# Patient Record
Sex: Female | Born: 1965
Health system: Southern US, Community
[De-identification: ages and names within clinical notes are randomized; demographics above are authoritative.]

## PROBLEM LIST (undated history)

## (undated) DIAGNOSIS — J449 Chronic obstructive pulmonary disease, unspecified: Secondary | ICD-10-CM

## (undated) DIAGNOSIS — K743 Primary biliary cirrhosis: Secondary | ICD-10-CM

## (undated) DIAGNOSIS — I499 Cardiac arrhythmia, unspecified: Secondary | ICD-10-CM

## (undated) DIAGNOSIS — J189 Pneumonia, unspecified organism: Secondary | ICD-10-CM

## (undated) DIAGNOSIS — Z8669 Personal history of other diseases of the nervous system and sense organs: Secondary | ICD-10-CM

## (undated) DIAGNOSIS — E119 Type 2 diabetes mellitus without complications: Secondary | ICD-10-CM

## (undated) DIAGNOSIS — E039 Hypothyroidism, unspecified: Secondary | ICD-10-CM

## (undated) DIAGNOSIS — K000 Anodontia: Secondary | ICD-10-CM

## (undated) DIAGNOSIS — D72819 Decreased white blood cell count, unspecified: Secondary | ICD-10-CM

## (undated) DIAGNOSIS — J849 Interstitial pulmonary disease, unspecified: Secondary | ICD-10-CM

## (undated) DIAGNOSIS — Z9289 Personal history of other medical treatment: Secondary | ICD-10-CM

## (undated) DIAGNOSIS — Z973 Presence of spectacles and contact lenses: Secondary | ICD-10-CM

## (undated) DIAGNOSIS — J302 Other seasonal allergic rhinitis: Secondary | ICD-10-CM

## (undated) DIAGNOSIS — Q393 Congenital stenosis and stricture of esophagus: Secondary | ICD-10-CM

## (undated) DIAGNOSIS — R0602 Shortness of breath: Secondary | ICD-10-CM

## (undated) DIAGNOSIS — F419 Anxiety disorder, unspecified: Secondary | ICD-10-CM

## (undated) DIAGNOSIS — Z87442 Personal history of urinary calculi: Secondary | ICD-10-CM

## (undated) DIAGNOSIS — J9601 Acute respiratory failure with hypoxia: Secondary | ICD-10-CM

## (undated) DIAGNOSIS — M1990 Unspecified osteoarthritis, unspecified site: Secondary | ICD-10-CM

## (undated) DIAGNOSIS — R509 Fever, unspecified: Secondary | ICD-10-CM

## (undated) DIAGNOSIS — N189 Chronic kidney disease, unspecified: Secondary | ICD-10-CM

## (undated) DIAGNOSIS — K08109 Complete loss of teeth, unspecified cause, unspecified class: Secondary | ICD-10-CM

## (undated) DIAGNOSIS — G40909 Epilepsy, unspecified, not intractable, without status epilepticus: Secondary | ICD-10-CM

## (undated) DIAGNOSIS — S0990XA Unspecified injury of head, initial encounter: Secondary | ICD-10-CM

## (undated) DIAGNOSIS — K219 Gastro-esophageal reflux disease without esophagitis: Secondary | ICD-10-CM

## (undated) DIAGNOSIS — R011 Cardiac murmur, unspecified: Secondary | ICD-10-CM

## (undated) DIAGNOSIS — R51 Headache: Secondary | ICD-10-CM

## (undated) DIAGNOSIS — I864 Gastric varices: Secondary | ICD-10-CM

## (undated) HISTORY — PX: ABDOMINAL HYSTERECTOMY: SHX81

## (undated) HISTORY — DX: Gastro-esophageal reflux disease without esophagitis: K21.9

## (undated) HISTORY — DX: Other seasonal allergic rhinitis: J30.2

## (undated) HISTORY — PX: NOVASURE ABLATION: SHX5394

## (undated) HISTORY — DX: Gastric varices: I86.4

## (undated) HISTORY — PX: WISDOM TOOTH EXTRACTION: SHX21

## (undated) HISTORY — DX: Hypothyroidism, unspecified: E03.9

## (undated) HISTORY — DX: Primary biliary cirrhosis: K74.3

## (undated) HISTORY — PX: DILATION AND CURETTAGE OF UTERUS: SHX78

## (undated) HISTORY — DX: Unspecified osteoarthritis, unspecified site: M19.90

## (undated) HISTORY — PX: DENTAL SURGERY: SHX609

## (undated) HISTORY — DX: Chronic obstructive pulmonary disease, unspecified: J44.9

---
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
## 1997-09-07 ENCOUNTER — Ambulatory Visit (HOSPITAL_COMMUNITY): Admission: RE | Admit: 1997-09-07 | Discharge: 1997-09-07 | Payer: Self-pay | Admitting: Obstetrics

## 1997-10-11 ENCOUNTER — Ambulatory Visit (HOSPITAL_COMMUNITY): Admission: RE | Admit: 1997-10-11 | Discharge: 1997-10-11 | Payer: Self-pay | Admitting: Obstetrics

## 1997-10-18 ENCOUNTER — Ambulatory Visit (HOSPITAL_COMMUNITY): Admission: RE | Admit: 1997-10-18 | Discharge: 1997-10-18 | Payer: Self-pay | Admitting: Obstetrics

## 1997-11-07 ENCOUNTER — Ambulatory Visit (HOSPITAL_COMMUNITY): Admission: RE | Admit: 1997-11-07 | Discharge: 1997-11-07 | Payer: Self-pay | Admitting: Obstetrics

## 1997-11-24 ENCOUNTER — Emergency Department (HOSPITAL_COMMUNITY): Admission: EM | Admit: 1997-11-24 | Discharge: 1997-11-24 | Payer: Self-pay | Admitting: Emergency Medicine

## 1997-11-27 ENCOUNTER — Encounter: Admission: RE | Admit: 1997-11-27 | Discharge: 1998-02-25 | Payer: Self-pay | Admitting: Orthopedic Surgery

## 1997-11-29 ENCOUNTER — Inpatient Hospital Stay (HOSPITAL_COMMUNITY): Admission: AD | Admit: 1997-11-29 | Discharge: 1997-11-29 | Payer: Self-pay | Admitting: Obstetrics

## 1998-01-05 ENCOUNTER — Inpatient Hospital Stay (HOSPITAL_COMMUNITY): Admission: AD | Admit: 1998-01-05 | Discharge: 1998-01-05 | Payer: Self-pay | Admitting: Obstetrics

## 1998-02-02 ENCOUNTER — Inpatient Hospital Stay (HOSPITAL_COMMUNITY): Admission: AD | Admit: 1998-02-02 | Discharge: 1998-02-02 | Payer: Self-pay | Admitting: Obstetrics

## 1998-02-12 ENCOUNTER — Inpatient Hospital Stay (HOSPITAL_COMMUNITY): Admission: AD | Admit: 1998-02-12 | Discharge: 1998-02-16 | Payer: Self-pay | Admitting: Obstetrics

## 1998-02-12 ENCOUNTER — Inpatient Hospital Stay (HOSPITAL_COMMUNITY): Admission: AD | Admit: 1998-02-12 | Discharge: 1998-02-12 | Payer: Self-pay | Admitting: Obstetrics

## 1998-05-16 ENCOUNTER — Emergency Department (HOSPITAL_COMMUNITY): Admission: EM | Admit: 1998-05-16 | Discharge: 1998-05-16 | Payer: Self-pay | Admitting: Emergency Medicine

## 1998-05-28 ENCOUNTER — Encounter: Payer: Self-pay | Admitting: Emergency Medicine

## 1998-05-28 ENCOUNTER — Emergency Department (HOSPITAL_COMMUNITY): Admission: EM | Admit: 1998-05-28 | Discharge: 1998-05-28 | Payer: Self-pay | Admitting: Emergency Medicine

## 1998-05-29 ENCOUNTER — Encounter: Payer: Self-pay | Admitting: Emergency Medicine

## 1998-05-29 ENCOUNTER — Ambulatory Visit (HOSPITAL_COMMUNITY): Admission: RE | Admit: 1998-05-29 | Discharge: 1998-05-29 | Payer: Self-pay | Admitting: Emergency Medicine

## 1998-09-07 ENCOUNTER — Emergency Department (HOSPITAL_COMMUNITY): Admission: EM | Admit: 1998-09-07 | Discharge: 1998-09-07 | Payer: Self-pay | Admitting: Emergency Medicine

## 1999-01-04 ENCOUNTER — Emergency Department (HOSPITAL_COMMUNITY): Admission: EM | Admit: 1999-01-04 | Discharge: 1999-01-04 | Payer: Self-pay | Admitting: Emergency Medicine

## 1999-03-13 ENCOUNTER — Other Ambulatory Visit: Admission: RE | Admit: 1999-03-13 | Discharge: 1999-03-13 | Payer: Self-pay | Admitting: Obstetrics

## 1999-03-26 ENCOUNTER — Inpatient Hospital Stay (HOSPITAL_COMMUNITY): Admission: AD | Admit: 1999-03-26 | Discharge: 1999-03-26 | Payer: Self-pay | Admitting: Obstetrics

## 1999-07-07 ENCOUNTER — Other Ambulatory Visit: Admission: RE | Admit: 1999-07-07 | Discharge: 1999-07-07 | Payer: Self-pay | Admitting: Obstetrics

## 1999-09-02 ENCOUNTER — Inpatient Hospital Stay (HOSPITAL_COMMUNITY): Admission: AD | Admit: 1999-09-02 | Discharge: 1999-09-02 | Payer: Self-pay | Admitting: Obstetrics

## 1999-09-08 ENCOUNTER — Emergency Department (HOSPITAL_COMMUNITY): Admission: EM | Admit: 1999-09-08 | Discharge: 1999-09-08 | Payer: Self-pay

## 1999-09-27 ENCOUNTER — Inpatient Hospital Stay (HOSPITAL_COMMUNITY): Admission: AD | Admit: 1999-09-27 | Discharge: 1999-09-27 | Payer: Self-pay | Admitting: Obstetrics

## 1999-10-04 ENCOUNTER — Inpatient Hospital Stay (HOSPITAL_COMMUNITY): Admission: AD | Admit: 1999-10-04 | Discharge: 1999-10-04 | Payer: Self-pay | Admitting: Obstetrics

## 1999-10-06 ENCOUNTER — Inpatient Hospital Stay (HOSPITAL_COMMUNITY): Admission: AD | Admit: 1999-10-06 | Discharge: 1999-10-06 | Payer: Self-pay | Admitting: Obstetrics

## 1999-10-31 ENCOUNTER — Inpatient Hospital Stay (HOSPITAL_COMMUNITY): Admission: AD | Admit: 1999-10-31 | Discharge: 1999-10-31 | Payer: Self-pay | Admitting: Obstetrics

## 1999-11-07 ENCOUNTER — Inpatient Hospital Stay (HOSPITAL_COMMUNITY): Admission: AD | Admit: 1999-11-07 | Discharge: 1999-11-10 | Payer: Self-pay | Admitting: Obstetrics

## 1999-11-07 ENCOUNTER — Encounter (INDEPENDENT_AMBULATORY_CARE_PROVIDER_SITE_OTHER): Payer: Self-pay

## 2000-04-07 ENCOUNTER — Emergency Department (HOSPITAL_COMMUNITY): Admission: EM | Admit: 2000-04-07 | Discharge: 2000-04-07 | Payer: Self-pay

## 2000-06-24 ENCOUNTER — Emergency Department (HOSPITAL_COMMUNITY): Admission: EM | Admit: 2000-06-24 | Discharge: 2000-06-24 | Payer: Self-pay | Admitting: Emergency Medicine

## 2000-10-12 ENCOUNTER — Emergency Department (HOSPITAL_COMMUNITY): Admission: EM | Admit: 2000-10-12 | Discharge: 2000-10-12 | Payer: Self-pay | Admitting: Emergency Medicine

## 2000-12-15 ENCOUNTER — Emergency Department (HOSPITAL_COMMUNITY): Admission: EM | Admit: 2000-12-15 | Discharge: 2000-12-15 | Payer: Self-pay | Admitting: Emergency Medicine

## 2000-12-20 ENCOUNTER — Emergency Department (HOSPITAL_COMMUNITY): Admission: EM | Admit: 2000-12-20 | Discharge: 2000-12-20 | Payer: Self-pay | Admitting: Emergency Medicine

## 2001-03-06 ENCOUNTER — Emergency Department (HOSPITAL_COMMUNITY): Admission: EM | Admit: 2001-03-06 | Discharge: 2001-03-06 | Payer: Self-pay | Admitting: Emergency Medicine

## 2001-04-05 ENCOUNTER — Encounter: Payer: Self-pay | Admitting: Emergency Medicine

## 2001-04-05 ENCOUNTER — Emergency Department (HOSPITAL_COMMUNITY): Admission: EM | Admit: 2001-04-05 | Discharge: 2001-04-05 | Payer: Self-pay | Admitting: Emergency Medicine

## 2001-04-18 ENCOUNTER — Emergency Department (HOSPITAL_COMMUNITY): Admission: EM | Admit: 2001-04-18 | Discharge: 2001-04-18 | Payer: Self-pay | Admitting: Emergency Medicine

## 2001-04-18 ENCOUNTER — Encounter: Payer: Self-pay | Admitting: Emergency Medicine

## 2001-05-11 DIAGNOSIS — J302 Other seasonal allergic rhinitis: Secondary | ICD-10-CM

## 2001-05-11 HISTORY — DX: Other seasonal allergic rhinitis: J30.2

## 2001-10-08 ENCOUNTER — Emergency Department (HOSPITAL_COMMUNITY): Admission: EM | Admit: 2001-10-08 | Discharge: 2001-10-08 | Payer: Self-pay | Admitting: Emergency Medicine

## 2002-03-23 ENCOUNTER — Emergency Department (HOSPITAL_COMMUNITY): Admission: EM | Admit: 2002-03-23 | Discharge: 2002-03-24 | Payer: Self-pay | Admitting: Emergency Medicine

## 2002-06-17 ENCOUNTER — Emergency Department (HOSPITAL_COMMUNITY): Admission: EM | Admit: 2002-06-17 | Discharge: 2002-06-17 | Payer: Self-pay | Admitting: Emergency Medicine

## 2002-06-27 ENCOUNTER — Ambulatory Visit (HOSPITAL_COMMUNITY): Admission: RE | Admit: 2002-06-27 | Discharge: 2002-06-27 | Payer: Self-pay | Admitting: Internal Medicine

## 2002-06-27 ENCOUNTER — Encounter: Payer: Self-pay | Admitting: Internal Medicine

## 2002-08-24 ENCOUNTER — Emergency Department (HOSPITAL_COMMUNITY): Admission: EM | Admit: 2002-08-24 | Discharge: 2002-08-24 | Payer: Self-pay | Admitting: Emergency Medicine

## 2002-10-19 ENCOUNTER — Emergency Department (HOSPITAL_COMMUNITY): Admission: EM | Admit: 2002-10-19 | Discharge: 2002-10-19 | Payer: Self-pay | Admitting: Emergency Medicine

## 2002-10-26 ENCOUNTER — Emergency Department (HOSPITAL_COMMUNITY): Admission: EM | Admit: 2002-10-26 | Discharge: 2002-10-26 | Payer: Self-pay | Admitting: Emergency Medicine

## 2003-02-19 ENCOUNTER — Emergency Department (HOSPITAL_COMMUNITY): Admission: EM | Admit: 2003-02-19 | Discharge: 2003-02-19 | Payer: Self-pay | Admitting: Emergency Medicine

## 2003-06-30 ENCOUNTER — Emergency Department (HOSPITAL_COMMUNITY): Admission: EM | Admit: 2003-06-30 | Discharge: 2003-07-01 | Payer: Self-pay | Admitting: Emergency Medicine

## 2004-01-10 ENCOUNTER — Emergency Department (HOSPITAL_COMMUNITY): Admission: EM | Admit: 2004-01-10 | Discharge: 2004-01-11 | Payer: Self-pay | Admitting: Emergency Medicine

## 2004-02-05 ENCOUNTER — Ambulatory Visit: Payer: Self-pay | Admitting: Internal Medicine

## 2004-02-12 ENCOUNTER — Ambulatory Visit (HOSPITAL_COMMUNITY): Admission: RE | Admit: 2004-02-12 | Discharge: 2004-02-12 | Payer: Self-pay | Admitting: Internal Medicine

## 2004-02-29 ENCOUNTER — Other Ambulatory Visit: Admission: RE | Admit: 2004-02-29 | Discharge: 2004-02-29 | Payer: Self-pay | Admitting: Nurse Practitioner

## 2004-02-29 ENCOUNTER — Ambulatory Visit: Payer: Self-pay | Admitting: Nurse Practitioner

## 2004-03-06 ENCOUNTER — Ambulatory Visit: Payer: Self-pay | Admitting: Nurse Practitioner

## 2004-03-14 ENCOUNTER — Ambulatory Visit (HOSPITAL_COMMUNITY): Admission: RE | Admit: 2004-03-14 | Discharge: 2004-03-14 | Payer: Self-pay | Admitting: Family Medicine

## 2004-04-01 ENCOUNTER — Ambulatory Visit: Payer: Self-pay | Admitting: Nurse Practitioner

## 2004-04-23 ENCOUNTER — Ambulatory Visit: Payer: Self-pay | Admitting: Nurse Practitioner

## 2004-04-26 ENCOUNTER — Emergency Department (HOSPITAL_COMMUNITY): Admission: EM | Admit: 2004-04-26 | Discharge: 2004-04-27 | Payer: Self-pay | Admitting: Emergency Medicine

## 2004-05-13 ENCOUNTER — Emergency Department (HOSPITAL_COMMUNITY): Admission: EM | Admit: 2004-05-13 | Discharge: 2004-05-13 | Payer: Self-pay | Admitting: Emergency Medicine

## 2004-05-17 ENCOUNTER — Emergency Department (HOSPITAL_COMMUNITY): Admission: EM | Admit: 2004-05-17 | Discharge: 2004-05-17 | Payer: Self-pay | Admitting: Emergency Medicine

## 2004-06-16 ENCOUNTER — Ambulatory Visit: Payer: Self-pay | Admitting: Nurse Practitioner

## 2004-07-15 ENCOUNTER — Ambulatory Visit: Payer: Self-pay | Admitting: Nurse Practitioner

## 2004-09-05 ENCOUNTER — Ambulatory Visit: Payer: Self-pay | Admitting: Nurse Practitioner

## 2004-09-06 ENCOUNTER — Emergency Department (HOSPITAL_COMMUNITY): Admission: EM | Admit: 2004-09-06 | Discharge: 2004-09-07 | Payer: Self-pay | Admitting: Emergency Medicine

## 2004-09-18 ENCOUNTER — Ambulatory Visit: Payer: Self-pay | Admitting: Nurse Practitioner

## 2004-11-11 ENCOUNTER — Emergency Department (HOSPITAL_COMMUNITY): Admission: EM | Admit: 2004-11-11 | Discharge: 2004-11-11 | Payer: Self-pay | Admitting: Emergency Medicine

## 2004-11-13 ENCOUNTER — Ambulatory Visit: Payer: Self-pay | Admitting: Nurse Practitioner

## 2005-05-17 ENCOUNTER — Emergency Department (HOSPITAL_COMMUNITY): Admission: EM | Admit: 2005-05-17 | Discharge: 2005-05-18 | Payer: Self-pay | Admitting: Emergency Medicine

## 2005-06-30 ENCOUNTER — Ambulatory Visit (HOSPITAL_COMMUNITY): Admission: RE | Admit: 2005-06-30 | Discharge: 2005-06-30 | Payer: Self-pay | Admitting: Obstetrics

## 2005-07-10 ENCOUNTER — Emergency Department (HOSPITAL_COMMUNITY): Admission: EM | Admit: 2005-07-10 | Discharge: 2005-07-10 | Payer: Self-pay | Admitting: Family Medicine

## 2005-08-21 ENCOUNTER — Ambulatory Visit (HOSPITAL_COMMUNITY): Admission: RE | Admit: 2005-08-21 | Discharge: 2005-08-21 | Payer: Self-pay | Admitting: Obstetrics

## 2005-11-16 ENCOUNTER — Emergency Department (HOSPITAL_COMMUNITY): Admission: EM | Admit: 2005-11-16 | Discharge: 2005-11-16 | Payer: Self-pay | Admitting: *Deleted

## 2006-03-02 ENCOUNTER — Emergency Department (HOSPITAL_COMMUNITY): Admission: EM | Admit: 2006-03-02 | Discharge: 2006-03-02 | Payer: Self-pay | Admitting: Family Medicine

## 2006-03-26 ENCOUNTER — Ambulatory Visit: Payer: Self-pay | Admitting: Family Medicine

## 2006-04-10 ENCOUNTER — Encounter (INDEPENDENT_AMBULATORY_CARE_PROVIDER_SITE_OTHER): Payer: Self-pay | Admitting: *Deleted

## 2006-04-10 LAB — CONVERTED CEMR LAB

## 2006-04-23 ENCOUNTER — Ambulatory Visit: Payer: Self-pay | Admitting: Sports Medicine

## 2006-04-23 ENCOUNTER — Encounter (INDEPENDENT_AMBULATORY_CARE_PROVIDER_SITE_OTHER): Payer: Self-pay | Admitting: Specialist

## 2006-04-23 ENCOUNTER — Ambulatory Visit (HOSPITAL_COMMUNITY): Admission: RE | Admit: 2006-04-23 | Discharge: 2006-04-23 | Payer: Self-pay | Admitting: Family Medicine

## 2006-04-29 ENCOUNTER — Ambulatory Visit: Payer: Self-pay | Admitting: Sports Medicine

## 2006-05-07 ENCOUNTER — Ambulatory Visit: Payer: Self-pay | Admitting: Family Medicine

## 2006-05-11 DIAGNOSIS — E039 Hypothyroidism, unspecified: Secondary | ICD-10-CM

## 2006-05-11 DIAGNOSIS — K219 Gastro-esophageal reflux disease without esophagitis: Secondary | ICD-10-CM

## 2006-05-11 HISTORY — DX: Hypothyroidism, unspecified: E03.9

## 2006-05-11 HISTORY — DX: Gastro-esophageal reflux disease without esophagitis: K21.9

## 2006-07-09 ENCOUNTER — Encounter (INDEPENDENT_AMBULATORY_CARE_PROVIDER_SITE_OTHER): Payer: Self-pay | Admitting: *Deleted

## 2006-07-22 ENCOUNTER — Ambulatory Visit (HOSPITAL_COMMUNITY): Admission: RE | Admit: 2006-07-22 | Discharge: 2006-07-22 | Payer: Self-pay | Admitting: Sports Medicine

## 2006-07-26 ENCOUNTER — Emergency Department (HOSPITAL_COMMUNITY): Admission: EM | Admit: 2006-07-26 | Discharge: 2006-07-26 | Payer: Self-pay | Admitting: Emergency Medicine

## 2006-07-27 ENCOUNTER — Ambulatory Visit: Payer: Self-pay | Admitting: Family Medicine

## 2006-07-28 ENCOUNTER — Telehealth: Payer: Self-pay | Admitting: *Deleted

## 2006-07-30 ENCOUNTER — Ambulatory Visit: Payer: Self-pay | Admitting: Family Medicine

## 2006-07-30 DIAGNOSIS — J984 Other disorders of lung: Secondary | ICD-10-CM

## 2006-08-12 ENCOUNTER — Telehealth: Payer: Self-pay | Admitting: *Deleted

## 2006-08-13 ENCOUNTER — Telehealth (INDEPENDENT_AMBULATORY_CARE_PROVIDER_SITE_OTHER): Payer: Self-pay | Admitting: *Deleted

## 2006-08-17 ENCOUNTER — Ambulatory Visit: Payer: Self-pay | Admitting: Family Medicine

## 2006-08-17 ENCOUNTER — Encounter (INDEPENDENT_AMBULATORY_CARE_PROVIDER_SITE_OTHER): Payer: Self-pay | Admitting: Family Medicine

## 2006-08-25 ENCOUNTER — Telehealth (INDEPENDENT_AMBULATORY_CARE_PROVIDER_SITE_OTHER): Payer: Self-pay | Admitting: *Deleted

## 2006-08-26 ENCOUNTER — Ambulatory Visit: Payer: Self-pay | Admitting: Family Medicine

## 2006-08-30 ENCOUNTER — Telehealth: Payer: Self-pay | Admitting: *Deleted

## 2006-09-01 ENCOUNTER — Ambulatory Visit: Payer: Self-pay | Admitting: Sports Medicine

## 2006-09-04 ENCOUNTER — Emergency Department (HOSPITAL_COMMUNITY): Admission: EM | Admit: 2006-09-04 | Discharge: 2006-09-04 | Payer: Self-pay | Admitting: Emergency Medicine

## 2006-09-06 ENCOUNTER — Encounter (INDEPENDENT_AMBULATORY_CARE_PROVIDER_SITE_OTHER): Payer: Self-pay | Admitting: Family Medicine

## 2006-09-08 ENCOUNTER — Telehealth: Payer: Self-pay | Admitting: *Deleted

## 2006-10-11 ENCOUNTER — Telehealth: Payer: Self-pay | Admitting: *Deleted

## 2006-10-12 ENCOUNTER — Encounter (INDEPENDENT_AMBULATORY_CARE_PROVIDER_SITE_OTHER): Payer: Self-pay | Admitting: Family Medicine

## 2006-10-12 ENCOUNTER — Ambulatory Visit: Payer: Self-pay | Admitting: *Deleted

## 2006-10-12 DIAGNOSIS — E039 Hypothyroidism, unspecified: Secondary | ICD-10-CM

## 2006-10-12 LAB — CONVERTED CEMR LAB: TSH: 10.486 microintl units/mL — ABNORMAL HIGH (ref 0.350–5.50)

## 2006-10-14 ENCOUNTER — Telehealth (INDEPENDENT_AMBULATORY_CARE_PROVIDER_SITE_OTHER): Payer: Self-pay | Admitting: *Deleted

## 2006-10-14 ENCOUNTER — Encounter (INDEPENDENT_AMBULATORY_CARE_PROVIDER_SITE_OTHER): Payer: Self-pay | Admitting: Family Medicine

## 2006-10-21 ENCOUNTER — Telehealth (INDEPENDENT_AMBULATORY_CARE_PROVIDER_SITE_OTHER): Payer: Self-pay | Admitting: Family Medicine

## 2006-10-26 ENCOUNTER — Ambulatory Visit (HOSPITAL_COMMUNITY): Admission: RE | Admit: 2006-10-26 | Discharge: 2006-10-26 | Payer: Self-pay | Admitting: Family Medicine

## 2006-10-26 ENCOUNTER — Telehealth: Payer: Self-pay | Admitting: *Deleted

## 2006-10-27 ENCOUNTER — Telehealth: Payer: Self-pay | Admitting: *Deleted

## 2006-11-01 ENCOUNTER — Telehealth: Payer: Self-pay | Admitting: Family Medicine

## 2006-11-22 ENCOUNTER — Encounter: Payer: Self-pay | Admitting: *Deleted

## 2006-12-02 ENCOUNTER — Ambulatory Visit (HOSPITAL_COMMUNITY): Admission: RE | Admit: 2006-12-02 | Discharge: 2006-12-02 | Payer: Self-pay | Admitting: Obstetrics

## 2006-12-02 ENCOUNTER — Encounter (INDEPENDENT_AMBULATORY_CARE_PROVIDER_SITE_OTHER): Payer: Self-pay | Admitting: Obstetrics

## 2007-02-20 ENCOUNTER — Emergency Department (HOSPITAL_COMMUNITY): Admission: EM | Admit: 2007-02-20 | Discharge: 2007-02-20 | Payer: Self-pay | Admitting: Emergency Medicine

## 2007-02-28 ENCOUNTER — Telehealth: Payer: Self-pay | Admitting: *Deleted

## 2007-03-01 ENCOUNTER — Encounter (INDEPENDENT_AMBULATORY_CARE_PROVIDER_SITE_OTHER): Payer: Self-pay | Admitting: *Deleted

## 2007-03-02 ENCOUNTER — Emergency Department (HOSPITAL_COMMUNITY): Admission: EM | Admit: 2007-03-02 | Discharge: 2007-03-02 | Payer: Self-pay | Admitting: Family Medicine

## 2007-03-30 ENCOUNTER — Ambulatory Visit (HOSPITAL_COMMUNITY): Admission: RE | Admit: 2007-03-30 | Discharge: 2007-03-30 | Payer: Self-pay | Admitting: Family Medicine

## 2007-03-30 ENCOUNTER — Ambulatory Visit: Payer: Self-pay | Admitting: Family Medicine

## 2007-04-18 ENCOUNTER — Encounter: Payer: Self-pay | Admitting: *Deleted

## 2007-04-27 ENCOUNTER — Ambulatory Visit: Payer: Self-pay | Admitting: Family Medicine

## 2007-04-27 ENCOUNTER — Encounter (INDEPENDENT_AMBULATORY_CARE_PROVIDER_SITE_OTHER): Payer: Self-pay | Admitting: Family Medicine

## 2007-04-27 DIAGNOSIS — D649 Anemia, unspecified: Secondary | ICD-10-CM

## 2007-04-28 LAB — CONVERTED CEMR LAB
HCT: 38.2 % (ref 36.0–46.0)
Hemoglobin: 12.6 g/dL (ref 12.0–15.0)
LDL Cholesterol: 138 mg/dL — ABNORMAL HIGH (ref 0–99)
MCHC: 33 g/dL (ref 30.0–36.0)
RDW: 14.1 % (ref 11.5–15.5)
TSH: 15.303 microintl units/mL — ABNORMAL HIGH (ref 0.350–5.50)
Triglycerides: 143 mg/dL (ref <150)

## 2007-04-30 ENCOUNTER — Telehealth (INDEPENDENT_AMBULATORY_CARE_PROVIDER_SITE_OTHER): Payer: Self-pay | Admitting: Family Medicine

## 2007-04-30 ENCOUNTER — Emergency Department (HOSPITAL_COMMUNITY): Admission: EM | Admit: 2007-04-30 | Discharge: 2007-04-30 | Payer: Self-pay | Admitting: Emergency Medicine

## 2007-07-19 ENCOUNTER — Telehealth (INDEPENDENT_AMBULATORY_CARE_PROVIDER_SITE_OTHER): Payer: Self-pay | Admitting: *Deleted

## 2007-07-23 ENCOUNTER — Emergency Department (HOSPITAL_COMMUNITY): Admission: EM | Admit: 2007-07-23 | Discharge: 2007-07-23 | Payer: Self-pay | Admitting: Family Medicine

## 2007-08-10 ENCOUNTER — Emergency Department (HOSPITAL_COMMUNITY): Admission: EM | Admit: 2007-08-10 | Discharge: 2007-08-10 | Payer: Self-pay | Admitting: Emergency Medicine

## 2007-08-16 ENCOUNTER — Ambulatory Visit: Payer: Self-pay | Admitting: Family Medicine

## 2007-08-22 ENCOUNTER — Emergency Department (HOSPITAL_COMMUNITY): Admission: EM | Admit: 2007-08-22 | Discharge: 2007-08-22 | Payer: Self-pay | Admitting: Family Medicine

## 2007-08-24 ENCOUNTER — Telehealth: Payer: Self-pay | Admitting: Family Medicine

## 2007-09-21 ENCOUNTER — Emergency Department (HOSPITAL_COMMUNITY): Admission: EM | Admit: 2007-09-21 | Discharge: 2007-09-21 | Payer: Self-pay | Admitting: Emergency Medicine

## 2007-10-07 ENCOUNTER — Ambulatory Visit: Payer: Self-pay | Admitting: Family Medicine

## 2007-10-07 ENCOUNTER — Encounter: Payer: Self-pay | Admitting: Family Medicine

## 2007-10-11 ENCOUNTER — Encounter: Payer: Self-pay | Admitting: Family Medicine

## 2007-10-13 ENCOUNTER — Telehealth: Payer: Self-pay | Admitting: *Deleted

## 2007-10-18 ENCOUNTER — Ambulatory Visit: Payer: Self-pay | Admitting: Family Medicine

## 2007-10-19 ENCOUNTER — Encounter: Admission: RE | Admit: 2007-10-19 | Discharge: 2007-11-07 | Payer: Self-pay | Admitting: Family Medicine

## 2007-10-20 ENCOUNTER — Ambulatory Visit: Payer: Self-pay | Admitting: Family Medicine

## 2007-10-20 ENCOUNTER — Encounter: Payer: Self-pay | Admitting: Family Medicine

## 2007-10-20 ENCOUNTER — Telehealth: Payer: Self-pay | Admitting: *Deleted

## 2007-11-09 ENCOUNTER — Encounter: Payer: Self-pay | Admitting: Family Medicine

## 2007-11-15 ENCOUNTER — Ambulatory Visit: Payer: Self-pay | Admitting: Family Medicine

## 2007-11-15 DIAGNOSIS — G43909 Migraine, unspecified, not intractable, without status migrainosus: Secondary | ICD-10-CM | POA: Insufficient documentation

## 2007-11-21 ENCOUNTER — Emergency Department (HOSPITAL_COMMUNITY): Admission: EM | Admit: 2007-11-21 | Discharge: 2007-11-21 | Payer: Self-pay | Admitting: Emergency Medicine

## 2007-12-29 ENCOUNTER — Emergency Department (HOSPITAL_COMMUNITY): Admission: EM | Admit: 2007-12-29 | Discharge: 2007-12-29 | Payer: Self-pay | Admitting: Emergency Medicine

## 2008-03-08 ENCOUNTER — Telehealth: Payer: Self-pay | Admitting: *Deleted

## 2008-03-09 ENCOUNTER — Encounter (INDEPENDENT_AMBULATORY_CARE_PROVIDER_SITE_OTHER): Payer: Self-pay | Admitting: Family Medicine

## 2008-03-09 ENCOUNTER — Ambulatory Visit: Payer: Self-pay | Admitting: Family Medicine

## 2008-03-09 LAB — CONVERTED CEMR LAB

## 2008-03-17 ENCOUNTER — Emergency Department (HOSPITAL_COMMUNITY): Admission: EM | Admit: 2008-03-17 | Discharge: 2008-03-17 | Payer: Self-pay | Admitting: Emergency Medicine

## 2008-03-20 ENCOUNTER — Encounter: Payer: Self-pay | Admitting: *Deleted

## 2008-03-29 ENCOUNTER — Telehealth: Payer: Self-pay | Admitting: *Deleted

## 2008-03-30 ENCOUNTER — Ambulatory Visit: Payer: Self-pay | Admitting: Family Medicine

## 2008-06-07 ENCOUNTER — Telehealth: Payer: Self-pay | Admitting: Family Medicine

## 2008-06-29 ENCOUNTER — Ambulatory Visit: Payer: Self-pay | Admitting: Family Medicine

## 2008-07-05 ENCOUNTER — Ambulatory Visit: Payer: Self-pay | Admitting: Family Medicine

## 2008-07-06 ENCOUNTER — Encounter: Admission: RE | Admit: 2008-07-06 | Discharge: 2008-07-06 | Payer: Self-pay | Admitting: Family Medicine

## 2008-07-23 ENCOUNTER — Ambulatory Visit: Payer: Self-pay | Admitting: Family Medicine

## 2008-07-24 ENCOUNTER — Encounter: Payer: Self-pay | Admitting: Family Medicine

## 2008-07-26 ENCOUNTER — Encounter (INDEPENDENT_AMBULATORY_CARE_PROVIDER_SITE_OTHER): Payer: Self-pay | Admitting: Family Medicine

## 2008-07-26 ENCOUNTER — Other Ambulatory Visit: Admission: RE | Admit: 2008-07-26 | Discharge: 2008-07-26 | Payer: Self-pay | Admitting: Family Medicine

## 2008-07-26 ENCOUNTER — Encounter: Payer: Self-pay | Admitting: Family Medicine

## 2008-07-26 ENCOUNTER — Ambulatory Visit: Payer: Self-pay | Admitting: Family Medicine

## 2008-07-26 DIAGNOSIS — F329 Major depressive disorder, single episode, unspecified: Secondary | ICD-10-CM | POA: Insufficient documentation

## 2008-07-26 DIAGNOSIS — F32A Depression, unspecified: Secondary | ICD-10-CM | POA: Insufficient documentation

## 2008-07-26 DIAGNOSIS — M25519 Pain in unspecified shoulder: Secondary | ICD-10-CM | POA: Insufficient documentation

## 2008-07-26 LAB — CONVERTED CEMR LAB: Whiff Test: NEGATIVE

## 2008-07-30 LAB — CONVERTED CEMR LAB

## 2008-08-07 ENCOUNTER — Ambulatory Visit: Payer: Self-pay

## 2008-12-05 ENCOUNTER — Telehealth: Payer: Self-pay | Admitting: Family Medicine

## 2008-12-06 ENCOUNTER — Ambulatory Visit: Payer: Self-pay | Admitting: Family Medicine

## 2008-12-17 ENCOUNTER — Emergency Department (HOSPITAL_COMMUNITY): Admission: EM | Admit: 2008-12-17 | Discharge: 2008-12-18 | Payer: Self-pay | Admitting: Emergency Medicine

## 2008-12-18 ENCOUNTER — Encounter: Payer: Self-pay | Admitting: Family Medicine

## 2008-12-18 DIAGNOSIS — K029 Dental caries, unspecified: Secondary | ICD-10-CM | POA: Insufficient documentation

## 2009-01-11 ENCOUNTER — Telehealth: Payer: Self-pay | Admitting: Family Medicine

## 2009-02-13 ENCOUNTER — Encounter: Payer: Self-pay | Admitting: Family Medicine

## 2009-02-14 ENCOUNTER — Encounter: Payer: Self-pay | Admitting: Family Medicine

## 2009-03-28 ENCOUNTER — Emergency Department (HOSPITAL_COMMUNITY): Admission: EM | Admit: 2009-03-28 | Discharge: 2009-03-28 | Payer: Self-pay | Admitting: Emergency Medicine

## 2009-05-09 ENCOUNTER — Emergency Department (HOSPITAL_COMMUNITY): Admission: EM | Admit: 2009-05-09 | Discharge: 2009-05-09 | Payer: Self-pay | Admitting: Emergency Medicine

## 2009-05-11 HISTORY — PX: SHOULDER ARTHROSCOPY: SHX128

## 2009-06-23 ENCOUNTER — Emergency Department (HOSPITAL_COMMUNITY): Admission: EM | Admit: 2009-06-23 | Discharge: 2009-06-23 | Payer: Self-pay | Admitting: Emergency Medicine

## 2009-06-25 ENCOUNTER — Ambulatory Visit: Payer: Self-pay | Admitting: Family Medicine

## 2009-06-25 ENCOUNTER — Encounter: Payer: Self-pay | Admitting: *Deleted

## 2009-06-25 ENCOUNTER — Encounter: Payer: Self-pay | Admitting: Family Medicine

## 2009-08-10 ENCOUNTER — Ambulatory Visit (HOSPITAL_COMMUNITY): Admission: RE | Admit: 2009-08-10 | Discharge: 2009-08-10 | Payer: Self-pay | Admitting: Sports Medicine

## 2009-08-26 ENCOUNTER — Emergency Department (HOSPITAL_COMMUNITY): Admission: EM | Admit: 2009-08-26 | Discharge: 2009-08-26 | Payer: Self-pay | Admitting: Emergency Medicine

## 2009-10-21 ENCOUNTER — Emergency Department (HOSPITAL_COMMUNITY): Admission: EM | Admit: 2009-10-21 | Discharge: 2009-10-21 | Payer: Self-pay | Admitting: Emergency Medicine

## 2009-10-30 ENCOUNTER — Telehealth: Payer: Self-pay | Admitting: Family Medicine

## 2009-10-30 ENCOUNTER — Ambulatory Visit: Payer: Self-pay | Admitting: Family Medicine

## 2009-11-20 ENCOUNTER — Emergency Department (HOSPITAL_COMMUNITY): Admission: EM | Admit: 2009-11-20 | Discharge: 2009-11-20 | Payer: Self-pay | Admitting: Emergency Medicine

## 2009-11-20 ENCOUNTER — Telehealth: Payer: Self-pay | Admitting: *Deleted

## 2009-12-02 ENCOUNTER — Encounter: Payer: Self-pay | Admitting: Family Medicine

## 2009-12-06 ENCOUNTER — Telehealth: Payer: Self-pay | Admitting: *Deleted

## 2009-12-12 ENCOUNTER — Ambulatory Visit (HOSPITAL_COMMUNITY): Admission: RE | Admit: 2009-12-12 | Discharge: 2009-12-12 | Payer: Self-pay | Admitting: Family Medicine

## 2009-12-12 ENCOUNTER — Ambulatory Visit: Payer: Self-pay | Admitting: Family Medicine

## 2009-12-12 ENCOUNTER — Encounter: Payer: Self-pay | Admitting: Family Medicine

## 2009-12-12 DIAGNOSIS — Z8669 Personal history of other diseases of the nervous system and sense organs: Secondary | ICD-10-CM

## 2009-12-12 DIAGNOSIS — R0602 Shortness of breath: Secondary | ICD-10-CM | POA: Insufficient documentation

## 2009-12-12 HISTORY — DX: Personal history of other diseases of the nervous system and sense organs: Z86.69

## 2009-12-13 DIAGNOSIS — R945 Abnormal results of liver function studies: Secondary | ICD-10-CM

## 2009-12-16 ENCOUNTER — Encounter: Payer: Self-pay | Admitting: Family Medicine

## 2009-12-16 ENCOUNTER — Ambulatory Visit: Payer: Self-pay | Admitting: Family Medicine

## 2009-12-16 ENCOUNTER — Other Ambulatory Visit: Admission: RE | Admit: 2009-12-16 | Discharge: 2009-12-16 | Payer: Self-pay | Admitting: Family Medicine

## 2009-12-20 ENCOUNTER — Encounter: Payer: Self-pay | Admitting: Family Medicine

## 2009-12-20 LAB — CONVERTED CEMR LAB: Pap Smear: NORMAL

## 2010-01-01 ENCOUNTER — Emergency Department (HOSPITAL_COMMUNITY): Admission: EM | Admit: 2010-01-01 | Discharge: 2010-01-01 | Payer: Self-pay | Admitting: Emergency Medicine

## 2010-02-03 ENCOUNTER — Ambulatory Visit (HOSPITAL_BASED_OUTPATIENT_CLINIC_OR_DEPARTMENT_OTHER): Admission: RE | Admit: 2010-02-03 | Discharge: 2010-02-04 | Payer: Self-pay | Admitting: Orthopedic Surgery

## 2010-02-04 ENCOUNTER — Encounter
Admission: RE | Admit: 2010-02-04 | Discharge: 2010-03-14 | Payer: Self-pay | Source: Home / Self Care | Admitting: Orthopedic Surgery

## 2010-05-06 ENCOUNTER — Emergency Department (HOSPITAL_COMMUNITY)
Admission: EM | Admit: 2010-05-06 | Discharge: 2010-05-06 | Payer: Self-pay | Source: Home / Self Care | Admitting: Emergency Medicine

## 2010-06-01 ENCOUNTER — Encounter: Payer: Self-pay | Admitting: Family Medicine

## 2010-06-01 ENCOUNTER — Encounter: Payer: Self-pay | Admitting: Sports Medicine

## 2010-06-08 LAB — CONVERTED CEMR LAB
BUN: 11 mg/dL (ref 6–23)
CO2: 22 meq/L (ref 19–32)
Calcium: 9.1 mg/dL (ref 8.4–10.5)
Chloride: 103 meq/L (ref 96–112)
Creatinine, Ser: 0.73 mg/dL (ref 0.40–1.20)
HCT: 38.2 % (ref 36.0–46.0)
Hemoglobin: 12.9 g/dL (ref 12.0–15.0)
TSH: 13.794 microintl units/mL — ABNORMAL HIGH (ref 0.350–4.500)
WBC: 5.1 10*3/uL (ref 4.0–10.5)

## 2010-06-12 NOTE — Miscellaneous (Signed)
Summary: re: ear drops/ts  pharmacist called re: Auralgan ear drops, they are very expensive. ok to change to Abiotic ear drops. ok'd per Dr.Walden to change.Mauricia Area CMA,  June 25, 2009 11:42 AM  Clinical Lists Changes

## 2010-06-12 NOTE — Letter (Signed)
Summary: Generic Letter  [facility]  [address]   [place], [place] [address]   Phone: [phone]  Fax: [phone]    12/16/2009  Holly Mueller [address] [place], [place]  [address]  To Whom It May Concern:  I am clearning Holly Mueller for shoulder surgery from a cardiac standpoint.  She needs pulmonary clearance from her pulmonologist.  Please see attached visits, EKG and labs.       Sincerely,   Patria Mane  MD  Appended Document: Generic Letter faxed to [facility]

## 2010-06-12 NOTE — Progress Notes (Signed)
Summary: triage   Phone Note Call from Patient Call back at [phone]   Caller: Patient-[name] Summary of Call: is having bad shoulder pain and wants to come in today Initial call taken by: Audie Clear,  November 20, 2009 10:33 AM  Follow-up for Phone Call        R shoulder hurts. hx of this x 1 yr. went to a specialist. was put on nsaids & had a shot. to do home exercises. none are helping. wants to be seen. no appt left for today. she will be here at 8:30am tomorrow Follow-up by: Elige Radon RN,  November 20, 2009 10:57 AM

## 2010-06-12 NOTE — Assessment & Plan Note (Signed)
Summary: F/U HEADACHES/FILL OUT FMLA FORMS/[facility]   Vital Signs:  Patient Profile:   45 Years Old Female Height:     66.25 inches Weight:      257 pounds BMI:     41.32 Temp:     98.5 degrees F Pulse rate:   69 / minute BP sitting:   119 / 79  (right arm)  Pt. in pain?   no    Location:   head  Vitals Entered By: Elray Mcgregor RN (Oct 07, 2007 8:32 AM)              Is Patient Diabetic? 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  She has been using [facility] all day pain relief (naproxen) essentially daily.  headaches occur on >4 days a week.  The pain starts in the back of her neck and then moves around to the front.  she also has tried excedrin migraine and excedrin tension headache which used to work but no longer work for her headache.  sometimes her headache (1-2x a month) becomes severe enough it is disabling from work.  during these severe headaches she does have some phonophobia though no photophobia.  the pain becomes throbbing during these headaches and the only relief is lying down in a dark and quiet room with an ice or heat pack for several hours.  she has no aura with her headaches but does feel the tension as a headaches is coming on. work is a signficant precipitant for her headaches. she has been seen several times for her headaches at the urgent care or at the ed and has been treated with vicodin with some relief though it makes her feel "loopy."   2) chest tightness: describes substernal to left sided chest tightness with shortness of breath recently.  improved with breathing treatment.  is not induced by exercise.  does not radiate. is not associated with nausea or diaphoresis.  was recently seen at her pulmonologist and put on prednisone (?) 4 day course  3) FMLA paperwork: needs  this filled out as she is in danger of losing her job secondry to her headaches     Family History:    No breast cancer    M- cirrhosis died    F- stroke, lung cancer, ? MI    sister died in 07/14/06 secondary to a fall - had headaches from the fall.   Social History:    Never smoked, no alcohol, works at [employer] as Scientist, water quality.    Review of Systems      See HPI   Physical Exam  General:     Well-developed, obese ,in no acute distress; alert,appropriate and cooperative throughout examination Head:     Normocephalic and atraumatic without obvious abnormalities. No apparent alopecia or balding. Eyes:     No corneal or conjunctival inflammation noted. EOMI. Perrla.Vision grossly normal Mouth:     Oral mucosa and oropharynx without lesions or exudates.  Teeth in good repair. Neck:     No deformities, masses, or tenderness noted.supple and full ROM.   Lungs:     Normal respiratory effort, chest expands symmetrically. Lungs are clear to auscultation, no crackles or wheezes. Heart:     Normal rate and regular rhythm. S1 and S2  normal without gallop, murmur, click, rub or other extra sounds. Neurologic:     alert & oriented X3, cranial nerves II-XII intact, strength normal in all extremities, sensation intact to light touch, gait normal, and DTRs symmetrical and normal.   Psych:     good eye contact, not anxious appearing, and depressed affect.      Impression & Recommendations:  Problem # 1:  HEADACHE, TENSION, CHRONIC (ICD-307.81) Assessment: Deteriorated See patient instructions.  Starting amitryptilline to see if this will help with chronic headache.  Also trying to stop OTC meds as there is likely a component of med overuse headache.  I do believe there may be an occasional migraine in her headaches as well.  Will start physical therapy as well to evaluate for any exercises and stretches she may do at work to see if this will help her tension. FMLA paperwork filled  out.  Her updated medication list for this problem includes:    Ultram 50 Mg Tabs (Tramadol hcl) .Marland Kitchen... 1 tablet every 6 hours for pain.    Meloxicam 15 Mg Tabs (Meloxicam) ..... One by mouth daily as needed  Orders: [place]- Est  Level 4 ([address])   Problem # 2:  CHEST PAIN UNSPECIFIED (ICD-786.50) Assessment: Deteriorated appears to be asthma exacerbation.  being managed by pulmonologist.  Orders: [place]- Est  Level 4 ([address])   Problem # 3:  HYPOTHYROIDISM NOS (ICD-244.9) Assessment: Unchanged last checked in december and was not under control.  will check today to see if new dose is effective.  Her updated medication list for this problem includes:    Levothroid 150 Mcg Tabs (Levothyroxine sodium) .Marland Kitchen... 1 tablet a day for thyroid.  note increase in dose.  Orders: [facility] ([address]62) [place]- Est  Level 4 ([address])   Complete Medication List: 1)  Ultram 50 Mg Tabs (Tramadol hcl) .Marland Kitchen.. 1 tablet every 6 hours for pain. 2)  Singulair 10 Mg Tabs (Montelukast sodium) .Marland Kitchen.. 1 tablet a day for allergies/asthma 3)  Zyrtec Hives Relief 10 Mg Tabs (Cetirizine hcl) .Marland Kitchen.. 1 tablet a day 4)  Veramyst 27.5 Mcg/spray Susp (Fluticasone furoate) .Marland Kitchen.. 1 spray into each nostril daily. 5)  Levothroid 150 Mcg Tabs (Levothyroxine sodium) .Marland Kitchen.. 1 tablet a day for thyroid.  note increase in dose. 6)  Ferrous Sulfate 325 (65 Fe) Mg Tbec (Ferrous sulfate) .Marland Kitchen.. 1 tablet by mouth two times a day. 7)  Symbicort 160-4.5 Mcg/act Aero (Budesonide-formoterol fumarate) .... 2 inh bid 8)  Prilosec 40 Mg Cpdr (Omeprazole) .... One tab by mouth bid 9)  Meloxicam 15 Mg Tabs (Meloxicam) .... One by mouth daily as needed 10)  Amitriptyline Hcl 25 Mg Tabs (Amitriptyline hcl) .... Take 1 tablet the first 3 days at night then 2 tablets every night for 3 days then 3 tablets at night every night.   Patient Instructions: 1)  We are going to start a new medication to try to help your headaches that you take every night. We are also  going to have you do some physical therapy to see if we can learn some exercises for work to try to help your headaches. 2)  I want you to stop taking the [facility]S all day pain medicine and try to use the excedrin medicines less than 1-2 times a week. 3)  I would like to see you back in 1 month to see how this medicine is doing for you.     Prescriptions: AMITRIPTYLINE HCL 25 MG  TABS (AMITRIPTYLINE HCL) take 1 tablet  the first 3 days at night then 2 tablets every night for 3 days then 3 tablets at night every night.  #90 x 0   Entered and Authorized by:   Patria Mane  MD   Signed by:   Patria Mane  MD on 10/07/2007   Method used:   [facility] ...       [facility]  [address] [phone]*       [place]       [place], [place]  [address]       Ph: [phone] or [phone]       Fax: [phone]   RxID:   [id number]  ]  Vital Signs:  Patient Profile:   45 Years Old Female Height:     66.25 inches Weight:      257 pounds BMI:     41.32 Temp:     98.5 degrees F Pulse rate:   69 / minute BP sitting:   119 / 79    Location:   head

## 2010-06-12 NOTE — Assessment & Plan Note (Signed)
Summary: sinus per pt/[place]/alm   Vital Signs:  Patient profile:   45 year old female Height:      66.25 inches Weight:      254.9 pounds BMI:     40.98 Temp:     98.3 degrees F oral Pulse rate:   85 / minute BP sitting:   118 / 75  (left arm) Cuff size:   regular  Vitals Entered By: Levert Feinstein LPN (October 30, 7024 3:78 PM) CC: sinus/head congestion x 5 days Is Patient Diabetic? No Pain Assessment Patient in pain? yes     Location: headache   Primary Care Provider:  Patria Mane  MD  CC:  sinus/head congestion x 5 days.  History of Present Illness: Patient here today for c/o sinus pressure, headache, congestion x 5 days.  She denies fever.  She has been taking OTC meds - Sudafed Sinus with minimal relief.  Patient has a h/o Allergic Rhinitis - on Zyrtec, and Asthma - on ProAir and Symbicort.  Review of chart shows patient was on Amoxicillin in 06/2009.    Habits & Providers  Alcohol-Tobacco-Diet     Tobacco Status: never  Allergies: No Known Drug Allergies  Physical Exam  General:  Well-developed,well-nourished,in no acute distress; alert,appropriate and cooperative throughout examination Eyes:  No corneal or conjunctival inflammation noted. EOMI. Perrla. Vision grossly normal. Ears:  External ear exam shows no significant lesions or deformities.  Otoscopic examination reveals clear canals, tympanic membranes are intact bilaterally without bulging, retraction, inflammation or discharge. Hearing is grossly normal bilaterally. Nose:  External nasal examination shows no deformity or inflammation. Nasal mucosa very boggy, turbinates edematous and almost completely occluding nasal passages. Clear rhinorrhea. Mouth:  Oral mucosa and oropharynx without lesions or exudates.  Neck:  No deformities, masses, or tenderness noted. Lungs:  Normal respiratory effort, chest expands symmetrically. Lungs are clear to auscultation, no crackles or wheezes. Heart:  Normal rate and regular  rhythm. S1 and S2 normal without gallop, murmur, click, rub or other extra sounds.   Impression & Recommendations:  Problem # 1:  ACUTE SINUSITIS, UNSPECIFIED (ICD-461.9) Most likely due to uncontrolled allergic rhinitis with some sinus pressure and headache.  Will start nasal steroid to try to decrease inflammation. Rx also given for Augmentin if she becomes febrile or has no improvement in 3-5 days.  Her updated medication list for this problem includes:    Fluticasone Propionate 50 Mcg/act Susp (Fluticasone propionate) ..... One spray each nostril bid    Amoxicillin-pot Clavulanate 500-125 Mg Tabs (Amoxicillin-pot clavulanate) ..... One tab by mouth q12 hours x 10 days  Problem # 2:  ALLERGIC RHINITIS CAUSE UNSPECIFIED (ICD-477.9) Start flonase.   Her updated medication list for this problem includes:    Zyrtec Hives Relief 10 Mg Tabs (Cetirizine hcl) .Marland Kitchen... 1 tablet a day    Veramyst 27.5 Mcg/spray Susp (Fluticasone furoate) .Marland Kitchen... 1 spray into each nostril daily.    Fluticasone Propionate 50 Mcg/act Susp (Fluticasone propionate) ..... One spray each nostril bid  Orders: [place]- Est Level  3 ([address])  Complete Medication List: 1)  Zyrtec Hives Relief 10 Mg Tabs (Cetirizine hcl) .Marland Kitchen.. 1 tablet a day 2)  Veramyst 27.5 Mcg/spray Susp (Fluticasone furoate) .Marland Kitchen.. 1 spray into each nostril daily. 3)  Levothyroxine Sodium 175 Mcg Tabs (Levothyroxine sodium) .Marland Kitchen.. 1 by mouth once daily for low thyroid 4)  Symbicort 160-4.5 Mcg/act Aero (Budesonide-formoterol fumarate) .... 2 inh bid 5)  Prilosec 40 Mg Cpdr (Omeprazole) .... One tab by mouth bid  6)  Amitriptyline Hcl 25 Mg Tabs (Amitriptyline hcl) .... Take 1 tablet the first 3 days at night then 2 tablets every night for 3 days then 3 tablets at night every night. 7)  Proair Hfa 108 (90 Base) Mcg/act Aers (Albuterol sulfate) .... 2 puffs q4 hr as needed 8)  Baclofen 10 Mg Tabs (Baclofen) .Marland Kitchen.. 1 by mouth three times a day as needed headaches 9)   Topiramate 25 Mg Tabs (Topiramate) .... Titrating up to 3 tablets daily 10)  Citalopram Hydrobromide 20 Mg Tabs (Citalopram hydrobromide) .Marland Kitchen.. 1 by mouth daily for 1 week then increase to 2 daily thereafter for depression 11)  Auralgan Soln (Antipyrine-benzocaine-polycos) .... Two drops in ear 2-3 times a day 12)  Amoxicillin 500 Mg Caps (Amoxicillin) .... Take 1 tab in am and in pm with food for 7 days 13)  Fluticasone Propionate 50 Mcg/act Susp (Fluticasone propionate) .... One spray each nostril bid 14)  Amoxicillin-pot Clavulanate 500-125 Mg Tabs (Amoxicillin-pot clavulanate) .... One tab by mouth q12 hours x 10 days Prescriptions: AMOXICILLIN-POT CLAVULANATE 500-125 MG TABS (AMOXICILLIN-POT CLAVULANATE) one tab by mouth q12 hours x 10 days  #20 x 0   Entered by:   Levert Feinstein LPN   Authorized by:   Gates Rigg MD   Signed by:   Levert Feinstein LPN on 16/02/9603   Method used:   Electronically to        [facility] (retail)       [address] [place], [place]  [address]       Ph: [phone]       Fax: [phone]   RxID:   [id number] FLUTICASONE PROPIONATE 50 MCG/ACT SUSP (FLUTICASONE PROPIONATE) one spray each nostril BID  #1 bottle x 4   Entered by:   Levert Feinstein LPN   Authorized by:   Gates Rigg MD   Signed by:   Levert Feinstein LPN on 03/07/2535   Method used:   Electronically to        [facility] (retail)       [address] [place], [place]  [address]       Ph: [phone]       Fax: [phone]   RxID:   [id number] AMOXICILLIN-POT CLAVULANATE 500-125 MG TABS (AMOXICILLIN-POT CLAVULANATE) one tab by mouth q12 hours x 10 days  #20 x 0   Entered and Authorized by:   Gates Rigg MD   Signed by:   Gates Rigg MD on 10/30/2009   Method used:   Printed then faxed to ...       [facility] (retail)       [place], [place]  [address]       Ph: [phone]       Fax: [phone]   RxID:   [id number] FLUTICASONE PROPIONATE 50 MCG/ACT SUSP (FLUTICASONE PROPIONATE) one spray each nostril BID  #1 bottle x 4   Entered and Authorized by:   Gates Rigg MD   Signed by:   Gates Rigg MD on 10/30/2009   Method used:   Printed then faxed to ...       [facility] (retail)  [place], [place]  [address]       Ph: [phone]       Fax: [phone]   RxID:   [id number] AMOXICILLIN-POT CLAVULANATE 500-125 MG TABS (AMOXICILLIN-POT CLAVULANATE) one tab by mouth q12 hours x 10 days  #20 x 0   Entered and Authorized by:   Gates Rigg MD   Signed by:   Gates Rigg MD on 10/30/2009   Method used:   Printed then faxed to ...       [facility] (retail)       [place], [place]  [address]       Ph: [phone]       Fax: [phone]   RxID:   [id number] FLUTICASONE PROPIONATE 50 MCG/ACT SUSP (FLUTICASONE PROPIONATE) one spray each nostril BID  #1 bottle x 4   Entered and Authorized by:   Gates Rigg MD   Signed by:   Gates Rigg MD on 10/30/2009   Method used:   Printed then faxed to ...       [facility] (retail)       [address] [place],   [address]       Ph: [phone]       Fax: [phone]   RxID:   [id number]

## 2010-06-12 NOTE — Miscellaneous (Signed)
Summary: Walk in   Clinical Lists Changes started last thursday with cough, HA, cold symptoms. now ears hurt. has tried many OTCs w/o relief. had been seen in [facility] Sunday & diagnosod with URI. presents here to be seen as she is no better. placed with Dr. Mingo Amber in his 9:30 slot.Elige Radon RN  June 25, 2009 9:34 AM
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
## 2010-06-12 NOTE — Progress Notes (Signed)
Summary: triage   Phone Note Call from Patient Call back at ext [phone]   Caller: Patient Summary of Call: Pt thinks she has a sinus infection and wondering if she can be seen today?  Ask for [facility] when you call. Initial call taken by: Raymond Gurney,  October 30, 2009 10:37 AM  Follow-up for Phone Call        c/o HA, facial pressure, runny nose & head congestion x 5 days.  feels feverish. gets off work at 2. can be here at 2:30 (works at [employer] in [employer]) double booked her into Dr. Amie Critchley clinic Follow-up by: Elige Radon RN,  October 30, 2009 10:42 AM

## 2010-06-12 NOTE — Assessment & Plan Note (Signed)
Summary: flu?/[facility]   Vital Signs:  Patient profile:   45 year old female Height:      66.25 inches Weight:      253.1 pounds BMI:     40.69 Temp:     97.6 degrees F oral Pulse rate:   80 / minute BP sitting:   120 / 80  (left arm) Cuff size:   large  Vitals Entered By: Isabelle Course (June 25, 2009 9:50 AM) CC: C/O body aches,fever,cough X 6 days, earache right ear  since last night Is Patient Diabetic? No Pain Assessment Patient in pain? no      Comments seen in [facility] onSat   Primary Care Provider:  Patria Mane  MD  CC:  C/O body aches, fever, cough X 6 days, and earache right ear  since last night.  History of Present Illness: URI Sx's:  Headaches, nasal congestion, sneezing, cough since Thursday.  Body aches from "top of my head to bottom of my toes" since Thursday as well.  Has been vomiting mucus 5-6 times daily, mostly post-tussive emesis.  No real nausea.  Good by mouth intake.  Muscle aches have been improving, but she otherwise has not improved.  Has tried OTC Robitussin, Mucinex, Dayquil and Nyquil without relief.  For headaches, has been using Ibuprophen multiple times a day with some relief.  Starting last night, patient began experiencing severe RIGHT ear pain.  States it is deep within ear.  No relief with OTC analgesics.    ROS:  no fevers, no chills.  No sore throat.  No lightheaded or vertigo symptoms.  No change in hearing or balance.    Habits & Providers  Alcohol-Tobacco-Diet     Tobacco Status: never  Current Problems (verified): 1)  Headache, Sinus  (ICD-784.0) 2)  Dental Caries  (ICD-521.00) 3)  Uri  (ICD-465.9) 4)  Unspecified Condition of The Tongue  (ICD-529.9) 5)  Back Pain  (ICD-724.5) 6)  Depression  (ICD-311) 7)  Shoulder Pain, Right  (ICD-719.41) 8)  Migraine Headache  (ICD-346.90) 9)  Headache, Tension, Chronic  (ICD-307.81) 10)  Hx of Unspecified Anemia  (ICD-285.9) 11)  Hypothyroidism Nos  (ICD-244.9) 12)  Asthma, Extrinsic Nos   (ICD-493.00) 13)  Screening For Malignant Neoplasm of The Cervix  (ICD-V76.2) 14)  Routine Gynecological Examination  (ICD-V72.31)  Current Medications (verified): 1)  Zyrtec Hives Relief 10 Mg  Tabs (Cetirizine Hcl) .Marland Kitchen.. 1 Tablet A Day 2)  Veramyst 27.5 Mcg/spray  Susp (Fluticasone Furoate) .Marland Kitchen.. 1 Spray Into Each Nostril Daily. 3)  Levothyroxine Sodium 175 Mcg Tabs (Levothyroxine Sodium) .Marland Kitchen.. 1 By Mouth Once Daily For Low Thyroid 4)  Symbicort 160-4.5 Mcg/act  Aero (Budesonide-Formoterol Fumarate) .... 2 Inh Bid 5)  Prilosec 40 Mg  Cpdr (Omeprazole) .... One Tab By Mouth Bid 6)  Amitriptyline Hcl 25 Mg  Tabs (Amitriptyline Hcl) .... Take 1 Tablet The First 3 Days At Night Then 2 Tablets Every Night For 3 Days Then 3 Tablets At Night Every Night. 7)  Proair Hfa 108 (90 Base) Mcg/act Aers (Albuterol Sulfate) .... 2 Puffs Q4 Hr As Needed 8)  Baclofen 10 Mg Tabs (Baclofen) .Marland Kitchen.. 1 By Mouth Three Times A Day As Needed Headaches 9)  Topiramate 25 Mg Tabs (Topiramate) .... Titrating Up To 3 Tablets Daily 10)  Citalopram Hydrobromide 20 Mg Tabs (Citalopram Hydrobromide) .Marland Kitchen.. 1 By Mouth Daily For 1 Week Then Increase To 2 Daily Thereafter For Depression  Allergies (verified): No Known Drug Allergies   Impression & Recommendations:  Problem # 1:  OTITIS MEDIA (ICD-382.9) Assessment New Precepted with Dr. Martinique, who agreed with findings.  Although rare in adults, Ms Mccuistion definitely has evidence of otitis media, including pain, pustular fluid, and blood behind RIGHT TM.  Plan to treat with 7 day course of Amoxicillin.  Most probably a bacterial over viral infection.  Given Auralgan for ear pain relief.   Her updated medication list for this problem includes:    Amoxicillin 500 Mg Caps (Amoxicillin) .Marland Kitchen... Take 1 tab in am and in pm with food for 7 days  Orders: [facility]- Est Level  3 ([address])  Problem # 2:  URI (ICD-465.9) Assessment: Deteriorated Most likely viral URI, though possibly flu secondary  to muscle aches and pains, vomiting.  No fever, but patient has consistently been taking Ibuprophen  since Thursday which may mask febrile symptoms.  If flu, would simply continue supportive care anyway as it has been greater than 3 days since symptoms started.  Patient did receive flu shot this year.  Gave patient red flags as well as work excuse.   Her updated medication list for this problem includes:    Zyrtec Hives Relief 10 Mg Tabs (Cetirizine hcl) .Marland Kitchen... 1 tablet a day  Orders: [facility]- Est Level  3 ([address])  Complete Medication List: 1)  Zyrtec Hives Relief 10 Mg Tabs (Cetirizine hcl) .Marland Kitchen.. 1 tablet a day 2)  Veramyst 27.5 Mcg/spray Susp (Fluticasone furoate) .Marland Kitchen.. 1 spray into each nostril daily. 3)  Levothyroxine Sodium 175 Mcg Tabs (Levothyroxine sodium) .Marland Kitchen.. 1 by mouth once daily for low thyroid 4)  Symbicort 160-4.5 Mcg/act Aero (Budesonide-formoterol fumarate) .... 2 inh bid 5)  Prilosec 40 Mg Cpdr (Omeprazole) .... One tab by mouth bid 6)  Amitriptyline Hcl 25 Mg Tabs (Amitriptyline hcl) .... Take 1 tablet the first 3 days at night then 2 tablets every night for 3 days then 3 tablets at night every night. 7)  Proair Hfa 108 (90 Base) Mcg/act Aers (Albuterol sulfate) .... 2 puffs q4 hr as needed 8)  Baclofen 10 Mg Tabs (Baclofen) .Marland Kitchen.. 1 by mouth three times a day as needed headaches 9)  Topiramate 25 Mg Tabs (Topiramate) .... Titrating up to 3 tablets daily 10)  Citalopram Hydrobromide 20 Mg Tabs (Citalopram hydrobromide) .Marland Kitchen.. 1 by mouth daily for 1 week then increase to 2 daily thereafter for depression 11)  Auralgan Soln (Antipyrine-benzocaine-polycos) .... Two drops in ear 2-3 times a day 12)  Amoxicillin 500 Mg Caps (Amoxicillin) .... Take 1 tab in am and in pm with food for 7 days Prescriptions: AMOXICILLIN 500 MG CAPS (AMOXICILLIN) Take 1 tab in AM and in PM with food for 7 days  #14 x 0   Entered and Authorized by:   Annabell Sabal MD   Signed by:   Annabell Sabal MD on 06/25/2009    Method used:   Print then Give to Patient   RxID:   [id number]  SOLN (ANTIPYRINE-BENZOCAINE-POLYCOS) Two drops in ear 2-3 times a day  #1 x 1   Entered and Authorized by:   Annabell Sabal MD   Signed by:   Annabell Sabal MD on 06/25/2009   Method used:   Print then Give to Patient   RxID:   [id number]

## 2010-06-12 NOTE — Letter (Signed)
Summary: Generic Letter  [facility]  [address]   [place], [place] [address]   Phone: [phone]  Fax: [phone]    12/20/2009  [address] [place], [place]  [address]  Dear [name],  Your recent pap test did not show any signs of cervical cancer but did show some abnormal cells.  This is nothing to worry about at this time but we should repeat your pap test in 1 year instead of in 3.  If you have questions please feel free to contact family practice.     Sincerely,   Patria Mane  MD  Appended Document: Generic Letter mailed

## 2010-06-12 NOTE — Consult Note (Signed)
Summary: [facility]   [facility]   Imported By: Raymond Gurney 12/05/2009 16:27:25  _____________________________________________________________________  External Attachment:    Type:   Image     Comment:   External Document
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
## 2010-06-12 NOTE — Progress Notes (Signed)
Summary: phn msg   Phone Note Call from Patient Call back at Home Phone [phone]   Caller: Patient Summary of Call: Michela Pitcher that there was to be a form that was to come for Holly Mueller and Holly Mueller about her having surgery and it was for a clearance. Initial call taken by: Raymond Gurney,  December 06, 2009 2:48 PM  Follow-up for Phone Call        haven't received form. i will be on the lookout for it.  likely she will need clearance from her asthma specialist as well Follow-up by: Patria Mane  MD,  December 09, 2009 11:47 AM  Additional Follow-up for Phone Call Additional follow up Details #1::        Spoke with pt via phone.  Told her that we have not seen a form so she may have to call them and ask for it to be faxed again.    If it does come in it needs to be placed in Dr. Benay Pillow box.  Pt states that she already has an appt scheduled with her asthma specialist for surgical clearance.   Additional Follow-up by: Elray Mcgregor RN,  December 09, 2009 4:08 PM

## 2010-07-02 ENCOUNTER — Emergency Department (HOSPITAL_COMMUNITY)
Admission: EM | Admit: 2010-07-02 | Discharge: 2010-07-03 | Disposition: A | Discharge status: Home / Self Care | Payer: 59 | Attending: Emergency Medicine | Admitting: Emergency Medicine

## 2010-07-02 ENCOUNTER — Emergency Department (HOSPITAL_COMMUNITY): Payer: 59

## 2010-07-02 DIAGNOSIS — I76 Septic arterial embolism: Secondary | ICD-10-CM | POA: Insufficient documentation

## 2010-07-02 DIAGNOSIS — Z79899 Other long term (current) drug therapy: Secondary | ICD-10-CM | POA: Insufficient documentation

## 2010-07-02 DIAGNOSIS — J45909 Unspecified asthma, uncomplicated: Secondary | ICD-10-CM | POA: Insufficient documentation

## 2010-07-02 DIAGNOSIS — S8990XA Unspecified injury of unspecified lower leg, initial encounter: Secondary | ICD-10-CM | POA: Insufficient documentation

## 2010-07-02 DIAGNOSIS — Y92009 Unspecified place in unspecified non-institutional (private) residence as the place of occurrence of the external cause: Secondary | ICD-10-CM | POA: Insufficient documentation

## 2010-07-02 DIAGNOSIS — M2060 Acquired deformities of toe(s), unspecified, unspecified foot: Secondary | ICD-10-CM | POA: Insufficient documentation

## 2010-07-02 DIAGNOSIS — S91109A Unspecified open wound of unspecified toe(s) without damage to nail, initial encounter: Secondary | ICD-10-CM | POA: Insufficient documentation

## 2010-07-02 DIAGNOSIS — G40909 Epilepsy, unspecified, not intractable, without status epilepticus: Secondary | ICD-10-CM | POA: Insufficient documentation

## 2010-07-02 DIAGNOSIS — R42 Dizziness and giddiness: Secondary | ICD-10-CM | POA: Insufficient documentation

## 2010-07-02 DIAGNOSIS — X58XXXA Exposure to other specified factors, initial encounter: Secondary | ICD-10-CM | POA: Insufficient documentation

## 2010-07-08 ENCOUNTER — Telehealth: Payer: Self-pay | Admitting: Family Medicine

## 2010-07-08 ENCOUNTER — Other Ambulatory Visit: Payer: Self-pay | Admitting: Family Medicine

## 2010-07-08 ENCOUNTER — Ambulatory Visit (INDEPENDENT_AMBULATORY_CARE_PROVIDER_SITE_OTHER): Payer: Commercial Managed Care - PPO | Admitting: Family Medicine

## 2010-07-08 ENCOUNTER — Encounter: Payer: Self-pay | Admitting: Family Medicine

## 2010-07-08 VITALS — BP 122/80 | HR 68 | Temp 98.6°F | Ht 66.5 in | Wt 255.4 lb

## 2010-07-08 DIAGNOSIS — S91109A Unspecified open wound of unspecified toe(s) without damage to nail, initial encounter: Secondary | ICD-10-CM

## 2010-07-08 DIAGNOSIS — E669 Obesity, unspecified: Secondary | ICD-10-CM | POA: Insufficient documentation

## 2010-07-08 DIAGNOSIS — S91209A Unspecified open wound of unspecified toe(s) with damage to nail, initial encounter: Secondary | ICD-10-CM | POA: Insufficient documentation

## 2010-07-08 DIAGNOSIS — E039 Hypothyroidism, unspecified: Secondary | ICD-10-CM

## 2010-07-08 DIAGNOSIS — Z1231 Encounter for screening mammogram for malignant neoplasm of breast: Secondary | ICD-10-CM

## 2010-07-08 LAB — COMPREHENSIVE METABOLIC PANEL
ALT: 59 U/L — ABNORMAL HIGH (ref 0–35)
CO2: 24 mEq/L (ref 19–32)
Creat: 0.7 mg/dL (ref 0.40–1.20)
Total Bilirubin: 0.9 mg/dL (ref 0.3–1.2)

## 2010-07-08 LAB — CONVERTED CEMR LAB
ALT: 59 units/L — ABNORMAL HIGH (ref 0–35)
Albumin: 3.6 g/dL (ref 3.5–5.2)
CO2: 24 meq/L (ref 19–32)
Calcium: 9.2 mg/dL (ref 8.4–10.5)
Chloride: 101 meq/L (ref 96–112)
Creatinine, Ser: 0.7 mg/dL (ref 0.40–1.20)
Potassium: 4 meq/L (ref 3.5–5.3)
TSH: 18.633 microintl units/mL — ABNORMAL HIGH (ref 0.350–4.500)
Total Protein: 8 g/dL (ref 6.0–8.3)

## 2010-07-08 LAB — TSH: TSH: 18.633 u[IU]/mL — ABNORMAL HIGH (ref 0.350–4.500)

## 2010-07-08 NOTE — Patient Instructions (Signed)
Good to see you! For your toenail I want you to make an appointment up front with Dr. Nori Riis in procedure/[facility] to have the right 2nd toenail removed.  I want you to try 662m of motrin three times a day for the next 3 days for your back pain I need to see you again to check you your thyroid in 3-6 months.

## 2010-07-08 NOTE — Assessment & Plan Note (Signed)
Appears cooked, and still attached, not seeming that it will fall off on own, causing pain and limp of pt, will have pt seen in procedure clinic with Dr. Nori Riis to have it removed.

## 2010-07-08 NOTE — Progress Notes (Signed)
  Subjective:    Patient ID: Holly Mueller, female    DOB: [birth year], 45 y.o.   MRN: [id number]  HPI 45 yo female here for   1.  Toenail-  Pt last Thursday tripped over the lease of her dog her toenail started to bleed and had significant pain, pt was seen in ED no fracture noted on x-ray, Pt states the pain is improving and she does not need the hydrocodone as much anymore, pt though states it is catching on her sock and shoes at work and is causing her pain. Pt would like it removed. No more bleeding  2.  Hypothyroidsim- Pt has been taking synthroid and is doing well, pt has not had her thyroid checked for 7 months, no hair loss, weight has been stable same amount of swelling.   3.  Obesity-  Pt states she had been walking some has not been checking her diet recently. Pt states she is not motivated to try any changes at this time.    Review of Systems Denies fever, chill, nausea, vomiting, diarrhea, shortness of breath dyspnea on exertion chest pain     Objective:   Physical Exam     General Appearance:    Alert, cooperative, no distress, appears stated age obese  Head:    Normocephalic, without obvious abnormality, atraumatic  Eyes:    PERRL, conjunctiva/corneas clear, EOM's intact, s        Throat:   Lips, mucosa, and tongue normal; teeth and gums normal  Neck:   Supple, symmetrical, trachea midline, no adenopathy;    thyroid:  no enlargement/tenderness/nodules; no carotid   bruit     Lungs:     Clear to auscultation bilaterally, respirations unlabored      Heart:    Regular rate and rhythm, S1 and S2 normal, no murmur, rub   or gallop mildly distant     Abdomen:     Soft, non-tender, bowel sounds active all four quadrants,    no masses, no organomegaly obese        Extremities:   Extremities normal, atraumatic, no cyanosis trace edema Right 2nd toe does have some mid swelling, toenail is medially displaced some from the bed tender to palpation no bleeding, no erythema.      Pulses:   2+ and symmetric all extremities  Skin:   Skin color, texture, turgor normal, no rashes or lesions     Neurologic:   CNII-XII intact, normal strength, sensation and reflexes    throughout   no    Assessment & Plan:

## 2010-07-08 NOTE — Telephone Encounter (Signed)
Feel pt is doing well and once toe is fixed then will be doing much better, would give her note for one to 2 weeks if she wanted and she can not stand for more than 3 consecutive hours without a 30 minute break sitting or could sit and work if possible. Please call and ask pt if this would be something she would want and if so then I will write it and she can pick it up on Friday.

## 2010-07-08 NOTE — Telephone Encounter (Signed)
Pt was seen today, is suppose to go back to work tomorrow, wants to know if MD is willing to write a note stating pt needs to sit as needed for her back spasms?

## 2010-07-08 NOTE — Telephone Encounter (Signed)
Will forward to MD.  

## 2010-07-08 NOTE — Assessment & Plan Note (Signed)
Pt is due for TSH, pt seems to be doing well pt also due for cholesterol will continue synthroid unless

## 2010-07-08 NOTE — Assessment & Plan Note (Signed)
Overweight, pt not motivated at time to change daily habits, pt though would like to talk about it at future visit and when toe not hurting.

## 2010-07-09 ENCOUNTER — Other Ambulatory Visit: Payer: Self-pay | Admitting: Family Medicine

## 2010-07-09 ENCOUNTER — Encounter: Payer: Self-pay | Admitting: Family Medicine

## 2010-07-09 DIAGNOSIS — E039 Hypothyroidism, unspecified: Secondary | ICD-10-CM

## 2010-07-09 LAB — LDL CHOLESTEROL, DIRECT: Direct LDL: 143 mg/dL — ABNORMAL HIGH

## 2010-07-09 MED ORDER — LEVOTHYROXINE SODIUM 200 MCG PO TABS: 200.0000 ug | ORAL_TABLET | Freq: Every day | ORAL | Status: DC

## 2010-07-09 NOTE — Telephone Encounter (Signed)
Spoke with patient she states she can do her job just needs to take frequent breaks. Patient states she will be by Friday to pick up letter. FYI to MD

## 2010-07-17 ENCOUNTER — Ambulatory Visit (INDEPENDENT_AMBULATORY_CARE_PROVIDER_SITE_OTHER): Payer: Commercial Managed Care - PPO | Admitting: Family Medicine

## 2010-07-17 ENCOUNTER — Ambulatory Visit
Admission: RE | Admit: 2010-07-17 | Discharge: 2010-07-17 | Disposition: A | Discharge status: Home / Self Care | Payer: Commercial Managed Care - PPO | Source: Ambulatory Visit | Attending: *Deleted | Admitting: *Deleted

## 2010-07-17 ENCOUNTER — Encounter: Payer: Self-pay | Admitting: Family Medicine

## 2010-07-17 VITALS — BP 122/80 | HR 67 | Temp 97.8°F | Ht 66.25 in | Wt 255.0 lb

## 2010-07-17 DIAGNOSIS — S90129A Contusion of unspecified lesser toe(s) without damage to nail, initial encounter: Secondary | ICD-10-CM

## 2010-07-17 DIAGNOSIS — Z1231 Encounter for screening mammogram for malignant neoplasm of breast: Secondary | ICD-10-CM

## 2010-07-17 NOTE — Progress Notes (Signed)
  Subjective:    Patient ID: Holly Mueller, female    DOB: [birth year], 45 y.o.   MRN: [id number]  HPI Pt. Is a 45 y/o c/f who suffered trauma to her right 2nd toe 2nd to a leash avulsing the nail when her dog started to run. She has very keratotic nails with hypertrophy and deformity. She has no tenderness, no sign of inflammation or infection. She wants her nail removed because it is causing her irritation when she wears her shoes. Xray of the toes in the ED department was negative for fracture.  Review of Systems No fevers or signs of infection    Objective:   Physical Exam  Musculoskeletal:       Feet:       Right toe nail is hyperkeratotic, deformed. No inflammation, no infection. Evidence of onycomycosis    Procedure Note: 10:30 am 07/17/10  Informed Consent obtained for removal of right 2nd toe nail removal with local anesthetic w/o ablation. Risks, benefits, alternatives explained and understood by pt. Appropriate Time out completed. Using betadine swabs her toe on both sides was cleansed. Using an aseptic technique 1 cc lidocaine was injected into both sides of the toe to numb the digital nerves. 1% w/o epi. After the toe was anesthetized, a hemostat clamp was used and the toe nail was pried from the nail bed. Hemostatis was obtained with local pressure. The wound was dressed with gauze and antibiotic ointment. No complications. Pt. Understood post-procedure care.       Assessment & Plan:  Pt. Is a 45 y/o c/f with partially avulsed right 2nd toe nail 1. Toe nail removal with local anesthetic injection 2. Keep toe bed clean and dry.

## 2010-07-23 ENCOUNTER — Encounter: Payer: Self-pay | Admitting: Family Medicine

## 2010-07-23 ENCOUNTER — Ambulatory Visit (INDEPENDENT_AMBULATORY_CARE_PROVIDER_SITE_OTHER): Payer: Commercial Managed Care - PPO | Admitting: Family Medicine

## 2010-07-23 DIAGNOSIS — M25531 Pain in right wrist: Secondary | ICD-10-CM | POA: Insufficient documentation

## 2010-07-23 DIAGNOSIS — M25539 Pain in unspecified wrist: Secondary | ICD-10-CM

## 2010-07-23 NOTE — Progress Notes (Signed)
  Subjective:    Patient ID: Holly Mueller, female    DOB: 07-06-1965, 45 y.o.   MRN: [id number]  HPI 1. Right wrist pain:  Pt with a hx of right wrist pain about 10 years ago.  She hasn't had any pain since then up until last Friday.  She doesn't remember any injury or inciting event.  Pain located on the radial side of the wrist with no radiation.  Pain was rated a 3/10 but is 0/10 today.  Her friend brought her a wrist brace which she has been using and that has been helping the pain.  She has also been taking Ibuprofen which has helped.   Review of Systems Denies any numbness / tingling / loss of grip strength    Objective:   Physical Exam  Constitutional: No distress.  Cardiovascular: Normal rate and regular rhythm.   Pulmonary/Chest: Effort normal.  Musculoskeletal:       Right wrist:  Non tender to palpation.  No swelling or redness.  Normal ROM.  - DeQuervain's.  - Tinnels.          Assessment & Plan:

## 2010-07-23 NOTE — Assessment & Plan Note (Signed)
No pain today.  Likely mild tendonitis.  Advised her to continue to use the wrist splint as needed.  Take Tylenol / Ibuprofen as needed.  F/U if pain returns.

## 2010-07-28 LAB — CBC
MCHC: 35.6 g/dL (ref 30.0–36.0)
MCV: 89.7 fL (ref 78.0–100.0)
Platelets: 175 10*3/uL (ref 150–400)
RDW: 13.1 % (ref 11.5–15.5)
WBC: 4 10*3/uL (ref 4.0–10.5)

## 2010-07-28 LAB — POCT I-STAT, CHEM 8
HCT: 36 % (ref 36.0–46.0)
Hemoglobin: 12.2 g/dL (ref 12.0–15.0)
Potassium: 4.2 mEq/L (ref 3.5–5.1)
Sodium: 139 mEq/L (ref 135–145)
TCO2: 25 mmol/L (ref 0–100)

## 2010-07-28 LAB — POCT CARDIAC MARKERS
CKMB, poc: 1 ng/mL (ref 1.0–8.0)
Myoglobin, poc: 85.3 ng/mL (ref 12–200)

## 2010-07-28 LAB — DIFFERENTIAL
Basophils Absolute: 0 10*3/uL (ref 0.0–0.1)
Basophils Relative: 1 % (ref 0–1)
Eosinophils Absolute: 0.2 10*3/uL (ref 0.0–0.7)
Neutrophils Relative %: 56 % (ref 43–77)

## 2010-08-17 LAB — DIFFERENTIAL
Basophils Absolute: 0 10*3/uL (ref 0.0–0.1)
Basophils Relative: 1 % (ref 0–1)
Eosinophils Absolute: 0.2 10*3/uL (ref 0.0–0.7)
Monocytes Absolute: 0.4 10*3/uL (ref 0.1–1.0)
Neutro Abs: 3.6 10*3/uL (ref 1.7–7.7)
Neutrophils Relative %: 68 % (ref 43–77)

## 2010-08-17 LAB — CBC
MCHC: 35.7 g/dL (ref 30.0–36.0)
MCV: 90 fL (ref 78.0–100.0)
RDW: 13.1 % (ref 11.5–15.5)

## 2010-08-17 LAB — POCT I-STAT, CHEM 8
Calcium, Ion: 1.22 mmol/L (ref 1.12–1.32)
Chloride: 104 mEq/L (ref 96–112)
Glucose, Bld: 131 mg/dL — ABNORMAL HIGH (ref 70–99)
HCT: 40 % (ref 36.0–46.0)
Hemoglobin: 13.6 g/dL (ref 12.0–15.0)
Potassium: 4.1 mEq/L (ref 3.5–5.1)

## 2010-08-17 LAB — POCT CARDIAC MARKERS: Troponin i, poc: <0.05 ng/mL (ref 0.00–0.09)

## 2010-09-13 ENCOUNTER — Inpatient Hospital Stay (INDEPENDENT_AMBULATORY_CARE_PROVIDER_SITE_OTHER)
Admission: RE | Admit: 2010-09-13 | Discharge: 2010-09-13 | Disposition: A | Discharge status: Home / Self Care | Payer: Commercial Managed Care - PPO | Source: Ambulatory Visit | Attending: Family Medicine | Admitting: Family Medicine

## 2010-09-13 DIAGNOSIS — J45909 Unspecified asthma, uncomplicated: Secondary | ICD-10-CM

## 2010-09-23 NOTE — Op Note (Signed)
NAMEMARCY, SOOKDEO                ACCOUNT NO.:  [id number]   MEDICAL RECORD NO.:  [id number]          PATIENT TYPE:  AMB   LOCATION:  [place]                           FACILITY:  [facility]   PHYSICIAN:  Frederico Hamman, M.D.DATE OF BIRTH:  [birth year]   DATE OF PROCEDURE:  12/02/2006  DATE OF DISCHARGE:                               OPERATIVE REPORT   PREOPERATIVE DIAGNOSIS:  Dysfunctional uterine bleeding.   DESCRIPTION OF PROCEDURE:  Under general anesthesia the patient in  lithotomy position, perineum, vagina prepped and draped, bladder emptied  with straight catheter.  Bimanual exam revealed uterus to be normal  size, negative adnexa.  No prolapse. Speculum placed in the vagina.  Cervix injected with 10 mL of 1% Xylocaine.  Anterior lip of the cervix  grasped with tenaculum and the cervix curetted, small amount of tissue  obtained.  Endometrial cavity sounded to 10 cm. Cervical length was  measured with a Hegar dilator at 5 cm giving [place] a cavity length of 5 cm.  The cervix was dilated to #25 Kennon Rounds and the hysteroscope inserted.  The  cavity was normal except for polyp about 3 o'clock. The hysteroscope  removed.  Sharp curettage performed and the NovaSure device inserted.  The cavity integrity test was passed and the cavity width was 3.5 cm.  Ablation was performed 2 minutes at 96 watts and post ablation  hysteroscopy showed total ablation in the cavity.  Fluid deficit was 20  mL.  The patient tolerated the procedure well.  Taken to the recovery  room in good condition.           ______________________________  Frederico Hamman, M.D.     BAM/MEDQ  D:  12/02/2006  T:  12/02/2006  Job:  [id number]

## 2010-09-26 NOTE — H&P (Signed)
[facility]  Patient:    TAEKO, SCHAFFER                       MRN: [id number] Adm. Date:  [id number] Attending:  Beatrix Fetters                         History and Physical  HISTORY OF PRESENT ILLNESS:   Patient is a 45 year old gravida 2 para 1-0-0-1 who had a previous C section, delivering a 6 pound 13 ounce baby in the past. Her EDC was November 05, 1999 and because of suspected macrosomia, patient underwent ultrasound today for estimated fetal weight, which was stated to be 10 pounds 4 ounces with an AFI of 2.6.  Findings were discussed with the patient, and she elected to have a repeat cesarean section and tubal ligation.  PHYSICAL EXAMINATION:  HEENT:                        Negative.  LUNGS:                        Clear.  HEART:                        Regular rhythm.  ABDOMEN:                      Term-sized uterus with estimated fetal weight of 10 pounds.  BREASTS:                      Negative.  PELVIC:                       Cervix long, closed, with vertex at -3 station.  EXTREMITIES:                  Negative. DD:  11/07/99 TD:  11/08/99 Job: [id number]

## 2010-09-26 NOTE — Op Note (Signed)
[facility]  Patient:    Holly Mueller, Holly Mueller                       MRN: [id number] Proc. Date: 11/07/99 Adm. Date:  [id number] Attending:  Beatrix Fetters                           Operative Report  PREOPERATIVE DIAGNOSIS:       Fetal macrosomia at term.  The patient desires repeat cesarean section.  Multiparity, desiring sterilization.  POSTOPERATIVE DIAGNOSIS:  OPERATION:  SURGEON:                      Frederico Hamman, M.D.  ASSISTANT:  ANESTHESIA:                   Spinal.  ESTIMATED BLOOD LOSS:  DESCRIPTION OF PROCEDURE:     The patient was placed on the operating table in he supine position after the spinal was administered by Juliann Pulse, M.D.  The  abdomen was prepped and draped and the bladder emptied with Foley catheter.  A transverse suprapubic incision made through the old scar, carried down to the rectus fascia.  The fascia was cleaned and incised the length of the incision. The rectus muscles were retracted laterally.  The peritoneum incised longitudinally.  A transverse incision was made in the peritoneum above the bladder and the bladder mobilized inferiorly.  A transverse lower uterine incision was made.  There was  minimal fluid noted and the fluid was clear.  The patient was delivered from the OP position of a female, Apgars 9 and 9 weighing 9 pounds 3 ounces.  The team was in  attendance.  The placenta was posterior and removed manually.  The uterine cavity was cleaned with dry laps.  The uterine incision was closed in one layer with continuous suture of #1 chromic including myometrium and endometrium.  The bladder flap reattached with 2-0 chromic.  The uterus was well contracted.  The left tube was grasped in the midportion with a Babcock clamp and 0 plain suture was placed in the mesosalpinx below the portion of the tube within the clamp and tied and approximately one inch of tube transected.  The  procedure was done in a similar  fashion on the other side.  Sponge, needle, and instrument counts were correct.  The abdomen was closed in layers.  The peritoneum with continuous suture of 0 chromic, the fascia with continuous suture of 0 Dexon, and the skin closed with  subcuticular stitch of 3-0 plain.  Estimated blood loss was 400 cc.  The patient tolerated the procedure well and was taken to the recovery room in good condition.  DD:  11/07/99 TD:  11/08/99 Job: [id number]

## 2010-09-26 NOTE — Discharge Summary (Signed)
[facility]  Patient:    Holly Mueller, Holly Mueller Visit Number: [id number] MRN: [id number]          Service Type: OBS Location: [place] [facility] Attending Physician:  Beatrix Fetters Admit Date:  11/07/1999 Discharge Date: 11/10/1999                             Discharge Summary  HISTORY OF PRESENT ILLNESS:   The patient is a 45 year old, gravida 2, para 1-0-0-1, EDC of November 05, 1999.  She had a previous cesarean section and clinically the patient had a large baby.  An ultrasound was performed on June 29 for estimated fetal weight and it was 10 pounds 4 ounces and AFI was 2.6.  The cervix was long and vertex was -3.  The findings were discussed with the patient and she decided that she wanted a repeat cesarean section and tubal ligation.  HOSPITAL COURSE:              She underwent repeat cesarean section on June 29, and had a female, Apgars 9 and 9, weighing 9 pounds 3 ounces.  Tubal ligation was also performed.  Postoperatively, she did well and was discharged home on the third postoperative day, ambulatory, on a regular diet, on Tylenol No.3 one q.4h. p.r.n. to see me in six weeks.  DISCHARGE DIAGNOSES:          1. Status post repeat low transverse cesarean                                  section.                               2. Fetal macrosomia.                               3. Oligohydramnios at term.  FOLLOW-UP:                    She is to see me in six weeks. Attending Physician:  Beatrix Fetters DD:  11/10/99 TD:  11/11/99 Job: [id number]

## 2011-02-03 LAB — BASIC METABOLIC PANEL
BUN: 11
CO2: 26
Chloride: 105
Creatinine, Ser: 0.65
GFR calc Af Amer: >60

## 2011-02-03 LAB — DIFFERENTIAL
Basophils Relative: 1
Eosinophils Absolute: 0.3
Eosinophils Relative: 6 — ABNORMAL HIGH
Monocytes Relative: 8
Neutrophils Relative %: 58

## 2011-02-03 LAB — CBC
HCT: 37.2
MCHC: 34.3
MCV: 83.9
RBC: 4.44

## 2011-02-17 ENCOUNTER — Ambulatory Visit (INDEPENDENT_AMBULATORY_CARE_PROVIDER_SITE_OTHER): Payer: 59 | Admitting: Family Medicine

## 2011-02-17 ENCOUNTER — Encounter: Payer: Self-pay | Admitting: Family Medicine

## 2011-02-17 VITALS — BP 116/77 | HR 85 | Temp 98.1°F | Wt 253.0 lb

## 2011-02-17 DIAGNOSIS — J45909 Unspecified asthma, uncomplicated: Secondary | ICD-10-CM

## 2011-02-17 MED ORDER — ALBUTEROL SULFATE HFA 108 (90 BASE) MCG/ACT IN AERS: 2.0000 | INHALATION_SPRAY | Freq: Four times a day (QID) | RESPIRATORY_TRACT | Status: DC | PRN

## 2011-02-17 MED ORDER — BENZONATATE 100 MG PO CAPS: 100.0000 mg | ORAL_CAPSULE | Freq: Four times a day (QID) | ORAL | Status: DC | PRN

## 2011-02-17 MED ORDER — BUDESONIDE-FORMOTEROL FUMARATE 160-4.5 MCG/ACT IN AERO: 2.0000 | INHALATION_SPRAY | Freq: Two times a day (BID) | RESPIRATORY_TRACT | Status: DC

## 2011-02-17 NOTE — Assessment & Plan Note (Addendum)
Asthma exacerbation likely 2/2 to viral URI.  Will refill meds. Pulse ox is 97.  Continue current treatment and symptomatic relief with tessalon perles.  Gave red flags for return

## 2011-02-17 NOTE — Patient Instructions (Signed)
I am sorry you are feeling poorly Please continue the albuterol as needed and the symbicort ( I have refilled this) Please pick up tessalon perles to help with cough.  If you develop fevers or just feel worse, come back and see [place] in a week. Common Cold, Adult An upper respiratory tract infection, or cold, is a viral infection of the air passages to the lung. Colds are contagious, especially during the first 3 or 4 days. Antibiotics cannot cure a cold. Cold germs are spread by coughs, sneezes, and hand to hand contact. A respiratory tract infection usually clears up in a few days, but some people may be sick for a week or two. HOME CARE INSTRUCTIONS  Only take over-the-counter or prescription medicines for pain, discomfort, or fever as directed by your caregiver.   Be careful not to blow your nose too hard. This may cause a nosebleed.   Use a cool-mist humidifier (vaporizer) to increase air moisture. This will make it easier for you to breath. Do not use hot steam.   Rest as much as possible and get plenty of sleep.   Wash your hands often, especially after you blow your nose. Cover your mouth and nose with a tissue when you sneeze or cough.   Drink at least 8 glasses of clear liquids every day, such as water, fruit juices, tea, clear soups, and carbonated beverages.  SEEK MEDICAL CARE IF:  An oral temperature above 101 lasts 4 days or more, and is not controlled by medication.   You have a sore throat that gets worse or you see white or yellow spots in your throat.   Your cough gets worse or lasts more than 10 days.   You have a rash somewhere on your skin. You have large and tender lumps in your neck.   You have an earache or a headache.   You have thick, greenish or yellowish discharge from your nose.   You cough-up thick yellow, green, gray or bloody mucus (secretions).  SEEK IMMEDIATE MEDICAL CARE IF: You have trouble breathing, chest pain, or your skin or nails look gray or  blue. MAKE SURE YOU:    Understand these instructions.   Will watch your condition.   Will get help right away if you are not doing well or get worse.  Document Released: 04/24/2000 Document Re-Released: 04/09/2008 [facility] Patient Information 2011 [place].

## 2011-02-17 NOTE — Progress Notes (Signed)
  Subjective:    Patient ID: Holly Mueller, female    DOB: 1965/11/11, 45 y.o.   MRN: [id number]  HPI  Pt with 4-5 days of cough and SOB.  She feels that her asthma has flared.  She also has worse runny nose.  She denies fever.  She has some tightness in the center of her chest that gets worse with coughing.  This is keeping her up at night.  She is using her albuterol 6x a day with some relief.  Review of Systems No fever, N/V/D    Objective:   Physical Exam  Vital signs reviewed General appearance - alert, well appearing, and in no distress and oriented to person, place, and time Heart - normal rate, regular rhythm, normal S1, S2, no murmurs, rubs, clicks or gallops Chest - clear to auscultation, no wheezes, rales or rhonchi, symmetric air entry, no tachypnea, retractions or cyanosis (pt did take albuterol right before her appt) Extremities - peripheral pulses normal, no pedal edema, no clubbing or cyanosis       Assessment & Plan:

## 2011-02-23 LAB — CBC
HCT: 32.7 — ABNORMAL LOW
MCHC: 34.4
MCV: 78.1
Platelets: 262
RDW: 15.4 — ABNORMAL HIGH
WBC: 4.4

## 2011-04-24 ENCOUNTER — Ambulatory Visit (INDEPENDENT_AMBULATORY_CARE_PROVIDER_SITE_OTHER): Payer: 59 | Admitting: Family Medicine

## 2011-04-24 ENCOUNTER — Encounter: Payer: Self-pay | Admitting: Family Medicine

## 2011-04-24 VITALS — BP 114/80 | HR 60 | Temp 98.1°F | Ht 66.25 in

## 2011-04-24 DIAGNOSIS — E039 Hypothyroidism, unspecified: Secondary | ICD-10-CM

## 2011-04-24 DIAGNOSIS — G43909 Migraine, unspecified, not intractable, without status migrainosus: Secondary | ICD-10-CM

## 2011-04-24 LAB — BASIC METABOLIC PANEL
Calcium: 8.9 mg/dL (ref 8.4–10.5)
Potassium: 4.3 mEq/L (ref 3.5–5.3)
Sodium: 136 mEq/L (ref 135–145)

## 2011-04-24 LAB — CBC
Hemoglobin: 12.8 g/dL (ref 12.0–15.0)
MCH: 29.4 pg (ref 26.0–34.0)
MCHC: 33 g/dL (ref 30.0–36.0)
Platelets: 157 10*3/uL (ref 150–400)
RDW: 13.7 % (ref 11.5–15.5)

## 2011-04-24 MED ORDER — TOPIRAMATE 25 MG PO TABS: 25.0000 mg | ORAL_TABLET | Freq: Every day | ORAL | Status: DC

## 2011-04-24 MED ORDER — KETOROLAC TROMETHAMINE 60 MG/2ML IM SOLN
60.0000 mg | Freq: Once | INTRAMUSCULAR | Status: AC
  Administered 2011-04-24: 60 mg via INTRAMUSCULAR

## 2011-04-24 NOTE — Progress Notes (Signed)
S:  Pt is here for evaluation of headaches. Pt states that this has been a longstanding issue. Pt states that she has seen the headache center for this the past. Pt states that she receive subq "head" injections that were minimally effective. Pt states that she has had a flare of her headaches over the last month. Pt describes HAs as starting in neck and occipital region with radiation to to temporal and frontal regions. No LOV, no temporal tenderness. No hemiparesis. No associated aura. Pt states that HA is sometimes relieved with laying down in dark room. Pt states that she has been using ibuprofen with mild relief in sxs. Pt states that has borrowed some of her roomates narcotic medications and those have been effective in adressing pain.  Pt states that she has been under general stress lately because of holidays. Pt states though, that HAs have been on a daily basis.       O:  Current outpatient prescriptions:albuterol (PROVENTIL HFA;VENTOLIN HFA) 108 (90 BASE) MCG/ACT inhaler, Inhale 2 puffs into the lungs every 6 (six) hours as needed for wheezing. Please dispense with spacer, Disp: 1 Inhaler, Rfl: 3;  benzonatate (TESSALON PERLES) 100 MG capsule, Take 1 capsule (100 mg total) by mouth every 6 (six) hours as needed for cough., Disp: 30 capsule, Rfl: 0 budesonide-formoterol (SYMBICORT) 160-4.5 MCG/ACT inhaler, Inhale 2 puffs into the lungs 2 (two) times daily., Disp: 1 Inhaler, Rfl: 6;  esomeprazole (NEXIUM) 20 MG capsule, Take 20 mg by mouth daily before breakfast.  , Disp: , Rfl: ;  levothyroxine (SYNTHROID, LEVOTHROID) 200 MCG tablet, Take 1 tablet (200 mcg total) by mouth daily., Disp: 90 tablet, Rfl: 1 topiramate (TOPAMAX) 25 MG tablet, Take 1 tablet (25 mg total) by mouth at bedtime., Disp: 30 tablet, Rfl: 2  Wt Readings from Last 3 Encounters:  02/17/11 253 lb (114.76 kg)  07/23/10 256 lb (116.121 kg)  07/17/10 255 lb (115.667 kg)   Temp Readings from Last 3 Encounters:  04/24/11 98.1  F (36.7 C) Oral  02/17/11 98.1 F (36.7 C) Oral  07/23/10 98.5 F (36.9 C) Oral   BP Readings from Last 3 Encounters:  04/24/11 114/80  02/17/11 116/77  07/23/10 122/88   Pulse Readings from Last 3 Encounters:  04/24/11 60  02/17/11 85  07/23/10 68    General: alert, cooperative and moderately obese HEENT: PERRLA and extra ocular movement intact; dental caries  Heart: S1, S2 normal, no murmur, rub or gallop, regular rate and rhythm Lungs: clear to auscultation, no wheezes or rales and unlabored breathing Abdomen: obese abdomen, non tender Extremities: extremities normal, atraumatic, no cyanosis or edema Skin:no rashes Neurology: normal without focal findings, mental status, speech normal, alert and oriented x3, PERLA and reflexes normal and symmetric   A/P:

## 2011-04-24 NOTE — Assessment & Plan Note (Addendum)
Will treat with toradol today. Wil also start on topamax for migraine ppx. Discussed with pt at length importance of not using prescibed medications from other people as this is both dangerous and illegal. Pt expressed understanding of this. Instructed pt to use ibuprofen high strength as well as high dose tylenol for persistent migraines. Instructed pt to follow with me in 2-4 weeks for a follow up visit. Considered addition of beta-blocker as primary ppx, however, this is contraindicated given asthma and baseline near bradycardc HR.

## 2011-04-24 NOTE — Patient Instructions (Signed)
Migraine Headache A migraine headache is an intense, throbbing pain on one or both sides of your head. The exact cause of a migraine headache is not always known. A migraine may be caused when nerves in the brain become irritated and release chemicals that cause swelling within blood vessels, causing pain. Many migraine sufferers have a family history of migraines. Before you get a migraine you may or may not get an aura. An aura is a group of symptoms that can predict the beginning of a migraine. An aura may include:  Visual changes such as:   Flashing lights.   Bright spots or zig-zag lines.   Tunnel vision.   Feelings of numbness.   Trouble talking.   Muscle weakness.  SYMPTOMS  Pain on one or both sides of your head.   Pain that is pulsating or throbbing in nature.   Pain that is severe enough to prevent daily activities.   Pain that is aggravated by any daily physical activity.   Nausea (feeling sick to your stomach), vomiting, or both.   Pain with exposure to bright lights, loud noises, or activity.   General sensitivity to bright lights or loud noises.  MIGRAINE TRIGGERS Examples of triggers of migraine headaches include:   Alcohol.   Smoking.   Stress.   It may be related to menses (female menstruation).   Aged cheeses.   Foods or drinks that contain nitrates, glutamate, aspartame, or tyramine.   Lack of sleep.   Chocolate.   Caffeine.   Hunger.   Medications such as nitroglycerine (used to treat chest pain), birth control pills, estrogen, and some blood pressure medications.  DIAGNOSIS  A migraine headache is often diagnosed based on:  Symptoms.   Physical examination.   A computerized X-ray scan (computed tomography, CT) of your head.  TREATMENT  Medications can help prevent migraines if they are recurrent or should they become recurrent. Your caregiver can help you with a medication or treatment program that will be helpful to you.   Lying  down in a dark, quiet room may be helpful.   Keeping a headache diary may help you find a trend as to what may be triggering your headaches.  SEEK IMMEDIATE MEDICAL CARE IF:   You have confusion, personality changes or seizures.   You have headaches that wake you from sleep.   You have an increased frequency in your headaches.   You have a stiff neck.   You have a loss of vision.   You have muscle weakness.   You start losing your balance or have trouble walking.   You feel faint or pass out.  MAKE SURE YOU:   Understand these instructions.   Will watch your condition.   Will get help right away if you are not doing well or get worse.  Document Released: 04/27/2005 Document Revised: 01/07/2011 Document Reviewed: 12/11/2008 [facility] Patient Information 2012 [place].

## 2011-04-24 NOTE — Progress Notes (Deleted)
Patient ID: Holly Mueller, female   DOB: [birth year], 45 y.o.   MRN: [id number]

## 2011-04-25 LAB — TSH: TSH: 13.197 u[IU]/mL — ABNORMAL HIGH (ref 0.350–4.500)

## 2011-05-14 ENCOUNTER — Ambulatory Visit: Payer: 59 | Admitting: Family Medicine

## 2011-05-26 ENCOUNTER — Ambulatory Visit (INDEPENDENT_AMBULATORY_CARE_PROVIDER_SITE_OTHER): Payer: 59 | Admitting: Family Medicine

## 2011-05-26 ENCOUNTER — Encounter: Payer: Self-pay | Admitting: Family Medicine

## 2011-05-26 DIAGNOSIS — G43909 Migraine, unspecified, not intractable, without status migrainosus: Secondary | ICD-10-CM

## 2011-05-26 DIAGNOSIS — G4440 Drug-induced headache, not elsewhere classified, not intractable: Secondary | ICD-10-CM

## 2011-05-26 MED ORDER — TOPIRAMATE 25 MG PO TABS: ORAL_TABLET | ORAL | Status: DC

## 2011-05-26 MED ORDER — TOPIRAMATE 100 MG PO TABS: 100.0000 mg | ORAL_TABLET | Freq: Every day | ORAL | Status: DC

## 2011-05-26 MED ORDER — ESOMEPRAZOLE MAGNESIUM 20 MG PO CPDR: 20.0000 mg | DELAYED_RELEASE_CAPSULE | Freq: Every day | ORAL | Status: DC

## 2011-05-26 MED ORDER — NAPROXEN SODIUM 550 MG PO TABS: ORAL_TABLET | ORAL | Status: DC

## 2011-05-26 MED ORDER — TOPIRAMATE 100 MG PO TABS: 100.0000 mg | ORAL_TABLET | Freq: Two times a day (BID) | ORAL | Status: DC

## 2011-05-26 NOTE — Patient Instructions (Signed)
Please STOP the ibuprofen and the tylenol Start naproxen twice a day for 1 week and once a day for 1 week Increase the topamax to 2 pills (3m) at night for one week, then 3 pills (75) for one week, then 4 pills (100) I will refill that rx and increase the dose to 1071mat night  Come see me in one month Keep a record of your headaches and what triggers them.

## 2011-05-26 NOTE — Assessment & Plan Note (Signed)
Likely initial migraines now medication overuse headaches. No red flags, 90 for CT scan today. Will stop ibuprofen and Tylenol. Will give naproxen 550 twice a day for a week and then once a day for a week. Patient to increase Topamax from 25-100 over the next 3 weeks. May to be rereferred to headache clinic.

## 2011-05-26 NOTE — Progress Notes (Signed)
  Subjective:    Patient ID: Holly Mueller, female    DOB: 29-Apr-1966, 46 y.o.   MRN: [id number]  HPI Patient presents today for followup of headaches. She states her headaches are getting more frequent. She says that she takes Tylenol 2 tablets and ibuprofen 5 tablets every 6 hours. She is taking Topamax 25 mg at night. She states that these headaches are severe they're not her worst headache ever. She says that she feels like there is a lot of pressure on one side of head or the other. At home she will go to sleep with these headaches. At work she has trouble with headaches because she is unable to do anything. She has been seen by the headache clinic in the past, however she felt that they were too expensive and that the injections were also too expensive and did not work very well. She has not noticed any dizziness, loss of strength, change in vision.   Review of Systems Denies CP, SOB,N/V/D, fever     Objective:   Physical Exam  Vital signs reviewed General appearance - alert, well appearing, and in no distress and oriented to person, place, and time Neuro- negative Romberg, full-strength all 4 extremities, cranial nerves 2 through 12 intact, normal speech      Assessment & Plan:

## 2011-05-27 ENCOUNTER — Encounter: Payer: Self-pay | Admitting: Family Medicine

## 2011-05-27 ENCOUNTER — Ambulatory Visit (INDEPENDENT_AMBULATORY_CARE_PROVIDER_SITE_OTHER): Payer: 59 | Admitting: Family Medicine

## 2011-05-27 VITALS — BP 114/77 | HR 64 | Temp 98.2°F | Ht 66.25 in | Wt 257.0 lb

## 2011-05-27 DIAGNOSIS — K029 Dental caries, unspecified: Secondary | ICD-10-CM

## 2011-05-27 MED ORDER — AMOXICILLIN 500 MG PO CAPS: 500.0000 mg | ORAL_CAPSULE | Freq: Three times a day (TID) | ORAL | Status: AC

## 2011-05-27 NOTE — Progress Notes (Signed)
  Subjective:    Patient ID: Holly Mueller, female    DOB: 1965/06/11, 46 y.o.   MRN: [id number]  HPI  Tooth Pain Started last night when was eating.  Teeth top and bottom are tender.  No fever no broken teeth.  Review of Systems  PMH - history of dental caries     Objective:   Physical Exam no apparent distress Mandibular and maxillary teeth on R are tender but not focally.  Does have moderate diffuse dental decay.  No swelling of gums or discharge Neck:  No deformities, thyromegaly, masses, or tenderness noted.   Supple with full range of motion without pain. Face - no tenderness or redness externally       Assessment & Plan:

## 2011-05-27 NOTE — Patient Instructions (Signed)
I think your pain is due to inflammation or infection in your teeth  Take the amoxicllin three times daily for one week  If you get high fever or severe pain you may need to go to the ER  You need to see your dentist ASAP

## 2011-05-27 NOTE — Assessment & Plan Note (Signed)
Likely mild diffuse infection without focal abscess or fracture.  Treat with antibiotics and recommend see her dentist

## 2011-06-23 ENCOUNTER — Ambulatory Visit (INDEPENDENT_AMBULATORY_CARE_PROVIDER_SITE_OTHER): Payer: 59 | Admitting: Family Medicine

## 2011-06-23 ENCOUNTER — Encounter: Payer: Self-pay | Admitting: Family Medicine

## 2011-06-23 VITALS — BP 110/78 | HR 58 | Temp 97.8°F | Ht 66.0 in | Wt 253.0 lb

## 2011-06-23 DIAGNOSIS — G43909 Migraine, unspecified, not intractable, without status migrainosus: Secondary | ICD-10-CM

## 2011-06-23 DIAGNOSIS — T50904A Poisoning by unspecified drugs, medicaments and biological substances, undetermined, initial encounter: Secondary | ICD-10-CM

## 2011-06-23 DIAGNOSIS — G4440 Drug-induced headache, not elsewhere classified, not intractable: Secondary | ICD-10-CM

## 2011-06-23 MED ORDER — NAPROXEN SODIUM 550 MG PO TABS: ORAL_TABLET | ORAL | Status: DC

## 2011-06-23 NOTE — Assessment & Plan Note (Signed)
Patient is having fewer migraine type headaches on the topiramate. We'll continue topiramate at 100 mg per day.

## 2011-06-23 NOTE — Assessment & Plan Note (Signed)
Patient was unable to follow the taper guidelines for the naproxen. She continues to take ibuprofen when necessary. She continues to take more than 10-15 tablets per day. I would like her to be seen at the headache clinic for further help with her medication overuse headache. I think that she may have some medical literacy issues as well. She brought her instructions from her last visit and could read them to me but did not understand how to use them. I will give her naproxen to use 1 tablet when necessary bad headache. I have asked her again to stop using ibuprofen.

## 2011-06-23 NOTE — Patient Instructions (Signed)
I have put in a referral for the headache clinic I would like you to take the Topamax just the 100 mg pills Stopped taking the 25 mg Topamax I will give you a rx for naproxen to take when you have a big headache

## 2011-06-23 NOTE — Progress Notes (Signed)
  Subjective:    Patient ID: Holly Mueller, female    DOB: [birth year], 46 y.o.   MRN: [id number]  HPI Headache-patient presents today for followup of her migraine headaches as well as for medication overuse headaches. No she brought her instructions with her, she did not understand the instructions are given to her. She's been taking both 100 mg tablet of Topiramate as well as four 25 mg tablets. She took the naproxen tablets as needed for headache and use them all within 2 weeks. She continued to take Motrin, 10-15 tablets per day for her daily headaches. She states that though she is still having daily headaches her bad headaches are far less frequent. She had one last Friday and that was the only one she had last week.  There has been no change in the character of her headaches. She denies changes in vision, changes in sensation or weakness.  Review of Systems see above    Objective:   Physical Exam  Vital signs reviewed General appearance - alert, well appearing, and in no distress and oriented to person, place, and time Heart - normal rate, regular rhythm, normal S1, S2, no murmurs, rubs, clicks or gallops Neurological - alert, oriented, normal speech, no focal findings or movement disorder noted, screening mental status exam normal, neck supple without rigidity, cranial nerves II through XII intact       Assessment & Plan:

## 2011-06-24 ENCOUNTER — Telehealth: Payer: Self-pay | Admitting: Family Medicine

## 2011-06-24 NOTE — Telephone Encounter (Signed)
LMOVM informing pt..............................Marland KitchenChristen Bame, CMA

## 2011-06-24 NOTE — Telephone Encounter (Signed)
Called to say that patient needs to call their billing office before they can schedule an appt - she has been a patient there before and has a past balance.

## 2011-07-01 ENCOUNTER — Telehealth: Payer: Self-pay | Admitting: Family Medicine

## 2011-07-01 NOTE — Telephone Encounter (Signed)
See previous phone note that was still open. Holly Mueller, [name]

## 2011-07-01 NOTE — Telephone Encounter (Signed)
LMOVM for pt to call back.  Please see previous phone note, pt will have to call headache/wellness center. Lisaann Atha, [name]

## 2011-07-01 NOTE — Telephone Encounter (Signed)
Patient is returning [place] call.  She said to ask for [facility] at ext. [phone].

## 2011-07-01 NOTE — Telephone Encounter (Signed)
Is asking about referral to neurology - hasn't heard anything yet.

## 2011-07-01 NOTE — Telephone Encounter (Signed)
Spoke with pt and informed that she will need to call.  Pt agreeable. Rheda Kassab, [name]

## 2011-08-19 ENCOUNTER — Encounter (HOSPITAL_COMMUNITY): Payer: Self-pay | Admitting: *Deleted

## 2011-08-19 ENCOUNTER — Emergency Department (HOSPITAL_COMMUNITY)
Admission: EM | Admit: 2011-08-19 | Discharge: 2011-08-19 | Disposition: A | Discharge status: Home / Self Care | Payer: 59 | Attending: Emergency Medicine | Admitting: Emergency Medicine

## 2011-08-19 ENCOUNTER — Other Ambulatory Visit: Payer: Self-pay

## 2011-08-19 DIAGNOSIS — R42 Dizziness and giddiness: Secondary | ICD-10-CM | POA: Insufficient documentation

## 2011-08-19 DIAGNOSIS — R002 Palpitations: Secondary | ICD-10-CM | POA: Insufficient documentation

## 2011-08-19 DIAGNOSIS — J45901 Unspecified asthma with (acute) exacerbation: Secondary | ICD-10-CM | POA: Insufficient documentation

## 2011-08-19 DIAGNOSIS — Z79899 Other long term (current) drug therapy: Secondary | ICD-10-CM | POA: Insufficient documentation

## 2011-08-19 HISTORY — DX: Epilepsy, unspecified, not intractable, without status epilepticus: G40.909

## 2011-08-19 LAB — POCT I-STAT, CHEM 8
BUN: 11 mg/dL (ref 6–23)
Calcium, Ion: 1.29 mmol/L (ref 1.12–1.32)
Hemoglobin: 12.2 g/dL (ref 12.0–15.0)
Sodium: 139 mEq/L (ref 135–145)
TCO2: 23 mmol/L (ref 0–100)

## 2011-08-19 MED ORDER — PREDNISONE 20 MG PO TABS
60.0000 mg | ORAL_TABLET | Freq: Once | ORAL | Status: AC
  Administered 2011-08-19: 60 mg via ORAL
  Filled 2011-08-19: qty 3

## 2011-08-19 MED ORDER — ALBUTEROL SULFATE (5 MG/ML) 0.5% IN NEBU
INHALATION_SOLUTION | RESPIRATORY_TRACT | Status: AC
  Administered 2011-08-19: 5 mg
  Filled 2011-08-19: qty 1

## 2011-08-19 MED ORDER — IPRATROPIUM BROMIDE 0.02 % IN SOLN
RESPIRATORY_TRACT | Status: AC
  Administered 2011-08-19: 0.5 mg
  Filled 2011-08-19: qty 2.5

## 2011-08-19 MED ORDER — ALBUTEROL SULFATE HFA 108 (90 BASE) MCG/ACT IN AERS
2.0000 | INHALATION_SPRAY | Freq: Once | RESPIRATORY_TRACT | Status: AC
  Administered 2011-08-19: 2 via RESPIRATORY_TRACT
  Filled 2011-08-19: qty 6.7

## 2011-08-19 NOTE — Progress Notes (Signed)
Patients peak flow is 365 which is 100.5% of predicted for her age and height.  Patients FEV1 is 1.75 which is 58.5% of predicted for her age and height.  Patient is wheezing.  RT gave initial treatment per protocol and will reassess after treatment.

## 2011-08-19 NOTE — ED Notes (Signed)
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

## 2011-08-19 NOTE — ED Provider Notes (Signed)
History     CSN: [id number]  Arrival date & time 08/19/11  1222   First MD Initiated Contact with Patient 08/19/11 1234      Chief Complaint  Patient presents with  . Shortness of Breath  . Heat Exposure    overheated    (Consider location/radiation/quality/duration/timing/severity/associated sxs/prior treatment) Patient is a 46 y.o. female presenting with shortness of breath. The history is provided by the patient.  Shortness of Breath  The current episode started today. Associated symptoms include shortness of breath. Pertinent negatives include no chest pain and no fever.  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 States shortly afterwards, she began having shortness of breath, unable to get enough air, wheezing. States felt like her heart was racing as well. Pt was brought to ED. She was given neb tx for wheezing, she has hx of asthma. By the time I am seeing her, she no longer has any symptoms. Pt denies chest pain, headache, fever, chills, nausea, vomiting, abdominal pain.  Past Medical History  Diagnosis Date  . Asthma   . Epilepsy in 1972    Past Surgical History  Procedure Date  . Novasure ablation   . Arthorscopic   . C section     x2    History reviewed. No pertinent family history.  History  Substance Use Topics  . Smoking status: Never Smoker   . Smokeless tobacco: Never Used  . Alcohol Use: 1.5 oz/week    3 drink(s) per week    OB History    Grav Para Term Preterm Abortions TAB SAB Ect Mult Living                  Review of Systems  Constitutional: Negative for fever and chills.  HENT: Negative.   Eyes: Negative.   Respiratory: Positive for shortness of breath.   Cardiovascular: Positive for palpitations. Negative for chest pain.  Gastrointestinal: Negative.   Genitourinary: Negative.   Musculoskeletal: Negative.   Skin: Negative.   Neurological: Positive for light-headedness.  Negative for syncope, weakness and headaches.  Psychiatric/Behavioral: Negative.     Allergies  Aspirin free childs  Home Medications   Current Outpatient Rx  Name Route Sig Dispense Refill  . ALBUTEROL SULFATE HFA 108 (90 BASE) MCG/ACT IN AERS Inhalation Inhale 2 puffs into the lungs every 6 (six) hours as needed for wheezing. Please dispense with spacer 1 Inhaler 3  . BUDESONIDE-FORMOTEROL FUMARATE 160-4.5 MCG/ACT IN AERO Inhalation Inhale 2 puffs into the lungs 2 (two) times daily. 1 Inhaler 6  . ESOMEPRAZOLE MAGNESIUM 20 MG PO CPDR Oral Take 1 capsule (20 mg total) by mouth daily before breakfast. 30 capsule 11  . LEVOTHYROXINE SODIUM 200 MCG PO TABS Oral Take 1 tablet (200 mcg total) by mouth daily. 90 tablet 1  . NAPROXEN SODIUM 550 MG PO TABS Oral Take 550 mg by mouth daily as needed. For headache    . TOPIRAMATE 100 MG PO TABS Oral Take 1 tablet (100 mg total) by mouth daily. 30 tablet 11    BP 116/77  Pulse 62  Temp(Src) 97.7 F (36.5 C) (Oral)  Resp 20  SpO2 98%  Physical Exam  Nursing note and vitals reviewed. Constitutional: She is oriented to person, place, and time. She appears well-developed and well-nourished.  HENT:  Head: Normocephalic.  Eyes: Conjunctivae are normal.  Neck: Neck supple.  Cardiovascular: Normal rate, regular rhythm and normal heart  sounds.   Pulmonary/Chest: Effort normal and breath sounds normal. No respiratory distress. She has no wheezes. She has no rales. She exhibits no tenderness.  Abdominal: Soft. Bowel sounds are normal. There is no tenderness.  Musculoskeletal: She exhibits no edema.  Neurological: She is alert and oriented to person, place, and time.  Skin: Skin is warm and dry.  Psychiatric: She has a normal mood and affect.    ED Course  Procedures (including critical care time)  Pt is feeling back to normal now. States "I just got really hot, and it triggered my asthma."  Pt has normal VS here. She was seen by respiratory  therapy, nromal peak flows, one neb given, wheezing resolved. Pt denies CP. Will get ECG, will get istat.   Results for orders placed during the hospital encounter of 08/19/11  POCT I-STAT, CHEM 8      Component Value Range   Sodium 139  135 - 145 (mEq/L)   Potassium 4.6  3.5 - 5.1 (mEq/L)   Chloride 109  96 - 112 (mEq/L)   BUN 11  6 - 23 (mg/dL)   Creatinine, Ser 0.90  0.50 - 1.10 (mg/dL)   Glucose, Bld 83  70 - 99 (mg/dL)   Calcium, Ion 1.29  1.12 - 1.32 (mmol/L)   TCO2 23  0 - 100 (mmol/L)   Hemoglobin 12.2  12.0 - 15.0 (g/dL)   HCT 36.0  36.0 - 46.0 (%)    Date: 08/19/2011  Rate: 59  Rhythm: normal sinus rhythm  QRS Axis: normal  Intervals: normal  ST/T Wave abnormalities: normal  Conduction Disutrbances:none  Narrative Interpretation:   Old EKG Reviewed: unchanged   1. Asthma attack       MDM     Pt continues to feel well, lungs are clear. She continues to think she just got overheated, states this has happened before. Doubt ACS, ECG normal, VS normal, low risk for cardiac disease, doubt pe, PERC negative. Will d/c home. Pt also stated she ran out of all her inhalers. Will follow up closely with pcp.       Renold Genta, PA 08/19/11 1556

## 2011-08-19 NOTE — Discharge Instructions (Signed)
Your ECG, blood test were normal today. I think you may have had asthma exacerbation. Take albuterol inhaler two puffs every 4 hrs for the next 3 days. Take it easy today. Rest. Follow up with your doctor for recheck in 3 days. Return if worsening.   Asthma, Acute Bronchospasm Your exam shows you have asthma, or acute bronchospasm that acts like asthma. Bronchospasm means your air passages become narrowed. These conditions are due to inflammation and airway spasm that cause narrowing of the bronchial tubes in the lungs. This causes you to have wheezing and shortness of breath. CAUSES  Respiratory infections and allergies most often bring on these attacks. Smoking, air pollution, cold air, emotional upsets, and vigorous exercise can also bring them on.  TREATMENT   Treatment is aimed at making the narrowed airways larger. Mild asthma/bronchospasm is usually controlled with inhaled medicines. Albuterol is a common medicine that you breathe in to open spastic or narrowed airways. Some trade names for albuterol are Ventolin or Proventil. Steroid medicine is also used to reduce the inflammation when an attack is moderate or severe. Antibiotics (medications used to kill germs) are only used if a bacterial infection is present.   If you are pregnant and need to use Albuterol (Ventolin or Proventil), you can expect the baby to move more than usual shortly after the medicine is used.  HOME CARE INSTRUCTIONS   Rest.   Drink plenty of liquids. This helps the mucus to remain thin and easily coughed up. Do not use caffeine or alcohol.   Do not smoke. Avoid being exposed to second-hand smoke.   You play a critical role in keeping yourself in good health. Avoid exposure to things that cause you to wheeze. Avoid exposure to things that cause you to have breathing problems. Keep your medications up-to-date and available. Carefully follow your doctor's treatment plan.   When pollen or pollution is bad, keep  windows closed and use an air conditioner go to places with air conditioning. If you are allergic to furry pets or birds, find new homes for them or keep them outside.   Take your medicine exactly as prescribed.   Asthma requires careful medical attention. See your caregiver for follow-up as advised. If you are more than [redacted] weeks pregnant and you were prescribed any new medications, let your Obstetrician know about the visit and how you are doing. Arrange a recheck.  SEEK IMMEDIATE MEDICAL CARE IF:   You are getting worse.   You have trouble breathing. If severe, call 911.   You develop chest pain or discomfort.   You are throwing up or not drinking fluids.   You are not getting better within 24 hours.   You are coughing up yellow, green, brown, or bloody sputum.   You develop a fever over 102 F (38.9 C).   You have trouble swallowing.  MAKE SURE YOU:   Understand these instructions.   Will watch your condition.   Will get help right away if you are not doing well or get worse.  Document Released: 08/12/2006 Document Revised: 04/16/2011 Document Reviewed: 04/11/2007 [facility] Patient Information 2012 [place].

## 2011-08-21 NOTE — ED Provider Notes (Signed)
Medical screening examination/treatment/procedure(s) were performed by non-physician practitioner and as supervising physician I was immediately available for consultation/collaboration.  Leota Jacobsen, MD 08/21/11 [phone]

## 2011-08-26 ENCOUNTER — Encounter: Payer: Self-pay | Admitting: Family Medicine

## 2011-08-26 ENCOUNTER — Ambulatory Visit (INDEPENDENT_AMBULATORY_CARE_PROVIDER_SITE_OTHER): Payer: 59 | Admitting: Family Medicine

## 2011-08-26 VITALS — BP 143/87 | HR 72 | Temp 98.0°F | Ht 66.0 in | Wt 253.0 lb

## 2011-08-26 DIAGNOSIS — J45909 Unspecified asthma, uncomplicated: Secondary | ICD-10-CM

## 2011-08-26 DIAGNOSIS — Z23 Encounter for immunization: Secondary | ICD-10-CM

## 2011-08-26 DIAGNOSIS — E039 Hypothyroidism, unspecified: Secondary | ICD-10-CM

## 2011-08-26 DIAGNOSIS — J309 Allergic rhinitis, unspecified: Secondary | ICD-10-CM

## 2011-08-26 DIAGNOSIS — E669 Obesity, unspecified: Secondary | ICD-10-CM

## 2011-08-26 MED ORDER — FLUTICASONE PROPIONATE 50 MCG/ACT NA SUSP: 2.0000 | Freq: Every day | NASAL | Status: DC

## 2011-08-26 NOTE — Progress Notes (Signed)
  Subjective:    Patient ID: Holly Mueller, female    DOB: 1966/04/20, 46 y.o.   MRN: [id number]  HPI Asthma-patient has begun having more frequent asthma attacks at work. 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 She is unable to catch her breath until she is sad down, hold off, and use her albuterol inhaler. She does not think it is related to anxiety. She says that she is able to do physical activity at home where it is cool without anytrouble. she states that the pollen seems to make it worse. She does have some itchy nose, watery eyes for which she takes an allergy pill from the pharmacy.  She states she has also had a cough that is nonproductive.    last week patient had an asthma attack at work and she had to be taken to the ER. She states that by the time she was seen in the ER, her asthma attack was much improved. She states that her asthma has gotten worse because she ran out Symbicort and albuterol. They gave her albuterol and Symbicort at the ER, and she's been doing better. She still is having trouble at work because of the heat in the kitchen.  She is still using her albuterol every day. Review of Systems  denies fevers, chest pain. Denies nausea, vomiting, diarrhea     Objective:   Physical Exam  Vital signs reviewed General appearance - alert, well appearing, and in no distress Heart - normal rate, regular rhythm, normal S1, S2, no murmurs, rubs, clicks or gallops Chest - clear to auscultation, no wheezes, rales or rhonchi, symmetric air entry, no tachypnea, retractions or cyanosis Extremities - peripheral pulses normal, no pedal edema, no clubbing or cyanosis       Assessment & Plan:

## 2011-08-26 NOTE — Assessment & Plan Note (Signed)
Patient with allergy symptoms that are aggravating her asthma. Will start Flonase today. The patient reports that she is on a medicine that sounds like Zyrtec. She is to bring that in next time.

## 2011-08-26 NOTE — Assessment & Plan Note (Signed)
Lab Results  Component Value Date   TSH 13.197* 04/24/2011   Due for recheck. Will recheck today.

## 2011-08-26 NOTE — Patient Instructions (Signed)
Please make an appointment to see Dr. Valentina Lucks for PFTs. Come see me the week after you see Dr. Valentina Lucks  Make sure you take your Symbicort everyday twice a day. Make sure you take your allergy medicine every day

## 2011-08-26 NOTE — Assessment & Plan Note (Signed)
Deteriorated. Patient is having exacerbations when she gets hot. I am concerned that hot flashes may be playing a part in this. We will have her continue on her restart simbacort and albuterol. She is to come in next week for PFTs. She had a pulmonologist but does not want to the co-pay is to go see him. I will see how will she will do on this course of management and consider referring her back to pulmonologist if needed.

## 2011-08-27 LAB — COMPREHENSIVE METABOLIC PANEL
ALT: 45 U/L — ABNORMAL HIGH (ref 0–35)
AST: 47 U/L — ABNORMAL HIGH (ref 0–37)
BUN: 12 mg/dL (ref 6–23)
CO2: 21 mEq/L (ref 19–32)
Calcium: 8.7 mg/dL (ref 8.4–10.5)
Chloride: 107 mEq/L (ref 96–112)
Creat: 0.65 mg/dL (ref 0.50–1.10)
Total Bilirubin: 0.8 mg/dL (ref 0.3–1.2)

## 2011-08-27 LAB — TSH: TSH: 7.969 u[IU]/mL — ABNORMAL HIGH (ref 0.350–4.500)

## 2011-08-27 LAB — LDL CHOLESTEROL, DIRECT: Direct LDL: 121 mg/dL — ABNORMAL HIGH

## 2011-08-28 ENCOUNTER — Ambulatory Visit: Payer: 59 | Admitting: Pharmacist

## 2011-09-22 ENCOUNTER — Ambulatory Visit: Payer: 59 | Admitting: Pharmacist

## 2011-10-30 ENCOUNTER — Ambulatory Visit (INDEPENDENT_AMBULATORY_CARE_PROVIDER_SITE_OTHER): Payer: 59 | Admitting: Family Medicine

## 2011-10-30 ENCOUNTER — Encounter: Payer: Self-pay | Admitting: Family Medicine

## 2011-10-30 VITALS — BP 130/81 | HR 82 | Temp 98.2°F | Ht 66.0 in | Wt 255.0 lb

## 2011-10-30 DIAGNOSIS — R7989 Other specified abnormal findings of blood chemistry: Secondary | ICD-10-CM

## 2011-10-30 DIAGNOSIS — M25539 Pain in unspecified wrist: Secondary | ICD-10-CM

## 2011-10-30 DIAGNOSIS — G8929 Other chronic pain: Secondary | ICD-10-CM

## 2011-10-30 MED ORDER — MELOXICAM 15 MG PO TABS: 15.0000 mg | ORAL_TABLET | Freq: Every day | ORAL | Status: DC

## 2011-10-30 NOTE — Assessment & Plan Note (Signed)
Likely osteoarthritis. Will send for x-ray of the right wrist since this was worse. Also has ganglion cyst on the right wrist. Since a ganglion cyst is nontender, we'll not do anything with it at this time. We'll try Mobic for pain.

## 2011-10-30 NOTE — Patient Instructions (Addendum)
I would like you to try the Mobic for your wrist pain. I would like you to get x-rays of your wrist done and I will call you when I have the results Please come back and be your new doctor in a few months

## 2011-10-30 NOTE — Progress Notes (Signed)
  Subjective:    Patient ID: Holly Mueller, female    DOB: 1966/04/18, 46 y.o.   MRN: [id number]  HPI Patient presents for evaluation of wrist pain. For the last several years, she has had aching in both wrists. The right wrist is worse the last week. She is not have any swelling in these risks and she's not had any loss of motion. She doesn't have fevers, chills. Arthritis runs in her family and she thinks she has arthritis in her wrists. Patient has also noticed a lump on her wrist. This has been there for approximately one year. It does not seem to be growing and is not tender.   Review of Systems See above    Objective:   Physical Exam Vital signs reviewed General appearance - alert, well appearing, and in no distress MSK-bilateral wrists without tenderness to palpation. No swelling bilaterally. Full range of motion. Bilateral hands with full range of motion, without any joint swelling. No synovial thickening. There is a firm mobile, nontender mass on the palmar side of the wrist.       Assessment & Plan:

## 2011-11-02 ENCOUNTER — Encounter: Payer: Self-pay | Admitting: Family Medicine

## 2011-11-02 LAB — HEPATITIS PANEL, ACUTE
HCV Ab: NEGATIVE
Hep A IgM: NEGATIVE
Hepatitis B Surface Ag: NEGATIVE

## 2012-01-02 ENCOUNTER — Encounter (HOSPITAL_COMMUNITY): Payer: Self-pay | Admitting: *Deleted

## 2012-01-02 ENCOUNTER — Emergency Department (HOSPITAL_COMMUNITY): Payer: PRIVATE HEALTH INSURANCE

## 2012-01-02 ENCOUNTER — Emergency Department (HOSPITAL_COMMUNITY)
Admission: EM | Admit: 2012-01-02 | Discharge: 2012-01-02 | Disposition: A | Discharge status: Home / Self Care | Payer: PRIVATE HEALTH INSURANCE | Attending: Emergency Medicine | Admitting: Emergency Medicine

## 2012-01-02 DIAGNOSIS — W19XXXA Unspecified fall, initial encounter: Secondary | ICD-10-CM

## 2012-01-02 DIAGNOSIS — M25569 Pain in unspecified knee: Secondary | ICD-10-CM | POA: Insufficient documentation

## 2012-01-02 DIAGNOSIS — M25519 Pain in unspecified shoulder: Secondary | ICD-10-CM | POA: Insufficient documentation

## 2012-01-02 DIAGNOSIS — W010XXA Fall on same level from slipping, tripping and stumbling without subsequent striking against object, initial encounter: Secondary | ICD-10-CM | POA: Insufficient documentation

## 2012-01-02 MED ORDER — IBUPROFEN 400 MG PO TABS
800.0000 mg | ORAL_TABLET | Freq: Once | ORAL | Status: AC
  Administered 2012-01-02: 800 mg via ORAL
  Filled 2012-01-02: qty 2

## 2012-01-02 NOTE — Progress Notes (Signed)
Orthopedic Tech Progress Note Patient Details:  Holly Mueller Jul 25, 1965 [id number]  Ortho Devices Type of Ortho Device: Knee Sleeve Ortho Device/Splint Location: (R) LE Ortho Device/Splint Interventions: Application   Braulio Bosch 01/02/2012, 8:49 PM

## 2012-01-02 NOTE — ED Provider Notes (Signed)
History  This chart was scribed for Holly Arthurs, MD by Kevan Rosebush. This patient was seen in room TR05C/TR05C and the patient's care was started at 19:20.   CSN: [id number]  Arrival date & time 01/02/12  1702   None     Chief Complaint  Patient presents with  . Fall    (Consider location/radiation/quality/duration/timing/severity/associated sxs/prior treatment) The history is provided by the patient. No language interpreter was used.  ALEKSIS Mueller is a 46 y.o. female who presents to the Emergency Department complaining of a fall around 4 pm this afternoon. Pt reports she tripped on a wet floor and fell forward onto her right side. Pt reports she hit her head but had no LOC. Pt is now experiencing pain but no numbness diffusely on her right side, but most especially in her right knee and shoulder. Pt is not on any blood thinners and didn't take anything for pain PTA. Pt has a h/o asthma and epilepsy but does not take any medication for it.  Pt reports she has NKDA but doesn't take aspirin because it hurts her stomach.  Past Medical History  Diagnosis Date  . Asthma   . Epilepsy in 1972  . Epilepsia     Past Surgical History  Procedure Date  . Novasure ablation   . Arthorscopic   . C section     x2    No family history on file.  History  Substance Use Topics  . Smoking status: Never Smoker   . Smokeless tobacco: Never Used  . Alcohol Use: 1.5 oz/week    3 drink(s) per week    OB History    Grav Para Term Preterm Abortions TAB SAB Ect Mult Living                  Review of Systems  Constitutional: Negative for fever and chills.  Respiratory: Negative for shortness of breath.   Gastrointestinal: Negative for nausea, vomiting and abdominal pain.  Musculoskeletal: Positive for back pain and arthralgias.       Right knee, shoulder, and wrist pain.   Neurological: Negative for syncope, weakness and numbness.    Allergies  Aspirin free childs  Home  Medications   Current Outpatient Rx  Name Route Sig Dispense Refill  . ALBUTEROL SULFATE HFA 108 (90 BASE) MCG/ACT IN AERS Inhalation Inhale 2 puffs into the lungs every 6 (six) hours as needed for wheezing. Please dispense with spacer 1 Inhaler 3  . BUDESONIDE-FORMOTEROL FUMARATE 160-4.5 MCG/ACT IN AERO Inhalation Inhale 2 puffs into the lungs 2 (two) times daily. 1 Inhaler 6  . ESOMEPRAZOLE MAGNESIUM 20 MG PO CPDR Oral Take 1 capsule (20 mg total) by mouth daily before breakfast. 30 capsule 11  . FLUTICASONE PROPIONATE 50 MCG/ACT NA SUSP Nasal Place 2 sprays into the nose daily. 16 g 6  . LEVOTHYROXINE SODIUM 200 MCG PO TABS Oral Take 1 tablet (200 mcg total) by mouth daily. 90 tablet 1  . MELOXICAM 15 MG PO TABS Oral Take 1 tablet (15 mg total) by mouth daily. 30 tablet 3  . TOPIRAMATE 100 MG PO TABS Oral Take 1 tablet (100 mg total) by mouth daily. 30 tablet 11    BP 117/78  Pulse 79  Temp 97.9 F (36.6 C) (Oral)  Resp 22  SpO2 96%  Physical Exam  Nursing note and vitals reviewed. Constitutional: She is oriented to person, place, and time. She appears well-developed and well-nourished. No distress.  HENT:  Head:  Normocephalic and atraumatic.       Autraumatic head, no scalp hematoma.  Eyes: EOM are normal.  Neck: Normal range of motion. Neck supple. No tracheal deviation present.       No cervical tenderness.  Cardiovascular: Normal rate.   Pulmonary/Chest: Effort normal. No respiratory distress.  Abdominal: Soft. She exhibits no distension. There is no tenderness.  Musculoskeletal: Normal range of motion. She exhibits tenderness. She exhibits no edema.       Right knee is slightly tender over the patella but has normal ROM.  Neurological: She is alert and oriented to person, place, and time.  Skin: Skin is warm and dry.  Psychiatric: She has a normal mood and affect. Her behavior is normal.    ED Course  Procedures (including critical care time) DIAGNOSTIC  STUDIES: Oxygen Saturation is 96% on room air, adequate by my interpretation.    COORDINATION OF CARE: 19:20--I evaluated the patient and we discussed a treatment plan including x-rays and pain medication to which the pt agreed.    Labs Reviewed - No data to display No results found.   1. Fall       MDM  Holly Mueller is a 46 y.o. female s/p fall at 4pm today. Mild tenderness over R shoulder and R knee. Felt better after motrin. Xrays pending, signed out to Baker Janus, NP.    This document was completed by the scribe at my direction and I have reviewed its accuracy. I have personally examined the patient and agrees with the above document.   Shirlyn Goltz, MD     Holly Arthurs, MD 01/02/12 2007

## 2012-01-02 NOTE — ED Notes (Signed)
Fell on a wet floor; landed on rt. Side, hurting from head to rt. Ankle.

## 2012-01-25 ENCOUNTER — Emergency Department (HOSPITAL_COMMUNITY)
Admission: EM | Admit: 2012-01-25 | Discharge: 2012-01-25 | Disposition: A | Discharge status: Home / Self Care | Payer: 59 | Attending: Emergency Medicine | Admitting: Emergency Medicine

## 2012-01-25 ENCOUNTER — Emergency Department (HOSPITAL_COMMUNITY): Payer: 59

## 2012-01-25 ENCOUNTER — Encounter (HOSPITAL_COMMUNITY): Payer: Self-pay | Admitting: Emergency Medicine

## 2012-01-25 DIAGNOSIS — R1110 Vomiting, unspecified: Secondary | ICD-10-CM | POA: Insufficient documentation

## 2012-01-25 DIAGNOSIS — R51 Headache: Secondary | ICD-10-CM | POA: Insufficient documentation

## 2012-01-25 DIAGNOSIS — Z888 Allergy status to other drugs, medicaments and biological substances status: Secondary | ICD-10-CM | POA: Insufficient documentation

## 2012-01-25 DIAGNOSIS — J45909 Unspecified asthma, uncomplicated: Secondary | ICD-10-CM | POA: Insufficient documentation

## 2012-01-25 LAB — COMPREHENSIVE METABOLIC PANEL
ALT: 73 U/L — ABNORMAL HIGH (ref 0–35)
AST: 68 U/L — ABNORMAL HIGH (ref 0–37)
Albumin: 3.7 g/dL (ref 3.5–5.2)
Calcium: 9.9 mg/dL (ref 8.4–10.5)
Creatinine, Ser: 0.82 mg/dL (ref 0.50–1.10)
Sodium: 134 mEq/L — ABNORMAL LOW (ref 135–145)

## 2012-01-25 LAB — URINALYSIS, ROUTINE W REFLEX MICROSCOPIC
Bilirubin Urine: NEGATIVE
Glucose, UA: NEGATIVE mg/dL
Hgb urine dipstick: NEGATIVE
Ketones, ur: NEGATIVE mg/dL
Leukocytes, UA: NEGATIVE
pH: 7.5 (ref 5.0–8.0)

## 2012-01-25 LAB — CBC
HCT: 39.1 % (ref 36.0–46.0)
Hemoglobin: 13.7 g/dL (ref 12.0–15.0)
MCH: 29.7 pg (ref 26.0–34.0)
MCHC: 35 g/dL (ref 30.0–36.0)
MCV: 84.8 fL (ref 78.0–100.0)
RBC: 4.61 MIL/uL (ref 3.87–5.11)

## 2012-01-25 MED ORDER — KETOROLAC TROMETHAMINE 30 MG/ML IJ SOLN
30.0000 mg | Freq: Once | INTRAMUSCULAR | Status: AC
  Administered 2012-01-25: 30 mg via INTRAVENOUS
  Filled 2012-01-25: qty 1

## 2012-01-25 MED ORDER — ONDANSETRON HCL 4 MG PO TABS: 4.0000 mg | ORAL_TABLET | Freq: Four times a day (QID) | ORAL | Status: DC | PRN

## 2012-01-25 MED ORDER — SODIUM CHLORIDE 0.9 % IV BOLUS (SEPSIS)
500.0000 mL | Freq: Once | INTRAVENOUS | Status: AC
  Administered 2012-01-25: 500 mL via INTRAVENOUS

## 2012-01-25 MED ORDER — METOCLOPRAMIDE HCL 5 MG/ML IJ SOLN
10.0000 mg | Freq: Once | INTRAMUSCULAR | Status: AC
  Administered 2012-01-25: 10 mg via INTRAVENOUS
  Filled 2012-01-25: qty 2

## 2012-01-25 MED ORDER — ONDANSETRON HCL 4 MG/2ML IJ SOLN
4.0000 mg | Freq: Once | INTRAMUSCULAR | Status: AC
  Administered 2012-01-25: 4 mg via INTRAVENOUS
  Filled 2012-01-25: qty 2

## 2012-01-25 MED ORDER — MORPHINE SULFATE 4 MG/ML IJ SOLN
4.0000 mg | Freq: Once | INTRAMUSCULAR | Status: AC
  Administered 2012-01-25: 4 mg via INTRAVENOUS
  Filled 2012-01-25: qty 1

## 2012-01-25 NOTE — ED Provider Notes (Signed)
History     CSN: [id number]  Arrival date & time 01/25/12  0911   First MD Initiated Contact with Patient 01/25/12 1006      Chief Complaint  Patient presents with  . Headache  . Emesis    (Consider location/radiation/quality/duration/timing/severity/associated sxs/prior treatment) HPI Comments: 46 year old female with a history of asthma presents emergency department with chief complaint of headache, nausea, and vomiting.  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  The pain has since resolved but the headache has persisted.  Patient ignored the headache and came in to work today at which point she began feeling extremely nauseous which led to multiple emesis episodes (~10x). She denies any abdominal pain, fever, night sweats, chills, change in BM (LNBM this morning), hematemesis, melena, ataxia, dysequilibrium, vision changes, or extremity weakness. Pt has hx of 2 c sections, but no other abdominal surgeries.   Patient is a 46 y.o. female presenting with headaches and vomiting. The history is provided by the patient.  Headache  Associated symptoms include nausea and vomiting. Pertinent negatives include no fever and no shortness of breath.  Emesis  Associated symptoms include headaches. Pertinent negatives include no abdominal pain, no chills and no fever.    Past Medical History  Diagnosis Date  . Asthma   . Epilepsy in 1972  . Epilepsia     Past Surgical History  Procedure Date  . Novasure ablation   . Arthorscopic   . C section     x2    History reviewed. No pertinent family history.  History  Substance Use Topics  . Smoking status: Never Smoker   . Smokeless tobacco: Never Used  . Alcohol Use: 1.5 oz/week    3 drink(s) per week    OB History    Grav Para Term Preterm Abortions TAB SAB Ect Mult Living                  Review of Systems  Constitutional: Negative for fever, chills and appetite change.  HENT:  Negative for congestion.   Eyes: Negative for visual disturbance.  Respiratory: Negative for shortness of breath.   Cardiovascular: Negative for chest pain and leg swelling.  Gastrointestinal: Positive for nausea and vomiting. Negative for abdominal pain, constipation, blood in stool, abdominal distention and anal bleeding.  Genitourinary: Negative for dysuria, urgency and frequency.  Neurological: Positive for headaches. Negative for dizziness, syncope, weakness, light-headedness and numbness.  Psychiatric/Behavioral: Negative for confusion.  All other systems reviewed and are negative.    Allergies  Aspirin free childs  Home Medications   Current Outpatient Rx  Name Route Sig Dispense Refill  . ALBUTEROL SULFATE HFA 108 (90 BASE) MCG/ACT IN AERS Inhalation Inhale 2 puffs into the lungs every 6 (six) hours as needed for wheezing. Please dispense with spacer 1 Inhaler 3  . BUDESONIDE-FORMOTEROL FUMARATE 160-4.5 MCG/ACT IN AERO Inhalation Inhale 2 puffs into the lungs 2 (two) times daily. 1 Inhaler 6  . ESOMEPRAZOLE MAGNESIUM 20 MG PO CPDR Oral Take 1 capsule (20 mg total) by mouth daily before breakfast. 30 capsule 11  . FLUTICASONE PROPIONATE 50 MCG/ACT NA SUSP Nasal Place 2 sprays into the nose daily. 16 g 6  . LEVOTHYROXINE SODIUM 200 MCG PO TABS Oral Take 1 tablet (200 mcg total) by mouth daily. 90 tablet 1  . MELOXICAM 15 MG PO TABS Oral Take 1 tablet (15 mg total) by mouth daily. 30 tablet 3  . TOPIRAMATE 100 MG PO  TABS Oral Take 1 tablet (100 mg total) by mouth daily. 30 tablet 11    BP 130/88  Pulse 98  Temp 99.2 F (37.3 C) (Oral)  Resp 16  SpO2 100%  Physical Exam  Nursing note and vitals reviewed. Constitutional: She is oriented to person, place, and time. She appears well-developed and well-nourished. No distress.  HENT:  Head: Normocephalic and atraumatic.       Mucous membranes moist  Eyes: Conjunctivae normal and EOM are normal.  Neck: Normal range of motion.    Cardiovascular:       Regular rate rhythm, no aberrancy on auscultation  Pulmonary/Chest: Effort normal.       Lungs clear to auscultation bilaterally  Abdominal:       Soft nontender obese abdomen with normal bowel sounds  Musculoskeletal: Normal range of motion.  Neurological: She is alert and oriented to person, place, and time.       Cranial nerves III through XII intact, strength 5/5 in extremities bilaterally, good coordination, no difficulty with ambulation.  Skin: Skin is warm and dry. No rash noted. She is not diaphoretic.  Psychiatric: She has a normal mood and affect. Her behavior is normal.    ED Course  Procedures (including critical care time)  Labs Reviewed  COMPREHENSIVE METABOLIC PANEL - Abnormal; Notable for the following:    Sodium 134 (*)     Total Protein 9.2 (*)     AST 68 (*)     ALT 73 (*)     Alkaline Phosphatase 159 (*)     GFR calc non Af Amer 85 (*)     All other components within normal limits  CBC  URINALYSIS, ROUTINE W REFLEX MICROSCOPIC  LIPASE, BLOOD   No results found.   No diagnosis found.  12:54 PM Labs reviewed, abd [place] ordered to look at liver and biliary as possible etiology.   1:35 PM No acute abnormalities on [place], dipo pending PO challenge  2:30 PM Pt reporting persistent HA, like typical. Will give additional fluid and reglan. PO challenge passed.  MDM  HA, N, V  Vitals are stable, no fever.  No signs of dehydration, tolerating PO fluids > 6 oz.  Lungs are clear.  No focal abdominal pain, no concern for appendicitis, cholecystitis, pancreatitis, ruptured viscus, UTI, kidney stone, or any other abdominal etiology.  Supportive therapy indicated with return if symptoms worsen.  Patient counseled. In addition pt reports HA which was treated and improved while in ED.  Presentation is like pts typical HA and non concerning for [facility], ICH, Meningitis, or temporal arteritis. Pt is afebrile with no focal neuro deficits, nuchal rigidity, or  change in vision. Pt is to follow up with PCP to discuss prophylactic medication. Pt verbalizes understanding and is agreeable with plan to dc.           Verl Dicker, [place] 01/25/12 1448

## 2012-01-25 NOTE — ED Notes (Addendum)
Pt c/o generalized HA and dental pain starting yesterday that was worse today with N/V; pt c/o generalized weakness also

## 2012-01-25 NOTE — ED Notes (Signed)
Patient unable to void at this time.

## 2012-01-26 NOTE — ED Provider Notes (Signed)
Medical screening examination/treatment/procedure(s) were performed by non-physician practitioner and as supervising physician I was immediately available for consultation/collaboration.   Charles B. Karle Starch, MD 01/26/12 [phone]

## 2012-01-27 ENCOUNTER — Encounter: Payer: Self-pay | Admitting: Family Medicine

## 2012-01-27 ENCOUNTER — Ambulatory Visit (INDEPENDENT_AMBULATORY_CARE_PROVIDER_SITE_OTHER): Payer: 59 | Admitting: Family Medicine

## 2012-01-27 VITALS — BP 114/76 | HR 71 | Temp 98.4°F | Ht 66.0 in | Wt 243.0 lb

## 2012-01-27 DIAGNOSIS — G43909 Migraine, unspecified, not intractable, without status migrainosus: Secondary | ICD-10-CM

## 2012-01-27 MED ORDER — AMITRIPTYLINE HCL 10 MG PO TABS: ORAL_TABLET | ORAL | Status: DC

## 2012-01-27 MED ORDER — ONDANSETRON HCL 4 MG PO TABS: 4.0000 mg | ORAL_TABLET | Freq: Four times a day (QID) | ORAL | Status: DC | PRN

## 2012-01-27 MED ORDER — SUMATRIPTAN SUCCINATE 50 MG PO TABS: 50.0000 mg | ORAL_TABLET | ORAL | Status: DC | PRN

## 2012-01-27 NOTE — Patient Instructions (Signed)
Please stop taking your old medication for headaches (Topamax).    Your new medication for prevention of headaches is called Amitriptyline.  You should take this medication at bedtime.  Please start with one tablet at bedtime daily, then after one week increase to two tablets at bedtime.   You new medication to take when a headache starts is called Imitrex.  You can take one tablet when a migraine starts.  In two hours, if you still have the headache, you can take a second one, but then no more.   I have also refilled your nausea medication.

## 2012-01-27 NOTE — Progress Notes (Signed)
  Subjective:    Patient ID: Holly Mueller, female    DOB: 1965/08/30, 46 y.o.   MRN: [id number]  HPI  Kameron comes in for follow up after an ER visit for migraine headache and nausea/vomiting two days ago.  She received IV fluids, nausea medications, and headache medications while in the ER, which helped.  However, she says says she is still having a little bit of a headache and some nausea today.  She denies any weakness, focal neuro deficits, or more vomiting.  She does not have much of an appetite.  She is taking a medication for her headaches that she says does not do anything- she has the bottle with her and the label has rubbed off so she is not sure what medication this is.  She does not have a rescue medication for her migraines.  She is almost out of the nausea medication. She has not had fever/chills, confusion.   Review of Systems See HPI    Objective:   Physical Exam BP 114/76  Pulse 71  Temp 98.4 F (36.9 C) (Oral)  Ht _0  (1.676 m)  Wt 243 lb (110.224 kg)  BMI 39.22 kg/m2 General appearance: alert, cooperative and no distress Neurologic: Cranial nerves: normal       Assessment & Plan:

## 2012-01-27 NOTE — Assessment & Plan Note (Addendum)
Will d/c what I think is Topamax for migrane ppx, and start amitriptyline, slow taper.  Will also add imitrex for rescue.  Discussed trying to ID migraine triggers.  Also refilled zofran. She has a f/u scheduled to see Dr. Adrian Blackwater in about two weeks.

## 2012-01-29 ENCOUNTER — Encounter: Payer: Self-pay | Admitting: Family Medicine

## 2012-01-29 ENCOUNTER — Ambulatory Visit (INDEPENDENT_AMBULATORY_CARE_PROVIDER_SITE_OTHER): Payer: 59 | Admitting: Family Medicine

## 2012-01-29 VITALS — BP 121/82 | HR 89 | Temp 98.2°F | Ht 66.0 in | Wt 243.0 lb

## 2012-01-29 DIAGNOSIS — I998 Other disorder of circulatory system: Secondary | ICD-10-CM

## 2012-01-29 DIAGNOSIS — R58 Hemorrhage, not elsewhere classified: Secondary | ICD-10-CM | POA: Insufficient documentation

## 2012-01-29 NOTE — Patient Instructions (Signed)
Your leg looks like you have bruised it somehow.  Please use the ace wrap to help keep swelling out, and take ibuprofen or tylenol as needed.  If it becomes more red, swollen, warm, or painful, please call the office and let us know or seek medical care.

## 2012-01-29 NOTE — Progress Notes (Signed)
  Subjective:    Patient ID: Holly Mueller, female    DOB: 04/20/1966, 46 y.o.   MRN: [id number]  HPI  Jisselle comes in with a rash on her left shin that started Wednesday night.  She says it is a little painful, and her leg seems a little swollen which is unusual for her. She denies any trauma, any fevers chills, calf swelling or pain.  She does not take any anticoagulation.  She has not had any changes in soaps or detergents, nor been outside in the woods.   She took elavil for her migraines for the first time last night- but this started 24 hours prior to that.   Review of Systems See HPI    Objective:   Physical Exam BP 121/82  Pulse 89  Temp 98.2 F (36.8 C) (Oral)  Ht _0  (1.676 m)  Wt 243 lb (110.224 kg)  BMI 39.22 kg/m2 General appearance: alert, cooperative and no distress Left Leg: Trace edema compared to right, calf is non-tender to squeeze test She has a 10 x10 area of blotchy red dots, consistent with bruising on the front of her shin.        Assessment & Plan:

## 2012-01-29 NOTE — Assessment & Plan Note (Signed)
Patient does not remember a trauma, but this appears to e an ecchymosis.  Will have her use ace wrap for the minor swelling and ibuprofen or tylenol prn for the pain.  F/u if it does not improve.

## 2012-02-12 ENCOUNTER — Other Ambulatory Visit: Payer: Self-pay | Admitting: Family Medicine

## 2012-02-12 ENCOUNTER — Encounter: Payer: Self-pay | Admitting: Family Medicine

## 2012-02-12 ENCOUNTER — Ambulatory Visit (INDEPENDENT_AMBULATORY_CARE_PROVIDER_SITE_OTHER): Payer: 59 | Admitting: Family Medicine

## 2012-02-12 VITALS — BP 128/87 | HR 68 | Temp 98.3°F | Ht 66.0 in | Wt 247.0 lb

## 2012-02-12 DIAGNOSIS — E039 Hypothyroidism, unspecified: Secondary | ICD-10-CM

## 2012-02-12 DIAGNOSIS — M1990 Unspecified osteoarthritis, unspecified site: Secondary | ICD-10-CM | POA: Insufficient documentation

## 2012-02-12 DIAGNOSIS — Z1231 Encounter for screening mammogram for malignant neoplasm of breast: Secondary | ICD-10-CM

## 2012-02-12 DIAGNOSIS — J45909 Unspecified asthma, uncomplicated: Secondary | ICD-10-CM

## 2012-02-12 DIAGNOSIS — Z0000 Encounter for general adult medical examination without abnormal findings: Secondary | ICD-10-CM

## 2012-02-12 MED ORDER — DICLOFENAC SODIUM 75 MG PO TBEC: 75.0000 mg | DELAYED_RELEASE_TABLET | Freq: Two times a day (BID) | ORAL | Status: DC

## 2012-02-12 NOTE — Patient Instructions (Addendum)
Holly Mueller,  Thank you for coming in today.  Your next pap smear is due August 2014.  Please schedule your yearly mammogram at your convenience.  Please schedule a dental visit at your convenience.  For arthritis: 1. Continued weight loss. Down 10 lbs! Great work.  2. Exercise 3-4 days per week, walking.  3. Diclofenac twice daily as needed for pain. No other NSAIDs: motrin, ibuprofen, aleve, naproxen etc.  4. Elevated liver function test: avoid tylenol.   For hypothyroidism: checking TSH will adjust dose as needed.   Next physical in one year. Make RN visit for flu shot. F/u as needed for acute issues.   Dr. Adrian Blackwater

## 2012-02-14 DIAGNOSIS — Z7189 Other specified counseling: Secondary | ICD-10-CM | POA: Insufficient documentation

## 2012-02-14 NOTE — Assessment & Plan Note (Signed)
A: compliant with medication. TSH persistently elevated. P: check TSH and adjust synthroid as needed. Consider replacing PPI with H2 blocker to improve absorption of hormone.

## 2012-02-14 NOTE — Assessment & Plan Note (Signed)
Self referral form for mammogram. Discussed need for dental visit and flu shot.

## 2012-02-14 NOTE — Assessment & Plan Note (Signed)
A: normal lung exam. Using albuterol often but unclear if using it for bronchospasm vs URI symptoms.  P: Continue albuterol for now Now controller medication at this times. Defer flu shot until feeling better from URI.

## 2012-02-14 NOTE — Progress Notes (Signed)
Subjective:     Patient ID: Holly Mueller, female   DOB: 07/19/1965, 46 y.o.   MRN: [id number]  HPI 46 yo F presents for yearly physical.   She reports URI that is improving with OTC decongestant. She denies fever.   Hypothyroidism: taking synthroid. Denies fever, weight gain. Admits to decreased appetite. Not exercising.   Asthma: never smoked Using albuterol 3-4 x / week. Increased use this week with URI. No hospitalizations or intubations related to asthma exacerbation.   Osteoarthritis: tried mobic w/ relief. Pain in shoulders, hip and knees. Still working. No joint swelling or effusions.   Health maintenance: due for mammogram, flu shot.   Past Medical History  Diagnosis Date  . Asthma 2004  . GERD (gastroesophageal reflux disease) 2008  . Epilepsy in 1972    No seizures since 1972. Previously treated with phenobarbital.   . Hypothyroidism 2008  . Seasonal allergies 2003   Past Surgical History  Procedure Date  . Novasure ablation   . C section     x2  . R shoulder scope 2011   History   Social History  . Marital Status: Single    Spouse Name: N/A    Number of Children: 2  . Years of Education: N/A   Occupational History  .  [employer]   Social History Main Topics  . Smoking status: Never Smoker   . Smokeless tobacco: Never Used  . Alcohol Use: 1.5 oz/week    3 drink(s) per week     special occasions.   . Drug Use: No  . Sexually Active: No   Other Topics Concern  . Not on file   Social History Narrative   Works in Estate manager/land agent at [employer]. Lives with 12 and 14 (boys). Also living with close friends of the family (mother and two kids, adults).    Review of Systems As per HPI    Objective:   Physical Exam BP 128/87  Pulse 68  Temp 98.3 F (36.8 C) (Oral)  Ht _0  (1.676 m)  Wt 247 lb (112.038 kg)  BMI 39.87 kg/m2 General appearance: alert, cooperative and no distress Head: Normocephalic, without obvious abnormality,  atraumatic Eyes: conjunctivae/corneas clear. PERRL, EOM's intact. Ears: normal TM's and external ear canals both ears Nose: Nares normal. Septum midline. Mucosa normal. No drainage or sinus tenderness. Throat: lips, mucosa, and tongue normal; poor dentition and gingivitis.  Neck: no adenopathy, no carotid bruit, no JVD, supple, symmetrical, trachea midline and thyroid not enlarged, symmetric, no tenderness/mass/nodules Lungs: clear to auscultation bilaterally Heart: regular rate and rhythm, S1, S2 normal, no murmur, click, rub or gallop Abdomen: soft, non-tender; bowel sounds normal; no masses,  no organomegaly Extremities: extremities normal, atraumatic, no cyanosis or edema Pulses: 2+ and symmetric Neurologic: Grossly normal     Assessment and Plan:

## 2012-02-14 NOTE — Assessment & Plan Note (Signed)
A: chronic. Tolerable pain. P: Diclofenac, no other NSAIDs Avoid tylenol given elevated LFTs Weight loss  3-4x weekly exercise

## 2012-02-16 ENCOUNTER — Telehealth: Payer: Self-pay | Admitting: Family Medicine

## 2012-02-16 DIAGNOSIS — E039 Hypothyroidism, unspecified: Secondary | ICD-10-CM

## 2012-02-16 MED ORDER — LEVOTHYROXINE SODIUM 300 MCG PO TABS: 300.0000 ug | ORAL_TABLET | Freq: Every day | ORAL | Status: DC

## 2012-02-16 NOTE — Telephone Encounter (Signed)
Called patient. Left VM 1. TSH elevated. 2. Synthroid increase to 300 mcg 3. Ask patient to f/u in 8 weeks for recheck 4. Advised patient that other option for treatment is possibly d/c nexium to help with absorption.

## 2012-03-15 ENCOUNTER — Ambulatory Visit: Payer: Self-pay

## 2012-03-29 ENCOUNTER — Encounter (HOSPITAL_COMMUNITY): Payer: Self-pay | Admitting: Emergency Medicine

## 2012-03-29 ENCOUNTER — Emergency Department (HOSPITAL_COMMUNITY)
Admission: EM | Admit: 2012-03-29 | Discharge: 2012-03-29 | Disposition: A | Discharge status: Home / Self Care | Payer: 59 | Attending: Emergency Medicine | Admitting: Emergency Medicine

## 2012-03-29 DIAGNOSIS — E039 Hypothyroidism, unspecified: Secondary | ICD-10-CM | POA: Insufficient documentation

## 2012-03-29 DIAGNOSIS — G40909 Epilepsy, unspecified, not intractable, without status epilepticus: Secondary | ICD-10-CM | POA: Insufficient documentation

## 2012-03-29 DIAGNOSIS — K219 Gastro-esophageal reflux disease without esophagitis: Secondary | ICD-10-CM | POA: Insufficient documentation

## 2012-03-29 DIAGNOSIS — J45909 Unspecified asthma, uncomplicated: Secondary | ICD-10-CM | POA: Insufficient documentation

## 2012-03-29 DIAGNOSIS — K429 Umbilical hernia without obstruction or gangrene: Secondary | ICD-10-CM | POA: Insufficient documentation

## 2012-03-29 DIAGNOSIS — Z79899 Other long term (current) drug therapy: Secondary | ICD-10-CM | POA: Insufficient documentation

## 2012-03-29 NOTE — ED Provider Notes (Signed)
History     CSN: [id number]  Arrival date & time 03/29/12  1554   First MD Initiated Contact with Patient 03/29/12 1751      Chief Complaint  Patient presents with  . Belly Button Was Bleeding     (Consider location/radiation/quality/duration/timing/severity/associated sxs/prior treatment) HPI Patient presents emergency department with blood noted from her umbilical area.  Patient, states today she noticed blood on her underwear from her umbilical region.  Patient, states had some mild irritation in the area in the past.  Patient, states that is her some swelling to the area.  Patient, states that she does not have any nausea, vomiting, fever, abdominal pain, chest.  Patient breath, back pain, dysuria, or dizziness.  Patient, states that she noticed some small amount of blood yesterday from the same area of her umbilical area. Past Medical History  Diagnosis Date  . Asthma 2004  . GERD (gastroesophageal reflux disease) 2008  . Epilepsy in 1972    No seizures since 1972. Previously treated with phenobarbital.   . Hypothyroidism 2008  . Seasonal allergies 2003    Past Surgical History  Procedure Date  . Novasure ablation   . C section     x2  . R shoulder scope 2011    Family History  Problem Relation Age of Onset  . Hypertension Mother   . Diabetes Mother   . Lung cancer Father   . Stroke Father   . Diabetes Sister   . Hypertension Sister     History  Substance Use Topics  . Smoking status: Never Smoker   . Smokeless tobacco: Never Used  . Alcohol Use: 1.5 oz/week    3 drink(s) per week     Comment: special occasions.     OB History    Grav Para Term Preterm Abortions TAB SAB Ect Mult Living                  Review of Systems All other systems negative except as documented in the HPI. All pertinent positives and negatives as reviewed in the HPI.  Allergies  Aspirin  Home Medications   Current Outpatient Rx  Name  Route  Sig  Dispense  Refill  .  ALBUTEROL SULFATE HFA 108 (90 BASE) MCG/ACT IN AERS   Inhalation   Inhale 2 puffs into the lungs every 6 (six) hours as needed.         Marland Kitchen AMITRIPTYLINE HCL 10 MG PO TABS   Oral   Take 20 mg by mouth at bedtime.         . BUDESONIDE-FORMOTEROL FUMARATE 160-4.5 MCG/ACT IN AERO   Inhalation   Inhale 2 puffs into the lungs 2 (two) times daily.   1 Inhaler   6   . DICLOFENAC SODIUM 75 MG PO TBEC   Oral   Take 1 tablet (75 mg total) by mouth 2 (two) times daily.   60 tablet   3   . ESOMEPRAZOLE MAGNESIUM 20 MG PO CPDR   Oral   Take 1 capsule (20 mg total) by mouth daily before breakfast.   30 capsule   11   . FLUTICASONE PROPIONATE 50 MCG/ACT NA SUSP   Nasal   Place 2 sprays into the nose daily.   16 g   6   . LEVOTHYROXINE SODIUM 300 MCG PO TABS   Oral   Take 1 tablet (300 mcg total) by mouth daily.   90 tablet   1   . SUMATRIPTAN SUCCINATE  50 MG PO TABS   Oral   Take 1 tablet (50 mg total) by mouth every 2 (two) hours as needed for migraine.   10 tablet   0     BP 141/75  Pulse 61  Temp 98.4 F (36.9 C) (Oral)  Resp 12  SpO2 95%  Physical Exam  Nursing note and vitals reviewed. Constitutional: She appears well-developed and well-nourished. No distress.  HENT:  Head: Normocephalic and atraumatic.  Cardiovascular: Normal rate, regular rhythm and normal heart sounds.   Pulmonary/Chest: Effort normal and breath sounds normal.  Abdominal: Soft. Normal appearance and bowel sounds are normal. She exhibits no distension. There is no tenderness. There is no rigidity, no rebound and no guarding.      ED Course  Procedures (including critical care time) Patient is stable and referred to surgery for further evaluation.  Told to return here for any worsening in her condition.  Advised to keep the area clean and dry   MDM          Brent General, PA-C 03/30/12 [phone]

## 2012-03-29 NOTE — ED Notes (Signed)
Per EMS: Pt states she called 911 because her "belly button is bleeding".  EMS stated that bleeding was minimal and that the bleeding has stopped.

## 2012-03-29 NOTE — ED Notes (Signed)
Pt states that her belly button was bleeding earlier.  Denies messing with it.  Bleeding has stopped.

## 2012-03-30 ENCOUNTER — Emergency Department (HOSPITAL_COMMUNITY)
Admission: EM | Admit: 2012-03-30 | Discharge: 2012-03-30 | Disposition: A | Discharge status: Home / Self Care | Payer: 59 | Source: Home / Self Care | Attending: Emergency Medicine | Admitting: Emergency Medicine

## 2012-03-30 ENCOUNTER — Encounter (HOSPITAL_COMMUNITY): Payer: Self-pay | Admitting: *Deleted

## 2012-03-30 DIAGNOSIS — K056 Periodontal disease, unspecified: Secondary | ICD-10-CM

## 2012-03-30 DIAGNOSIS — K0889 Other specified disorders of teeth and supporting structures: Secondary | ICD-10-CM

## 2012-03-30 DIAGNOSIS — K089 Disorder of teeth and supporting structures, unspecified: Secondary | ICD-10-CM

## 2012-03-30 MED ORDER — HYDROCODONE-ACETAMINOPHEN 5-500 MG PO TABS: 1.0000 | ORAL_TABLET | Freq: Four times a day (QID) | ORAL | Status: DC | PRN

## 2012-03-30 MED ORDER — PENICILLIN V POTASSIUM 500 MG PO TABS: 500.0000 mg | ORAL_TABLET | Freq: Three times a day (TID) | ORAL | Status: AC

## 2012-03-30 NOTE — ED Notes (Signed)
Pt needs all teeth removed/currently having dental pain and facial swelling

## 2012-03-30 NOTE — ED Provider Notes (Addendum)
History     CSN: [id number]  Arrival date & time 03/30/12  1358   First MD Initiated Contact with Patient 03/30/12 1504      Chief Complaint  Patient presents with  . Dental Problem    (Consider location/radiation/quality/duration/timing/severity/associated sxs/prior treatment) HPI Comments: Patient presents urgent care complaining of right lower jaw pain and swelling since yesterday. Denies any fevers difficulty swallowing or headaches. She has been having dental problems for many months.  The history is provided by the patient.    Past Medical History  Diagnosis Date  . Asthma 2004  . GERD (gastroesophageal reflux disease) 2008  . Epilepsy in 1972    No seizures since 1972. Previously treated with phenobarbital.   . Hypothyroidism 2008  . Seasonal allergies 2003    Past Surgical History  Procedure Date  . Novasure ablation   . C section     x2  . R shoulder scope 2011    Family History  Problem Relation Age of Onset  . Hypertension Mother   . Diabetes Mother   . Lung cancer Father   . Stroke Father   . Diabetes Sister   . Hypertension Sister     History  Substance Use Topics  . Smoking status: Never Smoker   . Smokeless tobacco: Never Used  . Alcohol Use: 1.5 oz/week    3 drink(s) per week     Comment: special occasions.     OB History    Grav Para Term Preterm Abortions TAB SAB Ect Mult Living                  Review of Systems  Constitutional: Negative for activity change and appetite change.  HENT: Positive for facial swelling. Negative for sneezing, trouble swallowing, voice change and postnasal drip.   Neurological: Negative for dizziness and headaches.    Allergies  Aspirin  Home Medications   Current Outpatient Rx  Name  Route  Sig  Dispense  Refill  . ALBUTEROL SULFATE HFA 108 (90 BASE) MCG/ACT IN AERS   Inhalation   Inhale 2 puffs into the lungs every 6 (six) hours as needed.         Marland Kitchen AMITRIPTYLINE HCL 10 MG PO TABS  Oral   Take 20 mg by mouth at bedtime.         . BUDESONIDE-FORMOTEROL FUMARATE 160-4.5 MCG/ACT IN AERO   Inhalation   Inhale 2 puffs into the lungs 2 (two) times daily.   1 Inhaler   6   . ESOMEPRAZOLE MAGNESIUM 20 MG PO CPDR   Oral   Take 1 capsule (20 mg total) by mouth daily before breakfast.   30 capsule   11   . FLUTICASONE PROPIONATE 50 MCG/ACT NA SUSP   Nasal   Place 2 sprays into the nose daily.   16 g   6   . HYDROCODONE-ACETAMINOPHEN 5-500 MG PO TABS   Oral   Take 1-2 tablets by mouth every 6 (six) hours as needed for pain.   10 tablet   0   . LEVOTHYROXINE SODIUM 300 MCG PO TABS   Oral   Take 1 tablet (300 mcg total) by mouth daily.   90 tablet   1   . PENICILLIN V POTASSIUM 500 MG PO TABS   Oral   Take 1 tablet (500 mg total) by mouth 3 (three) times daily.   40 tablet   0   . SUMATRIPTAN SUCCINATE 50 MG PO TABS  Oral   Take 1 tablet (50 mg total) by mouth every 2 (two) hours as needed for migraine.   10 tablet   0     BP 176/85  Pulse 95  Temp 98 F (36.7 C) (Oral)  Resp 18  SpO2 100%  Physical Exam  Nursing note and vitals reviewed. Constitutional: She appears well-developed and well-nourished.  HENT:  Head: No trismus in the jaw.  Mouth/Throat: No oral lesions. Abnormal dentition. Dental caries present. No uvula swelling. No oropharyngeal exudate.    Eyes: Conjunctivae normal are normal.  Neck: Neck supple. No JVD present.  Lymphadenopathy:    She has no cervical adenopathy.  Neurological: She is alert.  Skin: No erythema.    ED Course  Procedures (including critical care time)  Labs Reviewed - No data to display No results found.   1. Pain, dental   2. Periodontal disease       MDM  Moderate to severe periodontal disease and multiple open dental cavities. Suspicious for early abscess formation on right lower molar. Start patient on antibiotics and  provided him 6 tablets of lortab encourage her to call the dentist  on call       Rosana Hoes, MD 03/30/12 [place], MD 03/30/12 1659

## 2012-03-30 NOTE — ED Provider Notes (Signed)
Medical screening examination/treatment/procedure(s) were conducted as a shared visit with non-physician practitioner(s) and myself.  I personally evaluated the patient during the encounter    Julianne Rice, MD 03/30/12 2216

## 2012-04-01 ENCOUNTER — Encounter (INDEPENDENT_AMBULATORY_CARE_PROVIDER_SITE_OTHER): Payer: Self-pay | Admitting: Surgery

## 2012-04-01 ENCOUNTER — Ambulatory Visit (INDEPENDENT_AMBULATORY_CARE_PROVIDER_SITE_OTHER): Payer: Commercial Managed Care - PPO | Admitting: Surgery

## 2012-04-01 VITALS — BP 144/78 | HR 68 | Temp 97.3°F | Resp 16 | Ht 66.0 in | Wt 246.2 lb

## 2012-04-01 DIAGNOSIS — K429 Umbilical hernia without obstruction or gangrene: Secondary | ICD-10-CM

## 2012-04-01 NOTE — Progress Notes (Addendum)
Re:   Holly Mueller DOB:   December 27, 1965 MRN:   [id number]  ASSESSMENT AND PLAN: 1.  Umbilical hernia  I discussed the indications and complications of hernia surgery with the patient.  I discussed both the laparoscopic and open approach to hernia repair..  The potential risks of hernia surgery include, but are not limited to, bleeding, infection, open surgery, nerve injury, and recurrence of the hernia.  I provided the patient literature about hernia surgery.  Ulcer at the bottom of her umbilicus.  I think she is best served with a CT scan to see how big her umbilical hernia is.  Secondly, I discussed removing her umbilicus as a way to "fix" the ulcer.  Her habitus (weight) makes keeping her umbilicus very hard to clean and see.  I will see her back in 3 to 4 weeks to review the CT scan.  [Epic message: i just wanting to let Dr.Ichiro Chesnut know that i have blood in my stools today i don't know if it is because of the hernia or because i strain to do a #2 just wanting to let someone know.  DN 04/14/2012]  2.  History of seizures  Last seizure was 107.  She is on no meds 3.  History of migraine headaches x 4 years.  Takes Imitrex 4.  History of depression 5.  Asthma  Does not smoke 6.  Very poor dentition  Possible abscess right lower jaw 7.  Morbid obesity - weight - 246, BMI - 39.7 (counseled on weight issues) 8.  GERD x 4 years. 9.  Hypothyroid - on replacement x 2 years.  Chief Complaint  Patient presents with  . New Evaluation    EVAL UMB HERNIA   REFERRING PHYSICIAN: Boykin Nearing, MD  HISTORY OF PRESENT ILLNESS: Holly Mueller is a 46 y.o. (DOB: 12/15/1965)  white female whose primary care physician is Boykin Nearing, MD and comes to me today for bleeding from umbilicus and possible umbilical hernia.  She has had some peri-umbilical pain for about 3 weeks.  About 1 week about ago, she noticed some drainage from her umbilicus.  Then this past Tuesday, 03/29/2012, she went to  the [facility] for bleeding from her umbilicus.    Past Medical History  Diagnosis Date  . Asthma 2004  . GERD (gastroesophageal reflux disease) 2008  . Epilepsy in 1972    No seizures since 1972. Previously treated with phenobarbital.   . Hypothyroidism 2008  . Seasonal allergies 2003  . Arthritis       Past Surgical History  Procedure Date  . Novasure ablation   . C section     x2  . R shoulder scope 2011  . Cesarean section     x2      Current Outpatient Prescriptions  Medication Sig Dispense Refill  . albuterol (PROVENTIL HFA;VENTOLIN HFA) 108 (90 BASE) MCG/ACT inhaler Inhale 2 puffs into the lungs every 6 (six) hours as needed.      Marland Kitchen amitriptyline (ELAVIL) 10 MG tablet Take 20 mg by mouth at bedtime.      . budesonide-formoterol (SYMBICORT) 160-4.5 MCG/ACT inhaler Inhale 2 puffs into the lungs 2 (two) times daily.  1 Inhaler  6  . diclofenac (VOLTAREN) 25 MG EC tablet Take 25 mg by mouth 2 (two) times daily. Pt unsure of actual dosage      . esomeprazole (NEXIUM) 20 MG capsule Take 1 capsule (20 mg total) by mouth daily before breakfast.  30 capsule  11  .  fluticasone (FLONASE) 50 MCG/ACT nasal spray Place 2 sprays into the nose daily.  16 g  6  . HYDROcodone-acetaminophen (VICODIN) 5-500 MG per tablet Take 1-2 tablets by mouth every 6 (six) hours as needed for pain.  10 tablet  0  . levothyroxine (SYNTHROID, LEVOTHROID) 300 MCG tablet Take 1 tablet (300 mcg total) by mouth daily.  90 tablet  1  . penicillin v potassium (VEETID) 500 MG tablet Take 1 tablet (500 mg total) by mouth 3 (three) times daily.  40 tablet  0  . SUMAtriptan (IMITREX) 50 MG tablet Take 1 tablet (50 mg total) by mouth every 2 (two) hours as needed for migraine.  10 tablet  0      Allergies  Allergen Reactions  . Aspirin Nausea Only    REVIEW OF SYSTEMS: Skin:  No history of rash.  No history of abnormal moles. Infection:  Very poor dental care.  She has inflammation (possible abscess) along right  jaw. Neurologic:  History of seizures, but her last one was in 1972.  History of migraine headaches. Cardiac:  No history of hypertension. No history of heart disease.  No history of prior cardiac catheterization.  No history of seeing a cardiologist. Pulmonary:  Does not smoke cigarettes.  No asthma or bronchitis.  No OSA/CPAP.  Endocrine:  No diabetes. Hypothyroid, corrected. Gastrointestinal:  No history of stomach disease.  No history of liver disease.  No history of gall bladder disease.  No history of pancreas disease.  No history of colon disease. Urologic:  No history of kidney stones.  No history of bladder infections. Musculoskeletal:  History of right shoulder surgery. Hematologic:  No bleeding disorder.  No history of anemia.  Not anticoagulated. Psycho-social:  The patient is oriented.   History of depression.  SOCIAL and FAMILY HISTORY: Unmarried. Has 2 children - 12 and 14. Works in [employer] at [place]: BP 144/78  Pulse 68  Temp 97.3 F (36.3 C) (Temporal)  Resp 16  Ht _0  (1.676 m)  Wt 246 lb 3.2 oz (111.676 kg)  BMI 39.74 kg/m2  General: Obese WF who is alert and generally healthy appearing.  HEENT: Normal. Pupils equal. Neck: Supple. No mass.  No thyroid mass. Lymph Nodes:  No supraclavicular or cervical nodes. Lungs: Clear to auscultation and symmetric breath sounds. Heart:  RRR. No murmur or rub. Abdomen: Soft. No mass. Normal bowel sounds.  Pfannenstiel incision.  Has deep umbilicus with 0.5 cm ulcer at the bottom of the umbilicus.  Because of her weight and pannus, is is hard to tell whether she has much of a hernia.  She is more apple than pear and has a significant pannus. Rectal: Not done. Extremities:  Good strength and ROM  in upper and lower extremities. Neurologic:  Grossly intact to motor and sensory function. Psychiatric: Has normal mood and affect. Behavior is normal.   DATA REVIEWED: Epic notes.  Alphonsa Overall, MD,   [facility], [address] [place]  [place], [place]    [place] Phone:  [phone] FAX:  [phone]

## 2012-04-04 ENCOUNTER — Telehealth (INDEPENDENT_AMBULATORY_CARE_PROVIDER_SITE_OTHER): Payer: Self-pay

## 2012-04-04 NOTE — Telephone Encounter (Signed)
Msg to call to remind her of appt scheduled for 122013_0 ;15a

## 2012-04-05 ENCOUNTER — Other Ambulatory Visit: Payer: Self-pay

## 2012-04-05 ENCOUNTER — Telehealth (INDEPENDENT_AMBULATORY_CARE_PROVIDER_SITE_OTHER): Payer: Self-pay

## 2012-04-05 NOTE — Telephone Encounter (Signed)
Appt with Dr. Lucia Gaskins on 12/20/13211:15 Msg for her to call to confirm

## 2012-04-05 NOTE — Telephone Encounter (Signed)
The pt called and reported that she can't afford the ct set up at [facility].  She needs it to be set up at [facility].  She is off Tuesday, otherwise she needs to know what days they have available because she will have to take time off.  You can call her at [phone] and leave a message.

## 2012-04-05 NOTE — Telephone Encounter (Signed)
Called patient to remind her of her Ct  Abd/pelvis today at 3p at [address]  And P/O appt to discuss results w/Dr. Lucia Gaskins 04/29/12_0 :15

## 2012-04-06 ENCOUNTER — Telehealth (INDEPENDENT_AMBULATORY_CARE_PROVIDER_SITE_OTHER): Payer: Self-pay

## 2012-04-06 NOTE — Telephone Encounter (Signed)
Pt aware of appt @ [facility]. 12/3/1322p she will be there at 1:45p she is aware she is to pick up her contrast between today and Monday at [facility].

## 2012-04-06 NOTE — Telephone Encounter (Signed)
Left msg for patient to call about her new appt with [facility] for her CT abd / pelvis with and without oral contrast on Tuesday December 03,2013 @ 2pm . She is to be there at 1:45 for prep/paper work.

## 2012-04-11 ENCOUNTER — Telehealth (INDEPENDENT_AMBULATORY_CARE_PROVIDER_SITE_OTHER): Payer: Self-pay

## 2012-04-11 NOTE — Telephone Encounter (Signed)
Informed patient  DR.Lucia Gaskins states there is not any restrictions at this time.

## 2012-04-11 NOTE — Telephone Encounter (Signed)
Pt has an ulcer in the deep umbilical area and is scheduled for a CT scan tomorrow, and then f/u with Dr. Lucia Gaskins.  She works in an [employer], and would like to know if she could have a letter of restrictions for work.  Please advise.

## 2012-04-12 ENCOUNTER — Ambulatory Visit (HOSPITAL_COMMUNITY)
Admission: RE | Admit: 2012-04-12 | Discharge: 2012-04-12 | Disposition: A | Discharge status: Home / Self Care | Payer: 59 | Source: Ambulatory Visit | Attending: Surgery | Admitting: Surgery

## 2012-04-12 DIAGNOSIS — K429 Umbilical hernia without obstruction or gangrene: Secondary | ICD-10-CM | POA: Insufficient documentation

## 2012-04-12 DIAGNOSIS — R161 Splenomegaly, not elsewhere classified: Secondary | ICD-10-CM | POA: Insufficient documentation

## 2012-04-12 DIAGNOSIS — R599 Enlarged lymph nodes, unspecified: Secondary | ICD-10-CM | POA: Insufficient documentation

## 2012-04-12 MED ORDER — IOHEXOL 300 MG/ML  SOLN
100.0000 mL | Freq: Once | INTRAMUSCULAR | Status: AC | PRN
  Administered 2012-04-12: 100 mL via INTRAVENOUS

## 2012-04-13 ENCOUNTER — Encounter (INDEPENDENT_AMBULATORY_CARE_PROVIDER_SITE_OTHER): Payer: Self-pay | Admitting: Surgery

## 2012-04-15 ENCOUNTER — Telehealth (INDEPENDENT_AMBULATORY_CARE_PROVIDER_SITE_OTHER): Payer: Self-pay

## 2012-04-15 NOTE — Telephone Encounter (Signed)
Pt was informed Ct results will be discussed at [facility] 04/29/12.

## 2012-04-29 ENCOUNTER — Ambulatory Visit (INDEPENDENT_AMBULATORY_CARE_PROVIDER_SITE_OTHER): Payer: Commercial Managed Care - PPO | Admitting: Surgery

## 2012-04-29 VITALS — BP 110/78 | HR 72 | Temp 97.5°F | Resp 18 | Ht 66.0 in | Wt 242.0 lb

## 2012-04-29 DIAGNOSIS — K429 Umbilical hernia without obstruction or gangrene: Secondary | ICD-10-CM

## 2012-04-29 NOTE — Patient Instructions (Signed)
To call back to schedule surgery.  Talk to [facility] regarding the surgery.

## 2012-04-29 NOTE — Progress Notes (Addendum)
Re:   Holly Mueller DOB:   Jan 26, 1966 MRN:   [id number]  ASSESSMENT AND PLAN: 1.  Umbilical hernia  I discussed the indications and complications of hernia surgery with the patient.  I discussed both the laparoscopic and open approach to hernia repair..  The potential risks of hernia surgery include, but are not limited to, bleeding, infection, open surgery, nerve injury, and recurrence of the hernia.  I have provided the patient literature about hernia surgery at the last visit.  Ulcer at the bottom of her umbilicus.  CT scan 04/12/2012 - large, fat containing hernia - 7 x 6 x 7.3 cm  I think she should have the hernia repaired and have her umbilicus removed.  She wants to talk to work.  She will be out of work x 2 weeks with light duty or 4 weeks if she has lifting > 15 pounds.  She will call back.  2.  History of seizures  Last seizure was 1972.  She is on no meds 3.  History of migraine headaches x 4 years.  Takes Imitrex 4.  History of depression 5.  Asthma  Does not smoke 6.  Very poor dentition 7.  Morbid obesity - weight - 246, BMI - 39.7   I talked about how weight will impact on her long term course.  It is important she get her weight below 200 pounds long term.  I think she understands the reasons, but whether she can do it is another question. 8.  GERD x 4 years. 9.  Hypothyroid - on replacement x 2 years.  [Patient has platelet count 127,000.  Reason unclear.  DN  06/27/2012]  Chief Complaint  Patient presents with  . Follow-up    recheck hernia   REFERRING PHYSICIAN: Boykin Nearing, MD  HISTORY OF PRESENT ILLNESS: Holly Mueller is a 46 y.o. (DOB: 08-13-1965)  white female whose primary care physician is Boykin Nearing, MD and comes to me today for bleeding from umbilicus and possible umbilical hernia.  She has had some peri-umbilical pain for about 3 weeks.  About 1 week about ago, she noticed some drainage from her umbilicus.  Then this past Tuesday,  03/29/2012, she went to the [facility] for bleeding from her umbilicus.    Past Medical History  Diagnosis Date  . Asthma 2004  . GERD (gastroesophageal reflux disease) 2008  . Epilepsy in 1972    No seizures since 1972. Previously treated with phenobarbital.   . Hypothyroidism 2008  . Seasonal allergies 2003  . Arthritis       Past Surgical History  Procedure Date  . Novasure ablation   . C section     x2  . R shoulder scope 2011  . Cesarean section     x2    Current Outpatient Prescriptions  Medication Sig Dispense Refill  . albuterol (PROVENTIL HFA;VENTOLIN HFA) 108 (90 BASE) MCG/ACT inhaler Inhale 2 puffs into the lungs every 6 (six) hours as needed.      Marland Kitchen amitriptyline (ELAVIL) 10 MG tablet Take 20 mg by mouth at bedtime.      . budesonide-formoterol (SYMBICORT) 160-4.5 MCG/ACT inhaler Inhale 2 puffs into the lungs 2 (two) times daily.  1 Inhaler  6  . diclofenac (VOLTAREN) 25 MG EC tablet Take 25 mg by mouth 2 (two) times daily. Pt unsure of actual dosage      . esomeprazole (NEXIUM) 20 MG capsule Take 1 capsule (20 mg total) by mouth daily before breakfast.  30  capsule  11  . fluticasone (FLONASE) 50 MCG/ACT nasal spray Place 2 sprays into the nose daily.  16 g  6  . HYDROcodone-acetaminophen (VICODIN) 5-500 MG per tablet Take 1-2 tablets by mouth every 6 (six) hours as needed for pain.  10 tablet  0  . levothyroxine (SYNTHROID, LEVOTHROID) 300 MCG tablet Take 1 tablet (300 mcg total) by mouth daily.  90 tablet  1  . SUMAtriptan (IMITREX) 50 MG tablet Take 1 tablet (50 mg total) by mouth every 2 (two) hours as needed for migraine.  10 tablet  0      Allergies  Allergen Reactions  . Aspirin Nausea Only    REVIEW OF SYSTEMS: Skin:  No history of rash.  No history of abnormal moles. Infection:  Very poor dental care.  She has inflammation (possible abscess) along right jaw. Neurologic:  History of seizures, but her last one was in 1972.  History of migraine  headaches. Cardiac:  No history of hypertension. No history of heart disease.  No history of prior cardiac catheterization.  No history of seeing a cardiologist. Pulmonary:  Does not smoke cigarettes.  No asthma or bronchitis.  No OSA/CPAP.  Endocrine:  No diabetes. Hypothyroid, corrected. Gastrointestinal:  No history of stomach disease.  No history of liver disease.  No history of gall bladder disease.  No history of pancreas disease.  No history of colon disease. Urologic:  No history of kidney stones.  No history of bladder infections. Musculoskeletal:  History of right shoulder surgery. Hematologic:  No bleeding disorder.  No history of anemia.  Not anticoagulated. Psycho-social:  The patient is oriented.   History of depression.  SOCIAL and FAMILY HISTORY: Unmarried. Has 2 children - 12 and 14. Works in [employer] at [place]: BP 110/78  Pulse 72  Temp 97.5 F (36.4 C)  Resp 18  Ht _0  (1.676 m)  Wt 242 lb (109.77 kg)  BMI 39.06 kg/m2  General: Obese WF who is alert and generally healthy appearing.  HEENT: Normal. Pupils equal.  Very poor dentition. Neck: Supple. No mass.  No thyroid mass. Lymph Nodes:  No supraclavicular or cervical nodes. Lungs: Clear to auscultation and symmetric breath sounds. Heart:  RRR. No murmur or rub.  Abdomen: Soft. No mass. Normal bowel sounds.  Pfannenstiel incision.  Has deep umbilicus with an ulcer at the bottom of the umbilicus.  The umbilicus looks worse this time than when I last saw. She is more apple than pear and has a significant pannus.  I discussed how this will impact surgery and complications. Extremities:  Good strength and ROM  in upper and lower extremities. Neurologic:  Grossly intact to motor and sensory function. Psychiatric: Has normal mood and affect. Behavior is normal.   DATA REVIEWED: CT scan.  Results given to the patient.  Alphonsa Overall, MD,  [facility], [address]   [place], [place]    [place] Phone:  [phone] FAX:  [phone]

## 2012-05-05 ENCOUNTER — Telehealth (INDEPENDENT_AMBULATORY_CARE_PROVIDER_SITE_OTHER): Payer: Self-pay | Admitting: General Surgery

## 2012-05-05 NOTE — Telephone Encounter (Signed)
Patient states she was to call Glenda to let her know if she wanted to proceed with surgery. Patient states she would like to be scheduled after the 16th of January. Please forward information to scheduling.

## 2012-05-05 NOTE — Telephone Encounter (Signed)
Requested order from Dr. Lucia Gaskins for Hernia sx after Jan 16

## 2012-05-13 ENCOUNTER — Other Ambulatory Visit (INDEPENDENT_AMBULATORY_CARE_PROVIDER_SITE_OTHER): Payer: Self-pay | Admitting: Surgery

## 2012-05-13 NOTE — Telephone Encounter (Signed)
Order gave to scheduling

## 2012-05-16 ENCOUNTER — Telehealth (INDEPENDENT_AMBULATORY_CARE_PROVIDER_SITE_OTHER): Payer: Self-pay

## 2012-05-16 NOTE — Telephone Encounter (Signed)
Voice mail- scheduling has her orders for surgery and they will contact her with her surgery date in the next couple of days if not advised her to call

## 2012-05-23 ENCOUNTER — Other Ambulatory Visit: Payer: Self-pay | Admitting: Family Medicine

## 2012-05-23 MED ORDER — SUMATRIPTAN SUCCINATE 50 MG PO TABS: 50.0000 mg | ORAL_TABLET | ORAL | Status: DC

## 2012-05-26 ENCOUNTER — Emergency Department (HOSPITAL_COMMUNITY): Payer: 59

## 2012-05-26 ENCOUNTER — Emergency Department (HOSPITAL_COMMUNITY)
Admission: EM | Admit: 2012-05-26 | Discharge: 2012-05-26 | Disposition: A | Discharge status: Home / Self Care | Payer: 59 | Attending: Emergency Medicine | Admitting: Emergency Medicine

## 2012-05-26 ENCOUNTER — Encounter (HOSPITAL_COMMUNITY): Payer: Self-pay | Admitting: *Deleted

## 2012-05-26 DIAGNOSIS — K219 Gastro-esophageal reflux disease without esophagitis: Secondary | ICD-10-CM | POA: Insufficient documentation

## 2012-05-26 DIAGNOSIS — J309 Allergic rhinitis, unspecified: Secondary | ICD-10-CM | POA: Insufficient documentation

## 2012-05-26 DIAGNOSIS — M129 Arthropathy, unspecified: Secondary | ICD-10-CM | POA: Insufficient documentation

## 2012-05-26 DIAGNOSIS — IMO0002 Reserved for concepts with insufficient information to code with codable children: Secondary | ICD-10-CM | POA: Insufficient documentation

## 2012-05-26 DIAGNOSIS — K529 Noninfective gastroenteritis and colitis, unspecified: Secondary | ICD-10-CM

## 2012-05-26 DIAGNOSIS — Z791 Long term (current) use of non-steroidal anti-inflammatories (NSAID): Secondary | ICD-10-CM | POA: Insufficient documentation

## 2012-05-26 DIAGNOSIS — Z8669 Personal history of other diseases of the nervous system and sense organs: Secondary | ICD-10-CM | POA: Insufficient documentation

## 2012-05-26 DIAGNOSIS — K5289 Other specified noninfective gastroenteritis and colitis: Secondary | ICD-10-CM | POA: Insufficient documentation

## 2012-05-26 DIAGNOSIS — J45909 Unspecified asthma, uncomplicated: Secondary | ICD-10-CM | POA: Insufficient documentation

## 2012-05-26 DIAGNOSIS — E039 Hypothyroidism, unspecified: Secondary | ICD-10-CM | POA: Insufficient documentation

## 2012-05-26 DIAGNOSIS — R112 Nausea with vomiting, unspecified: Secondary | ICD-10-CM | POA: Insufficient documentation

## 2012-05-26 DIAGNOSIS — Z79899 Other long term (current) drug therapy: Secondary | ICD-10-CM | POA: Insufficient documentation

## 2012-05-26 DIAGNOSIS — Z8742 Personal history of other diseases of the female genital tract: Secondary | ICD-10-CM | POA: Insufficient documentation

## 2012-05-26 LAB — CBC WITH DIFFERENTIAL/PLATELET
HCT: 41.9 % (ref 36.0–46.0)
Hemoglobin: 14.3 g/dL (ref 12.0–15.0)
Lymphocytes Relative: 18 % (ref 12–46)
Lymphs Abs: 0.8 10*3/uL (ref 0.7–4.0)
MCHC: 34.1 g/dL (ref 30.0–36.0)
Monocytes Absolute: 0.4 10*3/uL (ref 0.1–1.0)
Monocytes Relative: 8 % (ref 3–12)
Neutro Abs: 3.1 10*3/uL (ref 1.7–7.7)
RBC: 4.87 MIL/uL (ref 3.87–5.11)
WBC: 4.5 10*3/uL (ref 4.0–10.5)

## 2012-05-26 LAB — COMPREHENSIVE METABOLIC PANEL
Alkaline Phosphatase: 198 U/L — ABNORMAL HIGH (ref 39–117)
BUN: 9 mg/dL (ref 6–23)
CO2: 23 mEq/L (ref 19–32)
Chloride: 102 mEq/L (ref 96–112)
Creatinine, Ser: 0.65 mg/dL (ref 0.50–1.10)
GFR calc non Af Amer: >90 mL/min (ref >90)
Glucose, Bld: 79 mg/dL (ref 70–99)
Total Bilirubin: 0.8 mg/dL (ref 0.3–1.2)

## 2012-05-26 LAB — URINALYSIS, ROUTINE W REFLEX MICROSCOPIC
Bilirubin Urine: NEGATIVE
Ketones, ur: NEGATIVE mg/dL
Leukocytes, UA: NEGATIVE
Nitrite: NEGATIVE
Protein, ur: NEGATIVE mg/dL

## 2012-05-26 LAB — URINE MICROSCOPIC-ADD ON

## 2012-05-26 LAB — LIPASE, BLOOD: Lipase: 32 U/L (ref 11–59)

## 2012-05-26 LAB — RAPID URINE DRUG SCREEN, HOSP PERFORMED
Amphetamines: NOT DETECTED
Barbiturates: NOT DETECTED

## 2012-05-26 MED ORDER — HYDROCODONE-ACETAMINOPHEN 5-325 MG PO TABS: 2.0000 | ORAL_TABLET | ORAL | Status: DC | PRN

## 2012-05-26 MED ORDER — ONDANSETRON 4 MG PO TBDP: 4.0000 mg | ORAL_TABLET | Freq: Three times a day (TID) | ORAL | Status: DC | PRN

## 2012-05-26 MED ORDER — ALUM & MAG HYDROXIDE-SIMETH 200-200-20 MG/5ML PO SUSP: 10.0000 mL | Freq: Four times a day (QID) | ORAL | Status: DC | PRN

## 2012-05-26 MED ORDER — SODIUM CHLORIDE 0.9 % IV BOLUS (SEPSIS)
1000.0000 mL | Freq: Once | INTRAVENOUS | Status: AC
  Administered 2012-05-26: 1000 mL via INTRAVENOUS

## 2012-05-26 MED ORDER — ONDANSETRON HCL 4 MG/2ML IJ SOLN
4.0000 mg | Freq: Once | INTRAMUSCULAR | Status: AC
  Administered 2012-05-26: 4 mg via INTRAVENOUS
  Filled 2012-05-26: qty 2

## 2012-05-26 MED ORDER — MORPHINE SULFATE 4 MG/ML IJ SOLN
4.0000 mg | Freq: Once | INTRAMUSCULAR | Status: AC
  Administered 2012-05-26: 4 mg via INTRAVENOUS
  Filled 2012-05-26: qty 1

## 2012-05-26 NOTE — ED Notes (Signed)
Assisted the patient to the bathroom.

## 2012-05-26 NOTE — ED Provider Notes (Signed)
History     CSN: [id number]  Arrival date & time 05/26/12  1016   First MD Initiated Contact with Patient 05/26/12 1022      Chief Complaint  Patient presents with  . Abdominal Pain    n/v/d    (Consider location/radiation/quality/duration/timing/severity/associated sxs/prior treatment) HPI Comments: Patient is a 47 year old female with a past medical history of GERD and uterine ablation who presents with abdominal pain that started this morning. The pain is located in her generalized abdomen and does not radiate. The pain is described as cramping and severe. The pain started gradually and progressively worsened since the onset. No alleviating/aggravating factors. The patient has tried nothing for symptoms without relief. Associated symptoms include nausea, vomiting, diarrhea. Patient denies fever, headache, chest pain, SOB, dysuria, constipation, abnormal vaginal bleeding/discharge.      Past Medical History  Diagnosis Date  . Asthma 2004  . GERD (gastroesophageal reflux disease) 2008  . Epilepsy in 1972    No seizures since 1972. Previously treated with phenobarbital.   . Hypothyroidism 2008  . Seasonal allergies 2003  . Arthritis     Past Surgical History  Procedure Date  . Novasure ablation   . C section     x2  . R shoulder scope 2011  . Cesarean section     x2    Family History  Problem Relation Age of Onset  . Hypertension Mother   . Diabetes Mother   . Lung cancer Father   . Stroke Father   . Diabetes Sister   . Hypertension Sister     History  Substance Use Topics  . Smoking status: Never Smoker   . Smokeless tobacco: Never Used  . Alcohol Use: 1.5 oz/week    3 drink(s) per week     Comment: special occasions.     OB History    Grav Para Term Preterm Abortions TAB SAB Ect Mult Living                  Review of Systems  Gastrointestinal: Positive for nausea, vomiting, abdominal pain and diarrhea.  All other systems reviewed and are  negative.    Allergies  Aspirin  Home Medications   Current Outpatient Rx  Name  Route  Sig  Dispense  Refill  . ALBUTEROL SULFATE HFA 108 (90 BASE) MCG/ACT IN AERS   Inhalation   Inhale 2 puffs into the lungs every 6 (six) hours as needed. Shortness of breath         . AMITRIPTYLINE HCL 10 MG PO TABS   Oral   Take 20 mg by mouth at bedtime.         . BUDESONIDE-FORMOTEROL FUMARATE 160-4.5 MCG/ACT IN AERO   Inhalation   Inhale 2 puffs into the lungs 2 (two) times daily.         Marland Kitchen DICLOFENAC SODIUM 25 MG PO TBEC   Oral   Take 25 mg by mouth 2 (two) times daily. Pt unsure of actual dosage         . ESOMEPRAZOLE MAGNESIUM 20 MG PO CPDR   Oral   Take 1 capsule (20 mg total) by mouth daily before breakfast.   30 capsule   11   . FLUTICASONE PROPIONATE 50 MCG/ACT NA SUSP   Nasal   Place 2 sprays into the nose daily.   16 g   6   . LEVOTHYROXINE SODIUM 300 MCG PO TABS   Oral   Take 1 tablet (300  mcg total) by mouth daily.   90 tablet   1   . SUMATRIPTAN SUCCINATE 50 MG PO TABS   Oral   Take 1 tablet (50 mg total) by mouth once a week. May take an additional  50 mg tablet in 2 hrs if migraine persist   10 tablet   0     BP 115/80  Pulse 73  Temp 98.5 F (36.9 C) (Oral)  Resp 20  SpO2 100%  Physical Exam  Nursing note and vitals reviewed. Constitutional: She is oriented to person, place, and time. She appears well-developed and well-nourished. No distress.  HENT:  Head: Normocephalic and atraumatic.  Eyes: Conjunctivae normal and EOM are normal. Pupils are equal, round, and reactive to light. No scleral icterus.  Neck: Normal range of motion. Neck supple.  Cardiovascular: Normal rate and regular rhythm.  Exam reveals no gallop and no friction rub.   No murmur heard. Pulmonary/Chest: Effort normal and breath sounds normal. She has no wheezes. She has no rales. She exhibits no tenderness.  Abdominal: Soft. She exhibits no distension. There is  tenderness. There is no rebound and no guarding.       Mild generalized tenderness to palpation.   Genitourinary:       CVA tenderness bilaterally.   Musculoskeletal: Normal range of motion.  Neurological: She is alert and oriented to person, place, and time. Coordination normal.       Speech is goal-oriented. Moves limbs without ataxia.   Skin: Skin is warm and dry.  Psychiatric: She has a normal mood and affect. Her behavior is normal.    ED Course  Procedures (including critical care time)  Labs Reviewed  CBC WITH DIFFERENTIAL - Abnormal; Notable for the following:    Platelets 130 (*)     All other components within normal limits  COMPREHENSIVE METABOLIC PANEL - Abnormal; Notable for the following:    Total Protein 10.3 (*)     AST 68 (*)     ALT 70 (*)     Alkaline Phosphatase 198 (*)     All other components within normal limits  URINALYSIS, ROUTINE W REFLEX MICROSCOPIC - Abnormal; Notable for the following:    Hgb urine dipstick LARGE (*)     All other components within normal limits  LIPASE, BLOOD  URINE RAPID DRUG SCREEN (HOSP PERFORMED)  URINE MICROSCOPIC-ADD ON   Ct Abdomen Pelvis Wo Contrast  05/26/2012  *RADIOLOGY REPORT*  Clinical Data: Severe mid abdominal pain, nausea, vomiting, diarrhea, evaluate for renal stone  CT ABDOMEN AND PELVIS WITHOUT CONTRAST  Technique:  Multidetector CT imaging of the abdomen and pelvis was performed following the standard protocol without intravenous contrast.  Comparison: 04/12/2012; 05/17/10/2007  Findings:  The lack of intravenous contrast limits the ability to evaluate solid abdominal organs.  Several nonobstructing punctate right-sided renal stones are noted with index stone within the superior pole of the right kidney measuring 2 mm in diameter (image 37, series 2).  There is a solitary punctate (102 mm) nonobstructing stone in the mid aspect of the left kidney (coronal image 57).  No stones are seen within the expected location of  either ureter or the urinary bladder.  No perinephric stranding.  There is unchanged mild thickening the crux of the left adrenal gland.  Normal noncontrast appearance of the right adrenal gland and pancreas.  The spleen remains enlarged.  Suspected minimal increase in previously characterized splenic cyst, now measuring approximately 3.8 x 2.9 cm, previously, 2.7 x  2.1 cm.  Note is again made of a punctate calcification adjacent to the anterior aspect of the spleen.  Normal hepatic contour.  Normal noncontrast appearance of the gallbladder.  No ascites.  Colonic diverticulosis without evidence of diverticulitis.  The bowel is otherwise normal in course and caliber without wall thickening or evidence of obstruction.  Normal noncontrast appearance of the appendix given the patient motion.  No pneumoperitoneum, pneumatosis or portal venous gas.  Potential minimal increase in size of mesenteric fat containing periumbilical hernia whose neck measures approximately 3.8 cm in diameter (image 58, series 2) with the hernia sac now measuring approximately 8.1 x 6.5 cm (image 65), previously, 7.4 x 6.0 cm. There is a grossly unchanged adjacent smaller mesenteric fat containing hernia within the lower aspect of the midline of the abdomen.  Porta hepatis adenopathy is grossly unchanged with index nodes measuring approximately 1.8 and 1.3 cm in short axis diameter (images 28 and 25 respectively).  Unchanged enlarged pericaval lymph node measuring 1.3 cm (image 38, series 2). These enlarged lymph nodes are grossly unchanged since 2007 examination and presumably reactive in etiology.  Normal caliber of the abdominal aorta.  Trace amount of presumably physiologic fluid within the endometrial canal.  No discrete adnexal lesion on this noncontrast examination. No free fluid in the pelvis.  Limited visualization of the lower thorax demonstrates persistent scattered ground-glass opacities within the imaged portions of the right middle  lobe, lingula and within the medial basilar segment right lower lobe.  Minimal dependent subpleural atelectasis within the left lower lobe.  No focal airspace opacities.  No pleural effusion.  Borderline cardiomegaly.  No acute or aggressive osseous abnormalities.  Bilateral L5 pars defects with associated grade 1 anterolisthesis of L5 upon S1 measuring approximately 4 mm.  Mild DDD of L5 - S1.  IMPRESSION:  1.  Query very minimal increase in size of known mesenteric fat containing ventral abdominal hernia.  Otherwise, no explanation for patient age severe abdominal pain.  Specifically, no evidence of enteric urinary obstruction. 2.  Bilateral punctate nonobstructing nephrolithiasis. 3.  Unchanged enlarged porta hepatis and retroperitoneal lymph nodes, grossly stable since the 2007 examination and thus favored to be reactive in etiology. 4.  Splenomegaly.  Potential minimal increase in size of previously characterized splenic cyst. 5.  Bilateral L5 pars defects with associated grade 1 anterolisthesis of L5 upon S1.   Original Report Authenticated By: Jake Seats, MD      1. Colitis       MDM  12:39 PM Patient now resting comfortably after receiving morphine. Labs unremarkable. Urinalysis shows hemoglobin. I will CT without contrast to evaluation for kidney stone.   1:43 PM Patient feels much better. CT scan unremarkable. Patient will be discharged with symptomatic treatment. Patient afebrile and non toxic appearing. No further evaluation needed at this time.       Alvina Chou, PA-C 05/26/12 1350

## 2012-05-26 NOTE — ED Provider Notes (Signed)
Medical screening examination/treatment/procedure(s) were performed by non-physician practitioner and as supervising physician I was immediately available for consultation/collaboration.   Hoy Morn, MD 05/26/12 [phone]

## 2012-05-26 NOTE — ED Notes (Signed)
Per EMS, pt from home, reports severe mid-abd pain with n/v/d.  Has abd hernia-pending surgery next month.  Mostly c/o diarrhea.

## 2012-06-21 ENCOUNTER — Encounter (HOSPITAL_COMMUNITY): Payer: Self-pay | Admitting: Pharmacy Technician

## 2012-06-23 ENCOUNTER — Inpatient Hospital Stay (HOSPITAL_COMMUNITY): Admission: RE | Admit: 2012-06-23 | Payer: Self-pay | Source: Ambulatory Visit

## 2012-06-24 NOTE — Patient Instructions (Addendum)
Holly Mueller  06/24/2012   Your procedure is scheduled on:  06/30/12   Report to [place] at  0700  AM.  Call this number if you have problems the morning of surgery: [phone]   Remember:   Do not eat food or drink liquids after midnight.   Take these medicines the morning of surgery with A SIP OF WATER:    Do not wear jewelry, make-up or nail polish.  Do not wear lotions, powders, or perfumes.   Do not shave 48 hours prior to surgery.  Do not bring valuables to the hospital.  Contacts, dentures or bridgework may not be worn into surgery.  Leave suitcase in the car. After surgery it may be brought to your room.  For patients admitted to the hospital, checkout time is 11:00 AM the day of  discharge.     SEE CHG INSTRUCTION SHEET    Please read over the following fact sheets that you were given: MRSA Information, coughing and deep breathing exercises, leg exercises               Failure to comply with these instructions may result in cancellation of your surgery.                Patient Signature ____________________________              Nurse Signature _____________________________

## 2012-06-25 ENCOUNTER — Other Ambulatory Visit: Payer: Self-pay

## 2012-06-27 ENCOUNTER — Encounter (HOSPITAL_COMMUNITY)
Admission: RE | Admit: 2012-06-27 | Discharge: 2012-06-27 | Disposition: A | Discharge status: Home / Self Care | Payer: 59 | Source: Ambulatory Visit | Attending: Surgery | Admitting: Surgery

## 2012-06-27 ENCOUNTER — Encounter (HOSPITAL_COMMUNITY): Payer: Self-pay

## 2012-06-27 HISTORY — DX: Headache: R51

## 2012-06-27 LAB — CBC
Hemoglobin: 12.7 g/dL (ref 12.0–15.0)
MCH: 29.8 pg (ref 26.0–34.0)
MCHC: 34.9 g/dL (ref 30.0–36.0)
MCV: 85.4 fL (ref 78.0–100.0)
RBC: 4.26 MIL/uL (ref 3.87–5.11)

## 2012-06-27 LAB — SURGICAL PCR SCREEN: MRSA, PCR: NEGATIVE

## 2012-06-27 NOTE — Progress Notes (Signed)
EKG 08/19/11 EPIC  CT 1/16 14 EPIC

## 2012-06-27 NOTE — Progress Notes (Signed)
CBC results routed to Inbox of Dr Nydia Bouton

## 2012-06-30 ENCOUNTER — Encounter (HOSPITAL_COMMUNITY): Payer: Self-pay | Admitting: *Deleted

## 2012-06-30 ENCOUNTER — Inpatient Hospital Stay (HOSPITAL_COMMUNITY)
Admission: RE | Admit: 2012-06-30 | Discharge: 2012-07-02 | DRG: 355 | Disposition: A | Discharge status: Home / Self Care | Payer: 59 | Source: Ambulatory Visit | Attending: Surgery | Admitting: Surgery

## 2012-06-30 ENCOUNTER — Other Ambulatory Visit (INDEPENDENT_AMBULATORY_CARE_PROVIDER_SITE_OTHER): Payer: Self-pay | Admitting: Surgery

## 2012-06-30 ENCOUNTER — Encounter (HOSPITAL_COMMUNITY)
Admission: RE | Disposition: A | Discharge status: Home / Self Care | Payer: Self-pay | Source: Ambulatory Visit | Attending: Surgery

## 2012-06-30 ENCOUNTER — Ambulatory Visit (HOSPITAL_COMMUNITY): Payer: 59 | Admitting: Anesthesiology

## 2012-06-30 ENCOUNTER — Encounter (HOSPITAL_COMMUNITY): Payer: Self-pay | Admitting: Anesthesiology

## 2012-06-30 DIAGNOSIS — G43909 Migraine, unspecified, not intractable, without status migrainosus: Secondary | ICD-10-CM | POA: Diagnosis present

## 2012-06-30 DIAGNOSIS — E039 Hypothyroidism, unspecified: Secondary | ICD-10-CM | POA: Diagnosis present

## 2012-06-30 DIAGNOSIS — L98499 Non-pressure chronic ulcer of skin of other sites with unspecified severity: Secondary | ICD-10-CM

## 2012-06-30 DIAGNOSIS — F329 Major depressive disorder, single episode, unspecified: Secondary | ICD-10-CM | POA: Diagnosis present

## 2012-06-30 DIAGNOSIS — J45909 Unspecified asthma, uncomplicated: Secondary | ICD-10-CM | POA: Diagnosis present

## 2012-06-30 DIAGNOSIS — F3289 Other specified depressive episodes: Secondary | ICD-10-CM | POA: Diagnosis present

## 2012-06-30 DIAGNOSIS — K429 Umbilical hernia without obstruction or gangrene: Secondary | ICD-10-CM

## 2012-06-30 DIAGNOSIS — K436 Other and unspecified ventral hernia with obstruction, without gangrene: Secondary | ICD-10-CM | POA: Diagnosis present

## 2012-06-30 DIAGNOSIS — Z6839 Body mass index (BMI) 39.0-39.9, adult: Secondary | ICD-10-CM

## 2012-06-30 DIAGNOSIS — Z79899 Other long term (current) drug therapy: Secondary | ICD-10-CM

## 2012-06-30 DIAGNOSIS — K420 Umbilical hernia with obstruction, without gangrene: Secondary | ICD-10-CM

## 2012-06-30 DIAGNOSIS — K219 Gastro-esophageal reflux disease without esophagitis: Secondary | ICD-10-CM | POA: Diagnosis present

## 2012-06-30 DIAGNOSIS — D696 Thrombocytopenia, unspecified: Secondary | ICD-10-CM | POA: Diagnosis present

## 2012-06-30 DIAGNOSIS — K7460 Unspecified cirrhosis of liver: Secondary | ICD-10-CM

## 2012-06-30 DIAGNOSIS — R7989 Other specified abnormal findings of blood chemistry: Secondary | ICD-10-CM | POA: Diagnosis present

## 2012-06-30 HISTORY — PX: LIVER BIOPSY: SHX301

## 2012-06-30 HISTORY — PX: UMBILICAL HERNIA REPAIR: SHX196

## 2012-06-30 HISTORY — PX: VENTRAL HERNIA REPAIR: SHX424

## 2012-06-30 SURGERY — REPAIR, HERNIA, VENTRAL, LAPAROSCOPIC
Anesthesia: General | Site: Abdomen | Wound class: Clean

## 2012-06-30 MED ORDER — MIDAZOLAM HCL 5 MG/5ML IJ SOLN
INTRAMUSCULAR | Status: DC | PRN
  Administered 2012-06-30: 2 mg via INTRAVENOUS

## 2012-06-30 MED ORDER — ONDANSETRON HCL 4 MG/2ML IJ SOLN
4.0000 mg | Freq: Four times a day (QID) | INTRAMUSCULAR | Status: DC | PRN
  Administered 2012-06-30: 4 mg via INTRAVENOUS
  Filled 2012-06-30: qty 2

## 2012-06-30 MED ORDER — LEVOTHYROXINE SODIUM 150 MCG PO TABS
300.0000 ug | ORAL_TABLET | Freq: Every day | ORAL | Status: DC
  Administered 2012-07-01 – 2012-07-02 (×2): 300 ug via ORAL
  Filled 2012-06-30 (×3): qty 2

## 2012-06-30 MED ORDER — PROMETHAZINE HCL 25 MG/ML IJ SOLN: 6.2500 mg | INTRAMUSCULAR | Status: DC | PRN

## 2012-06-30 MED ORDER — PHENYLEPHRINE HCL 10 MG/ML IJ SOLN
INTRAMUSCULAR | Status: DC | PRN
  Administered 2012-06-30 (×3): 40 ug via INTRAVENOUS

## 2012-06-30 MED ORDER — KCL IN DEXTROSE-NACL 20-5-0.45 MEQ/L-%-% IV SOLN
INTRAVENOUS | Status: DC
  Administered 2012-06-30 – 2012-07-02 (×5): via INTRAVENOUS
  Filled 2012-06-30 (×6): qty 1000

## 2012-06-30 MED ORDER — ONDANSETRON HCL 4 MG/2ML IJ SOLN
INTRAMUSCULAR | Status: DC | PRN
  Administered 2012-06-30: 4 mg via INTRAVENOUS

## 2012-06-30 MED ORDER — HYDROMORPHONE HCL PF 1 MG/ML IJ SOLN
INTRAMUSCULAR | Status: AC
  Administered 2012-06-30: 0.5 mg
  Filled 2012-06-30: qty 1

## 2012-06-30 MED ORDER — GLYCOPYRROLATE 0.2 MG/ML IJ SOLN
INTRAMUSCULAR | Status: DC | PRN
  Administered 2012-06-30: .7 mg via INTRAVENOUS

## 2012-06-30 MED ORDER — HEPARIN SODIUM (PORCINE) 5000 UNIT/ML IJ SOLN
5000.0000 [IU] | Freq: Three times a day (TID) | INTRAMUSCULAR | Status: DC
  Administered 2012-06-30 – 2012-07-02 (×5): 5000 [IU] via SUBCUTANEOUS
  Filled 2012-06-30 (×8): qty 1

## 2012-06-30 MED ORDER — CEFAZOLIN SODIUM-DEXTROSE 2-3 GM-% IV SOLR
2.0000 g | INTRAVENOUS | Status: AC
  Administered 2012-06-30: 2 g via INTRAVENOUS

## 2012-06-30 MED ORDER — 0.9 % SODIUM CHLORIDE (POUR BTL) OPTIME
TOPICAL | Status: DC | PRN
  Administered 2012-06-30: 1000 mL

## 2012-06-30 MED ORDER — HYDROMORPHONE HCL PF 1 MG/ML IJ SOLN
INTRAMUSCULAR | Status: DC | PRN
  Administered 2012-06-30 (×2): 1 mg via INTRAVENOUS

## 2012-06-30 MED ORDER — ACETAMINOPHEN 10 MG/ML IV SOLN
1000.0000 mg | Freq: Four times a day (QID) | INTRAVENOUS | Status: DC
  Administered 2012-06-30: 1000 mg via INTRAVENOUS
  Filled 2012-06-30: qty 100

## 2012-06-30 MED ORDER — FLUTICASONE PROPIONATE 50 MCG/ACT NA SUSP
2.0000 | Freq: Every day | NASAL | Status: DC
Start: 2012-06-30 — End: 2012-07-02
  Administered 2012-06-30 – 2012-07-01 (×2): 2 via NASAL
  Filled 2012-06-30: qty 16

## 2012-06-30 MED ORDER — ROCURONIUM BROMIDE 100 MG/10ML IV SOLN
INTRAVENOUS | Status: DC | PRN
  Administered 2012-06-30: 10 mg via INTRAVENOUS
  Administered 2012-06-30: 50 mg via INTRAVENOUS

## 2012-06-30 MED ORDER — LIDOCAINE HCL (CARDIAC) 20 MG/ML IV SOLN
INTRAVENOUS | Status: DC | PRN
  Administered 2012-06-30: 50 mg via INTRAVENOUS

## 2012-06-30 MED ORDER — LACTATED RINGERS IV SOLN
INTRAVENOUS | Status: DC | PRN
  Administered 2012-06-30 (×2): via INTRAVENOUS

## 2012-06-30 MED ORDER — HEPARIN SODIUM (PORCINE) 5000 UNIT/ML IJ SOLN
5000.0000 [IU] | Freq: Once | INTRAMUSCULAR | Status: AC
  Administered 2012-06-30: 5000 [IU] via SUBCUTANEOUS
  Filled 2012-06-30: qty 1

## 2012-06-30 MED ORDER — NAPROXEN 500 MG PO TABS
500.0000 mg | ORAL_TABLET | Freq: Three times a day (TID) | ORAL | Status: DC | PRN
  Administered 2012-07-02: 500 mg via ORAL
  Filled 2012-06-30 (×2): qty 1

## 2012-06-30 MED ORDER — BUDESONIDE-FORMOTEROL FUMARATE 160-4.5 MCG/ACT IN AERO
2.0000 | INHALATION_SPRAY | Freq: Two times a day (BID) | RESPIRATORY_TRACT | Status: DC
  Administered 2012-06-30 – 2012-07-02 (×4): 2 via RESPIRATORY_TRACT
  Filled 2012-06-30: qty 6

## 2012-06-30 MED ORDER — FENTANYL CITRATE 0.05 MG/ML IJ SOLN
INTRAMUSCULAR | Status: DC | PRN
  Administered 2012-06-30 (×3): 50 ug via INTRAVENOUS
  Administered 2012-06-30: 100 ug via INTRAVENOUS

## 2012-06-30 MED ORDER — KETOROLAC TROMETHAMINE 30 MG/ML IJ SOLN: 15.0000 mg | Freq: Once | INTRAMUSCULAR | Status: DC | PRN

## 2012-06-30 MED ORDER — AMITRIPTYLINE HCL 10 MG PO TABS
20.0000 mg | ORAL_TABLET | Freq: Every day | ORAL | Status: DC
  Administered 2012-06-30 – 2012-07-01 (×2): 20 mg via ORAL
  Filled 2012-06-30 (×3): qty 2

## 2012-06-30 MED ORDER — LACTATED RINGERS IV SOLN: INTRAVENOUS | Status: DC

## 2012-06-30 MED ORDER — NEOSTIGMINE METHYLSULFATE 1 MG/ML IJ SOLN
INTRAMUSCULAR | Status: DC | PRN
  Administered 2012-06-30: 4 mg via INTRAVENOUS

## 2012-06-30 MED ORDER — PANTOPRAZOLE SODIUM 40 MG PO TBEC
40.0000 mg | DELAYED_RELEASE_TABLET | Freq: Every day | ORAL | Status: DC
  Administered 2012-07-01 – 2012-07-02 (×2): 40 mg via ORAL
  Filled 2012-06-30 (×3): qty 1

## 2012-06-30 MED ORDER — BUPIVACAINE HCL (PF) 0.25 % IJ SOLN
INTRAMUSCULAR | Status: AC
  Filled 2012-06-30: qty 30

## 2012-06-30 MED ORDER — BUPIVACAINE HCL (PF) 0.25 % IJ SOLN
INTRAMUSCULAR | Status: DC | PRN
  Administered 2012-06-30: 20 mL

## 2012-06-30 MED ORDER — SUMATRIPTAN SUCCINATE 50 MG PO TABS
50.0000 mg | ORAL_TABLET | Freq: Every day | ORAL | Status: DC | PRN
  Filled 2012-06-30: qty 1

## 2012-06-30 MED ORDER — MORPHINE SULFATE 2 MG/ML IJ SOLN
1.0000 mg | INTRAMUSCULAR | Status: DC | PRN
  Administered 2012-06-30: 2 mg via INTRAVENOUS
  Administered 2012-06-30 – 2012-07-01 (×2): 4 mg via INTRAVENOUS
  Filled 2012-06-30: qty 2
  Filled 2012-06-30: qty 1
  Filled 2012-06-30: qty 2

## 2012-06-30 MED ORDER — PROPOFOL 10 MG/ML IV BOLUS
INTRAVENOUS | Status: DC | PRN
  Administered 2012-06-30: 200 mg via INTRAVENOUS

## 2012-06-30 MED ORDER — ONDANSETRON HCL 4 MG PO TABS: 4.0000 mg | ORAL_TABLET | Freq: Four times a day (QID) | ORAL | Status: DC | PRN

## 2012-06-30 MED ORDER — HYDROMORPHONE HCL PF 1 MG/ML IJ SOLN
0.2500 mg | INTRAMUSCULAR | Status: DC | PRN
  Administered 2012-06-30 (×2): 0.25 mg via INTRAVENOUS

## 2012-06-30 MED ORDER — CHLORHEXIDINE GLUCONATE 4 % EX LIQD
1.0000 "application " | Freq: Once | CUTANEOUS | Status: DC
  Filled 2012-06-30: qty 15

## 2012-06-30 MED ORDER — ALBUTEROL SULFATE HFA 108 (90 BASE) MCG/ACT IN AERS: 2.0000 | INHALATION_SPRAY | Freq: Four times a day (QID) | RESPIRATORY_TRACT | Status: DC | PRN

## 2012-06-30 MED ORDER — HYDROCODONE-ACETAMINOPHEN 5-325 MG PO TABS
1.0000 | ORAL_TABLET | ORAL | Status: DC | PRN
  Administered 2012-07-01 – 2012-07-02 (×5): 2 via ORAL
  Filled 2012-06-30 (×6): qty 2

## 2012-06-30 MED ORDER — CEFAZOLIN SODIUM-DEXTROSE 2-3 GM-% IV SOLR
INTRAVENOUS | Status: AC
  Filled 2012-06-30: qty 50

## 2012-06-30 SURGICAL SUPPLY — 79 items
ADH SKN CLS APL DERMABOND .7 (GAUZE/BANDAGES/DRESSINGS)
APL SKNCLS STERI-STRIP NONHPOA (GAUZE/BANDAGES/DRESSINGS)
BENZOIN TINCTURE PRP APPL 2/3 (GAUZE/BANDAGES/DRESSINGS) ×2 IMPLANT
BINDER ABD UNIV 12 45-62 (WOUND CARE) ×2 IMPLANT
BINDER ABDOMINAL 46IN 62IN (WOUND CARE) ×3
BLADE HEX COATED 2.75 (ELECTRODE) ×3 IMPLANT
BLADE SURG 15 STRL LF DISP TIS (BLADE) ×2 IMPLANT
BLADE SURG 15 STRL SS (BLADE) ×3
BLADE SURG SZ10 CARB STEEL (BLADE) ×3 IMPLANT
CANISTER SUCTION 2500CC (MISCELLANEOUS) ×3 IMPLANT
CLOTH BEACON ORANGE TIMEOUT ST (SAFETY) ×3 IMPLANT
DECANTER SPIKE VIAL GLASS SM (MISCELLANEOUS) ×3 IMPLANT
DERMABOND ADVANCED (GAUZE/BANDAGES/DRESSINGS)
DERMABOND ADVANCED .7 DNX12 (GAUZE/BANDAGES/DRESSINGS) IMPLANT
DEVICE SECURE STRAP 25 ABSORB (INSTRUMENTS) ×2 IMPLANT
DEVICE TROCAR PUNCTURE CLOSURE (ENDOMECHANICALS) ×3 IMPLANT
DISSECTOR BLUNT TIP ENDO 5MM (MISCELLANEOUS) IMPLANT
DRAIN CHANNEL RND F F (WOUND CARE) IMPLANT
DRAPE INCISE IOBAN 66X45 STRL (DRAPES) ×4 IMPLANT
DRAPE LAPAROSCOPIC ABDOMINAL (DRAPES) ×3 IMPLANT
DRAPE LAPAROTOMY T 102X78X121 (DRAPES) ×3 IMPLANT
DRSG TEGADERM 4X4.75 (GAUZE/BANDAGES/DRESSINGS) ×3 IMPLANT
ELECT REM PT RETURN 9FT ADLT (ELECTROSURGICAL) ×3
ELECTRODE REM PT RTRN 9FT ADLT (ELECTROSURGICAL) ×2 IMPLANT
EVACUATOR SILICONE 100CC (DRAIN) IMPLANT
GLOVE BIOGEL PI IND STRL 7.0 (GLOVE) ×2 IMPLANT
GLOVE BIOGEL PI INDICATOR 7.0 (GLOVE) ×1
GLOVE SURG SIGNA 7.5 PF LTX (GLOVE) ×4 IMPLANT
GOWN STRL NON-REIN LRG LVL3 (GOWN DISPOSABLE) ×3 IMPLANT
GOWN STRL REIN XL XLG (GOWN DISPOSABLE) ×8 IMPLANT
KIT BASIN OR (CUSTOM PROCEDURE TRAY) ×3 IMPLANT
MARKER SKIN DUAL TIP RULER LAB (MISCELLANEOUS) ×3 IMPLANT
MESH PARIETEX 20X15 (Mesh General) ×1 IMPLANT
NDL BIOPSY 14X6 SOFT TISS (NEEDLE) IMPLANT
NDL INSUFFLATION 14GA 150MM (NEEDLE) IMPLANT
NDL SPNL 22GX3.5 QUINCKE BK (NEEDLE) ×2 IMPLANT
NEEDLE BIOPSY 14X6 SOFT TISS (NEEDLE) ×3 IMPLANT
NEEDLE HYPO 22GX1.5 SAFETY (NEEDLE) ×3 IMPLANT
NEEDLE INSUFFLATION 14GA 150MM (NEEDLE) IMPLANT
NEEDLE SPNL 22GX3.5 QUINCKE BK (NEEDLE) ×3 IMPLANT
NS IRRIG 1000ML POUR BTL (IV SOLUTION) ×3 IMPLANT
PACK BASIC VI WITH GOWN DISP (CUSTOM PROCEDURE TRAY) ×3 IMPLANT
PENCIL BUTTON HOLSTER BLD 10FT (ELECTRODE) ×3 IMPLANT
SCALPEL HARMONIC ACE (MISCELLANEOUS) ×3 IMPLANT
SCISSORS LAP 5X35 DISP (ENDOMECHANICALS) ×3 IMPLANT
SET IRRIG TUBING LAPAROSCOPIC (IRRIGATION / IRRIGATOR) IMPLANT
SOL PREP POV-IOD 16OZ 10% (MISCELLANEOUS) ×3 IMPLANT
SOLUTION ANTI FOG 6CC (MISCELLANEOUS) ×3 IMPLANT
SPONGE GAUZE 4X4 12PLY (GAUZE/BANDAGES/DRESSINGS) ×3 IMPLANT
SPONGE LAP 4X18 X RAY DECT (DISPOSABLE) ×6 IMPLANT
STAPLER VISISTAT 35W (STAPLE) IMPLANT
STRIP CLOSURE SKIN 1/4X3 (GAUZE/BANDAGES/DRESSINGS) ×3 IMPLANT
STRIP CLOSURE SKIN 1/4X4 (GAUZE/BANDAGES/DRESSINGS) ×3 IMPLANT
SUT NOVA 0 T19/GS 22DT (SUTURE) ×6 IMPLANT
SUT NOVA NAB DX-16 0-1 5-0 T12 (SUTURE) IMPLANT
SUT NOVA NAB GS-21 1 T12 (SUTURE) ×2 IMPLANT
SUT PROLENE 0 CT 2 (SUTURE) IMPLANT
SUT VIC AB 2-0 SH 27 (SUTURE) ×3
SUT VIC AB 2-0 SH 27X BRD (SUTURE) IMPLANT
SUT VIC AB 3-0 SH 18 (SUTURE) ×1 IMPLANT
SUT VIC AB 3-0 SH 27 (SUTURE) ×3
SUT VIC AB 3-0 SH 27XBRD (SUTURE) IMPLANT
SUT VIC AB 4-0 PS2 27 (SUTURE) IMPLANT
SUT VIC AB 5-0 P-3 18XBRD (SUTURE) IMPLANT
SUT VIC AB 5-0 P3 18 (SUTURE)
SUT VIC AB 5-0 PS2 18 (SUTURE) ×6 IMPLANT
SYR BULB IRRIGATION 50ML (SYRINGE) IMPLANT
SYR CONTROL 10ML LL (SYRINGE) ×3 IMPLANT
TACKER 5MM HERNIA 3.5CML NAB (ENDOMECHANICALS) ×4 IMPLANT
TOWEL OR 17X26 10 PK STRL BLUE (TOWEL DISPOSABLE) ×3 IMPLANT
TRAY FOLEY CATH 14FRSI W/METER (CATHETERS) ×3 IMPLANT
TRAY LAP CHOLE (CUSTOM PROCEDURE TRAY) ×3 IMPLANT
TROCAR BLADELESS OPT 5 100 (ENDOMECHANICALS) ×1 IMPLANT
TROCAR BLADELESS OPT 5 75 (ENDOMECHANICALS) ×7 IMPLANT
TROCAR SLEEVE XCEL 5X75 (ENDOMECHANICALS) ×1 IMPLANT
TROCAR XCEL NON-BLD 11X100MML (ENDOMECHANICALS) ×3 IMPLANT
TUBING INSUFFLATION 10FT LAP (TUBING) ×3 IMPLANT
WATER STERILE IRR 1500ML POUR (IV SOLUTION) ×3 IMPLANT
YANKAUER SUCT BULB TIP 10FT TU (MISCELLANEOUS) ×3 IMPLANT

## 2012-06-30 NOTE — Op Note (Signed)
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  ASSISTANT:   None  ANESTHESIA:   general  Anesthesiologist: Myrtie Soman, MD CRNA: Sherry Ruffing, CRNA; Anne Fu, CRNA  General  EBL:  Minimal  ml  BLOOD ADMINISTERED: none  DRAINS: none   LOCAL MEDICATIONS USED:   20 cc 1/4% marcaine  SPECIMEN:   Umbilical sac and liver biopsy  COUNTS CORRECT:  YES  INDICATIONS FOR PROCEDURE:  Holly Mueller is a 47 y.o. (DOB: 1965-08-18) white  female whose primary care physician is Boykin Nearing, MD and comes for repair of abdominal wall hernia and removal of umbilicus   The indications and risks of the surgery were explained to the patient.  The risks include, but are not limited to, infection, bleeding, and nerve injury.  OPERATIVE NOTE: The patient was taken to room [facility] at [facility].  She underwent a general anesthesia.  She was given 2 grams of Ancef at the beginning of the operation.   A time out was held and the surgical checklist run.   The abdomen was prepped with Cholroprep and sterilely draped.  I covered the abdomen with a Ioban drape.   I accessed the LUQ with a 5 mm trocar.  An additional 10 mm trocar was placed in the left mid abdomen.  And a 5 mm trocar was placed in the RUQ.   I did an abdominal exploration.  Her liver was nodular, consistent with moderate cirrhotic changes.  Photos were taken.  I did a tru cut biopsy of the right lobe of her liver.  The bleeding was  controlled with cautery.  The stomach and bowel that I could see was okay.  She did have omentum caught up in the hernias on the anterior abdominal wall.   She had a 5 x 6 cm defect at the umbilicus.  There was some omentum incarcerated in the defect.  She had a second ventral hernia defect about 5 cm below the umbilicus that also had omentum incarcerated in the hernia.  This defect was about 2 x 3 cm.   I closed the umbilical defect with 1 Novafil using an endoclose.  I did not primarily close the inferior ventral hernia because it was so small.   Then I placed a 15 x 20 cm Parietex mesh in the abdomen.  It had 8 holding sutures of 0 Novafil.  These were tied down to secure the mesh to the anterior abdominal wall.  I then used 38 Securestrap tacks to tack the mesh to the anterior abdominal wall.   The abdomen was deflated.  The mesh was inspected and there were no defects around the edge.   Because of her obesity, the depth of her umbilicus and the bleeding ulcer in the umbilicus, I removed her umbilicus.  I also removed most of the sac.  I closed the peritoneum with 2-0 Vicryl.  I closed the subcutaneous tissue with 3-0 vicryl and the the skin with  5-0 Vicryl.  I then re-examined the abdominal cavity with the laparoscope.  The mesh looked good and there was not bleeding.   The trocars were then removed.  There was no bleeding at the trocar sites.  The incisions were closed with 5-0 Monocryl and painted with Dermabond. The puncture sites were closed with Steristrips.   The sponge and needle count were correct.  The patient was transferred to the recovery room in good condition.  She will be kept over night.    Type of repair - mesh  (choices - primary suture, mesh, or component)  Name of mesh - Parietex   Size of mesh - Length 20 cm, Width 15 cm  Mesh overlap - 4 cm  Placement of mesh - beneath fascia and into peritoneal cavity  (choices - beneath fascia and into peritoneal cavity,  beneath fascia but external to peritoneal cavity, between the muscle and fascia, above or external to fascia)  Alphonsa Overall, MD, [facility] Surgery Pager: [phone] Office phone:  [phone]

## 2012-06-30 NOTE — Transfer of Care (Signed)
Immediate Anesthesia Transfer of Care Note  Patient: Holly Mueller  Procedure(s) Performed: Procedure(s) (LRB): LAPAROSCOPIC VENTRAL HERNIA (N/A) remove umbilicus (N/A) LIVER BIOPSY  Patient Location: PACU  Anesthesia Type: General  Level of Consciousness: sedated, patient cooperative and responds to stimulaton  Airway & Oxygen Therapy: Patient Spontanous Breathing and Patient connected to face mask oxgen  Post-op Assessment: Report given to PACU RN and Post -op Vital signs reviewed and stable  Post vital signs: Reviewed and stable  Complications: No apparent anesthesia complications

## 2012-06-30 NOTE — Anesthesia Postprocedure Evaluation (Signed)
  Anesthesia Post-op Note  Patient: Holly Mueller  Procedure(s) Performed: Procedure(s) (LRB): LAPAROSCOPIC VENTRAL HERNIA (N/A) remove umbilicus (N/A) LIVER BIOPSY  Patient Location: PACU  Anesthesia Type: General  Level of Consciousness: awake and alert   Airway and Oxygen Therapy: Patient Spontanous Breathing  Post-op Pain: mild  Post-op Assessment: Post-op Vital signs reviewed, Patient's Cardiovascular Status Stable, Respiratory Function Stable, Patent Airway and No signs of Nausea or vomiting  Last Vitals:  Filed Vitals:   06/30/12 1130  BP: 125/89  Pulse: 62  Temp: 36.4 C  Resp: 14    Post-op Vital Signs: stable   Complications: No apparent anesthesia complications

## 2012-06-30 NOTE — Anesthesia Preprocedure Evaluation (Addendum)
Anesthesia Evaluation  Patient identified by MRN, date of birth, ID band Patient awake    Reviewed: Allergy & Precautions, H&P , NPO status , Patient's Chart, lab work & pertinent test results  Airway Mallampati: II TM Distance: >3 FB Neck ROM: Full    Dental  (+) Loose, Dental Advisory Given, Missing, Poor Dentition and Chipped   Pulmonary asthma ,  breath sounds clear to auscultation  Pulmonary exam normal       Cardiovascular negative cardio ROS  Rhythm:Regular Rate:Normal     Neuro/Psych negative neurological ROS  negative psych ROS   GI/Hepatic negative GI ROS, Neg liver ROS,   Endo/Other  Hypothyroidism Morbid obesity  Renal/GU negative Renal ROS  negative genitourinary   Musculoskeletal negative musculoskeletal ROS (+)   Abdominal   Peds negative pediatric ROS (+)  Hematology negative hematology ROS (+)   Anesthesia Other Findings Very poor dentition  Reproductive/Obstetrics negative OB ROS                          Anesthesia Physical Anesthesia Plan  ASA: III  Anesthesia Plan: General   Post-op Pain Management:    Induction: Intravenous  Airway Management Planned: Oral ETT  Additional Equipment:   Intra-op Plan:   Post-operative Plan: Extubation in OR  Informed Consent: I have reviewed the patients History and Physical, chart, labs and discussed the procedure including the risks, benefits and alternatives for the proposed anesthesia with the patient or authorized representative who has indicated his/her understanding and acceptance.   Dental advisory given  Plan Discussed with: CRNA and Surgeon  Anesthesia Plan Comments:         Anesthesia Quick Evaluation

## 2012-06-30 NOTE — H&P (Signed)
Re: Holly Mueller  DOB: [birth year]  MRN: [id number]   ASSESSMENT AND PLAN:  1. Umbilical hernia   I discussed the indications and complications of hernia surgery with the patient. I discussed both the laparoscopic and open approach to hernia repair.. The potential risks of hernia surgery include, but are not limited to, bleeding, infection, open surgery, nerve injury, and recurrence of the hernia. I have provided the patient literature about hernia surgery at the last visit.  Ulcer at the bottom of her umbilicus.   CT scan 04/12/2012 - large, fat containing hernia - 7 x 6 x 7.3 cm   I think she should have the hernia repaired and have her umbilicus removed. She wants to talk to work. She will be out of work x 2 weeks with light duty or 4 weeks if she has lifting > 15 pounds.   She has a friend with her, Oley Balm, today.   2. History of seizures   Last seizure was 1972.   She is on no meds  3. History of migraine headaches x 4 years.   Takes Imitrex  4. History of depression  5. Asthma   Does not smoke  6. Very poor dentition  7. Morbid obesity - weight - 246, BMI - 39.7   I talked about how weight will impact on her long term course. It is important she get her weight below 200 pounds long term. I think she understands the reasons, but whether she can do it is another question.  8. GERD x 4 years.  9. Hypothyroid - on replacement x 2 years.  [Patient has platelet count 127,000. Reason unclear. DN 06/27/2012]   Chief Complaint   Patient presents with   .  Follow-up     recheck hernia   REFERRING PHYSICIAN: Boykin Nearing, MD   HISTORY OF PRESENT ILLNESS:  Holly Mueller is a 47 y.o. (DOB: August 18, 1965) white female whose primary care physician is Boykin Nearing, MD and comes to me today for bleeding from umbilicus and possible umbilical hernia.  She has had some peri-umbilical pain for about 3 weeks. About 1 week about ago, she noticed some drainage from her umbilicus. Then this past  Tuesday, 03/29/2012, she went to the [facility] for bleeding from her umbilicus.   Past Medical History   Diagnosis  Date   .  Asthma  2004   .  GERD (gastroesophageal reflux disease)  2008   .  Epilepsy  in 1972     No seizures since 1972. Previously treated with phenobarbital.   .  Hypothyroidism  2008   .  Seasonal allergies  2003   .  Arthritis     Past Surgical History   Procedure  Date   .  Novasure ablation    .  C section      x2   .  R shoulder scope  2011   .  Cesarean section      x2    Current Outpatient Prescriptions   Medication  Sig  Dispense  Refill   .  albuterol (PROVENTIL HFA;VENTOLIN HFA) 108 (90 BASE) MCG/ACT inhaler  Inhale 2 puffs into the lungs every 6 (six) hours as needed.     Marland Kitchen  amitriptyline (ELAVIL) 10 MG tablet  Take 20 mg by mouth at bedtime.     .  budesonide-formoterol (SYMBICORT) 160-4.5 MCG/ACT inhaler  Inhale 2 puffs into the lungs 2 (two) times daily.  1 Inhaler  6   .  diclofenac (VOLTAREN)  25 MG EC tablet  Take 25 mg by mouth 2 (two) times daily. Pt unsure of actual dosage     .  esomeprazole (NEXIUM) 20 MG capsule  Take 1 capsule (20 mg total) by mouth daily before breakfast.  30 capsule  11   .  fluticasone (FLONASE) 50 MCG/ACT nasal spray  Place 2 sprays into the nose daily.  16 g  6   .  HYDROcodone-acetaminophen (VICODIN) 5-500 MG per tablet  Take 1-2 tablets by mouth every 6 (six) hours as needed for pain.  10 tablet  0   .  levothyroxine (SYNTHROID, LEVOTHROID) 300 MCG tablet  Take 1 tablet (300 mcg total) by mouth daily.  90 tablet  1   .  SUMAtriptan (IMITREX) 50 MG tablet  Take 1 tablet (50 mg total) by mouth every 2 (two) hours as needed for migraine.  10 tablet  0    Allergies   Allergen  Reactions   .  Aspirin  Nausea Only    REVIEW OF SYSTEMS:  Skin: No history of rash. No history of abnormal moles.  Infection: Very poor dental care. She has inflammation (possible abscess) along right jaw.  Neurologic: History of seizures, but her  last one was in 1972. History of migraine headaches.  Cardiac: No history of hypertension. No history of heart disease. No history of prior cardiac catheterization. No history of seeing a cardiologist.  Pulmonary: Does not smoke cigarettes. No asthma or bronchitis. No OSA/CPAP.  Endocrine: No diabetes. Hypothyroid, corrected.  Gastrointestinal: No history of stomach disease. No history of liver disease. No history of gall bladder disease. No history of pancreas disease. No history of colon disease.  Urologic: No history of kidney stones. No history of bladder infections.  Musculoskeletal: History of right shoulder surgery.  Hematologic: No bleeding disorder. No history of anemia. Not anticoagulated.  Psycho-social: The patient is oriented. History of depression.   SOCIAL and FAMILY HISTORY:  Unmarried.  Has 2 children - 12 and 14.  Works in [employer] at [place]:  BP 110/78  Pulse 72  Temp 97.5 F (36.4 C)  Resp 18  Ht _0  (1.676 m)  Wt 242 lb (109.77 kg)  BMI 39.06 kg/m2  General: Obese WF who is alert and generally healthy appearing.  HEENT: Normal. Pupils equal. Very poor dentition.  Neck: Supple. No mass. No thyroid mass.  Lymph Nodes: No supraclavicular or cervical nodes.  Lungs: Clear to auscultation and symmetric breath sounds.  Heart: RRR. No murmur or rub.  Abdomen: Soft. No mass. Normal bowel sounds. Pfannenstiel incision. Has deep umbilicus with an ulcer at the bottom of the umbilicus. The umbilicus looks worse this time than when I last saw. She is more apple than pear and has a significant pannus. I discussed how this will impact surgery and complications.  Extremities: Good strength and ROM in upper and lower extremities.  Neurologic: Grossly intact to motor and sensory function.  Psychiatric: Has normal mood and affect. Behavior is normal.   DATA REVIEWED:  CT scan. Results given to the patient.   Alphonsa Overall, MD, [facility], [address] [place], [place]  Phone: [phone] FAX: [phone]

## 2012-07-01 ENCOUNTER — Encounter (HOSPITAL_COMMUNITY): Payer: Self-pay | Admitting: Surgery

## 2012-07-01 NOTE — Progress Notes (Signed)
General Surgery Note  LOS: 1 day  POD -   1 Day Post-Op  Assessment/Plan: 1.  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  Cirrhotic changes in liver, biopsy pending  Explains elevated LFT's and thrombocytopenia.  3.  Morbid obesity 4. History of seizures   Last seizure was 24.   She is on no meds  5. History of migraine headaches x 4 years.   Takes Imitrex  6. History of depression  7. Asthma   Does not smoke  8. Very poor dentition  9. GERD x 4 years.  50. Hypothyroid - on replacement x 2 years. 11.  DVT prophylaxis - SQ heparin  Subjective:  Very sore.  Moving very slowly.  Nauseated yesterday, but better today. Objective:   Filed Vitals:   07/01/12 0532  BP: 95/63  Pulse: 62  Temp: 98.4 F (36.9 C)  Resp: 18     Intake/Output from previous day:  02/20 0701 - 02/21 0700 In: 3076.2 [P.O.:120; I.V.:2956.2] Out: 3135 [Urine:3100; Blood:25]  Intake/Output this shift:      Physical Exam:   General: Obese WF who is alert and oriented.    HEENT: Normal. Pupils equal. .   Lungs: Clear   Abdomen: Binder in place.     Wound: Covered with dressings   Lab Results:   No results found for this basename: WBC, HGB, HCT, PLT,  in the last 72 hours  BMET  No results found for this basename: NA, K, CL, CO2, GLUCOSE, BUN, CREATININE, CALCIUM,  in the last 72 hours  PT/INR  No results found for this basename: LABPROT, INR,  in the last 72 hours  ABG  No results found for this basename: PHART, PCO2, PO2, HCO3,  in the last 72 hours   Studies/Results:  No results found.   Anti-infectives:   Anti-infectives   Start     Dose/Rate Route Frequency Ordered Stop   06/30/12 0647  ceFAZolin (ANCEF) IVPB 2 g/50 mL premix     2 g 100 mL/hr  over 30 Minutes Intravenous 30 min pre-op 06/30/12 0647 06/30/12 0905      Alphonsa Overall, MD, FACS Pager: [phone],   [facility] Office: [phone] 07/01/2012

## 2012-07-01 NOTE — Care Management Note (Signed)
    Page 1 of 1   07/01/2012     11:10:39 AM   CARE MANAGEMENT NOTE 07/01/2012  Patient:  Holly Mueller, Holly Mueller   Account Number:  [id number]  Date Initiated:  07/01/2012  Documentation initiated by:  Sunday Spillers  Subjective/Objective Assessment:   47 yo female admitted s/p lap hernia repair. PTA lived at home with family.     Action/Plan:   Home when stable   Anticipated DC Date:  07/02/2012   Anticipated DC Plan:  [place]  CM consult      Choice offered to / List presented to:             Status of service:  Completed, signed off Medicare Important Message given?   (If response is "NO", the following Medicare IM given date fields will be blank) Date Medicare IM given:   Date Additional Medicare IM given:    Discharge Disposition:  HOME/SELF CARE  Per UR Regulation:  Reviewed for med. necessity/level of care/duration of stay  If discussed at [place] of Stay Meetings, dates discussed:    Comments:

## 2012-07-02 MED ORDER — HYDROCODONE-IBUPROFEN 5-200 MG PO TABS: 1.0000 | ORAL_TABLET | Freq: Four times a day (QID) | ORAL | Status: DC | PRN

## 2012-07-02 NOTE — Discharge Summary (Signed)
Physician Discharge Summary  Patient ID:  Holly Mueller  MRN: [id number]  DOB/AGE: Mar 27, 1966 47 y.o.  Admit date: 06/30/2012 Discharge date: 07/02/2012  Discharge Diagnoses:  1. 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 Cirrhotic changes in liver, biopsy results given to patient.  Explains elevated LFT's and thrombocytopenia.  3. Morbid obesity  4. History of seizures   Last seizure was 69.   She is on no meds  5. History of migraine headaches x 4 years.   Takes Imitrex  6. History of depression  7. Asthma   Does not smoke  8. Very poor dentition  9. GERD x 4 years.  66. Hypothyroid - on replacement x 2 years.  Operation: Procedure(s): LAPAROSCOPIC VENTRAL HERNIA, remove umbilicus, LIVER BIOPSY - D. [place] on 06/30/2012  Discharged Condition: good  Hospital Course: Holly Mueller is an 47 y.o. female whose primary care physician is Boykin Nearing, MD and who was admitted 06/30/2012 with a chief complaint of umbilical/ventral hernia.  She was brought to the operating room on 06/30/2012 and underwent  LAPAROSCOPIC VENTRAL HERNIA, remove umbilicus, LIVER BIOPSY .   Post op she had a lot of pain the first day and was not ready for discharge.  But today, though still sore, is doing better.  She is ready for discharge.  I gave her a copy of her liver biopsy and talked about follow up of this.  Consults: None  Significant Diagnostic Studies: Results for orders placed during the hospital encounter of 06/27/12  SURGICAL PCR SCREEN      Result Value Range   MRSA, PCR NEGATIVE  NEGATIVE   Staphylococcus aureus NEGATIVE  NEGATIVE  CBC      Result Value Range   WBC 4.4  4.0 - 10.5 K/uL   RBC 4.26  3.87 - 5.11 MIL/uL   Hemoglobin 12.7  12.0 - 15.0 g/dL   HCT 36.4  36.0 - 46.0 %   MCV 85.4  78.0 - 100.0 fL   MCH 29.8  26.0 - 34.0 pg   MCHC 34.9  30.0 -  36.0 g/dL   RDW 13.3  11.5 - 15.5 %   Platelets 127 (*) 150 - 400 K/uL    No results found.  Discharge Exam:  Filed Vitals:   07/02/12 0626  BP: 152/94  Pulse: 68  Temp: 97.9 F (36.6 C)  Resp: 20    General: Obese WN WF who is alert and generally healthy appearing.  Lungs: Clear to auscultation.  IS - 1000 cc. Heart:  RRR. No murmur or rub. Abdomen: Soft.  Normal bowel sounds.  Incisions with dressings.  Discharge Medications:     Medication List    TAKE these medications       albuterol 108 (90 BASE) MCG/ACT inhaler  Commonly known as:  PROVENTIL HFA;VENTOLIN HFA  Inhale 2 puffs into the lungs every 6 (six) hours as needed. Shortness of breath     amitriptyline 10 MG tablet  Commonly known as:  ELAVIL  Take 20 mg by mouth at bedtime.     budesonide-formoterol 160-4.5 MCG/ACT inhaler  Commonly known as:  SYMBICORT  Inhale 2 puffs into the lungs 2 (two) times daily.     esomeprazole 20 MG capsule  Commonly known as:  NEXIUM  Take 1 capsule (20 mg total) by mouth daily before breakfast.  fluticasone 50 MCG/ACT nasal spray  Commonly known as:  FLONASE  Place 2 sprays into the nose daily.     hydrocodone-ibuprofen 5-200 MG per tablet  Commonly known as:  VICOPROFEN  Take 1-2 tablets by mouth every 6 (six) hours as needed for pain.     levothyroxine 300 MCG tablet  Commonly known as:  SYNTHROID, LEVOTHROID  Take 1 tablet (300 mcg total) by mouth daily.     SUMAtriptan 50 MG tablet  Commonly known as:  IMITREX  Take 1 tablet (50 mg total) by mouth once a week. May take an additional  50 mg tablet in 2 hrs if migraine persist        Disposition: 01-Home or Self Care      Discharge Orders   Future Orders Complete By Expires     Diet - low sodium heart healthy  As directed     Increase activity slowly  As directed       Return to work on:  2 to 3 weeks.  Activity:  Driving - NA   Lifting - No lifting > 15 pounds x 4 weeks  Wound Care:   May  shower today.  Wear abdominal binder x 1 month.  Diet:  Eat light for 2 or 3 days (until feeling better)  Follow up appointment:  Call Dr. Pollie Friar office [facility]) at [phone] for an appointment in 10-14 days  Medications and dosages:  Resume your home medications.  You have a prescription for:  Vicoprofen  Signed: Alphonsa Overall, M.D., [facility]  07/02/2012, 7:27 AM

## 2012-07-02 NOTE — Progress Notes (Signed)
Pt for discharge today to her friend's house as claimed.IV d/c'd. Dressings to abdomen CDI-. D/C instructions & Rx given with verbalized understanding. Friends will assist with d/c.

## 2012-07-05 ENCOUNTER — Telehealth (INDEPENDENT_AMBULATORY_CARE_PROVIDER_SITE_OTHER): Payer: Self-pay

## 2012-07-05 NOTE — Telephone Encounter (Signed)
V/M Post/op appt 07/14/12_0 / Call to confirm

## 2012-07-12 ENCOUNTER — Ambulatory Visit: Payer: Self-pay | Admitting: Family Medicine

## 2012-07-14 ENCOUNTER — Ambulatory Visit (INDEPENDENT_AMBULATORY_CARE_PROVIDER_SITE_OTHER): Payer: Commercial Managed Care - PPO | Admitting: Surgery

## 2012-07-14 VITALS — BP 132/84 | HR 64 | Temp 98.0°F | Resp 18 | Ht 66.0 in | Wt 240.0 lb

## 2012-07-14 DIAGNOSIS — K429 Umbilical hernia without obstruction or gangrene: Secondary | ICD-10-CM

## 2012-07-14 NOTE — Progress Notes (Signed)
Re:   Holly Mueller DOB:   06-May-1973 MRN:   [id number]  ASSESSMENT AND PLAN: 1.  Umbilical/ventral hernia  Repaired 06/30/2012 by D. Newman  She is still sore, but getting better.  I wrote her a note to return to work 07/28/2012.   1b.  Umbilicus removed at time of ventral hernia repair.  2.  History of seizures  Last seizure was 1972.  She is on no meds 3.  History of migraine headaches x 4 years.  Takes Imitrex 4.  History of depression 5.  Asthma  Does not smoke 6.  Very poor dentition 7.  Morbid obesity - weight - 246, BMI - 39.7   I talked about how weight will impact on her long term course.  8.  GERD x 4 years. 9.  Hypothyroid - on replacement x 2 years. 10.  Cirrhosis of the liver  This needs further evaluation.  She is to be seen at the [facility] and missed her appt this past Tuesday, 07/12/2012, because of the snow.  I have given her a copy of the path report.  Chief Complaint  Patient presents with  . Routine Post Op    herina   REFERRING PHYSICIAN: Boykin Nearing, MD  HISTORY OF PRESENT ILLNESS: Holly Mueller is a 47 y.o. (DOB: 19-Sep-1965)  white female whose primary care physician is Boykin Nearing, MD and comes to me today for post op umbilical hernia repair.  Except for soreness, she has done well.  She is eating well.  And has no specific complaint.    Past Medical History  Diagnosis Date  . Asthma 2004  . GERD (gastroesophageal reflux disease) 2008  . Epilepsy in 1972    No seizures since 1972. Previously treated with phenobarbital.   . Hypothyroidism 2008  . Seasonal allergies 2003  . Arthritis   . Headache     hx of migraines         Current Outpatient Prescriptions  Medication Sig Dispense Refill  . albuterol (PROVENTIL HFA;VENTOLIN HFA) 108 (90 BASE) MCG/ACT inhaler Inhale 2 puffs into the lungs every 6 (six) hours as needed. Shortness of breath      . amitriptyline (ELAVIL) 10 MG tablet Take 20 mg by mouth at bedtime.       . budesonide-formoterol (SYMBICORT) 160-4.5 MCG/ACT inhaler Inhale 2 puffs into the lungs 2 (two) times daily.      Marland Kitchen esomeprazole (NEXIUM) 20 MG capsule Take 1 capsule (20 mg total) by mouth daily before breakfast.  30 capsule  11  . fluticasone (FLONASE) 50 MCG/ACT nasal spray Place 2 sprays into the nose daily.  16 g  6  . hydrocodone-ibuprofen (VICOPROFEN) 5-200 MG per tablet Take 1-2 tablets by mouth every 6 (six) hours as needed for pain.  30 tablet  1  . levothyroxine (SYNTHROID, LEVOTHROID) 300 MCG tablet Take 1 tablet (300 mcg total) by mouth daily.  90 tablet  1  . SUMAtriptan (IMITREX) 50 MG tablet Take 1 tablet (50 mg total) by mouth once a week. May take an additional  50 mg tablet in 2 hrs if migraine persist  10 tablet  0   No current facility-administered medications for this visit.      Allergies  Allergen Reactions  . Aspirin Nausea Only    REVIEW OF SYSTEMS: Skin:  No history of rash.  No history of abnormal moles. Infection:  Very poor dental care.  She has inflammation (possible abscess) along right jaw. Neurologic:  History  of seizures, but her last one was in 1972.  History of migraine headaches. Cardiac:  No history of hypertension. No history of heart disease.  No history of prior cardiac catheterization.  No history of seeing a cardiologist. Pulmonary:  Does not smoke cigarettes.  No asthma or bronchitis.  No OSA/CPAP.  Endocrine:  No diabetes. Hypothyroid, corrected. Gastrointestinal:  No history of stomach disease.  No history of liver disease.  No history of gall bladder disease.  No history of pancreas disease.  No history of colon disease. Urologic:  No history of kidney stones.  No history of bladder infections. Musculoskeletal:  History of right shoulder surgery. Hematologic:  No bleeding disorder.  No history of anemia.  Not anticoagulated. Psycho-social:  The patient is oriented.   History of depression.  SOCIAL and FAMILY HISTORY: Unmarried. Has 2  children - 12 and 14. Works in [employer] at [place]: BP 132/84  Pulse 64  Temp(Src) 98 F (36.7 C)  Resp 18  Ht _0  (1.676 m)  Wt 240 lb (108.863 kg)  BMI 38.76 kg/m2  General: Obese WF who is alert and generally healthy appearing.  HEENT: Normal. Pupils equal.  Very poor dentition. Lungs: Clear to auscultation and symmetric breath sounds. Heart:  RRR. No murmur or rub. Abdomen: Soft.  Wounds look\ good.  DATA REVIEWED: Reviewed liver biopsy report with patient.  Alphonsa Overall, MD,  [facility], [address]  [place], [place]    [place] Phone:  [phone] FAX:  [phone]

## 2012-07-19 ENCOUNTER — Ambulatory Visit: Payer: Self-pay | Admitting: Family Medicine

## 2012-07-22 ENCOUNTER — Telehealth (INDEPENDENT_AMBULATORY_CARE_PROVIDER_SITE_OTHER): Payer: Self-pay

## 2012-07-22 ENCOUNTER — Encounter (INDEPENDENT_AMBULATORY_CARE_PROVIDER_SITE_OTHER): Payer: Self-pay

## 2012-07-22 NOTE — Telephone Encounter (Signed)
Patient to pick up a return to work release for July 28, 2012 without any restrictions .Per Dr. Lucia Gaskins

## 2012-07-27 ENCOUNTER — Encounter: Payer: Self-pay | Admitting: Family Medicine

## 2012-07-27 ENCOUNTER — Ambulatory Visit (INDEPENDENT_AMBULATORY_CARE_PROVIDER_SITE_OTHER): Payer: 59 | Admitting: Family Medicine

## 2012-07-27 VITALS — BP 123/81 | HR 77 | Temp 98.6°F | Ht 66.0 in | Wt 243.0 lb

## 2012-07-27 DIAGNOSIS — K429 Umbilical hernia without obstruction or gangrene: Secondary | ICD-10-CM

## 2012-07-27 DIAGNOSIS — K7460 Unspecified cirrhosis of liver: Secondary | ICD-10-CM

## 2012-07-27 MED ORDER — DOCUSATE SODIUM 100 MG PO CAPS: 100.0000 mg | ORAL_CAPSULE | Freq: Two times a day (BID) | ORAL | Status: DC | PRN

## 2012-07-27 MED ORDER — IBUPROFEN 600 MG PO TABS: 600.0000 mg | ORAL_TABLET | Freq: Three times a day (TID) | ORAL | Status: DC | PRN

## 2012-07-27 NOTE — Patient Instructions (Signed)
Nylene,  Thank you for coming in today.  I sent in colace, stool softener take twice daily with lots of water.  I sent in 600 mg ibuprofen, take once you're out of aleve for moderate pains.  Schedule lab visit for cholesterol check.   Please f/u in one month.  Dr. Adrian Blackwater

## 2012-07-28 ENCOUNTER — Other Ambulatory Visit: Payer: Self-pay

## 2012-07-28 NOTE — Progress Notes (Signed)
Subjective:     Patient ID: Holly Mueller, female   DOB: 1965/09/04, 47 y.o.   MRN: [id number]  HPI 47 yo F presents for f/u visit to discuss the following:  # ventral hernia repair: patient is 4 weeks post op. She is still having sharp lower abdominal pains, 8/10. She denies N/V/diarrhea, constipation and fever. She will return to work next Monday. She is taking hydrocodone-ibuprofen for pain. She request return to work with restrictions.   # cirrhosis: diagnosed on recent liver biopsy during hernia surgery. Patient does not drink alcohol.   Review of Systems As per HPI     Objective:   Physical Exam BP 123/81  Pulse 77  Temp(Src) 98.6 F (37 C) (Oral)  Ht 5' 6" (1.676 m)  Wt 243 lb (110.224 kg)  BMI 39.24 kg/m2 General appearance: alert, cooperative and no distress Abdomen: large abdomen with pannus. healed surgical incisions. mild TTP b/l LQ w/o rebound or guarding. No masses.  Reviewed liver biopsy pathology report    Assessment and Plan:

## 2012-07-28 NOTE — Assessment & Plan Note (Addendum)
A: S/p surgical repair. Patient healing.  P: vicodin with tylenol prn severe pain. I sent in colace, stool softener take twice daily with lots of water.  I sent in 600 mg ibuprofen, take once you're out of aleve for moderate pains. Restrictions of no heavy lifting and patient should be able to sit when needed for 4 weeks.

## 2012-07-28 NOTE — Assessment & Plan Note (Addendum)
A: suspect fatty liver disease P: Fasting lipid panel to evaluate need for statin Avoid hepatotoxic medications

## 2012-07-31 ENCOUNTER — Emergency Department (HOSPITAL_COMMUNITY)
Admission: EM | Admit: 2012-07-31 | Discharge: 2012-07-31 | Disposition: A | Discharge status: Home / Self Care | Payer: 59 | Attending: Emergency Medicine | Admitting: Emergency Medicine

## 2012-07-31 ENCOUNTER — Encounter (HOSPITAL_COMMUNITY): Payer: Self-pay | Admitting: *Deleted

## 2012-07-31 DIAGNOSIS — G43909 Migraine, unspecified, not intractable, without status migrainosus: Secondary | ICD-10-CM | POA: Insufficient documentation

## 2012-07-31 DIAGNOSIS — K219 Gastro-esophageal reflux disease without esophagitis: Secondary | ICD-10-CM | POA: Insufficient documentation

## 2012-07-31 DIAGNOSIS — J45909 Unspecified asthma, uncomplicated: Secondary | ICD-10-CM | POA: Insufficient documentation

## 2012-07-31 DIAGNOSIS — E039 Hypothyroidism, unspecified: Secondary | ICD-10-CM | POA: Insufficient documentation

## 2012-07-31 DIAGNOSIS — G8918 Other acute postprocedural pain: Secondary | ICD-10-CM | POA: Insufficient documentation

## 2012-07-31 DIAGNOSIS — Z79899 Other long term (current) drug therapy: Secondary | ICD-10-CM | POA: Insufficient documentation

## 2012-07-31 DIAGNOSIS — Z8739 Personal history of other diseases of the musculoskeletal system and connective tissue: Secondary | ICD-10-CM | POA: Insufficient documentation

## 2012-07-31 LAB — COMPREHENSIVE METABOLIC PANEL
AST: 62 U/L — ABNORMAL HIGH (ref 0–37)
Albumin: 3.4 g/dL — ABNORMAL LOW (ref 3.5–5.2)
BUN: 14 mg/dL (ref 6–23)
Chloride: 101 mEq/L (ref 96–112)
Creatinine, Ser: 0.8 mg/dL (ref 0.50–1.10)
Potassium: 3.9 mEq/L (ref 3.5–5.1)
Total Bilirubin: 0.7 mg/dL (ref 0.3–1.2)
Total Protein: 8.7 g/dL — ABNORMAL HIGH (ref 6.0–8.3)

## 2012-07-31 LAB — CBC WITH DIFFERENTIAL/PLATELET
Basophils Absolute: 0.1 10*3/uL (ref 0.0–0.1)
Basophils Relative: 1 % (ref 0–1)
Eosinophils Absolute: 0.2 10*3/uL (ref 0.0–0.7)
MCH: 29.6 pg (ref 26.0–34.0)
MCHC: 35.8 g/dL (ref 30.0–36.0)
Monocytes Absolute: 0.4 10*3/uL (ref 0.1–1.0)
Monocytes Relative: 11 % (ref 3–12)
Neutro Abs: 2.1 10*3/uL (ref 1.7–7.7)
Neutrophils Relative %: 53 % (ref 43–77)
RDW: 13.1 % (ref 11.5–15.5)

## 2012-07-31 LAB — URINALYSIS, MICROSCOPIC ONLY
Glucose, UA: NEGATIVE mg/dL
Leukocytes, UA: NEGATIVE
pH: 6 (ref 5.0–8.0)

## 2012-07-31 NOTE — ED Provider Notes (Signed)
History     CSN: [id number]  Arrival date & time 07/31/12  1249   First MD Initiated Contact with Patient 07/31/12 1840      Chief Complaint  Patient presents with  . Abdominal Pain    (Consider location/radiation/quality/duration/timing/severity/associated sxs/prior treatment) HPI Complains of low abdominal pain daily since repair of ventral hernia with the umbilicus removed February 2014. Today pain became severe for approximately 3 hours from 10 AM until 1 PM. She treated herself with 2 Aleve tablets earlier today. Patient had been taking Vicodin for the pain prior to today however she started work yesterday and did not want to take more Vicodin as it causes drowsiness. Presently discomfort is minimal. Last bowel movement today was normal she's exhibited no fever no anorexia no urinary symptoms and no other associated symptoms. She is comfortable at present and discomfort is mild, localized in the low right lower quadrant and unchanged from the pain she experiences daily for approximately the past month pain is not made better or worse by anything Past Medical History  Diagnosis Date  . Asthma 2004  . GERD (gastroesophageal reflux disease) 2008  . Epilepsy in 1972    No seizures since 1972. Previously treated with phenobarbital.   . Hypothyroidism 2008  . Seasonal allergies 2003  . Arthritis   . Headache     hx of migraines     Past Surgical History  Procedure Laterality Date  . Novasure ablation    . C section      x2  . R shoulder scope  2011  . Cesarean section      x2  . Wisdom tooth extraction    . Ventral hernia repair N/A 06/30/2012    Procedure: LAPAROSCOPIC VENTRAL HERNIA;  Surgeon: Shann Medal, MD;  Location: [facility];  Service: General;  Laterality: N/A;  With Mesh  . Umbilical hernia repair N/A 06/30/2012    Procedure: remove umbilicus;  Surgeon: Shann Medal, MD;  Location: [facility];  Service: General;  Laterality: N/A;  . Liver biopsy  06/30/2012   Procedure: LIVER BIOPSY;  Surgeon: Shann Medal, MD;  Location: [facility];  Service: General;;    Family History  Problem Relation Age of Onset  . Hypertension Mother   . Diabetes Mother   . Lung cancer Father   . Stroke Father   . Diabetes Sister   . Hypertension Sister     History  Substance Use Topics  . Smoking status: Never Smoker   . Smokeless tobacco: Never Used  . Alcohol Use: 0.0 oz/week     Comment: special occasions.     OB History   Grav Para Term Preterm Abortions TAB SAB Ect Mult Living                  Review of Systems  Constitutional: Negative.   HENT: Negative.   Respiratory: Negative.   Cardiovascular: Negative.   Gastrointestinal: Positive for abdominal pain.  Genitourinary:       Amenorrheic for several years  Musculoskeletal: Negative.   Skin: Negative.   Neurological: Negative.   Psychiatric/Behavioral: Negative.   All other systems reviewed and are negative.    Allergies  Aspirin  Home Medications   Current Outpatient Rx  Name  Route  Sig  Dispense  Refill  . albuterol (PROVENTIL HFA;VENTOLIN HFA) 108 (90 BASE) MCG/ACT inhaler   Inhalation   Inhale 2 puffs into the lungs every 6 (six) hours as needed. Shortness of breath         .  budesonide-formoterol (SYMBICORT) 160-4.5 MCG/ACT inhaler   Inhalation   Inhale 2 puffs into the lungs 2 (two) times daily.         . cetirizine (ZYRTEC) 10 MG tablet   Oral   Take 10 mg by mouth daily.         Marland Kitchen esomeprazole (NEXIUM) 20 MG capsule   Oral   Take 20 mg by mouth daily before breakfast.         . fluticasone (FLONASE) 50 MCG/ACT nasal spray   Nasal   Place 2 sprays into the nose daily.   16 g   6   . hydrocodone-ibuprofen (VICOPROFEN) 5-200 MG per tablet   Oral   Take 1-2 tablets by mouth every 6 (six) hours as needed for pain.         Marland Kitchen levothyroxine (SYNTHROID, LEVOTHROID) 300 MCG tablet   Oral   Take 1 tablet (300 mcg total) by mouth daily.   90 tablet   1   .  naproxen sodium (ANAPROX) 220 MG tablet   Oral   Take 660 mg by mouth 2 (two) times daily with a meal.         . SUMAtriptan (IMITREX) 50 MG tablet   Oral   Take 1 tablet (50 mg total) by mouth once a week. May take an additional  50 mg tablet in 2 hrs if migraine persist   10 tablet   0   . amitriptyline (ELAVIL) 10 MG tablet   Oral   Take 20 mg by mouth at bedtime.           BP 111/87  Pulse 60  Temp(Src) 98.1 F (36.7 C) (Oral)  Resp 16  SpO2 99%  Physical Exam  Nursing note and vitals reviewed. Constitutional: She appears well-developed and well-nourished.  HENT:  Head: Normocephalic and atraumatic.  Poor dentition  Eyes: Conjunctivae are normal. Pupils are equal, round, and reactive to light.  Neck: Neck supple. No tracheal deviation present. No thyromegaly present.  Cardiovascular: Normal rate and regular rhythm.   No murmur heard. Pulmonary/Chest: Effort normal and breath sounds normal.  Abdominal: Soft. Bowel sounds are normal. She exhibits no distension and no mass. There is tenderness. There is no rebound and no guarding.  Morbidly obese tender right lower quadrant  Musculoskeletal: Normal range of motion. She exhibits no edema and no tenderness.  Neurological: She is alert. Coordination normal.  Gait normal  Skin: Skin is warm and dry. No rash noted.  Psychiatric: She has a normal mood and affect.    ED Course  Procedures (including critical care time)  Labs Reviewed  CBC WITH DIFFERENTIAL - Abnormal; Notable for the following:    WBC 3.9 (*)    Eosinophils Relative 6 (*)    All other components within normal limits  COMPREHENSIVE METABOLIC PANEL - Abnormal; Notable for the following:    Total Protein 8.7 (*)    Albumin 3.4 (*)    AST 62 (*)    ALT 57 (*)    Alkaline Phosphatase 156 (*)    GFR calc non Af Amer 86 (*)    All other components within normal limits  URINALYSIS, MICROSCOPIC ONLY - Abnormal; Notable for the following:    Color,  Urine AMBER (*)    APPearance CLOUDY (*)    Hgb urine dipstick MODERATE (*)    Bilirubin Urine SMALL (*)    Urobilinogen, UA 2.0 (*)    Squamous Epithelial / LPF MANY (*)  All other components within normal limits   No results found.   No diagnosis found. Results for orders placed during the hospital encounter of 07/31/12  CBC WITH DIFFERENTIAL      Result Value Range   WBC 3.9 (*) 4.0 - 10.5 K/uL   RBC 4.36  3.87 - 5.11 MIL/uL   Hemoglobin 12.9  12.0 - 15.0 g/dL   HCT 36.0  36.0 - 46.0 %   MCV 82.6  78.0 - 100.0 fL   MCH 29.6  26.0 - 34.0 pg   MCHC 35.8  30.0 - 36.0 g/dL   RDW 13.1  11.5 - 15.5 %   Platelets PLATELET CLUMPS NOTED ON SMEAR, UNABLE TO ESTIMATE  150 - 400 K/uL   Neutrophils Relative 53  43 - 77 %   Neutro Abs 2.1  1.7 - 7.7 K/uL   Lymphocytes Relative 29  12 - 46 %   Lymphs Abs 1.1  0.7 - 4.0 K/uL   Monocytes Relative 11  3 - 12 %   Monocytes Absolute 0.4  0.1 - 1.0 K/uL   Eosinophils Relative 6 (*) 0 - 5 %   Eosinophils Absolute 0.2  0.0 - 0.7 K/uL   Basophils Relative 1  0 - 1 %   Basophils Absolute 0.1  0.0 - 0.1 K/uL  COMPREHENSIVE METABOLIC PANEL      Result Value Range   Sodium 138  135 - 145 mEq/L   Potassium 3.9  3.5 - 5.1 mEq/L   Chloride 101  96 - 112 mEq/L   CO2 27  19 - 32 mEq/L   Glucose, Bld 70  70 - 99 mg/dL   BUN 14  6 - 23 mg/dL   Creatinine, Ser 0.80  0.50 - 1.10 mg/dL   Calcium 9.2  8.4 - 10.5 mg/dL   Total Protein 8.7 (*) 6.0 - 8.3 g/dL   Albumin 3.4 (*) 3.5 - 5.2 g/dL   AST 62 (*) 0 - 37 U/L   ALT 57 (*) 0 - 35 U/L   Alkaline Phosphatase 156 (*) 39 - 117 U/L   Total Bilirubin 0.7  0.3 - 1.2 mg/dL   GFR calc non Af Amer 86 (*) >90 mL/min   GFR calc Af Amer >90  >90 mL/min  URINALYSIS, MICROSCOPIC ONLY      Result Value Range   Color, Urine AMBER (*) YELLOW   APPearance CLOUDY (*) CLEAR   Specific Gravity, Urine 1.022  1.005 - 1.030   pH 6.0  5.0 - 8.0   Glucose, UA NEGATIVE  NEGATIVE mg/dL   Hgb urine dipstick MODERATE  (*) NEGATIVE   Bilirubin Urine SMALL (*) NEGATIVE   Ketones, ur NEGATIVE  NEGATIVE mg/dL   Protein, ur NEGATIVE  NEGATIVE mg/dL   Urobilinogen, UA 2.0 (*) 0.0 - 1.0 mg/dL   Nitrite NEGATIVE  NEGATIVE   Leukocytes, UA NEGATIVE  NEGATIVE   RBC / HPF 3-6  <3 RBC/hpf   Bacteria, UA RARE  RARE   Squamous Epithelial / LPF MANY (*) RARE   Urine-Other MUCOUS PRESENT     No results found.    MDM  Patient's pain is chronic she had a mild exacerbation today which was brief and self limiting Suggested follow up with Dr. Lucia Gaskins in office. There are no signs of hernia. Her appetite remains good she is not acutely ill-appearing Diagnosis postoperative pain        Orlie Dakin, MD 07/31/12 1906

## 2012-07-31 NOTE — ED Notes (Signed)
Pt denies n/v/d

## 2012-07-31 NOTE — ED Notes (Signed)
Pt reports having recent hernia repair, went back to work and now having more severe pain RLQ. No acute distress noted at triage.

## 2012-08-05 ENCOUNTER — Telehealth (INDEPENDENT_AMBULATORY_CARE_PROVIDER_SITE_OTHER): Payer: Self-pay | Admitting: *Deleted

## 2012-08-05 NOTE — Telephone Encounter (Signed)
Patient called to ask about setting up a PO appt.  Patient states she hasn't come in since 07/14/12 bc she can't afford her co-pay.  Patient reports she will not be able to pay her co-pay until after 08/18/12.

## 2012-08-23 ENCOUNTER — Encounter: Payer: Self-pay | Admitting: Family Medicine

## 2012-08-23 ENCOUNTER — Ambulatory Visit (INDEPENDENT_AMBULATORY_CARE_PROVIDER_SITE_OTHER): Payer: 59 | Admitting: Family Medicine

## 2012-08-23 VITALS — BP 124/82 | HR 75 | Ht 66.0 in | Wt 240.0 lb

## 2012-08-23 DIAGNOSIS — M25559 Pain in unspecified hip: Secondary | ICD-10-CM

## 2012-08-23 DIAGNOSIS — M67431 Ganglion, right wrist: Secondary | ICD-10-CM

## 2012-08-23 DIAGNOSIS — M6740 Ganglion, unspecified site: Secondary | ICD-10-CM

## 2012-08-23 DIAGNOSIS — M25551 Pain in right hip: Secondary | ICD-10-CM

## 2012-08-23 DIAGNOSIS — K7460 Unspecified cirrhosis of liver: Secondary | ICD-10-CM

## 2012-08-23 MED ORDER — MELOXICAM 15 MG PO TABS: 15.0000 mg | ORAL_TABLET | Freq: Every day | ORAL | Status: DC

## 2012-08-23 NOTE — Patient Instructions (Addendum)
Ms. Mcqueary,  Thank you for coming in today. Please start mobic once daily with food for hip pain. Go for x-rays.  Schedule appointment at sports medicine for eval and treatment of suspected trochanteric bursitis.   See me if R wrist pain returns and persist for drainage of ganglion cyst.   Dr. Adrian Blackwater

## 2012-08-24 DIAGNOSIS — M67439 Ganglion, unspecified wrist: Secondary | ICD-10-CM | POA: Insufficient documentation

## 2012-08-24 LAB — HEPATITIS C ANTIBODY, REFLEX: HCV Ab: NEGATIVE

## 2012-08-24 LAB — HEPATITIS B SURFACE ANTIBODY,QUALITATIVE: Hep B S Ab: NONREACTIVE

## 2012-08-24 NOTE — Assessment & Plan Note (Signed)
A: lateral hip pain concerning for trochanteric bursitis on the L.   P; b/l hip x-ray mobic Referral to Sports medicine for eval and treatment with possible corticosteroid injection of bursa.

## 2012-08-24 NOTE — Progress Notes (Signed)
Subjective:     Patient ID: Holly Mueller, female   DOB: Dec 07, 1965, 47 y.o.   MRN: [id number]  HPI 47 yo F presents for f/u visit to discuss the following:  1. B/l hip pain: x 20 years. Worsening over past 3 weeks. New new injury. Pain is lateral hips. Sometimes radiates medially and down to lateral knee. Worse with prolonged standing. Taking ibuprofen with partial relief of symptoms.   2. R wrist pain: chronic. Exacerbated over past two days. No pain today. Also with cyst on radial side for at least 6 months.   Review of Systems As per HPI     Objective:   Physical Exam BP 124/82  Pulse 75  Ht _0  (1.676 m)  Wt 240 lb (108.863 kg)  BMI 38.76 kg/m2 General appearance: alert, cooperative and no distress Extremities:  R wrist: 2+ radial pulse. Full ROM. Negative finkelstein. Non tender. Soft, mobile, non tender, radial aspect of wrist about 1x1 cm.   L Hip: ROM IR: 80 Deg, ER: 80 Deg, Flexion: 120 Deg, Extension: 100 Deg, Abduction: 45 Deg, Adduction: 45 Deg Strength IR: 5/5, ER: 5/5, Flexion: 5/5, Extension: 5/5, Abduction: 5/5, Adduction: 4+/5 Pelvic alignment unremarkable to inspection and palpation. Standing hip rotation and gait without trendelenburg / unsteadiness. Greater trochanter with mild tenderness to palpation. No tenderness over piriformis and greater trochanter. No SI joint tenderness and normal minimal SI movement.  R Hip: ROM IR: 80 Deg, ER: 80 Deg, Flexion: 120 Deg, Extension: 100 Deg, Abduction: 45 Deg, Adduction: 45 Deg Strength IR: 5/5, ER: 5/5, Flexion: 5/5, Extension: 5/5, Abduction: 5/5, Adduction: 5/5 Pelvic alignment unremarkable to inspection and palpation. Standing hip rotation and gait without trendelenburg / unsteadiness. Greater trochanter without tenderness to palpation. No tenderness over piriformis and greater trochanter. No SI joint tenderness and normal minimal SI movement.        Assessment and Plan:

## 2012-08-24 NOTE — Assessment & Plan Note (Signed)
A: pain free. P: advised patient to come back if pain returns and persist for cyst drainage. Advised that cyst may return and require surgical removal.

## 2012-08-31 ENCOUNTER — Emergency Department (INDEPENDENT_AMBULATORY_CARE_PROVIDER_SITE_OTHER)
Admission: EM | Admit: 2012-08-31 | Discharge: 2012-08-31 | Disposition: A | Discharge status: Home / Self Care | Payer: 59 | Source: Home / Self Care | Attending: Family Medicine | Admitting: Family Medicine

## 2012-08-31 ENCOUNTER — Encounter (HOSPITAL_COMMUNITY): Payer: Self-pay | Admitting: *Deleted

## 2012-08-31 DIAGNOSIS — M25539 Pain in unspecified wrist: Secondary | ICD-10-CM

## 2012-08-31 DIAGNOSIS — M25531 Pain in right wrist: Secondary | ICD-10-CM

## 2012-08-31 MED ORDER — TRAMADOL HCL 50 MG PO TABS: 50.0000 mg | ORAL_TABLET | Freq: Three times a day (TID) | ORAL | Status: AC | PRN

## 2012-08-31 NOTE — ED Provider Notes (Signed)
History     CSN: [id number]  Arrival date & time 08/31/12  1827   First MD Initiated Contact with Patient 08/31/12 1836      Chief Complaint  Patient presents with  . Wrist Pain    (Consider location/radiation/quality/duration/timing/severity/associated sxs/prior treatment) HPI Comments: 47 year old female here complaining of  right wrist pain. Patient reports she was standing on the front of her bed and leaning forward putting all the weight on her hyperextended hands and wrists and suddenly felt a sharp pain in the ventral area of her right wrist with radiation around the wrist but no distally or proximally. Denies pain over carpal areas or fingers. She has had a synovial cyst in her wrist for years. Denies direct trauma or recent falls. 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 Denies upper extremity weakness or objects falling from her hands. Patient is not diabetic.   Past Medical History  Diagnosis Date  . Asthma 2004  . GERD (gastroesophageal reflux disease) 2008  . Epilepsy in 1972    No seizures since 1972. Previously treated with phenobarbital.   . Hypothyroidism 2008  . Seasonal allergies 2003  . Arthritis   . Headache     hx of migraines     Past Surgical History  Procedure Laterality Date  . Novasure ablation    . C section      x2  . R shoulder scope  2011  . Cesarean section      x2  . Wisdom tooth extraction    . Ventral hernia repair N/A 06/30/2012    Procedure: LAPAROSCOPIC VENTRAL HERNIA;  Surgeon: Shann Medal, MD;  Location: [facility];  Service: General;  Laterality: N/A;  With Mesh  . Umbilical hernia repair N/A 06/30/2012    Procedure: remove umbilicus;  Surgeon: Shann Medal, MD;  Location: [facility];  Service: General;  Laterality: N/A;  . Liver biopsy  06/30/2012    Procedure: LIVER BIOPSY;  Surgeon: Shann Medal, MD;  Location: [facility];  Service: General;;    Family History  Problem Relation Age of Onset  . Hypertension Mother   . Diabetes Mother   . Lung cancer Father   . Stroke Father   . Diabetes Sister   . Hypertension Sister     History  Substance Use Topics  . Smoking status: Never Smoker   . Smokeless tobacco: Never Used  . Alcohol Use: 0.0 oz/week     Comment: special occasions.     OB History   Grav Para Term Preterm Abortions TAB SAB Ect Mult Living                  Review of Systems  Constitutional: Negative for fever and chills.  Endocrine: Negative for cold intolerance and heat intolerance.  Musculoskeletal:       As per history of present illness    Allergies  Aspirin  Home Medications   Current Outpatient Rx  Name  Route  Sig  Dispense  Refill  . albuterol (PROVENTIL HFA;VENTOLIN HFA) 108 (90 BASE) MCG/ACT inhaler   Inhalation   Inhale 2 puffs into the lungs every 6 (six) hours as needed. Shortness of breath         . amitriptyline (ELAVIL) 10 MG tablet   Oral   Take 20 mg by mouth at bedtime.         Marland Kitchen  budesonide-formoterol (SYMBICORT) 160-4.5 MCG/ACT inhaler   Inhalation   Inhale 2 puffs into the lungs 2 (two) times daily.         . cetirizine (ZYRTEC) 10 MG tablet   Oral   Take 10 mg by mouth daily.         Marland Kitchen esomeprazole (NEXIUM) 20 MG capsule   Oral   Take 20 mg by mouth daily before breakfast.         . levothyroxine (SYNTHROID, LEVOTHROID) 300 MCG tablet   Oral   Take 1 tablet (300 mcg total) by mouth daily.   90 tablet   1   . meloxicam (MOBIC) 15 MG tablet   Oral   Take 1 tablet (15 mg total) by mouth daily.   30 tablet   0   . SUMAtriptan (IMITREX) 50 MG tablet   Oral   Take 1 tablet (50 mg total) by mouth once a week. May take an additional  50 mg tablet in 2 hrs if migraine persist   10 tablet   0   . EXPIRED: fluticasone (FLONASE) 50 MCG/ACT nasal spray   Nasal   Place 2 sprays into the nose daily.    16 g   6   . traMADol (ULTRAM) 50 MG tablet   Oral   Take 1 tablet (50 mg total) by mouth every 8 (eight) hours as needed for pain.   15 tablet   0     BP 130/87  Pulse 61  Temp(Src) 98.4 F (36.9 C) (Oral)  Resp 16  SpO2 97%  Physical Exam  Nursing note and vitals reviewed. Constitutional: She is oriented to person, place, and time. She appears well-developed and well-nourished. No distress.  Cardiovascular: Normal heart sounds.   Pulmonary/Chest: Breath sounds normal.  Musculoskeletal:  Right wrist: No obvious deformity, swelling or erythema. Patient does have a small 1x1 cm cyst in the volar surface lateral area.  Patient able to make a fist, abduct and adduct digits with reported minimal discomfort including  thumb opposition to other digits. Discomfort with wrist flexion and worse with hyperextension. No focal tenderness over the dorsal carpal or metacarpal bones. No enderness to percussion over volar wrist surface. Intact 2 point discrimination in the dorsal and palmar aspect of the hand and fingers.  Negative Phalen's test at 30 seconds. Negative Finkelstain's test. Patent radial and ulnar pulses with brisk cap refill at the tip of the fingers. Patient reported pain with hand grip. Strength impressed normal despite discomfort.   Neurological: She is alert and oriented to person, place, and time.  Skin: She is not diaphoretic.    ED Course  Procedures (including critical care time)  Labs Reviewed - No data to display No results found.   1. Wrist pain, acute, right       MDM  Today's history and examination is suggestive of a wrist sprain. Although reported intermittent symptoms are possible related to carpal tunnel. Patient was placed on a wrist splint and I prescribed tramadol she is already taking meloxicam or other musculoskeletal problems. Supportive care including rehabilitation exercises discussed with patient and provided in writing. Patient has a  followup appointment at [facility].        Randa Spike, MD 08/31/12 2101

## 2012-08-31 NOTE — ED Notes (Signed)
Was standing up from the bed.  She put her hand flat on the bed and was pushing up and felt a sharp pain in R wrist.  Pain goes all the way around her wrist.  She has a knot on ventral surface of her wrist that has been there a long time.

## 2012-09-01 ENCOUNTER — Telehealth: Payer: Self-pay | Admitting: Family Medicine

## 2012-09-01 ENCOUNTER — Ambulatory Visit
Admission: RE | Admit: 2012-09-01 | Discharge: 2012-09-01 | Disposition: A | Discharge status: Home / Self Care | Payer: 59 | Source: Ambulatory Visit | Attending: Family Medicine | Admitting: Family Medicine

## 2012-09-01 DIAGNOSIS — M25552 Pain in left hip: Secondary | ICD-10-CM

## 2012-09-01 DIAGNOSIS — M25551 Pain in right hip: Secondary | ICD-10-CM

## 2012-09-01 NOTE — Telephone Encounter (Signed)
Called patient. Left VM. Normal hip x ray Continue with Sports medicine referral for eval of possible bursitis.

## 2012-09-07 ENCOUNTER — Encounter: Payer: Self-pay | Admitting: Family Medicine

## 2012-09-07 ENCOUNTER — Ambulatory Visit (INDEPENDENT_AMBULATORY_CARE_PROVIDER_SITE_OTHER): Payer: 59 | Admitting: Family Medicine

## 2012-09-07 VITALS — BP 127/82 | HR 76 | Temp 98.2°F | Ht 66.0 in | Wt 241.0 lb

## 2012-09-07 DIAGNOSIS — J45909 Unspecified asthma, uncomplicated: Secondary | ICD-10-CM

## 2012-09-07 DIAGNOSIS — J45901 Unspecified asthma with (acute) exacerbation: Secondary | ICD-10-CM

## 2012-09-07 MED ORDER — ALBUTEROL SULFATE (2.5 MG/3ML) 0.083% IN NEBU
2.5000 mg | INHALATION_SOLUTION | Freq: Once | RESPIRATORY_TRACT | Status: AC
  Administered 2012-09-07: 2.5 mg via RESPIRATORY_TRACT

## 2012-09-07 MED ORDER — ALBUTEROL SULFATE (2.5 MG/3ML) 0.083% IN NEBU: 2.5000 mg | INHALATION_SOLUTION | Freq: Four times a day (QID) | RESPIRATORY_TRACT | Status: DC | PRN

## 2012-09-07 MED ORDER — PREDNISONE 50 MG PO TABS: ORAL_TABLET | ORAL | Status: DC

## 2012-09-07 MED ORDER — MINOCYCLINE HCL 100 MG PO CAPS: 100.0000 mg | ORAL_CAPSULE | Freq: Two times a day (BID) | ORAL | Status: DC

## 2012-09-07 NOTE — Assessment & Plan Note (Addendum)
Acute exacerbation. She received albuterol treatment in clinic with marked improvement of symptoms. Treating this as an exacerbation: 5 days of prednisone +7 days of doxycycline. Followup with [place] in one week to ensure improvement or sooner if worsening. Patient desires nebulizer machine, sent in machine plus albuterol for her.

## 2012-09-07 NOTE — Progress Notes (Signed)
SUBJECTIVE:  Holly Mueller is a 47 y.o. female seen urgently with exacerbation of asthma for 4 days. Wheezing is described as moderate to severe. Associated symptoms:congestion, sore throat, nasal blockage, productive cough and cough described as harsh. Current asthma medications: Proventil MDI (or generic equivalent), Symbicort.. Never smoker.    OBJECTIVE:  The patient appears alert, well appearing, and in no distress. ENT: ENT exam normal, no neck nodes or sinus tenderness CHEST: Wheezing noted bilateral lower lung bases. Lungs otherwise clear. No crackles.  Chest exam s/p albuterol treatment:   Much improved.  Minimal wheezing at bases.

## 2012-09-07 NOTE — Patient Instructions (Signed)
It was good to see you again today!  Take the prednisone one pill a day for the next 5 days.  Take the minocycline twice a day for the next 7 days until this medicine is finished.  Keep using the albuterol if you need it.  Come back and see [place] in about a week to make sure doing better or sooner if you start getting worse.

## 2012-09-09 ENCOUNTER — Telehealth: Payer: Self-pay | Admitting: Family Medicine

## 2012-09-09 MED ORDER — PANTOPRAZOLE SODIUM 40 MG PO TBEC: 40.0000 mg | DELAYED_RELEASE_TABLET | Freq: Every day | ORAL | Status: DC

## 2012-09-09 NOTE — Telephone Encounter (Signed)
Tried to all patient about changing from nexium to protonix due to formulary change. Unable to reach patient or leave VM. Made change. Letter sent.

## 2012-09-15 ENCOUNTER — Encounter: Payer: Self-pay | Admitting: Sports Medicine

## 2012-09-15 ENCOUNTER — Ambulatory Visit (INDEPENDENT_AMBULATORY_CARE_PROVIDER_SITE_OTHER): Payer: 59 | Admitting: Sports Medicine

## 2012-09-15 VITALS — BP 124/79 | HR 62 | Ht 66.0 in | Wt 241.0 lb

## 2012-09-15 DIAGNOSIS — M25559 Pain in unspecified hip: Secondary | ICD-10-CM

## 2012-09-15 DIAGNOSIS — M25552 Pain in left hip: Secondary | ICD-10-CM

## 2012-09-15 DIAGNOSIS — M7061 Trochanteric bursitis, right hip: Secondary | ICD-10-CM

## 2012-09-15 DIAGNOSIS — M76899 Other specified enthesopathies of unspecified lower limb, excluding foot: Secondary | ICD-10-CM

## 2012-09-15 NOTE — Progress Notes (Signed)
  Subjective:    Patient ID: Holly Mueller, female    DOB: 01/24/1966, 47 y.o.   MRN: [id number]  HPI chief complaint: Bilateral hip and right wrist pain  Patient comes in today with a couple of different complaints. She is complaining of several years of bilateral lateral hip pain. No trauma. X-rays done on April 15 of this year showed no significant degenerative changes. She's not had any real treatment for her hips. Her pain is worse with activity as well as at night. Today, her right hip is fairly tolerable but her left hip is quite painful. No associated numbness or tingling. No deep-seated groin pain. No prior hip surgery. In regards to the right wrist she had an acute onset of pain while simply pushing herself out of bed. This was 2 weeks ago. Pain was severe enough that she went to a local urgent care. No x-rays were done. She was placed into a wrist brace and given a prescription for Ultram. She takes meloxicam chronically. That pain has now resolved.  Past medical history and current medications are all reviewed  allergic to aspirin     Review of Systems     Objective:   Physical Exam Obese 47 year old female in no acute distress. Awake alert and oriented x3. Vital signs are reviewed.  Left hip: Smooth painless hip range of motion with a negative log roll in the sitting position. There is tenderness to palpation over the greater trochanteric bursa. No pain with resisted hip flexion. Negative straight leg raise. No focal neurological deficit of her left lower extremity.  Right hip: Smooth painless hip range of motion with a negative log roll in the sitting position. Minimal tenderness to palpation over the greater trochanteric bursa. No pain with resisted hip flexion. Negative straight leg raise. No focal neurological deficit of her right lower extremity.  Right wrist: Full painless wrist range of motion. No effusion. No soft tissue swelling. There is no bony or soft tissue  tenderness to direct palpation. There is a moderately enlarged ganglion cyst along the radial volar area. Nontender to palpation. Good grip strength. Neurovascularly intact distally.  X-rays are as above       Assessment & Plan:  1. Bilateral hip pain secondary to greater trochanteric bursitis, left greater than right 2. Resolved right wrist pain 3. Ganglion cyst right wrist  Patient is referred to physical therapy for phonophoresis, IT band stretching, and hip abductor strengthening for the more symptomatic left hip. Followup with me in 4 weeks. If pain persists consider cortisone injection into the left greater trochanteric bursa. Her wrist pain has now resolved. The ganglion cyst is chronic and tolerable. If cyst increases in size over comes more painful we can consider aspiration and injection but this will need to be done under ultrasound given its proximity to the radial artery.

## 2012-09-28 ENCOUNTER — Ambulatory Visit: Payer: 59 | Admitting: Family Medicine

## 2012-10-17 ENCOUNTER — Ambulatory Visit: Payer: 59 | Admitting: Sports Medicine

## 2012-10-31 ENCOUNTER — Encounter (HOSPITAL_COMMUNITY): Payer: Self-pay | Admitting: *Deleted

## 2012-10-31 ENCOUNTER — Emergency Department (HOSPITAL_COMMUNITY): Payer: 59

## 2012-10-31 ENCOUNTER — Emergency Department (HOSPITAL_COMMUNITY)
Admission: EM | Admit: 2012-10-31 | Discharge: 2012-10-31 | Disposition: A | Discharge status: Home / Self Care | Payer: 59 | Attending: Emergency Medicine | Admitting: Emergency Medicine

## 2012-10-31 DIAGNOSIS — E039 Hypothyroidism, unspecified: Secondary | ICD-10-CM | POA: Insufficient documentation

## 2012-10-31 DIAGNOSIS — Z79899 Other long term (current) drug therapy: Secondary | ICD-10-CM | POA: Insufficient documentation

## 2012-10-31 DIAGNOSIS — IMO0002 Reserved for concepts with insufficient information to code with codable children: Secondary | ICD-10-CM | POA: Insufficient documentation

## 2012-10-31 DIAGNOSIS — Z8669 Personal history of other diseases of the nervous system and sense organs: Secondary | ICD-10-CM | POA: Insufficient documentation

## 2012-10-31 DIAGNOSIS — M129 Arthropathy, unspecified: Secondary | ICD-10-CM | POA: Insufficient documentation

## 2012-10-31 DIAGNOSIS — G43909 Migraine, unspecified, not intractable, without status migrainosus: Secondary | ICD-10-CM | POA: Insufficient documentation

## 2012-10-31 DIAGNOSIS — J45901 Unspecified asthma with (acute) exacerbation: Secondary | ICD-10-CM | POA: Insufficient documentation

## 2012-10-31 DIAGNOSIS — K219 Gastro-esophageal reflux disease without esophagitis: Secondary | ICD-10-CM | POA: Insufficient documentation

## 2012-10-31 DIAGNOSIS — R002 Palpitations: Secondary | ICD-10-CM | POA: Insufficient documentation

## 2012-10-31 LAB — COMPREHENSIVE METABOLIC PANEL
AST: 81 U/L — ABNORMAL HIGH (ref 0–37)
BUN: 11 mg/dL (ref 6–23)
CO2: 25 mEq/L (ref 19–32)
Calcium: 9 mg/dL (ref 8.4–10.5)
Chloride: 104 mEq/L (ref 96–112)
Creatinine, Ser: 0.6 mg/dL (ref 0.50–1.10)
GFR calc Af Amer: >90 mL/min (ref >90)
GFR calc non Af Amer: >90 mL/min (ref >90)
Total Bilirubin: 0.5 mg/dL (ref 0.3–1.2)

## 2012-10-31 LAB — CBC
HCT: 37.8 % (ref 36.0–46.0)
HCT: 37.7 % (ref 36.0–46.0)
Hemoglobin: 13.3 g/dL (ref 12.0–15.0)
MCH: 28.9 pg (ref 26.0–34.0)
MCHC: 35.2 g/dL (ref 30.0–36.0)
MCV: 83.6 fL (ref 78.0–100.0)
MCV: 83 fL (ref 78.0–100.0)
Platelets: DECREASED 10*3/uL (ref 150–400)
RBC: 4.54 MIL/uL (ref 3.87–5.11)
RDW: 13.1 % (ref 11.5–15.5)

## 2012-10-31 LAB — BASIC METABOLIC PANEL
BUN: 11 mg/dL (ref 6–23)
Chloride: 103 mEq/L (ref 96–112)
Creatinine, Ser: 0.61 mg/dL (ref 0.50–1.10)
GFR calc Af Amer: >90 mL/min (ref >90)
GFR calc non Af Amer: >90 mL/min (ref >90)
Glucose, Bld: 117 mg/dL — ABNORMAL HIGH (ref 70–99)

## 2012-10-31 LAB — TROPONIN I: Troponin I: <0.3 ng/mL (ref <0.30)

## 2012-10-31 LAB — POCT I-STAT TROPONIN I: Troponin i, poc: 0 ng/mL (ref 0.00–0.08)

## 2012-10-31 NOTE — ED Notes (Signed)
PT states sitting down today and became sob with rapid HR.  Pt was diaphoretic when rapid response arrived to her.  No chest pain.

## 2012-10-31 NOTE — ED Provider Notes (Signed)
History     CSN: [id number]  Arrival date & time 10/31/12  4098   First MD Initiated Contact with Patient 10/31/12 1124      Chief Complaint  Patient presents with  . Shortness of Breath  . Rapid rate     (Consider location/radiation/quality/duration/timing/severity/associated sxs/prior treatment) Patient is a 47 y.o. female presenting with shortness of breath. The history is provided by the patient.  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 States heart beat felt rapid or fast. Episodic, lasted seconds. No faintness or lightheadedness. No syncope. States has happened in past, but no prior dx dysrhythmia. No hx afib or svt. No hx cad. Denies any current or recent cp or discomfort. No unusual doe or fatigue. No associated nv or diaphoresis. When present, denies specific exacerbating or alleviating factors. Denies heavy caffeine use, energy drink use, or drug use. No recent change in meds/doses. Takes synthroid. No heat intolerance, sweats, or recent wt change. No leg pain or swelling. No dvt or pe hx . No fam hx premature cad. No dysuria or gu c/o. No fever or chills. Denies cough or uri c/o.   Past Medical History  Diagnosis Date  . Asthma 2004  . GERD (gastroesophageal reflux disease) 2008  . Epilepsy in 1972    No seizures since 1972. Previously treated with phenobarbital.   . Hypothyroidism 2008  . Seasonal allergies 2003  . Arthritis   . Headache(784.0)     hx of migraines     Past Surgical History  Procedure Laterality Date  . Novasure ablation    . C section      x2  . R shoulder scope  2011  . Cesarean section      x2  . Wisdom tooth extraction    . Ventral hernia repair N/A 06/30/2012    Procedure: LAPAROSCOPIC VENTRAL HERNIA;  Surgeon: Shann Medal, MD;   Location: [facility];  Service: General;  Laterality: N/A;  With Mesh  . Umbilical hernia repair N/A 06/30/2012    Procedure: remove umbilicus;  Surgeon: Shann Medal, MD;  Location: [facility];  Service: General;  Laterality: N/A;  . Liver biopsy  06/30/2012    Procedure: LIVER BIOPSY;  Surgeon: Shann Medal, MD;  Location: [facility];  Service: General;;    Family History  Problem Relation Age of Onset  . Hypertension Mother   . Diabetes Mother   . Lung cancer Father   . Stroke Father   . Diabetes Sister   . Hypertension Sister     History  Substance Use Topics  . Smoking status: Never Smoker   . Smokeless tobacco: Never Used  . Alcohol Use: 0.0 oz/week     Comment: special occasions.     OB History   Grav Para Term Preterm Abortions TAB SAB Ect Mult Living                  Review of Systems  Constitutional: Negative for fever and chills.  HENT: Negative for neck pain.   Eyes: Negative for redness and visual disturbance.  Respiratory: Positive for shortness of breath. Negative for cough.   Cardiovascular: Positive for palpitations. Negative for chest pain and leg swelling.  Gastrointestinal: Negative for vomiting, abdominal pain and diarrhea.  Genitourinary:  Negative for dysuria and flank pain.  Musculoskeletal: Negative for back pain.  Skin: Negative for rash.  Neurological: Negative for weakness, numbness and headaches.  Hematological: Does not bruise/bleed easily.  Psychiatric/Behavioral: Negative for confusion.    Allergies  Aspirin  Home Medications   Current Outpatient Rx  Name  Route  Sig  Dispense  Refill  . albuterol (PROVENTIL HFA;VENTOLIN HFA) 108 (90 BASE) MCG/ACT inhaler   Inhalation   Inhale 2 puffs into the lungs every 6 (six) hours as needed. Shortness of breath         . albuterol (PROVENTIL) (2.5 MG/3ML) 0.083% nebulizer solution   Nebulization   Take 3 mLs (2.5 mg total) by nebulization every 6 (six) hours as needed for wheezing.   150 mL    1   . amitriptyline (ELAVIL) 10 MG tablet   Oral   Take 20 mg by mouth at bedtime.         . budesonide-formoterol (SYMBICORT) 160-4.5 MCG/ACT inhaler   Inhalation   Inhale 2 puffs into the lungs 2 (two) times daily.         . cetirizine (ZYRTEC) 10 MG tablet   Oral   Take 10 mg by mouth daily.         Marland Kitchen levothyroxine (SYNTHROID, LEVOTHROID) 300 MCG tablet   Oral   Take 1 tablet (300 mcg total) by mouth daily.   90 tablet   1   . meloxicam (MOBIC) 15 MG tablet   Oral   Take 1 tablet (15 mg total) by mouth daily.   30 tablet   0   . Naproxen Sodium (ALEVE PO)   Oral   Take 2 tablets by mouth 2 (two) times daily as needed (hip pain).         . pantoprazole (PROTONIX) 40 MG tablet   Oral   Take 1 tablet (40 mg total) by mouth daily.   30 tablet   11   . EXPIRED: fluticasone (FLONASE) 50 MCG/ACT nasal spray   Nasal   Place 2 sprays into the nose daily.   16 g   6   . minocycline (MINOCIN) 100 MG capsule   Oral   Take 1 capsule (100 mg total) by mouth 2 (two) times daily.   14 capsule   0   . predniSONE (DELTASONE) 50 MG tablet      Take 1 pill daily for the next 5 days   5 tablet   0     BP 119/76  Pulse 83  Temp(Src) 98.2 F (36.8 C) (Oral)  Resp 20  SpO2 96%  Physical Exam  Nursing note and vitals reviewed. Constitutional: She is oriented to person, place, and time. She appears well-developed and well-nourished. No distress.  HENT:  Nose: Nose normal.  Mouth/Throat: Oropharynx is clear and moist.  Eyes: Conjunctivae are normal. No scleral icterus.  Neck: Neck supple. No tracheal deviation present. No thyromegaly present.  Cardiovascular: Normal rate, regular rhythm, normal heart sounds and intact distal pulses.  Exam reveals no gallop and no friction rub.   No murmur heard. Pulmonary/Chest: Effort normal. No respiratory distress. She exhibits no tenderness.  Abdominal: Soft. Normal appearance. She exhibits no distension. There is no  tenderness.  Genitourinary:  No cva tenderness.   Musculoskeletal: She exhibits no edema and no tenderness.  Neurological: She is alert and oriented to person, place, and time.  Skin: Skin is warm and dry. No rash noted.  Psychiatric: She has a normal mood  and affect.    ED Course  Procedures (including critical care time)   Results for orders placed during the hospital encounter of 10/31/12  CBC      Result Value Range   WBC 3.7 (*) 4.0 - 10.5 K/uL   RBC 4.52  3.87 - 5.11 MIL/uL   Hemoglobin 13.3  12.0 - 15.0 g/dL   HCT 37.8  36.0 - 46.0 %   MCV 83.6  78.0 - 100.0 fL   MCH 29.4  26.0 - 34.0 pg   MCHC 35.2  30.0 - 36.0 g/dL   RDW 13.1  11.5 - 15.5 %   Platelets 126 (*) 150 - 400 K/uL  BASIC METABOLIC PANEL      Result Value Range   Sodium 135  135 - 145 mEq/L   Potassium 3.6  3.5 - 5.1 mEq/L   Chloride 103  96 - 112 mEq/L   CO2 24  19 - 32 mEq/L   Glucose, Bld 117 (*) 70 - 99 mg/dL   BUN 11  6 - 23 mg/dL   Creatinine, Ser 0.61  0.50 - 1.10 mg/dL   Calcium 8.9  8.4 - 10.5 mg/dL   GFR calc non Af Amer >90  >90 mL/min   GFR calc Af Amer >90  >90 mL/min  CBC      Result Value Range   WBC 3.8 (*) 4.0 - 10.5 K/uL   RBC 4.54  3.87 - 5.11 MIL/uL   Hemoglobin 13.1  12.0 - 15.0 g/dL   HCT 37.7  36.0 - 46.0 %   MCV 83.0  78.0 - 100.0 fL   MCH 28.9  26.0 - 34.0 pg   MCHC 34.7  30.0 - 36.0 g/dL   RDW 13.2  11.5 - 15.5 %   Platelets    150 - 400 K/uL   Value: PLATELET CLUMPS NOTED ON SMEAR, COUNT APPEARS DECREASED  COMPREHENSIVE METABOLIC PANEL      Result Value Range   Sodium 135  135 - 145 mEq/L   Potassium 3.8  3.5 - 5.1 mEq/L   Chloride 104  96 - 112 mEq/L   CO2 25  19 - 32 mEq/L   Glucose, Bld 98  70 - 99 mg/dL   BUN 11  6 - 23 mg/dL   Creatinine, Ser 0.60  0.50 - 1.10 mg/dL   Calcium 9.0  8.4 - 10.5 mg/dL   Total Protein 8.3  6.0 - 8.3 g/dL   Albumin 3.1 (*) 3.5 - 5.2 g/dL   AST 81 (*) 0 - 37 U/L   ALT 81 (*) 0 - 35 U/L   Alkaline Phosphatase 153 (*) 39 - 117  U/L   Total Bilirubin 0.5  0.3 - 1.2 mg/dL   GFR calc non Af Amer >90  >90 mL/min   GFR calc Af Amer >90  >90 mL/min  TROPONIN I      Result Value Range   Troponin I <0.30  <0.30 ng/mL  POCT I-STAT TROPONIN I      Result Value Range   Troponin i, poc 0.01  0.00 - 0.08 ng/mL   Comment 3           POCT I-STAT TROPONIN I      Result Value Range   Troponin i, poc 0.00  0.00 - 0.08 ng/mL   Comment 3            Dg Chest 2 View  10/31/2012   *RADIOLOGY REPORT*  Clinical Data: Shortness  of breath.  Elevated heart rate.  CHEST - 2 VIEW  Comparison: 02/03/2010  Findings: Midline trachea.  Normal heart size and mediastinal contours. No pleural effusion or pneumothorax.  Mild bibasilar volume loss with left greater than right subsegmental atelectasis. No congestive failure.  IMPRESSION: No acute cardiopulmonary disease.  Minimal bibasilar volume loss with subsegmental atelectasis.   Original Report Authenticated By: Abigail Miyamoto, M.D.      MDM  Iv ns. Labs.   Cxr.  Reviewed nursing notes and prior charts for additional history.    Date: 10/31/2012  Rate: 76  Rhythm: normal sinus rhythm  QRS Axis: normal  Intervals: normal  ST/T Wave abnormalities: normal  Conduction Disutrbances:none  Narrative Interpretation:   Old EKG Reviewed: unchanged  Recheck pt remains asymptomatic, nsr, no dysrhythmia.   Pt denies any cp or sob on recheck. Nsr.  Pt appears stable for d/c. Will refer to card follow up, possible outpt holter.          Mirna Mires, MD 10/31/12 1340

## 2012-11-10 ENCOUNTER — Telehealth: Payer: Self-pay | Admitting: Family Medicine

## 2012-11-10 NOTE — Telephone Encounter (Signed)
Spoke with Holly Mueller about f/u in clinic for pre-op clearance, she will call back today to schedule an appointment   By Langston Masker, MD

## 2013-01-11 ENCOUNTER — Encounter: Payer: 59 | Admitting: Cardiology

## 2013-01-18 ENCOUNTER — Emergency Department (HOSPITAL_COMMUNITY)
Admission: EM | Admit: 2013-01-18 | Discharge: 2013-01-18 | Disposition: A | Discharge status: Home / Self Care | Payer: 59 | Attending: Emergency Medicine | Admitting: Emergency Medicine

## 2013-01-18 ENCOUNTER — Encounter (HOSPITAL_COMMUNITY): Payer: Self-pay | Admitting: Emergency Medicine

## 2013-01-18 ENCOUNTER — Other Ambulatory Visit: Payer: Self-pay

## 2013-01-18 ENCOUNTER — Emergency Department (HOSPITAL_COMMUNITY): Payer: 59

## 2013-01-18 DIAGNOSIS — J45901 Unspecified asthma with (acute) exacerbation: Secondary | ICD-10-CM | POA: Insufficient documentation

## 2013-01-18 DIAGNOSIS — J40 Bronchitis, not specified as acute or chronic: Secondary | ICD-10-CM

## 2013-01-18 DIAGNOSIS — E669 Obesity, unspecified: Secondary | ICD-10-CM | POA: Insufficient documentation

## 2013-01-18 DIAGNOSIS — IMO0002 Reserved for concepts with insufficient information to code with codable children: Secondary | ICD-10-CM | POA: Insufficient documentation

## 2013-01-18 DIAGNOSIS — K219 Gastro-esophageal reflux disease without esophagitis: Secondary | ICD-10-CM | POA: Insufficient documentation

## 2013-01-18 DIAGNOSIS — Z791 Long term (current) use of non-steroidal anti-inflammatories (NSAID): Secondary | ICD-10-CM | POA: Insufficient documentation

## 2013-01-18 DIAGNOSIS — R42 Dizziness and giddiness: Secondary | ICD-10-CM | POA: Insufficient documentation

## 2013-01-18 DIAGNOSIS — R0789 Other chest pain: Secondary | ICD-10-CM | POA: Insufficient documentation

## 2013-01-18 DIAGNOSIS — E039 Hypothyroidism, unspecified: Secondary | ICD-10-CM | POA: Insufficient documentation

## 2013-01-18 DIAGNOSIS — Z79899 Other long term (current) drug therapy: Secondary | ICD-10-CM | POA: Insufficient documentation

## 2013-01-18 DIAGNOSIS — M259 Joint disorder, unspecified: Secondary | ICD-10-CM | POA: Insufficient documentation

## 2013-01-18 DIAGNOSIS — Z8669 Personal history of other diseases of the nervous system and sense organs: Secondary | ICD-10-CM | POA: Insufficient documentation

## 2013-01-18 LAB — BASIC METABOLIC PANEL
BUN: 9 mg/dL (ref 6–23)
Chloride: 104 mEq/L (ref 96–112)
GFR calc Af Amer: >90 mL/min (ref >90)
GFR calc non Af Amer: 83 mL/min — ABNORMAL LOW (ref >90)
Potassium: 3.8 mEq/L (ref 3.5–5.1)
Sodium: 136 mEq/L (ref 135–145)

## 2013-01-18 LAB — CBC WITH DIFFERENTIAL/PLATELET
Basophils Absolute: 0 10*3/uL (ref 0.0–0.1)
Basophils Relative: 1 % (ref 0–1)
Eosinophils Absolute: 0.3 10*3/uL (ref 0.0–0.7)
Hemoglobin: 12.2 g/dL (ref 12.0–15.0)
MCH: 30 pg (ref 26.0–34.0)
MCHC: 35.6 g/dL (ref 30.0–36.0)
Monocytes Relative: 9 % (ref 3–12)
Neutro Abs: 2.1 10*3/uL (ref 1.7–7.7)
Neutrophils Relative %: 55 % (ref 43–77)
Platelets: 143 10*3/uL — ABNORMAL LOW (ref 150–400)
RDW: 13.2 % (ref 11.5–15.5)

## 2013-01-18 MED ORDER — ALBUTEROL SULFATE (5 MG/ML) 0.5% IN NEBU
2.5000 mg | INHALATION_SOLUTION | Freq: Once | RESPIRATORY_TRACT | Status: AC
  Administered 2013-01-18: 2.5 mg via RESPIRATORY_TRACT
  Filled 2013-01-18: qty 0.5

## 2013-01-18 MED ORDER — AZITHROMYCIN 250 MG PO TABS: 250.0000 mg | ORAL_TABLET | Freq: Every day | ORAL | Status: DC

## 2013-01-18 NOTE — ED Notes (Signed)
Pt at work in [place] and was SOB and had near syncopal event, reports 1w of cough, SOB, and chest tightness, NAD

## 2013-01-18 NOTE — ED Provider Notes (Signed)
CSN: [id number]     Arrival date & time 01/18/13  1216 History   First MD Initiated Contact with Patient 01/18/13 1333     Chief Complaint  Patient presents with  . Near Syncope   (Consider location/radiation/quality/duration/timing/severity/associated sxs/prior Treatment) HPI Comments: Patient is a 47 year old female with a past medical history of seizures, obesity, asthma who presents with a 1 week history of SOB and productive cough. Symptoms started gradually and progressively worsened since the onset. The cough has been productive with yellow/green sputum. Patient came to the ED today because she had a coughing fit where she became lightheaded and SOB. She reports some chest tightness. No aggravating/alleviating factors. Patient has not tried anything for symptoms. Patient denies PO hormones, recent travel/surgery, previous DVT/PE.    Past Medical History  Diagnosis Date  . Asthma 2004  . GERD (gastroesophageal reflux disease) 2008  . Epilepsy in 1972    No seizures since 1972. Previously treated with phenobarbital.   . Hypothyroidism 2008  . Seasonal allergies 2003  . Arthritis   . Headache(784.0)     hx of migraines    Past Surgical History  Procedure Laterality Date  . Novasure ablation    . C section      x2  . R shoulder scope  2011  . Cesarean section      x2  . Wisdom tooth extraction    . Ventral hernia repair N/A 06/30/2012    Procedure: LAPAROSCOPIC VENTRAL HERNIA;  Surgeon: Shann Medal, MD;  Location: [facility];  Service: General;  Laterality: N/A;  With Mesh  . Umbilical hernia repair N/A 06/30/2012    Procedure: remove umbilicus;  Surgeon: Shann Medal, MD;  Location: [facility];  Service: General;  Laterality: N/A;  . Liver biopsy  06/30/2012    Procedure: LIVER BIOPSY;  Surgeon: Shann Medal, MD;  Location: [facility];  Service: General;;   Family History  Problem Relation Age of Onset  . Hypertension Mother   . Diabetes Mother   . Lung cancer Father   .  Stroke Father   . Diabetes Sister   . Hypertension Sister    History  Substance Use Topics  . Smoking status: Never Smoker   . Smokeless tobacco: Never Used  . Alcohol Use: 0.0 oz/week     Comment: special occasions.    OB History   Grav Para Term Preterm Abortions TAB SAB Ect Mult Living                 Review of Systems  Respiratory: Positive for cough, chest tightness and shortness of breath.   Neurological: Positive for light-headedness.  All other systems reviewed and are negative.    Allergies  Aspirin  Home Medications   Current Outpatient Rx  Name  Route  Sig  Dispense  Refill  . albuterol (PROVENTIL HFA;VENTOLIN HFA) 108 (90 BASE) MCG/ACT inhaler   Inhalation   Inhale 2 puffs into the lungs every 6 (six) hours as needed. Shortness of breath         . albuterol (PROVENTIL) (2.5 MG/3ML) 0.083% nebulizer solution   Nebulization   Take 3 mLs (2.5 mg total) by nebulization every 6 (six) hours as needed for wheezing.   150 mL   1   . amitriptyline (ELAVIL) 10 MG tablet   Oral   Take 20 mg by mouth at bedtime.         . budesonide-formoterol (SYMBICORT) 160-4.5 MCG/ACT inhaler  Inhalation   Inhale 2 puffs into the lungs 2 (two) times daily.         . cetirizine (ZYRTEC) 10 MG tablet   Oral   Take 10 mg by mouth daily.         . fluticasone (FLONASE) 50 MCG/ACT nasal spray   Nasal   Place 2 sprays into the nose daily.         Marland Kitchen ibuprofen (ADVIL,MOTRIN) 200 MG tablet   Oral   Take 400 mg by mouth every 6 (six) hours as needed for pain.         Marland Kitchen levothyroxine (SYNTHROID, LEVOTHROID) 300 MCG tablet   Oral   Take 300 mcg by mouth daily before breakfast.         . meloxicam (MOBIC) 15 MG tablet   Oral   Take 15 mg by mouth daily.         . Naproxen Sodium (ALEVE PO)   Oral   Take 2 tablets by mouth 2 (two) times daily as needed (hip pain).         . pantoprazole (PROTONIX) 40 MG tablet   Oral   Take 40 mg by mouth daily.           BP 99/61  Pulse 61  Temp(Src) 98.2 F (36.8 C) (Oral)  Resp 18  Ht _0  (1.676 m)  Wt 253 lb (114.76 kg)  BMI 40.85 kg/m2  SpO2 95% Physical Exam  Nursing note and vitals reviewed. Constitutional: She is oriented to person, place, and time. She appears well-developed and well-nourished. No distress.  HENT:  Head: Normocephalic and atraumatic.  Eyes: Conjunctivae and EOM are normal.  Neck: Normal range of motion.  Cardiovascular: Normal rate and regular rhythm.  Exam reveals no gallop and no friction rub.   No murmur heard. Pulmonary/Chest: Effort normal and breath sounds normal. She has no wheezes. She has no rales. She exhibits no tenderness.  Abdominal: Soft. She exhibits no distension. There is no tenderness. There is no rebound and no guarding.  Musculoskeletal: Normal range of motion.  Neurological: She is alert and oriented to person, place, and time. Coordination normal.  Speech is goal-oriented. Moves limbs without ataxia.   Skin: Skin is warm and dry.  Psychiatric: She has a normal mood and affect. Her behavior is normal.    ED Course  Procedures (including critical care time) Labs Review Labs Reviewed  CBC WITH DIFFERENTIAL - Abnormal; Notable for the following:    WBC 3.9 (*)    HCT 34.3 (*)    Platelets 143 (*)    Eosinophils Relative 6 (*)    All other components within normal limits  BASIC METABOLIC PANEL - Abnormal; Notable for the following:    GFR calc non Af Amer 83 (*)    All other components within normal limits   Imaging Review Dg Chest 2 View  01/18/2013   CLINICAL DATA:  Near syncope, mid chest pain, difficulty breathing, cough, history asthma  EXAM: CHEST  2 VIEW  COMPARISON:  10/31/2012  FINDINGS: Normal heart size, mediastinal contours, and pulmonary vascularity.  Mild to moderate peribronchial thickening which could reflect bronchitis or asthma.  Minimal chronic atelectasis at left base.  No definite acute infiltrate, pleural effusion or  pneumothorax.  Bones unremarkable.  IMPRESSION: Mild to moderate peribronchial thickening question bronchitis or asthma.  Minimal chronic left basilar atelectasis without acute infiltrate.   Electronically Signed   By: Lavonia Dana M.D.   On:  01/18/2013 13:09    MDM   1. Bronchitis     2:13 PM Patient's labs and chest xray unremarkable for acute changes. Vitals stable and patient afebrile. Patient is PERC negative. Patient will have albuterol nebulizer here. Patient likely has bronchitis and will be treated with PO antibiotics.   3:54 PM Patient reports some improvement with albuterol nebulizer. Patient will be discharged with Azithromycin and instructions to follow up with her PCP. Vitals stable and patient afebrile.   Alvina Chou, PA-C 01/18/13 1555

## 2013-01-21 NOTE — ED Provider Notes (Signed)
Medical screening examination/treatment/procedure(s) were performed by non-physician practitioner and as supervising physician I was immediately available for consultation/collaboration.  Virgel Manifold, MD 01/21/13 [phone]

## 2013-01-31 ENCOUNTER — Ambulatory Visit: Payer: 59 | Admitting: Family Medicine

## 2013-02-01 ENCOUNTER — Encounter: Payer: Self-pay | Admitting: Family Medicine

## 2013-02-01 ENCOUNTER — Ambulatory Visit (INDEPENDENT_AMBULATORY_CARE_PROVIDER_SITE_OTHER): Payer: 59 | Admitting: Family Medicine

## 2013-02-01 VITALS — BP 114/81 | HR 79 | Temp 98.9°F | Ht 66.0 in | Wt 241.0 lb

## 2013-02-01 DIAGNOSIS — J45909 Unspecified asthma, uncomplicated: Secondary | ICD-10-CM

## 2013-02-01 MED ORDER — PREDNISONE 20 MG PO TABS: 40.0000 mg | ORAL_TABLET | Freq: Every day | ORAL | Status: DC

## 2013-02-01 NOTE — Patient Instructions (Addendum)
It seems that you have an exacerbation of your asthma.  Please take the medication as prescribed:prednisone 2 tablets daily for 5 days. Continue using your albuterol inhaler every 4 hours then every 6h as needed for shortness of breath or wheezing Your symptoms continue to worsen or you develop new symptoms please get evaluated right away

## 2013-02-01 NOTE — Progress Notes (Signed)
Family Medicine Office Visit Note   Subjective:   Patient ID: Holly Mueller, female  DOB: 29-Jan-1966, 48 y.o.. MRN: [id number]   Pt that comes to same-day appointment complaining of cough. She has Hx of Asthma and has recently been evaluated in ED for an episode of Bronchitis. She finished a course of abx (azithro) ands report remarkable improvement of her symptoms. She comes today because she continues to have dry-non productive cough that exacerbates at night and intermittent chest tightness.  Denies SOB, chest pain, palpitations, fever, chills or other systemic symptoms.  Review of Systems:  Per HPI Objective:   Physical Exam: Gen:  NAD HEENT: Moist mucous membranes  CV: Regular rate and rhythm, no murmurs rubs or gallops PULM: normal WOB. Rhonchi and wheezes present. No rales  EXT: No edema  Assessment & Plan:

## 2013-02-01 NOTE — Assessment & Plan Note (Signed)
On acute exacerbation. Already one course of abx recently finished. Normal O2 sat. Recommended albuterol Q4h initially then Q6h PRN Prednisone 32m x 5 days. Discussed signs of worsening condition that should prompt re-evaluation.

## 2013-02-02 ENCOUNTER — Telehealth: Payer: Self-pay | Admitting: Family Medicine

## 2013-02-02 NOTE — Telephone Encounter (Signed)
Letter faxed to work [id number] requested by patient,unable to inform patient that letter was faxed, phone is out of service. Holly Mueller, Holly Mueller

## 2013-02-02 NOTE — Telephone Encounter (Signed)
Pt called to check the status of the letter that she needs stating how long she will be out of work.She would like [place] to fax it to her job at [phone]. JW

## 2013-03-16 ENCOUNTER — Other Ambulatory Visit: Payer: Self-pay

## 2013-03-23 ENCOUNTER — Ambulatory Visit: Payer: 59 | Admitting: Family Medicine

## 2013-04-25 ENCOUNTER — Emergency Department (HOSPITAL_COMMUNITY): Payer: 59

## 2013-04-25 ENCOUNTER — Encounter (HOSPITAL_COMMUNITY): Payer: Self-pay | Admitting: Emergency Medicine

## 2013-04-25 ENCOUNTER — Emergency Department (HOSPITAL_COMMUNITY)
Admission: EM | Admit: 2013-04-25 | Discharge: 2013-04-25 | Disposition: A | Discharge status: Home / Self Care | Payer: 59 | Attending: Emergency Medicine | Admitting: Emergency Medicine

## 2013-04-25 DIAGNOSIS — Z8679 Personal history of other diseases of the circulatory system: Secondary | ICD-10-CM | POA: Insufficient documentation

## 2013-04-25 DIAGNOSIS — Z791 Long term (current) use of non-steroidal anti-inflammatories (NSAID): Secondary | ICD-10-CM | POA: Insufficient documentation

## 2013-04-25 DIAGNOSIS — Z79899 Other long term (current) drug therapy: Secondary | ICD-10-CM | POA: Insufficient documentation

## 2013-04-25 DIAGNOSIS — J45909 Unspecified asthma, uncomplicated: Secondary | ICD-10-CM

## 2013-04-25 DIAGNOSIS — J45901 Unspecified asthma with (acute) exacerbation: Secondary | ICD-10-CM | POA: Insufficient documentation

## 2013-04-25 DIAGNOSIS — J209 Acute bronchitis, unspecified: Secondary | ICD-10-CM | POA: Insufficient documentation

## 2013-04-25 DIAGNOSIS — H9209 Otalgia, unspecified ear: Secondary | ICD-10-CM | POA: Insufficient documentation

## 2013-04-25 DIAGNOSIS — K219 Gastro-esophageal reflux disease without esophagitis: Secondary | ICD-10-CM | POA: Insufficient documentation

## 2013-04-25 DIAGNOSIS — Z8669 Personal history of other diseases of the nervous system and sense organs: Secondary | ICD-10-CM | POA: Insufficient documentation

## 2013-04-25 DIAGNOSIS — E039 Hypothyroidism, unspecified: Secondary | ICD-10-CM | POA: Insufficient documentation

## 2013-04-25 DIAGNOSIS — M129 Arthropathy, unspecified: Secondary | ICD-10-CM | POA: Insufficient documentation

## 2013-04-25 DIAGNOSIS — IMO0002 Reserved for concepts with insufficient information to code with codable children: Secondary | ICD-10-CM | POA: Insufficient documentation

## 2013-04-25 DIAGNOSIS — J40 Bronchitis, not specified as acute or chronic: Secondary | ICD-10-CM

## 2013-04-25 LAB — BASIC METABOLIC PANEL
BUN: 14 mg/dL (ref 6–23)
Calcium: 9.4 mg/dL (ref 8.4–10.5)
GFR calc non Af Amer: >90 mL/min (ref >90)
Glucose, Bld: 84 mg/dL (ref 70–99)
Sodium: 134 mEq/L — ABNORMAL LOW (ref 135–145)

## 2013-04-25 LAB — CBC
HCT: 37.7 % (ref 36.0–46.0)
Hemoglobin: 13.2 g/dL (ref 12.0–15.0)
MCH: 30.1 pg (ref 26.0–34.0)
MCHC: 35 g/dL (ref 30.0–36.0)
Platelets: 164 10*3/uL (ref 150–400)
RBC: 4.39 MIL/uL (ref 3.87–5.11)

## 2013-04-25 LAB — POCT I-STAT TROPONIN I: Troponin i, poc: 0 ng/mL (ref 0.00–0.08)

## 2013-04-25 MED ORDER — IPRATROPIUM BROMIDE 0.02 % IN SOLN
0.5000 mg | Freq: Once | RESPIRATORY_TRACT | Status: AC
  Administered 2013-04-25: 0.5 mg via RESPIRATORY_TRACT
  Filled 2013-04-25: qty 2.5

## 2013-04-25 MED ORDER — PREDNISONE 20 MG PO TABS: ORAL_TABLET | ORAL | Status: DC

## 2013-04-25 MED ORDER — ALBUTEROL SULFATE (5 MG/ML) 0.5% IN NEBU
5.0000 mg | INHALATION_SOLUTION | Freq: Once | RESPIRATORY_TRACT | Status: AC
  Administered 2013-04-25: 5 mg via RESPIRATORY_TRACT
  Filled 2013-04-25: qty 1

## 2013-04-25 MED ORDER — BENZONATATE 100 MG PO CAPS: 200.0000 mg | ORAL_CAPSULE | Freq: Two times a day (BID) | ORAL | Status: DC | PRN

## 2013-04-25 MED ORDER — AZITHROMYCIN 250 MG PO TABS: ORAL_TABLET | ORAL | Status: DC

## 2013-04-25 NOTE — ED Provider Notes (Signed)
Medical screening examination/treatment/procedure(s) were performed by non-physician practitioner and as supervising physician I was immediately available for consultation/collaboration.  EKG Interpretation    Date/Time:  Tuesday April 25 2013 15:40:56 EST Ventricular Rate:  66 PR Interval:  162 QRS Duration: 82 QT Interval:  402 QTC Calculation: 421 R Axis:   26 Text Interpretation:  Normal sinus rhythm No significant change since last tracing Confirmed by Elveta Rape  DO, Chasty Randal ([id number]) on 04/25/2013 6:32:59 PM              [place], DO 04/25/13 2120

## 2013-04-25 NOTE — ED Provider Notes (Signed)
CSN: [id number]     Arrival date & time 04/25/13  1533 History   First MD Initiated Contact with Patient 04/25/13 [place]     Chief Complaint  Patient presents with  . Asthma  . Chest Pain   (Consider location/radiation/quality/duration/timing/severity/associated sxs/prior Treatment) HPI Comments: Patient is a 47 year old female with history of asthma, epilepsy, hypothyroidism who presents today with shortness of breath. She reports that this feels like her normal asthma exacerbation. Her symptoms began earlier today and she has had associated chest tightness and cough. Her cough is worse at night. The cough has been worsening for the past week. She had associated sinus congestion which she feels has now traveled to her lungs. She is using her albuterol every 4 hours as needed with little relief. She has never been hospitalized or intubated for her asthma. No fevers, chills, nausea, vomiting, abdominal pain.   The history is provided by the patient. No language interpreter was used.    Past Medical History  Diagnosis Date  . Asthma 2004  . GERD (gastroesophageal reflux disease) 2008  . Epilepsy in 1972    No seizures since 1972. Previously treated with phenobarbital.   . Hypothyroidism 2008  . Seasonal allergies 2003  . Arthritis   . Headache(784.0)     hx of migraines    Past Surgical History  Procedure Laterality Date  . Novasure ablation    . C section      x2  . R shoulder scope  2011  . Cesarean section      x2  . Wisdom tooth extraction    . Ventral hernia repair N/A 06/30/2012    Procedure: LAPAROSCOPIC VENTRAL HERNIA;  Surgeon: Shann Medal, MD;  Location: [facility];  Service: General;  Laterality: N/A;  With Mesh  . Umbilical hernia repair N/A 06/30/2012    Procedure: remove umbilicus;  Surgeon: Shann Medal, MD;  Location: [facility];  Service: General;  Laterality: N/A;  . Liver biopsy  06/30/2012    Procedure: LIVER BIOPSY;  Surgeon: Shann Medal, MD;  Location: [facility];   Service: General;;   Family History  Problem Relation Age of Onset  . Hypertension Mother   . Diabetes Mother   . Lung cancer Father   . Stroke Father   . Diabetes Sister   . Hypertension Sister    History  Substance Use Topics  . Smoking status: Never Smoker   . Smokeless tobacco: Never Used  . Alcohol Use: 0.0 oz/week     Comment: special occasions.    OB History   Grav Para Term Preterm Abortions TAB SAB Ect Mult Living                 Review of Systems  Constitutional: Negative for fever and chills.  HENT: Positive for ear pain and sinus pressure.   Respiratory: Positive for cough, chest tightness and shortness of breath.   Gastrointestinal: Negative for nausea, vomiting and abdominal pain.  All other systems reviewed and are negative.    Allergies  Aspirin  Home Medications   Current Outpatient Rx  Name  Route  Sig  Dispense  Refill  . albuterol (PROVENTIL HFA;VENTOLIN HFA) 108 (90 BASE) MCG/ACT inhaler   Inhalation   Inhale 2 puffs into the lungs every 6 (six) hours as needed. Shortness of breath         . albuterol (PROVENTIL) (2.5 MG/3ML) 0.083% nebulizer solution   Nebulization   Take 3 mLs (2.5 mg total)  by nebulization every 6 (six) hours as needed for wheezing.   150 mL   1   . budesonide-formoterol (SYMBICORT) 160-4.5 MCG/ACT inhaler   Inhalation   Inhale 2 puffs into the lungs 2 (two) times daily.         . cetirizine (ZYRTEC) 10 MG tablet   Oral   Take 10 mg by mouth daily.         . fluticasone (FLONASE) 50 MCG/ACT nasal spray   Nasal   Place 2 sprays into the nose daily.         Marland Kitchen ibuprofen (ADVIL,MOTRIN) 200 MG tablet   Oral   Take 400 mg by mouth every 6 (six) hours as needed for pain.         Marland Kitchen levothyroxine (SYNTHROID, LEVOTHROID) 300 MCG tablet   Oral   Take 300 mcg by mouth daily before breakfast.         . meloxicam (MOBIC) 15 MG tablet   Oral   Take 15 mg by mouth daily.         . Naproxen Sodium (ALEVE  PO)   Oral   Take 2 tablets by mouth 2 (two) times daily as needed (hip pain).         . pantoprazole (PROTONIX) 40 MG tablet   Oral   Take 40 mg by mouth daily.          BP 107/78  Pulse 80  Temp(Src) 97.7 F (36.5 C) (Oral)  Resp 18  Ht _0  (1.676 m)  Wt 242 lb 1.6 oz (109.816 kg)  BMI 39.09 kg/m2  SpO2 94% Physical Exam  Nursing note and vitals reviewed. Constitutional: She is oriented to person, place, and time. She appears well-developed and well-nourished. She does not appear ill. No distress.  Wearing reindeer antlers   HENT:  Head: Normocephalic and atraumatic.  Right Ear: External ear normal. Tympanic membrane is retracted.  Left Ear: External ear normal. Tympanic membrane is retracted.  Nose: Nose normal.  Mouth/Throat: Uvula is midline, oropharynx is clear and moist and mucous membranes are normal.  Swollen nasal turbinates bilaterally  Eyes: Conjunctivae are normal.  Neck: Normal range of motion.  No nuchal rigidity or meningeal signs  Cardiovascular: Normal rate, regular rhythm, normal heart sounds, intact distal pulses and normal pulses.   No murmur heard. Pulmonary/Chest: Effort normal. No stridor. No respiratory distress. She has wheezes (expiratory). She has no rales.  Abdominal: Soft. She exhibits no distension. There is no tenderness.  Musculoskeletal: Normal range of motion.  Neurological: She is alert and oriented to person, place, and time. She has normal strength.  Skin: Skin is warm and dry. She is not diaphoretic. No erythema.  Psychiatric: She has a normal mood and affect. Her behavior is normal.    ED Course  Procedures (including critical care time) Labs Review Labs Reviewed  BASIC METABOLIC PANEL - Abnormal; Notable for the following:    Sodium 134 (*)    All other components within normal limits  PRO B NATRIURETIC PEPTIDE  CBC  POCT I-STAT TROPONIN I   Imaging Review Dg Chest 2 View  04/25/2013   CLINICAL DATA:  Asthma and  chest pain. Cough. Shortness of breath and tachycardia.  EXAM: CHEST  2 VIEW  COMPARISON:  01/18/2013  FINDINGS: The cardiomediastinal silhouette is within normal limits. Lungs remain mildly hypoinflated. There is no evidence of airspace consolidation, edema, pleural effusion, or pneumothorax. No acute osseous abnormality is identified.  IMPRESSION: No evidence  of acute airspace disease.   Electronically Signed   By: Logan Bores   On: 04/25/2013 17:04    EKG Interpretation    Date/Time:  Tuesday April 25 2013 15:40:56 EST Ventricular Rate:  66 PR Interval:  162 QRS Duration: 82 QT Interval:  402 QTC Calculation: 421 R Axis:   26 Text Interpretation:  Normal sinus rhythm No significant change since last tracing Confirmed by WARD  DO, KRISTEN ([id number]) on 04/25/2013 6:32:59 PM            MDM   1. Asthma   2. Bronchitis    Pt presents with bronchitis which I believe likely exacerbated her asthma. Lung exam improved after duoneb and additional albuterol. Pt states she is at breathing baseline. Oxygen saturation at 97% on RA prior to discharge. Will d/c with a z pack and prednisone burst. Pt understands she needs to follow up with PCP. Return instructions given. Vital signs stable for discharge. Discussed case with Dr. Leonides Schanz who agrees with plan. Patient / Family / Caregiver informed of clinical course, understand medical decision-making process, and agree with plan.      Elwyn Lade, PA-C 04/25/13 2055

## 2013-04-25 NOTE — ED Notes (Signed)
Reports hx of asthma and having sob, nonproductive cough, chest congestion and occ sharp chest pains, ekg done at triage, airway intact.

## 2013-05-15 ENCOUNTER — Other Ambulatory Visit: Payer: Self-pay | Admitting: Family Medicine

## 2013-05-22 ENCOUNTER — Encounter: Payer: Self-pay | Admitting: Family Medicine

## 2013-05-22 ENCOUNTER — Ambulatory Visit (INDEPENDENT_AMBULATORY_CARE_PROVIDER_SITE_OTHER): Payer: 59 | Admitting: Family Medicine

## 2013-05-22 VITALS — BP 128/78 | HR 76 | Ht 66.0 in | Wt 244.0 lb

## 2013-05-22 DIAGNOSIS — F329 Major depressive disorder, single episode, unspecified: Secondary | ICD-10-CM

## 2013-05-22 DIAGNOSIS — K029 Dental caries, unspecified: Secondary | ICD-10-CM

## 2013-05-22 DIAGNOSIS — G43909 Migraine, unspecified, not intractable, without status migrainosus: Secondary | ICD-10-CM

## 2013-05-22 DIAGNOSIS — F3289 Other specified depressive episodes: Secondary | ICD-10-CM

## 2013-05-22 NOTE — Assessment & Plan Note (Signed)
A: Has hx of migraines but endorsing what more seems to be tension headaches assc with current stressors P: will cont to monitor with resolution or at least improvement of social situation and need for medication management

## 2013-05-22 NOTE — Assessment & Plan Note (Signed)
A: phq-9, endorsing hopelessness but denies hi/si; largely related to financial issues and issues surrounding being a single parent P: given name of social worker, also provided information for outpt psychotherapy f/up prn should pt desire

## 2013-05-22 NOTE — Patient Instructions (Signed)
Ms Dorsainvil, it was great to meet you! I have cleared you for dental work should you require any invasive procedures.  I am providing a copy of the letter for you to give to your dentist.  I am also happy to forward your information to Elana Alm (the social worker) should you require any additional assistance with cost management. The following are resources for counseling should you be interested:  I will see you back in 3 months or sooner as needed Bernadene Bell MD  [facility] (Crisis Line) [phone])  [facility] (Crisis Line) [phone]  [facility] [address], [place] [address] [phone]  [facility] (Spanish Speaking Therapist Available) [place] [address] [phone]  [facility] ([place] Speaking Therapist Available) [place] [address] [phone] HOTLINE: [place], [place] [phone]

## 2013-05-22 NOTE — Progress Notes (Signed)
Patient ID: Holly Mueller, female   DOB: [birth year], 48 y.o.   MRN: [id number] [facility] Bernadene Bell, MD Phone: [phone]  Subjective:   # Dental clearance -Originally had scheduled apptmt with oral surgeon in July but then noted she had to pay $5000 up front prior to intervention and thus cancelled appointment; pt now coming in for clearance for upcoming dental apptmt in event that they have to do more invasive procedures -has hx of asthma, well controlled on albuterol and symicort; had recent asthma exacerbation assc with cold weather and bronchitis prompting ED visit but otherwise uses rescue inhaler < 3 times per week -also with hx of epilepsy but has not had a seizure since 1970s. Appears to have had childhood epilepsy as is not on medications and has not had event or followed up with neuro since that time -no cardiac hx, denies SOB, chest pain or palps  #Headache -endorsing bitemporal or bioccipital headache for the last several days assc with the fact that she has no money to heat her home; not relieved by tylenol or ibuprofen -no assc phonophobia or photophobia, denies n/v  #Depression/social -phq- 9 points for hopelessness on most days. Endorses to me that she often goes to the bathroom to cry at work or screams in her bedroom at home out of frustration, feels that she has okay coping mechanisms at this time in terms of talking to friends or family -does occasionally speak with her son's therapist but has never had formal psych therapy before -also as single parent has multiple issues affording every day life including paying for kerasin to keep the house warm. 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 States that she only has $20 to heat her home for the next week. Has two teenaged children at home who only minimally help her with chores, feels very overwhelmed. Has allegedly worked with a Education officer, museum previously? Who told her to go to [employer]. This is unclear  All systems were reviewed and were negative unless otherwise noted in the HPI  Past Medical History Patient Active Problem List   Diagnosis Date Noted  . Ganglion cyst of wrist 08/24/2012  . Hip pain, bilateral 08/23/2012  . Cirrhosis of liver without mention of alcohol 07/27/2012  . Umbilical hernia 41/07/129  . Healthcare maintenance 02/14/2012  . Osteoarthritis 02/12/2012  . Elevated LFTs 10/30/2011  . Allergic rhinitis 08/26/2011  . Obesity 07/08/2010  . SEIZURES, HX OF 12/12/2009  . DENTAL CARIES 12/18/2008  . DEPRESSION 07/26/2008  . MIGRAINE HEADACHE 11/15/2007  . HYPOTHYROIDISM NOS 10/12/2006  . ASTHMA, EXTRINSIC NOS 07/30/2006   Reviewed problem list.  Medications- reviewed and updated Chief complaint-noted No additions to family history Social history- patient is a never smoker  Objective: BP 128/78  Pulse 76  Ht _0  (1.676 m)  Wt 244 lb (110.678 kg)  BMI 39.40 kg/m2 Gen: NAD, alert, cooperative with exam HEENT: NCAT, EOMI, PERRL, TMs nml; poor dental hygiene Neck: FROM, supple CV: RRR, good S1/S2, no murmur, cap refill <3 Resp: CTABL, no wheezes, non-labored Abd: SNTND, BS present, no guarding or organomegaly Ext: No edema, warm, normal tone, moves UE/LE spontaneously Neuro: Alert and oriented, No gross deficits Skin: no rashes no lesions  Assessment/Plan: See problem based a/p

## 2013-05-22 NOTE — Assessment & Plan Note (Signed)
A: here for pre-op dental clearance, no barriers to dental intervention at this time, asthma well controlled, no seizures since 1970s P: provided letter to give to dentist; hopeful that dental intervention will happen soon as teeth continuing to decay

## 2013-07-13 ENCOUNTER — Ambulatory Visit: Payer: 59 | Admitting: Family Medicine

## 2013-07-13 ENCOUNTER — Encounter (HOSPITAL_COMMUNITY): Payer: Self-pay | Admitting: Emergency Medicine

## 2013-07-13 ENCOUNTER — Emergency Department (HOSPITAL_COMMUNITY)
Admission: EM | Admit: 2013-07-13 | Discharge: 2013-07-13 | Disposition: A | Discharge status: Home / Self Care | Payer: 59 | Attending: Emergency Medicine | Admitting: Emergency Medicine

## 2013-07-13 ENCOUNTER — Emergency Department (HOSPITAL_COMMUNITY): Payer: 59

## 2013-07-13 DIAGNOSIS — R51 Headache: Secondary | ICD-10-CM | POA: Insufficient documentation

## 2013-07-13 DIAGNOSIS — Z79899 Other long term (current) drug therapy: Secondary | ICD-10-CM | POA: Insufficient documentation

## 2013-07-13 DIAGNOSIS — Z792 Long term (current) use of antibiotics: Secondary | ICD-10-CM | POA: Insufficient documentation

## 2013-07-13 DIAGNOSIS — K219 Gastro-esophageal reflux disease without esophagitis: Secondary | ICD-10-CM | POA: Insufficient documentation

## 2013-07-13 DIAGNOSIS — Z791 Long term (current) use of non-steroidal anti-inflammatories (NSAID): Secondary | ICD-10-CM | POA: Insufficient documentation

## 2013-07-13 DIAGNOSIS — IMO0002 Reserved for concepts with insufficient information to code with codable children: Secondary | ICD-10-CM | POA: Insufficient documentation

## 2013-07-13 DIAGNOSIS — E039 Hypothyroidism, unspecified: Secondary | ICD-10-CM | POA: Insufficient documentation

## 2013-07-13 DIAGNOSIS — J45909 Unspecified asthma, uncomplicated: Secondary | ICD-10-CM

## 2013-07-13 DIAGNOSIS — M129 Arthropathy, unspecified: Secondary | ICD-10-CM | POA: Insufficient documentation

## 2013-07-13 DIAGNOSIS — Z8669 Personal history of other diseases of the nervous system and sense organs: Secondary | ICD-10-CM | POA: Insufficient documentation

## 2013-07-13 DIAGNOSIS — J45901 Unspecified asthma with (acute) exacerbation: Secondary | ICD-10-CM | POA: Insufficient documentation

## 2013-07-13 LAB — BASIC METABOLIC PANEL
BUN: 12 mg/dL (ref 6–23)
CHLORIDE: 103 meq/L (ref 96–112)
CO2: 21 mEq/L (ref 19–32)
Calcium: 8.9 mg/dL (ref 8.4–10.5)
Creatinine, Ser: 0.61 mg/dL (ref 0.50–1.10)
GFR calc non Af Amer: >90 mL/min (ref >90)
Glucose, Bld: 97 mg/dL (ref 70–99)
Potassium: 3.9 mEq/L (ref 3.7–5.3)
Sodium: 136 mEq/L — ABNORMAL LOW (ref 137–147)

## 2013-07-13 LAB — CBC
HEMATOCRIT: 34.7 % — AB (ref 36.0–46.0)
Hemoglobin: 12.1 g/dL (ref 12.0–15.0)
MCH: 30 pg (ref 26.0–34.0)
MCHC: 34.9 g/dL (ref 30.0–36.0)
MCV: 86.1 fL (ref 78.0–100.0)
Platelets: 115 10*3/uL — ABNORMAL LOW (ref 150–400)
RBC: 4.03 MIL/uL (ref 3.87–5.11)
RDW: 13.3 % (ref 11.5–15.5)
WBC: 3.2 10*3/uL — AB (ref 4.0–10.5)

## 2013-07-13 LAB — I-STAT TROPONIN, ED: Troponin i, poc: 0 ng/mL (ref 0.00–0.08)

## 2013-07-13 MED ORDER — IPRATROPIUM BROMIDE 0.02 % IN SOLN
0.5000 mg | Freq: Once | RESPIRATORY_TRACT | Status: AC
  Administered 2013-07-13: 0.5 mg via RESPIRATORY_TRACT
  Filled 2013-07-13: qty 2.5

## 2013-07-13 MED ORDER — PROMETHAZINE-DM 6.25-15 MG/5ML PO SYRP: 5.0000 mL | ORAL_SOLUTION | Freq: Four times a day (QID) | ORAL | Status: DC | PRN

## 2013-07-13 MED ORDER — ALBUTEROL SULFATE (2.5 MG/3ML) 0.083% IN NEBU
5.0000 mg | INHALATION_SOLUTION | Freq: Once | RESPIRATORY_TRACT | Status: AC
  Administered 2013-07-13: 5 mg via RESPIRATORY_TRACT
  Filled 2013-07-13: qty 6

## 2013-07-13 MED ORDER — PREDNISONE 50 MG PO TABS: 50.0000 mg | ORAL_TABLET | Freq: Every day | ORAL | Status: DC

## 2013-07-13 MED ORDER — PREDNISONE 20 MG PO TABS
60.0000 mg | ORAL_TABLET | Freq: Once | ORAL | Status: AC
Start: 2013-07-13 — End: 2013-07-13
  Administered 2013-07-13: 60 mg via ORAL
  Filled 2013-07-13: qty 3

## 2013-07-13 MED ORDER — GUAIFENESIN ER 1200 MG PO TB12: 1.0000 | ORAL_TABLET | Freq: Two times a day (BID) | ORAL | Status: DC

## 2013-07-13 NOTE — Discharge Instructions (Signed)
Return here as needed.  Followup with your primary care doctor your testing here today was normal

## 2013-07-13 NOTE — ED Provider Notes (Signed)
CSN: [id number]     Arrival date & time 07/13/13  1202 History   First MD Initiated Contact with Patient 07/13/13 1523     Chief Complaint  Patient presents with  . Shortness of Breath  . Weakness     (Consider location/radiation/quality/duration/timing/severity/associated sxs/prior Treatment) HPI Patient presents to the emergency department with exacerbation of her asthma.  The patient, states, that she's had multiple episodes of worsening of her asthma, and she's had cough.  The patient, states, that this started Sunday.  Patient denies chest pain, nausea, vomiting, diarrhea, headache, blurred vision, weakness, dizziness, fever, back pain, dysuria, abdominal pain, or syncope.  Patient, states she's been using her albuterol nebulized treatments, but no other medications prior to arrival.  Patient denies anything makes her condition, better or worse. Past Medical History  Diagnosis Date  . Asthma 2004  . GERD (gastroesophageal reflux disease) 2008  . Epilepsy in 1972    No seizures since 1972. Previously treated with phenobarbital.   . Hypothyroidism 2008  . Seasonal allergies 2003  . Arthritis   . Headache(784.0)     hx of migraines    Past Surgical History  Procedure Laterality Date  . Novasure ablation    . C section      x2  . R shoulder scope  2011  . Cesarean section      x2  . Wisdom tooth extraction    . Ventral hernia repair N/A 06/30/2012    Procedure: LAPAROSCOPIC VENTRAL HERNIA;  Surgeon: Shann Medal, MD;  Location: [facility];  Service: General;  Laterality: N/A;  With Mesh  . Umbilical hernia repair N/A 06/30/2012    Procedure: remove umbilicus;  Surgeon: Shann Medal, MD;  Location: [facility];  Service: General;  Laterality: N/A;  . Liver biopsy  06/30/2012    Procedure: LIVER BIOPSY;  Surgeon: Shann Medal, MD;  Location: [facility];  Service: General;;   Family History  Problem Relation Age of Onset  . Hypertension Mother   . Diabetes Mother   . Lung cancer  Father   . Stroke Father   . Diabetes Sister   . Hypertension Sister    History  Substance Use Topics  . Smoking status: Never Smoker   . Smokeless tobacco: Never Used  . Alcohol Use: 0.0 oz/week     Comment: special occasions.    OB History   Grav Para Term Preterm Abortions TAB SAB Ect Mult Living                 Review of Systems  All other systems negative except as documented in the HPI. All pertinent positives and negatives as reviewed in the HPI.  Allergies  Aspirin  Home Medications   Current Outpatient Rx  Name  Route  Sig  Dispense  Refill  . albuterol (PROVENTIL HFA;VENTOLIN HFA) 108 (90 BASE) MCG/ACT inhaler   Inhalation   Inhale 2 puffs into the lungs every 6 (six) hours as needed. Shortness of breath         . albuterol (PROVENTIL) (2.5 MG/3ML) 0.083% nebulizer solution   Nebulization   Take 3 mLs (2.5 mg total) by nebulization every 6 (six) hours as needed for wheezing.   150 mL   1   . azithromycin (ZITHROMAX Z-PAK) 250 MG tablet      2 po day one, then 1 daily x 4 days   6 tablet   0   . benzonatate (TESSALON) 100 MG capsule  Oral   Take 2 capsules (200 mg total) by mouth 2 (two) times daily as needed for cough.   20 capsule   0   . budesonide-formoterol (SYMBICORT) 160-4.5 MCG/ACT inhaler   Inhalation   Inhale 2 puffs into the lungs 2 (two) times daily.         . cetirizine (ZYRTEC) 10 MG tablet   Oral   Take 10 mg by mouth daily.         . fluticasone (FLONASE) 50 MCG/ACT nasal spray   Nasal   Place 2 sprays into the nose daily.         Marland Kitchen ibuprofen (ADVIL,MOTRIN) 200 MG tablet   Oral   Take 400 mg by mouth every 6 (six) hours as needed for pain.         Marland Kitchen levothyroxine (SYNTHROID, LEVOTHROID) 300 MCG tablet   Oral   Take 300 mcg by mouth daily before breakfast.         . levothyroxine (SYNTHROID, LEVOTHROID) 300 MCG tablet      TAKE 1 TABLET BY MOUTH ONCE DAILY   90 tablet   4   . meloxicam (MOBIC) 15 MG  tablet   Oral   Take 15 mg by mouth daily.         . meloxicam (MOBIC) 15 MG tablet      TAKE 1 TABLET BY MOUTH DAILY.   30 tablet   3   . Naproxen Sodium (ALEVE PO)   Oral   Take 2 tablets by mouth 2 (two) times daily as needed (hip pain).         . pantoprazole (PROTONIX) 40 MG tablet   Oral   Take 40 mg by mouth daily.         . predniSONE (DELTASONE) 20 MG tablet      3 tabs po day one, then 2 tabs daily x 4 days   11 tablet   0    BP 118/75  Pulse 71  Temp(Src) 98.3 F (36.8 C)  Resp 18  SpO2 95% Physical Exam  Nursing note and vitals reviewed. Constitutional: She is oriented to person, place, and time. She appears well-developed and well-nourished.  HENT:  Head: Normocephalic and atraumatic.  Mouth/Throat: Oropharynx is clear and moist.  Eyes: Pupils are equal, round, and reactive to light.  Neck: Normal range of motion. Neck supple.  Cardiovascular: Normal rate, regular rhythm and normal heart sounds.  Exam reveals no gallop and no friction rub.   No murmur heard. Pulmonary/Chest: Effort normal and breath sounds normal. No respiratory distress. She has no wheezes.  Musculoskeletal: She exhibits no edema.  Neurological: She is alert and oriented to person, place, and time. She exhibits normal muscle tone. Coordination normal.  Skin: Skin is warm and dry. No rash noted. No erythema.    ED Course  Procedures (including critical care time) Labs Review Labs Reviewed  CBC - Abnormal; Notable for the following:    WBC 3.2 (*)    HCT 34.7 (*)    Platelets 115 (*)    All other components within normal limits  BASIC METABOLIC PANEL - Abnormal; Notable for the following:    Sodium 136 (*)    All other components within normal limits  I-STAT TROPOININ, ED   Imaging Review Dg Chest 2 View  07/13/2013   CLINICAL DATA:  Short of breath.  Weakness  EXAM: CHEST  2 VIEW  COMPARISON:  04/25/2013  FINDINGS: Mild left lower lobe atelectasis  is slightly improved.  Right lung is clear. Negative for heart failure or effusion. No evidence of pneumonia.  IMPRESSION: Left lower lobe atelectasis shows mild improvement. No new findings.   Electronically Signed   By: Franchot Gallo M.D.   On: 07/13/2013 14:09   Patient be given and was breathing treatment.  Fractures were reviewed and is no acute findings.  Patient is advised the plan and all questions were answered.  Will reassess the patient after a breathing treatment.   Patient does not have any wheezing noted.  On, exam.  She'll be referred back to her primary care Dr. told to return here as needed.  Brent General, PA-C 07/13/13 [phone]

## 2013-07-13 NOTE — ED Notes (Signed)
Per pt sts cough, SOB x 1 week. sts cold chills and fever. Denies pain. sts chest feels uncomfortable.

## 2013-07-13 NOTE — ED Provider Notes (Signed)
Medical screening examination/treatment/procedure(s) were performed by non-physician practitioner and as supervising physician I was immediately available for consultation/collaboration.   EKG Interpretation None        [place], DO 07/13/13 2348

## 2013-08-08 ENCOUNTER — Encounter: Payer: Self-pay | Admitting: Family Medicine

## 2013-08-08 ENCOUNTER — Ambulatory Visit (INDEPENDENT_AMBULATORY_CARE_PROVIDER_SITE_OTHER): Payer: 59 | Admitting: Family Medicine

## 2013-08-08 VITALS — BP 126/84 | HR 83 | Temp 97.5°F | Ht 66.0 in | Wt 236.0 lb

## 2013-08-08 DIAGNOSIS — J45909 Unspecified asthma, uncomplicated: Secondary | ICD-10-CM

## 2013-08-08 DIAGNOSIS — J45901 Unspecified asthma with (acute) exacerbation: Secondary | ICD-10-CM

## 2013-08-08 MED ORDER — IPRATROPIUM BROMIDE 0.02 % IN SOLN
0.5000 mg | Freq: Once | RESPIRATORY_TRACT | Status: AC
  Administered 2013-08-08: 0.5 mg via RESPIRATORY_TRACT

## 2013-08-08 MED ORDER — PREDNISONE 50 MG PO TABS: ORAL_TABLET | ORAL | Status: DC

## 2013-08-08 MED ORDER — ALBUTEROL SULFATE (2.5 MG/3ML) 0.083% IN NEBU
2.5000 mg | INHALATION_SOLUTION | Freq: Once | RESPIRATORY_TRACT | Status: AC
  Administered 2013-08-08: 2.5 mg via RESPIRATORY_TRACT

## 2013-08-08 MED ORDER — BENZONATATE 100 MG PO CAPS: 100.0000 mg | ORAL_CAPSULE | Freq: Two times a day (BID) | ORAL | Status: DC | PRN

## 2013-08-08 NOTE — Patient Instructions (Signed)
We are going to treat you as an asthma exacerbation with steroids and increasing the albuterol to every 4hrs for the next 2 days.  We will also check a chest xray.  Asthma, Adult Asthma is a recurring condition in which the airways tighten and narrow. Asthma can make it difficult to breathe. It can cause coughing, wheezing, and shortness of breath. Asthma episodes (also called asthma attacks) range from minor to life-threatening. Asthma cannot be cured, but medicines and lifestyle changes can help control it. CAUSES Asthma is believed to be caused by inherited (genetic) and environmental factors, but its exact cause is unknown. Asthma may be triggered by allergens, lung infections, or irritants in the air. Asthma triggers are different for each person. Common triggers include:   Animal dander.  Dust mites.  Cockroaches.  Pollen from trees or grass.  Mold.  Smoke.  Air pollutants such as dust, household cleaners, hair sprays, aerosol sprays, paint fumes, strong chemicals, or strong odors.  Cold air, weather changes, and winds (which increase molds and pollens in the air).  Strong emotional expressions such as crying or laughing hard.  Stress.  Certain medicines (such as aspirin) or types of drugs (such as beta-blockers).  Sulfites in foods and drinks. Foods and drinks that may contain sulfites include dried fruit, potato chips, and sparkling grape juice.  Infections or inflammatory conditions such as the flu, a cold, or an inflammation of the nasal membranes (rhinitis).  Gastroesophageal reflux disease (GERD).  Exercise or strenuous activity. SYMPTOMS Symptoms may occur immediately after asthma is triggered or many hours later. Symptoms include:  Wheezing.  Excessive nighttime or early morning coughing.  Frequent or severe coughing with a common cold.  Chest tightness.  Shortness of breath. DIAGNOSIS  The diagnosis of asthma is made by a review of your medical history  and a physical exam. Tests may also be performed. These may include:  Lung function studies. These tests show how much air you breath in and out.  Allergy tests.  Imaging tests such as X-rays. TREATMENT  Asthma cannot be cured, but it can usually be controlled. Treatment involves identifying and avoiding your asthma triggers. It also involves medicines. There are 2 classes of medicine used for asthma treatment:   Controller medicines. These prevent asthma symptoms from occurring. They are usually taken every day.  Reliever or rescue medicines. These quickly relieve asthma symptoms. They are used as needed and provide short-term relief. Your health care provider will help you create an asthma action plan. An asthma action plan is a written plan for managing and treating your asthma attacks. It includes a list of your asthma triggers and how they may be avoided. It also includes information on when medicines should be taken and when their dosage should be changed. An action plan may also involve the use of a device called a peak flow meter. A peak flow meter measures how well the lungs are working. It helps you monitor your condition. HOME CARE INSTRUCTIONS   Take medicine as directed by your health care provider. Speak with your health care provider if you have questions about how or when to take the medicines.  Use a peak flow meter as directed by your health care provider. Record and keep track of readings.  Understand and use the action plan to help minimize or stop an asthma attack without needing to seek medical care.  Control your home environment in the following ways to help prevent asthma attacks:  Do not smoke.  Avoid being exposed to secondhand smoke.  Change your heating and air conditioning filter regularly.  Limit your use of fireplaces and wood stoves.  Get rid of pests (such as roaches and mice) and their droppings.  Throw away plants if you see mold on them.  Clean  your floors and dust regularly. Use unscented cleaning products.  Try to have someone else vacuum for you regularly. Stay out of rooms while they are being vacuumed and for a short while afterward. If you vacuum, use a dust mask from a hardware store, a double-layered or microfilter vacuum cleaner bag, or a vacuum cleaner with a HEPA filter.  Replace carpet with wood, tile, or vinyl flooring. Carpet can trap dander and dust.  Use allergy-proof pillows, mattress covers, and box spring covers.  Wash bed sheets and blankets every week in hot water and dry them in a dryer.  Use blankets that are made of polyester or cotton.  Clean bathrooms and kitchens with bleach. If possible, have someone repaint the walls in these rooms with mold-resistant paint. Keep out of the rooms that are being cleaned and painted.  Wash hands frequently. SEEK MEDICAL CARE IF:   You have wheezing, shortness of breath, or a cough even if taking medicine to prevent attacks.  The colored mucus you cough up (sputum) is thicker than usual.  Your sputum changes from clear or white to yellow, green, gray, or bloody.  You have any problems that may be related to the medicines you are taking (such as a rash, itching, swelling, or trouble breathing).  You are using a reliever medicine more than 2 3 times per week.  Your peak flow is still at 50 79% of you personal best after following your action plan for 1 hour. SEEK IMMEDIATE MEDICAL CARE IF:   You seem to be getting worse and are unresponsive to treatment during an asthma attack.  You are short of breath even at rest.  You get short of breath when doing very little physical activity.  You have difficulty eating, drinking, or talking due to asthma symptoms.  You develop chest pain.  You develop a fast heartbeat.  You have a bluish color to your lips or fingernails.  You are lightheaded, dizzy, or faint.  Your peak flow is less than 50% of your personal  best.  You have a fever or persistent symptoms for more than 2 3 days.  You have a fever and symptoms suddenly get worse. MAKE SURE YOU:   Understand these instructions.  Will watch your condition.  Will get help right away if you are not doing well or get worse. Document Released: 04/27/2005 Document Revised: 12/28/2012 Document Reviewed: 11/24/2012 [facility] Patient Information 2014 [place], [place].

## 2013-08-08 NOTE — Assessment & Plan Note (Addendum)
Asthma exacerbation. Do not think that patient needs hospitalization for this but she does need a course of oral steroids and frequent albuterol every 4 hours. Will also obtain chest x-ray to rule out any pneumonia that may be contributing to her symptoms. The patient received DuoNeb treatment in the office with some improvement of symptoms We'll treat cough with Ladona Ridgel which has worked for patient in the past Reviewed red flags for ED visit such as worsening shortness of breath, chest pain  This is the patient's second asthma exacerbation in a month. This could be related to her allergies. She is currently on allergy pill and Flonase. She is also on maximum amount of Symbicort. Recommended that patient followup in one week at which point if her symptoms have resolved she may benefit from pulmonary function tests to better define her asthma.

## 2013-08-08 NOTE — Progress Notes (Signed)
Patient ID: LAKEITA PANTHER    DOB: [birth year], 48 y.o.   MRN: [id number] --- Subjective:  Makaela is a 48 y.o.female with history of asthma who presents with 5 days of increased shortness of breath with activity. Last 3 days have been worse. She needs a period of rest when she walks a few 100 feet. Shortness of breath is worse with activity, better with rest. She has had an associated productive cough. She denies any fevers or chills. She denies any chest pain. She denies any sick contacts. She has had some wheezing. She also has some nasal congestion, sneezing. No headache, no sinus pressure.    ROS: see HPI Past Medical History: reviewed and updated medications and allergies. Social History: Tobacco: Never smoker  Objective: Filed Vitals:   08/08/13 1412  BP: 126/84  Pulse: 83  Temp: 97.5 F (36.4 C)    Physical Examination:   General appearance - alert, well appearing, and in no distress Ears - bilateral TM's and external ear canals normal Nose - erythematous and congested nasal turbinates bilaterally Mouth - mucous membranes moist, pharynx normal without lesions Chest -normal work of breathing, in no respiratory distress, coarse breath sounds in bilateral bases, scattered wheezing present, normal air movement.  Heart - normal rate, regular rhythm, normal S1, S2, no murmurs   Patient received a DuoNeb treatment which did help her symptoms. Chest exam had slightly improved with decreased wheezing and normal air movement.

## 2013-08-16 ENCOUNTER — Ambulatory Visit (HOSPITAL_COMMUNITY)
Admission: RE | Admit: 2013-08-16 | Discharge: 2013-08-16 | Disposition: A | Discharge status: Home / Self Care | Payer: 59 | Source: Ambulatory Visit | Attending: Family Medicine | Admitting: Family Medicine

## 2013-08-16 ENCOUNTER — Telehealth: Payer: Self-pay | Admitting: Family Medicine

## 2013-08-16 DIAGNOSIS — R05 Cough: Secondary | ICD-10-CM | POA: Insufficient documentation

## 2013-08-16 DIAGNOSIS — R059 Cough, unspecified: Secondary | ICD-10-CM | POA: Insufficient documentation

## 2013-08-16 DIAGNOSIS — J45901 Unspecified asthma with (acute) exacerbation: Secondary | ICD-10-CM

## 2013-08-16 NOTE — Telephone Encounter (Signed)
Please let patient know that cxr was normal.  Thank you!  Liam Graham, PGY-3 Family Medicine Resident

## 2013-08-17 ENCOUNTER — Ambulatory Visit (INDEPENDENT_AMBULATORY_CARE_PROVIDER_SITE_OTHER): Payer: 59 | Admitting: Family Medicine

## 2013-08-17 ENCOUNTER — Ambulatory Visit (HOSPITAL_COMMUNITY)
Admission: RE | Admit: 2013-08-17 | Discharge: 2013-08-17 | Disposition: A | Discharge status: Home / Self Care | Payer: 59 | Source: Ambulatory Visit | Attending: Family Medicine | Admitting: Family Medicine

## 2013-08-17 ENCOUNTER — Encounter: Payer: Self-pay | Admitting: Family Medicine

## 2013-08-17 VITALS — BP 132/89 | HR 74 | Temp 98.3°F | Ht 66.0 in | Wt 238.0 lb

## 2013-08-17 DIAGNOSIS — R1310 Dysphagia, unspecified: Secondary | ICD-10-CM

## 2013-08-17 DIAGNOSIS — R0609 Other forms of dyspnea: Secondary | ICD-10-CM

## 2013-08-17 DIAGNOSIS — R0989 Other specified symptoms and signs involving the circulatory and respiratory systems: Secondary | ICD-10-CM

## 2013-08-17 DIAGNOSIS — J45909 Unspecified asthma, uncomplicated: Secondary | ICD-10-CM

## 2013-08-17 DIAGNOSIS — R0600 Dyspnea, unspecified: Secondary | ICD-10-CM

## 2013-08-17 MED ORDER — FLUTICASONE PROPIONATE 50 MCG/ACT NA SUSP: 2.0000 | Freq: Every day | NASAL | Status: DC

## 2013-08-17 MED ORDER — CETIRIZINE HCL 10 MG PO TABS: 10.0000 mg | ORAL_TABLET | Freq: Every day | ORAL | Status: DC

## 2013-08-17 MED ORDER — ALBUTEROL SULFATE HFA 108 (90 BASE) MCG/ACT IN AERS: 2.0000 | INHALATION_SPRAY | Freq: Four times a day (QID) | RESPIRATORY_TRACT | Status: DC | PRN

## 2013-08-17 MED ORDER — PREDNISONE 50 MG PO TABS: ORAL_TABLET | ORAL | Status: DC

## 2013-08-17 MED ORDER — BUDESONIDE-FORMOTEROL FUMARATE 160-4.5 MCG/ACT IN AERO: 2.0000 | INHALATION_SPRAY | Freq: Two times a day (BID) | RESPIRATORY_TRACT | Status: DC

## 2013-08-17 NOTE — Progress Notes (Signed)
Patient ID: Holly Mueller, female   DOB: [birth year], 48 y.o.   MRN: [id number] Subjective:   CC: Shortness of breath  HPI:   Patient is still short of breath. Last week she was given azithromycin and prednisone 5 day course, and this helped but asthma seems to be flaring up again. She takes symbicort 2 puffs BID, has been using albuterol daily for 2 weeks, and uses tessalon perles which help with cough. She denies chest pain/pressure, fevers, chills, productive cough, diaphoresis, dizziness, syncope, nausea, or vomiting. Dyspnea is random (not related to exertion). She is around perfume from customers at work and her roommate smokes indoors.  Review of Systems - Per HPI. Additionally, patient has had 2 months of feeling like solids are getting stuck in her throat.  PMH: - Recent asthma exacerbation.  SH: - Smoking status: Never smoker. Roommate smokes away from her (in her room or outside).    Objective:  Physical Exam BP 132/89  Pulse 74  Temp(Src) 98.3 F (36.8 C) (Oral)  Ht 5' 6" (1.676 m)  Wt 238 lb (107.956 kg)  BMI 38.43 kg/m2  SpO2 96% GEN: NAD, pleasant CV: RRR; no m/r/g; 1+ b radial pulse; No JVD PULM: Fine crackles throughout; No SOB HEENT: O/p clear with right tongue with 1/2cm plaque; No exudate or erythema  CXR 4/8: IMPRESSION:  No active cardiopulmonary disease.   EKG: NSR with sinus arrhythmia  Assessment:     Holly Mueller is a 48 y.o. female with h/o asthma here for persistent shortness of breath.    Plan:     # See problem list and after visit summary for problem-specific plans. - Of note, patient brings FMLA form she needs filled out.  Dyspnea - Asthma is most likely explanation though pt has already received one course of prednisone which helped. CXR was clear for acute disease and vitals stable in office. No wheezes but fine crackles throughout with PND and 2-3 pillow orthopnea, raises concern for CHF though weight stable. No increased work of  breathing in clinic. With habitus and PND, also consider sleep apnea. - EKG>>NSR - CXR yesterday with no acute cardiopulmonary disease but did notice nodularity. Would follow up in 4-6 weeks with another CXR or chest CT. - ProBNP>>WNL - Continue simbicort 2 puffs BID and use albuterol q4 hours x 1-2 days. - Prednisone for 5 more days. - Ask roommate to smoke outdoors. - Would benefit from PFTs and sleep study when over acute illness. - Return in 5 days for re-eval. Sooner return precautions reviewed.  Dysphagia - With solids. Present 2 months. - Return at next appt for evaluation.  Follow-up: Follow up in 5 days for re-evaluation of breathing.   Hilton Sinclair, MD [place]

## 2013-08-17 NOTE — Patient Instructions (Addendum)
I am sorry you feel unwell. Take prednisone for another 5 days. Continue taking symbicort.  Take albuterol every 4 hours for the next 1-2 days, then every 4 hours as needed. Your EKG is normal. We are getting some labs in clinic. I will call you if your labs are NOT normal. Your chest x-ray did not show signs of infection but did have some nodules that are probably lymph nodes, but I would like you to discuss with your PCP about following this up with further imaging in the future. Follow up with Dr Skeet Simmer in 5 days. Ask your roommate to smoke outdoors if possible.  Best,  Hilton Sinclair, MD

## 2013-08-17 NOTE — Telephone Encounter (Signed)
Called pt. LMVM to call back. Please see message. Mauricia Area

## 2013-08-18 DIAGNOSIS — Q393 Congenital stenosis and stricture of esophagus: Secondary | ICD-10-CM | POA: Insufficient documentation

## 2013-08-18 DIAGNOSIS — K222 Esophageal obstruction: Secondary | ICD-10-CM | POA: Insufficient documentation

## 2013-08-18 HISTORY — DX: Esophageal obstruction: K22.2

## 2013-08-18 HISTORY — DX: Congenital stenosis and stricture of esophagus: Q39.3

## 2013-08-18 LAB — PRO B NATRIURETIC PEPTIDE: Pro B Natriuretic peptide (BNP): 34.35 pg/mL (ref <126)

## 2013-08-18 NOTE — Assessment & Plan Note (Signed)
With solids. Present 2 months. - Return at next appt for evaluation.

## 2013-08-18 NOTE — Assessment & Plan Note (Addendum)
Asthma is most likely explanation though pt has already received one course of prednisone which helped. CXR was clear for acute disease and vitals stable in office. No wheezes but fine crackles throughout with PND and 2-3 pillow orthopnea, raises concern for CHF though weight stable and no extremity swelling. No increased work of breathing in clinic. With habitus and PND, also consider sleep apnea. - EKG>>NSR - CXR yesterday with no acute cardiopulmonary disease but did notice nodularity. Would follow up in 4-6 weeks with another CXR or chest CT given frequent respiratory issues over past month (x 3) thought to be asthma exacerbations. - ProBNP>>WNL - Continue simbicort 2 puffs BID and use albuterol q4 hours x 1-2 days. - Prednisone for 5 more days. - Ask roommate to smoke outdoors. - Would benefit from PFTs when over acute illness and sleep study. - Return in 5 days for re-eval. Sooner return precautions reviewed.

## 2013-08-22 ENCOUNTER — Encounter: Payer: Self-pay | Admitting: Family Medicine

## 2013-08-22 ENCOUNTER — Ambulatory Visit (INDEPENDENT_AMBULATORY_CARE_PROVIDER_SITE_OTHER): Payer: 59 | Admitting: Family Medicine

## 2013-08-22 ENCOUNTER — Telehealth: Payer: Self-pay | Admitting: *Deleted

## 2013-08-22 VITALS — BP 121/79 | HR 74 | Temp 98.1°F | Ht 66.0 in | Wt 242.0 lb

## 2013-08-22 DIAGNOSIS — R1310 Dysphagia, unspecified: Secondary | ICD-10-CM

## 2013-08-22 DIAGNOSIS — J45909 Unspecified asthma, uncomplicated: Secondary | ICD-10-CM

## 2013-08-22 MED ORDER — MONTELUKAST SODIUM 10 MG PO TABS: 10.0000 mg | ORAL_TABLET | Freq: Every day | ORAL | Status: DC

## 2013-08-22 MED ORDER — NYSTATIN 100000 UNIT/ML MT SUSP: 5.0000 mL | Freq: Four times a day (QID) | OROMUCOSAL | Status: DC

## 2013-08-22 NOTE — Telephone Encounter (Signed)
Holly Mueller. Holly Mueller.

## 2013-08-22 NOTE — Progress Notes (Signed)
Patient ID: Holly Mueller, female   DOB: 03/25/1966, 48 y.o.   MRN: [id number]    Subjective: HPI: Patient is a 48 y.o. female presenting to clinic today for follow up for asthma.  1. Asthma - She has been evaluated twice in the last 2 weeks. She has been on one round of antibiotics, and two rounds of prednisone. She is still using Symbicort daily, Zyrtec daily and using Albuterol every 6 hours prn wheezing and SOB. She is still sleeping with head elevated. She continues to cough intermittently. She states cough is triggered by roommate's smoking and strong perfumes.  2. Dysphagia - Two month history of feeling like food is stuck in her throat. The episodes pass on their own without cough, vomiting. She is eating soft foods now since her teeth have been pulled. She is able to drink liquids with no problems. Weight is stable. On PPI daily   History Reviewed: Never smoker, passive smoker  ROS: Please see HPI above.  Objective: Office vital signs reviewed. BP 121/79  Pulse 74  Temp(Src) 98.1 F (36.7 C) (Oral)  Ht _0  (1.676 m)  Wt 242 lb (109.77 kg)  BMI 39.08 kg/m2  SpO2 96%  Physical Examination:  General: Awake, alert. NAD HEENT: Atraumatic, normocephalic. Edentulous. MMM. Mild posterior pharynx erythema without exudate Neck: No masses palpated. No LAD Pulm: CTAB, no wheezes. Good effort with equal breath sounds in all lung fields Cardio: RRR, no murmurs appreciated Abdomen:+BS, soft, nontender, nondistended Extremities: No edema Neuro: Strength and sensation grossly intact  Assessment: 48 y.o. female follow up appointment  Plan: See Problem List and After Visit Summary

## 2013-08-22 NOTE — Patient Instructions (Signed)
I am glad you are feeling better. Try taking Singulair 86m daily rather than the Zyrtec.  For your swallowing we have a few options, and I am happy to start that today.  Amber M. Hairford, M.D.

## 2013-08-22 NOTE — Telephone Encounter (Signed)
Checked Dr. Allene Dillon box.  Form was completed, so I faxed to [facility] [phone]).  LMOVM on pt phone informing her. Laban Emperor Fleeger

## 2013-08-22 NOTE — Telephone Encounter (Signed)
Pt was in today for her followup appt.  She states that she gave FMLA papers to the MD who saw her.  Advised I would follow up on these.  Pt request that these be faxed to [facility] (her boss) when finished.  She needs them no later that Thursday.  Will forward to Dr. Dianah Field and Dr. Skeet Simmer. Laban Emperor Takeira Yanes

## 2013-08-22 NOTE — Assessment & Plan Note (Signed)
A: Difficulty swallowing soft foods. No weight loss. DDx includes candida from inhalers, GERD, esophageal stricture or motility issue. Patient already on appropriate PPI.   P; - Discussed options with patient to increase PPI, start Nystatin swish and swallow or send to GI for further work up. - Con't current PPI, add Nystatin. - F/u with PCP for GI referral if not improving with Nystatin.

## 2013-08-22 NOTE — Assessment & Plan Note (Signed)
A: Improved. No wheezing, con't to use Symbicort daily. S/p prednisone. I think there may be a large allergic component to her symptoms.  P: - Change Zyrtec to Singulair 64m qhs - Con't Symbicort and albuterol prn - No indication for antibiotics or steroids today - F/u prn with PCP

## 2013-08-22 NOTE — Telephone Encounter (Signed)
Thanks Janett Billow!! :)

## 2013-09-14 ENCOUNTER — Ambulatory Visit (INDEPENDENT_AMBULATORY_CARE_PROVIDER_SITE_OTHER): Payer: 59 | Admitting: Family Medicine

## 2013-09-14 ENCOUNTER — Encounter: Payer: Self-pay | Admitting: Family Medicine

## 2013-09-14 VITALS — BP 110/64 | HR 72 | Temp 98.1°F | Wt 240.0 lb

## 2013-09-14 DIAGNOSIS — M7072 Other bursitis of hip, left hip: Secondary | ICD-10-CM

## 2013-09-14 DIAGNOSIS — M76899 Other specified enthesopathies of unspecified lower limb, excluding foot: Secondary | ICD-10-CM

## 2013-09-14 DIAGNOSIS — R1310 Dysphagia, unspecified: Secondary | ICD-10-CM

## 2013-09-14 NOTE — Assessment & Plan Note (Signed)
A: Con't to have dysphagia despite PPI and Nystatin.  P: - Refer to GI for further work up and evaluation - No changes made in medications at this time.

## 2013-09-14 NOTE — Progress Notes (Signed)
Patient ID: Holly Mueller, female   DOB: 1965/09/22, 48 y.o.   MRN: [id number]    Subjective: HPI: Patient is a 48 y.o. female presenting to clinic today for dysphagia and arthritis.  1. Dysphagia- Still on PPI and Nystatin. No improvement. She states she feels like she is choking when she is eating and sometimes has to vomit to get it out. She has pain associated with the choking episodes. No dysphagia with liquids.  2. Arthritis- Patient reports left hip pain. States she had bursitis in the left side, radiates to the groin. States she is not able to sleep at night. She has tried mobic which does not help. She has tried heat to the area, and sleeping with pillow between the legs.   History Reviewed: Non smoker.  ROS: Please see HPI above.  Objective: Office vital signs reviewed. BP 110/64  Pulse 72  Temp(Src) 98.1 F (36.7 C) (Oral)  Wt 240 lb (108.863 kg)  Physical Examination:  General: Awake, alert. NAD HEENT: Atraumatic, normocephalic. MMM. Posterior pharynx wnl. Not drooling Neck: No masses palpated. No LAD Pulm: CTAB, no wheezes, good effort Cardio: RRR, no murmurs appreciated Abdomen: obese, soft, nontender, nondistended Extremities: No edema. Left hip- TTP over greater trochanter, ROM with full flexion and extension. Decreased external rotation due to pain.  Neuro: Strength and sensation grossly intact  Procedure: Left greater trochanteric bursa injection Consent signed and scanned into record. Medication:  4 cc Solumedrol  1 cc Lidocaine 1% without epi Preparation: area cleansed with alcohol Time Out taken  Injection  Landmarks identified 5 cc of medication injected into joint space using a later approach Patient tolerated well without bleeding or paresthesias  Patient had good range of motion of joint after injection   Assessment: 48 y.o. female dysphagia and bursitis  Plan: See Problem List and After Visit Summary

## 2013-09-14 NOTE — Patient Instructions (Signed)
Joint Injection Care After Refer to this sheet in the next few days. These instructions provide you with information on caring for yourself after you have had a joint injection. Your caregiver also may give you more specific instructions. Your treatment has been planned according to current medical practices, but problems sometimes occur. Call your caregiver if you have any problems or questions after your procedure. After any type of joint injection, it is not uncommon to experience:  Soreness, swelling, or bruising around the injection site.  Mild numbness, tingling, or weakness around the injection site caused by the numbing medicine used before or with the injection. It also is possible to experience the following effects associated with the specific agent after injection:  Iodine-based contrast agents:  Allergic reaction (itching, hives, widespread redness, and swelling beyond the injection site).  Corticosteroids (These effects are rare.):  Allergic reaction.  Increased blood sugar levels (If you have diabetes and you notice that your blood sugar levels have increased, notify your caregiver).  Increased blood pressure levels.  Mood swings.  Hyaluronic acid in the use of viscosupplementation.  Temporary heat or redness.  Temporary rash and itching.  Increased fluid accumulation in the injected joint. These effects all should resolve within a day after your procedure.  HOME CARE INSTRUCTIONS  Limit yourself to light activity the day of your procedure. Avoid lifting heavy objects, bending, stooping, or twisting.  Take prescription or over-the-counter pain medication as directed by your caregiver.  You may apply ice to your injection site to reduce pain and swelling the day of your procedure. Ice may be applied 03-04 times:  Put ice in a plastic bag.  Place a towel between your skin and the bag.  Leave the ice on for no longer than 15-20 minutes each time. SEEK  IMMEDIATE MEDICAL CARE IF:   Pain and swelling get worse rather than better or extend beyond the injection site.  Numbness does not go away.  Blood or fluid continues to leak from the injection site.  You have chest pain.  You have swelling of your face or tongue.  You have trouble breathing or you become dizzy.  You develop a fever, chills, or severe tenderness at the injection site that last longer than 1 day. MAKE SURE YOU:  Understand these instructions.  Watch your condition.  Get help right away if you are not doing well or if you get worse. Document Released: 01/08/2011 Document Revised: 07/20/2011 Document Reviewed: 01/08/2011 [facility] Patient Information 2014 [place].

## 2013-09-14 NOTE — Assessment & Plan Note (Signed)
A: Exam and history most consistent with bursitis. No red flags noted on exam. DDx includes arthritis (normal X-ray), referred back pain, or muscle strain  P: - Steroid injection performed at greater trochanter - Mobic daily - Ice prn - F/u with PCP as needed

## 2013-09-19 ENCOUNTER — Telehealth: Payer: Self-pay | Admitting: Family Medicine

## 2013-09-19 NOTE — Telephone Encounter (Signed)
Pt called and wanted to know the status of her FMLA papers. jw

## 2013-09-25 ENCOUNTER — Encounter: Payer: Self-pay | Admitting: Internal Medicine

## 2013-09-26 NOTE — Telephone Encounter (Signed)
Signed and faxed a while back. Excellent Rn Jazmin has already informed patient of this

## 2013-10-12 ENCOUNTER — Ambulatory Visit (INDEPENDENT_AMBULATORY_CARE_PROVIDER_SITE_OTHER): Payer: 59 | Admitting: Internal Medicine

## 2013-10-12 ENCOUNTER — Encounter: Payer: Self-pay | Admitting: Internal Medicine

## 2013-10-12 VITALS — BP 106/74 | HR 72 | Ht 65.75 in | Wt 242.4 lb

## 2013-10-12 DIAGNOSIS — R1310 Dysphagia, unspecified: Secondary | ICD-10-CM

## 2013-10-12 MED ORDER — OMEPRAZOLE-SODIUM BICARBONATE 40-1100 MG PO CAPS: 1.0000 | ORAL_CAPSULE | Freq: Every day | ORAL | Status: DC

## 2013-10-12 NOTE — Assessment & Plan Note (Signed)
Schedule EGD with dilation The risks and benefits as well as alternatives of endoscopic procedure(s) have been discussed and reviewed. All questions answered. The patient agrees to proceed.

## 2013-10-12 NOTE — Patient Instructions (Addendum)
Please pick up the omeprazole - bicarbonate prescription at the pharmacy - this treats your reflux.  You have been scheduled for an endoscopy. Please follow written instructions given to you at your visit today. If you use inhalers (even only as needed), please bring them with you on the day of your procedure. .  I appreciate the opportunity to care for you.  Patient given blank EGD instruction papers, to fill in date/times once we see when she can get off work.  We are hopeful that we can do this on Dr. Celesta Aver hospital week.

## 2013-10-13 ENCOUNTER — Other Ambulatory Visit: Payer: Self-pay

## 2013-10-13 ENCOUNTER — Telehealth: Payer: Self-pay | Admitting: Internal Medicine

## 2013-10-13 DIAGNOSIS — R1310 Dysphagia, unspecified: Secondary | ICD-10-CM

## 2013-10-13 NOTE — Telephone Encounter (Signed)
Informed patient about EGD/dil date and time to be done at [facility] on 10/24/13 at 1:00pm.  She verbalized understanding and will fill in date/times on her papers.

## 2013-10-13 NOTE — Progress Notes (Signed)
Subjective:    Patient ID: Holly Mueller, female    DOB: 30-Nov-1965, 48 y.o.   MRN: [id number]  HPI This is a very nice 48 year old white woman, cafeteria worker at [employer], was having intermittent solid food dysphagia for the past 2-3 months. She's been on pantoprazole for some time but feels like it stopped working and heartburn returned. She's even tried it twice a day but still having heartburn. The symptoms of dysphagia did begin around the time of dental extraction, in March of this year. She is waiting to have enough money to purchase dentures. She has not had weight loss or bleeding. She has not had problems like this before that she has had chronic heartburn issues. Allergies  Allergen Reactions  . Aspirin Nausea Only    Upset stomach   Outpatient Prescriptions Prior to Visit  Medication Sig Dispense Refill  . albuterol (PROVENTIL HFA;VENTOLIN HFA) 108 (90 BASE) MCG/ACT inhaler Inhale 2 puffs into the lungs every 6 (six) hours as needed. Shortness of breath  1 Inhaler  0  . albuterol (PROVENTIL) (2.5 MG/3ML) 0.083% nebulizer solution Take 3 mLs (2.5 mg total) by nebulization every 6 (six) hours as needed for wheezing.  150 mL  1  . benzonatate (TESSALON) 100 MG capsule Take 1-2 capsules (100-200 mg total) by mouth 2 (two) times daily as needed for cough.  45 capsule  0  . budesonide-formoterol (SYMBICORT) 160-4.5 MCG/ACT inhaler Inhale 2 puffs into the lungs 2 (two) times daily.  1 Inhaler  2  . fluticasone (FLONASE) 50 MCG/ACT nasal spray Place 2 sprays into both nostrils daily.  16 g  2  . levothyroxine (SYNTHROID, LEVOTHROID) 300 MCG tablet Take 300 mcg by mouth daily before breakfast.      . Naproxen Sodium (ALEVE PO) Take 2 tablets by mouth 2 (two) times daily as needed (hip pain).      Marland Kitchen azithromycin (ZITHROMAX) 250 MG tablet Take 250 mg by mouth See admin instructions. 2 po day one, then 1 daily x 4 days. Finished over a month ago      . Guaifenesin 1200 MG TB12  Take 1 tablet (1,200 mg total) by mouth 2 (two) times daily.  20 each  0  . ibuprofen (ADVIL,MOTRIN) 200 MG tablet Take 400 mg by mouth every 6 (six) hours as needed for pain.      . meloxicam (MOBIC) 15 MG tablet Take 15 mg by mouth daily.      . montelukast (SINGULAIR) 10 MG tablet Take 1 tablet (10 mg total) by mouth at bedtime.  30 tablet  3  . nystatin (MYCOSTATIN) 100000 UNIT/ML suspension Take 5 mLs (500,000 Units total) by mouth 4 (four) times daily.  240 mL  0  . pantoprazole (PROTONIX) 40 MG tablet Take 40 mg by mouth daily.      . predniSONE (DELTASONE) 50 MG tablet Take 36m daily for 5 days.  5 tablet  0  . promethazine-dextromethorphan (PROMETHAZINE-DM) 6.25-15 MG/5ML syrup Take 5 mLs by mouth 4 (four) times daily as needed for cough.  120 mL  0   No facility-administered medications prior to visit.   Past Medical History  Diagnosis Date  . Asthma 2004  . GERD (gastroesophageal reflux disease) 2008  . Epilepsy in 1972    No seizures since 1972. Previously treated with phenobarbital.   . Hypothyroidism 2008  . Seasonal allergies 2003  . Arthritis   . Headache(784.0)  hx of migraines    Past Surgical History  Procedure Laterality Date  . Novasure ablation    . Shoulder arthroscopy Right 2011  . Cesarean section      x2  . Wisdom tooth extraction    . Ventral hernia repair N/A 06/30/2012    Procedure: LAPAROSCOPIC VENTRAL HERNIA;  Surgeon: Shann Medal, MD;  Location: [facility];  Service: General;  Laterality: N/A;  With Mesh  . Umbilical hernia repair N/A 06/30/2012    Procedure: remove umbilicus;  Surgeon: Shann Medal, MD;  Location: [facility];  Service: General;  Laterality: N/A;  . Liver biopsy  06/30/2012    Procedure: LIVER BIOPSY;  Surgeon: Shann Medal, MD;  Location: [facility];  Service: General;;  . Dental surgery     History   Social History  . Marital Status: Single    Spouse Name: N/A    Number of Children: 2  . Years of Education: N/A    Occupational History  .  [employer]   Social History Main Topics  . Smoking status: Never Smoker   . Smokeless tobacco: Never Used  . Alcohol Use: 0.0 oz/week     Comment: special occasions.   . Drug Use: No  . Sexual Activity: No   Other Topics Concern  . None   Social History Narrative   Works in Estate manager/land agent at [employer].    Lives with 12 and 14 (boys).    Also living with close friends of the family (mother and two kids, adults).          Family History  Problem Relation Age of Onset  . Hypertension Mother   . Diabetes Mother   . Lung cancer Father   . Stroke Father   . Diabetes Sister   . Hypertension Sister         Review of Systems Allergies, joint pains headaches back pain. All other review of systems negative.    Objective:   Physical Exam General:  Well-developed, well-nourished and in no acute distress Eyes:  anicteric. ENT:   Mouth and posterior pharynx free of lesions. She is edentulous. Neck:   supple w/o thyromegaly or mass.  Lungs: Clear to auscultation bilaterally. Heart:  S1S2, no rubs, murmurs, gallops. Abdomen:  soft, non-tender, no hepatosplenomegaly, or mass and BS+.  Lymph:  no cervical or supraclavicular adenopathy. Extremities:   no edema Skin   no rash. Neuro:  A&O x 3.  Psych:  appropriate mood and  Affect.      Assessment & Plan:  Dysphagia, unspecified(787.20) Schedule EGD with dilation The risks and benefits as well as alternatives of endoscopic procedure(s) have been discussed and reviewed. All questions answered. The patient agrees to proceed.    CC: Langston Masker, MD

## 2013-10-17 ENCOUNTER — Encounter (HOSPITAL_COMMUNITY): Payer: Self-pay | Admitting: Pharmacy Technician

## 2013-10-19 ENCOUNTER — Encounter (HOSPITAL_COMMUNITY): Payer: Self-pay | Admitting: *Deleted

## 2013-10-24 ENCOUNTER — Encounter (HOSPITAL_COMMUNITY): Payer: 59 | Admitting: Anesthesiology

## 2013-10-24 ENCOUNTER — Encounter (HOSPITAL_COMMUNITY)
Admission: RE | Disposition: A | Discharge status: Home / Self Care | Payer: Self-pay | Source: Ambulatory Visit | Attending: Internal Medicine

## 2013-10-24 ENCOUNTER — Encounter (HOSPITAL_COMMUNITY): Payer: Self-pay | Admitting: *Deleted

## 2013-10-24 ENCOUNTER — Ambulatory Visit (HOSPITAL_COMMUNITY): Payer: 59 | Admitting: Anesthesiology

## 2013-10-24 ENCOUNTER — Ambulatory Visit (HOSPITAL_COMMUNITY)
Admission: RE | Admit: 2013-10-24 | Discharge: 2013-10-24 | Disposition: A | Discharge status: Home / Self Care | Payer: 59 | Source: Ambulatory Visit | Attending: Internal Medicine | Admitting: Internal Medicine

## 2013-10-24 DIAGNOSIS — K222 Esophageal obstruction: Secondary | ICD-10-CM

## 2013-10-24 DIAGNOSIS — Q393 Congenital stenosis and stricture of esophagus: Secondary | ICD-10-CM

## 2013-10-24 DIAGNOSIS — Z886 Allergy status to analgesic agent status: Secondary | ICD-10-CM | POA: Insufficient documentation

## 2013-10-24 DIAGNOSIS — Z79899 Other long term (current) drug therapy: Secondary | ICD-10-CM | POA: Insufficient documentation

## 2013-10-24 DIAGNOSIS — R1310 Dysphagia, unspecified: Secondary | ICD-10-CM

## 2013-10-24 DIAGNOSIS — E039 Hypothyroidism, unspecified: Secondary | ICD-10-CM | POA: Insufficient documentation

## 2013-10-24 DIAGNOSIS — K2940 Chronic atrophic gastritis without bleeding: Secondary | ICD-10-CM | POA: Insufficient documentation

## 2013-10-24 DIAGNOSIS — K219 Gastro-esophageal reflux disease without esophagitis: Secondary | ICD-10-CM | POA: Insufficient documentation

## 2013-10-24 DIAGNOSIS — J45909 Unspecified asthma, uncomplicated: Secondary | ICD-10-CM | POA: Insufficient documentation

## 2013-10-24 DIAGNOSIS — Q391 Atresia of esophagus with tracheo-esophageal fistula: Secondary | ICD-10-CM | POA: Insufficient documentation

## 2013-10-24 HISTORY — DX: Congenital stenosis and stricture of esophagus: Q39.3

## 2013-10-24 HISTORY — DX: Anodontia: K00.0

## 2013-10-24 HISTORY — DX: Complete loss of teeth, unspecified cause, unspecified class: K08.109

## 2013-10-24 HISTORY — PX: BALLOON DILATION: SHX5330

## 2013-10-24 HISTORY — PX: ESOPHAGOGASTRODUODENOSCOPY: SHX5428

## 2013-10-24 HISTORY — DX: Shortness of breath: R06.02

## 2013-10-24 SURGERY — EGD (ESOPHAGOGASTRODUODENOSCOPY)
Anesthesia: Monitor Anesthesia Care

## 2013-10-24 MED ORDER — LACTATED RINGERS IV SOLN
INTRAVENOUS | Status: DC
  Administered 2013-10-24: 1000 mL via INTRAVENOUS

## 2013-10-24 MED ORDER — LIDOCAINE HCL 1 % IJ SOLN
INTRAMUSCULAR | Status: DC | PRN
  Administered 2013-10-24: 50 mg via INTRADERMAL

## 2013-10-24 MED ORDER — SODIUM CHLORIDE 0.9 % IV SOLN: INTRAVENOUS | Status: DC

## 2013-10-24 MED ORDER — PROPOFOL INFUSION 10 MG/ML OPTIME
INTRAVENOUS | Status: DC | PRN
  Administered 2013-10-24: 140 ug/kg/min via INTRAVENOUS

## 2013-10-24 MED ORDER — MIDAZOLAM HCL 2 MG/2ML IJ SOLN
INTRAMUSCULAR | Status: AC
  Filled 2013-10-24: qty 2

## 2013-10-24 MED ORDER — PROPOFOL 10 MG/ML IV BOLUS
INTRAVENOUS | Status: AC
  Filled 2013-10-24: qty 20

## 2013-10-24 MED ORDER — PROMETHAZINE HCL 25 MG/ML IJ SOLN: 6.2500 mg | INTRAMUSCULAR | Status: DC | PRN

## 2013-10-24 MED ORDER — LACTATED RINGERS IV SOLN: INTRAVENOUS | Status: DC

## 2013-10-24 MED ORDER — MIDAZOLAM HCL 5 MG/5ML IJ SOLN
INTRAMUSCULAR | Status: DC | PRN
  Administered 2013-10-24: 2 mg via INTRAVENOUS

## 2013-10-24 MED ORDER — BUTAMBEN-TETRACAINE-BENZOCAINE 2-2-14 % EX AERO
INHALATION_SPRAY | CUTANEOUS | Status: DC | PRN
  Administered 2013-10-24: 2 via TOPICAL

## 2013-10-24 NOTE — Op Note (Signed)
[facility] [place], [address]   ENDOSCOPY PROCEDURE REPORT  PATIENT: Holly Mueller, Holly Mueller  MR#: [id number] BIRTHDATE: Sep 10, 1965 , 47  yrs. old GENDER: Female ENDOSCOPIST: Gatha Mayer, MD, [facility] PROCEDURE DATE:  10/24/2013 PROCEDURE:  EGD w/ biopsy  + balloon dilation < 30 mm ASA CLASS:     Class III INDICATIONS:  Dysphagia. MEDICATIONS: See Anesthesia Report. TOPICAL ANESTHETIC: none  DESCRIPTION OF PROCEDURE: After the risks benefits and alternatives of the procedure were thoroughly explained, informed consent was obtained.  The Pentax Gastroscope [id number] endoscope was introduced through the mouth and advanced to the second portion of the duodenum. Without limitations.  The instrument was slowly withdrawn as the mucosa was fully examined.     ESOPHAGUS: A  ring was found in the lower third of the esophagus. It was dilated 15, 16.5 and 18 mm.  Eosinophilic esophagitis was suspected with mucosal changes that included longitudinal furrows and white spots were found in the entire esophagus.   Biopsies taken from entire esophagus and antrum.  The remainder of the upper endoscopy exam was otherwise normal. Retroflexed views revealed no abnormalities.     The scope was then withdrawn from the patient and the procedure completed.  COMPLICATIONS: There were no complications. ENDOSCOPIC IMPRESSION: 1.   Ring was found in the lower third of the esophagus - dilated to 19 mm by balloon 2.   Possible eosinophilic esophagitis changes were found in the entire esophagus - biopsied 3.   The remainder of the upper endoscopy exam was otherwise normal  RECOMMENDATIONS: 1.  Clear liquids until 3 PM , then soft foods rest of day.  Resume prior diet tomorrow. 2.  Office will call with results   eSigned:  Gatha Mayer, MD, [facility] 10/24/2013 1:36 PM   CC: Langston Masker, MD, The Patient

## 2013-10-24 NOTE — Discharge Instructions (Addendum)
There was a ring or narrow area in the esophagus. I dilated that (stretched it) and that should help you swallow better.  I also saw some changes in the lining which may mean you have another process due to allergies causing inflammation and swallowing problems - I took biopsies so we can look under the microscope to see if you have that problem. If so we will change your medication.  My office will call you by next week and set up next steps.   I appreciate the opportunity to care for you. Gatha Mayer, MD, FACG   YOU HAD AN ENDOSCOPIC PROCEDURE TODAY: Refer to the procedure report and other information in the discharge instructions given to you for any specific questions about what was found during the examination. If this information does not answer your questions, please call Dr. Celesta Aver office at [phone] to clarify.   YOU SHOULD EXPECT: Some feelings of bloating in the abdomen. Passage of more gas than usual. Walking can help get rid of the air that was put into your GI tract during the procedure and reduce the bloating. If you had a lower endoscopy (such as a colonoscopy or flexible sigmoidoscopy) you may notice spotting of blood in your stool or on the toilet paper. Some abdominal soreness may be present for a day or two, also.  DIET: Please start off with liquids only until 3 PM then soft foods today and then can try regular food tomorrow.Drink plenty of fluids but you should avoid alcoholic beverages for 24 hours.   ACTIVITY: Your care partner should take you home directly after the procedure. You should plan to take it easy, moving slowly for the rest of the day. You can resume normal activity the day after the procedure however YOU SHOULD NOT DRIVE, use power tools, machinery or perform tasks that involve climbing or major physical exertion for 24 hours (because of the sedation medicines used during the test).   SYMPTOMS TO REPORT IMMEDIATELY: A gastroenterologist can be  reached at any hour. Please call [phone]  for any of the following symptoms:   Following upper endoscopy (EGD, EUS, ERCP, esophageal dilation) Vomiting of blood or coffee ground material  New, significant abdominal pain  New, significant chest pain or pain under the shoulder blades  Painful or persistently difficult swallowing  New shortness of breath  Black, tarry-looking or red, bloody stools  FOLLOW UP:  If any biopsies were taken you will be contacted by phone or by letter within the next 1-3 weeks. Call [phone]  if you have not heard about the biopsies in 3 weeks.  Please also call with any specific questions about appointments or follow up tests.Esophagogastroduodenoscopy Care After Refer to this sheet in the next few weeks. These instructions provide you with information on caring for yourself after your procedure. Your caregiver may also give you more specific instructions. Your treatment has been planned according to current medical practices, but problems sometimes occur. Call your caregiver if you have any problems or questions after your procedure.  HOME CARE INSTRUCTIONS  Do not eat or drink anything until the numbing medicine (local anesthetic) has worn off and your gag reflex has returned. You will know that the local anesthetic has worn off when you can swallow comfortably.  Do not drive for 12 hours after the procedure or as directed by your caregiver.  Only take medicines as directed by your caregiver. SEEK MEDICAL CARE IF:   You cannot stop coughing.  You are  not urinating at all or less than usual. SEEK IMMEDIATE MEDICAL CARE IF:  You have difficulty swallowing.  You cannot eat or drink.  You have worsening throat or chest pain.  You have dizziness, lightheadedness, or you faint.  You have nausea or vomiting.  You have chills.  You have a fever.  You have severe abdominal pain.  You have black, tarry, or bloody stools. Document Released:  04/13/2012 Document Reviewed: 04/13/2012 [facility] Patient Information 2014 [place], [place].

## 2013-10-24 NOTE — Anesthesia Postprocedure Evaluation (Signed)
Anesthesia Post Note  Patient: Holly Mueller  Procedure(s) Performed: Procedure(s) (LRB): ESOPHAGOGASTRODUODENOSCOPY (EGD) (N/A) BALLOON DILATION (N/A)  Anesthesia type: MAC  Patient location: PACU  Post pain: Pain level controlled  Post assessment: Post-op Vital signs reviewed  Last Vitals:  Filed Vitals:   10/24/13 1400  BP: 130/88  Temp:   Resp: 14    Post vital signs: Reviewed  Level of consciousness: sedated  Complications: No apparent anesthesia complications

## 2013-10-24 NOTE — Transfer of Care (Signed)
Immediate Anesthesia Transfer of Care Note  Patient: Holly Mueller  Procedure(s) Performed: Procedure(s): ESOPHAGOGASTRODUODENOSCOPY (EGD) (N/A) BALLOON DILATION (N/A)  Patient Location: PACU and Endoscopy Unit  Anesthesia Type:MAC  Level of Consciousness: awake, alert , oriented and patient cooperative  Airway & Oxygen Therapy: Patient Spontanous Breathing and Patient connected to nasal cannula oxygen  Post-op Assessment: Report given to PACU RN, Post -op Vital signs reviewed and stable and Patient moving all extremities  Post vital signs: Reviewed and stable  Complications: No apparent anesthesia complications

## 2013-10-24 NOTE — Interval H&P Note (Signed)
History and Physical Interval Note:  10/24/2013 12:24 PM  Holly Mueller  has presented today for surgery, with the diagnosis of dysphagia  The various methods of treatment have been discussed with the patient and family. After consideration of risks, benefits and other options for treatment, the patient has consented to  Procedure(s): ESOPHAGOGASTRODUODENOSCOPY (EGD) (N/A) BALLOON DILATION (N/A) as a surgical intervention .  The patient's history has been reviewed, patient examined, no change in status, stable for surgery.  I have reviewed the patient's chart and labs.  Questions were answered to the patient's satisfaction.     Silvano Rusk

## 2013-10-24 NOTE — H&P (View-Only) (Signed)
Subjective:    Patient ID: Holly Mueller, female    DOB: 30-Nov-1965, 48 y.o.   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 She's been on pantoprazole for some time but feels like it stopped working and heartburn returned. She's even tried it twice a day but still having heartburn. The symptoms of dysphagia did begin around the time of dental extraction, in March of this year. She is waiting to have enough money to purchase dentures. She has not had weight loss or bleeding. She has not had problems like this before that she has had chronic heartburn issues. Allergies  Allergen Reactions  . Aspirin Nausea Only    Upset stomach   Outpatient Prescriptions Prior to Visit  Medication Sig Dispense Refill  . albuterol (PROVENTIL HFA;VENTOLIN HFA) 108 (90 BASE) MCG/ACT inhaler Inhale 2 puffs into the lungs every 6 (six) hours as needed. Shortness of breath  1 Inhaler  0  . albuterol (PROVENTIL) (2.5 MG/3ML) 0.083% nebulizer solution Take 3 mLs (2.5 mg total) by nebulization every 6 (six) hours as needed for wheezing.  150 mL  1  . benzonatate (TESSALON) 100 MG capsule Take 1-2 capsules (100-200 mg total) by mouth 2 (two) times daily as needed for cough.  45 capsule  0  . budesonide-formoterol (SYMBICORT) 160-4.5 MCG/ACT inhaler Inhale 2 puffs into the lungs 2 (two) times daily.  1 Inhaler  2  . fluticasone (FLONASE) 50 MCG/ACT nasal spray Place 2 sprays into both nostrils daily.  16 g  2  . levothyroxine (SYNTHROID, LEVOTHROID) 300 MCG tablet Take 300 mcg by mouth daily before breakfast.      . Naproxen Sodium (ALEVE PO) Take 2 tablets by mouth 2 (two) times daily as needed (hip pain).      Marland Kitchen azithromycin (ZITHROMAX) 250 MG tablet Take 250 mg by mouth See admin instructions. 2 po day one, then 1 daily x 4 days. Finished over a month ago      . Guaifenesin 1200 MG TB12  Take 1 tablet (1,200 mg total) by mouth 2 (two) times daily.  20 each  0  . ibuprofen (ADVIL,MOTRIN) 200 MG tablet Take 400 mg by mouth every 6 (six) hours as needed for pain.      . meloxicam (MOBIC) 15 MG tablet Take 15 mg by mouth daily.      . montelukast (SINGULAIR) 10 MG tablet Take 1 tablet (10 mg total) by mouth at bedtime.  30 tablet  3  . nystatin (MYCOSTATIN) 100000 UNIT/ML suspension Take 5 mLs (500,000 Units total) by mouth 4 (four) times daily.  240 mL  0  . pantoprazole (PROTONIX) 40 MG tablet Take 40 mg by mouth daily.      . predniSONE (DELTASONE) 50 MG tablet Take 36m daily for 5 days.  5 tablet  0  . promethazine-dextromethorphan (PROMETHAZINE-DM) 6.25-15 MG/5ML syrup Take 5 mLs by mouth 4 (four) times daily as needed for cough.  120 mL  0   No facility-administered medications prior to visit.   Past Medical History  Diagnosis Date  . Asthma 2004  . GERD (gastroesophageal reflux disease) 2008  . Epilepsy in 1972    No seizures since 1972. Previously treated with phenobarbital.   . Hypothyroidism 2008  . Seasonal allergies 2003  . Arthritis   . Headache(784.0)  hx of migraines    Past Surgical History  Procedure Laterality Date  . Novasure ablation    . Shoulder arthroscopy Right 2011  . Cesarean section      x2  . Wisdom tooth extraction    . Ventral hernia repair N/A 06/30/2012    Procedure: LAPAROSCOPIC VENTRAL HERNIA;  Surgeon: Shann Medal, MD;  Location: [facility];  Service: General;  Laterality: N/A;  With Mesh  . Umbilical hernia repair N/A 06/30/2012    Procedure: remove umbilicus;  Surgeon: Shann Medal, MD;  Location: [facility];  Service: General;  Laterality: N/A;  . Liver biopsy  06/30/2012    Procedure: LIVER BIOPSY;  Surgeon: Shann Medal, MD;  Location: [facility];  Service: General;;  . Dental surgery     History   Social History  . Marital Status: Single    Spouse Name: N/A    Number of Children: 2  . Years of Education: N/A    Occupational History  .  [employer]   Social History Main Topics  . Smoking status: Never Smoker   . Smokeless tobacco: Never Used  . Alcohol Use: 0.0 oz/week     Comment: special occasions.   . Drug Use: No  . Sexual Activity: No   Other Topics Concern  . None   Social History Narrative   Works in Estate manager/land agent at [employer].    Lives with 12 and 14 (boys).    Also living with close friends of the family (mother and two kids, adults).          Family History  Problem Relation Age of Onset  . Hypertension Mother   . Diabetes Mother   . Lung cancer Father   . Stroke Father   . Diabetes Sister   . Hypertension Sister         Review of Systems Allergies, joint pains headaches back pain. All other review of systems negative.    Objective:   Physical Exam General:  Well-developed, well-nourished and in no acute distress Eyes:  anicteric. ENT:   Mouth and posterior pharynx free of lesions. She is edentulous. Neck:   supple w/o thyromegaly or mass.  Lungs: Clear to auscultation bilaterally. Heart:  S1S2, no rubs, murmurs, gallops. Abdomen:  soft, non-tender, no hepatosplenomegaly, or mass and BS+.  Lymph:  no cervical or supraclavicular adenopathy. Extremities:   no edema Skin   no rash. Neuro:  A&O x 3.  Psych:  appropriate mood and  Affect.      Assessment & Plan:  Dysphagia, unspecified(787.20) Schedule EGD with dilation The risks and benefits as well as alternatives of endoscopic procedure(s) have been discussed and reviewed. All questions answered. The patient agrees to proceed.    CC: Langston Masker, MD

## 2013-10-24 NOTE — Anesthesia Preprocedure Evaluation (Addendum)
Anesthesia Evaluation  Patient identified by MRN, date of birth, ID band Patient awake    Reviewed: Allergy & Precautions, H&P , NPO status , Patient's Chart, lab work & pertinent test results  Airway Mallampati: II TM Distance: >3 FB Neck ROM: Full    Dental  (+) Loose, Dental Advisory Given, Missing, Poor Dentition, Chipped   Pulmonary shortness of breath, asthma ,  breath sounds clear to auscultation  Pulmonary exam normal       Cardiovascular negative cardio ROS  Rhythm:Regular Rate:Normal     Neuro/Psych  Headaches, Seizures -,  Depression negative neurological ROS  negative psych ROS   GI/Hepatic Neg liver ROS, GERD-  Medicated,  Endo/Other  Hypothyroidism Morbid obesity  Renal/GU negative Renal ROS  negative genitourinary   Musculoskeletal negative musculoskeletal ROS (+)   Abdominal   Peds  Hematology negative hematology ROS (+)   Anesthesia Other Findings Very poor dentition  Reproductive/Obstetrics negative OB ROS                           Anesthesia Physical Anesthesia Plan  ASA: III  Anesthesia Plan: MAC   Post-op Pain Management:    Induction: Intravenous  Airway Management Planned: Nasal Cannula  Additional Equipment:   Intra-op Plan:   Post-operative Plan:   Informed Consent: I have reviewed the patients History and Physical, chart, labs and discussed the procedure including the risks, benefits and alternatives for the proposed anesthesia with the patient or authorized representative who has indicated his/her understanding and acceptance.   Dental advisory given  Plan Discussed with: CRNA  Anesthesia Plan Comments:         Anesthesia Quick Evaluation                                   Anesthesia Evaluation  Patient identified by MRN, date of birth, ID band Patient awake    Reviewed: Allergy & Precautions, H&P , NPO status , Patient's Chart, lab  work & pertinent test results  Airway Mallampati: II TM Distance: >3 FB Neck ROM: Full    Dental  (+) Loose, Dental Advisory Given, Missing, Poor Dentition and Chipped   Pulmonary asthma ,  breath sounds clear to auscultation  Pulmonary exam normal       Cardiovascular negative cardio ROS  Rhythm:Regular Rate:Normal     Neuro/Psych negative neurological ROS  negative psych ROS   GI/Hepatic negative GI ROS, Neg liver ROS,   Endo/Other  Hypothyroidism Morbid obesity  Renal/GU negative Renal ROS  negative genitourinary   Musculoskeletal negative musculoskeletal ROS (+)   Abdominal   Peds negative pediatric ROS (+)  Hematology negative hematology ROS (+)   Anesthesia Other Findings Very poor dentition  Reproductive/Obstetrics negative OB ROS                          Anesthesia Physical Anesthesia Plan  ASA: III  Anesthesia Plan: General   Post-op Pain Management:    Induction: Intravenous  Airway Management Planned: Oral ETT  Additional Equipment:   Intra-op Plan:   Post-operative Plan: Extubation in OR  Informed Consent: I have reviewed the patients History and Physical, chart, labs and discussed the procedure including the risks, benefits and alternatives for the proposed anesthesia with the patient or authorized representative who has indicated his/her understanding and acceptance.   Dental advisory given  Plan Discussed with: CRNA and Surgeon  Anesthesia Plan Comments:         Anesthesia Quick Evaluation

## 2013-10-25 ENCOUNTER — Encounter (HOSPITAL_COMMUNITY): Payer: Self-pay | Admitting: Internal Medicine

## 2013-10-26 NOTE — Progress Notes (Signed)
Quick Note:  Notify by My Chart that biopsies ok F/u prn ______

## 2013-11-01 ENCOUNTER — Encounter (HOSPITAL_COMMUNITY): Payer: Self-pay | Admitting: Emergency Medicine

## 2013-11-01 ENCOUNTER — Emergency Department (INDEPENDENT_AMBULATORY_CARE_PROVIDER_SITE_OTHER)
Admission: EM | Admit: 2013-11-01 | Discharge: 2013-11-01 | Disposition: A | Discharge status: Home / Self Care | Payer: 59 | Source: Home / Self Care | Attending: Emergency Medicine | Admitting: Emergency Medicine

## 2013-11-01 DIAGNOSIS — K1120 Sialoadenitis, unspecified: Secondary | ICD-10-CM

## 2013-11-01 MED ORDER — HYDROCODONE-ACETAMINOPHEN 5-325 MG PO TABS: ORAL_TABLET | ORAL | Status: DC

## 2013-11-01 MED ORDER — AMOXICILLIN-POT CLAVULANATE 875-125 MG PO TABS: 1.0000 | ORAL_TABLET | Freq: Two times a day (BID) | ORAL | Status: DC

## 2013-11-01 NOTE — ED Notes (Signed)
Pain in jaw, no teeth

## 2013-11-01 NOTE — ED Provider Notes (Signed)
  Chief Complaint   Chief Complaint  Patient presents with  . Jaw Pain    History of Present Illness   Holly Mueller is a 48 year old female who has had swelling in her right parotid area since yesterday. She can feel a knot in this area. Is worse whenever she eats. She's edentulous. She denies any pain with chewing or biting. She's had no fever, chills, headache, stiff neck, sore throat, or oral lesions.  Review of Systems   Other than as noted above, the patient denies any of the following symptoms: Systemic:  No fevers or chills. Eye:  No redness, pain, discharge, itching, blurred vision, or diplopia. ENT:  No headache, nasal congestion, sneezing, itching, epistaxis, ear pain, decreased hearing, ringing in ears, vertigo, or tinnitus.  No oral lesions, sore throat, or hoarseness. Neck:  No neck pain or adenopathy. Skin:  No rash or itching.  [place]   Past medical history, family history, social history, meds, and allergies were reviewed.   Physical Examination     Vital signs:  BP 106/69  Pulse 66  Temp(Src) 98.7 F (37.1 C) (Oral)  Resp 14  SpO2 97% General:  Alert and oriented.  In no distress.  Skin warm and dry. Eye:  PERRL, full EOMs, lids and conjunctiva normal.   ENT:  TMs and canals clear.  Nasal mucosa not congested and without drainage.  Mucous membranes moist, no oral lesions, normal dentition, pharynx clear.  No cranial or facial pain to palplation. She has mild pain to palpation over right parotid gland. There was no erythema and no obvious swelling. No parotid mass. Exam of the oral cavity reveals no swelling or inflammation of Stensen's duct and no palpable stone. Neck:  Supple, full ROM.  No adenopathy, tenderness or mass.  Thyroid normal. Lungs:  Breath sounds clear and equal bilaterally.  No wheezes, rales or rhonchi. Heart:  Rhythm regular, without extrasystoles.  No gallops or murmers. Skin:  Clear, warm and dry.  Assessment   The encounter diagnosis  was Sialadenitis.  Plan    1.  Meds:  The following meds were prescribed:   Discharge Medication List as of 11/01/2013  4:50 PM    START taking these medications   Details  amoxicillin-clavulanate (AUGMENTIN) 875-125 MG per tablet Take 1 tablet by mouth 2 (two) times daily., Starting 11/01/2013, Until Discontinued, Normal    HYDROcodone-acetaminophen (NORCO/VICODIN) 5-325 MG per tablet 1 to 2 tabs every 4 to 6 hours as needed for pain., Print        2.  Patient Education/Counseling:  The patient was given appropriate handouts, self care instructions, and instructed in symptomatic relief.  Advised conservative treatment including sour lemon drops, hydration, heat, and massage over the next 4-5 days. No improvement after that, I suggested she followup with ENT.  3.  Follow up:  The patient was told to follow up here if no better in 3 to 4 days, or sooner if becoming worse in any way, and given some red flag symptoms such as fever, chills, headache, or stiff neck which would prompt immediate return.  With Dr. Melissa Montane next week if no better.     Harden Mo, MD 11/01/13 1800

## 2013-11-01 NOTE — Discharge Instructions (Signed)
Sialadenitis is an infection of the salivary glands: the parotid gland, located in front of the ears, and the submandibular gland located on the floor of the mouth.  This may or may not be associated with a stone in the duct of salivary gland.  In most cases, this will go away with the measures outlined below.  If not responding in a week, you will need to see an ear, nose, and throat doctor.   Take the entire prescription of the antibiotics prescribed until they are completely gone.   Be sure to maintain adequate hydration.  This means drinking at least 8 to 10 glasses of water daily.  It will help to stimulate the flow of saliva.  This can be done by sucking on sour lemon drops every 2 to 3 hours.  Rinse out your mouth with hot saline solution (1/2 tsp of table salt plus a pinch of baking soda in 8 oz. of hot water) every 2 to 3 hours.  Apply hot compresses to the painful area every 2 to 3 hours.  Take a washcloth or small hand towel, moisten it with water and put it in the microwave and microwave on high for 30 seconds.  Then apply to the painful area for 5 to 10 minutes.  If you get worse in any way, especially with high fever, worsening pain, difficulty breathing, or nausea and vomiting, return here right away for a recheck or go to the emergency department.

## 2013-11-03 ENCOUNTER — Ambulatory Visit: Payer: 59 | Admitting: Family Medicine

## 2013-11-06 ENCOUNTER — Telehealth: Payer: Self-pay

## 2013-11-06 NOTE — Telephone Encounter (Signed)
Spoke with patient and she said someone paid for her zegerid $15.00 co-pay and that it is working well.  She is going to try and stay on it but will call us back if she needs [place] to call in the Protonix since it's free.

## 2013-11-06 NOTE — Telephone Encounter (Signed)
Message copied by [place], PATTI E on Mon Nov 06, 2013  6:20 PM ------      Message from: Silvano Rusk E      Created: Mon Nov 06, 2013  1:09 PM       Does she want to try Protonix one more time? - it will be free            If so qd before breakfast #90 [id number] RF      ----- Message -----         From: Patti E [place], CMA         Sent: 11/06/2013           To: Gatha Mayer, MD            Patient is requesting to be switched from generic zegerid due to cost of co-pay.  Please advise Sir, thank you.       ------

## 2013-11-22 ENCOUNTER — Encounter (HOSPITAL_COMMUNITY): Payer: Self-pay | Admitting: Emergency Medicine

## 2013-11-22 ENCOUNTER — Emergency Department (HOSPITAL_COMMUNITY)
Admission: EM | Admit: 2013-11-22 | Discharge: 2013-11-22 | Disposition: A | Discharge status: Home / Self Care | Payer: 59 | Attending: Emergency Medicine | Admitting: Emergency Medicine

## 2013-11-22 DIAGNOSIS — Z791 Long term (current) use of non-steroidal anti-inflammatories (NSAID): Secondary | ICD-10-CM | POA: Insufficient documentation

## 2013-11-22 DIAGNOSIS — Z79899 Other long term (current) drug therapy: Secondary | ICD-10-CM | POA: Insufficient documentation

## 2013-11-22 DIAGNOSIS — M1610 Unilateral primary osteoarthritis, unspecified hip: Secondary | ICD-10-CM | POA: Insufficient documentation

## 2013-11-22 DIAGNOSIS — E039 Hypothyroidism, unspecified: Secondary | ICD-10-CM | POA: Insufficient documentation

## 2013-11-22 DIAGNOSIS — G40909 Epilepsy, unspecified, not intractable, without status epilepticus: Secondary | ICD-10-CM | POA: Insufficient documentation

## 2013-11-22 DIAGNOSIS — Z8719 Personal history of other diseases of the digestive system: Secondary | ICD-10-CM | POA: Insufficient documentation

## 2013-11-22 DIAGNOSIS — R531 Weakness: Secondary | ICD-10-CM

## 2013-11-22 DIAGNOSIS — Q393 Congenital stenosis and stricture of esophagus: Secondary | ICD-10-CM

## 2013-11-22 DIAGNOSIS — R5381 Other malaise: Secondary | ICD-10-CM | POA: Insufficient documentation

## 2013-11-22 DIAGNOSIS — Q391 Atresia of esophagus with tracheo-esophageal fistula: Secondary | ICD-10-CM | POA: Insufficient documentation

## 2013-11-22 DIAGNOSIS — R5383 Other fatigue: Principal | ICD-10-CM

## 2013-11-22 DIAGNOSIS — IMO0002 Reserved for concepts with insufficient information to code with codable children: Secondary | ICD-10-CM | POA: Insufficient documentation

## 2013-11-22 DIAGNOSIS — J45909 Unspecified asthma, uncomplicated: Secondary | ICD-10-CM | POA: Insufficient documentation

## 2013-11-22 LAB — URINALYSIS, ROUTINE W REFLEX MICROSCOPIC
Glucose, UA: NEGATIVE mg/dL
Ketones, ur: NEGATIVE mg/dL
Leukocytes, UA: NEGATIVE
NITRITE: NEGATIVE
Protein, ur: NEGATIVE mg/dL
Specific Gravity, Urine: 1.021 (ref 1.005–1.030)
UROBILINOGEN UA: 2 mg/dL — AB (ref 0.0–1.0)
pH: 6 (ref 5.0–8.0)

## 2013-11-22 LAB — URINE MICROSCOPIC-ADD ON

## 2013-11-22 LAB — I-STAT CHEM 8, ED
BUN: 9 mg/dL (ref 6–23)
CHLORIDE: 107 meq/L (ref 96–112)
CREATININE: 0.6 mg/dL (ref 0.50–1.10)
Calcium, Ion: 1.2 mmol/L (ref 1.12–1.23)
Glucose, Bld: 92 mg/dL (ref 70–99)
HCT: 39 % (ref 36.0–46.0)
Hemoglobin: 13.3 g/dL (ref 12.0–15.0)
Potassium: 3.8 mEq/L (ref 3.7–5.3)
Sodium: 139 mEq/L (ref 137–147)
TCO2: 21 mmol/L (ref 0–100)

## 2013-11-22 NOTE — Discharge Instructions (Signed)
Drink a 32 ounce bottle of gatorade tonight and another in the morning. Recheck if you get worsel

## 2013-11-22 NOTE — ED Notes (Addendum)
Pt reports coming into work and approx 12:30 pt had onset of feeling lightheaded, weak, dizzy, diaphoretic and had near syncopal episode, now just having fatigue and generalized weakness.

## 2013-11-22 NOTE — ED Provider Notes (Signed)
CSN: [id number]     Arrival date & time 11/22/13  1312 History   First MD Initiated Contact with Patient 11/22/13 1531     Chief Complaint  Patient presents with  . Dizziness  . Near Syncope     (Consider location/radiation/quality/duration/timing/severity/associated sxs/prior Treatment) HPI Patient states she felt fine when she went to work about 12:15. She states 30 minutes later started feeling weak and found it was hard to stand up. She states she got sweaty and felt lightheaded and dizzy. She took a wet rag and put it in her neck and her head. She however continued to feel bad. She states she has started to develop a mild headache here in ED. She denies chest pain, abdominal pain, nausea, vomiting, diarrhea, dysuria, or frequency. She does not have shortness of breath. She states she is sort of felt this way before when she had reactive airway disease however she denies any wheezing today. She states she ate breakfast today but has not had lunch. She states she has a mild nonproductive cough. She states she still feels weak. She states she the only new medication she is on was omeprazole started on June 22. Otherwise no changes in her routine. 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 She states it was not hot.  PCP Dr Skeet Simmer [facility]  Past Medical History  Diagnosis Date  . Asthma 2004  . GERD (gastroesophageal reflux disease) 2008  . Epilepsy in 1972    No seizures since 1972. Previously treated with phenobarbital.   . Hypothyroidism 2008  . Seasonal allergies 2003  . Headache(784.0)     hx of migraines   . Arthritis     bursitis left hip flares-not an issue  . Edentulous     10-19-13 at present  . Shortness of breath   . Lower esophageal ring 08/18/2013   Past Surgical History  Procedure Laterality Date  . Novasure ablation    . Shoulder arthroscopy Right 2011  . Cesarean section      x2  . Wisdom tooth extraction    . Ventral  hernia repair N/A 06/30/2012    Procedure: LAPAROSCOPIC VENTRAL HERNIA;  Surgeon: Shann Medal, MD;  Location: [facility];  Service: General;  Laterality: N/A;  With Mesh  . Umbilical hernia repair N/A 06/30/2012    Procedure: remove umbilicus;  Surgeon: Shann Medal, MD;  Location: [facility];  Service: General;  Laterality: N/A;  . Liver biopsy  06/30/2012    Procedure: LIVER BIOPSY;  Surgeon: Shann Medal, MD;  Location: [facility];  Service: General;;  . Dental surgery      multiple extractions 3'15  . Esophagogastroduodenoscopy N/A 10/24/2013    Procedure: ESOPHAGOGASTRODUODENOSCOPY (EGD);  Surgeon: Gatha Mayer, MD;  Location: [facility];  Service: Endoscopy;  Laterality: N/A;  . Balloon dilation N/A 10/24/2013    Procedure: BALLOON DILATION;  Surgeon: Gatha Mayer, MD;  Location: [facility];  Service: Endoscopy;  Laterality: N/A;   Family History  Problem Relation Age of Onset  . Hypertension Mother   . Diabetes Mother   . Lung cancer Father   . Stroke Father   . Diabetes Sister   . Hypertension Sister    History  Substance Use Topics  . Smoking status: Never Smoker   . Smokeless tobacco: Never Used  . Alcohol Use: 0.0 oz/week     Comment: special occasions.    employed   OB  History   Grav Para Term Preterm Abortions TAB SAB Ect Mult Living                 Review of Systems  All other systems reviewed and are negative.     Allergies  Aspirin  Home Medications   Prior to Admission medications   Medication Sig Start Date End Date Taking? Authorizing Provider  albuterol (PROVENTIL HFA;VENTOLIN HFA) 108 (90 BASE) MCG/ACT inhaler Inhale 2 puffs into the lungs every 6 (six) hours as needed for wheezing or shortness of breath.   Yes Historical Provider, MD  albuterol (PROVENTIL) (2.5 MG/3ML) 0.083% nebulizer solution Take 2.5 mg by nebulization every 6 (six) hours as needed for wheezing or shortness of breath.   Yes Historical Provider, MD  budesonide-formoterol  (SYMBICORT) 160-4.5 MCG/ACT inhaler Inhale 2 puffs into the lungs 2 (two) times daily.   Yes Historical Provider, MD  fluticasone (FLONASE) 50 MCG/ACT nasal spray Place 2 sprays into both nostrils daily.   Yes Historical Provider, MD  levothyroxine (SYNTHROID, LEVOTHROID) 300 MCG tablet Take 300 mcg by mouth daily before breakfast.   Yes Historical Provider, MD  naproxen sodium (ANAPROX) 220 MG tablet Take 220 mg by mouth 2 (two) times daily as needed (for pain).   Yes Historical Provider, MD  amoxicillin-clavulanate (AUGMENTIN) 875-125 MG per tablet Take 1 tablet by mouth 2 (two) times daily. 11/01/13   Harden Mo, MD   BP 112/91  Pulse 80  Temp(Src) 98.2 F (36.8 C) (Oral)  Resp 20  Ht _0  (1.676 m)  Wt 242 lb (109.77 kg)  BMI 39.08 kg/m2  SpO2 95%  Vital signs normal     17:07 Orthostatic Vital Signs TA Orthostatic Lying - BP- Lying: 109/75 mmHg (only complaint noted at this time is leg weakness) ; Pulse- Lying: 83  Orthostatic Sitting - BP- Sitting: 126/84 mmHg (only complaint noted at this time is leg weakness) ; Pulse- Sitting: 77  Orthostatic Standing at 0 minutes - BP- Standing at 0 minutes: 119/79 mmHg (only complaint noted at this time is leg weakness) ; Pulse- Standing at 0 minutes: 86  Orthostatic VS are normal  Physical Exam  Nursing note and vitals reviewed. Constitutional: She is oriented to person, place, and time. She appears well-developed and well-nourished.  Non-toxic appearance. She does not appear ill. No distress.  HENT:  Head: Normocephalic and atraumatic.  Right Ear: External ear normal.  Left Ear: External ear normal.  Nose: Nose normal. No mucosal edema or rhinorrhea.  Mouth/Throat: Oropharynx is clear and moist and mucous membranes are normal. No dental abscesses or uvula swelling.  Eyes: Conjunctivae and EOM are normal. Pupils are equal, round, and reactive to light.  Neck: Normal range of motion and full passive range of motion without pain. Neck  supple.  Cardiovascular: Normal rate, regular rhythm and normal heart sounds.  Exam reveals no gallop and no friction rub.   No murmur heard. Pulmonary/Chest: Effort normal and breath sounds normal. No respiratory distress. She has no wheezes. She has no rhonchi. She has no rales. She exhibits no tenderness and no crepitus.  Abdominal: Soft. Normal appearance and bowel sounds are normal. She exhibits no distension. There is no tenderness. There is no rebound and no guarding.  Musculoskeletal: Normal range of motion. She exhibits no edema and no tenderness.  Moves all extremities well.   Neurological: She is alert and oriented to person, place, and time. She has normal strength. No cranial nerve deficit.  Skin:  Skin is warm, dry and intact. No rash noted. No erythema. No pallor.  Psychiatric: She has a normal mood and affect. Her speech is normal and behavior is normal. Her mood appears not anxious.    ED Course  Procedures (including critical care time)     Labs Review Results for orders placed during the hospital encounter of 11/22/13  URINALYSIS, ROUTINE W REFLEX MICROSCOPIC      Result Value Ref Range   Color, Urine YELLOW  YELLOW   APPearance CLEAR  CLEAR   Specific Gravity, Urine 1.021  1.005 - 1.030   pH 6.0  5.0 - 8.0   Glucose, UA NEGATIVE  NEGATIVE mg/dL   Hgb urine dipstick TRACE (*) NEGATIVE   Bilirubin Urine SMALL (*) NEGATIVE   Ketones, ur NEGATIVE  NEGATIVE mg/dL   Protein, ur NEGATIVE  NEGATIVE mg/dL   Urobilinogen, UA 2.0 (*) 0.0 - 1.0 mg/dL   Nitrite NEGATIVE  NEGATIVE   Leukocytes, UA NEGATIVE  NEGATIVE  URINE MICROSCOPIC-ADD ON      Result Value Ref Range   Squamous Epithelial / LPF FEW (*) RARE   WBC, UA 0-2  <3 WBC/hpf   RBC / HPF 3-6  <3 RBC/hpf   Bacteria, UA FEW (*) RARE   Urine-Other MUCOUS PRESENT    I-STAT CHEM 8, ED      Result Value Ref Range   Sodium 139  137 - 147 mEq/L   Potassium 3.8  3.7 - 5.3 mEq/L   Chloride 107  96 - 112 mEq/L   BUN 9   6 - 23 mg/dL   Creatinine, Ser 0.60  0.50 - 1.10 mg/dL   Glucose, Bld 92  70 - 99 mg/dL   Calcium, Ion 1.20  1.12 - 1.23 mmol/L   TCO2 21  0 - 100 mmol/L   Hemoglobin 13.3  12.0 - 15.0 g/dL   HCT 39.0  36.0 - 46.0 %   Laboratory interpretation all normal .    Imaging Review No results found.   EKG Interpretation   Date/Time:  Wednesday November 22 2013 13:20:54 EDT Ventricular Rate:  77 PR Interval:  152 QRS Duration: 84 QT Interval:  366 QTC Calculation: 414 R Axis:   4 Text Interpretation:  Normal sinus rhythm Normal ECG No significant change  since last tracing 17 Aug 2013 Confirmed by [facility]  MD-I, Dhruvi Crenshaw ([id number]) on  11/22/2013 4:00:54 PM      MDM   Final diagnoses:  Weakness    Plan discharge  Rolland Porter, MD, Alanson Aly, MD 11/22/13 1941

## 2013-12-19 ENCOUNTER — Other Ambulatory Visit: Payer: Self-pay | Admitting: Family Medicine

## 2013-12-19 ENCOUNTER — Telehealth: Payer: Self-pay | Admitting: Internal Medicine

## 2013-12-19 NOTE — Telephone Encounter (Signed)
Spoke with Holly Mueller who said she has been approved for Medicaid and should get her card in the next several weeks.  In the mean time I've suggested for her to use OTC acid reducers and told her [facility] has the equate brand which is usually less expensive.  [facility] doesn't offer any $4.00 rx PPI's since there are OTC choices.  She is going to call us back when she gets her medicaid card and we will see which PPI Dr. Carlean Purl would like [place] to send in.

## 2014-01-05 ENCOUNTER — Ambulatory Visit: Payer: 59 | Admitting: Family Medicine

## 2014-01-10 ENCOUNTER — Telehealth: Payer: Self-pay

## 2014-01-10 NOTE — Telephone Encounter (Signed)
Received request from her pharmacy to change her PPI from zegerid to omeprazole because her insurance is now medicaid.  Please advise Sir, thank you.

## 2014-01-11 ENCOUNTER — Other Ambulatory Visit: Payer: Self-pay | Admitting: Internal Medicine

## 2014-01-11 MED ORDER — OMEPRAZOLE 40 MG PO CPDR: 40.0000 mg | DELAYED_RELEASE_CAPSULE | Freq: Every day | ORAL | Status: DC

## 2014-01-11 NOTE — Telephone Encounter (Signed)
Suspect she will have to do this unless there is a way she can and wants to buy Zegerid Talk to her before we switch

## 2014-01-11 NOTE — Telephone Encounter (Signed)
Holly Mueller said ok to switch Sir.  Please let me know which mg/sig you would like for her to have on the omeprazole. Thank you.

## 2014-01-11 NOTE — Telephone Encounter (Signed)
Omeprazole sent in to pharmacy as Dr. Carlean Purl approved.

## 2014-01-11 NOTE — Telephone Encounter (Signed)
Left message on her voice mail to call me back to discuss medicines.

## 2014-01-11 NOTE — Telephone Encounter (Signed)
Omeprazole 40 mg qd before breakfast #90 3 RF

## 2014-02-05 ENCOUNTER — Encounter: Payer: 59 | Admitting: Family Medicine

## 2014-02-08 ENCOUNTER — Emergency Department (HOSPITAL_COMMUNITY)
Admission: EM | Admit: 2014-02-08 | Discharge: 2014-02-08 | Disposition: A | Discharge status: Home / Self Care | Payer: Medicaid Other | Attending: Emergency Medicine | Admitting: Emergency Medicine

## 2014-02-08 ENCOUNTER — Emergency Department (HOSPITAL_COMMUNITY): Payer: Medicaid Other

## 2014-02-08 ENCOUNTER — Encounter (HOSPITAL_COMMUNITY): Payer: Self-pay | Admitting: Emergency Medicine

## 2014-02-08 DIAGNOSIS — Z79899 Other long term (current) drug therapy: Secondary | ICD-10-CM | POA: Insufficient documentation

## 2014-02-08 DIAGNOSIS — Z8669 Personal history of other diseases of the nervous system and sense organs: Secondary | ICD-10-CM | POA: Insufficient documentation

## 2014-02-08 DIAGNOSIS — J45901 Unspecified asthma with (acute) exacerbation: Secondary | ICD-10-CM

## 2014-02-08 DIAGNOSIS — R079 Chest pain, unspecified: Secondary | ICD-10-CM | POA: Insufficient documentation

## 2014-02-08 DIAGNOSIS — Z8739 Personal history of other diseases of the musculoskeletal system and connective tissue: Secondary | ICD-10-CM | POA: Diagnosis not present

## 2014-02-08 DIAGNOSIS — K219 Gastro-esophageal reflux disease without esophagitis: Secondary | ICD-10-CM | POA: Insufficient documentation

## 2014-02-08 DIAGNOSIS — E039 Hypothyroidism, unspecified: Secondary | ICD-10-CM | POA: Insufficient documentation

## 2014-02-08 DIAGNOSIS — Z7951 Long term (current) use of inhaled steroids: Secondary | ICD-10-CM | POA: Insufficient documentation

## 2014-02-08 DIAGNOSIS — J45909 Unspecified asthma, uncomplicated: Secondary | ICD-10-CM | POA: Diagnosis present

## 2014-02-08 LAB — BASIC METABOLIC PANEL
Anion gap: 12 (ref 5–15)
BUN: 11 mg/dL (ref 6–23)
CO2: 21 mEq/L (ref 19–32)
CREATININE: 0.67 mg/dL (ref 0.50–1.10)
Calcium: 8.9 mg/dL (ref 8.4–10.5)
Chloride: 108 mEq/L (ref 96–112)
GFR calc Af Amer: >90 mL/min (ref >90)
GFR calc non Af Amer: >90 mL/min (ref >90)
Glucose, Bld: 123 mg/dL — ABNORMAL HIGH (ref 70–99)
Potassium: 4 mEq/L (ref 3.7–5.3)
Sodium: 141 mEq/L (ref 137–147)

## 2014-02-08 LAB — I-STAT TROPONIN, ED: Troponin i, poc: 0 ng/mL (ref 0.00–0.08)

## 2014-02-08 LAB — CBC
HEMATOCRIT: 34.3 % — AB (ref 36.0–46.0)
Hemoglobin: 11.7 g/dL — ABNORMAL LOW (ref 12.0–15.0)
MCH: 29.4 pg (ref 26.0–34.0)
MCHC: 34.1 g/dL (ref 30.0–36.0)
MCV: 86.2 fL (ref 78.0–100.0)
Platelets: 105 10*3/uL — ABNORMAL LOW (ref 150–400)
RBC: 3.98 MIL/uL (ref 3.87–5.11)
RDW: 13.2 % (ref 11.5–15.5)
WBC: 2.1 10*3/uL — ABNORMAL LOW (ref 4.0–10.5)

## 2014-02-08 MED ORDER — IPRATROPIUM-ALBUTEROL 0.5-2.5 (3) MG/3ML IN SOLN
3.0000 mL | Freq: Once | RESPIRATORY_TRACT | Status: AC
  Administered 2014-02-08: 3 mL via RESPIRATORY_TRACT
  Filled 2014-02-08: qty 3

## 2014-02-08 MED ORDER — SODIUM CHLORIDE 0.9 % IV BOLUS (SEPSIS)
1000.0000 mL | Freq: Once | INTRAVENOUS | Status: AC
  Administered 2014-02-08: 1000 mL via INTRAVENOUS

## 2014-02-08 MED ORDER — PREDNISONE 20 MG PO TABS
60.0000 mg | ORAL_TABLET | Freq: Once | ORAL | Status: AC
  Administered 2014-02-08: 60 mg via ORAL
  Filled 2014-02-08: qty 3

## 2014-02-08 MED ORDER — PREDNISONE 20 MG PO TABS: ORAL_TABLET | ORAL | Status: DC

## 2014-02-08 MED ORDER — IBUPROFEN 800 MG PO TABS
800.0000 mg | ORAL_TABLET | Freq: Once | ORAL | Status: AC
  Administered 2014-02-08: 800 mg via ORAL
  Filled 2014-02-08: qty 1

## 2014-02-08 NOTE — ED Notes (Signed)
Pt in c/o cough and chest pain, states the pain is constant and is to her central chest, states pain is worse at night when she is laying flat in bed, also reports she thinks her asthma is acting up, pt alert and speaking in full sentences in triage, no distress noted

## 2014-02-08 NOTE — Discharge Instructions (Signed)
Stay hydrated.   Take motrin for pain.   Take prednisone as prescribed.   Use albuterol every 4 hrs as needed.   Follow up with your doctor.   Return to ER if you have trouble breathing, chest tightness.

## 2014-02-08 NOTE — ED Notes (Signed)
Patient transported to X-ray 

## 2014-02-08 NOTE — ED Provider Notes (Signed)
CSN: [id number]     Arrival date & time 02/08/14  1123 History   First MD Initiated Contact with Patient 02/08/14 1130     Chief Complaint  Patient presents with  . Asthma  . Chest Pain     (Consider location/radiation/quality/duration/timing/severity/associated sxs/prior Treatment) The history is provided by the patient.  Holly Mueller is a 48 y.o. female hx of asthma, GERD, here with chest pain, shortness of breath. She just came back from [place] several days ago. For the last few days, she has been having tightness in her chest that is worse with coughing and at night. Not pleuritic, has some nonproductive cough. She has been using her inhalers with minimal relief. Denies fevers. Denies previous admissions for asthma. No hx of PE or DVT.    Past Medical History  Diagnosis Date  . Asthma 2004  . GERD (gastroesophageal reflux disease) 2008  . Epilepsy in 1972    No seizures since 1972. Previously treated with phenobarbital.   . Hypothyroidism 2008  . Seasonal allergies 2003  . Headache(784.0)     hx of migraines   . Arthritis     bursitis left hip flares-not an issue  . Edentulous     10-19-13 at present  . Shortness of breath   . Lower esophageal ring 08/18/2013   Past Surgical History  Procedure Laterality Date  . Novasure ablation    . Shoulder arthroscopy Right 2011  . Cesarean section      x2  . Wisdom tooth extraction    . Ventral hernia repair N/A 06/30/2012    Procedure: LAPAROSCOPIC VENTRAL HERNIA;  Surgeon: Shann Medal, MD;  Location: [facility];  Service: General;  Laterality: N/A;  With Mesh  . Umbilical hernia repair N/A 06/30/2012    Procedure: remove umbilicus;  Surgeon: Shann Medal, MD;  Location: [facility];  Service: General;  Laterality: N/A;  . Liver biopsy  06/30/2012    Procedure: LIVER BIOPSY;  Surgeon: Shann Medal, MD;  Location: [facility];  Service: General;;  . Dental surgery      multiple extractions 3'15  . Esophagogastroduodenoscopy N/A  10/24/2013    Procedure: ESOPHAGOGASTRODUODENOSCOPY (EGD);  Surgeon: Gatha Mayer, MD;  Location: [facility];  Service: Endoscopy;  Laterality: N/A;  . Balloon dilation N/A 10/24/2013    Procedure: BALLOON DILATION;  Surgeon: Gatha Mayer, MD;  Location: [facility];  Service: Endoscopy;  Laterality: N/A;   Family History  Problem Relation Age of Onset  . Hypertension Mother   . Diabetes Mother   . Lung cancer Father   . Stroke Father   . Diabetes Sister   . Hypertension Sister    History  Substance Use Topics  . Smoking status: Never Smoker   . Smokeless tobacco: Never Used  . Alcohol Use: 0.0 oz/week     Comment: special occasions.    OB History   Grav Para Term Preterm Abortions TAB SAB Ect Mult Living                 Review of Systems  Respiratory: Positive for shortness of breath.   Cardiovascular: Positive for chest pain.  All other systems reviewed and are negative.     Allergies  Aspirin  Home Medications   Prior to Admission medications   Medication Sig Start Date End Date Taking? Authorizing Provider  albuterol (PROVENTIL HFA;VENTOLIN HFA) 108 (90 BASE) MCG/ACT inhaler Inhale 2 puffs into the lungs every 6 (six) hours as needed  for wheezing or shortness of breath.   Yes Historical Provider, MD  albuterol (PROVENTIL) (2.5 MG/3ML) 0.083% nebulizer solution Take 2.5 mg by nebulization every 6 (six) hours as needed for wheezing or shortness of breath.   Yes Historical Provider, MD  budesonide-formoterol (SYMBICORT) 160-4.5 MCG/ACT inhaler Inhale 2 puffs into the lungs 2 (two) times daily.   Yes Historical Provider, MD  fluticasone (FLONASE) 50 MCG/ACT nasal spray Place 2 sprays into both nostrils daily.   Yes Historical Provider, MD  levothyroxine (SYNTHROID, LEVOTHROID) 300 MCG tablet Take 300 mcg by mouth daily before breakfast.   Yes Historical Provider, MD  montelukast (SINGULAIR) 10 MG tablet Take 10 mg by mouth daily.   Yes Historical Provider, MD   omeprazole (PRILOSEC) 40 MG capsule Take 1 capsule (40 mg total) by mouth daily. 01/11/14  Yes Gatha Mayer, MD   BP 109/65  Pulse 64  Temp(Src) 97.8 F (36.6 C) (Oral)  Resp 17  SpO2 99% Physical Exam  Nursing note and vitals reviewed. Constitutional: She is oriented to person, place, and time. She appears well-developed and well-nourished.  HENT:  Head: Normocephalic.  Mouth/Throat: Oropharynx is clear and moist.  Eyes: Conjunctivae are normal. Pupils are equal, round, and reactive to light.  Neck: Normal range of motion. Neck supple.  Cardiovascular: Normal rate, regular rhythm and normal heart sounds.   Pulmonary/Chest:  Mod air movement, minimal wheezing throughout. No retractions. Pain reproducible on palpation   Abdominal: Soft. Bowel sounds are normal. She exhibits no distension. There is no rebound.  Musculoskeletal: Normal range of motion. She exhibits no edema and no tenderness.  Neurological: She is alert and oriented to person, place, and time. No cranial nerve deficit. Coordination normal.  Skin: Skin is warm and dry.  Psychiatric: She has a normal mood and affect. Her behavior is normal. Thought content normal.    ED Course  Procedures (including critical care time) Labs Review Labs Reviewed  CBC - Abnormal; Notable for the following:    WBC 2.1 (*)    Hemoglobin 11.7 (*)    HCT 34.3 (*)    Platelets 105 (*)    All other components within normal limits  BASIC METABOLIC PANEL - Abnormal; Notable for the following:    Glucose, Bld 123 (*)    All other components within normal limits  I-STAT TROPOININ, ED    Imaging Review Dg Chest 2 View  02/08/2014   CLINICAL DATA:  Shortness of breath.  Asthma .  EXAM: CHEST  2 VIEW  COMPARISON:  08/16/2013.  04/25/2013 .  FINDINGS: Mediastinum hilar structures normal. Bibasilar subsegmental atelectasis and/or scarring again noted. No acute pulmonary infiltrates. No pleural effusion or pneumothorax. Heart size normal. No  acute bony abnormality.  IMPRESSION: Bibasilar subsegmental atelectasis and/or scarring. No acute abnormality.   Electronically Signed   By: Marcello Moores  Register   On: 02/08/2014 12:44     EKG Interpretation   Date/Time:  Thursday February 08 2014 11:30:08 EDT Ventricular Rate:  73 PR Interval:  160 QRS Duration: 86 QT Interval:  390 QTC Calculation: 429 R Axis:   6 Text Interpretation:  Normal sinus rhythm Cannot rule out Anterior infarct  , age undetermined Abnormal ECG No significant change since last tracing  Confirmed by Jeny Nield  MD, Saadiya Wilfong ([id number]) on 02/08/2014 12:24:31 PM      MDM   Final diagnoses:  None    Holly Mueller is a 48 y.o. female here with cough, chest tightness. Likely asthma exacerbation with  muscle strain. Will give steroids, nebs, NSAIDs. I doubt ACS or PE. Will get cxr and labs and reassess.   2:47 PM Labs unremarkable. CXR clear. Given 2 nebs, steroids, felt better. Minimal wheezing afterwards, never hypoxic. Will d/c home on steroids.    Wandra Arthurs, MD 02/08/14 [phone]

## 2014-06-11 ENCOUNTER — Other Ambulatory Visit: Payer: Self-pay | Admitting: Family Medicine

## 2014-06-11 ENCOUNTER — Ambulatory Visit (INDEPENDENT_AMBULATORY_CARE_PROVIDER_SITE_OTHER): Payer: Medicaid Other | Admitting: Family Medicine

## 2014-06-11 VITALS — BP 116/80 | HR 81 | Temp 97.4°F | Ht 66.0 in | Wt 257.2 lb

## 2014-06-11 DIAGNOSIS — J45901 Unspecified asthma with (acute) exacerbation: Secondary | ICD-10-CM

## 2014-06-11 MED ORDER — PREDNISONE 50 MG PO TABS: 50.0000 mg | ORAL_TABLET | Freq: Every day | ORAL | Status: DC

## 2014-06-11 MED ORDER — ALBUTEROL SULFATE (2.5 MG/3ML) 0.083% IN NEBU: 2.5000 mg | INHALATION_SOLUTION | Freq: Four times a day (QID) | RESPIRATORY_TRACT | Status: DC | PRN

## 2014-06-11 NOTE — Progress Notes (Signed)
    Subjective   Holly Mueller is a 49 y.o. female that presents for a same day visit  1. Concern for asthma exacerbation: Symptoms started two weeks ago with coughing and dyspnea. Her cough is productive with clear sputum. She recently lost her job and does not have money for medication. She has been using her albuterol 2 puffs every 4 hours. She is using albuterol nebulizer every 4-6 hours. Her Symbicort has been taken 2 puffs BID. She has 3-4 pillow orthopnea that started last week. She has dyspnea on exertion that has worsened since two weeks ago. No fevers, chills. Usual triggers include: weather changes  History  Substance Use Topics  . Smoking status: Never Smoker   . Smokeless tobacco: Never Used  . Alcohol Use: 0.0 oz/week     Comment: special occasions.    Meds ordered this encounter  Medications  . albuterol (PROVENTIL) (2.5 MG/3ML) 0.083% nebulizer solution    Sig: Take 3 mLs (2.5 mg total) by nebulization every 6 (six) hours as needed for wheezing or shortness of breath.    Dispense:  75 mL    Refill:  2  . predniSONE (DELTASONE) 50 MG tablet    Sig: Take 1 tablet (50 mg total) by mouth daily with breakfast.    Dispense:  5 tablet    Refill:  0    ROS Per HPI  Objective   BP 116/80 mmHg  Pulse 81  Temp(Src) 97.4 F (36.3 C) (Oral)  Ht _0  (1.676 m)  Wt 257 lb 3.2 oz (116.665 kg)  BMI 41.53 kg/m2. SpO2: 96% on room air  General: Fair appearing, had a coughing fit while in the exam room Respiratory/Chest: bibasilar rales, no wheezing, decreased air movement in bases that improved slightly after treatment. No accessory muscle usage Cardiovascular: Regular rate and rhythm; no noticeable JVD Extremities: 1+ pitting edema bilaterally  Assessment and Plan   Asthma exacerbation: patient given albuterol/atrovent neb in office. Symptoms improved slightly with resolution of severe coughing. No signs of acute infection. Physical exam only significant for rales  bibasilar. With orthopnea, heart failure also in differential; no previous echo. Also, pneumonia in differential, although afebrile. Oxygen saturation 96% on room air.  Albuterol nebulizer refilled  Prednisone 56m 1tab qD x5 days  Follow-up in 3-4 days  Return precautions discussed

## 2014-06-11 NOTE — Patient Instructions (Signed)
Thank you for coming to see me today. It was a pleasure. Today we talked about:   Asthma: it looks like you're having an asthma exacerbation. I gave you some medication in the office. I'm also sending you home with some steroids to take for 5 days. Please return for follow-up this Thursday or Friday. Please use your albuterol EVERY 4 hours while awake for the next 3 days. If you have worsening trouble breathing or develop chest pain/fevers, please return promptly  If you have any questions or concerns, please do not hesitate to call the office at [phone].  Sincerely,  Cordelia Poche, MD

## 2014-06-12 ENCOUNTER — Encounter: Payer: Self-pay | Admitting: Family Medicine

## 2014-06-13 MED ORDER — ALBUTEROL SULFATE (2.5 MG/3ML) 0.083% IN NEBU
2.5000 mg | INHALATION_SOLUTION | Freq: Once | RESPIRATORY_TRACT | Status: AC
  Administered 2014-06-13: 2.5 mg via RESPIRATORY_TRACT

## 2014-06-13 MED ORDER — IPRATROPIUM BROMIDE 0.02 % IN SOLN
0.5000 mg | Freq: Once | RESPIRATORY_TRACT | Status: AC
  Administered 2014-06-13: 0.5 mg via RESPIRATORY_TRACT

## 2014-06-13 NOTE — Addendum Note (Signed)
Addended by: Leonia Corona R on: 06/13/2014 03:14 PM   Modules accepted: Orders

## 2014-06-28 ENCOUNTER — Other Ambulatory Visit: Payer: Self-pay | Admitting: Family Medicine

## 2014-07-08 ENCOUNTER — Emergency Department (HOSPITAL_COMMUNITY)
Admission: EM | Admit: 2014-07-08 | Discharge: 2014-07-08 | Disposition: A | Discharge status: Home / Self Care | Payer: Medicaid Other | Attending: Emergency Medicine | Admitting: Emergency Medicine

## 2014-07-08 ENCOUNTER — Encounter (HOSPITAL_COMMUNITY): Payer: Self-pay | Admitting: Physical Medicine and Rehabilitation

## 2014-07-08 DIAGNOSIS — Z79899 Other long term (current) drug therapy: Secondary | ICD-10-CM | POA: Diagnosis not present

## 2014-07-08 DIAGNOSIS — Z7951 Long term (current) use of inhaled steroids: Secondary | ICD-10-CM | POA: Diagnosis not present

## 2014-07-08 DIAGNOSIS — K219 Gastro-esophageal reflux disease without esophagitis: Secondary | ICD-10-CM | POA: Diagnosis not present

## 2014-07-08 DIAGNOSIS — Z7952 Long term (current) use of systemic steroids: Secondary | ICD-10-CM | POA: Diagnosis not present

## 2014-07-08 DIAGNOSIS — Z8739 Personal history of other diseases of the musculoskeletal system and connective tissue: Secondary | ICD-10-CM | POA: Diagnosis not present

## 2014-07-08 DIAGNOSIS — E669 Obesity, unspecified: Secondary | ICD-10-CM | POA: Diagnosis not present

## 2014-07-08 DIAGNOSIS — L02211 Cutaneous abscess of abdominal wall: Secondary | ICD-10-CM | POA: Insufficient documentation

## 2014-07-08 DIAGNOSIS — E039 Hypothyroidism, unspecified: Secondary | ICD-10-CM | POA: Insufficient documentation

## 2014-07-08 DIAGNOSIS — Z8669 Personal history of other diseases of the nervous system and sense organs: Secondary | ICD-10-CM | POA: Diagnosis not present

## 2014-07-08 DIAGNOSIS — J45909 Unspecified asthma, uncomplicated: Secondary | ICD-10-CM | POA: Diagnosis not present

## 2014-07-08 MED ORDER — HYDROCODONE-ACETAMINOPHEN 5-325 MG PO TABS: 1.0000 | ORAL_TABLET | Freq: Four times a day (QID) | ORAL | Status: DC | PRN

## 2014-07-08 MED ORDER — SULFAMETHOXAZOLE-TRIMETHOPRIM 800-160 MG PO TABS: 1.0000 | ORAL_TABLET | Freq: Two times a day (BID) | ORAL | Status: DC

## 2014-07-08 MED ORDER — CEPHALEXIN 500 MG PO CAPS: 1000.0000 mg | ORAL_CAPSULE | Freq: Two times a day (BID) | ORAL | Status: DC

## 2014-07-08 MED ORDER — LIDOCAINE-EPINEPHRINE (PF) 2 %-1:200000 IJ SOLN
10.0000 mL | Freq: Once | INTRAMUSCULAR | Status: AC
  Administered 2014-07-08: 10 mL via INTRADERMAL
  Filled 2014-07-08: qty 20

## 2014-07-08 NOTE — Discharge Instructions (Signed)
Please follow up with your doctor in  2 days for a wound recheck.  Take antibiotics as prescribed.  Return if your condition worsen or if you have other concerns.  Abscess Care After An abscess (also called a boil or furuncle) is an infected area that contains a collection of pus. Signs and symptoms of an abscess include pain, tenderness, redness, or hardness, or you may feel a moveable soft area under your skin. An abscess can occur anywhere in the body. The infection may spread to surrounding tissues causing cellulitis. A cut (incision) by the surgeon was made over your abscess and the pus was drained out. Gauze may have been packed into the space to provide a drain that will allow the cavity to heal from the inside outwards. The boil may be painful for 5 to 7 days. Most people with a boil do not have high fevers. Your abscess, if seen early, may not have localized, and may not have been lanced. If not, another appointment may be required for this if it does not get better on its own or with medications. HOME CARE INSTRUCTIONS   Only take over-the-counter or prescription medicines for pain, discomfort, or fever as directed by your caregiver.  When you bathe, soak and then remove gauze or iodoform packs at least daily or as directed by your caregiver. You may then wash the wound gently with mild soapy water. Repack with gauze or do as your caregiver directs. SEEK IMMEDIATE MEDICAL CARE IF:   You develop increased pain, swelling, redness, drainage, or bleeding in the wound site.  You develop signs of generalized infection including muscle aches, chills, fever, or a general ill feeling.  An oral temperature above 102 F (38.9 C) develops, not controlled by medication. See your caregiver for a recheck if you develop any of the symptoms described above. If medications (antibiotics) were prescribed, take them as directed. Document Released: 11/13/2004 Document Revised: 07/20/2011 Document Reviewed:  07/11/2007 [facility] Patient Information 2015 [place], [place]. This information is not intended to replace advice given to you by your health care provider. Make sure you discuss any questions you have with your health care provider.

## 2014-07-08 NOTE — ED Provider Notes (Signed)
CSN: [id number]     Arrival date & time 07/08/14  1835 History  This chart was scribed for non-physician practitioner, Domenic Moras, PA-C,working with Johnna Acosta, MD, by Marlowe Kays, ED Scribe. This patient was seen in room TR11C/TR11C and the patient's care was started at 7:46 PM.  Chief Complaint  Patient presents with  . Abscess   Patient is a 49 y.o. female presenting with abscess. The history is provided by the patient and medical records. No language interpreter was used.  Abscess Associated symptoms: no fever, no nausea and no vomiting     HPI Comments:  Holly Mueller is a 49 y.o. obese female who presents to the Emergency Department complaining of an abscess to her left lower abdomen that she noticed yesterday. Pt reports severe pain and states it has grown since yesterday. Reports generalized body aches since yesterday. Pt reports applying fat back and warm compresses with no significant relief of the symptoms. Walking and movement makes the pain worse. Denies alleviating factors. Denies fever, chills, nausea or vomiting. Reports having abscesses in the past. Pt states her tetanus vaccination has been more than 5 years ago but is unsure of when she last received it. PMHx of asthma, GERD, epilepsy, hypothyroidism, seasonal allergies and arthritis.  Past Medical History  Diagnosis Date  . Asthma 2004  . GERD (gastroesophageal reflux disease) 2008  . Epilepsy in 1972    No seizures since 1972. Previously treated with phenobarbital.   . Hypothyroidism 2008  . Seasonal allergies 2003  . Headache(784.0)     hx of migraines   . Arthritis     bursitis left hip flares-not an issue  . Edentulous     10-19-13 at present  . Shortness of breath   . Lower esophageal ring 08/18/2013   Past Surgical History  Procedure Laterality Date  . Novasure ablation    . Shoulder arthroscopy Right 2011  . Cesarean section      x2  . Wisdom tooth extraction    . Ventral hernia repair N/A  06/30/2012    Procedure: LAPAROSCOPIC VENTRAL HERNIA;  Surgeon: Shann Medal, MD;  Location: [facility];  Service: General;  Laterality: N/A;  With Mesh  . Umbilical hernia repair N/A 06/30/2012    Procedure: remove umbilicus;  Surgeon: Shann Medal, MD;  Location: [facility];  Service: General;  Laterality: N/A;  . Liver biopsy  06/30/2012    Procedure: LIVER BIOPSY;  Surgeon: Shann Medal, MD;  Location: [facility];  Service: General;;  . Dental surgery      multiple extractions 3'15  . Esophagogastroduodenoscopy N/A 10/24/2013    Procedure: ESOPHAGOGASTRODUODENOSCOPY (EGD);  Surgeon: Gatha Mayer, MD;  Location: [facility];  Service: Endoscopy;  Laterality: N/A;  . Balloon dilation N/A 10/24/2013    Procedure: BALLOON DILATION;  Surgeon: Gatha Mayer, MD;  Location: [facility];  Service: Endoscopy;  Laterality: N/A;   Family History  Problem Relation Age of Onset  . Hypertension Mother   . Diabetes Mother   . Lung cancer Father   . Stroke Father   . Diabetes Sister   . Hypertension Sister    History  Substance Use Topics  . Smoking status: Never Smoker   . Smokeless tobacco: Never Used  . Alcohol Use: 0.0 oz/week   OB History    No data available     Review of Systems  Constitutional: Negative for fever and chills.  Gastrointestinal: Negative for nausea and vomiting.  Musculoskeletal: Positive for myalgias (generalized body aches).  Skin: Positive for color change (abscess to left lower abdomen).    Allergies  Aspirin  Home Medications   Prior to Admission medications   Medication Sig Start Date End Date Taking? Authorizing Provider  albuterol (PROVENTIL HFA;VENTOLIN HFA) 108 (90 BASE) MCG/ACT inhaler Inhale 2 puffs into the lungs every 6 (six) hours as needed for wheezing or shortness of breath.    Historical Provider, MD  albuterol (PROVENTIL) (2.5 MG/3ML) 0.083% nebulizer solution Take 3 mLs (2.5 mg total) by nebulization every 6 (six) hours as needed for wheezing or  shortness of breath. 06/11/14   Cordelia Poche, MD  budesonide-formoterol [facility]) 160-4.5 MCG/ACT inhaler Inhale 2 puffs into the lungs 2 (two) times daily.    Historical Provider, MD  fluticasone (FLONASE) 50 MCG/ACT nasal spray Place 2 sprays into both nostrils daily.    Historical Provider, MD  levothyroxine (SYNTHROID, LEVOTHROID) 300 MCG tablet Take 300 mcg by mouth daily before breakfast.    Historical Provider, MD  levothyroxine (SYNTHROID, LEVOTHROID) 300 MCG tablet TAKE 1 TABLET BY MOUTH ONCE DAILY 06/11/14   [place] N Rumley, DO  montelukast (SINGULAIR) 10 MG tablet Take 10 mg by mouth daily.    Historical Provider, MD  omeprazole (PRILOSEC) 40 MG capsule Take 1 capsule (40 mg total) by mouth daily. 01/11/14   Gatha Mayer, MD  predniSONE (DELTASONE) 50 MG tablet Take 1 tablet (50 mg total) by mouth daily with breakfast. 06/11/14   Cordelia Poche, MD  SYMBICORT 160-4.5 MCG/ACT inhaler INHALE 2 PUFFS INTO THE LUNGS 2 TIMES DAILY. 06/29/14   [place] Zipporah Plants, DO   Triage Vitals: BP 129/80 mmHg  Pulse 86  Temp(Src) 98.8 F (37.1 C) (Oral)  Resp 18  SpO2 97% Physical Exam  Constitutional: She is oriented to person, place, and time. She appears well-developed and well-nourished.  HENT:  Head: Normocephalic and atraumatic.  Eyes: EOM are normal.  Neck: Normal range of motion.  Cardiovascular: Normal rate.   Pulmonary/Chest: Effort normal.  Musculoskeletal: Normal range of motion.  Neurological: She is alert and oriented to person, place, and time.  Skin: Skin is warm and dry. There is erythema.  Area of induration and fluctuation noted to cutaneous abdominal wall at panus. Moderate tenderness to palpation. Moderate amount of surrounding erythema.  Psychiatric: She has a normal mood and affect. Her behavior is normal.  Nursing note and vitals reviewed.   ED Course  Procedures (including critical care time) DIAGNOSTIC STUDIES: Oxygen Saturation is 97% on RA, normal by my interpretation.    COORDINATION OF CARE: 7:49 PM- Will I & D abscess. Pt verbalizes understanding and agrees to plan.  INCISION AND DRAINAGE PROCEDURE NOTE: Patient identification was confirmed and verbal consent was obtained. This procedure was performed by Domenic Moras, PA-C at 8:52 PM. Site: left lower abdomen Sterile procedures observed Needle size: 27 G Anesthetic used (type and amt): Lidocaine 2% with Epinephrine (2 mLs) Blade size: 11 Drainage: mild to moderate Complexity: Complex Packing used: 1/4 inch Iodoform Site anesthetized, incision made over site, wound drained and explored loculations, rinsed with copious amounts of normal saline, wound packed with sterile gauze, covered with dry, sterile dressing.  Pt tolerated procedure well without complications.  Instructions for care discussed verbally and pt provided with additional written instructions for homecare and f/u.   Medications  lidocaine-EPINEPHrine (XYLOCAINE W/EPI) 2 %-1:200000 (PF) injection 10 mL (10 mLs Intradermal Given by Other 07/08/14 2039)    Labs Review Labs  Reviewed - No data to display  Imaging Review No results found.   EKG Interpretation None      MDM   Final diagnoses:  Cutaneous abscess of abdominal wall    BP 129/80 mmHg  Pulse 86  Temp(Src) 98.8 F (37.1 C) (Oral)  Resp 18  SpO2 97%   I personally performed the services described in this documentation, which was scribed in my presence. The recorded information has been reviewed and is accurate.    Domenic Moras, PA-C 07/08/14 2103  Johnna Acosta, MD 07/09/14 [phone]

## 2014-07-08 NOTE — ED Notes (Signed)
Pt presents to department for evaluation of abscess to L lower abdomen. Ongoing for several days. 5/10 pain upon arrival. NAD.

## 2014-07-10 ENCOUNTER — Ambulatory Visit (INDEPENDENT_AMBULATORY_CARE_PROVIDER_SITE_OTHER): Payer: Medicaid Other | Admitting: Family Medicine

## 2014-07-10 ENCOUNTER — Encounter: Payer: Self-pay | Admitting: Family Medicine

## 2014-07-10 VITALS — BP 132/85 | HR 75 | Temp 98.2°F | Ht 66.0 in | Wt 264.5 lb

## 2014-07-10 DIAGNOSIS — L02211 Cutaneous abscess of abdominal wall: Secondary | ICD-10-CM

## 2014-07-10 MED ORDER — SULFAMETHOXAZOLE-TRIMETHOPRIM 800-160 MG PO TABS: 2.0000 | ORAL_TABLET | Freq: Two times a day (BID) | ORAL | Status: DC

## 2014-07-10 NOTE — Patient Instructions (Signed)
It was a pleasure to see you today.  I repacked the abscess today, I ask that you make an appointment to have it repacked in 2 more days.   I gave you a new prescription for antibiotics, to replace the two antibiotic prescriptions you received in the ED.  DO NOT TAKE THE KEFLEX (cephalexin) OR THE BACTRIM PRESCRIPTIONS FROM THE ED.  I am prescribing you BACTRIM DS, take 2 tablets by mouth twice daily for 7 days.  THIS IS A DIFFERENT INSTRUCTION FROM THE PREVIOUS BACTRIM PRESCRIPTION.  FOLLOW UP WITH PHYSICIAN IN [facility] IN 2 DAYS FOR RE-PACKING OF THE ABSCESS.

## 2014-07-11 NOTE — Assessment & Plan Note (Signed)
I&D of abdominal wall abscess on 07/08/2014. Has not started abx that were prescribed in ED.  Packing change done in office 07/10/2014, started Bactrim DS 2 tablets by mouth twice daily for 7 days. Follow up for repeat packing change in 2 days.

## 2014-07-11 NOTE — Progress Notes (Signed)
Subjective:    Patient ID: Holly Mueller, female    DOB: Oct 31, 1965, 49 y.o.   MRN: [id number]  HPI Patient here for SDA, for follow up of ED visit for I&D of abdominal wall abscess, performed on 07/08/2014.  She had been prescribed Keflex and Bactrim in the ED, however she did not have these filled. Had 1/4" packing placed.   She denies development of fevers/chills/sweats,  Has been feeling well other than the pain from the abscess.    Review of Systems     Objective:   Physical Exam Generally well-appearing, no apparent distress HEENT Neck supple.  ABD: Soft, nontender.  Pen marking around previous area of erythema (dated 02/28) with significant (60% reduction in surface area of erythema.  Central incision mark clean, with 10 inches of 1/4"-wide   Packing changed, replaced with approx 5" long strip of sterile 1/4" wide gauze. Tolerated well.        Assessment & Plan:

## 2014-07-12 ENCOUNTER — Ambulatory Visit (INDEPENDENT_AMBULATORY_CARE_PROVIDER_SITE_OTHER): Payer: Medicaid Other | Admitting: Family Medicine

## 2014-07-12 ENCOUNTER — Encounter: Payer: Self-pay | Admitting: Family Medicine

## 2014-07-12 VITALS — BP 136/83 | HR 70 | Temp 98.1°F | Ht 66.0 in | Wt 265.4 lb

## 2014-07-12 DIAGNOSIS — L02211 Cutaneous abscess of abdominal wall: Secondary | ICD-10-CM

## 2014-07-12 NOTE — Progress Notes (Signed)
Subjective: Holly Mueller is a 49 y.o. female patient of Dr. Johnathan Hausen presenting for abscess reevaluation.  She underwent I&Dof abdominal wall abscess in ED on 2/28, had packing change 3/1, and reports drastic improvement in pain since then. Redness has improved and she has tolerated bactrim as prescribed 3/1. No fevers or other sites of infection.   Objective: BP 136/83 mmHg  HR 70  Temp(oral) 98.76F  Wt 265 lb 6.4 oz  BMI 42.86 kg/m2 Gen: Well-appearing obese 49 y.o. female in no distress GI: Soft, nontender Skin: ~5cm loosely packed gauze unpacked from wound on left lower abdomen thru ~1.5cm transverse incision with < 1 cm surrounding erythema (blue pen demarcation of previous erythema measuring several inches in all directions). Scant exudate with good granulation tissue in wound bed.   Assessment/Plan: Holly Mueller is a 49 y.o. female here for reevaluation of abscess with proper healing.  - Continue abx - Wound care and supplies given - Dr. Lindell Noe was consulted for reevaluation

## 2014-07-12 NOTE — Assessment & Plan Note (Signed)
Packing removed, nearly no drainage, good granulation in wound bed with limited erythema. Will allow healing by secondary intention and provide education and supplies for wound care. To continue bactrim for full 7-day course.

## 2014-07-13 NOTE — Progress Notes (Signed)
I was preceptor the day of this visit.

## 2014-12-14 ENCOUNTER — Emergency Department (HOSPITAL_COMMUNITY)
Admission: EM | Admit: 2014-12-14 | Discharge: 2014-12-14 | Disposition: A | Discharge status: Home / Self Care | Payer: Medicaid Other | Attending: Emergency Medicine | Admitting: Emergency Medicine

## 2014-12-14 ENCOUNTER — Encounter (HOSPITAL_COMMUNITY): Payer: Self-pay | Admitting: Emergency Medicine

## 2014-12-14 DIAGNOSIS — E039 Hypothyroidism, unspecified: Secondary | ICD-10-CM | POA: Insufficient documentation

## 2014-12-14 DIAGNOSIS — R52 Pain, unspecified: Secondary | ICD-10-CM | POA: Diagnosis not present

## 2014-12-14 DIAGNOSIS — J45909 Unspecified asthma, uncomplicated: Secondary | ICD-10-CM | POA: Insufficient documentation

## 2014-12-14 DIAGNOSIS — M1990 Unspecified osteoarthritis, unspecified site: Secondary | ICD-10-CM | POA: Insufficient documentation

## 2014-12-14 DIAGNOSIS — J0140 Acute pansinusitis, unspecified: Secondary | ICD-10-CM | POA: Diagnosis not present

## 2014-12-14 DIAGNOSIS — Z7952 Long term (current) use of systemic steroids: Secondary | ICD-10-CM | POA: Insufficient documentation

## 2014-12-14 DIAGNOSIS — G40909 Epilepsy, unspecified, not intractable, without status epilepticus: Secondary | ICD-10-CM | POA: Insufficient documentation

## 2014-12-14 DIAGNOSIS — Z79899 Other long term (current) drug therapy: Secondary | ICD-10-CM | POA: Insufficient documentation

## 2014-12-14 DIAGNOSIS — Z7951 Long term (current) use of inhaled steroids: Secondary | ICD-10-CM | POA: Diagnosis not present

## 2014-12-14 DIAGNOSIS — K219 Gastro-esophageal reflux disease without esophagitis: Secondary | ICD-10-CM | POA: Insufficient documentation

## 2014-12-14 DIAGNOSIS — R05 Cough: Secondary | ICD-10-CM | POA: Diagnosis present

## 2014-12-14 MED ORDER — AMOXICILLIN-POT CLAVULANATE 875-125 MG PO TABS: 1.0000 | ORAL_TABLET | Freq: Two times a day (BID) | ORAL | Status: DC

## 2014-12-14 NOTE — ED Provider Notes (Signed)
CSN: [id number]     Arrival date & time 12/14/14  1443 History   First MD Initiated Contact with Patient 12/14/14 1507     Chief Complaint  Patient presents with  . Cough  . Generalized Body Aches     (Consider location/radiation/quality/duration/timing/severity/associated sxs/prior Treatment) HPI Comments: She is a 49 year old woman here for evaluation of sinus pressure. She states she had sinus issues about a week or so ago. She took some over-the-counter medicine and this resolved. Then, 3 days ago she developed gradually worsening cough, sinus pressure, and weakness. This got worse last night. She reports nasal discharge. She also reports a sore throat and hoarseness. Cough is nonproductive. She denies any shortness of breath. She's been using her albuterol inhaler with some improvement. No fevers, but she does report some chills.  Patient is a 49 y.o. female presenting with cough. The history is provided by the patient.  Cough Cough characteristics:  Non-productive Severity:  Moderate Onset quality:  Sudden Duration:  3 days Timing:  Constant Progression:  Unchanged Chronicity:  New Context: upper respiratory infection   Relieved by:  Beta-agonist inhaler Associated symptoms: chills, headaches, rhinorrhea, sinus congestion and sore throat   Associated symptoms: no ear pain, no fever, no shortness of breath and no wheezing   Headaches:    Onset quality:  Gradual   Duration:  1 day   Timing:  Constant   Chronicity:  New   Past Medical History  Diagnosis Date  . Asthma 2004  . GERD (gastroesophageal reflux disease) 2008  . Epilepsy in 1972    No seizures since 1972. Previously treated with phenobarbital.   . Hypothyroidism 2008  . Seasonal allergies 2003  . Headache(784.0)     hx of migraines   . Arthritis     bursitis left hip flares-not an issue  . Edentulous     10-19-13 at present  . Shortness of breath   . Lower esophageal ring 08/18/2013   Past Surgical History   Procedure Laterality Date  . Novasure ablation    . Shoulder arthroscopy Right 2011  . Cesarean section      x2  . Wisdom tooth extraction    . Ventral hernia repair N/A 06/30/2012    Procedure: LAPAROSCOPIC VENTRAL HERNIA;  Surgeon: Shann Medal, MD;  Location: [facility];  Service: General;  Laterality: N/A;  With Mesh  . Umbilical hernia repair N/A 06/30/2012    Procedure: remove umbilicus;  Surgeon: Shann Medal, MD;  Location: [facility];  Service: General;  Laterality: N/A;  . Liver biopsy  06/30/2012    Procedure: LIVER BIOPSY;  Surgeon: Shann Medal, MD;  Location: [facility];  Service: General;;  . Dental surgery      multiple extractions 3'15  . Esophagogastroduodenoscopy N/A 10/24/2013    Procedure: ESOPHAGOGASTRODUODENOSCOPY (EGD);  Surgeon: Gatha Mayer, MD;  Location: [facility];  Service: Endoscopy;  Laterality: N/A;  . Balloon dilation N/A 10/24/2013    Procedure: BALLOON DILATION;  Surgeon: Gatha Mayer, MD;  Location: [facility];  Service: Endoscopy;  Laterality: N/A;   Family History  Problem Relation Age of Onset  . Hypertension Mother   . Diabetes Mother   . Lung cancer Father   . Stroke Father   . Diabetes Sister   . Hypertension Sister    History  Substance Use Topics  . Smoking status: Never Smoker   . Smokeless tobacco: Never Used  . Alcohol Use: 0.0 oz/week   OB History  No data available     Review of Systems  Constitutional: Positive for chills. Negative for fever.  HENT: Positive for rhinorrhea and sore throat. Negative for ear pain.   Respiratory: Positive for cough. Negative for shortness of breath and wheezing.   Neurological: Positive for headaches.      Allergies  Aspirin  Home Medications   Prior to Admission medications   Medication Sig Start Date End Date Taking? Authorizing Provider  albuterol (PROVENTIL HFA;VENTOLIN HFA) 108 (90 BASE) MCG/ACT inhaler Inhale 2 puffs into the lungs every 6 (six) hours as needed for wheezing  or shortness of breath.    Historical Provider, MD  albuterol (PROVENTIL) (2.5 MG/3ML) 0.083% nebulizer solution Take 3 mLs (2.5 mg total) by nebulization every 6 (six) hours as needed for wheezing or shortness of breath. 06/11/14   Mariel Aloe, MD  amoxicillin-clavulanate (AUGMENTIN) 875-125 MG per tablet Take 1 tablet by mouth 2 (two) times daily. 12/14/14   Melony Overly, MD  budesonide-formoterol (SYMBICORT) 160-4.5 MCG/ACT inhaler Inhale 2 puffs into the lungs 2 (two) times daily.    Historical Provider, MD  fluticasone (FLONASE) 50 MCG/ACT nasal spray Place 2 sprays into both nostrils daily.    Historical Provider, MD  HYDROcodone-acetaminophen (NORCO/VICODIN) 5-325 MG per tablet Take 1 tablet by mouth every 6 (six) hours as needed for moderate pain. 07/08/14   Domenic Moras, PA-C  levothyroxine (SYNTHROID, LEVOTHROID) 300 MCG tablet Take 300 mcg by mouth daily before breakfast.    Historical Provider, MD  levothyroxine (SYNTHROID, LEVOTHROID) 300 MCG tablet TAKE 1 TABLET BY MOUTH ONCE DAILY 06/11/14   [place] N Rumley, DO  montelukast (SINGULAIR) 10 MG tablet Take 10 mg by mouth daily.    Historical Provider, MD  omeprazole (PRILOSEC) 40 MG capsule Take 1 capsule (40 mg total) by mouth daily. 01/11/14   Gatha Mayer, MD  predniSONE (DELTASONE) 50 MG tablet Take 1 tablet (50 mg total) by mouth daily with breakfast. 06/11/14   Mariel Aloe, MD  sulfamethoxazole-trimethoprim (BACTRIM DS,SEPTRA DS) 800-160 MG per tablet Take 2 tablets by mouth 2 (two) times daily. 07/10/14   Willeen Niece, MD  SYMBICORT 160-4.5 MCG/ACT inhaler INHALE 2 PUFFS INTO THE LUNGS 2 TIMES DAILY. 06/29/14   [place] N Rumley, DO   BP 124/83 mmHg  Pulse 88  Temp(Src) 98.8 F (37.1 C) (Oral)  Resp 18  SpO2 96% Physical Exam  Constitutional: She is oriented to person, place, and time. She appears well-developed and well-nourished. No distress.  HENT:  Mouth/Throat: No oropharyngeal exudate.  Mild pharyngeal erythema.  Nasal  mucosa is erythematous. TMs normal bilaterally.  Neck: Neck supple.  Cardiovascular: Normal rate, regular rhythm and normal heart sounds.   No murmur heard. Pulmonary/Chest: Effort normal and breath sounds normal. No respiratory distress. She has no wheezes.  Lymphadenopathy:    She has no cervical adenopathy.  Neurological: She is alert and oriented to person, place, and time.    ED Course  Procedures (including critical care time) Labs Review Labs Reviewed - No data to display  Imaging Review No results found.   EKG Interpretation None      MDM   Final diagnoses:  Acute pansinusitis, recurrence not specified    I suspect her symptoms are caused by sinusitis. No findings on exam or hypoxia to suggest pneumonia. We'll treat with Augmentin for 10 days. Recommended nasal saline spray. Follow-up as needed.    Melony Overly, MD 12/14/14 [phone]

## 2014-12-14 NOTE — ED Notes (Signed)
Patient alert and oriented x 3 ambulating without difficulty.

## 2014-12-14 NOTE — Discharge Instructions (Signed)
Sinusitis Sinusitis is redness, soreness, and puffiness (inflammation) of the air pockets in the bones of your face (sinuses). The redness, soreness, and puffiness can cause air and mucus to get trapped in your sinuses. This can allow germs to grow and cause an infection.  HOME CARE   Drink enough fluids to keep your pee (urine) clear or pale yellow.  Use a humidifier in your home.  Run a hot shower to create steam in the bathroom. Sit in the bathroom with the door closed. Breathe in the steam 3-4 times a day.  Put a warm, moist washcloth on your face 3-4 times a day, or as told by your doctor.  Use salt water sprays (saline sprays) to wet the thick fluid in your nose. This can help the sinuses drain.  Only take medicine as told by your doctor. GET HELP RIGHT AWAY IF:   Your pain gets worse.  You have very bad headaches.  You are sick to your stomach (nauseous).  You throw up (vomit).  You are very sleepy (drowsy) all the time.  Your face is puffy (swollen).  Your vision changes.  You have a stiff neck.  You have trouble breathing. MAKE SURE YOU:   Understand these instructions.  Will watch your condition.  Will get help right away if you are not doing well or get worse. Document Released: 10/14/2007 Document Revised: 01/20/2012 Document Reviewed: 12/01/2011 [facility] Patient Information 2015 [place], [place]. This information is not intended to replace advice given to you by your health care provider. Make sure you discuss any questions you have with your health care provider.

## 2014-12-14 NOTE — ED Notes (Signed)
Pt sts nasal congestion, cough and body aches x 3 days

## 2015-02-19 ENCOUNTER — Encounter: Payer: Medicaid Other | Admitting: Family Medicine

## 2015-02-22 ENCOUNTER — Encounter: Payer: Medicaid Other | Admitting: Family Medicine

## 2015-04-05 ENCOUNTER — Other Ambulatory Visit: Payer: Self-pay

## 2015-04-05 ENCOUNTER — Emergency Department (HOSPITAL_COMMUNITY)
Admission: EM | Admit: 2015-04-05 | Discharge: 2015-04-05 | Disposition: A | Discharge status: Home / Self Care | Payer: Medicaid Other | Attending: Emergency Medicine | Admitting: Emergency Medicine

## 2015-04-05 ENCOUNTER — Encounter (HOSPITAL_COMMUNITY): Payer: Self-pay | Admitting: *Deleted

## 2015-04-05 ENCOUNTER — Emergency Department (HOSPITAL_COMMUNITY): Payer: Medicaid Other

## 2015-04-05 DIAGNOSIS — Q393 Congenital stenosis and stricture of esophagus: Secondary | ICD-10-CM | POA: Insufficient documentation

## 2015-04-05 DIAGNOSIS — Z8669 Personal history of other diseases of the nervous system and sense organs: Secondary | ICD-10-CM | POA: Diagnosis not present

## 2015-04-05 DIAGNOSIS — Z8679 Personal history of other diseases of the circulatory system: Secondary | ICD-10-CM | POA: Insufficient documentation

## 2015-04-05 DIAGNOSIS — R05 Cough: Secondary | ICD-10-CM | POA: Diagnosis present

## 2015-04-05 DIAGNOSIS — Z79899 Other long term (current) drug therapy: Secondary | ICD-10-CM | POA: Diagnosis not present

## 2015-04-05 DIAGNOSIS — M1990 Unspecified osteoarthritis, unspecified site: Secondary | ICD-10-CM | POA: Insufficient documentation

## 2015-04-05 DIAGNOSIS — Z792 Long term (current) use of antibiotics: Secondary | ICD-10-CM | POA: Diagnosis not present

## 2015-04-05 DIAGNOSIS — Z7951 Long term (current) use of inhaled steroids: Secondary | ICD-10-CM | POA: Diagnosis not present

## 2015-04-05 DIAGNOSIS — E039 Hypothyroidism, unspecified: Secondary | ICD-10-CM | POA: Insufficient documentation

## 2015-04-05 DIAGNOSIS — J45901 Unspecified asthma with (acute) exacerbation: Secondary | ICD-10-CM | POA: Diagnosis not present

## 2015-04-05 DIAGNOSIS — Z7952 Long term (current) use of systemic steroids: Secondary | ICD-10-CM | POA: Diagnosis not present

## 2015-04-05 DIAGNOSIS — K219 Gastro-esophageal reflux disease without esophagitis: Secondary | ICD-10-CM | POA: Insufficient documentation

## 2015-04-05 MED ORDER — ALBUTEROL SULFATE HFA 108 (90 BASE) MCG/ACT IN AERS: 2.0000 | INHALATION_SPRAY | Freq: Four times a day (QID) | RESPIRATORY_TRACT | Status: DC | PRN

## 2015-04-05 MED ORDER — ALBUTEROL SULFATE (2.5 MG/3ML) 0.083% IN NEBU
INHALATION_SOLUTION | RESPIRATORY_TRACT | Status: AC
  Filled 2015-04-05: qty 6

## 2015-04-05 MED ORDER — ALBUTEROL SULFATE HFA 108 (90 BASE) MCG/ACT IN AERS
4.0000 | INHALATION_SPRAY | Freq: Once | RESPIRATORY_TRACT | Status: AC
Start: 2015-04-05 — End: 2015-04-05
  Administered 2015-04-05: 4 via RESPIRATORY_TRACT
  Filled 2015-04-05: qty 6.7

## 2015-04-05 MED ORDER — PREDNISONE 20 MG PO TABS
60.0000 mg | ORAL_TABLET | Freq: Once | ORAL | Status: AC
  Administered 2015-04-05: 60 mg via ORAL
  Filled 2015-04-05: qty 3

## 2015-04-05 MED ORDER — ALBUTEROL SULFATE (2.5 MG/3ML) 0.083% IN NEBU
5.0000 mg | INHALATION_SOLUTION | Freq: Once | RESPIRATORY_TRACT | Status: AC
  Administered 2015-04-05: 5 mg via RESPIRATORY_TRACT

## 2015-04-05 MED ORDER — PREDNISONE 10 MG PO TABS: 40.0000 mg | ORAL_TABLET | Freq: Every day | ORAL | Status: DC

## 2015-04-05 MED ORDER — BUDESONIDE-FORMOTEROL FUMARATE 160-4.5 MCG/ACT IN AERO: 2.0000 | INHALATION_SPRAY | Freq: Two times a day (BID) | RESPIRATORY_TRACT | Status: DC

## 2015-04-05 NOTE — ED Provider Notes (Signed)
CSN: [id number]     Arrival date & time 04/05/15  1124 History   None    Chief Complaint  Patient presents with  . Asthma  . Cough  . Nasal Congestion    Patient is a 49 y.o. female presenting with shortness of breath. The history is provided by the patient.  Shortness of Breath Severity:  Mild Onset quality:  Gradual Duration:  2 days Timing:  Intermittent Chronicity:  New Relieved by:  Nothing Worsened by:  Exertion Ineffective treatments: albuterol neb. Associated symptoms: cough   Associated symptoms: no abdominal pain, no chest pain, no ear pain, no fever, no headaches, no neck pain, no rash, no sore throat, no sputum production and no vomiting     Past Medical History  Diagnosis Date  . Asthma 2004  . GERD (gastroesophageal reflux disease) 2008  . Epilepsy ([place]) in 1972    No seizures since 1972. Previously treated with phenobarbital.   . Hypothyroidism 2008  . Seasonal allergies 2003  . Headache(784.0)     hx of migraines   . Arthritis     bursitis left hip flares-not an issue  . Edentulous     10-19-13 at present  . Shortness of breath   . Lower esophageal ring 08/18/2013   Past Surgical History  Procedure Laterality Date  . Novasure ablation    . Shoulder arthroscopy Right 2011  . Cesarean section      x2  . Wisdom tooth extraction    . Ventral hernia repair N/A 06/30/2012    Procedure: LAPAROSCOPIC VENTRAL HERNIA;  Surgeon: Shann Medal, MD;  Location: [facility];  Service: General;  Laterality: N/A;  With Mesh  . Umbilical hernia repair N/A 06/30/2012    Procedure: remove umbilicus;  Surgeon: Shann Medal, MD;  Location: [facility];  Service: General;  Laterality: N/A;  . Liver biopsy  06/30/2012    Procedure: LIVER BIOPSY;  Surgeon: Shann Medal, MD;  Location: [facility];  Service: General;;  . Dental surgery      multiple extractions 3'15  . Esophagogastroduodenoscopy N/A 10/24/2013    Procedure: ESOPHAGOGASTRODUODENOSCOPY (EGD);  Surgeon: Gatha Mayer,  MD;  Location: [facility];  Service: Endoscopy;  Laterality: N/A;  . Balloon dilation N/A 10/24/2013    Procedure: BALLOON DILATION;  Surgeon: Gatha Mayer, MD;  Location: [facility];  Service: Endoscopy;  Laterality: N/A;   Family History  Problem Relation Age of Onset  . Hypertension Mother   . Diabetes Mother   . Lung cancer Father   . Stroke Father   . Diabetes Sister   . Hypertension Sister    Social History  Substance Use Topics  . Smoking status: Never Smoker   . Smokeless tobacco: Never Used  . Alcohol Use: 0.0 oz/week   OB History    No data available     Review of Systems  Constitutional: Negative for fever.  HENT: Positive for congestion. Negative for ear pain, facial swelling, rhinorrhea, sore throat and trouble swallowing.   Eyes: Negative for visual disturbance.  Respiratory: Positive for cough and shortness of breath. Negative for sputum production and chest tightness.   Cardiovascular: Negative for chest pain and palpitations.  Gastrointestinal: Negative for nausea, vomiting, abdominal pain and constipation.  Genitourinary: Negative for dysuria and hematuria.  Musculoskeletal: Negative for back pain and neck pain.  Skin: Negative for rash.  Neurological: Negative for dizziness and headaches.  Psychiatric/Behavioral: Negative for confusion.  All other systems reviewed and are  negative.  Allergies  Aspirin  Home Medications   Prior to Admission medications   Medication Sig Start Date End Date Taking? Authorizing Provider  albuterol (PROVENTIL HFA;VENTOLIN HFA) 108 (90 BASE) MCG/ACT inhaler Inhale 2 puffs into the lungs every 6 (six) hours as needed for wheezing or shortness of breath. 04/05/15   Gustavus Bryant, MD  albuterol (PROVENTIL) (2.5 MG/3ML) 0.083% nebulizer solution Take 3 mLs (2.5 mg total) by nebulization every 6 (six) hours as needed for wheezing or shortness of breath. 06/11/14   Mariel Aloe, MD  amoxicillin-clavulanate (AUGMENTIN) 875-125  MG per tablet Take 1 tablet by mouth 2 (two) times daily. 12/14/14   Melony Overly, MD  budesonide-formoterol (SYMBICORT) 160-4.5 MCG/ACT inhaler Inhale 2 puffs into the lungs 2 (two) times daily. 04/05/15   Gustavus Bryant, MD  fluticasone (FLONASE) 50 MCG/ACT nasal spray Place 2 sprays into both nostrils daily.    Historical Provider, MD  HYDROcodone-acetaminophen (NORCO/VICODIN) 5-325 MG per tablet Take 1 tablet by mouth every 6 (six) hours as needed for moderate pain. 07/08/14   Domenic Moras, PA-C  levothyroxine (SYNTHROID, LEVOTHROID) 300 MCG tablet Take 300 mcg by mouth daily before breakfast.    Historical Provider, MD  levothyroxine (SYNTHROID, LEVOTHROID) 300 MCG tablet TAKE 1 TABLET BY MOUTH ONCE DAILY 06/11/14   [place] N Rumley, DO  montelukast (SINGULAIR) 10 MG tablet Take 10 mg by mouth daily.    Historical Provider, MD  omeprazole (PRILOSEC) 40 MG capsule Take 1 capsule (40 mg total) by mouth daily. 01/11/14   Gatha Mayer, MD  predniSONE (DELTASONE) 10 MG tablet Take 4 tablets (40 mg total) by mouth daily. 04/05/15   Gustavus Bryant, MD  predniSONE (DELTASONE) 50 MG tablet Take 1 tablet (50 mg total) by mouth daily with breakfast. 06/11/14   Mariel Aloe, MD  sulfamethoxazole-trimethoprim (BACTRIM DS,SEPTRA DS) 800-160 MG per tablet Take 2 tablets by mouth 2 (two) times daily. 07/10/14   Willeen Niece, MD  SYMBICORT 160-4.5 MCG/ACT inhaler INHALE 2 PUFFS INTO THE LUNGS 2 TIMES DAILY. 06/29/14   [place] N Rumley, DO   BP 113/80 mmHg  Pulse 78  Temp(Src) 98.9 F (37.2 C) (Oral)  Resp 20  SpO2 94% Physical Exam  Constitutional: She is oriented to person, place, and time. She appears well-developed and well-nourished. No distress.  HENT:  Head: Normocephalic and atraumatic.  Mouth/Throat: Oropharynx is clear and moist.  Eyes: EOM are normal. Pupils are equal, round, and reactive to light.  Neck: Neck supple. No JVD present.  Cardiovascular: Normal rate, regular rhythm, normal heart sounds and  intact distal pulses.  Exam reveals no gallop.   No murmur heard. Pulmonary/Chest: Effort normal and breath sounds normal. No accessory muscle usage. No tachypnea. She has no decreased breath sounds. She has no wheezes. She has no rales.  Abdominal: Soft. She exhibits no distension. There is no tenderness.  Musculoskeletal: Normal range of motion. She exhibits no tenderness.  Neurological: She is alert and oriented to person, place, and time. No cranial nerve deficit. She exhibits normal muscle tone.  Skin: Skin is warm and dry. No rash noted.  Psychiatric: Her behavior is normal.    ED Course  Procedures  None   Labs Review Labs Reviewed - No data to display  Imaging Review Dg Chest 2 View  04/05/2015  CLINICAL DATA:  Productive cough. EXAM: CHEST  2 VIEW COMPARISON:  Comparison made to prior study of 02/08/2014. FINDINGS: Mediastinum and hilar structures normal. Persistent bibasilar  subsegmental atelectasis and/or scarring. No pleural effusion or pneumothorax. Heart size normal. No acute bony abnormality . IMPRESSION: Persistent changes of bibasilar subsegmental atelectasis and/or scarring. No acute cardiopulmonary disease. Electronically Signed   By: Marcello Moores  Register   On: 04/05/2015 13:05   I have personally reviewed and evaluated these images and lab results as part of my medical decision-making.   EKG Interpretation   Date/Time:  Friday April 05 2015 12:24:38 EST Ventricular Rate:  82 PR Interval:  158 QRS Duration: 84 QT Interval:  384 QTC Calculation: 448 R Axis:   38 Text Interpretation:  Poor data quality, interpretation may be  adversely affected Normal sinus rhythm Cannot rule out Anterior infarct ,  age undetermined Abnormal ECG t wave inversion in III new from oct 2015  \E\ Confirmed by [facility] MD, Corene Cornea [phone]) on 04/05/2015 3:12:02 PM      MDM   Final diagnoses:  Asthma exacerbation    49 yo F with a PMH of asthma presents with 1 week of URI symptoms  and nasal congestion and 2 days of cough. She felt she was having difficulty breathing today at work (she works at [employer]). No productive sputum, chest pain or fever. Normoxic here. Received albuterol nebs and feels better now. EKG without acute ischemic changes, NSR, rate 82, intervals maintained. CXR with chronic changes, no focal infiltrate. Will provide MDI and prednisone as well as refill Rx for albuterol and symbicort and prednisone burst. Stable for discharge.  Discussed with Dr. Dayna Barker.  Gustavus Bryant, MD 04/05/15 1539  Merrily Pew, MD 04/07/15 8527

## 2015-04-05 NOTE — ED Provider Notes (Signed)
I saw and evaluated the patient, reviewed the resident's note and I agree with the findings and plan.  49  Yo F here with likely asthma exacerbation. On my exam, no distress, lungs clear with minimal end expiratory wheezing. cxr ok. ECG with new t wave inversion, but only one lead, no CP, exertional symptoms, nausea or other e/o myocardial ischemia. Plan to tx as asthma exacerbation.    EKG Interpretation   Date/Time:  Friday April 05 2015 12:24:38 EST Ventricular Rate:  82 PR Interval:  158 QRS Duration: 84 QT Interval:  384 QTC Calculation: 448 R Axis:   38 Text Interpretation:  Poor data quality, interpretation may be  adversely affected Normal sinus rhythm Cannot rule out Anterior infarct ,  age undetermined Abnormal ECG t wave inversion in III new from oct 2015  \E\ Confirmed by [facility] MD, Allene Furuya [phone]) on 04/05/2015 3:12:02 PM        Merrily Pew, MD 04/07/15 1423

## 2015-04-05 NOTE — Discharge Instructions (Signed)
Asthma, Acute Bronchospasm Acute bronchospasm caused by asthma is also referred to as an asthma attack. Bronchospasm means your air passages become narrowed. The narrowing is caused by inflammation and tightening of the muscles in the air tubes (bronchi) in your lungs. This can make it hard to breathe or cause you to wheeze and cough. CAUSES Possible triggers are:  Animal dander from the skin, hair, or feathers of animals.  Dust mites contained in house dust.  Cockroaches.  Pollen from trees or grass.  Mold.  Cigarette or tobacco smoke.  Air pollutants such as dust, household cleaners, hair sprays, aerosol sprays, paint fumes, strong chemicals, or strong odors.  Cold air or weather changes. Cold air may trigger inflammation. Winds increase molds and pollens in the air.  Strong emotions such as crying or laughing hard.  Stress.  Certain medicines such as aspirin or beta-blockers.  Sulfites in foods and drinks, such as dried fruits and wine.  Infections or inflammatory conditions, such as a flu, cold, or inflammation of the nasal membranes (rhinitis).  Gastroesophageal reflux disease (GERD). GERD is a condition where stomach acid backs up into your esophagus.  Exercise or strenuous activity. SIGNS AND SYMPTOMS   Wheezing.  Excessive coughing, particularly at night.  Chest tightness.  Shortness of breath. DIAGNOSIS  Your health care provider will ask you about your medical history and perform a physical exam. A chest X-ray or blood testing may be performed to look for other causes of your symptoms or other conditions that may have triggered your asthma attack. TREATMENT  Treatment is aimed at reducing inflammation and opening up the airways in your lungs. Most asthma attacks are treated with inhaled medicines. These include quick relief or rescue medicines (such as bronchodilators) and controller medicines (such as inhaled corticosteroids). These medicines are sometimes  given through an inhaler or a nebulizer. Systemic steroid medicine taken by mouth or given through an IV tube also can be used to reduce the inflammation when an attack is moderate or severe. Antibiotic medicines are only used if a bacterial infection is present.  HOME CARE INSTRUCTIONS   Rest.  Drink plenty of liquids. This helps the mucus to remain thin and be easily coughed up. Only use caffeine in moderation and do not use alcohol until you have recovered from your illness.  Do not smoke. Avoid being exposed to secondhand smoke.  You play a critical role in keeping yourself in good health. Avoid exposure to things that cause you to wheeze or to have breathing problems.  Keep your medicines up-to-date and available. Carefully follow your health care provider's treatment plan.  Take your medicine exactly as prescribed.  When pollen or pollution is bad, keep windows closed and use an air conditioner or go to places with air conditioning.  Asthma requires careful medical care. See your health care provider for a follow-up as advised. If you are more than [redacted] weeks pregnant and you were prescribed any new medicines, let your obstetrician know about the visit and how you are doing. Follow up with your health care provider as directed.  After you have recovered from your asthma attack, make an appointment with your outpatient doctor to talk about ways to reduce the likelihood of future attacks. If you do not have a doctor who manages your asthma, make an appointment with a primary care doctor to discuss your asthma. SEEK IMMEDIATE MEDICAL CARE IF:   You are getting worse.  You have trouble breathing. If severe, call your local  emergency services (911 in the [place]  You develop chest pain or discomfort.  You are vomiting.  You are not able to keep fluids down.  You are coughing up yellow, green, brown, or bloody sputum.  You have a fever and your symptoms suddenly get worse.  You have  trouble swallowing. MAKE SURE YOU:   Understand these instructions.  Will watch your condition.  Will get help right away if you are not doing well or get worse.   This information is not intended to replace advice given to you by your health care provider. Make sure you discuss any questions you have with your health care provider.   Document Released: 08/12/2006 Document Revised: 05/02/2013 Document Reviewed: 11/02/2012 Elsevier Interactive Patient Education 2016 [place] Prevention While you may not be able to control the fact that you have asthma, you can take actions to prevent asthma attacks. The best way to prevent asthma attacks is to maintain good control of your asthma. You can achieve this by:  Taking your medicines as directed.  Avoiding things that can irritate your airways or make your asthma symptoms worse (asthma triggers).  Keeping track of how well your asthma is controlled and of any changes in your symptoms.  Responding quickly to worsening asthma symptoms (asthma attack).  Seeking emergency care when it is needed. WHAT ARE SOME WAYS TO PREVENT AN ASTHMA ATTACK? Have a Plan Work with your health care provider to create a written plan for managing and treating your asthma attacks (asthma action plan). This plan includes:  A list of your asthma triggers and how you can avoid them.  Information on when medicines should be taken and when their dosages should be changed.  The use of a device that measures how well your lungs are working (peak flow meter). Monitor Your Asthma Use your peak flow meter and record your results in a journal every day. A drop in your peak flow numbers on one or more days may indicate the start of an asthma attack. This can happen even before you start to feel symptoms. You can prevent an asthma attack from getting worse by following the steps in your asthma action plan. Avoid Asthma Triggers Work with your asthma  health care provider to find out what your asthma triggers are. This can be done by:  Allergy testing.  Keeping a journal that notes when asthma attacks occur and the factors that may have contributed to them.  Determining if there are other medical conditions that are making your asthma worse. Once you have determined your asthma triggers, take steps to avoid them. This may include avoiding excessive or prolonged exposure to:  Dust. Have someone dust and vacuum your home for you once or twice a week. Using a high-efficiency particulate arrestance (HEPA) vacuum is best.  Smoke. This includes campfire smoke, forest fire smoke, and secondhand smoke from tobacco products.  Pet dander. Avoid contact with animals that you know you are allergic to.  Allergens from trees, grasses or pollens. Avoid spending a lot of time outdoors when pollen counts are high, and on very windy days.  Very cold, dry, or humid air.  Mold.  Foods that contain high amounts of sulfites.  Strong odors.  Outdoor air pollutants, such as Lexicographer.  Indoor air pollutants, such as aerosol sprays and fumes from household cleaners.  Household pests, including dust mites and cockroaches, and pest droppings.  Certain medicines, including NSAIDs. Always talk to your health  care provider before stopping or starting any new medicines. Medicines Take over-the-counter and prescription medicines only as told by your health care provider. Many asthma attacks can be prevented by carefully following your medicine schedule. Taking your medicines correctly is especially important when you cannot avoid certain asthma triggers. Act Quickly If an asthma attack does happen, acting quickly can decrease how severe it is and how long it lasts. Take these steps:   Pay attention to your symptoms. If you are coughing, wheezing, or having difficulty breathing, do not wait to see if your symptoms go away on their own. Follow your asthma  action plan.  If you have followed your asthma action plan and your symptoms are not improving, call your health care provider or seek immediate medical care at the nearest hospital. It is important to note how often you need to use your fast-acting rescue inhaler. If you are using your rescue inhaler more often, it may mean that your asthma is not under control. Adjusting your asthma treatment plan may help you to prevent future asthma attacks and help you to gain better control of your condition. HOW CAN I PREVENT AN ASTHMA ATTACK WHEN I EXERCISE? Follow advice from your health care provider about whether you should use your fast-acting inhaler before exercising. Many people with asthma experience exercise-induced bronchoconstriction (EIB). This condition often worsens during vigorous exercise in cold, humid, or dry environments. Usually, people with EIB can stay very active by pre-treating with a fast-acting inhaler before exercising.   This information is not intended to replace advice given to you by your health care provider. Make sure you discuss any questions you have with your health care provider.   Document Released: 04/15/2009 Document Revised: 01/16/2015 Document Reviewed: 09/27/2014 Elsevier Interactive Patient Education [employer].

## 2015-04-05 NOTE — ED Notes (Signed)
Patient presents to ed c/o sob , states she has been sob with a cough for several days . States when she is lying around she still feels  However when up walking states she gets really sob. Patient is able to speak in complete sentences.

## 2015-04-05 NOTE — ED Notes (Signed)
PT states that she has been sick for a week with nasal congestion, cough, and her asthma started bothering her bad.  Pt states she was trying to work and felt like she could not catch her breath.  Pt has pressure in chest.

## 2015-04-05 NOTE — ED Notes (Signed)
The pt has had a cold for the past 3-4 days with sinus drainage headache.  Today at work she had morte difficulty breathing  NON\E now.  Sl pain with breathing

## 2015-04-26 ENCOUNTER — Ambulatory Visit (INDEPENDENT_AMBULATORY_CARE_PROVIDER_SITE_OTHER): Payer: Medicaid Other | Admitting: Family Medicine

## 2015-04-26 ENCOUNTER — Encounter: Payer: Self-pay | Admitting: Family Medicine

## 2015-04-26 ENCOUNTER — Ambulatory Visit: Payer: Medicaid Other | Admitting: Family Medicine

## 2015-04-26 ENCOUNTER — Other Ambulatory Visit: Payer: Self-pay | Admitting: Family Medicine

## 2015-04-26 ENCOUNTER — Other Ambulatory Visit: Payer: Self-pay | Admitting: Internal Medicine

## 2015-04-26 VITALS — BP 110/73 | HR 68 | Temp 98.5°F | Ht 66.0 in | Wt 247.6 lb

## 2015-04-26 DIAGNOSIS — J069 Acute upper respiratory infection, unspecified: Secondary | ICD-10-CM

## 2015-04-26 DIAGNOSIS — J45901 Unspecified asthma with (acute) exacerbation: Secondary | ICD-10-CM | POA: Diagnosis not present

## 2015-04-26 DIAGNOSIS — B9789 Other viral agents as the cause of diseases classified elsewhere: Principal | ICD-10-CM

## 2015-04-26 MED ORDER — BENZONATATE 100 MG PO CAPS: 100.0000 mg | ORAL_CAPSULE | Freq: Two times a day (BID) | ORAL | Status: DC | PRN

## 2015-04-26 NOTE — Progress Notes (Signed)
   Subjective:    Patient ID: Holly Mueller, female    DOB: 09-Aug-1965, 49 y.o.   MRN: [id number]  HPI  Patient presents for Same Day Appointment  CC: asthma  # Asthma/URI/cough:  Buddy Duty about 1 week ago, but says has had issues off/on for past 2 months.  Went to ED about 3 weeks ago and was given breathing treatments and prednisone (which she has finished)  Feels like she has developed another cold with runny nose, now going down into her chest with persistent cough that keeps her up at night  Has had some shortness of breath with wheezing particularly at work when she overexerts herself, but is able to rest and take a break to catch her breath.  She has been taking her albuterol inhaler, mucinex. It is not immediately clear if she has been taking her symbicort since she says it was "old" and she needs refills.  She has not gotten a flu shot this year ROS: no current wheezing or SOB, no CP  Social Hx: never smoker  Review of Systems   See HPI for ROS.   Past medical history, surgical, family, and social history reviewed and updated in the EMR as appropriate.  Objective:  BP 110/73 mmHg  Pulse 68  Temp(Src) 98.5 F (36.9 C) (Oral)  Ht _0  (1.676 m)  Wt 247 lb 9.6 oz (112.311 kg)  BMI 39.98 kg/m2 Vitals and nursing note reviewed  General: NAD ENTM: normal TMs bilaterally. No rhinorrhea noted.  CV: RRR, normal s1s2 no murmurs, rubs or gallop. Resp: clear to auscultation bilaterally, no wheezes, rhonchi or crackles appreciated. Normal effort and resp rate  Assessment & Plan:   1. Viral URI with cough See AVS sheet. Do not feel antibiotics are indicated at this time. Instructed she had refills on symbicort sent from the ED. She says tessalon has helped her cough well in the past so will send this in for her. Return precautions for worsening breathing. Follow up as needed.

## 2015-04-26 NOTE — Patient Instructions (Signed)
Your symptoms are due to a viral illness. Antibiotics will not help improve your symptoms, but the following will help you feel better while your body fights the virus.   Stay hydrated - drink a lot of water   Nasal Saline Spray  Congestion:   Nose spray: Afrin (Phenylephrine). DO NOT USE MORE THAN 3 DAYS  Oral: Pseudoephedrine  Sneezing & Runny nose: Cetirizine (Zyrtec), Fexofenadine (Allegra), Loratadine (Claritin)  Pain/Sore throat: Tylenol, Ibuprofen  Cough: Dextromethorphan, (Guaifenesin, "Mucinex") & Albuterol  Wash your hands often to prevent spreading the virus

## 2015-04-30 ENCOUNTER — Ambulatory Visit: Payer: Medicaid Other | Admitting: Family Medicine

## 2015-06-14 ENCOUNTER — Ambulatory Visit: Payer: Medicaid Other | Admitting: Family Medicine

## 2015-06-15 ENCOUNTER — Emergency Department (HOSPITAL_COMMUNITY)
Admission: EM | Admit: 2015-06-15 | Discharge: 2015-06-15 | Disposition: A | Discharge status: Home / Self Care | Payer: Medicaid Other | Attending: Emergency Medicine | Admitting: Emergency Medicine

## 2015-06-15 ENCOUNTER — Encounter (HOSPITAL_COMMUNITY): Payer: Self-pay | Admitting: *Deleted

## 2015-06-15 DIAGNOSIS — J45909 Unspecified asthma, uncomplicated: Secondary | ICD-10-CM | POA: Insufficient documentation

## 2015-06-15 DIAGNOSIS — M79645 Pain in left finger(s): Secondary | ICD-10-CM | POA: Diagnosis not present

## 2015-06-15 DIAGNOSIS — E039 Hypothyroidism, unspecified: Secondary | ICD-10-CM | POA: Insufficient documentation

## 2015-06-15 DIAGNOSIS — M25532 Pain in left wrist: Secondary | ICD-10-CM | POA: Insufficient documentation

## 2015-06-15 DIAGNOSIS — Q393 Congenital stenosis and stricture of esophagus: Secondary | ICD-10-CM | POA: Insufficient documentation

## 2015-06-15 DIAGNOSIS — M79642 Pain in left hand: Secondary | ICD-10-CM | POA: Insufficient documentation

## 2015-06-15 DIAGNOSIS — Z79899 Other long term (current) drug therapy: Secondary | ICD-10-CM | POA: Insufficient documentation

## 2015-06-15 DIAGNOSIS — Z8669 Personal history of other diseases of the nervous system and sense organs: Secondary | ICD-10-CM | POA: Diagnosis not present

## 2015-06-15 DIAGNOSIS — Z7951 Long term (current) use of inhaled steroids: Secondary | ICD-10-CM | POA: Diagnosis not present

## 2015-06-15 DIAGNOSIS — K219 Gastro-esophageal reflux disease without esophagitis: Secondary | ICD-10-CM | POA: Insufficient documentation

## 2015-06-15 MED ORDER — NAPROXEN 250 MG PO TABS
500.0000 mg | ORAL_TABLET | Freq: Once | ORAL | Status: AC
  Administered 2015-06-15: 500 mg via ORAL
  Filled 2015-06-15: qty 2

## 2015-06-15 MED ORDER — NAPROXEN 500 MG PO TABS: 500.0000 mg | ORAL_TABLET | Freq: Two times a day (BID) | ORAL | Status: DC

## 2015-06-15 MED ORDER — DEXAMETHASONE SODIUM PHOSPHATE 10 MG/ML IJ SOLN
10.0000 mg | Freq: Once | INTRAMUSCULAR | Status: AC
  Administered 2015-06-15: 10 mg via INTRAMUSCULAR
  Filled 2015-06-15: qty 1

## 2015-06-15 MED ORDER — PREDNISONE 20 MG PO TABS: 40.0000 mg | ORAL_TABLET | Freq: Every day | ORAL | Status: DC

## 2015-06-15 NOTE — Progress Notes (Signed)
Orthopedic Tech Progress Note Patient Details:  Holly Mueller December 01, 1965 [id number]  Ortho Devices Type of Ortho Device: Thumb velcro splint Ortho Device/Splint Location: LUE Ortho Device/Splint Interventions: Ordered, Application   Braulio Bosch 06/15/2015, 3:22 PM

## 2015-06-15 NOTE — ED Provider Notes (Signed)
CSN: [id number]     Arrival date & time 06/15/15  1431 History  By signing my name below, I, Rayna Sexton, attest that this documentation has been prepared under the direction and in the presence of Cloyce Blankenhorn Y Walden Statz, [place]. Electronically Signed: Rayna Sexton, ED Scribe. 06/15/2015. 2:56 PM.    Chief Complaint  Patient presents with  . Hand Pain   The history is provided by the patient. No language interpreter was used.    HPI Comments: Holly Mueller is a 50 y.o. female who is right hand dominant presents to the Emergency Department complaining of constant, moderate, left lateral hand and left anterior wrist pain onset 1 month ago. Pt notes applying ice/heat and taking 800 mg of ibuprofen with no significant relief. She works at [employer] and uses her hands frequently throughout the day. She denies any injuries or falls. States it hurts to move her hands and sometimes she feels like she has difficulty gripping things like a pen or grocery bag. Pt denies any numbness, tingling or other associated symptoms at this time.    Past Medical History  Diagnosis Date  . Asthma 2004  . GERD (gastroesophageal reflux disease) 2008  . Epilepsy ([place]) in 1972    No seizures since 1972. Previously treated with phenobarbital.   . Hypothyroidism 2008  . Seasonal allergies 2003  . Headache(784.0)     hx of migraines   . Arthritis     bursitis left hip flares-not an issue  . Edentulous     10-19-13 at present  . Shortness of breath   . Lower esophageal ring 08/18/2013   Past Surgical History  Procedure Laterality Date  . Novasure ablation    . Shoulder arthroscopy Right 2011  . Cesarean section      x2  . Wisdom tooth extraction    . Ventral hernia repair N/A 06/30/2012    Procedure: LAPAROSCOPIC VENTRAL HERNIA;  Surgeon: Shann Medal, MD;  Location: [facility];  Service: General;  Laterality: N/A;  With Mesh  . Umbilical hernia repair N/A 06/30/2012    Procedure: remove umbilicus;  Surgeon: Shann Medal, MD;  Location: [facility];  Service: General;  Laterality: N/A;  . Liver biopsy  06/30/2012    Procedure: LIVER BIOPSY;  Surgeon: Shann Medal, MD;  Location: [facility];  Service: General;;  . Dental surgery      multiple extractions 3'15  . Esophagogastroduodenoscopy N/A 10/24/2013    Procedure: ESOPHAGOGASTRODUODENOSCOPY (EGD);  Surgeon: Gatha Mayer, MD;  Location: [facility];  Service: Endoscopy;  Laterality: N/A;  . Balloon dilation N/A 10/24/2013    Procedure: BALLOON DILATION;  Surgeon: Gatha Mayer, MD;  Location: [facility];  Service: Endoscopy;  Laterality: N/A;   Family History  Problem Relation Age of Onset  . Hypertension Mother   . Diabetes Mother   . Lung cancer Father   . Stroke Father   . Diabetes Sister   . Hypertension Sister    Social History  Substance Use Topics  . Smoking status: Never Smoker   . Smokeless tobacco: Never Used  . Alcohol Use: 0.0 oz/week   OB History    No data available     Review of Systems  All other systems reviewed and are negative.   Allergies  Aspirin  Home Medications   Prior to Admission medications   Medication Sig Start Date End Date Taking? Authorizing Provider  albuterol (PROVENTIL HFA;VENTOLIN HFA) 108 (90 BASE) MCG/ACT inhaler  Inhale 2 puffs into the lungs every 6 (six) hours as needed for wheezing or shortness of breath. 04/05/15   Gustavus Bryant, MD  albuterol (PROVENTIL) (2.5 MG/3ML) 0.083% nebulizer solution Take 3 mLs (2.5 mg total) by nebulization every 6 (six) hours as needed for wheezing or shortness of breath. 06/11/14   Mariel Aloe, MD  benzonatate (TESSALON) 100 MG capsule Take 1-2 capsules (100-200 mg total) by mouth 2 (two) times daily as needed for cough. 04/26/15   Leone Brand, MD  budesonide-formoterol [facility]) 160-4.5 MCG/ACT inhaler Inhale 2 puffs into the lungs 2 (two) times daily. 04/05/15   Gustavus Bryant, MD  fluticasone (FLONASE) 50 MCG/ACT nasal spray Place 2 sprays into both  nostrils daily.    Historical Provider, MD  levothyroxine (SYNTHROID, LEVOTHROID) 300 MCG tablet TAKE 1 TABLET BY MOUTH ONCE DAILY 06/11/14   Rosenberg N Rumley, DO  montelukast (SINGULAIR) 10 MG tablet Take 10 mg by mouth daily.    Historical Provider, MD  omeprazole (PRILOSEC) 40 MG capsule TAKE 1 CAPSULE BY MOUTH DAILY. *TAKE 30 MINUTES BEFORE BREAKFAST* 04/26/15   Gatha Mayer, MD  PROAIR HFA 108 (90 BASE) MCG/ACT inhaler INHALE 2 PUFFS INTO THE LUNGS EVERY 6 HOURS AS NEEDED FOR SHORTNESS OF BREATH 04/26/15   George Mason N Rumley, DO   BP 121/78 mmHg  Pulse 63  Temp(Src) 97.7 F (36.5 C) (Oral)  Resp 17  Ht _0  (1.676 m)  Wt 245 lb (111.131 kg)  BMI 39.56 kg/m2  SpO2 97%    Physical Exam  Constitutional: She is oriented to person, place, and time. She appears well-developed and well-nourished.  HENT:  Head: Normocephalic and atraumatic.  Eyes: EOM are normal.  Neck: Normal range of motion.  Cardiovascular: Normal rate.   Pulmonary/Chest: Effort normal. No respiratory distress.  Abdominal: Soft.  Musculoskeletal: Normal range of motion. She exhibits no edema.  L wrist with FROM, no deformity. No tenderness. No snuffbox tenderness. Negative finklestein's, phalen's, and tinel's test. 2+ radial pulses. 5/5 grip strength bilaterally. Intact finger opposition. Cap refill < 3 sec.   Neurological: She is alert and oriented to person, place, and time.  Skin: Skin is warm and dry.  Psychiatric: She has a normal mood and affect.  Nursing note and vitals reviewed.   ED Course  Procedures  DIAGNOSTIC STUDIES: Oxygen Saturation is 97% on RA, normal by my interpretation.    COORDINATION OF CARE: 2:54 PM Discussed next steps for treatment including naprosyn, decadron and a wrist splint. She understood and is agreeable with the plan.   Labs Review Labs Reviewed - No data to display  Imaging Review No results found.   EKG Interpretation None      MDM   Final diagnoses:  None     Pt has nonfocal exam. However, given description of symptoms most likely inflammation/overuse injury. She is neurovascularly intact. IM decadron and PO naproxen given in the ED. Thumb spica splint given for wrist/thumb rest and compression. Rx given for few days of prednisone and naproxen. Will give referral to ortho prn. ER return precautions given.   I personally performed the services described in this documentation, which was scribed in my presence. The recorded information has been reviewed and is accurate.   Anne Ng, PA-C 06/15/15 1543  Orlie Dakin, MD 06/15/15 [phone]

## 2015-06-15 NOTE — ED Notes (Signed)
Pt placed into gown

## 2015-06-15 NOTE — ED Notes (Signed)
Pt reports a one month Hx of Lt thumb pain that radiates up the ar. Pain increases with movement . Pt reports she has a hard time picking up coins and small tasks such as opening sandwich bags.

## 2015-06-15 NOTE — Discharge Instructions (Signed)
You were seen in the emergency room today for evaluation of left hand, thumb, and wrist pain. Your symptoms are likely due to overuse and inflammation. I will give you a prescription for steroids as well an anti-inflammatory/painkiller. We also gave you a splint to allow your wrist and hand to rest.   Take medications as prescribed. Return to the emergency room for worsening condition or new concerning symptoms. Follow up with your regular doctor. If you don't have a regular doctor use one of the numbers below to establish a primary care doctor.   Emergency Department Resource Guide 1) Find a Doctor and Pay Out of Pocket Although you won't have to find out who is covered by your insurance plan, it is a good idea to ask around and get recommendations. You will then need to call the office and see if the doctor you have chosen will accept you as a new patient and what types of options they offer for patients who are self-pay. Some doctors offer discounts or will set up payment plans for their patients who do not have insurance, but you will need to ask so you aren't surprised when you get to your appointment.  2) Contact Your Local Health Department Not all health departments have doctors that can see patients for sick visits, but many do, so it is worth a call to see if yours does. If you don't know where your local health department is, you can check in your phone book. The CDC also has a tool to help you locate your state's health department, and many state websites also have listings of all of their local health departments.  3) Find a [facility] If your illness is not likely to be very severe or complicated, you may want to try a walk in clinic. These are popping up all over the country in pharmacies, drugstores, and shopping centers. They're usually staffed by nurse practitioners or physician assistants that have been trained to treat common illnesses and complaints. They're usually fairly quick  and inexpensive. However, if you have serious medical issues or chronic medical problems, these are probably not your best option.  No Primary Care Doctor: - Call Health Connect at  [phone] - they can help you locate a primary care doctor that  accepts your insurance, provides certain services, etc. - Physician Referral [phone]  Emergency Department Resource Guide 1) Find a Doctor and Pay Out of Pocket Although you won't have to find out who is covered by your insurance plan, it is a good idea to ask around and get recommendations. You will then need to call the office and see if the doctor you have chosen will accept you as a new patient and what types of options they offer for patients who are self-pay. Some doctors offer discounts or will set up payment plans for their patients who do not have insurance, but you will need to ask so you aren't surprised when you get to your appointment.  2) Contact Your Local Health Department Not all health departments have doctors that can see patients for sick visits, but many do, so it is worth a call to see if yours does. If you don't know where your local health department is, you can check in your phone book. The CDC also has a tool to help you locate your state's health department, and many state websites also have listings of all of their local health departments.  3) Find a [facility] If your illness  is not likely to be very severe or complicated, you may want to try a walk in clinic. These are popping up all over the country in pharmacies, drugstores, and shopping centers. They're usually staffed by nurse practitioners or physician assistants that have been trained to treat common illnesses and complaints. They're usually fairly quick and inexpensive. However, if you have serious medical issues or chronic medical problems, these are probably not your best option.  No Primary Care Doctor: - Call Health Connect at  [phone] - they can  help you locate a primary care doctor that  accepts your insurance, provides certain services, etc. - Physician Referral Service- [phone]  Chronic Pain Problems: Organization         Address  Phone   Notes  [facility]  [phone] Patients need to be referred by their primary care doctor.   Medication Assistance: Organization         Address  Phone   Notes  [facility] [address] [place], [place] [address] [phone] --Must be a resident of [facility] -- Must have NO insurance coverage whatsoever (no Medicaid/ Medicare, etc.) -- The pt. MUST have a primary care doctor that directs their care regularly and follows them in the community   [facility]  [phone]   [employer]  [phone]    Agencies that provide inexpensive medical care: Organization         Address  Phone   Notes  [place]  [phone]   [facility]    [phone]   [facility] [place], [place] [address] [phone]   [place] [address], [place] [phone]   [facility]    [phone]   [facility]    [phone]   [place] [address], [place] Phone:  [phone], Fax:  [phone] Hours of Operation:  9 am - 6 pm, M-F.  Also accepts Medicaid/Medicare and self-pay.  [facility] for [place], [address], [place] Phone: [phone], Fax: [phone]. Hours of Operation:  8:30 am - 5:30 pm, M-F.  Also accepts Medicaid and self-pay.  [facility] [address] [place] Phone: [phone]   [place], [place], [place] [phone], Ext. [phone] Mondays & Thursdays: 7-9 AM.  First 15 patients are seen on a first come, first serve basis.    [place] Providers:  Organization         Address  Phone   Notes  [facility] [address], [place] [phone] Also accepts self-pay patients.  [facility] [address], [place]  [phone]   [place], [address], [place] [phone]   [facility] [address] [place] [phone]   Lucianne Lei [address], [place]   [phone] Only accepts [place] patients after they have their name applied to their card.   Self-Pay (no insurance) in [facility]:  Organization         Address  Phone   Notes  Sickle Cell Patients, [facility] [place], [place] [phone]  [facility] [place] [phone]   [facility] [place]  [place], [address], [place] [phone]   [facility]/Dr. Osei-Bonsu  [address], [place] or [address], [place] [phone] Phone number for both [place] and [place] locations is the same.  Urgent Medical and [facility] [address], [place] [phone]   [facility] [address] [place] or [address] [phone]   [facility] [address] [place] [phone], phone; [phone], fax Sees patients 1st and 3rd Saturday of every month.  Must not qualify for public or private insurance (i.e. Medicaid, Medicare, [place] Health Choice, Veterans' Benefits)  Household income should be no more than 200% of the poverty level The clinic cannot treat you if you are pregnant or think you are pregnant  Sexually transmitted diseases are not treated at the clinic.

## 2015-06-15 NOTE — ED Notes (Signed)
Declined W/C at D/C and was escorted to lobby by RN. 

## 2015-08-02 ENCOUNTER — Ambulatory Visit (INDEPENDENT_AMBULATORY_CARE_PROVIDER_SITE_OTHER): Payer: Medicaid Other | Admitting: Cardiovascular Disease

## 2015-08-02 ENCOUNTER — Encounter: Payer: Self-pay | Admitting: Cardiovascular Disease

## 2015-08-02 VITALS — BP 122/78 | HR 70 | Ht 66.0 in | Wt 251.5 lb

## 2015-08-02 DIAGNOSIS — R072 Precordial pain: Secondary | ICD-10-CM | POA: Diagnosis not present

## 2015-08-02 DIAGNOSIS — Z13220 Encounter for screening for lipoid disorders: Secondary | ICD-10-CM | POA: Diagnosis not present

## 2015-08-02 DIAGNOSIS — E038 Other specified hypothyroidism: Secondary | ICD-10-CM

## 2015-08-02 DIAGNOSIS — Z79899 Other long term (current) drug therapy: Secondary | ICD-10-CM | POA: Diagnosis not present

## 2015-08-02 NOTE — Patient Instructions (Signed)
Medication Instructions: Your physician recommends that you continue on your current medications as directed. Please refer to the Current Medication list given to you today.  Labwork: Your physician recommends that you return for lab work at your earliest [employer].  Testing/Procedures: 1. Exercise tolerance test - Your physician has requested that you have an exercise tolerance test. For further information please visit [email or web address]. Please also follow instruction sheet, as given.  Follow-up: Dr Sallyanne Kuster recommends that you schedule a follow-up appointment after the stress test.  If you need a refill on your cardiac medications before your next appointment, please call your pharmacy.

## 2015-08-02 NOTE — Progress Notes (Signed)
Patient ID: Holly Mueller, female   DOB: Feb 12, 1966, 50 y.o.   MRN: [id number]    Cardiology Consultation Note    Date:  08/03/2015   ID:  HOA BRIGGS, DOB 01/24/1966, MRN [id number]  Referring PCP:  Junie Panning, DO  Cardiologist:   Sanda Klein, MD   Chief Complaint  Patient presents with  . New Patient (Initial Visit)    no chest pain, occassional shortness of breath, has edema in left hand, no pain or cramping in legs, occassional lightheadedness or dizziness, has fatigue frequently    History of Present Illness:  Holly Mueller is a 50 y.o. female with morbid obesity referred in consultation for evaluation of chest pain.  Holly Mueller has noticed for the last couple of months that she develops precordial discomfort when she is under emotional stress. This appears to be almost always related to conflicts with her teenager younger son. She describes the discomfort as a "pulled muscle" or "pinched nerve" feeling in the left anterior chest and retrosternal area. The symptoms generally last for 1 or 2 minutes, if she is able to calm down and relax. They occur always at rest, standing up at the sink or sitting in a chair at the kitchen table. The do not occur during physical activity. 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 She has occasional shortness of breath which is not associated with the chest discomfort but seems to be related to bronchial asthma. She has a lot of discomfort in her left thumb area and is taking steroids for tendinitis.  She denies any other cardiovascular symptoms including syncope, orthopnea, PND, leg edema, palpitations, focal neurological deficits, claudication.  She does not have hypertension, diabetes, family history of coronary or other vascular disease and she has never smoked. She is not sure what her cholesterol is. Labs performed several years ago showed a  mildly elevated LDL at 121. She has treated hypothyroidism, but last documented TSH is from 2013.    Past Medical History  Diagnosis Date  . Asthma 2004  . GERD (gastroesophageal reflux disease) 2008  . Epilepsy ([place]) in 1972    No seizures since 1972. Previously treated with phenobarbital.   . Hypothyroidism 2008  . Seasonal allergies 2003  . Headache(784.0)     hx of migraines   . Arthritis     bursitis left hip flares-not an issue  . Edentulous     10-19-13 at present  . Shortness of breath   . Lower esophageal ring 08/18/2013    Past Surgical History  Procedure Laterality Date  . Novasure ablation    . Shoulder arthroscopy Right 2011  . Cesarean section      x2  . Wisdom tooth extraction    . Ventral hernia repair N/A 06/30/2012    Procedure: LAPAROSCOPIC VENTRAL HERNIA;  Surgeon: Shann Medal, MD;  Location: [facility];  Service: General;  Laterality: N/A;  With Mesh  . Umbilical hernia repair N/A 06/30/2012    Procedure: remove umbilicus;  Surgeon: Shann Medal, MD;  Location: [facility];  Service: General;  Laterality: N/A;  . Liver biopsy  06/30/2012    Procedure: LIVER BIOPSY;  Surgeon: Shann Medal, MD;  Location: [facility];  Service: General;;  . Dental surgery      multiple extractions 3'15  . Esophagogastroduodenoscopy N/A 10/24/2013    Procedure: ESOPHAGOGASTRODUODENOSCOPY (EGD);  Surgeon: Gatha Mayer, MD;  Location: [facility];  Service: Endoscopy;  Laterality: N/A;  . Balloon dilation N/A 10/24/2013    Procedure: BALLOON DILATION;  Surgeon: Gatha Mayer, MD;  Location: [facility];  Service: Endoscopy;  Laterality: N/A;    Outpatient Prescriptions Prior to Visit  Medication Sig Dispense Refill  . albuterol (PROVENTIL HFA;VENTOLIN HFA) 108 (90 BASE) MCG/ACT inhaler Inhale 2 puffs into the lungs every 6 (six) hours as needed for wheezing or shortness of breath. 1 Inhaler 2  . albuterol (PROVENTIL) (2.5 MG/3ML) 0.083% nebulizer solution Take 3 mLs (2.5 mg total)  by nebulization every 6 (six) hours as needed for wheezing or shortness of breath. 75 mL 2  . budesonide-formoterol (SYMBICORT) 160-4.5 MCG/ACT inhaler Inhale 2 puffs into the lungs 2 (two) times daily. 1 Inhaler 2  . fluticasone (FLONASE) 50 MCG/ACT nasal spray Place 2 sprays into both nostrils daily.    Marland Kitchen levothyroxine (SYNTHROID, LEVOTHROID) 300 MCG tablet TAKE 1 TABLET BY MOUTH ONCE DAILY 90 tablet 11  . montelukast (SINGULAIR) 10 MG tablet Take 10 mg by mouth daily.    . naproxen (NAPROSYN) 500 MG tablet Take 1 tablet (500 mg total) by mouth 2 (two) times daily. 30 tablet 0  . omeprazole (PRILOSEC) 40 MG capsule TAKE 1 CAPSULE BY MOUTH DAILY. *TAKE 30 MINUTES BEFORE BREAKFAST* 90 capsule 1  . predniSONE (DELTASONE) 20 MG tablet Take 2 tablets (40 mg total) by mouth daily. 6 tablet 0  . PROAIR HFA 108 (90 BASE) MCG/ACT inhaler INHALE 2 PUFFS INTO THE LUNGS EVERY 6 HOURS AS NEEDED FOR SHORTNESS OF BREATH 8.5 g 5  . benzonatate (TESSALON) 100 MG capsule Take 1-2 capsules (100-200 mg total) by mouth 2 (two) times daily as needed for cough. (Patient not taking: Reported on 08/02/2015) 30 capsule 1   No facility-administered medications prior to visit.     Allergies:   Aspirin   Social History   Social History  . Marital Status: Single    Spouse Name: N/A  . Number of Children: 2  . Years of Education: N/A   Occupational History  .  [employer]   Social History Main Topics  . Smoking status: Never Smoker   . Smokeless tobacco: Never Used  . Alcohol Use: 0.0 oz/week  . Drug Use: No  . Sexual Activity: No   Other Topics Concern  . None   Social History Narrative   Works in Estate manager/land agent at [employer].    Lives with 12 and 14 (boys).    Also living with close friends of the family (mother and two kids, adults).            Family History:  The patient's family history includes Diabetes in her mother and sister; Hypertension in her mother and sister; Lung cancer in her  father; Stroke in her father.   ROS:   Please see the history of present illness.    ROS All other systems reviewed and are negative.   PHYSICAL EXAM:   VS:  BP 122/78 mmHg  Pulse 70  Ht _0  (1.676 m)  Wt 114.08 kg (251 lb 8 oz)  BMI 40.61 kg/m2   GEN: Morbidly obese, well developed, in no acute distress.  HEENT: normal Neck: no JVD, carotid bruits, or masses Cardiac: RRR; no murmurs, rubs, or gallops,no edema  Respiratory:  clear to auscultation bilaterally, normal work of breathing GI: soft, nontender, nondistended, + BS MS: no deformity or atrophy Skin: warm and  dry, no rash Neuro:  Alert and Oriented x 3, Strength and sensation are intact Psych: euthymic mood, full affect  Wt Readings from Last 3 Encounters:  08/02/15 114.08 kg (251 lb 8 oz)  06/15/15 111.131 kg (245 lb)  04/26/15 112.311 kg (247 lb 9.6 oz)      Studies/Labs Reviewed:   EKG:  EKG is ordered today.  The ekg ordered today demonstrates Sinus rhythm, normal tracing  Recent Labs: No results found for requested labs within last 365 days.   Lipid Panel    Component Value Date/Time   CHOL 199 04/27/2007 1719   TRIG 143 04/27/2007 1719   HDL 32* 04/27/2007 1719   CHOLHDL 6.2 Ratio 04/27/2007 1719   VLDL 29 04/27/2007 1719   LDLCALC 138* 04/27/2007 1719   LDLDIRECT 121* 08/26/2011 0957     ASSESSMENT:    1. Precordial chest pain   2. Screening for lipid disorders   3. Morbid obesity due to excess calories ([place])   4. Other specified hypothyroidism   5. Medication management      PLAN:  In order of problems listed above:  1. Mrs. Baka's chest discomfort occurs with emotional distress, but not with physical activity. Other than morbid obesity and mildly elevated LDL cholesterol she does not have other coronary risk factors. Plan a treadmill stress test and only would recommend additional cardiac workup if this is abnormal. 2. Obtain a more updated lipid profile 3. Weight loss is strongly  recommended, but at this point there does not appear to be evidence of HTN, diabetes mellitus or sleep apnea or heart failure. 4. Recheck TSH    Medication Adjustments/Labs and Tests Ordered: Current medicines are reviewed at length with the patient today.  Concerns regarding medicines are outlined above.  Medication changes, Labs and Tests ordered today are listed in the Patient Instructions below. Patient Instructions  Medication Instructions: Your physician recommends that you continue on your current medications as directed. Please refer to the Current Medication list given to you today.  Labwork: Your physician recommends that you return for lab work at your earliest [facility].  Testing/Procedures: 1. Exercise tolerance test - Your physician has requested that you have an exercise tolerance test. For further information please visit [email or web address]. Please also follow instruction sheet, as given.  Follow-up: Dr Sallyanne Kuster recommends that you schedule a follow-up appointment after the stress test.  If you need a refill on your cardiac medications before your next appointment, please call your pharmacy.      Mikael Spray, MD  08/03/2015 11:45 AM    [address], [place], [place]  [address] Phone: [phone]; Fax: [phone]

## 2015-08-03 DIAGNOSIS — R072 Precordial pain: Secondary | ICD-10-CM | POA: Insufficient documentation

## 2015-08-09 ENCOUNTER — Encounter: Payer: Self-pay | Admitting: Cardiovascular Disease

## 2015-08-16 ENCOUNTER — Inpatient Hospital Stay (HOSPITAL_COMMUNITY): Admission: RE | Admit: 2015-08-16 | Payer: Medicaid Other | Source: Ambulatory Visit

## 2015-08-23 ENCOUNTER — Ambulatory Visit (INDEPENDENT_AMBULATORY_CARE_PROVIDER_SITE_OTHER): Payer: Medicaid Other

## 2015-08-23 DIAGNOSIS — R072 Precordial pain: Secondary | ICD-10-CM

## 2015-08-23 LAB — EXERCISE TOLERANCE TEST
CHL CUP MPHR: 170 {beats}/min
CHL CUP STRESS STAGE 1 SPEED: 0 mph
CHL CUP STRESS STAGE 2 GRADE: 0 %
CHL CUP STRESS STAGE 2 HR: 82 {beats}/min
CHL CUP STRESS STAGE 3 HR: 82 {beats}/min
CHL CUP STRESS STAGE 4 HR: 127 {beats}/min
CHL CUP STRESS STAGE 5 HR: 108 {beats}/min
CHL CUP STRESS STAGE 6 GRADE: 0 %
CHL CUP STRESS STAGE 6 HR: 66 {beats}/min
CSEPED: 1 min
CSEPEDS: 45 s
CSEPEW: 4.2 METS
CSEPPHR: 127 {beats}/min
CSEPPMHR: 74 %
Percent HR: 75 %
RPE: 18
Rest HR: 66 {beats}/min
Stage 1 DBP: 80 mmHg
Stage 1 Grade: 0 %
Stage 1 HR: 67 {beats}/min
Stage 1 SBP: 139 mmHg
Stage 2 Speed: 1 mph
Stage 3 Grade: 0 %
Stage 3 Speed: 1 mph
Stage 4 Grade: 10 %
Stage 4 Speed: 1.7 mph
Stage 5 DBP: 87 mmHg
Stage 5 Grade: 0 %
Stage 5 SBP: 183 mmHg
Stage 5 Speed: 0 mph
Stage 6 DBP: 79 mmHg
Stage 6 SBP: 118 mmHg
Stage 6 Speed: 0 mph

## 2015-09-11 ENCOUNTER — Encounter: Payer: Self-pay | Admitting: *Deleted

## 2015-09-11 ENCOUNTER — Ambulatory Visit: Payer: Medicaid Other | Admitting: Cardiovascular Disease

## 2015-10-09 ENCOUNTER — Ambulatory Visit
Admission: RE | Admit: 2015-10-09 | Discharge: 2015-10-09 | Disposition: A | Discharge status: Home / Self Care | Payer: Medicaid Other | Source: Ambulatory Visit | Attending: Family Medicine | Admitting: Family Medicine

## 2015-10-09 ENCOUNTER — Encounter: Payer: Self-pay | Admitting: Family Medicine

## 2015-10-09 ENCOUNTER — Ambulatory Visit (INDEPENDENT_AMBULATORY_CARE_PROVIDER_SITE_OTHER): Payer: Medicaid Other | Admitting: Family Medicine

## 2015-10-09 VITALS — BP 117/84 | HR 62 | Ht 66.0 in | Wt 250.0 lb

## 2015-10-09 DIAGNOSIS — M79645 Pain in left finger(s): Secondary | ICD-10-CM | POA: Diagnosis not present

## 2015-10-09 DIAGNOSIS — G8929 Other chronic pain: Secondary | ICD-10-CM | POA: Diagnosis not present

## 2015-10-09 DIAGNOSIS — J45901 Unspecified asthma with (acute) exacerbation: Secondary | ICD-10-CM | POA: Diagnosis not present

## 2015-10-09 MED ORDER — MELOXICAM 15 MG PO TABS: 15.0000 mg | ORAL_TABLET | Freq: Every day | ORAL | Status: DC

## 2015-10-09 MED FILL — MELOXICAM 15 MG TABLET: 15 | 30 days supply | Qty: 30 | Fill #0

## 2015-10-09 NOTE — Progress Notes (Signed)
  ODALY Mueller - 50 y.o. female MRN [id number]  Date of birth: [birth year] Holly Mueller is a 50 y.o. female who presents today for L hand pain.  L hand pain, initial visit, 10/09/15 - patient presents today with left hand pain. This is localized to the dorsal/radial aspect of the thumb. This is been ongoing now for several months to years. Denies a specific injury. She is right hand dominant has never injured her right or left hand. This is no been evaluated before and she denies any treatment to date. She has not taken any anti-inflammatories for this. No swelling in the area but pain worse at the end of the day especially with prolonged movements or fine motor movements.  PMHx - Updated and reviewed.  Contributory factors include: Hypothyroidism and osteoarthritis PSHx - Updated and reviewed.  Contributory factors include:  Right shoulder arthroscopy FHx - Updated and reviewed.  Contributory factors include:  Hypertension maternal Social Hx - Updated and reviewed. Contributory factors include: Nonsmoker  Medications - reviewed   ROS Per HPI.  12 point negative other than per HPI.   Exam:  Filed Vitals:   10/09/15 1518  BP: 117/84  Pulse: 62   Gen: NAD, AAO 3 Cardio- RRR Pulm - Normal respiratory effort/rate Skin: No rashes or erythema Extremities: No edema  Vascular: pulses +2 bilateral upper and lower extremity Psych: Normal affect  Msk: L hand - Slight z deformity of CMC joint.  TTP in this region.  Full ROM and neurovascular intact.  No scaphoid/snuff box TTP.  Negative Finkl[place]in   Imaging:  3 view thumb

## 2015-10-09 NOTE — Assessment & Plan Note (Signed)
Most likely CMC osteoarthritis. -3 view of the thumb -Mobic once daily. -Follow-up in 1 week discussion options. These would include but are not limited to topical medications including Voltaren gel, hand therapy, thumb splinting,  injection, surgical fusion.

## 2015-10-16 ENCOUNTER — Ambulatory Visit: Payer: Medicaid Other | Admitting: Family Medicine

## 2015-10-30 ENCOUNTER — Ambulatory Visit: Payer: Medicaid Other | Admitting: Family Medicine

## 2015-11-04 ENCOUNTER — Telehealth: Payer: Self-pay | Admitting: Cardiovascular Disease

## 2015-11-04 DIAGNOSIS — R9439 Abnormal result of other cardiovascular function study: Secondary | ICD-10-CM

## 2015-11-04 NOTE — Telephone Encounter (Signed)
Please call and schedule pt for lexiscan myoview and then follow up with dr croitoru after testing

## 2015-11-04 NOTE — Telephone Encounter (Signed)
New message   Pt is calling to speak to rn about scheduling a procedure

## 2015-11-06 ENCOUNTER — Telehealth: Payer: Self-pay | Admitting: Cardiovascular Disease

## 2015-11-06 NOTE — Telephone Encounter (Signed)
Called and left a voicemail for the patient to call me back to schedule her 2 day stress test.

## 2015-11-08 NOTE — Telephone Encounter (Signed)
Follow up ° ° °Pt is returning call for rn  °

## 2015-11-08 NOTE — Telephone Encounter (Signed)
This is a scheduling concern. Routed to [employer].

## 2015-11-16 ENCOUNTER — Emergency Department (HOSPITAL_COMMUNITY)
Admission: EM | Admit: 2015-11-16 | Discharge: 2015-11-16 | Disposition: A | Discharge status: Home / Self Care | Payer: Medicaid Other | Attending: Emergency Medicine | Admitting: Emergency Medicine

## 2015-11-16 ENCOUNTER — Encounter (HOSPITAL_COMMUNITY): Payer: Self-pay

## 2015-11-16 ENCOUNTER — Emergency Department (HOSPITAL_COMMUNITY): Payer: Medicaid Other

## 2015-11-16 DIAGNOSIS — R05 Cough: Secondary | ICD-10-CM | POA: Diagnosis present

## 2015-11-16 DIAGNOSIS — E039 Hypothyroidism, unspecified: Secondary | ICD-10-CM | POA: Diagnosis not present

## 2015-11-16 DIAGNOSIS — R059 Cough, unspecified: Secondary | ICD-10-CM

## 2015-11-16 DIAGNOSIS — Z79899 Other long term (current) drug therapy: Secondary | ICD-10-CM | POA: Insufficient documentation

## 2015-11-16 DIAGNOSIS — J45901 Unspecified asthma with (acute) exacerbation: Secondary | ICD-10-CM

## 2015-11-16 DIAGNOSIS — J069 Acute upper respiratory infection, unspecified: Secondary | ICD-10-CM

## 2015-11-16 MED ORDER — PREDNISONE 20 MG PO TABS
60.0000 mg | ORAL_TABLET | Freq: Once | ORAL | Status: AC
  Administered 2015-11-16: 60 mg via ORAL
  Filled 2015-11-16: qty 3

## 2015-11-16 MED ORDER — ALBUTEROL SULFATE (2.5 MG/3ML) 0.083% IN NEBU
INHALATION_SOLUTION | RESPIRATORY_TRACT | Status: AC
  Filled 2015-11-16: qty 6

## 2015-11-16 MED ORDER — ALBUTEROL SULFATE (2.5 MG/3ML) 0.083% IN NEBU
5.0000 mg | INHALATION_SOLUTION | Freq: Once | RESPIRATORY_TRACT | Status: AC
  Administered 2015-11-16: 5 mg via RESPIRATORY_TRACT

## 2015-11-16 MED ORDER — PREDNISONE 20 MG PO TABS: ORAL_TABLET | ORAL | Status: DC

## 2015-11-16 NOTE — ED Notes (Signed)
Pt. Is in Fast Track with her son

## 2015-11-16 NOTE — ED Provider Notes (Signed)
CSN: [id number]     Arrival date & time 11/16/15  1140 History  By signing my name below, I, Dyke Brackett, attest that this documentation has been prepared under the direction and in the presence of non-physician practitioner, Zacarias Pontes, PA-C Electronically Signed: Dyke Brackett, Scribe. 11/16/2015. 3:00 PM.   Chief Complaint  Patient presents with  . Cough  . asthma    Patient is a 50 y.o. female presenting with cough. The history is provided by the patient and medical records. No language interpreter was used.  Cough Cough characteristics:  Productive Sputum characteristics:  Yellow and clear Severity:  Moderate Onset quality:  Gradual Duration:  1 week Timing:  Intermittent Progression:  Unchanged Chronicity:  Recurrent Smoker: no   Context: exposure to allergens   Context: not sick contacts   Relieved by:  Home nebulizer (nebulizer here) Worsened by:  Nothing tried Ineffective treatments:  Beta-agonist inhaler and steroid inhaler Associated symptoms: rhinorrhea (clear) and shortness of breath   Associated symptoms: no chest pain, no chills, no fever, no myalgias, no sore throat and no wheezing   Risk factors: no recent travel   HPI Comments:  Holly Mueller is a 50 y.o. femalewith PMHx of asthma, seasonal allergies, GERD, epilepsy, hypothyroidism, chronic SOB/precordial CP, and headaches, who presents to the Emergency Department complaining of intermittent, gradually worsening cough with clear and sometimes yellowish green sputum production onset one week ago. Per pt, her cough has worsened in the last 3-4 days. Pt has used home inhaler treatments (albuterol and Symbicort) with no relief but states that the nebulizer given in triage (65m albuterol) helped her symptoms. She notes associated chest congestion, clear rhinorrhea, SOB, and some chest tightness (which has since resolved with neb tx here). States this feels like her prior asthma issues. She denies fevers,  chills, wheezing, sore throat, hemoptysis, CP, LE swelling, recent travel/surgery/immobilization, estrogen use, personal or family hx of DVT/PE, abd pain, N/V/D/C, hematuria, dysuria, myalgias, arthralgias, numbness, tingling, weakness, or rashes. No known sick contact. States her allergies have been acting up lately.   Of note, [facility] review [facility] months [facility] precordial chest pain, is waiting on scheduling a stress test in several days for evaluation of this issue. She currently denies CP at this time.   Past Medical History  Diagnosis Date  . Asthma 2004  . GERD (gastroesophageal reflux disease) 2008  . [place]    No seizures since 1972. Previously treated with phenobarbital.   . Hypothyroidism 2008  . Seasonal allergies 2003  . Headache(784.0)     hx of migraines   . Arthritis     bursitis left hip flares-not an issue  . Edentulous     10-19-13 at present  . Shortness of breath   . Lower esophageal ring 08/18/2013   Past Surgical History  Procedure Laterality Date  . Novasure ablation    . Shoulder arthroscopy Right 2011  . Cesarean section      x2  . Wisdom tooth extraction    . Ventral hernia repair N/A 06/30/2012    Procedure: LAPAROSCOPIC VENTRAL HERNIA;  Surgeon: DShann Medal MD;  Location: [facility];  Service: General;  Laterality: N/A;  With Mesh  . Umbilical hernia repair N/A 06/30/2012    Procedure: remove umbilicus;  Surgeon: DShann Medal MD;  Location: [facility];  Service: General;  Laterality: N/A;  . Liver biopsy  06/30/2012    Procedure:  LIVER BIOPSY;  Surgeon: Shann Medal, MD;  Location: [facility];  Service: General;;  . Dental surgery      multiple extractions 3'15  . Esophagogastroduodenoscopy N/A 10/24/2013    Procedure: ESOPHAGOGASTRODUODENOSCOPY (EGD);  Surgeon: Gatha Mayer, MD;  Location: [facility];  Service: Endoscopy;  Laterality: N/A;  . Balloon dilation N/A 10/24/2013    Procedure:  BALLOON DILATION;  Surgeon: Gatha Mayer, MD;  [facility];  Service: Endoscopy;  Laterality: N/A;   Family History  Problem Relation Age of Onset  . Hypertension Mother   . Diabetes Mother   . Lung cancer Father   . Stroke Father   . Diabetes Sister   . Hypertension Sister    Social History  Substance Use Topics  . Smoking status: Never Smoker   . Smokeless tobacco: Never Used  . Alcohol Use: 0.0 oz/week   OB History    No data available     Review of Systems  Constitutional: Negative for fever and chills.  HENT: Positive for rhinorrhea (clear). Negative for sore throat.   Respiratory: Positive for cough, chest tightness and shortness of breath. Negative for wheezing.   Cardiovascular: Negative for chest pain and leg swelling.  Gastrointestinal: Negative for nausea, vomiting, abdominal pain, diarrhea and constipation.  Genitourinary: Negative for dysuria and hematuria.  Musculoskeletal: Negative for myalgias and arthralgias.  Skin: Negative for color change.  Allergic/Immunologic: Positive for environmental allergies. Negative for immunocompromised state.  Neurological: Negative for weakness and numbness.  Psychiatric/Behavioral: Negative for confusion.  10 Systems reviewed and are negative for acute change except as noted in the HPI.     Allergies  Aspirin  Home Medications   Prior to Admission medications   Medication Sig Start Date End Date Taking? Authorizing Provider  albuterol (PROVENTIL) (2.5 MG/3ML) 0.083% nebulizer solution Take 3 mLs (2.5 mg total) by nebulization every 6 (six) hours as needed for wheezing or shortness of breath. 06/11/14   Mariel Aloe, MD  budesonide-formoterol [facility]) 160-4.5 MCG/ACT inhaler Inhale 2 puffs into the lungs 2 (two) times daily. 04/05/15   Gustavus Bryant, MD  fluticasone (FLONASE) 50 MCG/ACT nasal spray Place 2 sprays into both nostrils daily.    Historical Provider, MD  levothyroxine (SYNTHROID, LEVOTHROID)  300 MCG tablet TAKE 1 TABLET BY MOUTH ONCE DAILY 06/11/14   Advance N Rumley, DO  meloxicam (MOBIC) 15 MG tablet Take 1 tablet (15 mg total) by mouth daily. 10/09/15   Bryan R Hess, DO  montelukast (SINGULAIR) 10 MG tablet Take 10 mg by mouth daily.    Historical Provider, MD  naproxen (NAPROSYN) 500 MG tablet Take 1 tablet (500 mg total) by mouth 2 (two) times daily. 06/15/15   Olivia Canter Sam, PA-C  omeprazole (PRILOSEC) 40 MG capsule TAKE 1 CAPSULE BY MOUTH DAILY. *TAKE 30 MINUTES BEFORE BREAKFAST* 04/26/15   Gatha Mayer, MD  predniSONE (DELTASONE) 20 MG tablet Take 2 tablets (40 mg total) by mouth daily. 06/15/15   Olivia Canter Sam, PA-C  PROAIR HFA 108 (90 BASE) MCG/ACT inhaler INHALE 2 PUFFS INTO THE LUNGS EVERY 6 HOURS AS NEEDED FOR SHORTNESS OF BREATH 04/26/15   Norborne N Rumley, DO   BP 123/81 mmHg  Pulse 83  Temp(Src) 98.4 F (36.9 C) (Oral)  Resp 20  SpO2 96% Physical Exam  Constitutional: She is oriented to person, place, and time. Vital signs are normal. She appears well-developed and well-nourished.  Non-toxic appearance. No distress.  Afebrile, nontoxic, NAD  HENT:  Head: Normocephalic and atraumatic.  Mouth/Throat: Oropharynx is clear and moist and mucous membranes are normal.  Eyes: Conjunctivae and EOM are normal. Right eye exhibits no discharge. Left eye exhibits no discharge.  Neck: Normal range of motion. Neck supple.  Cardiovascular: Normal rate, regular rhythm, normal heart sounds and intact distal pulses.  Exam reveals no gallop and no friction rub.   No murmur heard. RRR, nl s1/s2, no m/r/g, distal pulses intact, no pedal edema  Pulmonary/Chest: Effort normal and breath sounds normal. No respiratory distress. She has no decreased breath sounds. She has no wheezes. She has no rhonchi. She has no rales.  CTAB in all lung fields, no w/r/r, no hypoxia or increased WOB, speaking in full sentences, SpO2 96% on RA, intermittent dry cough during exam.   Abdominal: Soft. Normal  appearance and bowel sounds are normal. She exhibits no distension. There is no tenderness. There is no rigidity, no rebound, no guarding, no CVA tenderness, no tenderness at McBurney's point and negative Murphy's sign.  Musculoskeletal: Normal range of motion.  Neurological: She is alert and oriented to person, place, and time. She has normal strength. No sensory deficit.  Skin: Skin is warm, dry and intact. No rash noted.  Psychiatric: She has a normal mood and affect.  Nursing note and vitals reviewed.   ED Course  Procedures  DIAGNOSTIC STUDIES:  Oxygen Saturation is 96% on RA, adequate by my interpretation.    COORDINATION OF CARE:  2:57 PM Will order prednisone and albuterol.Discussed treatment plan with pt at bedside and pt agreed to plan.  Labs Review Labs Reviewed - No data to display  Imaging Review Dg Chest 2 View  11/16/2015  CLINICAL DATA:  Left-sided chest pain. Shortness of breath and cough for 1 week EXAM: CHEST  2 VIEW COMPARISON:  April 05, 2015 FINDINGS: There is minimal scarring in the left base. Lungs elsewhere clear. Heart size and pulmonary vascularity are normal. No adenopathy. No pneumothorax. No bone lesions. IMPRESSION: Minimal scarring left base. No edema or consolidation. Stable cardiac silhouette. Electronically Signed   By: Lowella Grip III M.D.   On: 11/16/2015 12:36   I have personally reviewed and evaluated these images and lab results as part of my medical decision-making.   EKG Interpretation None      MDM   Final diagnoses:  Cough  URI (upper respiratory infection)  Asthma exacerbation    50 y.o. female here with cough/chest congestion x1wk, some mild SOB, no wheezing or CP. No pedal edema, no hemoptysis, no tachycardia or hypoxia, doubt PE. Given neb tx here and feels improved. EKG without acute ischemic findings. CXR obtained prior to exam was neg for acute findings. Lungs clear on exam. States this feels like her asthma, has  environmental allergies which could be aggravating her asthma. Discussed symptomatic control, will start on prednisone for short course, discussed using home inhalers as directed, and f/up with PCP in 1wk. Mucinex and antihistamine use discussed. Doubt need for further w/up at this time, doubt need for more neb tx now. I explained the diagnosis and have given explicit precautions to return to the ER including for any other new or worsening symptoms. The patient understands and accepts the medical plan as it's been dictated and I have answered their questions. Discharge instructions concerning home care and prescriptions have been given. The patient is STABLE and is discharged to home in good condition.   I personally performed the services described in this documentation, which was scribed  in my presence. The recorded information has been reviewed and is accurate.  BP 123/81 mmHg  Pulse 83  Temp(Src) 98.4 F (36.9 C) (Oral)  Resp 20  SpO2 96%  Meds ordered this encounter  Medications  . albuterol (PROVENTIL) (2.5 MG/3ML) 0.083% nebulizer solution 5 mg    Sig:   . albuterol (PROVENTIL) (2.5 MG/3ML) 0.083% nebulizer solution    Sig:     Blue-Matthews, Jamie: cabinet override  . predniSONE (DELTASONE) tablet 60 mg    Sig:   . predniSONE (DELTASONE) 20 MG tablet    Sig: 3 tabs po daily x 3 days starting on 11/17/15    Dispense:  9 tablet    Refill:  0    Order Specific Question:  Supervising Provider    Answer:  Noemi Chapel [[id number]]        Khamil Lamica Camprubi-Soms, PA-C 11/16/15 1511  Lajean Saver, MD 11/18/15 1216

## 2015-11-16 NOTE — Discharge Instructions (Signed)
Continue to stay well-hydrated. Gargle warm salt water and spit it out. Use chloraseptic spray as needed for sore throat. Continue to alternate between Tylenol and Ibuprofen for pain or fever. Use Mucinex for cough suppression/expectoration of mucus. Use netipot and flonase to help with nasal congestion. Use home inhaler as directed, as needed for cough/chest congestion/wheezing. Take prednisone as directed, starting tomorrow since you got today's dose in the ER. May consider over-the-counter Benadryl or other antihistamine to decrease secretions and for watery itchy eyes. Followup with your primary care doctor in 5-7 days for recheck of ongoing symptoms. Return to emergency department for emergent changing or worsening of symptoms.   Asthma, Acute Bronchospasm Acute bronchospasm caused by asthma is also referred to as an asthma attack. Bronchospasm means your air passages become narrowed. The narrowing is caused by inflammation and tightening of the muscles in the air tubes (bronchi) in your lungs. This can make it hard to breathe or cause you to wheeze and cough. CAUSES Possible triggers are:  Animal dander from the skin, hair, or feathers of animals.  Dust mites contained in house dust.  Cockroaches.  Pollen from trees or grass.  Mold.  Cigarette or tobacco smoke.  Air pollutants such as dust, household cleaners, hair sprays, aerosol sprays, paint fumes, strong chemicals, or strong odors.  Cold air or weather changes. Cold air may trigger inflammation. Winds increase molds and pollens in the air.  Strong emotions such as crying or laughing hard.  Stress.  Certain medicines such as aspirin or beta-blockers.  Sulfites in foods and drinks, such as dried fruits and wine.  Infections or inflammatory conditions, such as a flu, cold, or inflammation of the nasal membranes (rhinitis).  Gastroesophageal reflux disease (GERD). GERD is a condition where stomach acid backs up into your  esophagus.  Exercise or strenuous activity. SIGNS AND SYMPTOMS   Wheezing.  Excessive coughing, particularly at night.  Chest tightness.  Shortness of breath. DIAGNOSIS  Your health care provider will ask you about your medical history and perform a physical exam. A chest X-ray or blood testing may be performed to look for other causes of your symptoms or other conditions that may have triggered your asthma attack. TREATMENT  Treatment is aimed at reducing inflammation and opening up the airways in your lungs. Most asthma attacks are treated with inhaled medicines. These include quick relief or rescue medicines (such as bronchodilators) and controller medicines (such as inhaled corticosteroids). These medicines are sometimes given through an inhaler or a nebulizer. Systemic steroid medicine taken by mouth or given through an IV tube also can be used to reduce the inflammation when an attack is moderate or severe. Antibiotic medicines are only used if a bacterial infection is present.  HOME CARE INSTRUCTIONS   Rest.  Drink plenty of liquids. This helps the mucus to remain thin and be easily coughed up. Only use caffeine in moderation and do not use alcohol until you have recovered from your illness.  Do not smoke. Avoid being exposed to secondhand smoke.  You play a critical role in keeping yourself in good health. Avoid exposure to things that cause you to wheeze or to have breathing problems.  Keep your medicines up-to-date and available. Carefully follow your health care provider's treatment plan.  Take your medicine exactly as prescribed.  When pollen or pollution is bad, keep windows closed and use an air conditioner or go to places with air conditioning.  Asthma requires careful medical care. See your health care  provider for a follow-up as advised. If you are more than [redacted] weeks pregnant and you were prescribed any new medicines, let your obstetrician know about the visit and  how you are doing. Follow up with your health care provider as directed.  After you have recovered from your asthma attack, make an appointment with your outpatient doctor to talk about ways to reduce the likelihood of future attacks. If you do not have a doctor who manages your asthma, make an appointment with a primary care doctor to discuss your asthma. SEEK IMMEDIATE MEDICAL CARE IF:   You are getting worse.  You have trouble breathing. If severe, call your local emergency services (911 in the [place]  You develop chest pain or discomfort.  You are vomiting.  You are not able to keep fluids down.  You are coughing up yellow, green, brown, or bloody sputum.  You have a fever and your symptoms suddenly get worse.  You have trouble swallowing. MAKE SURE YOU:   Understand these instructions.  Will watch your condition.  Will get help right away if you are not doing well or get worse.   This information is not intended to replace advice given to you by your health care provider. Make sure you discuss any questions you have with your health care provider.   Document Released: 08/12/2006 Document Revised: 05/02/2013 Document Reviewed: 11/02/2012 Elsevier Interactive Patient Education 2016 [employer] Prevention While you may not be able to control the fact that you have asthma, you can take actions to prevent asthma attacks. The best way to prevent asthma attacks is to maintain good control of your asthma. You can achieve this by:  Taking your medicines as directed.  Avoiding things that can irritate your airways or make your asthma symptoms worse (asthma triggers).  Keeping track of how well your asthma is controlled and of any changes in your symptoms.  Responding quickly to worsening asthma symptoms (asthma attack).  Seeking emergency care when it is needed. WHAT ARE SOME WAYS TO PREVENT AN ASTHMA ATTACK? Have a Plan Work with your health care provider  to create a written plan for managing and treating your asthma attacks (asthma action plan). This plan includes:  A list of your asthma triggers and how you can avoid them.  Information on when medicines should be taken and when their dosages should be changed.  The use of a device that measures how well your lungs are working (peak flow meter). Monitor Your Asthma Use your peak flow meter and record your results in a journal every day. A drop in your peak flow numbers on one or more days may indicate the start of an asthma attack. This can happen even before you start to feel symptoms. You can prevent an asthma attack from getting worse by following the steps in your asthma action plan. Avoid Asthma Triggers Work with your asthma health care provider to find out what your asthma triggers are. This can be done by:  Allergy testing.  Keeping a journal that notes when asthma attacks occur and the factors that may have contributed to them.  Determining if there are other medical conditions that are making your asthma worse. Once you have determined your asthma triggers, take steps to avoid them. This may include avoiding excessive or prolonged exposure to:  Dust. Have someone dust and vacuum your home for you once or twice a week. Using a high-efficiency particulate arrestance (HEPA) vacuum is best.  Smoke.  This includes campfire smoke, forest fire smoke, and secondhand smoke from tobacco products.  Pet dander. Avoid contact with animals that you know you are allergic to.  Allergens from trees, grasses or pollens. Avoid spending a lot of time outdoors when pollen counts are high, and on very windy days.  Very cold, dry, or humid air.  Mold.  Foods that contain high amounts of sulfites.  Strong odors.  Outdoor air pollutants, such as Lexicographer.  Indoor air pollutants, such as aerosol sprays and fumes from household cleaners.  Household pests, including dust mites and  cockroaches, and pest droppings.  Certain medicines, including NSAIDs. Always talk to your health care provider before stopping or starting any new medicines. Medicines Take over-the-counter and prescription medicines only as told by your health care provider. Many asthma attacks can be prevented by carefully following your medicine schedule. Taking your medicines correctly is especially important when you cannot avoid certain asthma triggers. Act Quickly If an asthma attack does happen, acting quickly can decrease how severe it is and how long it lasts. Take these steps:   Pay attention to your symptoms. If you are coughing, wheezing, or having difficulty breathing, do not wait to see if your symptoms go away on their own. Follow your asthma action plan.  If you have followed your asthma action plan and your symptoms are not improving, call your health care provider or seek immediate medical care at the nearest hospital. It is important to note how often you need to use your fast-acting rescue inhaler. If you are using your rescue inhaler more often, it may mean that your asthma is not under control. Adjusting your asthma treatment plan may help you to prevent future asthma attacks and help you to gain better control of your condition. HOW CAN I PREVENT AN ASTHMA ATTACK WHEN I EXERCISE? Follow advice from your health care provider about whether you should use your fast-acting inhaler before exercising. Many people with asthma experience exercise-induced bronchoconstriction (EIB). This condition often worsens during vigorous exercise in cold, humid, or dry environments. Usually, people with EIB can stay very active by pre-treating with a fast-acting inhaler before exercising.   This information is not intended to replace advice given to you by your health care provider. Make sure you discuss any questions you have with your health care provider.   Document Released: 04/15/2009 Document Revised:  01/16/2015 Document Reviewed: 09/27/2014 Elsevier Interactive Patient Education 2016 Elsevier Inc[employer]  Cough, Adult A cough helps to clear your throat and lungs. A cough may last only 2-3 weeks (acute), or it may last longer than 8 weeks (chronic). Many different things can cause a cough. A cough may be a sign of an illness or another medical condition. HOME CARE  Pay attention to any changes in your cough.  Take medicines only as told by your doctor.  If you were prescribed an antibiotic medicine, take it as told by your doctor. Do not stop taking it even if you start to feel better.  Talk with your doctor before you try using a cough medicine.  Drink enough fluid to keep your pee (urine) clear or pale yellow.  If the air is dry, use a cold steam vaporizer or humidifier in your home.  Stay away from things that make you cough at work or at home.  If your cough is worse at night, try using extra pillows to raise your head up higher while you sleep.  Do not smoke, and try not  to be around smoke. If you need help quitting, ask your doctor.  Do not have caffeine.  Do not drink alcohol.  Rest as needed. GET HELP IF:  You have new problems (symptoms).  You cough up yellow fluid (pus).  Your cough does not get better after 2-3 weeks, or your cough gets worse.  Medicine does not help your cough and you are not sleeping well.  You have pain that gets worse or pain that is not helped with medicine.  You have a fever.  You are losing weight and you do not know why.  You have night sweats. GET HELP RIGHT AWAY IF:  You cough up blood.  You have trouble breathing.  Your heartbeat is very fast.   This information is not intended to replace advice given to you by your health care provider. Make sure you discuss any questions you have with your health care provider.   Document Released: 01/08/2011 Document Revised: 01/16/2015 Document Reviewed: 07/04/2014 Elsevier Interactive  Patient Education 2016 Elsevier Inc.  Viral Infections A viral infection can be caused by different types of viruses.Most viral infections are not serious and resolve on their own. However, some infections may cause severe symptoms and may lead to further complications. SYMPTOMS Viruses can frequently cause:  Minor sore throat.  Aches and pains.  Headaches.  Runny nose.  Different types of rashes.  Watery eyes.  Tiredness.  Cough.  Loss of appetite.  Gastrointestinal infections, resulting in nausea, vomiting, and diarrhea. These symptoms do not respond to antibiotics because the infection is not caused by bacteria. However, you might catch a bacterial infection following the viral infection. This is sometimes called a "superinfection." Symptoms of such a bacterial infection may include:  Worsening sore throat with pus and difficulty swallowing.  Swollen neck glands.  Chills and a high or persistent fever.  Severe headache.  Tenderness over the sinuses.  Persistent overall ill feeling (malaise), muscle aches, and tiredness (fatigue).  Persistent cough.  Yellow, green, or brown mucus production with coughing. HOME CARE INSTRUCTIONS   Only take over-the-counter or prescription medicines for pain, discomfort, diarrhea, or fever as directed by your caregiver.  Drink enough water and fluids to keep your urine clear or pale yellow. Sports drinks can provide valuable electrolytes, sugars, and hydration.  Get plenty of rest and maintain proper nutrition. Soups and broths with crackers or rice are fine. SEEK IMMEDIATE MEDICAL CARE IF:   You have severe headaches, shortness of breath, chest pain, neck pain, or an unusual rash.  You have uncontrolled vomiting, diarrhea, or you are unable to keep down fluids.  You or your child has an oral temperature above 102 F (38.9 C), not controlled by medicine.  Your baby is older than 3 months with a rectal temperature of 102  F (38.9 C) or higher.  Your baby is 79 months old or younger with a rectal temperature of 100.4 F (38 C) or higher. MAKE SURE YOU:   Understand these instructions.  Will watch your condition.  Will get help right away if you are not doing well or get worse.   This information is not intended to replace advice given to you by your health care provider. Make sure you discuss any questions you have with your health care provider.   Document Released: 02/04/2005 Document Revised: 07/20/2011 Document Reviewed: 10/03/2014 Elsevier Interactive Patient Education [employer].

## 2015-11-16 NOTE — ED Notes (Signed)
Patient here with cough, congestion and chest tightness x 4 days. Using inhaler and neb with some relief but feels worse today. Speaking complete sentences

## 2015-12-09 ENCOUNTER — Ambulatory Visit (INDEPENDENT_AMBULATORY_CARE_PROVIDER_SITE_OTHER): Payer: Medicaid Other | Admitting: Family Medicine

## 2015-12-09 ENCOUNTER — Encounter: Payer: Self-pay | Admitting: Family Medicine

## 2015-12-09 DIAGNOSIS — R05 Cough: Secondary | ICD-10-CM | POA: Diagnosis not present

## 2015-12-09 DIAGNOSIS — R053 Chronic cough: Secondary | ICD-10-CM | POA: Insufficient documentation

## 2015-12-09 DIAGNOSIS — J45901 Unspecified asthma with (acute) exacerbation: Secondary | ICD-10-CM | POA: Diagnosis not present

## 2015-12-09 DIAGNOSIS — R059 Cough, unspecified: Secondary | ICD-10-CM

## 2015-12-09 MED ORDER — ALBUTEROL SULFATE HFA 108 (90 BASE) MCG/ACT IN AERS: INHALATION_SPRAY | RESPIRATORY_TRACT | 5 refills | Status: DC

## 2015-12-09 MED ORDER — BUDESONIDE-FORMOTEROL FUMARATE 160-4.5 MCG/ACT IN AERO: 2.0000 | INHALATION_SPRAY | Freq: Two times a day (BID) | RESPIRATORY_TRACT | 2 refills | Status: DC

## 2015-12-09 MED ORDER — LEVOTHYROXINE SODIUM 300 MCG PO TABS: 300.0000 ug | ORAL_TABLET | Freq: Every day | ORAL | 3 refills | Status: DC

## 2015-12-09 MED ORDER — MONTELUKAST SODIUM 10 MG PO TABS: 10.0000 mg | ORAL_TABLET | Freq: Every day | ORAL | 3 refills | Status: DC

## 2015-12-09 MED ORDER — OMEPRAZOLE 40 MG PO CPDR: DELAYED_RELEASE_CAPSULE | ORAL | 1 refills | Status: DC

## 2015-12-09 MED FILL — OMEPRAZOLE DR 40 MG CAPSULE: 40 | 30 days supply | Qty: 30 | Fill #0

## 2015-12-09 MED FILL — PROAIR HFA 90 MCG INHALER: 108 (90 BAS | 25 days supply | Qty: 9 | Fill #0

## 2015-12-09 MED FILL — MONTELUKAST SOD 10 MG TAB: 10 | 30 days supply | Qty: 30 | Fill #0

## 2015-12-09 MED FILL — SYMBICORT 160-4.5 MCG INH: 160-4.5 | 30 days supply | Qty: 10 | Fill #0

## 2015-12-09 MED FILL — LEVOTHYROXINE 300 MCG TAB: 300 | 30 days supply | Qty: 30 | Fill #0

## 2015-12-09 NOTE — Patient Instructions (Signed)
It was a pleasure seeing you today in our clinic. Today we discussed your cough. Here is the treatment plan we have discussed and agreed upon together:   - I've refilled her medications at this time. - I think it is important for you to stay very compliant with her medication regimen at this time. Your Symbicort regimen is currently at the highest dosage available. - I think it would be beneficial for your health to consider seeking residents with a different roommate. The fact that your roommate currently smokes is likely having a negative affect on your asthma/COPD.

## 2015-12-09 NOTE — Progress Notes (Signed)
   HPI  CC: ED f/u, COUGH Started w/ a cough. Seen in ED on 7/8. Feeling better now. Still coughing but breathing better. Completed prednisone course this weekend. Yesterday wasn't breathing as well as she is today.  Has been coughing for ~1 month. Cough is: dry now but was originally productive w/ new sputum production Sputum production: not anymore Medications tried: prednisone, symbicort, albuterol, singulair Taking blood pressure medications: no  Symptoms Runny nose: mild Mucous in back of throat: no Throat burning or reflux: occasionally Wheezing or asthma: yes, prior to ED visit, not since Fever: no Chest Pain: no Shortness of breath: some Leg swelling: no Hemoptysis: no Weight loss: no  ROS see HPI Smoking Status noted  Review of Systems   See HPI for ROS. All other systems reviewed and are negative.  CC, SH/smoking status, and VS noted  Objective: BP 125/81 (BP Location: Right Arm, Patient Position: Sitting, Cuff Size: Normal)   Pulse 73   Temp 98.6 F (37 C) (Oral)   Ht _0  (1.676 m)   Wt 250 lb 9.6 oz (113.7 kg)   BMI 40.45 kg/m  Gen: NAD, alert, cooperative, and pleasant. HEENT: NCAT, EOMI, PERRL, OP clear, no LAD CV: RRR, no murmur Resp: Wheezing and crackles noted bilaterally (possibly 'velcro-like' lung sounds), non-labored Ext: No edema, warm Neuro: Alert and oriented, Speech clear, No gross deficits  Assessment and plan:  Cough Patient is here for persistent cough that was seen and treated in the ED to 3 weeks ago. Cough is improved but still present. Patient has a long history of asthma and is currently on a regimen that is fairly aggressive (appropriately so). Physical exam consistent with persistent asthmatic symptoms. However differential includes interstitial lung diseases like idiopathic pulmonary fibrosis secondary to the finding of somewhat "Velcro-like" lung sounds which would be consistent with interstitial lung disease. - Patient is a  nonsmoker but her roommate smokes both inside and outside the house. I informed her that he would be in her best interest to get a new roommate. - Continue current asthma medication regimen. - Consider more aggressive workup if patient's symptoms persist.   Meds ordered this encounter  Medications  . budesonide-formoterol (SYMBICORT) 160-4.5 MCG/ACT inhaler    Sig: Inhale 2 puffs into the lungs 2 (two) times daily.    Dispense:  1 Inhaler    Refill:  2  . levothyroxine (SYNTHROID, LEVOTHROID) 300 MCG tablet    Sig: Take 1 tablet (300 mcg total) by mouth daily.    Dispense:  90 tablet    Refill:  3  . montelukast (SINGULAIR) 10 MG tablet    Sig: Take 1 tablet (10 mg total) by mouth daily.    Dispense:  90 tablet    Refill:  3  . omeprazole (PRILOSEC) 40 MG capsule    Sig: TAKE 1 CAPSULE BY MOUTH DAILY. *TAKE 30 MINUTES BEFORE BREAKFAST*    Dispense:  90 capsule    Refill:  1  . albuterol (PROAIR HFA) 108 (90 Base) MCG/ACT inhaler    Sig: INHALE 2 PUFFS INTO THE LUNGS EVERY 6 HOURS AS NEEDED FOR SHORTNESS OF BREATH    Dispense:  8.5 g    Refill:  5     Elberta Leatherwood, MD,MS,  PGY3 12/09/2015 6:54 PM

## 2015-12-09 NOTE — Assessment & Plan Note (Signed)
Patient is here for persistent cough that was seen and treated in the ED to 3 weeks ago. Cough is improved but still present. Patient has a long history of asthma and is currently on a regimen that is fairly aggressive (appropriately so). Physical exam consistent with persistent asthmatic symptoms. However differential includes interstitial lung diseases like idiopathic pulmonary fibrosis secondary to the finding of somewhat "Velcro-like" lung sounds which would be consistent with interstitial lung disease. - Patient is a nonsmoker but her roommate smokes both inside and outside the house. I informed her that he would be in her best interest to get a new roommate. - Continue current asthma medication regimen. - Consider more aggressive workup if patient's symptoms persist.

## 2016-01-01 ENCOUNTER — Ambulatory Visit: Payer: Self-pay | Attending: Internal Medicine

## 2016-01-21 NOTE — Progress Notes (Deleted)
Subjective:     Patient ID: Holly Mueller, female   DOB: 1965-09-07, 50 y.o.   MRN: [id number]  HPI Holly Mueller is a 50yo female presenting today for general exam to follow up her history of ***.  # Asthma: - Currently using Albuterol (inhaler and nebulizer), Symbicort, Flonase, Singulair - Nonsmoker, but lives with roommate who smokes  # Hypothyroidism: - Currently using Synthroid 314mg  - Last TSH 02/2012 elevated to 16.153  # Hematuria: - Noted to have hematuria on last several UA. Last obtained from 2015. - CT from 05/2012 with nonobstructive nephrolithiasis noted  # Health Maintenance: - Last CMP 02/2014. Glucose elevated to 123. - HIV Nonreactive in 07/2008 - Last Mammogram 2012 - Last pap smear 12/2009 - Last cholesterol check in 04/2007 - Multiple attempts by Cardiology to schedule Myoview. Holly Mueller called back to schedule or answered phone calls.  Review of Systems     Objective:   Physical Exam     Assessment:     ***    Plan:     ***

## 2016-01-22 ENCOUNTER — Encounter: Payer: Self-pay | Admitting: Family Medicine

## 2016-02-11 ENCOUNTER — Encounter: Payer: Self-pay | Admitting: Internal Medicine

## 2016-02-11 ENCOUNTER — Ambulatory Visit (INDEPENDENT_AMBULATORY_CARE_PROVIDER_SITE_OTHER): Payer: Self-pay | Admitting: Internal Medicine

## 2016-02-11 VITALS — BP 121/78 | HR 73 | Temp 97.7°F | Ht 66.0 in | Wt 249.4 lb

## 2016-02-11 DIAGNOSIS — R059 Cough, unspecified: Secondary | ICD-10-CM

## 2016-02-11 DIAGNOSIS — R0602 Shortness of breath: Secondary | ICD-10-CM

## 2016-02-11 DIAGNOSIS — R05 Cough: Secondary | ICD-10-CM

## 2016-02-11 MED ORDER — ALBUTEROL SULFATE (2.5 MG/3ML) 0.083% IN NEBU: 2.5000 mg | INHALATION_SOLUTION | Freq: Four times a day (QID) | RESPIRATORY_TRACT | 2 refills | Status: DC | PRN

## 2016-02-11 MED ORDER — ALBUTEROL SULFATE HFA 108 (90 BASE) MCG/ACT IN AERS: INHALATION_SPRAY | RESPIRATORY_TRACT | 5 refills | Status: DC

## 2016-02-11 MED ORDER — PREDNISONE 20 MG PO TABS: ORAL_TABLET | ORAL | 0 refills | Status: DC

## 2016-02-11 MED ORDER — BENZONATATE 100 MG PO CAPS: 200.0000 mg | ORAL_CAPSULE | Freq: Three times a day (TID) | ORAL | 0 refills | Status: DC | PRN

## 2016-02-11 MED FILL — BENZONATATE 100 MG CAPSULE: 100 | 5 days supply | Qty: 30 | Fill #0

## 2016-02-11 MED FILL — predniSONE 20 MG TABS: 20 | 3 days supply | Qty: 9 | Fill #0

## 2016-02-11 MED FILL — ALBUTEROL 0.083% INHAL SOLN: (2.5 MG/3ML | 6 days supply | Qty: 75 | Fill #0

## 2016-02-11 MED FILL — VENTOLIN HFA 90 MCG INHALER: 108 (90 BAS | 25 days supply | Qty: 18 | Fill #0

## 2016-02-11 NOTE — Patient Instructions (Addendum)
It was nice meeting you today Holly Mueller!  Please begin using Dulera in place of Symbicort. Still inhale 2 puffs two times a day as you were with Symbicort.   Continue to use your albuterol inhaler and nebulizer as you have been.   Take prednisone 60 mg (three tablets) once a day for the next 3 days.   I have also placed a referral to pulmonology (lung specialist) for you. They will call you with the date and time of your appointment.   If you have any questions or concerns, please feel free to call the clinic.   Be well,  Dr. Avon Gully

## 2016-02-11 NOTE — Progress Notes (Signed)
   Subjective:    Patient ID: Holly Mueller, female    DOB: 28-Apr-1966, 50 y.o.   MRN: [id number]  HPI  Patient presents for same day appointment for worsening asthma.  Asthma Reports asthma symptoms worsening over past two days. Increasing SOB, especially with exertion. Some wheezing. Coughing a lot, though non-productive. Has been using Symbicort and ProAir as prescribed. No longer taking prednisone daily, as this was discontinued by previous provider. Has been using her albuterol nebulizer as well, which helps with symptoms some. Denies fever, chills. Was sick with URI symptoms about a week ago, and since those symptoms have resolved she has been in contact with multiple others both at work and home that are sick. Was taking Tessalon perles until she ran out last night, and says those did help her cough significantly. Has not taken anything else for cough.  Is a non-smoker. Her roommate still smokes, but has not lived in the house for over a month, and patient does not intend to let roommate continue to smoke when/if she does return.  Patient has seen pulm in the past, but has not continued to follow with them.   Review of Systems See HPI.    Objective:   Physical Exam  Constitutional: She is oriented to person, place, and time. She appears well-developed and well-nourished. No distress.  HENT:  Head: Normocephalic and atraumatic.  Pulmonary/Chest: No respiratory distress.  Prominent coarse breath sounds ("Velcro-like") in bases bilaterally. No wheezes or rhales. Normal WOB on RA. Able to speak in complete sentences.   Neurological: She is alert and oriented to person, place, and time.  Psychiatric: She has a normal mood and affect. Her behavior is normal.      Assessment & Plan:  Cough History of moderate persistent asthma that has acutely worsened in past two days. Possibly 2/2 asthma exacerbation, however patient with suspicious "Velcro-like" lung sounds present on two subsequent  examinations now, concerning for another process, specifically interstitial lung disease. Not in respiratory distress during encounter with O2 sat 97% on RA, and no fevers or other symptoms to indicate antibiotics. Will treat as asthma exacerbation for now, but have patient reestablish with pulmonology to address possible other underlying issues.  - Provided Dulera in place of Symbicort as patient having difficulty affording Symbicort - Refilled albuterol inhaler and neb - Prednisone burst - Referral to pulmonology  Adin Hector, MD, MPH PGY-2 [facility] Pager [phone]

## 2016-02-11 NOTE — Assessment & Plan Note (Signed)
History of moderate persistent asthma that has acutely worsened in past two days. Possibly 2/2 asthma exacerbation, however patient with suspicious "Velcro-like" lung sounds present on two subsequent examinations now, concerning for another process, specifically interstitial lung disease. Not in respiratory distress during encounter with O2 sat 97% on RA, and no fevers or other symptoms to indicate antibiotics. Will treat as asthma exacerbation for now, but have patient reestablish with pulmonology to address possible other underlying issues.  - Provided Dulera in place of Symbicort as patient having difficulty affording Symbicort - Refilled albuterol inhaler and neb - Prednisone burst - Referral to pulmonology

## 2016-03-06 ENCOUNTER — Telehealth: Payer: Self-pay | Admitting: Pulmonary Disease

## 2016-03-06 ENCOUNTER — Encounter: Payer: Self-pay | Admitting: Pulmonary Disease

## 2016-03-06 ENCOUNTER — Other Ambulatory Visit (INDEPENDENT_AMBULATORY_CARE_PROVIDER_SITE_OTHER): Payer: Self-pay

## 2016-03-06 ENCOUNTER — Ambulatory Visit (INDEPENDENT_AMBULATORY_CARE_PROVIDER_SITE_OTHER): Payer: Self-pay | Admitting: Pulmonary Disease

## 2016-03-06 VITALS — BP 128/68 | HR 68 | Ht 65.0 in | Wt 252.8 lb

## 2016-03-06 DIAGNOSIS — R0602 Shortness of breath: Secondary | ICD-10-CM

## 2016-03-06 DIAGNOSIS — J8410 Pulmonary fibrosis, unspecified: Secondary | ICD-10-CM

## 2016-03-06 DIAGNOSIS — R05 Cough: Secondary | ICD-10-CM

## 2016-03-06 DIAGNOSIS — K7460 Unspecified cirrhosis of liver: Secondary | ICD-10-CM

## 2016-03-06 DIAGNOSIS — R059 Cough, unspecified: Secondary | ICD-10-CM

## 2016-03-06 LAB — CBC WITH DIFFERENTIAL/PLATELET
BASOS PCT: 1.7 % (ref 0.0–3.0)
Basophils Absolute: 0 10*3/uL (ref 0.0–0.1)
EOS ABS: 0.1 10*3/uL (ref 0.0–0.7)
Eosinophils Relative: 4.1 % (ref 0.0–5.0)
HCT: 35.8 % — ABNORMAL LOW (ref 36.0–46.0)
Hemoglobin: 12.5 g/dL (ref 12.0–15.0)
LYMPHS ABS: 0.6 10*3/uL — AB (ref 0.7–4.0)
Lymphocytes Relative: 35.7 % (ref 12.0–46.0)
MCHC: 35 g/dL (ref 30.0–36.0)
MCV: 85.2 fl (ref 78.0–100.0)
MONO ABS: 0.2 10*3/uL (ref 0.1–1.0)
Monocytes Relative: 10.7 % (ref 3.0–12.0)
NEUTROS PCT: 47.8 % (ref 43.0–77.0)
Neutro Abs: 0.8 10*3/uL — ABNORMAL LOW (ref 1.4–7.7)
Platelets: 78 10*3/uL — ABNORMAL LOW (ref 150.0–400.0)
RBC: 4.2 Mil/uL (ref 3.87–5.11)
RDW: 13.9 % (ref 11.5–15.5)
WBC: 1.6 10*3/uL — CL (ref 4.0–10.5)

## 2016-03-06 MED ORDER — AZELASTINE HCL 0.1 % NA SOLN: 1.0000 | Freq: Two times a day (BID) | NASAL | 5 refills | Status: DC

## 2016-03-06 MED ORDER — CHLORPHENIRAMINE MALEATE 4 MG PO TABS: 8.0000 mg | ORAL_TABLET | Freq: Three times a day (TID) | ORAL | 2 refills | Status: DC

## 2016-03-06 NOTE — Progress Notes (Signed)
Holly Mueller    [id number]    1966-04-03  Primary Care Physician:Arbyrd Gerlean Ren, DO  Referring Physician: Verner Mould, MD [address] [place], [place] [address]  Chief complaint:  Consult for evaluation of asthma  HPI: Holly Mueller is a 50 year old with past history of asthma. She has poor control over the past year and needed to go on several tapers of prednisone. She was seen in the ED in July 2017 for an exacerbation. She is currently maintained on Symbicort and albuterol. She needs to use her albuterol every day. She denies any nighttime awakenings. She also has significant issues with sinusitis, postnasal drip with constant clearing of the throat. She has heartburn and is on Prilosec.   Outpatient Encounter Prescriptions as of 03/06/2016  Medication Sig  . albuterol (PROAIR HFA) 108 (90 Base) MCG/ACT inhaler INHALE 2 PUFFS INTO THE LUNGS EVERY 6 HOURS AS NEEDED FOR SHORTNESS OF BREATH  . albuterol (PROVENTIL) (2.5 MG/3ML) 0.083% nebulizer solution Take 3 mLs (2.5 mg total) by nebulization every 6 (six) hours as needed for wheezing or shortness of breath.  . benzonatate (TESSALON) 100 MG capsule Take 2 capsules (200 mg total) by mouth 3 (three) times daily as needed for cough.  . budesonide-formoterol (SYMBICORT) 160-4.5 MCG/ACT inhaler Inhale 2 puffs into the lungs 2 (two) times daily.  . fluticasone (FLONASE) 50 MCG/ACT nasal spray Place 2 sprays into both nostrils daily.  Marland Kitchen levothyroxine (SYNTHROID, LEVOTHROID) 300 MCG tablet Take 1 tablet (300 mcg total) by mouth daily.  . meloxicam (MOBIC) 15 MG tablet Take 1 tablet (15 mg total) by mouth daily.  . montelukast (SINGULAIR) 10 MG tablet Take 1 tablet (10 mg total) by mouth daily.  . naproxen (NAPROSYN) 500 MG tablet Take 1 tablet (500 mg total) by mouth 2 (two) times daily.  Marland Kitchen omeprazole (PRILOSEC) 40 MG capsule TAKE 1 CAPSULE BY MOUTH DAILY. *TAKE 30 MINUTES BEFORE BREAKFAST*  . [DISCONTINUED] predniSONE  (DELTASONE) 20 MG tablet Take 2 tablets (40 mg total) by mouth daily.  . [DISCONTINUED] predniSONE (DELTASONE) 20 MG tablet 3 tabs po daily x 3 days starting on 11/17/15   No facility-administered encounter medications on file as of 03/06/2016.     Allergies as of 03/06/2016 - Review Complete 03/06/2016  Allergen Reaction Noted  . Aspirin Nausea Only 03/29/2012    Past Medical History:  Diagnosis Date  . Arthritis    bursitis left hip flares-not an issue  . Asthma 2004  . Edentulous    10-19-13 at present  . Epilepsy ([place]) in 1972   No seizures since 1972. Previously treated with phenobarbital.   . GERD (gastroesophageal reflux disease) 2008  . Headache(784.0)    hx of migraines   . Hypothyroidism 2008  . Lower esophageal ring 08/18/2013  . Seasonal allergies 2003  . Shortness of breath     Past Surgical History:  Procedure Laterality Date  . BALLOON DILATION N/A 10/24/2013   Procedure: BALLOON DILATION;  Surgeon: Gatha Mayer, MD;  Location: [facility];  Service: Endoscopy;  Laterality: N/A;  . CESAREAN SECTION     x2  . DENTAL SURGERY     multiple extractions 3'15  . ESOPHAGOGASTRODUODENOSCOPY N/A 10/24/2013   Procedure: ESOPHAGOGASTRODUODENOSCOPY (EGD);  Surgeon: Gatha Mayer, MD;  Location: [facility];  Service: Endoscopy;  Laterality: N/A;  . LIVER BIOPSY  06/30/2012   Procedure: LIVER BIOPSY;  Surgeon: Shann Medal, MD;  Location: [facility];  Service: General;;  . NOVASURE ABLATION    . SHOULDER ARTHROSCOPY Right 2011  . UMBILICAL HERNIA REPAIR N/A 06/30/2012   Procedure: remove umbilicus;  Surgeon: Shann Medal, MD;  Location: [facility];  Service: General;  Laterality: N/A;  . VENTRAL HERNIA REPAIR N/A 06/30/2012   Procedure: LAPAROSCOPIC VENTRAL HERNIA;  Surgeon: Shann Medal, MD;  Location: [facility];  Service: General;  Laterality: N/A;  With Mesh  . WISDOM TOOTH EXTRACTION      Family History  Problem Relation Age of Onset  . Hypertension Mother   .  Diabetes Mother   . Lung cancer Father   . Stroke Father   . Diabetes Sister   . Hypertension Sister     Social History   Social History  . Marital status: Single    Spouse name: N/A  . Number of children: 2  . Years of education: N/A   Occupational History  .  [employer]   Social History Main Topics  . Smoking status: Never Smoker  . Smokeless tobacco: Never Used  . Alcohol use 0.0 oz/week     Comment: occ  . Drug use: No  . Sexual activity: No   Other Topics Concern  . Not on file   Social History Narrative   Works in Estate manager/land agent at [employer].    Lives with 12 and 14 (boys).    Also living with close friends of the family (mother and two kids, adults).            Review of systems: Review of Systems  Constitutional: Negative for fever and chills.  HENT: Negative.   Eyes: Negative for blurred vision.  Respiratory: as per HPI  Cardiovascular: Negative for chest pain and palpitations.  Gastrointestinal: Negative for vomiting, diarrhea, blood per rectum. Genitourinary: Negative for dysuria, urgency, frequency and hematuria.  Musculoskeletal: Negative for myalgias, back pain and joint pain.  Skin: Negative for itching and rash.  Neurological: Negative for dizziness, tremors, focal weakness, seizures and loss of consciousness.  Endo/Heme/Allergies: Negative for environmental allergies.  Psychiatric/Behavioral: Negative for depression, suicidal ideas and hallucinations.  All other systems reviewed and are negative.   Physical Exam: Blood pressure 128/68, pulse 68, height _0  (1.651 m), weight 252 lb 12.8 oz (114.7 kg), SpO2 97 %. Gen:      No acute distress HEENT:  EOMI, sclera anicteric Neck:     No masses; no thyromegaly Lungs:    Basal crackles; normal respiratory effort CV:         Regular rate and rhythm; no murmurs Abd:      + bowel sounds; soft, non-tender; no palpable masses, no distension Ext:    No edema; adequate peripheral  perfusion Skin:      Warm and dry; no rash Neuro: alert and oriented x 3 Psych: normal mood and affect  Data Reviewed: Chest x-ray 04/05/15- bibasilar opacities. Chest x-ray 11/16/15-left basal scarring. All images reviewed.  Assessment:  Poorly controlled asthma. Upper airway cough syndrome. She is maintained on Symbicort but has had several exacerbations this year. Her postnasal drip and acid reflux are likely contributing to poor control. I will be more aggressive with her postnasal drip. She gets started on chlorphentermine antihistamine and Astelin nasal spray in addition to the Flonase. She'll continue on the Prilosec and Symbicort. She will be scheduled for pulmonary function tests and a CBC differential and a blood allergy profile. She was unable to complete an FENO  in office today.  Bibasal opacities This has been persistent for several years. She does have basal fine crackles similar to that noted in her primary care office. I'll get a high-resolution CT for further evaluation.  Plan/Recommendations: - PFTs, CBC with diff, blood allergy profile - High res CT of chest - Start chlorpheniramine 8 mg tid and astelin spray - Continue flonase, symbicort, prilosec.  Marshell Garfinkel MD [facility] Pager [phone] 03/06/2016, 10:45 AM  CC: [place]

## 2016-03-06 NOTE — Patient Instructions (Signed)
We will schedule for a high-resolution CT of the chest and pulmonary function test. We start you on chlorpheniramine 8 mg 3 times daily and Astelin nasal spray twice a day.  Continue using the Flonase. Continue using the Prilosec. We'll send for blood work today including CBC with differential and a blood allergy profile.  Return to clinic in 1 month.

## 2016-03-06 NOTE — Telephone Encounter (Signed)
Received call from Santiago Glad in lab - critical WBC at 1.6 K/uL.   Will send to Dr. Vaughan Browner to make aware to advise for recs if necessary. Thanks.

## 2016-03-08 NOTE — Telephone Encounter (Addendum)
Reviewed labs. There is a progressive decline in WBC and platelets since 2015. This may be due to cirrhosis (seen on liver biopsy from 2014). Please send results to primary care team for follow up and place a referral for a GI evaluation.

## 2016-03-09 LAB — RESPIRATORY ALLERGY PROFILE REGION II ~~LOC~~
Allergen, A. alternata, m6: <0.1 kU/L
Allergen, Cottonwood, t14: <0.1 kU/L
Allergen, D pternoyssinus,d7: <0.1 kU/L
Allergen, Oak,t7: <0.1 kU/L
Aspergillus fumigatus, m3: <0.1 kU/L
Bermuda Grass: <0.1 kU/L
Cat Dander: <0.1 kU/L
D. farinae: <0.1 kU/L
Dog Dander: <0.1 kU/L
Johnson Grass: <0.1 kU/L
Pecan/Hickory Tree IgE: <0.1 kU/L
Rough Pigweed  IgE: <0.1 kU/L
Timothy Grass: <0.1 kU/L

## 2016-03-09 NOTE — Telephone Encounter (Signed)
Attempted to call the pt to make her aware of her lab results and referral for GI.  pts phone stated that the pt could not be reached at this time. Referral placed for GI.  Will try to contact the pt later. Labs have been faxed to her PCP.

## 2016-03-13 ENCOUNTER — Inpatient Hospital Stay: Admission: RE | Admit: 2016-03-13 | Payer: Self-pay | Source: Ambulatory Visit

## 2016-04-07 ENCOUNTER — Inpatient Hospital Stay (HOSPITAL_COMMUNITY): Admission: RE | Admit: 2016-04-07 | Payer: Self-pay | Source: Ambulatory Visit

## 2016-04-07 ENCOUNTER — Ambulatory Visit: Payer: Self-pay | Admitting: Pulmonary Disease

## 2016-04-17 ENCOUNTER — Ambulatory Visit: Payer: Self-pay | Admitting: Family Medicine

## 2016-05-12 ENCOUNTER — Encounter: Payer: Self-pay | Admitting: Pulmonary Disease

## 2016-06-10 ENCOUNTER — Emergency Department (HOSPITAL_COMMUNITY)
Admission: EM | Admit: 2016-06-10 | Discharge: 2016-06-10 | Disposition: A | Discharge status: Home / Self Care | Payer: Self-pay | Attending: Emergency Medicine | Admitting: Emergency Medicine

## 2016-06-10 ENCOUNTER — Emergency Department (HOSPITAL_COMMUNITY): Payer: Self-pay

## 2016-06-10 ENCOUNTER — Encounter (HOSPITAL_COMMUNITY): Payer: Self-pay | Admitting: Emergency Medicine

## 2016-06-10 DIAGNOSIS — R69 Illness, unspecified: Secondary | ICD-10-CM

## 2016-06-10 DIAGNOSIS — J111 Influenza due to unidentified influenza virus with other respiratory manifestations: Secondary | ICD-10-CM | POA: Insufficient documentation

## 2016-06-10 DIAGNOSIS — E039 Hypothyroidism, unspecified: Secondary | ICD-10-CM | POA: Insufficient documentation

## 2016-06-10 DIAGNOSIS — J45909 Unspecified asthma, uncomplicated: Secondary | ICD-10-CM | POA: Insufficient documentation

## 2016-06-10 MED ORDER — PREDNISONE 10 MG (21) PO TBPK: ORAL_TABLET | ORAL | 0 refills | Status: DC

## 2016-06-10 MED ORDER — BENZONATATE 100 MG PO CAPS: 100.0000 mg | ORAL_CAPSULE | Freq: Three times a day (TID) | ORAL | 0 refills | Status: DC

## 2016-06-10 MED FILL — BENZONATATE 100 MG CAP: 100 | 7 days supply | Qty: 21 | Fill #0

## 2016-06-10 MED FILL — predniSONE 10 MG TABS: 10 | 6 days supply | Qty: 21 | Fill #0

## 2016-06-10 NOTE — Discharge Instructions (Signed)
Your symptoms are consistent with a viral illness, such as the flu. Viruses do not require antibiotics. Treatment is symptomatic care and it is important to note that these symptoms may last for 7-14 days. Symptoms will be intensified and complicated by dehydration. Dehydration can also extend the duration of symptoms. Drink plenty of fluids and get plenty of rest. You should be drinking at least half a liter of water an hour to stay hydrated. Electrolyte drinks are also encouraged. You should be drinking enough fluids to make your urine light yellow, almost clear. If this is not the case, you are not drinking enough water. Ibuprofen, Naproxen, or Tylenol for pain or fever. Tessalon for cough. Plain Mucinex may help relieve congestion. Saline sinus rinses and saline nasal sprays may also help relieve congestion. Warm liquids or Chloraseptic spray may help soothe a sore throat. Follow up with a primary care provider, as needed, for any future management of this issue.

## 2016-06-10 NOTE — ED Provider Notes (Signed)
[facility] DEPT Provider Note   CSN: [id number] Arrival date & time: 06/10/16  4158     History   Chief Complaint Chief Complaint  Patient presents with  . flu sx  . Generalized Body Aches    HPI Holly Mueller is a 51 y.o. female.  HPI   Holly Mueller is a 50 y.o. female, with a history of migraines, presenting to the ED with productive cough with green sputum, generalized headache, nasal congestion, body aches, and subjective fever. Has tried tylenol sinus, mucinex, and sudafed without complete relief.   Denies chest pain, shortness of breath, N/V/D, abdominal pain, or any other complaints.       Past Medical History:  Diagnosis Date  . Arthritis    bursitis left hip flares-not an issue  . Asthma 2004  . Edentulous    10-19-13 at present  . Epilepsy ([place]) in 1972   No seizures since 1972. Previously treated with phenobarbital.   . GERD (gastroesophageal reflux disease) 2008  . Headache(784.0)    hx of migraines   . Hypothyroidism 2008  . Lower esophageal ring 08/18/2013  . Seasonal allergies 2003  . Shortness of breath     Patient Active Problem List   Diagnosis Date Noted  . Cough 12/09/2015  . Chronic pain of left thumb 10/09/2015  . Precordial chest pain 08/03/2015  . Abscess of skin of abdomen 07/10/2014  . Bursitis of left hip 09/14/2013  . Lower esophageal ring 08/18/2013  . Dyspnea 08/17/2013  . Ganglion cyst of wrist 08/24/2012  . Hip pain, bilateral 08/23/2012  . Umbilical hernia 30/94/0768  . Healthcare maintenance 02/14/2012  . Osteoarthritis 02/12/2012  . Elevated LFTs 10/30/2011  . Allergic rhinitis 08/26/2011  . Obesity 07/08/2010  . SEIZURES, HX OF 12/12/2009  . DENTAL CARIES 12/18/2008  . DEPRESSION 07/26/2008  . MIGRAINE HEADACHE 11/15/2007  . HYPOTHYROIDISM NOS 10/12/2006  . ASTHMA, EXTRINSIC NOS 07/30/2006    Past Surgical History:  Procedure Laterality Date  . BALLOON DILATION N/A 10/24/2013   Procedure: BALLOON  DILATION;  Surgeon: Gatha Mayer, MD;  Location: [facility];  Service: Endoscopy;  Laterality: N/A;  . CESAREAN SECTION     x2  . DENTAL SURGERY     multiple extractions 3'15  . ESOPHAGOGASTRODUODENOSCOPY N/A 10/24/2013   Procedure: ESOPHAGOGASTRODUODENOSCOPY (EGD);  Surgeon: Gatha Mayer, MD;  Location: [facility];  Service: Endoscopy;  Laterality: N/A;  . LIVER BIOPSY  06/30/2012   Procedure: LIVER BIOPSY;  Surgeon: Shann Medal, MD;  Location: [facility];  Service: General;;  . NOVASURE ABLATION    . SHOULDER ARTHROSCOPY Right 2011  . UMBILICAL HERNIA REPAIR N/A 06/30/2012   Procedure: remove umbilicus;  Surgeon: Shann Medal, MD;  Location: [facility];  Service: General;  Laterality: N/A;  . VENTRAL HERNIA REPAIR N/A 06/30/2012   Procedure: LAPAROSCOPIC VENTRAL HERNIA;  Surgeon: Shann Medal, MD;  Location: [facility];  Service: General;  Laterality: N/A;  With Mesh  . WISDOM TOOTH EXTRACTION      OB History    No data available       Home Medications    Prior to Admission medications   Medication Sig Start Date End Date Taking? Authorizing Provider  acetaminophen (TYLENOL) 325 MG tablet Take 650 mg by mouth every 6 (six) hours as needed for mild pain.   Yes Historical Provider, MD  albuterol (PROAIR HFA) 108 (90 Base) MCG/ACT inhaler INHALE 2 PUFFS INTO THE LUNGS EVERY 6 HOURS AS  NEEDED FOR SHORTNESS OF BREATH 02/11/16  Yes Verner Mould, MD  albuterol (PROVENTIL) (2.5 MG/3ML) 0.083% nebulizer solution Take 3 mLs (2.5 mg total) by nebulization every 6 (six) hours as needed for wheezing or shortness of breath. 02/11/16  Yes Verner Mould, MD  azelastine (ASTELIN) 0.1 % nasal spray Place 1 spray into both nostrils 2 (two) times daily. Use in each nostril as directed 03/06/16  Yes Praveen Mannam, MD  budesonide-formoterol (SYMBICORT) 160-4.5 MCG/ACT inhaler Inhale 2 puffs into the lungs 2 (two) times daily. 12/09/15  Yes Elberta Leatherwood, MD    Chlorpheniramine-PSE-Ibuprofen (ADVIL ALLERGY SINUS PO) Take 1 tablet by mouth daily as needed (allergies).   Yes Historical Provider, MD  fluticasone (FLONASE) 50 MCG/ACT nasal spray Place 2 sprays into both nostrils daily.   Yes Historical Provider, MD  guaiFENesin (MUCINEX) 600 MG 12 hr tablet Take 600 mg by mouth 2 (two) times daily as needed for cough.   Yes Historical Provider, MD  levothyroxine (SYNTHROID, LEVOTHROID) 300 MCG tablet Take 1 tablet (300 mcg total) by mouth daily. 12/09/15  Yes Elberta Leatherwood, MD  meloxicam (MOBIC) 15 MG tablet Take 1 tablet (15 mg total) by mouth daily. 10/09/15  Yes Bryan R Hess, DO  montelukast (SINGULAIR) 10 MG tablet Take 1 tablet (10 mg total) by mouth daily. 12/09/15  Yes Elberta Leatherwood, MD  omeprazole (PRILOSEC) 40 MG capsule TAKE 1 CAPSULE BY MOUTH DAILY. *TAKE 30 MINUTES BEFORE BREAKFAST* 12/09/15  Yes Elberta Leatherwood, MD  pseudoephedrine (SUDAFED) 30 MG tablet Take 30 mg by mouth every 4 (four) hours as needed for congestion.   Yes Historical Provider, MD  pseudoephedrine-acetaminophen (TYLENOL SINUS) 30-500 MG TABS tablet Take 1 tablet by mouth every 4 (four) hours as needed (sinus pain).   Yes Historical Provider, MD  Pseudoephedrine-APAP-DM (DAYQUIL MULTI-SYMPTOM COLD/FLU PO) Take 1 capsule by mouth 2 (two) times daily as needed (sinus pain).   Yes Historical Provider, MD  benzonatate (TESSALON) 100 MG capsule Take 1 capsule (100 mg total) by mouth every 8 (eight) hours. 06/10/16   Shawn C Joy, PA-C  chlorpheniramine (CHLORPHEN) 4 MG tablet Take 2 tablets (8 mg total) by mouth 3 (three) times daily. Patient not taking: Reported on 06/10/2016 03/06/16   Marshell Garfinkel, MD  naproxen (NAPROSYN) 500 MG tablet Take 1 tablet (500 mg total) by mouth 2 (two) times daily. Patient not taking: Reported on 06/10/2016 06/15/15   Olivia Canter Sam, PA-C  predniSONE (STERAPRED UNI-PAK 21 TAB) 10 MG (21) TBPK tablet Take 6 tabs day 1, 5 tabs day 2, 4 tabs day 3, 3 tabs day 4, 2 tabs  day 5, and 1 tab on day 6. 06/10/16   Lorayne Bender, PA-C    Family History Family History  Problem Relation Age of Onset  . Hypertension Mother   . Diabetes Mother   . Lung cancer Father   . Stroke Father   . Diabetes Sister   . Hypertension Sister     Social History Social History  Substance Use Topics  . Smoking status: Never Smoker  . Smokeless tobacco: Never Used  . Alcohol use 0.0 oz/week     Comment: occ     Allergies   Aspirin   Review of Systems Review of Systems  Constitutional: Positive for fever (subjective).  HENT: Positive for congestion and rhinorrhea.   Respiratory: Positive for cough. Negative for shortness of breath.   Cardiovascular: Negative for chest pain.  Gastrointestinal: Negative for abdominal  pain, diarrhea, nausea and vomiting.  Musculoskeletal: Positive for myalgias.  Neurological: Positive for headaches.  All other systems reviewed and are negative.    Physical Exam Updated Vital Signs BP 118/76 (BP Location: Left Arm)   Pulse 69   Temp 98.5 F (36.9 C) (Oral)   Resp 19   Ht 5' 5" (1.651 m)   Wt 115.2 kg   SpO2 95%   BMI 42.27 kg/m   Physical Exam  Constitutional: She appears well-developed and well-nourished. No distress.  HENT:  Head: Normocephalic and atraumatic.  Eyes: Conjunctivae are normal.  Neck: Neck supple.  Cardiovascular: Normal rate, regular rhythm, normal heart sounds and intact distal pulses.   Pulmonary/Chest: Effort normal and breath sounds normal. No respiratory distress.  Abdominal: Soft. There is no tenderness. There is no guarding.  Musculoskeletal: She exhibits no edema.  Lymphadenopathy:    She has no cervical adenopathy.  Neurological: She is alert.  Skin: Skin is warm and dry. She is not diaphoretic.  Psychiatric: She has a normal mood and affect. Her behavior is normal.  Nursing note and vitals reviewed.    ED Treatments / Results  Labs (all labs ordered are listed, but only abnormal results  are displayed) Labs Reviewed - No data to display  EKG  EKG Interpretation None       Radiology Dg Chest 2 View  Result Date: 06/10/2016 CLINICAL DATA:  Fever, productive cough, body aches for 4 days EXAM: CHEST  2 VIEW COMPARISON:  11/16/2015 FINDINGS: Cardiomediastinal silhouette is stable. Subtle mild perihilar bronchitic changes. No infiltrate or pulmonary edema. Stable streaky bilateral basilar scarring left greater than right. IMPRESSION: Subtle mild perihilar bronchitic changes. No infiltrate or pulmonary edema. Stable streaky bilateral basilar scarring left greater than right. Electronically Signed   By: Lahoma Crocker M.D.   On: 06/10/2016 10:52    Procedures Procedures (including critical care time)  Medications Ordered in ED Medications - No data to display   Initial Impression / Assessment and Plan / ED Course  I have reviewed the triage vital signs and the nursing notes.  Pertinent labs & imaging results that were available during my care of the patient were reviewed by me and considered in my medical decision making (see chart for details).      Patient presents with influenza-like symptoms. She is nontoxic appearing vital signs are normal. No evidence of infiltrate on chest x-ray. PCP follow-up as needed. Home care and return precautions discussed. Patient voices understanding of all instructions and is comfortable with discharge.  Vitals:   06/10/16 1001 06/10/16 1014 06/10/16 1248  BP: 118/76  123/81  Pulse: 69  73  Resp: 19  18  Temp: 98.5 F (36.9 C)  98.8 F (37.1 C)  TempSrc: Oral  Oral  SpO2: 95%  99%  Weight:  115.2 kg   Height:  5' 5" (1.651 m)      Final Clinical Impressions(s) / ED Diagnoses   Final diagnoses:  Influenza-like illness    New Prescriptions Discharge Medication List as of 06/10/2016 11:22 AM    START taking these medications   Details  predniSONE (STERAPRED UNI-PAK 21 TAB) 10 MG (21) TBPK tablet Take 6 tabs day 1, 5 tabs  day 2, 4 tabs day 3, 3 tabs day 4, 2 tabs day 5, and 1 tab on day 6., Print         Lorayne Bender, PA-C 06/10/16 [place], MD 06/10/16 1943

## 2016-06-10 NOTE — ED Triage Notes (Signed)
Pt has had body aches and cold sx that started last Friday-- fev er started last night-- has been using OTC meds without relief. Cough-- productive for mucus-- greenish yellow.

## 2016-06-18 ENCOUNTER — Ambulatory Visit (INDEPENDENT_AMBULATORY_CARE_PROVIDER_SITE_OTHER): Payer: Self-pay | Admitting: Internal Medicine

## 2016-06-18 ENCOUNTER — Encounter: Payer: Self-pay | Admitting: Internal Medicine

## 2016-06-18 VITALS — BP 122/66 | HR 83 | Temp 98.1°F | Ht 65.0 in | Wt 253.4 lb

## 2016-06-18 DIAGNOSIS — R05 Cough: Secondary | ICD-10-CM

## 2016-06-18 DIAGNOSIS — R059 Cough, unspecified: Secondary | ICD-10-CM

## 2016-06-18 MED ORDER — BENZONATATE 100 MG PO CAPS: 100.0000 mg | ORAL_CAPSULE | Freq: Three times a day (TID) | ORAL | 0 refills | Status: DC

## 2016-06-18 MED ORDER — GUAIFENESIN-CODEINE 100-10 MG/5ML PO SYRP: 5.0000 mL | ORAL_SOLUTION | Freq: Three times a day (TID) | ORAL | 0 refills | Status: DC | PRN

## 2016-06-18 MED FILL — CHERATUSSIN AC SYRUP: 100-10 | 8 days supply | Qty: 120 | Fill #0

## 2016-06-18 MED FILL — BENZONATATE 100 MG CAP: 100 | 10 days supply | Qty: 30 | Fill #0

## 2016-06-18 NOTE — Patient Instructions (Signed)
A viral cough can linger for several weeks. If you are still having a significant cough in two weeks I would recommend being seen again.   If you start having fevers, have severe chest pain, have shortness of breath even with your breathing treatments then you need to be seen again.   I have refilled your Tessalon. It is safe to take with the cough syrup. You can continue to use things like your allergy medication, Flonase etc.

## 2016-06-18 NOTE — Progress Notes (Signed)
   Subjective:    Holly Mueller - 51 y.o. female MRN [id number]  Date of birth: September 06, 1965  HPI  Holly Mueller is here for SDA for cough.  COUGH  Patient has a history of asthma. Asthma not always well controlled. Seen in ED 9 days ago and told she had a viral influenza like illness. Was treated with Prednisone 6 day taper and Tessalon. Finished course of Prednisone and only has a couple of the Tessalon left.  Has been coughing for >14 days. Cough is improving.  Cough is: worse when lying flat and at night  Sputum production: Was previously productive but now dry.  Medications tried: Tessalon, Prednisone taper, Mucinex, OTC cough syrup, Sudafed  Taking blood pressure medications: no   Symptoms Runny nose: yes  Mucous in back of throat: yes  Throat burning or reflux: no Wheezing or asthma: yes  Fever: Febrile prior to ED visit  Chest Pain: no Shortness of breath: with coughing  Leg swelling: no  Hemoptysis: no Weight loss: no  ROS see HPI Smoking Status noted  -  reports that she has never smoked. She has never used smokeless tobacco. - Review of Systems: Per HPI. - Past Medical History: Patient Active Problem List   Diagnosis Date Noted  . Cough 12/09/2015  . Chronic pain of left thumb 10/09/2015  . Precordial chest pain 08/03/2015  . Abscess of skin of abdomen 07/10/2014  . Bursitis of left hip 09/14/2013  . Lower esophageal ring 08/18/2013  . Dyspnea 08/17/2013  . Ganglion cyst of wrist 08/24/2012  . Hip pain, bilateral 08/23/2012  . Umbilical hernia 60/08/5995  . Healthcare maintenance 02/14/2012  . Osteoarthritis 02/12/2012  . Elevated LFTs 10/30/2011  . Allergic rhinitis 08/26/2011  . Obesity 07/08/2010  . SEIZURES, HX OF 12/12/2009  . DENTAL CARIES 12/18/2008  . DEPRESSION 07/26/2008  . MIGRAINE HEADACHE 11/15/2007  . HYPOTHYROIDISM NOS 10/12/2006  . ASTHMA, EXTRINSIC NOS 07/30/2006   - Medications: reviewed and updated   Objective:   Physical  Exam BP 122/66   Pulse 83   Temp 98.1 F (36.7 C) (Oral)   Ht _0  (1.651 m)   Wt 253 lb 6.4 oz (114.9 kg)   SpO2 98%   BMI 42.17 kg/m  Gen: NAD, alert, cooperative with exam, appears ill but non-toxic  HEENT: NCAT, PERRL, clear conjunctiva, oropharynx clear erythematous without exudates, TMs normal bilaterally  CV: RRR, good S1/S2, no murmur, no edema, capillary refill brisk  Resp: CTABL, no wheezes, non-labored, moving air well in all lung fields   Assessment & Plan:   Cough Suspect viral etiology and a lingering post viral cough. Patient reports improvement in cough and has not been febrile for >10 days. Lungs clear so does not appear to be having an asthma exacerbation, although asthma could potentially slow her recovery. Patient is afebrile and without hypoxia so doubt PNA with clear lung exam. CXR from 1/31 also negative for infiltrate or edema.  -Rx for Guaifenesin-codeine given  -refill for Tessalon given  -continue to use Albuterol prn and other supportive care medications  -return precautions discussed  -if cough not improved in 1-2 weeks would consider repeat CXR     Phill Myron, D.O. 06/18/2016, 2:46 PM PGY-2, [place]

## 2016-06-18 NOTE — Assessment & Plan Note (Signed)
Suspect viral etiology and a lingering post viral cough. Patient reports improvement in cough and has not been febrile for >10 days. Lungs clear so does not appear to be having an asthma exacerbation, although asthma could potentially slow her recovery. Patient is afebrile and without hypoxia so doubt PNA with clear lung exam. CXR from 1/31 also negative for infiltrate or edema.  -Rx for Guaifenesin-codeine given  -refill for Tessalon given  -continue to use Albuterol prn and other supportive care medications  -return precautions discussed  -if cough not improved in 1-2 weeks would consider repeat CXR

## 2016-09-10 ENCOUNTER — Ambulatory Visit: Payer: Self-pay | Admitting: Family Medicine

## 2016-10-23 ENCOUNTER — Encounter: Payer: Self-pay | Admitting: Family Medicine

## 2016-10-23 ENCOUNTER — Encounter (INDEPENDENT_AMBULATORY_CARE_PROVIDER_SITE_OTHER): Payer: Medicaid Other | Admitting: Student

## 2016-10-23 ENCOUNTER — Encounter: Payer: Self-pay | Admitting: Student

## 2016-10-23 VITALS — BP 116/70 | HR 82 | Temp 98.1°F | Wt 260.0 lb

## 2016-10-23 DIAGNOSIS — R059 Cough, unspecified: Secondary | ICD-10-CM

## 2016-10-23 DIAGNOSIS — R05 Cough: Secondary | ICD-10-CM | POA: Diagnosis not present

## 2016-10-23 DIAGNOSIS — D696 Thrombocytopenia, unspecified: Secondary | ICD-10-CM

## 2016-10-23 DIAGNOSIS — R040 Epistaxis: Secondary | ICD-10-CM | POA: Diagnosis not present

## 2016-10-23 DIAGNOSIS — R0600 Dyspnea, unspecified: Secondary | ICD-10-CM | POA: Diagnosis not present

## 2016-10-23 DIAGNOSIS — J309 Allergic rhinitis, unspecified: Secondary | ICD-10-CM | POA: Diagnosis not present

## 2016-10-23 DIAGNOSIS — K219 Gastro-esophageal reflux disease without esophagitis: Secondary | ICD-10-CM | POA: Diagnosis not present

## 2016-10-23 MED ORDER — ALBUTEROL SULFATE (2.5 MG/3ML) 0.083% IN NEBU: 2.5000 mg | INHALATION_SOLUTION | Freq: Four times a day (QID) | RESPIRATORY_TRACT | 2 refills | Status: DC | PRN

## 2016-10-23 MED ORDER — BUDESONIDE-FORMOTEROL FUMARATE 160-4.5 MCG/ACT IN AERO: 2.0000 | INHALATION_SPRAY | Freq: Two times a day (BID) | RESPIRATORY_TRACT | 2 refills | Status: DC

## 2016-10-23 MED ORDER — BENZONATATE 100 MG PO CAPS: 100.0000 mg | ORAL_CAPSULE | Freq: Three times a day (TID) | ORAL | 0 refills | Status: DC

## 2016-10-23 MED ORDER — PANTOPRAZOLE SODIUM 40 MG PO TBEC: 40.0000 mg | DELAYED_RELEASE_TABLET | Freq: Every day | ORAL | 3 refills | Status: DC

## 2016-10-23 MED ORDER — ALBUTEROL SULFATE HFA 108 (90 BASE) MCG/ACT IN AERS: INHALATION_SPRAY | RESPIRATORY_TRACT | 5 refills | Status: DC

## 2016-10-23 MED ORDER — PREDNISONE 50 MG PO TABS: 50.0000 mg | ORAL_TABLET | Freq: Every day | ORAL | 0 refills | Status: DC

## 2016-10-23 MED FILL — PROAIR HFA 90 MCG INHALER: 108 (90 BAS | 25 days supply | Qty: 9 | Fill #0

## 2016-10-23 MED FILL — LEVOTHYROXINE 300 MCG TAB: 300 | 30 days supply | Qty: 30 | Fill #1

## 2016-10-23 MED FILL — SYMBICORT 160-4.5 MCG INH: 160-4.5 | 30 days supply | Qty: 10 | Fill #0

## 2016-10-23 MED FILL — ALBUTEROL 0.083% INHAL SOLN: (2.5 MG/3ML | 6 days supply | Qty: 75 | Fill #0

## 2016-10-23 MED FILL — BENZONATATE 100 MG CAP: 100 | 10 days supply | Qty: 30 | Fill #0

## 2016-10-23 MED FILL — predniSONE 50 MG TABS: 50 | 5 days supply | Qty: 5 | Fill #0

## 2016-10-23 MED FILL — PANTOPRAZOLE SOD DR 40 MG T: 40 | 30 days supply | Qty: 30 | Fill #0

## 2016-10-23 NOTE — Patient Instructions (Addendum)
It was great seeing you today! We have addressed the following issues today 1. Cough and shortness of breath: I have sent a prescription for prednisone to your pharmacy. Please pick up this prescription and start taking it today. Continue using his Symbicort and albuterol. I also ordered a referral to pulmonologist. Someone will get in touch with you in the next couple of weeks. It is very important that you go to you pulmonologist appointment.  2.   Acid reflux: I have changing the medication to Protonix. Take this medication in the morning about 30 minutes before breakfast for the next 6-8 weeks.   3.  Epistaxis/nosebleeding: Use your Flonase as we discussed in clinic. Avoid spraying Flonase to your nasal septum.   If we did any lab work today, and the results require attention, either me or my nurse will get in touch with you. If everything is normal, you will get a letter in mail and a message via . If you don't hear from [place] in two weeks, please give [place] a call. Otherwise, we look forward to seeing you again at your next visit. If you have any questions or concerns before then, please call the clinic at [phone].  Please bring all your medications to every doctors visit  Sign up for My Chart to have easy access to your labs results, and communication with your Primary care physician.    Please check-out at the front desk before leaving the clinic.    Take Care,   Dr. Cyndia Skeeters

## 2016-10-23 NOTE — Progress Notes (Deleted)
   This encounter was created in error - please disregard.

## 2016-10-23 NOTE — Assessment & Plan Note (Signed)
Went over proper way of using her Flonase. Refilled her Flonase today.

## 2016-10-23 NOTE — Progress Notes (Signed)
Subjective:    Holly Mueller is a 51 y.o. old female here for "Asthma"  HPI "Asthma": reports dyspnea with ambulation and dry cough that has gotten worse over the last 2 weeks. She has no formal diagnosis for asthma but she is on Symbicort, albuterol and Singulair. She reports good compliance with his Symbicort. She has been using her albuterol every 4 hours for the last 2 weeks. She also reports runny nose due to allergy. She uses Flonase every day. She denies chest pain, fever, new orthopnea, PND, edema, calf pain or swelling. She has baseline 2 pillow orthopnea for more than a year. She has no history of CHF. She reports intermittent episodes of epistaxis. She also reports history of GERD. She is on Prilosec. She says this didn't help. Denies smoking cigarettes.  Off note, patient was seen by Dr. Daisy Mueller at [facility] pulmonology about 8 months ago. There was a concern about pulmonary fibrosis. She was advised to return for PFT. High-resolution CT was also ordered at that time but wasn't done. It appears patient didn't show up for the follow-up appointment in a month. She says her Medicaid expired at that time. She says her Medicaid is active now.  PMH/Problem List: has HYPOTHYROIDISM NOS; DEPRESSION; MIGRAINE HEADACHE; ASTHMA, EXTRINSIC NOS; DENTAL CARIES; SEIZURES, HX OF; Obesity; Allergic rhinitis; Elevated LFTs; Osteoarthritis; Healthcare maintenance; Umbilical hernia; Hip pain, bilateral; Ganglion cyst of wrist; Dyspnea; Lower esophageal ring; Bursitis of left hip; Abscess of skin of abdomen; Precordial chest pain; Chronic pain of left thumb; Cough; Epistaxis; and GERD (gastroesophageal reflux disease) on her problem list.   has a past medical history of Arthritis; Asthma (2004); Edentulous; Epilepsy (Holly Mueller) (in 1972); GERD (gastroesophageal reflux disease) (2008); Headache(784.0); Hypothyroidism (2008); Lower esophageal ring (08/18/2013); Seasonal allergies (2003); and Shortness of breath.  FH:  Family History   Problem Relation Age of Onset  . Hypertension Mother   . Diabetes Mother   . Lung cancer Father   . Stroke Father   . Diabetes Sister   . Hypertension Sister     [name] Social History  Substance Use Topics  . Smoking status: Never Smoker  . Smokeless tobacco: Never Used  . Alcohol use 0.0 oz/week     Comment: occ    Review of Systems Review of systems negative except for pertinent positives and negatives in history of present illness above.     Objective:     Vitals:   10/23/16 1105  BP: 116/70  Pulse: 82  Temp: 98.1 F (36.7 C)  TempSrc: Oral  SpO2: 96%  Weight: 260 lb (117.9 kg)    Physical Exam GEN: Morbidly obese, no apparent distress. Head: normocephalic and atraumatic  Eyes: conjunctiva without injection, sclera anicteric Ears: external ear and ear canal normal Nares: some rhinorrhea and erythema. No apparent bleeding.  Oropharynx: mmm without erythema or exudation HEM: negative for cervical or periauricular lymphadenopathies CVS: RRR, nl S1&S2, no murmurs, no edema RESP: speaks in full sentence, no IWOB,  fine crackles bilaterally, no wheeze. Went into coughing fit when asked to take a deep breath for exam.  GI: Morbidly obese, BS present & normal, soft SKIN: no apparent skin lesion NEURO: alert and oiented appropriately, no gross defecits  PSYCH: euthymic mood with congruent affect    Assessment and Plan:  Dyspnea History of asthma in her problem list. She is on asthma medication but never had PFTs in the past. Exam remarkable for fine crackles bilaterally today. No increased work of breathing or wheeze. Pulse ox  96% on room air. No signs of fluid overload to think of CHF. Dyspnea likely due to pulmonary fibrosis. She also have history of acid reflux which could be contributing to this. Obviously, her body habitus/morbid obesity has a role to play. -We'll try prednisone 50 mg daily for 5 days -Continue Symbicort, Singulair and albuterol. Refilled all  today -Changed her Prilosec to Protonix -Ordered ambulatory referral to her pulmonologist. Discussed about the importance of follow-up with the pulmonologist. She needed PFT and high-resolution CT. -Gave work and discussed return precautions  Epistaxis Patient with history of thrombocytopenia in the past. Unclear about the cause of her thrombocytopenia. Epistaxis likely due to improper Flonase use than thrombocytopenia. She has no skin bruises. Discussed about proper way of using her Flonase. Recommended saline nasal spray as needed. We will check CBC with differential today.  GERD (gastroesophageal reflux disease) Changing her Prilosec to Protonix  Allergic rhinitis Went over proper way of using her Flonase. Refilled her Flonase today.   Orders Placed This Encounter  Procedures  . CBC with Differential/Platelet  . Ambulatory referral to Pulmonology    Referral Priority:   Routine    Referral Type:   Consultation    Referral Reason:   Specialty Services Required    Requested Specialty:   Pulmonary Disease    Number of Visits Requested:   1    Return if symptoms worsen or fail to improve.  Mercy Riding, MD 10/23/16 Pager: [phone]

## 2016-10-23 NOTE — Assessment & Plan Note (Signed)
Patient with history of thrombocytopenia in the past. Unclear about the cause of her thrombocytopenia. Epistaxis likely due to improper Flonase use than thrombocytopenia. She has no skin bruises. Discussed about proper way of using her Flonase. Recommended saline nasal spray as needed. We will check CBC with differential today.

## 2016-10-23 NOTE — Assessment & Plan Note (Signed)
Changing her Prilosec to Protonix

## 2016-10-23 NOTE — Assessment & Plan Note (Signed)
History of asthma in her problem list. She is on asthma medication but never had PFTs in the past. Exam remarkable for fine crackles bilaterally today. No increased work of breathing or wheeze. Pulse ox 96% on room air. No signs of fluid overload to think of CHF. Dyspnea likely due to pulmonary fibrosis. She also have history of acid reflux which could be contributing to this. Obviously, her body habitus/morbid obesity has a role to play. -We'll try prednisone 50 mg daily for 5 days -Continue Symbicort, Singulair and albuterol. Refilled all today -Changed her Prilosec to Protonix -Ordered ambulatory referral to her pulmonologist. Discussed about the importance of follow-up with the pulmonologist. She needed PFT and high-resolution CT. -Gave work and discussed return precautions

## 2016-10-23 NOTE — Progress Notes (Deleted)
   Subjective:   Holly Mueller is a 51 y.o. female with a history of Migraines, asthma, allergic rhinitis, hypothyroidism, seizures, obesity here for same day appointment for No chief complaint on file.    ***  Review of Systems:  Per HPI.   Social History: *** smoker  Objective:  There were no vitals taken for this visit.  Gen:  51 y.o. female in NAD *** HEENT: NCAT, MMM, EOMI, PERRL, anicteric sclerae CV: RRR, no MRG, no JVD Resp: Non-labored, CTAB, no wheezes noted Abd: Soft, NTND, BS present, no guarding or organomegaly Ext: WWP, no edema MSK: Full ROM, strength intact Neuro: Alert and oriented, speech normal       Chemistry      Component Value Date/Time   NA 141 02/08/2014 1211   K 4.0 02/08/2014 1211   CL 108 02/08/2014 1211   CO2 21 02/08/2014 1211   BUN 11 02/08/2014 1211   CREATININE 0.67 02/08/2014 1211   CREATININE 0.65 08/26/2011 0957      Component Value Date/Time   CALCIUM 8.9 02/08/2014 1211   ALKPHOS 153 (H) 10/31/2012 1220   AST 81 (H) 10/31/2012 1220   ALT 81 (H) 10/31/2012 1220   BILITOT 0.5 10/31/2012 1220      Lab Results  Component Value Date   WBC 1.6 Repeated and verified X2. (LL) 03/06/2016   HGB 12.5 03/06/2016   HCT 35.8 (L) 03/06/2016   MCV 85.2 03/06/2016   PLT 78.0 (L) 03/06/2016   Lab Results  Component Value Date   TSH 16.153 (H) 02/12/2012   Lab Results  Component Value Date   HGBA1C 5.4 07/26/2008   Assessment & Plan:     Holly Mueller is a 52 y.o. female here for ***  No problem-specific Assessment & Plan notes found for this encounter.   Virginia Crews, MD MPH PGY-3,  [facility] 10/23/2016  11:04 AM

## 2016-10-24 ENCOUNTER — Other Ambulatory Visit: Payer: Self-pay | Admitting: Student

## 2016-10-24 ENCOUNTER — Encounter: Payer: Self-pay | Admitting: Student

## 2016-10-24 DIAGNOSIS — D7589 Other specified diseases of blood and blood-forming organs: Secondary | ICD-10-CM | POA: Insufficient documentation

## 2016-10-24 LAB — CBC WITH DIFFERENTIAL/PLATELET
Basophils Absolute: 0 10*3/uL (ref 0.0–0.2)
Basos: 2 %
EOS (ABSOLUTE): 0.1 10*3/uL (ref 0.0–0.4)
EOS: 5 %
Hematocrit: 37.4 % (ref 34.0–46.6)
Hemoglobin: 12.7 g/dL (ref 11.1–15.9)
IMMATURE GRANULOCYTES: 1 %
Immature Grans (Abs): 0 10*3/uL (ref 0.0–0.1)
Lymphocytes Absolute: 0.6 10*3/uL — ABNORMAL LOW (ref 0.7–3.1)
Lymphs: 35 %
MCH: 29.7 pg (ref 26.6–33.0)
MCHC: 34 g/dL (ref 31.5–35.7)
MCV: 88 fL (ref 79–97)
MONOS ABS: 0.1 10*3/uL (ref 0.1–0.9)
Monocytes: 7 %
NEUTROS PCT: 50 %
Neutrophils Absolute: 0.9 10*3/uL — ABNORMAL LOW (ref 1.4–7.0)
PLATELETS: 73 10*3/uL — AB (ref 150–379)
RBC: 4.27 x10E6/uL (ref 3.77–5.28)
RDW: 14.9 % (ref 12.3–15.4)
WBC: 1.7 10*3/uL — CL (ref 3.4–10.8)

## 2016-10-24 NOTE — Progress Notes (Signed)
CBC with bicytopenia. Discussed this with patient and ordered referral to hematologist. She says she has her appointment with pulmonologist set up in a week as well.

## 2016-10-24 NOTE — Progress Notes (Signed)
CBC result discussed with patient and mailed out to her. Referral to hematologist ordered.

## 2016-11-12 ENCOUNTER — Telehealth: Payer: Self-pay | Admitting: Hematology and Oncology

## 2016-11-12 ENCOUNTER — Encounter: Payer: Self-pay | Admitting: Hematology and Oncology

## 2016-11-12 NOTE — Telephone Encounter (Signed)
Appt has been scheduled for the pt to see Dr. Lebron Conners on 7/10 at 11am. Pt aware to arrive 30 minutes early. Demographics verified. Letter mailed.

## 2016-11-13 ENCOUNTER — Ambulatory Visit (INDEPENDENT_AMBULATORY_CARE_PROVIDER_SITE_OTHER): Payer: Medicaid Other | Admitting: Pulmonary Disease

## 2016-11-13 DIAGNOSIS — J8410 Pulmonary fibrosis, unspecified: Secondary | ICD-10-CM | POA: Diagnosis not present

## 2016-11-13 LAB — PULMONARY FUNCTION TEST
FEF 25-75 PRE: 2.16 L/s
FEF 25-75 Post: 1.7 L/sec
FEF2575-%CHANGE-POST: -21 %
FEF2575-%PRED-POST: 60 %
FEF2575-%PRED-PRE: 76 %
FEV1-%Change-Post: -1 %
FEV1-%PRED-POST: 51 %
FEV1-%Pred-Pre: 52 %
FEV1-PRE: 1.54 L
FEV1-Post: 1.52 L
FEV1FVC-%CHANGE-POST: -3 %
FEV1FVC-%PRED-PRE: 106 %
FEV6-%CHANGE-POST: 3 %
FEV6-%PRED-POST: 50 %
FEV6-%Pred-Pre: 48 %
FEV6-PRE: 1.78 L
FEV6-Post: 1.84 L
FEV6FVC-%PRED-PRE: 103 %
FEV6FVC-%Pred-Post: 103 %
FVC-%Change-Post: 2 %
FVC-%Pred-Post: 49 %
FVC-%Pred-Pre: 48 %
FVC-POST: 1.84 L
FVC-Pre: 1.8 L
POST FEV1/FVC RATIO: 82 %
PRE FEV1/FVC RATIO: 85 %
Post FEV6/FVC ratio: 100 %
Pre FEV6/FVC Ratio: 100 %

## 2016-11-13 NOTE — Progress Notes (Signed)
PFT done today.

## 2016-11-17 ENCOUNTER — Ambulatory Visit (HOSPITAL_BASED_OUTPATIENT_CLINIC_OR_DEPARTMENT_OTHER): Payer: Medicaid Other | Admitting: Hematology and Oncology

## 2016-11-17 ENCOUNTER — Telehealth: Payer: Self-pay | Admitting: Pulmonary Disease

## 2016-11-17 ENCOUNTER — Encounter: Payer: Self-pay | Admitting: Hematology and Oncology

## 2016-11-17 ENCOUNTER — Other Ambulatory Visit (HOSPITAL_COMMUNITY)
Admission: RE | Admit: 2016-11-17 | Discharge: 2016-11-17 | Disposition: A | Discharge status: Home / Self Care | Payer: Medicaid Other | Source: Ambulatory Visit | Attending: Hematology and Oncology | Admitting: Hematology and Oncology

## 2016-11-17 ENCOUNTER — Ambulatory Visit (HOSPITAL_BASED_OUTPATIENT_CLINIC_OR_DEPARTMENT_OTHER): Payer: Medicaid Other

## 2016-11-17 ENCOUNTER — Telehealth: Payer: Self-pay | Admitting: Hematology and Oncology

## 2016-11-17 VITALS — BP 136/85 | HR 83 | Temp 98.0°F | Resp 17 | Ht 65.0 in | Wt 254.8 lb

## 2016-11-17 DIAGNOSIS — D72819 Decreased white blood cell count, unspecified: Secondary | ICD-10-CM | POA: Diagnosis present

## 2016-11-17 DIAGNOSIS — D72818 Other decreased white blood cell count: Secondary | ICD-10-CM | POA: Diagnosis not present

## 2016-11-17 DIAGNOSIS — K7460 Unspecified cirrhosis of liver: Secondary | ICD-10-CM | POA: Diagnosis not present

## 2016-11-17 DIAGNOSIS — E039 Hypothyroidism, unspecified: Secondary | ICD-10-CM

## 2016-11-17 DIAGNOSIS — D696 Thrombocytopenia, unspecified: Secondary | ICD-10-CM

## 2016-11-17 DIAGNOSIS — Z87891 Personal history of nicotine dependence: Secondary | ICD-10-CM | POA: Diagnosis not present

## 2016-11-17 DIAGNOSIS — K743 Primary biliary cirrhosis: Secondary | ICD-10-CM | POA: Insufficient documentation

## 2016-11-17 LAB — CBC & DIFF AND RETIC
BASO%: 1.2 % (ref 0.0–2.0)
BASOS ABS: 0 10*3/uL (ref 0.0–0.1)
EOS%: 3.6 % (ref 0.0–7.0)
Eosinophils Absolute: 0.1 10*3/uL (ref 0.0–0.5)
HEMATOCRIT: 36.2 % (ref 34.8–46.6)
HEMOGLOBIN: 12.5 g/dL (ref 11.6–15.9)
IMMATURE RETIC FRACT: 3 % (ref 1.60–10.00)
LYMPH#: 0.6 10*3/uL — AB (ref 0.9–3.3)
LYMPH%: 33.5 % (ref 14.0–49.7)
MCH: 30 pg (ref 25.1–34.0)
MCHC: 34.5 g/dL (ref 31.5–36.0)
MCV: 86.8 fL (ref 79.5–101.0)
MONO#: 0.2 10*3/uL (ref 0.1–0.9)
MONO%: 10.8 % (ref 0.0–14.0)
NEUT#: 0.9 10*3/uL — ABNORMAL LOW (ref 1.5–6.5)
NEUT%: 50.9 % (ref 38.4–76.8)
PLATELETS: 86 10*3/uL — AB (ref 145–400)
RBC: 4.17 10*6/uL (ref 3.70–5.45)
RDW: 14.1 % (ref 11.2–14.5)
RETIC CT ABS: 165.97 10*3/uL — AB (ref 33.70–90.70)
Retic %: 3.98 % — ABNORMAL HIGH (ref 0.70–2.10)
WBC: 1.7 10*3/uL — ABNORMAL LOW (ref 3.9–10.3)

## 2016-11-17 LAB — COMPREHENSIVE METABOLIC PANEL
ALT: 46 U/L (ref 0–55)
ANION GAP: 7 meq/L (ref 3–11)
AST: 61 U/L — ABNORMAL HIGH (ref 5–34)
Albumin: 3.3 g/dL — ABNORMAL LOW (ref 3.5–5.0)
Alkaline Phosphatase: 148 U/L (ref 40–150)
BUN: 7.2 mg/dL (ref 7.0–26.0)
CALCIUM: 9.3 mg/dL (ref 8.4–10.4)
CHLORIDE: 106 meq/L (ref 98–109)
CO2: 24 meq/L (ref 22–29)
Creatinine: 0.8 mg/dL (ref 0.6–1.1)
EGFR: 84 mL/min/{1.73_m2} — ABNORMAL LOW (ref >90)
Glucose: 216 mg/dl — ABNORMAL HIGH (ref 70–140)
POTASSIUM: 4.2 meq/L (ref 3.5–5.1)
Sodium: 137 mEq/L (ref 136–145)
Total Bilirubin: 2.38 mg/dL — ABNORMAL HIGH (ref 0.20–1.20)
Total Protein: 8.4 g/dL — ABNORMAL HIGH (ref 6.4–8.3)

## 2016-11-17 LAB — MORPHOLOGY: PLT EST: DECREASED

## 2016-11-17 LAB — LACTATE DEHYDROGENASE: LDH: 258 U/L — AB (ref 125–245)

## 2016-11-17 LAB — TSH: TSH: 0.081 m[IU]/L — AB (ref 0.308–3.960)

## 2016-11-17 NOTE — Assessment & Plan Note (Signed)
Initially suspected in February 2014 during laparoscopic hernia repair. Biopsy obtained at that time was consistent with cirrhosis, but I do not see any specific follow-up arranged for the problems since then or additional evaluation of the patient. No recent liver function tests to assess the state of the hepatic function.  Patient is not the most informative historian, but based on the current report, I do not think this is alcohol or recreational substance induced. Additional differential includes autoimmune hepatitis leading to cirrhosis, versus viral hepatic cirrhosis.  Plan: --Labs today --RtUQ [place] --Referral to gastroenterology for additional evaluation and management of unexplained liver cirrhosis.

## 2016-11-17 NOTE — Assessment & Plan Note (Signed)
51 year old female referred to our clinic for evaluation of bicytopenia with leukopenia and thrombocytopenia.  Review of the trends of the white blood cell counts reveal neutropenia and lymphopenia which have developed gradually since 2014. Process is chronic and reasonably stable with no significant change in the blood counts from October 2017 to in June 2018. No intercurrent basophilia or monocytosis at this time. No evidence of immature forms presence historically. Neutropenia at this time is moderate. Has no evidence of recurrent infections history and reports.  Concurrent thrombocytopenia described separately although likely representing the same process.   Differential diagnosis is broad and may include primary bone marrow failure such as myelodysplasia, leukemia, or effects of an autoimmune condition decreasing proliferative capacity of the bone marrow. Other possible etiologies include hepatic dysfunction considering patient suffers from hepatic cirrhosis nutritional deficiencies such as copper deficiency and other potential problems such as viral infections with hepatitis C, EBV or CMV.  Due to presence of significant hematological abnormalities in 2 branches of hematopoiesis, I believe bone marrow biopsy is appropriate. Due to patient's habitus, my preference is for interventional radiology to conduct the procedure.  Plan: --Consult IR for BM Bx --Labs as outlined below

## 2016-11-17 NOTE — Progress Notes (Signed)
[facility] New Visit:  Assessment: Chronic leukopenia 51 year old female referred to our clinic for evaluation of bicytopenia with leukopenia and thrombocytopenia.  Review of the trends of the white blood cell counts reveal neutropenia and lymphopenia which have developed gradually since 2014. Process is chronic and reasonably stable with no significant change in the blood counts from October 2017 to in June 2018. No intercurrent basophilia or monocytosis at this time. No evidence of immature forms presence historically. Neutropenia at this time is moderate. Has no evidence of recurrent infections history and reports.  Concurrent thrombocytopenia described separately although likely representing the same process.   Differential diagnosis is broad and may include primary bone marrow failure such as myelodysplasia, leukemia, or effects of an autoimmune condition decreasing proliferative capacity of the bone marrow. Other possible etiologies include hepatic dysfunction considering patient suffers from hepatic cirrhosis nutritional deficiencies such as copper deficiency and other potential problems such as viral infections with hepatitis C, EBV or CMV.  Due to presence of significant hematological abnormalities in 2 branches of hematopoiesis, I believe bone marrow biopsy is appropriate. Due to patient's habitus, my preference is for interventional radiology to conduct the procedure.  Plan: --Consult IR for BM Bx --Labs as outlined below     Thrombocytopenia (HCC) Moderate thrombocytopenia stable for the past 8 months, but progressive when compared to a year ago. Etiology is not entirely clear. Please see differential submitted for leukopenia. Most likely etiology at this time is development of cirrhosis leading to portal hypertension and potential splenomegaly.  Plan:  --Labs as below --RtUQ [place] & spleen  Cirrhosis of liver without ascites ([name]) Initially suspected in  February 2014 during laparoscopic hernia repair. Biopsy obtained at that time was consistent with cirrhosis, but I do not see any specific follow-up arranged for the problems since then or additional evaluation of the patient. No recent liver function tests to assess the state of the hepatic function.  Patient is not the most informative historian, but based on the current report, I do not think this is alcohol or recreational substance induced. Additional differential includes autoimmune hepatitis leading to cirrhosis, versus viral hepatic cirrhosis.  Plan: --Labs today --RtUQ [place] --Referral to gastroenterology for additional evaluation and management of unexplained liver cirrhosis.  RTC 1-2 weeks after BM BX  Orders Placed This Encounter  Procedures  . US Abdomen Limited    Standing Status:   Future    Standing Expiration Date:   11/17/2017    Order Specific Question:   Reason for Exam (SYMPTOM  OR DIAGNOSIS REQUIRED)    Answer:   Bicytopenia, prev hepatic Bx with report of possible cirrhosis. Please eval for sonographic cirrhosis evidence and for spleen size    Order Specific Question:   Preferred imaging location?    Answer:   [facility]  . CBC & Diff and Retic    Standing Status:   Future    Number of Occurrences:   1    Standing Expiration Date:   11/17/2017  . Morphology    Standing Status:   Future    Number of Occurrences:   1    Standing Expiration Date:   11/17/2017  . Sedimentation rate    Standing Status:   Future    Number of Occurrences:   1    Standing Expiration Date:   11/17/2017  . Comprehensive metabolic panel    Standing Status:   Future    Number of Occurrences:   1  Standing Expiration Date:   11/17/2017  . Lactate dehydrogenase (LDH)    Standing Status:   Future    Number of Occurrences:   1    Standing Expiration Date:   11/17/2017  . Flow Cytometry    Standing Status:   Future    Number of Occurrences:   1    Standing Expiration Date:    11/17/2017  . T Cell Lymphoma, PCR    Standing Status:   Future    Number of Occurrences:   1    Standing Expiration Date:   11/17/2017  . Copper, serum    Standing Status:   Future    Number of Occurrences:   1    Standing Expiration Date:   11/17/2017  . Heavy metals, blood    Standing Status:   Future    Number of Occurrences:   1    Standing Expiration Date:   11/17/2017    Order Specific Question:   [place] of residence?    Answer:   [place] [[id number]]  . SPEP (Serum protein electrophoresis)    Standing Status:   Future    Number of Occurrences:   1    Standing Expiration Date:   11/17/2017  . ANA, IFA (with reflex)    Standing Status:   Future    Number of Occurrences:   1    Standing Expiration Date:   11/17/2017  . Rheumatoid factor    Standing Status:   Future    Number of Occurrences:   1    Standing Expiration Date:   11/17/2017  . ANCA Tit[place]    Standing Status:   Future    Number of Occurrences:   1    Standing Expiration Date:   11/17/2017  . Epstein-Barr virus VCA, IgG    Standing Status:   Future    Number of Occurrences:   1    Standing Expiration Date:   11/17/2017  . Epstein-Barr virus VCA, IgM    Standing Status:   Future    Number of Occurrences:   1    Standing Expiration Date:   11/17/2017  . Epstein-Barr virus early D antigen antibody, IgG    Standing Status:   Future    Number of Occurrences:   1    Standing Expiration Date:   11/17/2017  . Epstein-Barr virus nuclear antigen antibody, IgG    Standing Status:   Future    Number of Occurrences:   1    Standing Expiration Date:   11/17/2017  . CMV IgM    Standing Status:   Future    Number of Occurrences:   1    Standing Expiration Date:   11/17/2017  . CMV antibody, IgG (EIA)    Standing Status:   Future    Number of Occurrences:   1    Standing Expiration Date:   11/17/2017  . Hepatitis B surface antibody    Standing Status:   Future    Number of Occurrences:   1    Standing Expiration Date:   11/17/2017    . Hepatitis B surface antigen    Standing Status:   Future    Number of Occurrences:   1    Standing Expiration Date:   11/17/2017  . Hepatitis B core antibody, IgM    Standing Status:   Future    Number of Occurrences:   1    Standing Expiration Date:   11/17/2017  .  Hepatitis C antibody (reflex if positive)    Standing Status:   Future    Number of Occurrences:   1    Standing Expiration Date:   11/17/2017  . HIV antibody (with reflex)    Standing Status:   Future    Number of Occurrences:   1    Standing Expiration Date:   11/17/2017  . Human parvovirus DNA detection by PCR    Standing Status:   Future    Number of Occurrences:   1    Standing Expiration Date:   11/17/2017  . C-reactive protein    Standing Status:   Future    Number of Occurrences:   1    Standing Expiration Date:   11/17/2017  . TSH    Standing Status:   Future    Number of Occurrences:   1    Standing Expiration Date:   11/17/2017  . C3 and C4    Standing Status:   Future    Number of Occurrences:   1    Standing Expiration Date:   11/17/2017  . Ambulatory referral to Interventional Radiology    Referral Priority:   Routine    Referral Type:   Consultation    Referral Reason:   Specialty Services Required    Requested Specialty:   Interventional Radiology    Number of Visits Requested:   1    All questions were answered.  . The patient knows to call the clinic with any problems, questions or concerns.  This note was electronically signed.    History of Presenting Illness Holly Mueller 51 y.o. presenting to the [facility] for evaluation of bicytopenia.See hematological history below for the details of labwork todate. The present time, patient mainly complains of shortness of breath that she attributes to asthma diagnosis. She also complains of low energy. Patient denies recurrent infections, denies fevers, chills, night sweats. No unexpected weight change. She denies any significant changes to her  appetite or activity tolerance changes that are not related to shortness of breath. She denies nausea, vomiting, abdominal pain, diarrhea, or constipation. Denies dysuria or hematuria. Denies any swelling in the neck, armpits, or groin. Denies any skin rashes or easy bruisability.  Patient denies any history of alcohol abuse. She does not drink at the present time at all, and previously only has been occasional and social drinker. She denies any history of IV drug use, mushroom abuse, or spider bites. Denies previous exposure to significant amounts of solvents or other chemicals other than the usual use of cleaning supplies at work and at home.  Additional review of the history reveals hospitalization in February 2014. During that time, and was diagnosed with an incarcerated umbilical hernia and incarcerated ventral hernia and she underwent surgical repair for both situations with liver biopsies obtained demonstrating hepatic cirrhosis at that time.   Oncological/hematological History: --Liver Bx, 06/30/12: Fibrosis with chronic and active inflammation consistent with cirrhosis. --Labs, 04/25/13: WBC 4.2, ANC ..., ALC ..., Hgb 13.2, MCV 85.9, MCH 30.1, Plt 164; --Labs, 07/13/13: WBC 3.2, ANC ..., ALC ..., Hgb 12.1, MCV 86.1, MCH 30.0, Plt 115; --EGD, Jun 2015: Positive for Schatzki's ring, his esophagitis, rhinitis, negative for H. pylori, biopsy is negative for eosinophilic infiltrates. --Labs, 02/08/14: WBC 2.1, ANC ..., ALC ..., Hgb 11.7, MCV 86.2, MCH 29.4, Plt 105; --Labs, 03/06/16: WBC 1.6, ANC 0.8, ALC 0.6, Baso 1.7%, Hgb 12.5, MCV 85.2, MCH ..., Plt 78 --Labs, 10/23/16: WBC 1.7, ANC 0.9, ALC  0.6, Baso 2.0%, Hgb 12.7, MCV 88.0, MCH 29.7, Plt 73;   Medical History: Past Medical History:  Diagnosis Date  . Arthritis    bursitis left hip flares-not an issue  . Asthma 2004  . Edentulous    10-19-13 at present  . Epilepsy ([place]) in 1972   No seizures since 1972. Previously treated with  phenobarbital.   . GERD (gastroesophageal reflux disease) 2008  . Headache(784.0)    hx of migraines   . Hypothyroidism 2008  . Lower esophageal ring 08/18/2013  . Seasonal allergies 2003  . Shortness of breath     Surgical History: Past Surgical History:  Procedure Laterality Date  . BALLOON DILATION N/A 10/24/2013   Procedure: BALLOON DILATION;  Surgeon: Gatha Mayer, MD;  Location: [facility];  Service: Endoscopy;  Laterality: N/A;  . CESAREAN SECTION     x2  . DENTAL SURGERY     multiple extractions 3'15  . ESOPHAGOGASTRODUODENOSCOPY N/A 10/24/2013   Procedure: ESOPHAGOGASTRODUODENOSCOPY (EGD);  Surgeon: Gatha Mayer, MD;  Location: [facility];  Service: Endoscopy;  Laterality: N/A;  . LIVER BIOPSY  06/30/2012   Procedure: LIVER BIOPSY;  Surgeon: Shann Medal, MD;  Location: [facility] ORS;  Service: General;;  . NOVASURE ABLATION    . SHOULDER ARTHROSCOPY Right 2011  . UMBILICAL HERNIA REPAIR N/A 06/30/2012   Procedure: remove umbilicus;  Surgeon: Shann Medal, MD;  Location: [facility];  Service: General;  Laterality: N/A;  . VENTRAL HERNIA REPAIR N/A 06/30/2012   Procedure: LAPAROSCOPIC VENTRAL HERNIA;  Surgeon: Shann Medal, MD;  Location: [facility];  Service: General;  Laterality: N/A;  With Mesh  . WISDOM TOOTH EXTRACTION      Family History: Family History  Problem Relation Age of Onset  . Hypertension Mother   . Diabetes Mother   . Lung cancer Father   . Stroke Father   . Diabetes Sister   . Hypertension Sister     Social History: Social History   Social History  . Marital status: Single    Spouse name: N/A  . Number of children: 2  . Years of education: N/A   Occupational History  .  [employer]   Social History Main Topics  . Smoking status: Former Smoker    Packs/day: 0.25    Types: Cigarettes    Quit date: 15  . Smokeless tobacco: Never Used  . Alcohol use 0.0 oz/week     Comment: occ  . Drug use: No  . Sexual activity: No   Other Topics  Concern  . Not on file   Social History Narrative   Works in Estate manager/land agent at [employer].    Lives with 12 and 14 (boys).    Also living with close friends of the family (mother and two kids, adults).           Allergies: Allergies  Allergen Reactions  . Aspirin Nausea Only    Upset stomach    Medications:  Current Outpatient Prescriptions  Medication Sig Dispense Refill  . albuterol (PROAIR HFA) 108 (90 Base) MCG/ACT inhaler INHALE 2 PUFFS INTO THE LUNGS EVERY 6 HOURS AS NEEDED FOR SHORTNESS OF BREATH 8.5 g 5  . albuterol (PROVENTIL) (2.5 MG/3ML) 0.083% nebulizer solution Take 3 mLs (2.5 mg total) by nebulization every 6 (six) hours as needed for wheezing or shortness of breath. 75 mL 2  . benzonatate (TESSALON) 100 MG capsule Take 1 capsule (100 mg total) by mouth every 8 (eight) hours.  30 capsule 0  . budesonide-formoterol (SYMBICORT) 160-4.5 MCG/ACT inhaler Inhale 2 puffs into the lungs 2 (two) times daily. 1 Inhaler 2  . fluticasone (FLONASE) 50 MCG/ACT nasal spray Place 2 sprays into both nostrils daily.    Marland Kitchen guaiFENesin-codeine (ROBITUSSIN AC) 100-10 MG/5ML syrup Take 5 mLs by mouth 3 (three) times daily as needed for cough. 120 mL 0  . levothyroxine (SYNTHROID, LEVOTHROID) 300 MCG tablet Take 1 tablet (300 mcg total) by mouth daily. 90 tablet 3  . pantoprazole (PROTONIX) 40 MG tablet Take 1 tablet (40 mg total) by mouth daily. 30 tablet 3  . Pseudoephedrine-APAP-DM (DAYQUIL MULTI-SYMPTOM COLD/FLU PO) Take 1 capsule by mouth 2 (two) times daily as needed (sinus pain).     No current facility-administered medications for this visit.     Review of Systems: Review of Systems  All other systems reviewed and are negative.    PHYSICAL EXAMINATION Blood pressure 136/85, pulse 83, temperature 98 F (36.7 C), temperature source Oral, resp. rate 17, height _0  (1.651 m), weight 254 lb 12.8 oz (115.6 kg), SpO2 96 %.  ECOG PERFORMANCE STATUS: 1 - Symptomatic but  completely ambulatory  Physical Exam  Constitutional: She is oriented to person, place, and time and well-developed, well-nourished, and in no distress. Vital signs are normal. She appears not lethargic, not malnourished and not jaundiced. She appears not cachectic.  Non-toxic appearance. She does not have a sickly appearance. No distress.  Morbidly obese female  HENT:  Head: Normocephalic and atraumatic.  Mouth/Throat: She does not have dentures. Normal dentition. No oropharyngeal exudate.    Eyes: Conjunctivae and EOM are normal. Pupils are equal, round, and reactive to light. Right eye exhibits no chemosis and no exudate. Left eye exhibits no chemosis and no exudate. Right conjunctiva has no hemorrhage. Left conjunctiva has no hemorrhage. No scleral icterus.  Neck: No JVD present. No thyroid mass and no thyromegaly present.  Cardiovascular: Normal rate, regular rhythm, S1 normal and S2 normal.  Exam reveals no gallop.   No murmur heard. Pulmonary/Chest: Effort normal. She has no decreased breath sounds. She has no wheezes.  Coarse breath sounds diffusely with bilateral lungs. No crackles.  Abdominal: Soft. Normal appearance. There is no tenderness. There is no CVA tenderness.  Extensive pannus precluding accurate examination of the liver and spleen. No hepatosplenomegaly by percussion.  Lymphadenopathy:       Head (right side): No submandibular and no occipital adenopathy present.       Head (left side): No submandibular and no occipital adenopathy present.    She has no cervical adenopathy.    She has no axillary adenopathy.       Right: No inguinal and no supraclavicular adenopathy present.       Left: No inguinal and no supraclavicular adenopathy present.  Neurological: She is oriented to person, place, and time. She appears not lethargic.  Nonfocal neurological examination with apparent preserved and symmetric strength and sensation.  Skin: She is not diaphoretic.  No petechiae,  ecchymosis, or rash. No jaundice.     LABORATORY DATA: I have personally reviewed the data as listed: Appointment on 11/17/2016  Component Date Value Ref Range Status  . WBC 11/17/2016 1.7* 3.9 - 10.3 10e3/uL Final  . NEUT# 11/17/2016 0.9* 1.5 - 6.5 10e3/uL Final  . HGB 11/17/2016 12.5  11.6 - 15.9 g/dL Final  . HCT 11/17/2016 36.2  34.8 - 46.6 % Final  . Platelets 11/17/2016 86* 145 - 400 10e3/uL Final  . MCV  11/17/2016 86.8  79.5 - 101.0 fL Final  . MCH 11/17/2016 30.0  25.1 - 34.0 pg Final  . MCHC 11/17/2016 34.5  31.5 - 36.0 g/dL Final  . RBC 11/17/2016 4.17  3.70 - 5.45 10e6/uL Final  . RDW 11/17/2016 14.1  11.2 - 14.5 % Final  . lymph# 11/17/2016 0.6* 0.9 - 3.3 10e3/uL Final  . MONO# 11/17/2016 0.2  0.1 - 0.9 10e3/uL Final  . Eosinophils Absolute 11/17/2016 0.1  0.0 - 0.5 10e3/uL Final  . Basophils Absolute 11/17/2016 0.0  0.0 - 0.1 10e3/uL Final  . NEUT% 11/17/2016 50.9  38.4 - 76.8 % Final  . LYMPH% 11/17/2016 33.5  14.0 - 49.7 % Final  . MONO% 11/17/2016 10.8  0.0 - 14.0 % Final  . EOS% 11/17/2016 3.6  0.0 - 7.0 % Final  . BASO% 11/17/2016 1.2  0.0 - 2.0 % Final  . Retic % 11/17/2016 3.98* 0.70 - 2.10 % Final  . Retic Ct Abs 11/17/2016 165.97* 33.70 - 90.70 10e3/uL Final  . Immature Retic Fract 11/17/2016 3.00  1.60 - 10.00 % Final  . Polychromasia 11/17/2016 Slight  Slight Final  . Ovalocytes 11/17/2016 Few  Negative Final  . White Cell Comments 11/17/2016 C/W auto diff   Final  . PLT EST 11/17/2016 Decreased  Adequate Final  . Sodium 11/17/2016 137  136 - 145 mEq/L Final  . Potassium 11/17/2016 4.2  3.5 - 5.1 mEq/L Final  . Chloride 11/17/2016 106  98 - 109 mEq/L Final  . CO2 11/17/2016 24  22 - 29 mEq/L Final  . Glucose 11/17/2016 216* 70 - 140 mg/dl Final   Glucose reference range is for nonfasting patients. Fasting glucose reference range is 70- 100.  Marland Kitchen BUN 11/17/2016 7.2  7.0 - 26.0 mg/dL Final  . Creatinine 11/17/2016 0.8  0.6 - 1.1 mg/dL Final  . Total  Bilirubin 11/17/2016 2.38* 0.20 - 1.20 mg/dL Final  . Alkaline Phosphatase 11/17/2016 148  40 - 150 U/L Final  . AST 11/17/2016 61* 5 - 34 U/L Final  . ALT 11/17/2016 46  0 - 55 U/L Final  . Total Protein 11/17/2016 8.4* 6.4 - 8.3 g/dL Final  . Albumin 11/17/2016 3.3* 3.5 - 5.0 g/dL Final  . Calcium 11/17/2016 9.3  8.4 - 10.4 mg/dL Final  . Anion Gap 11/17/2016 7  3 - 11 mEq/L Final  . EGFR 11/17/2016 84* >90 ml/min/1.73 m2 Final   eGFR is calculated using the CKD-EPI Creatinine Equation (2009)  . LDH 11/17/2016 258* 125 - 245 U/L Final  . TSH 11/17/2016 0.081* 0.308 - 3.960 m(IU)/L Final  Clinical Support on 11/13/2016  Component Date Value Ref Range Status  . FVC-Pre 11/13/2016 1.80  L Preliminary  . FVC-%Pred-Pre 11/13/2016 48  % Preliminary  . FVC-Post 11/13/2016 1.84  L Preliminary  . FVC-%Pred-Post 11/13/2016 49  % Preliminary  . FVC-%Change-Post 11/13/2016 2  % Preliminary  . FEV1-Pre 11/13/2016 1.54  L Preliminary  . FEV1-%Pred-Pre 11/13/2016 52  % Preliminary  . FEV1-Post 11/13/2016 1.52  L Preliminary  . FEV1-%Pred-Post 11/13/2016 51  % Preliminary  . FEV1-%Change-Post 11/13/2016 -1  % Preliminary  . FEV6-Pre 11/13/2016 1.78  L Preliminary  . FEV6-%Pred-Pre 11/13/2016 48  % Preliminary  . FEV6-Post 11/13/2016 1.84  L Preliminary  . FEV6-%Pred-Post 11/13/2016 50  % Preliminary  . FEV6-%Change-Post 11/13/2016 3  % Preliminary  . Pre FEV1/FVC ratio 11/13/2016 85  % Preliminary  . FEV1FVC-%Pred-Pre 11/13/2016 106  % Preliminary  . Post FEV1/FVC  ratio 11/13/2016 82  % Preliminary  . FEV1FVC-%Change-Post 11/13/2016 -3  % Preliminary  . Pre FEV6/FVC Ratio 11/13/2016 100  % Preliminary  . FEV6FVC-%Pred-Pre 11/13/2016 103  % Preliminary  . Post FEV6/FVC ratio 11/13/2016 100  % Preliminary  . FEV6FVC-%Pred-Post 11/13/2016 103  % Preliminary  . FEF 25-75 Pre 11/13/2016 2.16  L/sec Preliminary  . FEF2575-%Pred-Pre 11/13/2016 76  % Preliminary  . FEF 25-75 Post 11/13/2016 1.70   L/sec Preliminary  . FEF2575-%Pred-Post 11/13/2016 60  % Preliminary  . FEF2575-%Change-Post 11/13/2016 -21  % Preliminary   CBC      Ardath Sax, MD

## 2016-11-17 NOTE — Telephone Encounter (Signed)
Gave patient avs report and appointments for August. [facility] radiology will call re scan/bx.

## 2016-11-17 NOTE — Telephone Encounter (Signed)
Spoke with patient-aware that PM is back on Monday 11/23/16-once PFT results have been signed off on then we can call patient with results and suggestions.

## 2016-11-17 NOTE — Assessment & Plan Note (Signed)
Moderate thrombocytopenia stable for the past 8 months, but progressive when compared to a year ago. Etiology is not entirely clear. Please see differential submitted for leukopenia. Most likely etiology at this time is development of cirrhosis leading to portal hypertension and potential splenomegaly.  Plan:  --Labs as below --RtUQ [place] & spleen

## 2016-11-18 ENCOUNTER — Other Ambulatory Visit: Payer: Self-pay | Admitting: Hematology and Oncology

## 2016-11-18 DIAGNOSIS — D759 Disease of blood and blood-forming organs, unspecified: Secondary | ICD-10-CM

## 2016-11-18 LAB — C-REACTIVE PROTEIN: CRP: 7.2 mg/L — AB (ref 0.0–4.9)

## 2016-11-18 LAB — C3 AND C4
COMPLEMENT C3, SERUM: 76 mg/dL — AB (ref 82–167)
Complement C4, Serum: 12 mg/dL — ABNORMAL LOW (ref 14–44)

## 2016-11-18 LAB — HEPATITIS B SURFACE ANTIGEN: HBsAg Screen: NEGATIVE

## 2016-11-18 LAB — HEPATITIS B CORE ANTIBODY, IGM: Hep B Core Ab, IgM: NEGATIVE

## 2016-11-18 LAB — HEAVY METALS, BLOOD
Arsenic, Blood: 9 ug/L (ref 2–23)
Lead, Blood: NOT DETECTED ug/dL (ref 0–19)
MERCURY: NOT DETECTED ug/L (ref 0.0–14.9)

## 2016-11-18 LAB — HEPATITIS B SURFACE ANTIBODY,QUALITATIVE: Hep B Surface Ab, Qual: NONREACTIVE

## 2016-11-18 LAB — CMV IGM: CMV IgM Ser EIA-aCnc: <30 AU/mL (ref 0.0–29.9)

## 2016-11-18 LAB — EPSTEIN-BARR VIRUS VCA, IGG

## 2016-11-18 LAB — EPSTEIN-BARR VIRUS VCA, IGM

## 2016-11-18 LAB — RHEUMATOID FACTOR

## 2016-11-18 LAB — HEPATITIS C ANTIBODY (REFLEX): HCV AB: 0.2 {s_co_ratio} (ref 0.0–0.9)

## 2016-11-18 LAB — CMV ANTIBODY, IGG (EIA): CMV Ab - IgG: >10 U/mL — ABNORMAL HIGH (ref 0.00–0.59)

## 2016-11-18 LAB — EPSTEIN-BARR VIRUS NUCLEAR ANTIGEN ANTIBODY, IGG: EBV NA IgG: >600 U/mL — ABNORMAL HIGH (ref 0.0–17.9)

## 2016-11-18 LAB — EPSTEIN-BARR VIRUS EARLY D ANTIGEN ANTIBODY, IGG: EBV EARLY ANTIGEN AB, IGG: 13.7 U/mL — AB (ref 0.0–8.9)

## 2016-11-18 LAB — SEDIMENTATION RATE: SED RATE: 18 mm/h (ref 0–40)

## 2016-11-18 LAB — HIV ANTIBODY (ROUTINE TESTING W REFLEX): HIV Screen 4th Generation wRfx: NONREACTIVE

## 2016-11-18 NOTE — Addendum Note (Signed)
Addended by: Ardath Sax on: 11/18/2016 02:29 PM   Modules accepted: Orders

## 2016-11-19 LAB — PROTEIN ELECTROPHORESIS, SERUM
A/G Ratio: 0.8 (ref 0.7–1.7)
ALBUMIN: 3.4 g/dL (ref 2.9–4.4)
ALPHA 1: 0.3 g/dL (ref 0.0–0.4)
ALPHA 2: 0.5 g/dL (ref 0.4–1.0)
Beta: 0.9 g/dL (ref 0.7–1.3)
Gamma Globulin: 2.8 g/dL — ABNORMAL HIGH (ref 0.4–1.8)
Globulin, Total: 4.5 g/dL — ABNORMAL HIGH (ref 2.2–3.9)
Total Protein: 7.9 g/dL (ref 6.0–8.5)

## 2016-11-19 LAB — COPPER, SERUM: Copper: 122 ug/dL (ref 72–166)

## 2016-11-19 LAB — ANTINUCLEAR ANTIBODIES, IFA: ANA Titer 1: NEGATIVE

## 2016-11-19 LAB — FLOW CYTOMETRY

## 2016-11-19 LAB — HUMAN PARVOVIRUS DNA DETECTION BY PCR: PARVOVIRUS B19 PCR: NEGATIVE

## 2016-11-19 LAB — ANCA TITERS: Perinuclear (P-ANCA): <1:20 {titer}

## 2016-11-20 NOTE — Telephone Encounter (Signed)
PM please advise of PFT results for the pt. Thanks

## 2016-11-23 ENCOUNTER — Emergency Department (HOSPITAL_COMMUNITY): Payer: Medicaid Other

## 2016-11-23 ENCOUNTER — Encounter (HOSPITAL_COMMUNITY): Payer: Self-pay | Admitting: *Deleted

## 2016-11-23 ENCOUNTER — Emergency Department (HOSPITAL_COMMUNITY)
Admission: EM | Admit: 2016-11-23 | Discharge: 2016-11-23 | Disposition: A | Discharge status: Home / Self Care | Payer: Medicaid Other | Attending: Emergency Medicine | Admitting: Emergency Medicine

## 2016-11-23 DIAGNOSIS — E039 Hypothyroidism, unspecified: Secondary | ICD-10-CM | POA: Diagnosis not present

## 2016-11-23 DIAGNOSIS — Z87891 Personal history of nicotine dependence: Secondary | ICD-10-CM | POA: Diagnosis not present

## 2016-11-23 DIAGNOSIS — J45901 Unspecified asthma with (acute) exacerbation: Secondary | ICD-10-CM | POA: Diagnosis not present

## 2016-11-23 DIAGNOSIS — R05 Cough: Secondary | ICD-10-CM

## 2016-11-23 DIAGNOSIS — Z79899 Other long term (current) drug therapy: Secondary | ICD-10-CM | POA: Insufficient documentation

## 2016-11-23 DIAGNOSIS — R059 Cough, unspecified: Secondary | ICD-10-CM

## 2016-11-23 DIAGNOSIS — R0602 Shortness of breath: Secondary | ICD-10-CM | POA: Diagnosis present

## 2016-11-23 LAB — CBC WITH DIFFERENTIAL/PLATELET
BASOS ABS: 0 10*3/uL (ref 0.0–0.1)
BASOS PCT: 1 %
EOS PCT: 4 %
Eosinophils Absolute: 0.1 10*3/uL (ref 0.0–0.7)
HEMATOCRIT: 33.5 % — AB (ref 36.0–46.0)
Hemoglobin: 11.4 g/dL — ABNORMAL LOW (ref 12.0–15.0)
Lymphocytes Relative: 33 %
Lymphs Abs: 0.6 10*3/uL — ABNORMAL LOW (ref 0.7–4.0)
MCH: 28.8 pg (ref 26.0–34.0)
MCHC: 34 g/dL (ref 30.0–36.0)
MCV: 84.6 fL (ref 78.0–100.0)
MONO ABS: 0.2 10*3/uL (ref 0.1–1.0)
MONOS PCT: 10 %
Neutro Abs: 1 10*3/uL — ABNORMAL LOW (ref 1.7–7.7)
Neutrophils Relative %: 52 %
PLATELETS: 79 10*3/uL — AB (ref 150–400)
RBC: 3.96 MIL/uL (ref 3.87–5.11)
RDW: 14.1 % (ref 11.5–15.5)
WBC: 1.9 10*3/uL — ABNORMAL LOW (ref 4.0–10.5)

## 2016-11-23 LAB — BASIC METABOLIC PANEL
ANION GAP: 5 (ref 5–15)
BUN: <5 mg/dL — ABNORMAL LOW (ref 6–20)
CALCIUM: 8.9 mg/dL (ref 8.9–10.3)
CO2: 23 mmol/L (ref 22–32)
CREATININE: 0.65 mg/dL (ref 0.44–1.00)
Chloride: 108 mmol/L (ref 101–111)
Glucose, Bld: 150 mg/dL — ABNORMAL HIGH (ref 65–99)
Potassium: 3.7 mmol/L (ref 3.5–5.1)
Sodium: 136 mmol/L (ref 135–145)

## 2016-11-23 LAB — TROPONIN I: Troponin I: <0.03 ng/mL (ref <0.03)

## 2016-11-23 LAB — BRAIN NATRIURETIC PEPTIDE: B NATRIURETIC PEPTIDE 5: 50.5 pg/mL (ref 0.0–100.0)

## 2016-11-23 LAB — D-DIMER, QUANTITATIVE (NOT AT ARMC): D DIMER QUANT: 0.89 ug{FEU}/mL — AB (ref 0.00–0.50)

## 2016-11-23 MED ORDER — BENZONATATE 100 MG PO CAPS: 100.0000 mg | ORAL_CAPSULE | Freq: Two times a day (BID) | ORAL | 0 refills | Status: DC | PRN

## 2016-11-23 MED ORDER — PREDNISONE 20 MG PO TABS: ORAL_TABLET | ORAL | 0 refills | Status: DC

## 2016-11-23 MED ORDER — ACETAMINOPHEN 500 MG PO TABS
1000.0000 mg | ORAL_TABLET | Freq: Once | ORAL | Status: AC
  Administered 2016-11-23: 1000 mg via ORAL
  Filled 2016-11-23: qty 2

## 2016-11-23 MED ORDER — IPRATROPIUM-ALBUTEROL 0.5-2.5 (3) MG/3ML IN SOLN
3.0000 mL | Freq: Once | RESPIRATORY_TRACT | Status: AC
  Administered 2016-11-23: 3 mL via RESPIRATORY_TRACT
  Filled 2016-11-23: qty 3

## 2016-11-23 MED ORDER — IOPAMIDOL (ISOVUE-370) INJECTION 76%
INTRAVENOUS | Status: AC
  Administered 2016-11-23: 100 mL
  Filled 2016-11-23: qty 100

## 2016-11-23 MED FILL — predniSONE 20 MG TABS: 20 | 4 days supply | Qty: 8 | Fill #0

## 2016-11-23 MED FILL — BENZONATATE 100 MG CAP: 100 | 15 days supply | Qty: 30 | Fill #0

## 2016-11-23 NOTE — ED Triage Notes (Signed)
Pt states headache and increasing sob with ambulation x 1 month.  States feels like she's choking when she lays down.  States hot/cold chills since yesterday am.

## 2016-11-23 NOTE — ED Notes (Signed)
D-dimer added to blood in main lab.

## 2016-11-23 NOTE — ED Provider Notes (Signed)
[facility] DEPT Provider Note   CSN: [id number] Arrival date & time: 11/23/16  1025     History   Chief Complaint Chief Complaint  Patient presents with  . Shortness of Breath  . Headache    HPI Holly Mueller is a 51 y.o. female.  HPI   Pt with hx asthma p/w increased SOB, DOE, occasional sharp chest pain that has been progressively worsening despite prednisone and tessalon perles.  This has been ongoing for more than 1 month.  The chest pain feels like a "strained muscle."  Cough productive of clear and yellow sputum.  She has pain at the base of her throat that is constant x 1 week, feels like her throat is closing up when she lies flat. Has hx of needing esophagus stretched.  Had recent PFTs at [facility] pulmonology with no results given yet.    Past Medical History:  Diagnosis Date  . Arthritis    bursitis left hip flares-not an issue  . Asthma 2004  . Edentulous    10-19-13 at present  . Epilepsy ([place]) in 1972   No seizures since 1972. Previously treated with phenobarbital.   . GERD (gastroesophageal reflux disease) 2008  . Headache(784.0)    hx of migraines   . Hypothyroidism 2008  . Lower esophageal ring 08/18/2013  . Seasonal allergies 2003  . Shortness of breath     Patient Active Problem List   Diagnosis Date Noted  . Cirrhosis of liver without ascites ([place]) 11/17/2016  . Thrombocytopenia ([place]) 11/17/2016  . Chronic leukopenia 11/17/2016  . Bicytopenia 10/24/2016  . Epistaxis 10/23/2016  . GERD (gastroesophageal reflux disease) 10/23/2016  . Cough 12/09/2015  . Chronic pain of left thumb 10/09/2015  . Precordial chest pain 08/03/2015  . Abscess of skin of abdomen 07/10/2014  . Bursitis of left hip 09/14/2013  . Lower esophageal ring 08/18/2013  . Dyspnea 08/17/2013  . Ganglion cyst of wrist 08/24/2012  . Hip pain, bilateral 08/23/2012  . Umbilical hernia 55/25/8948  . Healthcare maintenance 02/14/2012  . Osteoarthritis 02/12/2012  . Elevated  LFTs 10/30/2011  . Allergic rhinitis 08/26/2011  . Obesity 07/08/2010  . SEIZURES, HX OF 12/12/2009  . DENTAL CARIES 12/18/2008  . DEPRESSION 07/26/2008  . MIGRAINE HEADACHE 11/15/2007  . HYPOTHYROIDISM NOS 10/12/2006  . ASTHMA, EXTRINSIC NOS 07/30/2006    Past Surgical History:  Procedure Laterality Date  . BALLOON DILATION N/A 10/24/2013   Procedure: BALLOON DILATION;  Surgeon: Gatha Mayer, MD;  Location: [facility] ENDOSCOPY;  Service: Endoscopy;  Laterality: N/A;  . CESAREAN SECTION     x2  . DENTAL SURGERY     multiple extractions 3'15  . ESOPHAGOGASTRODUODENOSCOPY N/A 10/24/2013   Procedure: ESOPHAGOGASTRODUODENOSCOPY (EGD);  Surgeon: Gatha Mayer, MD;  Location: [facility];  Service: Endoscopy;  Laterality: N/A;  . LIVER BIOPSY  06/30/2012   Procedure: LIVER BIOPSY;  Surgeon: Shann Medal, MD;  Location: [facility] ORS;  Service: General;;  . NOVASURE ABLATION    . SHOULDER ARTHROSCOPY Right 2011  . UMBILICAL HERNIA REPAIR N/A 06/30/2012   Procedure: remove umbilicus;  Surgeon: Shann Medal, MD;  Location: [facility] ORS;  Service: General;  Laterality: N/A;  . VENTRAL HERNIA REPAIR N/A 06/30/2012   Procedure: LAPAROSCOPIC VENTRAL HERNIA;  Surgeon: Shann Medal, MD;  Location: [facility] ORS;  Service: General;  Laterality: N/A;  With Mesh  . WISDOM TOOTH EXTRACTION      OB History    No data available  Home Medications    Prior to Admission medications   Medication Sig Start Date End Date Taking? Authorizing Provider  albuterol (PROAIR HFA) 108 (90 Base) MCG/ACT inhaler INHALE 2 PUFFS INTO THE LUNGS EVERY 6 HOURS AS NEEDED FOR SHORTNESS OF BREATH 10/23/16  Yes Gonfa, Taye T, MD  albuterol (PROVENTIL) (2.5 MG/3ML) 0.083% nebulizer solution Take 3 mLs (2.5 mg total) by nebulization every 6 (six) hours as needed for wheezing or shortness of breath. 10/23/16  Yes Mercy Riding, MD  budesonide-formoterol (SYMBICORT) 160-4.5 MCG/ACT inhaler Inhale 2 puffs into the lungs 2 (two) times daily.  10/23/16  Yes Mercy Riding, MD  fluticasone (FLONASE) 50 MCG/ACT nasal spray Place 2 sprays into both nostrils daily.   Yes [provider]  levothyroxine (SYNTHROID, LEVOTHROID) 300 MCG tablet Take 1 tablet (300 mcg total) by mouth daily. 12/09/15  Yes McKeag, Marylynn Pearson, MD  pantoprazole (PROTONIX) 40 MG tablet Take 1 tablet (40 mg total) by mouth daily. 10/23/16  Yes Mercy Riding, MD  benzonatate (TESSALON) 100 MG capsule Take 1 capsule (100 mg total) by mouth 2 (two) times daily as needed for cough. 11/23/16   Clayton Bibles, PA-C  guaiFENesin-codeine [facility]) 100-10 MG/5ML syrup Take 5 mLs by mouth 3 (three) times daily as needed for cough. Patient not taking: Reported on 11/23/2016 06/18/16   Nicolette Bang, DO  predniSONE (DELTASONE) 20 MG tablet 2 tabs po daily x 4 days 11/23/16   Clayton Bibles, PA-C    Family History Family History  Problem Relation Age of Onset  . Hypertension Mother   . Diabetes Mother   . Lung cancer Father   . Stroke Father   . Diabetes Sister   . Hypertension Sister     Social History Social History  Substance Use Topics  . Smoking status: Former Smoker    Packs/day: 0.25    Types: Cigarettes    Quit date: 50  . Smokeless tobacco: Never Used  . Alcohol use 0.0 oz/week     Comment: occ     Allergies   Aspirin   Review of Systems Review of Systems  All other systems reviewed and are negative.    Physical Exam Updated Vital Signs BP (!) 129/93   Pulse 77   Temp 98.4 F (36.9 C)   Resp 16   Ht _0  (1.651 m)   Wt 115.2 kg (254 lb)   SpO2 99%   BMI 42.27 kg/m   Physical Exam  Constitutional: She appears well-developed and well-nourished. No distress.  HENT:  Head: Normocephalic and atraumatic.  Neck: Neck supple.  Cardiovascular: Normal rate and regular rhythm.   Pulmonary/Chest: Effort normal. No respiratory distress. She has no wheezes. She has rales (bibasilar rales).  Abdominal: Soft. She exhibits no  distension. There is no tenderness. There is no rebound and no guarding.  Musculoskeletal: She exhibits no edema.  Neurological: She is alert.  Skin: She is not diaphoretic.  Nursing note and vitals reviewed.    ED Treatments / Results  Labs (all labs ordered are listed, but only abnormal results are displayed) Labs Reviewed  CBC WITH DIFFERENTIAL/PLATELET - Abnormal; Notable for the following:       Result Value   WBC 1.9 (*)    Hemoglobin 11.4 (*)    HCT 33.5 (*)    Platelets 79 (*)    Neutro Abs 1.0 (*)    Lymphs Abs 0.6 (*)    All other components within normal limits  BASIC METABOLIC PANEL - Abnormal; Notable for the following:    Glucose, Bld 150 (*)    BUN <5 (*)    All other components within normal limits  D-DIMER, QUANTITATIVE (NOT AT [facility]) - Abnormal; Notable for the following:    D-Dimer, Quant 0.89 (*)    All other components within normal limits  TROPONIN I  BRAIN NATRIURETIC PEPTIDE    EKG  EKG Interpretation  Date/Time:  Monday November 23 2016 11:10:13 EDT Ventricular Rate:  92 PR Interval:  152 QRS Duration: 86 QT Interval:  362 QTC Calculation: 447 R Axis:   -2 Text Interpretation:  Normal sinus rhythm Cannot rule out Anterior infarct , age undetermined Abnormal ECG No STEMI. Similar to prior.  Confirmed by Nanda Quinton [phone]) on 11/23/2016 11:16:03 AM       Radiology Dg Chest 2 View  Result Date: 11/23/2016 CLINICAL DATA:  C/o choking feeling in her throat x 1 week. Headache and weakness x 1 week. States that her asthma seems to be getting worse x 1 mo. Still uses an inhaler. States that she's had a productive cough for years. EXAM: CHEST  2 VIEW COMPARISON:  06/10/2016 FINDINGS: There is mild bilateral chronic interstitial thickening most severe in the left lower lobe. There is no focal parenchymal opacity. There is no pleural effusion or pneumothorax. The heart and mediastinal contours are unremarkable. The osseous structures are unremarkable.  IMPRESSION: No active cardiopulmonary disease. Electronically Signed   By: Kathreen Devoid   On: 11/23/2016 11:35   Ct Angio Chest Pe W/cm &/or Wo Cm  Result Date: 11/23/2016 CLINICAL DATA:  Shortness of breath for a month, feels like she is choking when she lays down. EXAM: CT ANGIOGRAPHY CHEST WITH CONTRAST TECHNIQUE: Multidetector CT imaging of the chest was performed using the standard protocol during bolus administration of intravenous contrast. Multiplanar CT image reconstructions and MIPs were obtained to evaluate the vascular anatomy. CONTRAST:  100 cc Isovue 370 COMPARISON:  Chest radiographs 11/23/2016 FINDINGS: Cardiovascular: No filling defect is identified in the pulmonary arterial tree to suggest pulmonary embolus. No acute aortic findings. Mild cardiomegaly. Mediastinum/Nodes: Low-density mild prominence of thyroid gland, especially the isthmus. Similar to prior. Small prevascular and paratracheal lymph nodes are not pathologically enlarged by size criteria. Lungs/Pleura: Minimal bandlike density anteriorly in the apical segment right upper lobe, likely subsegmental atelectasis. Mild atelectasis also along both hemidiaphragms. Hazy nonspecific ground-glass densities favoring the lower lobes, lingula, and right middle lobe. Much of this is likely secondary to volume loss although a component of alveolitis is not completely excluded. No overt airway thickening or peribronchovascular nodularity. Some of this could be due to mild interstitial edema. Upper Abdomen: Gastrohepatic ligament, porta hepatis, right gastric, and celiac chain lymph nodes. An index right gastric noted node measures 2.0 cm in short axis on image 84/5, formerly 1.2 cm. Other lymph nodes likewise appear somewhat enlarged compared to the 05/26/2012 CT abdomen exam. Nodular hepatic contour compatible cirrhosis. There is clearly splenomegaly although the entirety of the spleen is not included. A splenic artery aneurysm measures least  1.3 cm in diameter. This has a somewhat oblong shape. Aortoiliac atherosclerotic vascular disease. Musculoskeletal: Unremarkable Review of the MIP images confirms the above findings. IMPRESSION: 1. No filling defect is identified in the pulmonary arterial tree to suggest pulmonary embolus. Mildly reduced sensitivity due to patient body habitus. 2. Mild cardiomegaly. Faint interstitial accentuation in the lung bases, potentially from interstitial edema or mild alveolitis. There is also mild atelectasis  favoring the lung bases. 3. Hepatic cirrhosis. The liver is not completely imaged. Splenomegaly is present. 4. Upper abdominal adenopathy is somewhat worsened compared to the prior exam from 2014. Much of this may be reactive given the patient's cirrhosis. Strictly speaking, malignancy is not completely excluded. 5.  Aortic Atherosclerosis (ICD10-I70.0). Electronically Signed   By: Van Clines M.D.   On: 11/23/2016 15:08    Procedures Procedures (including critical care time)  Medications Ordered in ED Medications  ipratropium-albuterol (DUONEB) 0.5-2.5 (3) MG/3ML nebulizer solution 3 mL (3 mLs Nebulization Given 11/23/16 1210)  acetaminophen (TYLENOL) tablet 1,000 mg (1,000 mg Oral Given 11/23/16 1354)  iopamidol (ISOVUE-370) 76 % injection (100 mLs  Contrast Given 11/23/16 1436)     Initial Impression / Assessment and Plan / ED Course  I have reviewed the triage vital signs and the nursing notes.  Pertinent labs & imaging results that were available during my care of the patient were reviewed by me and considered in my medical decision making (see chart for details).     Afebrile, nontoxic patient with chronic asthma with persistent symptoms > 1 month.  She also notes throat symptoms x 1 week - she is able to eat and drink in the room without difficulty.  Workup reassuring.  Doubt ACS, PE. PNA. States she needs another round of prednisone, additional tesalon perles.   D/C home with PCP follow  up.  Discussed result, findings, treatment, and follow up  with patient.  Pt given return precautions.  Pt verbalizes understanding and agrees with plan.       Final Clinical Impressions(s) / ED Diagnoses   Final diagnoses:  Exacerbation of asthma, unspecified asthma severity, unspecified whether persistent    New Prescriptions Discharge Medication List as of 11/23/2016  3:50 PM    START taking these medications   Details  predniSONE (DELTASONE) 20 MG tablet 2 tabs po daily x 4 days, Print         Clayton Bibles, [place] 11/24/16 1103    Long, [place], MD 11/24/16 1523

## 2016-11-23 NOTE — ED Notes (Signed)
Pt acknowledges understanding of DC instructions and that she is taking all of her belongings she brought here, home with her today.

## 2016-11-23 NOTE — Telephone Encounter (Signed)
Pt calling back again a/b results, please advise.Holly Mueller

## 2016-11-23 NOTE — ED Notes (Signed)
Pt given [place] sandwich and gingerale to eat.

## 2016-11-23 NOTE — ED Notes (Signed)
Cannot locate pt

## 2016-11-23 NOTE — Discharge Instructions (Signed)
Read the information below.  Use the prescribed medication as directed.  Please discuss all new medications with your pharmacist.  You may return to the Emergency Department at any time for worsening condition or any new symptoms that concern you.   If you develop worsening shortness of breath, uncontrolled wheezing, severe chest pain, or fevers despite using tylenol and/or ibuprofen, return for a recheck.

## 2016-11-23 NOTE — Telephone Encounter (Signed)
Pt aware that PM will be in clinic this afternoon, and once he has reviewed PFT we will contact her.

## 2016-11-23 NOTE — ED Notes (Signed)
ED Provider at bedside.

## 2016-11-23 NOTE — ED Notes (Signed)
Patient transported to X-ray 

## 2016-11-24 NOTE — Telephone Encounter (Signed)
PM please advise of these results.  Thanks

## 2016-11-24 NOTE — Telephone Encounter (Signed)
Spoke with pt, scheduled rov for next available appt time.  Nothing further needed.

## 2016-11-24 NOTE — Telephone Encounter (Signed)
I called and updated pt over the phone She got evaluated in ED with dyspnea, cough, sinus and throat congestion. CTA did not show PE, there was mild atelectasis, GGO at base. PFTs do not show obstruction. There is restriction but no ILD on yesterdays CT  She been given prednisone by ED and is starting to feel better I advised her to take mucinex and zyrtec OTC as well  Please make a follow up appointment in clinic with me. Thanks  Marshell Garfinkel MD [facility]

## 2016-12-01 ENCOUNTER — Other Ambulatory Visit: Payer: Self-pay | Admitting: Radiology

## 2016-12-01 ENCOUNTER — Other Ambulatory Visit: Payer: Self-pay | Admitting: Student

## 2016-12-01 MED FILL — SYMBICORT 160-4.5 MCG INH: 160-4.5 | 30 days supply | Qty: 10 | Fill #1

## 2016-12-01 MED FILL — PANTOPRAZOLE SOD DR 40 MG T: 40 | 30 days supply | Qty: 30 | Fill #1

## 2016-12-01 MED FILL — LEVOTHYROXINE 300 MCG TAB: 300 | 30 days supply | Qty: 30 | Fill #2

## 2016-12-01 MED FILL — PROAIR HFA 90 MCG INHALER: 108 (90 BAS | 25 days supply | Qty: 9 | Fill #1

## 2016-12-02 ENCOUNTER — Ambulatory Visit (HOSPITAL_COMMUNITY)
Admission: RE | Admit: 2016-12-02 | Discharge: 2016-12-02 | Disposition: A | Discharge status: Home / Self Care | Payer: Medicaid Other | Source: Ambulatory Visit | Attending: Hematology and Oncology | Admitting: Hematology and Oncology

## 2016-12-02 ENCOUNTER — Encounter: Payer: Medicaid Other | Admitting: Hematology

## 2016-12-02 ENCOUNTER — Encounter (HOSPITAL_COMMUNITY): Payer: Self-pay

## 2016-12-02 DIAGNOSIS — G40909 Epilepsy, unspecified, not intractable, without status epilepticus: Secondary | ICD-10-CM | POA: Insufficient documentation

## 2016-12-02 DIAGNOSIS — Z8249 Family history of ischemic heart disease and other diseases of the circulatory system: Secondary | ICD-10-CM | POA: Diagnosis not present

## 2016-12-02 DIAGNOSIS — D696 Thrombocytopenia, unspecified: Secondary | ICD-10-CM

## 2016-12-02 DIAGNOSIS — Z87891 Personal history of nicotine dependence: Secondary | ICD-10-CM | POA: Diagnosis not present

## 2016-12-02 DIAGNOSIS — Z886 Allergy status to analgesic agent status: Secondary | ICD-10-CM | POA: Insufficient documentation

## 2016-12-02 DIAGNOSIS — Z833 Family history of diabetes mellitus: Secondary | ICD-10-CM | POA: Insufficient documentation

## 2016-12-02 DIAGNOSIS — Z801 Family history of malignant neoplasm of trachea, bronchus and lung: Secondary | ICD-10-CM | POA: Insufficient documentation

## 2016-12-02 DIAGNOSIS — K219 Gastro-esophageal reflux disease without esophagitis: Secondary | ICD-10-CM | POA: Insufficient documentation

## 2016-12-02 DIAGNOSIS — Z9889 Other specified postprocedural states: Secondary | ICD-10-CM | POA: Insufficient documentation

## 2016-12-02 DIAGNOSIS — E039 Hypothyroidism, unspecified: Secondary | ICD-10-CM | POA: Diagnosis not present

## 2016-12-02 DIAGNOSIS — D759 Disease of blood and blood-forming organs, unspecified: Secondary | ICD-10-CM | POA: Insufficient documentation

## 2016-12-02 DIAGNOSIS — D61818 Other pancytopenia: Secondary | ICD-10-CM | POA: Diagnosis not present

## 2016-12-02 DIAGNOSIS — D72819 Decreased white blood cell count, unspecified: Secondary | ICD-10-CM

## 2016-12-02 DIAGNOSIS — Z79899 Other long term (current) drug therapy: Secondary | ICD-10-CM | POA: Insufficient documentation

## 2016-12-02 DIAGNOSIS — Z823 Family history of stroke: Secondary | ICD-10-CM | POA: Diagnosis not present

## 2016-12-02 DIAGNOSIS — R51 Headache: Secondary | ICD-10-CM | POA: Insufficient documentation

## 2016-12-02 DIAGNOSIS — K7460 Unspecified cirrhosis of liver: Secondary | ICD-10-CM | POA: Insufficient documentation

## 2016-12-02 DIAGNOSIS — J45909 Unspecified asthma, uncomplicated: Secondary | ICD-10-CM | POA: Insufficient documentation

## 2016-12-02 DIAGNOSIS — R0989 Other specified symptoms and signs involving the circulatory and respiratory systems: Secondary | ICD-10-CM | POA: Diagnosis not present

## 2016-12-02 LAB — CBC WITH DIFFERENTIAL/PLATELET
BASOS PCT: 1 %
Basophils Absolute: 0 10*3/uL (ref 0.0–0.1)
EOS PCT: 4 %
Eosinophils Absolute: 0.1 10*3/uL (ref 0.0–0.7)
HEMATOCRIT: 32.4 % — AB (ref 36.0–46.0)
Hemoglobin: 11.5 g/dL — ABNORMAL LOW (ref 12.0–15.0)
LYMPHS ABS: 0.4 10*3/uL — AB (ref 0.7–4.0)
Lymphocytes Relative: 32 %
MCH: 29.2 pg (ref 26.0–34.0)
MCHC: 35.5 g/dL (ref 30.0–36.0)
MCV: 82.2 fL (ref 78.0–100.0)
MONOS PCT: 15 %
Monocytes Absolute: 0.2 10*3/uL (ref 0.1–1.0)
Neutro Abs: 0.7 10*3/uL — ABNORMAL LOW (ref 1.7–7.7)
Neutrophils Relative %: 48 %
Platelets: 66 10*3/uL — ABNORMAL LOW (ref 150–400)
RBC: 3.94 MIL/uL (ref 3.87–5.11)
RDW: 13.7 % (ref 11.5–15.5)
WBC: 1.4 10*3/uL — AB (ref 4.0–10.5)

## 2016-12-02 LAB — PROTIME-INR
INR: 1.23
Prothrombin Time: 15.5 seconds — ABNORMAL HIGH (ref 11.4–15.2)

## 2016-12-02 MED ORDER — NALOXONE HCL 0.4 MG/ML IJ SOLN
INTRAMUSCULAR | Status: AC
  Filled 2016-12-02: qty 1

## 2016-12-02 MED ORDER — FENTANYL CITRATE (PF) 100 MCG/2ML IJ SOLN
INTRAMUSCULAR | Status: AC | PRN
  Administered 2016-12-02 (×2): 50 ug via INTRAVENOUS

## 2016-12-02 MED ORDER — MIDAZOLAM HCL 2 MG/2ML IJ SOLN
INTRAMUSCULAR | Status: AC
  Filled 2016-12-02: qty 4

## 2016-12-02 MED ORDER — FENTANYL CITRATE (PF) 100 MCG/2ML IJ SOLN
INTRAMUSCULAR | Status: AC
  Filled 2016-12-02: qty 4

## 2016-12-02 MED ORDER — SODIUM CHLORIDE 0.9 % IV SOLN
INTRAVENOUS | Status: DC
  Administered 2016-12-02: 09:00:00 via INTRAVENOUS

## 2016-12-02 MED ORDER — LIDOCAINE HCL (PF) 1 % IJ SOLN
INTRAMUSCULAR | Status: AC | PRN
  Administered 2016-12-02: 10 mL via INTRADERMAL

## 2016-12-02 MED ORDER — MIDAZOLAM HCL 2 MG/2ML IJ SOLN
INTRAMUSCULAR | Status: AC | PRN
  Administered 2016-12-02 (×2): 1 mg via INTRAVENOUS

## 2016-12-02 MED ORDER — FLUMAZENIL 0.5 MG/5ML IV SOLN
INTRAVENOUS | Status: AC
  Filled 2016-12-02: qty 5

## 2016-12-02 NOTE — Progress Notes (Signed)
CRITICAL VALUE ALERT  Critical Value:  WBC 1.4  Date & Time Notied:  12/02/16 09:26  Provider Notified: Rowe Robert, PA  Orders Received/Actions taken: no additional orders given

## 2016-12-02 NOTE — Consult Note (Signed)
Chief Complaint: Patient was seen in consultation today for CT-guided bone marrow biopsy  Referring Physician(s): [place] G  Supervising Physician: Aletta Edouard  Patient Status: [place]  History of Present Illness: Holly Mueller is a 51 y.o. female with history of chronic leukopenia and thrombocytopenia since 2014 as well as cirrhosis who presents today for CT-guided bone marrow biopsy for further evaluation.  Past Medical History:  Diagnosis Date  . Arthritis    bursitis left hip flares-not an issue  . Asthma 2004  . Edentulous    10-19-13 at present  . Epilepsy ([place]) in 1972   No seizures since 1972. Previously treated with phenobarbital.   . GERD (gastroesophageal reflux disease) 2008  . Headache(784.0)    hx of migraines   . Hypothyroidism 2008  . Lower esophageal ring 08/18/2013  . Seasonal allergies 2003  . Shortness of breath     Past Surgical History:  Procedure Laterality Date  . BALLOON DILATION N/A 10/24/2013   Procedure: BALLOON DILATION;  Surgeon: Gatha Mayer, MD;  Location: [facility];  Service: Endoscopy;  Laterality: N/A;  . CESAREAN SECTION     x2  . DENTAL SURGERY     multiple extractions 3'15  . ESOPHAGOGASTRODUODENOSCOPY N/A 10/24/2013   Procedure: ESOPHAGOGASTRODUODENOSCOPY (EGD);  Surgeon: Gatha Mayer, MD;  Location: [facility];  Service: Endoscopy;  Laterality: N/A;  . LIVER BIOPSY  06/30/2012   Procedure: LIVER BIOPSY;  Surgeon: Shann Medal, MD;  Location: [facility];  Service: General;;  . NOVASURE ABLATION    . SHOULDER ARTHROSCOPY Right 2011  . UMBILICAL HERNIA REPAIR N/A 06/30/2012   Procedure: remove umbilicus;  Surgeon: Shann Medal, MD;  Location: [facility];  Service: General;  Laterality: N/A;  . VENTRAL HERNIA REPAIR N/A 06/30/2012   Procedure: LAPAROSCOPIC VENTRAL HERNIA;  Surgeon: Shann Medal, MD;  Location: [facility];  Service: General;  Laterality: N/A;  With Mesh  . WISDOM TOOTH EXTRACTION       Allergies: Aspirin  Medications: Prior to Admission medications   Medication Sig Start Date End Date Taking? Authorizing Provider  albuterol (PROAIR HFA) 108 (90 Base) MCG/ACT inhaler INHALE 2 PUFFS INTO THE LUNGS EVERY 6 HOURS AS NEEDED FOR SHORTNESS OF BREATH 10/23/16  Yes Gonfa, Taye T, MD  albuterol (PROVENTIL) (2.5 MG/3ML) 0.083% nebulizer solution Take 3 mLs (2.5 mg total) by nebulization every 6 (six) hours as needed for wheezing or shortness of breath. 10/23/16  Yes Mercy Riding, MD  benzonatate (TESSALON) 100 MG capsule Take 1 capsule (100 mg total) by mouth 2 (two) times daily as needed for cough. 11/23/16  Yes West, Emily, PA-C  budesonide-formoterol (SYMBICORT) 160-4.5 MCG/ACT inhaler Inhale 2 puffs into the lungs 2 (two) times daily. 10/23/16  Yes Mercy Riding, MD  fluticasone (FLONASE) 50 MCG/ACT nasal spray Place 2 sprays into both nostrils daily.   Yes [provider]  levothyroxine (SYNTHROID, LEVOTHROID) 300 MCG tablet Take 1 tablet (300 mcg total) by mouth daily. 12/09/15  Yes McKeag, Marylynn Pearson, MD  pantoprazole (PROTONIX) 40 MG tablet Take 1 tablet (40 mg total) by mouth daily. 10/23/16  Yes Gonfa, Charlesetta Ivory, MD  guaiFENesin-codeine (ROBITUSSIN AC) 100-10 MG/5ML syrup Take 5 mLs by mouth 3 (three) times daily as needed for cough. Patient not taking: Reported on 11/23/2016 06/18/16   Nicolette Bang, DO  predniSONE (DELTASONE) 20 MG tablet 2 tabs po daily x 4 days 11/23/16   Clayton Bibles, [place]  Family History  Problem Relation Age of Onset  . Hypertension Mother   . Diabetes Mother   . Lung cancer Father   . Stroke Father   . Diabetes Sister   . Hypertension Sister     Social History   Social History  . Marital status: Single    Spouse name: N/A  . Number of children: 2  . Years of education: N/A   Occupational History  .  [place]   Social History Main Topics  . Smoking status: Former Smoker    Packs/day: 0.25    Types: Cigarettes    Quit  date: 34  . Smokeless tobacco: Never Used  . Alcohol use 0.0 oz/week     Comment: occ  . Drug use: No  . Sexual activity: No   Other Topics Concern  . None   Social History Narrative   Works in Estate manager/land agent at [employer].    Lives with 12 and 14 (boys).    Also living with close friends of the family (mother and two kids, adults).            Review of Systems currently denies fever, headache, chest pain, cough, abdominal/back pain, nausea, vomiting or bleeding. She does have some dyspnea with exertion.  Vital Signs:  Temperature 98.1, heart rate 75, respirations 20, BP 134/72, O2 sat 96% room air   Physical Exam awake, alert. Chest with few bibasilar crackles. Heart with regular rate and rhythm. Abdomen obese, soft, positive bowel sounds, nontender. Trace to 1+ pretibial edema bilaterally.  Mallampati Score:     Imaging: Dg Chest 2 View  Result Date: 11/23/2016 CLINICAL DATA:  C/o choking feeling in her throat x 1 week. Headache and weakness x 1 week. States that her asthma seems to be getting worse x 1 mo. Still uses an inhaler. States that she's had a productive cough for years. EXAM: CHEST  2 VIEW COMPARISON:  06/10/2016 FINDINGS: There is mild bilateral chronic interstitial thickening most severe in the left lower lobe. There is no focal parenchymal opacity. There is no pleural effusion or pneumothorax. The heart and mediastinal contours are unremarkable. The osseous structures are unremarkable. IMPRESSION: No active cardiopulmonary disease. Electronically Signed   By: Kathreen Devoid   On: 11/23/2016 11:35   Ct Angio Chest Pe W/cm &/or Wo Cm  Result Date: 11/23/2016 CLINICAL DATA:  Shortness of breath for a month, feels like she is choking when she lays down. EXAM: CT ANGIOGRAPHY CHEST WITH CONTRAST TECHNIQUE: Multidetector CT imaging of the chest was performed using the standard protocol during bolus administration of intravenous contrast. Multiplanar CT image  reconstructions and MIPs were obtained to evaluate the vascular anatomy. CONTRAST:  100 cc Isovue 370 COMPARISON:  Chest radiographs 11/23/2016 FINDINGS: Cardiovascular: No filling defect is identified in the pulmonary arterial tree to suggest pulmonary embolus. No acute aortic findings. Mild cardiomegaly. Mediastinum/Nodes: Low-density mild prominence of thyroid gland, especially the isthmus. Similar to prior. Small prevascular and paratracheal lymph nodes are not pathologically enlarged by size criteria. Lungs/Pleura: Minimal bandlike density anteriorly in the apical segment right upper lobe, likely subsegmental atelectasis. Mild atelectasis also along both hemidiaphragms. Hazy nonspecific ground-glass densities favoring the lower lobes, lingula, and right middle lobe. Much of this is likely secondary to volume loss although a component of alveolitis is not completely excluded. No overt airway thickening or peribronchovascular nodularity. Some of this could be due to mild interstitial edema. Upper Abdomen: Gastrohepatic ligament, porta hepatis, right gastric, and celiac  chain lymph nodes. An index right gastric noted node measures 2.0 cm in short axis on image 84/5, formerly 1.2 cm. Other lymph nodes likewise appear somewhat enlarged compared to the 05/26/2012 CT abdomen exam. Nodular hepatic contour compatible cirrhosis. There is clearly splenomegaly although the entirety of the spleen is not included. A splenic artery aneurysm measures least 1.3 cm in diameter. This has a somewhat oblong shape. Aortoiliac atherosclerotic vascular disease. Musculoskeletal: Unremarkable Review of the MIP images confirms the above findings. IMPRESSION: 1. No filling defect is identified in the pulmonary arterial tree to suggest pulmonary embolus. Mildly reduced sensitivity due to patient body habitus. 2. Mild cardiomegaly. Faint interstitial accentuation in the lung bases, potentially from interstitial edema or mild alveolitis.  There is also mild atelectasis favoring the lung bases. 3. Hepatic cirrhosis. The liver is not completely imaged. Splenomegaly is present. 4. Upper abdominal adenopathy is somewhat worsened compared to the prior exam from 2014. Much of this may be reactive given the patient's cirrhosis. Strictly speaking, malignancy is not completely excluded. 5.  Aortic Atherosclerosis (ICD10-I70.0). Electronically Signed   By: Van Clines M.D.   On: 11/23/2016 15:08    Labs:  CBC:  Recent Labs  10/23/16 1152 11/17/16 1204 11/23/16 1111 12/02/16 0858  WBC 1.7* 1.7* 1.9* 1.4*  HGB 12.7 12.5 11.4* 11.5*  HCT 37.4 36.2 33.5* 32.4*  PLT 73* 86* 79* 66*    COAGS:  Recent Labs  12/02/16 0858  INR 1.23    BMP:  Recent Labs  11/17/16 1208 11/23/16 1111  NA 137 136  K 4.2 3.7  CL  --  108  CO2 24 23  GLUCOSE 216* 150*  BUN 7.2 <5*  CALCIUM 9.3 8.9  CREATININE 0.8 0.65  GFRNONAA  --  >60  GFRAA  --  >60    LIVER FUNCTION TESTS:  Recent Labs  11/17/16 1205 11/17/16 1208  BILITOT  --  2.38*  AST  --  61*  ALT  --  46  ALKPHOS  --  148  PROT 7.9 8.4*  ALBUMIN  --  3.3*    TUMOR MARKERS: No results for input(s): AFPTM, CEA, CA199, CHROMGRNA in the last 8760 hours.  Assessment and Plan: 51 y.o. female with history of chronic leukopenia and thrombocytopenia since 2014 as well as cirrhosis who presents today for CT-guided bone marrow biopsy for further evaluation.Risks and benefits discussed with the patient/family including, but not limited to bleeding, infection, damage to adjacent structures or low yield requiring additional tests.All of the patient's questions were answered, patient is agreeable to proceed.Consent signed and in chart.     Thank you for this interesting consult.  I greatly enjoyed meeting EARNIE ROCKHOLD and look forward to participating in their care.  A copy of this report was sent to the requesting provider on this date.  Electronically Signed: D.  Rowe Robert, PA-C 12/02/2016, 9:46 AM   I spent a total of 20 minutes   in face to face in clinical consultation, greater than 50% of which was counseling/coordinating care for CT-guided bone marrow biopsy

## 2016-12-02 NOTE — Discharge Instructions (Signed)
Bone Marrow Aspiration and Bone Marrow Biopsy, Adult, Care After °This sheet gives you information about how to care for yourself after your procedure. Your health care provider may also give you more specific instructions. If you have problems or questions, contact your health care provider. °What can I expect after the procedure? °After the procedure, it is common to have: °· Mild pain and tenderness. °· Swelling. °· Bruising. ° °Follow these instructions at home: °· Take over-the-counter or prescription medicines only as told by your health care provider. °· Do not take baths, swim, or use a hot tub until your health care provider approves. Ask if you can take a shower or have a sponge bath. °· Follow instructions from your health care provider about how to take care of the puncture site. Make sure you: °? Wash your hands with soap and water before you change your bandage (dressing). If soap and water are not available, use hand sanitizer. °? Change your dressing as told by your health care provider. °· Check your puncture site every day for signs of infection. Check for: °? More redness, swelling, or pain. °? More fluid or blood. °? Warmth. °? Pus or a bad smell. °· Return to your normal activities as told by your health care provider. Ask your health care provider what activities are safe for you. °· Do not drive for 24 hours if you were given a medicine to help you relax (sedative). °· Keep all follow-up visits as told by your health care provider. This is important. °Contact a health care provider if: °· You have more redness, swelling, or pain around the puncture site. °· You have more fluid or blood coming from the puncture site. °· Your puncture site feels warm to the touch. °· You have pus or a bad smell coming from the puncture site. °· You have a fever. °· Your pain is not controlled with medicine. °This information is not intended to replace advice given to you by your health care provider. Make sure  you discuss any questions you have with your health care provider. °Document Released: 11/14/2004 Document Revised: 11/15/2015 Document Reviewed: 10/09/2015 °Elsevier Interactive Patient Education © 2018 Elsevier Inc[employer] ° °Moderate Conscious Sedation, Adult, Care After °These instructions provide you with information about caring for yourself after your procedure. Your health care provider may also give you more specific instructions. Your treatment has been planned according to current medical practices, but problems sometimes occur. Call your health care provider if you have any problems or questions after your procedure. °What can I expect after the procedure? °After your procedure, it is common: °· To feel sleepy for several hours. °· To feel clumsy and have poor balance for several hours. °· To have poor judgment for several hours. °· To vomit if you eat too soon. ° °Follow these instructions at home: °For at least 24 hours after the procedure: ° °· Do not: °? Participate in activities where you could fall or become injured. °? Drive. °? Use heavy machinery. °? Drink alcohol. °? Take sleeping pills or medicines that cause drowsiness. °? Make important decisions or sign legal documents. °? Take care of children on your own. °· Rest. °Eating and drinking °· Follow the diet recommended by your health care provider. °· If you vomit: °? Drink water, juice, or soup when you can drink without vomiting. °? Make sure you have little or no nausea before eating solid foods. °General instructions °· Have a responsible adult stay with you until you   are awake and alert.  Take over-the-counter and prescription medicines only as told by your health care provider.  If you smoke, do not smoke without supervision.  Keep all follow-up visits as told by your health care provider. This is important. Contact a health care provider if:  You keep feeling nauseous or you keep vomiting.  You feel light-headed.  You develop  a rash.  You have a fever. Get help right away if:  You have trouble breathing. This information is not intended to replace advice given to you by your health care provider. Make sure you discuss any questions you have with your health care provider. Document Released: 02/15/2013 Document Revised: 09/30/2015 Document Reviewed: 08/17/2015 Elsevier Interactive Patient Education  [employer].

## 2016-12-02 NOTE — Procedures (Signed)
Interventional Radiology Procedure Note  Procedure: CT guided aspirate and core biopsy of right iliac bone Complications: None Recommendations: - Bedrest supine x 1 hrs - Follow biopsy results  Granvil Djordjevic T. Kathlene Cote, M.D Pager:  [phone]

## 2016-12-04 ENCOUNTER — Ambulatory Visit: Payer: Medicaid Other | Admitting: Family Medicine

## 2016-12-15 ENCOUNTER — Ambulatory Visit (HOSPITAL_BASED_OUTPATIENT_CLINIC_OR_DEPARTMENT_OTHER): Payer: Medicaid Other | Admitting: Hematology and Oncology

## 2016-12-15 ENCOUNTER — Telehealth: Payer: Self-pay | Admitting: Hematology and Oncology

## 2016-12-15 VITALS — BP 126/63 | HR 89 | Temp 98.6°F | Resp 18 | Ht 65.0 in | Wt 254.6 lb

## 2016-12-15 DIAGNOSIS — K7460 Unspecified cirrhosis of liver: Secondary | ICD-10-CM | POA: Diagnosis not present

## 2016-12-15 DIAGNOSIS — D696 Thrombocytopenia, unspecified: Secondary | ICD-10-CM | POA: Diagnosis not present

## 2016-12-15 DIAGNOSIS — D72819 Decreased white blood cell count, unspecified: Secondary | ICD-10-CM | POA: Diagnosis not present

## 2016-12-15 NOTE — Telephone Encounter (Signed)
Scheduled appt per 8/7 los - Gave patient AVS and calender per los. [place] to be scheduled by [facility].

## 2016-12-17 ENCOUNTER — Encounter (HOSPITAL_COMMUNITY): Payer: Self-pay

## 2016-12-17 NOTE — Assessment & Plan Note (Signed)
Initially suspected in February 2014 during laparoscopic hernia repair. Biopsy obtained at that time was consistent with cirrhosis, but I do not see any specific follow-up arranged for the problems since then or additional evaluation of the patient. No recent liver function tests to assess the state of the hepatic function.  Patient is not the most informative historian, but based on the current report, I do not think this is alcohol or recreational substance induced. Additional differential includes autoimmune hepatitis leading to cirrhosis, versus viral hepatic cirrhosis. Testing is negative so far. Patient did not obtain the ultrasound that I have requested.  Plan: --RtUQ [place] --Referral to gastroenterology for additional evaluation and management of unexplained liver cirrhosis pending.

## 2016-12-17 NOTE — Assessment & Plan Note (Addendum)
51 year old female referred to our clinic for evaluation of bicytopenia with leukopenia and thrombocytopenia. Review of the trends of the white blood cell counts reveal neutropenia and lymphopenia which have developed gradually since 2014. Process is chronic and reasonably stable with no significant change in the blood counts from October 2017 to in June 2018. No basophilia or monocytosis at this time. No evidence of immature forms presence historically. Neutropenia at this time is moderate. Has no evidence of recurrent infections history and reports.  Due to the degree of cytopenia, bone marrow biopsy was obtained demonstrating no evidence of myelodysplasia or leukemia based on morphology. Molecular and cytogenetic studies are still pending at this point in time. At this time, while we are awaiting the results of additional evaluation, I recommend patient to focus on evaluation of her liver status. It is possible that the underlying liver dysfunction is responsible for both thrombocytopenia and leukopenia.  Plan: --Observation --RTC 1 month with repeat labs

## 2016-12-17 NOTE — Progress Notes (Signed)
[facility] Follow-up Visit:  Assessment: Chronic leukopenia 51 year old female referred to our clinic for evaluation of bicytopenia with leukopenia and thrombocytopenia. Review of the trends of the white blood cell counts reveal neutropenia and lymphopenia which have developed gradually since 2014. Process is chronic and reasonably stable with no significant change in the blood counts from October 2017 to in June 2018. No basophilia or monocytosis at this time. No evidence of immature forms presence historically. Neutropenia at this time is moderate. Has no evidence of recurrent infections history and reports.  Due to the degree of cytopenia, bone marrow biopsy was obtained demonstrating no evidence of myelodysplasia or leukemia based on morphology. Molecular and cytogenetic studies are still pending at this point in time. At this time, while we are awaiting the results of additional evaluation, I recommend patient to focus on evaluation of her liver status. It is possible that the underlying liver dysfunction is responsible for both thrombocytopenia and leukopenia.  Plan: --Observation --RTC 1 month with repeat labs    Cirrhosis of liver without ascites ([place]) Initially suspected in February 2014 during laparoscopic hernia repair. Biopsy obtained at that time was consistent with cirrhosis, but I do not see any specific follow-up arranged for the problems since then or additional evaluation of the patient. No recent liver function tests to assess the state of the hepatic function.  Patient is not the most informative historian, but based on the current report, I do not think this is alcohol or recreational substance induced. Additional differential includes autoimmune hepatitis leading to cirrhosis, versus viral hepatic cirrhosis. Testing is negative so far. Patient did not obtain the ultrasound that I have requested.  Plan: --RtUQ [place] --Referral to gastroenterology for  additional evaluation and management of unexplained liver cirrhosis pending.   Orders Placed This Encounter  Procedures  . CBC & Diff and Retic    Standing Status:   Future    Standing Expiration Date:   12/15/2017  . Comprehensive metabolic panel    Standing Status:   Future    Standing Expiration Date:   12/15/2017   All questions were answered.  . The patient knows to call the clinic with any problems, questions or concerns.  This note was electronically signed.    History of Presenting Illness Holly Mueller 51 y.o. followed in the [place] for evaluation of bicytopenia. See hematological history below for the details of labwork todate. The present time, patient mainly complains of shortness of breath that she attributes to asthma diagnosis. She also complains of low energy. Patient denies recurrent infections, denies fevers, chills, night sweats. No unexpected weight change. She denies any significant changes to her appetite or activity tolerance changes that are not related to shortness of breath. She denies nausea, vomiting, abdominal pain, diarrhea, or constipation. Denies dysuria or hematuria. Denies any swelling in the neck, armpits, or groin. Denies any skin rashes or easy bruisability.  Patient denies any history of alcohol abuse. She does not drink at the present time at all, and previously only has been occasional and social drinker. She denies any history of IV drug use, mushroom abuse, or spider bites. Denies previous exposure to significant amounts of solvents or other chemicals other than the usual use of cleaning supplies at work and at home.  Additional review of the history reveals hospitalization in February 2014. During that time, and was diagnosed with an incarcerated umbilical hernia and incarcerated ventral hernia and she underwent surgical repair for both situations  with liver biopsies obtained demonstrating hepatic cirrhosis at that time.  Patient returns to  the clinic to review results of the workup conducted following the last visit. Patient denies any new complaints since the last visit to the clinic.   Oncological/hematological History: --Liver Bx, 06/30/12: Fibrosis with chronic and active inflammation consistent with cirrhosis. --Labs, 04/25/13: WBC 4.2, ANC ..., ALC ..., Hgb 13.2, MCV 85.9, MCH 30.1, Plt 164; --Labs, 07/13/13: WBC 3.2, ANC ..., ALC ..., Hgb 12.1, MCV 86.1, MCH 30.0, Plt 115; --EGD, Jun 2015: Positive for Schatzki's ring, his esophagitis, rhinitis, negative for H. pylori, biopsy is negative for eosinophilic infiltrates. --Labs, 02/08/14: WBC 2.1, ANC ..., ALC ..., Hgb 11.7, MCV 86.2, MCH 29.4, Plt 105; --Labs, 03/06/16: WBC 1.6, ANC 0.8, ALC 0.6, Baso 1.7%, Hgb 12.5, MCV 85.2, MCH ..., Plt 78 --Labs, 10/23/16: WBC 1.7, ANC 0.9, ALC 0.6, Baso 2.0%, Hgb 12.7, MCV 88.0, MCH 29.7, Plt 73; --Labs, 11/17/16: WBC 1.7, ANC 0.9, ALC 0.6, Baso 0.0, Hgb 12.5, Retic 3.98%(up), MCV 86.8, MCH 30.0, Plt 86; tBili 2.4, tProt 8.4, Alb 3.3, AP 148, AST 61, ALT 46; ESR 18(1nl), CRP 7.2(up), LDH 258, ANA negative, C376(down), C4 12(down); SPEP -- no evidence of monoclonal protein presence; Cu 122(wnl), heavy metals -- neagtive; TSH 0.081; negative for active infection with CMV, EBV, hepatitis B, hepatitis C, HIV. --BM Bx, 12/02/16: Hypercellular bone marrow without evidence of dyspoiesis and no elevation of blast count. Cytogenetics and FISH are still pending.  Medical History: Past Medical History:  Diagnosis Date  . Arthritis    bursitis left hip flares-not an issue  . Asthma 2004  . Edentulous    10-19-13 at present  . Epilepsy ([place]) in 1972   No seizures since 1972. Previously treated with phenobarbital.   . GERD (gastroesophageal reflux disease) 2008  . Headache(784.0)    hx of migraines   . Hypothyroidism 2008  . Lower esophageal ring 08/18/2013  . Seasonal allergies 2003  . Shortness of breath     Surgical History: Past  Surgical History:  Procedure Laterality Date  . BALLOON DILATION N/A 10/24/2013   Procedure: BALLOON DILATION;  Surgeon: Gatha Mayer, MD;  Location: [facility];  Service: Endoscopy;  Laterality: N/A;  . CESAREAN SECTION     x2  . DENTAL SURGERY     multiple extractions 3'15  . ESOPHAGOGASTRODUODENOSCOPY N/A 10/24/2013   Procedure: ESOPHAGOGASTRODUODENOSCOPY (EGD);  Surgeon: Gatha Mayer, MD;  Location: [facility];  Service: Endoscopy;  Laterality: N/A;  . LIVER BIOPSY  06/30/2012   Procedure: LIVER BIOPSY;  Surgeon: Shann Medal, MD;  [facility]: [facility];  Service: General;;  . NOVASURE ABLATION    . SHOULDER ARTHROSCOPY Right 2011  . UMBILICAL HERNIA REPAIR N/A 06/30/2012   Procedure: remove umbilicus;  Surgeon: Shann Medal, MD;  [facility];  Service: General;  Laterality: N/A;  . VENTRAL HERNIA REPAIR N/A 06/30/2012   Procedure: LAPAROSCOPIC VENTRAL HERNIA;  Surgeon: Shann Medal, MD;  [facility];  Service: General;  Laterality: N/A;  With Mesh  . WISDOM TOOTH EXTRACTION      Family History: Family History  Problem Relation Age of Onset  . Hypertension Mother   . Diabetes Mother   . Lung cancer Father   . Stroke Father   . Diabetes Sister   . Hypertension Sister     Social History: Social History   Social History  . Marital status: Single    Spouse name: N/A  . Number  of children: 2  . Years of education: N/A   Occupational History  .  [employer]   Social History Main Topics  . Smoking status: Former Smoker    Packs/day: 0.25    Types: Cigarettes    Quit date: 37  . Smokeless tobacco: Never Used  . Alcohol use 0.0 oz/week     Comment: occ  . Drug use: No  . Sexual activity: No   Other Topics Concern  . Not on file   Social History Narrative   Works in Estate manager/land agent at [employer].    Lives with 12 and 14 (boys).    Also living with close friends of the family (mother and two kids, adults).           Allergies: Allergies    Allergen Reactions  . Aspirin Nausea Only    Upset stomach    Medications:  Current Outpatient Prescriptions  Medication Sig Dispense Refill  . albuterol (PROAIR HFA) 108 (90 Base) MCG/ACT inhaler INHALE 2 PUFFS INTO THE LUNGS EVERY 6 HOURS AS NEEDED FOR SHORTNESS OF BREATH 8.5 g 5  . albuterol (PROVENTIL) (2.5 MG/3ML) 0.083% nebulizer solution Take 3 mLs (2.5 mg total) by nebulization every 6 (six) hours as needed for wheezing or shortness of breath. 75 mL 2  . benzonatate (TESSALON) 100 MG capsule Take 1 capsule (100 mg total) by mouth 2 (two) times daily as needed for cough. 30 capsule 0  . budesonide-formoterol (SYMBICORT) 160-4.5 MCG/ACT inhaler Inhale 2 puffs into the lungs 2 (two) times daily. 1 Inhaler 2  . fluticasone (FLONASE) 50 MCG/ACT nasal spray Place 2 sprays into both nostrils daily.    Marland Kitchen guaiFENesin-codeine (ROBITUSSIN AC) 100-10 MG/5ML syrup Take 5 mLs by mouth 3 (three) times daily as needed for cough. (Patient not taking: Reported on 11/23/2016) 120 mL 0  . levothyroxine (SYNTHROID, LEVOTHROID) 300 MCG tablet Take 1 tablet (300 mcg total) by mouth daily. 90 tablet 3  . pantoprazole (PROTONIX) 40 MG tablet Take 1 tablet (40 mg total) by mouth daily. 30 tablet 3  . predniSONE (DELTASONE) 20 MG tablet 2 tabs po daily x 4 days 8 tablet 0   No current facility-administered medications for this visit.     Review of Systems: Review of Systems  All other systems reviewed and are negative.    PHYSICAL EXAMINATION Blood pressure 126/63, pulse 89, temperature 98.6 F (37 C), temperature source Oral, resp. rate 18, height _0  (1.651 m), weight 254 lb 9.6 oz (115.5 kg), SpO2 95 %.  ECOG PERFORMANCE STATUS: 1 - Symptomatic but completely ambulatory  Physical Exam  Constitutional: She is oriented to person, place, and time and well-developed, well-nourished, and in no distress. Vital signs are normal. She appears not lethargic, not malnourished and not jaundiced. She appears  not cachectic.  Non-toxic appearance. She does not have a sickly appearance. No distress.  Morbidly obese female  HENT:  Head: Normocephalic and atraumatic.  Mouth/Throat: She does not have dentures. Normal dentition. No oropharyngeal exudate.    Eyes: Pupils are equal, round, and reactive to light. Conjunctivae and EOM are normal. Right eye exhibits no chemosis and no exudate. Left eye exhibits no chemosis and no exudate. Right conjunctiva has no hemorrhage. Left conjunctiva has no hemorrhage. No scleral icterus.  Neck: No JVD present. No thyroid mass and no thyromegaly present.  Cardiovascular: Normal rate, regular rhythm, S1 normal and S2 normal.  Exam reveals no gallop.   No murmur heard. Pulmonary/Chest: Effort normal.  She has no decreased breath sounds. She has no wheezes.  Coarse breath sounds diffusely with bilateral lungs. No crackles.  Abdominal: Soft. Normal appearance. There is no tenderness. There is no CVA tenderness.  Extensive pannus precluding accurate examination of the liver and spleen. No hepatosplenomegaly by percussion.  Lymphadenopathy:       Head (right side): No submandibular and no occipital adenopathy present.       Head (left side): No submandibular and no occipital adenopathy present.    She has no cervical adenopathy.    She has no axillary adenopathy.       Right: No inguinal and no supraclavicular adenopathy present.       Left: No inguinal and no supraclavicular adenopathy present.  Neurological: She is oriented to person, place, and time. She appears not lethargic.  Nonfocal neurological examination with apparent preserved and symmetric strength and sensation.  Skin: She is not diaphoretic.  No petechiae, ecchymosis, or rash. No jaundice.     LABORATORY DATA: I have personally reviewed the data as listed: No visits with results within 1 Week(s) from this visit.  Latest known visit with results is:  Hospital Outpatient Visit on 12/02/2016  Component  Date Value Ref Range Status  . WBC 12/02/2016 1.4* 4.0 - 10.5 K/uL Final   Comment: REPEATED TO VERIFY CRITICAL RESULT CALLED TO, READ BACK BY AND VERIFIED WITH: GAINES,J. RN _0  ON 7.25.18 BY NMCCOY   . RBC 12/02/2016 3.94  3.87 - 5.11 MIL/uL Final  . Hemoglobin 12/02/2016 11.5* 12.0 - 15.0 g/dL Final  . HCT 12/02/2016 32.4* 36.0 - 46.0 % Final  . MCV 12/02/2016 82.2  78.0 - 100.0 fL Final  . MCH 12/02/2016 29.2  26.0 - 34.0 pg Final  . MCHC 12/02/2016 35.5  30.0 - 36.0 g/dL Final  . RDW 12/02/2016 13.7  11.5 - 15.5 % Final  . Platelets 12/02/2016 66* 150 - 400 K/uL Final   Comment: SPECIMEN CHECKED FOR CLOTS PLATELET COUNT CONFIRMED BY SMEAR REPEATED TO VERIFY   . Neutrophils Relative % 12/02/2016 48  % Final  . Lymphocytes Relative 12/02/2016 32  % Final  . Monocytes Relative 12/02/2016 15  % Final  . Eosinophils Relative 12/02/2016 4  % Final  . Basophils Relative 12/02/2016 1  % Final  . Neutro Abs 12/02/2016 0.7* 1.7 - 7.7 K/uL Final  . Lymphs Abs 12/02/2016 0.4* 0.7 - 4.0 K/uL Final  . Monocytes Absolute 12/02/2016 0.2  0.1 - 1.0 K/uL Final  . Eosinophils Absolute 12/02/2016 0.1  0.0 - 0.7 K/uL Final  . Basophils Absolute 12/02/2016 0.0  0.0 - 0.1 K/uL Final  . WBC Morphology 12/02/2016 WHITE COUNT CONFIRMED ON SMEAR   Final  . Prothrombin Time 12/02/2016 15.5* 11.4 - 15.2 seconds Final  . INR 12/02/2016 1.23   Final       Ardath Sax, MD

## 2016-12-21 LAB — CHROMOSOME ANALYSIS, BONE MARROW

## 2017-01-07 ENCOUNTER — Ambulatory Visit (INDEPENDENT_AMBULATORY_CARE_PROVIDER_SITE_OTHER): Payer: Medicaid Other | Admitting: Pulmonary Disease

## 2017-01-07 ENCOUNTER — Encounter: Payer: Self-pay | Admitting: Pulmonary Disease

## 2017-01-07 VITALS — BP 130/72 | HR 91 | Ht 66.0 in | Wt 254.2 lb

## 2017-01-07 DIAGNOSIS — R0602 Shortness of breath: Secondary | ICD-10-CM | POA: Diagnosis not present

## 2017-01-07 DIAGNOSIS — J4530 Mild persistent asthma, uncomplicated: Secondary | ICD-10-CM

## 2017-01-07 DIAGNOSIS — R05 Cough: Secondary | ICD-10-CM | POA: Diagnosis not present

## 2017-01-07 DIAGNOSIS — R059 Cough, unspecified: Secondary | ICD-10-CM

## 2017-01-07 LAB — NITRIC OXIDE: NITRIC OXIDE: 6

## 2017-01-07 MED ORDER — BUDESONIDE-FORMOTEROL FUMARATE 80-4.5 MCG/ACT IN AERO: 2.0000 | INHALATION_SPRAY | Freq: Two times a day (BID) | RESPIRATORY_TRACT | 6 refills | Status: DC

## 2017-01-07 NOTE — Progress Notes (Signed)
Holly Mueller    [id number]    1965-08-25  Primary Care Physician:Timberlake, Curt Bears, MD  Referring Physician: [name], MD [address] [place], [place] [address]  Chief complaint:  Follow up for asthma  HPI: Mrs. Bachicha is a 51 year old with past history of asthma. She has poor control over the past year and needed to go on several tapers of prednisone. She was seen in the ED in July 2017 for an exacerbation. She is currently maintained on Symbicort and albuterol. She needs to use her albuterol every day. She denies any nighttime awakenings. She also has significant issues with sinusitis, postnasal drip with constant clearing of the throat. She has heartburn and is on Prilosec.  Interim History: Seen in ED in July 2018 for dyspnea, cough, sinus and throat congestion. CTA did not show PE, there was mild atelectasis, GGO at base. PFTs do not show obstruction. There is restriction but no ILD on CT Today in the office she has chronic cough, no dyspnea or wheezing  She is in being evaluated for cytopenia. She's had a bone marrow biopsy which did not show any abnormality. She has a right upper quadrant ultrasound and a GI Eval Pending for Evaluation of Cirrhosis.  Outpatient Encounter Prescriptions as of 01/07/2017  Medication Sig  . albuterol (PROAIR HFA) 108 (90 Base) MCG/ACT inhaler INHALE 2 PUFFS INTO THE LUNGS EVERY 6 HOURS AS NEEDED FOR SHORTNESS OF BREATH  . albuterol (PROVENTIL) (2.5 MG/3ML) 0.083% nebulizer solution Take 3 mLs (2.5 mg total) by nebulization every 6 (six) hours as needed for wheezing or shortness of breath.  . benzonatate (TESSALON) 100 MG capsule Take 1 capsule (100 mg total) by mouth 2 (two) times daily as needed for cough.  . budesonide-formoterol (SYMBICORT) 160-4.5 MCG/ACT inhaler Inhale 2 puffs into the lungs 2 (two) times daily.  . fluticasone (FLONASE) 50 MCG/ACT nasal spray Place 2 sprays into both nostrils daily.  Marland Kitchen levothyroxine  (SYNTHROID, LEVOTHROID) 300 MCG tablet Take 1 tablet (300 mcg total) by mouth daily.  . pantoprazole (PROTONIX) 40 MG tablet Take 1 tablet (40 mg total) by mouth daily.  . [DISCONTINUED] guaiFENesin-codeine (ROBITUSSIN AC) 100-10 MG/5ML syrup Take 5 mLs by mouth 3 (three) times daily as needed for cough.  . [DISCONTINUED] predniSONE (DELTASONE) 20 MG tablet 2 tabs po daily x 4 days   No facility-administered encounter medications on file as of 01/07/2017.     Allergies as of 01/07/2017 - Review Complete 01/07/2017  Allergen Reaction Noted  . Aspirin Nausea Only 03/29/2012    Past Medical History:  Diagnosis Date  . Arthritis    bursitis left hip flares-not an issue  . Asthma 2004  . Edentulous    10-19-13 at present  . Epilepsy ([place]) in 1972   No seizures since 1972. Previously treated with phenobarbital.   . GERD (gastroesophageal reflux disease) 2008  . Headache(784.0)    hx of migraines   . Hypothyroidism 2008  . Lower esophageal ring 08/18/2013  . Seasonal allergies 2003  . Shortness of breath     Past Surgical History:  Procedure Laterality Date  . BALLOON DILATION N/A 10/24/2013   Procedure: BALLOON DILATION;  Surgeon: Gatha Mayer, MD;  [facility];  Service: Endoscopy;  Laterality: N/A;  . CESAREAN SECTION     x2  . DENTAL SURGERY     multiple extractions 3'15  . ESOPHAGOGASTRODUODENOSCOPY N/A 10/24/2013   Procedure: ESOPHAGOGASTRODUODENOSCOPY (EGD);  Surgeon: Glendell Docker  Simonne Maffucci, MD;  Location: [facility];  Service: Endoscopy;  Laterality: N/A;  . LIVER BIOPSY  06/30/2012   Procedure: LIVER BIOPSY;  Surgeon: Shann Medal, MD;  [facility];  Service: General;;  . NOVASURE ABLATION    . SHOULDER ARTHROSCOPY Right 2011  . UMBILICAL HERNIA REPAIR N/A 06/30/2012   Procedure: remove umbilicus;  Surgeon: Shann Medal, MD;  [facility];  Service: General;  Laterality: N/A;  . VENTRAL HERNIA REPAIR N/A 06/30/2012   Procedure: LAPAROSCOPIC VENTRAL  HERNIA;  Surgeon: Shann Medal, MD;  [facility];  Service: General;  Laterality: N/A;  With Mesh  . WISDOM TOOTH EXTRACTION      Family History  Problem Relation Age of Onset  . Hypertension Mother   . Diabetes Mother   . Lung cancer Father   . Stroke Father   . Diabetes Sister   . Hypertension Sister     Social History   Social History  . Marital status: Single    Spouse name: N/A  . Number of children: 2  . Years of education: N/A   Occupational History  .  [employer]   Social History Main Topics  . Smoking status: Former Smoker    Packs/day: 0.25    Types: Cigarettes    Quit date: 54  . Smokeless tobacco: Never Used  . Alcohol use 0.0 oz/week     Comment: occ  . Drug use: No  . Sexual activity: No   Other Topics Concern  . Not on file   Social History Narrative   Works in Estate manager/land agent at [employer].    Lives with 12 and 14 (boys).    Also living with close friends of the family (mother and two kids, adults).           Review of systems: Review of Systems  Constitutional: Negative for fever and chills.  HENT: Negative.   Eyes: Negative for blurred vision.  Respiratory: as per HPI  Cardiovascular: Negative for chest pain and palpitations.  Gastrointestinal: Negative for vomiting, diarrhea, blood per rectum. Genitourinary: Negative for dysuria, urgency, frequency and hematuria.  Musculoskeletal: Negative for myalgias, back pain and joint pain.  Skin: Negative for itching and rash.  Neurological: Negative for dizziness, tremors, focal weakness, seizures and loss of consciousness.  Endo/Heme/Allergies: Negative for environmental allergies.  Psychiatric/Behavioral: Negative for depression, suicidal ideas and hallucinations.  All other systems reviewed and are negative.  Physical Exam: Blood pressure 130/72, pulse 91, height 5' 6" (1.676 m), weight 254 lb 3.2 oz (115.3 kg), SpO2 94 %. Gen:      No acute distress HEENT:  EOMI, sclera  anicteric Neck:     No masses; no thyromegaly Lungs:    Clear to auscultation bilaterally; normal respiratory effort CV:         Regular rate and rhythm; no murmurs Abd:      + bowel sounds; soft, non-tender; no palpable masses, no distension Ext:    No edema; adequate peripheral perfusion Skin:      Warm and dry; no rash Neuro: alert and oriented x 3 Psych: normal mood and affect  Data Reviewed: Chest x-ray 04/05/15- bibasilar opacities. Chest x-ray 11/16/15-left basal scarring.  CTA 11/23/16- no pulmonary embolism, no interstitial lung disease, cirrhosis, abdominal adenopathy. All images reviewed.  FENO 01/07/17- 6  PFTs in/6/1 8 FVC 1.84 (49%) FEV1 1.52 (51%) F/F 82 TLC 44% Unable to complete DLCO Severe restriction. No obstruction  CBC 12/02/16- WBC  1.4, platelets 66, eosinophil 4%, absolute eosinophil count 100 Blood allergy profile 03/06/16-negative, IgE less than 2  Assessment:  Asthma Upper airway cough syndrome. My suspicion for asthma  as a major cause of symptoms is low as she does have any obstruction on PFTs and FENO was low. IgE and eos levels are normal.  Her sinusitis, postnasal drip and acid reflux are likely contributing to symptoms She will continue on chlorphentermine antihistamine and Astelin nasal spray in addition to the Flonase.  We will continue the Symbicort for now. Reduce dose to 80/4.5 and monitor symptoms  Cirrhosis of liver RUQ [place] is pending with GI referral done by hematology clinic  Plan/Recommendations: - Symbicort. Reduce dose to 80/4.5, continue albuterol - Continue chlorpheniramine, Flonase, astelin - Continue protonix  Marshell Garfinkel MD [facility] Pager [phone] 01/07/2017, 3:26 PM  CC: [name], MD

## 2017-01-07 NOTE — Patient Instructions (Addendum)
We'll reduce the Symbicort dose to dose to 80/4.5 Continue using the albuterol rescue inhaler  Follow-up in 6 months.

## 2017-01-13 ENCOUNTER — Ambulatory Visit (HOSPITAL_COMMUNITY): Payer: Medicaid Other | Attending: Hematology and Oncology

## 2017-01-13 ENCOUNTER — Other Ambulatory Visit: Payer: Medicaid Other

## 2017-01-13 ENCOUNTER — Encounter (HOSPITAL_COMMUNITY): Payer: Self-pay

## 2017-01-14 ENCOUNTER — Other Ambulatory Visit: Payer: Self-pay | Admitting: Internal Medicine

## 2017-01-15 ENCOUNTER — Ambulatory Visit: Payer: Medicaid Other | Admitting: Hematology and Oncology

## 2017-01-18 ENCOUNTER — Ambulatory Visit: Payer: Medicaid Other | Admitting: Family Medicine

## 2017-01-19 ENCOUNTER — Ambulatory Visit: Payer: Medicaid Other | Admitting: Family Medicine

## 2017-01-27 ENCOUNTER — Ambulatory Visit (INDEPENDENT_AMBULATORY_CARE_PROVIDER_SITE_OTHER): Payer: Medicaid Other | Admitting: Family Medicine

## 2017-01-27 ENCOUNTER — Encounter: Payer: Self-pay | Admitting: Family Medicine

## 2017-01-27 DIAGNOSIS — R05 Cough: Secondary | ICD-10-CM

## 2017-01-27 DIAGNOSIS — R059 Cough, unspecified: Secondary | ICD-10-CM

## 2017-01-27 MED ORDER — CETIRIZINE HCL 10 MG PO TABS: 10.0000 mg | ORAL_TABLET | Freq: Every day | ORAL | 11 refills | Status: DC

## 2017-01-27 MED ORDER — BENZONATATE 100 MG PO CAPS: 100.0000 mg | ORAL_CAPSULE | Freq: Two times a day (BID) | ORAL | 0 refills | Status: DC | PRN

## 2017-01-27 MED FILL — BENZONATATE 100 MG CAPS: 100 | 15 days supply | Qty: 30 | Fill #0

## 2017-01-27 MED FILL — ALL DAY ALLERGY 10 MG TAB: 10 | 30 days supply | Qty: 30 | Fill #0

## 2017-01-27 NOTE — Patient Instructions (Signed)
It was a pleasure to see you today! Thank you for choosing [facility] for your primary care. Holly Mueller was seen for cough.   Our plans for today were:  Start taking the Zyrtec today. You need to take this daily for several days before you will noticed decrease in cough.   You need to call Dr. Matilde Bash office (your lung doctor) to schedule an appt to discuss your cough and asthma.   There is no medical reason why you can't go to [employer] duty tomorrow.   You should return to our clinic to see Dr. Lindell Noe in 3 months for chronic medical issues.   Best,  Dr. Lindell Noe

## 2017-01-27 NOTE — Progress Notes (Signed)
   CC: cough  HPI Hasn't heard wheezing. Asthma "acting up." Frequent cough. Has SOB and chest tightness like she has a cold sensation in her chest. DOE when walking about 20 feet. Noticed this change since last week. No hx of recent URI. No fevers. No sick contacts. Rhinorrhea clear. Former smoker. Been using symbicort BID. Has been using her albuterol 4 times yesterday during the day, 2 times so far today. Used albuterol because of cough and "asthma acting up." Has been using PPI. Has been using flonase, having some nose bleeds with this. Able to speak in long sentences. Cough nonproductive.   ROS: denies CP, abd pain, changes in urination or BMs, denies rash, edema.    CC, SH/smoking status, and VS noted  Objective: BP 120/85 (BP Location: Left Arm, Patient Position: Sitting, Cuff Size: Large)   Pulse 87   Temp 98.2 F (36.8 C) (Oral)   Ht _0  (1.651 m)   Wt 250 lb 9.6 oz (113.7 kg)   SpO2 94%   BMI 41.70 kg/m  Gen: NAD, alert, cooperative, and pleasant obese female.  HEENT: NCAT, EOMI, PERRL CV: RRR, no murmur Resp: CTAB, no wheezes, non-labored Abd: SNTND, BS present, no guarding or organomegaly Ext: No edema, warm Neuro: Alert and oriented, Speech clear, No gross deficits  Assessment and plan:  Cough Patient with persistent cough likely due to upper airway cough syndrome. Has not followed up with pulm in 1 year, asked her to schedule first available. Added cetirizine for additional post nasal drip treatment as patient with frequent rhinorrhea (clear). Less likely CHF exacerbation as no clinical signs of volume overload. Unlikely pneumonia as no focal lung findings, afebrile.    No orders of the defined types were placed in this encounter.   Meds ordered this encounter  Medications  . cetirizine (ZYRTEC) 10 MG tablet    Sig: Take 1 tablet (10 mg total) by mouth daily.    Dispense:  30 tablet    Refill:  11  . benzonatate (TESSALON) 100 MG capsule    Sig: Take 1  capsule (100 mg total) by mouth 2 (two) times daily as needed for cough.    Dispense:  30 capsule    Refill:  0  . benzonatate (TESSALON) 100 MG capsule    Sig: Take 1 capsule (100 mg total) by mouth 2 (two) times daily as needed for cough.    Dispense:  30 capsule    Refill:  0    Ralene Ok, MD, PGY2 02/03/2017 9:02 AM

## 2017-02-03 NOTE — Assessment & Plan Note (Addendum)
Patient with persistent cough likely due to upper airway cough syndrome. Has not followed up with pulm in 1 year, asked her to schedule first available. Added cetirizine for additional post nasal drip treatment as patient with frequent rhinorrhea (clear). Less likely CHF exacerbation as no clinical signs of volume overload. Unlikely pneumonia as no focal lung findings, afebrile.

## 2017-03-10 ENCOUNTER — Telehealth: Payer: Self-pay

## 2017-03-10 ENCOUNTER — Other Ambulatory Visit: Payer: Self-pay | Admitting: Pulmonary Disease

## 2017-03-10 ENCOUNTER — Other Ambulatory Visit: Payer: Self-pay | Admitting: Family Medicine

## 2017-03-10 DIAGNOSIS — R059 Cough, unspecified: Secondary | ICD-10-CM

## 2017-03-10 DIAGNOSIS — R05 Cough: Secondary | ICD-10-CM

## 2017-03-10 DIAGNOSIS — K219 Gastro-esophageal reflux disease without esophagitis: Secondary | ICD-10-CM

## 2017-03-10 MED FILL — PROAIR HFA 90 MCG INHALER: 108 (90 BAS | 25 days supply | Qty: 9 | Fill #2

## 2017-03-10 MED FILL — PANTOPRAZOLE SOD DR 40 MG T: 40 | 30 days supply | Qty: 30 | Fill #2

## 2017-03-10 MED FILL — SYMBICORT 160-4.5 MCG INH: 160-4.5 | 30 days supply | Qty: 10 | Fill #2

## 2017-03-10 NOTE — Telephone Encounter (Signed)
Patient returned call, CB is [phone].

## 2017-03-10 NOTE — Telephone Encounter (Signed)
The Tessalon maybe? Will await a call back.

## 2017-03-10 NOTE — Telephone Encounter (Signed)
Patient called pharmacy and needs levothtroxine, pantoprazole and benzonate refilled because pharmacy told her that they could not refill it until it was authorized. Thanks.Ozella Almond

## 2017-03-10 NOTE — Telephone Encounter (Signed)
We do not prescribe these medications. ATC pt, no answer. Left message for pt to call back.

## 2017-03-11 MED ORDER — BENZONATATE 100 MG PO CAPS: 100.0000 mg | ORAL_CAPSULE | Freq: Two times a day (BID) | ORAL | 0 refills | Status: DC | PRN

## 2017-03-11 MED ORDER — PANTOPRAZOLE SODIUM 40 MG PO TBEC: 40.0000 mg | DELAYED_RELEASE_TABLET | Freq: Every day | ORAL | 3 refills | Status: DC

## 2017-03-11 MED ORDER — LEVOTHYROXINE SODIUM 125 MCG PO TABS: 250.0000 ug | ORAL_TABLET | Freq: Every day | ORAL | 1 refills | Status: DC

## 2017-03-11 MED FILL — LEVOTHYROXINE 125 MCG TABLE: 125 | 30 days supply | Qty: 60 | Fill #0

## 2017-03-11 MED FILL — ALBUTEROL 0.083% INHAL SOLN: (2.5 MG/3ML | 6 days supply | Qty: 75 | Fill #1

## 2017-03-11 MED FILL — BENZONATATE 100 MG CAPS: 100 | 15 days supply | Qty: 30 | Fill #0

## 2017-03-11 NOTE — Telephone Encounter (Signed)
Provided refills for patient on levothyroxine, tessalon pearles, and PPI. I am reducing her synthroid dose, as her last thyroid labs showed her dose was too high. Please call patient and let her know that they are ready. Please make her an appt to see me in 6 weeks to recheck her thyroid labs.

## 2017-03-11 NOTE — Telephone Encounter (Signed)
Spoke to and informed pt. Pt asked if she can get a refill on the tessalon. Pt states "that is the only medicine I can find that controls my cough, over the counter medication doesn't work for me any more." Please advise. Ottis Stain, CMA

## 2017-03-11 NOTE — Telephone Encounter (Signed)
Called and spoke with pt and she will need to contact her PCP to fill these medications.

## 2017-03-12 ENCOUNTER — Other Ambulatory Visit: Payer: Self-pay | Admitting: Family Medicine

## 2017-03-12 DIAGNOSIS — E039 Hypothyroidism, unspecified: Secondary | ICD-10-CM

## 2017-03-12 NOTE — Progress Notes (Signed)
Placed future order for TSH, please collect around 03/24/18.

## 2017-03-12 NOTE — Telephone Encounter (Signed)
Needs visit to discuss meds and adjust thyroid medication (I believe there was concern for running out of synthroid or not taking it in the past - TSH was quite elevated). I will place future order for TSH in 6 weeks, please make appt w anyone on white team around that time.

## 2017-03-12 NOTE — Telephone Encounter (Signed)
Please call patient to clarify, I sent tessalon pearls yesterday to pharmacy along with other meds.

## 2017-03-16 NOTE — Telephone Encounter (Signed)
LVM for pt to call office back to inform her of below and to assist her in getting an appointment scheduled in about 6 weeks per Dr. Lindell Noe.  She can see anyone on white team per Dr. Lindell Noe.Holly Mueller, April D, [place]

## 2017-03-17 NOTE — Telephone Encounter (Signed)
Appointment scheduled for this. Holly Mueller, Holly Mueller, Holly Mueller

## 2017-03-22 ENCOUNTER — Encounter (HOSPITAL_COMMUNITY): Payer: Self-pay | Admitting: Emergency Medicine

## 2017-03-22 ENCOUNTER — Emergency Department (HOSPITAL_COMMUNITY)
Admission: EM | Admit: 2017-03-22 | Discharge: 2017-03-22 | Disposition: A | Discharge status: Home / Self Care | Payer: Medicaid Other | Attending: Emergency Medicine | Admitting: Emergency Medicine

## 2017-03-22 ENCOUNTER — Other Ambulatory Visit: Payer: Self-pay

## 2017-03-22 ENCOUNTER — Emergency Department (HOSPITAL_COMMUNITY): Payer: Medicaid Other

## 2017-03-22 DIAGNOSIS — X58XXXA Exposure to other specified factors, initial encounter: Secondary | ICD-10-CM | POA: Diagnosis not present

## 2017-03-22 DIAGNOSIS — Z79899 Other long term (current) drug therapy: Secondary | ICD-10-CM | POA: Insufficient documentation

## 2017-03-22 DIAGNOSIS — Z87891 Personal history of nicotine dependence: Secondary | ICD-10-CM | POA: Diagnosis not present

## 2017-03-22 DIAGNOSIS — Y929 Unspecified place or not applicable: Secondary | ICD-10-CM | POA: Diagnosis not present

## 2017-03-22 DIAGNOSIS — J45909 Unspecified asthma, uncomplicated: Secondary | ICD-10-CM | POA: Insufficient documentation

## 2017-03-22 DIAGNOSIS — Y939 Activity, unspecified: Secondary | ICD-10-CM | POA: Diagnosis not present

## 2017-03-22 DIAGNOSIS — Y999 Unspecified external cause status: Secondary | ICD-10-CM | POA: Diagnosis not present

## 2017-03-22 DIAGNOSIS — E039 Hypothyroidism, unspecified: Secondary | ICD-10-CM | POA: Insufficient documentation

## 2017-03-22 DIAGNOSIS — S60041A Contusion of right ring finger without damage to nail, initial encounter: Secondary | ICD-10-CM | POA: Diagnosis not present

## 2017-03-22 DIAGNOSIS — S6991XA Unspecified injury of right wrist, hand and finger(s), initial encounter: Secondary | ICD-10-CM | POA: Diagnosis present

## 2017-03-22 DIAGNOSIS — M7989 Other specified soft tissue disorders: Secondary | ICD-10-CM

## 2017-03-22 MED ORDER — IBUPROFEN 400 MG PO TABS
600.0000 mg | ORAL_TABLET | Freq: Once | ORAL | Status: AC
  Administered 2017-03-22: 16:00:00 600 mg via ORAL
  Filled 2017-03-22: qty 1

## 2017-03-22 NOTE — Discharge Instructions (Signed)
Your x-ray shows no fracture, apply ice and keep finger elevated.  You may take Tylenol as needed for pain.  If finger is not improving please follow-up with your family doctor.  If you develop redness, swelling more chills or other new or concerning symptoms please return to the ED for reevaluation.

## 2017-03-22 NOTE — ED Triage Notes (Signed)
Pt here with c/o swelling to the ring finger on the right hand , pt able to move finger , finger is bruised

## 2017-03-22 NOTE — ED Provider Notes (Signed)
[facility] EMERGENCY DEPARTMENT Provider Note   CSN: [id number] Arrival date & time: 03/22/17  1519     History   Chief Complaint Chief Complaint  Patient presents with  . Finger Injury    HPI  Holly Mueller is a 51 y.o. Female history of GERD, asthma, epilepsy, presents complaining of swelling and bruising to her right ring finger.  Patient reports she was at work today and noticed some discoloration, swelling and decreased range of motion in her right ring finger, she denies any inciting injury, did not hit her finger on anything and reports her finger was completely normal this morning before she got to work.  She reports very minimal pain, primarily with movement, and purplish discoloration over the finger joints, no pain in any of the other fingers or the right hand.  No numbness or tingling.  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      Past Medical History:  Diagnosis Date  . Arthritis    bursitis left hip flares-not an issue  . Asthma 2004  . Edentulous    10-19-13 at present  . Epilepsy ([place]) in 1972   No seizures since 1972. Previously treated with phenobarbital.   . GERD (gastroesophageal reflux disease) 2008  . Headache(784.0)    hx of migraines   . Hypothyroidism 2008  . Lower esophageal ring 08/18/2013  . Seasonal allergies 2003  . Shortness of breath     Patient Active Problem List   Diagnosis Date Noted  . Cirrhosis of liver without ascites ([place]) 11/17/2016  . Thrombocytopenia ([place]) 11/17/2016  . Chronic leukopenia 11/17/2016  . Bicytopenia 10/24/2016  . Epistaxis 10/23/2016  . GERD (gastroesophageal reflux disease) 10/23/2016  . Cough 12/09/2015  . Chronic pain of left thumb 10/09/2015  . Precordial chest pain 08/03/2015  . Abscess of skin of abdomen 07/10/2014  . Bursitis of left hip 09/14/2013  . Lower esophageal ring 08/18/2013  . Dyspnea 08/17/2013    . Ganglion cyst of wrist 08/24/2012  . Hip pain, bilateral 08/23/2012  . Umbilical hernia 66/44/0347  . Healthcare maintenance 02/14/2012  . Osteoarthritis 02/12/2012  . Elevated LFTs 10/30/2011  . Allergic rhinitis 08/26/2011  . Obesity 07/08/2010  . SEIZURES, HX OF 12/12/2009  . DENTAL CARIES 12/18/2008  . DEPRESSION 07/26/2008  . MIGRAINE HEADACHE 11/15/2007  . HYPOTHYROIDISM NOS 10/12/2006  . ASTHMA, EXTRINSIC NOS 07/30/2006    Past Surgical History:  Procedure Laterality Date  . CESAREAN SECTION     x2  . DENTAL SURGERY     multiple extractions 3'15  . NOVASURE ABLATION    . SHOULDER ARTHROSCOPY Right 2011  . WISDOM TOOTH EXTRACTION      OB History    No data available       Home Medications    Prior to Admission medications   Medication Sig Start Date End Date Taking? Authorizing Provider  albuterol (PROAIR HFA) 108 (90 Base) MCG/ACT inhaler INHALE 2 PUFFS INTO THE LUNGS EVERY 6 HOURS AS NEEDED FOR SHORTNESS OF BREATH 10/23/16   Mercy Riding, MD  albuterol (PROVENTIL) (2.5 MG/3ML) 0.083% nebulizer solution Take 3 mLs (2.5 mg total) by nebulization every 6 (six) hours as needed for wheezing or shortness of breath. 10/23/16   Mercy Riding, MD  benzonatate (TESSALON) 100 MG capsule Take 1 capsule (100 mg total) by mouth 2 (two) times daily as needed  for cough. 03/11/17   [name], MD  budesonide-formoterol [facility]) 80-4.5 MCG/ACT inhaler Inhale 2 puffs into the lungs 2 (two) times daily. 01/07/17   Mannam, Hart Robinsons, MD  cetirizine (ZYRTEC) 10 MG tablet Take 1 tablet (10 mg total) by mouth daily. 01/27/17   [name], MD  fluticasone (FLONASE) 50 MCG/ACT nasal spray Place 2 sprays into both nostrils daily.    [provider]  levothyroxine (SYNTHROID, LEVOTHROID) 125 MCG tablet Take 2 tablets (250 mcg total) by mouth daily. 03/11/17   [name], MD  pantoprazole (PROTONIX) 40 MG tablet Take 1 tablet (40 mg total) by mouth daily.  03/11/17   [name], MD    Family History Family History  Problem Relation Age of Onset  . Hypertension Mother   . Diabetes Mother   . Lung cancer Father   . Stroke Father   . Diabetes Sister   . Hypertension Sister     Social History Social History   Tobacco Use  . Smoking status: Former Smoker    Packs/day: 0.25    Types: Cigarettes    Last attempt to quit: 1997    Years since quitting: 21.8  . Smokeless tobacco: Never Used  Substance Use Topics  . Alcohol use: Yes    Alcohol/week: 0.0 oz    Comment: occ  . Drug use: No     Allergies   Aspirin   Review of Systems Review of Systems  Constitutional: Negative for chills and fever.  Musculoskeletal: Positive for arthralgias (R ring finger) and joint swelling.  Neurological: Negative for weakness and numbness.     Physical Exam Updated Vital Signs BP 118/78 (BP Location: Right Arm)   Pulse 95   Temp 98 F (36.7 C) (Oral)   Resp 16   SpO2 95%   Physical Exam  Constitutional: She appears well-developed and well-nourished. No distress.  HENT:  Head: Normocephalic and atraumatic.  Eyes: Right eye exhibits no discharge. Left eye exhibits no discharge.  Pulmonary/Chest: Effort normal. No respiratory distress.  Musculoskeletal:  Bruising and slight swelling of the right ring finger, minimal tenderness to palpation, particularly over the PIP, full active range of motion of the finger, normal flexion and extension against resistance at MCP, PIP and DIP, sensation intact, good capillary refill, no subungual hematoma No tenderness in the hand or other fingers, normal grip and wrist range of motion  Neurological: She is alert. Coordination normal.  Skin: Skin is warm and dry. Capillary refill takes less than 2 seconds. She is not diaphoretic.  Psychiatric: She has a normal mood and affect. Her behavior is normal.  Nursing note and vitals reviewed.      ED Treatments / Results  Labs (all labs ordered  are listed, but only abnormal results are displayed) Labs Reviewed - No data to display  EKG  EKG Interpretation None       Radiology Dg Finger Ring Right  Result Date: 03/22/2017 CLINICAL DATA:  Right fourth digit pain and bruising for 1 day, no known injury, initial encounter EXAM: RIGHT RING FINGER 2+V COMPARISON:  None. FINDINGS: There is no evidence of fracture or dislocation. There is no evidence of arthropathy or other focal bone abnormality. Soft tissues are unremarkable. IMPRESSION: No acute abnormality noted. Electronically Signed   By: Inez Catalina M.D.   On: 03/22/2017 16:30    Procedures Procedures (including critical care time)  Medications Ordered in ED Medications  ibuprofen (ADVIL,MOTRIN) tablet 600 mg (600 mg Oral Given 03/22/17 1550)  Initial Impression / Assessment and Plan / ED Course  I have reviewed the triage vital signs and the nursing notes.  Pertinent labs & imaging results that were available during my care of the patient were reviewed by me and considered in my medical decision making (see chart for details).  Patient presents with swelling and bruising of the right ring finger, minimal tenderness, full range of motion against resistance at MCP, PIP and DIP, neurovascularly intact.  Patient has not on any blood thinners x-ray shows no evidence of fracture or dislocation.  Patient is stable for discharge home, counseled patient on ice and elevation, Tylenol for pain. Pt to follow-up with family doctor if not improving, return precautions discussed.  Final Clinical Impressions(s) / ED Diagnoses   Final diagnoses:  Finger swelling  Contusion of right ring finger without damage to nail, initial encounter    ED Discharge Orders    None       Janet Berlin 03/22/17 1705    Sherwood Gambler, MD 03/22/17 2115

## 2017-04-05 ENCOUNTER — Encounter (HOSPITAL_COMMUNITY): Payer: Self-pay

## 2017-04-05 ENCOUNTER — Emergency Department (HOSPITAL_COMMUNITY)
Admission: EM | Admit: 2017-04-05 | Discharge: 2017-04-05 | Disposition: A | Discharge status: Home / Self Care | Payer: Medicaid Other | Attending: Emergency Medicine | Admitting: Emergency Medicine

## 2017-04-05 ENCOUNTER — Emergency Department (HOSPITAL_COMMUNITY): Payer: Medicaid Other

## 2017-04-05 ENCOUNTER — Other Ambulatory Visit: Payer: Self-pay

## 2017-04-05 DIAGNOSIS — R059 Cough, unspecified: Secondary | ICD-10-CM

## 2017-04-05 DIAGNOSIS — Z87891 Personal history of nicotine dependence: Secondary | ICD-10-CM | POA: Diagnosis not present

## 2017-04-05 DIAGNOSIS — J4521 Mild intermittent asthma with (acute) exacerbation: Secondary | ICD-10-CM | POA: Diagnosis not present

## 2017-04-05 DIAGNOSIS — R0602 Shortness of breath: Secondary | ICD-10-CM | POA: Diagnosis present

## 2017-04-05 DIAGNOSIS — R05 Cough: Secondary | ICD-10-CM | POA: Insufficient documentation

## 2017-04-05 DIAGNOSIS — Z79899 Other long term (current) drug therapy: Secondary | ICD-10-CM | POA: Insufficient documentation

## 2017-04-05 DIAGNOSIS — E039 Hypothyroidism, unspecified: Secondary | ICD-10-CM | POA: Diagnosis not present

## 2017-04-05 LAB — BASIC METABOLIC PANEL
Anion gap: 6 (ref 5–15)
BUN: 6 mg/dL (ref 6–20)
CHLORIDE: 105 mmol/L (ref 101–111)
CO2: 23 mmol/L (ref 22–32)
CREATININE: 0.7 mg/dL (ref 0.44–1.00)
Calcium: 8.8 mg/dL — ABNORMAL LOW (ref 8.9–10.3)
GFR calc non Af Amer: >60 mL/min (ref >60)
Glucose, Bld: 220 mg/dL — ABNORMAL HIGH (ref 65–99)
POTASSIUM: 4.1 mmol/L (ref 3.5–5.1)
SODIUM: 134 mmol/L — AB (ref 135–145)

## 2017-04-05 LAB — CBC
HEMATOCRIT: 35.4 % — AB (ref 36.0–46.0)
HEMOGLOBIN: 12.1 g/dL (ref 12.0–15.0)
MCH: 29.2 pg (ref 26.0–34.0)
MCHC: 34.2 g/dL (ref 30.0–36.0)
MCV: 85.5 fL (ref 78.0–100.0)
Platelets: 71 10*3/uL — ABNORMAL LOW (ref 150–400)
RBC: 4.14 MIL/uL (ref 3.87–5.11)
RDW: 14.6 % (ref 11.5–15.5)
WBC: 1.6 10*3/uL — AB (ref 4.0–10.5)

## 2017-04-05 MED ORDER — HYDROCODONE-ACETAMINOPHEN 7.5-325 MG/15ML PO SOLN
10.0000 mL | Freq: Once | ORAL | Status: AC
  Administered 2017-04-05: 10 mL via ORAL
  Filled 2017-04-05: qty 15

## 2017-04-05 MED ORDER — ALBUTEROL SULFATE (2.5 MG/3ML) 0.083% IN NEBU
2.5000 mg | INHALATION_SOLUTION | Freq: Once | RESPIRATORY_TRACT | Status: AC
  Administered 2017-04-05: 2.5 mg via RESPIRATORY_TRACT
  Filled 2017-04-05: qty 3

## 2017-04-05 MED ORDER — PREDNISONE 20 MG PO TABS
60.0000 mg | ORAL_TABLET | Freq: Once | ORAL | Status: AC
  Administered 2017-04-05: 60 mg via ORAL
  Filled 2017-04-05: qty 3

## 2017-04-05 MED ORDER — PREDNISONE 20 MG PO TABS: 20.0000 mg | ORAL_TABLET | Freq: Two times a day (BID) | ORAL | 0 refills | Status: DC

## 2017-04-05 MED ORDER — BUDESONIDE-FORMOTEROL FUMARATE 160-4.5 MCG/ACT IN AERO: 2.0000 | INHALATION_SPRAY | Freq: Two times a day (BID) | RESPIRATORY_TRACT | 12 refills | Status: DC

## 2017-04-05 MED ORDER — PROMETHAZINE-DM 6.25-15 MG/5ML PO SYRP: 5.0000 mL | ORAL_SOLUTION | Freq: Four times a day (QID) | ORAL | 0 refills | Status: DC | PRN

## 2017-04-05 NOTE — ED Triage Notes (Signed)
Pt presents for evaluation of sob and coughing x 1 week. Reports generalized weakness/fatigue. States not sleeping well.

## 2017-04-05 NOTE — ED Notes (Signed)
Dr Jeneen Rinks at bedside. Pt endorses productive cough with yellow sputum x 1 week. Pt has hx of asthma and has been using her nebulizers and inhaler at home with temporary relief. Pt observed to be coughing here. Breath sounds clear.

## 2017-04-05 NOTE — ED Notes (Signed)
Pt finishing neb treatment prior to departure

## 2017-04-05 NOTE — ED Provider Notes (Signed)
[facility] EMERGENCY DEPARTMENT Provider Note   CSN: [id number] Arrival date & time: 04/05/17  1428     History   Chief Complaint Chief Complaint  Patient presents with  . Shortness of Breath    HPI Holly Mueller is a 51 y.o. female. She complaint is "I'm coughing again".  HPI: 51 year old female. History of chronic cough. History of cirrhosis. Reactive airways disease/asthma. Is been coughing for the last week. Occasionally productive yellow sputum. No shortness of breath. No fever. States just frustrated because she can't sleep at night. Is followed with pulmonary in the past. Has chronic upper respiratory cough syndrome. Still using Symbicort. Use albuterol. His been using her albuterol today.  Past Medical History:  Diagnosis Date  . Arthritis    bursitis left hip flares-not an issue  . Asthma 2004  . Edentulous    10-19-13 at present  . Epilepsy ([place]) in 1972   No seizures since 1972. Previously treated with phenobarbital.   . GERD (gastroesophageal reflux disease) 2008  . Headache(784.0)    hx of migraines   . Hypothyroidism 2008  . Lower esophageal ring 08/18/2013  . Seasonal allergies 2003  . Shortness of breath     Patient Active Problem List   Diagnosis Date Noted  . Cirrhosis of liver without ascites ([place]) 11/17/2016  . Thrombocytopenia ([place]) 11/17/2016  . Chronic leukopenia 11/17/2016  . Bicytopenia 10/24/2016  . Epistaxis 10/23/2016  . GERD (gastroesophageal reflux disease) 10/23/2016  . Cough 12/09/2015  . Chronic pain of left thumb 10/09/2015  . Precordial chest pain 08/03/2015  . Abscess of skin of abdomen 07/10/2014  . Bursitis of left hip 09/14/2013  . Lower esophageal ring 08/18/2013  . Dyspnea 08/17/2013  . Ganglion cyst of wrist 08/24/2012  . Hip pain, bilateral 08/23/2012  . Umbilical hernia 73/71/0626  . Healthcare maintenance 02/14/2012  . Osteoarthritis 02/12/2012  . Elevated LFTs 10/30/2011  . Allergic rhinitis  08/26/2011  . Obesity 07/08/2010  . SEIZURES, HX OF 12/12/2009  . DENTAL CARIES 12/18/2008  . DEPRESSION 07/26/2008  . MIGRAINE HEADACHE 11/15/2007  . HYPOTHYROIDISM NOS 10/12/2006  . ASTHMA, EXTRINSIC NOS 07/30/2006    Past Surgical History:  Procedure Laterality Date  . BALLOON DILATION N/A 10/24/2013   Procedure: BALLOON DILATION;  Surgeon: Gatha Mayer, MD;  Location: [facility] ENDOSCOPY;  Service: Endoscopy;  Laterality: N/A;  . CESAREAN SECTION     x2  . DENTAL SURGERY     multiple extractions 3'15  . ESOPHAGOGASTRODUODENOSCOPY N/A 10/24/2013   Procedure: ESOPHAGOGASTRODUODENOSCOPY (EGD);  Surgeon: Gatha Mayer, MD;  Location: [facility];  Service: Endoscopy;  Laterality: N/A;  . LIVER BIOPSY  06/30/2012   Procedure: LIVER BIOPSY;  Surgeon: Shann Medal, MD;  Location: [facility] ORS;  Service: General;;  . NOVASURE ABLATION    . SHOULDER ARTHROSCOPY Right 2011  . UMBILICAL HERNIA REPAIR N/A 06/30/2012   Procedure: remove umbilicus;  Surgeon: Shann Medal, MD;  Location: [facility] ORS;  Service: General;  Laterality: N/A;  . VENTRAL HERNIA REPAIR N/A 06/30/2012   Procedure: LAPAROSCOPIC VENTRAL HERNIA;  Surgeon: Shann Medal, MD;  Location: [facility] ORS;  Service: General;  Laterality: N/A;  With Mesh  . WISDOM TOOTH EXTRACTION      OB History    No data available       Home Medications    Prior to Admission medications   Medication Sig Start Date End Date Taking? Authorizing Provider  albuterol [facility] HFA) 108 (90 Base)  MCG/ACT inhaler INHALE 2 PUFFS INTO THE LUNGS EVERY 6 HOURS AS NEEDED FOR SHORTNESS OF BREATH 10/23/16   Mercy Riding, MD  albuterol (PROVENTIL) (2.5 MG/3ML) 0.083% nebulizer solution Take 3 mLs (2.5 mg total) by nebulization every 6 (six) hours as needed for wheezing or shortness of breath. 10/23/16   Mercy Riding, MD  benzonatate (TESSALON) 100 MG capsule Take 1 capsule (100 mg total) by mouth 2 (two) times daily as needed for cough. 03/11/17   [name], MD    budesonide-formoterol [facility]) 160-4.5 MCG/ACT inhaler Inhale 2 puffs into the lungs 2 (two) times daily. 04/05/17   Tanna Furry, MD  cetirizine (ZYRTEC) 10 MG tablet Take 1 tablet (10 mg total) by mouth daily. 01/27/17   [name], MD  fluticasone (FLONASE) 50 MCG/ACT nasal spray Place 2 sprays into both nostrils daily.    [provider]  levothyroxine (SYNTHROID, LEVOTHROID) 125 MCG tablet Take 2 tablets (250 mcg total) by mouth daily. 03/11/17   [name], MD  pantoprazole (PROTONIX) 40 MG tablet Take 1 tablet (40 mg total) by mouth daily. 03/11/17   [name], MD  predniSONE (DELTASONE) 20 MG tablet Take 1 tablet (20 mg total) by mouth 2 (two) times daily with a meal. 04/05/17   Tanna Furry, MD  promethazine-dextromethorphan (PROMETHAZINE-DM) 6.25-15 MG/5ML syrup Take 5 mLs by mouth 4 (four) times daily as needed for cough. 04/05/17   Tanna Furry, MD    Family History Family History  Problem Relation Age of Onset  . Hypertension Mother   . Diabetes Mother   . Lung cancer Father   . Stroke Father   . Diabetes Sister   . Hypertension Sister     Social History Social History   Tobacco Use  . Smoking status: Former Smoker    Packs/day: 0.25    Types: Cigarettes    Last attempt to quit: 1997    Years since quitting: 21.9  . Smokeless tobacco: Never Used  Substance Use Topics  . Alcohol use: Yes    Alcohol/week: 0.0 oz    Comment: occ  . Drug use: No     Allergies   Aspirin   Review of Systems Review of Systems  Constitutional: Negative for appetite change, chills, diaphoresis, fatigue and fever.  HENT: Positive for congestion, postnasal drip and rhinorrhea. Negative for mouth sores, sore throat and trouble swallowing.   Eyes: Negative for visual disturbance.  Respiratory: Positive for cough. Negative for chest tightness, shortness of breath and wheezing.   Cardiovascular: Negative for chest pain.  Gastrointestinal: Negative for  abdominal distention, abdominal pain, diarrhea, nausea and vomiting.  Endocrine: Negative for polydipsia, polyphagia and polyuria.  Genitourinary: Negative for dysuria, frequency and hematuria.  Musculoskeletal: Negative for gait problem.  Skin: Negative for color change, pallor and rash.  Neurological: Negative for dizziness, syncope, light-headedness and headaches.  Hematological: Does not bruise/bleed easily.  Psychiatric/Behavioral: Negative for behavioral problems and confusion.     Physical Exam Updated Vital Signs BP 137/85 (BP Location: Left Arm)   Pulse 88   Temp 98.3 F (36.8 C) (Oral)   Resp 16   SpO2 98%   Physical Exam  Constitutional: She is oriented to person, place, and time. She appears well-developed and well-nourished. No distress.  HENT:  Head: Normocephalic.  Eyes: Conjunctivae are normal. Pupils are equal, round, and reactive to light. No scleral icterus.  Neck: Normal range of motion. Neck supple. No thyromegaly present.  Cardiovascular: Normal rate and regular rhythm. Exam  reveals no gallop and no friction rub.  No murmur heard. Pulmonary/Chest: Effort normal and breath sounds normal. No respiratory distress. She has no wheezes. She has no rales.  Slight index recurrent wheezing. Clears without treat  Abdominal: Soft. Bowel sounds are normal. She exhibits no distension. There is no tenderness. There is no rebound.  Musculoskeletal: Normal range of motion.  Neurological: She is alert and oriented to person, place, and time.  Skin: Skin is warm and dry. No rash noted.  Psychiatric: She has a normal mood and affect. Her behavior is normal.     ED Treatments / Results  Labs (all labs ordered are listed, but only abnormal results are displayed) Labs Reviewed  BASIC METABOLIC PANEL - Abnormal; Notable for the following components:      Result Value   Sodium 134 (*)    Glucose, Bld 220 (*)    Calcium 8.8 (*)    All other components within normal limits    CBC - Abnormal; Notable for the following components:   WBC 1.6 (*)    HCT 35.4 (*)    Platelets 71 (*)    All other components within normal limits    EKG  EKG Interpretation None       Radiology Dg Chest 2 View  Result Date: 04/05/2017 CLINICAL DATA:  Initial evaluation for productive cough and chest pain for 1 week. EXAM: CHEST  2 VIEW COMPARISON:  Prior radiograph from 11/23/2016. FINDINGS: Cardiac mediastinal silhouettes are stable in size and contour, and remain within normal limits. Lungs are hypoinflated. Chronic interstitial thickening, greatest within the left lung base, stable from previous. No consolidative airspace disease. No pulmonary edema or pleural effusion. No pneumothorax. No acute osseus abnormality. IMPRESSION: 1. Low lung volumes with chronic interstitial thickening, stable from previous. 2. No other active cardiopulmonary disease. Electronically Signed   By: Jeannine Boga M.D.   On: 04/05/2017 15:21    Procedures Procedures (including critical care time)  Medications Ordered in ED Medications  albuterol (PROVENTIL) (2.5 MG/3ML) 0.083% nebulizer solution 2.5 mg (2.5 mg Nebulization Given 04/05/17 2054)  predniSONE (DELTASONE) tablet 60 mg (60 mg Oral Given 04/05/17 2054)  HYDROcodone-acetaminophen (HYCET) 7.5-325 mg/15 ml solution 10 mL (10 mLs Oral Given 04/05/17 2054)     Initial Impression / Assessment and Plan / ED Course  I have reviewed the triage vital signs and the nursing notes.  Pertinent labs & imaging results that were available during my care of the patient were reviewed by me and considered in my medical decision making (see chart for details).  Clinical Course as of Apr 05 2100  Mon Apr 05, 2017  2032 DG Chest 2 View [MJ]    Clinical Course User Index [MJ] Tanna Furry, MD   Given albuterol and prednisone here. Plan home, albuterol, prednisone, Phenergan DM.  Final Clinical Impressions(s) / ED Diagnoses   Final diagnoses:   Cough  Mild intermittent asthma with exacerbation    ED Discharge Orders        Ordered    predniSONE (DELTASONE) 20 MG tablet  2 times daily with meals     04/05/17 2058    budesonide-formoterol (SYMBICORT) 160-4.5 MCG/ACT inhaler  2 times daily     04/05/17 2058    promethazine-dextromethorphan (PROMETHAZINE-DM) 6.25-15 MG/5ML syrup  4 times daily PRN     04/05/17 2058       Tanna Furry, MD 04/05/17 2101

## 2017-04-05 NOTE — ED Notes (Signed)
Pt stable, ambulatory, states understanding of discharge instructions 

## 2017-04-07 MED FILL — predniSONE 20 MG TABS: 20 | 5 days supply | Qty: 10 | Fill #0

## 2017-04-07 MED FILL — PROMETHAZINE-DM SYRUP: 6.25-15 | 6 days supply | Qty: 120 | Fill #0

## 2017-04-07 MED FILL — SYMBICORT 160-4.5 MCG INH: 160-4.5 | 30 days supply | Qty: 10 | Fill #0

## 2017-04-27 ENCOUNTER — Other Ambulatory Visit: Payer: Self-pay

## 2017-04-27 ENCOUNTER — Encounter: Payer: Self-pay | Admitting: Student in an Organized Health Care Education/Training Program

## 2017-04-27 ENCOUNTER — Ambulatory Visit: Payer: Medicaid Other | Admitting: Student in an Organized Health Care Education/Training Program

## 2017-04-27 VITALS — BP 100/68 | HR 89 | Temp 98.4°F | Ht 65.0 in | Wt 247.4 lb

## 2017-04-27 DIAGNOSIS — J069 Acute upper respiratory infection, unspecified: Secondary | ICD-10-CM | POA: Diagnosis present

## 2017-04-27 DIAGNOSIS — R05 Cough: Secondary | ICD-10-CM | POA: Diagnosis not present

## 2017-04-27 DIAGNOSIS — R059 Cough, unspecified: Secondary | ICD-10-CM

## 2017-04-27 NOTE — Progress Notes (Signed)
   CC: cough  HPI: Holly Mueller is a 51 y.o. female with PMH significant for asthma, allergic rhinitis, GERD and chronic cough who presents to [facility] today with cough of one weeks duration.    Acute Cough Patient was seen in ED and was given prednisone, symbicort and promethazine-DM. Symptoms persisted so she came into the office today.  Last week developed cough which makes it difficult to sleep. Coughing up mucous. Reports she has not had any fevers. She does endorse some sore throat. Has been having rhinorrhea, or congestion. Denies swelling in her legs. Dose not think she has had any wheezing. Asthma noted on problem list. She has been using albuterol 3x per day for the first 3 days of the week. Has used albuterol once today. Used it twice yesterday.  Review of Symptoms:  See HPI for ROS.   CC, SH/smoking status, and VS noted.  Objective: BP 100/68   Pulse 89   Temp 98.4 F (36.9 C) (Oral)   Ht _0  (1.651 m)   Wt 247 lb 6.4 oz (112.2 kg)   SpO2 95%   BMI 41.17 kg/m  GEN: NAD, alert, cooperative, and pleasant. ENMT: normal tympanic light reflex, no nasal polyps,no rhinorrhea, no pharyngeal erythema or exudates NECK: full ROM, no thyromegaly RESPIRATORY: clear to auscultation bilaterally with no wheezes, rhonchi or rales, good effort, comfortable work of breathing, +coughing CV: RRR, no m/r/g, no peripheral edema GI: soft, non-tender, non-distended, no hepatosplenomegaly  Assessment and plan:  Cough Patient has upper respiratory illness, likely viral, with persistent cough. She has been using promethazine-DM and symbicort. She is s/p a course of prednisone.  In the office she has no wheezing. Cough is noted. - Continue supportive  Care, patient can use OTC remedies such as mucinex to help with symptoms - expect post-viral cough to last up to 4-6 weeks - discussed reasons to return to care - follow up as needed  Everrett Coombe, MD,MS,  PGY2 05/02/2017 7:21 AM

## 2017-04-27 NOTE — Patient Instructions (Addendum)
It was a pleasure seeing you today in our clinic. Today we discussed your viral illness. Here is the treatment plan we have discussed and agreed upon together:  - Please continue to stay hydrated and use over the counter cough medications and mucinex to help control your symptoms -The cough from these viral illnesses unfortunately can last 4-6 weeks.   - If you develop high fevers that last multiple days or develop difficulty breathing, these may be signs you need to be reevaluated and we would want you to come back into clinic.  Our clinic's number is [phone]. Please call with questions or concerns about what we discussed today.  Be well, Dr. Burr Medico

## 2017-05-02 ENCOUNTER — Encounter: Payer: Self-pay | Admitting: Student in an Organized Health Care Education/Training Program

## 2017-05-02 NOTE — Assessment & Plan Note (Signed)
Patient has upper respiratory illness, likely viral, with persistent cough. She has been using promethazine-DM and symbicort. She is s/p a course of prednisone.  In the office she has no wheezing. Cough is noted. - Continue supportive  Care, patient can use OTC remedies such as mucinex to help with symptoms - expect post-viral cough to last up to 4-6 weeks - discussed reasons to return to care - follow up as needed

## 2017-05-17 MED FILL — PANTOPRAZOLE SOD DR 40 MG T: 40 | 30 days supply | Qty: 30 | Fill #3

## 2017-05-17 MED FILL — ALL DAY ALLERGY 10 MG TAB: 10 | 30 days supply | Qty: 30 | Fill #1

## 2017-05-17 MED FILL — LEVOTHYROXINE 125 MCG TAB: 125 | 30 days supply | Qty: 60 | Fill #1

## 2017-06-14 ENCOUNTER — Encounter (HOSPITAL_COMMUNITY): Payer: Self-pay | Admitting: *Deleted

## 2017-06-14 ENCOUNTER — Inpatient Hospital Stay (HOSPITAL_COMMUNITY)
Admission: EM | Admit: 2017-06-14 | Discharge: 2017-06-19 | DRG: 872 | Disposition: A | Discharge status: Home / Self Care | Payer: Medicaid Other | Attending: Family Medicine | Admitting: Family Medicine

## 2017-06-14 ENCOUNTER — Emergency Department (HOSPITAL_COMMUNITY): Payer: Medicaid Other

## 2017-06-14 ENCOUNTER — Other Ambulatory Visit: Payer: Self-pay

## 2017-06-14 DIAGNOSIS — K7460 Unspecified cirrhosis of liver: Secondary | ICD-10-CM | POA: Diagnosis present

## 2017-06-14 DIAGNOSIS — J4520 Mild intermittent asthma, uncomplicated: Secondary | ICD-10-CM | POA: Diagnosis not present

## 2017-06-14 DIAGNOSIS — R69 Illness, unspecified: Secondary | ICD-10-CM | POA: Diagnosis not present

## 2017-06-14 DIAGNOSIS — Z87891 Personal history of nicotine dependence: Secondary | ICD-10-CM

## 2017-06-14 DIAGNOSIS — Z6839 Body mass index (BMI) 39.0-39.9, adult: Secondary | ICD-10-CM | POA: Diagnosis not present

## 2017-06-14 DIAGNOSIS — R0989 Other specified symptoms and signs involving the circulatory and respiratory systems: Secondary | ICD-10-CM

## 2017-06-14 DIAGNOSIS — K219 Gastro-esophageal reflux disease without esophagitis: Secondary | ICD-10-CM | POA: Diagnosis present

## 2017-06-14 DIAGNOSIS — R5081 Fever presenting with conditions classified elsewhere: Secondary | ICD-10-CM | POA: Diagnosis present

## 2017-06-14 DIAGNOSIS — G40909 Epilepsy, unspecified, not intractable, without status epilepticus: Secondary | ICD-10-CM | POA: Diagnosis present

## 2017-06-14 DIAGNOSIS — R162 Hepatomegaly with splenomegaly, not elsewhere classified: Secondary | ICD-10-CM | POA: Diagnosis present

## 2017-06-14 DIAGNOSIS — D72819 Decreased white blood cell count, unspecified: Secondary | ICD-10-CM | POA: Diagnosis not present

## 2017-06-14 DIAGNOSIS — J101 Influenza due to other identified influenza virus with other respiratory manifestations: Secondary | ICD-10-CM | POA: Diagnosis present

## 2017-06-14 DIAGNOSIS — E6609 Other obesity due to excess calories: Secondary | ICD-10-CM | POA: Diagnosis not present

## 2017-06-14 DIAGNOSIS — D709 Neutropenia, unspecified: Secondary | ICD-10-CM | POA: Diagnosis present

## 2017-06-14 DIAGNOSIS — E039 Hypothyroidism, unspecified: Secondary | ICD-10-CM

## 2017-06-14 DIAGNOSIS — R739 Hyperglycemia, unspecified: Secondary | ICD-10-CM | POA: Diagnosis not present

## 2017-06-14 DIAGNOSIS — E1165 Type 2 diabetes mellitus with hyperglycemia: Secondary | ICD-10-CM | POA: Diagnosis present

## 2017-06-14 DIAGNOSIS — J9811 Atelectasis: Secondary | ICD-10-CM | POA: Diagnosis present

## 2017-06-14 DIAGNOSIS — D696 Thrombocytopenia, unspecified: Secondary | ICD-10-CM | POA: Diagnosis not present

## 2017-06-14 DIAGNOSIS — E861 Hypovolemia: Secondary | ICD-10-CM | POA: Diagnosis not present

## 2017-06-14 DIAGNOSIS — M1990 Unspecified osteoarthritis, unspecified site: Secondary | ICD-10-CM | POA: Diagnosis present

## 2017-06-14 DIAGNOSIS — J984 Other disorders of lung: Secondary | ICD-10-CM

## 2017-06-14 DIAGNOSIS — A4189 Other specified sepsis: Principal | ICD-10-CM | POA: Diagnosis present

## 2017-06-14 DIAGNOSIS — Z7951 Long term (current) use of inhaled steroids: Secondary | ICD-10-CM

## 2017-06-14 DIAGNOSIS — D61818 Other pancytopenia: Secondary | ICD-10-CM | POA: Diagnosis present

## 2017-06-14 DIAGNOSIS — D6959 Other secondary thrombocytopenia: Secondary | ICD-10-CM | POA: Diagnosis present

## 2017-06-14 DIAGNOSIS — Z886 Allergy status to analgesic agent status: Secondary | ICD-10-CM

## 2017-06-14 DIAGNOSIS — Z9119 Patient's noncompliance with other medical treatment and regimen: Secondary | ICD-10-CM

## 2017-06-14 DIAGNOSIS — K760 Fatty (change of) liver, not elsewhere classified: Secondary | ICD-10-CM | POA: Diagnosis present

## 2017-06-14 DIAGNOSIS — J45909 Unspecified asthma, uncomplicated: Secondary | ICD-10-CM | POA: Diagnosis present

## 2017-06-14 DIAGNOSIS — K210 Gastro-esophageal reflux disease with esophagitis: Secondary | ICD-10-CM | POA: Diagnosis not present

## 2017-06-14 DIAGNOSIS — R791 Abnormal coagulation profile: Secondary | ICD-10-CM | POA: Diagnosis present

## 2017-06-14 DIAGNOSIS — E669 Obesity, unspecified: Secondary | ICD-10-CM | POA: Diagnosis present

## 2017-06-14 DIAGNOSIS — J111 Influenza due to unidentified influenza virus with other respiratory manifestations: Secondary | ICD-10-CM

## 2017-06-14 DIAGNOSIS — E119 Type 2 diabetes mellitus without complications: Secondary | ICD-10-CM | POA: Diagnosis not present

## 2017-06-14 DIAGNOSIS — Z79899 Other long term (current) drug therapy: Secondary | ICD-10-CM

## 2017-06-14 DIAGNOSIS — I9589 Other hypotension: Secondary | ICD-10-CM | POA: Diagnosis not present

## 2017-06-14 DIAGNOSIS — D703 Neutropenia due to infection: Secondary | ICD-10-CM | POA: Diagnosis not present

## 2017-06-14 HISTORY — DX: Fever, unspecified: R50.9

## 2017-06-14 LAB — COMPREHENSIVE METABOLIC PANEL
ALBUMIN: 2.9 g/dL — AB (ref 3.5–5.0)
ALT: 54 U/L (ref 14–54)
ANION GAP: 8 (ref 5–15)
AST: 116 U/L — ABNORMAL HIGH (ref 15–41)
Alkaline Phosphatase: 160 U/L — ABNORMAL HIGH (ref 38–126)
BUN: 6 mg/dL (ref 6–20)
CO2: 21 mmol/L — AB (ref 22–32)
Calcium: 8.5 mg/dL — ABNORMAL LOW (ref 8.9–10.3)
Chloride: 102 mmol/L (ref 101–111)
Creatinine, Ser: 0.71 mg/dL (ref 0.44–1.00)
GFR calc Af Amer: >60 mL/min (ref >60)
GFR calc non Af Amer: >60 mL/min (ref >60)
GLUCOSE: 321 mg/dL — AB (ref 65–99)
POTASSIUM: 3.6 mmol/L (ref 3.5–5.1)
SODIUM: 131 mmol/L — AB (ref 135–145)
Total Bilirubin: 2.8 mg/dL — ABNORMAL HIGH (ref 0.3–1.2)
Total Protein: 7.8 g/dL (ref 6.5–8.1)

## 2017-06-14 LAB — I-STAT CG4 LACTIC ACID, ED: LACTIC ACID, VENOUS: 1.69 mmol/L (ref 0.5–1.9)

## 2017-06-14 LAB — CBC WITH DIFFERENTIAL/PLATELET
BASOS ABS: 0 10*3/uL (ref 0.0–0.1)
Basophils Relative: 1 %
Eosinophils Absolute: 0 10*3/uL (ref 0.0–0.7)
Eosinophils Relative: 0 %
HEMATOCRIT: 31.6 % — AB (ref 36.0–46.0)
HEMOGLOBIN: 10.8 g/dL — AB (ref 12.0–15.0)
LYMPHS PCT: 29 %
Lymphs Abs: 0.2 10*3/uL — ABNORMAL LOW (ref 0.7–4.0)
MCH: 29.1 pg (ref 26.0–34.0)
MCHC: 34.2 g/dL (ref 30.0–36.0)
MCV: 85.2 fL (ref 78.0–100.0)
MONOS PCT: 18 %
Monocytes Absolute: 0.1 10*3/uL (ref 0.1–1.0)
Neutro Abs: 0.5 10*3/uL — ABNORMAL LOW (ref 1.7–7.7)
Neutrophils Relative %: 52 %
Platelets: 48 10*3/uL — ABNORMAL LOW (ref 150–400)
RBC: 3.71 MIL/uL — AB (ref 3.87–5.11)
RDW: 14.3 % (ref 11.5–15.5)
WBC: 0.8 10*3/uL — CL (ref 4.0–10.5)

## 2017-06-14 LAB — URINALYSIS, ROUTINE W REFLEX MICROSCOPIC
BILIRUBIN URINE: NEGATIVE
Bacteria, UA: NONE SEEN
Glucose, UA: >=500 mg/dL — AB
KETONES UR: NEGATIVE mg/dL
LEUKOCYTES UA: NEGATIVE
NITRITE: NEGATIVE
PROTEIN: NEGATIVE mg/dL
Specific Gravity, Urine: 1.023 (ref 1.005–1.030)
pH: 6 (ref 5.0–8.0)

## 2017-06-14 MED ORDER — ALBUTEROL SULFATE (2.5 MG/3ML) 0.083% IN NEBU
5.0000 mg | INHALATION_SOLUTION | Freq: Once | RESPIRATORY_TRACT | Status: AC
  Administered 2017-06-14: 5 mg via RESPIRATORY_TRACT
  Filled 2017-06-14: qty 6

## 2017-06-14 MED ORDER — ACETAMINOPHEN 325 MG PO TABS
650.0000 mg | ORAL_TABLET | Freq: Once | ORAL | Status: AC
  Administered 2017-06-14: 650 mg via ORAL
  Filled 2017-06-14: qty 2

## 2017-06-14 MED ORDER — OSELTAMIVIR PHOSPHATE 75 MG PO CAPS
75.0000 mg | ORAL_CAPSULE | Freq: Once | ORAL | Status: AC
  Administered 2017-06-14: 75 mg via ORAL
  Filled 2017-06-14: qty 1

## 2017-06-14 MED ORDER — SODIUM CHLORIDE 0.9 % IV BOLUS (SEPSIS)
1000.0000 mL | Freq: Once | INTRAVENOUS | Status: AC
  Administered 2017-06-14: 1000 mL via INTRAVENOUS

## 2017-06-14 MED ORDER — DEXTROSE 5 % IV SOLN
2.0000 g | Freq: Three times a day (TID) | INTRAVENOUS | Status: DC
  Administered 2017-06-15 – 2017-06-17 (×8): 2 g via INTRAVENOUS
  Filled 2017-06-14 (×9): qty 2

## 2017-06-14 MED ORDER — DEXTROSE 5 % IV SOLN
2.0000 g | Freq: Once | INTRAVENOUS | Status: AC
  Administered 2017-06-14: 2 g via INTRAVENOUS
  Filled 2017-06-14: qty 2

## 2017-06-14 NOTE — ED Provider Notes (Signed)
[facility] EMERGENCY DEPARTMENT Provider Note   CSN: [id number] Arrival date & time: 06/14/17  1448     History   Chief Complaint Chief Complaint  Patient presents with  . Cough  . Fever    HPI Holly Mueller is a 52 y.o. female.  HPI  Patient with a history of chronic leukopenia presenting with complaint of cough congestion and fever.  She states she has had a chronic cough for about a month but over the past 3 days multiple household members have been sick and her congestion and cough has worsened along fever.  She has tried breathing treatments at home without much relief in her cough.  She has not had any vomiting or diarrhea.  She has no difficulty breathing between coughing episodes.  She did not receive her flu vaccine this year.  There are no other associated systemic symptoms, there are no other alleviating or modifying factors.   Past Medical History:  Diagnosis Date  . Arthritis    bursitis left hip flares-not an issue  . Asthma 2004  . Edentulous    10-19-13 at present  . Epilepsy ([place]) in 1972   No seizures since 1972. Previously treated with phenobarbital.   . GERD (gastroesophageal reflux disease) 2008  . Headache(784.0)    hx of migraines   . Hypothyroidism 2008  . Lower esophageal ring 08/18/2013  . Seasonal allergies 2003  . Shortness of breath     Patient Active Problem List   Diagnosis Date Noted  . Cirrhosis of liver without ascites ([place]) 11/17/2016  . Thrombocytopenia ([place]) 11/17/2016  . Chronic leukopenia 11/17/2016  . Bicytopenia 10/24/2016  . Epistaxis 10/23/2016  . GERD (gastroesophageal reflux disease) 10/23/2016  . Cough 12/09/2015  . Chronic pain of left thumb 10/09/2015  . Precordial chest pain 08/03/2015  . Abscess of skin of abdomen 07/10/2014  . Bursitis of left hip 09/14/2013  . Lower esophageal ring 08/18/2013  . Dyspnea 08/17/2013  . Ganglion cyst of wrist 08/24/2012  . Hip pain, bilateral 08/23/2012  .  Umbilical hernia 47/53/3917  . Healthcare maintenance 02/14/2012  . Osteoarthritis 02/12/2012  . Elevated LFTs 10/30/2011  . Allergic rhinitis 08/26/2011  . Obesity 07/08/2010  . SEIZURES, HX OF 12/12/2009  . DENTAL CARIES 12/18/2008  . DEPRESSION 07/26/2008  . MIGRAINE HEADACHE 11/15/2007  . HYPOTHYROIDISM NOS 10/12/2006  . ASTHMA, EXTRINSIC NOS 07/30/2006    Past Surgical History:  Procedure Laterality Date  . BALLOON DILATION N/A 10/24/2013   Procedure: BALLOON DILATION;  Surgeon: Gatha Mayer, MD;  Location: [facility];  Service: Endoscopy;  Laterality: N/A;  . CESAREAN SECTION     x2  . DENTAL SURGERY     multiple extractions 3'15  . ESOPHAGOGASTRODUODENOSCOPY N/A 10/24/2013   Procedure: ESOPHAGOGASTRODUODENOSCOPY (EGD);  Surgeon: Gatha Mayer, MD;  Location: [facility];  Service: Endoscopy;  Laterality: N/A;  . LIVER BIOPSY  06/30/2012   Procedure: LIVER BIOPSY;  Surgeon: Shann Medal, MD;  Location: [facility];  Service: General;;  . NOVASURE ABLATION    . SHOULDER ARTHROSCOPY Right 2011  . UMBILICAL HERNIA REPAIR N/A 06/30/2012   Procedure: remove umbilicus;  Surgeon: Shann Medal, MD;  Location: [facility];  Service: General;  Laterality: N/A;  . VENTRAL HERNIA REPAIR N/A 06/30/2012   Procedure: LAPAROSCOPIC VENTRAL HERNIA;  Surgeon: Shann Medal, MD;  Location: [facility];  Service: General;  Laterality: N/A;  With Mesh  . WISDOM TOOTH EXTRACTION  OB History    No data available       Home Medications    Prior to Admission medications   Medication Sig Start Date End Date Taking? Authorizing Provider  albuterol (PROAIR HFA) 108 (90 Base) MCG/ACT inhaler INHALE 2 PUFFS INTO THE LUNGS EVERY 6 HOURS AS NEEDED FOR SHORTNESS OF BREATH 10/23/16  Yes Gonfa, Taye T, MD  albuterol (PROVENTIL) (2.5 MG/3ML) 0.083% nebulizer solution Take 3 mLs (2.5 mg total) by nebulization every 6 (six) hours as needed for wheezing or shortness of breath. 10/23/16  Yes Mercy Riding, MD    benzonatate (TESSALON) 100 MG capsule Take 1 capsule (100 mg total) by mouth 2 (two) times daily as needed for cough. 03/11/17  Yes [name], MD  budesonide-formoterol [facility]) 160-4.5 MCG/ACT inhaler Inhale 2 puffs into the lungs 2 (two) times daily. 04/05/17  Yes Tanna Furry, MD  cetirizine (ZYRTEC) 10 MG tablet Take 1 tablet (10 mg total) by mouth daily. 01/27/17  Yes [name], MD  fluticasone [facility]) 50 MCG/ACT nasal spray Place 2 sprays into both nostrils daily.   Yes [provider]  levothyroxine (SYNTHROID, LEVOTHROID) 125 MCG tablet Take 2 tablets (250 mcg total) by mouth daily. 03/11/17  Yes [name], MD  pantoprazole (PROTONIX) 40 MG tablet Take 1 tablet (40 mg total) by mouth daily. 03/11/17  Yes [name], MD  promethazine-dextromethorphan (PROMETHAZINE-DM) 6.25-15 MG/5ML syrup Take 5 mLs by mouth 4 (four) times daily as needed for cough. 04/05/17  Yes Tanna Furry, MD    Family History Family History  Problem Relation Age of Onset  . Hypertension Mother   . Diabetes Mother   . Lung cancer Father   . Stroke Father   . Diabetes Sister   . Hypertension Sister     Social History Social History   Tobacco Use  . Smoking status: Former Smoker    Packs/day: 0.25    Types: Cigarettes    Last attempt to quit: 1997    Years since quitting: 22.1  . Smokeless tobacco: Never Used  Substance Use Topics  . Alcohol use: Yes    Alcohol/week: 0.0 oz    Comment: occ  . Drug use: No     Allergies   Aspirin   Review of Systems Review of Systems  ROS reviewed and all otherwise negative except for mentioned in HPI   Physical Exam Updated Vital Signs BP 123/68   Pulse (!) 105   Temp 99.7 F (37.6 C) (Oral)   Resp 20   SpO2 100%  Vitals reviewed Physical Exam  Physical Examination: General appearance - alert, well appearing, and in no distress Mental status - alert, oriented to person, place, and time Eyes -no  conjunctival injection, no scleral icterus Mouth - mucous membranes moist, pharynx normal without lesions Neck - supple, no significant adenopathy Chest - clear to auscultation, no wheezes, rales or rhonchi, symmetric air entry, frequent coughing episdoes Heart - normal rate, regular rhythm, normal S1, S2, no murmurs, rubs, clicks or gallops Abdomen - soft, nontender, nondistended, no masses or organomegaly Neurological - alert, oriented, normal speech, no focal finding Extremities - peripheral pulses normal, no pedal edema, no clubbing or cyanosis Skin - normal coloration and turgor, no rashes,    ED Treatments / Results  Labs (all labs ordered are listed, but only abnormal results are displayed) Labs Reviewed  COMPREHENSIVE METABOLIC PANEL - Abnormal; Notable for the following components:      Result Value   Sodium 131 (*)  CO2 21 (*)    Glucose, Bld 321 (*)    Calcium 8.5 (*)    Albumin 2.9 (*)    AST 116 (*)    Alkaline Phosphatase 160 (*)    Total Bilirubin 2.8 (*)    All other components within normal limits  CBC WITH DIFFERENTIAL/PLATELET - Abnormal; Notable for the following components:   WBC 0.8 (*)    RBC 3.71 (*)    Hemoglobin 10.8 (*)    HCT 31.6 (*)    Platelets 48 (*)    Neutro Abs 0.5 (*)    Lymphs Abs 0.2 (*)    All other components within normal limits  URINALYSIS, ROUTINE W REFLEX MICROSCOPIC - Abnormal; Notable for the following components:   Color, Urine AMBER (*)    Glucose, UA >=500 (*)    Hgb urine dipstick LARGE (*)    Squamous Epithelial / LPF 0-5 (*)    All other components within normal limits  CULTURE, BLOOD (ROUTINE X 2)  CULTURE, BLOOD (ROUTINE X 2)  URINE CULTURE  RESPIRATORY PANEL BY PCR  I-STAT CG4 LACTIC ACID, ED  I-STAT CG4 LACTIC ACID, ED    EKG  EKG Interpretation None       Radiology Dg Chest 2 View  Result Date: 06/14/2017 CLINICAL DATA:  Productive cough. EXAM: CHEST  2 VIEW COMPARISON:  Radiographs of April 05, 2017. FINDINGS: The heart size and mediastinal contours are within normal limits. Hypoinflation of the lungs is noted with mild bibasilar subsegmental atelectasis. No pneumothorax or pleural effusion is noted. The visualized skeletal structures are unremarkable. IMPRESSION: Hypoinflation of lungs with mild bibasilar subsegmental atelectasis. Electronically Signed   By: Marijo Conception, M.D.   On: 06/14/2017 17:05    Procedures Procedures (including critical care time)  Medications Ordered in ED Medications  ceFEPIme (MAXIPIME) 2 g in dextrose 5 % 50 mL IVPB (not administered)  acetaminophen (TYLENOL) tablet 650 mg (650 mg Oral Given 06/14/17 1633)  sodium chloride 0.9 % bolus 1,000 mL (1,000 mLs Intravenous New Bag/Given 06/14/17 2132)  albuterol (PROVENTIL) (2.5 MG/3ML) 0.083% nebulizer solution 5 mg (5 mg Nebulization Given 06/14/17 2134)  ceFEPIme (MAXIPIME) 2 g in dextrose 5 % 50 mL IVPB (0 g Intravenous Stopped 06/14/17 2212)  oseltamivir (TAMIFLU) capsule 75 mg (75 mg Oral Given 06/14/17 2212)   CRITICAL CARE Performed by: Marcha Dutton, Alper Guilmette L Total critical care time: 35 minutes Critical care time was exclusive of separately billable procedures and treating other patients. Critical care was necessary to treat or prevent imminent or life-threatening deterioration. Critical care was time spent personally by me on the following activities: development of treatment plan with patient and/or surrogate as well as nursing, discussions with consultants, evaluation of patient's response to treatment, examination of patient, obtaining history from patient or surrogate, ordering and performing treatments and interventions, ordering and review of laboratory studies, ordering and review of radiographic studies, pulse oximetry and re-evaluation of patient's condition.  Initial Impression / Assessment and Plan / ED Course  I have reviewed the triage vital signs and the nursing notes.  Pertinent labs & imaging  results that were available during my care of the patient were reviewed by me and considered in my medical decision making (see chart for details).     Patient presenting with cough congestion and fever.  She has a history of chronic leukopenia of unknown etiology.  Her symptoms appear more viral in nature however she is febrile with ANC less than 500.  Chest x-ray does not show any signs of pneumonia.  Blood and urine cultures obtained as well as lactate.  The lactate was normal.  Patient started on IV cefepime.  Respiratory viral panel was obtained and patient given an albuterol treatment to assist with her cough as well as some IV fluids.  Discussed with hospitalist for admission and further management.  Final Clinical Impressions(s) / ED Diagnoses   Final diagnoses:  Neutropenic fever [facility]  Influenza-like illness    ED Discharge Orders    None       Pixie Casino, MD 06/14/17 2233

## 2017-06-14 NOTE — Progress Notes (Signed)
Pharmacy Antibiotic Note  Holly Mueller is a 52 y.o. female admitted on 06/14/2017 with febrile neutropenia  Pharmacy has been consulted for cefepime dosing.  Plan: Start cefepime 2g IV Q8h Monitor clinical picture, renal function F/U C&S, abx deescalation / LOT    Temp (24hrs), Avg:100.1 F (37.8 C), Min:99.7 F (37.6 C), Max:100.5 F (38.1 C)  Recent Labs  Lab 06/14/17 1629 06/14/17 1659  WBC 0.8*  --   CREATININE 0.71  --   LATICACIDVEN  --  1.69    CrCl cannot be calculated (Unknown ideal weight.).    Allergies  Allergen Reactions  . Aspirin Nausea Only    Upset stomach    Thank you for allowing pharmacy to be a part of this patient's care.  Reginia Naas 06/14/2017 9:04 PM

## 2017-06-14 NOTE — H&P (Signed)
[facility] Admission History and Physical Service Pager: [phone]  Patient name: Holly Mueller Medical record number: [id number] Date of birth: 1965-09-01 Age: 52 y.o. Gender: female  Primary Care Provider: [name], MD Consultants: None Code Status: Full  Chief Complaint: fever, cough  Assessment and Plan: Holly Mueller is a 52 y.o. female presenting with fever, productive cough, sore throat x3 days. PMH is significant for chronic leukopenia and thromobcytopenia, hx of asthma, hx of seizures, depression, hypothyroidism, OA, GERD.    Neutropenic Fever w/ h/o neutropenia, thrombocytopenia, ?h/o cirrhosis Presented meeting sepsis criteria with tachycardia 102, fever 100.5, neutropenia (WBC 0.8, neutrophil 52) but without lactic acidosis (1.69). qSOFA 0 indicating low risk. In the ED, had blood and urine cultures as well as RVP drawn and received Cefepime. S/p 1L NS bolus. CXR showed bibasilar atelectasis with no effusions or consolidation noted, although difficult to view due to hypoinflation. Also with thrombocytopenia to 49, chronically patient seems to be around 70-80s in the last year.  On chart review has history of chronic neutropenia and thrombocytosis gradually progressing since 2014. Has seen Heme/Onc with last visit 12/2016. Previous bone marrow biopsy did not reveal evidence of leukemia or myelodysplasia. The most recent Heme/onc note suggests likely etiology is liver cirrhosis and ordered abdominal US which patient never obtained. Also has chronically elevated LFTs.  AST elevated this admission at 116, ALT wnl at 54. CT Abd/pelvis with splenomegaly and questionable nodular liver. Liver biopsy performed during hospitalization in 06/2012 for incarcerated hernias and demonstrated fibrosis with chronic and active inflammation consistent with fibrosis. No liver or spleen edge palpated on admission although exam difficult due to patient's body habitus.  Because patient has known sick contacts, it is likely she has a viral URI which is consistent with her symptoms, however due to neutropenia is at greater risk for decompensation. Lungs CTA without wheezing, rales, or rhonchi. Unlikely due to cardiac etiology as only having chest pain located over side of rib cage with coughing. Sating high 90s on room air with comfortable work of breathing. Pneumonia less likely with lack of crackles on exam and CXR without consolidation, however could be lagging behind symptoms. Unlikely PE as symptoms progressed gradually over 3 days and wouldn't typically present with fever. - admit to stepdown, attending Dr. Nori Riis - vitals per unit routine - continue Cefepime (2/4 - ) - start Vanc (2/5 - ) - blood, urine cultures pending - RVP pending - obtain flu panel - droplet precautions - am CBC, BMP - obtain HIV, EBV, CMV - US Abdomen w/ elastrography -desired by heme/onc at last visit - Obtain PT/INR  - PT/OT - tylenol PRN - zorfran PRN  Hyperglycemia Glucose 321 on admission. No known history of diabetes. Last A1c 5.1 in 2010. - obtain A1c  Hypothyroidism Last TSH 0.081 in 11/2016. Takes Synthroid 225mg at home. - continue home synthroid - TSH  Asthma vs. Restrictive Lung Disease Last PFT in 11/2016 with FEV1 1.54, FVC 1.8, with ratio 85. PFT report states [facility] reduced relative to the SVC indicating air trapping, lung volumes are reduced, and did not show significant response to bronchodilators indicating a restrictive process. Takes Flonase, albuterol, Zyrtec, Symbicort at home. - continue home meds per formulary  GERD Denies active symptoms. Possible eosinophilic esophagitis on EGD in 2015, dilated lower esophageal ring in lower 1/3 of esophagus. Takes Protonix at home - continue home meds  H/o seizures Last seizure in 1970s on chart review and  previously treated with phenobarbital. Does not require chronic prophylaxis. -  monitor  Depression Previously treated with Elavil but hasn't taken since 2013.  - Monitor  FEN/GI: Heart Healthy Prophylaxis: SCDs due to thrombocytopenia  Disposition: admit to stepdown, attending Dr. Nori Riis  History of Present Illness:  Holly Mueller is a 52 y.o. female presenting with fever, cough, scratching throat for the past 3 days. Patient indicates that she has had muscles aches. Patient indicates having greenish sputum. No wheezing. No hemoptysis. No vomiting, diarrhea, constipation. Patient states that her family is also sick including her son who still has cough. Has been sick off and on since November. Denies chest pain except MSK-type pain with coughing.  In the ED, had blood and urine cultures, RVP drawn and received Cefepime and Tamiflu.  Review Of Systems: Per HPI with the following additions:  Review of Systems  Constitutional: Negative for fever and malaise/fatigue.  HENT: Positive for congestion and sore throat.   Eyes: Negative for pain and redness.  Respiratory: Positive for cough and sputum production. Negative for hemoptysis, shortness of breath and wheezing.   Cardiovascular: Negative for chest pain, orthopnea and leg swelling.  Gastrointestinal: Negative for abdominal pain, constipation, diarrhea, nausea and vomiting.  Genitourinary: Negative for dysuria and urgency.  Musculoskeletal: Positive for myalgias.  Skin: Negative for rash.  Neurological: Negative for dizziness and headaches.  Endo/Heme/Allergies: Bruises/bleeds easily.    Patient Active Problem List   Diagnosis Date Noted  . Cirrhosis of liver without ascites ([place]) 11/17/2016  . Thrombocytopenia ([place]) 11/17/2016  . Chronic leukopenia 11/17/2016  . Bicytopenia 10/24/2016  . Epistaxis 10/23/2016  . GERD (gastroesophageal reflux disease) 10/23/2016  . Cough 12/09/2015  . Chronic pain of left thumb 10/09/2015  . Precordial chest pain 08/03/2015  . Abscess of skin of abdomen 07/10/2014  .  Bursitis of left hip 09/14/2013  . Lower esophageal ring 08/18/2013  . Dyspnea 08/17/2013  . Ganglion cyst of wrist 08/24/2012  . Hip pain, bilateral 08/23/2012  . Umbilical hernia 93/26/7124  . Healthcare maintenance 02/14/2012  . Osteoarthritis 02/12/2012  . Elevated LFTs 10/30/2011  . Allergic rhinitis 08/26/2011  . Obesity 07/08/2010  . SEIZURES, HX OF 12/12/2009  . DENTAL CARIES 12/18/2008  . DEPRESSION 07/26/2008  . MIGRAINE HEADACHE 11/15/2007  . HYPOTHYROIDISM NOS 10/12/2006  . ASTHMA, EXTRINSIC NOS 07/30/2006    Past Medical History: Past Medical History:  Diagnosis Date  . Arthritis    bursitis left hip flares-not an issue  . Asthma 2004  . Edentulous    10-19-13 at present  . Epilepsy ([place]) in 1972   No seizures since 1972. Previously treated with phenobarbital.   . GERD (gastroesophageal reflux disease) 2008  . Headache(784.0)    hx of migraines   . Hypothyroidism 2008  . Lower esophageal ring 08/18/2013  . Seasonal allergies 2003  . Shortness of breath     Past Surgical History: Past Surgical History:  Procedure Laterality Date  . BALLOON DILATION N/A 10/24/2013   Procedure: BALLOON DILATION;  Surgeon: Gatha Mayer, MD;  Location: [facility];  Service: Endoscopy;  Laterality: N/A;  . CESAREAN SECTION     x2  . DENTAL SURGERY     multiple extractions 3'15  . ESOPHAGOGASTRODUODENOSCOPY N/A 10/24/2013   Procedure: ESOPHAGOGASTRODUODENOSCOPY (EGD);  Surgeon: Gatha Mayer, MD;  Location: [facility];  Service: Endoscopy;  Laterality: N/A;  . LIVER BIOPSY  06/30/2012   Procedure: LIVER BIOPSY;  Surgeon: Shann Medal, MD;  Location: [facility];  Service: General;;  . NOVASURE ABLATION    . SHOULDER ARTHROSCOPY Right 2011  . UMBILICAL HERNIA REPAIR N/A 06/30/2012   Procedure: remove umbilicus;  Surgeon: Shann Medal, MD;  Location: [facility];  Service: General;  Laterality: N/A;  . VENTRAL HERNIA REPAIR N/A 06/30/2012   Procedure: LAPAROSCOPIC VENTRAL HERNIA;   Surgeon: Shann Medal, MD;  Location: [facility];  Service: General;  Laterality: N/A;  With Mesh  . WISDOM TOOTH EXTRACTION      Social History: Social History   Tobacco Use  . Smoking status: Former Smoker    Packs/day: 0.25    Types: Cigarettes    Last attempt to quit: 1997    Years since quitting: 22.1  . Smokeless tobacco: Never Used  Substance Use Topics  . Alcohol use: Yes    Alcohol/week: 0.0 oz    Comment: occ  . Drug use: No   Additional social history: Denies every drinking daily   Please also refer to relevant sections of EMR.  Family History: Family History  Problem Relation Age of Onset  . Hypertension Mother   . Diabetes Mother   . Lung cancer Father   . Stroke Father   . Diabetes Sister   . Hypertension Sister     Allergies and Medications: Allergies  Allergen Reactions  . Aspirin Nausea Only    Upset stomach   No current facility-administered medications on file prior to encounter.    Current Outpatient Medications on File Prior to Encounter  Medication Sig Dispense Refill  . albuterol (PROAIR HFA) 108 (90 Base) MCG/ACT inhaler INHALE 2 PUFFS INTO THE LUNGS EVERY 6 HOURS AS NEEDED FOR SHORTNESS OF BREATH 8.5 g 5  . albuterol (PROVENTIL) (2.5 MG/3ML) 0.083% nebulizer solution Take 3 mLs (2.5 mg total) by nebulization every 6 (six) hours as needed for wheezing or shortness of breath. 75 mL 2  . benzonatate (TESSALON) 100 MG capsule Take 1 capsule (100 mg total) by mouth 2 (two) times daily as needed for cough. 30 capsule 0  . budesonide-formoterol (SYMBICORT) 160-4.5 MCG/ACT inhaler Inhale 2 puffs into the lungs 2 (two) times daily. 1 Inhaler 12  . cetirizine (ZYRTEC) 10 MG tablet Take 1 tablet (10 mg total) by mouth daily. 30 tablet 11  . fluticasone (FLONASE) 50 MCG/ACT nasal spray Place 2 sprays into both nostrils daily.    Marland Kitchen levothyroxine (SYNTHROID, LEVOTHROID) 125 MCG tablet Take 2 tablets (250 mcg total) by mouth daily. 60 tablet 1  .  pantoprazole (PROTONIX) 40 MG tablet Take 1 tablet (40 mg total) by mouth daily. 30 tablet 3  . promethazine-dextromethorphan (PROMETHAZINE-DM) 6.25-15 MG/5ML syrup Take 5 mLs by mouth 4 (four) times daily as needed for cough. 120 mL 0    Objective: BP 101/65   Pulse 87   Temp 99.7 F (37.6 C) (Oral)   Resp 20   SpO2 97%  Exam: General: obese female sitting in bed coughing, in NAD Eyes: anicteric sclerae, EOMI ENTM: moist mucous membranes Cardiovascular: RRR, no murmurs/rubs/gallops Respiratory: CTAB, no wheezes/rales/rhonchi. Sating 97% on room air with no increased WOB Gastrointestinal: soft, NTND, obese abdomen. No hepatosplenomegaly appreciated on exam. +BS. No fluid wave. MSK: ROM grossly intact. Derm: no rashes or lesions. Warm and well perfused. Neuro: alert and oriented. No focal deficits. Psych: mood and affect appropriate.  Labs and Imaging: CBC BMET  Recent Labs  Lab 06/14/17 1629  WBC 0.8*  HGB 10.8*  HCT 31.6*  PLT 48*   Recent Labs  Lab  06/14/17 1629  NA 131*  K 3.6  CL 102  CO2 21*  BUN 6  CREATININE 0.71  GLUCOSE 321*  CALCIUM 8.5*     Dg Chest 2 View  Result Date: 06/14/2017 CLINICAL DATA:  Productive cough. EXAM: CHEST  2 VIEW COMPARISON:  Radiographs of April 05, 2017. FINDINGS: The heart size and mediastinal contours are within normal limits. Hypoinflation of the lungs is noted with mild bibasilar subsegmental atelectasis. No pneumothorax or pleural effusion is noted. The visualized skeletal structures are unremarkable. IMPRESSION: Hypoinflation of lungs with mild bibasilar subsegmental atelectasis. Electronically Signed   By: Marijo Conception, M.D.   On: 06/14/2017 17:05    Rory Percy, DO 06/14/2017, 9:56 PM PGY-1, [place] Intern pager: [phone], text pages welcome  UPPER LEVEL ADDENDUM  I have read the above note and made revisions highlighted in blue.  Kerrin Mo, MD, PGY-3 [facility]

## 2017-06-14 NOTE — ED Triage Notes (Addendum)
Pt reports ongoing productive cough with yellow/green sputum. Pt coughs until she gags, therefore unable to eat/drink. Pt unable to sleep. Febrile at triage. Has headache and bodyaches, mask on pt at triage.

## 2017-06-15 ENCOUNTER — Encounter (HOSPITAL_COMMUNITY): Payer: Self-pay | Admitting: General Practice

## 2017-06-15 ENCOUNTER — Inpatient Hospital Stay (HOSPITAL_COMMUNITY): Payer: Medicaid Other

## 2017-06-15 ENCOUNTER — Ambulatory Visit: Payer: Medicaid Other | Admitting: Family Medicine

## 2017-06-15 DIAGNOSIS — R739 Hyperglycemia, unspecified: Secondary | ICD-10-CM

## 2017-06-15 DIAGNOSIS — E6609 Other obesity due to excess calories: Secondary | ICD-10-CM

## 2017-06-15 DIAGNOSIS — K210 Gastro-esophageal reflux disease with esophagitis: Secondary | ICD-10-CM

## 2017-06-15 DIAGNOSIS — E669 Obesity, unspecified: Secondary | ICD-10-CM

## 2017-06-15 LAB — RESPIRATORY PANEL BY PCR
Adenovirus: NOT DETECTED
BORDETELLA PERTUSSIS-RVPCR: NOT DETECTED
CORONAVIRUS 229E-RVPPCR: NOT DETECTED
Chlamydophila pneumoniae: NOT DETECTED
Coronavirus HKU1: NOT DETECTED
Coronavirus NL63: NOT DETECTED
Coronavirus OC43: NOT DETECTED
INFLUENZA A H3-RVPPCR: DETECTED — AB
INFLUENZA B-RVPPCR: NOT DETECTED
Metapneumovirus: NOT DETECTED
Mycoplasma pneumoniae: NOT DETECTED
PARAINFLUENZA VIRUS 4-RVPPCR: NOT DETECTED
Parainfluenza Virus 1: NOT DETECTED
Parainfluenza Virus 2: NOT DETECTED
Parainfluenza Virus 3: NOT DETECTED
RESPIRATORY SYNCYTIAL VIRUS-RVPPCR: NOT DETECTED
Rhinovirus / Enterovirus: NOT DETECTED

## 2017-06-15 LAB — INFLUENZA PANEL BY PCR (TYPE A & B)
Influenza A By PCR: POSITIVE — AB
Influenza B By PCR: NEGATIVE

## 2017-06-15 LAB — PATHOLOGIST SMEAR REVIEW

## 2017-06-15 LAB — APTT: APTT: 35 s (ref 24–36)

## 2017-06-15 LAB — TSH: TSH: 10.886 u[IU]/mL — ABNORMAL HIGH (ref 0.350–4.500)

## 2017-06-15 LAB — PROTIME-INR
INR: 1.38
PROTHROMBIN TIME: 16.8 s — AB (ref 11.4–15.2)

## 2017-06-15 LAB — HEMOGLOBIN A1C
Hgb A1c MFr Bld: 9.1 % — ABNORMAL HIGH (ref 4.8–5.6)
MEAN PLASMA GLUCOSE: 214.47 mg/dL

## 2017-06-15 LAB — T4, FREE: Free T4: 0.59 ng/dL — ABNORMAL LOW (ref 0.61–1.12)

## 2017-06-15 LAB — SAVE SMEAR

## 2017-06-15 LAB — MONONUCLEOSIS SCREEN: Mono Screen: NEGATIVE

## 2017-06-15 MED ORDER — VANCOMYCIN HCL IN DEXTROSE 1-5 GM/200ML-% IV SOLN
1000.0000 mg | Freq: Three times a day (TID) | INTRAVENOUS | Status: DC
  Administered 2017-06-15 – 2017-06-16 (×5): 1000 mg via INTRAVENOUS
  Filled 2017-06-15 (×6): qty 200

## 2017-06-15 MED ORDER — ONDANSETRON HCL 4 MG PO TABS: 4.0000 mg | ORAL_TABLET | Freq: Four times a day (QID) | ORAL | Status: DC | PRN

## 2017-06-15 MED ORDER — POLYETHYLENE GLYCOL 3350 17 G PO PACK: 17.0000 g | PACK | Freq: Every day | ORAL | Status: DC | PRN

## 2017-06-15 MED ORDER — LEVOTHYROXINE SODIUM 100 MCG PO TABS
250.0000 ug | ORAL_TABLET | Freq: Every day | ORAL | Status: DC
  Administered 2017-06-15 – 2017-06-19 (×5): 250 ug via ORAL
  Filled 2017-06-15 (×3): qty 3
  Filled 2017-06-15: qty 2.5
  Filled 2017-06-15 (×2): qty 3

## 2017-06-15 MED ORDER — INFLUENZA VAC SPLIT QUAD 0.5 ML IM SUSY: 0.5000 mL | PREFILLED_SYRINGE | INTRAMUSCULAR | Status: DC | PRN

## 2017-06-15 MED ORDER — SODIUM CHLORIDE 0.9 % IV BOLUS (SEPSIS)
1000.0000 mL | Freq: Once | INTRAVENOUS | Status: AC
  Administered 2017-06-15: 1000 mL via INTRAVENOUS

## 2017-06-15 MED ORDER — MOMETASONE FURO-FORMOTEROL FUM 200-5 MCG/ACT IN AERO
2.0000 | INHALATION_SPRAY | Freq: Two times a day (BID) | RESPIRATORY_TRACT | Status: DC
  Administered 2017-06-15 – 2017-06-19 (×8): 2 via RESPIRATORY_TRACT
  Filled 2017-06-15 (×2): qty 8.8

## 2017-06-15 MED ORDER — ONDANSETRON HCL 4 MG/2ML IJ SOLN: 4.0000 mg | Freq: Four times a day (QID) | INTRAMUSCULAR | Status: DC | PRN

## 2017-06-15 MED ORDER — BENZONATATE 100 MG PO CAPS
200.0000 mg | ORAL_CAPSULE | Freq: Three times a day (TID) | ORAL | Status: DC | PRN
  Administered 2017-06-16 – 2017-06-18 (×6): 200 mg via ORAL
  Filled 2017-06-15 (×6): qty 2

## 2017-06-15 MED ORDER — FLUTICASONE PROPIONATE 50 MCG/ACT NA SUSP
2.0000 | Freq: Every day | NASAL | Status: DC
  Administered 2017-06-15 – 2017-06-19 (×5): 2 via NASAL
  Filled 2017-06-15: qty 16

## 2017-06-15 MED ORDER — ALBUTEROL SULFATE (2.5 MG/3ML) 0.083% IN NEBU: 2.5000 mg | INHALATION_SOLUTION | Freq: Four times a day (QID) | RESPIRATORY_TRACT | Status: DC | PRN

## 2017-06-15 MED ORDER — OSELTAMIVIR PHOSPHATE 75 MG PO CAPS
75.0000 mg | ORAL_CAPSULE | Freq: Two times a day (BID) | ORAL | Status: DC
  Administered 2017-06-15 – 2017-06-19 (×9): 75 mg via ORAL
  Filled 2017-06-15 (×10): qty 1

## 2017-06-15 MED ORDER — ACETAMINOPHEN 325 MG PO TABS
650.0000 mg | ORAL_TABLET | Freq: Four times a day (QID) | ORAL | Status: DC | PRN
  Administered 2017-06-15 – 2017-06-18 (×7): 650 mg via ORAL
  Filled 2017-06-15 (×8): qty 2

## 2017-06-15 MED ORDER — DM-GUAIFENESIN ER 30-600 MG PO TB12
1.0000 | ORAL_TABLET | Freq: Two times a day (BID) | ORAL | Status: DC
  Administered 2017-06-15 – 2017-06-19 (×9): 1 via ORAL
  Filled 2017-06-15 (×9): qty 1

## 2017-06-15 MED ORDER — VANCOMYCIN HCL 10 G IV SOLR
2000.0000 mg | Freq: Once | INTRAVENOUS | Status: AC
  Administered 2017-06-15: 2000 mg via INTRAVENOUS
  Filled 2017-06-15: qty 2000

## 2017-06-15 MED ORDER — BENZONATATE 100 MG PO CAPS
100.0000 mg | ORAL_CAPSULE | Freq: Three times a day (TID) | ORAL | Status: DC | PRN
  Administered 2017-06-15: 100 mg via ORAL
  Filled 2017-06-15: qty 1

## 2017-06-15 MED ORDER — ACETAMINOPHEN 650 MG RE SUPP: 650.0000 mg | Freq: Four times a day (QID) | RECTAL | Status: DC | PRN

## 2017-06-15 MED ORDER — LORATADINE 10 MG PO TABS
10.0000 mg | ORAL_TABLET | Freq: Every day | ORAL | Status: DC
  Administered 2017-06-15 – 2017-06-19 (×5): 10 mg via ORAL
  Filled 2017-06-15 (×5): qty 1

## 2017-06-15 MED ORDER — PANTOPRAZOLE SODIUM 40 MG PO TBEC
40.0000 mg | DELAYED_RELEASE_TABLET | Freq: Every day | ORAL | Status: DC
  Administered 2017-06-15 – 2017-06-19 (×5): 40 mg via ORAL
  Filled 2017-06-15 (×5): qty 1

## 2017-06-15 MED ORDER — DM-GUAIFENESIN ER 30-600 MG PO TB12
1.0000 | ORAL_TABLET | Freq: Two times a day (BID) | ORAL | Status: DC | PRN
  Administered 2017-06-15: 1 via ORAL
  Filled 2017-06-15: qty 1

## 2017-06-15 NOTE — ED Notes (Signed)
Amion page sent to admitting intern to add new admit request instead of transfer from SDU to [place]

## 2017-06-15 NOTE — Progress Notes (Signed)
Family Medicine Teaching Service Daily Progress Note Intern Pager: [phone]  Patient name: Holly Mueller Medical record number: [id number] Date of birth: 1965/10/20 Age: 52 y.o. Gender: female  Primary Care Provider: [name], MD Consultants: None Code Status: Full  Pt Overview and Major Events to Date:  2/5 - admitted for neutropenic fever  Assessment and Plan: Holly Mueller is a 52 y.o. female presenting with fever, productive cough, sore throat x3 days. PMH is significant for chronic leukopenia and thromobcytopenia, hx of asthma, hx of seizures, depression, hypothyroidism, OA, GERD.    Neutropenic Fever w/ h/o neutropenia, thrombocytopenia, ?h/o cirrhosis Presented meeting sepsis criteria, S/p 1L NS bolus. Sepsis picture resolving, VSS, afebrile. CXR showed bibasilar atelectasis with no effusions or consolidation noted, although difficult to view due to hypoinflation. WBC 0.8>1.1. H/o chronic neutropenia and thrombocytopenia for the last 4-5 years, with liver biopsy 2014 and Abd [place] this admission consistent with cirrhosis. Previously with extensive outpatient Heme/Onc workup including bone marrow biopsy negative for myelodysplasia or leukemia. Peripheral smear this admission with pancytopenia but no atypia. PT/INR with elevated PT 16.8. Monospot negative. Urine culture contaminated, do not feel the need to repeat at this time as patient is asymptomatic. Awaiting blood cultures. Known sick contacts, flu positive evidenced by RVP and flu panel, explaining current symptoms. Primary hematologist, Dr. Lebron Conners aware of admission and will plan to see today. Will likely need outpatient hepatology follow up. - continue Vanc/Cefepime (2/5 - ) - blood and sputum cultures pending - RVP/flu panel - influenza A + - droplet precautions - monitor CBC, BMP - awaiting HIV, CMV - PT/OT - no follow up - tylenol PRN - zorfran PRN - tessalon for cough - f/u heme/onc  recommendations  Hyperglycemia Glucose 321 on admission. No known history of diabetes. A1c this admission 9.1. CBG 325 last night but after eaten graham cracker and regular soda. Not eating much right now, deferred insulin therapy at that time. 261 this am, started sSSI. - CBG monitoring - sensitive SSI due to insulin naive  Hypothyroidism TSH this admission 10.886, free T4 0.59. Takes Synthroid 252mg at home with good compliance. - continue home synthroid. - consider titrating dose as needed outpatient once over acute illness  Asthma vs. Restrictive Lung Disease Last PFT in 11/2016 with FEV1 1.54, FVC 1.8, with ratio 85. [facility] indicating air trapping, lung volumes are reduced, and did not show significant response to bronchodilators indicating a restrictive process. Takes Flonase, albuterol, Zyrtec, Symbicort at home. Lungs without wheezing/rales/rhonchi this morning. - continue home meds per formulary  GERD Denies active symptoms. Possible eosinophilic esophagitis on EGD in 2015, dilated lower esophageal ring in lower 1/3 of esophagus. Takes Protonix at home - continue home meds  H/o seizures Last seizure in 1970s on chart review and previously treated with phenobarbital. Does not require chronic prophylaxis. - monitor  Depression Previously treated with Elavil but hasn't taken since 2013.  - Monitor  FEN/GI: Heart Healthy/Carb modified Prophylaxis: SCDs due to thrombocytopenia  Disposition: continue inpatient management of leukopenia, flu  Subjective:  Patient feeling better today, still endorsing persistent cough. Discussed new diabetes diagnosis and what that means for long term management. No questions at this time.  Objective: Temp:  [98.3 F (36.8 C)-99.5 F (37.5 C)] 98.3 F (36.8 C) (02/06 0614) Pulse Rate:  [74-90] 74 (02/06 0614) Resp:  [18-24] 18 (02/06 0614) BP: (102-128)/(65-70) 128/70 (02/06  0614) SpO2:  [90 %-97 %]  97 % (02/06 0843) Weight:  [246 lb 4.1 oz (111.7 kg)] 246 lb 4.1 oz (111.7 kg) (02/05 2300) Physical Exam: General: obese female in mild distress, persistent coughing, improved from yesterday Cardiovascular: RRR, no murmur/rubs/gallops Respiratory: CTAB, no wheezes/rales/rhonchi. Persistent coughing, not productive at this time Abdomen: soft, obese abdomen, +BS, nontender to palpation  Extremities: warm and well perfused  Laboratory: Recent Labs  Lab 06/14/17 1629 06/16/17 0537  WBC 0.8* 1.1*  HGB 10.8* 10.1*  HCT 31.6* 30.1*  PLT 48* 49*   Recent Labs  Lab 06/14/17 1629 06/16/17 0537  NA 131* 134*  K 3.6 3.6  CL 102 104  CO2 21* 20*  BUN 6 5*  CREATININE 0.71 0.64  CALCIUM 8.5* 8.2*  PROT 7.8  --   BILITOT 2.8*  --   ALKPHOS 160*  --   ALT 54  --   AST 116*  --   GLUCOSE 321* 280*    Imaging/Diagnostic Tests: Dg Chest 2 View  Result Date: 06/14/2017 CLINICAL DATA:  Productive cough. EXAM: CHEST  2 VIEW COMPARISON:  Radiographs of April 05, 2017. FINDINGS: The heart size and mediastinal contours are within normal limits. Hypoinflation of the lungs is noted with mild bibasilar subsegmental atelectasis. No pneumothorax or pleural effusion is noted. The visualized skeletal structures are unremarkable. IMPRESSION: Hypoinflation of lungs with mild bibasilar subsegmental atelectasis. Electronically Signed   By: Marijo Conception, M.D.   On: 06/14/2017 17:05   US Abdomen Complete  Result Date: 06/15/2017 CLINICAL DATA:  Neutropenic fever EXAM: ABDOMEN ULTRASOUND COMPLETE COMPARISON:  CT 05/26/2012 FINDINGS: Gallbladder: No gallstones or wall thickening visualized. No sonographic Murphy sign noted by sonographer. Common bile duct: Diameter: 3.4 mm Liver: Enlarged liver measuring up to 20.5 cm. Coarse heterogeneous echotexture with nodular contour. No focal hepatic abnormality. Portal vein is patent on color Doppler imaging. There is bidirectional flow.  IVC: No abnormality visualized. Pancreas: Visualized portion unremarkable. Spleen: Enlarged measuring up to 20.5 cm. Right Kidney: Length: 14.6 cm. Echogenicity within normal limits. No mass or hydronephrosis visualized. Left Kidney: Length: 14.7 cm. Echogenicity within normal limits. No mass or hydronephrosis visualized. Abdominal aorta: No aneurysm visualized. Unable to visualize distal segment due to gas. Maximum AP diameter of 2.6 cm. Other findings: None. IMPRESSION: 1. Negative for gallstones or biliary dilatation 2. Enlarged coarse heterogeneous nodular liver, consistent with hepatocellular disease/suspected cirrhosis. Bidirectional flow in the portal vein suggesting portal hypertension 3. Splenomegaly 4. Maximum AP diameter of abdominal aorta measuring 2.6 cm. Ectatic abdominal aorta at risk for aneurysm development. Recommend followup by ultrasound in 5 years. This recommendation follows ACR consensus guidelines: White Paper of the ACR Incidental Findings Committee II on Vascular Findings. J Am Coll Radiol 2013; 10:789-794. Electronically Signed   By: Donavan Foil M.D.   On: 06/15/2017 03:20   Rory Percy, DO 06/16/2017, 9:05 AM PGY-1, [place] Intern pager: [phone], text pages welcome

## 2017-06-15 NOTE — Discharge Summary (Signed)
[facility] Discharge Summary  Patient name: Holly Mueller Medical record number: [id number] Date of birth: [birth year] Age: 52 y.o. Gender: female Date of Admission: 06/14/2017  Date of Discharge: 06/19/2017 Admitting Physician: Dickie La, MD  Primary Care Provider: [name], MD Consultants: Hematolgy/Oncology, GI  Indication for Hospitalization: Neutropenic Fever  Discharge Diagnoses/Problem List:  Neutropenic Fever d/t Flu New Onset Diabetes Hypothyroidism Hypotension, resolved Asthma vs. Restrictive Lung Disease GERD, stable Depression, stable  Disposition: home  Discharge Condition: Improved  Discharge Exam:  General: obese female in NAD, intermittent coughing, improved from yesterday. Clinically looks much improved from yesterday. Cardiovascular: RRR, no murmur/rubs/gallops Respiratory: CTAB, bibasilar crackles increased from yesterday. Intermittent coughing, not productive at this time Abdomen: soft, obese abdomen, +BS, nontender to palpation  Extremities: warm and well perfused  Brief Hospital Course:  Holly Mueller is a 52 y.o. female with PMH significant for chronic leukopenia and thromobcytopenia, hx of asthma, hx of seizures, depression, hypothyroidism, OA, GERD who presented with fever, productive cough, sore throat x3 days consistent with neutropenic fever meeting sepsis criteria. In the ED, patient received 1L NS bolus, had blood and urine cultures as well as RVP drawn and started on Cefepime and Tamiflu. CXR showed bibasilar atelectasis with no effusions or consolidation noted, although difficult to view due to hypoinflation. WBC 0.8. RVP resulted positive for Influenza A and continued on Tamiflu throughout this admission for total of 5 days.   Patient has history of chronic neutropenia and thrombocytopenia for the last 4-5 years, with extensive outpatient Heme/Onc workup including bone marrow biopsy negative for  myelodysplasia or leukemia. Also has chronically elevated LFTs with liver biopsy in 2014 consistent with cirrhosis. PT/INR with elevated PT 16.8.  Abdominal US this admission revealed enlarged liver and spleen. Nodular contour of liver seen consistent with cirrhosis. Unclear etiology of cirrhosis as patient has previous negative hepatitis panel and no h/o alcohol abuse, likely due to fatty liver. Patient's hematologist, Dr. Lebron Conners evaluated this admission who started a colony stimulating factor to increase neutrophil counts and will follow outpatient. WBC increased to 2.6 prior to discharge. GI also consulted this admission who ordered autoimmune labs and will follow outpatient.  Patient also with elevated glucose on admission, A1c 9.1 this admission. Blood sugars managed with sensitive sliding scale insulin, will start Metformin on discharge. Patient's TSH checked this admission and elevated with free T4 slightly below normal. Likely due to acute illness, recommend recheck once over acute illness and titrating dose as needed.  Issues for Follow Up:  1. Medication Changes 1. Completed Tamiflu x5 days while admitted 2. Started Metformin 586m daily, titrate as needed. 2. On chart review, patient appears to have persistent hematurina, will likely need urology f/u outpt 3. Patient will f/u with GI outpatient for cirrhosis.  4. Patient will f/u with Heme/Onc outpatient for chronic neutropenia. 5. Recheck TSH once over acute illness, titrate as needed  Significant Procedures: None  Significant Labs and Imaging:  Recent Labs  Lab 06/17/17 0453 06/18/17 0537 06/19/17 0403  WBC 1.5* 2.3* 2.6*  HGB 10.7* 10.6* 10.6*  HCT 31.7* 31.7* 32.0*  PLT 52* 51* 49*   Recent Labs  Lab 06/14/17 1629 06/16/17 0537 06/17/17 0453 06/18/17 0537  NA 131* 134* 139 137  K 3.6 3.6 3.5 3.2*  CL 102 104 108 108  CO2 21* 20* 22 20*  GLUCOSE 321* 280* 157* 137*  BUN 6 5* 6 8  CREATININE 0.71 0.64 0.58 0.60   CALCIUM  8.5* 8.2* 8.1* 8.5*  ALKPHOS 160*  --   --   --   AST 116*  --   --   --   ALT 54  --   --   --   ALBUMIN 2.9*  --   --   --    2/4 CXR FINDINGS: The heart size and mediastinal contours are within normal limits. Hypoinflation of the lungs is noted with mild bibasilar subsegmental atelectasis. No pneumothorax or pleural effusion is noted. The visualized skeletal structures are unremarkable. IMPRESSION: Hypoinflation of lungs with mild bibasilar subsegmental atelectasis.  2/5 US Abdomen FINDINGS: Gallbladder: No gallstones or wall thickening visualized. No sonographic Murphy sign noted by sonographer. Common bile duct: Diameter: 3.4 mm Liver: Enlarged liver measuring up to 20.5 cm. Coarse heterogeneous echotexture with nodular contour. No focal hepatic abnormality. Portal vein is patent on color Doppler imaging. There is bidirectional flow. IVC: No abnormality visualized. Pancreas: Visualized portion unremarkable. Spleen: Enlarged measuring up to 20.5 cm. Right Kidney: Length: 14.6 cm. Echogenicity within normal limits. No mass or hydronephrosis visualized. Left Kidney: Length: 14.7 cm. Echogenicity within normal limits. No mass or hydronephrosis visualized. Abdominal aorta: No aneurysm visualized. Unable to visualize distal segment due to gas. Maximum AP diameter of 2.6 cm. Other findings: None. IMPRESSION: 1. Negative for gallstones or biliary dilatation 2. Enlarged coarse heterogeneous nodular liver, consistent with hepatocellular disease/suspected cirrhosis. Bidirectional flow in the portal vein suggesting portal hypertension 3. Splenomegaly 4. Maximum AP diameter of abdominal aorta measuring 2.6 cm. Ectatic abdominal aorta at risk for aneurysm development. Recommend followup by ultrasound in 5 years.  2/7 CXR FINDINGS: Normal cardiac silhouette. Fine interstitial linear lung pattern. No change from prior. Low lung volumes. No consolidation. No pneumothorax  or pleural fluid. IMPRESSION: 1. No significant change. 2. Fine interstitial pattern suggests edema.  Results/Tests Pending at Time of Discharge: None  Discharge Medications:  Allergies as of 06/19/2017      Reactions   Aspirin Nausea Only   Upset stomach      Medication List    TAKE these medications   albuterol (2.5 MG/3ML) 0.083% nebulizer solution Commonly known as:  PROVENTIL Take 3 mLs (2.5 mg total) by nebulization every 6 (six) hours as needed for wheezing or shortness of breath.   albuterol 108 (90 Base) MCG/ACT inhaler Commonly known as:  PROAIR HFA INHALE 2 PUFFS INTO THE LUNGS EVERY 6 HOURS AS NEEDED FOR SHORTNESS OF BREATH   benzonatate 100 MG capsule Commonly known as:  TESSALON Take 1 capsule (100 mg total) by mouth 3 (three) times daily as needed for cough. What changed:  when to take this   blood glucose meter kit and supplies Kit Dispense based on patient and insurance preference. Use up to four times daily as directed. (FOR ICD-9 250.00, 250.01).   budesonide-formoterol 160-4.5 MCG/ACT inhaler Commonly known as:  SYMBICORT Inhale 2 puffs into the lungs 2 (two) times daily.   cetirizine 10 MG tablet Commonly known as:  ZYRTEC Take 1 tablet (10 mg total) by mouth daily.   fluticasone 50 MCG/ACT nasal spray Commonly known as:  FLONASE Place 2 sprays into both nostrils daily.   levothyroxine 125 MCG tablet Commonly known as:  SYNTHROID, LEVOTHROID Take 2 tablets (250 mcg total) by mouth daily.   metFORMIN 500 MG tablet Commonly known as:  GLUCOPHAGE Take 1 tablet (500 mg total) by mouth daily with breakfast.   pantoprazole 40 MG tablet Commonly known as:  PROTONIX Take 1 tablet (40 mg  total) by mouth daily.   promethazine-dextromethorphan 6.25-15 MG/5ML syrup Commonly known as:  PROMETHAZINE-DM Take 5 mLs by mouth 4 (four) times daily as needed for cough.            Durable Medical Equipment  (From admission, onward)        Start      Ordered   06/19/17 1637  DME Glucometer  Once    Comments:  Needs glucometer covered by Medicaid (Accu Check), please provide supplies   06/19/17 1637   06/16/17 1003  For home use only DME Shower stool  Once     06/16/17 1002   06/15/17 2152  For home use only DME Shower stool  Once     06/15/17 2151      Discharge Instructions: Please refer to Patient Instructions section of EMR for full details.  Patient was counseled important signs and symptoms that should prompt return to medical care, changes in medications, dietary instructions, activity restrictions, and follow up appointments.   Follow-Up Appointments: [place] Follow up.   Why:  Arranged for shower chair to be delivered prior to discharge Contact information: [place] [address] [phone]        [facility] Follow up on 06/24/2017.   Why:  @ 8:15am Contact information: [place]          Rory Percy, DO 06/19/2017, 5:44 PM PGY-1, [place]

## 2017-06-15 NOTE — Progress Notes (Signed)
Admission note:  Arrival Method: Patient arrived in bed accompanied by the staff from ED. Mental Orientation:  Alert and oriented x 4. Telemetry: N/A Assessment: See doc flow sheets. Skin: Warm, dry and intact. IV: Right forearm infusing ABT. Pain: Denies any pain. Tubes: N/A Safety Measures: Bed in low position, call bell and phone within reach. Fall Prevention Safety Plan: Reviewed the plan, verbalized understanding. Admission Screening: In progress. 6700 Orientation: Patient has been oriented to the unit, staff and to the room.

## 2017-06-15 NOTE — ED Notes (Signed)
Called Lab and they stated they do have the patient's labs and they will begin running them.

## 2017-06-15 NOTE — Progress Notes (Signed)
Inpatient Diabetes Program Recommendations  AACE/ADA: New Consensus Statement on Inpatient Glycemic Control (2015)  Target Ranges:  Prepandial:   less than 140 mg/dL      Peak postprandial:   less than 180 mg/dL (1-2 hours)      Critically ill patients:  140 - 180 mg/dL   Results for Holly Mueller, Holly Mueller (MRN [id number]) as of 06/15/2017 15:48  Ref. Range 06/15/2017 10:00  Hemoglobin A1C Latest Ref Range: 4.8 - 5.6 % 9.1 (H)   Review of Glycemic Control  Inpatient Diabetes Program Recommendations:    Admitting glucose 300's. A1c resulted 9.1%. Per ADA this meets criteria for New DM Diagnosis.  Please inform patient and consider Metformin 500 mg BID and PCP follow up and order Ambulatory referral for outpatient DM education reason New DM diagnosis.  Thanks, Tama Headings RN, MSN, [facility] Inpatient Diabetes Coordinator Team Pager [phone] (8a-5p)

## 2017-06-15 NOTE — Progress Notes (Signed)
Pharmacy Antibiotic Note  JESSIC STANDIFER is a 52 y.o. female admitted on 06/14/2017 with fever/cough/neutropenia.  Pharmacy has been consulted for Vancomycin dosing.  Plan: Vancomycin 2 g IV now, then 1 g IV q8h    Temp (24hrs), Avg:100.1 F (37.8 C), Min:99.7 F (37.6 C), Max:100.5 F (38.1 C)  Recent Labs  Lab 06/14/17 1629 06/14/17 1659  WBC 0.8*  --   CREATININE 0.71  --   LATICACIDVEN  --  1.69    CrCl cannot be calculated (Unknown ideal weight.).    Allergies  Allergen Reactions  . Aspirin Nausea Only    Upset stomach    Caryl Pina 06/15/2017 12:46 AM

## 2017-06-15 NOTE — Evaluation (Signed)
Physical Therapy Evaluation Patient Details Name: Holly Mueller MRN: [id number] DOB: 11/17/1965 Today's Date: 06/15/2017   History of Present Illness  52 y.o. female presenting with fever, productive cough, sore throat x3 days. PMH is significant for chronic leukopenia and thromobcytopenia, hx of asthma, hx of seizures, depression, hypothyroidism, OA, GERD.    Clinical Impression  Pt admitted secondary to problem above with deficits below. Pt fatiguing easily during gait and required standing rests. Pt also with increased coughing at end of gait and required seated rest and increased time to recover. Oxygen sats at 90% on RA. Required supervision to min guard for mobility. Feel pt may benefit from use of rollator from energy conservation standpoint and will need to educate about use in next session. Will continue to follow acutely to maximize functional mobility independence and safety.     Follow Up Recommendations No PT follow up;Supervision - Intermittent    Equipment Recommendations  Other (comment)(rollator )    Recommendations for Other Services       Precautions / Restrictions Precautions Precautions: None Restrictions Weight Bearing Restrictions: No      Mobility  Bed Mobility Overal bed mobility: Modified Independent             General bed mobility comments: Sitting EOB upon entry  Transfers Overall transfer level: Needs assistance Equipment used: None Transfers: Sit to/from Stand Sit to Stand: Supervision         General transfer comment: for safety, no physical assist or unsteadiness noted  Ambulation/Gait Ambulation/Gait assistance: Supervision;Min guard Ambulation Distance (Feet): 100 Feet Assistive device: None Gait Pattern/deviations: Step-through pattern;Decreased stride length Gait velocity: Decreased  Gait velocity interpretation: Below normal speed for age/gender General Gait Details: Overall steady gait with dynamic gait tasks, however,  requiring mostly supervision until end distance. Pt fatiguing easily and requried standing rest in the middle of gait training. At end of gait, pt with coughing spell and required min guard to ensure safe return to EOB. Increased SOB noted with coughing and required prolonged period and cues for pursed lip breathing to decrease symptoms. Oxygen sats at 90% on RA.   Stairs            Wheelchair Mobility    Modified Rankin (Stroke Patients Only)       Balance Overall balance assessment: Needs assistance Sitting-balance support: No upper extremity supported;Feet supported Sitting balance-Leahy Scale: Good     Standing balance support: No upper extremity supported;During functional activity Standing balance-Leahy Scale: Fair                   Standardized Balance Assessment Standardized Balance Assessment : Dynamic Gait Index   Dynamic Gait Index Level Surface: Mild Impairment Gait with Horizontal Head Turns: Mild Impairment Gait with Vertical Head Turns: Mild Impairment Gait and Pivot Turn: Mild Impairment Step Around Obstacles: Mild Impairment       Pertinent Vitals/Pain Pain Assessment: No/denies pain    Home Living Family/patient expects to be discharged to:: Private residence Living Arrangements: Children;Non-relatives/Friends Available Help at Discharge: Family;Friend(s);Available 24 hours/day Type of Home: House Home Access: Stairs to enter Entrance Stairs-Rails: None Entrance Stairs-Number of Steps: 2 Home Layout: One level Home Equipment: None      Prior Function Level of Independence: Independent         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[employer], drilling   Dominant Hand: Right    Extremity/Trunk Assessment   Upper Extremity Assessment Upper Extremity Assessment: Defer to  OT evaluation    Lower Extremity Assessment Lower Extremity Assessment: Overall WFL for tasks assessed    Cervical / Trunk Assessment Cervical / Trunk  Assessment: Normal  Communication   Communication: No difficulties  Cognition Arousal/Alertness: Awake/alert Behavior During Therapy: WFL for tasks assessed/performed Overall Cognitive Status: Within Functional Limits for tasks assessed                                        General Comments      Exercises     Assessment/Plan    PT Assessment Patient needs continued PT services  PT Problem List Cardiopulmonary status limiting activity;Decreased activity tolerance;Decreased knowledge of use of DME;Decreased mobility       PT Treatment Interventions DME instruction;Gait training;Stair training;Functional mobility training;Therapeutic activities;Therapeutic exercise;Neuromuscular re-education;Balance training;Patient/family education    PT Goals (Current goals can be found in the Care Plan section)  Acute Rehab PT Goals Patient Stated Goal: stop coughing PT Goal Formulation: With patient Time For Goal Achievement: 06/29/17 Potential to Achieve Goals: Good    Frequency Min 3X/week   Barriers to discharge        Co-evaluation               AM-PAC PT "6 Clicks" Daily Activity  Outcome Measure Difficulty turning over in bed (including adjusting bedclothes, sheets and blankets)?: None Difficulty moving from lying on back to sitting on the side of the bed? : A Little Difficulty sitting down on and standing up from a chair with arms (e.g., wheelchair, bedside commode, etc,.)?: A Little Help needed moving to and from a bed to chair (including a wheelchair)?: A Little Help needed walking in hospital room?: A Little Help needed climbing 3-5 steps with a railing? : A Lot 6 Click Score: 18    End of Session Equipment Utilized During Treatment: Gait belt Activity Tolerance: Treatment limited secondary to medical complications (Comment);Patient limited by fatigue(SOB; coughing ) Patient left: in bed;with call bell/phone within reach Nurse Communication:  Mobility status PT Visit Diagnosis: Other abnormalities of gait and mobility (R26.89)    Time: 1457-1520 PT Time Calculation (min) (ACUTE ONLY): 23 min   Charges:   PT Evaluation $PT Eval Low Complexity: 1 Low PT Treatments $Gait Training: 8-22 mins   PT G Codes:        Leighton Ruff, PT, DPT  Acute Rehabilitation Services  Pager: [phone]   Rudean Hitt 06/15/2017, 6:19 PM

## 2017-06-15 NOTE — ED Notes (Signed)
Jello and chicken broth ordered per pt.

## 2017-06-15 NOTE — ED Notes (Signed)
Patient transported to Ultrasound 

## 2017-06-15 NOTE — Evaluation (Signed)
Occupational Therapy Evaluation and Discharge Patient Details Name: Holly Mueller MRN: [id number] DOB: Oct 31, 1965 Today's Date: 06/15/2017    History of Present Illness 52 y.o. female presenting with fever, productive cough, sore throat x3 days. PMH is significant for chronic leukopenia and thromobcytopenia, hx of asthma, hx of seizures, depression, hypothyroidism, OA, GERD.     Clinical Impression   Pt reports she was independent with ADL PTA. Currently pt overall supervision for ADL and functional mobility. Educated pt on energy conservation strategies, pursed lip breathing, and home safety. Pt planning to d/c home with 24/7 supervision from family/friends. No further acute OT needs identified; signing off at this time. Please re-consult if needs change. Thank you for this referral.    Follow Up Recommendations  No OT follow up;Supervision - Intermittent    Equipment Recommendations  Tub/shower seat(or 3in1 to use as shower chair)    Recommendations for Other Services PT consult     Precautions / Restrictions Restrictions Weight Bearing Restrictions: No      Mobility Bed Mobility Overal bed mobility: Modified Independent             General bed mobility comments: HOB slightly elevated, increased effort  Transfers Overall transfer level: Needs assistance Equipment used: None Transfers: Sit to/from Stand Sit to Stand: Supervision         General transfer comment: for safety, no physical assist or unsteadiness noted    Balance Overall balance assessment: No apparent balance deficits (not formally assessed)                                         ADL either performed or assessed with clinical judgement   ADL Overall ADL's : Needs assistance/impaired Eating/Feeding: Independent;Sitting   Grooming: Supervision/safety;Standing   Upper Body Bathing: Set up;Sitting   Lower Body Bathing: Supervison/ safety;Sit to/from stand   Upper Body  Dressing : Set up;Sitting   Lower Body Dressing: Supervision/safety;Sit to/from stand   Toilet Transfer: Supervision/safety;Ambulation;Regular Toilet       Tub/ Banker: Supervision/safety;Tub transfer;Ambulation Tub/Shower Transfer Details (indicate cue type and reason): Educated on use of 3 in 1 or shower chair for safety and energy conservation during bathing; pt verbalized understanding Functional mobility during ADLs: Supervision/safety General ADL Comments: Educated pt on energy conservation strategies and pursed lip breathing.     Vision         Perception     Praxis      Pertinent Vitals/Pain Pain Assessment: No/denies pain     Hand Dominance Right   Extremity/Trunk Assessment Upper Extremity Assessment Upper Extremity Assessment: Overall WFL for tasks assessed   Lower Extremity Assessment Lower Extremity Assessment: Defer to PT evaluation   Cervical / Trunk Assessment Cervical / Trunk Assessment: Normal   Communication Communication Communication: No difficulties   Cognition Arousal/Alertness: Awake/alert Behavior During Therapy: WFL for tasks assessed/performed Overall Cognitive Status: Within Functional Limits for tasks assessed                                     General Comments       Exercises     Shoulder Instructions      Home Living Family/patient expects to be discharged to:: Private residence Living Arrangements: Children;Non-relatives/Friends(2 teenage sons and roommate) Available Help at Discharge: Family;Friend(s);Available 24 hours/day Type of Home:  House Home Access: Stairs to enter Technical brewer of Steps: 2   Home Layout: One level     Bathroom Shower/Tub: Teacher, early years/pre: Standard     Home Equipment: None          Prior Functioning/Environment Level of Independence: Independent        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 Problem List:        OT  Treatment/Interventions:      OT Goals(Current goals can be found in the care plan section) Acute Rehab OT Goals Patient Stated Goal: stop coughing OT Goal Formulation: All assessment and education complete, DC therapy  OT Frequency:     Barriers to D/C:            Co-evaluation              AM-PAC PT "6 Clicks" Daily Activity     Outcome Measure Help from another person eating meals?: None Help from another person taking care of personal grooming?: A Little Help from another person toileting, which includes using toliet, bedpan, or urinal?: A Little Help from another person bathing (including washing, rinsing, drying)?: A Little Help from another person to put on and taking off regular upper body clothing?: None Help from another person to put on and taking off regular lower body clothing?: A Little 6 Click Score: 20   End of Session    Activity Tolerance: Patient tolerated treatment well Patient left: in bed;with call bell/phone within reach  OT Visit Diagnosis: Unsteadiness on feet (R26.81)                Time: 1117-3567 OT Time Calculation (min): 18 min Charges:  OT General Charges $OT Visit: 1 Visit OT Evaluation $OT Eval Low Complexity: 1 Low G-Codes:     Holly Mueller, M.S., OTR/L Pager: [place] 06/15/2017, 2:43 PM

## 2017-06-15 NOTE — ED Notes (Signed)
Pt switched to hospital bed

## 2017-06-15 NOTE — Progress Notes (Signed)
Family Medicine Teaching Service Daily Progress Note Intern Pager: [phone]  Patient name: Holly Mueller Medical record number: [id number] Date of birth: 1965/05/24 Age: 52 y.o. Gender: female  Primary Care Provider: [name], MD Consultants: None Code Status: Full  Pt Overview and Major Events to Date:  2/5 - admitted for neutropenic fever  Assessment and Plan: FREEDA SPIVEY is a 52 y.o. female presenting with fever, productive cough, sore throat x3 days. PMH is significant for chronic leukopenia and thromobcytopenia, hx of asthma, hx of seizures, depression, hypothyroidism, OA, GERD.    Neutropenic Fever w/ h/o neutropenia, thrombocytopenia, ?h/o cirrhosis Presented meeting sepsis criteria with tachycardia 102, fever 100.5, neutropenia (WBC 0.8, neutrophil 52) but without lactic acidosis (1.69). qSOFA 0 indicating low risk. S/p 1L NS bolus. CXR showed bibasilar atelectasis with no effusions or consolidation noted, although difficult to view due to hypoinflation. Has h/o chronic neutropenia for the last 4-5 years, with extensive outpatient Heme/Onc workup including bone marrow biopsy negative for myelodysplasia or leukemia. Also with chronic thrombocytopenia. Has chronically elevated LFTs with liver biopsy consistent with cirrhosis. PT/INR with elevated PT 16.8.  Abdominal US this morning revealed enlarged liver and spleen. Nodular contour of liver seen consistent with cirrhosis. Recommend f/u US in 5 years. Unclear etiology of cirrhosis as patient has previous negative hepatitis panel and no h/o alcohol abuse, likely due to fatty liver. Known sick contacts, flu positive evidenced by RVP and flu panel. Left message with Dr. Clydene Laming nurse to inform him of patient's admission and ask for further recommendations. - continue Vanc/Cefepime (2/5 - ) - blood, urine cultures pending - obtain sputum culture - RVP/flu panel - influenza A + - droplet precautions - am CBC, BMP - obtain  HIV, EBV, CMV - PT/OT - tylenol PRN - zorfran PRN - peripheral smear with path review - tessalon for cough - f/u heme/onc recommendations  Hyperglycemia Glucose 321 on admission. No known history of diabetes. Last A1c 5.1 in 2010. - obtain A1c  Hypothyroidism Last TSH 0.081 in 11/2016. Takes Synthroid 259mg at home. - continue home synthroid - TSH  Asthma vs. Restrictive Lung Disease Last PFT in 11/2016 with FEV1 1.54, FVC 1.8, with ratio 85. PFT report states that the [facility]is reduced relative to the SVC indicating air trapping, lung volumes are reduced, and did not show significant response to bronchodilators indicating a restrictive process. Takes Flonase, albuterol, Zyrtec, Symbicort at home. - continue home meds per formulary  GERD Denies active symptoms. Possible eosinophilic esophagitis on EGD in 2015, dilated lower esophageal ring in lower 1/3 of esophagus. Takes Protonix at home - continue home meds  H/o seizures Last seizure in 1970s on chart review and previously treated with phenobarbital. Does not require chronic prophylaxis. - monitor  Depression Previously treated with Elavil but hasn't taken since 2013.  - Monitor  FEN/GI: Heart Healthy Prophylaxis: SCDs due to thrombocytopenia  Disposition: continue inpatient management of leukopenia, flu  Subjective:  Patient with worsened cough and pain with coughing. Also endorsing headache with cough. Started having diarrhea this morning. Endorsing diffuse myalgias.  Objective: Temp:  [99.6 F (37.6 C)-102.2 F (39 C)] 99.6 F (37.6 C) (02/05 0417) Pulse Rate:  [85-109] 92 (02/05 0747) Resp:  [16-29] 19 (02/05 0747) BP: (101-148)/(60-77) 110/63 (02/05 0747) SpO2:  [93 %-100 %] 94 % (02/05 0747) Physical Exam: General: obese female in moderate distress, persistent coughing Cardiovascular: RRR, no murmur/rubs/gallops Respiratory: CTAB, no wheezes/rales/rhonchi. Persistent coughing Abdomen: soft, obese  abdomen, +BS, tender to  palpation diffusely Extremities: warm and well perfused  Laboratory: Recent Labs  Lab 06/14/17 1629  WBC 0.8*  HGB 10.8*  HCT 31.6*  PLT 48*   Recent Labs  Lab 06/14/17 1629  NA 131*  K 3.6  CL 102  CO2 21*  BUN 6  CREATININE 0.71  CALCIUM 8.5*  PROT 7.8  BILITOT 2.8*  ALKPHOS 160*  ALT 54  AST 116*  GLUCOSE 321*    Imaging/Diagnostic Tests: Dg Chest 2 View  Result Date: 06/14/2017 CLINICAL DATA:  Productive cough. EXAM: CHEST  2 VIEW COMPARISON:  Radiographs of April 05, 2017. FINDINGS: The heart size and mediastinal contours are within normal limits. Hypoinflation of the lungs is noted with mild bibasilar subsegmental atelectasis. No pneumothorax or pleural effusion is noted. The visualized skeletal structures are unremarkable. IMPRESSION: Hypoinflation of lungs with mild bibasilar subsegmental atelectasis. Electronically Signed   By: Marijo Conception, M.D.   On: 06/14/2017 17:05   US Abdomen Complete  Result Date: 06/15/2017 CLINICAL DATA:  Neutropenic fever EXAM: ABDOMEN ULTRASOUND COMPLETE COMPARISON:  CT 05/26/2012 FINDINGS: Gallbladder: No gallstones or wall thickening visualized. No sonographic Murphy sign noted by sonographer. Common bile duct: Diameter: 3.4 mm Liver: Enlarged liver measuring up to 20.5 cm. Coarse heterogeneous echotexture with nodular contour. No focal hepatic abnormality. Portal vein is patent on color Doppler imaging. There is bidirectional flow. IVC: No abnormality visualized. Pancreas: Visualized portion unremarkable. Spleen: Enlarged measuring up to 20.5 cm. Right Kidney: Length: 14.6 cm. Echogenicity within normal limits. No mass or hydronephrosis visualized. Left Kidney: Length: 14.7 cm. Echogenicity within normal limits. No mass or hydronephrosis visualized. Abdominal aorta: No aneurysm visualized. Unable to visualize distal segment due to gas. Maximum AP diameter of 2.6 cm. Other findings: None. IMPRESSION: 1. Negative  for gallstones or biliary dilatation 2. Enlarged coarse heterogeneous nodular liver, consistent with hepatocellular disease/suspected cirrhosis. Bidirectional flow in the portal vein suggesting portal hypertension 3. Splenomegaly 4. Maximum AP diameter of abdominal aorta measuring 2.6 cm. Ectatic abdominal aorta at risk for aneurysm development. Recommend followup by ultrasound in 5 years. This recommendation follows ACR consensus guidelines: White Paper of the ACR Incidental Findings Committee II on Vascular Findings. J Am Coll Radiol 2013; 10:789-794. Electronically Signed   By: Donavan Foil M.D.   On: 06/15/2017 03:20   Rory Percy, DO 06/15/2017, 8:16 AM PGY-1, [place] Intern pager: [phone], text pages welcome

## 2017-06-16 DIAGNOSIS — E119 Type 2 diabetes mellitus without complications: Secondary | ICD-10-CM

## 2017-06-16 DIAGNOSIS — E039 Hypothyroidism, unspecified: Secondary | ICD-10-CM

## 2017-06-16 DIAGNOSIS — R5081 Fever presenting with conditions classified elsewhere: Secondary | ICD-10-CM

## 2017-06-16 DIAGNOSIS — D709 Neutropenia, unspecified: Secondary | ICD-10-CM

## 2017-06-16 DIAGNOSIS — R69 Illness, unspecified: Secondary | ICD-10-CM

## 2017-06-16 DIAGNOSIS — J101 Influenza due to other identified influenza virus with other respiratory manifestations: Secondary | ICD-10-CM

## 2017-06-16 DIAGNOSIS — D72819 Decreased white blood cell count, unspecified: Secondary | ICD-10-CM

## 2017-06-16 LAB — BASIC METABOLIC PANEL
Anion gap: 10 (ref 5–15)
BUN: 5 mg/dL — AB (ref 6–20)
CHLORIDE: 104 mmol/L (ref 101–111)
CO2: 20 mmol/L — ABNORMAL LOW (ref 22–32)
CREATININE: 0.64 mg/dL (ref 0.44–1.00)
Calcium: 8.2 mg/dL — ABNORMAL LOW (ref 8.9–10.3)
GFR calc Af Amer: >60 mL/min (ref >60)
GFR calc non Af Amer: >60 mL/min (ref >60)
Glucose, Bld: 280 mg/dL — ABNORMAL HIGH (ref 65–99)
Potassium: 3.6 mmol/L (ref 3.5–5.1)
Sodium: 134 mmol/L — ABNORMAL LOW (ref 135–145)

## 2017-06-16 LAB — URINE CULTURE

## 2017-06-16 LAB — CBC
HCT: 30.1 % — ABNORMAL LOW (ref 36.0–46.0)
HEMOGLOBIN: 10.1 g/dL — AB (ref 12.0–15.0)
MCH: 28.8 pg (ref 26.0–34.0)
MCHC: 33.6 g/dL (ref 30.0–36.0)
MCV: 85.8 fL (ref 78.0–100.0)
Platelets: 49 10*3/uL — ABNORMAL LOW (ref 150–400)
RBC: 3.51 MIL/uL — ABNORMAL LOW (ref 3.87–5.11)
RDW: 14.3 % (ref 11.5–15.5)
WBC: 1.1 10*3/uL — CL (ref 4.0–10.5)

## 2017-06-16 LAB — GLUCOSE, CAPILLARY
GLUCOSE-CAPILLARY: 352 mg/dL — AB (ref 65–99)
GLUCOSE-CAPILLARY: 232 mg/dL — AB (ref 65–99)
GLUCOSE-CAPILLARY: 192 mg/dL — AB (ref 65–99)
Glucose-Capillary: 261 mg/dL — ABNORMAL HIGH (ref 65–99)
Glucose-Capillary: 168 mg/dL — ABNORMAL HIGH (ref 65–99)

## 2017-06-16 LAB — CMV IGM: CMV IgM: <30 AU/mL (ref 0.0–29.9)

## 2017-06-16 LAB — HIV ANTIBODY (ROUTINE TESTING W REFLEX): HIV Screen 4th Generation wRfx: NONREACTIVE

## 2017-06-16 MED ORDER — LIVING WELL WITH DIABETES BOOK
Freq: Once | Status: AC
  Administered 2017-06-16: 13:00:00
  Filled 2017-06-16: qty 1

## 2017-06-16 MED ORDER — TBO-FILGRASTIM 300 MCG/0.5ML ~~LOC~~ SOSY
300.0000 ug | PREFILLED_SYRINGE | Freq: Once | SUBCUTANEOUS | Status: AC
  Administered 2017-06-17: 300 ug via SUBCUTANEOUS
  Filled 2017-06-16: qty 0.5

## 2017-06-16 MED ORDER — INSULIN ASPART 100 UNIT/ML ~~LOC~~ SOLN
0.0000 [IU] | Freq: Three times a day (TID) | SUBCUTANEOUS | Status: DC
  Administered 2017-06-16: 2 [IU] via SUBCUTANEOUS
  Administered 2017-06-16: 3 [IU] via SUBCUTANEOUS
  Administered 2017-06-17: 1 [IU] via SUBCUTANEOUS
  Administered 2017-06-17 – 2017-06-18 (×2): 2 [IU] via SUBCUTANEOUS
  Administered 2017-06-18: 1 [IU] via SUBCUTANEOUS
  Administered 2017-06-19: 2 [IU] via SUBCUTANEOUS

## 2017-06-16 NOTE — Progress Notes (Signed)
MD paged concerning patient's high cbg. Awaiting call back

## 2017-06-16 NOTE — Progress Notes (Signed)
No new medication for CBG result at this time. MD asked to monitor patient at this time.

## 2017-06-16 NOTE — Progress Notes (Signed)
Physical Therapy Treatment Patient Details Name: Holly Mueller MRN: [id number] DOB: [birth year] Today's Date: 06/16/2017    History of Present Illness Pt is a 52 y.o. female presenting with fever, productive cough, sore throat x3 days. Pt found positive for the flu. PMH is significant for chronic leukopenia and thromobcytopenia, hx of asthma, hx of seizures, depression, hypothyroidism, OA, GERD.      PT Comments    Pt making good progress with functional mobility. She tolerated ambulating a further distance this session with no physical assistance or rest breaks required. PT will continue to follow acutely to progress mobility as tolerated and to ensure a safe d/c home.   Follow Up Recommendations  No PT follow up;Supervision - Intermittent     Equipment Recommendations  None recommended by PT    Recommendations for Other Services       Precautions / Restrictions Precautions Precautions: None Restrictions Weight Bearing Restrictions: No    Mobility  Bed Mobility Overal bed mobility: Modified Independent                Transfers Overall transfer level: Needs assistance Equipment used: None Transfers: Sit to/from Stand Sit to Stand: Supervision         General transfer comment: for safety  Ambulation/Gait Ambulation/Gait assistance: Supervision Ambulation Distance (Feet): 150 Feet Assistive device: (pushed IV pole) Gait Pattern/deviations: Step-through pattern;Decreased stride length Gait velocity: Decreased  Gait velocity interpretation: Below normal speed for age/gender General Gait Details: no instability or LOB, supervision for safety with steady gait. Pt with preference for pushing IV pole; however, was not reliant on using it for support   Stairs            Wheelchair Mobility    Modified Rankin (Stroke Patients Only)       Balance Overall balance assessment: Needs assistance Sitting-balance support: No upper extremity supported;Feet  supported Sitting balance-Leahy Scale: Good     Standing balance support: During functional activity;No upper extremity supported Standing balance-Leahy Scale: Good                              Cognition Arousal/Alertness: Awake/alert Behavior During Therapy: WFL for tasks assessed/performed Overall Cognitive Status: Within Functional Limits for tasks assessed                                        Exercises      General Comments        Pertinent Vitals/Pain Pain Assessment: No/denies pain    Home Living                      Prior Function            PT Goals (current goals can now be found in the care plan section) Acute Rehab PT Goals PT Goal Formulation: With patient Time For Goal Achievement: 06/29/17 Potential to Achieve Goals: Good Progress towards PT goals: Progressing toward goals    Frequency    Min 3X/week      PT Plan Current plan remains appropriate    Co-evaluation              AM-PAC PT "6 Clicks" Daily Activity  Outcome Measure  Difficulty turning over in bed (including adjusting bedclothes, sheets and blankets)?: None Difficulty moving from lying on back to sitting on the side  of the bed? : None Difficulty sitting down on and standing up from a chair with arms (e.g., wheelchair, bedside commode, etc,.)?: None Help needed moving to and from a bed to chair (including a wheelchair)?: None Help needed walking in hospital room?: None Help needed climbing 3-5 steps with a railing? : A Little 6 Click Score: 23    End of Session   Activity Tolerance: Patient tolerated treatment well Patient left: in chair;with call bell/phone within reach Nurse Communication: Mobility status PT Visit Diagnosis: Other abnormalities of gait and mobility (R26.89)     Time: 8466-5993 PT Time Calculation (min) (ACUTE ONLY): 11 min  Charges:  $Gait Training: 8-22 mins                    G Codes:       [place], [place], DPT [place] 06/16/2017, 11:07 AM

## 2017-06-16 NOTE — Progress Notes (Signed)
CRITICAL VALUE ALERT  Critical Value:  WBC 1.1  Date & Time Notied:  06/16/2017 7:01am  Provider Notified: NP/MD paged  Orders Received/Actions taken: resident called back. She is aware of lab

## 2017-06-16 NOTE — Care Management Note (Addendum)
Case Management Note  Patient Details  Name: Holly Mueller MRN: [id number] Date of Birth: 01/17/1966  Subjective/Objective:    Admitted for neutropenic fever.  H/o chronic neutropenia and thrombocytopenia for the last 4-5 years, with liver biopsy 2014 and Abd [place] this admission consistent with cirrhosis. flu positive;Hyperglycemia Glucose 321 on admission. No known history of diabetes  Action/Plan: Presented meeting sepsis criteria, S/p 1L NS bolus.CXR showed bibasilar atelectasis with no effusions or consolidation noted. Previously with extensive outpatient Heme/Onc workup including bone marrow biopsy negative for myelodysplasia or leukemia. Peripheral smear this admission with pancytopenia but no atypia. PT/INR with elevated PT 16.8. Monospot negative continue Vanc/Cefepime;tessalon for cough;droplet precautions; awaiting HIV, CMV;f/u heme/onc recommendations. will likely be started on low dose Metformin on d/c.              Expected Discharge Plan:  Home/Self Care  In-House Referral:   n/a.    Discharge planning Services  CM Consult  Post Acute Care Choice:    Choice offered to:   Patient  DME Arranged:   Shower chair DME Agency:   [facility]  Status of Service:  In process, will continue to follow  If discussed at Long Length of Stay Meetings, dates discussed:    Additional Comments: n to speak with Patient.  rior to admission patient lived at home with roommate and 2 sons. Discussed recommendations for shower seat.  Patient agrees DME, offered choice of DME provider Patient selected [facility]. Referral called Butch Penny with [facility]. P Patient does cooking and shopping.  Medical appointments takes city Bus.  Denies ability to afford food and medications. PCP is [place].  Uses [facility].  NCM will continue to follow for discharge needs.  Kristen Cardinal, RN  Nurse Case Manager [phone] 06/16/2017, 9:46 AM

## 2017-06-16 NOTE — Consult Note (Signed)
New Hematology/Oncology Consult   Referral MD: Dr Rory Percy   Reason for Referral: Patient admitted with diagnosis of influenza confirmed by direct testing in the setting of neutropenic fever.  HPI:  Holly Mueller femaleis a 52 y.o. previously followed in my clinic for diagnosis of chronic leukopenia with neutropenia as well as thrombocytopenia attributed to diagnosis of hepatic cirrhosis of unknown etiology.  Patient's evaluation included extensive testing as well as bone marrow biopsy which showed no evidence of primary bone marrow process such as myelodysplasia or leukemia at that time.  Patient was last seen in my clinic on 12/15/16 with plans for follow-up in 1 month, but subsequently was lost to follow-up.  He does not appear that she followed up with gastroenterology either where she was referred for additional evaluation for her cirrhosis etiology.  Patient presented to the emergency room on 06/14/17 with complaints of fever, cough, and sore throat for at least 3 days.  In conversation with her today, cough has been present for probably more like 2 months at this time.  Patient has had previous bouts of viral illnesses due to sick contacts in the family.  This time, her son has been diagnosed with influenza and so was she after presenting to the emergency room.  Additionally, patient complained of lower chest and epigastric pain in transverse fashion wrapping around the body that she attributes to musculoskeletal strain from persistent cough.  Patient denies active shortness of breath.  She does have productive cough with greenish sputum.  Denies hemoptysis, hematochezia, or melena.  No significant sinus congestion or epistaxis at this time.  No urinary symptoms.  She does have some headaches, but no visual change, photophobia, or phonophobia.  On presentation, patient was started on parenteral hydration, Tamiflu, and was also started on cefepime and vancomycin.  During the course of  hospitalization, highest temperature recorded was 102.2 associated with tachycardia in the low 100s and no significant hypotension.  Over the past 24 hours, patient has been afebrile.  Previously noted tachypnea has improved.  Heart rate is stable around 80s and blood pressures are also significantly better with most recent blood pressure demonstrating 126/73.  Patient continues to complain of the cough and general malaise.  No new complaints since admission.  Admission CBC demonstrates total white blood cell count of 0.8 with neutrophil count of 0.5 and lymphocyte count of 0.2.  Platelets of 48 on admission.  White blood cell count is slightly better today at 1.1.  Platelets are stable at 49.       Past Medical History:  Diagnosis Date  . Arthritis    bursitis left hip flares-not an issue  . Asthma 2004  . Edentulous    10-19-13 at present  . Epilepsy ([place]) in 1972   No seizures since 1972. Previously treated with phenobarbital.   . Fever 06/2017  . GERD (gastroesophageal reflux disease) 2008  . Headache(784.0)    hx of migraines   . Hypothyroidism 2008  . Lower esophageal ring 08/18/2013  . Seasonal allergies 2003  . Shortness of breath   :  Past Surgical History:  Procedure Laterality Date  . BALLOON DILATION N/A 10/24/2013   Procedure: BALLOON DILATION;  Surgeon: Gatha Mayer, MD;  Location: [facility];  Service: Endoscopy;  Laterality: N/A;  . CESAREAN SECTION     x2  . DENTAL SURGERY     multiple extractions 3'15  . ESOPHAGOGASTRODUODENOSCOPY N/A 10/24/2013   Procedure: ESOPHAGOGASTRODUODENOSCOPY (EGD);  Surgeon: Gatha Mayer,  MD;  Location: [facility];  Service: Endoscopy;  Laterality: N/A;  . LIVER BIOPSY  06/30/2012   Procedure: LIVER BIOPSY;  Surgeon: Shann Medal, MD;  Location: [facility];  Service: General;;  . NOVASURE ABLATION    . SHOULDER ARTHROSCOPY Right 2011  . UMBILICAL HERNIA REPAIR N/A 06/30/2012   Procedure: remove umbilicus;  Surgeon: Shann Medal,  MD;  Location: [facility];  Service: General;  Laterality: N/A;  . VENTRAL HERNIA REPAIR N/A 06/30/2012   Procedure: LAPAROSCOPIC VENTRAL HERNIA;  Surgeon: Shann Medal, MD;  Location: [facility];  Service: General;  Laterality: N/A;  With Mesh  . WISDOM TOOTH EXTRACTION    :   Current Facility-Administered Medications:  .  acetaminophen (TYLENOL) tablet 650 mg, 650 mg, Oral, Q6H PRN, 650 mg at 06/15/17 1116 **OR** acetaminophen (TYLENOL) suppository 650 mg, 650 mg, Rectal, Q6H PRN, Rumball, Alison, DO .  albuterol (PROVENTIL) (2.5 MG/3ML) 0.083% nebulizer solution 2.5 mg, 2.5 mg, Nebulization, Q6H PRN, Rumball, Alison, DO .  benzonatate (TESSALON) capsule 200 mg, 200 mg, Oral, TID PRN, Bonnita Hollow, MD .  ceFEPIme (MAXIPIME) 2 g in dextrose 5 % 50 mL IVPB, 2 g, Intravenous, Q8H, Reginia Naas, RPH, Stopped at 06/16/17 0544 .  dextromethorphan-guaiFENesin (MUCINEX DM) 30-600 MG per 12 hr tablet 1 tablet, 1 tablet, Oral, BID, Bonnita Hollow, MD, 1 tablet at 06/16/17 0913 .  fluticasone (FLONASE) 50 MCG/ACT nasal spray 2 spray, 2 spray, Each Nare, Daily, Rumball, Alison, DO, 2 spray at 06/16/17 0914 .  Influenza vac split quadrivalent PF (FLUARIX) injection 0.5 mL, 0.5 mL, Intramuscular, Prior to discharge, Dickie La, MD .  insulin aspart (novoLOG) injection 0-9 Units, 0-9 Units, Subcutaneous, TID WC, Rumball, Alison, DO .  levothyroxine (SYNTHROID, LEVOTHROID) tablet 250 mcg, 250 mcg, Oral, QAC breakfast, Rory Percy, DO, 250 mcg at 06/16/17 0913 .  loratadine (CLARITIN) tablet 10 mg, 10 mg, Oral, Daily, Rumball, Alison, DO, 10 mg at 06/16/17 0914 .  mometasone-formoterol (DULERA) 200-5 MCG/ACT inhaler 2 puff, 2 puff, Inhalation, BID, Rumball, Alison, DO, 2 puff at 06/16/17 0843 .  ondansetron (ZOFRAN) tablet 4 mg, 4 mg, Oral, Q6H PRN **OR** ondansetron (ZOFRAN) injection 4 mg, 4 mg, Intravenous, Q6H PRN, Rumball, Alison, DO .  oseltamivir (TAMIFLU) capsule 75 mg, 75 mg, Oral, BID,  Rumball, Alison, DO, 75 mg at 06/16/17 0913 .  pantoprazole (PROTONIX) EC tablet 40 mg, 40 mg, Oral, Daily, Rumball, Alison, DO, 40 mg at 06/16/17 0914 .  polyethylene glycol (MIRALAX / GLYCOLAX) packet 17 g, 17 g, Oral, Daily PRN, Rumball, Alison, DO .  vancomycin (VANCOCIN) IVPB 1000 mg/200 mL premix, 1,000 mg, Intravenous, Q8H, Dickie La, MD, Stopped at 06/16/17 [phone]:  . dextromethorphan-guaiFENesin  1 tablet Oral BID  . fluticasone  2 spray Each Nare Daily  . insulin aspart  0-9 Units Subcutaneous TID WC  . levothyroxine  250 mcg Oral QAC breakfast  . loratadine  10 mg Oral Daily  . mometasone-formoterol  2 puff Inhalation BID  . oseltamivir  75 mg Oral BID  . pantoprazole  40 mg Oral Daily  :  Allergies  Allergen Reactions  . Aspirin Nausea Only    Upset stomach  :   Family History  Problem Relation Age of Onset  . Hypertension Mother   . Diabetes Mother   . Lung cancer Father   . Stroke Father   . Diabetes Sister   . Hypertension Sister    Social History  Socioeconomic History  . Marital status: Single    Spouse name: Not on file  . Number of children: 2  . Years of education: Not on file  . Highest education level: Not on file  Social Needs  . Financial resource strain: Not on file  . Food insecurity - worry: Not on file  . Food insecurity - inability: Not on file  . Transportation needs - medical: Not on file  . Transportation needs - non-medical: Not on file  Occupational History    Employer: [employer]  Tobacco Use  . Smoking status: Former Smoker    Packs/day: 0.25    Types: Cigarettes    Last attempt to quit: 1997    Years since quitting: 22.1  . Smokeless tobacco: Never Used  Substance and Sexual Activity  . Alcohol use: Yes    Alcohol/week: 0.0 oz    Comment: occ  . Drug use: No  . Sexual activity: No  Other Topics Concern  . Not on file  Social History Narrative   Works in Estate manager/land agent at [employer].    Lives with 12 and 14  (boys).    Also living with close friends of the family (mother and two kids, adults).          Review of Systems: As per HPI.  All other systems are negative.  Physical Exam:  Blood pressure 128/70, pulse 74, temperature 98.3 F (36.8 C), temperature source Oral, resp. rate 18, height _0  (1.676 m), weight 246 lb 4.1 oz (111.7 kg), SpO2 97 %. Patient is alert, awake, oriented x3.  Appears to be ill HEENT: Anicteric sclerae.  Moist mucous membranes, PERRLA, EOMI. Lungs: Coarse breath sounds bilaterally without expiratory wheezing. Cardiac: S1/S2, regular.  No murmurs, rubs, gallops. Abdomen: Obese, nontender.  Cannot appreciate hepatosplenomegaly due to habitus. Lymph nodes: No palpable lymphadenopathy Neurologic: No gross focal neurological deficits Musculoskeletal: Bilateral trace edema in the lower extremities  LABS:  Recent Labs    06/14/17 1629 06/16/17 0537  WBC 0.8* 1.1*  HGB 10.8* 10.1*  HCT 31.6* 30.1*  PLT 48* 49*    Recent Labs    06/14/17 1629 06/16/17 0537  NA 131* 134*  K 3.6 3.6  CL 102 104  CO2 21* 20*  GLUCOSE 321* 280*  BUN 6 5*  CREATININE 0.71 0.64  CALCIUM 8.5* 8.2*      RADIOLOGY:  Dg Chest 2 View  Result Date: 06/14/2017 CLINICAL DATA:  Productive cough. EXAM: CHEST  2 VIEW COMPARISON:  Radiographs of April 05, 2017. FINDINGS: The heart size and mediastinal contours are within normal limits. Hypoinflation of the lungs is noted with mild bibasilar subsegmental atelectasis. No pneumothorax or pleural effusion is noted. The visualized skeletal structures are unremarkable. IMPRESSION: Hypoinflation of lungs with mild bibasilar subsegmental atelectasis. Electronically Signed   By: Marijo Conception, M.D.   On: 06/14/2017 17:05   US Abdomen Complete  Result Date: 06/15/2017 CLINICAL DATA:  Neutropenic fever EXAM: ABDOMEN ULTRASOUND COMPLETE COMPARISON:  CT 05/26/2012 FINDINGS: Gallbladder: No gallstones or wall thickening visualized. No  sonographic Murphy sign noted by sonographer. Common bile duct: Diameter: 3.4 mm Liver: Enlarged liver measuring up to 20.5 cm. Coarse heterogeneous echotexture with nodular contour. No focal hepatic abnormality. Portal vein is patent on color Doppler imaging. There is bidirectional flow. IVC: No abnormality visualized. Pancreas: Visualized portion unremarkable. Spleen: Enlarged measuring up to 20.5 cm. Right Kidney: Length: 14.6 cm. Echogenicity within normal limits. No mass or hydronephrosis visualized. Left  Kidney: Length: 14.7 cm. Echogenicity within normal limits. No mass or hydronephrosis visualized. Abdominal aorta: No aneurysm visualized. Unable to visualize distal segment due to gas. Maximum AP diameter of 2.6 cm. Other findings: None. IMPRESSION: 1. Negative for gallstones or biliary dilatation 2. Enlarged coarse heterogeneous nodular liver, consistent with hepatocellular disease/suspected cirrhosis. Bidirectional flow in the portal vein suggesting portal hypertension 3. Splenomegaly 4. Maximum AP diameter of abdominal aorta measuring 2.6 cm. Ectatic abdominal aorta at risk for aneurysm development. Recommend followup by ultrasound in 5 years. This recommendation follows ACR consensus guidelines: White Paper of the ACR Incidental Findings Committee II on Vascular Findings. J Am Coll Radiol 2013; 10:789-794. Electronically Signed   By: Donavan Foil M.D.   On: 06/15/2017 03:20    Assessment and Plan:  52 y.o. Female with chronicleukopenia combining severe neutropenia and lymphopenia on presentation previously attributed to hepatic dysfunction of hepatic cirrhosis of unknown etiology.Patient has been noncompliant with follow-up  With both hematology and gastroenterology for cirrhosis etiology assessment.  Presently admitted with influenza infection, febrile neutropenia in the setting of productive cough.  Improving on the current antimicrobial therapy combining oseltamivir, cefepime, and  vancomycin.  Plan: -We will defer antimicrobial choice to the primary treating service, although I do not fully appreciate the role the vancomycin placed in the current management of the patient in absence of evidence of gram-positive bacterial infection -Start  G-CSF 300 mcg subcu daily to help with neutrophil count recovery to reduce the chances of protracted illness placing patient at increased risk of bacterial superinfection. -Etiology of patient's cirrhosis has never been established fully.  Consider consulting gastroenterology for inpatient assessment.  Previous testing demonstrates decreased complement levels, but negative ANA and ANCA testing.  Ardath Sax, MD 06/16/2017, 9:16 AM

## 2017-06-16 NOTE — Progress Notes (Addendum)
Inpatient Diabetes Program Recommendations  AACE/ADA: New Consensus Statement on Inpatient Glycemic Control (2015)  Target Ranges:  Prepandial:   less than 140 mg/dL      Peak postprandial:   less than 180 mg/dL (1-2 hours)      Critically ill patients:  140 - 180 mg/dL   Results for Holly Mueller, Holly Mueller (MRN [id number]) as of 06/16/2017 10:13  Ref. Range 06/16/2017 01:23 06/16/2017 07:57  Glucose-Capillary Latest Ref Range: 65 - 99 mg/dL 352 (H) 261 (H)   Results for Holly Mueller, Holly Mueller (MRN [id number]) as of 06/16/2017 10:13  Ref. Range 06/15/2017 10:00  Hemoglobin A1C Latest Ref Range: 4.8 - 5.6 % 9.1 (H)    Admit with: Neutropenic Fever  NO Prior History of Diabetes  Current Insulin Orders: Novolog Sensitive Correction Scale/ SSI (0-9 units) TID AC + HS      MD- Patient may benefit from initiation of oral Diabetes medication at time of discharge.  If not a candidate for Metformin, could try DPP-4 inhibitor medication like Tradjenta 5 mg daily.    Note MD has addressed new diagnosis of DM with the patient this AM.  Will order educational materials and RD consult for diet education.  Plan to have DM Coordinator speak with pt today regarding new diagnosis and will ask RN to please begin basic DM survival skills education with pt.     --Will follow patient during hospitalization--  Wyn Quaker RN, MSN, CDE Diabetes Coordinator Inpatient Glycemic Control Team Team Pager: [phone] (8a-5p)

## 2017-06-16 NOTE — Progress Notes (Signed)
Inpatient Diabetes Program Recommendations  Spoke with patient @ bedside regarding new onset diabetes. Discussed A1C results with them and explained what an A1C is, basic pathophysiology of DM Type 2, basic home care, basic diabetes diet nutrition principles, importance of checking CBGs and maintaining good CBG control to prevent long-term and short-term complications. Reviewed signs and symptoms of hyperglycemia and hypoglycemia and how to treat hypoglycemia at home. Also reviewed blood sugar goals at home.  RNs to provide ongoing basic DM education at bedside with this patient. Patient shared that she just finished watching a diabetes video. Limited visit due to patient complaint of headache and coughing. Shared her mom and brother had dx of diabetes.  Thank you, Nani Gasser. Minh Roanhorse, RN, MSN, CDE  Diabetes Coordinator Inpatient Glycemic Control Team Team Pager [phone] (8am-5pm) 06/16/2017 2:59 PM

## 2017-06-17 ENCOUNTER — Inpatient Hospital Stay (HOSPITAL_COMMUNITY): Payer: Medicaid Other

## 2017-06-17 DIAGNOSIS — I9589 Other hypotension: Secondary | ICD-10-CM

## 2017-06-17 DIAGNOSIS — K7460 Unspecified cirrhosis of liver: Secondary | ICD-10-CM

## 2017-06-17 DIAGNOSIS — D703 Neutropenia due to infection: Secondary | ICD-10-CM

## 2017-06-17 DIAGNOSIS — R0989 Other specified symptoms and signs involving the circulatory and respiratory systems: Secondary | ICD-10-CM

## 2017-06-17 DIAGNOSIS — E861 Hypovolemia: Secondary | ICD-10-CM

## 2017-06-17 DIAGNOSIS — J101 Influenza due to other identified influenza virus with other respiratory manifestations: Secondary | ICD-10-CM

## 2017-06-17 DIAGNOSIS — D696 Thrombocytopenia, unspecified: Secondary | ICD-10-CM

## 2017-06-17 DIAGNOSIS — J4520 Mild intermittent asthma, uncomplicated: Secondary | ICD-10-CM

## 2017-06-17 LAB — T3, FREE: T3, Free: 2 pg/mL (ref 2.0–4.4)

## 2017-06-17 LAB — BASIC METABOLIC PANEL
Anion gap: 9 (ref 5–15)
BUN: 6 mg/dL (ref 6–20)
CALCIUM: 8.1 mg/dL — AB (ref 8.9–10.3)
CO2: 22 mmol/L (ref 22–32)
Chloride: 108 mmol/L (ref 101–111)
Creatinine, Ser: 0.58 mg/dL (ref 0.44–1.00)
GFR calc Af Amer: >60 mL/min (ref >60)
GLUCOSE: 157 mg/dL — AB (ref 65–99)
Potassium: 3.5 mmol/L (ref 3.5–5.1)
Sodium: 139 mmol/L (ref 135–145)

## 2017-06-17 LAB — CBC
HCT: 31.7 % — ABNORMAL LOW (ref 36.0–46.0)
Hemoglobin: 10.7 g/dL — ABNORMAL LOW (ref 12.0–15.0)
MCH: 28.9 pg (ref 26.0–34.0)
MCHC: 33.8 g/dL (ref 30.0–36.0)
MCV: 85.7 fL (ref 78.0–100.0)
PLATELETS: 52 10*3/uL — AB (ref 150–400)
RBC: 3.7 MIL/uL — ABNORMAL LOW (ref 3.87–5.11)
RDW: 14.4 % (ref 11.5–15.5)
WBC: 1.5 10*3/uL — AB (ref 4.0–10.5)

## 2017-06-17 LAB — GLUCOSE, CAPILLARY
GLUCOSE-CAPILLARY: 143 mg/dL — AB (ref 65–99)
Glucose-Capillary: 117 mg/dL — ABNORMAL HIGH (ref 65–99)
Glucose-Capillary: 175 mg/dL — ABNORMAL HIGH (ref 65–99)
Glucose-Capillary: 144 mg/dL — ABNORMAL HIGH (ref 65–99)

## 2017-06-17 LAB — CULTURE, RESPIRATORY W GRAM STAIN: Culture: NORMAL

## 2017-06-17 LAB — CORTISOL-AM, BLOOD: Cortisol - AM: 6.7 ug/dL (ref 6.7–22.6)

## 2017-06-17 LAB — CULTURE, RESPIRATORY

## 2017-06-17 MED ORDER — SODIUM CHLORIDE 0.9 % IV BOLUS (SEPSIS)
1000.0000 mL | Freq: Once | INTRAVENOUS | Status: AC
  Administered 2017-06-17: 1000 mL via INTRAVENOUS

## 2017-06-17 NOTE — Progress Notes (Signed)
Pharmacy Antibiotic Note  Holly Mueller is a 52 y.o. female admitted on 06/14/2017 with febrile neutropenia, ?PNA. Pharmacy has been consulted for cefepime dosing - day #4. SCr stable wnl. Afeb, WBC up to 1.5.  Plan: Cefepime 2g IV Q8h Monitor clinical picture, renal function F/U C&S, abx deescalation / LOT  Height: _0  (167.6 cm) Weight: 245 lb 9.5 oz (111.4 kg) IBW/kg (Calculated) : 59.3  Temp (24hrs), Avg:98.5 F (36.9 C), Min:98.2 F (36.8 C), Max:98.8 F (37.1 C)  Recent Labs  Lab 06/14/17 1629 06/14/17 1659 06/16/17 0537 06/17/17 0453  WBC 0.8*  --  1.1* 1.5*  CREATININE 0.71  --  0.64 0.58  LATICACIDVEN  --  1.69  --   --     Estimated Creatinine Clearance: 104 mL/min (by C-G formula based on SCr of 0.58 mg/dL).    Allergies  Allergen Reactions  . Aspirin Nausea Only    Upset stomach    Elicia Lamp, PharmD, BCPS Clinical Pharmacist Clinical phone for 06/17/2017 until 3:30pm: [phone] If after 3:30pm, please call main pharmacy at: [phone] 06/17/2017 10:56 AM

## 2017-06-17 NOTE — Progress Notes (Signed)
FPTS Interim Progress Note  Spoke with Dr. Lebron Conners about stopping Cefepime. Discussed that patient has been afebrile for 72hours and has improving WBC. Dr. Lebron Conners agreed with our decision to stop Cefepime and continue to monitor.   Greatly appreciate his recommendations and following with this patient.  Nuala Alpha, DO 06/17/2017, 4:49 PM PGY-1, [facility] Service pager [phone]

## 2017-06-17 NOTE — Progress Notes (Signed)
Family Medicine Teaching Service Daily Progress Note Intern Pager: [phone]  Patient name: Holly Mueller Medical record number: [id number] Date of birth: 1965/10/13 Age: 52 y.o. Gender: female  Primary Care Provider: [name], MD Consultants: None Code Status: Full  Pt Overview and Major Events to Date:  2/5 - admitted for neutropenic fever  Assessment and Plan: Holly Mueller is a 52 y.o. female presenting with fever, productive cough, sore throat x3 days. PMH is significant for chronic leukopenia and thromobcytopenia, hx of asthma, hx of seizures, depression, hypothyroidism, OA, GERD.    Neutropenic Fever w/ h/o neutropenia, thrombocytopenia, ?h/o cirrhosis Sepsis picture resolved, VSS, afebrile. WBC 0.8>1.5. H/o chronic neutropenia and thrombocytopenia for the last 4-5 years, likely due to cirrhosis, evidenced from liver biopsy 2014. Monospot, CMV, HIV negative. Blood cultures NGx1d. Known sick contacts, flu positive evidenced by RVP and flu panel, explaining current symptoms. Primary hematologist, Dr. Lebron Conners, seen this admission and started G-CSF to aid in stimulating neutrophil counts, also recommend GI assessment. Will likely also need outpatient hepatology follow up. Patient feeling better today, still with persistent cough that is improving. Bibasilar crackles heard on exam today, repeat CXR without evidence of pleural effusion or consolidation. - s/p Vancomycin (2/5 - 2/6) - continue Cefepime (2/5 - ) - continue Tamiflu (2/5 - ) x 5 days - continue G-CSF 350mg per Heme/Onc - consult GI, appreciate recs - Heme/Onc following, appreciate recs - RVP/flu panel - influenza A + - droplet precautions - monitor CBC, BMP - PT/OT - no follow up - tylenol PRN - zorfran PRN - tessalon for cough - CXR  New Onset Diabetes Glucose 321 on admission. No known history of diabetes. A1c this admission 9.1. 168 this am, after starting sSSI. 5u received last 24 hours. - CBG  monitoring - sensitive SSI due to insulin naive - will likely be started on low dose Metformin on d/c  Hypotension Noted this morning, 2 readings documented at 91/58 and 93/58. Patient appears to be improving clinically. No signs of fluid overload. - 1L fluid bolus - am cortisol if BP does not improve after bolus  Hypothyroidism TSH this admission 10.886, free T4 0.59. Takes Synthroid 2526m at home with good compliance. - continue home synthroid. - consider titrating dose as needed outpatient once over acute illness  Asthma vs. Restrictive Lung Disease Last PFT in 11/2016 with FEV1 1.54, FVC 1.8, with ratio 85. PFT report states that the [facility] reduced relative to the SVC indicating air trapping, lung volumes are reduced, and did not show significant response to bronchodilators indicating a restrictive process. Takes Flonase, albuterol, Zyrtec, Symbicort at home. Lungs without wheezing/rales/rhonchi this morning. - continue home meds per formulary  GERD Denies active symptoms. Possible eosinophilic esophagitis on EGD in 2015, dilated lower esophageal ring in lower 1/3 of esophagus. Takes Protonix at home - continue home meds  H/o seizures Last seizure in 1970s on chart review and previously treated with phenobarbital. Does not require chronic prophylaxis. - monitor  Depression Previously treated with Elavil but hasn't taken since 2013.  - Monitor  FEN/GI: Heart Healthy/Carb modified Prophylaxis: SCDs due to thrombocytopenia  Disposition: continue inpatient management of leukopenia, flu  Subjective:  Patient feeling better today, still with persistent cough but improving. Received diabetic education yesterday and seems to have better understanding. Wants to know when she can be discharged. No further questions about plan at this time.  Objective: Temp:  [98.2 F (36.8 C)-98.8 F (37.1 C)] 98.2 F (36.8 C) (02/07  0539) Pulse Rate:  [74-83] 75 (02/07 0539) Resp:   [17-18] 18 (02/07 0539) BP: (91-128)/(58-73) 91/58 (02/07 0539) SpO2:  [96 %-98 %] 97 % (02/07 0539) Weight:  [245 lb 9.5 oz (111.4 kg)] 245 lb 9.5 oz (111.4 kg) (02/07 0533) Physical Exam: General: obese female in NAD, intermittent coughing, improved from yesterday Cardiovascular: RRR, no murmur/rubs/gallops Respiratory: CTAB, bibasilar crackles. Intermittent coughing, not productive at this time Abdomen: soft, obese abdomen, +BS, nontender to palpation  Extremities: warm and well perfused  Laboratory: Recent Labs  Lab 06/14/17 1629 06/16/17 0537 06/17/17 0453  WBC 0.8* 1.1* 1.5*  HGB 10.8* 10.1* 10.7*  HCT 31.6* 30.1* 31.7*  PLT 48* 49* 52*   Recent Labs  Lab 06/14/17 1629 06/16/17 0537 06/17/17 0453  NA 131* 134* 139  K 3.6 3.6 3.5  CL 102 104 108  CO2 21* 20* 22  BUN 6 5* 6  CREATININE 0.71 0.64 0.58  CALCIUM 8.5* 8.2* 8.1*  PROT 7.8  --   --   BILITOT 2.8*  --   --   ALKPHOS 160*  --   --   ALT 54  --   --   AST 116*  --   --   GLUCOSE 321* 280* 157*    Imaging/Diagnostic Tests: Dg Chest 2 View  Result Date: 06/14/2017 CLINICAL DATA:  Productive cough. EXAM: CHEST  2 VIEW COMPARISON:  Radiographs of April 05, 2017. FINDINGS: The heart size and mediastinal contours are within normal limits. Hypoinflation of the lungs is noted with mild bibasilar subsegmental atelectasis. No pneumothorax or pleural effusion is noted. The visualized skeletal structures are unremarkable. IMPRESSION: Hypoinflation of lungs with mild bibasilar subsegmental atelectasis. Electronically Signed   By: Marijo Conception, M.D.   On: 06/14/2017 17:05   US Abdomen Complete  Result Date: 06/15/2017 CLINICAL DATA:  Neutropenic fever EXAM: ABDOMEN ULTRASOUND COMPLETE COMPARISON:  CT 05/26/2012 FINDINGS: Gallbladder: No gallstones or wall thickening visualized. No sonographic Murphy sign noted by sonographer. Common bile duct: Diameter: 3.4 mm Liver: Enlarged liver measuring up to 20.5 cm. Coarse  heterogeneous echotexture with nodular contour. No focal hepatic abnormality. Portal vein is patent on color Doppler imaging. There is bidirectional flow. IVC: No abnormality visualized. Pancreas: Visualized portion unremarkable. Spleen: Enlarged measuring up to 20.5 cm. Right Kidney: Length: 14.6 cm. Echogenicity within normal limits. No mass or hydronephrosis visualized. Left Kidney: Length: 14.7 cm. Echogenicity within normal limits. No mass or hydronephrosis visualized. Abdominal aorta: No aneurysm visualized. Unable to visualize distal segment due to gas. Maximum AP diameter of 2.6 cm. Other findings: None. IMPRESSION: 1. Negative for gallstones or biliary dilatation 2. Enlarged coarse heterogeneous nodular liver, consistent with hepatocellular disease/suspected cirrhosis. Bidirectional flow in the portal vein suggesting portal hypertension 3. Splenomegaly 4. Maximum AP diameter of abdominal aorta measuring 2.6 cm. Ectatic abdominal aorta at risk for aneurysm development. Recommend followup by ultrasound in 5 years. This recommendation follows ACR consensus guidelines: White Paper of the ACR Incidental Findings Committee II on Vascular Findings. J Am Coll Radiol 2013; 10:789-794. Electronically Signed   By: Donavan Foil M.D.   On: 06/15/2017 03:20   Rory Percy, DO 06/17/2017, 9:22 AM PGY-1, [facility] Intern pager: [phone], text pages welcome

## 2017-06-17 NOTE — Progress Notes (Signed)
IP PROGRESS NOTE  Subjective:  Patient is improving clinically with no recurrent fever, stable hemodynamics.  Feeling slightly better as well with some improvement in cough.  Objective: Vital signs in last 24 hours: Blood pressure 104/67, pulse 79, temperature 98.1 F (36.7 C), temperature source Oral, resp. rate 18, height _0  (1.676 m), weight 245 lb 9.5 oz (111.4 kg), SpO2 94 %.  Intake/Output from previous day: 02/06 0701 - 02/07 0700 In: 1540 [P.O.:1440; IV Piggyback:100] Out: 0   Physical Exam: Patient is alert, awake, oriented x3.  Appears to be ill HEENT: Anicteric sclerae.  Moist mucous membranes, PERRLA, EOMI. Lungs: Coarse breath sounds bilaterally without expiratory wheezing. Cardiac: S1/S2, regular.  No murmurs, rubs, gallops. Abdomen: Obese, nontender.  Cannot appreciate hepatosplenomegaly due to habitus. Lymph nodes: No palpable lymphadenopathy Neurologic: No gross focal neurological deficits Musculoskeletal: Bilateral trace edema in the lower extremities  Lab Results: Recent Labs    06/16/17 0537 06/17/17 0453  WBC 1.1* 1.5*  HGB 10.1* 10.7*  HCT 30.1* 31.7*  PLT 49* 52*    BMET Recent Labs    06/16/17 0537 06/17/17 0453  NA 134* 139  K 3.6 3.5  CL 104 108  CO2 20* 22  GLUCOSE 280* 157*  BUN 5* 6  CREATININE 0.64 0.58  CALCIUM 8.2* 8.1*    No results found for: CEA1  Studies/Results: Dg Chest 2 View  Result Date: 06/17/2017 CLINICAL DATA:  cough EXAM: CHEST  2 VIEW FINDINGS: Normal cardiac silhouette. Fine interstitial linear lung pattern. No change from prior. Low lung volumes. No consolidation. No pneumothorax or pleural fluid. IMPRESSION: 1. No significant change. 2. Fine interstitial pattern suggests edema. Electronically Signed   By: Suzy Bouchard M.D.   On: 06/17/2017 10:15    Medications: I have reviewed the patient's current medications.  Assessment/Plan: 52 y.o. female with hepatic cirrhosis associated with pancytopenia  including moderate to severe neutropenia.  Admitted to the hospital due to febrile illness associated with positive influenza test.  Started on broad-spectrum antibiotics as well as oseltamivir on admission as well as aggressive fluid support due to presentation with sepsis.  Hemodynamic stabilization, afebrile for the past 72 hours, started on Neupogen yesterday with improving white blood cell count.  Recommendations: -Agree with discontinuation of vancomycin and cefepime at this point in time as no evidence of bacterial infection is present -Continue oseltamivir -Daily CBC with differential to assess improvement in the neutrophil count - Continue Neupogen for at least 3 days or until absolute neutrophil count is over 2000 on 2 consecutive days or over 5000 on any day.   LOS: 3 days   Ardath Sax, MD   06/17/2017, 7:48 PM

## 2017-06-17 NOTE — Progress Notes (Signed)
Physical Therapy Treatment Patient Details Name: Holly Mueller MRN: [id number] DOB: 1965-05-18 Today's Date: 06/17/2017    History of Present Illness Pt is a 52 y.o. female presenting with fever, productive cough, sore throat x3 days. Pt found positive for the flu. PMH is significant for chronic leukopenia and thromobcytopenia, hx of asthma, hx of seizures, depression, hypothyroidism, OA, GERD.      PT Comments    Pt seen for mobility progression; however, remains limited secondary to fatigue with ambulation and coughing bouts. Pt on RA with SPO2 maintaining >93% throughout. Pt ambulated without support of IV pole this session. PT will continue to follow pt acutely to progress mobility as tolerated and to ensure a safe d/c home.    Follow Up Recommendations  No PT follow up;Supervision - Intermittent     Equipment Recommendations  None recommended by PT    Recommendations for Other Services       Precautions / Restrictions Precautions Precautions: None Restrictions Weight Bearing Restrictions: No    Mobility  Bed Mobility Overal bed mobility: Modified Independent                Transfers Overall transfer level: Needs assistance Equipment used: None Transfers: Sit to/from Stand Sit to Stand: Supervision         General transfer comment: for safety  Ambulation/Gait Ambulation/Gait assistance: Supervision Ambulation Distance (Feet): 150 Feet Assistive device: None(occasionally reaching for handrails in hallway) Gait Pattern/deviations: Step-through pattern;Decreased stride length Gait velocity: Decreased  Gait velocity interpretation: Below normal speed for age/gender General Gait Details: no instability or LOB, supervision for safety with steady gait. Pt fatiguing quickly with gait which limited distance this session. Pt required one standing rest break with coughing and reports of difficulty breathing. Pt on RA with SPO2 >93% throughout.   Stairs             Wheelchair Mobility    Modified Rankin (Stroke Patients Only)       Balance Overall balance assessment: Needs assistance Sitting-balance support: No upper extremity supported;Feet supported Sitting balance-Leahy Scale: Good     Standing balance support: During functional activity;No upper extremity supported Standing balance-Leahy Scale: Good                              Cognition Arousal/Alertness: Awake/alert Behavior During Therapy: WFL for tasks assessed/performed Overall Cognitive Status: Within Functional Limits for tasks assessed                                        Exercises      General Comments        Pertinent Vitals/Pain Pain Assessment: No/denies pain    Home Living                      Prior Function            PT Goals (current goals can now be found in the care plan section) Acute Rehab PT Goals PT Goal Formulation: With patient Time For Goal Achievement: 06/29/17 Potential to Achieve Goals: Good Progress towards PT goals: Progressing toward goals    Frequency    Min 3X/week      PT Plan Current plan remains appropriate    Co-evaluation              AM-PAC PT "6 Clicks"  Daily Activity  Outcome Measure  Difficulty turning over in bed (including adjusting bedclothes, sheets and blankets)?: None Difficulty moving from lying on back to sitting on the side of the bed? : None Difficulty sitting down on and standing up from a chair with arms (e.g., wheelchair, bedside commode, etc,.)?: None Help needed moving to and from a bed to chair (including a wheelchair)?: None Help needed walking in hospital room?: None Help needed climbing 3-5 steps with a railing? : A Little 6 Click Score: 23    End of Session   Activity Tolerance: Patient limited by fatigue Patient left: in bed;with call bell/phone within reach;Other (comment)(sitting EOB) Nurse Communication: Mobility status PT Visit  Diagnosis: Other abnormalities of gait and mobility (R26.89)     Time: 0912-0922 PT Time Calculation (min) (ACUTE ONLY): 10 min  Charges:  $Gait Training: 8-22 mins                    G Codes:       [place], [place], [place] 06/17/2017, 9:49 AM

## 2017-06-17 NOTE — Consult Note (Signed)
[facility] Consult: 10:57 AM 06/17/2017  LOS: 3 days    Referring Provider: Dr Nori Riis  Primary Care Physician:  [name], MD Primary Gastroenterologist: Carlean Purl, never followed up after EGD 2015.      Reason for Consultation: Cirrhosis.   HPI: Holly Mueller is a 52 y.o. female.  Hx obesity.  Chronic leukopenia and thrombocytopenia. Bone marrow biopsy 11/2016 neg for myelodysplasia or leukemia.   Followed by hematologist Dr Lebron Conners.  Asthma.  Seizures.  Hypothyroidism.  GERD.  O/A.  Asthma.    Elevated LFTs dating to 2008. Fatty liver seen on 04/2012 CT.  Cirrhosis, splenomegaly (seen on [place] in 2013 and CT 2014 too), upper abdominal adenopathy (likely reactive to cirrhosis) noted on 11/2016 CT chest.  The adenopathy dates back to 2007. Dr Lucia Gaskins performed liver biopsy of cirrhotic looking liver during 06/2012 hernia surgery.  Path confirmed cirrhosis. No ETOH and tested negative on several occasions for Hep B/C.  Umbilical and ventral  hernia repair.   EGD 10/2013 for dysphagia.  Distal esophageal ring was dilated.  Changes sugg of eosinophilic esophagitis throughout esophagus. Path: chronic gastritis, reactive squamous esophageal mucosa with rare neutrophils but no increased eosinophils, neg for h Pylori.   When she was seen by Dr Carlean Purl in 2015, the issue of cirrhosis was not noted in his office note.     Now admitted with fever, cough and tested + for Influenza A, treating with Tamiflu.  Ultrasound shows nodular cirrhotic liver.    INR normal.  Hgb 10.7, MCV normal.  Platelets 40s- 50s (lower than typical 70s-80s.   Dr. Lebron Conners has started G-CSF to address neutropenia. Glucose 321 on admission and she now has a diagnosis of type 2 diabetes Patient complains of pain in her ribs and back from all the coughing  she is doing.  Overall she feels better however.  Patient denies nausea or vomiting.  She normally has brown stools twice a day.  She denies recent weight increase or extremity edema or increased abdominal girth.  No dysphagia.  Patient did not get her flu shot this year and there were family members, with whom she lives, who were sick with flulike symptoms but did not seek medical attention.  Patient has occasional minor nasal bleeding.  She does not recall previous transfusions or needing to take iron supplements.  She is never had a colonoscopy.  Family history.  Her mother died at about age 37 with cirrhosis.  She was not a drinker but she was obese.  She does not know much about her father's side of the family.    Past Medical History:  Diagnosis Date  . Arthritis    bursitis left hip flares-not an issue  . Asthma 2004  . Edentulous    10-19-13 at present  . Epilepsy ([place]) in 1972   No seizures since 1972. Previously treated with phenobarbital.   . Fever 06/2017  . GERD (gastroesophageal reflux disease) 2008  . Headache(784.0)    hx of migraines   . Hypothyroidism 2008  . Lower esophageal ring  08/18/2013  . Seasonal allergies 2003  . Shortness of breath     Past Surgical History:  Procedure Laterality Date  . BALLOON DILATION N/A 10/24/2013   Procedure: BALLOON DILATION;  Surgeon: Gatha Mayer, MD;  Location: [facility];  Service: Endoscopy;  Laterality: N/A;  . CESAREAN SECTION     x2  . DENTAL SURGERY     multiple extractions 3'15  . ESOPHAGOGASTRODUODENOSCOPY N/A 10/24/2013   Procedure: ESOPHAGOGASTRODUODENOSCOPY (EGD);  Surgeon: Gatha Mayer, MD;  Location: [facility];  Service: Endoscopy;  Laterality: N/A;  . LIVER BIOPSY  06/30/2012   Procedure: LIVER BIOPSY;  Surgeon: Shann Medal, MD;  Location: [facility];  Service: General;;  . NOVASURE ABLATION    . SHOULDER ARTHROSCOPY Right 2011  . UMBILICAL HERNIA REPAIR N/A 06/30/2012   Procedure: remove umbilicus;   Surgeon: Shann Medal, MD;  Location: [facility];  Service: General;  Laterality: N/A;  . VENTRAL HERNIA REPAIR N/A 06/30/2012   Procedure: LAPAROSCOPIC VENTRAL HERNIA;  Surgeon: Shann Medal, MD;  Location: [facility];  Service: General;  Laterality: N/A;  With Mesh  . WISDOM TOOTH EXTRACTION      Prior to Admission medications   Medication Sig Start Date End Date Taking? Authorizing Provider  albuterol (PROAIR HFA) 108 (90 Base) MCG/ACT inhaler INHALE 2 PUFFS INTO THE LUNGS EVERY 6 HOURS AS NEEDED FOR SHORTNESS OF BREATH 10/23/16  Yes Gonfa, Taye T, MD  albuterol (PROVENTIL) (2.5 MG/3ML) 0.083% nebulizer solution Take 3 mLs (2.5 mg total) by nebulization every 6 (six) hours as needed for wheezing or shortness of breath. 10/23/16  Yes Mercy Riding, MD  benzonatate (TESSALON) 100 MG capsule Take 1 capsule (100 mg total) by mouth 2 (two) times daily as needed for cough. 03/11/17  Yes [name], MD  budesonide-formoterol [facility]) 160-4.5 MCG/ACT inhaler Inhale 2 puffs into the lungs 2 (two) times daily. 04/05/17  Yes Tanna Furry, MD  cetirizine (ZYRTEC) 10 MG tablet Take 1 tablet (10 mg total) by mouth daily. 01/27/17  Yes [name], MD  fluticasone [facility]) 50 MCG/ACT nasal spray Place 2 sprays into both nostrils daily.   Yes [provider]  levothyroxine (SYNTHROID, LEVOTHROID) 125 MCG tablet Take 2 tablets (250 mcg total) by mouth daily. 03/11/17  Yes [name], MD  pantoprazole (PROTONIX) 40 MG tablet Take 1 tablet (40 mg total) by mouth daily. 03/11/17  Yes [name], MD  promethazine-dextromethorphan (PROMETHAZINE-DM) 6.25-15 MG/5ML syrup Take 5 mLs by mouth 4 (four) times daily as needed for cough. 04/05/17  Yes Tanna Furry, MD    Scheduled Meds: . dextromethorphan-guaiFENesin  1 tablet Oral BID  . fluticasone  2 spray Each Nare Daily  . insulin aspart  0-9 Units Subcutaneous TID WC  . levothyroxine  250 mcg Oral QAC breakfast  . loratadine  10  mg Oral Daily  . mometasone-formoterol  2 puff Inhalation BID  . oseltamivir  75 mg Oral BID  . pantoprazole  40 mg Oral Daily   Infusions: . ceFEPime (MAXIPIME) IV Stopped (06/17/17 0600)   PRN Meds: acetaminophen **OR** acetaminophen, albuterol, benzonatate, Influenza vac split quadrivalent PF, ondansetron **OR** ondansetron (ZOFRAN) IV, polyethylene glycol   Allergies as of 06/14/2017 - Review Complete 06/14/2017  Allergen Reaction Noted  . Aspirin Nausea Only 03/29/2012    Family History  Problem Relation Age of Onset  . Hypertension Mother   . Diabetes Mother   . Lung cancer Father   . Stroke Father   .  Diabetes Sister   . Hypertension Sister     Social History   Socioeconomic History  . Marital status: Single    Spouse name: Not on file  . Number of children: 2  . Years of education: Not on file  . Highest education level: Not on file  Social Needs  . Financial resource strain: Not on file  . Food insecurity - worry: Not on file  . Food insecurity - inability: Not on file  . Transportation needs - medical: Not on file  . Transportation needs - non-medical: Not on file  Occupational History    Employer: [employer]  Tobacco Use  . Smoking status: Former Smoker    Packs/day: 0.25    Types: Cigarettes    Last attempt to quit: 1997    Years since quitting: 22.1  . Smokeless tobacco: Never Used  Substance and Sexual Activity  . Alcohol use: Yes    Alcohol/week: 0.0 oz    Comment: occ  . Drug use: No  . Sexual activity: No  Other Topics Concern  . Not on file  Social History Narrative   Works in Estate manager/land agent at [employer].    Lives with 12 and 14 (boys).    Also living with close friends of the family (mother and two kids, adults).           REVIEW OF SYSTEMS: Constitutional: With the flu she has felt very weak and tired but this is improving. ENT:  No nose bleeds Pulm: Coughing.  Not dyspneic at rest. CV:  No palpitations, no LE edema.    GU:  No hematuria, no frequency GI:  Per HPI Heme:  Per HPI.  Denies excessive or unusual bleeding or bruising. Transfusions: None Neuro:  No headaches, no peripheral tingling or numbness Derm:  No itching, no rash or sores.  Endocrine:  No sweats or chills.  No polyuria or dysuria Immunization: Get her flu shot this year Travel:  None beyond local counties in last few months.    PHYSICAL EXAM: Vital signs in last 24 hours: Vitals:   06/17/17 0539 06/17/17 0927  BP: (!) 91/58   Pulse: 75   Resp: 18   Temp: 98.2 F (36.8 C)   SpO2: 97% 96%   Wt Readings from Last 3 Encounters:  06/17/17 111.4 kg (245 lb 9.5 oz)  04/27/17 112.2 kg (247 lb 6.4 oz)  01/27/17 113.7 kg (250 lb 9.6 oz)    General: Pleasant, calm, non-acutely ill-appearing, obese Head: No asymmetry or facial edema.  No signs of head trauma. Eyes: No scleral icterus.  Conjunctiva slightly pale.  EOMI. Ears: Not hard of hearing. Nose: No congestion or discharge Mouth: Tongue midline.  Moist, pink, clear oral mucosa.  No teeth. Neck: No JVD, no thyromegaly, no masses Lungs: Clear bilaterally.  No cough during exam.  No dyspnea with speech. Heart: RRR.  No MRG.  S1, S2 present. Abdomen: Obese.  Striae.  Do not appreciate HSM.  No bruits.  Not tender.  Active bowel sounds.  Not distended or protuberant. Rectal: Deferred Musc/Skeltl: No joint redness or swelling.  No contracture deformities. Extremities: No CCE. Neurologic: Oriented x3.  Moves all 4 limbs, no asterixis or tremor.  No weakness. Skin: A few telangiectasia, tiny, on the upper chest. Tattoos: None seen Nodes: No cervical adenopathy Psych: Calm, pleasant, cooperative.  Intake/Output from previous day: 02/06 0701 - 02/07 0700 In: 1540 [P.O.:1440; IV Piggyback:100] Out: 0  Intake/Output this shift: No intake/output data  recorded.  LAB RESULTS: Recent Labs    06/14/17 1629 06/16/17 0537 06/17/17 0453  WBC 0.8* 1.1* 1.5*  HGB 10.8* 10.1* 10.7*   HCT 31.6* 30.1* 31.7*  PLT 48* 49* 52*   BMET Lab Results  Component Value Date   NA 139 06/17/2017   NA 134 (L) 06/16/2017   NA 131 (L) 06/14/2017   K 3.5 06/17/2017   K 3.6 06/16/2017   K 3.6 06/14/2017   CL 108 06/17/2017   CL 104 06/16/2017   CL 102 06/14/2017   CO2 22 06/17/2017   CO2 20 (L) 06/16/2017   CO2 21 (L) 06/14/2017   GLUCOSE 157 (H) 06/17/2017   GLUCOSE 280 (H) 06/16/2017   GLUCOSE 321 (H) 06/14/2017   BUN 6 06/17/2017   BUN 5 (L) 06/16/2017   BUN 6 06/14/2017   CREATININE 0.58 06/17/2017   CREATININE 0.64 06/16/2017   CREATININE 0.71 06/14/2017   CALCIUM 8.1 (L) 06/17/2017   CALCIUM 8.2 (L) 06/16/2017   CALCIUM 8.5 (L) 06/14/2017   LFT Recent Labs    06/14/17 1629  PROT 7.8  ALBUMIN 2.9*  AST 116*  ALT 54  ALKPHOS 160*  BILITOT 2.8*   PT/INR Lab Results  Component Value Date   INR 1.38 06/15/2017   INR 1.23 12/02/2016   Hepatitis Panel No results for input(s): HEPBSAG, HCVAB, HEPAIGM, HEPBIGM in the last 72 hours. C-Diff No components found for: CDIFF Lipase     Component Value Date/Time   LIPASE 32 05/26/2012 1119    Drugs of Abuse     Component Value Date/Time   LABOPIA NONE DETECTED 05/26/2012 1120   COCAINSCRNUR NONE DETECTED 05/26/2012 1120   LABBENZ NONE DETECTED 05/26/2012 1120   AMPHETMU NONE DETECTED 05/26/2012 1120   THCU NONE DETECTED 05/26/2012 1120   LABBARB NONE DETECTED 05/26/2012 1120     RADIOLOGY STUDIES: Dg Chest 2 View  Result Date: 06/17/2017 CLINICAL DATA:  cough EXAM: CHEST  2 VIEW FINDINGS: Normal cardiac silhouette. Fine interstitial linear lung pattern. No change from prior. Low lung volumes. No consolidation. No pneumothorax or pleural fluid. IMPRESSION: 1. No significant change. 2. Fine interstitial pattern suggests edema. Electronically Signed   By: Suzy Bouchard M.D.   On: 06/17/2017 10:15      IMPRESSION:   Cirrhosis of liver.  No ETOH, Hep BC negative.  ANA and ANCA testing 11/2016  negative.  Serum copper level WNL 11/2016.   EBV NA IgG and EBV early Antigen Ab IgG testing + EBV VCA IgM negative in 11/2016.    Suspect the etiology is NASH as she has a long-standing history of obesity. Overall in terms of her coags, her mental status and lack of peripheral edema and no new increased abdominal girth, the cirrhosis appears well compensated.  *   Positive influenza a, on Tamiflu.  Course of therapy is 5 days.      *   Chronic neutropenia and thrombocytopenia.  Has been started on growth colony-stimulating factor to address neutropenia. Agree that the thrombocytopenia is likely secondary to cirrhosis and splenomegaly.  *   New diagnosis of type 2 diabetes.    PLAN:     *   AMA, smooth muscle, alpha 1 AT labs to rule out other causes of cirrhosis.  *   Consider obtaining ultrasound to assess for ascites.  Will discuss this with Dr. Havery Moros.  It is possible, that with her central obesity, ultrasound may be suboptimal. In future she will require outpatient screening  upper endoscopy to look for portal hypertensive gastropathy and varices.  She does not need this now as an inpatient.      Azucena Freed  06/17/2017, 10:57 AM Pager: [phone]

## 2017-06-17 NOTE — Progress Notes (Signed)
  RD consulted for nutrition education regarding diabetes.   Lab Results  Component Value Date   HGBA1C 9.1 (H) 06/15/2017    RD provided "Carbohydrate Counting for People with Diabetes" handout from the Academy of Nutrition and Dietetics. Discussed different food groups and their effects on blood sugar, emphasizing carbohydrate-containing foods. Provided list of carbohydrates and recommended serving sizes of common foods.  Discussed importance of controlled and consistent carbohydrate intake throughout the day. Provided examples of ways to balance meals/snacks and encouraged intake of high-fiber, whole grain complex carbohydrates. Teach back method used.  Expect good compliance.  Body mass index is 39.64 kg/m. Pt meets criteria for Obesity Unspecified based on current BMI.  Current diet order is Carb Modified/Heart Healthy, patient is consuming approximately 100% of meals at this time. Pt reports appetite improving. Labs and medications reviewed. No further nutrition interventions warranted at this time. RD contact information provided. If additional nutrition issues arise, please re-consult RD.  Kerman Passey MS, RD, [place], [place] [phone] Pager  [phone] Weekend/On-Call Pager

## 2017-06-18 DIAGNOSIS — J45909 Unspecified asthma, uncomplicated: Secondary | ICD-10-CM

## 2017-06-18 DIAGNOSIS — J111 Influenza due to unidentified influenza virus with other respiratory manifestations: Secondary | ICD-10-CM

## 2017-06-18 DIAGNOSIS — R69 Illness, unspecified: Secondary | ICD-10-CM

## 2017-06-18 DIAGNOSIS — R0989 Other specified symptoms and signs involving the circulatory and respiratory systems: Secondary | ICD-10-CM

## 2017-06-18 LAB — GLUCOSE, CAPILLARY
Glucose-Capillary: 117 mg/dL — ABNORMAL HIGH (ref 65–99)
Glucose-Capillary: 166 mg/dL — ABNORMAL HIGH (ref 65–99)
Glucose-Capillary: 134 mg/dL — ABNORMAL HIGH (ref 65–99)
Glucose-Capillary: 164 mg/dL — ABNORMAL HIGH (ref 65–99)

## 2017-06-18 LAB — EXPECTORATED SPUTUM ASSESSMENT W GRAM STAIN, RFLX TO RESP C

## 2017-06-18 LAB — MITOCHONDRIAL ANTIBODIES: MITOCHONDRIAL M2 AB, IGG: 180.8 U — AB (ref 0.0–20.0)

## 2017-06-18 LAB — CBC
HCT: 31.7 % — ABNORMAL LOW (ref 36.0–46.0)
Hemoglobin: 10.6 g/dL — ABNORMAL LOW (ref 12.0–15.0)
MCH: 28.9 pg (ref 26.0–34.0)
MCHC: 33.4 g/dL (ref 30.0–36.0)
MCV: 86.4 fL (ref 78.0–100.0)
PLATELETS: 51 10*3/uL — AB (ref 150–400)
RBC: 3.67 MIL/uL — ABNORMAL LOW (ref 3.87–5.11)
RDW: 14.4 % (ref 11.5–15.5)
WBC: 2.3 10*3/uL — AB (ref 4.0–10.5)

## 2017-06-18 LAB — BASIC METABOLIC PANEL
ANION GAP: 9 (ref 5–15)
BUN: 8 mg/dL (ref 6–20)
CALCIUM: 8.5 mg/dL — AB (ref 8.9–10.3)
CO2: 20 mmol/L — ABNORMAL LOW (ref 22–32)
Chloride: 108 mmol/L (ref 101–111)
Creatinine, Ser: 0.6 mg/dL (ref 0.44–1.00)
GLUCOSE: 137 mg/dL — AB (ref 65–99)
POTASSIUM: 3.2 mmol/L — AB (ref 3.5–5.1)
SODIUM: 137 mmol/L (ref 135–145)

## 2017-06-18 LAB — CORTISOL-AM, BLOOD: Cortisol - AM: 1.3 ug/dL — ABNORMAL LOW (ref 6.7–22.6)

## 2017-06-18 LAB — ALPHA-1-ANTITRYPSIN: A-1 Antitrypsin, Ser: 154 mg/dL (ref 90–200)

## 2017-06-18 MED ORDER — POTASSIUM CHLORIDE CRYS ER 20 MEQ PO TBCR
40.0000 meq | EXTENDED_RELEASE_TABLET | Freq: Once | ORAL | Status: AC
  Administered 2017-06-18: 40 meq via ORAL
  Filled 2017-06-18: qty 2

## 2017-06-18 MED ORDER — TBO-FILGRASTIM 300 MCG/0.5ML ~~LOC~~ SOSY
300.0000 ug | PREFILLED_SYRINGE | Freq: Once | SUBCUTANEOUS | Status: AC
  Administered 2017-06-19: 300 ug via SUBCUTANEOUS
  Filled 2017-06-18: qty 0.5

## 2017-06-18 MED ORDER — TBO-FILGRASTIM 300 MCG/0.5ML ~~LOC~~ SOSY
300.0000 ug | PREFILLED_SYRINGE | Freq: Once | SUBCUTANEOUS | Status: AC
  Administered 2017-06-18: 300 ug via SUBCUTANEOUS
  Filled 2017-06-18: qty 0.5

## 2017-06-18 NOTE — Progress Notes (Signed)
Family Medicine Teaching Service Daily Progress Note Intern Pager: [phone]  Patient name: Holly Mueller Medical record number: [id number] Date of birth: 14-Nov-1965 Age: 52 y.o. Gender: female  Primary Care Provider: [name], MD Consultants: None Code Status: Full  Pt Overview and Major Events to Date:  2/5 - admitted for neutropenic fever  Assessment and Plan: Holly Mueller is a 52 y.o. female presenting with fever, productive cough, sore throat x3 days. PMH is significant for chronic leukopenia and thromobcytopenia, hx of asthma, hx of seizures, depression, hypothyroidism, OA, GERD.    Neutropenic Fever w/ h/o neutropenia, thrombocytopenia, ?h/o cirrhosis Sepsis picture resolved, VSS, afebrile. WBC 0.8>2.3. H/o chronic neutropenia and thrombocytopenia for the last 4-5 years, likely due to cirrhosis, evidenced from liver biopsy 2014. Monospot, CMV, HIV negative. Known sick contacts, flu positive evidenced by RVP and flu panel, explaining current symptoms. Primary hematologist, Dr. Lebron Conners, seen this admission and started G-CSF to aid in stimulating neutrophil counts, patient will continue for 3 days or until [place] >2000 on 2 consecutive days or >5000 on any day. GI consulted who recommend further labs to rule out other causes of cirrhosis and will follow outpatient, now signed off. Patient feeling better today. - s/p Vancomycin (2/5 - 2/6) and Cefepime (2/5 - 2/7) - continue Tamiflu (2/5 - ) x 5 days - continue G-CSF 367mg per Heme/Onc - GI signed off - Heme/Onc following, appreciate recs - f/u ANA, smooth muscle, alpha 1 AT, AFP labs - droplet precautions - monitor CBC, BMP - PT/OT - no follow up - tylenol PRN - zorfran PRN - tessalon for cough  New Onset Diabetes Glucose 321 on admission. No known history of diabetes. A1c this admission 9.1. 144 this am, after starting sSSI. 3u received last 24 hours. - CBG monitoring - sensitive SSI due to insulin naive - will  likely be started on low dose Metformin on d/c  Hypotension Noted yesterday lowest at 91/58. Improved to 114/75 yesterday after bolus, am cortisol low at 1.3. Will defer steroids at this time due to acute illness. Looks like she is normally around 120-130s. - s/p 1L fluid bolus  Hypothyroidism TSH this admission 10.886, free T4 0.59. Takes Synthroid 2555m at home with good compliance. - continue home synthroid. - consider titrating dose as needed outpatient once over acute illness  Asthma vs. Restrictive Lung Disease Last PFT in 11/2016 with FEV1 1.54, FVC 1.8, with ratio 85. PFT report states [facility] reduced relative to the SVC indicating air trapping, lung volumes are reduced, and did not show significant response to bronchodilators indicating a restrictive process. Takes Flonase, albuterol, Zyrtec, Symbicort at home. Lungs without wheezing/rales/rhonchi this morning. - continue home meds per formulary  GERD Denies active symptoms. Possible eosinophilic esophagitis on EGD in 2015, dilated lower esophageal ring in lower 1/3 of esophagus. Takes Protonix at home - continue home meds  H/o seizures Last seizure in 1970s on chart review and previously treated with phenobarbital. Does not require chronic prophylaxis. - monitor  Depression Previously treated with Elavil but hasn't taken since 2013.  - Monitor  FEN/GI: Heart Healthy/Carb modified Prophylaxis: SCDs due to thrombocytopenia  Disposition: can likely d/c today pending BP stabilization  Subjective:  Patient feels well today, wants to know when she can go home.  Objective: Temp:  [98.4 F (36.9 C)-98.6 F (37 C)] 98.6 F (37 C) (02/08 0445) Pulse Rate:  [78-84] 78 (02/08 0445) Resp:  [19-20] 20 (02/08 0445) BP: (104-114)/(59-75) 108/59 (02/08 0445)  SpO2:  [98 %-100 %] 98 % (02/08 0918) Weight:  [245 lb 2.4 oz (111.2 kg)] 245 lb 2.4 oz (111.2 kg) (02/07 2100) Physical Exam: General: obese female in NAD,  intermittent coughing, improved from yesterday. Clinically looks much improved from yesterday. Cardiovascular: RRR, no murmur/rubs/gallops Respiratory: CTAB, improved bibasilar crackles. Intermittent coughing, not productive at this time Abdomen: soft, obese abdomen, +BS, nontender to palpation  Extremities: warm and well perfused  Laboratory: Recent Labs  Lab 06/16/17 0537 06/17/17 0453 06/18/17 0537  WBC 1.1* 1.5* 2.3*  HGB 10.1* 10.7* 10.6*  HCT 30.1* 31.7* 31.7*  PLT 49* 52* 51*   Recent Labs  Lab 06/14/17 1629 06/16/17 0537 06/17/17 0453 06/18/17 0537  NA 131* 134* 139 137  K 3.6 3.6 3.5 3.2*  CL 102 104 108 108  CO2 21* 20* 22 20*  BUN 6 5* 6 8  CREATININE 0.71 0.64 0.58 0.60  CALCIUM 8.5* 8.2* 8.1* 8.5*  PROT 7.8  --   --   --   BILITOT 2.8*  --   --   --   ALKPHOS 160*  --   --   --   ALT 54  --   --   --   AST 116*  --   --   --   GLUCOSE 321* 280* 157* 137*    Imaging/Diagnostic Tests: Dg Chest 2 View  Result Date: 06/17/2017 CLINICAL DATA:  cough EXAM: CHEST  2 VIEW FINDINGS: Normal cardiac silhouette. Fine interstitial linear lung pattern. No change from prior. Low lung volumes. No consolidation. No pneumothorax or pleural fluid. IMPRESSION: 1. No significant change. 2. Fine interstitial pattern suggests edema. Electronically Signed   By: Suzy Bouchard M.D.   On: 06/17/2017 10:15   Dg Chest 2 View  Result Date: 06/14/2017 CLINICAL DATA:  Productive cough. EXAM: CHEST  2 VIEW COMPARISON:  Radiographs of April 05, 2017. FINDINGS: The heart size and mediastinal contours are within normal limits. Hypoinflation of the lungs is noted with mild bibasilar subsegmental atelectasis. No pneumothorax or pleural effusion is noted. The visualized skeletal structures are unremarkable. IMPRESSION: Hypoinflation of lungs with mild bibasilar subsegmental atelectasis. Electronically Signed   By: Marijo Conception, M.D.   On: 06/14/2017 17:05   US Abdomen Complete  Result  Date: 06/15/2017 CLINICAL DATA:  Neutropenic fever EXAM: ABDOMEN ULTRASOUND COMPLETE COMPARISON:  CT 05/26/2012 FINDINGS: Gallbladder: No gallstones or wall thickening visualized. No sonographic Murphy sign noted by sonographer. Common bile duct: Diameter: 3.4 mm Liver: Enlarged liver measuring up to 20.5 cm. Coarse heterogeneous echotexture with nodular contour. No focal hepatic abnormality. Portal vein is patent on color Doppler imaging. There is bidirectional flow. IVC: No abnormality visualized. Pancreas: Visualized portion unremarkable. Spleen: Enlarged measuring up to 20.5 cm. Right Kidney: Length: 14.6 cm. Echogenicity within normal limits. No mass or hydronephrosis visualized. Left Kidney: Length: 14.7 cm. Echogenicity within normal limits. No mass or hydronephrosis visualized. Abdominal aorta: No aneurysm visualized. Unable to visualize distal segment due to gas. Maximum AP diameter of 2.6 cm. Other findings: None. IMPRESSION: 1. Negative for gallstones or biliary dilatation 2. Enlarged coarse heterogeneous nodular liver, consistent with hepatocellular disease/suspected cirrhosis. Bidirectional flow in the portal vein suggesting portal hypertension 3. Splenomegaly 4. Maximum AP diameter of abdominal aorta measuring 2.6 cm. Ectatic abdominal aorta at risk for aneurysm development. Recommend followup by ultrasound in 5 years. This recommendation follows ACR consensus guidelines: White Paper of the ACR Incidental Findings Committee II on Vascular Findings. J Am Coll Radiol  2013; 26:333-545. Electronically Signed   By: Donavan Foil M.D.   On: 06/15/2017 03:20   Rory Percy, DO 06/18/2017, 2:07 PM PGY-1, [place] Intern pager: [phone], text pages welcome

## 2017-06-18 NOTE — Progress Notes (Addendum)
NCM placed call to [place] spoke with Janett Billow regarding Medicaid/covered glucometer availability.  Janett Billow stated they would need Rx for Accu-Chek Aviva Plus.  Notified Diplomatic Services operational officer.  Kristen Cardinal, BSN, RN Case Manager [phone]  06/11/2017 5:35pm notified Butch Penny w/ [facility] of need so shower stool states they will bring.  Kristen Cardinal, BSN, RN Case Manager [phone]

## 2017-06-19 LAB — CBC WITH DIFFERENTIAL/PLATELET
BASOS ABS: 0 10*3/uL (ref 0.0–0.1)
Basophils Relative: 1 %
EOS ABS: 0.1 10*3/uL (ref 0.0–0.7)
Eosinophils Relative: 3 %
HCT: 32 % — ABNORMAL LOW (ref 36.0–46.0)
HEMOGLOBIN: 10.6 g/dL — AB (ref 12.0–15.0)
LYMPHS PCT: 20 %
Lymphs Abs: 0.5 10*3/uL — ABNORMAL LOW (ref 0.7–4.0)
MCH: 28.7 pg (ref 26.0–34.0)
MCHC: 33.1 g/dL (ref 30.0–36.0)
MCV: 86.7 fL (ref 78.0–100.0)
MONOS PCT: 8 %
Monocytes Absolute: 0.2 10*3/uL (ref 0.1–1.0)
NEUTROS ABS: 1.8 10*3/uL (ref 1.7–7.7)
Neutrophils Relative %: 68 %
Platelets: 49 10*3/uL — ABNORMAL LOW (ref 150–400)
RBC: 3.69 MIL/uL — AB (ref 3.87–5.11)
RDW: 14.4 % (ref 11.5–15.5)
WBC: 2.6 10*3/uL — ABNORMAL LOW (ref 4.0–10.5)

## 2017-06-19 LAB — GLUCOSE, CAPILLARY
GLUCOSE-CAPILLARY: 108 mg/dL — AB (ref 65–99)
GLUCOSE-CAPILLARY: 188 mg/dL — AB (ref 65–99)

## 2017-06-19 LAB — ANTI-SMOOTH MUSCLE ANTIBODY, IGG: F-ACTIN AB IGG: 18 U (ref 0–19)

## 2017-06-19 LAB — AFP TUMOR MARKER: AFP, Serum, Tumor Marker: 5.8 ng/mL (ref 0.0–8.3)

## 2017-06-19 MED ORDER — BENZONATATE 100 MG PO CAPS: 100.0000 mg | ORAL_CAPSULE | Freq: Three times a day (TID) | ORAL | 0 refills | Status: DC | PRN

## 2017-06-19 MED ORDER — BLOOD GLUCOSE MONITOR KIT: PACK | 0 refills | Status: DC

## 2017-06-19 MED ORDER — PROMETHAZINE-DM 6.25-15 MG/5ML PO SYRP: 5.0000 mL | ORAL_SOLUTION | Freq: Four times a day (QID) | ORAL | 0 refills | Status: DC | PRN

## 2017-06-19 MED ORDER — METFORMIN HCL 500 MG PO TABS: 500.0000 mg | ORAL_TABLET | Freq: Every day | ORAL | 0 refills | Status: DC

## 2017-06-19 NOTE — Discharge Instructions (Signed)
You were admitted for flu and low blood counts. Dr. Lebron Conners, your hematologist, started you on a medication to increase your blood counts. You were monitored for complications of the flu without complications. Follow up with your primary doctor next week.

## 2017-06-19 NOTE — Progress Notes (Signed)
Patient discharged home per MD. NSL discontinued with catheter intact. No bleeding. Discharge instructions provided to patient. MD notified for work note, prescriptions, and prescription for glucometer and kit. SW notified for taxi voucher. [facility] notified. Shower chair received. RN escorted patient out via wheelchair. Bartholomew Crews, RN

## 2017-06-19 NOTE — Progress Notes (Signed)
Family Medicine Teaching Service Daily Progress Note Intern Pager: [phone]  Patient name: Holly Mueller Medical record number: [id number] Date of birth: Jan 09, 1966 Age: 52 y.o. Gender: female  Primary Care Provider: [name], MD Consultants: None Code Status: Full  Pt Overview and Major Events to Date:  2/5 - admitted for neutropenic fever  Assessment and Plan: Holly Mueller is a 52 y.o. female presenting with fever, productive cough, sore throat x3 days. PMH is significant for chronic leukopenia and thromobcytopenia, hx of asthma, hx of seizures, depression, hypothyroidism, OA, GERD.    Neutropenic Fever d/t Flu  Chronic neutropenia/thrombocytopenia w/ h/o cirrhosis Sepsis picture resolved, VSS, afebrile. WBC 0.8>2.6. H/o chronic neutropenia and thrombocytopenia for the last 4-5 years, likely due to cirrhosis, evidenced from liver biopsy 2014. Cirrhosis w/u largely negative thus far. Awaiting autoimmune labs per GI, will likely not need to result prior to discharge. Known sick contacts, flu positive evidenced by RVP and flu panel, explaining current symptoms. Primary hematologist, Dr. Lebron Conners, seen this admission and started G-CSF to aid in stimulating neutrophil counts, patient will continue for 3 days or until [place] >2000 on 2 consecutive days or >5000 on any day. Currently day 3 of G-CSF. GI consulted who recommend further labs to rule out other causes of cirrhosis and will follow outpatient, now signed off. Patient feeling ok today, cough improved. Exam with increased bibasilar crackles however continues to be afebrile. Do not feel CXR would be of utility at this time as findings would lag behind. With clinical picture improved, can likely d/c today after last dose of G-CSF pending Heme/Onc recs. - s/p Vancomycin (2/5 - 2/6) and Cefepime (2/5 - 2/7) - continue Tamiflu (2/5 - 2/9) x 5 days - continue G-CSF 362mg per Heme/Onc - GI signed off - Heme/Onc following, appreciate  recs - f/u ANA, smooth muscle, alpha 1 AT, AFP labs - droplet precautions - monitor CBC, BMP - PT/OT - no follow up - tylenol PRN - zorfran PRN - tessalon for cough  New Onset Diabetes Glucose 321 on admission. No known history of diabetes. A1c this admission 9.1. 108 this am, after starting sSSI. 3u received last 24 hours. - CBG monitoring - sensitive SSI due to insulin naive - will likely be started on low dose Metformin on d/c  Hypotension, improved Improved s/p 1L bolus 2/7. Low normal this am. Am cortisol low at 1.3. Will defer steroids at this time due to acute illness. Looks like she is normally around 120-130s. - s/p 1L fluid bolus  Hypothyroidism TSH this admission 10.886, free T4 0.59. Takes Synthroid 2557m at home with good compliance. - continue home synthroid. - consider titrating dose as needed outpatient once over acute illness  Asthma vs. Restrictive Lung Disease Last PFT in 11/2016 with FEV1 1.54, FVC 1.8, with ratio 85. PFT report [facility] relative to the SVC indicating air trapping, lung volumes are reduced, and did not show significant response to bronchodilators indicating a restrictive process. Takes Flonase, albuterol, Zyrtec, Symbicort at home. Lungs without wheezing/rales/rhonchi this morning. - continue home meds per formulary  GERD Denies active symptoms. Possible eosinophilic esophagitis on EGD in 2015, dilated lower esophageal ring in lower 1/3 of esophagus. Takes Protonix at home - continue home meds  H/o seizures Last seizure in 1970s on chart review and previously treated with phenobarbital. Does not require chronic prophylaxis. - monitor  Depression Previously treated with Elavil but hasn't taken since 2013.  - Monitor  FEN/GI: Heart  Healthy/Carb modified Prophylaxis: SCDs due to thrombocytopenia  Disposition: can likely d/c today after last G-CSF  Subjective:  Patient feels ok today. Cough  improved.  Objective: Temp:  [97.4 F (36.3 C)-98.6 F (37 C)] 98.1 F (36.7 C) (02/09 0622) Pulse Rate:  [73-88] 74 (02/09 0831) Resp:  [18-22] 20 (02/09 0831) BP: (110-114)/(68-72) 110/71 (02/09 0622) SpO2:  [95 %-100 %] 96 % (02/09 0831) Weight:  [246 lb 4.1 oz (111.7 kg)] 246 lb 4.1 oz (111.7 kg) (02/08 2057)  Physical Exam: General: obese female in NAD, intermittent coughing, improved from yesterday. Clinically looks much improved from yesterday. Cardiovascular: RRR, no murmur/rubs/gallops Respiratory: CTAB, bibasilar crackles increased from yesterday. Intermittent coughing, not productive at this time Abdomen: soft, obese abdomen, +BS, nontender to palpation  Extremities: warm and well perfused  Laboratory: Recent Labs  Lab 06/17/17 0453 06/18/17 0537 06/19/17 0403  WBC 1.5* 2.3* 2.6*  HGB 10.7* 10.6* 10.6*  HCT 31.7* 31.7* 32.0*  PLT 52* 51* 49*   Recent Labs  Lab 06/14/17 1629 06/16/17 0537 06/17/17 0453 06/18/17 0537  NA 131* 134* 139 137  K 3.6 3.6 3.5 3.2*  CL 102 104 108 108  CO2 21* 20* 22 20*  BUN 6 5* 6 8  CREATININE 0.71 0.64 0.58 0.60  CALCIUM 8.5* 8.2* 8.1* 8.5*  PROT 7.8  --   --   --   BILITOT 2.8*  --   --   --   ALKPHOS 160*  --   --   --   ALT 54  --   --   --   AST 116*  --   --   --   GLUCOSE 321* 280* 157* 137*    Imaging/Diagnostic Tests: Dg Chest 2 View  Result Date: 06/17/2017 CLINICAL DATA:  cough EXAM: CHEST  2 VIEW FINDINGS: Normal cardiac silhouette. Fine interstitial linear lung pattern. No change from prior. Low lung volumes. No consolidation. No pneumothorax or pleural fluid. IMPRESSION: 1. No significant change. 2. Fine interstitial pattern suggests edema. Electronically Signed   By: Suzy Bouchard M.D.   On: 06/17/2017 10:15   Rory Percy, DO 06/19/2017, 9:37 AM PGY-1, [place] Intern pager: [phone], text pages welcome

## 2017-06-20 LAB — CULTURE, BLOOD (ROUTINE X 2)
CULTURE: NO GROWTH
CULTURE: NO GROWTH
SPECIAL REQUESTS: ADEQUATE
Special Requests: ADEQUATE

## 2017-06-21 MED FILL — ACCU-CHEK AVIVA PLUS W/DEVI: W/DEVICE | 1 days supply | Qty: 1 | Fill #0

## 2017-06-21 MED FILL — PROMETHAZINE-DM SYRUP: 6.25-15 | 6 days supply | Qty: 120 | Fill #0

## 2017-06-21 MED FILL — ACCU-CHEK SOFTCLIX LANCETS: 25 days supply | Qty: 100 | Fill #0

## 2017-06-21 MED FILL — BENZONATATE 100 MG CAPS: 100 | 13 days supply | Qty: 40 | Fill #0

## 2017-06-21 MED FILL — ACCU-CHEK AVIVA PLUS TEST S: 25 days supply | Qty: 100 | Fill #0

## 2017-06-21 MED FILL — metFORMIN HCL 500 MG TABS: 500 | 30 days supply | Qty: 30 | Fill #0

## 2017-06-22 ENCOUNTER — Telehealth: Payer: Self-pay

## 2017-06-22 NOTE — Telephone Encounter (Signed)
Called and left a detailed message of her upcoming appointment.per 2/11 los. Will mail calender also.

## 2017-06-24 ENCOUNTER — Encounter: Payer: Self-pay | Admitting: Internal Medicine

## 2017-06-24 ENCOUNTER — Ambulatory Visit (INDEPENDENT_AMBULATORY_CARE_PROVIDER_SITE_OTHER): Payer: Medicaid Other | Admitting: Internal Medicine

## 2017-06-24 ENCOUNTER — Other Ambulatory Visit: Payer: Self-pay

## 2017-06-24 VITALS — BP 126/78 | HR 79 | Temp 98.1°F | Ht 65.0 in | Wt 238.8 lb

## 2017-06-24 DIAGNOSIS — E039 Hypothyroidism, unspecified: Secondary | ICD-10-CM

## 2017-06-24 DIAGNOSIS — R319 Hematuria, unspecified: Secondary | ICD-10-CM | POA: Diagnosis present

## 2017-06-24 DIAGNOSIS — L259 Unspecified contact dermatitis, unspecified cause: Secondary | ICD-10-CM | POA: Diagnosis not present

## 2017-06-24 DIAGNOSIS — E119 Type 2 diabetes mellitus without complications: Secondary | ICD-10-CM | POA: Diagnosis not present

## 2017-06-24 MED ORDER — HYDROCORTISONE 2.5 % EX OINT: TOPICAL_OINTMENT | Freq: Two times a day (BID) | CUTANEOUS | 0 refills | Status: DC

## 2017-06-24 MED ORDER — METFORMIN HCL 500 MG PO TABS: 500.0000 mg | ORAL_TABLET | Freq: Two times a day (BID) | ORAL | 0 refills | Status: DC

## 2017-06-24 MED FILL — HYDROCORTISONE 2.5% OINT: 2.5 | 15 days supply | Qty: 28 | Fill #0

## 2017-06-24 NOTE — Patient Instructions (Addendum)
It was good to see you today. I am glad you're feeling better. Today we talked about:  1. Diabetes. Start taking Metformin 500 mg twice per day. I have sent in a new prescription but you can take a second dose of your current Rx to use it up first. Check your blood sugar first thing in the morning before eating anything and record that number.  2. Blood in your urine. I have placed a referral to the urologist.  3. Rash on your chest. Looks like it is from a contact irritant. I have prescribed a steroid cream.  4. Your thyroid labs. I think they should be rechecked in about 4 weeks.

## 2017-06-24 NOTE — Progress Notes (Signed)
Subjective:    Holly Mueller - 52 y.o. female MRN [id number]  Date of birth: 1965/09/28  HPI  Holly Mueller is here for hospital follow-up. See below for brief hospital course from discharge summary.    Influenza: Completed Tamiflu during hospitalization. Today, patient reports that she is feeling much better. Still has a lingering dry cough from her flu but has had no respiratory distress, fevers/chills, or chest pain.   T2DM: Patient was started on Metformin at discharge from hospital when A1c found to be 9.1%. She has been taking Metformin 500 mg daily without GI side effects. She has been checking her CBGs 3-4x per day. Numbers range from 180-300. She is unable to tell me what a typical fasting number is as she sometimes takes her morning CBG after eating.   Hematuria: Noted during hospitalization that patient has had persistent hematuria on previous UAs. She denies gross hematuria. Reports she has never smoked. No dysuria or pelvic pain.   Rash: Additionally, She reports having an itchy rash on her chest. She noticed it shortly after discharge from hospital. Has not tried putting anything on it. No new lotions, detergents, etc at home.   Brief Hospital Course:  Holly Mueller a 52 y.o.femalewith PMH significant for chronic leukopenia and thromobcytopenia, hx of asthma, hx of seizures, depression, hypothyroidism, OA, GERD who presented with fever, productive cough, sore throat x3 days consistent with neutropenic fever meeting sepsis criteria. In the ED, patient received 1L NS bolus, had blood and urine cultures as well as RVP drawn and started on Cefepime and Tamiflu. CXR showed bibasilar atelectasis with no effusions or consolidation noted, although difficult to view due to hypoinflation. WBC 0.8. RVP resulted positive for Influenza A and continued on Tamiflu throughout this admission for total of 5 days.   Patient has history of chronic neutropenia and thrombocytopenia for the  last 4-5 years, with extensive outpatient Heme/Onc workup including bone marrow biopsy negative for myelodysplasia or leukemia.Also has chronically elevated LFTs with liver biopsy in 2014 consistent with cirrhosis. PT/INR with elevated PT 16.8. Abdominal US this admission revealed enlarged liver and spleen. Nodular contour of liver seen consistent with cirrhosis. Unclear etiology of cirrhosis as patient has previous negative hepatitis panel and no h/o alcohol abuse, likely due to fatty liver. Patient's hematologist, Dr. Lebron Conners evaluated this admission who started a colony stimulating factor to increase neutrophil counts and will follow outpatient. WBC increased to 2.6 prior to discharge. GI also consulted this admission who ordered autoimmune labs and will follow outpatient.  Patient also with elevated glucose on admission, A1c 9.1 this admission. Blood sugars managed with sensitive sliding scale insulin, will start Metformin on discharge. Patient's TSH checked this admission and elevated with free T4 slightly below normal. Likely due to acute illness, recommend recheck once over acute illness and titrating dose as needed.   -  reports that she quit smoking about 22 years ago. Her smoking use included cigarettes. She smoked 0.25 packs per day. she has never used smokeless tobacco. - Review of Systems: Per HPI. - Past Medical History: Patient Active Problem List   Diagnosis Date Noted  . Bibasilar crackles   . Influenza-like illness   . Influenza A   . Neutropenic fever ([place]) 06/14/2017  . Cirrhosis of liver without ascites ([place]) 11/17/2016  . Thrombocytopenia ([place]) 11/17/2016  . Chronic leukopenia 11/17/2016  . Bicytopenia 10/24/2016  . Epistaxis 10/23/2016  . GERD (gastroesophageal reflux disease) 10/23/2016  . Cough 12/09/2015  .  Chronic pain of left thumb 10/09/2015  . Precordial chest pain 08/03/2015  . Abscess of skin of abdomen 07/10/2014  . Bursitis of left hip 09/14/2013  . Lower  esophageal ring 08/18/2013  . Dyspnea 08/17/2013  . Ganglion cyst of wrist 08/24/2012  . Hip pain, bilateral 08/23/2012  . Umbilical hernia 47/42/5956  . Healthcare maintenance 02/14/2012  . Osteoarthritis 02/12/2012  . Elevated LFTs 10/30/2011  . Allergic rhinitis 08/26/2011  . Obesity 07/08/2010  . SEIZURES, HX OF 12/12/2009  . DENTAL CARIES 12/18/2008  . DEPRESSION 07/26/2008  . MIGRAINE HEADACHE 11/15/2007  . Hypothyroidism 10/12/2006  . Asthma 07/30/2006   - Medications: reviewed and updated   Objective:   Physical Exam BP 126/78   Pulse 79   Temp 98.1 F (36.7 C) (Oral)   Ht _0  (1.651 m)   Wt 238 lb 12.8 oz (108.3 kg)   SpO2 94%   BMI 39.74 kg/m  Gen: NAD, alert, cooperative with exam, well-appearing CV: RRR, good S1/S2, no murmur, no edema, capillary refill brisk  Resp: CTABL, no wheezes, non-labored, occasional dry cough  Skin: scattered erythematous macules across chest with a few excoriations present      Assessment & Plan:   1. Hematuria, unspecified type Patient with microscopic hematuria on UAs from 2014- now. Patient reports she has never smoked but social history reports her as previous smoker quit >20 years ago. Repeated microscopic hematuria warrants work up by urology.  - Ambulatory referral to Urology  2. Contact dermatitis, unspecified contact dermatitis type, unspecified trigger Rash appears most consistent with contact dermatitis. May have been EKG leads or hospital gown during recent hospitalization. Willl treat with steroid cream. If does not improve, patient to return.  - hydrocortisone 2.5 % ointment; Apply topically 2 (two) times daily.  Dispense: 30 g; Refill: 0  3. Hypothyroidism, unspecified type Elevated TSH to 10.8 and low T4 at 0.59 on 2/5 during hospitalization. Likely due to acute illness. Patient to continue Synthroid 250 mcg daily. Would repeat in 4 weeks.   4. Type 2 diabetes mellitus without complication, without long-term  current use of insulin ([address]) Patient with continued elevated CBGs at home. Will increase Metformin to 500 mg BID as patient has tolerated this past week without GI side effects. Have recommended recording fasting CBG daily. Will likely need further increase of Metformin.  - metFORMIN (GLUCOPHAGE) 500 MG tablet; Take 1 tablet (500 mg total) by mouth 2 (two) times daily with a meal.  Dispense: 180 tablet; Refill: 0  Follow up with PCP in 4 weeks for thyroid labs and DM follow up. Also noted that patient is overdue for several health maintenance topics. Will defer to PCP.   Phill Myron, D.O. 06/24/2017, 8:54 AM PGY-3, [place]

## 2017-06-29 ENCOUNTER — Other Ambulatory Visit: Payer: Self-pay

## 2017-06-29 ENCOUNTER — Emergency Department (HOSPITAL_COMMUNITY): Payer: Medicaid Other

## 2017-06-29 ENCOUNTER — Inpatient Hospital Stay (HOSPITAL_COMMUNITY)
Admission: EM | Admit: 2017-06-29 | Discharge: 2017-07-04 | DRG: 603 | Disposition: A | Discharge status: Home Health | Payer: Medicaid Other | Attending: Family Medicine | Admitting: Family Medicine

## 2017-06-29 ENCOUNTER — Emergency Department (HOSPITAL_COMMUNITY)
Admit: 2017-06-29 | Discharge: 2017-06-29 | Disposition: A | Discharge status: Home / Self Care | Payer: Medicaid Other | Attending: Emergency Medicine | Admitting: Emergency Medicine

## 2017-06-29 ENCOUNTER — Encounter (HOSPITAL_COMMUNITY): Payer: Self-pay

## 2017-06-29 DIAGNOSIS — R569 Unspecified convulsions: Secondary | ICD-10-CM | POA: Diagnosis present

## 2017-06-29 DIAGNOSIS — L03116 Cellulitis of left lower limb: Secondary | ICD-10-CM

## 2017-06-29 DIAGNOSIS — J45909 Unspecified asthma, uncomplicated: Secondary | ICD-10-CM | POA: Diagnosis present

## 2017-06-29 DIAGNOSIS — K219 Gastro-esophageal reflux disease without esophagitis: Secondary | ICD-10-CM | POA: Diagnosis present

## 2017-06-29 DIAGNOSIS — F329 Major depressive disorder, single episode, unspecified: Secondary | ICD-10-CM | POA: Diagnosis present

## 2017-06-29 DIAGNOSIS — D61818 Other pancytopenia: Secondary | ICD-10-CM | POA: Diagnosis present

## 2017-06-29 DIAGNOSIS — M79605 Pain in left leg: Secondary | ICD-10-CM

## 2017-06-29 DIAGNOSIS — R0902 Hypoxemia: Secondary | ICD-10-CM | POA: Diagnosis present

## 2017-06-29 DIAGNOSIS — E039 Hypothyroidism, unspecified: Secondary | ICD-10-CM | POA: Diagnosis present

## 2017-06-29 DIAGNOSIS — M79609 Pain in unspecified limb: Secondary | ICD-10-CM | POA: Diagnosis not present

## 2017-06-29 DIAGNOSIS — D72819 Decreased white blood cell count, unspecified: Secondary | ICD-10-CM | POA: Diagnosis present

## 2017-06-29 DIAGNOSIS — E876 Hypokalemia: Secondary | ICD-10-CM | POA: Diagnosis not present

## 2017-06-29 DIAGNOSIS — Z79899 Other long term (current) drug therapy: Secondary | ICD-10-CM

## 2017-06-29 DIAGNOSIS — E119 Type 2 diabetes mellitus without complications: Secondary | ICD-10-CM | POA: Diagnosis present

## 2017-06-29 DIAGNOSIS — K7030 Alcoholic cirrhosis of liver without ascites: Secondary | ICD-10-CM | POA: Diagnosis present

## 2017-06-29 DIAGNOSIS — J810 Acute pulmonary edema: Secondary | ICD-10-CM

## 2017-06-29 DIAGNOSIS — D6959 Other secondary thrombocytopenia: Secondary | ICD-10-CM | POA: Diagnosis present

## 2017-06-29 DIAGNOSIS — Z7984 Long term (current) use of oral hypoglycemic drugs: Secondary | ICD-10-CM

## 2017-06-29 DIAGNOSIS — J811 Chronic pulmonary edema: Secondary | ICD-10-CM | POA: Diagnosis present

## 2017-06-29 DIAGNOSIS — Z87891 Personal history of nicotine dependence: Secondary | ICD-10-CM

## 2017-06-29 DIAGNOSIS — Z886 Allergy status to analgesic agent status: Secondary | ICD-10-CM

## 2017-06-29 LAB — CBC WITH DIFFERENTIAL/PLATELET
Basophils Absolute: 0 10*3/uL (ref 0.0–0.1)
Basophils Relative: 1 %
Eosinophils Absolute: 0 10*3/uL (ref 0.0–0.7)
Eosinophils Relative: 1 %
HCT: 37.4 % (ref 36.0–46.0)
Hemoglobin: 12.8 g/dL (ref 12.0–15.0)
Lymphocytes Relative: 22 %
Lymphs Abs: 0.5 10*3/uL — ABNORMAL LOW (ref 0.7–4.0)
MCH: 29.5 pg (ref 26.0–34.0)
MCHC: 34.2 g/dL (ref 30.0–36.0)
MCV: 86.2 fL (ref 78.0–100.0)
Monocytes Absolute: 0.3 10*3/uL (ref 0.1–1.0)
Monocytes Relative: 11 %
Neutro Abs: 1.6 10*3/uL — ABNORMAL LOW (ref 1.7–7.7)
Neutrophils Relative %: 65 %
Platelets: 90 10*3/uL — ABNORMAL LOW (ref 150–400)
RBC: 4.34 MIL/uL (ref 3.87–5.11)
RDW: 14.6 % (ref 11.5–15.5)
WBC: 2.4 10*3/uL — ABNORMAL LOW (ref 4.0–10.5)

## 2017-06-29 LAB — BASIC METABOLIC PANEL
Anion gap: 10 (ref 5–15)
BUN: 9 mg/dL (ref 6–20)
CO2: 17 mmol/L — ABNORMAL LOW (ref 22–32)
Calcium: 9.1 mg/dL (ref 8.9–10.3)
Chloride: 108 mmol/L (ref 101–111)
Creatinine, Ser: 0.62 mg/dL (ref 0.44–1.00)
GFR calc Af Amer: >60 mL/min (ref >60)
GFR calc non Af Amer: >60 mL/min (ref >60)
Glucose, Bld: 136 mg/dL — ABNORMAL HIGH (ref 65–99)
Potassium: 3.9 mmol/L (ref 3.5–5.1)
Sodium: 135 mmol/L (ref 135–145)

## 2017-06-29 LAB — I-STAT CG4 LACTIC ACID, ED: Lactic Acid, Venous: 1.32 mmol/L (ref 0.5–1.9)

## 2017-06-29 MED ORDER — ACETAMINOPHEN 325 MG PO TABS
650.0000 mg | ORAL_TABLET | Freq: Once | ORAL | Status: AC
  Administered 2017-06-29: 650 mg via ORAL
  Filled 2017-06-29: qty 2

## 2017-06-29 NOTE — ED Notes (Signed)
Pt taken to bathroom via wheelchair, pt unable to bear weight to L leg, returned to room, pt tolerated well.

## 2017-06-29 NOTE — Progress Notes (Signed)
Left lower extremity venous duplex has been completed. Negative for obvious evidence of DVT. Results were given to Carmon Sails PA.  06/29/17 7:29 PM Carlos Levering RVT

## 2017-06-29 NOTE — ED Triage Notes (Signed)
Pt arrives via ems with complaints of LLE pain x 2 days. Denies trauma/ injury. No redness, swelling, warmth. Tender to touch

## 2017-06-29 NOTE — ED Notes (Signed)
Pt called 3x to recheck vitals. No response.

## 2017-06-30 ENCOUNTER — Inpatient Hospital Stay (HOSPITAL_COMMUNITY): Payer: Medicaid Other

## 2017-06-30 ENCOUNTER — Encounter (HOSPITAL_COMMUNITY): Payer: Self-pay | Admitting: Emergency Medicine

## 2017-06-30 DIAGNOSIS — E119 Type 2 diabetes mellitus without complications: Secondary | ICD-10-CM

## 2017-06-30 DIAGNOSIS — Z7984 Long term (current) use of oral hypoglycemic drugs: Secondary | ICD-10-CM | POA: Diagnosis not present

## 2017-06-30 DIAGNOSIS — E039 Hypothyroidism, unspecified: Secondary | ICD-10-CM | POA: Diagnosis present

## 2017-06-30 DIAGNOSIS — F329 Major depressive disorder, single episode, unspecified: Secondary | ICD-10-CM | POA: Diagnosis present

## 2017-06-30 DIAGNOSIS — M79605 Pain in left leg: Secondary | ICD-10-CM | POA: Diagnosis not present

## 2017-06-30 DIAGNOSIS — K7030 Alcoholic cirrhosis of liver without ascites: Secondary | ICD-10-CM | POA: Diagnosis present

## 2017-06-30 DIAGNOSIS — Z87891 Personal history of nicotine dependence: Secondary | ICD-10-CM | POA: Diagnosis not present

## 2017-06-30 DIAGNOSIS — L03116 Cellulitis of left lower limb: Secondary | ICD-10-CM | POA: Diagnosis present

## 2017-06-30 DIAGNOSIS — K219 Gastro-esophageal reflux disease without esophagitis: Secondary | ICD-10-CM | POA: Diagnosis present

## 2017-06-30 DIAGNOSIS — E876 Hypokalemia: Secondary | ICD-10-CM | POA: Diagnosis not present

## 2017-06-30 DIAGNOSIS — R569 Unspecified convulsions: Secondary | ICD-10-CM | POA: Diagnosis present

## 2017-06-30 DIAGNOSIS — J811 Chronic pulmonary edema: Secondary | ICD-10-CM | POA: Diagnosis present

## 2017-06-30 DIAGNOSIS — Z79899 Other long term (current) drug therapy: Secondary | ICD-10-CM | POA: Diagnosis not present

## 2017-06-30 DIAGNOSIS — Z886 Allergy status to analgesic agent status: Secondary | ICD-10-CM | POA: Diagnosis not present

## 2017-06-30 DIAGNOSIS — D709 Neutropenia, unspecified: Secondary | ICD-10-CM | POA: Diagnosis not present

## 2017-06-30 DIAGNOSIS — I5030 Unspecified diastolic (congestive) heart failure: Secondary | ICD-10-CM | POA: Diagnosis not present

## 2017-06-30 DIAGNOSIS — J810 Acute pulmonary edema: Secondary | ICD-10-CM | POA: Diagnosis present

## 2017-06-30 DIAGNOSIS — D72819 Decreased white blood cell count, unspecified: Secondary | ICD-10-CM | POA: Diagnosis present

## 2017-06-30 DIAGNOSIS — J45909 Unspecified asthma, uncomplicated: Secondary | ICD-10-CM | POA: Diagnosis present

## 2017-06-30 DIAGNOSIS — D6959 Other secondary thrombocytopenia: Secondary | ICD-10-CM | POA: Diagnosis present

## 2017-06-30 DIAGNOSIS — R0902 Hypoxemia: Secondary | ICD-10-CM | POA: Diagnosis present

## 2017-06-30 DIAGNOSIS — D61818 Other pancytopenia: Secondary | ICD-10-CM | POA: Diagnosis present

## 2017-06-30 LAB — BASIC METABOLIC PANEL
Anion gap: 8 (ref 5–15)
BUN: 10 mg/dL (ref 6–20)
CHLORIDE: 105 mmol/L (ref 101–111)
CO2: 20 mmol/L — ABNORMAL LOW (ref 22–32)
Calcium: 8.7 mg/dL — ABNORMAL LOW (ref 8.9–10.3)
Creatinine, Ser: 0.82 mg/dL (ref 0.44–1.00)
GFR calc Af Amer: >60 mL/min (ref >60)
GFR calc non Af Amer: >60 mL/min (ref >60)
GLUCOSE: 127 mg/dL — AB (ref 65–99)
POTASSIUM: 3.5 mmol/L (ref 3.5–5.1)
SODIUM: 133 mmol/L — AB (ref 135–145)

## 2017-06-30 LAB — URINALYSIS, ROUTINE W REFLEX MICROSCOPIC
Bilirubin Urine: NEGATIVE
GLUCOSE, UA: NEGATIVE mg/dL
KETONES UR: NEGATIVE mg/dL
Leukocytes, UA: NEGATIVE
NITRITE: NEGATIVE
PROTEIN: NEGATIVE mg/dL
Specific Gravity, Urine: 1.017 (ref 1.005–1.030)
pH: 6 (ref 5.0–8.0)

## 2017-06-30 LAB — I-STAT TROPONIN, ED: Troponin i, poc: 0 ng/mL (ref 0.00–0.08)

## 2017-06-30 LAB — RAPID URINE DRUG SCREEN, HOSP PERFORMED
Amphetamines: NOT DETECTED
BENZODIAZEPINES: NOT DETECTED
Barbiturates: NOT DETECTED
Cocaine: NOT DETECTED
Opiates: NOT DETECTED
Tetrahydrocannabinol: NOT DETECTED

## 2017-06-30 LAB — CBC
HEMATOCRIT: 33.9 % — AB (ref 36.0–46.0)
Hemoglobin: 11.7 g/dL — ABNORMAL LOW (ref 12.0–15.0)
MCH: 30.2 pg (ref 26.0–34.0)
MCHC: 34.5 g/dL (ref 30.0–36.0)
MCV: 87.4 fL (ref 78.0–100.0)
Platelets: 86 10*3/uL — ABNORMAL LOW (ref 150–400)
RBC: 3.88 MIL/uL (ref 3.87–5.11)
RDW: 15.2 % (ref 11.5–15.5)
WBC: 3 10*3/uL — AB (ref 4.0–10.5)

## 2017-06-30 LAB — ECHOCARDIOGRAM COMPLETE
Height: 66 in
Weight: 3792 oz

## 2017-06-30 LAB — GLUCOSE, CAPILLARY
GLUCOSE-CAPILLARY: 132 mg/dL — AB (ref 65–99)
GLUCOSE-CAPILLARY: 153 mg/dL — AB (ref 65–99)
GLUCOSE-CAPILLARY: 128 mg/dL — AB (ref 65–99)
Glucose-Capillary: 155 mg/dL — ABNORMAL HIGH (ref 65–99)
Glucose-Capillary: 119 mg/dL — ABNORMAL HIGH (ref 65–99)

## 2017-06-30 LAB — TROPONIN I: Troponin I: <0.03 ng/mL (ref <0.03)

## 2017-06-30 LAB — I-STAT CG4 LACTIC ACID, ED: Lactic Acid, Venous: 1.46 mmol/L (ref 0.5–1.9)

## 2017-06-30 LAB — BRAIN NATRIURETIC PEPTIDE: B Natriuretic Peptide: 19.3 pg/mL (ref 0.0–100.0)

## 2017-06-30 MED ORDER — FLUTICASONE PROPIONATE 50 MCG/ACT NA SUSP
2.0000 | Freq: Every day | NASAL | Status: DC
  Administered 2017-06-30 – 2017-07-04 (×5): 2 via NASAL
  Filled 2017-06-30: qty 16

## 2017-06-30 MED ORDER — VANCOMYCIN HCL IN DEXTROSE 1-5 GM/200ML-% IV SOLN
1000.0000 mg | Freq: Once | INTRAVENOUS | Status: AC
Start: 2017-06-30 — End: 2017-06-30
  Administered 2017-06-30: 1000 mg via INTRAVENOUS
  Filled 2017-06-30: qty 200

## 2017-06-30 MED ORDER — LIVING WELL WITH DIABETES BOOK
Freq: Once | Status: DC
  Filled 2017-06-30: qty 1

## 2017-06-30 MED ORDER — LEVOTHYROXINE SODIUM 50 MCG PO TABS
250.0000 ug | ORAL_TABLET | Freq: Every day | ORAL | Status: DC
  Administered 2017-06-30 – 2017-07-04 (×5): 250 ug via ORAL
  Filled 2017-06-30: qty 2
  Filled 2017-06-30: qty 1
  Filled 2017-06-30 (×3): qty 2

## 2017-06-30 MED ORDER — FOLIC ACID 1 MG PO TABS
1.0000 mg | ORAL_TABLET | Freq: Every day | ORAL | Status: DC
  Administered 2017-06-30 – 2017-07-01 (×2): 1 mg via ORAL
  Filled 2017-06-30 (×2): qty 1

## 2017-06-30 MED ORDER — ADULT MULTIVITAMIN W/MINERALS CH
1.0000 | ORAL_TABLET | Freq: Every day | ORAL | Status: DC
  Administered 2017-06-30 – 2017-07-01 (×2): 1 via ORAL
  Filled 2017-06-30 (×2): qty 1

## 2017-06-30 MED ORDER — THIAMINE HCL 100 MG/ML IJ SOLN: 100.0000 mg | Freq: Every day | INTRAMUSCULAR | Status: DC

## 2017-06-30 MED ORDER — SODIUM CHLORIDE 0.9% FLUSH: 3.0000 mL | INTRAVENOUS | Status: DC | PRN

## 2017-06-30 MED ORDER — INSULIN NPH (HUMAN) (ISOPHANE) 100 UNIT/ML ~~LOC~~ SUSP
0.0000 [IU] | Freq: Three times a day (TID) | SUBCUTANEOUS | Status: DC
  Administered 2017-07-01 – 2017-07-03 (×2): 2 [IU] via SUBCUTANEOUS
  Filled 2017-06-30: qty 10

## 2017-06-30 MED ORDER — LORAZEPAM 1 MG PO TABS: 1.0000 mg | ORAL_TABLET | Freq: Four times a day (QID) | ORAL | Status: DC | PRN

## 2017-06-30 MED ORDER — KETOROLAC TROMETHAMINE 30 MG/ML IJ SOLN
15.0000 mg | Freq: Once | INTRAMUSCULAR | Status: AC
Start: 2017-06-30 — End: 2017-06-30
  Administered 2017-06-30: 15 mg via INTRAVENOUS
  Filled 2017-06-30: qty 1

## 2017-06-30 MED ORDER — ENOXAPARIN SODIUM 40 MG/0.4ML ~~LOC~~ SOLN
40.0000 mg | Freq: Every day | SUBCUTANEOUS | Status: DC
  Administered 2017-06-30 – 2017-07-04 (×5): 40 mg via SUBCUTANEOUS
  Filled 2017-06-30 (×5): qty 0.4

## 2017-06-30 MED ORDER — SODIUM CHLORIDE 0.9% FLUSH
3.0000 mL | Freq: Two times a day (BID) | INTRAVENOUS | Status: DC
  Administered 2017-06-30 – 2017-07-04 (×8): 3 mL via INTRAVENOUS

## 2017-06-30 MED ORDER — ONDANSETRON HCL 4 MG PO TABS: 4.0000 mg | ORAL_TABLET | Freq: Four times a day (QID) | ORAL | Status: DC | PRN

## 2017-06-30 MED ORDER — LORAZEPAM 2 MG/ML IJ SOLN: 1.0000 mg | Freq: Four times a day (QID) | INTRAMUSCULAR | Status: DC | PRN

## 2017-06-30 MED ORDER — ORAL CARE MOUTH RINSE
15.0000 mL | Freq: Two times a day (BID) | OROMUCOSAL | Status: DC
  Administered 2017-06-30 – 2017-07-04 (×6): 15 mL via OROMUCOSAL

## 2017-06-30 MED ORDER — INSULIN ASPART 100 UNIT/ML ~~LOC~~ SOLN
0.0000 [IU] | Freq: Three times a day (TID) | SUBCUTANEOUS | Status: DC
  Administered 2017-07-01 – 2017-07-03 (×5): 1 [IU] via SUBCUTANEOUS
  Administered 2017-07-03: 2 [IU] via SUBCUTANEOUS
  Administered 2017-07-04: 1 [IU] via SUBCUTANEOUS

## 2017-06-30 MED ORDER — LORATADINE 10 MG PO TABS
10.0000 mg | ORAL_TABLET | Freq: Every day | ORAL | Status: DC
  Administered 2017-06-30 – 2017-07-04 (×5): 10 mg via ORAL
  Filled 2017-06-30 (×5): qty 1

## 2017-06-30 MED ORDER — ACETAMINOPHEN 650 MG RE SUPP: 650.0000 mg | Freq: Four times a day (QID) | RECTAL | Status: DC | PRN

## 2017-06-30 MED ORDER — PIPERACILLIN-TAZOBACTAM 3.375 G IVPB 30 MIN
3.3750 g | Freq: Once | INTRAVENOUS | Status: AC
  Administered 2017-06-30: 3.375 g via INTRAVENOUS
  Filled 2017-06-30: qty 50

## 2017-06-30 MED ORDER — ACETAMINOPHEN 325 MG PO TABS
650.0000 mg | ORAL_TABLET | Freq: Four times a day (QID) | ORAL | Status: DC | PRN
  Administered 2017-06-30 – 2017-07-03 (×7): 650 mg via ORAL
  Filled 2017-06-30 (×8): qty 2

## 2017-06-30 MED ORDER — ONDANSETRON HCL 4 MG/2ML IJ SOLN
4.0000 mg | Freq: Four times a day (QID) | INTRAMUSCULAR | Status: DC | PRN
Start: 2017-06-30 — End: 2017-07-04

## 2017-06-30 MED ORDER — VITAMIN B-1 100 MG PO TABS
100.0000 mg | ORAL_TABLET | Freq: Every day | ORAL | Status: DC
  Administered 2017-06-30 – 2017-07-01 (×2): 100 mg via ORAL
  Filled 2017-06-30 (×2): qty 1

## 2017-06-30 MED ORDER — MOMETASONE FURO-FORMOTEROL FUM 200-5 MCG/ACT IN AERO
2.0000 | INHALATION_SPRAY | Freq: Two times a day (BID) | RESPIRATORY_TRACT | Status: DC
Start: 2017-06-30 — End: 2017-07-04
  Administered 2017-06-30 – 2017-07-04 (×9): 2 via RESPIRATORY_TRACT
  Filled 2017-06-30: qty 8.8

## 2017-06-30 MED ORDER — PANTOPRAZOLE SODIUM 40 MG PO TBEC
40.0000 mg | DELAYED_RELEASE_TABLET | Freq: Every day | ORAL | Status: DC
  Administered 2017-06-30 – 2017-07-04 (×5): 40 mg via ORAL
  Filled 2017-06-30 (×5): qty 1

## 2017-06-30 MED ORDER — BENZONATATE 100 MG PO CAPS
100.0000 mg | ORAL_CAPSULE | Freq: Three times a day (TID) | ORAL | Status: DC | PRN
  Administered 2017-06-30 – 2017-07-04 (×5): 100 mg via ORAL
  Filled 2017-06-30 (×5): qty 1

## 2017-06-30 MED ORDER — SODIUM CHLORIDE 0.9 % IV SOLN: 250.0000 mL | INTRAVENOUS | Status: DC | PRN

## 2017-06-30 MED ORDER — CEPHALEXIN 500 MG PO CAPS
500.0000 mg | ORAL_CAPSULE | Freq: Four times a day (QID) | ORAL | Status: DC
  Administered 2017-06-30 – 2017-07-02 (×7): 500 mg via ORAL
  Filled 2017-06-30 (×7): qty 1

## 2017-06-30 MED ORDER — DOCUSATE SODIUM 100 MG PO CAPS
100.0000 mg | ORAL_CAPSULE | Freq: Two times a day (BID) | ORAL | Status: DC
  Administered 2017-06-30 – 2017-07-04 (×7): 100 mg via ORAL
  Filled 2017-06-30 (×9): qty 1

## 2017-06-30 MED ORDER — INSULIN ASPART 100 UNIT/ML ~~LOC~~ SOLN
0.0000 [IU] | Freq: Three times a day (TID) | SUBCUTANEOUS | Status: DC
Start: 2017-06-30 — End: 2017-06-30
  Administered 2017-06-30: 2 [IU] via SUBCUTANEOUS
  Administered 2017-06-30: 1 [IU] via SUBCUTANEOUS

## 2017-06-30 MED ORDER — IOPAMIDOL (ISOVUE-370) INJECTION 76%
INTRAVENOUS | Status: AC
  Administered 2017-06-30: 100 mL
  Filled 2017-06-30: qty 100

## 2017-06-30 NOTE — Evaluation (Signed)
Physical Therapy Evaluation Patient Details Name: Holly Mueller MRN: [id number] DOB: 1965-12-26 Today's Date: 06/30/2017   History of Present Illness  Holly Mueller is a 52 y.o. female presenting with leg pain x 2 days. On admission pulmonary edema was found on CXR. PMH is significant for chronic leukopenia and thromobcytopenia, diabetes, hx of asthma, hx of seizures, depression, hypothyroidism, OA, GERD.  Clinical Impression  Pt mobility greatly limited by L LE pain. Pt was indep with ADLs and mobility but now requires RW to ambulate. Pt with minimal L LE WBing tolerance due to pain. L calf with noted swelling and redness behind the knee. Pt with good home set up and support. Will need to complete stair negotiation prior to d/c home for safe entry.    Follow Up Recommendations No PT follow up;Supervision/Assistance - 24 hour    Equipment Recommendations  Rolling walker with 5" wheels    Recommendations for Other Services       Precautions / Restrictions Precautions Precautions: Fall Precaution Comments: L LE pain Restrictions Weight Bearing Restrictions: No      Mobility  Bed Mobility Overal bed mobility: Modified Independent             General bed mobility comments: used bed rail to elevated trunk  Transfers Overall transfer level: Needs assistance Equipment used: Rolling walker (2 wheeled) Transfers: Sit to/from Stand Sit to Stand: Min assist         General transfer comment: v/c's to push up from bed, pt with minimal L LE WBing due to pain, minA for initial power up and to steady during tranistion of hands from bed to RW  Ambulation/Gait Ambulation/Gait assistance: Min guard Ambulation Distance (Feet): 10 Feet(from one side of bed to the other) Assistive device: Rolling walker (2 wheeled) Gait Pattern/deviations: Step-to pattern;Decreased weight shift to left;Decreased stance time - left Gait velocity: decreased Gait velocity interpretation: Below normal  speed for age/gender General Gait Details: pt with minimal L LE WBing, unable to place L foot flat, WBing on ball of foot, dependent on bilat UEs  Stairs            Wheelchair Mobility    Modified Rankin (Stroke Patients Only)       Balance Overall balance assessment: Needs assistance Sitting-balance support: No upper extremity supported Sitting balance-Leahy Scale: Good     Standing balance support: During functional activity Standing balance-Leahy Scale: Poor Standing balance comment: depenedent on RW due to L LE pain in wbing                             Pertinent Vitals/Pain Pain Assessment: 0-10 Pain Score: 7  Pain Location: L calf, worse with movement, better at rest Pain Descriptors / Indicators: Discomfort;Sore;Throbbing Pain Intervention(s): Monitored during session    Home Living Family/patient expects to be discharged to:: Private residence Living Arrangements: Children;Non-relatives/Friends Available Help at Discharge: Family;Friend(s);Available 24 hours/day Type of Home: House Home Access: Stairs to enter Entrance Stairs-Rails: None Entrance Stairs-Number of Steps: 2 Home Layout: One level Home Equipment: None Additional Comments: roomate works during the day, kids are 76yo and 61 yo, not in school, don't work    Prior Function Level of Independence: Independent         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[employer], drilling   Dominant Hand: Right    Extremity/Trunk Assessment   Upper Extremity Assessment Upper Extremity Assessment: Overall WFL for  tasks assessed    Lower Extremity Assessment Lower Extremity Assessment: LLE deficits/detail LLE Deficits / Details: very sensitive to touch, knee and ankle ROM decreased due to pain at calf,[facility]    Cervical / Trunk Assessment Cervical / Trunk Assessment: Normal  Communication   Communication: No difficulties  Cognition Arousal/Alertness: Awake/alert Behavior During  Therapy: WFL for tasks assessed/performed Overall Cognitive Status: Within Functional Limits for tasks assessed                                        General Comments General comments (skin integrity, edema, etc.): mild redness posterior L knee, noted swelling at L calf    Exercises     Assessment/Plan    PT Assessment Patient needs continued PT services  PT Problem List Decreased strength;Decreased activity tolerance;Decreased balance;Decreased mobility;Decreased knowledge of use of DME;Decreased safety awareness;Pain       PT Treatment Interventions DME instruction;Gait training;Stair training;Functional mobility training;Therapeutic activities;Therapeutic exercise;Neuromuscular re-education;Balance training;Patient/family education    PT Goals (Current goals can be found in the Care Plan section)  Acute Rehab PT Goals Patient Stated Goal: improve L LE PT Goal Formulation: With patient Time For Goal Achievement: 07/14/17 Potential to Achieve Goals: Good    Frequency Min 3X/week   Barriers to discharge        Co-evaluation               AM-PAC PT "6 Clicks" Daily Activity  Outcome Measure Difficulty turning over in bed (including adjusting bedclothes, sheets and blankets)?: None Difficulty moving from lying on back to sitting on the side of the bed? : None Difficulty sitting down on and standing up from a chair with arms (e.g., wheelchair, bedside commode, etc,.)?: Unable Help needed moving to and from a bed to chair (including a wheelchair)?: A Lot Help needed walking in hospital room?: A Lot Help needed climbing 3-5 steps with a railing? : A Lot 6 Click Score: 15    End of Session Equipment Utilized During Treatment: Gait belt Activity Tolerance: Patient limited by fatigue Patient left: in chair;with call bell/phone within reach Nurse Communication: Mobility status PT Visit Diagnosis: Other abnormalities of gait and mobility (R26.89)     Time: 5110-2111 PT Time Calculation (min) (ACUTE ONLY): 16 min   Charges:   PT Evaluation $PT Eval Moderate Complexity: 1 Mod     PT G CodesKittie Plater, PT, DPT Pager #: [phone] Office #: [phone]   [place] 06/30/2017, 9:45 AM

## 2017-06-30 NOTE — Progress Notes (Signed)
Patient arrived to 5W15 from ED. Patient A&Ox4 and telemetry box 19 applied to patient and second verified by Sonia Baller T.,RN. Skin assessment completed by 2 RNs. Patient oriented to room and instructed with teachback on how to use the call bell and phone. Patient brought in medications from home. This RN and Ivana,RN counted the medications and took them to pharmacy.This RN explained to the patient that she would receive her medications back when she was discharged. Patient resting in bed comfortably at this time and requesting applesauce and sprite zero. No expression of pain at this time from patient. Will continue to monitor and treat per MD orders.

## 2017-06-30 NOTE — Progress Notes (Signed)
  Echocardiogram 2D Echocardiogram has been performed.  Merrie Roof F 06/30/2017, 3:55 PM

## 2017-06-30 NOTE — ED Notes (Signed)
Pt given Sprite Zero and applesauce per request.

## 2017-06-30 NOTE — Progress Notes (Signed)
OT Cancellation Note  Patient Details Name: Holly Mueller MRN: [id number] DOB: 1965/12/22   Cancelled Treatment:    Reason Eval/Treat Not Completed: Patient at procedure or test/ unavailable(Echo). Will follow.  Malka So 06/30/2017, 2:58 PM  06/30/2017 Nestor Lewandowsky, OTR/L Pager: [phone]

## 2017-06-30 NOTE — Progress Notes (Signed)
Received report from [facility] in the ED.

## 2017-06-30 NOTE — ED Notes (Signed)
Nurse will draw labs.

## 2017-06-30 NOTE — Progress Notes (Signed)
Family Medicine Teaching Service Daily Progress Note Intern Pager: [phone]  Patient name: Holly Mueller Medical record number: [id number] Date of birth: Feb 23, 1966 Age: 52 y.o. Gender: female  Primary Care Provider: [name], MD Consultants: diabetes coordinator Code Status: DNI  Pt Overview and Major Events to Date:  Admitted to [facility] on 04/28/2018  Assessment and Plan: Holly Mueller is a 52 y.o. female presenting with leg pain x 2 days. On admission pulmonary edema was found on CXR. PMH is significant for chronic leukopenia and thromobcytopenia, diabetes, hx of asthma, hx of seizures, depression, hypothyroidism, OA, GERD.  Pulmonary edema/pleural effusion Patient without complaints of SOB but does have productive sputum production. Febrile to 101.79F in ED. CXR on admission showing diffuse increased interstitial opacities suggesting acute mild interstitial inflammatory process or edema. Unclear etiology at this point, possible pneumonia given fever and CXR findings however no focal findings on lung exam. Unclear etiology at this point, can consider sampling pleural effusion to determine source for further evaluation. No leukocytosis but per chart review, she is chronically leukopenic and is followed by heme/onc for this. Lactic acid wnl. BNP wnl at 19.3.    -CTA chest with contrast  -can consider pulmonology consult pending CTA results  -blood cultures pending  -supplemental O2 as needed to keep O2 satus >94%, currently satting at 97% on 2L O2 -tessalon pearls as needed  -continue home asthma medications as listed below  -daily weights -intake and output  -PT/OT eval and treat   Fever Patient reports fever at home with Tmax of 102. Febrile in ED to 101.79F. Unclear etiology at the moment. Possibly 2/2 cellulitis given calf pain with warmth, but no erythema or edema noted.  -Bcx  -UA pending  -Ucx added on  -monitor fever curve   Cardiomegaly Borderline to mild  cardiomegaly seen on CXR. Unclear etiology. Possible post viral cardiomyopathy given recent hospitalization for flu. Cannot rule out drug related cardiomyopathy, however UDS negative. EKG in ED showing sinus tachycardia.  Troponin neg x 2 -echocardiogram  -consider cardiology consultation  -daily weights -Is & Os   Left leg pain  Unclear etiology. Have ruled out DVT with doppler studies in ED. Patient denies trauma or injury to area. Possible cellulitis but no erythema noted on exam. Tender to palpation but no cords palpated. Slightly warm in comparison to right  -will continue to monitor  -tylenol as needed for pain -if continues to be febrile can consider treating possible cellulitis  -if pain continues, could consider left leg MRI  -will start Keflex 561m q6hrs  -margins marked  -can consider MRI if no improvement   Leukopenia/thrombocytopenia - chronic  WBC on admission of 2.4 and platelets of 90. Appears similar to previous lab values, most recently on 06/19/17 with WBC of 2.6 and platelets of 49. Per previous notes appears to have been gradually progressing since 2014. Last heme/onc visit on 12/2016. Previous bone marrow biopsy did not reveal evidence of leukemia or myelodysplasia. The most recent Heme/onc note suggests likely etiology is liver cirrhosis and recommended abdominal [place] 2[place] showing hepatocellular disease/suspected cirrhosis and bidirectional flow in portal vein suggesting portal hypertension.  -can consider GI consultation for liver cirrhosis  -can consider antibiotic coverage if no clinical improvement   T2DM Home medications: metformin 5084mbid  -will hold metformin while hospitalized  -CBG 4 times daily  -sensitive sliding scale insulin  -diabetes education consultation   Asthma  Home meds: flonase daily, Dulera -continue home meds  -albuterol  prn   Seizures Last seizure in 1970s on chart review and previously treated with phenobarbital. Does not  require chronic prophylaxis. -monitor  Depression  Previously treated with Elavil but hasn't taken since 2013.  -Monitor  Hypothyroidism  Last TSH 0.081 in 11/2016. Takes Synthroid 255mg at home. -continue home synthroid -TSH  OA Does not use CPAP or BIPAP at home overnight.  -will continue to monitor -can consider CPAP overnight if desaturating overnight   GERD Denies active symptoms. Possible eosinophilic esophagitis on EGD in 2015, dilated lower esophageal ring in lower 1/3 of esophagus. Home meds: Protonix 46mdaily  -continue home meds  FEN/GI: heart healthy/carb modified  Prophylaxis: lovenox   Disposition: continued inpatient stay for monitoring   Subjective:  Patient doing well. States her leg is still causing pain. Has headache, but just got medications for it. Reports no dyspnea. Reports cough with sputum production   Objective: Temp:  [98.6 F (37 C)-101.2 F (38.4 C)] 98.6 F (37 C) (02/20 1020) Pulse Rate:  [81-118] 83 (02/20 1020) Resp:  [16-19] 19 (02/20 1020) BP: (101-132)/(65-85) 101/65 (02/20 1020) SpO2:  [94 %-98 %] 97 % (02/20 1030) Weight:  [237 lb (107.5 kg)] 237 lb (107.5 kg) (02/20 0345) Physical Exam: General: awake and alert, laying in bed, [place] in place, eating breakfast, NAD  Cardiovascular: RRR, no MRG Respiratory: slight wheezing in RUL  Abdomen: soft, non tender, non distended, bowel sounds normal  Extremities: no edema, tenderness in left calf  Skin: increased erythema around left calf, margins marked today  Laboratory: Recent Labs  Lab 06/29/17 1842 06/30/17 0819  WBC 2.4* 3.0*  HGB 12.8 11.7*  HCT 37.4 33.9*  PLT 90* 86*   Recent Labs  Lab 06/29/17 1842 06/30/17 0819  NA 135 133*  K 3.9 3.5  CL 108 105  CO2 17* 20*  BUN 9 10  CREATININE 0.62 0.82  CALCIUM 9.1 8.7*  GLUCOSE 136* 127*   Results for SUVERNETTE, MOISEMRN [id number] of 06/30/2017 11:22  Ref. Range 06/30/2017 02:39  Appearance Latest Ref Range:  CLEAR  CLEAR  Bilirubin Urine Latest Ref Range: NEGATIVE  NEGATIVE  Color, Urine Latest Ref Range: YELLOW  AMBER (A)  Glucose Latest Ref Range: NEGATIVE mg/dL NEGATIVE  Hgb urine dipstick Latest Ref Range: NEGATIVE  MODERATE (A)  Ketones, ur Latest Ref Range: NEGATIVE mg/dL NEGATIVE  Leukocytes, UA Latest Ref Range: NEGATIVE  NEGATIVE  Nitrite Latest Ref Range: NEGATIVE  NEGATIVE  pH Latest Ref Range: 5.0 - 8.0  6.0  Protein Latest Ref Range: NEGATIVE mg/dL NEGATIVE  Specific Gravity, Urine Latest Ref Range: 1.005 - 1.030  1.017    Ref. Range 06/30/2017 02:39  Amphetamines Latest Ref Range: NONE DETECTED  NONE DETECTED  Barbiturates Latest Ref Range: NONE DETECTED  NONE DETECTED  Benzodiazepines Latest Ref Range: NONE DETECTED  NONE DETECTED  Opiates Latest Ref Range: NONE DETECTED  NONE DETECTED  COCAINE Latest Ref Range: NONE DETECTED  NONE DETECTED  Tetrahydrocannabinol Latest Ref Range: NONE DETECTED  NONE DETECTED    Imaging/Diagnostic Tests: CXR: 1. Low lung volumes2. Mild diffuse increased interstitial opacities suggesting acute mild interstitial inflammatory process or edema on underlying chronic change 3. Borderline to mild cardiomegaly Electronically Signed By: KiDonavan Foil.D. On: 06/29/2017 20:52  Vas [place] Ext: Final Interpretation: Right: No evidence of common femoral vein obstruction. Left: There is no evidence of deep vein thrombosis in the lower extremity.There is no evidence of superficial venous thrombosis. No cystic structure  found in the popliteal fossa. Electronically signed by Deitra Mayo on 06/30/2017 at 6:16:02 AM.  Caroline More, DO 06/30/2017, 11:28 AM PGY-1, [place] Intern pager: [phone], text pages welcome

## 2017-06-30 NOTE — H&P (Signed)
[facility] Admission History and Physical Service Pager: [phone]  Patient name: Holly Mueller Medical record number: [id number] Date of birth: 04/24/1966 Age: 52 y.o. Gender: female  Primary Care Provider: [name], MD Consultants: None Code Status: DNI (confirmed on admission)  Chief Complaint: Leg pain x2 days   Assessment and Plan: Holly Mueller is a 52 y.o. female presenting with leg pain x 2 days. On admission pulmonary edema was found on CXR. PMH is significant for chronic leukopenia and thromobcytopenia, diabetes, hx of asthma, hx of seizures, depression, hypothyroidism, OA, GERD.  Pulmonary edema/pleural effusion Patient without complaints of SOB but does have productive sputum production. Febrile to 101.66F in ED. CXR on admission showing diffuse increased interstitial opacities suggesting acute mild interstitial inflammatory process or edema. Unclear etiology at this point, possible pneumonia given fever and CXR findings however no focal findings on lung exam. Unclear etiology at this point, can consider sampling pleural effusion to determine source for further evaluation. No leukocytosis but per chart review, she is chronically leukopenic and is followed by heme/onc for this. Lactic acid wnl. BNP wnl at 19.3.   -admit to telemetry, attending Dr. Nori Riis  -CTA chest with contrast  -continuous cardiac monitoring  -vital signs per routine  -am CBC -can consider pulmonology consult in am  -blood cultures pending  -supplemental O2 as needed to keep O2 satus >94% -tessalon pearls as needed  -continue home asthma medications as listed below  -daily weights -intake and output  -PT/OT eval and treat   Fever Patient reports fever at home with Tmax of 102. Febrile in ED to 101.66F. Unclear etiology at the moment. Possibly 2/2 cellulitis given calf pain with warmth, but no erythema or edema noted.  -Bcx  -UA pending  -Ucx added on  -monitor  fever curve   Cardiomegaly Borderline to mild cardiomegaly seen on CXR. Unclear etiology. Possible post viral cardiomyopathy given recent hospitalization for flu. Cannot rule out drug related cardiomyopathy. EKG in ED showing sinus tachycardia.  -echocardiogram  -consider cardiology consultation  -UDS pending  -daily weights -Is & Os  -will trend troponins   Left leg pain  Unclear etiology. Have ruled out DVT with doppler studies in ED. Patient denies trauma or injury to area. Possible cellulitis but no erythema noted on exam. Tender to palpation but no cords palpated. Slightly warm in comparison to right  -will continue to monitor  -tylenol as needed for pain -if continues to be febrile can consider treating possible cellulitis  -if pain continues, could consider left leg MRI   Leukopenia/thrombocytopenia - chronic  WBC on admission of 2.4 and platelets of 90. Appears similar to previous lab values, most recently on 06/19/17 with WBC of 2.6 and platelets of 49. Per previous notes appears to have been gradually progressing since 2014. Last heme/onc visit on 12/2016. Previous bone marrow biopsy did not reveal evidence of leukemia or myelodysplasia. The most recent Heme/onc note suggests likely etiology is liver cirrhosis and recommended abdominal US. [place] on 2/05 showing hepatocellular disease/suspected cirrhosis and bidirectional flow in portal vein suggesting portal hypertension.  -can consider GI consultation in am for liver cirrhosis  -can consider antibiotic coverage if no clinical improvement   T2DM Home medications: metformin 564m bid  -will hold metformin while hospitalized  -CBG 4 times daily  -sensitive sliding scale insulin   Asthma  Home meds: flonase daily, Dulera -continue home meds  -albuterol prn   Seizures Last seizure in 1970s on chart review  and previously treated with phenobarbital. Does not require chronic prophylaxis. -monitor  Depression  Previously treated  with Elavil but hasn't taken since 2013.  -Monitor  Hypothyroidism  Last TSH 0.081 in 11/2016. Takes Synthroid 264mg at home. -continue home synthroid -TSH  OA Does not use CPAP or BIPAP at home overnight.  -will continue to monitor -can consider CPAP overnight if desaturating overnight   GERD Denies active symptoms. Possible eosinophilic esophagitis on EGD in 2015, dilated lower esophageal ring in lower 1/3 of esophagus. Home meds: Protonix 420mdaily  -continue home meds  History of alcohol use Pt with h/o cirrhosis likely 2/2 alcohol use.  She denies use on exam.   -CIWA protocol    FEN/GI: heart healthy/carb modified  Prophylaxis: lovenox   Disposition: admit to telemetry, attending Dr. NeNori Riis History of Present Illness:  Holly DAGLEYs a 89.o. female presenting with leg pain and found to have pulmonary edema and pleural effusion on CXR. Patient reports reasoning for coming to ED was worsening leg pain x 2 days. Patient reports leg cramping in left calf that has been progressively worsening. Patient states that even a piece of hair touching her calf will cause severe pain. Reports using home remedies such as apple cider vinegar as well as tylenol and ibuprofen but relief was achieved. Patient states pain is equally painful with both walking and at rest. Left lower extremity venous duplex completed in ED and negative for DVT.   During ED evaluation CXR was obtained showing diffuse interstitial opacities suggesting acute mild interstitial inflammatory process or edema. Patient denies difficulty breathing but does endorse productive cough. Denies hemoptysis. Patient endorses 9lb weight loss since last hospitalization. Patient endorses Fever with Tmax of 102F at home.   Denies nausea, vomiting.  Endorses few diarrhea episodes yesterday.  No abdominal or chest pain. Reports sometimes feeling palpitations.    Left leg pain started 2 days ago, persisted despite tylenol last few  days, took temp 99.8, then 102.36F.     Review Of Systems: Per HPI with the following additions:   Review of Systems  Constitutional: Positive for fever and weight loss.  Respiratory: Positive for cough, sputum production and shortness of breath. Negative for hemoptysis.   Cardiovascular: Positive for palpitations and orthopnea. Negative for chest pain, claudication, leg swelling and PND.  Gastrointestinal: Positive for diarrhea. Negative for nausea and vomiting.  Genitourinary: Negative for dysuria and hematuria.    Patient Active Problem List   Diagnosis Date Noted  . Pulmonary edema 06/30/2017  . Left leg pain   . Bibasilar crackles   . Influenza-like illness   . Influenza A   . Neutropenic fever ([facility]/08/2017  . Cirrhosis of liver without ascites ([facility]/02/2017  . Thrombocytopenia ([facility]/02/2017  . Chronic leukopenia 11/17/2016  . Bicytopenia 10/24/2016  . Epistaxis 10/23/2016  . GERD (gastroesophageal reflux disease) 10/23/2016  . Cough 12/09/2015  . Chronic pain of left thumb 10/09/2015  . Precordial chest pain 08/03/2015  . Abscess of skin of abdomen 07/10/2014  . Bursitis of left hip 09/14/2013  . Lower esophageal ring 08/18/2013  . Dyspnea 08/17/2013  . Ganglion cyst of wrist 08/24/2012  . Hip pain, bilateral 08/23/2012  . Umbilical hernia 1117/51/0258. Healthcare maintenance 02/14/2012  . Osteoarthritis 02/12/2012  . Elevated LFTs 10/30/2011  . Allergic rhinitis 08/26/2011  . Obesity 07/08/2010  . SEIZURES, HX OF 12/12/2009  . DENTAL CARIES 12/18/2008  . DEPRESSION 07/26/2008  . MIGRAINE HEADACHE 11/15/2007  .  Hypothyroidism 10/12/2006  . Asthma 07/30/2006    Past Medical History: Past Medical History:  Diagnosis Date  . Arthritis    bursitis left hip flares-not an issue  . Asthma 2004  . Edentulous    10-19-13 at present  . Epilepsy ([place]) in 1972   No seizures since 1972. Previously treated with phenobarbital.   . Fever 06/2017  . GERD  (gastroesophageal reflux disease) 2008  . Headache(784.0)    hx of migraines   . Hypothyroidism 2008  . Lower esophageal ring 08/18/2013  . Seasonal allergies 2003  . Shortness of breath     Past Surgical History: Past Surgical History:  Procedure Laterality Date  . BALLOON DILATION N/A 10/24/2013   Procedure: BALLOON DILATION;  Surgeon: Gatha Mayer, MD;  Location: [facility] ENDOSCOPY;  Service: Endoscopy;  Laterality: N/A;  . CESAREAN SECTION     x2  . DENTAL SURGERY     multiple extractions 3'15  . ESOPHAGOGASTRODUODENOSCOPY N/A 10/24/2013   Procedure: ESOPHAGOGASTRODUODENOSCOPY (EGD);  Surgeon: Gatha Mayer, MD;  Location: [facility];  Service: Endoscopy;  Laterality: N/A;  . LIVER BIOPSY  06/30/2012   Procedure: LIVER BIOPSY;  Surgeon: Shann Medal, MD;  Location: [facility] ORS;  Service: General;;  . NOVASURE ABLATION    . SHOULDER ARTHROSCOPY Right 2011  . UMBILICAL HERNIA REPAIR N/A 06/30/2012   Procedure: remove umbilicus;  Surgeon: Shann Medal, MD;  Location: [facility] ORS;  Service: General;  Laterality: N/A;  . VENTRAL HERNIA REPAIR N/A 06/30/2012   Procedure: LAPAROSCOPIC VENTRAL HERNIA;  Surgeon: Shann Medal, MD;  Location: [facility];  Service: General;  Laterality: N/A;  With Mesh  . WISDOM TOOTH EXTRACTION      Social History: Social History   Tobacco Use  . Smoking status: Former Smoker    Packs/day: 0.25    Types: Cigarettes    Last attempt to quit: 1997    Years since quitting: 22.1  . Smokeless tobacco: Never Used  Substance Use Topics  . Alcohol use: Yes    Alcohol/week: 0.0 oz    Comment: occ  . Drug use: No   Additional social history: Lives at home with roommate and 2 sons.  Denies tobacco use, drug use and alcohol use.   Please also refer to relevant sections of EMR.  Family History: Family History  Problem Relation Age of Onset  . Hypertension Mother   . Diabetes Mother   . Lung cancer Father   . Stroke Father   . Diabetes Sister   . Hypertension  Sister     Allergies and Medications: Allergies  Allergen Reactions  . Aspirin Nausea Only    Upset stomach   No current facility-administered medications on file prior to encounter.    Current Outpatient Medications on File Prior to Encounter  Medication Sig Dispense Refill  . albuterol (PROAIR HFA) 108 (90 Base) MCG/ACT inhaler INHALE 2 PUFFS INTO THE LUNGS EVERY 6 HOURS AS NEEDED FOR SHORTNESS OF BREATH 8.5 g 5  . albuterol (PROVENTIL) (2.5 MG/3ML) 0.083% nebulizer solution Take 3 mLs (2.5 mg total) by nebulization every 6 (six) hours as needed for wheezing or shortness of breath. 75 mL 2  . benzonatate (TESSALON) 100 MG capsule Take 1 capsule (100 mg total) by mouth 3 (three) times daily as needed for cough. 40 capsule 0  . blood glucose meter kit and supplies KIT Dispense based on patient and insurance preference. Use up to four times daily as directed. (FOR ICD-9 250.00,  250.01). 1 each 0  . budesonide-formoterol (SYMBICORT) 160-4.5 MCG/ACT inhaler Inhale 2 puffs into the lungs 2 (two) times daily. 1 Inhaler 12  . cetirizine (ZYRTEC) 10 MG tablet Take 1 tablet (10 mg total) by mouth daily. 30 tablet 11  . fluticasone (FLONASE) 50 MCG/ACT nasal spray Place 2 sprays into both nostrils daily.    . hydrocortisone 2.5 % ointment Apply topically 2 (two) times daily. 30 g 0  . levothyroxine (SYNTHROID, LEVOTHROID) 125 MCG tablet Take 2 tablets (250 mcg total) by mouth daily. 60 tablet 1  . metFORMIN (GLUCOPHAGE) 500 MG tablet Take 1 tablet (500 mg total) by mouth 2 (two) times daily with a meal. 180 tablet 0  . pantoprazole (PROTONIX) 40 MG tablet Take 1 tablet (40 mg total) by mouth daily. 30 tablet 3  . promethazine-dextromethorphan (PROMETHAZINE-DM) 6.25-15 MG/5ML syrup Take 5 mLs by mouth 4 (four) times daily as needed for cough. 120 mL 0    Objective: BP 112/66 (BP Location: Right Arm)   Pulse 81   Temp 98.6 F (37 C) (Oral)   Resp 19   Ht _0  (1.676 m)   Wt 237 lb (107.5 kg)    SpO2 97%   BMI 38.25 kg/m  Exam: General: awake and alert, laying in bed, mask on Eyes: PERRL ENTM: moist mucous membranes, oropharynx with no erythema  Cardiovascular: RRR, no MRG Respiratory: crackles at bases bilaterally, no increased work of breathing, no wheezes  Gastrointestinal: soft, non tender, non distended, bowel sounds normal  MSK: non edema, no cords palpated, calf tenderness in left calf to minimal palpation  Derm: no rashes, skin intact, left calf with increased warmth  Neuro: no focal deficits, able to move all four extremities   Labs and Imaging: CBC BMET  Recent Labs  Lab 06/29/17 1842  WBC 2.4*  HGB 12.8  HCT 37.4  PLT 90*   Recent Labs  Lab 06/29/17 1842  NA 135  K 3.9  CL 108  CO2 17*  BUN 9  CREATININE 0.62  GLUCOSE 136*  CALCIUM 9.1      Ref. Range 06/30/2017 01:36  Lactic Acid, Venous Latest Ref Range: 0.5 - 1.9 mmol/L 1.46    Ref. Range 06/30/2017 01:35  Troponin i, poc Latest Ref Range: 0.00 - 0.08 ng/mL 0.00      Caroline More, DO  PGY-1, [place] Intern pager: [phone], text pages welcome  I personally evaluated this patient along withDr. Tammi Klippel, and verified all aspects of the history, physical exam, and medical decision making as documentedabove. Ihave included my edits in blue.   Lovenia Kim, MD [facility] PGY-2

## 2017-06-30 NOTE — Progress Notes (Addendum)
Inpatient Diabetes Program Recommendations  AACE/ADA: New Consensus Statement on Inpatient Glycemic Control (2015)  Target Ranges:  Prepandial:   less than 140 mg/dL      Peak postprandial:   less than 180 mg/dL (1-2 hours)      Critically ill patients:  140 - 180 mg/dL   Lab Results  Component Value Date   GLUCAP 119 (H) 06/30/2017   HGBA1C 9.1 (H) 06/15/2017    Review of Glycemic ControlResults for ASHONTE, ANGELUCCI (MRN [id number]) as of 06/30/2017 11:52  Ref. Range 06/30/2017 03:54 06/30/2017 08:02  Glucose-Capillary Latest Ref Range: 65 - 99 mg/dL 155 (H) 119 (H)    Diabetes history: Type 2 DM Outpatient Diabetes medications: Metformin 500 mg bid Current orders for Inpatient glycemic control:  Novolog sensitive tid with meals  Inpatient Diabetes Program Recommendations:   Referral received regarding fairly new diagnosis of DM on last admit.  Patient was seen by diabetes coordinator, watched videos, and received DM booklet during previous admission. Will see patient this afternoon to reinforce teaching and see if patient has additional questions.    Thanks,  Adah Perl, RN, BC-ADM Inpatient Diabetes Coordinator Pager [phone] Addendum:  Spoke with patient briefly.  She states that she is taking her metformin and she has noted that her blood sugars have improved.  She has no needs or questions at this time.

## 2017-06-30 NOTE — ED Provider Notes (Signed)
[facility] EMERGENCY DEPARTMENT Provider Note   CSN: [id number] Arrival date & time: 06/29/17  [place]     History   Chief Complaint Chief Complaint  Patient presents with  . Leg Pain    HPI Holly Mueller is a 52 y.o. female.  The history is provided by the patient.  Leg Pain   This is a new problem. The current episode started more than 2 days ago. The problem occurs constantly. The problem has not changed since onset.The pain is present in the left lower leg. The quality of the pain is described as constant. The pain is at a severity of 10/10. The pain is severe. Pertinent negatives include no numbness, full range of motion, no stiffness, no tingling and no itching. She has tried OTC pain medications for the symptoms. The treatment provided no relief. There has been no history of extremity trauma. Family history is significant for no rheumatoid arthritis.  Also has fever, tachycardia, no trauma.  No weakness or numbness.    Past Medical History:  Diagnosis Date  . Arthritis    bursitis left hip flares-not an issue  . Asthma 2004  . Edentulous    10-19-13 at present  . Epilepsy ([name]) in 1972   No seizures since 1972. Previously treated with phenobarbital.   . Fever 06/2017  . GERD (gastroesophageal reflux disease) 2008  . Headache(784.0)    hx of migraines   . Hypothyroidism 2008  . Lower esophageal ring 08/18/2013  . Seasonal allergies 2003  . Shortness of breath     Patient Active Problem List   Diagnosis Date Noted  . Bibasilar crackles   . Influenza-like illness   . Influenza A   . Neutropenic fever ([place]) 06/14/2017  . Cirrhosis of liver without ascites ([place]) 11/17/2016  . Thrombocytopenia ([place]) 11/17/2016  . Chronic leukopenia 11/17/2016  . Bicytopenia 10/24/2016  . Epistaxis 10/23/2016  . GERD (gastroesophageal reflux disease) 10/23/2016  . Cough 12/09/2015  . Chronic pain of left thumb 10/09/2015  . Precordial chest pain 08/03/2015  .  Abscess of skin of abdomen 07/10/2014  . Bursitis of left hip 09/14/2013  . Lower esophageal ring 08/18/2013  . Dyspnea 08/17/2013  . Ganglion cyst of wrist 08/24/2012  . Hip pain, bilateral 08/23/2012  . Umbilical hernia 23/30/0762  . Healthcare maintenance 02/14/2012  . Osteoarthritis 02/12/2012  . Elevated LFTs 10/30/2011  . Allergic rhinitis 08/26/2011  . Obesity 07/08/2010  . SEIZURES, HX OF 12/12/2009  . DENTAL CARIES 12/18/2008  . DEPRESSION 07/26/2008  . MIGRAINE HEADACHE 11/15/2007  . Hypothyroidism 10/12/2006  . Asthma 07/30/2006    Past Surgical History:  Procedure Laterality Date  . BALLOON DILATION N/A 10/24/2013   Procedure: BALLOON DILATION;  Surgeon: Gatha Mayer, MD;  Location: [facility] ENDOSCOPY;  Service: Endoscopy;  Laterality: N/A;  . CESAREAN SECTION     x2  . DENTAL SURGERY     multiple extractions 3'15  . ESOPHAGOGASTRODUODENOSCOPY N/A 10/24/2013   Procedure: ESOPHAGOGASTRODUODENOSCOPY (EGD);  Surgeon: Gatha Mayer, MD;  Location: [facility];  Service: Endoscopy;  Laterality: N/A;  . LIVER BIOPSY  06/30/2012   Procedure: LIVER BIOPSY;  Surgeon: Shann Medal, MD;  Location: [facility] ORS;  Service: General;;  . NOVASURE ABLATION    . SHOULDER ARTHROSCOPY Right 2011  . UMBILICAL HERNIA REPAIR N/A 06/30/2012   Procedure: remove umbilicus;  Surgeon: Shann Medal, MD;  Location: [facility] ORS;  Service: General;  Laterality: N/A;  . [place]  N/A 06/30/2012   Procedure: LAPAROSCOPIC VENTRAL HERNIA;  Surgeon: Shann Medal, MD;  Location: [facility] ORS;  Service: General;  Laterality: N/A;  With Mesh  . WISDOM TOOTH EXTRACTION      OB History    No data available       Home Medications    Prior to Admission medications   Medication Sig Start Date End Date Taking? Authorizing Provider  albuterol (PROAIR HFA) 108 (90 Base) MCG/ACT inhaler INHALE 2 PUFFS INTO THE LUNGS EVERY 6 HOURS AS NEEDED FOR SHORTNESS OF BREATH 10/23/16   Mercy Riding, MD  albuterol  (PROVENTIL) (2.5 MG/3ML) 0.083% nebulizer solution Take 3 mLs (2.5 mg total) by nebulization every 6 (six) hours as needed for wheezing or shortness of breath. 10/23/16   Mercy Riding, MD  benzonatate (TESSALON) 100 MG capsule Take 1 capsule (100 mg total) by mouth 3 (three) times daily as needed for cough. 06/19/17   Rory Percy, DO  blood glucose meter kit and supplies KIT Dispense based on patient and insurance preference. Use up to four times daily as directed. (FOR ICD-9 250.00, 250.01). 06/19/17   Rory Percy, DO  budesonide-formoterol (SYMBICORT) 160-4.5 MCG/ACT inhaler Inhale 2 puffs into the lungs 2 (two) times daily. 04/05/17   Tanna Furry, MD  cetirizine (ZYRTEC) 10 MG tablet Take 1 tablet (10 mg total) by mouth daily. 01/27/17   [name], MD  fluticasone (FLONASE) 50 MCG/ACT nasal spray Place 2 sprays into both nostrils daily.    [provider]  hydrocortisone 2.5 % ointment Apply topically 2 (two) times daily. 06/24/17   Nicolette Bang, DO  levothyroxine (SYNTHROID, LEVOTHROID) 125 MCG tablet Take 2 tablets (250 mcg total) by mouth daily. 03/11/17   [name], MD  metFORMIN (GLUCOPHAGE) 500 MG tablet Take 1 tablet (500 mg total) by mouth 2 (two) times daily with a meal. 06/24/17   Nicolette Bang, DO  pantoprazole (PROTONIX) 40 MG tablet Take 1 tablet (40 mg total) by mouth daily. 03/11/17   [name], MD  promethazine-dextromethorphan (PROMETHAZINE-DM) 6.25-15 MG/5ML syrup Take 5 mLs by mouth 4 (four) times daily as needed for cough. 06/19/17   Rory Percy, DO    Family History Family History  Problem Relation Age of Onset  . Hypertension Mother   . Diabetes Mother   . Lung cancer Father   . Stroke Father   . Diabetes Sister   . Hypertension Sister     Social History Social History   Tobacco Use  . Smoking status: Former Smoker    Packs/day: 0.25    Types: Cigarettes    Last attempt to quit: 1997    Years  since quitting: 22.1  . Smokeless tobacco: Never Used  Substance Use Topics  . Alcohol use: Yes    Alcohol/week: 0.0 oz    Comment: occ  . Drug use: No     Allergies   Aspirin   Review of Systems Review of Systems  Constitutional: Positive for fever. Negative for fatigue.  Musculoskeletal: Positive for arthralgias. Negative for stiffness.  Skin: Negative for color change, itching, pallor and rash.  Neurological: Negative for tingling and numbness.  All other systems reviewed and are negative.    Physical Exam Updated Vital Signs BP 118/84 (BP Location: Left Arm)   Pulse (!) 105   Temp (S) 100.2 F (37.9 C) (Oral)   Resp 18   SpO2 95%   Physical Exam  Constitutional: She is oriented to person, place, and  time. She appears well-developed and well-nourished.  HENT:  Head: Normocephalic and atraumatic.  Mouth/Throat: No oropharyngeal exudate.  Eyes: Conjunctivae are normal. Pupils are equal, round, and reactive to light.  Neck: Normal range of motion. Neck supple.  Cardiovascular: Regular rhythm and intact distal pulses. Tachycardia present.  Pulmonary/Chest: She has rales. She exhibits no tenderness.  Abdominal: Soft. Bowel sounds are normal. She exhibits no mass. There is no tenderness. There is no rebound and no guarding.  Musculoskeletal: Normal range of motion. She exhibits no edema.       Left hip: Normal.       Left knee: Normal.       Left ankle: Normal. Achilles tendon normal.       Left upper leg: Normal.       Left foot: Normal. There is normal range of motion, no tenderness, no bony tenderness, no swelling, normal capillary refill and no crepitus.  Neurological: She is alert and oriented to person, place, and time.  Skin: Skin is warm. Capillary refill takes less than 2 seconds. She is not diaphoretic.  Psychiatric: She has a normal mood and affect.     ED Treatments / Results  Labs (all labs ordered are listed, but only abnormal results are  displayed)  Results for orders placed or performed during the hospital encounter of 06/29/17  CBC with Differential  Result Value Ref Range   WBC 2.4 (L) 4.0 - 10.5 K/uL   RBC 4.34 3.87 - 5.11 MIL/uL   Hemoglobin 12.8 12.0 - 15.0 g/dL   HCT 37.4 36.0 - 46.0 %   MCV 86.2 78.0 - 100.0 fL   MCH 29.5 26.0 - 34.0 pg   MCHC 34.2 30.0 - 36.0 g/dL   RDW 14.6 11.5 - 15.5 %   Platelets 90 (L) 150 - 400 K/uL   Neutrophils Relative % 65 %   Neutro Abs 1.6 (L) 1.7 - 7.7 K/uL   Lymphocytes Relative 22 %   Lymphs Abs 0.5 (L) 0.7 - 4.0 K/uL   Monocytes Relative 11 %   Monocytes Absolute 0.3 0.1 - 1.0 K/uL   Eosinophils Relative 1 %   Eosinophils Absolute 0.0 0.0 - 0.7 K/uL   Basophils Relative 1 %   Basophils Absolute 0.0 0.0 - 0.1 K/uL  Basic metabolic panel  Result Value Ref Range   Sodium 135 135 - 145 mmol/L   Potassium 3.9 3.5 - 5.1 mmol/L   Chloride 108 101 - 111 mmol/L   CO2 17 (L) 22 - 32 mmol/L   Glucose, Bld 136 (H) 65 - 99 mg/dL   BUN 9 6 - 20 mg/dL   Creatinine, Ser 0.62 0.44 - 1.00 mg/dL   Calcium 9.1 8.9 - 10.3 mg/dL   GFR calc non Af Amer >60 >60 mL/min   GFR calc Af Amer >60 >60 mL/min   Anion gap 10 5 - 15  I-Stat CG4 Lactic Acid, ED  Result Value Ref Range   Lactic Acid, Venous 1.32 0.5 - 1.9 mmol/L   Dg Chest 2 View  Result Date: 06/29/2017 CLINICAL DATA:  Fever EXAM: CHEST  2 VIEW COMPARISON:  06/17/2017, 06/14/2017, 04/05/2017 FINDINGS: Low lung volumes. Diffuse increased interstitial opacity. Streaky atelectasis at the bases. No pleural effusion. Stable slightly enlarged cardiomediastinal silhouette. No pneumothorax IMPRESSION: 1. Low lung volumes 2. Mild diffuse increased interstitial opacities suggesting acute mild interstitial inflammatory process or edema on underlying chronic change 3. Borderline to mild cardiomegaly Electronically Signed   By: Madie Reno.D.  On: 06/29/2017 20:52   Dg Chest 2 View  Result Date: 06/17/2017 CLINICAL DATA:  cough EXAM:  CHEST  2 VIEW FINDINGS: Normal cardiac silhouette. Fine interstitial linear lung pattern. No change from prior. Low lung volumes. No consolidation. No pneumothorax or pleural fluid. IMPRESSION: 1. No significant change. 2. Fine interstitial pattern suggests edema. Electronically Signed   By: Suzy Bouchard M.D.   On: 06/17/2017 10:15   Dg Chest 2 View  Result Date: 06/14/2017 CLINICAL DATA:  Productive cough. EXAM: CHEST  2 VIEW COMPARISON:  Radiographs of April 05, 2017. FINDINGS: The heart size and mediastinal contours are within normal limits. Hypoinflation of the lungs is noted with mild bibasilar subsegmental atelectasis. No pneumothorax or pleural effusion is noted. The visualized skeletal structures are unremarkable. IMPRESSION: Hypoinflation of lungs with mild bibasilar subsegmental atelectasis. Electronically Signed   By: Marijo Conception, M.D.   On: 06/14/2017 17:05   US Abdomen Complete  Result Date: 06/15/2017 CLINICAL DATA:  Neutropenic fever EXAM: ABDOMEN ULTRASOUND COMPLETE COMPARISON:  CT 05/26/2012 FINDINGS: Gallbladder: No gallstones or wall thickening visualized. No sonographic Murphy sign noted by sonographer. Common bile duct: Diameter: 3.4 mm Liver: Enlarged liver measuring up to 20.5 cm. Coarse heterogeneous echotexture with nodular contour. No focal hepatic abnormality. Portal vein is patent on color Doppler imaging. There is bidirectional flow. IVC: No abnormality visualized. Pancreas: Visualized portion unremarkable. Spleen: Enlarged measuring up to 20.5 cm. Right Kidney: Length: 14.6 cm. Echogenicity within normal limits. No mass or hydronephrosis visualized. Left Kidney: Length: 14.7 cm. Echogenicity within normal limits. No mass or hydronephrosis visualized. Abdominal aorta: No aneurysm visualized. Unable to visualize distal segment due to gas. Maximum AP diameter of 2.6 cm. Other findings: None. IMPRESSION: 1. Negative for gallstones or biliary dilatation 2. Enlarged coarse  heterogeneous nodular liver, consistent with hepatocellular disease/suspected cirrhosis. Bidirectional flow in the portal vein suggesting portal hypertension 3. Splenomegaly 4. Maximum AP diameter of abdominal aorta measuring 2.6 cm. Ectatic abdominal aorta at risk for aneurysm development. Recommend followup by ultrasound in 5 years. This recommendation follows ACR consensus guidelines: White Paper of the ACR Incidental Findings Committee II on Vascular Findings. J Am Coll Radiol 2013; 10:789-794. Electronically Signed   By: Donavan Foil M.D.   On: 06/15/2017 03:20    EKG  EKG Interpretation  Date/Time:  Wednesday June 30 2017 01:07:54 EST Ventricular Rate:  101 PR Interval:  148 QRS Duration: 84 QT Interval:  336 QTC Calculation: 435 R Axis:   2 Text Interpretation:  Sinus tachycardia Confirmed by Dory Horn) on 06/30/2017 1:16:00 AM       Radiology Dg Chest 2 View  Result Date: 06/29/2017 CLINICAL DATA:  Fever EXAM: CHEST  2 VIEW COMPARISON:  06/17/2017, 06/14/2017, 04/05/2017 FINDINGS: Low lung volumes. Diffuse increased interstitial opacity. Streaky atelectasis at the bases. No pleural effusion. Stable slightly enlarged cardiomediastinal silhouette. No pneumothorax IMPRESSION: 1. Low lung volumes 2. Mild diffuse increased interstitial opacities suggesting acute mild interstitial inflammatory process or edema on underlying chronic change 3. Borderline to mild cardiomegaly Electronically Signed   By: Donavan Foil M.D.   On: 06/29/2017 20:52    Procedures Procedures (including critical care time)  Medications Ordered in ED Medications  vancomycin (VANCOCIN) IVPB 1000 mg/200 mL premix (not administered)  piperacillin-tazobactam (ZOSYN) IVPB 3.375 g (not administered)  acetaminophen (TYLENOL) tablet 650 mg (650 mg Oral Given 06/29/17 1846)     No IVF given as patient is already fluid overloaded.     Final Clinical  Impressions(s) / ED Diagnoses   Will admit for  volume overload and ongoing fever, patient likely has pulmonary edema and HCAP.  Admitted to [facility], Marieanne Marxen, MD 06/30/17 8546

## 2017-07-01 DIAGNOSIS — E119 Type 2 diabetes mellitus without complications: Secondary | ICD-10-CM

## 2017-07-01 DIAGNOSIS — L03116 Cellulitis of left lower limb: Principal | ICD-10-CM

## 2017-07-01 DIAGNOSIS — D709 Neutropenia, unspecified: Secondary | ICD-10-CM

## 2017-07-01 LAB — CBC
HEMATOCRIT: 33.3 % — AB (ref 36.0–46.0)
Hemoglobin: 11.3 g/dL — ABNORMAL LOW (ref 12.0–15.0)
MCH: 29.3 pg (ref 26.0–34.0)
MCHC: 33.9 g/dL (ref 30.0–36.0)
MCV: 86.3 fL (ref 78.0–100.0)
Platelets: 87 10*3/uL — ABNORMAL LOW (ref 150–400)
RBC: 3.86 MIL/uL — ABNORMAL LOW (ref 3.87–5.11)
RDW: 14.9 % (ref 11.5–15.5)
WBC: 2.6 10*3/uL — ABNORMAL LOW (ref 4.0–10.5)

## 2017-07-01 LAB — BASIC METABOLIC PANEL
ANION GAP: 8 (ref 5–15)
BUN: 10 mg/dL (ref 6–20)
CALCIUM: 8.6 mg/dL — AB (ref 8.9–10.3)
CO2: 20 mmol/L — AB (ref 22–32)
Chloride: 104 mmol/L (ref 101–111)
Creatinine, Ser: 0.75 mg/dL (ref 0.44–1.00)
GFR calc Af Amer: >60 mL/min (ref >60)
GFR calc non Af Amer: >60 mL/min (ref >60)
GLUCOSE: 168 mg/dL — AB (ref 65–99)
POTASSIUM: 3.4 mmol/L — AB (ref 3.5–5.1)
Sodium: 132 mmol/L — ABNORMAL LOW (ref 135–145)

## 2017-07-01 LAB — GLUCOSE, CAPILLARY
GLUCOSE-CAPILLARY: 109 mg/dL — AB (ref 65–99)
GLUCOSE-CAPILLARY: 107 mg/dL — AB (ref 65–99)
Glucose-Capillary: 141 mg/dL — ABNORMAL HIGH (ref 65–99)
Glucose-Capillary: 135 mg/dL — ABNORMAL HIGH (ref 65–99)

## 2017-07-01 MED ORDER — POTASSIUM CHLORIDE CRYS ER 20 MEQ PO TBCR
40.0000 meq | EXTENDED_RELEASE_TABLET | Freq: Once | ORAL | Status: AC
  Administered 2017-07-01: 40 meq via ORAL
  Filled 2017-07-01: qty 2

## 2017-07-01 MED ORDER — TRAMADOL HCL 50 MG PO TABS
50.0000 mg | ORAL_TABLET | Freq: Two times a day (BID) | ORAL | Status: DC | PRN
  Administered 2017-07-01 – 2017-07-04 (×3): 50 mg via ORAL
  Filled 2017-07-01 (×3): qty 1

## 2017-07-01 NOTE — Evaluation (Signed)
Occupational Therapy Evaluation Patient Details Name: Holly Mueller MRN: [id number] DOB: 1966-02-23 Today's Date: 07/01/2017    History of Present Illness Holly Mueller is a 52 y.o. female presenting with leg pain x 2 days. On admission pulmonary edema was found on CXR. PMH is significant for chronic leukopenia and thromobcytopenia, diabetes, hx of asthma, hx of seizures, depression, hypothyroidism, OA, GERD.   Clinical Impression   Pt currently min assist level for LB selfcare, toileting, and toilet transfers during session with use of the RW.  Increased pain and redness is still present in the left lower leg region.  During transfers and functional mobility pt is only able to place her left toe down on the ground secondary to increased pain during weightbearing.  Feel she will benefit from acute care OT to increase balance, strength, and safety for ADLs and return home.  Will continue to follow as pt will have 24 hour supervision at discharge per her report.      Follow Up Recommendations  Home health OT    Equipment Recommendations  Other (comment)(TBD next visit)       Precautions / Restrictions Precautions Precautions: Fall Precaution Comments: L LE pain Restrictions Weight Bearing Restrictions: No      Mobility Bed Mobility Overal bed mobility: Modified Independent                Transfers Overall transfer level: Needs assistance Equipment used: Rolling walker (2 wheeled) Transfers: Sit to/from Stand Sit to Stand: Min assist              Balance Overall balance assessment: Needs assistance Sitting-balance support: No upper extremity supported Sitting balance-Leahy Scale: Normal     Standing balance support: During functional activity Standing balance-Leahy Scale: Poor Standing balance comment: Pt needed use of the RW for support in standing and during mobility.                             ADL either performed or assessed with clinical  judgement   ADL Overall ADL's : Needs assistance/impaired Eating/Feeding: Independent   Grooming: Supervision/safety;Standing   Upper Body Bathing: Supervision/ safety;Sitting   Lower Body Bathing: Minimal assistance;Sit to/from stand   Upper Body Dressing : Supervision/safety;Sitting   Lower Body Dressing: Minimal assistance;Sit to/from stand   Toilet Transfer: Minimal assistance;RW;Grab Environmental education officer and Hygiene: Minimal assistance;Sit to/from stand       Functional mobility during ADLs: Minimal assistance;Rolling walker General ADL Comments: Pt still with increased pain in the right calf and unable to tolerate more than toe touch weightbearing during mobility in the room with use of the RW.  She reports having a small shower seat from last hospital visit in early February but she would need a tub bench if discharged soon.  Discussed option of sponge bathing until she can step over the edge of the tub to use her current seat.       Vision Baseline Vision/History: No visual deficits Patient Visual Report: No change from baseline Vision Assessment?: No apparent visual deficits            Pertinent Vitals/Pain Pain Assessment: 0-10 Pain Score: 6  Pain Location: left calf up into the back of the knee Pain Descriptors / Indicators: Discomfort        Extremity/Trunk Assessment Upper Extremity Assessment Upper Extremity Assessment: Overall WFL for tasks assessed   Lower Extremity Assessment Lower Extremity Assessment: Defer  to PT evaluation   Cervical / Trunk Assessment Cervical / Trunk Assessment: Normal      Cognition Arousal/Alertness: Awake/alert Behavior During Therapy: WFL for tasks assessed/performed Overall Cognitive Status: Within Functional Limits for tasks assessed                                                Home Living Family/patient expects to be discharged to:: Private residence Living  Arrangements: Children;Non-relatives/Friends Available Help at Discharge: Family;Friend(s);Available 24 hours/day Type of Home: House Home Access: Stairs to enter [employer] of Steps: 2 Entrance Stairs-Rails: None Home Layout: One level     Bathroom Shower/Tub: Teacher, early years/pre: Handicapped height     Home Equipment: None   Additional Comments: roomate works during the day, kids are 53yo and 44 yo, not in school, don't work      Prior Functioning/Environment Level of Independence: Independent        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 [employer] Problem List: Decreased strength;Decreased activity tolerance;Impaired balance (sitting and/or standing);Pain      OT Treatment/Interventions: Self-care/ADL training;DME and/or AE instruction;Therapeutic activities;Balance training;Patient/family education    OT Goals(Current goals can be found in the care plan section) Acute Rehab OT Goals Patient Stated Goal: Get her left leg to quit hurting Time For Goal Achievement: 07/15/17 Potential to Achieve Goals: Good  OT Frequency: Min 2X/week              AM-PAC PT "6 Clicks" Daily Activity     Outcome Measure Help from another person eating meals?: None Help from another person taking care of personal grooming?: A Little Help from another person toileting, which includes using toliet, bedpan, or urinal?: A Little Help from another person bathing (including washing, rinsing, drying)?: A Little Help from another person to put on and taking off regular upper body clothing?: None Help from another person to put on and taking off regular lower body clothing?: A Little 6 Click Score: 20   End of Session Equipment Utilized During Treatment: Rolling walker;Oxygen Nurse Communication: Mobility status;Other (comment)(increased redness in the left calf)  Activity Tolerance: Patient limited by pain Patient left: in bed;with call bell/phone within  reach  OT Visit Diagnosis: Unsteadiness on feet (R26.81);Muscle weakness (generalized) (M62.81);Pain Pain - Right/Left: Left Pain - part of body: Leg                Time: 2840-6986 OT Time Calculation (min): 22 min Charges:  OT General Charges $OT Visit: 1 Visit OT Evaluation $OT Eval Moderate Complexity: 1 Mod   Gayla Benn OTR/L 07/01/2017, 9:44 AM

## 2017-07-01 NOTE — Progress Notes (Signed)
Family Medicine Teaching Service Daily Progress Note Intern Pager: [phone]  Patient name: Holly Mueller Medical record number: [id number] Date of birth: Apr 29, 1966 Age: 52 y.o. Gender: female  Primary Care Provider: [name], MD Consultants: diabetes coordinator Code Status: DNI  Pt Overview and Major Events to Date:  06/29/2017: Admitted 06/30/2017: started keflex for cellulitis  Assessment and Plan: Holly Brutus Sumneris a 52 y.o.femalepresenting with LLE pain with incidental pulmonary edema found on admission CXR. Medical history is significant forchronic leukopenia and thrombocytopenia, cirrhosis,diabetes,asthma, hx of seizures, depression, hypothyroidism, OA, GERD.  LLE Erysipelas vs Cellulitis: Overnight, patient complained of increasing leg pain, had extension of erythema (see interim progress note), and became febrile but otherwise remained HDS, so was broadened to IV CTX. On exam this morning, erythema moderately improved. Likely that cephalexin constituted appropriate coverage, however, will continue IV CTX at this time given patient has chronic leukopenia.  - IV CTX (2/22 - ), s/p cephalexin 570m Q6H (2/20 - 2/21) - f/u Bcx(NG x 1d) - f/u Ucx to rule out other fever source, although UA only notable for hematuria (see below) - monitor fever curve - tramadol 541mQ12H prn pain - acetaminophen prn  Pulmonary edema  Acute Hypoxia: CXR and CTA on admission w diffuse interstitial opacities (inflammation vs edema).Pt remains on room air (prev on 2L); unclear etiology but possible post-viral congestion vs bronchiectasis (pt complains of 3 mo productive cough) - tessalon pearls prn - continue home dulera BID, flonase - I/O, daily weights - PT/OT: HH OT and rolling walker - consider CPAP given OSA history  Pancytopenia: Chronic and stable since admission. WBC 2.0, platelets 98, Hgb 10.8 today. 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  - CTM; recommend outpatient GI follow-up for cirrhosis  T2DM: Last A1C 9.1 06/15/17. On metformin 5005mID at home - hold metformin while hospitalized  - sSSI, CBG 4x daily - diabetes education consulted  Cardiomegaly: Borderline to mild cardiomegaly on CXR, new since CXR 2/7. 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  - CTM  Asthma: chronic, stable - continue home flonase and dulera  Seizures: Last seizure in 1970s, previously treated with phenobarbital.  - CTM  Hypothyroidism: TSH 10.886 on admission - continue 250m64mevothyroxine (home dose 125mc40mOA: Does not use CPAP or BIPAP at home overnight.  - CTM, consider CPAP nightly to see if resp status improves  GERD: chronic, stable - continue home Protonix40mg 86my  FEN/GI:heart healthy/carb modified Prophylaxis:lovenox  Disposition: continued inpatient stay for monitoring   Subjective:  Patient reports good pain relief with tramadol. She has had some difficulty ambulating.   Objective: Temp:  [98.4 F (36.9 C)-100.6 F (38.1 C)] 98.4 F (36.9 C) (02/22 0257) Pulse Rate:  [82-96] 94 (02/22 0046) Resp:  [16-20] 20 (02/22 0046) BP: (108-116)/(72-78) 116/78 (02/21 2154) SpO2:  [92 %-98 %] 94 % (02/22 0906) Weight:  [236 lb 6.4 oz (107.2 kg)] 236 lb 6.4 oz (107.2 kg) (02/22 0500) Physical Exam: General: NAD, alert, oriented Cardiovascular: RRR, no m/r/g Respiratory: Fine bibasilar crackles present, normal WOB on RA Abdomen: Obese, unable to appreciate organomegaly but exam limited. Normoactive bowel sounds Extremities: WWP. LLE erythema has receded partially from marked borders yesterday evening. Small overlying petechiae increased in number and concentrated just distal to popliteal fossae. Very TTP over erythematous portion of leg but no palpable crepitus, fluctuance, or masses. 2+ pedal pulses. Overall, borders of erythema improved from  exam yesterday evening.  Laboratory: Recent Labs  Lab 06/30/17 0819 07/01/17 0337 07/02/17 0802  WBC 3.0* 2.6* 2.0*  HGB 11.7* 11.3* 10.8*  HCT 33.9* 33.3* 31.6*  PLT 86* 87* 98*   Recent Labs  Lab 06/30/17 0819 07/01/17 0337 07/02/17 0802  NA 133* 132* 134*  K 3.5 3.4* 3.7  CL 105 104 104  CO2 20* 20* 22  BUN _0 CREATININE 0.82 0.75 0.61  CALCIUM 8.7* 8.6* 8.5*  GLUCOSE 127* 168* 142*   Imaging/Diagnostic Tests: none  Dimple Casey, Medical Student 07/02/2017, 9:29 AM MS4, [place] Intern pager: [phone], text pages welcome  FPTS Upper-Level Resident Addendum  I have independently interviewed and examined the patient. I have discussed the above with the original author and agree with their documentation. I have edited the note as needed.   Hyman Bible, MD PGY-3, [place] Service pager: [phone] (text pages welcome through [place])

## 2017-07-01 NOTE — Progress Notes (Signed)
RN paged on call family medicine MD to make aware that patient c/o 10/10 pain in left lower leg and redness extends past mark that was placed prior to this shift.  Dr. Shawna Orleans returned call and RN explained situation and also asked for additional pain medication.  Dr. Shawna Orleans states she will come see patient. P.J. Linus Mako, RN

## 2017-07-01 NOTE — Discharge Summary (Signed)
[facility] Discharge Summary  Patient name: Holly Mueller Medical record number: [id number] Date of birth: 06/17/1965 Age: 52 y.o. Gender: female Date of Admission: 06/29/2017  Date of Discharge: 03/03/18 Admitting Physician: Holly La, MD  Primary Care Provider: [name], MD Consultants: none  Indication for Hospitalization: Acute Hypoxia, cellulitis  Discharge Diagnoses/Problem List:  Chronic leukopenia and thrombocytopenia 2/2 cirrhosis Type 2 Diabetes Mellitus GERD Depression Hx Seizures Hypothyroidism Asthma  Disposition: home  Discharge Condition: stable, improving  Discharge Exam:  Temp:  [97.6 F (36.4 C)-98.9 F (37.2 C)] 97.6 F (36.4 C) (02/24 0549) Pulse Rate:  [73-81] 73 (02/24 0549) Resp:  [16] 16 (02/24 0549) BP: (92-109)/(51-67) 96/67 (02/24 0549) SpO2:  [94 %-97 %] 95 % (02/24 0810) Weight:  [239 lb 13.8 oz (108.8 kg)] 239 lb 13.8 oz (108.8 kg) (02/24 0500) Physical Exam: General: awake and alert, laying in bed, NAD  Cardiovascular: RRR, no MRG Respiratory: slight crackles at bases bilaterally, no increased work of breathing  Abdomen: soft, non tender, non distended, bowel sounds normal  Extremities: tenderness to palpation of left calf, able to fully flex and extend knee with tenderness, no edema Skin: decreased erythema in comparison to marked borders      Brief Hospital Course:  Holly Mueller is a 52 yo F with medical history notable for chronic leukopenia and thrombocytopenia secondary to cirrhosis,diabetes, asthma, hx of seizures, depression, hypothyroidism, and GERD who presented to the ED with LLE pain worsening over two days. On admission, patient's left leg was mildly swollen with erythema, warmth, and tenderness and she was febrile with a 2L O2 requirement (but otherwise HDS). Admission workup revealed thrombocytopenia and leukopenia consistent with prior, CXR/CTA showed mild interstitial  inflammation, ground glass opacities, and possible edema consistent with previous imaging, and workup for PE and DVT was negative. She was started on Keflex on 2/20-2/22 for presumed cellulitis and transitioned to IV CTX for one day due to possible worsening. Patient improved with IV CTX and transitioned to oral Keflex. Patient continued to improve and was afebrile for >24hrs prior to discharge. Patient was discharged on oral Keflex for total of 10 days course of antibiotic (last day of antibiotic will be 2/28.) Tramadol PRN given for pain.  During admission patient had acute need for supplemental O2. Patient ambulated with pulse ox prior to discharge which was normal.. Patient was stable on room air prior to discharge.   Issues for Follow Up:  1. Patient has had 2 UAs with hematuria 2 weeks apart. Will need outpatient evaluation for possible causes and possible referral to urology.  2. Patient has evidence of cirrhosis on CTA Chest as well as on RUQ [place] 06/15/17 (portal HTN, splenomegaly, nodular liver) and an elevated INR (1.38); pt has been referred to GI by hematology but has not seen them yet.  3. Continue oral Keflex 554m q6hrs for 5 more days (total 10 days of abx). End date 2/28 4. Recommend evaluation for OSA  Significant Procedures: none   Significant Labs and Imaging:  Recent Labs  Lab 07/02/17 0802 07/03/17 0303 07/04/17 0244  WBC 2.0* 1.6* 1.6*  HGB 10.8* 10.7* 10.7*  HCT 31.6* 32.0* 31.8*  PLT 98* 109* 104*   Recent Labs  Lab 06/29/17 1842 06/30/17 0819 07/01/17 0337 07/02/17 0802 07/03/17 0303  NA 135 133* 132* 134* 134*  K 3.9 3.5 3.4* 3.7 3.6  CL 108 105 104 104 103  CO2 17* 20* 20* 22 23  GLUCOSE 136*  127* 168* 142* 134*  BUN _0 CREATININE 0.62 0.82 0.75 0.61 0.60  CALCIUM 9.1 8.7* 8.6* 8.5* 8.5*   CTA Chest IMPRESSION: 1. No pulmonary embolus. 2. No focal consolidation. 3. Faint basilar predominant ground-glass opacities are similar in appearance  and distribution to exam 7 months prior, and may reflect chronic or recurrent mild edema or alveolitis. 4. Hepatic cirrhosis and upper abdominal adenopathy is unchanged.  CXR IMPRESSION: 1. Low lung volumes 2. Mild diffuse increased interstitial opacities suggesting acute mild interstitial inflammatory process or edema on underlying chronic change 3. Borderline to mild cardiomegaly   Results/Tests Pending at Time of Discharge: none  Discharge Medications:  Allergies as of 07/04/2017      Reactions   Aspirin Nausea Only   Upset stomach      Medication List    TAKE these medications   albuterol (2.5 MG/3ML) 0.083% nebulizer solution Commonly known as:  PROVENTIL Take 3 mLs (2.5 mg total) by nebulization every 6 (six) hours as needed for wheezing or shortness of breath.   albuterol 108 (90 Base) MCG/ACT inhaler Commonly known as:  PROAIR HFA INHALE 2 PUFFS INTO THE LUNGS EVERY 6 HOURS AS NEEDED FOR SHORTNESS OF BREATH   benzonatate 100 MG capsule Commonly known as:  TESSALON Take 1 capsule (100 mg total) by mouth 3 (three) times daily as needed for cough.   blood glucose meter kit and supplies Kit Dispense based on patient and insurance preference. Use up to four times daily as directed. (FOR ICD-9 250.00, 250.01).   budesonide-formoterol 160-4.5 MCG/ACT inhaler Commonly known as:  SYMBICORT Inhale 2 puffs into the lungs 2 (two) times daily.   cephALEXin 500 MG capsule Commonly known as:  KEFLEX Take 1 capsule (500 mg total) by mouth every 6 (six) hours for 5 days.   cetirizine 10 MG tablet Commonly known as:  ZYRTEC Take 1 tablet (10 mg total) by mouth daily.   fluticasone 50 MCG/ACT nasal spray Commonly known as:  FLONASE Place 2 sprays into both nostrils daily.   hydrocortisone 2.5 % ointment Apply topically 2 (two) times daily.   levothyroxine 125 MCG tablet Commonly known as:  SYNTHROID, LEVOTHROID Take 2 tablets (250 mcg total) by mouth daily.    metFORMIN 500 MG tablet Commonly known as:  GLUCOPHAGE Take 1 tablet (500 mg total) by mouth 2 (two) times daily with a meal.   pantoprazole 40 MG tablet Commonly known as:  PROTONIX Take 1 tablet (40 mg total) by mouth daily.   promethazine-dextromethorphan 6.25-15 MG/5ML syrup Commonly known as:  PROMETHAZINE-DM Take 5 mLs by mouth 4 (four) times daily as needed for cough.   traMADol 50 MG tablet Commonly known as:  ULTRAM Take 1 tablet (50 mg total) by mouth every 12 (twelve) hours as needed for moderate pain or severe pain.            Durable Medical Equipment  (From admission, onward)        Start     Ordered   07/04/17 0857  For home use only DME 3 n 1  Once     07/04/17 0856   07/04/17 0855  For home use only DME Walker rolling  Once    Question:  Patient needs a walker to treat with the following condition  Answer:  Impaired ambulation   07/04/17 0856      Discharge Instructions: Please refer to Patient Instructions section of EMR for full details.  Patient was counseled  important signs and symptoms that should prompt return to medical care, changes in medications, dietary instructions, activity restrictions, and follow up appointments.   Follow-Up Appointments: Follow-up Information    Mercy Riding, MD. Go on 07/08/2017.   Specialty:  Family Medicine Why:  @ 8:30am (please arrive at least 22mn early) Contact information: [phone]           GSmiley Houseman MD 07/04/2017, 9:23 AM PGY3, [place]

## 2017-07-01 NOTE — Progress Notes (Signed)
Physical Therapy Treatment Patient Details Name: CARIN SHIPP MRN: [id number] DOB: February 19, 1966 Today's Date: 07/01/2017    History of Present Illness TAKERRA LUPINACCI is a 52 y.o. female presenting with leg pain x 2 days. On admission pulmonary edema was found on CXR. PMH is significant for chronic leukopenia and thromobcytopenia, diabetes, hx of asthma, hx of seizures, depression, hypothyroidism, OA, GERD.    PT Comments    Patient progressing with ability to practice steps and able to walk little further, though with increased pain.  Will gave handout for stair technique to show helper at home.    Follow Up Recommendations  No PT follow up;Supervision/Assistance - 24 hour     Equipment Recommendations  Rolling walker with 5" wheels    Recommendations for Other Services       Precautions / Restrictions Precautions Precautions: Fall Precaution Comments: L LE pain Restrictions Weight Bearing Restrictions: No    Mobility  Bed Mobility Overal bed mobility: Modified Independent                Transfers Overall transfer level: Needs assistance Equipment used: Rolling walker (2 wheeled) Transfers: Sit to/from [facility] Sit to Stand: Min guard Stand pivot transfers: Min guard       General transfer comment: assist for balance, safety  Ambulation/Gait Ambulation/Gait assistance: Min guard Ambulation Distance (Feet): 20 Feet Assistive device: Rolling walker (2 wheeled) Gait Pattern/deviations: Step-to pattern;Trunk flexed;Decreased stride length;Antalgic     General Gait Details: difficulty tolerating distance due to pain increased   Stairs Stairs: Yes   Stair Management: Step to pattern;Backwards;With walker Number of Stairs: 2 General stair comments: assist for walker stability, cues for technique; issued handout   Wheelchair Mobility    Modified Rankin (Stroke Patients Only)       Balance Overall balance assessment: Needs  assistance Sitting-balance support: No upper extremity supported Sitting balance-Leahy Scale: Normal     Standing balance support: During functional activity;Bilateral upper extremity supported Standing balance-Leahy Scale: Poor Standing balance comment: dependent on UE assist due to L foot pain                            Cognition Arousal/Alertness: Awake/alert Behavior During Therapy: WFL for tasks assessed/performed Overall Cognitive Status: Within Functional Limits for tasks assessed                                        Exercises Other Exercises Other Exercises: encouraged ankle pumps, AROM for increased DF L foot as tolerated    General Comments        Pertinent Vitals/Pain Pain Assessment: Faces Faces Pain Scale: Hurts even more Pain Location: left calf up into the back of the knee Pain Descriptors / Indicators: Discomfort;Shooting;Throbbing Pain Intervention(s): Monitored during session;Repositioned    Home Living Family/patient expects to be discharged to:: Private residence Living Arrangements: Children;Non-relatives/Friends Available Help at Discharge: Family;Friend(s);Available 24 hours/day Type of Home: House Home Access: Stairs to enter Entrance Stairs-Rails: None Home Layout: One level Home Equipment: None Additional Comments: roomate works during the day, kids are 39yo and 17 yo, not in school, don't work    Prior Function Level of Independence: Independent      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[employer] affairs [employer] Goals (current goals can now be found in the care plan section) Acute Rehab PT Goals  Patient Stated Goal: Get her left leg to quit hurting Progress towards PT goals: Progressing toward goals    Frequency    Min 3X/week      PT Plan Current plan remains appropriate    Co-evaluation              AM-PAC PT "6 Clicks" Daily Activity  Outcome Measure  Difficulty turning over in bed (including adjusting  bedclothes, sheets and blankets)?: None Difficulty moving from lying on back to sitting on the side of the bed? : None Difficulty sitting down on and standing up from a chair with arms (e.g., wheelchair, bedside commode, etc,.)?: Unable Help needed moving to and from a bed to chair (including a wheelchair)?: A Little Help needed walking in hospital room?: A Little Help needed climbing 3-5 steps with a railing? : A Little 6 Click Score: 18    End of Session Equipment Utilized During Treatment: Gait belt Activity Tolerance: Patient limited by pain Patient left: with call bell/phone within reach;in chair   PT Visit Diagnosis: Other abnormalities of gait and mobility (R26.89);Pain Pain - Right/Left: Left Pain - part of body: Ankle and joints of foot     Time: 1145-1211 PT Time Calculation (min) (ACUTE ONLY): 26 min  Charges:  $Gait Training: 8-22 mins $Therapeutic Activity: 8-22 mins                    G CodesMagda Kiel, [place] [phone] 07/01/2017    Reginia Naas 07/01/2017, 12:25 PM

## 2017-07-01 NOTE — Progress Notes (Signed)
Family Medicine Teaching Service Daily Progress Note Intern Pager: [phone]  Patient name: Holly Mueller Medical record number: [id number] Date of birth: 1965-11-06 Age: 52 y.o. Gender: female  Primary Care Provider: [name], MD Consultants: diabetes coordinator Code Status: DNI  Pt Overview and Major Events to Date:  06/29/2017: Admitted 06/30/2017: started keflex for cellulitis  Assessment and Plan: Holly Mauceri Sumneris a 52 y.o.femalepresenting with leg pain x 2 days. On admission, CXR with pulmonary edema. Medical history is significant forchronic leukopenia and thromobcytopenia, cirrhosis,diabetes,hx of asthma, hx of seizures, depression, hypothyroidism, OA, GERD.  LE Cellulitis  Fever: Pt presenting w LLE pain and fever, found to have mild erythema and warmth of her posterior calf in the setting of negative DVT workup. She was initially febrile and tachycardic in the ED but this has resolved shortly and she has been HDS overnight. Initiated cephalexin yesterday for presumed cellulitis, however, on exam this morning, warmth and redness extending beyond marked borders with small petechiae present.  - continue cephalexin 563m Q6H (2/20 - ); will re-eval after 48hrs abx to determine whether or not to broaden coverage - f/u Bcx - f/u Ucx to rule out other fever source, although UA only notable for hematuria (see below) - monitor fever curve - tylenol prn pain  Pulmonary edema  Acute Hypoxia: Pt denies SOB but was febrile on presentation and complained of productive cough x 3 mo;CXR on admission w diffuse increased interstitial opacities suggesting mild interstitial inflammation and edema with similar seen on CTA 2/20.Overnight, pt weaned to RA satting >95% and HDs. Unclear etiology at this point, possibly post-viral PNA (pt influenza postive 2/4 and leukopenic), fluid overload (although nl BNP, relatively unremarkable echo), bronchiectasis, or delayed sequalae of  influenza pt was hospitalized for previously.  - f/u Bcx - supplemental O2 w goal  >94%, wean O2 as tolerated to RA; plan to ambulate pt today - tessalon pearls prn - continue home dulera BID, flonase - I/O, daily weights - PT/OT: HH OT and rolling walker  Hypokalemia: K 3.4 today - K-Clor 40 mEq today - CTM  Cardiomegaly: Borderline to mild cardiomegaly on CXR, new since CXR 2/7. 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  - CTM - I/O, daily weights  Leukopenia  Thrombocytopenia: Stable, chronic. WBC 2.6 and platelets 87 today, stable since admission.  Followed by Heme/Onc who suggest most likely etiology is cirrhosis due to NAFLD.  - CTM; recommend outpatient GI follow-up for cirrhosis  T2DM: Last A1C 9.1 06/15/17. On metformin 5069mBID at home - hold metformin while hospitalized  - sSSI, CBG 4x daily - diabetes education consulted  Asthma: chronic, stable - continue home flonase and dulera  Seizures: Last seizure in 1970s, previously treated with phenobarbital.  - CTM  Depression: not currently on treatment  Hypothyroidism: TSH 10.886 on admission - increase to 25036mlevothyroxine (home dose 125m8m OA: Does not use CPAP or BIPAP at home overnight.  - CTM, consider CPAP nightly to see if resp status improves  GERD: chronic, stable - continue home Protonix40mg84mly  FEN/GI:heart healthy/carb modified Prophylaxis:lovenox  Disposition: continued inpatient stay for monitoring   Subjective:  Patient reports that the pain in her leg has continued and she has a headache (although she has these chronically).  She has no other acute concerns. She reports her cough has been present since November and has been productive of thick white-ish sputum. She denies alcohol use.   Objective: Temp:  [98.4 F (36.9 C)-99.3  F (37.4 C)] 98.4 F (36.9 C) (02/21 0531) Pulse Rate:  [83-94] 89 (02/21 0531) Resp:   [18-19] 19 (02/21 0531) BP: (101-116)/(65-75) 116/75 (02/21 0531) SpO2:  [95 %-97 %] 95 % (02/21 0531) Weight:  [240 lb 4.8 oz (109 kg)] 240 lb 4.8 oz (109 kg) (02/21 0531)   Physical Exam: General: Alert, oriented, NAD. Sitting up comfortably in bed without O2 via [place] Cardiovascular: RRR, no m/r/g Respiratory: fine crackles appreciated in LL bilaterally, no wheezing. Normal WOB on RA.  Abdomen: soft, nontender, nondistended, normoactive bowel sounds Extremities: WWP, no appreciable edema. Marker pen outlining area of erythema from 2/20 present; superficial erythema now extending several inches beyond that border, particularly down the posterior calf but also into the popliteal fossa in an irregular pattern. Multiple small petechiae appreciated throughout area of erythema. Area is warm to touch and very tender to palpation.   Laboratory: Recent Labs  Lab 06/29/17 1842 06/30/17 0819 07/01/17 0337  WBC 2.4* 3.0* 2.6*  HGB 12.8 11.7* 11.3*  HCT 37.4 33.9* 33.3*  PLT 90* 86* 87*   Recent Labs  Lab 06/29/17 1842 06/30/17 0819 07/01/17 0337  NA 135 133* 132*  K 3.9 3.5 3.4*  CL 108 105 104  CO2 17* 20* 20*  BUN _0 CREATININE 0.62 0.82 0.75  CALCIUM 9.1 8.7* 8.6*  GLUCOSE 136* 127* 168*     Imaging/Diagnostic Tests: none  Holly Mueller, Medical Student 07/01/2017, 7:42 AM MS4, [place] Intern pager: [phone], text pages welcome  FPTS Upper-Level Resident Addendum  I have independently interviewed and examined the patient. I have discussed the above with the original author and agree with their documentation. Please see also any attending notes.   Bufford Lope, DO PGY-2, [facility] 07/01/2017 2:41 PM  FPTS Service pager: [phone] (text pages welcome through [place])

## 2017-07-01 NOTE — Progress Notes (Signed)
FPTS Interim Progress Note  S: Paged to bedside by RN for 10/10 pain of LLE. On arrival, patient lying in bed in NAD but mildly uncomfortable. She reports that the pain in her left leg is bothering her but is similar in intensity to yesterday evening, just further down her calf and less-so behind her knee. She is having difficulty with ambulation.   O: BP 108/72 (BP Location: Left Arm)   Pulse 82   Temp 98.4 F (36.9 C) (Oral)   Resp 16   Ht _0  (1.676 m)   Wt 240 lb 4.8 oz (109 kg)   SpO2 94%   BMI 38.79 kg/m   GEN: NAD, breathing comfortably on RA, nontoxic appearing, conversant Ext: Erythema extending further than exam from this morning and significantly further than the borders marked 2/20 (see image below). Non purulent. Warm to touch. Tender to palpation along erythematous portion. No palpable cords, crepitus, fluctuance, or masses. 2+ pedal pulses. Full ROM of L foot, calf not tense.      A/P: Appearance of infection most consistent with erysipelas vs cellulitis; spread of erythema and persistence of symptoms concerning for worsening infection despite initiation of cephalexin ~34 hours ago; not apparent yet if this is a treatment failure or if she needs more time to respond to antibiotics. However, patient is without systemic signs/symptoms of infection and would expect cephalexin to cover beta hemolytic strep, so will continue current antibiotic source with low threshold to broaden to CTX if pt shows signs of systemic illness.  - Remarked borders consistent with erythema pictured above - Tramadol 59m Q12H prn pain - Will broaden to IV CTX if erythema extends overnight or pt develops signs of systemic illness  MDimple Casey Medical Student 07/01/2017, 9:54 PM MS4, CCairopager [phone] FPTS Upper-Level Resident Addendum  I have independently interviewed and examined the patient. I have discussed the above with the original author and agree  with their documentation and helped formulate the above assessment and plan. Close monitoring overnight and will broaden to IV Ctx if patient develops fever or worsening erythema.  EBufford Lope DO PGY-2, [facility] ParkFamily Medicine 07/01/2017 10:15 PM  FPTS Service pager: [phone](text pages welcome through [place]

## 2017-07-02 LAB — CBC
HEMATOCRIT: 31.6 % — AB (ref 36.0–46.0)
Hemoglobin: 10.8 g/dL — ABNORMAL LOW (ref 12.0–15.0)
MCH: 29.8 pg (ref 26.0–34.0)
MCHC: 34.2 g/dL (ref 30.0–36.0)
MCV: 87.3 fL (ref 78.0–100.0)
PLATELETS: 98 10*3/uL — AB (ref 150–400)
RBC: 3.62 MIL/uL — AB (ref 3.87–5.11)
RDW: 14.9 % (ref 11.5–15.5)
WBC: 2 10*3/uL — ABNORMAL LOW (ref 4.0–10.5)

## 2017-07-02 LAB — BASIC METABOLIC PANEL
ANION GAP: 8 (ref 5–15)
BUN: 8 mg/dL (ref 6–20)
CALCIUM: 8.5 mg/dL — AB (ref 8.9–10.3)
CO2: 22 mmol/L (ref 22–32)
Chloride: 104 mmol/L (ref 101–111)
Creatinine, Ser: 0.61 mg/dL (ref 0.44–1.00)
Glucose, Bld: 142 mg/dL — ABNORMAL HIGH (ref 65–99)
POTASSIUM: 3.7 mmol/L (ref 3.5–5.1)
Sodium: 134 mmol/L — ABNORMAL LOW (ref 135–145)

## 2017-07-02 LAB — GLUCOSE, CAPILLARY
GLUCOSE-CAPILLARY: 135 mg/dL — AB (ref 65–99)
GLUCOSE-CAPILLARY: 137 mg/dL — AB (ref 65–99)
Glucose-Capillary: 129 mg/dL — ABNORMAL HIGH (ref 65–99)
Glucose-Capillary: 112 mg/dL — ABNORMAL HIGH (ref 65–99)

## 2017-07-02 MED ORDER — SODIUM CHLORIDE 0.9 % IV SOLN
1.0000 g | INTRAVENOUS | Status: DC
  Administered 2017-07-02: 1 g via INTRAVENOUS
  Filled 2017-07-02: qty 10

## 2017-07-02 MED ORDER — ALBUTEROL SULFATE (2.5 MG/3ML) 0.083% IN NEBU
2.5000 mg | INHALATION_SOLUTION | Freq: Four times a day (QID) | RESPIRATORY_TRACT | Status: DC | PRN
  Administered 2017-07-02: 2.5 mg via RESPIRATORY_TRACT
  Filled 2017-07-02: qty 3

## 2017-07-02 NOTE — Progress Notes (Signed)
Occupational Therapy Treatment Patient Details Name: Holly Mueller MRN: [id number] DOB: 05-13-1965 Today's Date: 07/02/2017    History of present illness Holly Mueller is a 52 y.o. female presenting with leg pain x 2 days. On admission pulmonary edema was found on CXR. PMH is significant for chronic leukopenia and thromobcytopenia, diabetes, hx of asthma, hx of seizures, depression, hypothyroidism, OA, GERD.   OT comments  Pt progressing towards acute OT goals. Pt continues to have functional limitations with ADLs due to LLE pain. Discussed shower transfer and LB ADL strategies. Pt completed functional mobility in the room with 1 seated rest break needed due to LLE pain.   Follow Up Recommendations  Home health OT    Equipment Recommendations  3 in 1 bedside commode    Recommendations for Other Services      Precautions / Restrictions Precautions Precautions: Fall Precaution Comments: L LE pain Restrictions Weight Bearing Restrictions: No       Mobility Bed Mobility Overal bed mobility: Modified Independent             General bed mobility comments: used bed rail to elevated trunk  Transfers Overall transfer level: Needs assistance Equipment used: Rolling walker (2 wheeled) Transfers: Sit to/from Stand Sit to Stand: Min guard;Min assist         General transfer comment: assist for balance, safety. Steadying assist due to LLE pain, keeps LLE in PWB position.     Balance Overall balance assessment: Needs assistance Sitting-balance support: No upper extremity supported Sitting balance-Leahy Scale: Normal     Standing balance support: During functional activity;Bilateral upper extremity supported Standing balance-Leahy Scale: Poor Standing balance comment: dependent on UE assist due to L foot pain                           ADL either performed or assessed with clinical judgement   ADL Overall ADL's : Needs assistance/impaired                          Toilet Transfer: Minimal assistance;Ambulation;BSC;RW Toilet Transfer Details (indicate cue type and reason): simulated in the room         Functional mobility during ADLs: Minimal assistance;Rolling walker General ADL Comments: Min guard to min A to ambulate to bathroom and back to bed with 1 seated rest break incorporate due to pain in LLE. Discussed using 3n1 as shower seat and provided handout.      Vision       Perception     Praxis      Cognition Arousal/Alertness: Awake/alert Behavior During Therapy: WFL for tasks assessed/performed Overall Cognitive Status: Within Functional Limits for tasks assessed                                          Exercises     Shoulder Instructions       General Comments      Pertinent Vitals/ Pain       Pain Assessment: Faces Faces Pain Scale: Hurts worst Pain Location: left calf up into the back of the knee, increased pain with standing and mobiltiy Pain Descriptors / Indicators: Discomfort;Shooting;Throbbing Pain Intervention(s): Limited activity within patient's tolerance;Monitored during session;Repositioned  Home Living  Prior Functioning/Environment              Frequency  Min 2X/week        Progress Toward Goals  OT Goals(current goals can now be found in the care plan section)  Progress towards OT goals: Progressing toward goals  Acute Rehab OT Goals Patient Stated Goal: Get her left leg to quit hurting OT Goal Formulation: With patient Time For Goal Achievement: 07/15/17 Potential to Achieve Goals: Good ADL Goals Pt Will Perform Grooming: with modified independence;standing Pt Will Perform Lower Body Dressing: with supervision;sit to/from stand Pt Will Transfer to Toilet: with supervision;ambulating Pt Will Perform Toileting - Clothing Manipulation and hygiene: with supervision;sit to/from stand Pt Will  Perform Tub/Shower Transfer: with supervision;shower seat;ambulating;rolling walker  Plan Discharge plan remains appropriate    Co-evaluation                 AM-PAC PT "6 Clicks" Daily Activity     Outcome Measure   Help from another person eating meals?: None Help from another person taking care of personal grooming?: A Little Help from another person toileting, which includes using toliet, bedpan, or urinal?: A Little Help from another person bathing (including washing, rinsing, drying)?: A Little Help from another person to put on and taking off regular upper body clothing?: None Help from another person to put on and taking off regular lower body clothing?: A Little 6 Click Score: 20    End of Session Equipment Utilized During Treatment: Rolling walker;Other (comment)(pt on RA upon therapist arrival)  OT Visit Diagnosis: Unsteadiness on feet (R26.81);Muscle weakness (generalized) (M62.81);Pain Pain - Right/Left: Left Pain - part of body: Leg   Activity Tolerance Patient limited by pain   Patient Left in bed;with call bell/phone within reach   Nurse Communication          Time: 5638-9373 OT Time Calculation (min): 31 min  Charges: OT General Charges $OT Visit: 1 Visit OT Treatments $Self Care/Home Management : 23-37 mins     Hortencia Pilar 07/02/2017, 11:02 AM

## 2017-07-02 NOTE — Progress Notes (Signed)
Patient c/o chest congestion, wonders if she can have nebulizer tx as she states she takes these at home. RN paged MD, Dr. Shawna Orleans returned call, states patient can have albuterol, she will put in order.  Also RN made Dr. Shawna Orleans aware patient has temp of 100.4 and is coughing and RN is going to give Acetaminophen and Tessalon as ordered, Dr. Shawna Orleans agreed and instructed RN to monitor patient's left lower leg closely for spreading of redness.  RN will continue to monitor patient closely and make MD aware of any changes.  P.J. Linus Mako, RN

## 2017-07-02 NOTE — Progress Notes (Signed)
Dr. Shawna Orleans called RN and made RN aware that patient will be starting IV antibiotics.  She states she has ordered Ceftriaxone.  RN will start dose when available.  P.J. Linus Mako, RN

## 2017-07-02 NOTE — Progress Notes (Signed)
Left lower leg remains the same and no extension of redness noted, patient states her pain level is a 2-3/10 at this time and voiced she is feeling better.  P.J. Linus Mako, RN

## 2017-07-03 LAB — BASIC METABOLIC PANEL
Anion gap: 8 (ref 5–15)
BUN: 7 mg/dL (ref 6–20)
CALCIUM: 8.5 mg/dL — AB (ref 8.9–10.3)
CHLORIDE: 103 mmol/L (ref 101–111)
CO2: 23 mmol/L (ref 22–32)
CREATININE: 0.6 mg/dL (ref 0.44–1.00)
GFR calc Af Amer: >60 mL/min (ref >60)
GFR calc non Af Amer: >60 mL/min (ref >60)
Glucose, Bld: 134 mg/dL — ABNORMAL HIGH (ref 65–99)
Potassium: 3.6 mmol/L (ref 3.5–5.1)
SODIUM: 134 mmol/L — AB (ref 135–145)

## 2017-07-03 LAB — CBC
HCT: 32 % — ABNORMAL LOW (ref 36.0–46.0)
Hemoglobin: 10.7 g/dL — ABNORMAL LOW (ref 12.0–15.0)
MCH: 29 pg (ref 26.0–34.0)
MCHC: 33.4 g/dL (ref 30.0–36.0)
MCV: 86.7 fL (ref 78.0–100.0)
Platelets: 109 10*3/uL — ABNORMAL LOW (ref 150–400)
RBC: 3.69 MIL/uL — ABNORMAL LOW (ref 3.87–5.11)
RDW: 14.6 % (ref 11.5–15.5)
WBC: 1.6 10*3/uL — ABNORMAL LOW (ref 4.0–10.5)

## 2017-07-03 LAB — GLUCOSE, CAPILLARY
GLUCOSE-CAPILLARY: 136 mg/dL — AB (ref 65–99)
Glucose-Capillary: 154 mg/dL — ABNORMAL HIGH (ref 65–99)
Glucose-Capillary: 127 mg/dL — ABNORMAL HIGH (ref 65–99)
Glucose-Capillary: 105 mg/dL — ABNORMAL HIGH (ref 65–99)
Glucose-Capillary: 167 mg/dL — ABNORMAL HIGH (ref 65–99)

## 2017-07-03 MED ORDER — ACETAMINOPHEN 325 MG PO TABS
650.0000 mg | ORAL_TABLET | Freq: Two times a day (BID) | ORAL | Status: DC | PRN
  Administered 2017-07-03: 650 mg via ORAL
  Filled 2017-07-03: qty 2

## 2017-07-03 MED ORDER — ACETAMINOPHEN 650 MG RE SUPP: 650.0000 mg | Freq: Two times a day (BID) | RECTAL | Status: DC | PRN

## 2017-07-03 MED ORDER — WHITE PETROLATUM EX OINT
TOPICAL_OINTMENT | CUTANEOUS | Status: AC
  Administered 2017-07-03: 1
  Filled 2017-07-03: qty 28.35

## 2017-07-03 MED ORDER — CEPHALEXIN 500 MG PO CAPS
500.0000 mg | ORAL_CAPSULE | Freq: Four times a day (QID) | ORAL | Status: DC
  Administered 2017-07-03 – 2017-07-04 (×5): 500 mg via ORAL
  Filled 2017-07-03 (×5): qty 1

## 2017-07-03 NOTE — Progress Notes (Signed)
Family Medicine Teaching Service Daily Progress Note Intern Pager: [phone]  Patient name: Holly Mueller Medical record number: [id number] Date of birth: [birth year] Age: 52 y.o. Gender: female  Primary Care Provider: [name], MD Consultants: Diabetes coordinator, PT/OT Code Status: DNI  Pt Overview and Major Events to Date:  06/29/2017: Admitted 06/30/2017: started keflex for cellulitis  Assessment and Plan: Holly Mueller a 52 y.o.femalepresenting with LLE pain with incidental pulmonary edema found on admission CXR. Medical historyis significant forchronic leukopenia and thrombocytopenia, cirrhosis,diabetes,asthma, hx of seizures, depression, hypothyroidism, OA, GERD.  LLE Erysipelas vs Cellulitis On exam this morning, erythema improved compared with marked borders. Likely that cephalexin constituted appropriate coverage, however, will continue IV CTX at this time given patient has chronic leukopenia. Blood cultures negative x2 days  -can transition from IV CTX (2/22) to PO Keflex, s/p cephalexin 576m Q6H (2/20 - 2/21) - monitor fever curve - tramadol 547mQ12H prn pain - acetaminophen 6501m12 hrs prn  Pulmonary edema Acute Hypoxia CXR and CTA on admissionwdiffuse interstitial opacities (inflammation vs edema).Pt remains on room air (prev on 2L) with O2 sat @ 98%; unclear etiology but possible post-viral congestion vs bronchiectasis (pt complains of 3 mo productive cough) - tessalon pearlsprn - continue home dulera BID, flonase -I/O, daily weights - PT/OT: HH OT and rolling walker - can consider CPAP given OSA history  Pancytopenia Chronic and stable since admission. WBC1.6, platelets 109, Hgb 10.7 today.Followed by Heme/Onc outpatient; felt most likely etiology is cirrhosis due to NAFLD.  -recommend outpatient GI follow-up for cirrhosis  T2DM Last A1C 9.1 06/15/17. 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  -continue to monitor   Asthma Chronic, stable - continue homeflonase and dulera  Seizures Last seizure in 1970s,previously treated with phenobarbital. - continue to monitor   Hypothyroidism TSH 10.886 on admission - continue 250mc21mvothyroxine (home dose 125mcg44mA Does not use CPAP or BIPAP at home overnight.  -continue to monitor, can consider CPAP nightly to see if resp status improves  GERD Chronic, stable - continue homeProtonix40mg d43m  FEN/GI:heart healthy/carb modified Prophylaxis:lovenox  Disposition: will monitor on oral antibiotics prior to discharge   Subjective:  Patient reports doing betters. States pain is improved from admission but still has pain after walking or moving. At rest denies pain. Denies fever or chills. Denies nausea, vomiting, or diarrhea. Reports concern of returning to works. States she works at [employer] not think she will be able to get around with walker and will be too slow with cane.   Objective: Temp:  [97.7 F (36.5 C)-98.9 F (37.2 C)] 97.7 F (36.5 C) (02/23 0630) Pulse Rate:  [76-89] 76 (02/23 0630) Resp:  [16-18] 18 (02/23 0630) BP: (103-113)/(61-69) 113/69 (02/23 0630) SpO2:  [95 %-98 %] 97 % (02/23 0957) Weight:  [239 lb 10.2 oz (108.7 kg)] 239 lb 10.2 oz (108.7 kg) (02/23 0630) Physical Exam: General: awake and alert, laying in bed, NAD  Cardiovascular: RRR, no MRG Respiratory: slight crackles at bases bilaterally, no increased work of breathing  Abdomen: soft, non tender, non distended, bowel sounds normal  Extremities: tenderness to palpation of left calf, able to fully flex and extend knee with tenderness, no edema Skin: decreased erythema in comparison to marked  borders   Laboratory: Recent Labs  Lab 07/01/17 0337 07/02/17 0802 07/03/17 0303  WBC 2.6* 2.0* 1.6*  HGB 11.3* 10.8* 10.7*  HCT 33.3* 31.6* 32.0*  PLT 87* 98* 109*   Recent Labs  Lab 07/01/17 0337 07/02/17 0802 07/03/17 0303  NA 132* 134* 134*  K 3.4* 3.7 3.6  CL 104 104 103  CO2 20* 22 23  BUN _0 CREATININE 0.75 0.61 0.60  CALCIUM 8.6* 8.5* 8.5*  GLUCOSE 168* 142* 134*    Caroline More, DO 07/03/2017, 11:24 AM PGY-1, [place] Intern pager: [phone], text pages welcome

## 2017-07-03 NOTE — Progress Notes (Signed)
SATURATION QUALIFICATIONS: (This note is used to comply with regulatory documentation for home oxygen)  Patient Saturations on Room Air at Rest = 96%  Patient Saturations on Room Air while Ambulating = 95%

## 2017-07-04 LAB — GLUCOSE, CAPILLARY
GLUCOSE-CAPILLARY: 125 mg/dL — AB (ref 65–99)
Glucose-Capillary: 116 mg/dL — ABNORMAL HIGH (ref 65–99)

## 2017-07-04 LAB — CBC
HCT: 31.8 % — ABNORMAL LOW (ref 36.0–46.0)
Hemoglobin: 10.7 g/dL — ABNORMAL LOW (ref 12.0–15.0)
MCH: 29.2 pg (ref 26.0–34.0)
MCHC: 33.6 g/dL (ref 30.0–36.0)
MCV: 86.9 fL (ref 78.0–100.0)
PLATELETS: 104 10*3/uL — AB (ref 150–400)
RBC: 3.66 MIL/uL — ABNORMAL LOW (ref 3.87–5.11)
RDW: 14.6 % (ref 11.5–15.5)
WBC: 1.6 10*3/uL — AB (ref 4.0–10.5)

## 2017-07-04 MED ORDER — TRAMADOL HCL 50 MG PO TABS: 50.0000 mg | ORAL_TABLET | Freq: Two times a day (BID) | ORAL | 0 refills | Status: DC | PRN

## 2017-07-04 MED ORDER — CEPHALEXIN 500 MG PO CAPS: 500.0000 mg | ORAL_CAPSULE | Freq: Four times a day (QID) | ORAL | 0 refills | Status: AC

## 2017-07-04 MED ORDER — CEPHALEXIN 500 MG PO CAPS: 500.0000 mg | ORAL_CAPSULE | Freq: Four times a day (QID) | ORAL | 0 refills | Status: DC

## 2017-07-04 NOTE — Plan of Care (Signed)
Progressing.

## 2017-07-04 NOTE — Progress Notes (Signed)
Edilia Bo to be D/C'd home per MD order. Discussed with the patient and all questions fully answered. VVS, Skin clean, dry and intact without evidence of skin break down, no evidence of skin tears noted.  IV catheter discontinued intact. Site without signs and symptoms of complications. Dressing and pressure applied.  An After Visit Summary was printed and given to the patient.  Patient escorted via [place], and D/C home via private auto.  Melonie [place]  07/04/2017 11:49 AM

## 2017-07-04 NOTE — Progress Notes (Signed)
Family Medicine Teaching Service Daily Progress Note Intern Pager: [phone]  Patient name: Holly Mueller Medical record number: [id number] Date of birth: 1965-10-05 Age: 52 y.o. Gender: female  Primary Care Provider: [name], MD Consultants: Diabetes coordinator, PT/OT Code Status: DNI  Pt Overview and Major Events to Date:  06/29/2017: Admitted 06/30/2017: started keflex for cellulitis  Assessment and Plan: Iraida Cragin Sumneris a 52 y.o.femalepresenting with LLE pain with incidental pulmonary edema found on admission CXR. Medical historyis significant forchronic leukopenia and thrombocytopenia, cirrhosis,diabetes,asthma, hx of seizures, depression, hypothyroidism, OA, GERD.  LLE Erysipelas vs Cellulitis, Improving On exam this morning, erythema improved compared with marked borders.  Blood cultures negative x3 days.  -IV CTX (2/22) > PO Keflex (2/23 >>), s/p cephalexin 582m Q6H (2/20 - 2/21). Total antibiotic course of 10 days (will send home with 5 more days of Keflex). - monitor fever curve - tramadol 594mQ12H prn pain - acetaminophen 65027m12 hrs prn  Pulmonary edema Acute Hypoxia, Resolved  CXR and CTA on admissionwdiffuse interstitial opacities (inflammation vs edema).Pt remains on room air (prev on 2L) with O2 sat @ 98%; unclear etiology but possible post-viral congestion vs bronchiectasis (pt complains of 3 mo productive cough). Pulse ox while ambulating was normal yesterday. - tessalon pearlsprn - continue home dulera BID, flonase - PT/OT: HH OT and rolling walker - can consider CPAP given OSA history  Pancytopenia, Chronic Chronic and stable since admission. WBC1.6, platelets 109, Hgb 10.7 today.Followed by Heme/Onc outpatient; felt most likely etiology is cirrhosis due to NAFLD.  -recommend outpatient GI follow-up for cirrhosis  T2DM Last A1C 9.1 06/15/17. 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  -continue to monitor   Asthma Chronic, stable - continue homeflonase and dulera  Seizures Last seizure in 1970s,previously treated with phenobarbital. - continue to monitor   Hypothyroidism TSH 10.886 on admission - continue 250mc50mvothyroxine (home dose 125mcg47mA Does not use CPAP or BIPAP at home overnight.  -continue to monitor, can consider CPAP nightly to see if resp status improves  GERD Chronic, stable - continue homeProtonix40mg d35m  FEN/GI:heart healthy/carb modified Prophylaxis:lovenox  Disposition: discharge today    Subjective:  Patient reports doing betters. States pain is improved from admission but still has pain after walking or moving.  Denies fever or chills. Reports concern of returning to works. States she works at [employer] not think she will be able to get around with walker and will be too slow with cane.   Objective: Temp:  [97.6 F (36.4 C)-98.9 F (37.2 C)] 97.6 F (36.4 C) (02/24 0549) Pulse Rate:  [73-81] 73 (02/24 0549) Resp:  [16] 16 (02/24 0549) BP: (92-109)/(51-67) 96/67 (02/24 0549) SpO2:  [94 %-97 %] 95 % (02/24 0810) Weight:  [239 lb 13.8 oz (108.8 kg)] 239 lb 13.8 oz (108.8 kg) (02/24 0500) Physical Exam: General: awake and alert, laying in bed, NAD  Cardiovascular: RRR, no MRG Respiratory: slight crackles at bases bilaterally, no increased work of breathing  Abdomen: soft, non tender, non distended, bowel sounds normal  Extremities: tenderness to palpation of left calf, able to fully flex and extend knee with tenderness, no edema Skin: decreased erythema in comparison to marked borders      Laboratory: Recent Labs  Lab 07/02/17 0802 07/03/17 0303 07/04/17 0244  WBC 2.0* 1.6* 1.6*   HGB 10.8* 10.7* 10.7*  HCT 31.6* 32.0* 31.8*  PLT 98* 109* 104*   Recent Labs  Lab 07/01/17 0337 07/02/17 0802 07/03/17 0303  NA 132* 134* 134*  K 3.4* 3.7 3.6  CL 104 104 103  CO2 20* 22 23  BUN _0 CREATININE 0.75 0.61 0.60  CALCIUM 8.6* 8.5* 8.5*  GLUCOSE 168* 142* 134*    Smiley Houseman, MD 07/04/2017, 8:15 AM PGY-3, [place] Intern pager: [phone], text pages welcome

## 2017-07-04 NOTE — Discharge Instructions (Signed)
You were initially on IV antibiotics for your cellulitis and then transitioned to oral Keflex. You have five more days of Keflex to take (to complete a 10 day course of antibiotics). You can take Tramadol as needed for pain.   You have a follow up appointment at our clinic to see how your leg is improving.

## 2017-07-04 NOTE — Care Management Note (Addendum)
Case Management Note  Patient Details  Name: Holly Mueller MRN: [id number] Date of Birth: 25-Mar-1966  Subjective/Objective:          Pt presented from home for LLE pain and pulmonary edema.  Pt from home with 2 sons and a roommate.  Pt plans to return to home with same and states sons are available to assist her.  Pt wants RW and 3n1.    Action/Plan: Pt offered DME and HH PT/OT.  Pt accepts DME but politely declines HH.  Pt states she has been using potty chair and regular bathroom independently the last few days and her therapy sessions here have been adequate to teach her what to do.  Encouraged pt to f/u with PMD if she changes her mind and wants to pursue [facility] therapy.    DME ordered and will be delivered to pt's room prior to d/c.   Pt states sons will be at home to supervise/assist her.   Pt states her brother can pick her up until 82.  After 12, pt may need cab voucher from [place].  Pt states her sons are not able to transport her.    Expected Discharge Date:                  Expected Discharge Plan:  [place]  In-House Referral:  NA  Discharge planning Services  CM Consult  Post Acute Care Choice:  Durable Medical Equipment Choice offered to:  Patient  DME Arranged:  Walker rolling, 3-N-1 DME Agency:  [place]:  NA(Pt refused) [place] Agency:  NA  Status of Service:  Completed, signed off  If discussed at [place] of Stay Meetings, dates discussed:    Additional Comments:  Claudie Leach, RN 07/04/2017, 9:18 AM

## 2017-07-05 LAB — CULTURE, BLOOD (SINGLE)
Culture: NO GROWTH
Special Requests: ADEQUATE

## 2017-07-06 ENCOUNTER — Encounter: Payer: Self-pay | Admitting: Pulmonary Disease

## 2017-07-06 ENCOUNTER — Ambulatory Visit: Payer: Medicaid Other | Admitting: Pulmonary Disease

## 2017-07-06 VITALS — BP 130/82 | HR 84 | Ht 66.0 in | Wt 238.0 lb

## 2017-07-06 DIAGNOSIS — J4520 Mild intermittent asthma, uncomplicated: Secondary | ICD-10-CM | POA: Diagnosis not present

## 2017-07-06 LAB — NITRIC OXIDE: Nitric Oxide: <5

## 2017-07-06 NOTE — Progress Notes (Signed)
Holly Mueller    [id number]    1965/11/30  Primary Care Physician:Timberlake, Curt Bears, MD  Referring Physician: [name], MD [address] [place], [place] [address]  Chief complaint:  Follow up for asthma  HPI: Holly Mueller is a 52 year old with past history of asthma. She has poor control over the past year and needed to go on several tapers of prednisone. She was seen in the ED in July 2017 for an exacerbation. She is currently maintained on Symbicort and albuterol. She needs to use her albuterol every day. She denies any nighttime awakenings. She also has significant issues with sinusitis, postnasal drip with constant clearing of the throat. She has heartburn and is on Prilosec.  Seen in ED in July 2018 for dyspnea, cough, sinus and throat congestion. CTA did not show PE, there was mild atelectasis, GGO at base. PFTs do not show obstruction. There is restriction but no ILD on CT  She is in being followed by Dr. Lebron Conners, hematology for cytopenia status post bone marrow biopsy.  Her low blood counts are felt to be secondary to cirrhosis.  Interim History: Admitted from 2/4-06/19/17 with fevers, diagnosed with flu.  Treated with Tamiflu.  She was seen by GI at that time for evaluation of cirrhosis and recommended outpatient follow-up with Dr. Carlean Purl. Hospitalized again from 2/19-2/24/19 with cellulitis, lower extremity pain.  Workup was negative for PE and DVT.  She was treated with Keflex.  CT showed stable mild groundglass opacities consistent with volume overload.    Reports that breathing is stable.  Still continues on Symbicort and albuterol.  Chief complaint today is left lower extremity pain.  There is no swelling, erythema, fevers, chills.  Outpatient Encounter Medications as of 07/06/2017  Medication Sig  . albuterol (PROAIR HFA) 108 (90 Base) MCG/ACT inhaler INHALE 2 PUFFS INTO THE LUNGS EVERY 6 HOURS AS NEEDED FOR SHORTNESS OF BREATH  . albuterol  (PROVENTIL) (2.5 MG/3ML) 0.083% nebulizer solution Take 3 mLs (2.5 mg total) by nebulization every 6 (six) hours as needed for wheezing or shortness of breath.  . benzonatate (TESSALON) 100 MG capsule Take 1 capsule (100 mg total) by mouth 3 (three) times daily as needed for cough.  . blood glucose meter kit and supplies KIT Dispense based on patient and insurance preference. Use up to four times daily as directed. (FOR ICD-9 250.00, 250.01).  . budesonide-formoterol (SYMBICORT) 160-4.5 MCG/ACT inhaler Inhale 2 puffs into the lungs 2 (two) times daily.  . cephALEXin (KEFLEX) 500 MG capsule Take 1 capsule (500 mg total) by mouth every 6 (six) hours for 5 days.  . cetirizine (ZYRTEC) 10 MG tablet Take 1 tablet (10 mg total) by mouth daily.  . fluticasone (FLONASE) 50 MCG/ACT nasal spray Place 2 sprays into both nostrils daily.  . hydrocortisone 2.5 % ointment Apply topically 2 (two) times daily.  Marland Kitchen levothyroxine (SYNTHROID, LEVOTHROID) 125 MCG tablet Take 2 tablets (250 mcg total) by mouth daily.  . metFORMIN (GLUCOPHAGE) 500 MG tablet Take 1 tablet (500 mg total) by mouth 2 (two) times daily with a meal.  . pantoprazole (PROTONIX) 40 MG tablet Take 1 tablet (40 mg total) by mouth daily.  . promethazine-dextromethorphan (PROMETHAZINE-DM) 6.25-15 MG/5ML syrup Take 5 mLs by mouth 4 (four) times daily as needed for cough.  . traMADol (ULTRAM) 50 MG tablet Take 1 tablet (50 mg total) by mouth every 12 (twelve) hours as needed for moderate pain or severe pain.  No facility-administered encounter medications on file as of 07/06/2017.     Allergies as of 07/06/2017 - Review Complete 07/06/2017  Allergen Reaction Noted  . Aspirin Nausea Only 03/29/2012    Past Medical History:  Diagnosis Date  . Arthritis    bursitis left hip flares-not an issue  . Asthma 2004  . Edentulous    10-19-13 at present  . Epilepsy ([place]) in 1972   No seizures since 1972. Previously treated with phenobarbital.   . Fever  06/2017  . GERD (gastroesophageal reflux disease) 2008  . Headache(784.0)    hx of migraines   . Hypothyroidism 2008  . Lower esophageal ring 08/18/2013  . Seasonal allergies 2003  . Shortness of breath     Past Surgical History:  Procedure Laterality Date  . BALLOON DILATION N/A 10/24/2013   Procedure: BALLOON DILATION;  Surgeon: Gatha Mayer, MD;  Location: [facility];  Service: Endoscopy;  Laterality: N/A;  . CESAREAN SECTION     x2  . DENTAL SURGERY     multiple extractions 3'15  . ESOPHAGOGASTRODUODENOSCOPY N/A 10/24/2013   Procedure: ESOPHAGOGASTRODUODENOSCOPY (EGD);  Surgeon: Gatha Mayer, MD;  Location: [facility];  Service: Endoscopy;  Laterality: N/A;  . LIVER BIOPSY  06/30/2012   Procedure: LIVER BIOPSY;  Surgeon: Shann Medal, MD;  Location: [facility];  Service: General;;  . NOVASURE ABLATION    . SHOULDER ARTHROSCOPY Right 2011  . UMBILICAL HERNIA REPAIR N/A 06/30/2012   Procedure: remove umbilicus;  Surgeon: Shann Medal, MD;  Location: [facility];  Service: General;  Laterality: N/A;  . VENTRAL HERNIA REPAIR N/A 06/30/2012   Procedure: LAPAROSCOPIC VENTRAL HERNIA;  Surgeon: Shann Medal, MD;  Location: [facility];  Service: General;  Laterality: N/A;  With Mesh  . WISDOM TOOTH EXTRACTION      Family History  Problem Relation Age of Onset  . Hypertension Mother   . Diabetes Mother   . Lung cancer Father   . Stroke Father   . Diabetes Sister   . Hypertension Sister     Social History   Socioeconomic History  . Marital status: Single    Spouse name: Not on file  . Number of children: 2  . Years of education: Not on file  . Highest education level: Not on file  Social Needs  . Financial resource strain: Not on file  . Food insecurity - worry: Not on file  . Food insecurity - inability: Not on file  . Transportation needs - medical: Not on file  . Transportation needs - non-medical: Not on file  Occupational History    Employer: [employer]  Tobacco Use   . Smoking status: Former Smoker    Packs/day: 0.25    Types: Cigarettes    Last attempt to quit: 1997    Years since quitting: 22.1  . Smokeless tobacco: Never Used  Substance and Sexual Activity  . Alcohol use: Yes    Alcohol/week: 0.0 oz    Comment: occ  . Drug use: No  . Sexual activity: No  Other Topics Concern  . Not on file  Social History Narrative   Works in Estate manager/land agent at [employer].    Lives with 12 and 14 (boys).    Also living with close friends of the family (mother and two kids, adults).          Review of systems: Review of Systems  Constitutional: Negative for fever and chills.  HENT: Negative.   Eyes:  Negative for blurred vision.  Respiratory: as per HPI  Cardiovascular: Negative for chest pain and palpitations.  Gastrointestinal: Negative for vomiting, diarrhea, blood per rectum. Genitourinary: Negative for dysuria, urgency, frequency and hematuria.  Musculoskeletal: Negative for myalgias, back pain and joint pain.  Skin: Negative for itching and rash.  Neurological: Negative for dizziness, tremors, focal weakness, seizures and loss of consciousness.  Endo/Heme/Allergies: Negative for environmental allergies.  Psychiatric/Behavioral: Negative for depression, suicidal ideas and hallucinations.  All other systems reviewed and are negative.  Physical Exam: Blood pressure 130/82, pulse 84, height 5' 6" (1.676 m), weight 238 lb (108 kg), SpO2 97 %. Gen:      No acute distress HEENT:  EOMI, sclera anicteric Neck:     No masses; no thyromegaly Lungs:    Clear to auscultation bilaterally; normal respiratory effort CV:         Regular rate and rhythm; no murmurs Abd:      + bowel sounds; soft, non-tender; no palpable masses, no distension Ext:    No edema; adequate peripheral perfusion Skin:      Warm and dry; no rash Neuro: alert and oriented x 3 Psych: normal mood and affect  Data Reviewed: Chest x-ray 04/05/15- bibasilar opacities. Chest x-ray  11/16/15-left basal scarring.  CTA 11/23/16- no pulmonary embolism, no interstitial lung disease, cirrhosis, abdominal adenopathy. All images reviewed.  FENO 01/07/17- 6 FENO 07/06/17 <5  PFTs 11/13/16 FVC 1.84 (49%), FEV1 1.52 (51%), F/F 82, TLC 44% Unable to complete DLCO Severe restriction. No obstruction  CBC 12/02/16- WBC 1.4, platelets 66, eosinophil 4%, absolute eosinophil count 100 Blood allergy profile 03/06/16-negative, IgE less than 2  Assessment:  Asthma Upper airway cough syndrome. My suspicion for asthma is low as she does have any obstruction on PFTs and FENO is low. IgE and eos levels are normal.  Her sinusitis, postnasal drip and acid reflux are likely contributing to symptoms Continue Protonix, Flonase Stop Symbicort and monitor symptoms.  Continue albuterol as needed  Plan/Recommendations: - Stop symbicort. continue albuterol - Continue Flonase, protonix  Marshell Garfinkel MD [facility] Pager [phone] 07/06/2017, 12:12 PM  CC: [name], MD

## 2017-07-06 NOTE — Patient Instructions (Signed)
Stop the Symbicort as I do not think you have significant asthma Continue the albuterol as needed Discuss with your primary care if you need a referral to a liver specialist. Follow-up in 6 months

## 2017-07-08 ENCOUNTER — Ambulatory Visit: Payer: Medicaid Other | Admitting: Student

## 2017-07-08 ENCOUNTER — Other Ambulatory Visit: Payer: Self-pay

## 2017-07-08 ENCOUNTER — Encounter: Payer: Self-pay | Admitting: Student

## 2017-07-08 VITALS — BP 112/76 | HR 73 | Temp 98.5°F | Ht 65.0 in | Wt 240.0 lb

## 2017-07-08 DIAGNOSIS — L03116 Cellulitis of left lower limb: Secondary | ICD-10-CM

## 2017-07-08 DIAGNOSIS — R161 Splenomegaly, not elsewhere classified: Secondary | ICD-10-CM | POA: Diagnosis not present

## 2017-07-08 DIAGNOSIS — R3129 Other microscopic hematuria: Secondary | ICD-10-CM | POA: Diagnosis not present

## 2017-07-08 DIAGNOSIS — K7460 Unspecified cirrhosis of liver: Secondary | ICD-10-CM | POA: Diagnosis not present

## 2017-07-08 NOTE — Patient Instructions (Signed)
It was great seeing you today! We have addressed the following issues today  Leg infection/cellulitis: It is very important that you take your antibiotic as prescribed.  You can take Tylenol every 6 hours for the pain.  You may try applying ice 2-3 times a day as well.  Is come back and see [place] if you notice more swelling, redness, fever, worsening of your pain or other symptoms concerning to you.  About your liver, we send a referral to gastroenterologist.  Someone will get in touch with you in the next 2-3 weeks.   If we did any lab work today, and the results require attention, either me or my nurse will get in touch with you. If everything is normal, you will get a letter in mail and a message via . If you don't hear from [place] in two weeks, please give [place] a call. Otherwise, we look forward to seeing you again at your next visit. If you have any questions or concerns before then, please call the clinic at [phone].  Please bring all your medications to every doctors visit  Sign up for My Chart to have easy access to your labs results, and communication with your Primary care physician.    Please check-out at the front desk before leaving the clinic.    Take Care,   Dr. Cyndia Skeeters

## 2017-07-08 NOTE — Progress Notes (Signed)
Subjective:    Holly Mueller is a 52 y.o. old female here for hospital follow-up on cellulitis.  HPI Cellulitis: she is still taking her keflex. It appears she has missed six doses so far.  She was discharged with 20 capsules about 4 days ago.  She still have 10 capsules left in her bottle.  Today, she reports pain in her left leg posteriorly today.  She describes the pain as aching and stabbing.  She rates the pain as 10 out of 10.  However, she also reports some improvement in her pain since she left the hospital.Denies swelling or overlying skin rediness. Pain is on and off. Pain is worse with standing and walking but improves with elevation. She has been taking tramadol and Tylenol as needed. Denies fever or chills.  She denies dyspnea, chest pain, cough or hemoptysis.  She is still using her walker.  She says her work involves a lot of walking and standing.  She does not feel she is ready to go back to work yet.  The patient had left lower extremity Doppler at her recent hospitalization which was negative for DVT.  PMH/Problem List: has Hypothyroidism; DEPRESSION; MIGRAINE HEADACHE; Asthma; DENTAL CARIES; SEIZURES, HX OF; Obesity; Allergic rhinitis; Elevated LFTs; Osteoarthritis; Healthcare maintenance; Umbilical hernia; Hip pain, bilateral; Ganglion cyst of wrist; Dyspnea; Lower esophageal ring; Bursitis of left hip; Abscess of skin of abdomen; Precordial chest pain; Chronic pain of left thumb; Cough; Epistaxis; GERD (gastroesophageal reflux disease); Bicytopenia; Cirrhosis of liver without ascites ([place]); Thrombocytopenia ([place]); Leukopenia; Neutropenic fever ([place]); Influenza A; Bibasilar crackles; Influenza-like illness; Pulmonary edema; Left leg pain; Type 2 diabetes mellitus without complication, without long-term current use of insulin ([place]); and Cellulitis of left lower extremity on their problem list.   has a past medical history of Arthritis, Asthma (2004), Edentulous, Epilepsy ([place]) (in 1972),  Fever (06/2017), GERD (gastroesophageal reflux disease) (2008), Headache(784.0), Hypothyroidism (2008), Lower esophageal ring (08/18/2013), Seasonal allergies (2003), and Shortness of breath.  FH:  Family History  Problem Relation Age of Onset  . Hypertension Mother   . Diabetes Mother   . Lung cancer Father   . Stroke Father   . Diabetes Sister   . Hypertension Sister     [facility] Social History   Tobacco Use  . Smoking status: Former Smoker    Packs/day: 0.25    Types: Cigarettes    Last attempt to quit: 1997    Years since quitting: 22.1  . Smokeless tobacco: Never Used  Substance Use Topics  . Alcohol use: Yes    Alcohol/week: 0.0 oz    Comment: occ  . Drug use: No    Review of Systems Review of systems negative except for pertinent positives and negatives in history of present illness above.     Objective:     Vitals:   07/08/17 0834  BP: 112/76  Pulse: 73  Temp: 98.5 F (36.9 C)  TempSrc: Oral  SpO2: 96%  Weight: 240 lb (108.9 kg)  Height: _0  (1.651 m)   Body mass index is 39.94 kg/m.  Physical Exam  GEN: appears well, no apparent distress. CVS: RRR, nl s1 & s2, no murmurs, no edema RESP: no IWOB, good air movement bilaterally, CTAB GI: BS present & normal, soft, NTND, no notable ascites, no HSM MSK:  LLE:  Skin erythema resolved.  Appears the same size of the right leg.  Mild tenderness to palpation in a calf.  No increased warmth to touch.  Full range of motion  in her knee.  No signs of Baker's cyst.  Neurovascular intact. Left calf circumference 42.5 cm.  Right calf circumference 42 cm   SKIN: no apparent skin lesion NEURO: alert and oiented appropriately, no gross deficits     Assessment and Plan:  1. Cellulitis of leg, left: Improved but she is not adherent with her medication.  She missed 6 doses so far.  She was discharged with 20 capsules about 4 days ago.  She is still have pain capsules left in her bottle.  We discussed the importance of  taking her medication as prescribed.  She is still have some pain and tenderness to palpation.  She has no constitutional symptoms.  No notable erythema.  No increased warmth to touch.  She had recent LLE DVT Doppler which was negative.  No cardiopulmonary symptoms.  I recommended taking Tylenol around-the-clock for pain.  She also have tramadol as needed.  Suggested applying ice 2-3 times a day.  We also discussed the importance of mobilization.  Follow-up as needed.  Discussed return precautions.  2. Cirrhosis of liver without ascites, unspecified hepatic cirrhosis type [facility]): Recent abdominal ultrasound on 06/15/2017 with coarse heterogeneous nodular liver concerning for hepatocellular disease/suspected cirrhosis, bidirectional flow in portal vein suggesting portal hypertension, and splenomegaly.  Abdominal exam without ascites.  I could not appreciate HSM on my exam. - Ambulatory referral to Gastroenterology  3. Other microscopic hematuria:  -Follow-up with PCP  Return if symptoms worsen or fail to improve.  Mercy Riding, MD 07/08/17 Pager: [phone]

## 2017-07-19 ENCOUNTER — Other Ambulatory Visit: Payer: Self-pay | Admitting: Student

## 2017-07-19 ENCOUNTER — Ambulatory Visit: Payer: Medicaid Other | Admitting: Student

## 2017-07-19 DIAGNOSIS — K219 Gastro-esophageal reflux disease without esophagitis: Secondary | ICD-10-CM

## 2017-07-19 MED FILL — metFORMIN HCL 500 MG TABS: 500 | 90 days supply | Qty: 180 | Fill #0

## 2017-07-19 MED FILL — PANTOPRAZOLE SOD DR 40 MG T: 40 | 30 days supply | Qty: 30 | Fill #0

## 2017-07-20 ENCOUNTER — Inpatient Hospital Stay: Payer: Medicaid Other

## 2017-07-20 ENCOUNTER — Inpatient Hospital Stay: Payer: Medicaid Other | Attending: Hematology and Oncology | Admitting: Hematology and Oncology

## 2017-07-20 ENCOUNTER — Telehealth: Payer: Self-pay | Admitting: Hematology and Oncology

## 2017-07-20 ENCOUNTER — Encounter: Payer: Self-pay | Admitting: Hematology and Oncology

## 2017-07-20 ENCOUNTER — Other Ambulatory Visit: Payer: Self-pay

## 2017-07-20 VITALS — BP 136/80 | HR 85 | Temp 98.5°F | Resp 17 | Ht 65.0 in | Wt 240.2 lb

## 2017-07-20 DIAGNOSIS — K7460 Unspecified cirrhosis of liver: Secondary | ICD-10-CM

## 2017-07-20 DIAGNOSIS — D696 Thrombocytopenia, unspecified: Secondary | ICD-10-CM

## 2017-07-20 DIAGNOSIS — D72819 Decreased white blood cell count, unspecified: Secondary | ICD-10-CM

## 2017-07-20 DIAGNOSIS — D61818 Other pancytopenia: Secondary | ICD-10-CM | POA: Diagnosis not present

## 2017-07-20 LAB — RETICULOCYTES
RBC.: 3.91 MIL/uL (ref 3.70–5.45)
RETIC COUNT ABSOLUTE: 156.4 10*3/uL — AB (ref 33.7–90.7)
Retic Ct Pct: 4 % — ABNORMAL HIGH (ref 0.7–2.1)

## 2017-07-20 LAB — CBC WITH DIFFERENTIAL (CANCER CENTER ONLY)
BASOS ABS: 0 10*3/uL (ref 0.0–0.1)
BASOS PCT: 2 %
Eosinophils Absolute: 0 10*3/uL (ref 0.0–0.5)
Eosinophils Relative: 3 %
HEMATOCRIT: 34.9 % (ref 34.8–46.6)
HEMOGLOBIN: 11.6 g/dL (ref 11.6–15.9)
LYMPHS PCT: 28 %
Lymphs Abs: 0.4 10*3/uL — ABNORMAL LOW (ref 0.9–3.3)
MCH: 29.7 pg (ref 25.1–34.0)
MCHC: 33.2 g/dL (ref 31.5–36.0)
MCV: 89.3 fL (ref 79.5–101.0)
MONO ABS: 0.2 10*3/uL (ref 0.1–0.9)
Monocytes Relative: 11 %
NEUTROS ABS: 0.9 10*3/uL — AB (ref 1.5–6.5)
NEUTROS PCT: 56 %
Platelet Count: 79 10*3/uL — ABNORMAL LOW (ref 145–400)
RBC: 3.91 MIL/uL (ref 3.70–5.45)
RDW: 15.1 % — AB (ref 11.2–14.5)
WBC Count: 1.5 10*3/uL — ABNORMAL LOW (ref 3.9–10.3)

## 2017-07-20 LAB — COMPREHENSIVE METABOLIC PANEL
ALBUMIN: 2.8 g/dL — AB (ref 3.5–5.0)
ALT: 46 U/L (ref 0–55)
ANION GAP: 6 (ref 3–11)
AST: 71 U/L — ABNORMAL HIGH (ref 5–34)
Alkaline Phosphatase: 188 U/L — ABNORMAL HIGH (ref 40–150)
BILIRUBIN TOTAL: 1.9 mg/dL — AB (ref 0.2–1.2)
BUN: 7 mg/dL (ref 7–26)
CALCIUM: 9.4 mg/dL (ref 8.4–10.4)
CO2: 24 mmol/L (ref 22–29)
Chloride: 104 mmol/L (ref 98–109)
Creatinine, Ser: 0.91 mg/dL (ref 0.60–1.10)
GFR calc non Af Amer: >60 mL/min (ref >60)
GLUCOSE: 383 mg/dL — AB (ref 70–140)
POTASSIUM: 4.2 mmol/L (ref 3.5–5.1)
SODIUM: 134 mmol/L — AB (ref 136–145)
TOTAL PROTEIN: 8.1 g/dL (ref 6.4–8.3)

## 2017-07-20 NOTE — Telephone Encounter (Signed)
Scheduled appt per 3/12 los - Gave patient AVS and calender per los.

## 2017-07-28 ENCOUNTER — Ambulatory Visit: Payer: Medicaid Other | Admitting: Family Medicine

## 2017-07-28 NOTE — Progress Notes (Signed)
[facility] Follow-up Visit:  Assessment: Other pancytopenia (Holly Mueller) 52 year old female referred to our clinic for evaluation of bicytopenia with leukopenia and thrombocytopenia. Review of the trends of the white blood cell counts reveal neutropenia and lymphopenia which have developed gradually since 2014. Process is chronic and reasonably stable with no significant change in the blood counts from October 2017 to in June 2018. No basophilia or monocytosis at this time. No evidence of immature forms presence historically. Neutropenia at this time is moderate. Has no evidence of recurrent infections history and reports.  Due to the degree of cytopenia, bone marrow biopsy was obtained demonstrating no evidence of myelodysplasia or leukemia based on morphology. Molecular and cytogenetic studies are negative for a clonal process presence.  Considering presence of liver cirrhosis, I do suspect that the blood count abnormalities are related to hepatic dysfunction.  It is possible that patient is using alcohol and is not disclosing that, but I do not have any evidence to support.  At this time, I believe the focus needs to be on establishing the cause of the hepatic dysfunction  Plan: --Awaiting assessment by gastroenterology for the etiologies of liver cirrhosis --We will continue hematological monitoring. -Return to clinic in 4 months: Labs, clinic visit.     Orders Placed This Encounter  Procedures  . CBC with Differential (Cancer Center Only)    Standing Status:   Future    Standing Expiration Date:   07/21/2018  . CMP ([facility] only)    Standing Status:   Future    Standing Expiration Date:   07/21/2018   All questions were answered.  . The patient knows to call the clinic with any problems, questions or concerns.  This note was electronically signed.    History of Presenting Illness Holly Mueller 52 y.o. followed in the [facility] for evaluation of  bicytopenia. See hematological history below for the details of labwork todate. The present time, patient mainly complains of shortness of breath that she attributes to asthma diagnosis. She also complains of low energy. Patient denies recurrent infections, denies fevers, chills, night sweats. No unexpected weight change. She denies any significant changes to her appetite or activity tolerance changes that are not related to shortness of breath. She denies nausea, vomiting, abdominal pain, diarrhea, or constipation. Denies dysuria or hematuria. Denies any swelling in the neck, armpits, or groin. Denies any skin rashes or easy bruisability.  Patient denies any history of alcohol abuse. She does not drink at the present time at all, and previously only has been occasional and social drinker. She denies any history of IV drug use, mushroom abuse, or spider bites. Denies previous exposure to significant amounts of solvents or other chemicals other than the usual use of cleaning supplies at work and at home.  Additional review of the history reveals hospitalization in February 2014. During that time, and was diagnosed with an incarcerated umbilical hernia and incarcerated ventral hernia and she underwent surgical repair for both situations with liver biopsies obtained demonstrating hepatic cirrhosis at that time.  Patient returns to the clinic for continued hematological monitoring.  Of note, patient was supposed to return to our clinic in September 2018, but was lost to follow-up for unclear reasons.  Patient denies any new complaints since the last visit to the clinic.     Oncological/hematological History: --Liver Bx, 06/30/12: Fibrosis with chronic and active inflammation consistent with cirrhosis. --Labs, 04/25/13: WBC 4.2, ANC ..., ALC ..., Hgb 13.2, MCV 85.9,  MCH 30.1, Plt 164; --Labs, 07/13/13: WBC 3.2, ANC ..., ALC ..., Hgb 12.1, MCV 86.1, MCH 30.0, Plt 115; --EGD, Jun 2015: Positive for  Schatzki's ring, his esophagitis, rhinitis, negative for H. pylori, biopsy is negative for eosinophilic infiltrates. --Labs, 02/08/14: WBC 2.1, ANC ..., ALC ..., Hgb 11.7, MCV 86.2, MCH 29.4, Plt 105; --Labs, 03/06/16: WBC 1.6, ANC 0.8, ALC 0.6, Baso 1.7%, Hgb 12.5, MCV 85.2, MCH ..., Plt 78 --Labs, 10/23/16: WBC 1.7, ANC 0.9, ALC 0.6, Baso 2.0%, Hgb 12.7, MCV 88.0, MCH 29.7, Plt 73; --Labs, 11/17/16: WBC 1.7, ANC 0.9, ALC 0.6, Baso 0.0, Hgb 12.5, Retic 3.98%(up), MCV 86.8, MCH 30.0, Plt 86; tBili 2.4, tProt 8.4, Alb 3.3, AP 148, AST 61, ALT 46; ESR 18(1nl), CRP 7.2(up), LDH 258, ANA negative, C376(down), C4 12(down); SPEP -- no evidence of monoclonal protein presence; Cu 122(wnl), heavy metals -- neagtive; TSH 0.081; negative for active infection with CMV, EBV, hepatitis B, hepatitis C, HIV. --BM Bx, 12/02/16: Hypercellular bone marrow without evidence of dyspoiesis and no elevation of blast count. CytoGen -- 46,XX no significant abnormal findings --Labs,07/20/17: WBC 1.5, ANC 0.9, ALC 0.4, Mono 0.2, Hgb 11.6, Retic 4.0%, aRetic 156.4, Plt 79;    Medical History: Past Medical History:  Diagnosis Date  . Arthritis    bursitis left hip flares-not an issue  . Asthma 2004  . Edentulous    10-19-13 at present  . Epilepsy ([place]) in 1972   No seizures since 1972. Previously treated with phenobarbital.   . Fever 06/2017  . GERD (gastroesophageal reflux disease) 2008  . Headache(784.0)    hx of migraines   . Hypothyroidism 2008  . Lower esophageal ring 08/18/2013  . Seasonal allergies 2003  . Shortness of breath     Surgical History: Past Surgical History:  Procedure Laterality Date  . BALLOON DILATION N/A 10/24/2013   Procedure: BALLOON DILATION;  Surgeon: Gatha Mayer, MD;  Location: [facility];  Service: Endoscopy;  Laterality: N/A;  . CESAREAN SECTION     x2  . DENTAL SURGERY     multiple extractions 3'15  . ESOPHAGOGASTRODUODENOSCOPY N/A 10/24/2013   Procedure:  ESOPHAGOGASTRODUODENOSCOPY (EGD);  Surgeon: Gatha Mayer, MD;  Location: [facility];  Service: Endoscopy;  Laterality: N/A;  . LIVER BIOPSY  06/30/2012   Procedure: LIVER BIOPSY;  Surgeon: Shann Medal, MD;  Location: [facility];  Service: General;;  . NOVASURE ABLATION    . SHOULDER ARTHROSCOPY Right 2011  . UMBILICAL HERNIA REPAIR N/A 06/30/2012   Procedure: remove umbilicus;  Surgeon: Shann Medal, MD;  Location: [facility];  Service: General;  Laterality: N/A;  . VENTRAL HERNIA REPAIR N/A 06/30/2012   Procedure: LAPAROSCOPIC VENTRAL HERNIA;  Surgeon: Shann Medal, MD;  Location: [facility];  Service: General;  Laterality: N/A;  With Mesh  . WISDOM TOOTH EXTRACTION      Family History: Family History  Problem Relation Age of Onset  . Hypertension Mother   . Diabetes Mother   . Lung cancer Father   . Stroke Father   . Diabetes Sister   . Hypertension Sister     Social History: Social History   Socioeconomic History  . Marital status: Single    Spouse name: Not on file  . Number of children: 2  . Years of education: Not on file  . Highest education level: Not on file  Social Needs  . Financial resource strain: Not on file  . Food insecurity - worry: Not on file  .  Food insecurity - inability: Not on file  . Transportation needs - medical: Not on file  . Transportation needs - non-medical: Not on file  Occupational History    Employer: [employer]  Tobacco Use  . Smoking status: Former Smoker    Packs/day: 0.25    Types: Cigarettes    Last attempt to quit: 1997    Years since quitting: 22.2  . Smokeless tobacco: Never Used  Substance and Sexual Activity  . Alcohol use: Yes    Alcohol/week: 0.0 oz    Comment: occ  . Drug use: No  . Sexual activity: No  Other Topics Concern  . Not on file  Social History Narrative   Works in Estate manager/land agent at [employer]    Lives with 12 and 14 (boys).    Also living with close friends of the family (mother and two kids,  adults).           Allergies: Allergies  Allergen Reactions  . Aspirin Nausea Only    Upset stomach    Medications:  Current Outpatient Medications  Medication Sig Dispense Refill  . albuterol (PROAIR HFA) 108 (90 Base) MCG/ACT inhaler INHALE 2 PUFFS INTO THE LUNGS EVERY 6 HOURS AS NEEDED FOR SHORTNESS OF BREATH 8.5 g 5  . albuterol (PROVENTIL) (2.5 MG/3ML) 0.083% nebulizer solution Take 3 mLs (2.5 mg total) by nebulization every 6 (six) hours as needed for wheezing or shortness of breath. 75 mL 2  . benzonatate (TESSALON) 100 MG capsule Take 1 capsule (100 mg total) by mouth 3 (three) times daily as needed for cough. 40 capsule 0  . blood glucose meter kit and supplies KIT Dispense based on patient and insurance preference. Use up to four times daily as directed. (FOR ICD-9 250.00, 250.01). 1 each 0  . budesonide-formoterol (SYMBICORT) 160-4.5 MCG/ACT inhaler Inhale 2 puffs into the lungs 2 (two) times daily. 1 Inhaler 12  . cetirizine (ZYRTEC) 10 MG tablet Take 1 tablet (10 mg total) by mouth daily. 30 tablet 11  . fluticasone (FLONASE) 50 MCG/ACT nasal spray Place 2 sprays into both nostrils daily.    . hydrocortisone 2.5 % ointment Apply topically 2 (two) times daily. 30 g 0  . levothyroxine (SYNTHROID, LEVOTHROID) 125 MCG tablet Take 2 tablets (250 mcg total) by mouth daily. 60 tablet 1  . metFORMIN (GLUCOPHAGE) 500 MG tablet Take 1 tablet (500 mg total) by mouth 2 (two) times daily with a meal. 180 tablet 0  . pantoprazole (PROTONIX) 40 MG tablet Take 1 tablet (40 mg total) by mouth daily. 30 tablet 3  . pantoprazole (PROTONIX) 40 MG tablet TAKE 1 TABLET BY MOUTH DAILY. 30 tablet 3  . promethazine-dextromethorphan (PROMETHAZINE-DM) 6.25-15 MG/5ML syrup Take 5 mLs by mouth 4 (four) times daily as needed for cough. 120 mL 0  . traMADol (ULTRAM) 50 MG tablet Take 1 tablet (50 mg total) by mouth every 12 (twelve) hours as needed for moderate pain or severe pain. 10 tablet 0   No  current facility-administered medications for this visit.     Review of Systems: Review of Systems  All other systems reviewed and are negative.    PHYSICAL EXAMINATION Blood pressure 136/80, pulse 85, temperature 98.5 F (36.9 C), temperature source Oral, resp. rate 17, height _0  (1.651 m), weight 240 lb 3.2 oz (109 kg), SpO2 94 %.  ECOG PERFORMANCE STATUS: 1 - Symptomatic but completely ambulatory  Physical Exam  Constitutional: She is oriented to person, place, and time and  well-developed, well-nourished, and in no distress. Vital signs are normal. She appears not lethargic, not malnourished and not jaundiced. She appears not cachectic.  Non-toxic appearance. She does not have a sickly appearance. No distress.  Morbidly obese female  HENT:  Head: Normocephalic and atraumatic.  Mouth/Throat: She does not have dentures. Normal dentition. No oropharyngeal exudate.    Eyes: Conjunctivae and EOM are normal. Pupils are equal, round, and reactive to light. Right eye exhibits no chemosis and no exudate. Left eye exhibits no chemosis and no exudate. Right conjunctiva has no hemorrhage. Left conjunctiva has no hemorrhage. No scleral icterus.  Neck: No JVD present. No thyroid mass and no thyromegaly present.  Cardiovascular: Normal rate, regular rhythm, S1 normal and S2 normal. Exam reveals no gallop.  No murmur heard. Pulmonary/Chest: Effort normal. She has no decreased breath sounds. She has no wheezes.  Coarse breath sounds diffusely with bilateral lungs. No crackles.  Abdominal: Soft. Normal appearance. There is no tenderness. There is no CVA tenderness.  Extensive pannus precluding accurate examination of the liver and spleen. No hepatosplenomegaly by percussion.  Lymphadenopathy:       Head (right side): No submandibular and no occipital adenopathy present.       Head (left side): No submandibular and no occipital adenopathy present.    She has no cervical adenopathy.    She has  no axillary adenopathy.       Right: No inguinal and no supraclavicular adenopathy present.       Left: No inguinal and no supraclavicular adenopathy present.  Neurological: She is oriented to person, place, and time. She appears not lethargic.  Nonfocal neurological examination with apparent preserved and symmetric strength and sensation.  Skin: She is not diaphoretic.  No petechiae, ecchymosis, or rash. No jaundice.     LABORATORY DATA: I have personally reviewed the data as listed: Appointment on 07/20/2017  Component Date Value Ref Range Status  . Sodium 07/20/2017 134* 136 - 145 mmol/L Final  . Potassium 07/20/2017 4.2  3.5 - 5.1 mmol/L Final  . Chloride 07/20/2017 104  98 - 109 mmol/L Final  . CO2 07/20/2017 24  22 - 29 mmol/L Final  . Glucose, Bld 07/20/2017 383* 70 - 140 mg/dL Final  . BUN 07/20/2017 7  7 - 26 mg/dL Final  . Creatinine, Ser 07/20/2017 0.91  0.60 - 1.10 mg/dL Final  . Calcium 07/20/2017 9.4  8.4 - 10.4 mg/dL Final  . Total Protein 07/20/2017 8.1  6.4 - 8.3 g/dL Final  . Albumin 07/20/2017 2.8* 3.5 - 5.0 g/dL Final  . AST 07/20/2017 71* 5 - 34 U/L Final  . ALT 07/20/2017 46  0 - 55 U/L Final  . Alkaline Phosphatase 07/20/2017 188* 40 - 150 U/L Final  . Total Bilirubin 07/20/2017 1.9* 0.2 - 1.2 mg/dL Final  . GFR calc non Af Amer 07/20/2017 >60  >60 mL/min Final  . GFR calc Af Amer 07/20/2017 >60  >60 mL/min Final   Comment: (NOTE) The eGFR has been calculated using the CKD EPI equation. This calculation has not been validated in all clinical situations. eGFR's persistently <60 mL/min signify possible Chronic Kidney Disease.   Georgiann Hahn gap 07/20/2017 6  3 - 11 Final   Performed at [facility], [place] [address], [place], [place] [address]  . WBC Count 07/20/2017 1.5* 3.9 - 10.3 K/uL Final  . RBC 07/20/2017 3.91  3.70 - 5.45 MIL/uL Final  . Hemoglobin 07/20/2017 11.6  11.6 - 15.9  g/dL Final  . HCT 07/20/2017 34.9  34.8 - 46.6 % Final    . MCV 07/20/2017 89.3  79.5 - 101.0 fL Final  . MCH 07/20/2017 29.7  25.1 - 34.0 pg Final  . MCHC 07/20/2017 33.2  31.5 - 36.0 g/dL Final  . RDW 07/20/2017 15.1* 11.2 - 14.5 % Final  . Platelet Count 07/20/2017 79* 145 - 400 K/uL Final  . Neutrophils Relative % 07/20/2017 56  % Final  . Neutro Abs 07/20/2017 0.9* 1.5 - 6.5 K/uL Final  . Lymphocytes Relative 07/20/2017 28  % Final  . Lymphs Abs 07/20/2017 0.4* 0.9 - 3.3 K/uL Final  . Monocytes Relative 07/20/2017 11  % Final  . Monocytes Absolute 07/20/2017 0.2  0.1 - 0.9 K/uL Final  . Eosinophils Relative 07/20/2017 3  % Final  . Eosinophils Absolute 07/20/2017 0.0  0.0 - 0.5 K/uL Final  . Basophils Relative 07/20/2017 2  % Final  . Basophils Absolute 07/20/2017 0.0  0.0 - 0.1 K/uL Final   Performed at [facility], [place] [address] [place], [place] [address]  . Retic Ct Pct 07/20/2017 4.0* 0.7 - 2.1 % Final  . RBC. 07/20/2017 3.91  3.70 - 5.45 MIL/uL Final  . Retic Count, Absolute 07/20/2017 156.4* 33.7 - 90.7 K/uL Final   Performed at [facility], [place] [address] [place], [place] [address]       Ardath Sax, MD

## 2017-07-28 NOTE — Assessment & Plan Note (Signed)
52-year-old female referred to our clinic for evaluation of bicytopenia with leukopenia and thrombocytopenia. Review of the trends of the white blood cell counts reveal neutropenia and lymphopenia which have developed gradually since 2014. Process is chronic and reasonably stable with no significant change in the blood counts from October 2017 to in June 2018. No basophilia or monocytosis at this time. No evidence of immature forms presence historically. Neutropenia at this time is moderate. Has no evidence of recurrent infections history and reports.  Due to the degree of cytopenia, bone marrow biopsy was obtained demonstrating no evidence of myelodysplasia or leukemia based on morphology. Molecular and cytogenetic studies are negative for a clonal process presence.  Considering presence of liver cirrhosis, I do suspect that the blood count abnormalities are related to hepatic dysfunction.  It is possible that patient is using alcohol and is not disclosing that, but I do not have any evidence to support.  At this time, I believe the focus needs to be on establishing the cause of the hepatic dysfunction  Plan: --Awaiting assessment by gastroenterology for the etiologies of liver cirrhosis --We will continue hematological monitoring. -Return to clinic in 4 months: Labs, clinic visit.   

## 2017-08-04 ENCOUNTER — Emergency Department (HOSPITAL_COMMUNITY)
Admission: EM | Admit: 2017-08-04 | Discharge: 2017-08-04 | Disposition: A | Discharge status: Home / Self Care | Payer: Medicaid Other | Attending: Emergency Medicine | Admitting: Emergency Medicine

## 2017-08-04 ENCOUNTER — Encounter (HOSPITAL_COMMUNITY): Payer: Self-pay | Admitting: *Deleted

## 2017-08-04 ENCOUNTER — Emergency Department (HOSPITAL_COMMUNITY): Payer: Medicaid Other

## 2017-08-04 ENCOUNTER — Other Ambulatory Visit: Payer: Self-pay

## 2017-08-04 DIAGNOSIS — Z7984 Long term (current) use of oral hypoglycemic drugs: Secondary | ICD-10-CM | POA: Diagnosis not present

## 2017-08-04 DIAGNOSIS — J189 Pneumonia, unspecified organism: Secondary | ICD-10-CM | POA: Diagnosis not present

## 2017-08-04 DIAGNOSIS — R05 Cough: Secondary | ICD-10-CM | POA: Diagnosis present

## 2017-08-04 DIAGNOSIS — E119 Type 2 diabetes mellitus without complications: Secondary | ICD-10-CM | POA: Diagnosis not present

## 2017-08-04 DIAGNOSIS — R918 Other nonspecific abnormal finding of lung field: Secondary | ICD-10-CM | POA: Diagnosis not present

## 2017-08-04 DIAGNOSIS — J45909 Unspecified asthma, uncomplicated: Secondary | ICD-10-CM | POA: Diagnosis not present

## 2017-08-04 DIAGNOSIS — E039 Hypothyroidism, unspecified: Secondary | ICD-10-CM | POA: Insufficient documentation

## 2017-08-04 DIAGNOSIS — R9389 Abnormal findings on diagnostic imaging of other specified body structures: Secondary | ICD-10-CM

## 2017-08-04 DIAGNOSIS — Z79899 Other long term (current) drug therapy: Secondary | ICD-10-CM | POA: Diagnosis not present

## 2017-08-04 LAB — CBC WITH DIFFERENTIAL/PLATELET
Basophils Absolute: 0 10*3/uL (ref 0.0–0.1)
Basophils Relative: 2 %
EOS ABS: 0.1 10*3/uL (ref 0.0–0.7)
EOS PCT: 4 %
HCT: 34 % — ABNORMAL LOW (ref 36.0–46.0)
Hemoglobin: 11.5 g/dL — ABNORMAL LOW (ref 12.0–15.0)
LYMPHS ABS: 0.5 10*3/uL — AB (ref 0.7–4.0)
Lymphocytes Relative: 25 %
MCH: 29.9 pg (ref 26.0–34.0)
MCHC: 33.8 g/dL (ref 30.0–36.0)
MCV: 88.3 fL (ref 78.0–100.0)
MONO ABS: 0.2 10*3/uL (ref 0.1–1.0)
MONOS PCT: 10 %
Neutro Abs: 1.2 10*3/uL — ABNORMAL LOW (ref 1.7–7.7)
Neutrophils Relative %: 59 %
PLATELETS: DECREASED 10*3/uL (ref 150–400)
RBC: 3.85 MIL/uL — AB (ref 3.87–5.11)
RDW: 14.9 % (ref 11.5–15.5)
WBC: 2 10*3/uL — ABNORMAL LOW (ref 4.0–10.5)

## 2017-08-04 LAB — BASIC METABOLIC PANEL
Anion gap: 8 (ref 5–15)
BUN: 5 mg/dL — ABNORMAL LOW (ref 6–20)
CALCIUM: 8.9 mg/dL (ref 8.9–10.3)
CO2: 22 mmol/L (ref 22–32)
CREATININE: 0.61 mg/dL (ref 0.44–1.00)
Chloride: 107 mmol/L (ref 101–111)
GFR calc Af Amer: >60 mL/min (ref >60)
GLUCOSE: 92 mg/dL (ref 65–99)
Potassium: 3.5 mmol/L (ref 3.5–5.1)
SODIUM: 137 mmol/L (ref 135–145)

## 2017-08-04 MED ORDER — AMOXICILLIN-POT CLAVULANATE 875-125 MG PO TABS: 1.0000 | ORAL_TABLET | Freq: Two times a day (BID) | ORAL | 0 refills | Status: DC

## 2017-08-04 MED ORDER — SODIUM CHLORIDE 0.9 % IV BOLUS: 1000.0000 mL | Freq: Once | INTRAVENOUS | Status: DC

## 2017-08-04 MED ORDER — AMOXICILLIN-POT CLAVULANATE 875-125 MG PO TABS
1.0000 | ORAL_TABLET | Freq: Once | ORAL | Status: AC
  Administered 2017-08-04: 1 via ORAL
  Filled 2017-08-04: qty 1

## 2017-08-04 MED ORDER — SODIUM CHLORIDE 0.9 % IV SOLN: 1.0000 g | Freq: Once | INTRAVENOUS | Status: DC

## 2017-08-04 MED ORDER — AZITHROMYCIN 250 MG PO TABS: 500.0000 mg | ORAL_TABLET | Freq: Once | ORAL | Status: DC

## 2017-08-04 MED ORDER — ALBUTEROL SULFATE (2.5 MG/3ML) 0.083% IN NEBU: 5.0000 mg | INHALATION_SOLUTION | Freq: Once | RESPIRATORY_TRACT | Status: DC

## 2017-08-04 NOTE — ED Triage Notes (Signed)
Pt arrived by [facility]. Pt reported cough and flu symptoms for several days but more severe since last night. Has hx of asthma and reports productive cough and wheezing. Reports sputum is yellow/green. Has chest wall pain when breathing and coughing. Denies fever. No acute distress is noted at triage.

## 2017-08-04 NOTE — ED Provider Notes (Signed)
[facility] EMERGENCY DEPARTMENT Provider Note   CSN: [id number] Arrival date & time: 08/04/17  1504     History   Chief Complaint Chief Complaint  Patient presents with  . Cough  . Shortness of Breath    HPI Holly Mueller is a 52 y.o. female.  Resents for evaluation of illness for 1 week with cough, sputum production, weakness, malaise, decreased appetite, and shortness of breath.  She has restarted using her Symbicort and albuterol inhalers, during this illness.  Previously she had been told to stop them because she does not actually have asthma.  No known sick contacts.  She had influenza earlier this year and was hospitalized at the time February.  No current known sick contacts although she works in a [employer].  She does not smoke cigarettes.  There are no other known modifying factors.  HPI  Past Medical History:  Diagnosis Date  . Arthritis    bursitis left hip flares-not an issue  . Asthma 2004  . Edentulous    10-19-13 at present  . Epilepsy ([place]) in 1972   No seizures since 1972. Previously treated with phenobarbital.   . Fever 06/2017  . GERD (gastroesophageal reflux disease) 2008  . Headache(784.0)    hx of migraines   . Hypothyroidism 2008  . Lower esophageal ring 08/18/2013  . Seasonal allergies 2003  . Shortness of breath     Patient Active Problem List   Diagnosis Date Noted  . Cellulitis of left lower extremity   . Pulmonary edema 06/30/2017  . Left leg pain   . Type 2 diabetes mellitus without complication, without long-term current use of insulin ([place])   . Bibasilar crackles   . Influenza-like illness   . Influenza A   . Neutropenic fever ([place]) 06/14/2017  . Cirrhosis of liver without ascites ([place]) 11/17/2016  . Thrombocytopenia ([place]) 11/17/2016  . Other pancytopenia ([place]) 11/17/2016  . Bicytopenia 10/24/2016  . Epistaxis 10/23/2016  . GERD (gastroesophageal reflux disease) 10/23/2016  . Cough 12/09/2015  . Chronic  pain of left thumb 10/09/2015  . Precordial chest pain 08/03/2015  . Abscess of skin of abdomen 07/10/2014  . Bursitis of left hip 09/14/2013  . Lower esophageal ring 08/18/2013  . Dyspnea 08/17/2013  . Ganglion cyst of wrist 08/24/2012  . Hip pain, bilateral 08/23/2012  . Umbilical hernia 63/05/6008  . Healthcare maintenance 02/14/2012  . Osteoarthritis 02/12/2012  . Elevated LFTs 10/30/2011  . Allergic rhinitis 08/26/2011  . Obesity 07/08/2010  . SEIZURES, HX OF 12/12/2009  . DENTAL CARIES 12/18/2008  . DEPRESSION 07/26/2008  . MIGRAINE HEADACHE 11/15/2007  . Hypothyroidism 10/12/2006  . Asthma 07/30/2006    Past Surgical History:  Procedure Laterality Date  . BALLOON DILATION N/A 10/24/2013   Procedure: BALLOON DILATION;  Surgeon: Gatha Mayer, MD;  Location: [facility];  Service: Endoscopy;  Laterality: N/A;  . CESAREAN SECTION     x2  . DENTAL SURGERY     multiple extractions 3'15  . ESOPHAGOGASTRODUODENOSCOPY N/A 10/24/2013   Procedure: ESOPHAGOGASTRODUODENOSCOPY (EGD);  Surgeon: Gatha Mayer, MD;  Location: [facility];  Service: Endoscopy;  Laterality: N/A;  . LIVER BIOPSY  06/30/2012   Procedure: LIVER BIOPSY;  Surgeon: Shann Medal, MD;  Location: [facility] ORS;  Service: General;;  . NOVASURE ABLATION    . SHOULDER ARTHROSCOPY Right 2011  . UMBILICAL HERNIA REPAIR N/A 06/30/2012   Procedure: remove umbilicus;  Surgeon: Shann Medal, MD;  Location: [facility];  Service: General;  Laterality: N/A;  . VENTRAL HERNIA REPAIR N/A 06/30/2012   Procedure: LAPAROSCOPIC VENTRAL HERNIA;  Surgeon: Shann Medal, MD;  Location: [facility];  Service: General;  Laterality: N/A;  With Mesh  . WISDOM TOOTH EXTRACTION       OB History   None      Home Medications    Prior to Admission medications   Medication Sig Start Date End Date Taking? Authorizing Provider  albuterol (PROAIR HFA) 108 (90 Base) MCG/ACT inhaler INHALE 2 PUFFS INTO THE LUNGS EVERY 6 HOURS AS NEEDED FOR  SHORTNESS OF BREATH 10/23/16   Mercy Riding, MD  albuterol (PROVENTIL) (2.5 MG/3ML) 0.083% nebulizer solution Take 3 mLs (2.5 mg total) by nebulization every 6 (six) hours as needed for wheezing or shortness of breath. 10/23/16   Mercy Riding, MD  amoxicillin-clavulanate (AUGMENTIN) 875-125 MG tablet Take 1 tablet by mouth 2 (two) times daily. One po bid x 7 days 08/04/17   Daleen Bo, MD  benzonatate (TESSALON) 100 MG capsule Take 1 capsule (100 mg total) by mouth 3 (three) times daily as needed for cough. 06/19/17   Rory Percy, DO  blood glucose meter kit and supplies KIT Dispense based on patient and insurance preference. Use up to four times daily as directed. (FOR ICD-9 250.00, 250.01). 06/19/17   Rory Percy, DO  budesonide-formoterol (SYMBICORT) 160-4.5 MCG/ACT inhaler Inhale 2 puffs into the lungs 2 (two) times daily. 04/05/17   Tanna Furry, MD  cetirizine (ZYRTEC) 10 MG tablet Take 1 tablet (10 mg total) by mouth daily. 01/27/17   [name], MD  fluticasone (FLONASE) 50 MCG/ACT nasal spray Place 2 sprays into both nostrils daily.    [provider]  hydrocortisone 2.5 % ointment Apply topically 2 (two) times daily. 06/24/17   Nicolette Bang, DO  levothyroxine (SYNTHROID, LEVOTHROID) 125 MCG tablet Take 2 tablets (250 mcg total) by mouth daily. 03/11/17   [name], MD  metFORMIN (GLUCOPHAGE) 500 MG tablet Take 1 tablet (500 mg total) by mouth 2 (two) times daily with a meal. 06/24/17   Nicolette Bang, DO  pantoprazole (PROTONIX) 40 MG tablet Take 1 tablet (40 mg total) by mouth daily. 03/11/17   [name], MD  pantoprazole (PROTONIX) 40 MG tablet TAKE 1 TABLET BY MOUTH DAILY. 07/20/17   [name], MD  promethazine-dextromethorphan (PROMETHAZINE-DM) 6.25-15 MG/5ML syrup Take 5 mLs by mouth 4 (four) times daily as needed for cough. 06/19/17   Rory Percy, DO  traMADol (ULTRAM) 50 MG tablet Take 1 tablet (50 mg total) by  mouth every 12 (twelve) hours as needed for moderate pain or severe pain. 07/04/17   Bonnita Hollow, MD    Family History Family History  Problem Relation Age of Onset  . Hypertension Mother   . Diabetes Mother   . Lung cancer Father   . Stroke Father   . Diabetes Sister   . Hypertension Sister     Social History Social History   Tobacco Use  . Smoking status: Former Smoker    Packs/day: 0.25    Types: Cigarettes    Last attempt to quit: 1997    Years since quitting: 22.2  . Smokeless tobacco: Never Used  Substance Use Topics  . Alcohol use: Yes    Alcohol/week: 0.0 oz    Comment: occ  . Drug use: No     Allergies   Aspirin   Review of Systems Review of Systems  All other systems reviewed  and are negative.    Physical Exam Updated Vital Signs BP 100/65 (BP Location: Right Arm)   Pulse 77   Temp 98.2 F (36.8 C) (Oral)   Resp 18   SpO2 96%   Physical Exam  Constitutional: She is oriented to person, place, and time. She appears well-developed. She does not appear ill. No distress.  Overweight  HENT:  Head: Normocephalic and atraumatic.  Eyes: Pupils are equal, round, and reactive to light. Conjunctivae and EOM are normal.  Neck: Normal range of motion and phonation normal. Neck supple.  Cardiovascular: Normal rate and regular rhythm.  Pulmonary/Chest: Effort normal and breath sounds normal. No respiratory distress. She exhibits no tenderness.  Good airflow bilaterally with few scattered wheezes.  Abdominal: Soft. She exhibits no distension. There is no tenderness. There is no guarding.  Musculoskeletal: Normal range of motion. She exhibits no edema or deformity.  Neurological: She is alert and oriented to person, place, and time. She exhibits normal muscle tone.  Skin: Skin is warm and dry.  Psychiatric: She has a normal mood and affect. Her behavior is normal. Judgment and thought content normal.  Nursing note and vitals reviewed.    ED  Treatments / Results  Labs (all labs ordered are listed, but only abnormal results are displayed) Labs Reviewed  CBC WITH DIFFERENTIAL/PLATELET - Abnormal; Notable for the following components:      Result Value   WBC 2.0 (*)    RBC 3.85 (*)    Hemoglobin 11.5 (*)    HCT 34.0 (*)    Neutro Abs 1.2 (*)    Lymphs Abs 0.5 (*)    All other components within normal limits  BASIC METABOLIC PANEL - Abnormal; Notable for the following components:   BUN 5 (*)    All other components within normal limits    EKG None  Radiology No results found.  Procedures Procedures (including critical care time)  Medications Ordered in ED Medications  amoxicillin-clavulanate (AUGMENTIN) 875-125 MG per tablet 1 tablet (1 tablet Oral Given 08/04/17 2155)     Initial Impression / Assessment and Plan / ED Course  I have reviewed the triage vital signs and the nursing notes.  Pertinent labs & imaging results that were available during my care of the patient were reviewed by me and considered in my medical decision making (see chart for details).      No data found.  At discharge- reevaluation with update and discussion. After initial assessment and treatment, an updated evaluation reveals she is fairly comfortable and has no further complaints.  Findings discussed and questions answered. [place] decision making-the evaluation is consistent with pneumonia, unspecified cause.  Doubt sepsis, metabolic instability or impending vascular collapse.  Patient with chronically low white blood cell count.  Mildly low hemoglobin today.  Patient is amenable with safe for outpatient management.  Nursing Notes Reviewed/ Care Coordinated Applicable Imaging Reviewed Interpretation of Laboratory Data incorporated into ED treatment  The patient appears reasonably screened and/or stabilized for discharge and I doubt any other medical condition or other [facility] requiring further screening, evaluation, or  treatment in the ED at this time prior to discharge.  Plan: Home Medications-continue current medications, OTC antipyretics as needed; Home Treatments-rest, fluids; return here if the recommended treatment, does not improve the symptoms; Recommended follow up-PCP as needed   Final Clinical Impressions(s) / ED Diagnoses   Final diagnoses:  Community acquired pneumonia, unspecified laterality  Abnormal finding on chest xray  ED Discharge Orders        Ordered    amoxicillin-clavulanate (AUGMENTIN) 875-125 MG tablet  2 times daily     08/04/17 2148       Daleen Bo, MD 08/07/17 1751

## 2017-08-04 NOTE — Discharge Instructions (Addendum)
It is important to follow-up with your primary care doctor for a checkup and repeat chest x-ray imaging in 3 or 4 weeks.  Start the antibiotic prescription tomorrow morning.  Make sure that you are getting plenty of rest and drinking a lot of fluids.  Use Tylenol or Motrin if needed for pain or fever.  Use Robitussin-DM if needed for cough.

## 2017-08-05 MED FILL — AMOXICILLIN-POT CLAVULANATE: 875-125 | 7 days supply | Qty: 14 | Fill #0

## 2017-08-11 ENCOUNTER — Other Ambulatory Visit: Payer: Self-pay | Admitting: Family Medicine

## 2017-08-11 MED FILL — PROAIR HFA 90 MCG INHALER: 108 (90 BAS | 25 days supply | Qty: 9 | Fill #3

## 2017-08-11 MED FILL — SYMBICORT 160-4.5 MCG INH: 160-4.5 | 30 days supply | Qty: 10 | Fill #1

## 2017-08-11 MED FILL — ALBUTEROL 0.083% INHAL SOLN: (2.5 MG/3ML | 6 days supply | Qty: 75 | Fill #2

## 2017-08-11 MED FILL — PANTOPRAZOLE SOD DR 40 MG T: 40 | 30 days supply | Qty: 30 | Fill #1

## 2017-08-11 MED FILL — CETIRIZINE HCL 10 MG TABS: 10 | 30 days supply | Qty: 30 | Fill #2

## 2017-08-13 MED FILL — ACCU-CHEK AVIVA PLUS TEST S: 25 days supply | Qty: 100 | Fill #0

## 2017-08-13 MED FILL — ACCU-CHEK SOFTCLIX LANCETS: 25 days supply | Qty: 100 | Fill #0

## 2017-08-13 MED FILL — LEVOTHYROXINE 125 MCG TAB: 125 | 30 days supply | Qty: 60 | Fill #0

## 2017-08-13 NOTE — Telephone Encounter (Signed)
Will approve 30d refill, but patient needs to be seen to recheck thyroid. Please call and schedule with me or someone on white team in the next 30 days.

## 2017-08-13 NOTE — Telephone Encounter (Signed)
Pt informed and will call back to schedule an appointment. Tacy Dura Rumple, Mella Inclan D, CMA

## 2017-09-10 ENCOUNTER — Other Ambulatory Visit: Payer: Self-pay

## 2017-09-10 ENCOUNTER — Observation Stay (HOSPITAL_COMMUNITY)
Admission: EM | Admit: 2017-09-10 | Discharge: 2017-09-13 | Disposition: A | Discharge status: Home / Self Care | Payer: Medicaid Other | Attending: Family Medicine | Admitting: Family Medicine

## 2017-09-10 ENCOUNTER — Ambulatory Visit (INDEPENDENT_AMBULATORY_CARE_PROVIDER_SITE_OTHER): Payer: Medicaid Other | Admitting: Family Medicine

## 2017-09-10 ENCOUNTER — Ambulatory Visit (HOSPITAL_COMMUNITY)
Admission: RE | Admit: 2017-09-10 | Discharge: 2017-09-10 | Disposition: A | Discharge status: Home / Self Care | Payer: Medicaid Other | Source: Ambulatory Visit | Attending: Family Medicine | Admitting: Family Medicine

## 2017-09-10 ENCOUNTER — Encounter (HOSPITAL_COMMUNITY): Payer: Self-pay | Admitting: Emergency Medicine

## 2017-09-10 ENCOUNTER — Encounter: Payer: Self-pay | Admitting: Family Medicine

## 2017-09-10 VITALS — BP 114/62 | HR 83 | Temp 97.8°F | Ht 65.0 in | Wt 242.8 lb

## 2017-09-10 DIAGNOSIS — R079 Chest pain, unspecified: Secondary | ICD-10-CM

## 2017-09-10 DIAGNOSIS — Z87891 Personal history of nicotine dependence: Secondary | ICD-10-CM | POA: Insufficient documentation

## 2017-09-10 DIAGNOSIS — Z6838 Body mass index (BMI) 38.0-38.9, adult: Secondary | ICD-10-CM | POA: Diagnosis not present

## 2017-09-10 DIAGNOSIS — E039 Hypothyroidism, unspecified: Secondary | ICD-10-CM

## 2017-09-10 DIAGNOSIS — I110 Hypertensive heart disease with heart failure: Secondary | ICD-10-CM | POA: Diagnosis not present

## 2017-09-10 DIAGNOSIS — R0789 Other chest pain: Principal | ICD-10-CM | POA: Insufficient documentation

## 2017-09-10 DIAGNOSIS — Z886 Allergy status to analgesic agent status: Secondary | ICD-10-CM | POA: Diagnosis not present

## 2017-09-10 DIAGNOSIS — E119 Type 2 diabetes mellitus without complications: Secondary | ICD-10-CM

## 2017-09-10 DIAGNOSIS — Z23 Encounter for immunization: Secondary | ICD-10-CM | POA: Insufficient documentation

## 2017-09-10 DIAGNOSIS — G40909 Epilepsy, unspecified, not intractable, without status epilepticus: Secondary | ICD-10-CM | POA: Diagnosis not present

## 2017-09-10 DIAGNOSIS — Z79899 Other long term (current) drug therapy: Secondary | ICD-10-CM | POA: Insufficient documentation

## 2017-09-10 DIAGNOSIS — M25551 Pain in right hip: Secondary | ICD-10-CM

## 2017-09-10 DIAGNOSIS — I77819 Aortic ectasia, unspecified site: Secondary | ICD-10-CM

## 2017-09-10 DIAGNOSIS — Z7982 Long term (current) use of aspirin: Secondary | ICD-10-CM | POA: Diagnosis not present

## 2017-09-10 DIAGNOSIS — K219 Gastro-esophageal reflux disease without esophagitis: Secondary | ICD-10-CM | POA: Insufficient documentation

## 2017-09-10 DIAGNOSIS — Z794 Long term (current) use of insulin: Secondary | ICD-10-CM | POA: Diagnosis not present

## 2017-09-10 DIAGNOSIS — R5382 Chronic fatigue, unspecified: Secondary | ICD-10-CM | POA: Insufficient documentation

## 2017-09-10 DIAGNOSIS — K7460 Unspecified cirrhosis of liver: Secondary | ICD-10-CM | POA: Diagnosis not present

## 2017-09-10 DIAGNOSIS — L03116 Cellulitis of left lower limb: Secondary | ICD-10-CM | POA: Diagnosis not present

## 2017-09-10 DIAGNOSIS — F329 Major depressive disorder, single episode, unspecified: Secondary | ICD-10-CM | POA: Diagnosis not present

## 2017-09-10 DIAGNOSIS — R0602 Shortness of breath: Secondary | ICD-10-CM | POA: Insufficient documentation

## 2017-09-10 DIAGNOSIS — R2681 Unsteadiness on feet: Secondary | ICD-10-CM | POA: Diagnosis not present

## 2017-09-10 DIAGNOSIS — K743 Primary biliary cirrhosis: Secondary | ICD-10-CM | POA: Insufficient documentation

## 2017-09-10 DIAGNOSIS — I5032 Chronic diastolic (congestive) heart failure: Secondary | ICD-10-CM | POA: Diagnosis not present

## 2017-09-10 DIAGNOSIS — G43909 Migraine, unspecified, not intractable, without status migrainosus: Secondary | ICD-10-CM | POA: Insufficient documentation

## 2017-09-10 DIAGNOSIS — K766 Portal hypertension: Secondary | ICD-10-CM | POA: Diagnosis not present

## 2017-09-10 DIAGNOSIS — J984 Other disorders of lung: Secondary | ICD-10-CM | POA: Diagnosis not present

## 2017-09-10 DIAGNOSIS — R0609 Other forms of dyspnea: Secondary | ICD-10-CM

## 2017-09-10 LAB — BASIC METABOLIC PANEL
Anion gap: 8 (ref 5–15)
BUN: 5 mg/dL — AB (ref 6–20)
CHLORIDE: 107 mmol/L (ref 101–111)
CO2: 21 mmol/L — AB (ref 22–32)
CREATININE: 0.7 mg/dL (ref 0.44–1.00)
Calcium: 8.7 mg/dL — ABNORMAL LOW (ref 8.9–10.3)
GFR calc Af Amer: >60 mL/min (ref >60)
GLUCOSE: 195 mg/dL — AB (ref 65–99)
Potassium: 3.9 mmol/L (ref 3.5–5.1)
Sodium: 136 mmol/L (ref 135–145)

## 2017-09-10 LAB — POCT GLYCOSYLATED HEMOGLOBIN (HGB A1C): HEMOGLOBIN A1C: 5.5

## 2017-09-10 LAB — I-STAT TROPONIN, ED: TROPONIN I, POC: 0 ng/mL (ref 0.00–0.08)

## 2017-09-10 LAB — D-DIMER, QUANTITATIVE: D-Dimer, Quant: 0.67 ug/mL-FEU — ABNORMAL HIGH (ref 0.00–0.50)

## 2017-09-10 MED ORDER — NITROGLYCERIN 0.4 MG SL SUBL
0.4000 mg | SUBLINGUAL_TABLET | SUBLINGUAL | Status: AC | PRN
  Administered 2017-09-10 – 2017-09-11 (×3): 0.4 mg via SUBLINGUAL
  Filled 2017-09-10: qty 1

## 2017-09-10 MED ORDER — ASPIRIN 81 MG PO CHEW
324.0000 mg | CHEWABLE_TABLET | Freq: Once | ORAL | Status: AC
  Administered 2017-09-10: 324 mg via ORAL
  Filled 2017-09-10: qty 4

## 2017-09-10 MED ORDER — ALBUTEROL SULFATE (2.5 MG/3ML) 0.083% IN NEBU: 5.0000 mg | INHALATION_SOLUTION | Freq: Once | RESPIRATORY_TRACT | Status: DC

## 2017-09-10 NOTE — ED Provider Notes (Signed)
TIME SEEN: 11:05 PM  CHIEF COMPLAINT: Chest pain  HPI: Patient is a 52 year old female with history of asthma, hypothyroidism, diabetes who presents to the emergency department with chest pain and shortness of breath.  Describes the chest pain as a pressure without radiation that is worse with exertion and has been ongoing intermittently for the past several days.  She has had a dry cough but no fever.  No lower extremity swelling or pain.  No history of PE or DVT.  Has had a stress test many years ago.  No history of cardiac catheterization.  No wheezing.  Was seen by her primary care physician with family practice who told her if symptoms worsen she should come to the emergency department.  ROS: See HPI Constitutional: no fever  Eyes: no drainage  ENT: no runny nose   Cardiovascular:   chest pain  Resp:  SOB  GI: no vomiting GU: no dysuria Integumentary: no rash  Allergy: no hives  Musculoskeletal: no leg swelling  Neurological: no slurred speech ROS otherwise negative  PAST MEDICAL HISTORY/PAST SURGICAL HISTORY:  Past Medical History:  Diagnosis Date  . Arthritis    bursitis left hip flares-not an issue  . Asthma 2004  . Edentulous    10-19-13 at present  . Epilepsy ([place]) in 1972   No seizures since 1972. Previously treated with phenobarbital.   . Fever 06/2017  . GERD (gastroesophageal reflux disease) 2008  . Headache(784.0)    hx of migraines   . Hypothyroidism 2008  . Lower esophageal ring 08/18/2013  . Seasonal allergies 2003  . Shortness of breath     MEDICATIONS:  Prior to Admission medications   Medication Sig Start Date End Date Taking? Authorizing Provider  ACCU-CHEK AVIVA PLUS test strip USE UP TO 4 TIMES DAILY AS DIRECTED 08/13/17   [name], MD  ACCU-CHEK SOFTCLIX LANCETS lancets USE UP TO 4 TIMES DAILY AS DIRECTED 08/13/17   [name], MD  albuterol (PROAIR HFA) 108 (90 Base) MCG/ACT inhaler INHALE 2 PUFFS INTO THE LUNGS EVERY 6 HOURS AS  NEEDED FOR SHORTNESS OF BREATH 10/23/16   Mercy Riding, MD  albuterol (PROVENTIL) (2.5 MG/3ML) 0.083% nebulizer solution Take 3 mLs (2.5 mg total) by nebulization every 6 (six) hours as needed for wheezing or shortness of breath. 10/23/16   Mercy Riding, MD  blood glucose meter kit and supplies KIT Dispense based on patient and insurance preference. Use up to four times daily as directed. (FOR ICD-9 250.00, 250.01). 06/19/17   Rory Percy, DO  budesonide-formoterol (SYMBICORT) 160-4.5 MCG/ACT inhaler Inhale 2 puffs into the lungs 2 (two) times daily. 04/05/17   Tanna Furry, MD  cetirizine (ZYRTEC) 10 MG tablet Take 1 tablet (10 mg total) by mouth daily. 01/27/17   [name], MD  fluticasone (FLONASE) 50 MCG/ACT nasal spray Place 2 sprays into both nostrils daily.    [provider]  levothyroxine (SYNTHROID, LEVOTHROID) 125 MCG tablet TAKE 2 TABLETS (250 MCG TOTAL) BY MOUTH DAILY. 08/13/17   [name], MD  metFORMIN (GLUCOPHAGE) 500 MG tablet Take 1 tablet (500 mg total) by mouth 2 (two) times daily with a meal. 06/24/17   Nicolette Bang, DO  pantoprazole (PROTONIX) 40 MG tablet Take 1 tablet (40 mg total) by mouth daily. 03/11/17   [name], MD  pantoprazole (PROTONIX) 40 MG tablet TAKE 1 TABLET BY MOUTH DAILY. 07/20/17   [name], MD  promethazine-dextromethorphan (PROMETHAZINE-DM) 6.25-15 MG/5ML syrup Take 5 mLs by mouth 4 (  four) times daily as needed for cough. 06/19/17   Rory Percy, DO    ALLERGIES:  Allergies  Allergen Reactions  . Aspirin Nausea Only    Upset stomach    SOCIAL HISTORY:  Social History   Tobacco Use  . Smoking status: Former Smoker    Packs/day: 0.25    Types: Cigarettes    Last attempt to quit: 1997    Years since quitting: 22.3  . Smokeless tobacco: Never Used  Substance Use Topics  . Alcohol use: Yes    Alcohol/week: 0.0 oz    Comment: occ    FAMILY HISTORY: Family History  Problem Relation  Age of Onset  . Hypertension Mother   . Diabetes Mother   . Lung cancer Father   . Stroke Father   . Diabetes Sister   . Hypertension Sister     EXAM: BP 111/77 (BP Location: Right Arm)   Pulse 78   Temp 98.9 F (37.2 C) (Oral)   Resp 20   Ht _0  (1.676 m)   Wt 109.8 kg (242 lb)   SpO2 97%   BMI 39.06 kg/m  CONSTITUTIONAL: Alert and oriented and responds appropriately to questions. Well-appearing; well-nourished HEAD: Normocephalic EYES: Conjunctivae clear, pupils appear equal, EOMI ENT: normal nose; moist mucous membranes NECK: Supple, no meningismus, no nuchal rigidity, no LAD  CARD: RRR; S1 and S2 appreciated; no murmurs, no clicks, no rubs, no gallops RESP: Patient becomes tachypneic just talking to me and appears short of breath but is not hypoxic.  Her lungs are clear with good aeration.  No rhonchi rales or wheezing. ABD/GI: Normal bowel sounds; non-distended; soft, non-tender, no rebound, no guarding, no peritoneal signs, no hepatosplenomegaly BACK:  The back appears normal and is non-tender to palpation, there is no CVA tenderness EXT: Normal ROM in all joints; non-tender to palpation; no edema; normal capillary refill; no cyanosis, no calf tenderness or swelling    SKIN: Normal color for age and race; warm; no rash NEURO: Moves all extremities equally PSYCH: The patient's mood and manner are appropriate. Grooming and personal hygiene are appropriate.  MEDICAL DECISION MAKING: Patient here with chest pressure and shortness of breath.  Differential includes ACS, PE.  Less likely dissection.  She recently had pneumonia at the end of March.  She does not feel like this feels the same.  She does not appear volume overloaded.  Will obtain cardiac labs, chest x-ray, d-dimer.  EKG shows no ischemic abnormality.  Will give aspirin, nitroglycerin.  ED PROGRESS: Nitroglycerin is improving patient's pain.  Her troponin is negative.  D-dimer positive.  She is neutropenic but has  history of the same.  Will obtain CT of her chest for further evaluation.  She has not received her chest x-ray yet.  We will cancel the chest x-ray and just obtain a CT.  I doubt that she has a pneumothorax.   Patient's pain down to a 2/10 after 3 nitroglycerin tablets.  Will give dose of fentanyl.   Patient pain-free.  CT scan shows no pulmonary embolus.  She has diffuse mild interstitial prominence and scattered confluent areas of groundglass densities consistent with previous study.  No consolidation.  Will discuss with family medicine for admission for chest pain rule out.  2:06 AM Discussed patient's case with FM resident, Dr. Grandville Silos.  I have recommended admission and patient (and family if present) agree with this plan. Admitting physician will place admission orders.   I reviewed all nursing notes, vitals, pertinent  previous records, EKGs, lab and urine results, imaging (as available).     EKG Interpretation  Date/Time:  Friday Sep 10 2017 23:19:38 EDT Ventricular Rate:  84 PR Interval:    QRS Duration: 91 QT Interval:  389 QTC Calculation: 460 R Axis:   15 Text Interpretation:  Sinus rhythm Minimal ST depression Baseline wander in lead(s) II III aVF V4 V5 V6 No significant change since last tracing Confirmed by Pryor Curia [id number]) on 09/10/2017 11:21:33 PM           EKG Interpretation  Date/Time:  Friday Sep 10 2017 23:19:38 EDT Ventricular Rate:  84 PR Interval:    QRS Duration: 91 QT Interval:  389 QTC Calculation: 460 R Axis:   15 Text Interpretation:  Sinus rhythm Minimal ST depression Baseline wander in lead(s) II III aVF V4 V5 V6 No significant change since last tracing Confirmed by Pryor Curia [phone]) on 09/10/2017 11:21:33 PM          Keavon Sensing, Delice Bison, DO 09/11/17 0207

## 2017-09-10 NOTE — Patient Instructions (Addendum)
It was a pleasure to see you today! Thank you for choosing [facility] for your primary care. Holly Mueller was seen for thyroid, chest pressure.   Our plans for today were:  It is VERY important that you go to your cardiology appointment next week. If your chest pressure gets worse, call EMS or go to the Emergency room.   We will check some labs today to see how your liver and blood counts are doing.   Continue your metformin. Your blood sugar numbers look great.   Keep taking your thyroid medicine as prescribed, I will let you know how those labs look.   It is very important that you call back to get your GI appointment scheduled.   It's ok to stop your symbicort like Dr. Vaughan Browner said. Let's see how your breathing symptoms are doing in 1 month after stopping symbicort and after you see the cardiologist.   I'll place a sports medicine referral for your hip pain.   You should return to our clinic to see Dr. Lindell Noe in 1 month for lung follow up.   Best,  Dr. Lindell Noe

## 2017-09-10 NOTE — Progress Notes (Signed)
CC: chest pressure, thyroid, hip pain   HPI  Her main concern is making sure her PNA is "gone." More "asthma issues" this week. Feels the heat makes it harder for her walk to the bus stop. Has to stop both from CP and feeling like she can't breathe. At work yesterday, she had to stop what she was doing because was having chest pressure, and they were going to call EMS but she said not to. Feels inhalers were taking a while to help. Feels like it is hard to breathe. Feels like pressure, not SOB. Occasional sharp pains as well. Hot, but not sweating. Gets better with prolonged rest, she thinks but says it varies.    Pancytopenia and cirrhosis - had an appt with GI that she missed, needs to reschedule.   Thyroid - taking synthroid as directed.   PNA - finished augmentin. Chest pressure as above.   DM - started on metformin 528m BID while admitted 06/2017. Has been taking this. No lows.   Asthma - [place] bc she doesn't have asthma.   Lastly as she is leaving she describes hip pain. Wants this evaluated as it is limiting her ability to stand at work.   ROS: +Chest pressure and SOB, denies abdominal pain, dysuria, changes in BMs.   CC, SH/smoking status, and VS noted  Objective: BP 114/62   Pulse 83   Temp 97.8 F (36.6 C) (Oral)   Ht _0  (1.651 m)   Wt 242 lb 12.8 oz (110.1 kg)   SpO2 93%   BMI 40.40 kg/m  Gen: NAD, alert, cooperative, and pleasant. HEENT: NCAT, EOMI, PERRL CV: RRR, no murmur, nontender to palation Resp: fine crackles in bilateral bases, no wheezes, non-labored Abd: SNTND, BS present, no guarding or organomegaly Ext: No edema, warm. R hip nontender over trochanteric bursa.  Neuro: Alert and oriented, Speech clear, No gross deficits  Assessment and plan:  Restrictive lung disease Personally reviewed pulm notes. PFTs suggestive of restrictive etiology, DLCO unable to be tested. Unclear cause, but recent  Mitochondrial M2 Ab, IgG elevated and cirrhosis, question autoimmune etiology. No pulmonary symptoms today. Will continue workup for underlying disease after workup for chest pressure. Asked patient to trial off of symbicort.   Cirrhosis of liver without ascites ([place] Has not yet followed up with GI outpatient. Personally reviewed GI inpatient consult 06/2017, needs f/u specifically for varices screening. Liver biopsy was 07/04/2012, but I cannot find the actual path report, just notes saying Dr. NLucia Gaskinsreviewed this with patient. Recent positive AMA, suggestive of possible PBC. Alk phos has been elevated since 2011, which also leans toward PBC. Will work on obtaining liver biopsy results.   Chest pressure Does have concomitant lung disease, but pressure she is describing is suggestive of stable angina. Needs further cardiac workup, urgent cardiology referral placed and appt scheduled before patient left clinic. Given strict return precautions to return to ED for worsening.    Type 2 diabetes mellitus without complication, without long-term current use of insulin (HCC) a1c controlled on metformin, continue current treatment.   Hypothyroidism Has been compliant with 2540m dose, recheck TSH today.   Hip pain: limited HPI 2/2 many problems as above and chest pressure. Patient wants referral to eval this, will send to sports medicine to consider trochanteric bursitis vs OA.  Orders Placed This Encounter  Procedures  . CBC  . CMP14+EGFR  . TSH  . Ambulatory referral to Cardiology  Referral Priority:   Urgent    Referral Type:   Consultation    Referral Reason:   Specialty Services Required    Requested Specialty:   Cardiology    Number of Visits Requested:   1  . Ambulatory referral to Sports Medicine    Referral Priority:   Routine    Referral Type:   Consultation    Number of Visits Requested:   1  . HgB A1c  . EKG 12-Lead    No orders of the defined types were placed in this  encounter.   Health Maintenance reviewed - deferred as patient with acute concerning findings.  Ralene Ok, MD, PGY2 09/11/2017 2:41 PM

## 2017-09-10 NOTE — ED Triage Notes (Signed)
Pt came from home by EMS with SOB complaint. Pt says she saw her dr today for follow up of pneumonia and dr told her to call 911 if she starts feeling more SOB. Pt is set up to see cardiology Tuesday, her PCP thinker her SOB is cardiac related. Pt complains of chest pressure, but no pain

## 2017-09-11 ENCOUNTER — Emergency Department (HOSPITAL_COMMUNITY): Payer: Medicaid Other

## 2017-09-11 ENCOUNTER — Encounter (HOSPITAL_COMMUNITY): Payer: Self-pay | Admitting: General Practice

## 2017-09-11 ENCOUNTER — Other Ambulatory Visit: Payer: Self-pay | Admitting: Physician Assistant

## 2017-09-11 DIAGNOSIS — R0602 Shortness of breath: Secondary | ICD-10-CM | POA: Diagnosis not present

## 2017-09-11 DIAGNOSIS — D72818 Other decreased white blood cell count: Secondary | ICD-10-CM | POA: Diagnosis not present

## 2017-09-11 DIAGNOSIS — K7460 Unspecified cirrhosis of liver: Secondary | ICD-10-CM | POA: Diagnosis not present

## 2017-09-11 DIAGNOSIS — R079 Chest pain, unspecified: Secondary | ICD-10-CM | POA: Diagnosis not present

## 2017-09-11 DIAGNOSIS — R9389 Abnormal findings on diagnostic imaging of other specified body structures: Secondary | ICD-10-CM | POA: Diagnosis not present

## 2017-09-11 DIAGNOSIS — I77819 Aortic ectasia, unspecified site: Secondary | ICD-10-CM

## 2017-09-11 DIAGNOSIS — R0789 Other chest pain: Secondary | ICD-10-CM | POA: Insufficient documentation

## 2017-09-11 DIAGNOSIS — R0609 Other forms of dyspnea: Secondary | ICD-10-CM | POA: Diagnosis not present

## 2017-09-11 LAB — CBC WITH DIFFERENTIAL/PLATELET
BASOS ABS: 0 10*3/uL (ref 0.0–0.1)
Basophils Relative: 2 %
EOS PCT: 7 %
Eosinophils Absolute: 0.1 10*3/uL (ref 0.0–0.7)
HEMATOCRIT: 34.1 % — AB (ref 36.0–46.0)
HEMOGLOBIN: 11.4 g/dL — AB (ref 12.0–15.0)
LYMPHS ABS: 0.4 10*3/uL — AB (ref 0.7–4.0)
LYMPHS PCT: 32 %
MCH: 29.2 pg (ref 26.0–34.0)
MCHC: 33.4 g/dL (ref 30.0–36.0)
MCV: 87.2 fL (ref 78.0–100.0)
MONOS PCT: 7 %
Monocytes Absolute: 0.1 10*3/uL (ref 0.1–1.0)
NEUTROS ABS: 0.8 10*3/uL — AB (ref 1.7–7.7)
Neutrophils Relative %: 52 %
Platelets: 60 10*3/uL — ABNORMAL LOW (ref 150–400)
RBC: 3.91 MIL/uL (ref 3.87–5.11)
RDW: 14.5 % (ref 11.5–15.5)
WBC: 1.4 10*3/uL — AB (ref 4.0–10.5)

## 2017-09-11 LAB — COMPREHENSIVE METABOLIC PANEL
ALK PHOS: 127 U/L — AB (ref 38–126)
ALT: 32 U/L (ref 14–54)
AST: 62 U/L — ABNORMAL HIGH (ref 15–41)
Albumin: 2.7 g/dL — ABNORMAL LOW (ref 3.5–5.0)
Anion gap: 8 (ref 5–15)
BILIRUBIN TOTAL: 2 mg/dL — AB (ref 0.3–1.2)
CO2: 22 mmol/L (ref 22–32)
CREATININE: 0.76 mg/dL (ref 0.44–1.00)
Calcium: 8.6 mg/dL — ABNORMAL LOW (ref 8.9–10.3)
Chloride: 106 mmol/L (ref 101–111)
GFR calc Af Amer: >60 mL/min (ref >60)
Glucose, Bld: 151 mg/dL — ABNORMAL HIGH (ref 65–99)
Potassium: 3.9 mmol/L (ref 3.5–5.1)
Sodium: 136 mmol/L (ref 135–145)
TOTAL PROTEIN: 7.3 g/dL (ref 6.5–8.1)

## 2017-09-11 LAB — RESPIRATORY PANEL BY PCR
Adenovirus: NOT DETECTED
Bordetella pertussis: NOT DETECTED
CORONAVIRUS OC43-RVPPCR: NOT DETECTED
Chlamydophila pneumoniae: NOT DETECTED
Coronavirus 229E: NOT DETECTED
Coronavirus HKU1: NOT DETECTED
Coronavirus NL63: NOT DETECTED
INFLUENZA A-RVPPCR: NOT DETECTED
INFLUENZA B-RVPPCR: NOT DETECTED
METAPNEUMOVIRUS-RVPPCR: NOT DETECTED
MYCOPLASMA PNEUMONIAE-RVPPCR: NOT DETECTED
PARAINFLUENZA VIRUS 1-RVPPCR: NOT DETECTED
PARAINFLUENZA VIRUS 2-RVPPCR: NOT DETECTED
PARAINFLUENZA VIRUS 4-RVPPCR: NOT DETECTED
Parainfluenza Virus 3: NOT DETECTED
RESPIRATORY SYNCYTIAL VIRUS-RVPPCR: NOT DETECTED
Rhinovirus / Enterovirus: DETECTED — AB

## 2017-09-11 LAB — CMP14+EGFR
ALK PHOS: 136 IU/L — AB (ref 39–117)
ALT: 34 IU/L — ABNORMAL HIGH (ref 0–32)
AST: 66 IU/L — AB (ref 0–40)
Albumin/Globulin Ratio: 0.8 — ABNORMAL LOW (ref 1.2–2.2)
Albumin: 3.3 g/dL — ABNORMAL LOW (ref 3.5–5.5)
BILIRUBIN TOTAL: 1.9 mg/dL — AB (ref 0.0–1.2)
BUN/Creatinine Ratio: 10 (ref 9–23)
BUN: 6 mg/dL (ref 6–24)
CHLORIDE: 110 mmol/L — AB (ref 96–106)
CO2: 20 mmol/L (ref 20–29)
CREATININE: 0.61 mg/dL (ref 0.57–1.00)
Calcium: 8.6 mg/dL — ABNORMAL LOW (ref 8.7–10.2)
GFR calc Af Amer: 121 mL/min/{1.73_m2} (ref >59)
GFR calc non Af Amer: 105 mL/min/{1.73_m2} (ref >59)
GLOBULIN, TOTAL: 4.3 g/dL (ref 1.5–4.5)
GLUCOSE: 127 mg/dL — AB (ref 65–99)
Potassium: 3.9 mmol/L (ref 3.5–5.2)
SODIUM: 142 mmol/L (ref 134–144)
Total Protein: 7.6 g/dL (ref 6.0–8.5)

## 2017-09-11 LAB — GLUCOSE, CAPILLARY
Glucose-Capillary: 133 mg/dL — ABNORMAL HIGH (ref 65–99)
Glucose-Capillary: 134 mg/dL — ABNORMAL HIGH (ref 65–99)
Glucose-Capillary: 123 mg/dL — ABNORMAL HIGH (ref 65–99)
Glucose-Capillary: 147 mg/dL — ABNORMAL HIGH (ref 65–99)

## 2017-09-11 LAB — TSH
TSH: 1.59 u[IU]/mL (ref 0.450–4.500)
TSH: 2.282 u[IU]/mL (ref 0.350–4.500)

## 2017-09-11 LAB — CBC
HCT: 32.4 % — ABNORMAL LOW (ref 36.0–46.0)
HEMATOCRIT: 34 % (ref 34.0–46.6)
Hemoglobin: 11.6 g/dL (ref 11.1–15.9)
Hemoglobin: 10.9 g/dL — ABNORMAL LOW (ref 12.0–15.0)
MCH: 29.1 pg (ref 26.6–33.0)
MCH: 29 pg (ref 26.0–34.0)
MCHC: 34.1 g/dL (ref 31.5–35.7)
MCHC: 33.6 g/dL (ref 30.0–36.0)
MCV: 85 fL (ref 79–97)
MCV: 86.2 fL (ref 78.0–100.0)
PLATELETS: 59 10*3/uL — AB (ref 150–400)
Platelets: 65 10*3/uL — CL (ref 150–379)
RBC: 3.99 x10E6/uL (ref 3.77–5.28)
RBC: 3.76 MIL/uL — ABNORMAL LOW (ref 3.87–5.11)
RDW: 14.4 % (ref 12.3–15.4)
RDW: 14.4 % (ref 11.5–15.5)
WBC: 1.5 10*3/uL — AB (ref 3.4–10.8)
WBC: 1.3 10*3/uL — AB (ref 4.0–10.5)

## 2017-09-11 LAB — BRAIN NATRIURETIC PEPTIDE
B Natriuretic Peptide: 45.3 pg/mL (ref 0.0–100.0)
B Natriuretic Peptide: 21.2 pg/mL (ref 0.0–100.0)

## 2017-09-11 LAB — TROPONIN I
Troponin I: <0.03 ng/mL (ref <0.03)
Troponin I: <0.03 ng/mL (ref <0.03)

## 2017-09-11 LAB — LIPID PANEL
CHOL/HDL RATIO: 5.5 ratio
CHOLESTEROL: 144 mg/dL (ref 0–200)
HDL: 26 mg/dL — AB (ref >40)
LDL Cholesterol: 97 mg/dL (ref 0–99)
TRIGLYCERIDES: 105 mg/dL (ref <150)
VLDL: 21 mg/dL (ref 0–40)

## 2017-09-11 MED ORDER — FENTANYL CITRATE (PF) 100 MCG/2ML IJ SOLN
50.0000 ug | Freq: Once | INTRAMUSCULAR | Status: AC
  Administered 2017-09-11: 50 ug via INTRAVENOUS
  Filled 2017-09-11: qty 2

## 2017-09-11 MED ORDER — ALBUTEROL SULFATE (2.5 MG/3ML) 0.083% IN NEBU
2.5000 mg | INHALATION_SOLUTION | Freq: Four times a day (QID) | RESPIRATORY_TRACT | Status: DC | PRN
Start: 2017-09-11 — End: 2017-09-13

## 2017-09-11 MED ORDER — POLYETHYLENE GLYCOL 3350 17 G PO PACK
17.0000 g | PACK | Freq: Every day | ORAL | Status: DC | PRN
Start: 2017-09-11 — End: 2017-09-13
  Administered 2017-09-12: 17 g via ORAL
  Filled 2017-09-11: qty 1

## 2017-09-11 MED ORDER — LEVOTHYROXINE SODIUM 125 MCG PO TABS
250.0000 ug | ORAL_TABLET | Freq: Every day | ORAL | Status: DC
  Administered 2017-09-11 – 2017-09-13 (×3): 250 ug via ORAL
  Filled 2017-09-11 (×4): qty 2

## 2017-09-11 MED ORDER — LORATADINE 10 MG PO TABS
10.0000 mg | ORAL_TABLET | Freq: Every day | ORAL | Status: DC
  Administered 2017-09-11 – 2017-09-13 (×3): 10 mg via ORAL
  Filled 2017-09-11 (×3): qty 1

## 2017-09-11 MED ORDER — ACETAMINOPHEN 650 MG RE SUPP: 650.0000 mg | Freq: Four times a day (QID) | RECTAL | Status: DC | PRN

## 2017-09-11 MED ORDER — PANTOPRAZOLE SODIUM 40 MG PO TBEC
40.0000 mg | DELAYED_RELEASE_TABLET | Freq: Every day | ORAL | Status: DC
  Administered 2017-09-11 – 2017-09-13 (×3): 40 mg via ORAL
  Filled 2017-09-11 (×3): qty 1

## 2017-09-11 MED ORDER — ACETAMINOPHEN 325 MG PO TABS
650.0000 mg | ORAL_TABLET | Freq: Four times a day (QID) | ORAL | Status: DC | PRN
  Administered 2017-09-11 – 2017-09-13 (×2): 650 mg via ORAL
  Filled 2017-09-11 (×2): qty 2

## 2017-09-11 MED ORDER — ASPIRIN EC 81 MG PO TBEC
81.0000 mg | DELAYED_RELEASE_TABLET | Freq: Every day | ORAL | Status: DC
  Administered 2017-09-11 – 2017-09-13 (×3): 81 mg via ORAL
  Filled 2017-09-11 (×3): qty 1

## 2017-09-11 MED ORDER — FLUTICASONE PROPIONATE 50 MCG/ACT NA SUSP
2.0000 | Freq: Every day | NASAL | Status: DC
  Administered 2017-09-11 – 2017-09-13 (×3): 2 via NASAL
  Filled 2017-09-11: qty 16

## 2017-09-11 MED ORDER — INSULIN ASPART 100 UNIT/ML ~~LOC~~ SOLN
0.0000 [IU] | Freq: Three times a day (TID) | SUBCUTANEOUS | Status: DC
  Administered 2017-09-12: 3 [IU] via SUBCUTANEOUS

## 2017-09-11 MED ORDER — IOPAMIDOL (ISOVUE-370) INJECTION 76%
100.0000 mL | Freq: Once | INTRAVENOUS | Status: AC | PRN
  Administered 2017-09-11: 100 mL via INTRAVENOUS

## 2017-09-11 MED ORDER — IOPAMIDOL (ISOVUE-370) INJECTION 76%
INTRAVENOUS | Status: AC
  Filled 2017-09-11: qty 100

## 2017-09-11 MED ORDER — ENOXAPARIN SODIUM 40 MG/0.4ML ~~LOC~~ SOLN: 40.0000 mg | SUBCUTANEOUS | Status: DC

## 2017-09-11 MED ORDER — GI COCKTAIL ~~LOC~~
30.0000 mL | Freq: Three times a day (TID) | ORAL | Status: DC | PRN
  Administered 2017-09-11: 30 mL via ORAL
  Filled 2017-09-11: qty 30

## 2017-09-11 MED ORDER — PNEUMOCOCCAL VAC POLYVALENT 25 MCG/0.5ML IJ INJ
0.5000 mL | INJECTION | INTRAMUSCULAR | Status: AC
  Administered 2017-09-12: 0.5 mL via INTRAMUSCULAR
  Filled 2017-09-11: qty 0.5

## 2017-09-11 NOTE — Assessment & Plan Note (Addendum)
Has not yet followed up with GI outpatient. Personally reviewed GI inpatient consult 06/2017, needs f/u specifically for varices screening. Liver biopsy was 07/04/2012, but I cannot find the actual path report, just notes saying Dr. Lucia Gaskins reviewed this with patient. Recent positive AMA, suggestive of possible PBC. Alk phos has been elevated since 2011, which also leans toward PBC. Will work on obtaining liver biopsy results.

## 2017-09-11 NOTE — Evaluation (Signed)
Physical Therapy Evaluation and Discharge Patient Details Name: Holly Mueller MRN: [id number] DOB: 04/07/1966 Today's Date: 09/11/2017   History of Present Illness  Holly Mueller is a 52 y.o. female with a hx of morbid obesity, cirrhosis, DM, asthma, hx of seizures, depression, hypothyroidism, chronic leukopenia and thrombocytopenia, and GERD who is being seen today for the evaluation of chest pain at the request of Dr. Dallas Schimke.    Clinical Impression  Patient evaluated by Physical Therapy with no further acute PT needs identified. All education has been completed and the patient has no further questions. PTA, pt living her 2  Adult sons and roommates, with 24 hour support avail after d/c. Patient works at [employer] and takes bus to work, independent with mobility and ADLs. Upon eval pt presents with improved chest pain. Ambulating 200' with supervision and no AD, as well as stairs this session. Pt reports confidence in returning home and remaining safe. Discussed daily exercise recommendations, pt verbalizes understanding.  See below for any follow-up Physical Therapy or equipment needs. PT is signing off. Thank you for this referral.     Follow Up Recommendations No PT follow up    Equipment Recommendations  None recommended by PT    Recommendations for Other Services       Precautions / Restrictions Precautions Precautions: None Restrictions Weight Bearing Restrictions: No      Mobility  Bed Mobility Overal bed mobility: Modified Independent                Transfers Overall transfer level: Modified independent Equipment used: None                Ambulation/Gait Ambulation/Gait assistance: Supervision Ambulation Distance (Feet): 200 Feet Assistive device: None Gait Pattern/deviations: WFL(Within Functional Limits) Gait velocity: slight decrease   General Gait Details: mild unsteadiness but no overt LOB or path deviation, no AD  Stairs Stairs:  Yes Stairs assistance: Supervision Stair Management: No rails Number of Stairs: 4 General stair comments: patient ambulating stairs without rails alternating pattern, no LOB, reports confidence in doing at home. advised to have family with her upon entry incase she is feeling weak from hospital stay.  Wheelchair Mobility    Modified Rankin (Stroke Patients Only)       Balance Overall balance assessment: No apparent balance deficits (not formally assessed)                                           Pertinent Vitals/Pain Pain Assessment: No/denies pain    Home Living Family/patient expects to be discharged to:: Private residence Living Arrangements: Children;Non-relatives/Friends Available Help at Discharge: Family;Friend(s);Available 24 hours/day Type of Home: House Home Access: Stairs to enter Entrance Stairs-Rails: None Entrance Stairs-Number of Steps: 2 Home Layout: One level Home Equipment: Environmental consultant - 2 wheels Additional Comments: roomate works during the day, kids are 16yo and 76 yo, not in school, don't work    Prior Function Level of Independence: Independent         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[employer], drilling   Dominant Hand: Right    Extremity/Trunk Assessment   Upper Extremity Assessment Upper Extremity Assessment: Overall WFL for tasks assessed    Lower Extremity Assessment Lower Extremity Assessment: Overall WFL for tasks assessed    Cervical / Trunk Assessment Cervical / Trunk Assessment: Normal  Communication  Communication: No difficulties  Cognition Arousal/Alertness: Awake/alert Behavior During Therapy: WFL for tasks assessed/performed Overall Cognitive Status: Within Functional Limits for tasks assessed                                        General Comments      Exercises     Assessment/Plan    PT Assessment Patent does not need any further PT services  PT Problem List         PT  Treatment Interventions      PT Goals (Current goals can be found in the Care Plan section)  Acute Rehab PT Goals Patient Stated Goal: go home when ready PT Goal Formulation: With patient Potential to Achieve Goals: Good    Frequency     Barriers to discharge        Co-evaluation               AM-PAC PT "6 Clicks" Daily Activity  Outcome Measure Difficulty turning over in bed (including adjusting bedclothes, sheets and blankets)?: None Difficulty moving from lying on back to sitting on the side of the bed? : None Difficulty sitting down on and standing up from a chair with arms (e.g., wheelchair, bedside commode, etc,.)?: None Help needed moving to and from a bed to chair (including a wheelchair)?: None Help needed walking in hospital room?: None Help needed climbing 3-5 steps with a railing? : None 6 Click Score: 24    End of Session Equipment Utilized During Treatment: Gait belt Activity Tolerance: Patient tolerated treatment well Patient left: in bed;with call bell/phone within reach Nurse Communication: Mobility status PT Visit Diagnosis: Unsteadiness on feet (R26.81)    Time: 8250-0370 PT Time Calculation (min) (ACUTE ONLY): 23 min   Charges:   PT Evaluation $PT Eval Low Complexity: 1 Low PT Treatments $Gait Training: 8-22 mins   PT G Codes:       Reinaldo Berber, PT, DPT Acute Rehab Services Pager: [phone]    Reinaldo Berber 09/11/2017, 4:21 PM

## 2017-09-11 NOTE — ED Notes (Signed)
Patient transported to CT 

## 2017-09-11 NOTE — Assessment & Plan Note (Signed)
Does have concomitant lung disease, but pressure she is describing is suggestive of stable angina. Needs further cardiac workup, urgent cardiology referral placed and appt scheduled before patient left clinic. Given strict return precautions to return to ED for worsening.

## 2017-09-11 NOTE — Assessment & Plan Note (Signed)
a1c controlled on metformin, continue current treatment.

## 2017-09-11 NOTE — Progress Notes (Signed)
FPTS Interim Progress Note  S: Patient with some chest discomfort after laying down after breakfast.   O: BP 127/79 (BP Location: Right Arm)   Pulse 71   Temp 98 F (36.7 C) (Oral)   Resp 18   Ht 5' 6" (1.676 m)   Wt 240 lb 9.6 oz (109.1 kg) Comment: scale a  SpO2 95%   BMI 38.83 kg/m   Gen: Patient lying in bed in NAD.  HEENT: MMM, no teeth present. No JVD. CV: RRR no murmur, nontender to palpation Pulm: easy WOB, crackles in bilateral bases, good air movement, no wheezing GI: SNTND +BS MSK: no edema Skin: warm, dry Neuro: grossly normal, moves all extremities  A/P: Chest Pressure: exertional, improved with nitro. EKG without signs of ischemia, and trop neg x 2. Needs further workup for unstable angina. Cards consulted this am.  -cards consulted, appreciate recs -nitro PRN -EKG PRN chest pressure  SOB: etiology unclear. Follows with Dr. Vaughan Browner for pulm, PFTs restrictive and unable to complete DLCO. No ILD on multiple CTAs, question whether she needs hi res CT as an outpatient. No signs of PE. Likely acutely exacerbated by rhino/entero infection.  -albuterol PRN -consider outpatient hi res CT  GERD: EGD in 2015 with dilation of distal esophageal ring, possible EOE, chronic gastritis. Today with some different chest discomfort associated with lying down while eating breakfast, she states this is different than the pressure she feels while walking.  -GI cocktail PRN -continue protonix  Cirrhosis with bicytopenia: needs GI f/u (for cirrhosis management, EGD for screening for varices), missed her appt and didn't get f/u yet.  -outpatient GI -daily CBC  Hypothyroidism: TSH appropriate, continue current dose.  -levothyroxine at home dose  DM II: controlled on home metformin per a1c in clinic 5/3.  -sensitive SSI -CBGs q4, can transition to daily on BMP if stable  [name], MD 09/11/2017, 8:16 AM PGY-2, [facility] pager [phone]

## 2017-09-11 NOTE — H&P (Addendum)
[facility] Admission History and Physical Service Pager: [phone]  Patient name: Holly Mueller Medical record number: [id number] Date of birth: 11/19/1965 Age: 52 y.o. Gender: female  Primary Care Provider: [name], MD Consultants: Cardiology Code Status: DNI (Confirmed on Admission)  Chief Complaint: Chest pain w/ shortness of breath  Assessment and Plan: Holly Mueller is a 52 y.o. female presenting with 1 week of worsening chest pain and shortness of breath . PMH is significant for hypothyroidism, depression, DMII, migraines, H/o seizures, obesity, cirrhosis, Bicytopenia , GERD, ?asthma, HFpEF G1DD  Chest pain with Shortness of Breath Worsening chest pressure and DOE over the past week. CP is typical as it is substernal, can radiate to left, improves with rest and nitro (given in ED). CP has also occurred at rest. Heart Score 3. SOB is chronic, but worse over the past week. Also endorsing worsing cough, congestion, and recent sick contact with cold. On admission, pt is afebrile, VSS, SORA. She had negative iSTAT troponin x1. EKG showed SR with baseline wander, but no obvious ST changes. Remainder of labs were unremarkable. CTA Chest was negative for PE, but did show diffuse mild interstitial prominence and ground-glass densities without consolidation. These findings have appeared stable across multiple CXR and CT images in patients chart for the past several years. Differential for patient presentation includes ACS, acute heart failure, asthma  exacerbation, pneumonia. Regarding ACS, initial work up not indicative of STEMI given negative troponin and EKG. Regarding patient's risk, she does have DM increasing risk of atypical presentation, but recent A1C was 5.5 showing it is well controlled. Patient did have an exercise stress test in 06/2015 that was equivocal due to poor exercise tolerance. Myoview was ordered, but never completed. Will repeat EKG and  trend troponin in order to rule ACS. Patient was also seen in office on 5/3 for similar presentation presentation of symptoms and she does have outpatient cardiology appointment on 5/7 for myoview. Overall, this does not appear to ACS, but will consult cardiology to see if they want to perform while patient is inpatient vs. Outpatient work up. CHF could also cause patient's constellation of symptoms. Recent echocardiogram on 06/2017 showed EF 60-65% w/ G1DD. Pt does have crackles on lung exam, but no pulmonary edema was noted on CTA and no only 1+ pitting edema in lower extremities. Crackles are not likely due to pulmonary edema, given no evidence on CTA. Rather, are likely remnants of recent pneumonia in 07/2017. Therefore, CHF exacerbation is not expected to be cause of symptoms. Will get BNP to assess for CHF. Patient does have reported h/o asthma. She was seen by outpatient pulmonologist on 06/2017, who upon review of PFTs which were non-obstructive, did not feel that pt had asthma. Since then he has stopped her home pulmocort. Pt also has no wheeze on exam. Asthma is not likely the cause of her symptoms. Pt did endorse cold like symptoms, including cough, congestion, runny nose, and recent sick contact with similar viral URI type symptoms. Pt did not have luekocytosis, but is chronically leukopenic. Pts worsening dyspnea could be due to viral URI on top of a more chronic dyspnea. Will obtain RVP to evaluate further. Pt has a history of depression. She endorses multiple stressors from home and at work. Perhaps, undiagnosed co-morbid anxiety is contributing to symptoms. Pt does also have a h/o GERD. This could be contributing to her CP and causing some of her respiratory symptoms (congestion and cough), but does not explain  her DOE. Of note, the PFTs done 11/2016 showed severe restriction. Was unable to do DLCO testing at that time. With her history of cirrhosis of uncertain etiology (no alcohol history and  possibly primary biliary cirrhosis with positive antimitochondrial antibody), consider ILD contributing to DOE.  - admit to telemetry, attending Dr. Mingo Amber - trend troponins - repeat EKG - consult cardiology in AM  - RVP - risk stratification labs: lipid, TSH - ASA 81 mg - home albuterol prn - cardiac monitoring - continuous pulse ox - supplemental O2 as necessary to maintain sats > 92% - tylenol prn - daily weights - strict I/Os - vital signs per floor - home Claritin, fluticasone   Hypothyroidism Most recent TSH was 10.9 on 06/2017. Pt on synthroid 250 mcg daily.  - recheck TSH - cont home synthroid  H/o Depression Not on any medications. Denies depression. Endorses multiple stressors at home. Denies SI/HI.  - monitor mood  Diabetis Meletis Type II Last A1C on 5/3 was 5.5. On metformin at home - hold metformin while inpatient - SSIs w/ q4h cbg checks  Migraines No HA at moment. - tylenol prn  Cirrhosis with bicytopenia WBC 1.4 on admit. Plt 60. Pt has Cirrhosis w/ chronic leukocopenia and thrombocytopenia. Labs at her baseline. In 06/2017, pt had elevated Mitochondrial M2 Ab consistent w/ primary biliary cirrhosis. Further work up of Cirrhosis negative A1AT, AFP, ASMA. No h/o excessive alcohol use. Cytopenia work up per hematology showed negative findings in bone marrow biopsy. Last note on 07/2017 "Molecular and cytogenetic studies are negative for clonal processes." Hem/Onc recommended further GI work up - CMP - monitor CBC - recommend further work up w/ outpatient GI  GERD - cont home protonix  ?Asthma As described upon.  - cont home albuterol prn  Chronic Fatigue Pt endorsing ongoing fatigue at work and increased SOB. Possibly due to worsening hypothyroidism vs. Chronic deconditioning due to ongoing dyspnea - TSH - PT  FEN/GI: HHD/Carb modified Prophylaxis: LMWH  Disposition: Observation for CP work up  History of Present Illness:  Holly Mueller is a  52 y.o. female presenting with 1 week of worsening chest pain and shortness of breath. Pt was seen by her PCP for symptoms. She wanted to make sure her PNA was "clear". Outpatient follow up with cardiology was scheduled at her PCP appointment with strict return precautions to go to the ED. Later on in the evening, patient developed CP at the dinner table. She was recently treated for CAP at the end of March, where she completed a course of Augmentin. She reports completing the whole course of abx for 1 week. She denies any symptoms of CP or cough since completing the antibiotics until the start of this past week. Although she does endorse having ongoing SOB for a many years. Over the past week, she has had worsening SOB and chest pressure, especially with excursion. Pain is substernal and can radiate to the left side of her chest. It is brought on by walking or doing household chores. It does improve with rest. It does not seem to be associated with eating.The chest pain can also occur at rest. It feels different then when she had pneumonia. Patient has tried her home albuterol for the shortness of breath without much relief. Patient also reports worsening productive cough, runny nose over this time. She did encounter a sick neighbors child earlier this week who had cold like symptoms. She denies any fevers/chills, nausea, vomiting, diarrhea, constipation, hematuria, dysuria.  Review Of Systems: Per HPI with the following additions:   Review of Systems  Constitutional: Positive for malaise/fatigue. Negative for chills and fever.  HENT: Positive for congestion.   Respiratory: Positive for cough, sputum production and shortness of breath. Negative for hemoptysis.   Cardiovascular: Positive for chest pain. Negative for palpitations, orthopnea and leg swelling.  Gastrointestinal: Negative for abdominal pain, blood in stool, constipation, diarrhea, heartburn, melena, nausea and vomiting.  Genitourinary:  Negative for dysuria, frequency and urgency.  Musculoskeletal: Negative for falls and myalgias.  Neurological: Negative for dizziness.  Psychiatric/Behavioral: Negative for depression, substance abuse and suicidal ideas.    Patient Active Problem List   Diagnosis Date Noted  . Chest pain 09/11/2017  . Cellulitis of left lower extremity   . Pulmonary edema 06/30/2017  . Left leg pain   . Type 2 diabetes mellitus without complication, without long-term current use of insulin ([place])   . Bibasilar crackles   . Influenza-like illness   . Influenza A   . Neutropenic fever ([place]) 06/14/2017  . Cirrhosis of liver without ascites ([place]) 11/17/2016  . Thrombocytopenia ([place]) 11/17/2016  . Other pancytopenia ([place]) 11/17/2016  . Bicytopenia 10/24/2016  . Epistaxis 10/23/2016  . GERD (gastroesophageal reflux disease) 10/23/2016  . Cough 12/09/2015  . Chronic pain of left thumb 10/09/2015  . Precordial chest pain 08/03/2015  . Abscess of skin of abdomen 07/10/2014  . Bursitis of left hip 09/14/2013  . Lower esophageal ring 08/18/2013  . Dyspnea 08/17/2013  . Ganglion cyst of wrist 08/24/2012  . Hip pain, bilateral 08/23/2012  . Umbilical hernia 63/14/9702  . Healthcare maintenance 02/14/2012  . Osteoarthritis 02/12/2012  . Elevated LFTs 10/30/2011  . Allergic rhinitis 08/26/2011  . Obesity 07/08/2010  . SEIZURES, HX OF 12/12/2009  . DENTAL CARIES 12/18/2008  . DEPRESSION 07/26/2008  . MIGRAINE HEADACHE 11/15/2007  . Hypothyroidism 10/12/2006  . Asthma 07/30/2006    Past Medical History: Past Medical History:  Diagnosis Date  . Arthritis    bursitis left hip flares-not an issue  . Asthma 2004  . Edentulous    10-19-13 at present  . Epilepsy ([place]) in 1972   No seizures since 1972. Previously treated with phenobarbital.   . Fever 06/2017  . GERD (gastroesophageal reflux disease) 2008  . Headache(784.0)    hx of migraines   . Hypothyroidism 2008  . Lower esophageal ring  08/18/2013  . Seasonal allergies 2003  . Shortness of breath     Past Surgical History: Past Surgical History:  Procedure Laterality Date  . BALLOON DILATION N/A 10/24/2013   Procedure: BALLOON DILATION;  Surgeon: Gatha Mayer, MD;  Location: [facility] ENDOSCOPY;  Service: Endoscopy;  Laterality: N/A;  . CESAREAN SECTION     x2  . DENTAL SURGERY     multiple extractions 3'15  . ESOPHAGOGASTRODUODENOSCOPY N/A 10/24/2013   Procedure: ESOPHAGOGASTRODUODENOSCOPY (EGD);  Surgeon: Gatha Mayer, MD;  Location: [facility];  Service: Endoscopy;  Laterality: N/A;  . LIVER BIOPSY  06/30/2012   Procedure: LIVER BIOPSY;  Surgeon: Shann Medal, MD;  Location: [facility] ORS;  Service: General;;  . NOVASURE ABLATION    . SHOULDER ARTHROSCOPY Right 2011  . UMBILICAL HERNIA REPAIR N/A 06/30/2012   Procedure: remove umbilicus;  Surgeon: Shann Medal, MD;  Location: [facility] ORS;  Service: General;  Laterality: N/A;  . VENTRAL HERNIA REPAIR N/A 06/30/2012   Procedure: LAPAROSCOPIC VENTRAL HERNIA;  Surgeon: Shann Medal, MD;  Location: [facility];  Service: General;  Laterality:  N/A;  With Mesh  . WISDOM TOOTH EXTRACTION      Social History: Social History   Tobacco Use  . Smoking status: Former Smoker    Packs/day: 0.25    Types: Cigarettes    Last attempt to quit: 1997    Years since quitting: 22.3  . Smokeless tobacco: Never Used  Substance Use Topics  . Alcohol use: Yes    Alcohol/week: 0.0 oz    Comment: occ  . Drug use: No   Additional social history: Endorses occational alcohol use, 1/2 glass a wine/month. Denies drugs. Works part time at the [employer].  Please also refer to relevant sections of EMR.  Family History: Family History  Problem Relation Age of Onset  . Hypertension Mother   . Diabetes Mother   . Lung cancer Father   . Stroke Father   . Diabetes Sister   . Hypertension Sister    Allergies and Medications: Allergies  Allergen Reactions  . Aspirin Nausea Only    Upset stomach    No current facility-administered medications on file prior to encounter.    Current Outpatient Medications on File Prior to Encounter  Medication Sig Dispense Refill  . albuterol (PROAIR HFA) 108 (90 Base) MCG/ACT inhaler INHALE 2 PUFFS INTO THE LUNGS EVERY 6 HOURS AS NEEDED FOR SHORTNESS OF BREATH 8.5 g 5  . albuterol (PROVENTIL) (2.5 MG/3ML) 0.083% nebulizer solution Take 3 mLs (2.5 mg total) by nebulization every 6 (six) hours as needed for wheezing or shortness of breath. 75 mL 2  . cetirizine (ZYRTEC) 10 MG tablet Take 1 tablet (10 mg total) by mouth daily. 30 tablet 11  . fluticasone (FLONASE) 50 MCG/ACT nasal spray Place 2 sprays into both nostrils daily.    Marland Kitchen levothyroxine (SYNTHROID, LEVOTHROID) 125 MCG tablet TAKE 2 TABLETS (250 MCG TOTAL) BY MOUTH DAILY. 60 tablet 0  . metFORMIN (GLUCOPHAGE) 500 MG tablet Take 1 tablet (500 mg total) by mouth 2 (two) times daily with a meal. 180 tablet 0  . pantoprazole (PROTONIX) 40 MG tablet TAKE 1 TABLET BY MOUTH DAILY. 30 tablet 3  . ACCU-CHEK AVIVA PLUS test strip USE UP TO 4 TIMES DAILY AS DIRECTED 100 each 0  . ACCU-CHEK SOFTCLIX LANCETS lancets USE UP TO 4 TIMES DAILY AS DIRECTED 100 each 0  . blood glucose meter kit and supplies KIT Dispense based on patient and insurance preference. Use up to four times daily as directed. (FOR ICD-9 250.00, 250.01). 1 each 0  . budesonide-formoterol (SYMBICORT) 160-4.5 MCG/ACT inhaler Inhale 2 puffs into the lungs 2 (two) times daily. (Patient not taking: Reported on 09/10/2017) 1 Inhaler 12  . pantoprazole (PROTONIX) 40 MG tablet Take 1 tablet (40 mg total) by mouth daily. (Patient not taking: Reported on 09/10/2017) 30 tablet 3  . promethazine-dextromethorphan (PROMETHAZINE-DM) 6.25-15 MG/5ML syrup Take 5 mLs by mouth 4 (four) times daily as needed for cough. (Patient not taking: Reported on 09/10/2017) 120 mL 0    Objective: BP 127/80   Pulse 90   Temp 98.9 F (37.2 C) (Oral)   Resp 19   Ht _0  (1.676  m)   Wt 242 lb (109.8 kg)   SpO2 98%   BMI 39.06 kg/m  Exam: Gen: NAD, is dyspnic while talking, having to stop in the middle of sentences to catch breath, but maintaining normal O2 sats on RA, obese HEENT: MMM, no remaining dentition, no oropharngeal lesions, no JVD, PERRLA, EOMI, anicteric  CV: RRR with no murmurs appreciated,  chest in not tender to palaption Pulm: NWOB, dyspnic while talking, fine-velcro-like crackles in mid and lower lung fields bilaterally.  GI: Normal bowel sounds present. Soft, Nontender, Nondistended. No caput medusa MSK: no edema, cyanosis, or clubbing noted, no Terri's nails, some mild clubbing Skin: warm, dry Neuro: grossly normal, moves all extremities Psych: Normal affect and thought content  Labs and Imaging: CBC BMET  Recent Labs  Lab 09/10/17 2319  WBC 1.4*  HGB 11.4*  HCT 34.1*  PLT 60*   Recent Labs  Lab 09/10/17 2319  NA 136  K 3.9  CL 107  CO2 21*  BUN 5*  CREATININE 0.70  GLUCOSE 195*  CALCIUM 8.7*     Ct Angio Chest Pe W And/or Wo Contrast  Result Date: 09/11/2017 CLINICAL DATA:  52 year old female with chest pain and shortness of breath. Concern for pulmonary embolism. EXAM: CT ANGIOGRAPHY CHEST WITH CONTRAST TECHNIQUE: Multidetector CT imaging of the chest was performed using the standard protocol during bolus administration of intravenous contrast. Multiplanar CT image reconstructions and MIPs were obtained to evaluate the vascular anatomy. CONTRAST:  119m ISOVUE-370 IOPAMIDOL (ISOVUE-370) INJECTION 76% COMPARISON:  Chest radiograph dated 08/04/2017 and CT dated 06/30/2017 FINDINGS: Cardiovascular: There is no cardiomegaly or pericardial effusion. The thoracic aorta is unremarkable. Evaluation of the pulmonary arteries is limited due to respiratory motion artifact and suboptimal opacification of the peripheral branches. No pulmonary artery embolus identified. Mediastinum/Nodes: There is no hilar or mediastinal adenopathy. Esophagus is  grossly unremarkable. There is prominence of the left thyroid lobe inferiorly which may represent a nodule. Ultrasound may provide better evaluation of the thyroid gland. No mediastinal fluid collection. Lungs/Pleura: There is shallow inspiration with bibasilar atelectatic changes. Mild diffuse interstitial prominence may be partly related to atelectatic changes. Scattered confluent areas of ground-glass densities predominantly involving the lung bases as seen previously. No focal consolidative changes. There is no pleural effusion or pneumothorax. The central airways are patent. Upper Abdomen: Cirrhosis and splenomegaly.  Upper abdominal varices. Musculoskeletal: Mild degenerative changes of the spine. No acute osseous pathology. Review of the MIP images confirms the above findings. IMPRESSION: 1. No CT evidence of pulmonary embolism. 2. Diffuse mild interstitial prominence and scattered confluent areas of ground-glass densities similar to prior study. No focal consolidation. 3. Cirrhosis with evidence of portal hypertension. Electronically Signed   By: AAnner CreteM.D.   On: 09/11/2017 01:06    TBonnita Hollow MD 09/11/2017, 3:42 AM PGY-1, [place] pager: [phone] text pages welcome  UPPER LEVEL ADDENDUM  I have read the above note and made revisions highlighted in blue.  KSmiley Houseman MD PGY-2 MZac[facility] PontesFamily Medicine Pager [phone]

## 2017-09-11 NOTE — ED Notes (Signed)
Delay in lab draw,  Admitting MD at bedside.

## 2017-09-11 NOTE — Progress Notes (Signed)
   09/11/17 0438  Vitals  Temp 98 F (36.7 C)  Temp Source Oral  BP 127/79  MAP (mmHg) 93  BP Location Right Arm  BP Method Automatic  Patient Position (if appropriate) Lying  Pulse Rate 71  Pulse Rate Source Monitor  Resp 18  Oxygen Therapy  SpO2 95 %  O2 Device Room Air  Admitted pt to rm 3E07 from ED, pt alert and oriented, denied pain at this time, oriented to room, call bell placed within reach, placed on cardiac monitor, CCMD made aware.

## 2017-09-11 NOTE — Consult Note (Addendum)
Name: Holly Mueller MRN: [id number] DOB: 09/29/1965    ADMISSION DATE:  09/10/2017 CONSULTATION DATE:  09/11/17  REFERRING MD :  Dr. Dallas Schimke / [facility]   CHIEF COMPLAINT:  Chest pressure    HISTORY OF PRESENT ILLNESS:  52 y/o F who presented to [facility] on 5/3 with a several day history of chest pain.   She has a history of cirrhosis (non-ETOH Hx, biopsy proven, 06/2012), prior esophageal strictures s/p dilation (Dr. Ardis Hughs), arthritis and non-specific chest pain since 2017.  Per history, this can be with exertion or at rest and spontaneously resolves.   On admit, the patient was ruled out for ACS with negative troponin & EKG without acute evidence for ischemia.  Cardiology was consulted.  She has not been able to tolerate as stress test in the past.  Myoview pending per Cardiolgoy  She has previously been seen by Dr. Vaughan Browner for asthma work up and underwent PFT's in 11/2016.  PFT's at that time demonstrated restrictive lung disease and she was taken off her Symbicort (which she had been on for years).  She also has seen Dr. Neldon Mc for allergy evaluation > per her report, she does not have food allergies but does to mold, grass etc.  She lives with a smoker and has all her life.  Denies wood burning stove, occupational exposures.  She is limited in regards to physical activity.  States she has had chest pain / shortness of breath with exertion for approximately 20 years.  CTA of the chest was evaluated and negative for PE but did show diffuse ground glass opacities. Further radiographic review shows she has had waxing & waning pulmonary opacities in the last two years.  RVP positive for rhinovirus.  The patient reports she had the flu in March 209 and a subsequent PNA. The patient is edentulous.  She does report difficulty with swallowing and food hanging up at times.    PCCM consulted for evaluation of chest pressure.       PAST MEDICAL HISTORY :   has a past medical history of Arthritis, Asthma (2004),  Edentulous, Epilepsy ([place]) (in 1972), Fever (06/2017), GERD (gastroesophageal reflux disease) (2008), Headache(784.0), Hypothyroidism (2008), Lower esophageal ring (08/18/2013), Seasonal allergies (2003), and Shortness of breath.   has a past surgical history that includes Novasure ablation; Shoulder arthroscopy (Right, 2011); Cesarean section; Wisdom tooth extraction; Ventral hernia repair (N/A, 06/30/2012); Umbilical hernia repair (N/A, 06/30/2012); Liver biopsy (06/30/2012); Dental surgery; Esophagogastroduodenoscopy (N/A, 10/24/2013); and Balloon dilation (N/A, 10/24/2013).  Prior to Admission medications   Medication Sig Start Date End Date Taking? Authorizing Provider  albuterol (PROAIR HFA) 108 (90 Base) MCG/ACT inhaler INHALE 2 PUFFS INTO THE LUNGS EVERY 6 HOURS AS NEEDED FOR SHORTNESS OF BREATH 10/23/16  Yes Gonfa, Taye T, MD  albuterol (PROVENTIL) (2.5 MG/3ML) 0.083% nebulizer solution Take 3 mLs (2.5 mg total) by nebulization every 6 (six) hours as needed for wheezing or shortness of breath. 10/23/16  Yes Mercy Riding, MD  cetirizine (ZYRTEC) 10 MG tablet Take 1 tablet (10 mg total) by mouth daily. 01/27/17  Yes [name], MD  fluticasone [facility]) 50 MCG/ACT nasal spray Place 2 sprays into both nostrils daily.   Yes [provider]  levothyroxine (SYNTHROID, LEVOTHROID) 125 MCG tablet TAKE 2 TABLETS (250 MCG TOTAL) BY MOUTH DAILY. 08/13/17  Yes [name], MD  metFORMIN (GLUCOPHAGE) 500 MG tablet Take 1 tablet (500 mg total) by mouth 2 (two) times daily with a meal. 06/24/17  Yes Juleen China,  Bayard Beaver, DO  pantoprazole (PROTONIX) 40 MG tablet TAKE 1 TABLET BY MOUTH DAILY. 07/20/17  Yes [name], MD  ACCU-CHEK AVIVA PLUS test strip USE UP TO 4 TIMES DAILY AS DIRECTED 08/13/17   [name], MD  ACCU-CHEK SOFTCLIX LANCETS lancets USE UP TO 4 TIMES DAILY AS DIRECTED 08/13/17   [name], MD  blood glucose meter kit and supplies KIT Dispense based  on patient and insurance preference. Use up to four times daily as directed. (FOR ICD-9 250.00, 250.01). 06/19/17   Rory Percy, DO  budesonide-formoterol (SYMBICORT) 160-4.5 MCG/ACT inhaler Inhale 2 puffs into the lungs 2 (two) times daily. Patient not taking: Reported on 09/10/2017 04/05/17   Tanna Furry, MD  pantoprazole (PROTONIX) 40 MG tablet Take 1 tablet (40 mg total) by mouth daily. Patient not taking: Reported on 09/10/2017 03/11/17   [name], MD  promethazine-dextromethorphan (PROMETHAZINE-DM) 6.25-15 MG/5ML syrup Take 5 mLs by mouth 4 (four) times daily as needed for cough. Patient not taking: Reported on 09/10/2017 06/19/17   Rory Percy, DO    Allergies  Allergen Reactions  . Aspirin Nausea Only    Upset stomach    FAMILY HISTORY:  family history includes Diabetes in her mother and sister; Hypertension in her mother and sister; Lung cancer in her father; Stroke in her father.  SOCIAL HISTORY:  reports that she quit smoking about 22 years ago. Her smoking use included cigarettes. She smoked 0.25 packs per day. She has never used smokeless tobacco. She reports that she drinks alcohol. She reports that she does not use drugs.  REVIEW OF SYSTEMS:  POSITIVES IN BOLD Constitutional: Negative for fever, chills, weight loss, malaise/fatigue and diaphoresis.  HENT: Negative for hearing loss, ear pain, nosebleeds, congestion, sore throat, neck pain, tinnitus and ear discharge.   Eyes: Negative for blurred vision, double vision, photophobia, pain, discharge and redness.  Respiratory: Negative for cough, hemoptysis, sputum production, shortness of breath, wheezing and stridor.   Cardiovascular: Negative for chest pain, palpitations, orthopnea, claudication, leg swelling and PND.  Gastrointestinal: Negative for heartburn, nausea, vomiting, abdominal pain, diarrhea, constipation, blood in stool and melena.  Genitourinary: Negative for dysuria, urgency, frequency, hematuria and  flank pain.  Musculoskeletal: Negative for myalgias, back pain, joint pain and falls.  Skin: Negative for itching and rash.  Neurological: Negative for dizziness, tingling, tremors, sensory change, speech change, focal weakness, seizures, loss of consciousness, weakness and headaches.  Endo/Heme/Allergies: Negative for environmental allergies and polydipsia. Does not bruise/bleed easily.   SUBJECTIVE:   VITAL SIGNS: Temp:  [97.8 F (36.6 C)-98.9 F (37.2 C)] 97.8 F (36.6 C) (05/04 1241) Pulse Rate:  [62-90] 62 (05/04 1241) Resp:  [14-25] 18 (05/04 1241) BP: (96-128)/(61-80) 112/61 (05/04 1241) SpO2:  [94 %-98 %] 96 % (05/04 1241) Weight:  [240 lb 9.6 oz (109.1 kg)-242 lb (109.8 kg)] 240 lb 9.6 oz (109.1 kg) (05/04 0438)  PHYSICAL EXAMINATION: General:  Obese adult female in NAD, sitting up in bed Neuro:  AAOx4, speech clear, MAE HEENT:  Edentulous, pupils 41m=/R, no jvd Cardiovascular:  s1s2 rrr, SEM Lungs:  Even/non-labored on RA, lungs clear anterior, crackles bilateral posterior  Abdomen:  Obese / soft, bsx4 active  Musculoskeletal:  No acute deformities  Skin:  Warm/dry, no edema   Recent Labs  Lab 09/10/17 1530 09/10/17 2319 09/11/17 0630  NA 142 136 136  K 3.9 3.9 3.9  CL 110* 107 106  CO2 20 21* 22  BUN 6 5* <5*  CREATININE 0.61 0.70 0.76  GLUCOSE 127* 195* 151*    Recent Labs  Lab 09/10/17 1530 09/10/17 2319 09/11/17 0630  HGB 11.6 11.4* 10.9*  HCT 34.0 34.1* 32.4*  WBC 1.5* 1.4* 1.3*  PLT 65* 60* 59*    Ct Angio Chest Pe W And/or Wo Contrast  Result Date: 09/11/2017 CLINICAL DATA:  52 year old female with chest pain and shortness of breath. Concern for pulmonary embolism. EXAM: CT ANGIOGRAPHY CHEST WITH CONTRAST TECHNIQUE: Multidetector CT imaging of the chest was performed using the standard protocol during bolus administration of intravenous contrast. Multiplanar CT image reconstructions and MIPs were obtained to evaluate the vascular anatomy.  CONTRAST:  168m ISOVUE-370 IOPAMIDOL (ISOVUE-370) INJECTION 76% COMPARISON:  Chest radiograph dated 08/04/2017 and CT dated 06/30/2017 FINDINGS: Cardiovascular: There is no cardiomegaly or pericardial effusion. The thoracic aorta is unremarkable. Evaluation of the pulmonary arteries is limited due to respiratory motion artifact and suboptimal opacification of the peripheral branches. No pulmonary artery embolus identified. Mediastinum/Nodes: There is no hilar or mediastinal adenopathy. Esophagus is grossly unremarkable. There is prominence of the left thyroid lobe inferiorly which may represent a nodule. Ultrasound may provide better evaluation of the thyroid gland. No mediastinal fluid collection. Lungs/Pleura: There is shallow inspiration with bibasilar atelectatic changes. Mild diffuse interstitial prominence may be partly related to atelectatic changes. Scattered confluent areas of ground-glass densities predominantly involving the lung bases as seen previously. No focal consolidative changes. There is no pleural effusion or pneumothorax. The central airways are patent. Upper Abdomen: Cirrhosis and splenomegaly.  Upper abdominal varices. Musculoskeletal: Mild degenerative changes of the spine. No acute osseous pathology. Review of the MIP images confirms the above findings. IMPRESSION: 1. No CT evidence of pulmonary embolism. 2. Diffuse mild interstitial prominence and scattered confluent areas of ground-glass densities similar to prior study. No focal consolidation. 3. Cirrhosis with evidence of portal hypertension. Electronically Signed   By: AAnner CreteM.D.   On: 09/11/2017 01:06    SIGNIFICANT EVENTS  5/03  Admit with chest pain.  EKG, troponin negative   STUDIES CTA Chest 5/4 >> no evidence for PE, mild diffuse interstitial prominence & scattered confluent areas of GGO similar to prior study. No focal consolidation.  Cirrhosis with evidence of portal hypertension.  CULTURES RVP 5/4 >>  rhinovirus detected   ANTIBIOTICS    ASSESSMENT / PLAN:  Discussion:  52y/o F with hx of cirrhosis (non-alcohol), prior esophageal strictures s/p dilation, allergies and restrictive lung disease.  In 07/2017 she had the flu and a subsequent PNA.  She was admitted with recurrent chest pain.  Work up thus far is negative for a cardiac source.  Myoview pending.  Found to have rhinovirus.  She does have an unusual constellation of symptoms - waxing/waning ground glass opacities on CT, shortness of breath, chest pressure, esophageal strictures and cirrhosis (thought to be NASH).  She does not have evidence of fibrosis on CT.  Her hx of GI symptoms, lack of teeth and swallowing difficulties raise the question of recurrent aspiration.  She does have restrictive lung disease on PFT.  ? If some of her symptoms are also related to lack of physical activity / deconditioning in the setting of obesity.   Bilateral Ground Glass Opacities  Restrictive Lung Disease Rhinovirus  Seasonal Allergies  Rule Out Dysphagia / Aspiration   Plan: Assess ANA, SCL70, RNP  Continue allergy regimen > Claritin, flonase PRN albuterol  PT efforts / ambulation / OOB  Assess BNP  SLP evaluation with swallow eval   R/O Ischemia  Plan: Defer work up to Cardiology  Thank you for the consultation.  We will follow along with you.   Noe Gens, NP-C  Pulmonary & Critical Care Pgr: [phone] or if no answer [phone] 09/11/2017, 2:56 PM

## 2017-09-11 NOTE — Consult Note (Addendum)
Cardiology Consultation:   Patient ID: LEKIA NIER; [id number]; [birth year]   Admit date: 09/10/2017 Date of Consult: 09/11/2017  Primary Care Provider: [name], MD Primary Cardiologist: Sanda Klein, MD  Primary Electrophysiologist:     Patient Profile:   Holly Mueller is a 52 y.o. female with a hx of morbid obesity, cirrhosis, DM, asthma, hx of seizures, depression, hypothyroidism, chronic leukopenia and thrombocytopenia, and GERD who is being seen today for the evaluation of chest pain at the request of Dr. Dallas Schimke.  History of Present Illness:   Holly Mueller saw Dr. Sallyanne Kuster as a new patient 08/02/15 for chest discomfort that was associated with emotional distress largely stemming from her teenage son. She underwent a POET but was not able to achieve adequate heart rate. Staff attempted to reach the patient to schedule a myoview, but this was never completed. She was recently hospitalized for fever and shortness of breath. Echocardiogram showed normal LVEF and grade 1 DD, no RWMA.   She has had nonspecific chest pain intermittently since 2017. 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 The pain always spontaneously resolves. She sometimes has the pain for an entire day. She had a bout of chest pain on Thursday and didn't feel well for the entire day. She recently had PNA treated with ABX. She returned to see her PCP yesterday for follow up of her PNA.  The PCP advised her to go to the ER if she had a recurrence of that chest pain from Thursday. She had a recurrence of the chest pain yesterday, which has been the same chest pain that she has had since 2017. No associated symptoms, no relieving or aggravating factors. She gets short of breath with minimal exertion, but this is not new for her. The pain is located in her central chest. Her last bout of chest pain was this morning after she ate breakfast.     Past Medical History:    Diagnosis Date  . Arthritis    bursitis left hip flares-not an issue  . Asthma 2004  . Edentulous    10-19-13 at present  . Epilepsy ([place]) in 1972   No seizures since 1972. Previously treated with phenobarbital.   . Fever 06/2017  . GERD (gastroesophageal reflux disease) 2008  . Headache(784.0)    hx of migraines   . Hypothyroidism 2008  . Lower esophageal ring 08/18/2013  . Seasonal allergies 2003  . Shortness of breath     Past Surgical History:  Procedure Laterality Date  . BALLOON DILATION N/A 10/24/2013   Procedure: BALLOON DILATION;  Surgeon: Gatha Mayer, MD;  Location: [facility];  Service: Endoscopy;  Laterality: N/A;  . CESAREAN SECTION     x2  . DENTAL SURGERY     multiple extractions 3'15  . ESOPHAGOGASTRODUODENOSCOPY N/A 10/24/2013   Procedure: ESOPHAGOGASTRODUODENOSCOPY (EGD);  Surgeon: Gatha Mayer, MD;  Location: [facility];  Service: Endoscopy;  Laterality: N/A;  . LIVER BIOPSY  06/30/2012   Procedure: LIVER BIOPSY;  Surgeon: Shann Medal, MD;  Location: [facility];  Service: General;;  . NOVASURE ABLATION    . SHOULDER ARTHROSCOPY Right 2011  . UMBILICAL HERNIA REPAIR N/A 06/30/2012   Procedure: remove umbilicus;  Surgeon: Shann Medal, MD;  Location: [facility];  Service: General;  Laterality: N/A;  . VENTRAL HERNIA REPAIR N/A 06/30/2012   Procedure: LAPAROSCOPIC VENTRAL HERNIA;  Surgeon: Shann Medal, MD;  Location: [facility];  Service: General;  Laterality: N/A;  With Mesh  . WISDOM TOOTH EXTRACTION       Home Medications:  Prior to Admission medications   Medication Sig Start Date End Date Taking? Authorizing Provider  albuterol (PROAIR HFA) 108 (90 Base) MCG/ACT inhaler INHALE 2 PUFFS INTO THE LUNGS EVERY 6 HOURS AS NEEDED FOR SHORTNESS OF BREATH 10/23/16  Yes Gonfa, Taye T, MD  albuterol (PROVENTIL) (2.5 MG/3ML) 0.083% nebulizer solution Take 3 mLs (2.5 mg total) by nebulization every 6 (six) hours as needed for wheezing or shortness of breath. 10/23/16   Yes Mercy Riding, MD  cetirizine (ZYRTEC) 10 MG tablet Take 1 tablet (10 mg total) by mouth daily. 01/27/17  Yes [name], MD  fluticasone [facility]) 50 MCG/ACT nasal spray Place 2 sprays into both nostrils daily.   Yes [provider]  levothyroxine (SYNTHROID, LEVOTHROID) 125 MCG tablet TAKE 2 TABLETS (250 MCG TOTAL) BY MOUTH DAILY. 08/13/17  Yes [name], MD  metFORMIN (GLUCOPHAGE) 500 MG tablet Take 1 tablet (500 mg total) by mouth 2 (two) times daily with a meal. 06/24/17  Yes Nicolette Bang, DO  pantoprazole (PROTONIX) 40 MG tablet TAKE 1 TABLET BY MOUTH DAILY. 07/20/17  Yes [name], MD  ACCU-CHEK AVIVA PLUS test strip USE UP TO 4 TIMES DAILY AS DIRECTED 08/13/17   [name], MD  ACCU-CHEK SOFTCLIX LANCETS lancets USE UP TO 4 TIMES DAILY AS DIRECTED 08/13/17   [name], MD  blood glucose meter kit and supplies KIT Dispense based on patient and insurance preference. Use up to four times daily as directed. (FOR ICD-9 250.00, 250.01). 06/19/17   Rory Percy, DO  budesonide-formoterol (SYMBICORT) 160-4.5 MCG/ACT inhaler Inhale 2 puffs into the lungs 2 (two) times daily. Patient not taking: Reported on 09/10/2017 04/05/17   Tanna Furry, MD  pantoprazole (PROTONIX) 40 MG tablet Take 1 tablet (40 mg total) by mouth daily. Patient not taking: Reported on 09/10/2017 03/11/17   [name], MD  promethazine-dextromethorphan (PROMETHAZINE-DM) 6.25-15 MG/5ML syrup Take 5 mLs by mouth 4 (four) times daily as needed for cough. Patient not taking: Reported on 09/10/2017 06/19/17   Rory Percy, DO    Inpatient Medications: Scheduled Meds: . aspirin EC  81 mg Oral Daily  . enoxaparin (LOVENOX) injection  40 mg Subcutaneous Q24H  . fluticasone  2 spray Each Nare Daily  . insulin aspart  0-9 Units Subcutaneous TID WC  . iopamidol      . levothyroxine  250 mcg Oral QAC breakfast  . loratadine  10 mg Oral Daily  . pantoprazole  40 mg  Oral Daily  . [START ON 09/12/2017] pneumococcal 23 valent vaccine  0.5 mL Intramuscular Tomorrow-1000   Continuous Infusions:  PRN Meds: acetaminophen **OR** acetaminophen, albuterol, polyethylene glycol  Allergies:    Allergies  Allergen Reactions  . Aspirin Nausea Only    Upset stomach    Social History:   Social History   Socioeconomic History  . Marital status: Single    Spouse name: Not on file  . Number of children: 2  . Years of education: Not on file  . Highest education level: Not on file  Occupational History    Employer: [employer]  . Financial resource strain: Not on file  . Food insecurity:    Worry: Not on file    Inability: Not on file  . Transportation needs:    Medical: Not on file    Non-medical: Not on file  Tobacco Use  . Smoking status: Former Smoker    Packs/day: 0.25    Types: Cigarettes    Last attempt to quit: 1997    Years since quitting: 22.3  . Smokeless tobacco: Never Used  Substance and Sexual Activity  . Alcohol use: Yes    Alcohol/week: 0.0 oz    Comment: occ  . Drug use: No  . Sexual activity: Never  Lifestyle  . Physical activity:    Days per week: Not on file    Minutes per session: Not on file  . Stress: Not on file  Relationships  . Social connections:    Talks on phone: Not on file    Gets together: Not on file    Attends religious service: Not on file    Active member of club or organization: Not on file    Attends meetings of clubs or organizations: Not on file    Relationship status: Not on file  . Intimate partner violence:    Fear of current or ex partner: Not on file    Emotionally abused: Not on file    Physically abused: Not on file    Forced sexual activity: Not on file  Other Topics Concern  . Not on file  Social History Narrative   Works in Estate manager/land agent at [employer].    Lives with 12 and 14 (boys).    Also living with close friends of the family (mother and two kids, adults).             Family History:    Family History  Problem Relation Age of Onset  . Hypertension Mother   . Diabetes Mother   . Lung cancer Father   . Stroke Father   . Diabetes Sister   . Hypertension Sister      ROS:  Please see the history of present illness.   All other ROS reviewed and negative.     Physical Exam/Data:   Vitals:   09/11/17 0245 09/11/17 0412 09/11/17 0438 09/11/17 0900  BP: 127/80  127/79   Pulse: 90  71 72  Resp: 19  18   Temp:  98.7 F (37.1 C) 98 F (36.7 C)   TempSrc:  Oral Oral   SpO2: 98%  95% 95%  Weight:   240 lb 9.6 oz (109.1 kg)   Height:   _0  (1.676 m)     Intake/Output Summary (Last 24 hours) at 09/11/2017 0905 Last data filed at 09/11/2017 0618 Gross per 24 hour  Intake 240 ml  Output 300 ml  Net -60 ml   Filed Weights   09/10/17 2228 09/11/17 0438  Weight: 242 lb (109.8 kg) 240 lb 9.6 oz (109.1 kg)   Body mass index is 38.83 kg/m.  General:  Morbidly obese female, in no acute distress HEENT: normal Neck: no JVD Vascular: No carotid bruits Cardiac:  normal S1, S2; RRR; no murmur Lungs:  Respirations unlabored, crackles in bases Abd: soft, nontender, no hepatomegaly  Ext: no edema Musculoskeletal:  No deformities, BUE and BLE strength normal and equal Skin: warm and dry  Neuro:  CNs 2-12 intact, no focal abnormalities noted Psych:  Normal affect   EKG:  The EKG was personally reviewed and demonstrates:  Sinus rhythm Telemetry:  Telemetry was personally reviewed and demonstrates:  NSR  Relevant CV Studies:  Echo 06/30/17: Study Conclusions - Left ventricle: The cavity size was normal. Wall thickness was   normal. Systolic function was normal. The estimated ejection  fraction was in the range of 60% to 65%. Wall motion was normal;   there were no regional wall motion abnormalities. Doppler   parameters are consistent with abnormal left ventricular   relaxation (grade 1 diastolic dysfunction). - Aortic valve:  Trileaflet; mildly thickened, mildly calcified   leaflets. - Mitral valve: There was trivial regurgitation. - Left atrium: The atrium was mildly dilated.  Laboratory Data:  Chemistry Recent Labs  Lab 09/10/17 1530 09/10/17 2319 09/11/17 0630  NA 142 136 136  K 3.9 3.9 3.9  CL 110* 107 106  CO2 20 21* 22  GLUCOSE 127* 195* 151*  BUN 6 5* <5*  CREATININE 0.61 0.70 0.76  CALCIUM 8.6* 8.7* 8.6*  GFRNONAA 105 >60 >60  GFRAA 121 >60 >60  ANIONGAP  --  8 8    Recent Labs  Lab 09/10/17 1530 09/11/17 0630  PROT 7.6 7.3  ALBUMIN 3.3* 2.7*  AST 66* 62*  ALT 34* 32  ALKPHOS 136* 127*  BILITOT 1.9* 2.0*   Hematology Recent Labs  Lab 09/10/17 1530 09/10/17 2319 09/11/17 0630  WBC 1.5* 1.4* 1.3*  RBC 3.99 3.91 3.76*  HGB 11.6 11.4* 10.9*  HCT 34.0 34.1* 32.4*  MCV 85 87.2 86.2  MCH 29.1 29.2 29.0  MCHC 34.1 33.4 33.6  RDW 14.4 14.5 14.4  PLT 65* 60* 59*   Cardiac Enzymes Recent Labs  Lab 09/11/17 0304  TROPONINI <0.03    Recent Labs  Lab 09/10/17 2323  TROPIPOC 0.00    BNP Recent Labs  Lab 09/10/17 2319  BNP 45.3    DDimer  Recent Labs  Lab 09/10/17 2319  DDIMER 0.67*    Radiology/Studies:  Ct Angio Chest Pe W And/or Wo Contrast  Result Date: 09/11/2017 CLINICAL DATA:  52 year old female with chest pain and shortness of breath. Concern for pulmonary embolism. EXAM: CT ANGIOGRAPHY CHEST WITH CONTRAST TECHNIQUE: Multidetector CT imaging of the chest was performed using the standard protocol during bolus administration of intravenous contrast. Multiplanar CT image reconstructions and MIPs were obtained to evaluate the vascular anatomy. CONTRAST:  131m ISOVUE-370 IOPAMIDOL (ISOVUE-370) INJECTION 76% COMPARISON:  Chest radiograph dated 08/04/2017 and CT dated 06/30/2017 FINDINGS: Cardiovascular: There is no cardiomegaly or pericardial effusion. The thoracic aorta is unremarkable. Evaluation of the pulmonary arteries is limited due to respiratory motion  artifact and suboptimal opacification of the peripheral branches. No pulmonary artery embolus identified. Mediastinum/Nodes: There is no hilar or mediastinal adenopathy. Esophagus is grossly unremarkable. There is prominence of the left thyroid lobe inferiorly which may represent a nodule. Ultrasound may provide better evaluation of the thyroid gland. No mediastinal fluid collection. Lungs/Pleura: There is shallow inspiration with bibasilar atelectatic changes. Mild diffuse interstitial prominence may be partly related to atelectatic changes. Scattered confluent areas of ground-glass densities predominantly involving the lung bases as seen previously. No focal consolidative changes. There is no pleural effusion or pneumothorax. The central airways are patent. Upper Abdomen: Cirrhosis and splenomegaly.  Upper abdominal varices. Musculoskeletal: Mild degenerative changes of the spine. No acute osseous pathology. Review of the MIP images confirms the above findings. IMPRESSION: 1. No CT evidence of pulmonary embolism. 2. Diffuse mild interstitial prominence and scattered confluent areas of ground-glass densities similar to prior study. No focal consolidation. 3. Cirrhosis with evidence of portal hypertension. Electronically Signed   By: AAnner CreteM.D.   On: 09/11/2017 01:06    Assessment and Plan:   1. Chest pain - troponin x 3 negative - EKG without acute signs of ischemia  She describes chest pain with typical and atypical features. The pain is not worse with movement or activity. This is the same type of pain she has had intermittently since 2017. She presented to the hospital at the request of her PCP if she had a recurrence of chest pain. She has risk factors for ACS, including diabetes and obesity. However, A1c is 5.5 - DM well-controlled. She does not have a significant family history of ACS. She is not NPO. She has an outpatient myoview scheduled for 09/14/17. Will cancel this outpatient NST and  order [facility] lexiscan tomorrow.  2. DM A1c 5.5%, well-controlled  3. Obesity Encouraged proper diet and walking program. She gets DOE, not new for her. Echo with normal EF. She appears euvolemic in exam.     For questions or updates, please contact [place] Please consult [email or web address] for contact info under Cardiology/STEMI.   Signed, Tami Lin Duke, PA  09/11/2017 9:05 AM  I have seen, examined the patient, and reviewed the above assessment and plan.  Changes to above are made where necessary.  On exam, RRR.  Her chest pain is more atypical than typical by history.  She does however have CV risk factors.  EKG without ischemic changes and Trops negative.  I have offered outpatient stress test as previously ordered.  She is very concerned and would prefer to have this done while in the hospital. Unfortunately, she has already eaten this am. Will therefore order stress testing for tomorrow.  Given her size, a 2 day study may be required.   Co Sign: Thompson Grayer, MD 09/11/2017 10:55 AM

## 2017-09-11 NOTE — Assessment & Plan Note (Addendum)
Personally reviewed pulm notes. PFTs suggestive of restrictive etiology, DLCO unable to be tested. Unclear cause, but recent Mitochondrial M2 Ab, IgG elevated and cirrhosis, question autoimmune etiology. No pulmonary symptoms today. Will continue workup for underlying disease after workup for chest pressure. Asked patient to trial off of symbicort.

## 2017-09-11 NOTE — Assessment & Plan Note (Signed)
Has been compliant with 234mg dose, recheck TSH today.

## 2017-09-12 ENCOUNTER — Observation Stay (HOSPITAL_COMMUNITY): Payer: Medicaid Other

## 2017-09-12 ENCOUNTER — Observation Stay (HOSPITAL_BASED_OUTPATIENT_CLINIC_OR_DEPARTMENT_OTHER): Payer: Medicaid Other

## 2017-09-12 ENCOUNTER — Encounter (HOSPITAL_COMMUNITY): Payer: Self-pay | Admitting: *Deleted

## 2017-09-12 DIAGNOSIS — E039 Hypothyroidism, unspecified: Secondary | ICD-10-CM | POA: Diagnosis not present

## 2017-09-12 DIAGNOSIS — D72818 Other decreased white blood cell count: Secondary | ICD-10-CM | POA: Diagnosis not present

## 2017-09-12 DIAGNOSIS — R079 Chest pain, unspecified: Secondary | ICD-10-CM

## 2017-09-12 DIAGNOSIS — K7460 Unspecified cirrhosis of liver: Secondary | ICD-10-CM | POA: Diagnosis not present

## 2017-09-12 DIAGNOSIS — R0609 Other forms of dyspnea: Secondary | ICD-10-CM | POA: Diagnosis not present

## 2017-09-12 LAB — CBC
HCT: 33.6 % — ABNORMAL LOW (ref 36.0–46.0)
Hemoglobin: 11.3 g/dL — ABNORMAL LOW (ref 12.0–15.0)
MCH: 29.5 pg (ref 26.0–34.0)
MCHC: 33.6 g/dL (ref 30.0–36.0)
MCV: 87.7 fL (ref 78.0–100.0)
PLATELETS: 67 10*3/uL — AB (ref 150–400)
RBC: 3.83 MIL/uL — ABNORMAL LOW (ref 3.87–5.11)
RDW: 14.5 % (ref 11.5–15.5)
WBC: 1.4 10*3/uL — AB (ref 4.0–10.5)

## 2017-09-12 LAB — GLUCOSE, CAPILLARY
GLUCOSE-CAPILLARY: 206 mg/dL — AB (ref 65–99)
GLUCOSE-CAPILLARY: 144 mg/dL — AB (ref 65–99)
Glucose-Capillary: 142 mg/dL — ABNORMAL HIGH (ref 65–99)

## 2017-09-12 LAB — NM MYOCAR MULTI W/SPECT W/WALL MOTION / EF
CHL CUP RESTING HR STRESS: 69 {beats}/min
CSEPHR: 55 %
MPHR: 168 {beats}/min
Peak HR: 94 {beats}/min

## 2017-09-12 LAB — BASIC METABOLIC PANEL
Anion gap: 8 (ref 5–15)
BUN: 8 mg/dL (ref 6–20)
CALCIUM: 8.3 mg/dL — AB (ref 8.9–10.3)
CO2: 20 mmol/L — ABNORMAL LOW (ref 22–32)
Chloride: 107 mmol/L (ref 101–111)
Creatinine, Ser: 0.72 mg/dL (ref 0.44–1.00)
GFR calc Af Amer: >60 mL/min (ref >60)
Glucose, Bld: 116 mg/dL — ABNORMAL HIGH (ref 65–99)
Potassium: 4.1 mmol/L (ref 3.5–5.1)
SODIUM: 135 mmol/L (ref 135–145)

## 2017-09-12 MED ORDER — REGADENOSON 0.4 MG/5ML IV SOLN
0.4000 mg | Freq: Once | INTRAVENOUS | Status: AC
  Administered 2017-09-12: 0.4 mg via INTRAVENOUS
  Filled 2017-09-12: qty 5

## 2017-09-12 MED ORDER — TECHNETIUM TC 99M TETROFOSMIN IV KIT
30.0000 | PACK | Freq: Once | INTRAVENOUS | Status: AC | PRN
  Administered 2017-09-12: 30 via INTRAVENOUS

## 2017-09-12 MED ORDER — REGADENOSON 0.4 MG/5ML IV SOLN
INTRAVENOUS | Status: AC
  Filled 2017-09-12: qty 5

## 2017-09-12 MED ORDER — TECHNETIUM TC 99M TETROFOSMIN IV KIT
10.0000 | PACK | Freq: Once | INTRAVENOUS | Status: AC | PRN
  Administered 2017-09-12: 10 via INTRAVENOUS

## 2017-09-12 NOTE — Progress Notes (Signed)
Modified Barium Swallow Progress Note  Patient Details  Name: Holly Mueller MRN: [id number] Date of Birth: 1965-09-28  Today's Date: 09/12/2017  Modified Barium Swallow completed.  Full report located under Chart Review in the Imaging Section.  Brief recommendations include the following:  Clinical Impression  Patient presents with a normal oropharyngeal swallow with full airway protection. No  SLP f/u indicated. Cannot r/o post prandial aspiration given h/o esoophageal strictures although patient does not endorse recent difficulty s/p most recetn dilitation.     Swallow Evaluation Recommendations       SLP Diet Recommendations: Regular solids;Thin liquid   Liquid Administration via: Cup;Straw   Medication Administration: Whole meds with liquid   Supervision: Patient able to self feed   Compensations: Small sips/bites;Slow rate   Postural Changes: Seated upright at 90 degrees;Remain semi-upright after after feeds/meals (Comment)   Oral Care Recommendations: Oral care BID       Gabriel Rainwater MA, CCC-SLP [phone]  Mykal Kirchman Meryl 09/12/2017,11:39 AM

## 2017-09-12 NOTE — Progress Notes (Signed)
Progress Note   Subjective   Doing well today, the patient denies CP or SOB.  No new concerns  Inpatient Medications    Scheduled Meds: . aspirin EC  81 mg Oral Daily  . fluticasone  2 spray Each Nare Daily  . insulin aspart  0-9 Units Subcutaneous TID WC  . levothyroxine  250 mcg Oral QAC breakfast  . loratadine  10 mg Oral Daily  . pantoprazole  40 mg Oral Daily  . pneumococcal 23 valent vaccine  0.5 mL Intramuscular Tomorrow-1000  . regadenoson       Continuous Infusions:  PRN Meds: acetaminophen **OR** acetaminophen, albuterol, gi cocktail, polyethylene glycol   Vital Signs    Vitals:   09/12/17 0916 09/12/17 0919 09/12/17 0921 09/12/17 0923  BP: 99/67 128/83 124/79 119/76  Pulse: 74 86 85 78  Resp:      Temp:      TempSrc:      SpO2:      Weight:      Height:        Intake/Output Summary (Last 24 hours) at 09/12/2017 0948 Last data filed at 09/12/2017 0725 Gross per 24 hour  Intake 840 ml  Output 1626 ml  Net -786 ml   Filed Weights   09/10/17 2228 09/11/17 0438 09/12/17 0542  Weight: 242 lb (109.8 kg) 240 lb 9.6 oz (109.1 kg) 238 lb 15.7 oz (108.4 kg)    Telemetry    sinus - Personally Reviewed  Physical Exam   GEN- The patient is overweight appearing, alert and oriented x 3 today.   Head- normocephalic, atraumatic Eyes-  Sclera clear, conjunctiva pink Ears- hearing intact Oropharynx- clear Neck- supple, Lungs- Clear to ausculation bilaterally, normal work of breathing Heart- Regular rate and rhythm  GI- soft, NT, ND, + BS Extremities- no clubbing, cyanosis, or edema  MS- no significant deformity or atrophy Skin- no rash or lesion Psych- euthymic mood, full affect Neuro- strength and sensation are intact   Labs    Chemistry Recent Labs  Lab 09/10/17 1530 09/10/17 2319 09/11/17 0630 09/12/17 0438  NA 142 136 136 135  K 3.9 3.9 3.9 4.1  CL 110* 107 106 107  CO2 20 21* 22 20*  GLUCOSE 127* 195* 151* 116*  BUN 6 5* <5* 8    CREATININE 0.61 0.70 0.76 0.72  CALCIUM 8.6* 8.7* 8.6* 8.3*  PROT 7.6  --  7.3  --   ALBUMIN 3.3*  --  2.7*  --   AST 66*  --  62*  --   ALT 34*  --  32  --   ALKPHOS 136*  --  127*  --   BILITOT 1.9*  --  2.0*  --   GFRNONAA 105 >60 >60 >60  GFRAA 121 >60 >60 >60  ANIONGAP  --  _0 Hematology Recent Labs  Lab 09/10/17 2319 09/11/17 0630 09/12/17 0438  WBC 1.4* 1.3* 1.4*  RBC 3.91 3.76* 3.83*  HGB 11.4* 10.9* 11.3*  HCT 34.1* 32.4* 33.6*  MCV 87.2 86.2 87.7  MCH 29.2 29.0 29.5  MCHC 33.4 33.6 33.6  RDW 14.5 14.4 14.5  PLT 60* 59* 67*    Cardiac Enzymes Recent Labs  Lab 09/11/17 0304 09/11/17 0626 09/11/17 1414  TROPONINI <0.03 <0.03 <0.03    Recent Labs  Lab 09/10/17 2323  TROPIPOC 0.00        Assessment & Plan    1.  Chest pain Both typical and  typical features myoview today Further decisions pending results of myoview Lifestyle modification encouraged  2. Obesity Body mass index is 38.57 kg/m.   Thompson Grayer MD, [facility] 09/12/2017 9:48 AM

## 2017-09-12 NOTE — Progress Notes (Signed)
Holly Mueller presented for a nuclear stress test today.  No immediate complications.  Stress imaging is pending at this time.  Preliminary EKG findings may be listed in the chart, but the stress test result will not be finalized until perfusion imaging is complete.  Tami Lin Kearsten Ginther, PA-C 09/12/2017, 9:27 AM

## 2017-09-12 NOTE — Progress Notes (Signed)
Family Medicine Teaching Service Daily Progress Note Intern Pager: [phone]  Patient name: Holly Mueller Medical record number: [id number] Date of birth: Aug 15, 1965 Age: 52 y.o. Gender: female  Primary Care Provider: [name], MD Consultants: pulm, cards Code Status: PARTIAL  Pt Overview and Major Events to Date:  5/4 admitted with chest pressure 5/5 stress test planned  Assessment and Plan: Holly Mueller is a 52 y.o. female presenting with 1 week of worsening chest pain and shortness of breath . PMH is significant for hypothyroidism, depression, DMII, migraines, H/o seizures, obesity, cirrhosis, Bicytopenia , GERD, ?asthma, HFpEF G1DD  Chest pain with Shortness of Breath exertional, improved with nitro. EKG without signs of ischemia, and trop neg x 3. Needs further workup for unstable angina. Cards consulted, patient down for stress test this morning. -cards consulted, appreciate recs -nitro PRN -EKG PRN chest pressure  SOB etiology unclear. Follows with Dr. Vaughan Browner for pulm, PFTs restrictive and unable to complete DLCO. No ILD on multiple CTAs, question whether she needs hi res CT as an outpatient. No signs of PE. Likely acutely exacerbated by rhino/entero infection. Pulm consulted 5/4.  -pulm following appreciate recs, considering chronic aspiration -albuterol PRN -consider outpatient hi res CT  Cirrhosis with bicytopenia WBC 1.4 on admit. Plt 60. Pt has Cirrhosis w/ chronic leukocopenia and thrombocytopenia. Labs at her baseline. In 06/2017, pt had elevated Mitochondrial M2 Ab consistent w/ primary biliary cirrhosis. Further work up of Cirrhosis negative A1AT, AFP, ASMA. No h/o excessive alcohol use. Cytopenia work up per hematology showed negative findings in bone marrow biopsy. Needs outpatient GI followup. Biopsy results from 2014 nonspecific. - CMP - monitor CBC - recommend further work up w/ outpatient GI  Hypothyroidism Most recent TSH controlled on 09/10/17.  Pt on synthroid 250 mcg daily.  - cont home synthroid  DM Type II Last A1C on 5/3 was 5.5. On metformin at home - hold metformin while inpatient - SSIs w/ q4h cbg checks  Migraines No HA at moment. - tylenol prn  GERD - cont home protonix  Chronic Fatigue Pt endorsing ongoing fatigue at work and increased SOB. Possibly due to worsening hypothyroidism vs. Chronic deconditioning due to ongoing dyspnea - TSH - PT  FEN/GI: NPO pending stress test and barium swallow Prophylaxis: SCDs given thrombocytopenia   Disposition: pending further cardiac workup  Subjective:  Patient down for stress test this am. RN states no concerns overnight.   Objective: Temp:  [97.8 F (36.6 C)-98.2 F (36.8 C)] 98.2 F (36.8 C) (05/05 0542) Pulse Rate:  [62-75] 75 (05/05 0542) Resp:  [18-20] 20 (05/05 0542) BP: (103-112)/(61-72) 103/66 (05/05 0542) SpO2:  [95 %-96 %] 95 % (05/05 0542) Weight:  [238 lb 15.7 oz (108.4 kg)] 238 lb 15.7 oz (108.4 kg) (05/05 0542) Physical Exam: Attending will attest with physical exam after she returns from stress test.   Laboratory: Recent Labs  Lab 09/10/17 2319 09/11/17 0630 09/12/17 0438  WBC 1.4* 1.3* 1.4*  HGB 11.4* 10.9* 11.3*  HCT 34.1* 32.4* 33.6*  PLT 60* 59* 67*   Recent Labs  Lab 09/10/17 1530 09/10/17 2319 09/11/17 0630 09/12/17 0438  NA 142 136 136 135  K 3.9 3.9 3.9 4.1  CL 110* 107 106 107  CO2 20 21* 22 20*  BUN 6 5* <5* 8  CREATININE 0.61 0.70 0.76 0.72  CALCIUM 8.6* 8.7* 8.6* 8.3*  PROT 7.6  --  7.3  --   BILITOT 1.9*  --  2.0*  --  ALKPHOS 136*  --  127*  --   ALT 34*  --  32  --   AST 66*  --  62*  --   GLUCOSE 127* 195* 151* 116*    Imaging/Diagnostic Tests: No new imaging.  [name], MD 09/12/2017, 7:35 AM PGY-2, [facility] Intern pager: [phone], text pages welcome

## 2017-09-12 NOTE — Progress Notes (Addendum)
Family Medicine Teaching Service Daily Progress Note Intern Pager: [phone]  Patient name: Holly Mueller Medical record number: [id number] Date of birth: 1965/10/20 Age: 52 y.o. Gender: female  Primary Care Provider: [name], MD Consultants: pulm, cards Code Status: PARTIAL  Pt Overview and Major Events to Date:  5/4 admitted with chest pressure 5/5 stress test planned  Assessment and Plan: Holly Mueller is a 53 y.o. female presenting with 1 week of worsening chest pain and shortness of breath . PMH is significant for hypothyroidism, depression, DMII, migraines, H/o seizures, obesity, cirrhosis, Bicytopenia , GERD, RLD, HFpEF G1DD  Chest pain, resolved CP resolved. Stress test resulted as low risk study. Likely due to cough from URI.  - Cardiology recommended life style management   SOB, improved SORA. Etiology unclear. PFTs restrictive. Likely acutely exacerbated by rhino/entero infection. Pulmonology consulted and ordered barrium swallow to evaluate for aspiration. Swallow study was normal.  - recommend further outpatient work up  Cirrhosis with bicytopenia LFTs, CBC stable at baselin. In 06/2017, pt had elevated Mitochondrial M2 Ab consistent w/ PBC. Prior biopsy in 2014, nonspecific. Further work up of Cirrhosis negative A1AT, AFP, ASMA. No h/o excessive alcohol use. Cytopenia work up per hematology showed negative findings in bone marrow biopsy.  - Needs outpatient GI followup  Chronic Fatigue - PT - no follow up recommended  FEN/GI: Regular diet Prophylaxis: SCDs   Disposition: stable for discharge  Subjective:  No acute events overnight. Patient says SOB is improved. No complaints of CP. Eager to go home. Needs to leave by early afternoon to have ride come get her.   Objective: Temp:  [97.4 F (36.3 C)-98.2 F (36.8 C)] 98.1 F (36.7 C) (05/05 2019) Pulse Rate:  [73-86] 79 (05/05 2019) Resp:  [16-20] 16 (05/05 2019) BP: (99-130)/(66-84) 130/84  (05/05 2019) SpO2:  [95 %-99 %] 99 % (05/05 2019) Weight:  [238 lb 15.7 oz (108.4 kg)] 238 lb 15.7 oz (108.4 kg) (05/05 0542) Physical Exam: Gen: NAD, resting comfortably CV: RRR with no murmurs appreciated Pulm: NWOB, basilar, fine crackles  GI: Normal bowel sounds present. Soft, Nontender, Nondistended. MSK: no edema, cyanosis, or clubbing noted Skin: warm, dry Neuro: grossly normal, moves all extremities Psych: Normal affect and thought content  Laboratory: Recent Labs  Lab 09/10/17 2319 09/11/17 0630 09/12/17 0438  WBC 1.4* 1.3* 1.4*  HGB 11.4* 10.9* 11.3*  HCT 34.1* 32.4* 33.6*  PLT 60* 59* 67*   Recent Labs  Lab 09/10/17 1530 09/10/17 2319 09/11/17 0630 09/12/17 0438  NA 142 136 136 135  K 3.9 3.9 3.9 4.1  CL 110* 107 106 107  CO2 20 21* 22 20*  BUN 6 5* <5* 8  CREATININE 0.61 0.70 0.76 0.72  CALCIUM 8.6* 8.7* 8.6* 8.3*  PROT 7.6  --  7.3  --   BILITOT 1.9*  --  2.0*  --   ALKPHOS 136*  --  127*  --   ALT 34*  --  32  --   AST 66*  --  62*  --   GLUCOSE 127* 195* 151* 116*   Bonnita Hollow, MD 09/12/2017, 11:17 PM PGY-1, [place] Intern pager: [phone], text pages welcome

## 2017-09-12 NOTE — Discharge Summary (Signed)
[facility] Discharge Summary  Patient name: Holly Mueller Medical record number: [id number] Date of birth: October 21, 1965 Age: 52 y.o. Gender: female Date of Admission: 09/10/2017  Date of Discharge: 09/13/2017  Admitting Physician: Alveda Reasons, MD  Primary Care Provider: [name], MD Consultants: Cardiology  Indication for Hospitalization: Chest pain with SOB  Discharge Diagnoses/Problem List:  Patient Active Problem List   Diagnosis Date Noted  . Chest pain 09/11/2017  . Ectatic aorta ([place]) 09/11/2017  . Chest pressure 09/11/2017  . Cellulitis of left lower extremity   . Pulmonary edema 06/30/2017  . Left leg pain   . Type 2 diabetes mellitus without complication, without long-term current use of insulin ([place])   . Bibasilar crackles   . Influenza-like illness   . Neutropenic fever ([place]) 06/14/2017  . Cirrhosis of liver without ascites ([place]) 11/17/2016  . Thrombocytopenia ([place]) 11/17/2016  . Leucopenia 11/17/2016  . Bicytopenia 10/24/2016  . Epistaxis 10/23/2016  . GERD (gastroesophageal reflux disease) 10/23/2016  . Cough 12/09/2015  . Chronic pain of left thumb 10/09/2015  . Precordial chest pain 08/03/2015  . Abscess of skin of abdomen 07/10/2014  . Bursitis of left hip 09/14/2013  . Lower esophageal ring 08/18/2013  . DOE (dyspnea on exertion) 08/17/2013  . Ganglion cyst of wrist 08/24/2012  . Hip pain, bilateral 08/23/2012  . Umbilical hernia 71/10/2692  . Healthcare maintenance 02/14/2012  . Osteoarthritis 02/12/2012  . Elevated LFTs 10/30/2011  . Allergic rhinitis 08/26/2011  . Obesity 07/08/2010  . SEIZURES, HX OF 12/12/2009  . DENTAL CARIES 12/18/2008  . DEPRESSION 07/26/2008  . MIGRAINE HEADACHE 11/15/2007  . Hypothyroidism 10/12/2006  . Restrictive lung disease 07/30/2006   Disposition: Home  Discharge Condition: Improved  Discharge Exam:  Gen: NAD, resting comfortably CV: RRR with no murmurs  appreciated Pulm: NWOB, basilar, fine crackles  GI: Normal bowel sounds present. Soft, Nontender, Nondistended. MSK: no edema, cyanosis, or clubbing noted Skin: warm, dry Neuro: grossly normal, moves all extremities Psych: Normal affect and thought content  Brief Hospital Course:  Holly Mueller is a 52 y.o. female presenting with 1 week of worsening chest pain and shortness of breath.   Chest pain Patient chest pain was atypical in nature. Patient had EKG w/o ST changes. Troponins were negative. BNP was not elevated. Cardiology was consulted and performed a Myoview with was normal. Cardiology recommended lifestyle modifications.   Shortness of breath Patient had worsening SOB for past week. She also has chronic lung disease with restrictive pattern on PFTs. She was dyspnic when talking, but maintained normal oxygen saturation on RA. She did not require any oxygen during stay. CTA was negative for PE, but showed chronic lung nodules as seen on previous imaging. Pulmology was consulted and recommended a swallow study to evaluate for chronic aspiration. Barrium swallow was negative. Patient did have RVP positive for rhino/enterovirus. SOB was felt to be acutely exacerbated by URI. Pulmonary recommended further outpatient follow up.   Cirrhosis with bicytopenia Pt has history of liver cirrhosis with stable leukopenia and thrombocytopenia. In 06/2017, pt had elevatedMitochondrial M2 Ab consistent w/ PBC. Prior biopsy in 2014, nonspecific. Further work up of Cirrhosis negative A1AT, AFP, ASMA. No h/o excessive alcohol use. Cytopenia work up per hematology showed negative findings in bone marrow biopsy.   Issues for Follow Up:  1. Patient with multiple endocrine diagnoses and unusual interstitial lung disease, may consider further work up and follow up. 2. Incidental possible thyroid nodule on CTA. Pt  also has very resistant hypothyroidism. Consider further work as needed. 3. Outpt GI follow up for  cirrhosis 4. Outpatient Pulmonology follow up  Significant Procedures:  Nuclear Stress Test - 05/05  Significant Labs and Imaging:  Recent Labs  Lab 09/11/17 0630 09/12/17 0438 09/13/17 0818  WBC 1.3* 1.4* 2.1*  HGB 10.9* 11.3* 12.0  HCT 32.4* 33.6* 35.4*  PLT 59* 67* 68*   Recent Labs  Lab 09/10/17 1530 09/10/17 2319 09/11/17 0630 09/12/17 0438 09/13/17 0818  NA 142 136 136 135 136  K 3.9 3.9 3.9 4.1 4.1  CL 110* 107 106 107 108  CO2 20 21* 22 20* 22  GLUCOSE 127* 195* 151* 116* 122*  BUN 6 5* <5* 8 9  CREATININE 0.61 0.70 0.76 0.72 0.74  CALCIUM 8.6* 8.7* 8.6* 8.3* 8.9  ALKPHOS 136*  --  127*  --   --   AST 66*  --  62*  --   --   ALT 34*  --  32  --   --   ALBUMIN 3.3*  --  2.7*  --   --     Ct Angio Chest Pe W And/or Wo Contrast  Result Date: 09/11/2017 CLINICAL DATA:  52 year old female with chest pain and shortness of breath. Concern for pulmonary embolism. EXAM: CT ANGIOGRAPHY CHEST WITH CONTRAST TECHNIQUE: Multidetector CT imaging of the chest was performed using the standard protocol during bolus administration of intravenous contrast. Multiplanar CT image reconstructions and MIPs were obtained to evaluate the vascular anatomy. CONTRAST:  141m ISOVUE-370 IOPAMIDOL (ISOVUE-370) INJECTION 76% COMPARISON:  Chest radiograph dated 08/04/2017 and CT dated 06/30/2017 FINDINGS: Cardiovascular: There is no cardiomegaly or pericardial effusion. The thoracic aorta is unremarkable. Evaluation of the pulmonary arteries is limited due to respiratory motion artifact and suboptimal opacification of the peripheral branches. No pulmonary artery embolus identified. Mediastinum/Nodes: There is no hilar or mediastinal adenopathy. Esophagus is grossly unremarkable. There is prominence of the left thyroid lobe inferiorly which may represent a nodule. Ultrasound may provide better evaluation of the thyroid gland. No mediastinal fluid collection. Lungs/Pleura: There is shallow inspiration  with bibasilar atelectatic changes. Mild diffuse interstitial prominence may be partly related to atelectatic changes. Scattered confluent areas of ground-glass densities predominantly involving the lung bases as seen previously. No focal consolidative changes. There is no pleural effusion or pneumothorax. The central airways are patent. Upper Abdomen: Cirrhosis and splenomegaly.  Upper abdominal varices. Musculoskeletal: Mild degenerative changes of the spine. No acute osseous pathology. Review of the MIP images confirms the above findings. IMPRESSION: 1. No CT evidence of pulmonary embolism. 2. Diffuse mild interstitial prominence and scattered confluent areas of ground-glass densities similar to prior study. No focal consolidation. 3. Cirrhosis with evidence of portal hypertension. Electronically Signed   By: AAnner CreteM.D.   On: 09/11/2017 01:06    Results/Tests Pending at Time of Discharge:  Unresulted Labs (From admission, onward)   None     Discharge Medications:  Allergies as of 09/13/2017      Reactions   Aspirin Nausea Only   Upset stomach      Medication List    TAKE these medications   ACCU-CHEK AVIVA PLUS test strip Generic drug:  glucose blood USE UP TO 4 TIMES DAILY AS DIRECTED   ACCU-CHEK SOFTCLIX LANCETS lancets USE UP TO 4 TIMES DAILY AS DIRECTED   albuterol (2.5 MG/3ML) 0.083% nebulizer solution Commonly known as:  PROVENTIL Take 3 mLs (2.5 mg total) by nebulization every 6 (six) hours  as needed for wheezing or shortness of breath.   albuterol 108 (90 Base) MCG/ACT inhaler Commonly known as:  PROAIR HFA INHALE 2 PUFFS INTO THE LUNGS EVERY 6 HOURS AS NEEDED FOR SHORTNESS OF BREATH   aspirin 81 MG EC tablet Take 1 tablet (81 mg total) by mouth daily.   blood glucose meter kit and supplies Kit Dispense based on patient and insurance preference. Use up to four times daily as directed. (FOR ICD-9 250.00, 250.01).   budesonide-formoterol 160-4.5 MCG/ACT  inhaler Commonly known as:  SYMBICORT Inhale 2 puffs into the lungs 2 (two) times daily.   cetirizine 10 MG tablet Commonly known as:  ZYRTEC Take 1 tablet (10 mg total) by mouth daily.   fluticasone 50 MCG/ACT nasal spray Commonly known as:  FLONASE Place 2 sprays into both nostrils daily.   levothyroxine 125 MCG tablet Commonly known as:  SYNTHROID, LEVOTHROID TAKE 2 TABLETS (250 MCG TOTAL) BY MOUTH DAILY.   metFORMIN 500 MG tablet Commonly known as:  GLUCOPHAGE Take 1 tablet (500 mg total) by mouth 2 (two) times daily with a meal.   pantoprazole 40 MG tablet Commonly known as:  PROTONIX TAKE 1 TABLET BY MOUTH DAILY. What changed:  Another medication with the same name was removed. Continue taking this medication, and follow the directions you see here.   promethazine-dextromethorphan 6.25-15 MG/5ML syrup Commonly known as:  PROMETHAZINE-DM Take 5 mLs by mouth 4 (four) times daily as needed for cough.       Discharge Instructions: Please refer to Patient Instructions section of EMR for full details.  Patient was counseled important signs and symptoms that should prompt return to medical care, changes in medications, dietary instructions, activity restrictions, and follow up appointments.   Follow-Up Appointments: Follow-up Information    Carlyle Dolly, MD. Go on 09/17/2017.   Specialty:  Family Medicine Why:  8:30AM Contact information: [place] [id number] [phone]        Marshell Garfinkel, MD. Schedule an appointment as soon as possible for a visit in 2 week(s).   Specialty:  Pulmonary Disease Contact information: [address] [place] [address] [phone]           Bonnita Hollow, MD 09/14/2017, 3:06 PM PGY-1, [place]

## 2017-09-12 NOTE — Progress Notes (Signed)
Pt npo since midnight, pt to stress test via wheelchair in stable condition

## 2017-09-12 NOTE — Progress Notes (Signed)
Pt has returned from stress test and barium swallow paged MD to advise re diet order

## 2017-09-13 DIAGNOSIS — R0609 Other forms of dyspnea: Secondary | ICD-10-CM | POA: Diagnosis not present

## 2017-09-13 DIAGNOSIS — D709 Neutropenia, unspecified: Secondary | ICD-10-CM | POA: Diagnosis not present

## 2017-09-13 DIAGNOSIS — R079 Chest pain, unspecified: Secondary | ICD-10-CM | POA: Diagnosis not present

## 2017-09-13 LAB — GLUCOSE, CAPILLARY: Glucose-Capillary: 116 mg/dL — ABNORMAL HIGH (ref 65–99)

## 2017-09-13 LAB — MISC LABCORP TEST (SEND OUT): Labcorp test code: 164962

## 2017-09-13 LAB — CBC
HEMATOCRIT: 35.4 % — AB (ref 36.0–46.0)
Hemoglobin: 12 g/dL (ref 12.0–15.0)
MCH: 29.2 pg (ref 26.0–34.0)
MCHC: 33.9 g/dL (ref 30.0–36.0)
MCV: 86.1 fL (ref 78.0–100.0)
Platelets: 68 10*3/uL — ABNORMAL LOW (ref 150–400)
RBC: 4.11 MIL/uL (ref 3.87–5.11)
RDW: 14.3 % (ref 11.5–15.5)
WBC: 2.1 10*3/uL — ABNORMAL LOW (ref 4.0–10.5)

## 2017-09-13 LAB — BASIC METABOLIC PANEL
Anion gap: 6 (ref 5–15)
BUN: 9 mg/dL (ref 6–20)
CALCIUM: 8.9 mg/dL (ref 8.9–10.3)
CHLORIDE: 108 mmol/L (ref 101–111)
CO2: 22 mmol/L (ref 22–32)
CREATININE: 0.74 mg/dL (ref 0.44–1.00)
GFR calc non Af Amer: >60 mL/min (ref >60)
Glucose, Bld: 122 mg/dL — ABNORMAL HIGH (ref 65–99)
Potassium: 4.1 mmol/L (ref 3.5–5.1)
Sodium: 136 mmol/L (ref 135–145)

## 2017-09-13 LAB — ANTI-SCLERODERMA ANTIBODY: Scleroderma (Scl-70) (ENA) Antibody, IgG: 0.2 AI (ref 0.0–0.9)

## 2017-09-13 MED ORDER — ASPIRIN 81 MG PO TBEC: 81.0000 mg | DELAYED_RELEASE_TABLET | Freq: Every day | ORAL | 0 refills | Status: DC

## 2017-09-13 NOTE — Progress Notes (Signed)
Progress Note  Patient Name: Holly Mueller Date of Encounter: 09/13/2017  Primary Cardiologist: Sanda Klein, MD   Subjective   52 year old female with a history of morbid obesity, cirrhosis, diabetes mellitus, asthma, depression she was admitted with atypical chest pain.  Lexiscan Myoview study performed on May 5 revealed no evidence of ischemia and was of low risk Myoview study.  Not having any further episodes of chest discomfort.  Levels are all negative.  BNP is 21.2.  EKG is unremarkable.  Inpatient Medications    Scheduled Meds: . aspirin EC  81 mg Oral Daily  . fluticasone  2 spray Each Nare Daily  . insulin aspart  0-9 Units Subcutaneous TID WC  . levothyroxine  250 mcg Oral QAC breakfast  . loratadine  10 mg Oral Daily  . pantoprazole  40 mg Oral Daily   Continuous Infusions:  PRN Meds: acetaminophen **OR** acetaminophen, albuterol, gi cocktail, polyethylene glycol   Vital Signs    Vitals:   09/12/17 0923 09/12/17 1107 09/12/17 2019 09/13/17 0547  BP: 119/76 110/73 130/84 95/63  Pulse: 78 73 79 80  Resp:  _0 Temp:  (!) 97.4 F (36.3 C) 98.1 F (36.7 C) 98.2 F (36.8 C)  TempSrc:  Oral Oral Oral  SpO2:  95% 99% 95%  Weight:    237 lb 3.2 oz (107.6 kg)  Height:        Intake/Output Summary (Last 24 hours) at 09/13/2017 1011 Last data filed at 09/13/2017 0849 Gross per 24 hour  Intake 2080 ml  Output 3450 ml  Net -1370 ml   Filed Weights   09/11/17 0438 09/12/17 0542 09/13/17 0547  Weight: 240 lb 9.6 oz (109.1 kg) 238 lb 15.7 oz (108.4 kg) 237 lb 3.2 oz (107.6 kg)    Telemetry    NSR  - Personally Reviewed  ECG     NSR  - Personally Reviewed  Physical Exam   GEN:  Middle-aged, morbidly obese female, no acute distress. Neck: No JVD Cardiac: RRR, no murmurs, rubs, or gallops.  Respiratory: Clear to auscultation bilaterally. GI: Soft, nontender, non-distended  MS: No edema; No deformity. Neuro:  Nonfocal  Psych: Normal affect    Labs    Chemistry Recent Labs  Lab 09/10/17 1530  09/11/17 0630 09/12/17 0438 09/13/17 0818  NA 142   < > 136 135 136  K 3.9   < > 3.9 4.1 4.1  CL 110*   < > 106 107 108  CO2 20   < > 22 20* 22  GLUCOSE 127*   < > 151* 116* 122*  BUN 6   < > <5* 8 9  CREATININE 0.61   < > 0.76 0.72 0.74  CALCIUM 8.6*   < > 8.6* 8.3* 8.9  PROT 7.6  --  7.3  --   --   ALBUMIN 3.3*  --  2.7*  --   --   AST 66*  --  62*  --   --   ALT 34*  --  32  --   --   ALKPHOS 136*  --  127*  --   --   BILITOT 1.9*  --  2.0*  --   --   GFRNONAA 105   < > >60 >60 >60  GFRAA 121   < > >60 >60 >60  ANIONGAP  --    < > _1 < > = values in this interval not displayed.  Hematology Recent Labs  Lab 09/11/17 0630 09/12/17 0438 09/13/17 0818  WBC 1.3* 1.4* 2.1*  RBC 3.76* 3.83* 4.11  HGB 10.9* 11.3* 12.0  HCT 32.4* 33.6* 35.4*  MCV 86.2 87.7 86.1  MCH 29.0 29.5 29.2  MCHC 33.6 33.6 33.9  RDW 14.4 14.5 14.3  PLT 59* 67* 68*    Cardiac Enzymes Recent Labs  Lab 09/11/17 0304 09/11/17 0626 09/11/17 1414  TROPONINI <0.03 <0.03 <0.03    Recent Labs  Lab 09/10/17 2323  TROPIPOC 0.00     BNP Recent Labs  Lab 09/10/17 2319 09/11/17 1823  BNP 45.3 21.2     DDimer  Recent Labs  Lab 09/10/17 2319  DDIMER 0.67*     Radiology    Nm Myocar Multi W/spect W/wall Motion / Ef  Result Date: 09/12/2017  Probable normal perfusion and minimal soft tissue attenuation (breast) No significant ischemia or scar  Nuclear stress EF: 57%.  This is a low risk study.    Dg Swallowing Func-speech Pathology  Result Date: 09/12/2017 Objective Swallowing Evaluation: Type of Study: MBS-Modified Barium Swallow Study  Patient Details Name: Holly Mueller MRN: [id number] Date of Birth: Feb 05, 1966 Today's Date: 09/12/2017 Time: SLP Start Time (ACUTE ONLY): 70 -SLP Stop Time (ACUTE ONLY): 1136 SLP Time Calculation (min) (ACUTE ONLY): 21 min Past Medical History: Past Medical History: Diagnosis Date .  Arthritis   bursitis left hip flares-not an issue . Edentulous   10-19-13 at present . Epilepsy ([place]) in 1972  No seizures since 1972. Previously treated with phenobarbital.  . Fever 06/2017 . GERD (gastroesophageal reflux disease) 2008 . Headache(784.0)   hx of migraines  . Hypothyroidism 2008 . Lower esophageal ring 08/18/2013 . Seasonal allergies 2003 . Shortness of breath  Past Surgical History: Past Surgical History: Procedure Laterality Date . BALLOON DILATION N/A 10/24/2013  Procedure: BALLOON DILATION;  Surgeon: Gatha Mayer, MD;  Location: [facility];  Service: Endoscopy;  Laterality: N/A; . CESAREAN SECTION    x2 . DENTAL SURGERY    multiple extractions 3'15 . ESOPHAGOGASTRODUODENOSCOPY N/A 10/24/2013  Procedure: ESOPHAGOGASTRODUODENOSCOPY (EGD);  Surgeon: Gatha Mayer, MD;  Location: [facility];  Service: Endoscopy;  Laterality: N/A; . LIVER BIOPSY  06/30/2012  Procedure: LIVER BIOPSY;  Surgeon: Shann Medal, MD;  Location: [facility];  Service: General;; . NOVASURE ABLATION   . SHOULDER ARTHROSCOPY Right 2011 . UMBILICAL HERNIA REPAIR N/A 06/30/2012  Procedure: remove umbilicus;  Surgeon: Shann Medal, MD;  Location: [facility];  Service: General;  Laterality: N/A; . VENTRAL HERNIA REPAIR N/A 06/30/2012  Procedure: LAPAROSCOPIC VENTRAL HERNIA;  Surgeon: Shann Medal, MD;  Location: [facility];  Service: General;  Laterality: N/A;  With Mesh . WISDOM TOOTH EXTRACTION   HPI: 52 y.o. female presenting with 1 week of worsening chest pain and shortness of breath . PMH is significant for hypothyroidism, depression, DMII, migraines, seizures, obesity, cirrhosis, Bicytopenia , GERD, ?asthma, HFpEF G1DD. Admitted with chest pain and SOB of unclear etiology. Dx with rhinovirus. Patient endorses h/o dysphagia and esophageal strictures. Ground glass opacities on lung imaging: suspecting recurrent aspiration.  No data recorded Assessment / Plan / Recommendation CHL IP CLINICAL IMPRESSIONS 09/12/2017 Clinical Impression  Patient presents with a normal oropharyngeal swallow with full airway protection. No  SLP f/u indicated. Cannot r/o post prandial aspiration given h/o esoophageal strictures although patient does not endorse recent difficulty s/p most recetn dilitation.   SLP Visit Diagnosis Dysphagia, unspecified (R13.10) Attention and concentration deficit following -- Frontal lobe and  executive function deficit following -- Impact on safety and function No limitations   CHL IP TREATMENT RECOMMENDATION 09/12/2017 Treatment Recommendations No treatment recommended at this time   No flowsheet data found. CHL IP DIET RECOMMENDATION 09/12/2017 SLP Diet Recommendations Regular solids;Thin liquid Liquid Administration via Cup;Straw Medication Administration Whole meds with liquid Compensations Small sips/bites;Slow rate Postural Changes Seated upright at 90 degrees;Remain semi-upright after after feeds/meals (Comment)   CHL IP OTHER RECOMMENDATIONS 09/12/2017 Recommended Consults -- Oral Care Recommendations Oral care BID Other Recommendations --   CHL IP FOLLOW UP RECOMMENDATIONS 09/12/2017 Follow up Recommendations None   No flowsheet data found.     CHL IP ORAL PHASE 09/12/2017 Oral Phase WFL Oral - Pudding Teaspoon -- Oral - Pudding Cup -- Oral - Honey Teaspoon -- Oral - Honey Cup -- Oral - Nectar Teaspoon -- Oral - Nectar Cup -- Oral - Nectar Straw -- Oral - Thin Teaspoon -- Oral - Thin Cup -- Oral - Thin Straw -- Oral - Puree -- Oral - Mech Soft -- Oral - Regular -- Oral - Multi-Consistency -- Oral - Pill -- Oral Phase - Comment --  CHL IP PHARYNGEAL PHASE 09/12/2017 Pharyngeal Phase WFL Pharyngeal- Pudding Teaspoon -- Pharyngeal -- Pharyngeal- Pudding Cup -- Pharyngeal -- Pharyngeal- Honey Teaspoon -- Pharyngeal -- Pharyngeal- Honey Cup -- Pharyngeal -- Pharyngeal- Nectar Teaspoon -- Pharyngeal -- Pharyngeal- Nectar Cup -- Pharyngeal -- Pharyngeal- Nectar Straw -- Pharyngeal -- Pharyngeal- Thin Teaspoon -- Pharyngeal -- Pharyngeal- Thin Cup  -- Pharyngeal -- Pharyngeal- Thin Straw -- Pharyngeal -- Pharyngeal- Puree -- Pharyngeal -- Pharyngeal- Mechanical Soft -- Pharyngeal -- Pharyngeal- Regular -- Pharyngeal -- Pharyngeal- Multi-consistency -- Pharyngeal -- Pharyngeal- Pill -- Pharyngeal -- Pharyngeal Comment --  CHL IP CERVICAL ESOPHAGEAL PHASE 09/12/2017 Cervical Esophageal Phase WFL Pudding Teaspoon -- Pudding Cup -- Honey Teaspoon -- Honey Cup -- Nectar Teaspoon -- Nectar Cup -- Nectar Straw -- Thin Teaspoon -- Thin Cup -- Thin Straw -- Puree -- Mechanical Soft -- Regular -- Multi-consistency -- Pill -- Cervical Esophageal Comment -- Gabriel Rainwater MA, CCC-SLP [phone] [place] Leah Meryl 09/12/2017, 11:39 AM               Cardiac Studies   Lexiscan myoview;  Probable normal perfusion and minimal soft tissue attenuation (breast) No significant ischemia or scar  Nuclear stress EF: 57%.  This is a low risk study  Patient Profile     52 y.o. female admitted with atypical chest pain.  Stress Myoview study was low risk.  Assessment & Plan    1.  Atypical chest pain: Patient will well for myocardial infarction.  Her EKG shows no ST or T wave changes.  Her stress Myoview study is normal.  This point she does not need any further cardiac evaluation.  She needs significant lifestyle modification.  She will follow-up with her primary medical doctor.  Will sign off.  Please call for questions  For questions or updates, please contact [place] Please consult [email or web address] for contact info under Cardiology/STEMI.      Signed, Mertie Moores, MD  09/13/2017, 10:11 AM

## 2017-09-13 NOTE — Progress Notes (Signed)
Patient verbalized understanding of discharge instructions including follow up appointments and when to notify the MD.

## 2017-09-13 NOTE — Discharge Instructions (Signed)
Nonspecific Chest Pain Chest pain can be caused by many different conditions. There is a chance that your pain could be related to something serious, such as a heart attack or a blood clot in your lungs. Chest pain can also be caused by conditions that are not life-threatening. If you have chest pain, it is very important to follow up with your doctor. Follow these instructions at home: Medicines  If you were prescribed an antibiotic medicine, take it as told by your doctor. Do not stop taking the antibiotic even if you start to feel better.  Take over-the-counter and prescription medicines only as told by your doctor. Lifestyle  Do not use any products that contain nicotine or tobacco, such as cigarettes and e-cigarettes. If you need help quitting, ask your doctor.  Do not drink alcohol.  Make lifestyle changes as told by your doctor. These may include: ? Getting regular exercise. Ask your doctor for some activities that are safe for you. ? Eating a heart-healthy diet. A diet specialist (dietitian) can help you to learn healthy eating options. ? Staying at a healthy weight. ? Managing diabetes, if needed. ? Lowering your stress, as with deep breathing or spending time in nature. General instructions  Avoid any activities that make you feel chest pain.  If your chest pain is because of heartburn: ? Raise (elevate) the head of your bed about 6 inches (15 cm). You can do this by putting blocks under the bed legs at the head of the bed. ? Do not sleep with extra pillows under your head. That does not help heartburn.  Keep all follow-up visits as told by your doctor. This is important. This includes any further testing if your chest pain does not go away. Contact a doctor if:  Your chest pain does not go away.  You have a rash with blisters on your chest.  You have a fever.  You have chills. Get help right away if:  Your chest pain is worse.  You have a cough that gets worse, or  you cough up blood.  You have very bad (severe) pain in your belly (abdomen).  You are very weak.  You pass out (faint).  You have either of these for no clear reason: ? Sudden chest discomfort. ? Sudden discomfort in your arms, back, neck, or jaw.  You have shortness of breath at any time.  You suddenly start to sweat, or your skin gets clammy.  You feel sick to your stomach (nauseous).  You throw up (vomit).  You suddenly feel light-headed or dizzy.  Your heart starts to beat fast, or it feels like it is skipping beats. These symptoms may be an emergency. Do not wait to see if the symptoms will go away. Get medical help right away. Call your local emergency services (911 in the [place] Do not drive yourself to the hospital. This information is not intended to replace advice given to you by your health care provider. Make sure you discuss any questions you have with your health care provider. Document Released: 10/14/2007 Document Revised: 01/20/2016 Document Reviewed: 01/20/2016 Elsevier Interactive Patient Education  2017 [employer].

## 2017-09-14 ENCOUNTER — Telehealth: Payer: Self-pay | Admitting: Pulmonary Disease

## 2017-09-14 ENCOUNTER — Ambulatory Visit (HOSPITAL_COMMUNITY): Payer: Medicaid Other

## 2017-09-14 LAB — ENA+DNA/DS+SJORGEN'S
SSA (Ro) (ENA) Antibody, IgG: >8 AI — ABNORMAL HIGH (ref 0.0–0.9)
ds DNA Ab: 2 IU/mL (ref 0–9)

## 2017-09-14 LAB — ANTINUCLEAR ANTIBODIES, IFA: ANTINUCLEAR ANTIBODIES, IFA: NEGATIVE

## 2017-09-14 LAB — ANA W/REFLEX: Anti Nuclear Antibody(ANA): POSITIVE — AB

## 2017-09-14 NOTE — Telephone Encounter (Signed)
lmtcb x1 for pt.  Please see other telephone encounter dated as 09/14/17.

## 2017-09-14 NOTE — Telephone Encounter (Signed)
Please set up a follow up appointment with Dr. Vaughan Browner to be seen to review her autoimmune labs.  She will also need a referral to Rheumatology.    Please call me if you have any questions.   Noe Gens, NP-C [facility] Pgr: [phone] or if no answer [phone] 09/14/2017, 3:42 PM

## 2017-09-14 NOTE — Telephone Encounter (Signed)
ATC pt, no answer. Left message for pt to call back.  

## 2017-09-15 ENCOUNTER — Encounter (HOSPITAL_COMMUNITY): Payer: Medicaid Other

## 2017-09-15 ENCOUNTER — Encounter: Payer: Self-pay | Admitting: Family Medicine

## 2017-09-15 ENCOUNTER — Ambulatory Visit (HOSPITAL_COMMUNITY): Payer: Medicaid Other

## 2017-09-15 ENCOUNTER — Ambulatory Visit: Payer: Medicaid Other | Admitting: Adult Health

## 2017-09-15 ENCOUNTER — Ambulatory Visit (INDEPENDENT_AMBULATORY_CARE_PROVIDER_SITE_OTHER): Payer: Medicaid Other | Admitting: Family Medicine

## 2017-09-15 DIAGNOSIS — M25551 Pain in right hip: Secondary | ICD-10-CM

## 2017-09-15 MED ORDER — METHYLPREDNISOLONE ACETATE 80 MG/ML IJ SUSP
80.0000 mg | Freq: Once | INTRAMUSCULAR | Status: AC
  Administered 2017-09-15: 80 mg via INTRA_ARTICULAR

## 2017-09-15 NOTE — Patient Instructions (Signed)
You have IT band syndrome with trochanteric bursitis. Avoid painful activities as much as possible. Ice over area of pain 3-4 times a day for 15 minutes at a time Standing hip rotations and hip side raise exercise 3 sets of 10 once a day - add weights if this becomes too easy. Stretches - pick 2-3 and hold for 20-30 seconds x 3 - do once or twice a day. Tylenol as needed for pain. You were given an injection today. If not improving, can consider physical therapy. Follow up with me in 6 weeks.

## 2017-09-15 NOTE — Telephone Encounter (Signed)
Spoke with pt and advised her that Velna Hatchet wanted her to make an appt with Dr. Vaughan Browner. I called pt and set up an appt on 5/29 at 3:45pm. Nothing further is needed.    Please set up a follow up appointment with Dr. Vaughan Browner to be seen to review her autoimmune labs.  She will also need a referral to Rheumatology.    Please call me if you have any questions.   Noe Gens, NP-C [facility] Pgr: [phone] or if no answer [phone] 09/14/2017, 3:42 PM

## 2017-09-16 ENCOUNTER — Encounter: Payer: Self-pay | Admitting: Family Medicine

## 2017-09-16 NOTE — Assessment & Plan Note (Signed)
2/2 IT band syndrome with trochanteric bursitis.  Icing, tylenol if needed.  Bursa injection given today.  F/u in 6 weeks.  Consider physical therapy.  Shown home exercises and stretches to do daily.  After informed written consent timeout was performed, patient was lying on left side on exam table.  Area overlying right trochanteric bursa prepped with alcohol swab then injected with 6:1 marcaine: depomedrol.  Patient tolerated procedure well without immediate complications.

## 2017-09-16 NOTE — Progress Notes (Deleted)
Subjective:    Patient ID: Holly Mueller , female   DOB: 11-18-1965 , 52 y.o..   MRN: [id number]  HPI  Holly Mueller is a 52 yo F with PMH of hypothyroidism, depression, DMII, migraines, H/o seizures, obesity, cirrhosis, Bicytopenia , GERD, RLD, HFpEF G1DD here for No chief complaint on file.   1. Follow up outpatient visit after Hospitalization  Holly Mueller is {Pc accompanied by:5710} Sources of clinical information for visit is/are {Information source:60032}. The Discharge Summary for the hospitalization from 09/10/17 to 09/13/17 was reviewed.  Nursing assessment for this office visit was reviewed with the patient for accuracy and revision.   HPI Principle Diagnosis requiring hospitalization: chest pain with dyspnea Brief Hospital course summary: see below  Chest pain Patient chest pain was atypical in nature. Patient had EKG w/o ST changes. Troponins were negative. BNP was not elevated. Cardiology was consulted and performed a Myoview with was normal. Cardiology recommended lifestyle modifications.   Shortness of breath Patient had worsening SOB for past week. She also has chronic lung disease with restrictive pattern on PFTs. She was dyspnic when talking, but maintained normal oxygen saturation on RA. She did not require any oxygen during stay. CTA was negative for PE, but showed chronic lung nodules as seen on previous imaging. Pulmology was consulted and recommended a swallow study to evaluate for chronic aspiration. Barrium swallow was negative. Patient did have RVP positive for rhino/enterovirus. SOB was felt to be acutely exacerbated by URI. Pulmonary recommended further outpatient follow up.   Cirrhosis with bicytopenia Pt has history of liver cirrhosis with stable leukopenia and thrombocytopenia. In 06/2017, pt had elevatedMitochondrial M2 Ab consistent w/PBC.Prior biopsy in 2014, nonspecific.Further work up of Cirrhosis negative A1AT, AFP, ASMA. No h/o excessive  alcohol use. Cytopenia work up per hematology showed negative findings in bone marrow biopsy. Follow up instructions from patient's hospital healthcare providers:   Issues for Follow Up:  1. Patient with multiple endocrine diagnoses and unusual interstitial lung disease, may consider further work up and follow up. 2. Incidental possible thyroid nodule on CTA. Pt also has very resistant hypothyroidism. Consider further work as needed. 3. Outpt GI follow up for cirrhosis 4. Outpatient Pulmonology follow up  ---------------------------------------------------------------------------------------------------------------------- Problems since hospital discharge:  Onset: *** Duration: *** Alleviating factors: *** Aggravating Factors: *** Severity or functional limitations: *** Associated symptoms: *** Quality: *** Pattern: *** Course: *** Location: *** Radiation: *** Context: ***  ---------------------------------------------------------------------------------------------------------------------- Follow up appointments with specialists:  Pending: *** Completed: *** ---------------------------------------------------------------------------------------------------------------------- New medications started during hospitalization: *** Chronic medications stopped during hospitalization: *** Patient's Medication List was updated in the EMR: {YES/NO/WILD CARDS:18581} --------------------------------------------------------------------------------------------------------------------- Home Health Services: *** Durable Medical Equipment: *** --------------------------------------------------------------------------------------------------------------------- ADLs [place]     Manage Medications     Manage the telephone       Shopping for food, clothes, Meds, etc     Use transportation     [place]: Per HPI. All other systems reviewed and are negative.  Past Medical History: Patient Active Problem List   Diagnosis Date Noted  . Chest pain 09/11/2017  . Ectatic aorta ([place]) 09/11/2017  . Chest pressure 09/11/2017  . Cellulitis of left lower extremity   .  Pulmonary edema 06/30/2017  . Left leg pain   . Type 2 diabetes mellitus without complication, without long-term current use of insulin ([place])   . Bibasilar crackles   . Influenza-like illness   . Neutropenic fever ([place]) 06/14/2017  . Cirrhosis of liver without ascites ([place]) 11/17/2016  . Thrombocytopenia ([place]) 11/17/2016  . Leucopenia 11/17/2016  . Bicytopenia 10/24/2016  . Epistaxis 10/23/2016  . GERD (gastroesophageal reflux disease) 10/23/2016  . Cough 12/09/2015  . Chronic pain of left thumb 10/09/2015  . Precordial chest pain 08/03/2015  . Abscess of skin of abdomen 07/10/2014  . Bursitis of left hip 09/14/2013  . Lower esophageal ring 08/18/2013  . DOE (dyspnea on exertion) 08/17/2013  . Ganglion cyst of wrist 08/24/2012  . Hip pain, bilateral 08/23/2012  . Umbilical hernia 16/02/9603  . Healthcare maintenance 02/14/2012  . Osteoarthritis 02/12/2012  . Elevated LFTs 10/30/2011  . Allergic rhinitis 08/26/2011  . Obesity 07/08/2010  . SEIZURES, HX OF 12/12/2009  . DENTAL CARIES 12/18/2008  . DEPRESSION 07/26/2008  . MIGRAINE HEADACHE 11/15/2007  . Hypothyroidism 10/12/2006  . Restrictive lung disease 07/30/2006    Medications: reviewed and updated Current Outpatient Medications  Medication Sig Dispense Refill  . ACCU-CHEK AVIVA PLUS test strip USE UP TO 4 TIMES DAILY AS DIRECTED 100 each 0  . ACCU-CHEK SOFTCLIX LANCETS lancets USE UP TO 4 TIMES DAILY AS DIRECTED 100 each 0  . albuterol (PROAIR HFA) 108 (90 Base) MCG/ACT inhaler INHALE 2 PUFFS INTO THE LUNGS EVERY 6 HOURS AS NEEDED FOR SHORTNESS OF  BREATH 8.5 g 5  . albuterol (PROVENTIL) (2.5 MG/3ML) 0.083% nebulizer solution Take 3 mLs (2.5 mg total) by nebulization every 6 (six) hours as needed for wheezing or shortness of breath. 75 mL 2  . aspirin 81 MG EC tablet Take 1 tablet (81 mg total) by mouth daily. 30 tablet 0  . blood glucose meter kit and supplies KIT Dispense based on patient and insurance preference. Use up to four times daily as directed. (FOR ICD-9 250.00, 250.01). 1 each 0  . budesonide-formoterol (SYMBICORT) 160-4.5 MCG/ACT inhaler Inhale 2 puffs into the lungs 2 (two) times daily. (Patient not taking: Reported on 09/10/2017) 1 Inhaler 12  . cetirizine (ZYRTEC) 10 MG tablet Take 1 tablet (10 mg total) by mouth daily. 30 tablet 11  . fluticasone (FLONASE) 50 MCG/ACT nasal spray Place 2 sprays into both nostrils daily.    Marland Kitchen levothyroxine (SYNTHROID, LEVOTHROID) 125 MCG tablet TAKE 2 TABLETS (250 MCG TOTAL) BY MOUTH DAILY. 60 tablet 0  . metFORMIN (GLUCOPHAGE) 500 MG tablet Take 1 tablet (500 mg total) by mouth 2 (two) times daily with a meal. 180 tablet 0  . pantoprazole (PROTONIX) 40 MG tablet TAKE 1 TABLET BY MOUTH DAILY. 30 tablet 3  . promethazine-dextromethorphan (PROMETHAZINE-DM) 6.25-15 MG/5ML syrup Take 5 mLs by mouth 4 (four) times daily as needed for cough. (Patient not taking: Reported on 09/10/2017) 120 mL 0   No current facility-administered medications for this visit.     Social Hx:  reports that she quit smoking about 22 years ago. Her smoking use included cigarettes. She smoked 0.25 packs per day. She has never used smokeless tobacco.   Objective:   There were no vitals taken for this visit. Physical Exam  Gen: NAD, alert, cooperative with exam, well-appearing HEENT: NCAT, PERRL, clear conjunctiva, oropharynx clear, supple neck Cardiac: Regular rate and rhythm, normal S1/S2, no murmur, no edema, capillary refill brisk  Respiratory: Clear to auscultation bilaterally, no wheezes,  non-labored  breathing Gastrointestinal: soft, non tender, non distended, bowel sounds present Skin: no rashes, normal turgor  Neurological: no gross deficits.  Psych: good insight, normal mood and affect  Assessment & Plan:  No problem-specific Assessment & Plan notes found for this encounter.  No orders of the defined types were placed in this encounter.  No orders of the defined types were placed in this encounter.    , MD [facility], PGY-3  

## 2017-09-16 NOTE — Progress Notes (Signed)
PCP: [name], MD  Subjective:   HPI: Patient is a 52 y.o. female here for right hip pain.  Patient reports for about 2 months she's had lateral right hip pain. Pain worse with lying on right side, sharp. Worse with stairs. Has tried ibuprofen, tylenol, aleve, leg extensions without much benefit. No back pain, radiation into leg. No numbness, bowel/bladder dysfunction.  Past Medical History:  Diagnosis Date  . Arthritis    bursitis left hip flares-not an issue  . Edentulous    10-19-13 at present  . Epilepsy ([place]) in 1972   No seizures since 1972. Previously treated with phenobarbital.   . Fever 06/2017  . GERD (gastroesophageal reflux disease) 2008  . Headache(784.0)    hx of migraines   . Hypothyroidism 2008  . Lower esophageal ring 08/18/2013  . Seasonal allergies 2003  . Shortness of breath     Current Outpatient Medications on File Prior to Visit  Medication Sig Dispense Refill  . ACCU-CHEK AVIVA PLUS test strip USE UP TO 4 TIMES DAILY AS DIRECTED 100 each 0  . ACCU-CHEK SOFTCLIX LANCETS lancets USE UP TO 4 TIMES DAILY AS DIRECTED 100 each 0  . albuterol (PROAIR HFA) 108 (90 Base) MCG/ACT inhaler INHALE 2 PUFFS INTO THE LUNGS EVERY 6 HOURS AS NEEDED FOR SHORTNESS OF BREATH 8.5 g 5  . albuterol (PROVENTIL) (2.5 MG/3ML) 0.083% nebulizer solution Take 3 mLs (2.5 mg total) by nebulization every 6 (six) hours as needed for wheezing or shortness of breath. 75 mL 2  . aspirin 81 MG EC tablet Take 1 tablet (81 mg total) by mouth daily. 30 tablet 0  . blood glucose meter kit and supplies KIT Dispense based on patient and insurance preference. Use up to four times daily as directed. (FOR ICD-9 250.00, 250.01). 1 each 0  . budesonide-formoterol (SYMBICORT) 160-4.5 MCG/ACT inhaler Inhale 2 puffs into the lungs 2 (two) times daily. (Patient not taking: Reported on 09/10/2017) 1 Inhaler 12  . cetirizine (ZYRTEC) 10 MG tablet Take 1 tablet (10 mg total) by mouth daily. 30 tablet  11  . fluticasone (FLONASE) 50 MCG/ACT nasal spray Place 2 sprays into both nostrils daily.    Marland Kitchen levothyroxine (SYNTHROID, LEVOTHROID) 125 MCG tablet TAKE 2 TABLETS (250 MCG TOTAL) BY MOUTH DAILY. 60 tablet 0  . metFORMIN (GLUCOPHAGE) 500 MG tablet Take 1 tablet (500 mg total) by mouth 2 (two) times daily with a meal. 180 tablet 0  . pantoprazole (PROTONIX) 40 MG tablet TAKE 1 TABLET BY MOUTH DAILY. 30 tablet 3  . promethazine-dextromethorphan (PROMETHAZINE-DM) 6.25-15 MG/5ML syrup Take 5 mLs by mouth 4 (four) times daily as needed for cough. (Patient not taking: Reported on 09/10/2017) 120 mL 0   No current facility-administered medications on file prior to visit.     Past Surgical History:  Procedure Laterality Date  . BALLOON DILATION N/A 10/24/2013   Procedure: BALLOON DILATION;  Surgeon: Gatha Mayer, MD;  Location: [facility]COPY;  Service: Endoscopy;  Laterality: N/A;  . CESAREAN SECTION     x2  . DENTAL SURGERY     multiple extractions 3'15  . ESOPHAGOGASTRODUODENOSCOPY N/A 10/24/2013   Procedure: ESOPHAGOGASTRODUODENOSCOPY (EGD);  Surgeon: Gatha Mayer, MD;  Location: [facility];  Service: Endoscopy;  Laterality: N/A;  . LIVER BIOPSY  06/30/2012   Procedure: LIVER BIOPSY;  Surgeon: Shann Medal, MD;  Location: [facility]S;  Service: General;;  . NOVASURE ABLATION    . SHOULDER ARTHROSCOPY Right 2011  . UMBILICAL HERNIA  REPAIR N/A 06/30/2012   Procedure: remove umbilicus;  Surgeon: Shann Medal, MD;  Location: [facility];  Service: General;  Laterality: N/A;  . VENTRAL HERNIA REPAIR N/A 06/30/2012   Procedure: LAPAROSCOPIC VENTRAL HERNIA;  Surgeon: Shann Medal, MD;  Location: [facility];  Service: General;  Laterality: N/A;  With Mesh  . WISDOM TOOTH EXTRACTION      Allergies  Allergen Reactions  . Aspirin Nausea Only    Upset stomach    Social History   Socioeconomic History  . Marital status: Single    Spouse name: Not on file  . Number of children: 2  . Years of  education: Not on file  . Highest education level: Not on file  Occupational History    Employer: [employer]  . Financial resource strain: Not on file  . Food insecurity:    Worry: Not on file    Inability: Not on file  . Transportation needs:    Medical: Not on file    Non-medical: Not on file  Tobacco Use  . Smoking status: Former Smoker    Packs/day: 0.25    Types: Cigarettes    Last attempt to quit: 1997    Years since quitting: 22.3  . Smokeless tobacco: Never Used  Substance and Sexual Activity  . Alcohol use: Yes    Alcohol/week: 0.0 oz    Comment: occ  . Drug use: No  . Sexual activity: Not Currently  Lifestyle  . Physical activity:    Days per week: Not on file    Minutes per session: Not on file  . Stress: Not on file  Relationships  . Social connections:    Talks on phone: Not on file    Gets together: Not on file    Attends religious service: Not on file    Active member of club or organization: Not on file    Attends meetings of clubs or organizations: Not on file    Relationship status: Not on file  . Intimate partner violence:    Fear of current or ex partner: Not on file    Emotionally abused: Not on file    Physically abused: Not on file    Forced sexual activity: Not on file  Other Topics Concern  . Not on file  Social History Narrative   Works in Estate manager/land agent at [employer]    Lives with 12 and 14 (boys).    Also living with close friends of the family (mother and two kids, adults).           Family History  Problem Relation Age of Onset  . Hypertension Mother   . Diabetes Mother   . Lung cancer Father   . Stroke Father   . Diabetes Sister   . Hypertension Sister     BP 128/80   Ht _0  (1.702 m)   Wt 237 lb (107.5 kg)   BMI 37.12 kg/m   Review of Systems: See HPI above.     Objective:  Physical Exam:  Gen: NAD, comfortable in exam room  Back: No gross deformity, scoliosis. No TTP .  No midline or  bony TTP. FROM. Strength LEs 5/5 all muscle groups except right hip abduction 4/5.   1+ MSRs in patellar and achilles tendons, equal bilaterally. Negative SLRs. Sensation intact to light touch bilaterally.  Right hip: No deformity. TTP greater trochanter, proximal IT band.  No other tenderness. FROM with 4/5 strength hip abduction.  5/5 other motions. Negative logroll Negative fabers and piriformis stretches.   Assessment & Plan:  1. Right hip pain - 2/2 IT band syndrome with trochanteric bursitis.  Icing, tylenol if needed.  Bursa injection given today.  F/u in 6 weeks.  Consider physical therapy.  Shown home exercises and stretches to do daily.  After informed written consent timeout was performed, patient was lying on left side on exam table.  Area overlying right trochanteric bursa prepped with alcohol swab then injected with 6:1 marcaine: depomedrol.  Patient tolerated procedure well without immediate complications.

## 2017-09-17 ENCOUNTER — Inpatient Hospital Stay: Payer: Medicaid Other | Admitting: Family Medicine

## 2017-09-20 ENCOUNTER — Ambulatory Visit: Payer: Medicaid Other | Admitting: Student

## 2017-10-04 ENCOUNTER — Telehealth: Payer: Self-pay | Admitting: Family Medicine

## 2017-10-04 NOTE — Telephone Encounter (Signed)
After Hours/Emergency Line Call  Call from patient regarding pain under left breast.  She states that this pain is worse with deep inspiration and is not worse with exertion.  Patient states this is consistent with her chronic chest pain.  She was recently admitted for shortness of breath without a cardiac source which was suspected to be related to pulmonary etiology.  Patient did not follow-up at clinic.  She does not take oxygen at home.  She denies any feelings of syncope, increased shortness of breath from baseline, diaphoresis, radiation of chest pain, nausea or vomiting, abdominal pain.  She lives with a roommate.  I discussed with patient regarding limited ability to evaluate patient over the phone.  Advised patient to call 911 if this pain is in any way different from her chronic.  Reviewed red flags.  Scheduled appointment at 2:10 PM on 10/05/2017 to reassess chronic chest pain.  Patient without further questions or concerns.  Will forward to PCP.  Harriet [place], [place], PGY-2

## 2017-10-05 ENCOUNTER — Other Ambulatory Visit: Payer: Self-pay

## 2017-10-05 ENCOUNTER — Encounter: Payer: Self-pay | Admitting: Internal Medicine

## 2017-10-05 ENCOUNTER — Ambulatory Visit (INDEPENDENT_AMBULATORY_CARE_PROVIDER_SITE_OTHER): Payer: Medicaid Other | Admitting: Internal Medicine

## 2017-10-05 VITALS — BP 130/82 | HR 73 | Temp 97.8°F | Wt 237.6 lb

## 2017-10-05 DIAGNOSIS — R531 Weakness: Secondary | ICD-10-CM

## 2017-10-05 DIAGNOSIS — R1012 Left upper quadrant pain: Secondary | ICD-10-CM

## 2017-10-05 DIAGNOSIS — J984 Other disorders of lung: Secondary | ICD-10-CM | POA: Diagnosis not present

## 2017-10-05 DIAGNOSIS — R739 Hyperglycemia, unspecified: Secondary | ICD-10-CM | POA: Diagnosis not present

## 2017-10-05 DIAGNOSIS — D696 Thrombocytopenia, unspecified: Secondary | ICD-10-CM | POA: Diagnosis not present

## 2017-10-05 DIAGNOSIS — R079 Chest pain, unspecified: Secondary | ICD-10-CM | POA: Diagnosis not present

## 2017-10-05 LAB — GLUCOSE, POCT (MANUAL RESULT ENTRY): POC Glucose: 98 mg/dl (ref 70–99)

## 2017-10-05 MED ORDER — LEVOTHYROXINE SODIUM 125 MCG PO TABS: 250.0000 ug | ORAL_TABLET | Freq: Every day | ORAL | 0 refills | Status: DC

## 2017-10-05 MED FILL — LEVOTHYROXINE 125 MCG TAB: 125 | 30 days supply | Qty: 60 | Fill #0

## 2017-10-05 NOTE — Progress Notes (Signed)
[facility] Progress Note  Subjective:  Holly Mueller is a 52 y.o. female with history of restrictive lung disease, lower esophageal ring s/p dilation, GERD, obesity, cirrhosis with chronic leukocopenia and thrombocytopenia, and chronic chest pain who presents for chest pain and fatigue. She was hospitalized for chest pain and shortness of breath earlier this month and had negative myoview, normal barium swallow study. She was found to have rhino/enterovirus at the time and thought to have pleurisy.  Patient has been feeling increased fatigue since Saturday with intermittent episodes of sharp chest pain, lasting as long as 15 minutes. Pain worse with deep inhalation and sometimes when laying down. Nothing makes it better. She has had nausea x 1. She overall feels weak and drained of energy. She felt light-headed and was worried she was going to pass out during work on Saturday but did not. She reports some decreased sensation of her left lower arm since this morning; says she has had this before and goes away in a couple days. Does have sick contact of baby with an "upper respiratory infection" seen on Friday. She reports taking PPI for GERD but had been started on aspirin since hospitalization, though this had previously caused upset stomach. She has not run out of metformin or levothyroxine but needs refill of the latter soon.   Also had concern of elevated CBG 382 when waiting in lobby prior to appointment. She had just eaten a banana.   ROS: No vomiting, no dark or bloody stools, no fevers or chills; reports decreased appetite  Allergies  Allergen Reactions  . Aspirin Nausea Only    Upset stomach    Social History   Tobacco Use  . Smoking status: Former Smoker    Packs/day: 0.25    Types: Cigarettes    Last attempt to quit: 1997    Years since quitting: 22.4  . Smokeless tobacco: Never Used  Substance Use Topics  . Alcohol use: Yes    Alcohol/week: 0.0 oz   Comment: occ    Objective: Blood pressure 130/82, pulse 73, temperature 97.8 F (36.6 C), temperature source Oral, weight 237 lb 9.6 oz (107.8 kg), SpO2 96 %. Body mass index is 37.21 kg/m. Constitutional: Obese female in NAD HENT: Edentulous, MMM slightly tachy  Cardiovascular: RRR, S1, S2, systolic ejection murmur (previously noted) Pulmonary/Chest: Effort normal. Bibasilar crackles (noted previously).  Abdominal: Mild TTP over epigastric region and LUQ. No RUQ pain or suprapubic pain. Obese abdomen.  Musculoskeletal: No TTP over rib cage Neurological: AOx3, no focal deficits aside from slight decreased sensation of left lower arm reported compared to right but no weakness and normal sensation of left upper arm. CNII-XII intact. Skin: Skin is warm and dry. No rash noted. No bruising noted.  Psychiatric: Slow speech (says no change). Answers questions appropriately.  Vitals reviewed  Repeat CBG on 5/28: 98 TSH on 09/11/17: 2.282  A1c on 09/11/17: 5.5 A1c on 06/15/17: 9.1  Assessment/Plan: Chest pain - Chronic but more frequent recently. Cannot elicit on exam and not having presently but does have some epigastric pain. Has had recent low risk myoview and description of chest pain does not sound consistent with cardiac origin (lasting 15 minutes at a time, can occur at rest). Will check electrolytes to rule-out electrolyte abnormality and lipase. Has had recent normal TSH.  - Recommended holding aspirin as this may be worsening chronic GERD - Role of deconditioning and obesity has been mentioned during previous work-up; will place referral for supervised  exercise program through pulmonary rehab in case she qualifies. - Has pulmonology appointment tomorrow, cardiology appointment in 2 weeks and reports having GI follow-up already.   Weakness - VSS. With decreased appetite and fatigue may be secondary to viral infection with recent sick contact vs chronic deconditioning vs electrolyte  abnormality. Will also check CBC for possible worsening leukopenia.   Hyperglycemia - Resolved.  Follow-up if no improvement. Recommended evaluation in the ED if has persistent or worsening chest pain.  Olene Floss, MD [place], PGY-3

## 2017-10-05 NOTE — Patient Instructions (Signed)
Ms. Demo,  I will call with lab results.  Please drink plenty of fluids and rest.  Stop the aspirin for the time being.  If your abdominal/chest pain worsens or does not go away, please go to the emergency room.  I will refer you for pulmonary rehab (a supervised exercise program).  Best, Dr. Ola Spurr

## 2017-10-06 ENCOUNTER — Other Ambulatory Visit: Payer: Medicaid Other

## 2017-10-06 ENCOUNTER — Encounter: Payer: Self-pay | Admitting: Internal Medicine

## 2017-10-06 ENCOUNTER — Ambulatory Visit (INDEPENDENT_AMBULATORY_CARE_PROVIDER_SITE_OTHER): Payer: Medicaid Other | Admitting: Pulmonary Disease

## 2017-10-06 ENCOUNTER — Encounter: Payer: Self-pay | Admitting: Pulmonary Disease

## 2017-10-06 VITALS — BP 100/70 | HR 86 | Ht 66.0 in | Wt 237.0 lb

## 2017-10-06 DIAGNOSIS — R0602 Shortness of breath: Secondary | ICD-10-CM

## 2017-10-06 DIAGNOSIS — R531 Weakness: Secondary | ICD-10-CM | POA: Insufficient documentation

## 2017-10-06 DIAGNOSIS — R739 Hyperglycemia, unspecified: Secondary | ICD-10-CM | POA: Insufficient documentation

## 2017-10-06 LAB — CBC
HEMATOCRIT: 35.1 % (ref 34.0–46.6)
Hemoglobin: 11.8 g/dL (ref 11.1–15.9)
MCH: 28.6 pg (ref 26.6–33.0)
MCHC: 33.6 g/dL (ref 31.5–35.7)
MCV: 85 fL (ref 79–97)
Platelets: 71 10*3/uL — CL (ref 150–450)
RBC: 4.12 x10E6/uL (ref 3.77–5.28)
RDW: 15.3 % (ref 12.3–15.4)
WBC: 1.5 10*3/uL — AB (ref 3.4–10.8)

## 2017-10-06 LAB — COMPREHENSIVE METABOLIC PANEL
A/G RATIO: 0.8 — AB (ref 1.2–2.2)
ALBUMIN: 3.8 g/dL (ref 3.5–5.5)
ALK PHOS: 117 IU/L (ref 39–117)
ALT: 46 IU/L — ABNORMAL HIGH (ref 0–32)
AST: 67 IU/L — ABNORMAL HIGH (ref 0–40)
BILIRUBIN TOTAL: 2.4 mg/dL — AB (ref 0.0–1.2)
BUN / CREAT RATIO: 17 (ref 9–23)
BUN: 13 mg/dL (ref 6–24)
CHLORIDE: 106 mmol/L (ref 96–106)
CO2: 21 mmol/L (ref 20–29)
Calcium: 9.1 mg/dL (ref 8.7–10.2)
Creatinine, Ser: 0.75 mg/dL (ref 0.57–1.00)
GFR calc Af Amer: 106 mL/min/{1.73_m2} (ref >59)
GFR calc non Af Amer: 92 mL/min/{1.73_m2} (ref >59)
GLOBULIN, TOTAL: 4.5 g/dL (ref 1.5–4.5)
Glucose: 90 mg/dL (ref 65–99)
Potassium: 4.1 mmol/L (ref 3.5–5.2)
SODIUM: 140 mmol/L (ref 134–144)
Total Protein: 8.3 g/dL (ref 6.0–8.5)

## 2017-10-06 LAB — LIPASE: LIPASE: 72 U/L (ref 14–72)

## 2017-10-06 NOTE — Assessment & Plan Note (Signed)
-  Chronic but more frequent recently. Cannot elicit on exam and not having presently but does have some epigastric pain. Has had recent low risk myoview and description of chest pain does not sound consistent with cardiac origin (lasting 15 minutes at a time, can occur at rest). Will check electrolytes to rule-out electrolyte abnormality and lipase. Has had recent normal TSH.  - Recommended holding aspirin as this may be worsening chronic GERD - Role of deconditioning and obesity has been mentioned during previous work-up; will place referral for supervised exercise program through pulmonary rehab in case she qualifies. - Has pulmonology appointment tomorrow, cardiology appointment in 2 weeks and reports having GI follow-up already.

## 2017-10-06 NOTE — Assessment & Plan Note (Signed)
-  Resolved

## 2017-10-06 NOTE — Progress Notes (Signed)
[name]    [id number]    [birth year]  Primary Care Physician:Timberlake, Curt Bears, MD  Referring Physician: [name], MD [address] [place], [place] [address]  Chief complaint:  Follow up for respiratory failure, lung infiltrate with restrictive lung disease  HPI: Holly Mueller is a 52 year old with past history of asthma. She has poor control over the past year and needed to go on several tapers of prednisone. She was seen in the ED in July 2017 for an exacerbation. She is currently maintained on Symbicort and albuterol. She needs to use her albuterol every day. She denies any nighttime awakenings. She also has significant issues with sinusitis, postnasal drip with constant clearing of the throat. She has heartburn and is on Prilosec.  Admitted from 2/4-06/19/17 with fevers, diagnosed with flu.  Treated with Tamiflu.  She was seen by GI at that time for evaluation of cirrhosis and recommended outpatient follow-up with Dr. Carlean Purl. Hospitalized again from 2/19-2/24/19 with cellulitis, lower extremity pain.  Workup was negative for PE and DVT.  She was treated with Keflex.  CT showed stable mild groundglass opacities consistent with volume overload.    She is in being followed by Dr. Lebron Conners, hematology for cytopenia status post bone marrow biopsy.  Her low blood counts are felt to be secondary to cirrhosis.  Interim History: Hospitalized again and early May for chest pain, dyspnea.  She had a stress test which is normal. Also seen by pulmonary for CTA that was negative for PE but showed some chronic lung opacities. Evaluated for aspiration.  Swallow eval was normal Serologies shows positive ANA, SSA  Outpatient Encounter Medications as of 10/06/2017  Medication Sig  . ACCU-CHEK AVIVA PLUS test strip USE UP TO 4 TIMES DAILY AS DIRECTED  . ACCU-CHEK SOFTCLIX LANCETS lancets USE UP TO 4 TIMES DAILY AS DIRECTED  . albuterol (PROAIR HFA) 108 (90 Base) MCG/ACT inhaler  INHALE 2 PUFFS INTO THE LUNGS EVERY 6 HOURS AS NEEDED FOR SHORTNESS OF BREATH  . albuterol (PROVENTIL) (2.5 MG/3ML) 0.083% nebulizer solution Take 3 mLs (2.5 mg total) by nebulization every 6 (six) hours as needed for wheezing or shortness of breath.  . blood glucose meter kit and supplies KIT Dispense based on patient and insurance preference. Use up to four times daily as directed. (FOR ICD-9 250.00, 250.01).  . cetirizine (ZYRTEC) 10 MG tablet Take 1 tablet (10 mg total) by mouth daily.  . fluticasone (FLONASE) 50 MCG/ACT nasal spray Place 2 sprays into both nostrils daily.  Marland Kitchen levothyroxine (SYNTHROID, LEVOTHROID) 125 MCG tablet Take 2 tablets (250 mcg total) by mouth daily.  . metFORMIN (GLUCOPHAGE) 500 MG tablet Take 1 tablet (500 mg total) by mouth 2 (two) times daily with a meal.  . pantoprazole (PROTONIX) 40 MG tablet TAKE 1 TABLET BY MOUTH DAILY.  Marland Kitchen promethazine-dextromethorphan (PROMETHAZINE-DM) 6.25-15 MG/5ML syrup Take 5 mLs by mouth 4 (four) times daily as needed for cough.  . budesonide-formoterol (SYMBICORT) 160-4.5 MCG/ACT inhaler Inhale 2 puffs into the lungs 2 (two) times daily. (Patient not taking: Reported on 10/06/2017)   No facility-administered encounter medications on file as of 10/06/2017.     Allergies as of 10/06/2017 - Review Complete 10/06/2017  Allergen Reaction Noted  . Aspirin Nausea Only 03/29/2012    Past Medical History:  Diagnosis Date  . Arthritis    bursitis left hip flares-not an issue  . Edentulous    10-19-13 at present  . Epilepsy ([place])  in 1972   No seizures since 1972. Previously treated with phenobarbital.   . Fever 06/2017  . GERD (gastroesophageal reflux disease) 2008  . Headache(784.0)    hx of migraines   . Hypothyroidism 2008  . Lower esophageal ring 08/18/2013  . Seasonal allergies 2003  . Shortness of breath     Past Surgical History:  Procedure Laterality Date  . BALLOON DILATION N/A 10/24/2013   Procedure: BALLOON DILATION;   Surgeon: Gatha Mayer, MD;  Location: [facility];  Service: Endoscopy;  Laterality: N/A;  . CESAREAN SECTION     x2  . DENTAL SURGERY     multiple extractions 3'15  . ESOPHAGOGASTRODUODENOSCOPY N/A 10/24/2013   Procedure: ESOPHAGOGASTRODUODENOSCOPY (EGD);  Surgeon: Gatha Mayer, MD;  Location: [facility];  Service: Endoscopy;  Laterality: N/A;  . LIVER BIOPSY  06/30/2012   Procedure: LIVER BIOPSY;  Surgeon: Shann Medal, MD;  Location: [facility];  Service: General;;  . NOVASURE ABLATION    . SHOULDER ARTHROSCOPY Right 2011  . UMBILICAL HERNIA REPAIR N/A 06/30/2012   Procedure: remove umbilicus;  Surgeon: Shann Medal, MD;  Location: [facility];  Service: General;  Laterality: N/A;  . VENTRAL HERNIA REPAIR N/A 06/30/2012   Procedure: LAPAROSCOPIC VENTRAL HERNIA;  Surgeon: Shann Medal, MD;  Location: [facility];  Service: General;  Laterality: N/A;  With Mesh  . WISDOM TOOTH EXTRACTION      Family History  Problem Relation Age of Onset  . Hypertension Mother   . Diabetes Mother   . Lung cancer Father   . Stroke Father   . Diabetes Sister   . Hypertension Sister     Social History   Socioeconomic History  . Marital status: Single    Spouse name: Not on file  . Number of children: 2  . Years of education: Not on file  . Highest education level: Not on file  Occupational History    Employer: [employer]  . Financial resource strain: Not on file  . Food insecurity:    Worry: Not on file    Inability: Not on file  . Transportation needs:    Medical: Not on file    Non-medical: Not on file  Tobacco Use  . Smoking status: Former Smoker    Packs/day: 0.25    Types: Cigarettes    Last attempt to quit: 1997    Years since quitting: 22.4  . Smokeless tobacco: Never Used  Substance and Sexual Activity  . Alcohol use: Yes    Alcohol/week: 0.0 oz    Comment: occ  . Drug use: No  . Sexual activity: Not Currently  Lifestyle  . Physical activity:    Days per  week: Not on file    Minutes per session: Not on file  . Stress: Not on file  Relationships  . Social connections:    Talks on phone: Not on file    Gets together: Not on file    Attends religious service: Not on file    Active member of club or organization: Not on file    Attends meetings of clubs or organizations: Not on file    Relationship status: Not on file  . Intimate partner violence:    Fear of current or ex partner: Not on file    Emotionally abused: Not on file    Physically abused: Not on file    Forced sexual activity: Not on file  Other Topics Concern  . Not  on file  Social History Narrative   Works in Estate manager/land agent at [employer].    Lives with 12 and 14 (boys).    Also living with close friends of the family (mother and two kids, adults).          Review of systems: Review of Systems  Constitutional: Negative for fever and chills.  HENT: Negative.   Eyes: Negative for blurred vision.  Respiratory: as per HPI  Cardiovascular: Negative for chest pain and palpitations.  Gastrointestinal: Negative for vomiting, diarrhea, blood per rectum. Genitourinary: Negative for dysuria, urgency, frequency and hematuria.  Musculoskeletal: Negative for myalgias, back pain and joint pain.  Skin: Negative for itching and rash.  Neurological: Negative for dizziness, tremors, focal weakness, seizures and loss of consciousness.  Endo/Heme/Allergies: Negative for environmental allergies.  Psychiatric/Behavioral: Negative for depression, suicidal ideas and hallucinations.  All other systems reviewed and are negative.  Physical Exam: Blood pressure 100/70, pulse 86, height 5' 6" (1.676 m), weight 237 lb (107.5 kg), SpO2 96 %. Gen:      No acute distress HEENT:  EOMI, sclera anicteric Neck:     No masses; no thyromegaly Lungs:    Bibasilar crackles.  No wheeze CV:         Regular rate and rhythm; no murmurs Abd:      + bowel sounds; soft, non-tender; no palpable masses, no  distension Ext:    No edema; adequate peripheral perfusion Skin:      Warm and dry; no rash Neuro: alert and oriented x 3 Psych: normal mood and affect  Data Reviewed: Chest x-ray 04/05/15- bibasilar opacities. Chest x-ray 11/16/15-left basal scarring.  CTA 11/23/16- no pulmonary embolism, no interstitial lung disease, cirrhosis, abdominal adenopathy. CTA 06/30/2017-no PE faint groundglass opacities, hepatic cirrhosis CTA 09/11/2017-no PE, diffuse groundglass opacity, cirrhosis. All images reviewed.  FENO 01/07/17- 6 FENO 07/06/17 <5  PFTs 11/13/16 FVC 1.84 (49%), FEV1 1.52 (51%), F/F 82, TLC 44% Unable to complete DLCO Severe restriction. No obstruction  CBC 12/02/16- WBC 1.4, platelets 66, eosinophil 4%, absolute eosinophil count 100 Blood allergy profile 03/06/16-negative, IgE less than 2  Assessment:  Respiratory failure, groundglass opacities PFTs with restrictive abnormality.  Unclear etiology of her groundglass opacities With positive ANA, Ro antibody she could have underlying connective tissue disease.  Will refer to rheumatology for further evaluation She does endorse some mold exposure at home.  Check hypersensitivity panel, mold antibody panel  Asthma Upper airway cough syndrome. My suspicion for asthma is low as she does have any obstruction on PFTs and FENO is low. IgE and eos levels are normal.  Her sinusitis, postnasal drip and acid reflux are likely contributing to symptoms Continue Protonix, Flonase Currently off Symbicort.   Cirrhosis Need to be set up with GI again for follow-up  Plan/Recommendations: - Recheck ANA, rheumatoid factor, CCP, hypersensitivity panel, mold antibody panel - Rheumatology evaluation.  Marshell Garfinkel MD [facility] 10/06/2017, 4:00 PM  CC: [name], MD

## 2017-10-06 NOTE — Assessment & Plan Note (Addendum)
-  VSS. With decreased appetite and fatigue may be secondary to viral infection with recent sick contact vs chronic deconditioning vs electrolyte abnormality. Will also check CBC for possible worsening leukopenia.

## 2017-10-06 NOTE — Patient Instructions (Addendum)
We will refer you to rheumatology for evaluation of abnormal labs Please make sure you follow-up with GI for management of his cirrhosis. We will repeat some of the labs today including ANA immuno fluorescence, rheumatoid factor, CCP, hypersensitivity panel, mold antibody panel, IgE  Follow-up in 1 to 2 months

## 2017-10-08 MED FILL — URSODIOL 500 MG TABLET: 500 | 34 days supply | Qty: 68 | Fill #0

## 2017-10-08 MED FILL — PROAIR HFA 90 MCG INHALER: 108 (90 BAS | 25 days supply | Qty: 9 | Fill #4

## 2017-10-08 MED FILL — CETIRIZINE HCL 10 MG TABS: 10 | 30 days supply | Qty: 30 | Fill #3

## 2017-10-08 MED FILL — PANTOPRAZOLE SOD DR 40 MG T: 40 | 30 days supply | Qty: 30 | Fill #2

## 2017-10-13 ENCOUNTER — Encounter: Payer: Self-pay | Admitting: Licensed Clinical Social Worker

## 2017-10-13 ENCOUNTER — Ambulatory Visit (INDEPENDENT_AMBULATORY_CARE_PROVIDER_SITE_OTHER): Payer: Medicaid Other | Admitting: Family Medicine

## 2017-10-13 DIAGNOSIS — K7460 Unspecified cirrhosis of liver: Secondary | ICD-10-CM

## 2017-10-13 DIAGNOSIS — Z5941 Food insecurity: Secondary | ICD-10-CM

## 2017-10-13 DIAGNOSIS — Z594 Lack of adequate food and safe drinking water: Secondary | ICD-10-CM

## 2017-10-13 NOTE — Patient Instructions (Signed)
It was a pleasure to see you today! Thank you for choosing [facility] for your primary care. Holly Mueller was seen for questions.   Our plans for today were:  Call medicaid transportation and get reestablished with them.   See if your brother could drive you.   Call Dr. Matilde Bash office back when you decide on [facility] or [place] for rheumatology.   Call me if you have any trouble.   Best,  Dr. Lindell Noe

## 2017-10-13 NOTE — Progress Notes (Signed)
   CC: question about labs  HPI  Saw pulm, can't recall the conclusion of this. Saw GI for cirrhosis. She says she cried at this visit. Dr. Therisa Doyne was discussing liver transplantation as a possible future therapy.  Started usadiol 567m BID. Remembers that her liver trouble is not caused by alcohol per GI.    Rides the bus here. Can't get anywhere not in [place].  We reviewed together the status of the rheumatology referral from pulmonology's office.  Hasn't gotten medicaid transportation, states her medicaid is going to run out at the end of June or July.   No fam hx of autoimmune disease.  Mother died of cirrhosis, doesn't think she drank.   No new abdominal swelling or shortness of breath.  ROS: Denies CP, SOB, abdominal pain, dysuria, changes in BMs.   CC, SH/smoking status, and VS noted  Objective: BP 100/72 (BP Location: Right Arm, Patient Position: Sitting, Cuff Size: Normal)   Pulse 83   Temp 98.4 F (36.9 C) (Oral)   Ht _0  (1.676 m)   Wt 234 lb (106.1 kg)   SpO2 92%   BMI 37.77 kg/m  Gen: NAD, alert, cooperative, and pleasant. HEENT: NCAT, EOMI, PERRL CV: RRR, no murmur Resp: easy WOB, scattered crackles bilaterally  Abd: SNTND, BS present, no guarding or organomegaly  Ext: No edema, warm Neuro: Alert and oriented, Speech clear, No gross deficits Skin: Spider angiomata present over neck and chest.  Assessment and plan:  Cirrhosis of liver without ascites (HCC) reviewed labs with patient, explained that the etiology of her psoriasis is very likely primary biliary cirrhosis. Did not explain that there may also be Sjogren's syndrome,as I would like the rheumatologist to comment on this although there are some case reports of such. .  Again explained to her the importance of getting to a rheumatologist.  She elects to discuss transportation with her brother to see if he can provide a ride, additionally gave her Medicaid transportation handout.  Food  insecurity Provided w food box by CSW.    No orders of the defined types were placed in this encounter.   No orders of the defined types were placed in this encounter.   KRalene Ok MD, PGY2 10/15/2017 1:27 PM

## 2017-10-13 NOTE — Progress Notes (Signed)
Type of Service:  Clinical Social Work Warm Handoff  Holly Mueller is a 52 y.o. female referred by CMA for assistance with food resources.  Patient is pleasant and engaged in conversation. Reports :concerns with  Food insecurities.   Life & Social patient lives with 2 sons (76 & 91) ,has family that lives local, works 1 or 2 days a week, receives $500.00 per month in food benefits Recent llfe changes: work hours are decreased Issues discussed:  Integrated care services, support system,  community resources ,and food box program.    Intervention: solutions focus strategies, community resources/ , emotional support, food box provided today. Assessment/Plan: Patient is currently experiencing food insecurities due to food benefits not lasting all month.  Patient currently has no food in her home and has not been eating much trying to make food last. Patient may benefit from, and is in agreement to participated in the food box program monthly to assist with food insecurities. Food application completed and faxed to [employer].   Warm Hand Off Completed.      Casimer Lanius, LCSW Licensed Clinical Social Worker [place]   [phone] 8:10 AM

## 2017-10-14 ENCOUNTER — Encounter: Payer: Self-pay | Admitting: Cardiology

## 2017-10-14 ENCOUNTER — Ambulatory Visit: Payer: Medicaid Other | Admitting: Cardiology

## 2017-10-14 VITALS — BP 120/80 | HR 76 | Ht 66.0 in | Wt 235.0 lb

## 2017-10-14 DIAGNOSIS — E119 Type 2 diabetes mellitus without complications: Secondary | ICD-10-CM

## 2017-10-14 DIAGNOSIS — R079 Chest pain, unspecified: Secondary | ICD-10-CM

## 2017-10-14 DIAGNOSIS — K7460 Unspecified cirrhosis of liver: Secondary | ICD-10-CM | POA: Diagnosis not present

## 2017-10-14 LAB — ANTI-NUCLEAR AB-TITER (ANA TITER): ANA Titer 1: 1:80 {titer} — ABNORMAL HIGH

## 2017-10-14 LAB — ANA, IFA COMPREHENSIVE PANEL
Anti Nuclear Antibody(ANA): POSITIVE — AB
DS DNA AB: 2 [IU]/mL
ENA SM Ab Ser-aCnc: <1 AI
SCLERODERMA (SCL-70) (ENA) ANTIBODY, IGG: NEGATIVE AI
SM/RNP: NEGATIVE AI
SSA (RO) (ENA) ANTIBODY, IGG: POSITIVE AI — AB
SSB (La) (ENA) Antibody, IgG: <1 AI

## 2017-10-14 LAB — CYCLIC CITRUL PEPTIDE ANTIBODY, IGG: Cyclic Citrullin Peptide Ab: <16 UNITS

## 2017-10-14 LAB — HYPERSENSITIVITY PNUEMONITIS PROFILE
ASPERGILLUS FUMIGATUS: NEGATIVE
Faenia retivirgula: NEGATIVE
Pigeon Serum: NEGATIVE
S. VIRIDIS: NEGATIVE
T. CANDIDUS: NEGATIVE
T. VULGARIS: NEGATIVE

## 2017-10-14 LAB — IGE: IgE (Immunoglobulin E), Serum: <2 kU/L (ref <=114)

## 2017-10-14 LAB — RHEUMATOID FACTOR: Rhuematoid fact SerPl-aCnc: <14 IU/mL (ref <14)

## 2017-10-14 NOTE — Progress Notes (Signed)
10/14/2017 [name]   04-May-1966  [id number]  Primary Physician [name], MD Primary Cardiologist: Dr Sallyanne Kuster  HPI:  Pleasant 52 y/o female, she previously worked at [employer] as a Engineer, site, now she works a Nurse, learning disability at a bowling [employer]. She has DM, obesity, positive RNA (can't get an appointment with a Rheumatologist because she is Medicaid), and cirrhosis followed by Dr Therisa Doyne. She was admitted to [facility] 09/11/17 with chest pain and had a negative Myoview 09/12/17. She is in the office today for follow up. She continues to have intermittent Lt lateral atypical chest pain as well as occasional SSCP- "pressure". Her symptoms are not clearly exertional.    Current Outpatient Medications  Medication Sig Dispense Refill  . ACCU-CHEK AVIVA PLUS test strip USE UP TO 4 TIMES DAILY AS DIRECTED 100 each 0  . ACCU-CHEK SOFTCLIX LANCETS lancets USE UP TO 4 TIMES DAILY AS DIRECTED 100 each 0  . albuterol (PROAIR HFA) 108 (90 Base) MCG/ACT inhaler INHALE 2 PUFFS INTO THE LUNGS EVERY 6 HOURS AS NEEDED FOR SHORTNESS OF BREATH 8.5 g 5  . albuterol (PROVENTIL) (2.5 MG/3ML) 0.083% nebulizer solution Take 3 mLs (2.5 mg total) by nebulization every 6 (six) hours as needed for wheezing or shortness of breath. 75 mL 2  . blood glucose meter kit and supplies KIT Dispense based on patient and insurance preference. Use up to four times daily as directed. (FOR ICD-9 250.00, 250.01). 1 each 0  . budesonide-formoterol (SYMBICORT) 160-4.5 MCG/ACT inhaler Inhale 2 puffs into the lungs 2 (two) times daily. 1 Inhaler 12  . cetirizine (ZYRTEC) 10 MG tablet Take 1 tablet (10 mg total) by mouth daily. 30 tablet 11  . fluticasone (FLONASE) 50 MCG/ACT nasal spray Place 2 sprays into both nostrils daily.    Marland Kitchen levothyroxine (SYNTHROID, LEVOTHROID) 125 MCG tablet Take 2 tablets (250 mcg total) by mouth daily. 60 tablet 0  . metFORMIN (GLUCOPHAGE) 500 MG tablet Take 1 tablet (500 mg total) by mouth 2 (two) times  daily with a meal. 180 tablet 0  . pantoprazole (PROTONIX) 40 MG tablet TAKE 1 TABLET BY MOUTH DAILY. 30 tablet 3  . promethazine-dextromethorphan (PROMETHAZINE-DM) 6.25-15 MG/5ML syrup Take 5 mLs by mouth 4 (four) times daily as needed for cough. 120 mL 0  . ursodiol (ACTIGALL) 500 MG tablet Take 500 mg by mouth 2 (two) times daily.  3   No current facility-administered medications for this visit.     Allergies  Allergen Reactions  . Aspirin Nausea Only    Upset stomach    Past Medical History:  Diagnosis Date  . Arthritis    bursitis left hip flares-not an issue  . Edentulous    10-19-13 at present  . Epilepsy ([place] in 1972   No seizures since 1972. Previously treated with phenobarbital.   . Fever 06/2017  . GERD (gastroesophageal reflux disease) 2008  . Headache(784.0)    hx of migraines   . Hypothyroidism 2008  . Lower esophageal ring 08/18/2013  . Seasonal allergies 2003  . Shortness of breath     Social History   Socioeconomic History  . Marital status: Single    Spouse name: Not on file  . Number of children: 2  . Years of education: Not on file  . Highest education level: Not on file  Occupational History    Employer: [employer]  . Financial resource strain: Not on file  . Food insecurity:    Worry:  Not on file    Inability: Not on file  . Transportation needs:    Medical: Not on file    Non-medical: Not on file  Tobacco Use  . Smoking status: Former Smoker    Packs/day: 0.25    Types: Cigarettes    Last attempt to quit: 1997    Years since quitting: 22.4  . Smokeless tobacco: Never Used  Substance and Sexual Activity  . Alcohol use: Yes    Alcohol/week: 0.0 oz    Comment: occ  . Drug use: No  . Sexual activity: Not Currently  Lifestyle  . Physical activity:    Days per week: Not on file    Minutes per session: Not on file  . Stress: Not on file  Relationships  . Social connections:    Talks on phone: Not on file    Gets  together: Not on file    Attends religious service: Not on file    Active member of club or organization: Not on file    Attends meetings of clubs or organizations: Not on file    Relationship status: Not on file  . Intimate partner violence:    Fear of current or ex partner: Not on file    Emotionally abused: Not on file    Physically abused: Not on file    Forced sexual activity: Not on file  Other Topics Concern  . Not on file  Social History Narrative   Works in Estate manager/land agent at [employer].    Lives with 12 and 14 (boys).    Also living with close friends of the family (mother and two kids, adults).            Family History  Problem Relation Age of Onset  . Hypertension Mother   . Diabetes Mother   . Lung cancer Father   . Stroke Father   . Diabetes Sister   . Hypertension Sister      Review of Systems: General: negative for chills, fever, night sweats or weight changes.  Cardiovascular: negative for dyspnea on exertion, edema, orthopnea, palpitations, paroxysmal nocturnal dyspnea or shortness of breath Dermatological: negative for rash Respiratory: negative for cough or wheezing Urologic: negative for hematuria Abdominal: negative for nausea, vomiting, diarrhea, bright red blood per rectum, melena, or hematemesis Neurologic: negative for visual changes, syncope, or dizziness All other systems reviewed and are otherwise negative except as noted above.    Blood pressure 120/80, pulse 76, height 5' 6" (1.676 m), weight 235 lb (106.6 kg).  General appearance: alert, cooperative, no distress and morbidly obese Neck: no carotid bruit and no JVD Lungs: clear to auscultation bilaterally Heart: regular rate and rhythm Extremities: extremities normal, atraumatic, no cyanosis or edema Skin: Skin color, texture, turgor normal. No rashes or lesions Neurologic: Grossly normal  EKG NSR  ASSESSMENT AND PLAN:   1. Chest pain - troponin x 3 negative - EKG without  acute signs of ischemia -  myoview low risk 09/12/17  2. DM A1c 5.5%, well-controlled  3. Obesity Encouraged proper diet and walking program. She gets DOE, not new for her. Echo with normal EF. She appears euvolemic in exam.   4. RNA + She has been unable to see a rheumatologist secondary to her inability to travel outside [place] and the fact that she is Medicaid.   PLAN  F/U in 3 [facility]monhs. If she continues to have chest pain we may need to investigate further- coronary CTA or diagnostic cath.  Kerin Ransom PA-C 10/14/2017 4:30 PM

## 2017-10-14 NOTE — Patient Instructions (Addendum)
Medication Instructions:  Your physician recommends that you continue on your current medications as directed. Please refer to the Current Medication list given to you today.  Labwork: None   Testing/Procedures: none  Follow-Up: Your physician recommends that you schedule a follow-up appointment in: 3 months with Dr Sallyanne Kuster.  Any Other Special Instructions Will Be Listed Below (If Applicable). If you need a refill on your cardiac medications before your next appointment, please call your pharmacy.

## 2017-10-15 ENCOUNTER — Telehealth: Payer: Self-pay | Admitting: Hematology and Oncology

## 2017-10-15 ENCOUNTER — Other Ambulatory Visit (INDEPENDENT_AMBULATORY_CARE_PROVIDER_SITE_OTHER): Payer: Medicaid Other

## 2017-10-15 DIAGNOSIS — Z594 Lack of adequate food and safe drinking water: Secondary | ICD-10-CM | POA: Insufficient documentation

## 2017-10-15 DIAGNOSIS — R079 Chest pain, unspecified: Secondary | ICD-10-CM

## 2017-10-15 DIAGNOSIS — Z5941 Food insecurity: Secondary | ICD-10-CM | POA: Insufficient documentation

## 2017-10-15 NOTE — Telephone Encounter (Signed)
Called pt re appts being moved to [facility] - left vm for pt and sending confirmation letter in the mail.

## 2017-10-15 NOTE — Assessment & Plan Note (Signed)
reviewed labs with patient, explained that the etiology of her psoriasis is very likely primary biliary cirrhosis. Did not explain that there may also be Sjogren's syndrome,as I would like the rheumatologist to comment on this although there are some case reports of such. .  Again explained to her the importance of getting to a rheumatologist.  She elects to discuss transportation with her brother to see if he can provide a ride, additionally gave her Medicaid transportation handout.

## 2017-10-15 NOTE — Progress Notes (Signed)
ekg 

## 2017-10-15 NOTE — Assessment & Plan Note (Signed)
Provided w food box by CSW.

## 2017-10-22 ENCOUNTER — Telehealth: Payer: Self-pay | Admitting: Pulmonary Disease

## 2017-10-22 DIAGNOSIS — R768 Other specified abnormal immunological findings in serum: Secondary | ICD-10-CM

## 2017-10-22 NOTE — Telephone Encounter (Signed)
Attempted to call pt. I did not receive an answer. I have left a message for pt to return our call.  

## 2017-10-25 NOTE — Telephone Encounter (Signed)
Spoke with pt. Referral has been placed. Nothing further was needed.

## 2017-10-28 ENCOUNTER — Telehealth: Payer: Self-pay | Admitting: Pulmonary Disease

## 2017-10-28 NOTE — Telephone Encounter (Signed)
Called and spoke with pt stating to her an order was placed for her to be referred to rheumatology 6/17.  Pt expressed understanding. Nothing further needed.

## 2017-11-02 ENCOUNTER — Telehealth: Payer: Self-pay | Admitting: *Deleted

## 2017-11-02 DIAGNOSIS — K7460 Unspecified cirrhosis of liver: Secondary | ICD-10-CM

## 2017-11-02 NOTE — Telephone Encounter (Signed)
Happy to replace rheumatology referral.

## 2017-11-02 NOTE — Telephone Encounter (Signed)
Pt's pulmonologist wants to refer her to [facility] rheumatology in [place].  Unfortunately the PCP must make the referral.  Will send to PCP. Please see other referrals for info. Leita Lindbloom, [name], CMA

## 2017-11-05 ENCOUNTER — Ambulatory Visit: Payer: Medicaid Other | Admitting: Podiatry

## 2017-11-05 ENCOUNTER — Ambulatory Visit (INDEPENDENT_AMBULATORY_CARE_PROVIDER_SITE_OTHER): Payer: Medicaid Other

## 2017-11-05 ENCOUNTER — Other Ambulatory Visit: Payer: Self-pay | Admitting: Podiatry

## 2017-11-05 DIAGNOSIS — M722 Plantar fascial fibromatosis: Secondary | ICD-10-CM

## 2017-11-05 DIAGNOSIS — M79671 Pain in right foot: Secondary | ICD-10-CM

## 2017-11-05 NOTE — Patient Instructions (Signed)

## 2017-11-05 NOTE — Progress Notes (Signed)
Subjective:  Patient ID: Holly Mueller, female    DOB: [birth year],  MRN: [id number]  Chief Complaint  Patient presents with  . Foot Pain    right heel    52 y.o. female presents with the above complaint. Reports chronic R heel pain x 2 months. Stands on her feet at work and has pain 30 minutes into her shift.  Past Medical History:  Diagnosis Date  . Arthritis    bursitis left hip flares-not an issue  . Edentulous    10-19-13 at present  . Epilepsy ([place]) in 1972   No seizures since 1972. Previously treated with phenobarbital.   . Fever 06/2017  . GERD (gastroesophageal reflux disease) 2008  . Headache(784.0)    hx of migraines   . Hypothyroidism 2008  . Lower esophageal ring 08/18/2013  . Seasonal allergies 2003  . Shortness of breath    Past Surgical History:  Procedure Laterality Date  . BALLOON DILATION N/A 10/24/2013   Procedure: BALLOON DILATION;  Surgeon: Gatha Mayer, MD;  Location: [facility] ENDOSCOPY;  Service: Endoscopy;  Laterality: N/A;  . CESAREAN SECTION     x2  . DENTAL SURGERY     multiple extractions 3'15  . ESOPHAGOGASTRODUODENOSCOPY N/A 10/24/2013   Procedure: ESOPHAGOGASTRODUODENOSCOPY (EGD);  Surgeon: Gatha Mayer, MD;  Location: [facility];  Service: Endoscopy;  Laterality: N/A;  . LIVER BIOPSY  06/30/2012   Procedure: LIVER BIOPSY;  Surgeon: Shann Medal, MD;  Location: [facility] ORS;  Service: General;;  . NOVASURE ABLATION    . SHOULDER ARTHROSCOPY Right 2011  . UMBILICAL HERNIA REPAIR N/A 06/30/2012   Procedure: remove umbilicus;  Surgeon: Shann Medal, MD;  Location: [facility] ORS;  Service: General;  Laterality: N/A;  . VENTRAL HERNIA REPAIR N/A 06/30/2012   Procedure: LAPAROSCOPIC VENTRAL HERNIA;  Surgeon: Shann Medal, MD;  Location: [facility] ORS;  Service: General;  Laterality: N/A;  With Mesh  . WISDOM TOOTH EXTRACTION      Current Outpatient Medications:  .  ACCU-CHEK AVIVA PLUS test strip, USE UP TO 4 TIMES DAILY AS DIRECTED, Disp: 100 each, Rfl: 0 .   ACCU-CHEK SOFTCLIX LANCETS lancets, USE UP TO 4 TIMES DAILY AS DIRECTED, Disp: 100 each, Rfl: 0 .  albuterol (PROAIR HFA) 108 (90 Base) MCG/ACT inhaler, INHALE 2 PUFFS INTO THE LUNGS EVERY 6 HOURS AS NEEDED FOR SHORTNESS OF BREATH, Disp: 8.5 g, Rfl: 5 .  albuterol (PROVENTIL) (2.5 MG/3ML) 0.083% nebulizer solution, Take 3 mLs (2.5 mg total) by nebulization every 6 (six) hours as needed for wheezing or shortness of breath., Disp: 75 mL, Rfl: 2 .  blood glucose meter kit and supplies KIT, Dispense based on patient and insurance preference. Use up to four times daily as directed. (FOR ICD-9 250.00, 250.01)., Disp: 1 each, Rfl: 0 .  budesonide-formoterol (SYMBICORT) 160-4.5 MCG/ACT inhaler, Inhale 2 puffs into the lungs 2 (two) times daily., Disp: 1 Inhaler, Rfl: 12 .  cetirizine (ZYRTEC) 10 MG tablet, Take 1 tablet (10 mg total) by mouth daily., Disp: 30 tablet, Rfl: 11 .  fluticasone (FLONASE) 50 MCG/ACT nasal spray, Place 2 sprays into both nostrils daily., Disp: , Rfl:  .  levothyroxine (SYNTHROID, LEVOTHROID) 125 MCG tablet, Take 2 tablets (250 mcg total) by mouth daily., Disp: 60 tablet, Rfl: 0 .  metFORMIN (GLUCOPHAGE) 500 MG tablet, Take 1 tablet (500 mg total) by mouth 2 (two) times daily with a meal., Disp: 180 tablet, Rfl: 0 .  pantoprazole (PROTONIX) 40 MG tablet, TAKE  1 TABLET BY MOUTH DAILY., Disp: 30 tablet, Rfl: 3 .  promethazine-dextromethorphan (PROMETHAZINE-DM) 6.25-15 MG/5ML syrup, Take 5 mLs by mouth 4 (four) times daily as needed for cough., Disp: 120 mL, Rfl: 0 .  ursodiol (ACTIGALL) 500 MG tablet, Take 500 mg by mouth 2 (two) times daily., Disp: , Rfl: 3  Allergies  Allergen Reactions  . Aspirin Nausea Only    Upset stomach   Review of Systems: Negative except as noted in the HPI. Denies N/V/F/Ch. Objective:  There were no vitals filed for this visit. General AA&O x3. Normal mood and affect.  Vascular Dorsalis pedis and posterior tibial pulses  present 2+ bilaterally    Capillary refill normal to all digits. Pedal hair growth normal.  Neurologic Epicritic sensation grossly present.  Dermatologic No open lesions. Interspaces clear of maceration. Nails well groomed and normal in appearance.  Orthopedic: POP R heel medial calc tuber Decreased Ankle joint ROM L   Assessment & Plan:  Patient was evaluated and treated and all questions answered.  Plantar Fasciitis, right - XR reviewed as above.  - Educated on icing and stretching. Instructions given.  - Injection delivered to the plantar fascia as below. - Avoid NSAIDs due to liver cirrhosis  Procedure: Injection Tendon/Ligament Location: Right plantar fascia at the glabrous junction; medial approach. Skin Prep: Alcohol. Injectate: 1 cc 0.5% marcaine plain, 0.5cc celestone. Disposition: Patient tolerated procedure well. Injection site dressed with a band-aid.     -  No follow-ups on file.

## 2017-11-18 ENCOUNTER — Inpatient Hospital Stay: Payer: Medicaid Other | Attending: Hematology and Oncology | Admitting: Oncology

## 2017-11-18 ENCOUNTER — Inpatient Hospital Stay: Payer: Medicaid Other

## 2017-11-22 ENCOUNTER — Other Ambulatory Visit: Payer: Self-pay | Admitting: Family Medicine

## 2017-11-22 ENCOUNTER — Telehealth: Payer: Self-pay | Admitting: *Deleted

## 2017-11-22 ENCOUNTER — Other Ambulatory Visit: Payer: Self-pay | Admitting: Internal Medicine

## 2017-11-22 MED FILL — ACCU-CHEK AVIVA PLUS TEST S: 25 days supply | Qty: 100 | Fill #0

## 2017-11-22 MED FILL — ACCU-CHEK SOFTCLIX LANCETS: 25 days supply | Qty: 100 | Fill #0

## 2017-11-22 MED FILL — CETIRIZINE HCL 10 MG TABS: 10 | 30 days supply | Qty: 30 | Fill #4

## 2017-11-22 MED FILL — LEVOTHYROXINE 125 MCG TAB: 125 | 30 days supply | Qty: 60 | Fill #0

## 2017-11-22 MED FILL — URSODIOL 500 MG TABLET: 500 | 34 days supply | Qty: 68 | Fill #1

## 2017-11-22 MED FILL — PANTOPRAZOLE SOD DR 40 MG T: 40 | 30 days supply | Qty: 30 | Fill #3

## 2017-11-22 NOTE — Telephone Encounter (Signed)
Pt lm on nurse line.  She wanted to "talk to someone about my medications".    Attempted to call back.  No answer, lmovm for pt to return call. Kit Brubacher, [name], CMA

## 2017-11-24 ENCOUNTER — Other Ambulatory Visit: Payer: Self-pay

## 2017-11-24 DIAGNOSIS — E119 Type 2 diabetes mellitus without complications: Secondary | ICD-10-CM

## 2017-11-25 MED ORDER — METFORMIN HCL 500 MG PO TABS: 500.0000 mg | ORAL_TABLET | Freq: Two times a day (BID) | ORAL | 0 refills | Status: DC

## 2017-11-25 MED FILL — metFORMIN HCL 500 MG TABS: 500 | 30 days supply | Qty: 60 | Fill #0

## 2017-12-02 ENCOUNTER — Ambulatory Visit: Payer: Medicaid Other | Admitting: Podiatry

## 2017-12-07 ENCOUNTER — Ambulatory Visit: Payer: Medicaid Other | Admitting: Pulmonary Disease

## 2017-12-21 ENCOUNTER — Ambulatory Visit: Payer: Medicaid Other | Admitting: Pulmonary Disease

## 2017-12-28 ENCOUNTER — Ambulatory Visit (INDEPENDENT_AMBULATORY_CARE_PROVIDER_SITE_OTHER): Payer: Self-pay | Admitting: Family Medicine

## 2017-12-28 ENCOUNTER — Encounter (HOSPITAL_COMMUNITY): Payer: Self-pay | Admitting: Emergency Medicine

## 2017-12-28 ENCOUNTER — Inpatient Hospital Stay (HOSPITAL_COMMUNITY)
Admission: EM | Admit: 2017-12-28 | Discharge: 2018-01-05 | DRG: 808 | Disposition: A | Discharge status: Home / Self Care | Payer: Self-pay | Attending: Family Medicine | Admitting: Family Medicine

## 2017-12-28 ENCOUNTER — Emergency Department (HOSPITAL_COMMUNITY): Payer: Self-pay

## 2017-12-28 ENCOUNTER — Other Ambulatory Visit: Payer: Self-pay

## 2017-12-28 ENCOUNTER — Encounter: Payer: Self-pay | Admitting: Family Medicine

## 2017-12-28 VITALS — BP 132/72 | HR 73 | Temp 98.1°F | Ht 66.0 in | Wt 245.4 lb

## 2017-12-28 DIAGNOSIS — E119 Type 2 diabetes mellitus without complications: Secondary | ICD-10-CM

## 2017-12-28 DIAGNOSIS — Y95 Nosocomial condition: Secondary | ICD-10-CM | POA: Diagnosis present

## 2017-12-28 DIAGNOSIS — J189 Pneumonia, unspecified organism: Secondary | ICD-10-CM

## 2017-12-28 DIAGNOSIS — D709 Neutropenia, unspecified: Principal | ICD-10-CM | POA: Diagnosis present

## 2017-12-28 DIAGNOSIS — J9811 Atelectasis: Secondary | ICD-10-CM | POA: Diagnosis present

## 2017-12-28 DIAGNOSIS — G2581 Restless legs syndrome: Secondary | ICD-10-CM

## 2017-12-28 DIAGNOSIS — Z79890 Hormone replacement therapy (postmenopausal): Secondary | ICD-10-CM

## 2017-12-28 DIAGNOSIS — E8809 Other disorders of plasma-protein metabolism, not elsewhere classified: Secondary | ICD-10-CM | POA: Diagnosis present

## 2017-12-28 DIAGNOSIS — Z823 Family history of stroke: Secondary | ICD-10-CM

## 2017-12-28 DIAGNOSIS — E669 Obesity, unspecified: Secondary | ICD-10-CM | POA: Diagnosis present

## 2017-12-28 DIAGNOSIS — Z833 Family history of diabetes mellitus: Secondary | ICD-10-CM

## 2017-12-28 DIAGNOSIS — R509 Fever, unspecified: Secondary | ICD-10-CM | POA: Diagnosis present

## 2017-12-28 DIAGNOSIS — F489 Nonpsychotic mental disorder, unspecified: Secondary | ICD-10-CM | POA: Diagnosis present

## 2017-12-28 DIAGNOSIS — K219 Gastro-esophageal reflux disease without esophagitis: Secondary | ICD-10-CM | POA: Diagnosis present

## 2017-12-28 DIAGNOSIS — Z8249 Family history of ischemic heart disease and other diseases of the circulatory system: Secondary | ICD-10-CM

## 2017-12-28 DIAGNOSIS — R05 Cough: Secondary | ICD-10-CM

## 2017-12-28 DIAGNOSIS — Z886 Allergy status to analgesic agent status: Secondary | ICD-10-CM

## 2017-12-28 DIAGNOSIS — J45909 Unspecified asthma, uncomplicated: Secondary | ICD-10-CM | POA: Diagnosis present

## 2017-12-28 DIAGNOSIS — Z7951 Long term (current) use of inhaled steroids: Secondary | ICD-10-CM

## 2017-12-28 DIAGNOSIS — Z6839 Body mass index (BMI) 39.0-39.9, adult: Secondary | ICD-10-CM

## 2017-12-28 DIAGNOSIS — G40909 Epilepsy, unspecified, not intractable, without status epilepticus: Secondary | ICD-10-CM | POA: Diagnosis present

## 2017-12-28 DIAGNOSIS — E039 Hypothyroidism, unspecified: Secondary | ICD-10-CM | POA: Diagnosis present

## 2017-12-28 DIAGNOSIS — Z87891 Personal history of nicotine dependence: Secondary | ICD-10-CM

## 2017-12-28 DIAGNOSIS — K7460 Unspecified cirrhosis of liver: Secondary | ICD-10-CM | POA: Diagnosis present

## 2017-12-28 DIAGNOSIS — D6959 Other secondary thrombocytopenia: Secondary | ICD-10-CM | POA: Diagnosis present

## 2017-12-28 DIAGNOSIS — R059 Cough, unspecified: Secondary | ICD-10-CM

## 2017-12-28 DIAGNOSIS — R768 Other specified abnormal immunological findings in serum: Secondary | ICD-10-CM | POA: Diagnosis present

## 2017-12-28 DIAGNOSIS — Z7984 Long term (current) use of oral hypoglycemic drugs: Secondary | ICD-10-CM

## 2017-12-28 DIAGNOSIS — R5081 Fever presenting with conditions classified elsewhere: Secondary | ICD-10-CM | POA: Diagnosis present

## 2017-12-28 DIAGNOSIS — Z09 Encounter for follow-up examination after completed treatment for conditions other than malignant neoplasm: Secondary | ICD-10-CM

## 2017-12-28 DIAGNOSIS — Z801 Family history of malignant neoplasm of trachea, bronchus and lung: Secondary | ICD-10-CM

## 2017-12-28 HISTORY — DX: Type 2 diabetes mellitus without complications: E11.9

## 2017-12-28 LAB — URINALYSIS, ROUTINE W REFLEX MICROSCOPIC
Bilirubin Urine: NEGATIVE
Glucose, UA: NEGATIVE mg/dL
KETONES UR: NEGATIVE mg/dL
Leukocytes, UA: NEGATIVE
NITRITE: NEGATIVE
PROTEIN: NEGATIVE mg/dL
Specific Gravity, Urine: 1.012 (ref 1.005–1.030)
pH: 7 (ref 5.0–8.0)

## 2017-12-28 LAB — COMPREHENSIVE METABOLIC PANEL
ALBUMIN: 2.9 g/dL — AB (ref 3.5–5.0)
ALT: 25 U/L (ref 0–44)
ANION GAP: 8 (ref 5–15)
AST: 49 U/L — AB (ref 15–41)
Alkaline Phosphatase: 110 U/L (ref 38–126)
BUN: 8 mg/dL (ref 6–20)
CALCIUM: 8.6 mg/dL — AB (ref 8.9–10.3)
CO2: 20 mmol/L — AB (ref 22–32)
Chloride: 111 mmol/L (ref 98–111)
Creatinine, Ser: 0.63 mg/dL (ref 0.44–1.00)
GFR calc Af Amer: >60 mL/min (ref >60)
GFR calc non Af Amer: >60 mL/min (ref >60)
GLUCOSE: 109 mg/dL — AB (ref 70–99)
Potassium: 3.8 mmol/L (ref 3.5–5.1)
SODIUM: 139 mmol/L (ref 135–145)
Total Bilirubin: 2.3 mg/dL — ABNORMAL HIGH (ref 0.3–1.2)
Total Protein: 7.3 g/dL (ref 6.5–8.1)

## 2017-12-28 LAB — CBC WITH DIFFERENTIAL/PLATELET
BASOS ABS: 0 10*3/uL (ref 0.0–0.1)
Basophils Relative: 2 %
EOS ABS: 0.1 10*3/uL (ref 0.0–0.7)
Eosinophils Relative: 5 %
HCT: 32.6 % — ABNORMAL LOW (ref 36.0–46.0)
HEMOGLOBIN: 10.8 g/dL — AB (ref 12.0–15.0)
LYMPHS PCT: 36 %
Lymphs Abs: 0.4 10*3/uL — ABNORMAL LOW (ref 0.7–4.0)
MCH: 29.2 pg (ref 26.0–34.0)
MCHC: 33.1 g/dL (ref 30.0–36.0)
MCV: 88.1 fL (ref 78.0–100.0)
Monocytes Absolute: 0.1 10*3/uL (ref 0.1–1.0)
Monocytes Relative: 14 %
NEUTROS ABS: 0.4 10*3/uL — AB (ref 1.7–7.7)
NEUTROS PCT: 43 %
Platelets: 43 10*3/uL — ABNORMAL LOW (ref 150–400)
RBC: 3.7 MIL/uL — AB (ref 3.87–5.11)
RDW: 13.9 % (ref 11.5–15.5)
WBC: 1 10*3/uL — AB (ref 4.0–10.5)

## 2017-12-28 LAB — POCT GLYCOSYLATED HEMOGLOBIN (HGB A1C): HbA1c, POC (controlled diabetic range): 5.1 % (ref 0.0–7.0)

## 2017-12-28 LAB — LIPASE, BLOOD: Lipase: 56 U/L — ABNORMAL HIGH (ref 11–51)

## 2017-12-28 LAB — I-STAT CG4 LACTIC ACID, ED: Lactic Acid, Venous: 1.45 mmol/L (ref 0.5–1.9)

## 2017-12-28 MED ORDER — SODIUM CHLORIDE 0.9 % IV SOLN
2.0000 g | Freq: Three times a day (TID) | INTRAVENOUS | Status: DC
  Administered 2017-12-29 – 2017-12-31 (×7): 2 g via INTRAVENOUS
  Filled 2017-12-28 (×8): qty 2

## 2017-12-28 MED ORDER — METRONIDAZOLE IN NACL 5-0.79 MG/ML-% IV SOLN
500.0000 mg | Freq: Three times a day (TID) | INTRAVENOUS | Status: DC
  Administered 2017-12-28 – 2017-12-29 (×2): 500 mg via INTRAVENOUS
  Filled 2017-12-28 (×3): qty 100

## 2017-12-28 MED ORDER — VANCOMYCIN HCL IN DEXTROSE 1-5 GM/200ML-% IV SOLN
1000.0000 mg | Freq: Two times a day (BID) | INTRAVENOUS | Status: DC
  Administered 2017-12-29: 1000 mg via INTRAVENOUS
  Filled 2017-12-28 (×2): qty 200

## 2017-12-28 MED ORDER — SODIUM CHLORIDE 0.9 % IV SOLN
2.0000 g | Freq: Once | INTRAVENOUS | Status: AC
  Administered 2017-12-28: 2 g via INTRAVENOUS
  Filled 2017-12-28: qty 2

## 2017-12-28 MED ORDER — VANCOMYCIN HCL IN DEXTROSE 1-5 GM/200ML-% IV SOLN: 1000.0000 mg | Freq: Once | INTRAVENOUS | Status: DC

## 2017-12-28 MED ORDER — VANCOMYCIN HCL 10 G IV SOLR
2000.0000 mg | Freq: Once | INTRAVENOUS | Status: AC
  Administered 2017-12-28: 2000 mg via INTRAVENOUS
  Filled 2017-12-28: qty 2000

## 2017-12-28 MED ORDER — CAPSAICIN 0.025 % EX CREA: TOPICAL_CREAM | Freq: Two times a day (BID) | CUTANEOUS | 0 refills | Status: DC

## 2017-12-28 NOTE — Assessment & Plan Note (Addendum)
Seems consistent with  Restless legs syndrome of which a subset can be unilateral.  Will check for iron def.  Recent thyroid tests are good.   Diabetes is well controlled.  Given her cirrhosis will try topical capsaicin first before oral medications.  Follow up with PCP to see how progressing

## 2017-12-28 NOTE — Progress Notes (Signed)
Pharmacy Antibiotic Note  MALYNDA SMOLINSKI is a 52 y.o. female admitted on 12/28/2017 with empiric - unknown source.  Pharmacy has been consulted for vancomycin and cefepime dosing.  Has chronic leukopenia secondary to cirrhosis - reporting fever, chills, myalgias, productive cough. WBC 1, LA 1.45, afebrile in ED. Scr 0.63 (normCrCl >100 mL/min).   Plan: Vancomycin 2g IV once then 1g IV every 12 hours  Cefepime 2g IV every 8 hours Monitor renal function, cx results, clinical pic, and VT as indicated   Temp (24hrs), Avg:98.9 F (37.2 C), Min:98.1 F (36.7 C), Max:99.9 F (37.7 C)  Recent Labs  Lab 12/28/17 1949 12/28/17 1950  WBC 1.0*  --   CREATININE 0.63  --   LATICACIDVEN  --  1.45    Estimated Creatinine Clearance: 104 mL/min (by C-G formula based on SCr of 0.63 mg/dL).    Allergies  Allergen Reactions  . Aspirin Nausea Only    Upset stomach    Antimicrobials this admission: Cefepime 8/20 >>  Vancomycin 8/20 >>   Dose adjustments this admission: N/A  Microbiology results: 8/20 BCx: sent 8/20 UCx: sent   Thank you for allowing pharmacy to be a part of this patient's care.  Doylene Canard, PharmD Clinical Pharmacist  Pager: [phone] Phone: [phone] 12/28/2017 9:12 PM

## 2017-12-28 NOTE — ED Notes (Signed)
Pt. To XRAY via stretcher. Mask placed on pt.

## 2017-12-28 NOTE — ED Provider Notes (Signed)
[facility] Provider Note   CSN: [id number] Arrival date & time: 12/28/17  1914     History   Chief Complaint Chief Complaint  Patient presents with  . Fever    HPI Holly Mueller is a 52 y.o. female.  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  Productive cough began today.  She has had no urinary symptoms.  No chest or abdominal discomfort.  No headache or neck stiffness.  No photophobia.  No rashes noted.  Does have positive sick exposures.  Has self medicated with ibuprofen.  Denies any vomiting or diarrhea.  No other treatments used prior to arrival     Past Medical History:  Diagnosis Date  . Arthritis    bursitis left hip flares-not an issue  . Diabetes mellitus without complication ([place])   . Edentulous    10-19-13 at present  . Epilepsy ([place]) in 1972   No seizures since 1972. Previously treated with phenobarbital.   . Fever 06/2017  . GERD (gastroesophageal reflux disease) 2008  . Headache(784.0)    hx of migraines   . Hypothyroidism 2008  . Lower esophageal ring 08/18/2013  . Seasonal allergies 2003  . Shortness of breath     Patient Active Problem List   Diagnosis Date Noted  . Restless leg 12/28/2017  . Food insecurity 10/15/2017  . Weakness 10/06/2017  . Hyperglycemia 10/06/2017  . Chest pain 09/11/2017  . Ectatic aorta ([place]) 09/11/2017  . Chest pressure 09/11/2017  . Cellulitis of left lower extremity   . Pulmonary edema 06/30/2017  . Left leg pain   . Type 2 diabetes mellitus without complication, without long-term current use of insulin ([place])   . Bibasilar crackles   . Influenza-like illness   . Neutropenic fever ([place]) 06/14/2017  . Cirrhosis of liver without ascites ([place]) 11/17/2016  . Thrombocytopenia ([place]) 11/17/2016  . Leucopenia 11/17/2016  . Bicytopenia 10/24/2016  . Epistaxis  10/23/2016  . GERD (gastroesophageal reflux disease) 10/23/2016  . Cough 12/09/2015  . Chronic pain of left thumb 10/09/2015  . Precordial chest pain 08/03/2015  . Abscess of skin of abdomen 07/10/2014  . Bursitis of left hip 09/14/2013  . Lower esophageal ring 08/18/2013  . DOE (dyspnea on exertion) 08/17/2013  . Ganglion cyst of wrist 08/24/2012  . Right hip pain 08/23/2012  . Umbilical hernia 41/28/7867  . Healthcare maintenance 02/14/2012  . Osteoarthritis 02/12/2012  . Elevated LFTs 10/30/2011  . Allergic rhinitis 08/26/2011  . Obesity 07/08/2010  . SEIZURES, HX OF 12/12/2009  . DENTAL CARIES 12/18/2008  . DEPRESSION 07/26/2008  . MIGRAINE HEADACHE 11/15/2007  . Hypothyroidism 10/12/2006  . Restrictive lung disease 07/30/2006    Past Surgical History:  Procedure Laterality Date  . BALLOON DILATION N/A 10/24/2013   Procedure: BALLOON DILATION;  Surgeon: Gatha Mayer, MD;  Location: [facility];  Service: Endoscopy;  Laterality: N/A;  . CESAREAN SECTION     x2  . DENTAL SURGERY     multiple extractions 3'15  . ESOPHAGOGASTRODUODENOSCOPY N/A 10/24/2013   Procedure: ESOPHAGOGASTRODUODENOSCOPY (EGD);  Surgeon: Gatha Mayer, MD;  Location: [facility];  Service: Endoscopy;  Laterality: N/A;  . LIVER BIOPSY  06/30/2012   Procedure: LIVER BIOPSY;  Surgeon: Shann Medal, MD;  Location: [facility];  Service: General;;  . NOVASURE ABLATION    . SHOULDER ARTHROSCOPY Right 2011  . UMBILICAL  HERNIA REPAIR N/A 06/30/2012   Procedure: remove umbilicus;  Surgeon: Shann Medal, MD;  Location: [facility];  Service: General;  Laterality: N/A;  . VENTRAL HERNIA REPAIR N/A 06/30/2012   Procedure: LAPAROSCOPIC VENTRAL HERNIA;  Surgeon: Shann Medal, MD;  Location: [facility];  Service: General;  Laterality: N/A;  With Mesh  . WISDOM TOOTH EXTRACTION       OB History   None      Home Medications    Prior to Admission medications   Medication Sig Start Date End Date Taking? Authorizing  Provider  ACCU-CHEK AVIVA PLUS test strip USE UP TO 4 TIMES DAILY AS DIRECTED 11/22/17   [name], MD  ACCU-CHEK SOFTCLIX LANCETS lancets USE UP TO 4 TIMES DAILY AS DIRECTED 11/22/17   [name], MD  albuterol (PROAIR HFA) 108 (90 Base) MCG/ACT inhaler INHALE 2 PUFFS INTO THE LUNGS EVERY 6 HOURS AS NEEDED FOR SHORTNESS OF BREATH 10/23/16   Mercy Riding, MD  albuterol (PROVENTIL) (2.5 MG/3ML) 0.083% nebulizer solution Take 3 mLs (2.5 mg total) by nebulization every 6 (six) hours as needed for wheezing or shortness of breath. 10/23/16   Mercy Riding, MD  blood glucose meter kit and supplies KIT Dispense based on patient and insurance preference. Use up to four times daily as directed. (FOR ICD-9 250.00, 250.01). 06/19/17   Rory Percy, DO  budesonide-formoterol (SYMBICORT) 160-4.5 MCG/ACT inhaler Inhale 2 puffs into the lungs 2 (two) times daily. 04/05/17   Tanna Furry, MD  capsaicin (ZOSTRIX) 0.025 % cream Apply topically 2 (two) times daily. 12/28/17   Lind Covert, MD  cetirizine (ZYRTEC) 10 MG tablet Take 1 tablet (10 mg total) by mouth daily. 01/27/17   [name], MD  fluticasone (FLONASE) 50 MCG/ACT nasal spray Place 2 sprays into both nostrils daily.    [provider]  levothyroxine (SYNTHROID, LEVOTHROID) 125 MCG tablet TAKE 2 TABLETS BY MOUTH DAILY. 11/22/17   [name], MD  metFORMIN (GLUCOPHAGE) 500 MG tablet Take 1 tablet (500 mg total) by mouth 2 (two) times daily with a meal. 11/25/17   [name], MD  pantoprazole (PROTONIX) 40 MG tablet TAKE 1 TABLET BY MOUTH DAILY. 07/20/17   [name], MD  promethazine-dextromethorphan (PROMETHAZINE-DM) 6.25-15 MG/5ML syrup Take 5 mLs by mouth 4 (four) times daily as needed for cough. 06/19/17   Rory Percy, DO  ursodiol (ACTIGALL) 500 MG tablet Take 500 mg by mouth 2 (two) times daily. 10/08/17   [provider]    Family History Family History  Problem Relation  Age of Onset  . Hypertension Mother   . Diabetes Mother   . Lung cancer Father   . Stroke Father   . Diabetes Sister   . Hypertension Sister     Social History Social History   Tobacco Use  . Smoking status: Former Smoker    Packs/day: 0.25    Types: Cigarettes    Last attempt to quit: 1997    Years since quitting: 22.6  . Smokeless tobacco: Never Used  Substance Use Topics  . Alcohol use: Yes    Comment: occ  . Drug use: No     Allergies   Aspirin   Review of Systems Review of Systems  All other systems reviewed and are negative.    Physical Exam Updated Vital Signs BP 139/80 (BP Location: Right Arm)   Pulse 81   Temp 98.8 F (37.1 C) (Oral)   Resp 19   SpO2 98%  Physical Exam  Constitutional: She is oriented to person, place, and time. She appears well-developed and well-nourished.  Non-toxic appearance. No distress.  HENT:  Head: Normocephalic and atraumatic.  Eyes: Pupils are equal, round, and reactive to light. Conjunctivae, EOM and lids are normal.  Neck: Normal range of motion. Neck supple. No tracheal deviation present. No thyroid mass present.  Cardiovascular: Normal rate, regular rhythm and normal heart sounds. Exam reveals no gallop.  No murmur heard. Pulmonary/Chest: Effort normal and breath sounds normal. No stridor. No respiratory distress. She has no decreased breath sounds. She has no wheezes. She has no rhonchi. She has no rales.  Abdominal: Soft. Normal appearance and bowel sounds are normal. She exhibits no distension. There is no tenderness. There is no rebound and no CVA tenderness.  Musculoskeletal: Normal range of motion. She exhibits no edema or tenderness.  Neurological: She is alert and oriented to person, place, and time. She has normal strength. No cranial nerve deficit or sensory deficit. GCS eye subscore is 4. GCS verbal subscore is 5. GCS motor subscore is 6.  Skin: Skin is warm and dry. No abrasion and no rash noted.    Psychiatric: She has a normal mood and affect. Her speech is normal and behavior is normal.  Nursing note and vitals reviewed.    ED Treatments / Results  Labs (all labs ordered are listed, but only abnormal results are displayed) Labs Reviewed  CBC WITH DIFFERENTIAL/PLATELET - Abnormal; Notable for the following components:      Result Value   WBC 1.0 (*)    RBC 3.70 (*)    Hemoglobin 10.8 (*)    HCT 32.6 (*)    Platelets 43 (*)    Neutro Abs 0.4 (*)    Lymphs Abs 0.4 (*)    All other components within normal limits  COMPREHENSIVE METABOLIC PANEL - Abnormal; Notable for the following components:   CO2 20 (*)    Glucose, Bld 109 (*)    Calcium 8.6 (*)    Albumin 2.9 (*)    AST 49 (*)    Total Bilirubin 2.3 (*)    All other components within normal limits  LIPASE, BLOOD - Abnormal; Notable for the following components:   Lipase 56 (*)    All other components within normal limits  URINALYSIS, ROUTINE W REFLEX MICROSCOPIC - Abnormal; Notable for the following components:   Hgb urine dipstick SMALL (*)    Bacteria, UA RARE (*)    All other components within normal limits  URINE CULTURE  CULTURE, BLOOD (ROUTINE X 2)  CULTURE, BLOOD (ROUTINE X 2)  I-STAT CG4 LACTIC ACID, ED  I-STAT CG4 LACTIC ACID, ED    EKG None  Radiology No results found.  Procedures Procedures (including critical care time)  Medications Ordered in ED Medications  ceFEPIme (MAXIPIME) 2 g in sodium chloride 0.9 % 100 mL IVPB (has no administration in time range)  metroNIDAZOLE (FLAGYL) IVPB 500 mg (has no administration in time range)  vancomycin (VANCOCIN) IVPB 1000 mg/200 mL premix (has no administration in time range)     Initial Impression / Assessment and Plan / ED Course  I have reviewed the triage vital signs and the nursing notes.  Pertinent labs & imaging results that were available during my care of the patient were reviewed by me and considered in my medical decision making (see  chart for details).     Patient with evidence of thrombocytopenia as well as leukopenia.  Lactateempirically within normal  limits.  Patient empirically started on antibiotics Will be admitted to the family practice service Final Clinical Impressions(s) / ED Diagnoses   Final diagnoses:  None    ED Discharge Orders    None       Lacretia Leigh, MD 12/28/17 2154

## 2017-12-28 NOTE — H&P (Addendum)
[facility] Admission History and Physical Service Pager: [phone]  Patient name: Holly Mueller Medical record number: [id number] Date of birth: 26-Mar-1966 Age: 52 y.o. Gender: female  Primary Care Provider: [name], MD Consultants: None Code Status: Do not Intubate, Holly Mueller brother is contact person   Chief Complaint: fever, chills, myalgias  Assessment and Plan: Holly Mueller is a 53 y.o. female presenting with fever, chills, myalgias . PMH is significant for Leukopenia 2/2 non-alcoholic cirrhosis, X6PV, Hypothyroidism, Asthma, GERD, Thrombocytopenia.  Fever of unknown Origin/Neutropenic fever  Bacterial vs viral illness. Tmax recorded here 99.63F, 102F at home. Hemodynamically stable. Symptoms most consistent with viral URI, however given high fever and inability to launch immune response, patient admitted for antibiotics and monitoring. Lactic acid 1.45, afebrile on presentation, normotensive, non-tachycardic. WBC 1.0 on admission.  UA negative, CXR chronic interstitial opacity in the left greater than right lung base unchanged from previous.  Blood cultures obtained in ED and started on Vancomycin, cefepime, flagyl. No GI symptoms, notes productive cough with white/green sputum, no shortness of breath, no skin lesions, no urinary symptoms. -Admit to FPTS, attending Dr. Gwendlyn Mueller - vitals per floor protocol - OOB with assistance - f/u blood cultures x2 - cont broad spectrum Abx with Vancomycin, flagyl, cefepime, if patient continues to appear well can consider de-escelating - Tylenol prn fever - trend lactic acid to normal - PT eval, given history of severe weakness reported by patient  Pancytopenia WBC 1.0, previously was 1.5 when checked 2 months ago. Hgb 10.8, down from 11.8 in 2 months ago. Denies hematochezia, hematuria, hemoptysis.  Has questionable history of melena, "it was dark for two days but it's fine now."  MCV WNL. Platelets  low at 43. Has had extensive workup for leukopenia and thrombocytopenia in the past and follows with Rheumatology. Workup has been negative and there is not a known cause for pancytopenia at this time. - check HIV - CBC in AM - Droplet precautions - will not order DRE because of leukopenia, will order FOBT to be performed to stool  Hypoalbuminemia 2.9 on admission.  Likely 2/2 acute infection. Ca corrects to 9.5. - no intervention needed at this time  T2DM Home meds include metformin 500 mg BID.  A1c on admission 5.1. -cont home metformin  Prior Hx of Asthma Patient states that she was told she does not have asthma and is no longer on symbicort 2 puffs BID, tales albuterol inhaler and nebs prn at home. Has not used recently. -will hold albuterol while inpatient, as patient is not taking  Hypothyroidism On synthroid 166mg QD at home. Last TSH 2.282 on 09/11/2017. -cont home synthroid  Hx of Seizures  Last seizure in 1972. No longer on meds. - no intervention needed  Cirrhosis of the liver without ascites AST 49, ALT 25, TBili 2.3 on admission.  Denies history of ascites and paracentesis. - avoid hepatotoxic agents   FEN/GI: Regular Prophylaxis: no pharmacologic ppx  Disposition: admit to medsurg  History of Present Illness:  Holly KOZAKIEWICZis a 52y.o. female presenting with neutropenic fever. She was in her usual state of health until yesterday afternoon when she developed weakness, felt she could not walk up the hill to the bus stop. She tried ibuprofen, but developed whole body aches and fever to 102F yesterday evening. Today she went to the doctor and when she got home she felt so weak she could not walk and so EMS was called. She endorses new  onset of cough today productive of white and green mucous, not coughing up blood. She states she knows her body and she could tell something is not right.  She does have sick contacts at home with roommate and son with viral illnesses,  rhinorrhea and cough.   Review Of Systems: Per HPI with the following additions:   Review of Systems  Constitutional: Positive for chills, fever and malaise/fatigue. Negative for weight loss.  HENT: Positive for congestion. Negative for ear pain and sore throat.   Eyes: Negative for blurred vision.  Respiratory: Positive for cough and sputum production. Negative for hemoptysis and shortness of breath.   Cardiovascular: Positive for chest pain (occasional , chronic). Negative for orthopnea and leg swelling.  Gastrointestinal: Positive for melena ("it was dark for 2 days but it's fine now") and nausea. Negative for abdominal pain, blood in stool, diarrhea and vomiting.  Genitourinary: Negative for dysuria and hematuria.  Musculoskeletal: Positive for myalgias.  Skin: Negative for rash.  Neurological: Positive for weakness and headaches. Negative for focal weakness and seizures.     Patient Active Problem List   Diagnosis Date Noted  . Restless leg 12/28/2017  . Fever of unknown origin 12/28/2017  . Food insecurity 10/15/2017  . Weakness 10/06/2017  . Hyperglycemia 10/06/2017  . Chest pain 09/11/2017  . Ectatic aorta ([place]) 09/11/2017  . Chest pressure 09/11/2017  . Cellulitis of left lower extremity   . Pulmonary edema 06/30/2017  . Left leg pain   . Type 2 diabetes mellitus without complication, without long-term current use of insulin ([place])   . Bibasilar crackles   . Influenza-like illness   . Neutropenic fever ([place]) 06/14/2017  . Cirrhosis of liver without ascites ([place]) 11/17/2016  . Thrombocytopenia ([place]) 11/17/2016  . Leucopenia 11/17/2016  . Bicytopenia 10/24/2016  . Epistaxis 10/23/2016  . GERD (gastroesophageal reflux disease) 10/23/2016  . Cough 12/09/2015  . Chronic pain of left thumb 10/09/2015  . Precordial chest pain 08/03/2015  . Abscess of skin of abdomen 07/10/2014  . Bursitis of left hip 09/14/2013  . Lower esophageal ring 08/18/2013  . DOE (dyspnea on  exertion) 08/17/2013  . Ganglion cyst of wrist 08/24/2012  . Right hip pain 08/23/2012  . Umbilical hernia 41/74/0814  . Healthcare maintenance 02/14/2012  . Osteoarthritis 02/12/2012  . Elevated LFTs 10/30/2011  . Allergic rhinitis 08/26/2011  . Obesity 07/08/2010  . SEIZURES, HX OF 12/12/2009  . DENTAL CARIES 12/18/2008  . DEPRESSION 07/26/2008  . MIGRAINE HEADACHE 11/15/2007  . Hypothyroidism 10/12/2006  . Restrictive lung disease 07/30/2006    Past Medical History: Past Medical History:  Diagnosis Date  . Arthritis    bursitis left hip flares-not an issue  . Diabetes mellitus without complication ([place])   . Edentulous    10-19-13 at present  . Epilepsy ([place]) in 1972   No seizures since 1972. Previously treated with phenobarbital.   . Fever 06/2017  . GERD (gastroesophageal reflux disease) 2008  . Headache(784.0)    hx of migraines   . Hypothyroidism 2008  . Lower esophageal ring 08/18/2013  . Seasonal allergies 2003  . Shortness of breath     Past Surgical History: Past Surgical History:  Procedure Laterality Date  . BALLOON DILATION N/A 10/24/2013   Procedure: BALLOON DILATION;  Surgeon: Gatha Mayer, MD;  Location: [facility];  Service: Endoscopy;  Laterality: N/A;  . CESAREAN SECTION     x2  . DENTAL SURGERY     multiple extractions 3'15  .  ESOPHAGOGASTRODUODENOSCOPY N/A 10/24/2013   Procedure: ESOPHAGOGASTRODUODENOSCOPY (EGD);  Surgeon: Gatha Mayer, MD;  Location: [facility];  Service: Endoscopy;  Laterality: N/A;  . LIVER BIOPSY  06/30/2012   Procedure: LIVER BIOPSY;  Surgeon: Shann Medal, MD;  Location: [facility];  Service: General;;  . NOVASURE ABLATION    . SHOULDER ARTHROSCOPY Right 2011  . UMBILICAL HERNIA REPAIR N/A 06/30/2012   Procedure: remove umbilicus;  Surgeon: Shann Medal, MD;  Location: [facility];  Service: General;  Laterality: N/A;  . VENTRAL HERNIA REPAIR N/A 06/30/2012   Procedure: LAPAROSCOPIC VENTRAL HERNIA;  Surgeon: Shann Medal, MD;  Location: [facility];  Service: General;  Laterality: N/A;  With Mesh  . WISDOM TOOTH EXTRACTION      Social History: Social History   Tobacco Use  . Smoking status: Former Smoker    Packs/day: 0.25    Types: Cigarettes    Last attempt to quit: 1997    Years since quitting: 22.6  . Smokeless tobacco: Never Used  Substance Use Topics  . Alcohol use: Yes    Comment: occ  . Drug use: No   Additional social history: none Please also refer to relevant sections of EMR.  Family History: Family History  Problem Relation Age of Onset  . Hypertension Mother   . Diabetes Mother   . Lung cancer Father   . Stroke Father   . Diabetes Sister   . Hypertension Sister      Allergies and Medications: Allergies  Allergen Reactions  . Aspirin Nausea Only    Upset stomach   No current facility-administered medications on file prior to encounter.    Current Outpatient Medications on File Prior to Encounter  Medication Sig Dispense Refill  . ACCU-CHEK AVIVA PLUS test strip USE UP TO 4 TIMES DAILY AS DIRECTED 100 each 0  . ACCU-CHEK SOFTCLIX LANCETS lancets USE UP TO 4 TIMES DAILY AS DIRECTED 100 each 0  . albuterol (PROAIR HFA) 108 (90 Base) MCG/ACT inhaler INHALE 2 PUFFS INTO THE LUNGS EVERY 6 HOURS AS NEEDED FOR SHORTNESS OF BREATH 8.5 g 5  . albuterol (PROVENTIL) (2.5 MG/3ML) 0.083% nebulizer solution Take 3 mLs (2.5 mg total) by nebulization every 6 (six) hours as needed for wheezing or shortness of breath. 75 mL 2  . blood glucose meter kit and supplies KIT Dispense based on patient and insurance preference. Use up to four times daily as directed. (FOR ICD-9 250.00, 250.01). 1 each 0  . cetirizine (ZYRTEC) 10 MG tablet Take 1 tablet (10 mg total) by mouth daily. 30 tablet 11  . fluticasone (FLONASE) 50 MCG/ACT nasal spray Place 2 sprays into both nostrils daily.    Marland Kitchen ibuprofen (ADVIL,MOTRIN) 200 MG tablet Take 600 mg by mouth every 6 (six) hours as needed for moderate pain.     Marland Kitchen levothyroxine (SYNTHROID, LEVOTHROID) 125 MCG tablet TAKE 2 TABLETS BY MOUTH DAILY. (Patient taking differently: Take 125 mcg by mouth daily before breakfast. ) 60 tablet 0  . metFORMIN (GLUCOPHAGE) 500 MG tablet Take 1 tablet (500 mg total) by mouth 2 (two) times daily with a meal. 180 tablet 0  . pantoprazole (PROTONIX) 40 MG tablet TAKE 1 TABLET BY MOUTH DAILY. 30 tablet 3  . promethazine-dextromethorphan (PROMETHAZINE-DM) 6.25-15 MG/5ML syrup Take 5 mLs by mouth 4 (four) times daily as needed for cough. 120 mL 0  . ursodiol (ACTIGALL) 500 MG tablet Take 500 mg by mouth 2 (two) times daily.  3  . budesonide-formoterol (SYMBICORT)  160-4.5 MCG/ACT inhaler Inhale 2 puffs into the lungs 2 (two) times daily. (Patient not taking: Reported on 12/28/2017) 1 Inhaler 12  . capsaicin (ZOSTRIX) 0.025 % cream Apply topically 2 (two) times daily. (Patient not taking: Reported on 12/28/2017) 60 g 0    Objective: BP (!) 142/82 (BP Location: Left Arm)   Pulse 75   Temp 98.6 F (37 C) (Oral)   Resp 16   SpO2 98%  Exam: General: In NAD, comfortable, pleasant Eyes: no scleral icterus, PERRL, EOMI ENTM: moist mucus membranes, non-erythematous pharynx, post-nasal drip, no teeth Neck: no cervical lymphadenopathy Cardiovascular: RRR no m/r/g Respiratory: Bibasilar crackles, no increased work of breathing, no rhonchi or wheezing, able to speak in full sentences Gastrointestinal: soft, non-tender, non-distended, + bowel sounds MSK: 1+ pitting edema to mid-shin BLE, moves all extremities equally Derm: no rashes, lesions, skin warm and dry  Neuro: A&Ox3 Psych: mood and affect appropriate for circumstance, linear thought process  Labs and Imaging: CBC BMET  Recent Labs  Lab 12/28/17 1949  WBC 1.0*  HGB 10.8*  HCT 32.6*  PLT 43*   Recent Labs  Lab 12/28/17 1949  NA 139  K 3.8  CL 111  CO2 20*  BUN 8  CREATININE 0.63  GLUCOSE 109*  CALCIUM 8.6*     Dg Chest 2 View  Result Date:  12/28/2017 CLINICAL DATA:  Short of breath EXAM: CHEST - 2 VIEW COMPARISON:  08/04/2016, CT 09/11/2017, radiograph 04/05/2017, 11/16/2015 FINDINGS: Chronic interstitial opacity at the left greater than right lung bases. No focal consolidation or pleural effusion. Stable cardiomediastinal silhouette. Low lung volume. No pneumothorax. IMPRESSION: Low lung volumes with chronic interstitial opacity in the left greater than right lung base. No acute interval change as compared with prior studies. Electronically Signed   By: Donavan Foil M.D.   On: 12/28/2017 21:22     [place], Bernita Raisin, DO 12/29/2017, 1:00 AM PGY-1, [place] pager: [phone], text pages welcome  I have separately seen and examined the patient. I have discussed the findings and exam with Dr. Sandi Carne and agree with the above note.  My changes/additions are outlined in BLUE.   Everrett Coombe, MD PGY-3 [facility]

## 2017-12-28 NOTE — ED Provider Notes (Signed)
Patient placed in Quick Look pathway, seen and evaluated   Chief Complaint: fever  HPI:   Fever, generalized malaise onset yesterday up to 101-102.  Minimal cough. Took ibuprofen 4 hrs PTA. Roommate was sick last week.   ROS: negative for vomiting, abdominal pain, diarrhea, constipation. Positive for urinary frequency and mild productive cough.   Physical Exam:   Gen: No distress  Neuro: Awake and Alert  Skin: Warm    Focused Exam: non toxic. RRR. Lungs CTAB. No suprapubic or CVA tenderness.   Initiation of care has begun. The patient has been counseled on the process, plan, and necessity for staying for the completion/evaluation, and the remainder of the medical screening examination    Arlean Hopping 12/28/17 2017    Noemi Chapel, MD 12/29/17 1052

## 2017-12-28 NOTE — Patient Instructions (Signed)
Good to see you today!  Thanks for coming in.  For your restless leg  We will check lab test to see if we can find what is causing it  Try Capsaicin (Zostrix) cream at night before bed  If this is not helping we may try a medication but have to be cautious with your liver  Come back and see Dr Lindell Noe in 1-2 weeks

## 2017-12-28 NOTE — Progress Notes (Signed)
Subjective  Holly Mueller is a 52 y.o. female is presenting with the following  LEFT LEG RESTLESSNESS For the last month has had feeling of having to move her left leg when she gets in bed.  No weakness or pain or rash or joint giving away or any symptoms in her Right leg.  No injury or change in activity.  Trying Ibuprofen and tylenol but does not help. Sometimes shaking or rubbing it helps.  Never had before. No cramping or muscle tightness   No known bleeding    Chief Complaint noted Review of Symptoms - see HPI PMH - Smoking status noted.   Cirrhosis   Objective Vital Signs reviewed BP 132/72   Pulse 73   Temp 98.1 F (36.7 C) (Oral)   Ht _0  (1.676 m)   Wt 245 lb 6.4 oz (111.3 kg)   SpO2 95%   BMI 39.61 kg/m  Legs - FROM all joints without pain.  Good pedal pulses bilaterally.  No skin changes Able to walk on heels toes and deep knee bend without symptoms  Assessments/Plans  See after visit summary for details of patient instuctions  Restless leg Seems consistent with  Restless legs syndrome of which a subset can be unilateral.  Will check for iron def.  Recent thyroid tests are good.   Diabetes is well controlled.  Given her cirrhosis will try topical capsaicin first before oral medications.  Follow up with PCP to see how progressing

## 2017-12-29 ENCOUNTER — Encounter: Payer: Self-pay | Admitting: Family Medicine

## 2017-12-29 LAB — BASIC METABOLIC PANEL
Anion gap: 4 — ABNORMAL LOW (ref 5–15)
BUN: 7 mg/dL (ref 6–20)
CHLORIDE: 113 mmol/L — AB (ref 98–111)
CO2: 20 mmol/L — AB (ref 22–32)
Calcium: 7.9 mg/dL — ABNORMAL LOW (ref 8.9–10.3)
Creatinine, Ser: 0.73 mg/dL (ref 0.44–1.00)
GFR calc Af Amer: >60 mL/min (ref >60)
GFR calc non Af Amer: >60 mL/min (ref >60)
GLUCOSE: 113 mg/dL — AB (ref 70–99)
POTASSIUM: 3.4 mmol/L — AB (ref 3.5–5.1)
Sodium: 137 mmol/L (ref 135–145)

## 2017-12-29 LAB — CBC
HEMATOCRIT: 29.5 % — AB (ref 36.0–46.0)
HEMOGLOBIN: 9.8 g/dL — AB (ref 12.0–15.0)
MCH: 29 pg (ref 26.0–34.0)
MCHC: 33.2 g/dL (ref 30.0–36.0)
MCV: 87.3 fL (ref 78.0–100.0)
Platelets: 50 10*3/uL — ABNORMAL LOW (ref 150–400)
RBC: 3.38 MIL/uL — AB (ref 3.87–5.11)
RDW: 13.8 % (ref 11.5–15.5)
WBC: 1.2 10*3/uL — CL (ref 4.0–10.5)

## 2017-12-29 LAB — IRON AND TIBC
IRON SATURATION: 56 % — AB (ref 15–55)
Iron: 193 ug/dL — ABNORMAL HIGH (ref 27–159)
TIBC: 347 ug/dL (ref 250–450)
UIBC: 154 ug/dL (ref 131–425)

## 2017-12-29 LAB — FERRITIN: FERRITIN: 45 ng/mL (ref 15–150)

## 2017-12-29 LAB — HIV ANTIBODY (ROUTINE TESTING W REFLEX): HIV Screen 4th Generation wRfx: NONREACTIVE

## 2017-12-29 LAB — PATHOLOGIST SMEAR REVIEW

## 2017-12-29 MED ORDER — PANTOPRAZOLE SODIUM 40 MG PO TBEC
40.0000 mg | DELAYED_RELEASE_TABLET | Freq: Every day | ORAL | Status: DC
  Administered 2017-12-29 – 2018-01-05 (×8): 40 mg via ORAL
  Filled 2017-12-29 (×8): qty 1

## 2017-12-29 MED ORDER — LEVOTHYROXINE SODIUM 125 MCG PO TABS
250.0000 ug | ORAL_TABLET | Freq: Every day | ORAL | Status: DC
  Administered 2017-12-29 – 2018-01-05 (×8): 250 ug via ORAL
  Filled 2017-12-29 (×8): qty 2

## 2017-12-29 MED ORDER — TBO-FILGRASTIM 300 MCG/0.5ML ~~LOC~~ SOSY
300.0000 ug | PREFILLED_SYRINGE | Freq: Every day | SUBCUTANEOUS | Status: DC
  Administered 2017-12-29: 300 ug via SUBCUTANEOUS
  Filled 2017-12-29 (×2): qty 0.5

## 2017-12-29 MED ORDER — METFORMIN HCL 500 MG PO TABS
500.0000 mg | ORAL_TABLET | Freq: Two times a day (BID) | ORAL | Status: DC
  Administered 2017-12-29 – 2018-01-05 (×14): 500 mg via ORAL
  Filled 2017-12-29 (×14): qty 1

## 2017-12-29 MED ORDER — ACETAMINOPHEN 325 MG PO TABS
650.0000 mg | ORAL_TABLET | Freq: Four times a day (QID) | ORAL | Status: DC | PRN
  Administered 2017-12-29 (×2): 650 mg via ORAL
  Filled 2017-12-29 (×2): qty 2

## 2017-12-29 MED ORDER — URSODIOL 300 MG PO CAPS
600.0000 mg | ORAL_CAPSULE | Freq: Two times a day (BID) | ORAL | Status: DC
  Administered 2017-12-29 – 2018-01-05 (×16): 600 mg via ORAL
  Filled 2017-12-29 (×16): qty 2

## 2017-12-29 MED ORDER — PROMETHAZINE HCL 25 MG PO TABS: 12.5000 mg | ORAL_TABLET | Freq: Four times a day (QID) | ORAL | Status: DC | PRN

## 2017-12-29 MED ORDER — ACETAMINOPHEN 650 MG RE SUPP: 650.0000 mg | Freq: Four times a day (QID) | RECTAL | Status: DC | PRN

## 2017-12-29 NOTE — Care Management Note (Signed)
Case Management Note  Patient Details  Name: Holly Mueller MRN: [id number] Date of Birth: 1966/02/04  Subjective/Objective:                    Action/Plan: Will continue to follow for discharge needs. Will await DC prescriptions to see if patient eligible for [facility]   Expected Discharge Date:                  Expected Discharge Plan:  Home/Self Care  In-House Referral:     Discharge planning Services  CM Consult, Medication Assistance, [facility]  Post Acute Care Choice:    Choice offered to:     DME Arranged:    DME Agency:     HH Arranged:    HH Agency:     Status of Service:  In process, will continue to follow  If discussed at Long Length of Stay Meetings, dates discussed:    Additional Comments:  Marilu Favre, RN 12/29/2017, 1:30 PM

## 2017-12-29 NOTE — Progress Notes (Signed)
Family Medicine Teaching Service Daily Progress Note Intern Pager: [phone]  Patient name: Holly Mueller Medical record number: [id number] Date of birth: 05-17-1965 Age: 52 y.o. Gender: female  Primary Care Provider: [name], MD Consultants: None  Code Status: Partial  Pt Overview and Major Events to Date:  8/20 admitted  Assessment and Plan: Holly Mueller is a 52 y.o. female presenting with fever, chills, myalgias . PMH is significant for Leukopenia 2/2 non-alcoholic cirrhosis, L3TD, Hypothyroidism, Asthma, GERD, Thrombocytopenia.  Neutropenic fever: Pt reports improvement in cough today without sputum production, minimal body aches. Initially had elevated temperature of 99.9, however afebrile since admission, hemodynamically stable. Physical exam notable for bibasilar crackles. WBC 1.2 this am, from 1.0. Lactic acid trended to wnl. ANC 400. Blood and urine cultures pending. Likely secondary to viral URI, given presentation of nasal congestion, cough with sputum production, and sick contacts at home. However, given neutropenia, will continue cefepime under cultures negative for 48 hours. Spoke with Dr. Jana Hakim, oncology, who recommended a trial of Granix 391mg daily for 2-3 days for neutropenia.   -Vitals per routine -Cont cefepime for neutropenic fever; dc vanomycin and flagyl  -Start Granix 3074m daily for 2 days  -F/u blood and urine cultures -PT to evaluate  Pancytopenia:  WBC 1.2. Hgb 9.8 today, 10.8 on admission, platelets 50. Denies hematochezia, hematuria, hemoptysis. Platelets low at 43. Has had extensive workup for leukopenia and thrombocytopenia in the past and follows with Rheumatology. Workup has been negative and there is not a known cause for pancytopenia at this time. - HIV pending  - CBC in AM - Droplet precautions - Recommend f/u outpatient with GI for routine colonoscopy   Hypoalbuminemia 2.9 on admission.  Likely 2/2 acute infection. Ca corrects  to 9.5. - no intervention needed at this time  T2DM Home meds include metformin 500 mg BID.  A1c on admission 5.1. -cont home metformin  Prior Hx of Asthma Patient states that she was told she does not have asthma and is no longer on symbicort 2 puffs BID, tales albuterol inhaler and nebs prn at home. Has not used recently. -will hold albuterol while inpatient, as patient is not taking  Hypothyroidism: Stable. Takes synthroid 250 mcg at home.  Last TSH 2.282 on 09/11/2017. -cont home synthroid 250 mcg   Hx of Seizures  Last seizure in 1972. No longer on meds. - no intervention needed  Cirrhosis of the liver without ascites AST 49, ALT 25, TBili 2.3 on admission.  Denies history of ascites and paracentesis. - avoid hepatotoxic agents   FEN/GI: Regular Prophylaxis: no pharmacologic ppx; SCDs   Disposition: Continued inpatient management   Subjective:  No acute events following admission. Endorses continued coughing, however no sputum production since yesterday. Tension-like HA on her bilateral temporal areas, improved from last night.   Objective: Temp:  [98.1 F (36.7 C)-99.9 F (37.7 C)] 98.4 F (36.9 C) (08/21 0513) Pulse Rate:  [67-94] 67 (08/21 0513) Resp:  [15-24] 15 (08/21 0513) BP: (106-148)/(59-85) 106/59 (08/21 0513) SpO2:  [95 %-99 %] 96 % (08/21 0513) Weight:  [111.3 kg-111.7 kg] 111.7 kg (08/21 0058) Physical Exam: General: Alert, NAD HEENT: NCAT, MMM, oropharynx nonerythematous  Cardiac: RRR no m/g/r Lungs: Clear bilaterally with exception of bibasilar crackles, no increased WOB, on RA  Abdomen: soft, non-tender, non-distended, normoactive BS Msk: Moves all extremities spontaneously  Ext: Warm, dry, 2+ distal pulses, no edema   Laboratory: Recent Labs  Lab 12/28/17 1949 12/29/17 0501  WBC 1.0* 1.2*  HGB 10.8* 9.8*  HCT 32.6* 29.5*  PLT 43* 50*   Recent Labs  Lab 12/28/17 1949 12/29/17 0501  NA 139 137  K 3.8 3.4*  CL 111 113*  CO2 20*  20*  BUN 8 7  CREATININE 0.63 0.73  CALCIUM 8.6* 7.9*  PROT 7.3  --   BILITOT 2.3*  --   ALKPHOS 110  --   ALT 25  --   AST 49*  --   GLUCOSE 109* 113*     Imaging/Diagnostic Tests: Dg Chest 2 View  Result Date: 12/28/2017 CLINICAL DATA:  Short of breath EXAM: CHEST - 2 VIEW COMPARISON:  08/04/2016, CT 09/11/2017, radiograph 04/05/2017, 11/16/2015 FINDINGS: Chronic interstitial opacity at the left greater than right lung bases. No focal consolidation or pleural effusion. Stable cardiomediastinal silhouette. Low lung volume. No pneumothorax. IMPRESSION: Low lung volumes with chronic interstitial opacity in the left greater than right lung base. No acute interval change as compared with prior studies. Electronically Signed   By: Donavan Foil M.D.   On: 12/28/2017 21:22    Patriciaann Clan, DO 12/29/2017, 9:40 AM PGY-1, [facility] Intern pager: [phone], text pages welcome

## 2017-12-29 NOTE — Evaluation (Addendum)
Physical Therapy Evaluation Patient Details Name: BHAVANA KADY MRN: [id number] DOB: 07/20/1965 Today's Date: 12/29/2017   History of Present Illness  Pt is a 52 y/o female admitted secondary to fever, chills, myalgias . 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  Clinical Impression  Pt presented supine in bed with HOB elevated, awake and willing to participate in therapy session. Prior to admission, pt reported that she was independent with all functional mobility and ADLs. Pt lives with her two sons who are able to provide 24/7 supervision/assistance upon d/c. Pt currently able to perform bed mobility at modified independence, transfers with supervision and ambulated in hallway pushing IV pole with min guard. Pt reported that she felt she is mostly back to her baseline except with some shortness of breath with long distance ambulation.  PT will continue to follow acutely to progress mobility as tolerated.     Follow Up Recommendations No PT follow up    Equipment Recommendations  None recommended by PT    Recommendations for Other Services       Precautions / Restrictions Precautions Precautions: None Precaution Comments: droplet Restrictions Weight Bearing Restrictions: No      Mobility  Bed Mobility Overal bed mobility: Modified Independent                Transfers Overall transfer level: Needs assistance Equipment used: None Transfers: Sit to/from Stand Sit to Stand: Supervision         General transfer comment: supervision for safety  Ambulation/Gait Ambulation/Gait assistance: Min guard Gait Distance (Feet): 150 Feet Assistive device: IV Pole Gait Pattern/deviations: Step-through pattern;Decreased stride length Gait velocity: decreased Gait velocity interpretation: 1.31 - 2.62 ft/sec, indicative of limited community ambulator General Gait Details: pt steady and cautious while  pushing IV pole; limited secondary to fatigue; min guard for safety, no LOB or need for physical assistance  Stairs            Wheelchair Mobility    Modified Rankin (Stroke Patients Only)       Balance Overall balance assessment: Needs assistance Sitting-balance support: Feet supported Sitting balance-Leahy Scale: Good     Standing balance support: During functional activity;No upper extremity supported Standing balance-Leahy Scale: Fair                               Pertinent Vitals/Pain Pain Assessment: No/denies pain    Home Living Family/patient expects to be discharged to:: Private residence Living Arrangements: Children Available Help at Discharge: Family;Available 24 hours/day Type of Home: House Home Access: Stairs to enter   [employer] of Steps: 3 Home Layout: One level Home Equipment: Walker - 2 wheels;Tub bench      Prior Function Level of Independence: Independent               Hand Dominance        Extremity/Trunk Assessment   Upper Extremity Assessment Upper Extremity Assessment: Overall WFL for tasks assessed    Lower Extremity Assessment Lower Extremity Assessment: Overall WFL for tasks assessed       Communication   Communication: No difficulties  Cognition Arousal/Alertness: Awake/alert Behavior During Therapy: WFL for tasks assessed/performed Overall Cognitive Status: Within Functional Limits for tasks assessed  General Comments      Exercises     Assessment/Plan    PT Assessment Patient needs continued PT services  PT Problem List Decreased activity tolerance;Decreased balance;Decreased mobility;Decreased safety awareness;Decreased knowledge of use of DME;Decreased coordination       PT Treatment Interventions DME instruction;Stair training;Gait training;Functional mobility training;Therapeutic activities;Therapeutic exercise;Balance  training;Neuromuscular re-education;Patient/family education    PT Goals (Current goals can be found in the Care Plan section)  Acute Rehab PT Goals Patient Stated Goal: return home PT Goal Formulation: With patient Time For Goal Achievement: 01/12/18 Potential to Achieve Goals: Good    Frequency Min 3X/week   Barriers to discharge        Co-evaluation               AM-PAC PT "6 Clicks" Daily Activity  Outcome Measure Difficulty turning over in bed (including adjusting bedclothes, sheets and blankets)?: None Difficulty moving from lying on back to sitting on the side of the bed? : None Difficulty sitting down on and standing up from a chair with arms (e.g., wheelchair, bedside commode, etc,.)?: A Little Help needed moving to and from a bed to chair (including a wheelchair)?: None Help needed walking in hospital room?: A Little Help needed climbing 3-5 steps with a railing? : A Little 6 Click Score: 21    End of Session   Activity Tolerance: Patient tolerated treatment well Patient left: in chair;with call bell/phone within reach Nurse Communication: Mobility status PT Visit Diagnosis: Other abnormalities of gait and mobility (R26.89)    Time: 4287-6811 PT Time Calculation (min) (ACUTE ONLY): 13 min   Charges:   PT Evaluation $PT Eval Moderate Complexity: [place], [place], [place] 12/29/2017, 5:01 PM

## 2017-12-29 NOTE — Plan of Care (Signed)
  Problem: Education: Goal: Knowledge of General Education information will improve Description Including pain rating scale, medication(s)/side effects and non-pharmacologic comfort measures Outcome: Progressing   Problem: Elimination: Goal: Will not experience complications related to urinary retention Outcome: Progressing

## 2017-12-30 LAB — CBC WITH DIFFERENTIAL/PLATELET
Abs Immature Granulocytes: 0.1 10*3/uL (ref 0.0–0.1)
BASOS PCT: 1 %
Basophils Absolute: 0 10*3/uL (ref 0.0–0.1)
Eosinophils Absolute: 0.1 10*3/uL (ref 0.0–0.7)
Eosinophils Relative: 3 %
HEMATOCRIT: 32.2 % — AB (ref 36.0–46.0)
Hemoglobin: 11 g/dL — ABNORMAL LOW (ref 12.0–15.0)
IMMATURE GRANULOCYTES: 2 %
LYMPHS ABS: 0.3 10*3/uL — AB (ref 0.7–4.0)
Lymphocytes Relative: 14 %
MCH: 29.6 pg (ref 26.0–34.0)
MCHC: 34.2 g/dL (ref 30.0–36.0)
MCV: 86.6 fL (ref 78.0–100.0)
MONOS PCT: 10 %
Monocytes Absolute: 0.2 10*3/uL (ref 0.1–1.0)
NEUTROS ABS: 1.8 10*3/uL (ref 1.7–7.7)
NEUTROS PCT: 70 %
PLATELETS: 49 10*3/uL — AB (ref 150–400)
RBC: 3.72 MIL/uL — AB (ref 3.87–5.11)
RDW: 14 % (ref 11.5–15.5)
WBC: 2.5 10*3/uL — ABNORMAL LOW (ref 4.0–10.5)

## 2017-12-30 LAB — COMPREHENSIVE METABOLIC PANEL
ALBUMIN: 2.8 g/dL — AB (ref 3.5–5.0)
ALT: 25 U/L (ref 0–44)
AST: 48 U/L — AB (ref 15–41)
Alkaline Phosphatase: 116 U/L (ref 38–126)
Anion gap: 5 (ref 5–15)
BUN: 7 mg/dL (ref 6–20)
CHLORIDE: 110 mmol/L (ref 98–111)
CO2: 22 mmol/L (ref 22–32)
CREATININE: 0.68 mg/dL (ref 0.44–1.00)
Calcium: 8.7 mg/dL — ABNORMAL LOW (ref 8.9–10.3)
GFR calc Af Amer: >60 mL/min (ref >60)
GLUCOSE: 152 mg/dL — AB (ref 70–99)
POTASSIUM: 3.6 mmol/L (ref 3.5–5.1)
Sodium: 137 mmol/L (ref 135–145)
Total Bilirubin: 3.2 mg/dL — ABNORMAL HIGH (ref 0.3–1.2)
Total Protein: 7 g/dL (ref 6.5–8.1)

## 2017-12-30 LAB — URINE CULTURE

## 2017-12-30 MED ORDER — SALINE SPRAY 0.65 % NA SOLN
1.0000 | NASAL | Status: DC | PRN
  Filled 2017-12-30: qty 44

## 2017-12-30 MED ORDER — ACETAMINOPHEN 325 MG PO TABS
325.0000 mg | ORAL_TABLET | Freq: Once | ORAL | Status: AC
  Administered 2017-12-30: 325 mg via ORAL
  Filled 2017-12-30: qty 1

## 2017-12-30 MED ORDER — ACETAMINOPHEN 325 MG PO TABS
325.0000 mg | ORAL_TABLET | Freq: Four times a day (QID) | ORAL | Status: AC | PRN
  Administered 2017-12-30 – 2018-01-03 (×3): 325 mg via ORAL
  Filled 2017-12-30 (×4): qty 1

## 2017-12-30 NOTE — Progress Notes (Signed)
Physical Therapy Treatment Patient Details Name: Holly Mueller MRN: [id number] DOB: 27-Sep-1965 Today's Date: 12/30/2017    History of Present Illness Pt is a 52 y/o female admitted secondary to fever, chills, myalgias . 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    PT Comments    Pt performed gait training and functional mobility during session this pm including stair training.  She presents with mild unsteadiness that will likely clear up with OOB mobility.  Educated patient to ambulate 3x daily during hospitalization with staff present.  Rn to place order for mobility tech.  No PT f/u remains appropriate.      Follow Up Recommendations  No PT follow up     Equipment Recommendations  None recommended by PT    Recommendations for Other Services       Precautions / Restrictions Precautions Precautions: None Precaution Comments: droplet/neutropenic Restrictions Weight Bearing Restrictions: No    Mobility  Bed Mobility Overal bed mobility: Modified Independent                Transfers Overall transfer level: Modified independent Equipment used: None Transfers: Sit to/from Stand Sit to Stand: Modified independent (Device/Increase time)            Ambulation/Gait Ambulation/Gait assistance: Min guard Gait Distance (Feet): 450 Feet Assistive device: None(with pushing IV pole patient with minor loss in balance but is able to self correct, min guard for safety.  ) Gait Pattern/deviations: Step-through pattern;Decreased stride length Gait velocity: decreased   General Gait Details: No AD use, minor unsteadiness, cues for reciprocal armswing and weight shifting.     Stairs Stairs: Yes Stairs assistance: Min guard Stair Management: One rail Right Number of Stairs: 3 General stair comments: cues for sequencing and safety.     Wheelchair Mobility    Modified Rankin (Stroke Patients  Only)       Balance Overall balance assessment: Needs assistance   Sitting balance-Leahy Scale: Good       Standing balance-Leahy Scale: Fair                              Cognition Arousal/Alertness: Awake/alert Behavior During Therapy: WFL for tasks assessed/performed Overall Cognitive Status: Within Functional Limits for tasks assessed                                        Exercises      General Comments        Pertinent Vitals/Pain Pain Assessment: No/denies pain    Home Living                      Prior Function            PT Goals (current goals can now be found in the care plan section) Acute Rehab PT Goals Patient Stated Goal: return home Potential to Achieve Goals: Good Progress towards PT goals: Progressing toward goals    Frequency    Min 3X/week      PT Plan Current plan remains appropriate    Co-evaluation              AM-PAC PT "6 Clicks" Daily Activity  Outcome Measure  Difficulty turning over in bed (including adjusting bedclothes, sheets and blankets)?: None Difficulty moving from lying on back to sitting on  the side of the bed? : None Difficulty sitting down on and standing up from a chair with arms (e.g., wheelchair, bedside commode, etc,.)?: A Little Help needed moving to and from a bed to chair (including a wheelchair)?: None Help needed walking in hospital room?: A Little Help needed climbing 3-5 steps with a railing? : A Little 6 Click Score: 21    End of Session Equipment Utilized During Treatment: Gait belt Activity Tolerance: Patient tolerated treatment well Patient left: in chair;with call bell/phone within reach Nurse Communication: Mobility status PT Visit Diagnosis: Other abnormalities of gait and mobility (R26.89)     Time: 8875-7972 PT Time Calculation (min) (ACUTE ONLY): 10 min  Charges:  $Gait Training: 8-22 mins                     Governor Rooks, PTA pager  [place] 12/30/2017, 6:09 PM

## 2017-12-30 NOTE — Progress Notes (Signed)
Family Medicine Teaching Service Daily Progress Note Intern Pager: [phone]  Patient name: Holly Mueller Medical record number: [id number] Date of birth: [birth year] Age: 52 y.o. Gender: female  Primary Care Provider: [name], MD Consultants: Oncology-phone only  Code Status: partial   Pt Overview and Major Events to Date:  Admitted 8/20   Assessment and Plan: Holly Mueller a 52 y.o.femalepresenting with fever, chills, myalgias. 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  Neutropenic fever:  Pt notes improvement in her cough. Continues to have a sinus pressure HA. Afebrile since admission, and hemodynamically stable.  Physical exam notable for bibasilar crackles and frequent coughing. WBC 2.5 this am, from 1.2. Lactic acid trended to wnl. ANC 1800, from 400. Blood cultures negative for 24 hours. Urine cultures pending.  Likely secondary to viral URI, given presentation of nasal congestion, cough with sputum production, and sick contacts at home.  We discuss continuing Granix 300 mcg daily with Oncology today for further goal expectations, started per recommendation of Dr. Jana Hakim yesterday. Even though have low suspicion blood cultures will return positive at this point, would like to continue cefepime for an additional day until blood cultures come back negative at 2 days. -Vitals per routine -Continue cefepime for neutropenic fever - Discuss Granix 300 mcg w/ Oncology  -Continue to follow blood and urine cultures --Nasal saline spray  --Tylenol 367m q 6 for HA  -PT evaluation 8/21: no further PT follow up needed   Pancytopenia:  WBC 2.5. Hgb 11 today (from 9.8), 10.8 on admission, platelets 49. Denies hematochezia, hematuria, hemoptysis. Has had extensive workup for leukopenia and thrombocytopenia in the past and follows with Rheumatology and Hematology.Workup has been negative and there is not  a known cause for pancytopenia at this time. HIV non-reactive.  - CBC in AM - Droplet precautions - Recommend f/u outpatient with GI for routine colonoscopy -Needs Rheum f/u outpatient    Hypoalbuminemia 2.9 on admission. Likely 2/2 acute infection. Ca corrects to 9.5. - no intervention needed at this time  T2DM Home meds include metformin 500 mg BID.A1c on admission 5.1. -cont home metformin  PriorHx ofAsthma Patient states that she was told she does not have asthma and is no longer onsymbicort 2 puffs BID,talesalbuterol inhaler and nebs prn at home.Has not used recently. -will hold albuterol while inpatient, as patient is not taking  Hypothyroidism: Stable. Takes synthroid 250 mcg at home.  Last TSH 2.282 on 09/11/2017. -cont home synthroid 250 mcg   Hx of Seizures Last seizure in1972. No longer on meds. - no intervention needed  Cirrhosis of the liver without ascites AST 48, ALT 25, TBili 3.2, 2.3 on admission. Denies history of ascites and paracentesis. - avoid hepatotoxic agents   FEN/GI:Regular Prophylaxis:no pharmacologic ppx; SCDs   Disposition: Continued care for neutropenia   Subjective:  Patient endorsed headache and general body aches overnight, given Tylenol with improvement. This morning, continue cough with white to clear sputum production that has improved since yesterday.  Continues to have sinus pressure with frontal bilateral headache, much better from last night.   Objective: Temp:  [97.7 F (36.5 C)-98.3 F (36.8 C)] 97.9 F (36.6 C) (08/22 0557) Pulse Rate:  [68-82] 81 (08/22 0557) Resp:  [18] 18 (08/22 0557) BP: (116-155)/(68-88) 129/68 (08/22 0557) SpO2:  [95 %-98 %] 95 % (08/22 0557) Physical Exam: General: Alert, NAD HEENT: NCAT, MMM, oropharynx non-erythematous, frontal sinus pressure with palpation.   Cardiac: RRR no m/g/r Lungs: Clear  with exception of bibasilar crackles, no increased WOB, cough with white sputum  production during exam  Abdomen: soft, non-tender, non-distended, normoactive BS Msk: Moves all extremities spontaneously  Ext: Warm, dry, 2+ distal pulses, no edema    Laboratory: Recent Labs  Lab 12/28/17 1949 12/29/17 0501  WBC 1.0* 1.2*  HGB 10.8* 9.8*  HCT 32.6* 29.5*  PLT 43* 50*   Recent Labs  Lab 12/28/17 1949 12/29/17 0501  NA 139 137  K 3.8 3.4*  CL 111 113*  CO2 20* 20*  BUN 8 7  CREATININE 0.63 0.73  CALCIUM 8.6* 7.9*  PROT 7.3  --   BILITOT 2.3*  --   ALKPHOS 110  --   ALT 25  --   AST 49*  --   GLUCOSE 109* 113*    Imaging/Diagnostic Tests: .No results found.  Patriciaann Clan, DO 12/30/2017, 6:12 AM PGY-1, [place] Intern pager: [phone], text pages welcome

## 2017-12-31 ENCOUNTER — Other Ambulatory Visit: Payer: Self-pay

## 2017-12-31 ENCOUNTER — Encounter (HOSPITAL_COMMUNITY): Payer: Self-pay | Admitting: Licensed Clinical Social Worker

## 2017-12-31 LAB — CBC WITH DIFFERENTIAL/PLATELET
Basophils Absolute: 0.1 10*3/uL (ref 0.0–0.1)
Basophils Relative: 3 %
EOS PCT: 6 %
Eosinophils Absolute: 0.1 10*3/uL (ref 0.0–0.7)
HEMATOCRIT: 30.2 % — AB (ref 36.0–46.0)
Hemoglobin: 10.3 g/dL — ABNORMAL LOW (ref 12.0–15.0)
LYMPHS ABS: 0.5 10*3/uL — AB (ref 0.7–4.0)
Lymphocytes Relative: 22 %
MCH: 29.3 pg (ref 26.0–34.0)
MCHC: 34.1 g/dL (ref 30.0–36.0)
MCV: 86 fL (ref 78.0–100.0)
MONOS PCT: 11 %
Monocytes Absolute: 0.3 10*3/uL (ref 0.1–1.0)
NEUTROS ABS: 1.3 10*3/uL — AB (ref 1.7–7.7)
Neutrophils Relative %: 58 %
Platelets: 59 10*3/uL — ABNORMAL LOW (ref 150–400)
RBC: 3.51 MIL/uL — AB (ref 3.87–5.11)
RDW: 14.2 % (ref 11.5–15.5)
WBC: 2.3 10*3/uL — ABNORMAL LOW (ref 4.0–10.5)

## 2017-12-31 LAB — BASIC METABOLIC PANEL
ANION GAP: 4 — AB (ref 5–15)
BUN: 6 mg/dL (ref 6–20)
CALCIUM: 8.7 mg/dL — AB (ref 8.9–10.3)
CO2: 23 mmol/L (ref 22–32)
Chloride: 111 mmol/L (ref 98–111)
Creatinine, Ser: 0.57 mg/dL (ref 0.44–1.00)
GFR calc Af Amer: >60 mL/min (ref >60)
GFR calc non Af Amer: >60 mL/min (ref >60)
GLUCOSE: 137 mg/dL — AB (ref 70–99)
Potassium: 3.4 mmol/L — ABNORMAL LOW (ref 3.5–5.1)
Sodium: 138 mmol/L (ref 135–145)

## 2017-12-31 MED ORDER — AMOXICILLIN-POT CLAVULANATE 875-125 MG PO TABS
1.0000 | ORAL_TABLET | Freq: Two times a day (BID) | ORAL | Status: DC
  Administered 2017-12-31 – 2018-01-01 (×3): 1 via ORAL
  Filled 2017-12-31 (×3): qty 1

## 2017-12-31 NOTE — Clinical Social Work Note (Signed)
Clinical Social Work Assessment  Patient Details  Name: Holly Mueller MRN: [id number] Date of Birth: [birth year]  Date of referral:  12/31/17               Reason for consult:  Housing Concerns/Homelessness, [employer], Ecologist sought to share information with:  Family Supports Permission granted to share information::  Yes, Verbal Permission Granted   Housing/Transportation Living arrangements for the past 2 months:  Single Family Home Source of Information:  Patient Patient Interpreter Needed:  None Criminal Activity/Legal Involvement Pertinent to Current Situation/Hospitalization:  No - Comment as needed Significant Relationships:  Adult Children, Friend, Other Family Members Lives with:  Roommate, Adult Children Do you feel safe going back to the place where you live?  Yes Need for family participation in patient care:  No (Coment)  Care giving concerns:  Social determinants of health questions completed. Pt cites needs with paying rent as she works a variable schedule and is currently in hospital, support with accessing food and keeping it fresh, and support with utilities. See full assessment and resources below.   Social Worker assessment / plan: CSW met with pt at bedside. 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. Pt lives in a home with a roommate and her two adult sons (46 and 37). Pt states concerns with accessing transportation to get to appointments, has been approved for Medicaid transportation so was given transportation packets with Medicaid transit number. Pt also states concerns with her housing stating she owes money to her landlord and is worried since she has been in the hospital that she will not be able work enough shifts to pay her $200 for rent when it is due. Pt home also does not have a consistently working refrigerator meaning that her  food often does not stay fresh and spoils when the fridge breaks, pt landlord has stated he will not fix it until pt pays her outstanding rent owed. Pt receives SNAP and feels that she doesn't have enough to make it to the end of the month with her groceries. The stress weighs on pt and she says she feels her stress contributes to her health being off at times.   Pt amenable to resources and community referrals. Upon record review pt has been supported by outpatient [facility] CSW Casimer Lanius. Pt given the following resources: [employer], [place] [employer], [place] and [employer] for [employer] and food pantries in [place], Therapist, sports for assistance with home items, information about double dollars at [employer] for [employer] and transportation support packet for [employer]. Handoff made for weekend CSW that pt may need bus passes to get home.   Employment status:  Systems developer information:  Self Pay (Medicaid Pending) PT Recommendations:  No Follow Up Information / Referral to community resources:  Other (Comment Required)- [employer] for food, [employer], information on double [employer] at [place] and information to [employer] and [employer]. Transportation packet provided.   Patient/Family's Response to care: Pt amenable to meeting with CSW and discussing needs. Accepting of resources. Pt has phone and is able to make calls if she desires to programs, will look over information and f/u with programs she is interested in.   Patient/Family's Understanding  of and Emotional Response to Diagnosis, Current Treatment, and Prognosis:  Pt understanding of diagnosis, current treatment and prognosis. Pt amenable to CSW visit, and had lengthy discussion with CSW regarding her needs and desire for resources. Pt obviously overwhelmed with multiple  current needs as evidenced by tears and pt admitting to stress, pt does have can do attitude and is determined to do the best she can to manage things, she is aware CSW is available prior to discharge to provide any additional support.    Emotional Assessment Appearance:  Appears stated age Attitude/Demeanor/Rapport:  Crying, Engaged, Gracious Affect (typically observed):  Accepting, Appropriate, Overwhelmed, Pleasant Orientation:  Oriented to Self, Oriented to Place, Oriented to  Time, Oriented to Situation Alcohol / Substance use:  Alcohol Use Psych involvement (Current and /or in the community):  No (Comment)  Discharge Needs  Concerns to be addressed:  Financial / Insurance Concerns, Coping/Stress Concerns Readmission within the last 30 days:  No Current discharge risk:  Inadequate Financial Supports Barriers to Discharge:  Continued Medical Work up   [employer], [place] 12/31/2017, 4:39 PM

## 2017-12-31 NOTE — Progress Notes (Signed)
Family Medicine Teaching Service Daily Progress Note Intern Pager: [phone]  Patient name: Holly Mueller Medical record number: [id number] Date of birth: 1965-09-13 Age: 52 y.o. Gender: female  Primary Care Provider: [name], MD Consultants: Oncology phone only Code Status: partial   Pt Overview and Major Events to Date:  Admitted 8/20   Assessment and Plan: Holly Ayllon Sumneris a 52 y.o.femalepresenting with fever, chills, myalgias. PMH is significant for Leukopenia 2/2 non-alcoholic cirrhosis, K0SU, Hypothyroidism, Asthma, GERD, Thrombocytopenia.  Neutropenic fever:  Pt continues to report sinus pressure, cough, and HA with intermittent sharp abdominal pain. Has remained afebrile and hemodynamically stable. WBC 2.3, ANC 1300. Blood cultures negative for 48 hours.  Urine culture showing multiple species present, but U/A clear. Fever source uncertain, likely secondary to viral URI vs sinusitis.  Discussed Granix therapy with oncology yesterday, stated this could be discontinued given her white blood count improvement. Will transition to oral Augmentin today for further treatment of possible sinusitis, likely viral, however given depressed immune system, would rather have coverage.   - DC cefepime, transition to Augmentin oral 875-125 q 12, monitor overnight  - Follow-up blood and urine cultures -Nasal saline spray -Tylenol as needed -CBC in the am   Pancytopenia:WBC 2.3. Hgb10.3 today (from 11), platelets 59.  Extensive work-up outpatient has come completed for rheumatology and hematology, no identified cause for pancytopenia at this time.   - CBC in AM - Droplet precautions -Recommend f/u outpatient with GI for routine colonoscopy -Needs Rheum f/u outpatient   -SW consult, per PCP patient has had difficulty making appt due to transportation   T2DMHome meds include metformin 500 mg BID.A1c on admission 5.1. -cont home metformin  PriorHx ofAsthma Patient  states that she was told she does not have asthma and is no longer onsymbicort 2 puffs BID,talesalbuterol inhaler and nebs prn at home.Has not used recently. -will hold albuterol while inpatient, as patient is not taking  Hypothyroidism: Stable. Takes synthroid 250 mcg at home.Last TSH 2.282 on 09/11/2017. -cont home synthroid250 mcg  Hx of Seizures Last seizure in1972. No longer on meds. - no intervention needed  Cirrhosis of the liver without ascites AST 48, ALT 25, TBili 3.2, 2.3 on admission. Denies history of ascites and paracentesis. - avoid hepatotoxic agents   FEN/GI:Regular Prophylaxis:no pharmacologic ppx; SCDs  Disposition: Likely discharge tomorrow following oral antibiotic therapy today.  Subjective:  Patient continues to have a pressure-like headache that starts from her occipital region and comes to the front, but improved from overnight.  Still has sinus pressure and cough, unchanged from yesterday.  Endorses sharp abdominal pain that goes across like a band over her umbilicus, with episodes over yesterday lasting a few seconds to a minute.  No longer endorsing myalgias.  Objective: Temp:  [97.7 F (36.5 C)-98.8 F (37.1 C)] 97.7 F (36.5 C) (08/23 0531) Pulse Rate:  [70-82] 70 (08/23 0531) Resp:  [16-18] 16 (08/23 0531) BP: (103-139)/(54-76) 103/54 (08/23 0531) SpO2:  [94 %-97 %] 94 % (08/23 0531) Physical Exam: General: Alert, NAD HEENT: NCAT, MMM, oropharynx nonerythematous, frontal sinus pressure to palpation   Cardiac: RRR no m/g/r Lungs: Clear bilaterally with exception of bibasilar crackles, no increased WOB, on RA Abdomen: soft, non-tender, non-distended, normoactive BS Msk: Moves all extremities spontaneously, no tendered to cervical spine  Ext: Warm, dry, 2+ distal pulses, no edema   Laboratory: Recent Labs  Lab 12/28/17 1949 12/29/17 0501 12/30/17 0610  WBC 1.0* 1.2* 2.5*  HGB 10.8* 9.8* 11.0*  HCT 32.6*  29.5* 32.2*  PLT  43* 50* 49*   Recent Labs  Lab 12/28/17 1949 12/29/17 0501 12/30/17 0610  NA 139 137 137  K 3.8 3.4* 3.6  CL 111 113* 110  CO2 20* 20* 22  BUN _0 CREATININE 0.63 0.73 0.68  CALCIUM 8.6* 7.9* 8.7*  PROT 7.3  --  7.0  BILITOT 2.3*  --  3.2*  ALKPHOS 110  --  116  ALT 25  --  25  AST 49*  --  48*  GLUCOSE 109* 113* 152*    Imaging/Diagnostic Tests: No results found.  Holly Clan, DO 12/31/2017, 6:08 AM PGY-1, [place] Intern pager: [phone], text pages welcome

## 2017-12-31 NOTE — Care Management Note (Signed)
Case Management Note  Patient Details  Name: Holly Mueller MRN: [id number] Date of Birth: April 05, 1966  Subjective/Objective:                    Action/Plan:  Spoke to patient regarding getting prescriptions filled. Patient states she has active Medicaid and can afford co pay .  Expected Discharge Date:  12/31/17               Expected Discharge Plan:  Home/Self Care  In-House Referral:     Discharge planning Services  CM Consult  Post Acute Care Choice:  NA Choice offered to:  Patient  DME Arranged:  N/A DME Agency:  NA  HH Arranged:  NA HH Agency:  NA  Status of Service:  Completed, signed off  If discussed at [place] of Stay Meetings, dates discussed:    Additional Comments:  Marilu Favre, RN 12/31/2017, 3:11 PM

## 2018-01-01 ENCOUNTER — Inpatient Hospital Stay (HOSPITAL_COMMUNITY): Payer: Self-pay

## 2018-01-01 DIAGNOSIS — R05 Cough: Secondary | ICD-10-CM

## 2018-01-01 DIAGNOSIS — R509 Fever, unspecified: Secondary | ICD-10-CM

## 2018-01-01 LAB — CBC WITH DIFFERENTIAL/PLATELET
BASOS ABS: 0 10*3/uL (ref 0.0–0.1)
Basophils Relative: 2 %
EOS ABS: 0.1 10*3/uL (ref 0.0–0.7)
EOS PCT: 6 %
HCT: 31 % — ABNORMAL LOW (ref 36.0–46.0)
Hemoglobin: 10.3 g/dL — ABNORMAL LOW (ref 12.0–15.0)
Lymphocytes Relative: 28 %
Lymphs Abs: 0.5 10*3/uL — ABNORMAL LOW (ref 0.7–4.0)
MCH: 28.9 pg (ref 26.0–34.0)
MCHC: 33.2 g/dL (ref 30.0–36.0)
MCV: 86.8 fL (ref 78.0–100.0)
MONO ABS: 0.2 10*3/uL (ref 0.1–1.0)
Monocytes Relative: 12 %
Neutro Abs: 1 10*3/uL — ABNORMAL LOW (ref 1.7–7.7)
Neutrophils Relative %: 52 %
PLATELETS: 62 10*3/uL — AB (ref 150–400)
RBC: 3.57 MIL/uL — AB (ref 3.87–5.11)
RDW: 14 % (ref 11.5–15.5)
WBC: 1.8 10*3/uL — AB (ref 4.0–10.5)

## 2018-01-01 LAB — BASIC METABOLIC PANEL
ANION GAP: 7 (ref 5–15)
BUN: 7 mg/dL (ref 6–20)
CALCIUM: 8.7 mg/dL — AB (ref 8.9–10.3)
CO2: 23 mmol/L (ref 22–32)
Chloride: 108 mmol/L (ref 98–111)
Creatinine, Ser: 0.58 mg/dL (ref 0.44–1.00)
GFR calc Af Amer: >60 mL/min (ref >60)
GLUCOSE: 124 mg/dL — AB (ref 70–99)
Potassium: 3.7 mmol/L (ref 3.5–5.1)
SODIUM: 138 mmol/L (ref 135–145)

## 2018-01-01 MED ORDER — PIPERACILLIN-TAZOBACTAM 3.375 G IVPB
3.3750 g | Freq: Three times a day (TID) | INTRAVENOUS | Status: DC
  Administered 2018-01-02 – 2018-01-03 (×5): 3.375 g via INTRAVENOUS
  Filled 2018-01-01 (×5): qty 50

## 2018-01-01 MED ORDER — OXYCODONE HCL 5 MG PO TABS
2.5000 mg | ORAL_TABLET | Freq: Once | ORAL | Status: AC
  Administered 2018-01-01: 2.5 mg via ORAL
  Filled 2018-01-01: qty 1

## 2018-01-01 MED ORDER — FUROSEMIDE 10 MG/ML IJ SOLN
40.0000 mg | Freq: Once | INTRAMUSCULAR | Status: AC
  Administered 2018-01-01: 40 mg via INTRAVENOUS
  Filled 2018-01-01: qty 4

## 2018-01-01 MED ORDER — PROMETHAZINE HCL 12.5 MG PO TABS
6.2500 mg | ORAL_TABLET | Freq: Four times a day (QID) | ORAL | Status: DC | PRN
  Filled 2018-01-01: qty 1

## 2018-01-01 MED ORDER — PIPERACILLIN-TAZOBACTAM 3.375 G IVPB 30 MIN
3.3750 g | Freq: Once | INTRAVENOUS | Status: AC
  Administered 2018-01-01: 3.375 g via INTRAVENOUS
  Filled 2018-01-01 (×2): qty 50

## 2018-01-01 MED ORDER — ALBUTEROL SULFATE (2.5 MG/3ML) 0.083% IN NEBU
2.5000 mg | INHALATION_SOLUTION | Freq: Once | RESPIRATORY_TRACT | Status: AC
  Administered 2018-01-01: 2.5 mg via RESPIRATORY_TRACT
  Filled 2018-01-01: qty 3

## 2018-01-01 NOTE — Progress Notes (Addendum)
Family Medicine Teaching Service Daily Progress Note Intern Pager: [phone]  Patient name: Holly Mueller Medical record number: [id number] Date of birth: 02-20-1966 Age: 52 y.o. Gender: female  Primary Care Provider: [name], MD Consultants: Oncology -phone only Code Status: partial   Pt Overview and Major Events to Date:  Admitted 8/20   Assessment and Plan: Holly Mueller Sumneris a 52 y.o.femalepresenting with fever, chills, myalgias. 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  Neutropenic fever:  Afebrile overnight with stable vitals.  WBC has downtrended 2.5>2.3>1.8 and ANC 1300>1000 this AM.  She reports still having some cough and congestion, finds it difficult to lay flat in her bed.  Question if this is congestion vs fluid overload.   Bcx negative for 48 hours.  Urine culture showing multiple species present, but U/A clear. Fever source uncertain, likely secondary to viral URI vs sinusitis.   - obtain CXR, given breathing treatment and IV lasix 40 mg x1 dose; will reassess this afternoon.  If appears well will proceed with plan for d/c home.  - continue po Augmentin  -Nasal saline spray -Tylenol prn  -CBC in the am   Pancytopenia:WBC 2.3>1.8. Hgbstable at 10.3 today, platelets 62.  Extensive work-up outpatient has come completed for rheumatology and hematology, no identified cause for pancytopenia at this time.   - Droplet precautions -Recommend f/u outpatient with GI for routine colonoscopy -Needs Rheum f/u outpatient   -SW consult, per PCP patient has had difficulty making appt due to transportation   T2DMHome meds include metformin 500 mg BID.A1c on admission 5.1. -cont home metformin  PriorHx ofAsthma Patient states that she was told she does not have asthma and is no longer onsymbicort 2 puffs BID,talesalbuterol inhaler and nebs prn at home.Has not used recently. -will  hold albuterol while inpatient, as patient is not taking  Hypothyroidism: Stable. Takes synthroid 250 mcg at home.Last TSH 2.282 on 09/11/2017. -cont home synthroid250 mcg  Hx of Seizures Last seizure in1972. No longer on meds. - no intervention needed  Cirrhosis of the liver without ascites AST 48, ALT 25, TBili 3.2, 2.3 on admission. Denies history of ascites and paracentesis. - avoid hepatotoxic agents   FEN/GI:Regular Prophylaxis:no pharmacologic ppx; SCDs  Disposition: Monitor this afternoon for improvement   Subjective:  Patient reporting mild cough , tolerating po and on oral antibiotics has remained afebrile overnight with no acute events.  Feels like she has improved and is ready to go home.   Objective: Temp:  [98.1 F (36.7 C)-98.6 F (37 C)] 98.2 F (36.8 C) (08/24 0420) Pulse Rate:  [69-70] 70 (08/24 0420) Resp:  [17-18] 18 (08/24 0420) BP: (107-116)/(65-81) 107/71 (08/24 0420) SpO2:  [94 %-97 %] 94 % (08/24 0420)   Physical Exam: General: Alert, NAD  HEENT: NCAT, MMM, o/p clear  Cardiac: RRR no MRG  Lungs: CTAB, no increased WOB, on RA Abdomen: soft, NTND, +bs  Msk: Moves all extremities spontaneously  Ext: Warm, dry, 2+ distal pulses, no edema   Laboratory: Recent Labs  Lab 12/30/17 0610 12/31/17 0519 01/01/18 0442  WBC 2.5* 2.3* 1.8*  HGB 11.0* 10.3* 10.3*  HCT 32.2* 30.2* 31.0*  PLT 49* 59* 62*   Recent Labs  Lab 12/28/17 1949  12/30/17 0610 12/31/17 0519 01/01/18 0442  NA 139   < > 137 138 138  K 3.8   < > 3.6 3.4* 3.7  CL 111   < > 110 111 108  CO2 20*   < >  _0 BUN 8   < > _1 CREATININE 0.63   < > 0.68 0.57 0.58  CALCIUM 8.6*   < > 8.7* 8.7* 8.7*  PROT 7.3  --  7.0  --   --   BILITOT 2.3*  --  3.2*  --   --   ALKPHOS 110  --  116  --   --   ALT 25  --  25  --   --   AST 49*  --  48*  --   --   GLUCOSE 109*   < > 152* 137* 124*   < > = values in this interval not displayed.    Imaging/Diagnostic  Tests: No results found.  Lovenia Kim, MD 01/01/2018, 9:26 AM PGY-3, [place] Intern pager: [phone], text pages welcome

## 2018-01-01 NOTE — Progress Notes (Signed)
Pharmacy Antibiotic Note  Holly Mueller is a 52 y.o. female on augmentin for concern of sinusitis. CXR shows progressive lingular airspace disease consistent with pneumonia and now to begin zosyn -WBC= 1.8, afebrile, CrCL > 100   Plan: -Zosyn 3.375gm IV q8h -Will follow renal function, cultures and clinical progress     Height: _0  (167.6 cm) Weight: 246 lb 4.1 oz (111.7 kg) IBW/kg (Calculated) : 59.3  Temp (24hrs), Avg:98.4 F (36.9 C), Min:98.2 F (36.8 C), Max:98.6 F (37 C)  Recent Labs  Lab 12/28/17 1949 12/28/17 1950 12/29/17 0501 12/30/17 0610 12/31/17 0519 01/01/18 0442  WBC 1.0*  --  1.2* 2.5* 2.3* 1.8*  CREATININE 0.63  --  0.73 0.68 0.57 0.58  LATICACIDVEN  --  1.45  --   --   --   --     Estimated Creatinine Clearance: 104.3 mL/min (by C-G formula based on SCr of 0.58 mg/dL).    Allergies  Allergen Reactions  . Aspirin Nausea Only    Upset stomach    Antimicrobials this admission: Zosyn 8/24>> Augmentin 8/23>>8/24 Cefepime 8/20 >> 8/23 Vancomycin 8/20 >> 8/23  Dose adjustments this admission: N/A  Microbiology results: 8/20 BCx: ngtd   Thank you for allowing pharmacy to be a part of this patient's care.  Hildred Laser, PharmD Clinical Pharmacist Please check Amion for pharmacy contact number

## 2018-01-02 DIAGNOSIS — J189 Pneumonia, unspecified organism: Secondary | ICD-10-CM

## 2018-01-02 DIAGNOSIS — Y95 Nosocomial condition: Secondary | ICD-10-CM

## 2018-01-02 LAB — CBC WITH DIFFERENTIAL/PLATELET
BAND NEUTROPHILS: 2 %
BASOS PCT: 0 %
Basophils Absolute: 0 10*3/uL (ref 0.0–0.1)
Blasts: 0 %
EOS ABS: 0.1 10*3/uL (ref 0.0–0.7)
Eosinophils Relative: 6 %
HEMATOCRIT: 35 % — AB (ref 36.0–46.0)
Hemoglobin: 11.6 g/dL — ABNORMAL LOW (ref 12.0–15.0)
LYMPHS PCT: 40 %
Lymphs Abs: 0.8 10*3/uL (ref 0.7–4.0)
MCH: 28.9 pg (ref 26.0–34.0)
MCHC: 33.1 g/dL (ref 30.0–36.0)
MCV: 87.3 fL (ref 78.0–100.0)
MONO ABS: 0.1 10*3/uL (ref 0.1–1.0)
MONOS PCT: 6 %
Metamyelocytes Relative: 0 %
Myelocytes: 0 %
NEUTROS ABS: 0.9 10*3/uL — AB (ref 1.7–7.7)
NEUTROS PCT: 46 %
NRBC: 0 /100{WBCs}
OTHER: 0 %
PROMYELOCYTES RELATIVE: 0 %
Platelets: 79 10*3/uL — ABNORMAL LOW (ref 150–400)
RBC: 4.01 MIL/uL (ref 3.87–5.11)
RDW: 14.4 % (ref 11.5–15.5)
WBC: 1.9 10*3/uL — ABNORMAL LOW (ref 4.0–10.5)

## 2018-01-02 LAB — CULTURE, BLOOD (ROUTINE X 2)
Culture: NO GROWTH
Culture: NO GROWTH
Special Requests: ADEQUATE

## 2018-01-02 NOTE — Plan of Care (Signed)
  Problem: Clinical Measurements: Goal: Respiratory complications will improve Outcome: Progressing

## 2018-01-02 NOTE — Progress Notes (Signed)
Family Medicine Teaching Service Daily Progress Note Intern Pager: [phone]  Patient name: Holly Mueller Medical record number: [id number] Date of birth: 11-03-1965 Age: 52 y.o. Gender: female  Primary Care Provider: [name], MD Consultants: Oncology -phone only Code Status: partial   Pt Overview and Major Events to Date:  Admitted 8/20    Assessment and Plan: [name] Sumneris a 52 y.o.femalepresenting with fever, chills, myalgias. PMH is significant for leukopenia 2/2 non-alcoholic cirrhosis, J4NW, hypothyroidism, asthma, GERD, thrombocytopenia.  Neutropenic fever:  Afebrile overnight with stable vitals.  WBC stable 1.8>1.9 this morning and ANC 1000>900.  Bcx negative for 48 hours.  Urine culture showing multiple species present, but U/A clear. Fever source uncertain, likely secondary to viral URI vs sinusitis.   -monitor on cbc     HAP: Upon re-evaluation yesterday afternoon, patient with increased coughing and shortness of breath, finding it difficult to lay flat in her bed.  CXR ordered which showed progressive lingular airspace disease c/w pneumonia, low lung volumes and mild pulm vasc congestion without frank edema.  Breathing treatment x1 and IV 40 mg Lasix administered with marked improvement in her breathing. Discontinued augmentin and broadened to IV zosyn for treatment of likely hospital acquired pneumonia.  Has remained on RA overnight with O2 sats >96%.  Endorses feeling better on IV antibiotics although still with some coughing fits.   -continue IV zosyn; if continues to do well, may consider transitioning to po abx tomorrow  -monitor resp status  -nasal saline spray  -tylenol prn   Pancytopenia:WBC stable.  Hgbstable at 11.6 today, platelets 79.  Extensive work-up outpatient has come completed for rheumatology and hematology, no identified cause for pancytopenia at this time.   -Droplet precautions -Recommend f/u outpatient with GI for routine  colonoscopy -Needs Rheum f/u outpatient   -SW consult, per PCP patient has had difficulty making appt due to transportation   T2DMHome meds include metformin 500 mg BID.A1c on admission 5.1. -cont home metformin  PriorHx ofAsthma Patient states that she was told she does not have asthma and is no longer onsymbicort 2 puffs BID,talesalbuterol inhaler and nebs prn at home.Has not used recently. -will hold albuterol while inpatient, as patient is not taking  Hypothyroidism: Stable. Takes synthroid 250 mcg at home.Last TSH 2.282 on 09/11/2017. -cont home synthroid250 mcg  Hx of Seizures Last seizure in1972. No longer on meds. - no intervention needed  Cirrhosis of the liver without ascites AST 48, ALT 25, TBili 3.2, 2.3 on admission. Denies history of ascites and paracentesis. - avoid hepatotoxic agents   FEN/GI:regular Prophylaxis:no pharmacologic ppx; SCDs  Disposition: pending clinical improvement   Subjective:  Patient reports improvement in SOB and cough, although still present.  She has not fevered overnight and had a good night of sleep. Feeling well rested this morning.     Objective: SpO2:  [96 %] 96 % (08/24 1225)   Physical Exam: General: Alert, oriented x4, 52 yo female, NAD HEENT: NCAT, MMM, o/p clear, no tonsillar erythema or exudate noted  Cardiac: RRR no MRG, 2+ pedal pulses Lungs: CTAB, comfortable WOB on RA Abdomen: soft, NTND, +bs  Msk: Moves all extremities spontaneously  Ext: Warm, dry, no edema or tenderness   Laboratory: Recent Labs  Lab 12/31/17 0519 01/01/18 0442 01/02/18 0412  WBC 2.3* 1.8* 1.9*  HGB 10.3* 10.3* 11.6*  HCT 30.2* 31.0* 35.0*  PLT 59* 62* 79*   Recent Labs  Lab 12/28/17 1949  12/30/17 0610 12/31/17 0519 01/01/18 0442  NA  139   < > 137 138 138  K 3.8   < > 3.6 3.4* 3.7  CL 111   < > 110 111 108  CO2 20*   < > _0 BUN 8   < > _1 CREATININE 0.63   < > 0.68 0.57 0.58  CALCIUM 8.6*   <  > 8.7* 8.7* 8.7*  PROT 7.3  --  7.0  --   --   BILITOT 2.3*  --  3.2*  --   --   ALKPHOS 110  --  116  --   --   ALT 25  --  25  --   --   AST 49*  --  48*  --   --   GLUCOSE 109*   < > 152* 137* 124*   < > = values in this interval not displayed.    Imaging/Diagnostic Tests: Dg Chest 2 View  Result Date: 01/01/2018 CLINICAL DATA:  Productive cough.  Shortness of breath. EXAM: CHEST - 2 VIEW COMPARISON:  Two-view chest x-ray 12/28/2017 FINDINGS: The heart size is normal. Lung volumes are low. Progressive lingular airspace disease worrisome for pneumonia. Mild bibasilar airspace disease otherwise likely represents atelectasis. There is mild pulmonary vascular congestion without frank edema. No significant effusions are present. The visualized soft tissues and bony thorax are unremarkable. IMPRESSION: 1. Progressive lingular airspace disease consistent with pneumonia. 2. Low lung volumes and mild pulmonary vascular congestion without frank edema. 3. Mild bibasilar airspace disease likely reflects atelectasis. Electronically Signed   By: San Morelle M.D.   On: 01/01/2018 14:16    Lovenia Kim, MD 01/02/2018, 9:01 AM PGY-3, [place] Intern pager: [phone], text pages welcome

## 2018-01-02 NOTE — Progress Notes (Signed)
ANTIBIOTIC CONSULT NOTE  Pharmacy Consult for Zosyn Indication: pneumonia  Allergies  Allergen Reactions  . Aspirin Nausea Only    Upset stomach    Patient Measurements: Height: _0  (167.6 cm) Weight: 246 lb 4.1 oz (111.7 kg) IBW/kg (Calculated) : 59.3 Adjusted Body Weight:    Vital Signs:   Intake/Output from previous day: 08/24 0701 - 08/25 0700 In: 820 [P.O.:720; IV Piggyback:100] Out: -  Intake/Output from this shift: No intake/output data recorded.  Labs: Recent Labs    12/31/17 0519 01/01/18 0442 01/02/18 0412  WBC 2.3* 1.8* 1.9*  HGB 10.3* 10.3* 11.6*  PLT 59* 62* 79*  CREATININE 0.57 0.58  --    Estimated Creatinine Clearance: 104.3 mL/min (by C-G formula based on SCr of 0.58 mg/dL). No results for input(s): VANCOTROUGH, VANCOPEAK, VANCORANDOM, GENTTROUGH, GENTPEAK, GENTRANDOM, TOBRATROUGH, TOBRAPEAK, TOBRARND, AMIKACINPEAK, AMIKACINTROU, AMIKACIN in the last 72 hours.   Microbiology:   Medical History: Past Medical History:  Diagnosis Date  . Arthritis    bursitis left hip flares-not an issue  . Diabetes mellitus without complication ([place])   . Edentulous    10-19-13 at present  . Epilepsy ([place]) in 1972   No seizures since 1972. Previously treated with phenobarbital.   . Fever 06/2017  . GERD (gastroesophageal reflux disease) 2008  . Headache(784.0)    hx of migraines   . Hypothyroidism 2008  . Lower esophageal ring 08/18/2013  . Seasonal allergies 2003  . Shortness of breath    Assessment:  ID: Bacterial vs. Viral illness - Chronic leukopenia 2/t cirrhosis. Afebrile. HIV neg. WBC 1.9, LA 1.45,  Renal WNL  Cefepime 8/20 >> 8/23 Vancomycin 8/20 >> 8/21 Metronidazole 8/20 >> 8/21 Augmentin 8/23>8/24 Zosyn 8/24>>  8/20 BCx: NGTD 8/20 UCx: multiple species present  Goal of Therapy:  Eradication of infection  Plan:  Zosyn 3.375g IV q 8 hrs. Pharmacy will sign off. Please reconsult for further dosing assitance.   Trinity Haun S.  Alford Highland, PharmD, BCPS Clinical Staff Pharmacist  Eilene Ghazi Stillinger 01/02/2018,9:50 AM

## 2018-01-02 NOTE — Progress Notes (Signed)
Family Medicine Teaching Service Daily Progress Note Intern Pager: [phone]  Patient name: Holly Mueller Medical record number: [id number] Date of birth: [birth year] Age: 52 y.o. Gender: female  Primary Care Provider: [name], MD Consultants: Oncology -phone only Code Status: partial   Pt Overview and Major Events to Date:  Admitted 8/20    Assessment and Plan: Holly Mueller a 52 y.o.femalepresenting with fever, chills, myalgias. 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  Neutropenic fever: Resolved   WBC stable 1.9>1.6 this morning, ANC 900>800. Blood Cx NGTD.  Urine culture showing multiple species present, but U/A clear. Likely source viral URI.  -cont to monitor CBC    - will consult hematology   HAP:  CXR on 8/24 suggestive of PNA. IV Zosyn started on 8/25. Overnight patient has remained afebrile. This AM states, "I am the same as I was yesterday."  Patient with a dry cough this AM. - continue IV Zosyn for now, pending hematology discussion - consider transition to Levaquin  -monitor resp status  -nasal saline spray  -tylenol prn  - tessalon perles for cough - duonebs prn for breathing  Pancytopenia:WBC decreasing today.  Hgb 10.8 and platelets 65 this AM.  Extensive work-up outpatient has come completed for rheumatology and hematology, no identified cause for pancytopenia at this time.   -Droplet precautions -Recommend f/u outpatient with GI for routine colonoscopy -Needs Rheum f/u outpatient   -SW consult, per PCP patient has had difficulty making appt due to transportation   T2DM: Chronic, well-controlled Home meds include metformin 500 mg BID.A1c on admission 5.1. -cont home metformin  PriorHx ofAsthma Patient states that she was told she does not have asthma and is no longer onsymbicort,talesalbuterol inhaler and nebs prn at home.Has not used recently. -  duonebs prn for breathing  Hypothyroidism: Stable: Takes synthroid 250 mcg at home.Last TSH 2.282 on 09/11/2017. -cont home synthroid250 mcg  Hx of Seizures Last seizure in1972. No longer on meds. - no intervention needed  Cirrhosis of the liver without ascites AST 48, ALT 25, TBili 3.2, 2.3 on admission. Denies history of ascites and paracentesis. - avoid hepatotoxic agents   FEN/GI:regular Prophylaxis:no pharmacologic ppx; SCDs  Disposition: pending clinical improvement  Subjective:  Patient states that she has a dry cough, was productive yesterday.  States that she feels the same as she did yesterday.  Notes some "tingling in my ribs" when she coughs.   Objective: Temp:  [98.4 F (36.9 C)-98.5 F (36.9 C)] 98.5 F (36.9 C) (08/26 0513) Pulse Rate:  [74-77] 74 (08/26 0513) Resp:  [18] 18 (08/26 0513) BP: (109-128)/(68-78) 109/68 (08/26 0513) SpO2:  [95 %-97 %] 95 % (08/26 0513)   Physical Exam: General: 52 y.o. female in NAD Cardio: RRR no m/r/g Lungs: CTAB, no wheezing, no rhonchi, no crackles, no increased WOB, dry cough on exam, on RA Abdomen: Soft, non-tender to palpation, positive bowel sounds Skin: warm and dry Extremities: No edema   Laboratory: Recent Labs  Lab 01/02/18 0412 01/03/18 0510 01/03/18 1125  WBC 1.9* 1.6* 1.8*  HGB 11.6* 10.8* 10.8*  HCT 35.0* 32.2* 31.6*  PLT 79* 65* 56*   Recent Labs  Lab 12/28/17 1949  12/30/17 0610 12/31/17 0519 01/01/18 0442 01/03/18 0510  NA 139   < > 137 138 138 136  K 3.8   < > 3.6 3.4* 3.7 3.7  CL 111   < > 110 111 108 108  CO2 20*   < >  _0 BUN 8   < > _1 CREATININE 0.63   < > 0.68 0.57 0.58 0.67  CALCIUM 8.6*   < > 8.7* 8.7* 8.7* 8.7*  PROT 7.3  --  7.0  --   --   --   BILITOT 2.3*  --  3.2*  --   --   --   ALKPHOS 110  --  116  --   --   --   ALT 25  --  25  --   --   --   AST 49*  --  48*  --   --   --   GLUCOSE 109*   < > 152* 137* 124* 134*   < > = values in this  interval not displayed.    Imaging/Diagnostic Tests: No results found.  Camp Swift, DO 01/03/2018, 3:03 PM PGY-1, [place] Intern pager: [phone], text pages welcome

## 2018-01-02 NOTE — Plan of Care (Signed)
  Problem: Clinical Measurements: Goal: Respiratory complications will improve 01/02/2018 2009 by Irish Lack, RN Outcome: Progressing 01/02/2018 0649 by Irish Lack, RN Outcome: Progressing

## 2018-01-03 ENCOUNTER — Other Ambulatory Visit: Payer: Self-pay | Admitting: Oncology

## 2018-01-03 DIAGNOSIS — J189 Pneumonia, unspecified organism: Secondary | ICD-10-CM

## 2018-01-03 DIAGNOSIS — Z09 Encounter for follow-up examination after completed treatment for conditions other than malignant neoplasm: Secondary | ICD-10-CM

## 2018-01-03 LAB — CBC WITH DIFFERENTIAL/PLATELET
Basophils Absolute: 0.1 10*3/uL (ref 0.0–0.1)
Basophils Relative: 3 %
EOS PCT: 6 %
Eosinophils Absolute: 0.1 10*3/uL (ref 0.0–0.7)
HCT: 31.6 % — ABNORMAL LOW (ref 36.0–46.0)
HEMOGLOBIN: 10.8 g/dL — AB (ref 12.0–15.0)
Lymphocytes Relative: 30 %
Lymphs Abs: 0.5 10*3/uL — ABNORMAL LOW (ref 0.7–4.0)
MCH: 29.9 pg (ref 26.0–34.0)
MCHC: 34.2 g/dL (ref 30.0–36.0)
MCV: 87.5 fL (ref 78.0–100.0)
MONO ABS: 0.3 10*3/uL (ref 0.1–1.0)
Monocytes Relative: 15 %
NEUTROS ABS: 0.8 10*3/uL — AB (ref 1.7–7.7)
NEUTROS PCT: 46 %
PLATELETS: 56 10*3/uL — AB (ref 150–400)
RBC: 3.61 MIL/uL — ABNORMAL LOW (ref 3.87–5.11)
RDW: 14.2 % (ref 11.5–15.5)
WBC: 1.8 10*3/uL — ABNORMAL LOW (ref 4.0–10.5)

## 2018-01-03 LAB — BASIC METABOLIC PANEL
Anion gap: 4 — ABNORMAL LOW (ref 5–15)
BUN: 8 mg/dL (ref 6–20)
CHLORIDE: 108 mmol/L (ref 98–111)
CO2: 24 mmol/L (ref 22–32)
Calcium: 8.7 mg/dL — ABNORMAL LOW (ref 8.9–10.3)
Creatinine, Ser: 0.67 mg/dL (ref 0.44–1.00)
GFR calc Af Amer: >60 mL/min (ref >60)
GFR calc non Af Amer: >60 mL/min (ref >60)
GLUCOSE: 134 mg/dL — AB (ref 70–99)
Potassium: 3.7 mmol/L (ref 3.5–5.1)
Sodium: 136 mmol/L (ref 135–145)

## 2018-01-03 LAB — CBC
HEMATOCRIT: 32.2 % — AB (ref 36.0–46.0)
HEMOGLOBIN: 10.8 g/dL — AB (ref 12.0–15.0)
MCH: 29.3 pg (ref 26.0–34.0)
MCHC: 33.5 g/dL (ref 30.0–36.0)
MCV: 87.5 fL (ref 78.0–100.0)
Platelets: 65 10*3/uL — ABNORMAL LOW (ref 150–400)
RBC: 3.68 MIL/uL — ABNORMAL LOW (ref 3.87–5.11)
RDW: 14.2 % (ref 11.5–15.5)
WBC: 1.6 10*3/uL — ABNORMAL LOW (ref 4.0–10.5)

## 2018-01-03 MED ORDER — LORATADINE 10 MG PO TABS
10.0000 mg | ORAL_TABLET | Freq: Every day | ORAL | Status: DC
  Administered 2018-01-03 – 2018-01-05 (×3): 10 mg via ORAL
  Filled 2018-01-03 (×3): qty 1

## 2018-01-03 MED ORDER — LEVOFLOXACIN 750 MG PO TABS
750.0000 mg | ORAL_TABLET | Freq: Every day | ORAL | Status: DC
  Administered 2018-01-03 – 2018-01-05 (×3): 750 mg via ORAL
  Filled 2018-01-03 (×3): qty 1

## 2018-01-03 MED ORDER — ALBUTEROL SULFATE (2.5 MG/3ML) 0.083% IN NEBU: 2.5000 mg | INHALATION_SOLUTION | RESPIRATORY_TRACT | Status: DC | PRN

## 2018-01-03 MED ORDER — BENZONATATE 100 MG PO CAPS
200.0000 mg | ORAL_CAPSULE | Freq: Two times a day (BID) | ORAL | Status: DC
  Administered 2018-01-03 – 2018-01-05 (×5): 200 mg via ORAL
  Filled 2018-01-03 (×5): qty 2

## 2018-01-03 MED ORDER — ALBUTEROL SULFATE HFA 108 (90 BASE) MCG/ACT IN AERS: 2.0000 | INHALATION_SPRAY | RESPIRATORY_TRACT | Status: DC | PRN

## 2018-01-03 NOTE — Progress Notes (Signed)
Physical Therapy Treatment Patient Details Name: Holly Mueller MRN: [id number] DOB: 1965-11-04 Today's Date: 01/03/2018    History of Present Illness Pt is a 52 y/o female admitted secondary to fever, chills, myalgias . PMH is significant for Leukopenia 2/2 non-alcoholic cirrhosis, T4SF, Hypothyroidism, Asthma, GERD, Thrombocytopenia.    PT Comments    Pt performed increased activity, she is now ambulating halls unassisted but performed shirt trial with PTA to retrial the stairs.  Pt with improved mobility during stair training.  Progressed to standing exercises in room with intermittent unilateral support to challenge balance and strengthen LEs. Plan for retrial of HEP to ensure accuracy next session then likely d/c from PT services.        Follow Up Recommendations  No PT follow up     Equipment Recommendations  None recommended by PT    Recommendations for Other Services       Precautions / Restrictions Precautions Precautions: None Precaution Comments: droplet/neutropenic Restrictions Weight Bearing Restrictions: No    Mobility  Bed Mobility Overal bed mobility: Modified Independent                Transfers Overall transfer level: Modified independent Equipment used: None Transfers: Sit to/from Stand Sit to Stand: Modified independent (Device/Increase time)            Ambulation/Gait Ambulation/Gait assistance: Independent   Assistive device: None Gait Pattern/deviations: WFL(Within Functional Limits) Gait velocity: decreased   General Gait Details: No AD ambulating halls independently, Performed 200 ft trial with patient and no deviations seen,  Cues for pacing.     Stairs Stairs: Yes Stairs assistance: Supervision Stair Management: One rail Right Number of Stairs: 3 General stair comments: Cues for sequencing.  Pt educated to perform step to pattern as she is weaker on R side on single limb stance.     Wheelchair Mobility    Modified  Rankin (Stroke Patients Only)       Balance Overall balance assessment: Needs assistance   Sitting balance-Leahy Scale: Normal       Standing balance-Leahy Scale: Good                              Cognition Arousal/Alertness: Awake/alert Behavior During Therapy: WFL for tasks assessed/performed Overall Cognitive Status: Within Functional Limits for tasks assessed                                        Exercises General Exercises - Lower Extremity Hip ABduction/ADduction: AROM;Both;Standing Hip Flexion/Marching: AROM;Both;10 reps;Standing Heel Raises: AROM;Both;10 reps;Standing Mini-Sqauts: AROM;Both;10 reps;Standing    General Comments        Pertinent Vitals/Pain Pain Assessment: No/denies pain    Home Living                      Prior Function            PT Goals (current goals can now be found in the care plan section) Acute Rehab PT Goals Patient Stated Goal: return home Potential to Achieve Goals: Good Progress towards PT goals: Progressing toward goals    Frequency    Min 3X/week      PT Plan Current plan remains appropriate    Co-evaluation              AM-PAC PT "6 Clicks" Daily Activity  Outcome  Measure  Difficulty turning over in bed (including adjusting bedclothes, sheets and blankets)?: None Difficulty moving from lying on back to sitting on the side of the bed? : None Difficulty sitting down on and standing up from a chair with arms (e.g., wheelchair, bedside commode, etc,.)?: None Help needed moving to and from a bed to chair (including a wheelchair)?: None Help needed walking in hospital room?: None Help needed climbing 3-5 steps with a railing? : A Little 6 Click Score: 23    End of Session Equipment Utilized During Treatment: Gait belt Activity Tolerance: Patient tolerated treatment well Patient left: in chair;with call bell/phone within reach Nurse Communication: Mobility status PT  Visit Diagnosis: Other abnormalities of gait and mobility (R26.89)     Time: 2841-3244 PT Time Calculation (min) (ACUTE ONLY): 24 min  Charges:  $Gait Training: 8-22 mins $Therapeutic Exercise: 8-22 mins                     Governor Rooks, PTA pager [place] 01/03/2018, 4:20 PM

## 2018-01-03 NOTE — Progress Notes (Signed)
Spoke with Dr. Jana Hakim by phone regarding patient's decrease in WBC and ANC today.  Dr. Jana Hakim notes that transitioning patient to PO Levaquin should not worsen her neutropenic fever and would be appropriate.  He notes that a bone marrow biopsy may be indicated for further follow up and she will need outpatient follow up.  Upon further chart review shows bone marrow biopsy in 11/2016 that was negative for myelodysplasia or leukemia.  Her oncologist Dr. Lebron Conners, noted that her leukopenia is likely 2/2 to hepatic dysfunction and she was referred to GI. Per Dr. Rosario Jacks note on 10/13/2017, patient's GI doctor had recommended liver transplant.  Bone marrow biopsy not indicated at this time as it appears patient has had extensive workup for leukopenia and thrombocytopenia previously and a new bone marrow biopsy would not be needed.  Will d/c IV zosyn, start levaquin po x 5 days.  Patient will need close outpatient follow up with her hematologist, GI doctor, and PCP.  [place] Constable, D.O.  PGY-1 Family Medicine  01/03/2018 3:32 PM

## 2018-01-04 ENCOUNTER — Other Ambulatory Visit: Payer: Self-pay | Admitting: Oncology

## 2018-01-04 DIAGNOSIS — Z87891 Personal history of nicotine dependence: Secondary | ICD-10-CM

## 2018-01-04 DIAGNOSIS — D696 Thrombocytopenia, unspecified: Secondary | ICD-10-CM

## 2018-01-04 DIAGNOSIS — D709 Neutropenia, unspecified: Principal | ICD-10-CM

## 2018-01-04 DIAGNOSIS — D72819 Decreased white blood cell count, unspecified: Secondary | ICD-10-CM

## 2018-01-04 DIAGNOSIS — B348 Other viral infections of unspecified site: Secondary | ICD-10-CM

## 2018-01-04 DIAGNOSIS — R5081 Fever presenting with conditions classified elsewhere: Secondary | ICD-10-CM

## 2018-01-04 LAB — BASIC METABOLIC PANEL
Anion gap: 5 (ref 5–15)
BUN: 9 mg/dL (ref 6–20)
CHLORIDE: 107 mmol/L (ref 98–111)
CO2: 26 mmol/L (ref 22–32)
CREATININE: 0.69 mg/dL (ref 0.44–1.00)
Calcium: 9.3 mg/dL (ref 8.9–10.3)
GFR calc non Af Amer: >60 mL/min (ref >60)
Glucose, Bld: 147 mg/dL — ABNORMAL HIGH (ref 70–99)
Potassium: 4 mmol/L (ref 3.5–5.1)
Sodium: 138 mmol/L (ref 135–145)

## 2018-01-04 LAB — DIFFERENTIAL
BASOS PCT: 3 %
Basophils Absolute: 0 10*3/uL (ref 0.0–0.1)
EOS PCT: 7 %
Eosinophils Absolute: 0.1 10*3/uL (ref 0.0–0.7)
LYMPHS ABS: 0.4 10*3/uL — AB (ref 0.7–4.0)
LYMPHS PCT: 34 %
MONOS PCT: 14 %
Monocytes Absolute: 0.2 10*3/uL (ref 0.1–1.0)
NEUTROS ABS: 0.4 10*3/uL — AB (ref 1.7–7.7)
Neutrophils Relative %: 42 %

## 2018-01-04 LAB — CBC
HEMATOCRIT: 32.9 % — AB (ref 36.0–46.0)
HEMOGLOBIN: 10.8 g/dL — AB (ref 12.0–15.0)
MCH: 28.7 pg (ref 26.0–34.0)
MCHC: 32.8 g/dL (ref 30.0–36.0)
MCV: 87.5 fL (ref 78.0–100.0)
Platelets: 56 10*3/uL — ABNORMAL LOW (ref 150–400)
RBC: 3.76 MIL/uL — AB (ref 3.87–5.11)
RDW: 14.2 % (ref 11.5–15.5)
WBC: 1.1 10*3/uL — CL (ref 4.0–10.5)

## 2018-01-04 LAB — SAVE SMEAR

## 2018-01-04 MED ORDER — IBUPROFEN 400 MG PO TABS
400.0000 mg | ORAL_TABLET | Freq: Once | ORAL | Status: AC
  Administered 2018-01-04: 400 mg via ORAL
  Filled 2018-01-04: qty 1

## 2018-01-04 NOTE — Plan of Care (Signed)
  Problem: Clinical Measurements: Goal: Will remain free from infection Outcome: Progressing Goal: Diagnostic test results will improve Outcome: Progressing   Problem: Nutrition: Goal: Adequate nutrition will be maintained Outcome: Progressing

## 2018-01-04 NOTE — Progress Notes (Signed)
CRITICAL VALUE ALERT  Critical Value:  WBC 1.1  Date & Time Notied:  8/27./2019   Provider Notified: 01/04/2018 at 8:40  Orders Received/Actions taken: Family Medicine notified

## 2018-01-04 NOTE — Progress Notes (Signed)
Family Medicine Teaching Service Daily Progress Note Intern Pager: [phone]  Patient name: Holly Mueller Medical record number: [id number] Date of birth: 09/10/1965 Age: 52 y.o. Gender: female  Primary Care Provider: [name], MD Consultants: Oncology phone only  Code Status: Partial  Assessment and Plan: Holly Ciesla Sumneris a 52 y.o.femalepresenting with fever, chills, myalgias. 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    HAP: Improving.  Cough reduced, no further sputum production. Overall feels better this am. Started on Levaquin 8/26 from IV Zosyn. Overnight, pt afebrile and hemodynamically stable.  -Cont Levaquin PO for 4 additional days  -Nasal saline spray  -Tylenol PRN -Tessalon perles for cough -Duonebs as needed   Neutropenic fever:   WBC pending this am  Blood culture no growth at 5 days. Has remained afebrile for duration of stay.  -F/u on CBC this am    Pancytopenia:CBC pending this am. Extensive work-up outpatient has been completed for rheumatology and hematology including bone marrow biopsy negative for myelodysplasia and leukemia, no identified cause for pancytopenia at this time. Thought to likely be 2/2 liver cirrhosis.  -F/u on CBC this am  -Droplet precautions -Recommend f/u outpatient with GI for routine colonoscopy and discussion on liver cirrhosis  -Needs Rheum and Heme f/u outpatient  T2DM: Chronic, well-controlled Home meds include metformin 500 mg BID.A1c on admission 5.1. -cont home metformin  PriorHx ofAsthma Patient states that she was told she does not have asthma and is no longer onsymbicort,talesalbuterol inhaler and nebs prn at home.Has not used recently. - duonebs prn for breathing  Hypothyroidism: Stable: Last TSH 2.282 on 09/11/2017. -cont home synthroid250 mcg  Hx of Seizures Last seizure in1972. No longer on meds. - no  intervention needed  Cirrhosis of the liver without ascites AST 48, ALT 25, TBili3.2,2.3 on admission. Denies history of ascites and paracentesis. - avoid hepatotoxic agents   FEN/GI: Regular diet, IV in place  PPx: SCDs   Disposition: Likely d/c home this am pending CBC, send home with levaquin  Subjective:  No acute events overnight. Overall feeling better, cough reduced without sputum production, with minimal sinus pressure left. Denies CP, SOB, abdominal pain.   Objective: Temp:  [97.6 F (36.4 C)-98.1 F (36.7 C)] 98.1 F (36.7 C) (08/27 0535) Pulse Rate:  [75-83] 75 (08/27 0535) Resp:  [18] 18 (08/27 0535) BP: (105-124)/(62-88) 105/62 (08/27 0535) SpO2:  [94 %-98 %] 94 % (08/27 0535) Physical Exam: General: Alert, NAD HEENT: NCAT, MMM, oropharynx nonerythematous  Cardiac: RRR no m/g/r Lungs: Clear bilaterally with exception of bibasilar crackles, no increased WOB  Abdomen: soft, non-tender, non-distended, normoactive BS Msk: Moves all extremities spontaneously  Ext: Warm, dry, 2+ distal pulses, no edema    Laboratory: Recent Labs  Lab 01/02/18 0412 01/03/18 0510 01/03/18 1125  WBC 1.9* 1.6* 1.8*  HGB 11.6* 10.8* 10.8*  HCT 35.0* 32.2* 31.6*  PLT 79* 65* 56*   Recent Labs  Lab 12/28/17 1949  12/30/17 0610 12/31/17 0519 01/01/18 0442 01/03/18 0510  NA 139   < > 137 138 138 136  K 3.8   < > 3.6 3.4* 3.7 3.7  CL 111   < > 110 111 108 108  CO2 20*   < > _0 BUN 8   < > _1 CREATININE 0.63   < > 0.68 0.57 0.58 0.67  CALCIUM 8.6*   < > 8.7* 8.7* 8.7* 8.7*  PROT 7.3  --  7.0  --   --   --   BILITOT 2.3*  --  3.2*  --   --   --   ALKPHOS 110  --  116  --   --   --   ALT 25  --  25  --   --   --   AST 49*  --  48*  --   --   --   GLUCOSE 109*   < > 152* 137* 124* 134*   < > = values in this interval not displayed.     Imaging/Diagnostic Tests: No results found.  Patriciaann Clan, DO 01/04/2018, 6:52 AM PGY-1, [place] Intern pager: [phone], text pages welcome

## 2018-01-04 NOTE — Progress Notes (Signed)
Physical Therapy Treatment Patient Details Name: Holly Mueller MRN: [id number] DOB: [birth year] Today's Date: 01/04/2018    History of Present Illness Pt is a 52 y/o female admitted secondary to fever, chills, myalgias . PMH is significant for Leukopenia 2/2 non-alcoholic cirrhosis, V6PQ, Hypothyroidism, Asthma, GERD, Thrombocytopenia.    PT Comments    Patient received in bed with RN present, pleasant and willing to participate in PT session today. She is able to perform all functional bed mobility, transfers, and gait with ModI but does continue to benefit from general S and ongoing verbal cues for sequencing with stair navigation. Fatigued during session today but motivated to participate in session this morning. Verbally reviewed HEP with patient able to recall correctly and with no significant concerns regarding her exercise program. She was left up in the chair with all needs met this morning.    Follow Up Recommendations  No PT follow up     Equipment Recommendations  None recommended by PT    Recommendations for Other Services       Precautions / Restrictions Precautions Precautions: Other (comment) Precaution Comments: droplet/neutropenic Restrictions Weight Bearing Restrictions: No    Mobility  Bed Mobility Overal bed mobility: Modified Independent                Transfers Overall transfer level: Modified independent Equipment used: None Transfers: Sit to/from Stand Sit to Stand: Modified independent (Device/Increase time)            Ambulation/Gait Ambulation/Gait assistance: Independent Gait Distance (Feet): 500 Feet Assistive device: None Gait Pattern/deviations: WFL(Within Functional Limits)     General Gait Details: no AD when ambulating, no significant gait deficit but fatigued in general    Stairs Stairs: Yes Stairs assistance: Supervision Stair Management: One rail Right Number of Stairs: 12 General stair comments: cues for  sequencing but able to perform easily with extended time    Wheelchair Mobility    Modified Rankin (Stroke Patients Only)       Balance Overall balance assessment: Needs assistance Sitting-balance support: Feet supported Sitting balance-Leahy Scale: Normal     Standing balance support: During functional activity;No upper extremity supported Standing balance-Leahy Scale: Good                              Cognition Arousal/Alertness: Awake/alert Behavior During Therapy: WFL for tasks assessed/performed Overall Cognitive Status: Within Functional Limits for tasks assessed                                        Exercises      General Comments        Pertinent Vitals/Pain Pain Assessment: No/denies pain    Home Living                      Prior Function            PT Goals (current goals can now be found in the care plan section) Acute Rehab PT Goals Patient Stated Goal: return home PT Goal Formulation: With patient Time For Goal Achievement: 01/12/18 Potential to Achieve Goals: Good Progress towards PT goals: Progressing toward goals    Frequency    Min 3X/week      PT Plan Current plan remains appropriate    Co-evaluation  AM-PAC PT "6 Clicks" Daily Activity  Outcome Measure  Difficulty turning over in bed (including adjusting bedclothes, sheets and blankets)?: None Difficulty moving from lying on back to sitting on the side of the bed? : None Difficulty sitting down on and standing up from a chair with arms (e.g., wheelchair, bedside commode, etc,.)?: None Help needed moving to and from a bed to chair (including a wheelchair)?: None Help needed walking in hospital room?: None Help needed climbing 3-5 steps with a railing? : A Little 6 Click Score: 23    End of Session   Activity Tolerance: Patient tolerated treatment well Patient left: in chair;with call bell/phone within reach   PT  Visit Diagnosis: Other abnormalities of gait and mobility (R26.89)     Time: 4045-9136 PT Time Calculation (min) (ACUTE ONLY): 14 min  Charges:  $Gait Training: 8-22 mins                     Deniece Ree PT, DPT, [place]   Pager [phone]

## 2018-01-04 NOTE — Consult Note (Signed)
[place]  [phone]) [phone] [phone]) [phone]     ID: SHERIAL EBRAHIM DOB: [birth year]  MR#: [id number]  DDU#:[id number]  Patient Care Team: [name], MD as PCP - General (Family Medicine) Sanda Klein, MD as PCP - Cardiology (Cardiology) Chauncey Cruel, MD OTHER MD:  CHIEF COMPLAINT: Neutropenia/leukopenia  CURRENT TREATMENT: Observation   HISTORY OF CURRENT ILLNESS: Mercedez had a normal CBC on May 26, 2012.  At that time the white cell count was 4.5, hemoglobin 14.3, and platelets 230,000.  Some artifactual platelet clumps were noted.  Since then she has had a decline in her white cell count and neutrophil count as well as her platelet count.  This led to consultation with my former partner Dr. Lebron Conners July 2018.  At that time he obtained a bone marrow biopsy, 12/02/2016, and peripheral blood flow cytometry, 11/17/2016.  He also obtain cytogenetics from the bone marrow material.  The bone marrow was hypercellular, not hypocellular, there was no evidence of malignancy or atypia. These results are discussed below.  On 08/53 2019 the patient presented to the emergency room with fever and a cough.  Chest x-ray initially was non-informative but repeat on 01/01/2018 was suggestive of pneumonia.  Work-up was negative except for rhinovirus positivity on the virus panel.  All cultures are negative to date.  The patient has been treated with appropriate antibiotics and is currently on Levaquin.  The white cell count this admission was 1.4, hemoglobin 11.5, platelets 66,000 and ANC 0.7.  This was the reason for the consult today.  Note the patient received 1 dose of Granix last week which brought her ANC up to 1.8.  It gradually declined over the next several days to baseline.   INTERVAL HISTORY: I met with the patient in her hospital room on 08/53/2019 in the evening.  No family was present  REVIEW OF SYSTEMS: The patient tells me she now feels well.   She gets a little bit short of breath at times when walking in the halls.  She denies unusual headaches, visual changes, nausea, vomiting, or falls.  Currently she has a mild cough but it is considerably better than on admission and it is nonproductive.  She denies pleurisy.  She denies chest pain or pressure, change in bowel or bladder habits, rash, joint swelling, significant arthralgias, or unexplained weight loss or unexplained fatigue.  A detailed review of systems was otherwise noncontributory  PAST MEDICAL HISTORY: Past Medical History:  Diagnosis Date  . Arthritis    bursitis left hip flares-not an issue  . Diabetes mellitus without complication ([place])   . Edentulous    10-19-13 at present  . Epilepsy ([place]) in 1972   No seizures since 1972. Previously treated with phenobarbital.   . Fever 06/2017  . GERD (gastroesophageal reflux disease) 2008  . Headache(784.0)    hx of migraines   . Hypothyroidism 2008  . Lower esophageal ring 08/18/2013  . Seasonal allergies 2003  . Shortness of breath     PAST SURGICAL HISTORY: Past Surgical History:  Procedure Laterality Date  . BALLOON DILATION N/A 10/24/2013   Procedure: BALLOON DILATION;  Surgeon: Gatha Mayer, MD;  Location: [facility] END[facility]COPY;  Service: Endoscopy;  Laterality: N/A;  . CESAREAN SECTION     x2  . DENTAL SURGERY     multiple extractions 3'15  . ESOPHAGOGASTRODUODENOSCOPY N/A 10/24/2013   Procedure: ESOPHAGOGASTRODUODENOSCOPY (EGD);  Surgeon: Gatha Mayer, MD;  Location: [facility];  Service: Endoscopy;  Laterality: N/A;  .  LIVER BIOPSY  06/30/2012   Procedure: LIVER BIOPSY;  Surgeon: Shann Medal, MD;  Location: [facility] ORS;  Service: General;;  . NOVASURE ABLATION    . SHOULDER ARTHROSCOPY Right 2011  . UMBILICAL HERNIA REPAIR N/A 06/30/2012   Procedure: remove umbilicus;  Surgeon: Shann Medal, MD;  Location: [facility];  Service: General;  Laterality: N/A;  . VENTRAL HERNIA REPAIR N/A 06/30/2012   Procedure: LAPAROSCOPIC  VENTRAL HERNIA;  Surgeon: Shann Medal, MD;  Location: [facility];  Service: General;  Laterality: N/A;  With Mesh  . WISDOM TOOTH EXTRACTION      FAMILY HISTORY Family History  Problem Relation Age of Onset  . Hypertension Mother   . Diabetes Mother   . Lung cancer Father   . Stroke Father   . Diabetes Sister   . Hypertension Sister   The patient's father had a history of lung cancer, died from a stroke at the age of 53.  The patient's mother died at the age of 53 from cirrhosis.  The patient tells me her mother was not an alcohol abuser.  The patient has 3 brothers 2 sisters.  One sister died from an apparent subarachnoid hemorrhage.  The patient is not aware of a history of lupus or rheumatoid arthritis in the family  GYNECOLOGIC HISTORY:  No LMP recorded. Patient has had an ablation. Menarche: 52 years old Age at first live birth: 52 years old [place] P 2 LMP status post hysterectomy remotely, without salpingo-oophorectomy  SOCIAL HISTORY:  Kariah describes herself as single.  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  HER-2 sons live with her.  Her son Jordan Hawks is currently 54.  He recently lost his job.  Son Carmelia Bake is 40.  The patient tells me he has "mental problems", possibly schizophrenia.  The patient has no grandchildren.  She is not a church or tender    ADVANCED DIRECTIVES: Not in place.  The patient tells me if she has the opportunity she would like to name her brother Oza Oberle as healthcare power of attorney.  He can be reached at [place]: Social History   Tobacco Use  . Smoking status: Former Smoker    Packs/day: 0.25    Types: Cigarettes    Last attempt to quit: 1997    Years since quitting: 22.6  . Smokeless tobacco: Never Used  Substance Use Topics  . Alcohol use: Yes    Comment: occ  . Drug use: No     Colonoscopy: Never  PAP: 2011  Bone density: Never  Mammogram: " It has been years"   Allergies  Allergen  Reactions  . Aspirin Nausea Only    Upset stomach    Current Facility-Administered Medications  Medication Dose Route Frequency Provider Last Rate Last Dose  . albuterol (PROVENTIL) (2.5 MG/3ML) 0.083% nebulizer solution 2.5 mg  2.5 mg Nebulization Q4H PRN Rory Percy, DO      . benzonatate (TESSALON) capsule 200 mg  200 mg Oral BID Rory Percy, DO   200 mg at 01/04/18 7893  . levofloxacin (LEVAQUIN) tablet 750 mg  750 mg Oral Daily Meccariello, Bernita Raisin, DO   750 mg at 01/04/18 8101  . levothyroxine (SYNTHROID, LEVOTHROID) tablet 250 mcg  250 mcg Oral QAC breakfast Everrett Coombe, MD   250 mcg at 01/04/18 [phone]  . loratadine (CLARITIN) tablet 10 mg  10 mg Oral Daily Alveda Reasons, MD   10 mg at  01/04/18 2355  . metFORMIN (GLUCOPHAGE) tablet 500 mg  500 mg Oral BID WC Everrett Coombe, MD   500 mg at 01/04/18 0835  . pantoprazole (PROTONIX) EC tablet 40 mg  40 mg Oral Daily Everrett Coombe, MD   40 mg at 01/04/18 7322  . promethazine (PHENERGAN) tablet 6.25 mg  6.25 mg Oral Q6H PRN Karren Cobble, RPH      . sodium chloride (OCEAN) 0.65 % nasal spray 1 spray  1 spray Each Nare PRN Lovenia Kim, MD      . ursodiol (ACTIGALL) capsule 600 mg  600 mg Oral BID Everrett Coombe, MD   600 mg at 01/04/18 0254    OBJECTIVE: Middle-aged white woman examined sitting in bed  Vitals:   01/04/18 0535 01/04/18 1423  BP: 105/62 97/66  Pulse: 75 76  Resp: 18 18  Temp: 98.1 F (36.7 C) 98.2 F (36.8 C)  SpO2: 94% 96%     Body mass index is 39.75 kg/m.   Wt Readings from Last 3 Encounters:  12/29/17 246 lb 4.1 oz (111.7 kg)  12/28/17 245 lb 6.4 oz (111.3 kg)  10/14/17 235 lb (106.6 kg)      ECOG FS:1 - Symptomatic but completely ambulatory  Ocular: Sclerae unicteric, pupils round and equal Lymphatic: No cervical or supraclavicular adenopathy Lungs no rales or rhonchi, no wheezes Heart regular rate and rhythm Abd soft, nontender, positive bowel sounds, did not palpate a definite spleen  tip MSK no focal spinal tenderness, no joint edema Neuro: non-focal, well-oriented, appropriate affect Breasts: Deferred   LAB RESULTS:  CMP     Component Value Date/Time   NA 138 01/04/2018 0641   NA 140 10/05/2017 1422   NA 137 11/17/2016 1208   K 4.0 01/04/2018 0641   K 4.2 11/17/2016 1208   CL 107 01/04/2018 0641   CO2 26 01/04/2018 0641   CO2 24 11/17/2016 1208   GLUCOSE 147 (H) 01/04/2018 0641   GLUCOSE 216 (H) 11/17/2016 1208   BUN 9 01/04/2018 0641   BUN 13 10/05/2017 1422   BUN 7.2 11/17/2016 1208   CREATININE 0.69 01/04/2018 0641   CREATININE 0.8 11/17/2016 1208   CALCIUM 9.3 01/04/2018 0641   CALCIUM 9.3 11/17/2016 1208   PROT 7.0 12/30/2017 0610   PROT 8.3 10/05/2017 1422   PROT 8.4 (H) 11/17/2016 1208   ALBUMIN 2.8 (L) 12/30/2017 0610   ALBUMIN 3.8 10/05/2017 1422   ALBUMIN 3.3 (L) 11/17/2016 1208   AST 48 (H) 12/30/2017 0610   AST 61 (H) 11/17/2016 1208   ALT 25 12/30/2017 0610   ALT 46 11/17/2016 1208   ALKPHOS 116 12/30/2017 0610   ALKPHOS 148 11/17/2016 1208   BILITOT 3.2 (H) 12/30/2017 0610   BILITOT 2.4 (H) 10/05/2017 1422   BILITOT 2.38 (H) 11/17/2016 1208   GFRNONAA >60 01/04/2018 0641   GFRAA >60 01/04/2018 0641    Lab Results  Component Value Date   MSPIKE Not Observed 11/17/2016    No results found for: KPAFRELGTCHN, LAMBDASER, [facility]  Lab Results  Component Value Date   WBC 1.1 (LL) 01/04/2018   NEUTROABS 0.4 (L) 01/04/2018   HGB 10.8 (L) 01/04/2018   HCT 32.9 (L) 01/04/2018   MCV 87.5 01/04/2018   PLT 56 (L) 01/04/2018    _0 @  No results found for: LABCA2  No components found for: [id number]  No results for input(s): INR in the last 168 hours.  No results found for: LABCA2  No results found for: GBT517  No results found for: IRC789  No results found for: FYB017  No results found for: CA2729  No components found for: HGQUANT  No results found for: CEA1 / No results found for: CEA1   Lab  Results  Component Value Date   AFPTUMOR 5.8 06/18/2017    No results found for: CHROMOGRNA  No results found for: PSA1  No results displayed because visit has over 200 results.      (this displays the last labs from the last 3 days)  Lab Results  Component Value Date   MSPIKE Not Observed 11/17/2016   (this displays SPEP labs)  No results found for: KPAFRELGTCHN, LAMBDASER, KAPLAMBRATIO (kappa/lambda light chains)  No results found for: HGBA, HGBA2QUANT, HGBFQUANT, HGBSQUAN (Hemoglobinopathy evaluation)   Lab Results  Component Value Date   LDH 258 (H) 11/17/2016    Lab Results  Component Value Date   IRON 193 (H) 12/28/2017   TIBC 347 12/28/2017   IRONPCTSAT 56 (H) 12/28/2017   (Iron and TIBC)  Lab Results  Component Value Date   FERRITIN 45 12/28/2017    Urinalysis    Component Value Date/Time   COLORURINE YELLOW 12/28/2017 1950   APPEARANCEUR CLEAR 12/28/2017 1950   LABSPEC 1.012 12/28/2017 1950   PHURINE 7.0 12/28/2017 1950   GLUCOSEU NEGATIVE 12/28/2017 1950   HGBUR SMALL (A) 12/28/2017 1950   BILIRUBINUR NEGATIVE 12/28/2017 1950   KETONESUR NEGATIVE 12/28/2017 1950   PROTEINUR NEGATIVE 12/28/2017 1950   UROBILINOGEN 2.0 (H) 11/22/2013 1743   NITRITE NEGATIVE 12/28/2017 1950   LEUKOCYTESUR NEGATIVE 12/28/2017 1950     STUDIES: Dg Chest 2 View  Result Date: 01/01/2018 CLINICAL DATA:  Productive cough.  Shortness of breath. EXAM: CHEST - 2 VIEW COMPARISON:  Two-view chest x-ray 12/28/2017 FINDINGS: The heart size is normal. Lung volumes are low. Progressive lingular airspace disease worrisome for pneumonia. Mild bibasilar airspace disease otherwise likely represents atelectasis. There is mild pulmonary vascular congestion without frank edema. No significant effusions are present. The visualized soft tissues and bony thorax are unremarkable. IMPRESSION: 1. Progressive lingular airspace disease consistent with pneumonia. 2. Low lung volumes and mild  pulmonary vascular congestion without frank edema. 3. Mild bibasilar airspace disease likely reflects atelectasis. Electronically Signed   By: San Morelle M.D.   On: 01/01/2018 14:16   Dg Chest 2 View  Result Date: 12/28/2017 CLINICAL DATA:  Short of breath EXAM: CHEST - 2 VIEW COMPARISON:  08/04/2016, CT 09/11/2017, radiograph 04/05/2017, 11/16/2015 FINDINGS: Chronic interstitial opacity at the left greater than right lung bases. No focal consolidation or pleural effusion. Stable cardiomediastinal silhouette. Low lung volume. No pneumothorax. IMPRESSION: Low lung volumes with chronic interstitial opacity in the left greater than right lung base. No acute interval change as compared with prior studies. Electronically Signed   By: Donavan Foil M.D.   On: 12/28/2017 21:22    ELIGIBLE FOR AVAILABLE RESEARCH PROTOCOL: no  ASSESSMENT: 52 y.o. [place] woman presenting with cough and fever 12/28/2017, with negative work-ups other than a positive rhinovirus on panel, but with long-standing leukopenia.  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  This is discussed below.  It requires no intervention as far as the leukopenia is concerned.  She does need rheumatologic follow-up  (  3) the patient's thrombocytopenia is likely secondary to splenomegaly/splenic sequestration, itself secondary to the patient's known cirrhosis.    (4) health maintenance: The patient tells me she has not had a mammogram in many years.  Last Pap smear I find is from August 2011. I do not find evidence of a colonoscopy or Cologuard.  (5) the patient does not have a healthcare power of attorney.  She is ready to name her brother Ceara Wrightson  as healthcare power of attorney if someone can provide her with the appropriate documents and help her get them notarized.  PLAN: I spent approximately 50 minutes face to face with Primrose with more than 50% of that time spent in counseling and coordination of care. Specifically we reviewed the biology of the patient's diagnosis and the specifics of her situation.  She understands that there are 3 types of blood cells, and that they are made in the marrow of bones.  She has had minimal problems as far as her red cells are concerned.    She does have thrombocytopenia, which is largely attributable to splenomegaly and splenic sequestration.  There may also be an element of chronic ITP.  In general so long as the platelet count is around 50,000 or above no intervention is needed.  The low white cell count is more problematic.  It is fairly constant and therefore unlikely to be due to alcohol binging, which was a hypothesis raised in the prior hematologic consult.  In any case the patient denies alcohol abuse.  The bone marrow biopsy obtained last year was convincing. It was hypercellular, with normal cytogenetics.  There was no evidence of dysplasia, aplasia, lymphoma, or leukemia.  There were also no detectable B-cell clones or T-cell abnormalities in the peripheral blood.  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  We do not have a specific antibody to demonstrate this.  However it is consistent with the patient's known positive ANA, positive SSA ([place]), and the possible autoimmune etiology of her cirrhosis given her positive mitochondrial M2 titer.   Kenzlee's ANC will respond to stimulation--a single granix dose brought the ANC up to 1.8 last week. If Sheilyn developed a bacterial infection she should be able to mount a good response. There is no role for continued stimulation of the WBC. We did review the importance of handwashing and other precautions.  On the other hand if the patient does have a  definable rheumatologic syndrome, and if it is treated with some form of immunosuppression (rituximab?) I would expect her WBC to normalize.  I am leaving updating the patient's health maintenance and rheumatologic follow-up to your discretion. I will see Ugochi as outpatient in a few weeks. Please let me know if I can be of further help.  Chauncey Cruel, MD   01/04/2018 6:03 PM Medical Oncology and Hematology [facility] [address] [place], [place] [address] Tel. [phone]    Fax. [phone]

## 2018-01-04 NOTE — Plan of Care (Signed)
  Problem: Clinical Measurements: Goal: Ability to maintain clinical measurements within normal limits will improve Outcome: Progressing Goal: Will remain free from infection Outcome: Progressing   Problem: Activity: Goal: Risk for activity intolerance will decrease Outcome: Progressing   Problem: Nutrition: Goal: Adequate nutrition will be maintained Outcome: Progressing   Problem: Pain Managment: Goal: General experience of comfort will improve Outcome: Progressing

## 2018-01-05 ENCOUNTER — Encounter (HOSPITAL_COMMUNITY): Payer: Self-pay

## 2018-01-05 LAB — CBC WITH DIFFERENTIAL/PLATELET
BASOS ABS: 0 10*3/uL (ref 0.0–0.1)
BASOS PCT: 2 %
EOS PCT: 7 %
Eosinophils Absolute: 0.1 10*3/uL (ref 0.0–0.7)
HEMATOCRIT: 31.7 % — AB (ref 36.0–46.0)
Hemoglobin: 10.6 g/dL — ABNORMAL LOW (ref 12.0–15.0)
LYMPHS ABS: 0.4 10*3/uL — AB (ref 0.7–4.0)
Lymphocytes Relative: 36 %
MCH: 29 pg (ref 26.0–34.0)
MCHC: 33.4 g/dL (ref 30.0–36.0)
MCV: 86.8 fL (ref 78.0–100.0)
MONOS PCT: 16 %
Monocytes Absolute: 0.2 10*3/uL (ref 0.1–1.0)
NEUTROS ABS: 0.5 10*3/uL — AB (ref 1.7–7.7)
Neutrophils Relative %: 39 %
Platelets: 55 10*3/uL — ABNORMAL LOW (ref 150–400)
RBC: 3.65 MIL/uL — ABNORMAL LOW (ref 3.87–5.11)
RDW: 13.9 % (ref 11.5–15.5)
WBC: 1.2 10*3/uL — CL (ref 4.0–10.5)

## 2018-01-05 LAB — BASIC METABOLIC PANEL
Anion gap: 4 — ABNORMAL LOW (ref 5–15)
BUN: 11 mg/dL (ref 6–20)
CALCIUM: 8.8 mg/dL — AB (ref 8.9–10.3)
CO2: 24 mmol/L (ref 22–32)
CREATININE: 0.64 mg/dL (ref 0.44–1.00)
Chloride: 110 mmol/L (ref 98–111)
GFR calc Af Amer: >60 mL/min (ref >60)
GFR calc non Af Amer: >60 mL/min (ref >60)
GLUCOSE: 120 mg/dL — AB (ref 70–99)
Potassium: 3.9 mmol/L (ref 3.5–5.1)
Sodium: 138 mmol/L (ref 135–145)

## 2018-01-05 MED ORDER — LEVOFLOXACIN 750 MG PO TABS: 750.0000 mg | ORAL_TABLET | Freq: Every day | ORAL | 0 refills | Status: DC

## 2018-01-05 MED ORDER — LEVOTHYROXINE SODIUM 125 MCG PO TABS: 250.0000 ug | ORAL_TABLET | Freq: Every day | ORAL | 0 refills | Status: DC

## 2018-01-05 MED FILL — LEVOTHYROXINE 125 MCG TAB: 125 | 30 days supply | Qty: 60 | Fill #0

## 2018-01-05 NOTE — Discharge Summary (Signed)
[facility] Discharge Summary  Patient name: Holly Mueller Medical record number: [id number] Date of birth: [birth year] Age: 51 y.o. Gender: female Date of Admission: 12/28/2017  Date of Discharge: 01/05/2018 Admitting Physician: Holly Feil, MD  Primary Care Provider: [name], MD Consultants: Oncology  Indication for Hospitalization: Neutropenic Fever   Discharge Diagnoses/Problem List:  Patient Active Problem List   Diagnosis Date Noted  . Follow-up exam   . HAP (hospital-acquired pneumonia)   . Febrile illness   . Restless leg 12/28/2017  . Fever of unknown origin 12/28/2017  . Food insecurity 10/15/2017  . Weakness 10/06/2017  . Hyperglycemia 10/06/2017  . Chest pain 09/11/2017  . Ectatic aorta ([place]) 09/11/2017  . Chest pressure 09/11/2017  . Cellulitis of left lower extremity   . Pulmonary edema 06/30/2017  . Left leg pain   . Type 2 diabetes mellitus without complication, without long-term current use of insulin ([place])   . Bibasilar crackles   . Influenza-like illness   . Neutropenic fever ([place]) 06/14/2017  . Cirrhosis of liver without ascites ([place]) 11/17/2016  . Thrombocytopenia ([place]) 11/17/2016  . Leucopenia 11/17/2016  . Bicytopenia 10/24/2016  . Epistaxis 10/23/2016  . GERD (gastroesophageal reflux disease) 10/23/2016  . Cough 12/09/2015  . Chronic pain of left thumb 10/09/2015  . Precordial chest pain 08/03/2015  . Abscess of skin of abdomen 07/10/2014  . Bursitis of left hip 09/14/2013  . Lower esophageal ring 08/18/2013  . DOE (dyspnea on exertion) 08/17/2013  . Ganglion cyst of wrist 08/24/2012  . Right hip pain 08/23/2012  . Umbilical hernia 85/06/7739  . Healthcare maintenance 02/14/2012  . Osteoarthritis 02/12/2012  . Elevated LFTs 10/30/2011  . Allergic rhinitis 08/26/2011  . Obesity 07/08/2010  . SEIZURES, HX OF 12/12/2009  . DENTAL CARIES 12/18/2008  . DEPRESSION 07/26/2008  . MIGRAINE HEADACHE  11/15/2007  . Hypothyroidism 10/12/2006  . Restrictive lung disease 07/30/2006   Disposition: Home  Discharge Condition: Improved  Discharge Exam:  General: 52 y.o. female in NAD sitting up in bed with intermittent coughing Cardio: RRR no m/r/g, bilateral radial pulses palpated  HENT: normocephalic without trauma, pupils equal and reactive to light, oropharynx without erythema, exudate nor edematous tonsils, patent nostrils without edematous turbinates, neck supple, without lymphadenopathy, no thyromegaly  Lungs: CTAB, no wheezing, no rhonchi. Bibasilar crackles. Appropriate saturations on room air. Abdomen: Soft, non-tender to palpation, positive bowel sounds Skin: warm and dry Extremities: No edema Psych: tearful affect congruent with "worried" mood   Brief Hospital Course:  [name] is a 52 year old female, with a past medical history significant for chronic leukopenia and thrombocytopenia thought to be 2/2 non-alcoholic liver cirrhosis possible autoimmune in origin, that presented following a fever of 102F at home with associated myalgias and productive cough. On arrival, she was afebrile and hemodynamically stable. CBC notable for WBC of 1.0 and ANC of 400. CXR without consolidations. U/A clear. She was started on IV vancomycin, flagyl, and cefepime for neutropenic fever that was deescalated to IV cefepime. Antibiotics were discontinued after 48 hours when blood cultures remained negative. On hospital day 1, she received a dose of Granix, per recommendation of Oncology, with notable improvement of her ANC to 1800 the following day. She appeared to be clinically improving with reduced symptomatology, however developed increased productive cough and dyspnea on 8/24. CXR obtained, showing progressive lingular airspace disease consistent with pneumonia. She was initiated IV zosyn for presumed HAP, and transitioned to oral levaquin on 8/26 for  five additional days with Oncology in consult.  During her stay, she remained afebrile and hemodynamically stable. Her [place] unfortunately trended down slowly, however as expected per Oncology, with [place] at discharge of 0.5.  She will follow up with Oncology outpatient who also recommended establishment with Rheumatology as well.  Issues for Follow Up:  1. Ensure follow up with rheumatology and Heme/Onc for pancytopenia.  2. Recommend follow up with GI for routine colonoscopy and further evaluation of liver cirrhosis.  3. She is due for a pap smear, please ensure she schedules this. 4. Will continue PO Levaquin for 5 day PO course, last day 8/30.  Significant Procedures: None  Significant Labs and Imaging:  Recent Labs  Lab 01/03/18 1125 01/04/18 0641 01/05/18 0436  WBC 1.8* 1.1* 1.2*  HGB 10.8* 10.8* 10.6*  HCT 31.6* 32.9* 31.7*  PLT 56* 56* 55*   Recent Labs  Lab 12/30/17 0610 12/31/17 0519 01/01/18 0442 01/03/18 0510 01/04/18 0641 01/05/18 0436  NA 137 138 138 136 138 138  K 3.6 3.4* 3.7 3.7 4.0 3.9  CL 110 111 108 108 107 110  CO2 _0 GLUCOSE 152* 137* 124* 134* 147* 120*  BUN _1 CREATININE 0.68 0.57 0.58 0.67 0.69 0.64  CALCIUM 8.7* 8.7* 8.7* 8.7* 9.3 8.8*  ALKPHOS 116  --   --   --   --   --   AST 48*  --   --   --   --   --   ALT 25  --   --   --   --   --   ALBUMIN 2.8*  --   --   --   --   --    CXR 8/24 FINDINGS: The heart size is normal. Lung volumes are low. Progressive lingular airspace disease worrisome for pneumonia. Mild bibasilar airspace disease otherwise likely represents atelectasis. There is mild pulmonary vascular congestion without frank edema. No significant effusions are present. The visualized soft tissues and bony thorax are unremarkable. IMPRESSION: 1. Progressive lingular airspace disease consistent with pneumonia. 2. Low lung volumes and mild pulmonary vascular congestion without frank edema. 3. Mild bibasilar airspace disease likely reflects  atelectasis.  CXR 8/20 FINDINGS: Chronic interstitial opacity at the left greater than right lung bases. No focal consolidation or pleural effusion. Stable cardiomediastinal silhouette. Low lung volume. No pneumothorax.  IMPRESSION: Low lung volumes with chronic interstitial opacity in the left greater than right lung base. No acute interval change as compared with prior studies.  Results/Tests Pending at Time of Discharge: None  Discharge Medications:  Allergies as of 01/05/2018      Reactions   Aspirin Nausea Only   Upset stomach      Medication List    STOP taking these medications   budesonide-formoterol 160-4.5 MCG/ACT inhaler Commonly known as:  SYMBICORT   capsaicin 0.025 % cream Commonly known as:  ZOSTRIX     TAKE these medications   ACCU-CHEK AVIVA PLUS test strip Generic drug:  glucose blood USE UP TO 4 TIMES DAILY AS DIRECTED   ACCU-CHEK SOFTCLIX LANCETS lancets USE UP TO 4 TIMES DAILY AS DIRECTED   albuterol (2.5 MG/3ML) 0.083% nebulizer solution Commonly known as:  PROVENTIL Take 3 mLs (2.5 mg total) by nebulization every 6 (six) hours as needed for wheezing or shortness of breath.   albuterol 108 (90 Base) MCG/ACT inhaler Commonly known as:  PROVENTIL HFA;VENTOLIN HFA INHALE 2 PUFFS INTO  THE LUNGS EVERY 6 HOURS AS NEEDED FOR SHORTNESS OF BREATH   blood glucose meter kit and supplies Kit Dispense based on patient and insurance preference. Use up to four times daily as directed. (FOR ICD-9 250.00, 250.01).   cetirizine 10 MG tablet Commonly known as:  ZYRTEC Take 1 tablet (10 mg total) by mouth daily.   fluticasone 50 MCG/ACT nasal spray Commonly known as:  FLONASE Place 2 sprays into both nostrils daily.   ibuprofen 200 MG tablet Commonly known as:  ADVIL,MOTRIN Take 600 mg by mouth every 6 (six) hours as needed for moderate pain.   levofloxacin 750 MG tablet Commonly known as:  LEVAQUIN Take 1 tablet (750 mg total) by mouth daily. Start taking  on:  01/06/2018   levothyroxine 125 MCG tablet Commonly known as:  SYNTHROID, LEVOTHROID Take 2 tablets (250 mcg total) by mouth daily. What changed:    how much to take  when to take this   metFORMIN 500 MG tablet Commonly known as:  GLUCOPHAGE Take 1 tablet (500 mg total) by mouth 2 (two) times daily with a meal.   pantoprazole 40 MG tablet Commonly known as:  PROTONIX TAKE 1 TABLET BY MOUTH DAILY.   promethazine-dextromethorphan 6.25-15 MG/5ML syrup Commonly known as:  PROMETHAZINE-DM Take 5 mLs by mouth 4 (four) times daily as needed for cough.   ursodiol 500 MG tablet Commonly known as:  ACTIGALL Take 500 mg by mouth 2 (two) times daily.       Discharge Instructions: Please refer to Patient Instructions section of EMR for full details.  Patient was counseled important signs and symptoms that should prompt return to medical care, changes in medications, dietary instructions, activity restrictions, and follow up appointments.   Follow-Up Appointments: Follow-up [place] Follow up on 01/12/2018.   Why:  @ 9:50am. Please arrive 15 minutes prior to appointment time. Contact information: [place]          Rory Percy, DO 01/05/2018, 3:08 PM [place]

## 2018-01-05 NOTE — Progress Notes (Signed)
Discharge instructions to Pt, Pt discharge home.

## 2018-01-05 NOTE — Discharge Instructions (Signed)
You were admitted for a fever in the setting of having a low white blood cell count. This is likely due to an autoimmune cause. You were treated with antibiotics for a possible pneumonia which you will continue on discharge for 2 additional days. You should follow up with your hematologist/oncologist as well as a rheumatologist for further work up of your low white blood cell count. Please see your primary doctor on 9/4. If you develop fever, you should call your primary doctor. If you develop difficulty breathing, you should go to the emergency department.  Neutropenic Fever Neutropenic fever is a type of fever that can develop in someone who has a very low number of a certain kind of white blood cells called neutrophils (neutropenia). These blood cells are important for fighting infections caused by bacteria and fungi. When you have neutropenia, you could be in danger of a severe infection. You may need to start taking antibiotic medicines. What are the causes? Neutrophils are made in the spongy tissue inside your bones (bone marrow). Anything that damages your bone marrow or damages neutrophils after they leave your bone marrow can cause neutropenia. Once you have a dangerously low level of neutrophils, you are at risk for infection and neutropenic fever. Causes of neutropenia may include:  Cancer treatments.  Bone marrow cancer.  Cancer of the white blood cells (leukemia or myeloma).  Severe infection.  Bone marrow failure (aplastic anemia).  Many types of medicines.  Diseases of the bodys defense system (autoimmune diseases).  Inherited genes that cause neutropenia.  Vitamin B deficiency.  Spleen enlargement in rheumatoid arthritis (Felty syndrome).  What are the signs or symptoms? Fever is the main symptom of neutropenic fever. Other signs and symptoms may include:  Chills.  Fatigue.  Painful mouth ulcers.  Cough.  Shortness of breath.  Swollen glands (lymph  nodes).  Sore throat.  Sinus and ear infections.  Gum disease.  Skin infection.  Burning and frequent urination.  Rectal infections.  Vaginal discharge or itching.  How is this diagnosed? Your health care provider may diagnose neutropenic fever if your neutrophil count is less than 500 neutrophils per microliter of blood and you have a fever of at least 100.30F (38.0C).  Blood tests and other tests that measure neutrophils will be done. These may include: ? A complete blood count (CBC) and a differential white blood count (WBC). ? Peripheral smear. This test involves checking a blood sample under a microscope.  Other types of tests may also be done, including: ? Chest X-rays. ? Cultures of blood and body fluids to look for a source of infection.  Your health care provider will also determine if your neutropenic fever is high risk or low risk.  You may have high-risk neutropenic fever if: ? Your neutrophil count is less than 100 neutrophils per microliter of blood. ? You have also been diagnosed with pneumonia or another serious medical problem. ? Your condition requires you to be treated in the hospital.  You may have low-risk neutropenic fever if: ? Your neutrophil count is more than 100 neutrophils per microliter of blood. ? Your chest X-ray is normal. ? You do not have an active illness or any other problems that require you to be in the hospital.  How is this treated? You may start treatment as soon as you get diagnosed with neutropenic fever, even if your health care provider is still looking for the source of infection.  Treatment for high-risk neutropenic fever is antibiotic medicine  given through an IV access tube. This is done in the hospital. You may be given a single antibiotic or a combination of antibiotics.  Low-risk neutropenic fever may be treated at home. You may have to take one or two different oral antibiotics. In some cases, you may need to be treated  with IV antibiotics that are given by a home health care provider who visits your home.  If your health care provider finds a specific cause of infection, you may be switched to the antibiotics that work best against those particular bacteria.  If a fungal infection is found, your medicine will be changed to an antifungal medicine.  If the fever goes away in 3-5 days, you may have to take medicine for about 7 days. If the fever is not responding, you may have to take medicine longer.  You may have to stop taking any medicine that could be causing neutropenic fever.  If you have neutropenic fever from cancer treatment drugs (chemotherapy), you may need to take a type of medicine called white blood cell growth factors. This medicine can help prevent fever.  Follow these instructions at home:  Only take medicines as directed by your health care provider. ? If you are being treated with oral antibiotics at home, you may need to return to your health care provider every day to have your CBC checked. You may have to do this until your fever responds. ? Take your antibiotics as directed. Make sure you finish them even if you start to feel better.  Preventing infection is important when you have neutropenia. Here are some ways to prevent infections: ? Avoid sick friends and family members. ? Wash your hands often. ? Do noteat uncooked or undercooked meats. ? Wash all fruits and vegetables. ? Do not eat or drink unpasteurized dairy products. ? Get regular dental care, and maintain good dental hygiene. ? Get a flu shot. Ask your health care provider whether you need any other vaccines. ? Wear gloves when gardening.  Follow up with your health care provider as directed. Contact a health care provider if:  You have chills.  You have a fever.  You have signs or symptoms of infection. Get help right away if:  You have trouble breathing.  You have chest pain. This information is not  intended to replace advice given to you by your health care provider. Make sure you discuss any questions you have with your health care provider. Document Released: 05/02/2013 Document Revised: 10/03/2015 Document Reviewed: 10/30/2015 Elsevier Interactive Patient Education  [employer].

## 2018-01-05 NOTE — Progress Notes (Addendum)
Family Medicine Teaching Service Daily Progress Note Acting Intern Pager: [phone]  Patient name: Holly Mueller Medical record number: [id number] Date of birth: Feb 20, 1966 Age: 52 y.o. Gender: female  Primary Care Provider: [name], MD Consultants: Oncology -phone only Code Status: partial   Pt Overview and Major Events to Date:  Admitted 8/20 for neutropenic fever   Assessment and Plan: Holly Mueller a 53 y.o.femalepresenting with fever, chills, myalgia with pmhx significant for leukopenia 2/2 non-alcoholic cirrhosis, N8TR, hypothyroidism, asthma, GERD, thrombocytopenia. Patient continues to remain afebrile with stable AN count and improving NP cough.   Neutropenic fever: Resolved   Heme/Onc (Dr. Gwenlyn Perking) consulted yesterday, and recommends sending patient home on Abx or no Abx at [facility] discretion, leukopenia is likely to be autoimmune in nature and requires no intervention at this time. Patient will be scheduled for follow up with rheumatology as outpatient. Thrombocytopenia is likely 2/2 splenomegaly 2/2 cirrhosis, no intervention needed for platelets >50000, will see her as outpatient in a few weeks. Blood Cx NGTD.  Patient remains afebrile this morning with minimal SOB and no dyspnea. Cough improving. -Heme/Onc consulted, appreciate recs - f/u rheumatology outpt - f/u heme/onc outpt - treating as per below -cont to monitor CBC     HAP: Stable On PO Levaquin for 5 day course, started 8/26. Today is day 3 of 5. Has remained afebrile throughout hospitalization.  VSS. Patient reports improving NP cough without fevers or chills this morning. Bibasilar rales appreciable on exam. Appropriate saturations on room air.  - continue Levaquin x 5 days (01/03/2018 - 01/07/2018).   -monitor resp status  -nasal saline spray  -tylenol prn  - tessalon perles for cough - duonebs prn for breathing  Pancytopenia: Chronic WBC 1.8>1.1>1.2.  ANC was down to 400 yesterday, 500 this AM.   Hgb stable at 10.6, platelets stable at 55.  Extensive work-up outpatient has come completed for rheumatology and hematology, no identified cause for pancytopenia at this time.  Heme/Onc consulted yesterday, states okay to send patient home on Abx or no Abx, believe leukopenia to be autoimmune in nature and requires no intervention at this time other than rheumatology follow up, thrombocytopenia likely 2/2 splenomegaly 2/2 cirrhosis, no intervention needed for platelets >50,000, will see her as outpatient in a few weeks.  -Heme/Onc consulted, appreciate recs -Droplet precautions -Recommend f/u outpatient with GI for routine colonoscopy -Needs Rheum and Heme/Onc f/u outpatient   -SW consulted, per PCP patient has had difficulty making appt due to lack of transportation as well as concerns for housing   T2DM: Chronic, well-controlled Home meds include metformin 500 mg BID.A1c on admission 5.1. -cont home metformin  PriorHx ofAsthma Patient states that she was told she does not have asthma and is no longer onsymbicort,talesalbuterol inhaler and nebs prn at home.Has not used recently. - duonebs prn for breathing  Hypothyroidism: Stable Takes synthroid 250 mcg at home.Last TSH 2.282 on 09/11/2017. -cont home synthroid250 mcg  Hx of Seizures: Stable Last seizure in1972. No longer on meds. - no intervention needed  Cirrhosis of the liver without ascites AST 48, ALT 25, TBili 3.2, 2.3 on admission. Denies history of ascites and paracentesis. - avoid hepatotoxic agents   FEN/GI:regular Prophylaxis:no pharmacologic ppx; SCDs  Disposition:D/c home today on levaquin 750 mg for two additional days -Social work consulted for social concerns as noted above   Subjective:  Holly Mueller states that her cough is greatly improved from admission and that she feels much better. She reports mild SOB but  denies fevers, chills, dyspnea or CP.  Holly Mueller is tearful during interview  this morning, stating that she is worried she will not have a home to return to following discharge and that she does not work enough to afford her half of the rent.  Overnight, she reports restless leg symptoms in her left LE that have since resolved.   Objective: Temp:  [97.6 F (36.4 C)-98.2 F (36.8 C)] 97.6 F (36.4 C) (08/28 0552) Pulse Rate:  [70-76] 70 (08/28 0552) Resp:  [18] 18 (08/28 0552) BP: (97-111)/(66-69) 98/66 (08/28 0552) SpO2:  [95 %-97 %] 95 % (08/28 0552)   Physical Exam: General: 52 y.o. female in NAD sitting up in bed with intermittent coughing Cardio: RRR no m/r/g, bilateral radial pulses palpated  HENT: normocephalic without trauma, pupils equal and reactive to light, oropharynx without erythema, exudate nor edematous tonsils, patent nostrils without edematous turbinates, neck supple, without lymphadenopathy, no thyromegaly  Lungs: CTAB, no wheezing, no rhonchi. Bibasilar crackles. Appropriate saturations on room air. Abdomen: Soft, non-tender to palpation, positive bowel sounds Skin: warm and dry Extremities: No edema Psych: tearful affect congruent with "worried" mood   Laboratory: Recent Labs  Lab 01/03/18 1125 01/04/18 0641 01/05/18 0436  WBC 1.8* 1.1* 1.2*  HGB 10.8* 10.8* 10.6*  HCT 31.6* 32.9* 31.7*  PLT 56* 56* 55*   Recent Labs  Lab 12/30/17 0610  01/03/18 0510 01/04/18 0641 01/05/18 0436  NA 137   < > 136 138 138  K 3.6   < > 3.7 4.0 3.9  CL 110   < > 108 107 110  CO2 22   < > _0 BUN 7   < > _1 CREATININE 0.68   < > 0.67 0.69 0.64  CALCIUM 8.7*   < > 8.7* 9.3 8.8*  PROT 7.0  --   --   --   --   BILITOT 3.2*  --   --   --   --   ALKPHOS 116  --   --   --   --   ALT 25  --   --   --   --   AST 48*  --   --   --   --   GLUCOSE 152*   < > 134* 147* 120*   < > = values in this interval not displayed.    Imaging/Diagnostic Tests: No results found.  Pattricia Boss, Medical Student 01/05/2018, 9:07 AM Acting Intern  pager: [phone]  FPTS Upper-Level Resident Addendum   I have independently interviewed and examined the patient. I have discussed the above with the original author and agree with their documentation. My edits for correction/addition/clarification are above. Please see also any attending notes.    Rory Percy, DO PGY-2, [place] [facility] 01/05/2018 2:51 PM  FPTS Service pager: [phone] (text pages welcome through [facility])

## 2018-01-05 NOTE — Social Work (Signed)
CSW acknowledging consult for social needs, full assessment completed with pt on 8/23, provided all available resources for pt prior to discharge. Bedside RN knows that CSW is available for transportation support at discharge if pt needs so.   CSW signing off. Please consult if any additional needs arise.  Alexander Mt, [place] Work [phone]

## 2018-01-07 ENCOUNTER — Encounter: Payer: Self-pay | Admitting: Adult Health

## 2018-01-07 ENCOUNTER — Telehealth: Payer: Self-pay | Admitting: Adult Health

## 2018-01-07 NOTE — Telephone Encounter (Signed)
Per email from Dr. Jana Hakim a hospital followup appt has been scheduled for the pt to see Wilber Bihari on 11/19  At 1130am and 11am for labs. Pt has been cld and made aware of the appt date and time. Letter mailed.

## 2018-01-12 ENCOUNTER — Encounter: Payer: Self-pay | Admitting: Family Medicine

## 2018-01-12 ENCOUNTER — Other Ambulatory Visit: Payer: Self-pay

## 2018-01-12 ENCOUNTER — Ambulatory Visit (INDEPENDENT_AMBULATORY_CARE_PROVIDER_SITE_OTHER): Payer: Self-pay | Admitting: Family Medicine

## 2018-01-12 VITALS — BP 126/62 | HR 74 | Temp 98.0°F | Ht 66.0 in | Wt 237.4 lb

## 2018-01-12 DIAGNOSIS — J189 Pneumonia, unspecified organism: Secondary | ICD-10-CM

## 2018-01-12 DIAGNOSIS — R5081 Fever presenting with conditions classified elsewhere: Secondary | ICD-10-CM

## 2018-01-12 DIAGNOSIS — D709 Neutropenia, unspecified: Secondary | ICD-10-CM

## 2018-01-12 DIAGNOSIS — Y95 Nosocomial condition: Secondary | ICD-10-CM

## 2018-01-12 DIAGNOSIS — E039 Hypothyroidism, unspecified: Secondary | ICD-10-CM

## 2018-01-12 DIAGNOSIS — K7460 Unspecified cirrhosis of liver: Secondary | ICD-10-CM

## 2018-01-12 NOTE — Progress Notes (Signed)
Subjective:    Holly Mueller is a 52 y.o. female who presents to [facility] today for hospital follow-up for neutropenia:  1.  Hospital follow-up: Patient admitted for febrile neutropenia.  Discharged last week.  She was discharged home with flora quinolone for hospital-acquired pneumonia after be treated with IV antibiotics.  She did have a day of increased malaise on Friday which was 2 days after discharge.  However she has been doing much better since then.  No further fevers.  No further chills.  Eating and drinking well.  Feels her usual self.  She has not been able to keep her appointments with her specialists secondary to not having any insurance.  She was recommended follow-up with both hematology and rheumatology as her neutropenia seems to be secondary to rheumatologic disease.  She is also recommended follow-up with gastroenterology for her cirrhosis.  But she has not been able to keep any appointments.  She also tells me she is being evicted from her house due to nonpayment this coming Friday.   ROS as above per HPI.  Pertinently, no chest pain, palpitations, SOB, Fever, Chills, Abd pain, N/V/D.   The following portions of the patient's history were reviewed and updated as appropriate: allergies, current medications, past medical history, family and social history, and problem list. Patient is a nonsmoker.    PMH reviewed.  Past Medical History:  Diagnosis Date  . Arthritis    bursitis left hip flares-not an issue  . Diabetes mellitus without complication ([place])   . Edentulous    10-19-13 at present  . Epilepsy ([place]) in 1972   No seizures since 1972. Previously treated with phenobarbital.   . Fever 06/2017  . GERD (gastroesophageal reflux disease) 2008  . Headache(784.0)    hx of migraines   . Hypothyroidism 2008  . Lower esophageal ring 08/18/2013  . Seasonal allergies 2003  . Shortness of breath    Past Surgical History:  Procedure Laterality Date  . BALLOON DILATION N/A  10/24/2013   Procedure: BALLOON DILATION;  Surgeon: Gatha Mayer, MD;  Location: [facility];  Service: Endoscopy;  Laterality: N/A;  . CESAREAN SECTION     x2  . DENTAL SURGERY     multiple extractions 3'15  . ESOPHAGOGASTRODUODENOSCOPY N/A 10/24/2013   Procedure: ESOPHAGOGASTRODUODENOSCOPY (EGD);  Surgeon: Gatha Mayer, MD;  Location: [facility];  Service: Endoscopy;  Laterality: N/A;  . LIVER BIOPSY  06/30/2012   Procedure: LIVER BIOPSY;  Surgeon: Shann Medal, MD;  Location: [facility];  Service: General;;  . NOVASURE ABLATION    . SHOULDER ARTHROSCOPY Right 2011  . UMBILICAL HERNIA REPAIR N/A 06/30/2012   Procedure: remove umbilicus;  Surgeon: Shann Medal, MD;  Location: [facility];  Service: General;  Laterality: N/A;  . VENTRAL HERNIA REPAIR N/A 06/30/2012   Procedure: LAPAROSCOPIC VENTRAL HERNIA;  Surgeon: Shann Medal, MD;  Location: [facility];  Service: General;  Laterality: N/A;  With Mesh  . WISDOM TOOTH EXTRACTION      Medications reviewed. Current Outpatient Medications  Medication Sig Dispense Refill  . ACCU-CHEK AVIVA PLUS test strip USE UP TO 4 TIMES DAILY AS DIRECTED 100 each 0  . ACCU-CHEK SOFTCLIX LANCETS lancets USE UP TO 4 TIMES DAILY AS DIRECTED 100 each 0  . albuterol (PROAIR HFA) 108 (90 Base) MCG/ACT inhaler INHALE 2 PUFFS INTO THE LUNGS EVERY 6 HOURS AS NEEDED FOR SHORTNESS OF BREATH 8.5 g 5  . albuterol (PROVENTIL) (2.5 MG/3ML) 0.083% nebulizer solution Take  3 mLs (2.5 mg total) by nebulization every 6 (six) hours as needed for wheezing or shortness of breath. 75 mL 2  . blood glucose meter kit and supplies KIT Dispense based on patient and insurance preference. Use up to four times daily as directed. (FOR ICD-9 250.00, 250.01). 1 each 0  . cetirizine (ZYRTEC) 10 MG tablet Take 1 tablet (10 mg total) by mouth daily. 30 tablet 11  . fluticasone (FLONASE) 50 MCG/ACT nasal spray Place 2 sprays into both nostrils daily.    Marland Kitchen ibuprofen (ADVIL,MOTRIN) 200 MG tablet  Take 600 mg by mouth every 6 (six) hours as needed for moderate pain.    Marland Kitchen levofloxacin (LEVAQUIN) 750 MG tablet Take 1 tablet (750 mg total) by mouth daily. 2 tablet 0  . levothyroxine (SYNTHROID, LEVOTHROID) 125 MCG tablet Take 2 tablets (250 mcg total) by mouth daily. 60 tablet 0  . metFORMIN (GLUCOPHAGE) 500 MG tablet Take 1 tablet (500 mg total) by mouth 2 (two) times daily with a meal. 180 tablet 0  . pantoprazole (PROTONIX) 40 MG tablet TAKE 1 TABLET BY MOUTH DAILY. 30 tablet 3  . promethazine-dextromethorphan (PROMETHAZINE-DM) 6.25-15 MG/5ML syrup Take 5 mLs by mouth 4 (four) times daily as needed for cough. 120 mL 0  . ursodiol (ACTIGALL) 500 MG tablet Take 500 mg by mouth 2 (two) times daily.  3   No current facility-administered medications for this visit.      Objective:   Physical Exam BP 126/62   Pulse 74   Temp 98 F (36.7 C) (Oral)   Ht _0  (1.676 m)   Wt 237 lb 6.4 oz (107.7 kg)   SpO2 96%   BMI 38.32 kg/m  Gen:  Alert, cooperative patient who appears stated age in no acute distress.  Vital signs reviewed. HEENT: EOMI,  MMM Neck:  Supple.  No LAD Cardiac:  Regular rate and rhythm with Grade II murmur heard throughout precordium.   Pulm:  Clear to auscultation bilaterally  Abd:  Soft/nondistended/nontender.   Exts: Non edematous BL  LE, warm and well perfused.  Skin:  No signs of cellulitis or abscess or other infection throughout.

## 2018-01-12 NOTE — Assessment & Plan Note (Signed)
She has finished antibiotics.  No need to further continue these.  She looks well overall.  Her lungs actually sound great today.  She has had no further cough.

## 2018-01-12 NOTE — Assessment & Plan Note (Signed)
Overall she is doing well.  She has not had any further fevers.  We are going to recheck her neutrophil count with CBC and differential today.  She has been kept out of work due to her neutropenia.  I will call her tomorrow and if it is improved we will put her back for work. -She needs to follow-up with hematology.  Also this is secondary to rheumatologic disease and therefore also need to follow-up with rheumatology.  As noted above she does not yet have finances and is trying to obtain Medicaid.  Currently only has family-planning Medicaid which does not pay for specialist or prescriptions

## 2018-01-12 NOTE — Assessment & Plan Note (Signed)
She is able to purchase her Synthroid refill with money from another friend.  Discussed about $45.  She is unclear if she will be able to apply this again in the future without insurance.

## 2018-01-12 NOTE — Patient Instructions (Signed)
It was good to see you again today.  We're checking labs today.  I'll call you with the results.    I'm glad that overall you're feeling better.

## 2018-01-12 NOTE — Assessment & Plan Note (Signed)
She has been unable to follow-up with gastroenterology secondary to lack of finances.

## 2018-01-13 ENCOUNTER — Telehealth: Payer: Self-pay

## 2018-01-13 LAB — BASIC METABOLIC PANEL
BUN/Creatinine Ratio: 16 (ref 9–23)
BUN: 11 mg/dL (ref 6–24)
CO2: 21 mmol/L (ref 20–29)
Calcium: 9.1 mg/dL (ref 8.7–10.2)
Chloride: 108 mmol/L — ABNORMAL HIGH (ref 96–106)
Creatinine, Ser: 0.69 mg/dL (ref 0.57–1.00)
GFR calc Af Amer: 116 mL/min/{1.73_m2} (ref >59)
GFR, EST NON AFRICAN AMERICAN: 100 mL/min/{1.73_m2} (ref >59)
Glucose: 120 mg/dL — ABNORMAL HIGH (ref 65–99)
Potassium: 4.3 mmol/L (ref 3.5–5.2)
Sodium: 141 mmol/L (ref 134–144)

## 2018-01-13 LAB — CBC WITH DIFFERENTIAL/PLATELET
BASOS: 2 %
Basophils Absolute: 0 10*3/uL (ref 0.0–0.2)
EOS (ABSOLUTE): 0.1 10*3/uL (ref 0.0–0.4)
Eos: 5 %
HEMATOCRIT: 33.1 % — AB (ref 34.0–46.6)
Hemoglobin: 11.1 g/dL (ref 11.1–15.9)
IMMATURE GRANS (ABS): 0 10*3/uL (ref 0.0–0.1)
Immature Granulocytes: 0 %
LYMPHS ABS: 0.5 10*3/uL — AB (ref 0.7–3.1)
LYMPHS: 33 %
MCH: 28.7 pg (ref 26.6–33.0)
MCHC: 33.5 g/dL (ref 31.5–35.7)
MCV: 86 fL (ref 79–97)
Monocytes Absolute: 0.2 10*3/uL (ref 0.1–0.9)
Monocytes: 13 %
NEUTROS ABS: 0.6 10*3/uL — AB (ref 1.4–7.0)
Neutrophils: 47 %
Platelets: 81 10*3/uL — CL (ref 150–450)
RBC: 3.87 x10E6/uL (ref 3.77–5.28)
RDW: 15.3 % (ref 12.3–15.4)
WBC: 1.4 10*3/uL — CL (ref 3.4–10.8)

## 2018-01-13 NOTE — Telephone Encounter (Signed)
Pt calling for results from [place]. Pt call back [phone] Wallace Cullens, RN

## 2018-01-13 NOTE — Telephone Encounter (Signed)
Pt called back had questions about when she can go back to work and if she still has to wear a mask. She needs a return to work note, and for it to say she no longer needs to wear the mask. Call back [phone] Wallace Cullens, RN

## 2018-01-13 NOTE — Telephone Encounter (Signed)
Called and number went immediately to voicemail.  I left a message stating that her white count is gone up minimally.  It was higher than when she left the hospital.  She is still neutropenic but better than she was.  If she is having any further symptoms or fever she should go to the emergency department but after seeing her yesterday in clinic I think she will be doing well.

## 2018-01-14 ENCOUNTER — Encounter: Payer: Self-pay | Admitting: Family Medicine

## 2018-01-14 ENCOUNTER — Ambulatory Visit: Payer: Self-pay | Admitting: Cardiovascular Disease

## 2018-01-14 NOTE — Telephone Encounter (Signed)
Patient calling again requesting work note as described below.  Danley Danker, RN [facility] RN)

## 2018-01-14 NOTE — Telephone Encounter (Signed)
Letter received from PCP. Placed at front desk for patient to pick up. Left voicemail for patient to inform her.  Danley Danker, RN [facility] RN)

## 2018-01-14 NOTE — Telephone Encounter (Signed)
Pt called again requesting letter as stated below.  Wallace Cullens, RN

## 2018-01-14 NOTE — Progress Notes (Signed)
I have completed the note as requested.

## 2018-01-15 ENCOUNTER — Telehealth: Payer: Self-pay | Admitting: Internal Medicine

## 2018-01-15 NOTE — Telephone Encounter (Signed)
Received after-hours call from [name].  She states she has had a 1d h/o LLE swelling with mild pitting; this does improve with elevation. She has mild swelling of RLE. She endorses associated pain with ambulation that seems to be localized to the anterior leg and radiates to her foot. She denies redness, increased warmth of leg; she denies fevers, chills, shortness of breath, hemoptysis, chest pain; she has a lingering, mild nonproductive cough from recent pneumonia.   A/P: Patient with recent hospitalization for PNA in setting of neutropenia thought to be due to underlying rheumatological process. Due to thrombocytopenia she did not receive VTE prophylaxis from chart review. She now has unilateral swelling of LE w/o associated inflammatory signs per her description. Etiology could be DVT, volume overload in setting of her h/o cirrhosis, dependent edema. Emergent evaluation not warranted based on her description of the swelling. I did advise her to go to UC in AM for evaluation; she will think about it vs coming into clinic on Monday morning. I advised that should she have change in her swelling, or develop fevers, chest pain, shortness of breath that she should seek immediate attention in the ED. She stated understanding and had no further questions.  Alphonzo Grieve, MD

## 2018-01-18 ENCOUNTER — Ambulatory Visit: Payer: Medicaid Other

## 2018-01-25 ENCOUNTER — Ambulatory Visit (INDEPENDENT_AMBULATORY_CARE_PROVIDER_SITE_OTHER): Payer: Self-pay | Admitting: Family Medicine

## 2018-01-25 ENCOUNTER — Other Ambulatory Visit (HOSPITAL_COMMUNITY)
Admission: RE | Admit: 2018-01-25 | Discharge: 2018-01-25 | Disposition: A | Discharge status: Home / Self Care | Payer: Medicaid Other | Source: Ambulatory Visit | Attending: Family Medicine | Admitting: Family Medicine

## 2018-01-25 ENCOUNTER — Other Ambulatory Visit: Payer: Self-pay

## 2018-01-25 ENCOUNTER — Encounter: Payer: Self-pay | Admitting: Family Medicine

## 2018-01-25 VITALS — BP 115/64 | HR 74 | Temp 98.3°F | Wt 237.0 lb

## 2018-01-25 DIAGNOSIS — Z7189 Other specified counseling: Secondary | ICD-10-CM | POA: Insufficient documentation

## 2018-01-25 DIAGNOSIS — Z124 Encounter for screening for malignant neoplasm of cervix: Secondary | ICD-10-CM | POA: Insufficient documentation

## 2018-01-25 DIAGNOSIS — Z0000 Encounter for general adult medical examination without abnormal findings: Secondary | ICD-10-CM

## 2018-01-25 DIAGNOSIS — Z5941 Food insecurity: Secondary | ICD-10-CM

## 2018-01-25 DIAGNOSIS — Z594 Lack of adequate food and safe drinking water: Secondary | ICD-10-CM | POA: Insufficient documentation

## 2018-01-25 NOTE — Patient Instructions (Signed)
It was a pleasure to see you today! Thank you for choosing [facility] for your primary care. Holly Mueller was seen for care coordination, pap.   Our plans for today were:  I will call you if your PAP is abnormal.   Please ask for the orange card information out front.   I will have Neoma Laming call you about resources.   Best,  Dr. Lindell Noe

## 2018-01-25 NOTE — Progress Notes (Signed)
CC: follow up care coordination  HPI  Kids are getting over a cold, they are 18 and 19. They ran fever 1 week ago, and now better. She thought she was getting the cold, but she didn't get a fever. She feels ok, but not 100%. She knows she should go to the ED if febrile. Still tired all the time. Worked one day last week, they wouldn't let her work with the mask on. 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  Friend had to pay for her medicine. Says her thyroid medicine is $45 and she didn't even have $8 for one of her other medicines. Didn't bring them today. She does have a case worker she mentions, and she is working on Engineer, mining, but she feels like she is getting the run around with her job and another Electronics engineer.   Leg swelling -She called with leg swelling last week. More L than the R. She had friends look at it that also thought L was greater. No new SOB. She does endorse some SOB when she lies flat. Has been using the incentive spirometer. Swelling has been coming and going, feels better than last week. Worse last night in both legs after being out doing errands all day. Leg aches at night.   ROS: Denies CP, SOB, abdominal pain, dysuria, changes in BMs.   CC, SH/smoking status, and VS noted  Objective: BP 115/64 (BP Location: Right Arm)   Pulse 74   Temp 98.3 F (36.8 C) (Oral)   Wt 237 lb (107.5 kg)   SpO2 94%   BMI 38.25 kg/m  Gen: NAD, alert, cooperative, and pleasant. HEENT: NCAT, EOMI, PERRL CV: RRR, no murmur Resp: CTAB, no wheezes, non-labored Ext: 1+ edema bilaterally, negative homans sign,no calf tenderness GU: normal external female genitalia, no adnexal tenderness.  Neuro: Alert and oriented, Speech clear, No gross deficits  Assessment and plan:  Leg swelling: likely dependent edema, no signs of DVT on my exam. If worsening or more asymmetrical, seek emergency care. Elevate and use compression.   Med affordability: patient is in an  exceedingly difficult position with lack of finances and difficulty navigating the medicaid and disability systems. I did inform her that to the best of my knowledge, she would have to not be employed at all in order to begin the disability process. She could try to apply for the orange card in the meantime to cover GI and here. She will ask about this paperwork at the front office. I will also reach out to our pharmacy team to consider MAP for her.  Food insecurity: patient will follow up with deborah at next visit for more food from food box program.  PAP performed today.   Ralene Ok, MD, PGY3 01/28/2018 12:20 PM

## 2018-01-26 ENCOUNTER — Telehealth: Payer: Self-pay

## 2018-01-26 NOTE — Telephone Encounter (Signed)
Patient called and stated that at [place] yesterday she thought PCP was going to call around about prescription prices. Has that been done?  Patient will call back later today, using a friend's phone because hers was turned off.  Danley Danker, RN [facility] RN)

## 2018-01-26 NOTE — Telephone Encounter (Signed)
If patient calls back (she has phone issues), we are working on getting Dr. Graylin Shiver help about medication costs, but this won't be an immediate fix (she may be able to use MAP). Is she out of anything? Can she tell us how much of the ursadiol she has left? That one is going to be $75 almost anywhere because it is a specialty medication.

## 2018-01-27 LAB — CYTOLOGY - PAP
ADEQUACY: ABSENT
Diagnosis: NEGATIVE
HPV: NOT DETECTED

## 2018-01-28 ENCOUNTER — Telehealth: Payer: Self-pay | Admitting: *Deleted

## 2018-01-28 NOTE — Telephone Encounter (Signed)
-----  Message from [name], MD sent at 01/28/2018  8:43 AM EDT ----- If patient calls, her pap smear was normal and she can do another in 5 years.

## 2018-01-28 NOTE — Telephone Encounter (Signed)
Tried to contact pt to inform her of below and phone said call could not be completed at this time.  If pt calls back please inform her of below. Katharina Caper, April D, [place]

## 2018-02-26 ENCOUNTER — Telehealth: Payer: Self-pay | Admitting: Family Medicine

## 2018-02-26 NOTE — Telephone Encounter (Signed)
After Hours/Emergency Line Call  Received after hours page from patient regarding hyperglycemia in the 340s.  Patient reports eating potatoes for dinner yesterday evening and checked her sugar which was found to be 341.  She checked another finger on her to be sure and found her glucose to be persistently elevated in the 340s.  Patient is currently only on metformin.  She denies fevers or chills, vomiting or diarrhea.  She is not currently on insulin.  I discussed red flags with patient and advised her to drink water and get some rest followed by rechecking her testing CBG in the morning when she wakes up.  If elevated, advised patient to recheck on other hand to confirm.  Advised patient to go to urgent care if hyperglycemia persists.  Again, we discussed red flags and reasons to call 911.  Patient stated she understood and had no other concerns this evening.  Will forward to PCP.  Harriet Butte, [place], PGY-3

## 2018-02-28 ENCOUNTER — Emergency Department (HOSPITAL_COMMUNITY): Payer: Self-pay

## 2018-02-28 ENCOUNTER — Encounter (HOSPITAL_COMMUNITY): Payer: Self-pay

## 2018-02-28 ENCOUNTER — Other Ambulatory Visit: Payer: Self-pay

## 2018-02-28 ENCOUNTER — Emergency Department (HOSPITAL_COMMUNITY)
Admission: EM | Admit: 2018-02-28 | Discharge: 2018-02-28 | Disposition: A | Discharge status: Home / Self Care | Payer: Self-pay | Attending: Emergency Medicine | Admitting: Emergency Medicine

## 2018-02-28 DIAGNOSIS — E119 Type 2 diabetes mellitus without complications: Secondary | ICD-10-CM | POA: Insufficient documentation

## 2018-02-28 DIAGNOSIS — Z87891 Personal history of nicotine dependence: Secondary | ICD-10-CM | POA: Insufficient documentation

## 2018-02-28 DIAGNOSIS — E039 Hypothyroidism, unspecified: Secondary | ICD-10-CM | POA: Insufficient documentation

## 2018-02-28 DIAGNOSIS — R059 Cough, unspecified: Secondary | ICD-10-CM

## 2018-02-28 DIAGNOSIS — Z79899 Other long term (current) drug therapy: Secondary | ICD-10-CM | POA: Insufficient documentation

## 2018-02-28 DIAGNOSIS — R05 Cough: Secondary | ICD-10-CM | POA: Insufficient documentation

## 2018-02-28 MED ORDER — BENZONATATE 100 MG PO CAPS: 100.0000 mg | ORAL_CAPSULE | Freq: Three times a day (TID) | ORAL | 0 refills | Status: DC

## 2018-02-28 MED ORDER — PREDNISONE 10 MG (21) PO TBPK: ORAL_TABLET | ORAL | 0 refills | Status: DC

## 2018-02-28 MED ORDER — AZITHROMYCIN 250 MG PO TABS: 250.0000 mg | ORAL_TABLET | Freq: Every day | ORAL | 0 refills | Status: DC

## 2018-02-28 NOTE — ED Notes (Signed)
Patient transported to X-ray 

## 2018-02-28 NOTE — Discharge Instructions (Addendum)
Your symptoms are likely consistent with a viral illness. Viruses do not require or respond to antibiotics, however, due to your past medical history, your risk of a bacterial illness is increased.  Therefore, we will also try a course of antibiotics. Treatment is symptomatic care and it is important to note that these symptoms may last for 7-14 days.    Hand washing: Wash your hands throughout the day, but especially before and after touching the face, using the restroom, sneezing, coughing, or touching surfaces that have been coughed or sneezed upon. Hydration: Symptoms will be intensified and complicated by dehydration. Dehydration can also extend the duration of symptoms. Drink plenty of fluids and get plenty of rest. You should be drinking at least half a liter of water an hour to stay hydrated. Electrolyte drinks (ex. Gatorade, Powerade, Pedialyte) are also encouraged. You should be drinking enough fluids to make your urine light yellow, almost clear. If this is not the case, you are not drinking enough water. Please note that some of the treatments indicated below will not be effective if you are not adequately hydrated. Pain or fever:  acetaminophen (generic for Tylenol) for pain or fever.  Cough: Use the benzonatate (generic for Tessalon) for cough.  Prednisone: Take the prednisone, as directed, in its entirety. Zyrtec or Claritin: May add these medication daily to control underlying symptoms of congestion, sneezing, and other signs of allergies.  These medications are available over-the-counter. Generics: Cetirizine (generic for Zyrtec) and loratadine (generic for Claritin). Fluticasone: Use fluticasone (generic for Flonase), as directed, for nasal and sinus congestion.  This medication is available over-the-counter. Congestion: Plain guaifenesin (generic for plain Mucinex) may help relieve congestion. Saline sinus rinses and saline nasal sprays may also help relieve congestion.  Sore throat:  Warm liquids or Chloraseptic spray may help soothe a sore throat. Gargle twice a day with a salt water solution made from a half teaspoon of salt in a cup of warm water.  Follow up: Follow up with a primary care provider within the next two weeks should symptoms fail to resolve. Return: Return to the ED for significantly worsening symptoms, shortness of breath, persistent vomiting, large amounts of blood in stool, or any other major concerns.  For prescription assistance, may try using prescription discount sites or apps, such as goodrx.com

## 2018-02-28 NOTE — ED Triage Notes (Signed)
Pt here by EMS for a non productive cough that has been going on for a week.  Initially HTN but on arrival normal tensive.  Afebrile in triage.  Diabetic with hx of cirrhosis. A&Ox4.

## 2018-02-28 NOTE — ED Provider Notes (Signed)
[facility] Provider Note   CSN: [id number] Arrival date & time: 02/28/18  1546     History   Chief Complaint Chief Complaint  Patient presents with  . Cough    HPI Holly Mueller is a 52 y.o. female.  HPI   Holly Mueller is a 52 y.o. female, with a history of DM, hypothyroidism, GERD, presenting to the ED with nonproductive cough for about the last week. Cough was productive, but now dry. Was accompanied by body aches, but these have also improved.  Intermittent nasal congestion.  Denies fever/chills, chest pain, shortness of breath, N/V/D, abdominal pain, or any other complaints    Past Medical History:  Diagnosis Date  . Arthritis    bursitis left hip flares-not an issue  . Diabetes mellitus without complication ([place])   . Edentulous    10-19-13 at present  . Epilepsy ([place]) in 1972   No seizures since 1972. Previously treated with phenobarbital.   . Fever 06/2017  . GERD (gastroesophageal reflux disease) 2008  . Headache(784.0)    hx of migraines   . Hypothyroidism 2008  . Lower esophageal ring 08/18/2013  . Seasonal allergies 2003  . Shortness of breath     Patient Active Problem List   Diagnosis Date Noted  . Follow-up exam   . HAP (hospital-acquired pneumonia)   . Febrile illness   . Restless leg 12/28/2017  . Fever of unknown origin 12/28/2017  . Food insecurity 10/15/2017  . Weakness 10/06/2017  . Hyperglycemia 10/06/2017  . Chest pain 09/11/2017  . Ectatic aorta ([place]) 09/11/2017  . Chest pressure 09/11/2017  . Cellulitis of left lower extremity   . Pulmonary edema 06/30/2017  . Left leg pain   . Type 2 diabetes mellitus without complication, without long-term current use of insulin ([place])   . Bibasilar crackles   . Influenza-like illness   . Neutropenic fever ([place]) 06/14/2017  . Cirrhosis of liver without ascites ([place]) 11/17/2016  . Thrombocytopenia ([place]) 11/17/2016  . Leucopenia 11/17/2016  .  Bicytopenia 10/24/2016  . Epistaxis 10/23/2016  . GERD (gastroesophageal reflux disease) 10/23/2016  . Cough 12/09/2015  . Chronic pain of left thumb 10/09/2015  . Precordial chest pain 08/03/2015  . Abscess of skin of abdomen 07/10/2014  . Bursitis of left hip 09/14/2013  . Lower esophageal ring 08/18/2013  . DOE (dyspnea on exertion) 08/17/2013  . Ganglion cyst of wrist 08/24/2012  . Right hip pain 08/23/2012  . Umbilical hernia 02/63/7858  . Healthcare maintenance 02/14/2012  . Osteoarthritis 02/12/2012  . Elevated LFTs 10/30/2011  . Allergic rhinitis 08/26/2011  . Obesity 07/08/2010  . SEIZURES, HX OF 12/12/2009  . DENTAL CARIES 12/18/2008  . DEPRESSION 07/26/2008  . MIGRAINE HEADACHE 11/15/2007  . Hypothyroidism 10/12/2006  . Restrictive lung disease 07/30/2006    Past Surgical History:  Procedure Laterality Date  . BALLOON DILATION N/A 10/24/2013   Procedure: BALLOON DILATION;  Surgeon: Gatha Mayer, MD;  Location: [facility];  Service: Endoscopy;  Laterality: N/A;  . CESAREAN SECTION     x2  . DENTAL SURGERY     multiple extractions 3'15  . ESOPHAGOGASTRODUODENOSCOPY N/A 10/24/2013   Procedure: ESOPHAGOGASTRODUODENOSCOPY (EGD);  Surgeon: Gatha Mayer, MD;  Location: [facility];  Service: Endoscopy;  Laterality: N/A;  . LIVER BIOPSY  06/30/2012   Procedure: LIVER BIOPSY;  Surgeon: Shann Medal, MD;  Location: [facility] ORS;  Service: General;;  . NOVASURE ABLATION    . SHOULDER ARTHROSCOPY  Right 2011  . UMBILICAL HERNIA REPAIR N/A 06/30/2012   Procedure: remove umbilicus;  Surgeon: Shann Medal, MD;  Location: [facility];  Service: General;  Laterality: N/A;  . VENTRAL HERNIA REPAIR N/A 06/30/2012   Procedure: LAPAROSCOPIC VENTRAL HERNIA;  Surgeon: Shann Medal, MD;  Location: [facility];  Service: General;  Laterality: N/A;  With Mesh  . WISDOM TOOTH EXTRACTION       OB History   None      Home Medications    Prior to Admission medications   Medication Sig  Start Date End Date Taking? Authorizing Provider  ACCU-CHEK AVIVA PLUS test strip USE UP TO 4 TIMES DAILY AS DIRECTED 11/22/17   [name], MD  ACCU-CHEK SOFTCLIX LANCETS lancets USE UP TO 4 TIMES DAILY AS DIRECTED 11/22/17   [name], MD  albuterol (PROAIR HFA) 108 (90 Base) MCG/ACT inhaler INHALE 2 PUFFS INTO THE LUNGS EVERY 6 HOURS AS NEEDED FOR SHORTNESS OF BREATH 10/23/16   Mercy Riding, MD  albuterol (PROVENTIL) (2.5 MG/3ML) 0.083% nebulizer solution Take 3 mLs (2.5 mg total) by nebulization every 6 (six) hours as needed for wheezing or shortness of breath. 10/23/16   Mercy Riding, MD  azithromycin (ZITHROMAX) 250 MG tablet Take 1 tablet (250 mg total) by mouth daily. Take first 2 tablets together, then 1 every day until finished. 02/28/18   Avneet Ashmore C, PA-C  benzonatate (TESSALON) 100 MG capsule Take 1 capsule (100 mg total) by mouth every 8 (eight) hours. 02/28/18   Ariele Vidrio C, PA-C  blood glucose meter kit and supplies KIT Dispense based on patient and insurance preference. Use up to four times daily as directed. (FOR ICD-9 250.00, 250.01). 06/19/17   Rory Percy, DO  cetirizine (ZYRTEC) 10 MG tablet Take 1 tablet (10 mg total) by mouth daily. 01/27/17   [name], MD  fluticasone (FLONASE) 50 MCG/ACT nasal spray Place 2 sprays into both nostrils daily.    [provider]  ibuprofen (ADVIL,MOTRIN) 200 MG tablet Take 600 mg by mouth every 6 (six) hours as needed for moderate pain.    [provider]  levothyroxine (SYNTHROID, LEVOTHROID) 125 MCG tablet Take 2 tablets (250 mcg total) by mouth daily. 01/05/18   Rory Percy, DO  metFORMIN (GLUCOPHAGE) 500 MG tablet Take 1 tablet (500 mg total) by mouth 2 (two) times daily with a meal. 11/25/17   [name], MD  pantoprazole (PROTONIX) 40 MG tablet TAKE 1 TABLET BY MOUTH DAILY. 07/20/17   [name], MD  predniSONE (STERAPRED UNI-PAK 21 TAB) 10 MG (21) TBPK tablet Take 6 tabs  (29m) day 1, 5 tabs (561m day 2, 4 tabs (409mday 3, 3 tabs (21m59may 4, 2 tabs (20mg34my 5, and 1 tab (10mg)25m 6. 02/28/18   Talley Kreiser C, PA-C  promethazine-dextromethorphan (PROMETHAZINE-DM) 6.25-15 MG/5ML syrup Take 5 mLs by mouth 4 (four) times daily as needed for cough. 06/19/17   RumbalRory Percyursodiol (ACTIGALL) 500 MG tablet Take 500 mg by mouth 2 (two) times daily. 10/08/17   [provider]    Family History Family History  Problem Relation Age of Onset  . Hypertension Mother   . Diabetes Mother   . Lung cancer Father   . Stroke Father   . Diabetes Sister   . Hypertension Sister     Social History Social History   Tobacco Use  . Smoking status: Former Smoker    Packs/day: 0.25  Types: Cigarettes    Last attempt to quit: 1997    Years since quitting: 22.8  . Smokeless tobacco: Never Used  Substance Use Topics  . Alcohol use: Yes    Comment: occ  . Drug use: No     Allergies   Aspirin   Review of Systems Review of Systems  Constitutional: Negative for chills, diaphoresis and fever.  HENT: Positive for congestion.   Respiratory: Positive for cough. Negative for shortness of breath.   Cardiovascular: Negative for chest pain and leg swelling.  Gastrointestinal: Negative for abdominal pain, diarrhea, nausea and vomiting.  Neurological: Negative for dizziness and light-headedness.  All other systems reviewed and are negative.    Physical Exam Updated Vital Signs BP (!) 124/99   Pulse 79   Temp 98.5 F (36.9 C) (Oral)   Resp 19   SpO2 100%   Physical Exam  Constitutional: She appears well-developed and well-nourished. No distress.  HENT:  Head: Normocephalic and atraumatic.  Nose: Mucosal edema present.  Eyes: Conjunctivae are normal.  Neck: Neck supple.  Cardiovascular: Normal rate, regular rhythm, normal heart sounds and intact distal pulses.  Pulmonary/Chest: Effort normal and breath sounds normal. No respiratory distress.    No increased work of breathing.  Speaks in full sentences without difficulty.  Abdominal: Soft. There is no tenderness. There is no guarding.  Musculoskeletal: She exhibits no edema.  Lymphadenopathy:    She has no cervical adenopathy.  Neurological: She is alert.  Skin: Skin is warm and dry. She is not diaphoretic.  Psychiatric: She has a normal mood and affect. Her behavior is normal.  Nursing note and vitals reviewed.    ED Treatments / Results  Labs (all labs ordered are listed, but only abnormal results are displayed) Labs Reviewed - No data to display  EKG None  Radiology Dg Chest 2 View  Result Date: 02/28/2018 CLINICAL DATA:  Productive cough for 2 days EXAM: CHEST - 2 VIEW COMPARISON:  01/01/2018 FINDINGS: Cardiac shadow is at the upper limits of normal in size. Mild bronchitic changes are again noted and stable. No confluent infiltrate is seen. Some crowding of the vascular markings due to poor inspiratory effort is noted. No bony abnormality is seen. IMPRESSION: Chronic appearing changes without acute abnormality. Electronically Signed   By: Inez Catalina M.D.   On: 02/28/2018 17:28    Procedures Procedures (including critical care time)  Medications Ordered in ED Medications - No data to display   Initial Impression / Assessment and Plan / ED Course  I have reviewed the triage vital signs and the nursing notes.  Pertinent labs & imaging results that were available during my care of the patient were reviewed by me and considered in my medical decision making (see chart for details).     Patient presents with a cough for the past week. Patient is nontoxic appearing, afebrile, not tachycardic, not tachypneic, not hypotensive, excellent SPO2 on room air, and is in no apparent distress.  PCP follow-up. The patient was given instructions for home care as well as return precautions. Patient voices understanding of these instructions, accepts the plan, and is comfortable  with discharge.  Final Clinical Impressions(s) / ED Diagnoses   Final diagnoses:  Cough    ED Discharge Orders         Ordered    benzonatate (TESSALON) 100 MG capsule  Every 8 hours     02/28/18 1838    predniSONE (STERAPRED UNI-PAK 21 TAB) 10 MG (21) TBPK  tablet     02/28/18 1838    azithromycin (ZITHROMAX) 250 MG tablet  Daily     02/28/18 1838           Layla Maw 02/28/18 1840    Fredia Sorrow, MD 03/01/18 [phone]

## 2018-03-03 ENCOUNTER — Other Ambulatory Visit: Payer: Self-pay

## 2018-03-03 ENCOUNTER — Encounter: Payer: Self-pay | Admitting: Family Medicine

## 2018-03-03 ENCOUNTER — Ambulatory Visit (INDEPENDENT_AMBULATORY_CARE_PROVIDER_SITE_OTHER): Payer: Self-pay | Admitting: Family Medicine

## 2018-03-03 VITALS — BP 112/62 | HR 71 | Temp 97.9°F | Ht 66.0 in | Wt 243.4 lb

## 2018-03-03 DIAGNOSIS — K7460 Unspecified cirrhosis of liver: Secondary | ICD-10-CM

## 2018-03-03 DIAGNOSIS — E119 Type 2 diabetes mellitus without complications: Secondary | ICD-10-CM

## 2018-03-03 DIAGNOSIS — J22 Unspecified acute lower respiratory infection: Secondary | ICD-10-CM | POA: Insufficient documentation

## 2018-03-03 DIAGNOSIS — D709 Neutropenia, unspecified: Secondary | ICD-10-CM

## 2018-03-03 DIAGNOSIS — R531 Weakness: Secondary | ICD-10-CM

## 2018-03-03 DIAGNOSIS — R0600 Dyspnea, unspecified: Secondary | ICD-10-CM | POA: Insufficient documentation

## 2018-03-03 LAB — POCT GLYCOSYLATED HEMOGLOBIN (HGB A1C): HBA1C, POC (CONTROLLED DIABETIC RANGE): 5.4 % (ref 0.0–7.0)

## 2018-03-03 LAB — GLUCOSE, POCT (MANUAL RESULT ENTRY): POC Glucose: 155 mg/dl — AB (ref 70–99)

## 2018-03-03 MED ORDER — URSODIOL 500 MG PO TABS: 500.0000 mg | ORAL_TABLET | Freq: Two times a day (BID) | ORAL | 3 refills | Status: DC

## 2018-03-03 MED ORDER — METFORMIN HCL 500 MG PO TABS: 500.0000 mg | ORAL_TABLET | Freq: Two times a day (BID) | ORAL | 2 refills | Status: DC

## 2018-03-03 NOTE — Assessment & Plan Note (Signed)
Check labs.  I conceptualize this as most likely due to an acute infectious illness.  Less likely is an acute CHF exacerbation.

## 2018-03-03 NOTE — Progress Notes (Signed)
   Subjective:    Patient ID: Holly Mueller, female    DOB: 1966-05-08, 52 y.o.   MRN: [id number]  HPI Acute illness that is a bit difficult to piece together.  Exposed to multiple people who are sick at work.  Started feeling bad 3 days ago, went to ER and was treated with antibiotics, tessalon and prednisone despite a negative CXR.  Dubious history of asthma.  Does have "cirrhosis of the liver," Diabetes and an echocardiogram from 06/2017 suggestive of grade 1 diastolic dysfunction.  Since ER visit, she has improved slightly.  Still quite tired.  No documented fever.  Home blood sugars have been quite high (on pred).  Also complains of some leg welling.  Further review of chart indicates that she has chronic leukopenia, which could cause immunocompromise.    Review of Systems     Objective:   Physical Exam VS and wt noted.  Wt is up five pounds. HEENT WNL Neck, no sig nodes. Lungs bibasilar rales. Cardiac RRR without m or g abd benign. Ext 2+ edema.       Assessment & Plan:

## 2018-03-03 NOTE — Assessment & Plan Note (Signed)
With Feb echo, wt gain and crackles on lung exam, I am worried about possible CHF.  And I don't want diuresis unless I get more confirmation.  Will await BNP and diurese if elevated.

## 2018-03-03 NOTE — Assessment & Plan Note (Signed)
Recheck CBC.  Leukopenia has been pronounced.  Also low platelets and borderline low Hgb, so pancytopenic which could go along with hypersplenism.

## 2018-03-03 NOTE — Assessment & Plan Note (Signed)
Long term good control.  DC steroids.  Not helping.  No asthma or COPD.  Making BS worse.

## 2018-03-03 NOTE — Assessment & Plan Note (Signed)
Perhaps this is all ILI.  But she is immunocompromised with leukopenia.  Await labs.  Continue antibiotics from ER although no convincing evidence that I am dealing with a bacterial infection.

## 2018-03-03 NOTE — Patient Instructions (Addendum)
I will call tomorrow with blood test results Stay on the antibiotic Stop the prednisone - it is running your blood sugar up. I do think this is an infection.  It should start getting better soon. I may need to put you on a fluid pill.  Fluid may be making your cough worse.

## 2018-03-04 ENCOUNTER — Telehealth: Payer: Self-pay | Admitting: Family Medicine

## 2018-03-04 DIAGNOSIS — K7460 Unspecified cirrhosis of liver: Secondary | ICD-10-CM

## 2018-03-04 DIAGNOSIS — E119 Type 2 diabetes mellitus without complications: Secondary | ICD-10-CM

## 2018-03-04 LAB — CMP14+EGFR
A/G RATIO: 0.8 — AB (ref 1.2–2.2)
ALT: 19 IU/L (ref 0–32)
AST: 33 IU/L (ref 0–40)
Albumin: 3.5 g/dL (ref 3.5–5.5)
Alkaline Phosphatase: 127 IU/L — ABNORMAL HIGH (ref 39–117)
BILIRUBIN TOTAL: 1.3 mg/dL — AB (ref 0.0–1.2)
BUN / CREAT RATIO: 11 (ref 9–23)
BUN: 7 mg/dL (ref 6–24)
CALCIUM: 8.7 mg/dL (ref 8.7–10.2)
CO2: 18 mmol/L — ABNORMAL LOW (ref 20–29)
CREATININE: 0.66 mg/dL (ref 0.57–1.00)
Chloride: 107 mmol/L — ABNORMAL HIGH (ref 96–106)
GFR calc Af Amer: 117 mL/min/{1.73_m2} (ref >59)
GFR calc non Af Amer: 101 mL/min/{1.73_m2} (ref >59)
GLOBULIN, TOTAL: 4.2 g/dL (ref 1.5–4.5)
Glucose: 143 mg/dL — ABNORMAL HIGH (ref 65–99)
Potassium: 3.7 mmol/L (ref 3.5–5.2)
SODIUM: 139 mmol/L (ref 134–144)
TOTAL PROTEIN: 7.7 g/dL (ref 6.0–8.5)

## 2018-03-04 LAB — CBC WITH DIFFERENTIAL/PLATELET
BASOS ABS: 0 10*3/uL (ref 0.0–0.2)
BASOS: 1 %
EOS (ABSOLUTE): 0.1 10*3/uL (ref 0.0–0.4)
Eos: 3 %
HEMATOCRIT: 34 % (ref 34.0–46.6)
Hemoglobin: 11.9 g/dL (ref 11.1–15.9)
IMMATURE GRANS (ABS): 0 10*3/uL (ref 0.0–0.1)
Immature Granulocytes: 1 %
LYMPHS ABS: 0.6 10*3/uL — AB (ref 0.7–3.1)
Lymphs: 29 %
MCH: 29.3 pg (ref 26.6–33.0)
MCHC: 35 g/dL (ref 31.5–35.7)
MCV: 84 fL (ref 79–97)
Monocytes Absolute: 0.2 10*3/uL (ref 0.1–0.9)
Monocytes: 9 %
NEUTROS ABS: 1.2 10*3/uL — AB (ref 1.4–7.0)
Neutrophils: 57 %
PLATELETS: 59 10*3/uL — AB (ref 150–450)
RBC: 4.06 x10E6/uL (ref 3.77–5.28)
RDW: 13.7 % (ref 12.3–15.4)
WBC: 2.1 10*3/uL — CL (ref 3.4–10.8)

## 2018-03-04 LAB — BRAIN NATRIURETIC PEPTIDE: BNP: 82.6 pg/mL (ref 0.0–100.0)

## 2018-03-04 MED ORDER — METFORMIN HCL 500 MG PO TABS: 500.0000 mg | ORAL_TABLET | Freq: Two times a day (BID) | ORAL | 2 refills | Status: DC

## 2018-03-04 NOTE — Telephone Encounter (Signed)
Pt calls again.  She is unable to pick up either medication the ursodiol cost $90 and the metformin cost $8.  She has no money right now.  Found out that her metformin would be $4 @ [facility]. She states that she cant afford that.  Offered gift cards, but she has not way to get here before 5pm.   She ask that I sent metformin to [facility] and she will try to get this weekend.  Done as requested.   She would like to know the results of her Labs. Fleeger, [name], CMA

## 2018-03-04 NOTE — Telephone Encounter (Signed)
Pt is returning call concerning her results from blood work done yesterday.

## 2018-03-04 NOTE — Telephone Encounter (Signed)
Pt calling again to check status. Fleeger, [name], CMA

## 2018-03-07 MED ORDER — FUROSEMIDE 40 MG PO TABS: 40.0000 mg | ORAL_TABLET | Freq: Every day | ORAL | 3 refills | Status: DC

## 2018-03-07 NOTE — Telephone Encounter (Signed)
Called.  Still feels DOE.  BNP OK but based on crackles on lung exam, increased wt and dx of cirrhosis, I will add daily lasix.  Also gave lab reports.  Asked her to schedule a follow up visit.

## 2018-03-12 ENCOUNTER — Encounter (HOSPITAL_COMMUNITY): Payer: Self-pay | Admitting: Emergency Medicine

## 2018-03-12 ENCOUNTER — Emergency Department (HOSPITAL_COMMUNITY)
Admission: EM | Admit: 2018-03-12 | Discharge: 2018-03-12 | Disposition: A | Discharge status: Home / Self Care | Payer: Self-pay | Attending: Emergency Medicine | Admitting: Emergency Medicine

## 2018-03-12 ENCOUNTER — Other Ambulatory Visit: Payer: Self-pay

## 2018-03-12 ENCOUNTER — Emergency Department (HOSPITAL_COMMUNITY): Payer: Self-pay

## 2018-03-12 DIAGNOSIS — Z87891 Personal history of nicotine dependence: Secondary | ICD-10-CM | POA: Insufficient documentation

## 2018-03-12 DIAGNOSIS — E119 Type 2 diabetes mellitus without complications: Secondary | ICD-10-CM | POA: Insufficient documentation

## 2018-03-12 DIAGNOSIS — Z79899 Other long term (current) drug therapy: Secondary | ICD-10-CM | POA: Insufficient documentation

## 2018-03-12 DIAGNOSIS — R0602 Shortness of breath: Secondary | ICD-10-CM | POA: Insufficient documentation

## 2018-03-12 DIAGNOSIS — E039 Hypothyroidism, unspecified: Secondary | ICD-10-CM | POA: Insufficient documentation

## 2018-03-12 DIAGNOSIS — R531 Weakness: Secondary | ICD-10-CM | POA: Insufficient documentation

## 2018-03-12 LAB — CBC WITH DIFFERENTIAL/PLATELET
ABS IMMATURE GRANULOCYTES: 0.01 10*3/uL (ref 0.00–0.07)
BASOS PCT: 2 %
Basophils Absolute: 0 10*3/uL (ref 0.0–0.1)
EOS ABS: 0.1 10*3/uL (ref 0.0–0.5)
Eosinophils Relative: 4 %
HCT: 35.5 % — ABNORMAL LOW (ref 36.0–46.0)
Hemoglobin: 11.3 g/dL — ABNORMAL LOW (ref 12.0–15.0)
Immature Granulocytes: 1 %
Lymphocytes Relative: 24 %
Lymphs Abs: 0.4 10*3/uL — ABNORMAL LOW (ref 0.7–4.0)
MCH: 27.6 pg (ref 26.0–34.0)
MCHC: 31.8 g/dL (ref 30.0–36.0)
MCV: 86.6 fL (ref 80.0–100.0)
MONO ABS: 0.2 10*3/uL (ref 0.1–1.0)
MONOS PCT: 13 %
Neutro Abs: 1 10*3/uL — ABNORMAL LOW (ref 1.7–7.7)
Neutrophils Relative %: 56 %
PLATELETS: 62 10*3/uL — AB (ref 150–400)
RBC: 4.1 MIL/uL (ref 3.87–5.11)
RDW: 13.6 % (ref 11.5–15.5)
WBC: 1.8 10*3/uL — AB (ref 4.0–10.5)
nRBC: 0 % (ref 0.0–0.2)

## 2018-03-12 LAB — BASIC METABOLIC PANEL
Anion gap: 8 (ref 5–15)
BUN: 7 mg/dL (ref 6–20)
CO2: 21 mmol/L — ABNORMAL LOW (ref 22–32)
CREATININE: 0.51 mg/dL (ref 0.44–1.00)
Calcium: 9.1 mg/dL (ref 8.9–10.3)
Chloride: 107 mmol/L (ref 98–111)
GFR calc Af Amer: >60 mL/min (ref >60)
GLUCOSE: 205 mg/dL — AB (ref 70–99)
Potassium: 3.9 mmol/L (ref 3.5–5.1)
SODIUM: 136 mmol/L (ref 135–145)

## 2018-03-12 LAB — I-STAT TROPONIN, ED
TROPONIN I, POC: 0 ng/mL (ref 0.00–0.08)
Troponin i, poc: 0 ng/mL (ref 0.00–0.08)

## 2018-03-12 LAB — BRAIN NATRIURETIC PEPTIDE: B Natriuretic Peptide: 6.6 pg/mL (ref 0.0–100.0)

## 2018-03-12 LAB — D-DIMER, QUANTITATIVE (NOT AT ARMC): D DIMER QUANT: 0.71 ug{FEU}/mL — AB (ref 0.00–0.50)

## 2018-03-12 MED ORDER — SODIUM CHLORIDE 0.9 % IV BOLUS
500.0000 mL | Freq: Once | INTRAVENOUS | Status: AC
  Administered 2018-03-12: 500 mL via INTRAVENOUS

## 2018-03-12 MED ORDER — IPRATROPIUM-ALBUTEROL 0.5-2.5 (3) MG/3ML IN SOLN
3.0000 mL | Freq: Once | RESPIRATORY_TRACT | Status: AC
  Administered 2018-03-12: 3 mL via RESPIRATORY_TRACT
  Filled 2018-03-12: qty 3

## 2018-03-12 MED ORDER — IOPAMIDOL (ISOVUE-370) INJECTION 76%
INTRAVENOUS | Status: AC
  Filled 2018-03-12: qty 100

## 2018-03-12 MED ORDER — IOPAMIDOL (ISOVUE-370) INJECTION 76%
100.0000 mL | Freq: Once | INTRAVENOUS | Status: AC | PRN
  Administered 2018-03-12: 100 mL via INTRAVENOUS

## 2018-03-12 NOTE — ED Provider Notes (Signed)
  Physical Exam  BP (!) 85/42 (BP Location: Right Arm)   Pulse 73   Temp 98.8 F (37.1 C)   Resp 15   SpO2 95%   Physical Exam Previously done by initial EDP Nuala Alpha, PA-C. ED Course/Procedures   Clinical Course as of Mar 13 2043  Sat Mar 12, 2018  1607 Case discussed with Nuala Alpha, PA-C at shift change. Pending BNP and orthostatic vitals. If these are normal, patient can be discharged. If continually hypotension, plan is to admit to family medicine service for hypotension. Additional IV fluids being ordered by previous EDP.   [GM]  1619 Orthostatic vitals Lying: 93/50 Sitting: 125/80 Standing: 107/72 HR stable at 78-90 in all positions. Does not meet criteria for orthostatic hypotension.   [GM]  6387 Update prior to previous EDPs leaving. Patient had exertional chest pain when ambulating to bathroom. D-dimer and troponin added for further evaluation. Previous discharge plans as stated.   [GM]  1738 Troponin and BNP WNL. Positive d-dimer. Will order CTA for evaluation of PE.   [GM]  2004 CTA does not show any pulmonary emboli or acute pulmonary changes. Will re-check BP prior to making discharge decision.   [GM]  2044 BP prior to discharge 110/55. Patient can be safely discharged per previous conversations with EDPs who initiated care.   [GM]    Clinical Course User Index [GM] Junita Push    Procedures  MDM  52 yo female with extensive and complex medical history presented for SOB, cough and weakness. Initial labs, CXR and EKG started by previous EDP are consistent with patient's baseline. Patient has had these complaints for 2 weeks and work-up today does not reveal an acute or emergent etiology that warrants further evaluation or admission. Advised to follow-up with PCP who patient states she has an appointment with on 03/15/18.  Dispo: Home. After thorough clinical evaluation, this patient is determined to be medically stable and can be safely  discharged with the previously mentioned treatment and/or outpatient follow-up/referral(s). At this time, there are no other apparent medical conditions that require further screening, evaluation or treatment.         Junita Push 03/12/18 2045    Virgel Manifold, MD 03/12/18 2138

## 2018-03-12 NOTE — ED Notes (Signed)
Repeat BP: 86/49

## 2018-03-12 NOTE — ED Notes (Signed)
Performed Orthostatic BP's with patient in hallway bed, gave results to Dr. Ralene Bathe. After finishing ortho's, walked pt. To the bath room for sample, pt. Ambulated assist x1. Upon leaving bathroom, another tech assisted her back to just outside of bathroom door, pt. States she had chest pressure and became very Short of breath, stooping over to try to catch her breath, immediately escorted pt. Back to bed with all rails up. Placed pt. On 2L of O2, to help her breathing.  Took another EKG, gave to Dr. Ralene Bathe and communicated with her about the pt, her symptoms, etc.

## 2018-03-12 NOTE — ED Notes (Signed)
Vital signs stable. 

## 2018-03-12 NOTE — ED Provider Notes (Addendum)
[facility] EMERGENCY DEPARTMENT Provider Note   CSN: [id number] Arrival date & time: 03/12/18  1001     History   Chief Complaint Chief Complaint  Patient presents with  . Weakness    HPI Holly Mueller is a 52 y.o. female presenting for generalized weakness and cough that has been ongoing for the past month.  Patient recently seen in the emergency department for the same symptoms, given azithromycin, prednisone and benzonatate.  Patient states that she completed her course of azithromycin without improvement of symptoms.  Patient states that she began the course of prednisone however was seen by her primary care doctor 3 days later with elevated blood sugars and was told to stop taking the prednisone.  Patient states that Lavella Lemons has not improved her cough.  Patient denies history of fever, nausea/vomiting, abdominal pain, diarrhea, chest pain, rash, dysuria, hematuria or headache.  Patient states that her cough is productive with small amounts of yellow sputum.  Of note patient with history of neutropenia known by patients PCP.  HPI  Past Medical History:  Diagnosis Date  . Arthritis    bursitis left hip flares-not an issue  . Diabetes mellitus without complication ([place])   . Edentulous    10-19-13 at present  . Epilepsy ([place]) in 1972   No seizures since 1972. Previously treated with phenobarbital.   . Fever 06/2017  . GERD (gastroesophageal reflux disease) 2008  . Headache(784.0)    hx of migraines   . Hypothyroidism 2008  . Lower esophageal ring 08/18/2013  . Seasonal allergies 2003  . Shortness of breath     Patient Active Problem List   Diagnosis Date Noted  . Dyspnea 03/03/2018  . Lower respiratory tract infection 03/03/2018  . Follow-up exam   . HAP (hospital-acquired pneumonia)   . Febrile illness   . Restless leg 12/28/2017  . Fever of unknown origin 12/28/2017  . Food insecurity 10/15/2017  . Weakness 10/06/2017  . Hyperglycemia  10/06/2017  . Chest pain 09/11/2017  . Ectatic aorta ([place]) 09/11/2017  . Chest pressure 09/11/2017  . Cellulitis of left lower extremity   . Pulmonary edema 06/30/2017  . Left leg pain   . Type 2 diabetes mellitus without complication, without long-term current use of insulin ([place])   . Bibasilar crackles   . Influenza-like illness   . Neutropenic fever ([place]) 06/14/2017  . Cirrhosis of liver without ascites ([place]) 11/17/2016  . Thrombocytopenia ([place]) 11/17/2016  . Leucopenia 11/17/2016  . Bicytopenia 10/24/2016  . Epistaxis 10/23/2016  . GERD (gastroesophageal reflux disease) 10/23/2016  . Cough 12/09/2015  . Chronic pain of left thumb 10/09/2015  . Precordial chest pain 08/03/2015  . Abscess of skin of abdomen 07/10/2014  . Bursitis of left hip 09/14/2013  . Lower esophageal ring 08/18/2013  . DOE (dyspnea on exertion) 08/17/2013  . Ganglion cyst of wrist 08/24/2012  . Right hip pain 08/23/2012  . Umbilical hernia 44/07/4740  . Healthcare maintenance 02/14/2012  . Osteoarthritis 02/12/2012  . Elevated LFTs 10/30/2011  . Allergic rhinitis 08/26/2011  . Obesity 07/08/2010  . SEIZURES, HX OF 12/12/2009  . DENTAL CARIES 12/18/2008  . DEPRESSION 07/26/2008  . MIGRAINE HEADACHE 11/15/2007  . Hypothyroidism 10/12/2006  . Restrictive lung disease 07/30/2006    Past Surgical History:  Procedure Laterality Date  . BALLOON DILATION N/A 10/24/2013   Procedure: BALLOON DILATION;  Surgeon: Gatha Mayer, MD;  Location: [facility] ENDOSCOPY;  Service: Endoscopy;  Laterality: N/A;  . CESAREAN SECTION  x2  . DENTAL SURGERY     multiple extractions 3'15  . ESOPHAGOGASTRODUODENOSCOPY N/A 10/24/2013   Procedure: ESOPHAGOGASTRODUODENOSCOPY (EGD);  Surgeon: Gatha Mayer, MD;  Location: [facility];  Service: Endoscopy;  Laterality: N/A;  . LIVER BIOPSY  06/30/2012   Procedure: LIVER BIOPSY;  Surgeon: Shann Medal, MD;  Location: [facility] ORS;  Service: General;;  . NOVASURE ABLATION    .  SHOULDER ARTHROSCOPY Right 2011  . UMBILICAL HERNIA REPAIR N/A 06/30/2012   Procedure: remove umbilicus;  Surgeon: Shann Medal, MD;  Location: [facility] ORS;  Service: General;  Laterality: N/A;  . VENTRAL HERNIA REPAIR N/A 06/30/2012   Procedure: LAPAROSCOPIC VENTRAL HERNIA;  Surgeon: Shann Medal, MD;  Location: [facility] OR[facility];  Service: General;  Laterality: N/A;  With Mesh  . WISDOM TOOTH EXTRACTION       OB History   None      Home Medications    Prior to Admission medications   Medication Sig Start Date End Date Taking? Authorizing Provider  ACCU-CHEK AVIVA PLUS test strip USE UP TO 4 TIMES DAILY AS DIRECTED 11/22/17   [name], MD  ACCU-CHEK SOFTCLIX LANCETS lancets USE UP TO 4 TIMES DAILY AS DIRECTED 11/22/17   [name], MD  albuterol (PROAIR HFA) 108 (90 Base) MCG/ACT inhaler INHALE 2 PUFFS INTO THE LUNGS EVERY 6 HOURS AS NEEDED FOR SHORTNESS OF BREATH 10/23/16   Mercy Riding, MD  albuterol (PROVENTIL) (2.5 MG/3ML) 0.083% nebulizer solution Take 3 mLs (2.5 mg total) by nebulization every 6 (six) hours as needed for wheezing or shortness of breath. 10/23/16   Mercy Riding, MD  azithromycin (ZITHROMAX) 250 MG tablet Take 1 tablet (250 mg total) by mouth daily. Take first 2 tablets together, then 1 every day until finished. 02/28/18   Joy, Shawn C, PA-C  benzonatate (TESSALON) 100 MG capsule Take 1 capsule (100 mg total) by mouth every 8 (eight) hours. 02/28/18   Joy, Shawn C, PA-C  blood glucose meter kit and supplies KIT Dispense based on patient and insurance preference. Use up to four times daily as directed. (FOR ICD-9 250.00, 250.01). 06/19/17   Rory Percy, DO  cetirizine (ZYRTEC) 10 MG tablet Take 1 tablet (10 mg total) by mouth daily. 01/27/17   [name], MD  fluticasone (FLONASE) 50 MCG/ACT nasal spray Place 2 sprays into both nostrils daily.    [provider]  furosemide (LASIX) 40 MG tablet Take 1 tablet (40 mg total) by mouth daily. 03/07/18    Zenia Resides, MD  guaifenesin (HUMIBID E) 400 MG TABS tablet Take 1 tablet (400 mg total) by mouth every 4 (four) hours as needed (cough). 03/15/18   Meccariello, Bernita Raisin, DO  ibuprofen (ADVIL,MOTRIN) 200 MG tablet Take 600 mg by mouth every 6 (six) hours as needed for moderate pain.    [provider]  levothyroxine (SYNTHROID, LEVOTHROID) 125 MCG tablet Take 2 tablets (250 mcg total) by mouth daily. 01/05/18   Rory Percy, DO  metFORMIN (GLUCOPHAGE) 500 MG tablet Take 1 tablet (500 mg total) by mouth 2 (two) times daily with a meal. 03/04/18   Hensel, Jamal Collin, MD  pantoprazole (PROTONIX) 40 MG tablet TAKE 1 TABLET BY MOUTH DAILY. 07/20/17   [name], MD  promethazine-dextromethorphan (PROMETHAZINE-DM) 6.25-15 MG/5ML syrup Take 5 mLs by mouth 4 (four) times daily as needed for cough. 06/19/17   Rory Percy, DO  ursodiol (ACTIGALL) 500 MG tablet Take 1 tablet (500 mg total) by mouth 2 (  two) times daily. 03/03/18   Zenia Resides, MD    Family History Family History  Problem Relation Age of Onset  . Hypertension Mother   . Diabetes Mother   . Lung cancer Father   . Stroke Father   . Diabetes Sister   . Hypertension Sister     Social History Social History   Tobacco Use  . Smoking status: Former Smoker    Packs/day: 0.25    Types: Cigarettes    Last attempt to quit: 1997    Years since quitting: 22.8  . Smokeless tobacco: Never Used  Substance Use Topics  . Alcohol use: Yes    Comment: occ  . Drug use: No     Allergies   Aspirin   Review of Systems Review of Systems  Constitutional: Negative.  Negative for chills and fever.  HENT: Negative.  Negative for rhinorrhea and sore throat.   Eyes: Negative.  Negative for visual disturbance.  Respiratory: Positive for cough. Negative for shortness of breath.   Cardiovascular: Negative.  Negative for chest pain and leg swelling.  Gastrointestinal: Negative.  Negative for abdominal pain, blood  in stool, diarrhea, nausea and vomiting.  Genitourinary: Negative.  Negative for dysuria and hematuria.  Musculoskeletal: Negative.  Negative for arthralgias and myalgias.  Skin: Negative.  Negative for rash.  Neurological: Positive for weakness. Negative for dizziness and headaches.       Denies focal weakness   Physical Exam Updated Vital Signs BP (!) 110/55 (BP Location: Right Arm)   Pulse 73   Temp 98.8 F (37.1 C)   Resp 14   SpO2 97%   Physical Exam  Constitutional: She is oriented to person, place, and time. She appears well-developed and well-nourished. No distress.  Obese  HENT:  Head: Normocephalic and atraumatic.  Right Ear: External ear normal.  Left Ear: External ear normal.  Nose: Nose normal.  Mouth/Throat: Uvula is midline, oropharynx is clear and moist and mucous membranes are normal.  Eyes: Pupils are equal, round, and reactive to light. Conjunctivae and EOM are normal.  Neck: Trachea normal, normal range of motion, full passive range of motion without pain and phonation normal. Neck supple. No tracheal deviation present.  Cardiovascular: Normal rate, regular rhythm, normal heart sounds and intact distal pulses.  Pulses:      Radial pulses are 2+ on the right side, and 2+ on the left side.       Dorsalis pedis pulses are 2+ on the right side, and 2+ on the left side.       Posterior tibial pulses are 2+ on the right side, and 2+ on the left side.  Pulmonary/Chest: Effort normal. No accessory muscle usage. No respiratory distress. She has wheezes.  Patient with mild diffuse wheezes present.  Abdominal: Soft. There is no tenderness. There is no rigidity, no rebound, no guarding and no CVA tenderness.  Musculoskeletal: Normal range of motion.       Right lower leg: Normal.       Left lower leg: Normal.  Feet:  Right Foot:  Protective Sensation: 3 sites tested. 3 sites sensed.  Left Foot:  Protective Sensation: 3 sites tested. 3 sites sensed.  Neurological:  She is alert and oriented to person, place, and time.  Mental Status: Alert, oriented, thought content appropriate, able to give a coherent history. Speech fluent without evidence of aphasia, patient with slight lisp (states this is her usual). Able to follow 2 step commands without difficulty. Cranial Nerves:  II: Peripheral visual fields grossly normal, pupils equal, round, reactive to light III,IV, VI: ptosis not present, extra-ocular motions intact bilaterally V,VII: smile symmetric, eyebrows raise symmetric, facial light touch sensation equal VIII: hearing grossly normal to voice X: uvula elevates symmetrically XI: bilateral shoulder shrug symmetric and strong XII: midline tongue extension without fassiculations Motor: Normal tone. 5/5 strength in upper and lower extremities bilaterally including strong and equal grip strength and dorsiflexion/plantar flexion Sensory: Sensation intact to light touch in all extremities.Negative Romberg.  Cerebellar: normal finger-to-nose with bilateral upper extremities. Normal heel-to -shin balance bilaterally of the lower extremity. No pronator drift.  Gait: normal gait and balance CV: distal pulses palpable throughout  Skin: Skin is warm and dry. Capillary refill takes less than 2 seconds.  Psychiatric: She has a normal mood and affect. Her behavior is normal.    ED Treatments / Results  Labs (all labs ordered are listed, but only abnormal results are displayed) Labs Reviewed  BASIC METABOLIC PANEL - Abnormal; Notable for the following components:      Result Value   CO2 21 (*)    Glucose, Bld 205 (*)    All other components within normal limits  CBC WITH DIFFERENTIAL/PLATELET - Abnormal; Notable for the following components:   WBC 1.8 (*)    Hemoglobin 11.3 (*)    HCT 35.5 (*)    Platelets 62 (*)    Neutro Abs 1.0 (*)    Lymphs Abs 0.4 (*)    All other components within normal limits  D-DIMER, QUANTITATIVE (NOT AT [facility]) -  Abnormal; Notable for the following components:   D-Dimer, Quant 0.71 (*)    All other components within normal limits  BRAIN NATRIURETIC PEPTIDE  I-STAT TROPONIN, ED  I-STAT TROPONIN, ED    EKG EKG Interpretation  Date/Time:  Saturday March 12 2018 16:22:58 EDT Ventricular Rate:  76 PR Interval:  150 QRS Duration: 86 QT Interval:  394 QTC Calculation: 443 R Axis:   10 Text Interpretation:  Normal sinus rhythm Normal ECG No significant change since last tracing Confirmed by Theotis Burrow [phone]) on 03/13/2018 5:07:15 PM   Radiology No results found.  Procedures Procedures (including critical care time)  Medications Ordered in ED Medications  sodium chloride 0.9 % bolus 500 mL (0 mLs Intravenous Stopped 03/12/18 1339)  ipratropium-albuterol (DUONEB) 0.5-2.5 (3) MG/3ML nebulizer solution 3 mL (3 mLs Nebulization Given 03/12/18 1643)  sodium chloride 0.9 % bolus 500 mL (0 mLs Intravenous Stopped 03/12/18 2018)  iopamidol (ISOVUE-370) 76 % injection 100 mL (100 mLs Intravenous Contrast Given 03/12/18 1910)     Initial Impression / Assessment and Plan / ED Course  I have reviewed the triage vital signs and the nursing notes.  Pertinent labs & imaging results that were available during my care of the patient were reviewed by me and considered in my medical decision making (see chart for details).  Clinical Course as of Mar 16 1522  Sat Mar 12, 2018  1607 Case discussed with Nuala Alpha, PA-C at shift change. Pending BNP and orthostatic vitals. If these are normal, patient can be discharged. If continually hypotension, plan is to admit to family medicine service for hypotension. Additional IV fluids being ordered by previous EDP.   [GM]  1619 Orthostatic vitals Lying: 93/50 Sitting: 125/80 Standing: 107/72 HR stable at 78-90 in all positions. Does not meet criteria for orthostatic hypotension.   [GM]  1941 Update prior to previous EDPs leaving. Patient had exertional  chest pain when ambulating  to bathroom. D-dimer and troponin added for further evaluation. Previous discharge plans as stated.   [GM]  1738 Troponin and BNP WNL. Positive d-dimer. Will order CTA for evaluation of PE.   [GM]  2004 CTA does not show any pulmonary emboli or acute pulmonary changes. Will re-check BP prior to making discharge decision.   [GM]  2044 BP prior to discharge 110/55. Patient can be safely discharged per previous conversations with EDPs who initiated care.   [GM]    Clinical Course User Index [GM] Romie Jumper, [place]   52 year old patient presenting for 2-3 weeks of generalized weakness and cough.  CBC noted to be neutropenic- known and followed by family medicine for this Chest x-ray was nonacute EKG without acute findings reviewed by Dr. Ralene Bathe BMP nonacute Tropoinin negative Patient given 500 mL fluid bolus and albuterol neb ordered for mild wheezing  Patient seen and evaluated by Dr. Ralene Bathe who agrees with work-up.  Patient to the emergency department for multiple hours no acute distress.  There was a delay before albuterol was started, vital signs updated at 4 PM showed hypotension.  Rediscussed with Dr. Ralene Bathe, BNP ordered, second fluid bolus ordered, orthostatic vital signs ordered.  Patient handoff given to [facility] PA-C at shift change.  Plan at time of handoff is to await BNP, give second fluid bolus, give albuterol, reassess lung sounds and orthostatic vital signs and disposition appropriately.  Patient disposition to be completed by oncoming team.  Final Clinical Impressions(s) / ED Diagnoses   Final diagnoses:  Weakness    ED Discharge Orders    None       Deliah Boston, PA-C 03/12/18 1623    [address], PA-C 03/16/18 1523    Quintella Reichert, MD 03/21/18 [phone]

## 2018-03-12 NOTE — ED Notes (Signed)
Patient transported to CT 

## 2018-03-12 NOTE — ED Notes (Signed)
Patient verbalizes understanding of discharge instructions. Opportunity for questioning and answers were provided. Armband removed by staff, pt discharged from ED

## 2018-03-12 NOTE — Discharge Instructions (Signed)
Your evaluation today did not show [place] a clear reason why you are feeling this way. Please be sure to discuss this with your PCP at your appointment on Nov. 5th.  Thank you for allowing [place] to take care of you today.

## 2018-03-12 NOTE — ED Triage Notes (Addendum)
Pt was seen here 10/21 for cough with shortness of breath and weakness. She completed her antibiotic course but stopped her prednisone because it was raising her blood sugars. Pt reports no improvement in symptoms. She still endorses shortness of breath and productive cough with weakness.

## 2018-03-15 ENCOUNTER — Ambulatory Visit (INDEPENDENT_AMBULATORY_CARE_PROVIDER_SITE_OTHER): Payer: Self-pay | Admitting: Family Medicine

## 2018-03-15 ENCOUNTER — Encounter: Payer: Self-pay | Admitting: Family Medicine

## 2018-03-15 VITALS — BP 110/80 | HR 89 | Temp 98.6°F | Wt 239.4 lb

## 2018-03-15 DIAGNOSIS — R059 Cough, unspecified: Secondary | ICD-10-CM

## 2018-03-15 DIAGNOSIS — R05 Cough: Secondary | ICD-10-CM

## 2018-03-15 DIAGNOSIS — J984 Other disorders of lung: Secondary | ICD-10-CM

## 2018-03-15 MED ORDER — GUAIFENESIN 400 MG PO TABS: 400.0000 mg | ORAL_TABLET | ORAL | 1 refills | Status: DC | PRN

## 2018-03-15 NOTE — Assessment & Plan Note (Addendum)
PFTs and prior pulmonology notes reviewed.  Pulmonology believes patient's restrictive lung disease likely secondary to an underlying connective tissue disorder.  They have referred her to rheumatology the patient has not been seen due to lack of insurance or problems with referral.  She has no infectious symptoms, has not improved with albuterol, steroids, azithromycin, Lasix.  She has gotten a recent extensive work-up in the ED with all images and labs reviewed.  She has no evidence of pulmonary edema on CXR on 11/2, no pulmonary embolism on CTA, no evidence of ACS given chronicity and normal EKG and troponins, and no evidence of infection with lack of fever and acute symptoms.  Shortness of breath also seems to be very chronic and has been occurring off and on over many years.  She has crackles on exam, but suspect this is secondary to her chronic lung disease as she is not fluid overloaded on exam and recent chest x-ray was negative.  Given the patient has no insurance, she will need to continue to work with Kennyth Lose for assistance with obtaining Medicaid and [employer]. -Follow-up with pulmonology for treatment of restrictive lung disease -Make an appointment with Kennyth Lose for assistance with Medicaid and orange card -Follow-up with Dr. Lindell Noe for continued care coordination

## 2018-03-15 NOTE — Assessment & Plan Note (Signed)
Chronic, not improving with Tessalon Perles.  Suspect this is secondary to her chronic restrictive lung disease.  Has not improved with albuterol, azithromycin, steroids.  She has not reported any fevers, cough is mostly dry and has lasted over a year.  Do not believe this is infectious in etiology. -Guaifenesin 400 mg every 4 hours as needed -Continue albuterol as needed -follow-up with pulmonology

## 2018-03-15 NOTE — Patient Instructions (Addendum)
I believe that your cough and trouble breathing are likely from an underlying lung problem for which your pulmonologist had wanted you to see a rheumatologist for.  I have sent you a prescription for a cough medicine.  Call your Pulmonologist to schedule an appointment.  I would like for you to be seen sometime in the next few weeks.  Schedule an appointment with Kennyth Lose when you leave about your [facility] Card/Medicaid.   You should come back within the next month to see Dr. Lindell Noe.  Please make an appointment with her before you leave.  If you develop a fever, have chest pain, or significant trouble breathing call the office or go to the ED.  It was great to meet you, hope you start feeling better.  Best, Dr. Jerilynn Mages

## 2018-03-15 NOTE — Progress Notes (Signed)
Subjective: Chief Complaint  Patient presents with  . Shortness of Breath     HPI: Holly Mueller is a 52 y.o. presenting to clinic today to discuss the following:  Shortness of breath/cough Patient says that she has been experiencing increased fatigue and shortness of breath for the past 3 to 4 weeks.  She also notes a mostly dry cough that is rarely productive with green/yellow sputum.  She states that this is actually been a chronic issue which comes and goes every few months throughout the past few years.  She has been to the ED multiple times recently for this complaint.  She notes that she was prescribed prednisone which she took took a few times before it was discontinued by Dr. Andria Frames due to hyperglycemia.  She states that she completed a course of azithromycin without improvement.  She has not had any reported fevers, vomiting, diarrhea, sore throat with the symptoms.  She states that she occasionally gets a "needle poking" sensation in her chest that radiates to her back that happens randomly throughout the day.  She states "it feels like I am having an allergic reaction."  In the ED she has had a CTA to rule out pulmonary embolism which was negative as well as negative troponins and a negative EKG.  Dr. Andria Frames had also started the patient on Lasix 40 mg daily for which she has had good urine output, but no improvement in her symptoms.  That she has an albuterol inhaler that she uses at home as needed, but does not improve her breathing.  She also states that she has been using [employer] without improvement in her cough.       ROS noted in HPI.   Past Medical, Surgical, Social, and Family History Reviewed & Updated per EMR.   Pertinent Historical Findings include:   Social History   Tobacco Use  Smoking Status Former Smoker  . Packs/day: 0.25  . Types: Cigarettes  . Last attempt to quit: 1997  . Years since quitting: 22.8  Smokeless Tobacco Never Used       Objective: BP 110/80   Pulse 89   Temp 98.6 F (37 C) (Oral)   Wt 239 lb 6.4 oz (108.6 kg)   SpO2 95%   BMI 38.64 kg/m  Vitals and nursing notes reviewed  Physical Exam: General: 52 y.o. female in NAD HEENT: NCAT, no cervical LAD, MMM, oropharynx clear Cardio: RRR no M/R/G Lungs: No increased work of breathing, crackles in bilateral mid to lower lungs Skin: warm and dry Extremities: Trace BLE edema    Results for orders placed or performed during the hospital encounter of 03/12/18 (from the past 72 hour(s))  D-dimer, quantitative     Status: Abnormal   Collection Time: 03/12/18  4:55 PM  Result Value Ref Range   D-Dimer, Quant 0.71 (H) 0.00 - 0.50 ug/mL-FEU    Comment: (NOTE) At the manufacturer cut-off of 0.50 ug/mL FEU, this assay has been documented to exclude PE with a sensitivity and negative predictive value of 97 to 99%.  At this time, this assay has not been approved by the FDA to exclude DVT/VTE. Results should be correlated with clinical presentation. Performed at [facility], [place] [address] [place], [place] [address]   I-stat troponin, ED     Status: None   Collection Time: 03/12/18  4:57 PM  Result Value Ref Range   Troponin i, poc 0.00 0.00 - 0.08 ng/mL  Comment 3            Comment: Due to the release kinetics of cTnI, a negative result within the first hours of the onset of symptoms does not rule out myocardial infarction with certainty. If myocardial infarction is still suspected, repeat the test at appropriate intervals.     Assessment/Plan:  Cough Chronic, not improving with Tessalon Perles.  Suspect this is secondary to her chronic restrictive lung disease.  Has not improved with albuterol, azithromycin, steroids.  She has not reported any fevers, cough is mostly dry and has lasted over a year.  Do not believe this is infectious in etiology. -Guaifenesin 400 mg every 4 hours as needed -Continue albuterol as needed -follow-up  with pulmonology  Restrictive lung disease PFTs and prior pulmonology notes reviewed.  Pulmonology believes patient's restrictive lung disease likely secondary to an underlying connective tissue disorder.  They have referred her to rheumatology the patient has not been seen due to lack of insurance or problems with referral.  She has no infectious symptoms, has not improved with albuterol, steroids, azithromycin, Lasix.  She has gotten a recent extensive work-up in the ED with all images and labs reviewed.  She has no evidence of pulmonary edema on CXR on 11/2, no pulmonary embolism on CTA, no evidence of ACS given chronicity and normal EKG and troponins, and no evidence of infection with lack of fever and acute symptoms.  Shortness of breath also seems to be very chronic and has been occurring off and on over many years.  She has crackles on exam, but suspect this is secondary to her chronic lung disease as she is not fluid overloaded on exam and recent chest x-ray was negative.  Given the patient has no insurance, she will need to continue to work with Kennyth Lose for assistance with obtaining Medicaid and [employer]. -Follow-up with pulmonology for treatment of restrictive lung disease -Make an appointment with Kennyth Lose for assistance with Medicaid and orange card -Follow-up with Dr. Lindell Noe for continued care coordination     PATIENT EDUCATION PROVIDED: See AVS    Diagnosis and plan along with any newly prescribed medication(s) were discussed in detail with this patient today. The patient verbalized understanding and agreed with the plan. Patient advised if symptoms worsen return to clinic or ER.   Health Maintainance: Recommend that patient receive flu vaccination at health department as she has no insurance   No orders of the defined types were placed in this encounter.   Meds ordered this encounter  Medications  . guaifenesin (HUMIBID E) 400 MG TABS tablet    Sig: Take 1 tablet (400 mg  total) by mouth every 4 (four) hours as needed (cough).    Dispense:  30 tablet    Refill:  [place], DO 03/15/2018, 2:46 PM PGY-1 [place]

## 2018-03-16 ENCOUNTER — Ambulatory Visit (HOSPITAL_BASED_OUTPATIENT_CLINIC_OR_DEPARTMENT_OTHER): Payer: Medicaid Other | Admitting: Critical Care Medicine

## 2018-03-16 DIAGNOSIS — J849 Interstitial pulmonary disease, unspecified: Secondary | ICD-10-CM

## 2018-03-18 NOTE — Progress Notes (Signed)
Patient ID: Holly Mueller, female   DOB: [birth year], 52 y.o.   MRN: [id number] The patient was a no show for this visit, was rescheduled

## 2018-03-22 ENCOUNTER — Ambulatory Visit: Payer: Self-pay | Attending: Critical Care Medicine | Admitting: Critical Care Medicine

## 2018-03-22 ENCOUNTER — Telehealth: Payer: Self-pay | Admitting: Critical Care Medicine

## 2018-03-22 ENCOUNTER — Encounter: Payer: Self-pay | Admitting: Critical Care Medicine

## 2018-03-22 VITALS — BP 115/81 | HR 76 | Temp 98.5°F | Resp 20 | Ht 66.0 in | Wt 240.8 lb

## 2018-03-22 DIAGNOSIS — R188 Other ascites: Secondary | ICD-10-CM | POA: Insufficient documentation

## 2018-03-22 DIAGNOSIS — J984 Other disorders of lung: Secondary | ICD-10-CM | POA: Insufficient documentation

## 2018-03-22 DIAGNOSIS — Z886 Allergy status to analgesic agent status: Secondary | ICD-10-CM | POA: Insufficient documentation

## 2018-03-22 DIAGNOSIS — E119 Type 2 diabetes mellitus without complications: Secondary | ICD-10-CM | POA: Insufficient documentation

## 2018-03-22 DIAGNOSIS — R0609 Other forms of dyspnea: Secondary | ICD-10-CM | POA: Insufficient documentation

## 2018-03-22 DIAGNOSIS — R05 Cough: Secondary | ICD-10-CM | POA: Insufficient documentation

## 2018-03-22 DIAGNOSIS — K7460 Unspecified cirrhosis of liver: Secondary | ICD-10-CM | POA: Insufficient documentation

## 2018-03-22 DIAGNOSIS — D72819 Decreased white blood cell count, unspecified: Secondary | ICD-10-CM | POA: Insufficient documentation

## 2018-03-22 DIAGNOSIS — D696 Thrombocytopenia, unspecified: Secondary | ICD-10-CM | POA: Insufficient documentation

## 2018-03-22 DIAGNOSIS — Z87891 Personal history of nicotine dependence: Secondary | ICD-10-CM | POA: Insufficient documentation

## 2018-03-22 DIAGNOSIS — R059 Cough, unspecified: Secondary | ICD-10-CM

## 2018-03-22 DIAGNOSIS — Z7984 Long term (current) use of oral hypoglycemic drugs: Secondary | ICD-10-CM | POA: Insufficient documentation

## 2018-03-22 DIAGNOSIS — Z79890 Hormone replacement therapy (postmenopausal): Secondary | ICD-10-CM | POA: Insufficient documentation

## 2018-03-22 DIAGNOSIS — Z79899 Other long term (current) drug therapy: Secondary | ICD-10-CM | POA: Insufficient documentation

## 2018-03-22 DIAGNOSIS — E039 Hypothyroidism, unspecified: Secondary | ICD-10-CM | POA: Insufficient documentation

## 2018-03-22 DIAGNOSIS — J8410 Pulmonary fibrosis, unspecified: Secondary | ICD-10-CM | POA: Insufficient documentation

## 2018-03-22 DIAGNOSIS — K219 Gastro-esophageal reflux disease without esophagitis: Secondary | ICD-10-CM | POA: Insufficient documentation

## 2018-03-22 DIAGNOSIS — Z833 Family history of diabetes mellitus: Secondary | ICD-10-CM | POA: Insufficient documentation

## 2018-03-22 DIAGNOSIS — J849 Interstitial pulmonary disease, unspecified: Secondary | ICD-10-CM

## 2018-03-22 DIAGNOSIS — D709 Neutropenia, unspecified: Secondary | ICD-10-CM

## 2018-03-22 MED ORDER — PANTOPRAZOLE SODIUM 40 MG PO TBEC: 40.0000 mg | DELAYED_RELEASE_TABLET | Freq: Two times a day (BID) | ORAL | 3 refills | Status: DC

## 2018-03-22 MED ORDER — DEXTROMETHORPHAN POLISTIREX ER 30 MG/5ML PO SUER: 30.0000 mg | Freq: Two times a day (BID) | ORAL | 0 refills | Status: DC | PRN

## 2018-03-22 MED FILL — PANTOPRAZOLE SOD DR 40 MG T: 40 | 30 days supply | Qty: 60 | Fill #0

## 2018-03-22 NOTE — Telephone Encounter (Signed)
[name]  Can you see this patient with IPF as a second opinion.  She has no insurance.

## 2018-03-22 NOTE — Progress Notes (Signed)
Subjective:    Patient ID: Holly Mueller, female    DOB: 08-Apr-1966, 52 y.o.   MRN: [id number]  52 y.o.F with pulm fibrosis , symptoms for 2 years.  felt to be d/t autoimmune disease, not able to f/u with Rheumatology.  Recently in ED 03/12/18 Pt prev in ED 02/28/18 and Rx azithromycin prednisone.  Did not finish pred d/t hyperglycemia Also with chronic leukopenia d/t chronic liver disease per hematology. Admitted from 2/4-06/19/17 with fevers, diagnosed with flu.  Treated with Tamiflu.  She was seen by GI at that time for evaluation of cirrhosis and recommended outpatient follow-up with Dr. Carlean Purl. Hospitalized again from 2/19-2/24/19 with cellulitis, lower extremity pain.  Workup was negative for PE and DVT.  She was treated with Keflex.  CT showed stable mild groundglass opacities consistent with volume overload.    She is in being followed by Dr. Lebron Conners, hematology for cytopenia status post bone marrow biopsy.  Her low blood counts are felt to be secondary to cirrhosis.   Hospitalized again and early May for chest pain, dyspnea.  She had a stress test which is normal. Also seen by pulmonary for CTA that was negative for PE but showed some chronic lung opacities. Evaluated for aspiration.  Swallow eval was normal Serologies shows positive ANA, SSA  Since then multiple ED visits   One admission as well 12/2017 with fever and neutropenia, seen by oncology and GI and neutropenia felt to d/t  cirrhosis.      Needs Rheum eval and so far not able to establish d/t insurance  Weakness, cough, dyspnea persists.    Shortness of Breath  This is a chronic problem. The current episode started more than 1 year ago. The problem occurs daily (good and bad days.  dyspnea if leans back or sitting still). The problem has been unchanged (cannot walk short distance to mailbox). Associated symptoms include chest pain, leg swelling, orthopnea, sputum production and wheezing. Pertinent negatives include no  hemoptysis, PND or vomiting. The symptoms are aggravated by smoke. Associated symptoms comments: Cough , is dry.  occ productive 4 weeks ago. . She has tried beta agonist inhalers, oral steroids and steroid inhalers for the symptoms. The treatment provided no relief. Her past medical history is significant for pneumonia. There is no history of asthma. (Flu 06/2017.  Leukopenia .  PNA 06/2017)    Past Medical History:  Diagnosis Date  . Arthritis    bursitis left hip flares-not an issue  . Diabetes mellitus without complication ([place])   . Edentulous    10-19-13 at present  . Epilepsy ([place]) in 1972   No seizures since 1972. Previously treated with phenobarbital.   . Fever 06/2017  . GERD (gastroesophageal reflux disease) 2008  . Headache(784.0)    hx of migraines   . Hypothyroidism 2008  . Lower esophageal ring 08/18/2013  . Seasonal allergies 2003  . Shortness of breath      Family History  Problem Relation Age of Onset  . Hypertension Mother   . Diabetes Mother   . Lung cancer Father   . Stroke Father   . Diabetes Sister   . Hypertension Sister      Social History   Socioeconomic History  . Marital status: Single    Spouse name: Not on file  . Number of children: 2  . Years of education: Not on file  . Highest education level: Not on file  Occupational History    Employer: [employer]  Social Needs  . Financial resource strain: Somewhat hard  . Food insecurity:    Worry: Sometimes true    Inability: Sometimes true  . Transportation needs:    Medical: Yes    Non-medical: Yes  Tobacco Use  . Smoking status: Former Smoker    Packs/day: 0.25    Types: Cigarettes    Last attempt to quit: 1997    Years since quitting: 22.8  . Smokeless tobacco: Never Used  . Tobacco comment: social  Substance and Sexual Activity  . Alcohol use: Yes    Comment: occ  . Drug use: No  . Sexual activity: Not Currently  Lifestyle  . Physical activity:    Days per week: 3 days     Minutes per session: 40 min  . Stress: Rather much  Relationships  . Social connections:    Talks on phone: More than three times a week    Gets together: More than three times a week    Attends religious service: Never    Active member of club or organization: No    Attends meetings of clubs or organizations: Never    Relationship status: Never married  . Intimate partner violence:    Fear of current or ex partner: Patient refused    Emotionally abused: Patient refused    Physically abused: Patient refused    Forced sexual activity: Patient refused  Other Topics Concern  . Not on file  Social History Narrative   Works in Estate manager/land agent at [employer]    Lives with 12 and 14 (boys).    Also living with close friends of the family (mother and two kids, adults).            Allergies  Allergen Reactions  . Aspirin Nausea Only    Upset stomach     Outpatient Medications Prior to Visit  Medication Sig Dispense Refill  . ACCU-CHEK AVIVA PLUS test strip USE UP TO 4 TIMES DAILY AS DIRECTED 100 each 0  . ACCU-CHEK SOFTCLIX LANCETS lancets USE UP TO 4 TIMES DAILY AS DIRECTED 100 each 0  . albuterol (PROAIR HFA) 108 (90 Base) MCG/ACT inhaler INHALE 2 PUFFS INTO THE LUNGS EVERY 6 HOURS AS NEEDED FOR SHORTNESS OF BREATH 8.5 g 5  . albuterol (PROVENTIL) (2.5 MG/3ML) 0.083% nebulizer solution Take 3 mLs (2.5 mg total) by nebulization every 6 (six) hours as needed for wheezing or shortness of breath. 75 mL 2  . blood glucose meter kit and supplies KIT Dispense based on patient and insurance preference. Use up to four times daily as directed. (FOR ICD-9 250.00, 250.01). 1 each 0  . cetirizine (ZYRTEC) 10 MG tablet Take 1 tablet (10 mg total) by mouth daily. 30 tablet 11  . fluticasone (FLONASE) 50 MCG/ACT nasal spray Place 2 sprays into both nostrils daily.    . furosemide (LASIX) 40 MG tablet Take 1 tablet (40 mg total) by mouth daily. 30 tablet 3  . guaifenesin (HUMIBID E) 400 MG TABS  tablet Take 1 tablet (400 mg total) by mouth every 4 (four) hours as needed (cough). 30 tablet 1  . ibuprofen (ADVIL,MOTRIN) 200 MG tablet Take 600 mg by mouth every 6 (six) hours as needed for moderate pain.    Marland Kitchen levothyroxine (SYNTHROID, LEVOTHROID) 125 MCG tablet Take 2 tablets (250 mcg total) by mouth daily. 60 tablet 0  . metFORMIN (GLUCOPHAGE) 500 MG tablet Take 1 tablet (500 mg total) by mouth 2 (two) times daily with a meal. 180  tablet 2  . benzonatate (TESSALON) 100 MG capsule Take 1 capsule (100 mg total) by mouth every 8 (eight) hours. 21 capsule 0  . pantoprazole (PROTONIX) 40 MG tablet TAKE 1 TABLET BY MOUTH DAILY. 30 tablet 3  . ursodiol (ACTIGALL) 500 MG tablet Take 1 tablet (500 mg total) by mouth 2 (two) times daily. (Patient not taking: Reported on 03/22/2018) 180 tablet 3  . azithromycin (ZITHROMAX) 250 MG tablet Take 1 tablet (250 mg total) by mouth daily. Take first 2 tablets together, then 1 every day until finished. (Patient not taking: Reported on 03/22/2018) 6 tablet 0  . promethazine-dextromethorphan (PROMETHAZINE-DM) 6.25-15 MG/5ML syrup Take 5 mLs by mouth 4 (four) times daily as needed for cough. (Patient not taking: Reported on 03/22/2018) 120 mL 0   No facility-administered medications prior to visit.      Review of Systems  Respiratory: Positive for sputum production, shortness of breath and wheezing. Negative for hemoptysis.   Cardiovascular: Positive for chest pain, orthopnea and leg swelling. Negative for PND.  Gastrointestinal: Negative for abdominal distention, anal bleeding, blood in stool and vomiting.       Hx GERD,  Has gas in chest and builds up, notes burning sensation,  Pin like sensation and down the back  No improvement with prednisone Hx of asthma but this was disproved, inhalers of no use     Objective:   Physical Exam Vitals:   03/22/18 1528  BP: 115/81  Pulse: 76  Resp: 20  Temp: 98.5 F (36.9 C)  TempSrc: Oral  SpO2: 96%  Weight:  240 lb 12.8 oz (109.2 kg)  Height: _0  (1.676 m)    Gen: Pleasant, obese, in no distress,  normal affect  ENT: No lesions,  mouth clear,  oropharynx clear, no postnasal drip  Neck: No JVD, no TMG, no carotid bruits  Lungs: No use of accessory muscles, no dullness to percussion, bilateral dry rales two thirds of the way up  Cardiovascular: RRR, heart sounds normal, no murmur or gallops, no peripheral edema  Abdomen: soft and NT, no HSM,  BS normal  Musculoskeletal: No deformities, no cyanosis or clubbing  Neuro: alert, non focal  Skin: Warm, no lesions or rashes  No results found.  Chest x-ray 04/05/15- bibasilar opacities. Chest x-ray 11/16/15-left basal scarring.  CTA 11/23/16- no pulmonary embolism, no interstitial lung disease, cirrhosis, abdominal adenopathy. CTA 06/30/2017-no PE faint groundglass opacities, hepatic cirrhosis CTA 09/11/2017-no PE, diffuse groundglass opacity, cirrhosis. All images reviewed.  FENO 01/07/17- 6 FENO 07/06/17 <5  PFTs 11/13/16 FVC 1.84 (49%), FEV1 1.52 (51%), F/F 82, TLC 44% Unable to complete DLCO Severe restriction. No obstruction  CBC 12/02/16- WBC 1.4, platelets 66, eosinophil 4%, absolute eosinophil count 100 Blood allergy profile 03/06/16-negative, IgE less than 2  Lab Results  Component Value Date   WBC 1.8 (L) 03/12/2018   HGB 11.3 (L) 03/12/2018   HCT 35.5 (L) 03/12/2018   MCV 86.6 03/12/2018   PLT 62 (L) 03/12/2018   BMP Latest Ref Rng & Units 03/12/2018 03/03/2018 01/12/2018  Glucose 70 - 99 mg/dL 205(H) 143(H) 120(H)  BUN 6 - 20 mg/dL _1 Creatinine 0.44 - 1.00 mg/dL 0.51 0.66 0.69  BUN/Creat Ratio 9 - 23 - 11 16  Sodium 135 - 145 mmol/L 136 139 141  Potassium 3.5 - 5.1 mmol/L 3.9 3.7 4.3  Chloride 98 - 111 mmol/L 107 107(H) 108(H)  CO2 22 - 32 mmol/L 21(L) 18(L) 21  Calcium 8.9 - 10.3 mg/dL  9.1 8.7 9.1   Lab Results  Component Value Date   ALT 19 03/03/2018   AST 33 03/03/2018   ALKPHOS 127 (H) 03/03/2018    BILITOT 1.3 (H) 03/03/2018  09/2017: ANA Pos speckled pattern  IgE neg.  dsDNAab 2  CCP <16  03/12/18 CT Angio Chest  IMPRESSION: Mild atelectatic changes without focal confluent infiltrate.  No evidence of pulmonary emboli.  Hepatic cirrhosis with splenomegaly.  Ambulatory saturations on room air were normal with saturation falling not less than 92%      Assessment & Plan:  I personally reviewed all images and lab data in the [facility] system as well as any outside material available during this office visit and agree with the  radiology impressions.   Leucopenia Chronic leukopenia felt to be secondary to cirrhotic liver disease Plan 10 you to observation  Thrombocytopenia (HCC) Chronic thrombocytopenia felt to be due to cirrhotic liver disease Plan Per hematology  Cough Chronic cough secondary to interstitial lung disease and high level reflux disease Plan Prescribe Delsym as a cough suppressant twice daily as needed No further corticosteroids as this is been ineffective in the past Increase proton pump inhibitor to twice daily with meals A reflux diet instruction was given to the patient  DOE (dyspnea on exertion) Dyspnea on exertion is secondary to the patient's interstitial lung disease Note the patient did not desaturate with exertion on this office visit  Cirrhosis of liver without ascites ([place]) Cirrhotic liver disease with ascites Per gastroenterology and primary care  GERD (gastroesophageal reflux disease) Gastroesophageal reflux disease is contributing to the patient's cough and I am concerned may be a genesis of the patient's lower lobe pulmonary fibrosis More aggressive control of reflux is indicated Plan Increase proton pump inhibitor to twice daily Reflux diet was reviewed with the patient  ILD (interstitial lung disease) ([place]) Bilateral interstitial lung disease with restrictive defect on pulmonary function studies Currently no evidence of autoimmune  process with negative autoimmune serology Patient's been unable to complete a rheumatology evaluation due to insurance barrier Plan I would like for the patient to see as a second opinion Dr. Brand Males of pulmonary medicine whose focus has been interstitial lung disease  Corticosteroids do not appear to be of benefit in this situation and have the patient has not responded  Inhaled medications also been of no benefit  I would be reluctant to prescribe either Imuran or CellCept in this patient who already has leukopenia and thrombocytopenia chronically  The patient's chronic liver disease will be a barrier to any specific therapy for the interstitial lung disease  Strict control of the reflux disease is imperative in this situation  A referral to Dr. Chase Caller was made at this visit   Restrictive lung disease Strict of lung disease on the basis of interstitial lung disease etiology not clear Biopsy has not yet been obtained  Review interstitial lung disease assessment   Diagnoses and all orders for this visit:  ILD (interstitial lung disease) ([place])  Gastroesophageal reflux disease, esophagitis presence not specified -     pantoprazole (PROTONIX) 40 MG tablet; Take 1 tablet (40 mg total) by mouth 2 (two) times daily before a meal.  Neutropenia, unspecified type (HCC)  Thrombocytopenia (HCC)  Cough  DOE (dyspnea on exertion)  Cirrhosis of liver without ascites, unspecified hepatic cirrhosis type (HCC)  Restrictive lung disease  Other orders -     dextromethorphan (DELSYM) 30 MG/5ML liquid; Take 5 mLs (30 mg total) by mouth 2 (two)  times daily as needed for cough.

## 2018-03-22 NOTE — Patient Instructions (Addendum)
Increase protonix to one twice daily with meals Use Delsym 15ML (39m) twice daily to help with cough No more prednisone You can use albuterol as needed You do not need any oxygen I have asked my partner DR MBrand Males, an expert in pulmonary fibrosis to see you as a second opinion, he will see you without any insurance. He will call you Follow a strict reflux diet. See below:   Food Choices for Gastroesophageal Reflux Disease, Adult When you have gastroesophageal reflux disease (GERD), the foods you eat and your eating habits are very important. Choosing the right foods can help ease your discomfort. What guidelines do I need to follow?  Choose fruits, vegetables, whole grains, and low-fat dairy products.  Choose low-fat meat, fish, and poultry.  Limit fats such as oils, salad dressings, butter, nuts, and avocado.  Keep a food diary. This helps you identify foods that cause symptoms.  Avoid foods that cause symptoms. These may be different for everyone.  Eat small meals often instead of 3 large meals a day.  Eat your meals slowly, in a place where you are relaxed.  Limit fried foods.  Cook foods using methods other than frying.  Avoid drinking alcohol.  Avoid drinking large amounts of liquids with your meals.  Avoid bending over or lying down until 2-3 hours after eating. What foods are not recommended? These are some foods and drinks that may make your symptoms worse: Vegetables Tomatoes. Tomato juice. Tomato and spaghetti sauce. Chili peppers. Onion and garlic. Horseradish. Fruits Oranges, grapefruit, and lemon (fruit and juice). Meats High-fat meats, fish, and poultry. This includes hot dogs, ribs, ham, sausage, salami, and bacon. Dairy Whole milk and chocolate milk. Sour cream. Cream. Butter. Ice cream. Cream cheese. Drinks Coffee and tea. Bubbly (carbonated) drinks or energy drinks. Condiments Hot sauce. Barbecue sauce. Sweets/Desserts Chocolate and  cocoa. Donuts. Peppermint and spearmint. Fats and Oils High-fat foods. This includes [place] and potato chips. Other Vinegar. Strong spices. This includes black pepper, white pepper, red pepper, cayenne, curry powder, cloves, ginger, and chili powder. The items listed above may not be a complete list of foods and drinks to avoid. Contact your dietitian for more information. This information is not intended to replace advice given to you by your health care provider. Make sure you discuss any questions you have with your health care provider. Document Released: 10/27/2011 Document Revised: 10/03/2015 Document Reviewed: 03/01/2013 Elsevier Interactive Patient Education  2017 [employer]

## 2018-03-22 NOTE — Progress Notes (Signed)
Follow up with on possible lung disease with pulmonologist

## 2018-03-23 DIAGNOSIS — J849 Interstitial pulmonary disease, unspecified: Secondary | ICD-10-CM | POA: Insufficient documentation

## 2018-03-23 NOTE — Assessment & Plan Note (Signed)
Bilateral interstitial lung disease with restrictive defect on pulmonary function studies Currently no evidence of autoimmune process with negative autoimmune serology Patient's been unable to complete a rheumatology evaluation due to insurance barrier Plan I would like for the patient to see as a second opinion Dr. Brand Males of pulmonary medicine whose focus has been interstitial lung disease  Corticosteroids do not appear to be of benefit in this situation and have the patient has not responded  Inhaled medications also been of no benefit  I would be reluctant to prescribe either Imuran or CellCept in this patient who already has leukopenia and thrombocytopenia chronically  The patient's chronic liver disease will be a barrier to any specific therapy for the interstitial lung disease  Strict control of the reflux disease is imperative in this situation  A referral to Dr. Chase Caller was made at this visit

## 2018-03-23 NOTE — Assessment & Plan Note (Signed)
Cirrhotic liver disease with ascites Per gastroenterology and primary care

## 2018-03-23 NOTE — Assessment & Plan Note (Signed)
Chronic leukopenia felt to be secondary to cirrhotic liver disease Plan 10 you to observation

## 2018-03-23 NOTE — Assessment & Plan Note (Signed)
Chronic thrombocytopenia felt to be due to cirrhotic liver disease Plan Per hematology

## 2018-03-23 NOTE — Telephone Encounter (Signed)
Called patient unable to reach left message to give [place] a call back. Will route to emily.

## 2018-03-23 NOTE — Telephone Encounter (Signed)
Holly Mueller  Dr Joya Gaskins wants me to see Holly Mueller ass 2nd opionin for ILD Please briong her in 44 min slot regular clinic or ILD clinic - send ILD questions ahead of time please  Thanks    SIGNATURE    Dr. Brand Males, M.D., F.C.C.P,  Pulmonary and Critical Care Medicine Staff Physician, [place] Director - Interstitial Lung Disease  Program  [facility] [place] [facility] [place], [place], [address]  Pager: [phone], If no answer or between  15:00h - 7:00h: call [phone] Telephone: [phone]  1:01 PM 03/23/2018

## 2018-03-23 NOTE — Assessment & Plan Note (Signed)
Strict of lung disease on the basis of interstitial lung disease etiology not clear Biopsy has not yet been obtained  Review interstitial lung disease assessment

## 2018-03-23 NOTE — Assessment & Plan Note (Signed)
Gastroesophageal reflux disease is contributing to the patient's cough and I am concerned may be a genesis of the patient's lower lobe pulmonary fibrosis More aggressive control of reflux is indicated Plan Increase proton pump inhibitor to twice daily Reflux diet was reviewed with the patient

## 2018-03-23 NOTE — Assessment & Plan Note (Signed)
Chronic cough secondary to interstitial lung disease and high level reflux disease Plan Prescribe Delsym as a cough suppressant twice daily as needed No further corticosteroids as this is been ineffective in the past Increase proton pump inhibitor to twice daily with meals A reflux diet instruction was given to the patient

## 2018-03-23 NOTE — Assessment & Plan Note (Signed)
Dyspnea on exertion is secondary to the patient's interstitial lung disease Note the patient did not desaturate with exertion on this office visit

## 2018-03-24 NOTE — Telephone Encounter (Signed)
MR, pt is currently scheduled to see Dr. Vaughan Browner 03/28/18 at 2pm for an appt.  Do you want pt to keep that appt or go ahead and reschedule her to see you instead.

## 2018-03-24 NOTE — Telephone Encounter (Signed)
Dr Joya Gaskins wanted me to specfically see her. So, please make the change

## 2018-03-25 NOTE — Telephone Encounter (Signed)
Attempted to call pt but unable to reach her. Left message for pt to return call. 

## 2018-03-28 ENCOUNTER — Encounter: Payer: Self-pay | Admitting: Pulmonary Disease

## 2018-03-28 ENCOUNTER — Ambulatory Visit (INDEPENDENT_AMBULATORY_CARE_PROVIDER_SITE_OTHER): Payer: Medicaid Other | Admitting: Pulmonary Disease

## 2018-03-28 DIAGNOSIS — J849 Interstitial pulmonary disease, unspecified: Secondary | ICD-10-CM

## 2018-03-28 NOTE — Progress Notes (Addendum)
Holly Mueller    [id number]    [birth year]  Primary Care Physician:Timberlake, Curt Bears, MD  Referring Physician: [name], MD [place], [place] [address]  Chief complaint:  Follow up for respiratory failure, lung infiltrate with restrictive lung disease  HPI: Holly Mueller is a 52 year old with past history of asthma, cirrhosis, cytopenia, restrictive lung disease. Seen in the ED in July 2017 for an exacerbation. She is currently maintained on Symbicort and albuterol. She needs to use her albuterol every day. She denies any nighttime awakenings. She also has significant issues with sinusitis, postnasal drip with constant clearing of the throat. She has heartburn and is on Prilosec.  Admitted from 2/4-06/19/17 with fevers, diagnosed with flu.  Treated with Tamiflu.  She was seen by GI at that time for evaluation of cirrhosis and recommended outpatient follow-up with Dr. Carlean Purl. Hospitalized 2/19-2/24/19 with cellulitis, lower extremity pain.  Workup was negative for PE and DVT.  She was treated with Keflex.  CT showed stable mild groundglass opacities consistent with volume overload.    Hospitalized again in May 2019 for chest pain, dyspnea.  She had a stress test which is normal. Also seen by pulmonary for CTA that was negative for PE but showed some chronic lung opacities. Evaluated for aspiration.  Swallow eval was normal Serologies shows positive ANA, SSA  She has seen Dr. Lebron Conners, hematology for cytopenia status post bone marrow biopsy.  Her low blood counts are felt to be secondary to cirrhosis.  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 Exposures: Had exposed to mold in the past.  No dampness, heart type, Jacuzzi Smoking history: Denies smoking.  She is exposed to secondhand smoke Travel history: No significant travel history Relevant family history: No significant family history of lung disease.  Interim  History: Chronic cough continues with mild dyspnea on exertion. Referred to Dr. Chase Caller by Dr, Joya Gaskins, primary care for second opinion regarding interstitial lung disease.  Outpatient Encounter Medications as of 03/28/2018  Medication Sig  . ACCU-CHEK AVIVA PLUS test strip USE UP TO 4 TIMES DAILY AS DIRECTED  . ACCU-CHEK SOFTCLIX LANCETS lancets USE UP TO 4 TIMES DAILY AS DIRECTED  . albuterol (PROAIR HFA) 108 (90 Base) MCG/ACT inhaler INHALE 2 PUFFS INTO THE LUNGS EVERY 6 HOURS AS NEEDED FOR SHORTNESS OF BREATH  . albuterol (PROVENTIL) (2.5 MG/3ML) 0.083% nebulizer solution Take 3 mLs (2.5 mg total) by nebulization every 6 (six) hours as needed for wheezing or shortness of breath.  . blood glucose meter kit and supplies KIT Dispense based on patient and insurance preference. Use up to four times daily as directed. (FOR ICD-9 250.00, 250.01).  . cetirizine (ZYRTEC) 10 MG tablet Take 1 tablet (10 mg total) by mouth daily.  Marland Kitchen dextromethorphan (DELSYM) 30 MG/5ML liquid Take 5 mLs (30 mg total) by mouth 2 (two) times daily as needed for cough.  . fluticasone (FLONASE) 50 MCG/ACT nasal spray Place 2 sprays into both nostrils daily.  . furosemide (LASIX) 40 MG tablet Take 1 tablet (40 mg total) by mouth daily.  Marland Kitchen guaifenesin (HUMIBID E) 400 MG TABS tablet Take 1 tablet (400 mg total) by mouth every 4 (four) hours as needed (cough).  Marland Kitchen ibuprofen (ADVIL,MOTRIN) 200 MG tablet Take 600 mg by mouth every 6 (six) hours as needed for moderate pain.  Marland Kitchen levothyroxine (SYNTHROID, LEVOTHROID) 125 MCG tablet Take 2 tablets (250 mcg total) by mouth daily.  Marland Kitchen  metFORMIN (GLUCOPHAGE) 500 MG tablet Take 1 tablet (500 mg total) by mouth 2 (two) times daily with a meal.  . pantoprazole (PROTONIX) 40 MG tablet Take 1 tablet (40 mg total) by mouth 2 (two) times daily before a meal.  . ursodiol (ACTIGALL) 500 MG tablet Take 1 tablet (500 mg total) by mouth 2 (two) times daily.   No facility-administered encounter  medications on file as of 03/28/2018.    Physical Exam: Blood pressure 122/74, pulse 68, height _0  (1.676 m), weight 243 lb 6.4 oz (110.4 kg), SpO2 97 %. Gen:      No acute distress HEENT:  EOMI, sclera anicteric Neck:     No masses; no thyromegaly Lungs:    Faint basal crackles. CV:         Regular rate and rhythm; no murmurs Abd:      + bowel sounds; soft, non-tender; no palpable masses, no distension Ext:    No edema; adequate peripheral perfusion Skin:      Warm and dry; no rash Neuro: alert and oriented x 3 Psych: normal mood and affect  Data Reviewed: Imaging Chest x-ray 04/05/15- bibasilar opacities. Chest x-ray 11/16/15-left basal scarring.  CTA 11/23/16- no pulmonary embolism, no interstitial lung disease, cirrhosis, abdominal adenopathy. CTA 06/30/2017-no PE faint groundglass opacities, hepatic cirrhosis CTA 09/11/2017-no PE, diffuse groundglass opacity, cirrhosis. CTA 03/12/2018- no PE, minimal atelectasis and basal groundglass opacity. All images reviewed.  PFTs 11/13/16 FVC 1.84 (49%), FEV1 1.52 (51%), F/F 82, TLC 44% Unable to complete DLCO Severe restriction. No obstruction  FENO 01/07/17- 6 FENO 07/06/17 <5  Labs CBC 12/02/16- WBC 1.4, platelets 66, eosinophil 4%, absolute eosinophil count 100 Blood allergy profile 03/06/16-negative, IgE less than 2  ILD profile 09/16/2017 Hypersensitivity patient is panel negative ANA 1:80 speckled CCP < 16, double-stranded DNA 2, rheumatoid factor less than 14 Ro > 8, La < 1, SCL 70-, RNP negative  Assessment:  Persistent groundglass opacities Eval for ILD PFTs with restrictive abnormality.  Unclear etiology of her groundglass opacities With positive ANA, Ro antibody she could have underlying connective tissue disease.  Reports mold exposure at home but negative on hypersensitivity panel. She has been referred to rheumatology for evaluation of CTD but has not been able to get an appointment due to insurance status.  There is a  question of IPF however previous CT scans are not suggestive of this We will get high-resolution CT for further evaluation.  She will complete the ILD questionnaire and follow-up with Dr. Chase Caller for second opinion  Asthma Upper airway cough syndrome. GERD Suspicion for asthma is low as she does have any obstruction on PFTs and FENO is low. IgE and eos levels are normal.  Her sinusitis, postnasal drip and acid reflux are likely contributing to symptoms Continue Protonix, Flonase Currently off Symbicort.   Cirrhosis Needs with GI  Plan/Recommendations: - High-resolution CT - ILD follow-up with Dr. Chase Caller. - Continue PPI for acid reflux.  Marshell Garfinkel MD [facility] 03/28/2018, 1:55 PM  CC: [name], MD

## 2018-03-28 NOTE — Telephone Encounter (Signed)
Pt in office to see Dr. Vaughan Browner, as our office was unable to get in contact with patient to schedule with MR. Dr. Vaughan Browner would like pt to be scheduled in ILD clinic with MR. First available ILD clinic with MR is 05/17/18.   MR please advise if next available is okay, or if okay to use research slot. Thanks

## 2018-03-28 NOTE — Patient Instructions (Signed)
Schedule you for high-resolution CT for further evaluation of your interstitial lung disease Review questionnaire to be filled up before you return We will try to get you in to see Dr. Dot Been for ILD eval.  Follow up as needed with me after seeing Dr. Chase Caller

## 2018-03-29 ENCOUNTER — Other Ambulatory Visit: Payer: Medicaid Other

## 2018-03-29 ENCOUNTER — Inpatient Hospital Stay: Payer: Medicaid Other | Admitting: Adult Health

## 2018-03-29 NOTE — Telephone Encounter (Signed)
The date 11/27 is already full. Anderson Malta please advise.

## 2018-03-29 NOTE — Telephone Encounter (Signed)
1. First availble 30 min slot but ideally next 2-3 weeks 2. If not available, look at 04/06/18 AM - just opened 3. If not available, please talk to [facility] in research and see which one you can open up for Edilia Bo

## 2018-03-30 NOTE — Telephone Encounter (Signed)
Attempted to call pt to see if she could see MR on 04/11/18 at either 10am, 10:15, or 10:30 due to pt needing a 65mn appt with MR but unable to reach her.  Left message for pt to return call so we can see if we could get her scheduled for an appt.

## 2018-03-30 NOTE — Telephone Encounter (Signed)
The week of November 25th all slots are full and the one research subject on the 25th cannot be moved. The following week there is a research slot help on December 2nd and December 6th that can be used for this patient. I do not see any other held research slots for that week. If one of those do not work for her let me know and I will look further into December.   Thanks!

## 2018-04-04 ENCOUNTER — Encounter: Payer: Self-pay | Admitting: Family Medicine

## 2018-04-04 ENCOUNTER — Other Ambulatory Visit: Payer: Self-pay

## 2018-04-04 ENCOUNTER — Ambulatory Visit: Payer: Medicaid Other | Admitting: Psychology

## 2018-04-04 ENCOUNTER — Ambulatory Visit (INDEPENDENT_AMBULATORY_CARE_PROVIDER_SITE_OTHER): Payer: Self-pay | Admitting: Family Medicine

## 2018-04-04 DIAGNOSIS — Z598 Other problems related to housing and economic circumstances: Secondary | ICD-10-CM

## 2018-04-04 DIAGNOSIS — Z5989 Other problems related to housing and economic circumstances: Secondary | ICD-10-CM

## 2018-04-04 DIAGNOSIS — J849 Interstitial pulmonary disease, unspecified: Secondary | ICD-10-CM

## 2018-04-04 NOTE — Progress Notes (Signed)
   CC: discuss disability  HPI  She did a walk test with Dr. Joya Gaskins at [facility]. She was supposed to see Dr. MR but saw Dr. Vaughan Browner. She has a CT scan tomorrow. After that she has go to Dr. MR.   Called her son's medicaid case Insurance underwriter. She has sent all her hospital bills and says she doesn't get qualified bc her children are older. She was told that she needed to be blind or over 65 to get insurance. She is having to take her time at work 2/2 coughing and SOB. Has to apply for disability at the [facility] office and then she has to wait for the letter and once she gets the denial she can "fight it." missing more days of work than she is able to work.   She says [facility] is booked until December. Cant go to her appts if she doesn't have insurance but cant get disability if she doesn't get appts. Doesn't have a reliable phone.   ROS: Denies CP, SOB, abdominal pain, dysuria, changes in BMs.   CC, SH/smoking status, and VS noted  Objective: BP (!) 100/58   Pulse 77   Temp 97.8 F (36.6 C) (Oral)   Ht _0  (1.676 m)   Wt 240 lb 3.2 oz (109 kg)   SpO2 95%   BMI 38.77 kg/m  Gen: NAD, alert, cooperative, and pleasant. HEENT: NCAT, EOMI, PERRL CV: RRR, no murmur Resp: fine crackles throughout with good air movement, no wheezes, non-labored Abd: SNTND, BS present, no guarding or organomegaly Ext: No edema, warm Neuro: Alert and oriented, Speech clear, No gross deficits  Assessment and plan:  ILD (interstitial lung disease) (HCC) Following with pulm, awaiting appointment with Dr. Chase Caller, has CT tomorrow. Regarding her questions about disability, I think this could certainly be a disabiling diagnosis. I've asked her to reach out to social security to start the disability process and I will be happy to write her a letter if I can help with that process.   Insurance coverage problems Given warm handoff. Patient has low health literacy and trouble navigating the system. Unfortunately, our orange card and  referral coordinator is not in the office today. Was hoping to help the patient with orange card in person as she doesn't have a phone. [facility] will help her call the DSS office re orange card.   No orders of the defined types were placed in this encounter.   No orders of the defined types were placed in this encounter.   Ralene Ok, MD, PGY3 04/05/2018 4:55 PM

## 2018-04-04 NOTE — Patient Instructions (Signed)
It was a pleasure to see you today! Thank you for choosing [facility] for your primary care. Holly Mueller was seen for lung and insurance issues.   Our plans for today were:  Please go to the social security office and start your disability application. I'm happy to write you a letter when you get to that part in the process.   Please get a ride for your CT scan tomorrow.    Best,  Dr. Lindell Noe

## 2018-04-04 NOTE — BH Specialist Note (Signed)
Integrated Behavioral Health Initial Visit  MRN: [id number] Name: Holly Mueller  Number of [place] Clinician visits:: 1/6 Session Start time: 3:45 PM  Session End time: 4:05 PM Total time: 20 minutes  Type of Service: [place] Interpretor:No. Interpretor Name and Language: N/A   Warm Hand Off Completed.       SUBJECTIVE: Holly Mueller is a 52 y.o. female Patient was referred by Dr. Lindell Noe for assistance with community resources.  Patient reports the following symptoms/concerns: Support with [employer] Box program  Duration of problem: Not assessed; Severity of problem: mild  OBJECTIVE: Mood: Neutral and Affect: Appropriate Risk of harm to self or others: No evidence of SI.   LIFE CONTEXT: Family and Social: Not assessed.  School/Work: Not assessed. Self-Care: Not assessed. Life Changes: Not assessed.   GOALS ADDRESSED: Patient will: 1. Demonstrate ability to: Seek community resources  INTERVENTIONS: Interventions utilized: Link to [employer] Assessments completed: Not Needed  ASSESSMENT: Patient currently experiencing difficulty with applying for [employer], as she has limitations with technology and she is unable to get an appointment at [facility] to apply for [employer] given the booked schedule. [facility] intern called DSS to inquire about [employer] process and relayed information to patient. Patient reported that she has the documentation needed for the application at home and reported that she will visit the [place] in person to apply. [facility] intern also provided information about the Food Box program lunch series if the patient decided to receive additional boxes.   PLAN: 1. Follow up with behavioral health clinician on: Not scheduled; will contact integrated care as needed  2. Behavioral recommendations: 1) Apply for [employer] in person at  the [employer] office  3. Referral(s): Community Resources:  [employer]  Tammi Sou

## 2018-04-04 NOTE — Assessment & Plan Note (Signed)
ASSESSMENT: Patient currently experiencing difficulty with applying for [facility], as she has limitations with technology and she is unable to get an appointment at [facility] to apply for [employer] given the booked schedule. [facility] intern called DSS to inquire about [employer] process and relayed information to patient. Patient reported that she has the documentation needed for the application at home and reported that she will visit the [place] in person to apply. [facility] intern also provided information about the Food Box program lunch series if the patient decided to receive additional boxes.   PLAN: 1. Follow up with behavioral health clinician on: Not scheduled; will contact integrated care as needed  2. Behavioral recommendations: 1) Apply for [employer] in person at the [employer] office  3. Referral(s): Community Resources:  [employer]  Tammi Sou

## 2018-04-04 NOTE — Telephone Encounter (Signed)
Attempted to call pt but line went straight to VM and unable to leave a message due to VM being full.  Pt does have an active mychart account so I have sent pt a mychart message stating to her to contact our office so we can get her appt scheduled.  If pt does call our office, please schedule pt an appt with MR on the specified date of 04/11/18 at either 10:00, 10:15, or 10:30. Patient will need a 78mn appt as this is a consult for pt.

## 2018-04-05 ENCOUNTER — Ambulatory Visit (INDEPENDENT_AMBULATORY_CARE_PROVIDER_SITE_OTHER)
Admission: RE | Admit: 2018-04-05 | Discharge: 2018-04-05 | Disposition: A | Discharge status: Home / Self Care | Payer: Self-pay | Source: Ambulatory Visit | Attending: Pulmonary Disease | Admitting: Pulmonary Disease

## 2018-04-05 DIAGNOSIS — J849 Interstitial pulmonary disease, unspecified: Secondary | ICD-10-CM

## 2018-04-05 NOTE — Assessment & Plan Note (Signed)
Following with pulm, awaiting appointment with Dr. Chase Caller, has CT tomorrow. Regarding her questions about disability, I think this could certainly be a disabiling diagnosis. I've asked her to reach out to social security to start the disability process and I will be happy to write her a letter if I can help with that process.

## 2018-04-05 NOTE — Assessment & Plan Note (Signed)
Given warm handoff. Patient has low health literacy and trouble navigating the system. Unfortunately, our orange card and referral coordinator is not in the office today. Was hoping to help the patient with orange card in person as she doesn't have a phone. [facility] will help her call the DSS office re orange card.

## 2018-04-05 NOTE — Addendum Note (Signed)
Addended by: Zella Ball E on: 04/05/2018 12:10 PM   Modules accepted: Level of Service

## 2018-04-06 ENCOUNTER — Telehealth: Payer: Self-pay | Admitting: Pulmonary Disease

## 2018-04-06 NOTE — Telephone Encounter (Signed)
Called and spoke with patient, she is requesting results from CT. Advised that Dr. Vaughan Browner is out of the office.    Dr. Vaughan Browner please advise, once available. Thank you.

## 2018-04-11 NOTE — Telephone Encounter (Signed)
Pt needs to schedule a consult with Dr. Chase Caller for a second opinion appt. Dr. Joya Gaskins wanted pt to see MR for the consult but due to [place] not being able to reach pt, pt ended up seeing Dr. Vaughan Browner for the consult appt but Dr. Vaughan Browner has said for pt to schedule an appt with MR for a second opinion.  At the consult with MR, if she has not been told the results of the CT prior to that, the results can be told to pt at that [place].  Attempted to call pt to see if we could get her scheduled for an appt with MR but unable to reach her and unable to leave a VM due to mailbox being full. Per prior phone encounter where we have been trying to reach pt, pt can be scheduled for an appt with MR on 04/15/18 in the held research slot.  I have also sent pt a message via her mychart account stating that we need her to schedule an appt.

## 2018-04-12 NOTE — Telephone Encounter (Signed)
Unable to reach patient at work number; called mobile number on file-unable to leave message due to VM being full.

## 2018-04-13 NOTE — Telephone Encounter (Signed)
Please let patient know CT scan shows scarring and ILD in the lung.  In addition there is evidence of cirrhosis and atherosclerosis.  Can be discussed in detail at her visit in ILD clinic Make sure that she completes and brings the ILD questionnaire packet to appointment with Dr. Chase Caller.  She got this at her last clinic visit.  Marshell Garfinkel MD [place] 04/13/2018, 9:11 AM

## 2018-04-13 NOTE — Telephone Encounter (Signed)
I called pt but there was no answer and no VM to leave message.

## 2018-04-14 ENCOUNTER — Telehealth: Payer: Self-pay | Admitting: Pulmonary Disease

## 2018-04-18 ENCOUNTER — Telehealth: Payer: Self-pay | Admitting: Pulmonary Disease

## 2018-04-18 NOTE — Telephone Encounter (Signed)
I called pt to discuss but she did not answer and there was no voicemail to leave message. Will try again later.       9:37 AM  Randa Spike, CMA routed this conversation to [facility] Triage Mindi Junker, MD     9:09 AM  Note    Please let patient know CT scan shows scarring and ILD in the lung.  In addition there is evidence of cirrhosis and atherosclerosis.  Can be discussed in detail at her visit in ILD clinic Make sure that she completes and brings the ILD questionnaire packet to appointment with Dr. Chase Caller.  She got this at her last clinic visit.  Marshell Garfinkel MD [place] [facility] 04/13/2018, 9:11 AM

## 2018-04-19 NOTE — Telephone Encounter (Signed)
Erica please advise if you can close this encounter. Thanks.

## 2018-04-19 NOTE — Telephone Encounter (Signed)
ATC unable to reach went to VM and mailbox is full.  Patient does not have an appointment with MR will need to make an ILD clinic appt when we talk to patient. MR's ILD clinic is Tuesday afternoons, and Thursday morning.  Will leave message open

## 2018-04-19 NOTE — Telephone Encounter (Signed)
Pt is calling back [phone]

## 2018-04-20 NOTE — Telephone Encounter (Signed)
Patient returned call, CB is [phone].  We scheduled her appt with MR for his first available that fits her schedule, which is Wednesday, 05/25/2018.  She is requesting someone call the patient with her results, as she stated she does not want to wait until January for results.

## 2018-04-20 NOTE — Telephone Encounter (Signed)
Called patient unable to reach left message to give [place] a call back.

## 2018-04-20 NOTE — Telephone Encounter (Signed)
Called and spoke with patient, she is having trouble making an appointment to match with her schedule. Raquel Sarna please call patient to help get her scheduled with an appropriate time for the ILD clinic. She cannot not do tuesdays or thursdays.

## 2018-04-20 NOTE — Telephone Encounter (Signed)
Ok to give her any 1 day when I have a 30 min slot to accommodate her schedule (please note lot of back and forth on this patient -> first we could not get hold of her to make change from Dr Vaughan Browner to MR and then when Dr Vaughan Browner wrote for followup with me - front desk apparently gave followup back with him).

## 2018-04-21 NOTE — Telephone Encounter (Signed)
Spoke with patient she verbalized understanding nothing further needed at this time.

## 2018-04-21 NOTE — Telephone Encounter (Signed)
Pt has been scheduled for an appt with MR 05/25/18. Nothing further needed.

## 2018-04-21 NOTE — Telephone Encounter (Signed)
Patients mail box was full so I was unable to leave a message will call back.

## 2018-04-22 ENCOUNTER — Ambulatory Visit: Payer: Medicaid Other | Admitting: Pulmonary Disease

## 2018-04-24 NOTE — Progress Notes (Signed)
Subjective:   Patient ID: Holly Mueller    DOB: 14-Jan-1966, 52 y.o. female   MRN: [id number]  Holly Mueller is a 52 y.o. female with a history of migraine, ILD, AR, GERD, cirrhosis, hypothyroidism, DM2, h/o seizures, OA, depression, obesity here for   Cough Patient presents for persistent chronic cough.  Previously seen most recently 10/24, 11/05, 11/25 in our clinic for same.  Saw pulmonology 11/18 with some concern for autoimmune process given positive ANA, Ro antibody, has been referred to rheumatology however has been unable to be seen due to insurance issues.  Follow-up CT chest showed lung scarring and ILD.  Has follow-up with Dr. Chase Caller in 05/2018.  At last appointment advised continuing Protonix and Flonase which she has continued.  Patient states cough has not worsened and occurs every day.  States cough is productive of clear sputum and sometimes wakes her from her sleep.  She uses albuterol 5-6 times per day without much relief.  She has also tried Robitussin DM and Mucinex without relief.  Denies fevers.  Endorses fatigue and chest pain associated with coughing.  She can barely walk to the bathroom without getting short of breath.  Does endorse more mucus and fatigue since yesterday.  Review of Systems:  Per HPI.  [place], medications and smoking status reviewed.  Objective:   BP 114/64   Pulse 75   Temp 98.4 F (36.9 C) (Oral)   Ht _0  (1.676 m)   Wt 247 lb (112 kg)   SpO2 95% Comment: walking O2 from 97%-95%  BMI 39.87 kg/m  Vitals and nursing note reviewed.  General: Obese female, chronically ill-appearing, in no acute distress with non-toxic appearance HEENT: normocephalic, atraumatic, moist mucous membranes CV: regular rate and rhythm without murmurs, rubs, or gallops, 1+ pitting lower extremity edema Lungs: normal work of breathing with appropriate saturation on room air, bibasilar rales, no wheezing Skin: warm, dry, no rashes or lesions Extremities: warm and  well perfused, normal tone MSK: ROM grossly intact, strength intact, gait normal Neuro: Alert and oriented, speech normal  Assessment & Plan:   ILD (interstitial lung disease) ([place]) With persistent cough without recent worsening.  Ambulatory pulse ox within normal limits at clinic today.  Has upcoming appointment with pulmonology.  Advised continued supportive care measures with humidifier and expectorant.  Low suspicion for pneumonia given afebrile and persistent bilateral basilar rales.  Strict return precautions reviewed.  No orders of the defined types were placed in this encounter.  Meds ordered this encounter  Medications  . glucose blood (ACCU-CHEK AVIVA PLUS) test strip    Sig: USE UP TO 4 TIMES DAILY AS DIRECTED    Dispense:  100 each    Refill:  0  . ACCU-CHEK SOFTCLIX LANCETS lancets    Sig: USE UP TO 4 TIMES DAILY AS DIRECTED    Dispense:  100 each    Refill:  0  . albuterol (PROAIR HFA) 108 (90 Base) MCG/ACT inhaler    Sig: INHALE 2 PUFFS INTO THE LUNGS EVERY 6 HOURS AS NEEDED FOR SHORTNESS OF BREATH    Dispense:  8.5 g    Refill:  5  . albuterol (PROVENTIL) (2.5 MG/3ML) 0.083% nebulizer solution    Sig: Take 3 mLs (2.5 mg total) by nebulization every 6 (six) hours as needed for wheezing or shortness of breath.    Dispense:  75 mL    Refill:  [place], DO PGY-2, [facility] 04/25/2018 5:58  PM  

## 2018-04-25 ENCOUNTER — Encounter: Payer: Self-pay | Admitting: Family Medicine

## 2018-04-25 ENCOUNTER — Telehealth: Payer: Self-pay | Admitting: Family Medicine

## 2018-04-25 ENCOUNTER — Other Ambulatory Visit: Payer: Self-pay

## 2018-04-25 ENCOUNTER — Ambulatory Visit (INDEPENDENT_AMBULATORY_CARE_PROVIDER_SITE_OTHER): Payer: Self-pay | Admitting: Family Medicine

## 2018-04-25 VITALS — BP 114/64 | HR 75 | Temp 98.4°F | Ht 66.0 in | Wt 247.0 lb

## 2018-04-25 DIAGNOSIS — R0600 Dyspnea, unspecified: Secondary | ICD-10-CM

## 2018-04-25 DIAGNOSIS — J849 Interstitial pulmonary disease, unspecified: Secondary | ICD-10-CM

## 2018-04-25 MED ORDER — ALBUTEROL SULFATE HFA 108 (90 BASE) MCG/ACT IN AERS: INHALATION_SPRAY | RESPIRATORY_TRACT | 5 refills | Status: AC

## 2018-04-25 MED ORDER — GLUCOSE BLOOD VI STRP: ORAL_STRIP | 0 refills | Status: DC

## 2018-04-25 MED ORDER — ACCU-CHEK SOFTCLIX LANCETS MISC: 0 refills | Status: DC

## 2018-04-25 MED ORDER — ALBUTEROL SULFATE (2.5 MG/3ML) 0.083% IN NEBU: 2.5000 mg | INHALATION_SOLUTION | Freq: Four times a day (QID) | RESPIRATORY_TRACT | 2 refills | Status: DC | PRN

## 2018-04-25 NOTE — Assessment & Plan Note (Signed)
With persistent cough without recent worsening.  Ambulatory pulse ox within normal limits at clinic today.  Has upcoming appointment with pulmonology.  Advised continued supportive care measures with humidifier and expectorant.  Low suspicion for pneumonia given afebrile and persistent bilateral basilar rales.  Strict return precautions reviewed.

## 2018-04-25 NOTE — Patient Instructions (Addendum)
It was great to see you!  Our plans for today:  - Keep your appointment with Pulmonology in January. - Continue to take your medications as directed.  - Try using a humidifier to loosen secretions.  Take care and seek immediate care sooner if you develop any concerns.   Dr. [facility]

## 2018-04-25 NOTE — Telephone Encounter (Signed)
After Hours/Emergency Line Call  Received call from patient regarding worsening SOB and cough since turning to home after being seen in the clinic earlier today.  Patient says her cough is productive with clear to white sputum production which is her baseline.  She does have known interstitial lung disease and has been taking her albuterol every 4 hours without much improvement.  She does recall having viral URI symptoms including rhinorrhea last week.  Her temperature was 76F at home patient was afebrile during evaluation earlier today without tachycardia.  She does not take oxygen at home.  Patient denies chest pain, hemoptysis, syncope.  Reviewed reasons to call 911.  Next day appointment slots unavailable.  Advised patient to call clinic in the morning for same-day appointment since her symptoms worsened following her last evaluation.  Patient in agreement with plan with good understanding.  Will forward to PCP.  Harriet [place], [place], PGY-3

## 2018-04-26 ENCOUNTER — Encounter (HOSPITAL_COMMUNITY): Payer: Self-pay | Admitting: Emergency Medicine

## 2018-04-26 ENCOUNTER — Other Ambulatory Visit: Payer: Self-pay

## 2018-04-26 ENCOUNTER — Emergency Department (HOSPITAL_COMMUNITY): Payer: Self-pay

## 2018-04-26 ENCOUNTER — Emergency Department (HOSPITAL_COMMUNITY)
Admission: EM | Admit: 2018-04-26 | Discharge: 2018-04-27 | Disposition: A | Discharge status: Home / Self Care | Payer: Self-pay | Attending: Emergency Medicine | Admitting: Emergency Medicine

## 2018-04-26 DIAGNOSIS — Z87891 Personal history of nicotine dependence: Secondary | ICD-10-CM | POA: Insufficient documentation

## 2018-04-26 DIAGNOSIS — Z79899 Other long term (current) drug therapy: Secondary | ICD-10-CM | POA: Insufficient documentation

## 2018-04-26 DIAGNOSIS — J069 Acute upper respiratory infection, unspecified: Secondary | ICD-10-CM | POA: Insufficient documentation

## 2018-04-26 DIAGNOSIS — J849 Interstitial pulmonary disease, unspecified: Secondary | ICD-10-CM | POA: Insufficient documentation

## 2018-04-26 DIAGNOSIS — E039 Hypothyroidism, unspecified: Secondary | ICD-10-CM | POA: Insufficient documentation

## 2018-04-26 DIAGNOSIS — E119 Type 2 diabetes mellitus without complications: Secondary | ICD-10-CM | POA: Insufficient documentation

## 2018-04-26 DIAGNOSIS — Z7984 Long term (current) use of oral hypoglycemic drugs: Secondary | ICD-10-CM | POA: Insufficient documentation

## 2018-04-26 MED ORDER — SODIUM CHLORIDE 0.9 % IV BOLUS
1000.0000 mL | Freq: Once | INTRAVENOUS | Status: AC
  Administered 2018-04-26: 1000 mL via INTRAVENOUS

## 2018-04-26 NOTE — ED Notes (Signed)
Patient transported to X-ray

## 2018-04-26 NOTE — ED Provider Notes (Signed)
[facility] EMERGENCY DEPARTMENT Provider Note   CSN: [id number] Arrival date & time: 04/26/18  2317    History   Chief Complaint Chief Complaint  Patient presents with  . Fever  . Nausea    HPI Holly Mueller is a 52 y.o. female.  52 year old female with a history of interstitial lung disease, esophageal reflux, diabetes presents to the emergency department for evaluation of upper respiratory symptoms with vomiting and diarrhea.  She has had a chronic cough for over 1 month.  She has been using her albuterol inhaler as well as expectorants for management of this.  Saw primary care yesterday in follow-up for symptoms.  States that she has since developed vomiting and diarrhea.  Does report that most of her vomiting is provoked by coughing.  She has continued to tolerate food and fluids by mouth.  She has been using DayQuil for symptoms without relief.  Highest recorded temperature at home was 99.71F.  Denies sick contacts, hematemesis, melena, hematochezia.  Is scheduled to see Dr. Chase Caller in January.  PCP - [facility]     Past Medical History:  Diagnosis Date  . Arthritis    bursitis left hip flares-not an issue  . Diabetes mellitus without complication ([place])   . Edentulous    10-19-13 at present  . Epilepsy ([place]) in 1972   No seizures since 1972. Previously treated with phenobarbital.   . Fever 06/2017  . GERD (gastroesophageal reflux disease) 2008  . Headache(784.0)    hx of migraines   . Hypothyroidism 2008  . Lower esophageal ring 08/18/2013  . Seasonal allergies 2003  . Shortness of breath     Patient Active Problem List   Diagnosis Date Noted  . Insurance coverage problems 04/04/2018  . ILD (interstitial lung disease) ([place]) 03/23/2018  . Food insecurity 10/15/2017  . Hyperglycemia 10/06/2017  . Ectatic aorta ([place]) 09/11/2017  . Left leg pain   . Type 2 diabetes mellitus without complication, without long-term current use of  insulin ([place])   . Cirrhosis of liver without ascites ([place]) 11/17/2016  . Thrombocytopenia ([place]) 11/17/2016  . Leucopenia 11/17/2016  . Bicytopenia 10/24/2016  . Epistaxis 10/23/2016  . GERD (gastroesophageal reflux disease) 10/23/2016  . Cough 12/09/2015  . Chronic pain of left thumb 10/09/2015  . Precordial chest pain 08/03/2015  . Bursitis of left hip 09/14/2013  . Lower esophageal ring 08/18/2013  . DOE (dyspnea on exertion) 08/17/2013  . Ganglion cyst of wrist 08/24/2012  . Right hip pain 08/23/2012  . Umbilical hernia 35/00/9381  . Healthcare maintenance 02/14/2012  . Osteoarthritis 02/12/2012  . Allergic rhinitis 08/26/2011  . Obesity 07/08/2010  . SEIZURES, HX OF 12/12/2009  . DENTAL CARIES 12/18/2008  . DEPRESSION 07/26/2008  . MIGRAINE HEADACHE 11/15/2007  . Hypothyroidism 10/12/2006  . Restrictive lung disease 07/30/2006    Past Surgical History:  Procedure Laterality Date  . BALLOON DILATION N/A 10/24/2013   Procedure: BALLOON DILATION;  Surgeon: Gatha Mayer, MD;  Location: [facility] ENDOSCOPY;  Service: Endoscopy;  Laterality: N/A;  . CESAREAN SECTION     x2  . DENTAL SURGERY     multiple extractions 3'15  . ESOPHAGOGASTRODUODENOSCOPY N/A 10/24/2013   Procedure: ESOPHAGOGASTRODUODENOSCOPY (EGD);  Surgeon: Gatha Mayer, MD;  Location: [facility];  Service: Endoscopy;  Laterality: N/A;  . LIVER BIOPSY  06/30/2012   Procedure: LIVER BIOPSY;  Surgeon: Shann Medal, MD;  Location: [facility] ORS;  Service: General;;  . NOVASURE ABLATION    .  SHOULDER ARTHROSCOPY Right 2011  . UMBILICAL HERNIA REPAIR N/A 06/30/2012   Procedure: remove umbilicus;  Surgeon: Shann Medal, MD;  Location: [facility] ORS;  Service: General;  Laterality: N/A;  . VENTRAL HERNIA REPAIR N/A 06/30/2012   Procedure: LAPAROSCOPIC VENTRAL HERNIA;  Surgeon: Shann Medal, MD;  Location: [facility] ORS;  Service: General;  Laterality: N/A;  With Mesh  . WISDOM TOOTH EXTRACTION       OB History   No obstetric history  on file.      Home Medications    Prior to Admission medications   Medication Sig Start Date End Date Taking? Authorizing Provider  albuterol (PROAIR HFA) 108 (90 Base) MCG/ACT inhaler INHALE 2 PUFFS INTO THE LUNGS EVERY 6 HOURS AS NEEDED FOR SHORTNESS OF BREATH 04/25/18  Yes Rumball, Alison, DO  albuterol (PROVENTIL) (2.5 MG/3ML) 0.083% nebulizer solution Take 3 mLs (2.5 mg total) by nebulization every 6 (six) hours as needed for wheezing or shortness of breath. 04/25/18  Yes Rory Percy, DO  cetirizine (ZYRTEC) 10 MG tablet Take 1 tablet (10 mg total) by mouth daily. 01/27/17  Yes [name], MD  dextromethorphan (DELSYM) 30 MG/5ML liquid Take 5 mLs (30 mg total) by mouth 2 (two) times daily as needed for cough. 03/22/18  Yes Elsie Stain, MD  fluticasone (FLONASE) 50 MCG/ACT nasal spray Place 2 sprays into both nostrils daily.   Yes [provider]  furosemide (LASIX) 40 MG tablet Take 1 tablet (40 mg total) by mouth daily. 03/07/18  Yes Hensel, Jamal Collin, MD  guaifenesin (HUMIBID E) 400 MG TABS tablet Take 1 tablet (400 mg total) by mouth every 4 (four) hours as needed (cough). 03/15/18  Yes Meccariello, Bernita Raisin, DO  ibuprofen (ADVIL,MOTRIN) 200 MG tablet Take 600 mg by mouth every 6 (six) hours as needed for moderate pain.   Yes [provider]  levothyroxine (SYNTHROID, LEVOTHROID) 125 MCG tablet Take 2 tablets (250 mcg total) by mouth daily. 01/05/18  Yes Rory Percy, DO  metFORMIN (GLUCOPHAGE) 500 MG tablet Take 1 tablet (500 mg total) by mouth 2 (two) times daily with a meal. 03/04/18  Yes Hensel, Jamal Collin, MD  pantoprazole (PROTONIX) 40 MG tablet Take 1 tablet (40 mg total) by mouth 2 (two) times daily before a meal. 03/22/18  Yes Elsie Stain, MD  ursodiol (ACTIGALL) 500 MG tablet Take 1 tablet (500 mg total) by mouth 2 (two) times daily. 03/03/18  Yes Hensel, Jamal Collin, MD  ACCU-CHEK SOFTCLIX LANCETS lancets USE UP TO 4 TIMES DAILY AS  DIRECTED 04/25/18   Rory Percy, DO  amoxicillin-clavulanate (AUGMENTIN) 875-125 MG tablet Take 1 tablet by mouth every 12 (twelve) hours. 04/27/18   Antonietta Breach, PA-C  blood glucose meter kit and supplies KIT Dispense based on patient and insurance preference. Use up to four times daily as directed. (FOR ICD-9 250.00, 250.01). 06/19/17   Rory Percy, DO  glucose blood (ACCU-CHEK AVIVA PLUS) test strip USE UP TO 4 TIMES DAILY AS DIRECTED 04/25/18   Rory Percy, DO  HYDROcodone-homatropine (HYCODAN) 5-1.5 MG/5ML syrup Take 5 mLs by mouth every 6 (six) hours as needed for cough. 04/27/18   Antonietta Breach, PA-C    Family History Family History  Problem Relation Age of Onset  . Hypertension Mother   . Diabetes Mother   . Lung cancer Father   . Stroke Father   . Diabetes Sister   . Hypertension Sister     Social History Social History   Tobacco  Use  . Smoking status: Former Smoker    Packs/day: 0.25    Types: Cigarettes    Last attempt to quit: 1997    Years since quitting: 22.9  . Smokeless tobacco: Never Used  . Tobacco comment: social  Substance Use Topics  . Alcohol use: Yes    Comment: occ  . Drug use: No     Allergies   Aspirin   Review of Systems Review of Systems Ten systems reviewed and are negative for acute change, except as noted in the HPI.    Physical Exam Updated Vital Signs BP 118/75   Pulse 87   Temp 98.6 F (37 C) (Oral)   Resp 16   SpO2 97%   Physical Exam Vitals signs and nursing note reviewed.  Constitutional:      General: She is not in acute distress.    Appearance: She is well-developed. She is not diaphoretic.     Comments: Nontoxic appearing and in NAD  HENT:     Head: Normocephalic and atraumatic.     Nose:     Comments: Audible nasal congestion    Mouth/Throat:     Comments: Tolerating secretions. No tripoding or stridor. Eyes:     General: No scleral icterus.    Conjunctiva/sclera: Conjunctivae normal.  Neck:      Musculoskeletal: Normal range of motion.  Cardiovascular:     Rate and Rhythm: Normal rate and regular rhythm.     Pulses: Normal pulses.  Pulmonary:     Effort: Pulmonary effort is normal. No respiratory distress.     Comments: Dry, hacking, nonproductive cough. Lungs grossly clear; mild adventitious sounds scattered bilaterally on auscultation.  Musculoskeletal: Normal range of motion.  Skin:    General: Skin is warm and dry.     Coloration: Skin is not pale.     Findings: No erythema or rash.  Neurological:     Mental Status: She is alert and oriented to person, place, and time.     Comments: GCS 15. Patient moving all extremities.  Psychiatric:        Behavior: Behavior normal.      ED Treatments / Results  Labs (all labs ordered are listed, but only abnormal results are displayed) Labs Reviewed  CBC WITH DIFFERENTIAL/PLATELET - Abnormal; Notable for the following components:      Result Value   WBC 1.7 (*)    RBC 3.62 (*)    Hemoglobin 10.5 (*)    HCT 31.7 (*)    Platelets 51 (*)    Neutro Abs 1.0 (*)    Lymphs Abs 0.5 (*)    All other components within normal limits  BASIC METABOLIC PANEL - Abnormal; Notable for the following components:   Glucose, Bld 142 (*)    Calcium 8.2 (*)    All other components within normal limits    EKG None  Radiology Dg Chest 2 View  Result Date: 04/27/2018 CLINICAL DATA:  Fever and cough. EXAM: CHEST - 2 VIEW COMPARISON:  CT chest dated April 05, 2018. Chest x-ray dated March 12, 2018. FINDINGS: The heart size and mediastinal contours are within normal limits. Normal pulmonary vascularity. Persistent low lung volumes. Mild lower lobe predominant interstitial thickening is similar to prior study. No focal consolidation, pleural effusion, or pneumothorax. No acute osseous abnormality. IMPRESSION: 1.  No active cardiopulmonary disease. 2. Mild chronic interstitial changes at the lung bases, similar to prior study. Electronically  Signed   By: Orville Govern.D.  On: 04/27/2018 00:22    Procedures Procedures (including critical care time)  Medications Ordered in ED Medications  sodium chloride 0.9 % bolus 1,000 mL (0 mLs Intravenous Stopped 04/27/18 0219)  albuterol (PROVENTIL) (2.5 MG/3ML) 0.083% nebulizer solution 5 mg (5 mg Nebulization Given 04/27/18 0113)  ipratropium (ATROVENT) nebulizer solution 0.5 mg (0.5 mg Nebulization Given 04/27/18 0113)  HYDROcodone-homatropine (HYCODAN) 5-1.5 MG/5ML syrup 5 mL (5 mLs Oral Given 04/27/18 0301)  benzonatate (TESSALON) capsule 100 mg (100 mg Oral Given 04/27/18 0301)     Initial Impression / Assessment and Plan / ED Course  I have reviewed the triage vital signs and the nursing notes.  Pertinent labs & imaging results that were available during my care of the patient were reviewed by me and considered in my medical decision making (see chart for details).     Patient with hx of ILD complaining of symptoms of URI with persistence of known chronic cough.  Tmax 99.79F prior to arrival.  Was seen by her PCP yesterday (Monday) for similar complaints.  The patient today has a harsh, nonproductive cough.  She appears uncomfortable when coughing, but nontoxic.  No signs of respiratory distress; no tachypnea or hypoxia.  She underwent laboratory evaluation which is stable compared to baseline.  Has a chronic leukopenia thrombocytopenia, anemia, and neutropenia which are unchanged.  Patient previously underwent bone marrow biopsy in 2018 for further evaluation of this.  Has been followed by oncology.  Chest x-ray shows findings consistent with chronic interstitial lung disease.  No focal consolidation or pneumonia.  No pleural effusion or pneumothorax.  The patient was given a breathing treatment while in the ED.  She has been able to ambulate in the department without hypoxia.  Given increased nasal congestion, mild sore throat, persistent and subjectively worse cough,  question developing sinusitis.  She has been placed on a course of Augmentin for this reason.  We will also provide a short course of Hycodan for management of cough.  This should also assist in managing the patient's chest wall pain, consistent with costochondritis.  I do not believe further emergent work-up or imaging is indicated.  No current criteria for admission.  She has been encouraged to follow-up with her primary care doctor as well as her pulmonologist.  Return precautions discussed and provided. Patient discharged in stable condition with no unaddressed concerns.   Final Clinical Impressions(s) / ED Diagnoses   Final diagnoses:  URI with cough and congestion  ILD (interstitial lung disease) [facility])    ED Discharge Orders         Ordered    HYDROcodone-homatropine (HYCODAN) 5-1.5 MG/5ML syrup  Every 6 hours PRN     04/27/18 0347    amoxicillin-clavulanate (AUGMENTIN) 875-125 MG tablet  Every 12 hours     04/27/18 0347           Antonietta Breach, PA-C 04/27/18 0430    Mesner, Corene Cornea, MD 04/27/18 [id number]

## 2018-04-26 NOTE — ED Triage Notes (Signed)
Pt arrives to ED from home with complaints of fever,cough, nausea, and emesis. EMS reports pt has had nonproductive cough for over a month. Yesterday pt stated she had nausea and emesis. Denies diarrhea. Pt states she had a temp of 99.4 at home and has been taking dayquil. Temp on arrival is 98.6 oral. Pt states she has chest pain when coughing. Pt placed in position of comfort with bed locked and lowered, call bell in reach.

## 2018-04-26 NOTE — ED Notes (Signed)
ED Provider at bedside. 

## 2018-04-27 LAB — BASIC METABOLIC PANEL
Anion gap: 7 (ref 5–15)
BUN: 6 mg/dL (ref 6–20)
CHLORIDE: 108 mmol/L (ref 98–111)
CO2: 22 mmol/L (ref 22–32)
CREATININE: 0.62 mg/dL (ref 0.44–1.00)
Calcium: 8.2 mg/dL — ABNORMAL LOW (ref 8.9–10.3)
GFR calc Af Amer: >60 mL/min (ref >60)
GFR calc non Af Amer: >60 mL/min (ref >60)
Glucose, Bld: 142 mg/dL — ABNORMAL HIGH (ref 70–99)
Potassium: 3.6 mmol/L (ref 3.5–5.1)
SODIUM: 137 mmol/L (ref 135–145)

## 2018-04-27 LAB — CBC WITH DIFFERENTIAL/PLATELET
Abs Immature Granulocytes: 0.02 10*3/uL (ref 0.00–0.07)
Basophils Absolute: 0 10*3/uL (ref 0.0–0.1)
Basophils Relative: 2 %
EOS ABS: 0.1 10*3/uL (ref 0.0–0.5)
EOS PCT: 4 %
HEMATOCRIT: 31.7 % — AB (ref 36.0–46.0)
HEMOGLOBIN: 10.5 g/dL — AB (ref 12.0–15.0)
Immature Granulocytes: 1 %
LYMPHS PCT: 26 %
Lymphs Abs: 0.5 10*3/uL — ABNORMAL LOW (ref 0.7–4.0)
MCH: 29 pg (ref 26.0–34.0)
MCHC: 33.1 g/dL (ref 30.0–36.0)
MCV: 87.6 fL (ref 80.0–100.0)
MONO ABS: 0.2 10*3/uL (ref 0.1–1.0)
Monocytes Relative: 12 %
NRBC: 0 % (ref 0.0–0.2)
Neutro Abs: 1 10*3/uL — ABNORMAL LOW (ref 1.7–7.7)
Neutrophils Relative %: 55 %
Platelets: 51 10*3/uL — ABNORMAL LOW (ref 150–400)
RBC: 3.62 MIL/uL — AB (ref 3.87–5.11)
RDW: 14.3 % (ref 11.5–15.5)
WBC: 1.7 10*3/uL — AB (ref 4.0–10.5)

## 2018-04-27 MED ORDER — BENZONATATE 100 MG PO CAPS
100.0000 mg | ORAL_CAPSULE | Freq: Once | ORAL | Status: AC
  Administered 2018-04-27: 100 mg via ORAL
  Filled 2018-04-27: qty 1

## 2018-04-27 MED ORDER — IPRATROPIUM BROMIDE 0.02 % IN SOLN
0.5000 mg | Freq: Once | RESPIRATORY_TRACT | Status: AC
  Administered 2018-04-27: 0.5 mg via RESPIRATORY_TRACT
  Filled 2018-04-27: qty 2.5

## 2018-04-27 MED ORDER — ALBUTEROL SULFATE (2.5 MG/3ML) 0.083% IN NEBU
5.0000 mg | INHALATION_SOLUTION | Freq: Once | RESPIRATORY_TRACT | Status: AC
  Administered 2018-04-27: 5 mg via RESPIRATORY_TRACT
  Filled 2018-04-27: qty 6

## 2018-04-27 MED ORDER — HYDROCODONE-HOMATROPINE 5-1.5 MG/5ML PO SYRP
5.0000 mL | ORAL_SOLUTION | Freq: Once | ORAL | Status: AC
  Administered 2018-04-27: 5 mL via ORAL
  Filled 2018-04-27: qty 5

## 2018-04-27 MED ORDER — HYDROCODONE-HOMATROPINE 5-1.5 MG/5ML PO SYRP: 5.0000 mL | ORAL_SOLUTION | Freq: Four times a day (QID) | ORAL | 0 refills | Status: DC | PRN

## 2018-04-27 MED ORDER — AMOXICILLIN-POT CLAVULANATE 875-125 MG PO TABS: 1.0000 | ORAL_TABLET | Freq: Two times a day (BID) | ORAL | 0 refills | Status: DC

## 2018-04-27 NOTE — Discharge Instructions (Signed)
Your evaluation in the emergency department was reassuring and stable compared to prior work-ups.  Given your sinus congestion and ongoing cough, you have been placed on a course of Augmentin for management of sinusitis.  Take as prescribed until finished.  Continue your home prescriptions as previously instructed by your primary care doctor.  You may also use home saline rinses for management of nasal congestion.  Use Hycodan as needed for severe cough.  Do not drive or drink alcohol after taking this medication as it may be you drowsy and impair your judgment.  Follow-up with your primary doctor as well as your pulmonologist.

## 2018-04-27 NOTE — ED Notes (Signed)
Pt tolerated PO fluids well. Pt not feeling nauseated after drinking fluids

## 2018-04-27 NOTE — ED Notes (Signed)
Patient ambulated to bathroom while wearing the pulse oximetry, 94% O2.  Patient had no complaints, observed a steady gait.

## 2018-05-25 ENCOUNTER — Encounter: Payer: Self-pay | Admitting: Internal Medicine

## 2018-05-25 ENCOUNTER — Ambulatory Visit (INDEPENDENT_AMBULATORY_CARE_PROVIDER_SITE_OTHER): Payer: Self-pay | Admitting: Internal Medicine

## 2018-05-25 VITALS — BP 120/68 | HR 70 | Ht 66.0 in | Wt 241.0 lb

## 2018-05-25 DIAGNOSIS — J849 Interstitial pulmonary disease, unspecified: Secondary | ICD-10-CM

## 2018-05-25 DIAGNOSIS — R05 Cough: Secondary | ICD-10-CM

## 2018-05-25 DIAGNOSIS — R059 Cough, unspecified: Secondary | ICD-10-CM

## 2018-05-25 LAB — NITRIC OXIDE: Nitric Oxide: <5

## 2018-05-25 MED ORDER — MOMETASONE FUROATE 200 MCG/ACT IN AERO: 2.0000 | INHALATION_SPRAY | Freq: Two times a day (BID) | RESPIRATORY_TRACT | 0 refills | Status: DC

## 2018-05-25 NOTE — Patient Instructions (Addendum)
ICD-10-CM   1. Interstitial pulmonary disease ([place]) J84.9   2. Cough R05      You do have pulmonary fibrosis but the variety and type is not known Cough likely from fibrosis   PLan  for fibrosis:   - will discuss at case conference in feb 2020  - will take another opinion from another radiologist  - based on this need to figure out best biopsy (I think you definitely need biopsy) method - highest yield with lowest risk esp with lower platelets   - do spirometry and dlco in 5 weeks   - do ONO test on room air at sleep anytime next few weeks  For cough  - likely due to fibrosis but with allergy history - might be worth re-challenge with inhaler sample  - take asmanex 2 puff twice daily   Followup ILD clinic  In 5 weeks

## 2018-05-25 NOTE — Progress Notes (Signed)
.  Is been seeing her for a while     HPI Oc 2017  : Holly Mueller is a 53 year old with past history of asthma. She has poor control over the past year and needed to go on several tapers of prednisone. She was seen in the ED in July 2017 for an exacerbation. She is currently maintained on Symbicort and albuterol. She needs to use her albuterol every day. She denies any nighttime awakenings. She also has significant issues with sinusitis, postnasal drip with constant clearing of the throat. She has heartburn and is on Prilosec.   Interim History Aug 2018: Seen in ED in July 2018 for dyspnea, cough, sinus and throat congestion. CTA did not show PE, there was mild atelectasis, GGO at base. PFTs do not show obstruction. There is restriction but no ILD on CT Today in the office she has chronic cough, no dyspnea or wheezing  She is in being evaluated for cytopenia. She's had a bone marrow biopsy which did not show any abnormality. She has a right upper quadrant ultrasound and a GI Eval Pending for Evaluation of Cirrhosis.   Interim History feb 2019: Admitted from 2/4-06/19/17 with fevers, diagnosed with flu.  Treated with Tamiflu.  She was seen by GI at that time for evaluation of cirrhosis and recommended outpatient follow-up with Dr. Carlean Purl. Hospitalized again from 2/19-2/24/19 with cellulitis, lower extremity pain.  Workup was negative for PE and DVT.  She was treated with Keflex.  CT showed stable mild groundglass opacities consistent with volume overload.    Reports that breathing is stable.  Still continues on Symbicort and albuterol.  Chief complaint today is left lower extremity pain.  There is no swelling, erythema, fevers, chills.     Interim History May 2019: Hospitalized again and early May for chest pain, dyspnea.  She had a stress test which is normal. Also seen by pulmonary for CTA that was negative for PE but showed some chronic lung opacities. Evaluated for aspiration.   Swallow eval was normal Serologies shows positive ANA, SSA    Nov 2019: Holly Mueller is a 53 year old with past history of asthma, cirrhosis, cytopenia, restrictive lung disease. Seen in the ED in July 2017 for an exacerbation. She is currently maintained on Symbicort and albuterol. She needs to use her albuterol every day. She denies any nighttime awakenings. She also has significant issues with sinusitis, postnasal drip with constant clearing of the throat. She has heartburn and is on Prilosec.  Admitted from 2/4-06/19/17 with fevers, diagnosed with flu.  Treated with Tamiflu.  She was seen by GI at that time for evaluation of cirrhosis and recommended outpatient follow-up with Dr. Carlean Purl. Hospitalized 2/19-2/24/19 with cellulitis, lower extremity pain.  Workup was negative for PE and DVT.  She was treated with Keflex.  CT showed stable mild groundglass opacities consistent with volume overload.    Hospitalized again in May 2019 for chest pain, dyspnea.  She had a stress test which is normal. Also seen by pulmonary for CTA that was negative for PE but showed some chronic lung opacities. Evaluated for aspiration.  Swallow eval was normal Serologies shows positive ANA, SSA  She has seen Dr. Lebron Conners, hematology for cytopenia status post bone marrow biopsy.  Her low blood counts are felt to be secondary to cirrhosis.  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 Exposures: Had exposed to mold in the past.  No dampness, heart type, Jacuzzi Smoking history: Denies smoking.  She is exposed to secondhand smoke Travel history: No significant travel history Relevant family history: No significant family history of lung disease.   Interim History: Nov 2019 Chronic cough continues with mild dyspnea on exertion. Referred to Dr. Chase Caller by Dr, Joya Gaskins, primary care for second opinion regarding interstitial lung disease.   OV 05/25/2018  Subjective:  Patient ID: Holly Mueller, female , DOB: [birth year] , age 53 y.o. , MRN: [id number] , ADDRESS: [place] [address]   05/25/2018 -   Chief Complaint  Patient presents with  . Follow-up    Pt states things have been worse for her since last visit. Pt has complaints of SOB which happens almost at any time, has an occ cough with white foamy mucus, and also has some complaints of CP.     HPI Holly Mueller 53 y.o. -referred by Dr. Asencion Noble and Dr. Marshell Garfinkel for interstitial lung disease.  Is a very complex [place].  Much of the work-up is already been done.  The time spent was in reviewing the chart and talking to the patient.  It appears she has idiopathic cirrhosis [reviewed and confirmed to 2014 or 2015 liver biopsy showing cirrhosis with active inflammation].  She does not follow-up with a GI doctor.  Her platelets run around 50-60000.  She sees Dr. Neldon Mc for allergy.  She used to be on asthma inhaler but following repeated normal nitric oxide her Symbicort was stopped maybe approximately over a year ago.  She is not sure if the cough is worse after that.  Her current problem seems to be dyspnea on exertion for the last few to several years progressively getting worse.  She also has constant chronic cough.  She is not on oxygen.  She does not recollect having her overnight oxygen desaturation tested.  It appears a few years ago her CT scans were fairly clear and suggestive of volume overload but with time the CT scans of change.  Most recent CT scan of the chest in late 2019 is reported as probable UIP based on craniocaudal gradient and some traction bronchiectasis without honeycombing.  The findings are early and mild.  I personally visualized the CT and agree with the findings.  Rest of the ILD question is all worked up she is got trace positive autoimmune antibodies.  Rheumatology referral is pending and an appointment is pending according to the patient.  The only PFTs from July 2018 which  suggest restriction.    Simple office walk 250 feet x  3 laps goal with forehead probe 05/25/2018   O2 used Room air  Number laps completed 3  Comments about pace slow  Resting Pulse Ox/HR 100% and 70/min  Final Pulse Ox/HR 94% and 92/min  Desaturated </= 88% no  Desaturated <= 3% points Yes, 6 points  Got Tachycardic >/= 90/min yes  Symptoms at end of test Mild dyspnea final lap  Miscellaneous comments x        IMPRESSION: 1. The appearance of the lungs is compatible with interstitial lung disease, with a spectrum of findings categorized as probable usual interstitial pneumonia (UIP) based on current ATS guidelines. Repeat high-resolution chest CT is suggested in 12 months to assess for temporal changes in the appearance of the lung parenchyma. 2. Aortic atherosclerosis. 3. Cirrhosis with stigmata of portal venous hypertension, including splenomegaly.  Aortic Atherosclerosis (ICD10-I70.0).   Electronically Signed   By: Vinnie Langton M.D.   On: 04/05/2018 18:24   Results  for Holly, Mueller (MRN [id number]) as of 05/25/2018 10:40  Ref. Range 10/06/2017 16:42  IgE (Immunoglobulin E), Serum Latest Ref Range: <OR=114 kU/L <2  ASPERGILLUS FUMIGATUS Latest Ref Range: NEGATIVE  NEGATIVE  [place] Serum Latest Ref Range: NEGATIVE  NEGATIVE  Anti Nuclear Antibody(ANA) Latest Ref Range: NEGATIVE  POSITIVE (A)  ANA Pattern 1 Unknown SPECKLED (A)  ANA Titer 1 Latest Units: titer 3:81 (H)  Cyclic Citrullin Peptide Ab Latest Units: UNITS <16  ds DNA Ab Latest Units: IU/mL 2  RA Latex Turbid. Latest Ref Range: <14 IU/mL <14  ENA SM Ab Ser-aCnc Latest Ref Range: <1.0 NEG AI <1.0 NEG  SSA (Ro) (ENA) Antibody, IgG Latest Ref Range: <1.0 NEG AI >8.0 POS (A)  SSB (La) (ENA) Antibody, IgG Latest Ref Range: <1.0 NEG AI <1.0 NEG  Scleroderma (Scl-70) (ENA) Antibody, IgG Latest Ref Range: <1.0 NEG AI <1.0 NEG  SM/RNP Latest Ref Range: <1.0 NEG AI <1.0 NEG     Results for Holly, Mueller (MRN [id number]) as of 05/25/2018 10:40  Ref. Range 11/13/2016 11:21  FVC-Pre Latest Units: L 1.80  FVC-%Pred-Pre Latest Units: % 48    Cirrhosis labs - Results for Holly, Mueller (MRN [id number]) as of 05/25/2018 12:00  Ref. Range 04/26/2018 23:47  Platelets Latest Ref Range: 150 - 400 K/uL 51 (L)       ROS - per HPI     has a past medical history of Arthritis, Diabetes mellitus without complication ([place]), Edentulous, Epilepsy ([place]) (in 1972), Fever (06/2017), GERD (gastroesophageal reflux disease) (2008), Headache(784.0), Hypothyroidism (2008), Lower esophageal ring (08/18/2013), Seasonal allergies (2003), and Shortness of breath.   reports that she quit smoking about 23 years ago. Her smoking use included cigarettes. She smoked 0.25 packs per day. She has never used smokeless tobacco.  Past Surgical History:  Procedure Laterality Date  . BALLOON DILATION N/A 10/24/2013   Procedure: BALLOON DILATION;  Surgeon: Gatha Mayer, MD;  Location: [facility];  Service: Endoscopy;  Laterality: N/A;  . CESAREAN SECTION     x2  . DENTAL SURGERY     multiple extractions 3'15  . ESOPHAGOGASTRODUODENOSCOPY N/A 10/24/2013   Procedure: ESOPHAGOGASTRODUODENOSCOPY (EGD);  Surgeon: Gatha Mayer, MD;  Location: [facility];  Service: Endoscopy;  Laterality: N/A;  . LIVER BIOPSY  06/30/2012   Procedure: LIVER BIOPSY;  Surgeon: Shann Medal, MD;  Location: [facility];  Service: General;;  . NOVASURE ABLATION    . SHOULDER ARTHROSCOPY Right 2011  . UMBILICAL HERNIA REPAIR N/A 06/30/2012   Procedure: remove umbilicus;  Surgeon: Shann Medal, MD;  Location: [facility];  Service: General;  Laterality: N/A;  . VENTRAL HERNIA REPAIR N/A 06/30/2012   Procedure: LAPAROSCOPIC VENTRAL HERNIA;  Surgeon: Shann Medal, MD;  Location: [facility];  Service: General;  Laterality: N/A;  With Mesh  . WISDOM TOOTH EXTRACTION      Allergies  Allergen Reactions  . Aspirin Nausea Only    Upset stomach     Immunization History  Administered Date(s) Administered  . Pneumococcal Polysaccharide-23 08/26/2011, 09/12/2017  . Td 05/11/2006    Family History  Problem Relation Age of Onset  . Hypertension Mother   . Diabetes Mother   . Lung cancer Father   . Stroke Father   . Diabetes Sister   . Hypertension Sister      Current Outpatient Medications:  .  ACCU-CHEK SOFTCLIX LANCETS lancets, USE UP TO 4 TIMES DAILY AS DIRECTED, Disp: 100 each, Rfl: 0 .  albuterol (PROAIR HFA) 108 (90 Base) MCG/ACT inhaler, INHALE 2 PUFFS INTO THE LUNGS EVERY 6 HOURS AS NEEDED FOR SHORTNESS OF BREATH, Disp: 8.5 g, Rfl: 5 .  albuterol (PROVENTIL) (2.5 MG/3ML) 0.083% nebulizer solution, Take 3 mLs (2.5 mg total) by nebulization every 6 (six) hours as needed for wheezing or shortness of breath., Disp: 75 mL, Rfl: 2 .  blood glucose meter kit and supplies KIT, Dispense based on patient and insurance preference. Use up to four times daily as directed. (FOR ICD-9 250.00, 250.01)., Disp: 1 each, Rfl: 0 .  cetirizine (ZYRTEC) 10 MG tablet, Take 1 tablet (10 mg total) by mouth daily., Disp: 30 tablet, Rfl: 11 .  fluticasone (FLONASE) 50 MCG/ACT nasal spray, Place 2 sprays into both nostrils daily., Disp: , Rfl:  .  furosemide (LASIX) 40 MG tablet, Take 1 tablet (40 mg total) by mouth daily., Disp: 30 tablet, Rfl: 3 .  glucose blood (ACCU-CHEK AVIVA PLUS) test strip, USE UP TO 4 TIMES DAILY AS DIRECTED, Disp: 100 each, Rfl: 0 .  ibuprofen (ADVIL,MOTRIN) 200 MG tablet, Take 600 mg by mouth every 6 (six) hours as needed for moderate pain., Disp: , Rfl:  .  levothyroxine (SYNTHROID, LEVOTHROID) 125 MCG tablet, Take 2 tablets (250 mcg total) by mouth daily., Disp: 60 tablet, Rfl: 0 .  metFORMIN (GLUCOPHAGE) 500 MG tablet, Take 1 tablet (500 mg total) by mouth 2 (two) times daily with a meal., Disp: 180 tablet, Rfl: 2 .  pantoprazole (PROTONIX) 40 MG tablet, Take 1 tablet (40 mg total) by mouth 2 (two) times daily before a  meal., Disp: 60 tablet, Rfl: 3 .  Mometasone Furoate (ASMANEX HFA) 200 MCG/ACT AERO, Inhale 2 puffs into the lungs 2 (two) times daily., Disp: 1 Inhaler, Rfl: 0 .  ursodiol (ACTIGALL) 500 MG tablet, Take 1 tablet (500 mg total) by mouth 2 (two) times daily. (Patient not taking: Reported on 05/25/2018), Disp: 180 tablet, Rfl: 3      Objective:   Vitals:   05/25/18 1126  BP: 120/68  Pulse: 70  SpO2: 98%  Weight: 241 lb (109.3 kg)  Height: _0  (1.676 m)    Estimated body mass index is 38.9 kg/m as calculated from the following:   Height as of this encounter: _1  (1.676 m).   Weight as of this encounter: 241 lb (109.3 kg).  _2 @  [employer]   05/25/18 1126  Weight: 241 lb (109.3 kg)     Physical Exam  General Appearance:    Alert, cooperative, no distress, appears stated age - looks older, Deconditioned looking - yes , OBESE  - yes, Sitting on Wheelchair -  no  Head:    Normocephalic, without obvious abnormality, atraumatic  Eyes:    PERRL, conjunctiva/corneas clear,  Ears:    Normal TM's and external ear canals, both ears  Nose:   Nares normal, septum midline, mucosa normal, no drainage    or sinus tenderness. OXYGEN ON  - no . Patient is @ ra   Throat:   Lips, mucosa, and tongue normal; teeth and gums normal. Cyanosis on lips - no  Neck:   Supple, symmetrical, trachea midline, no adenopathy;    thyroid:  no enlargement/tenderness/nodules; no carotid   bruit or JVD  Back:     Symmetric, no curvature, ROM normal, no CVA tenderness  Lungs:     Distress - no , Wheeze no, Barrell Chest - no, Purse lip breathing - no, Crackles -bilateral bibasal crackles with craniocaudal  gradient predominant at the base and limited to the base  Chest Wall:    No tenderness or deformity.    Heart:    Regular rate and rhythm, S1 and S2 normal, no rub   or gallop, Murmur - no  Breast Exam:    NOT DONE  Abdomen:     Soft, non-tender, bowel sounds active all four quadrants,    no  masses, no organomegaly. Visceral obesity - no  Genitalia:   NOT DONE  Rectal:   NOT DONE  Extremities:   Extremities - normal, Has Cane - no, Clubbing - YES YES, Edema - no  Pulses:   2+ and symmetric all extremities  Skin:   Stigmata of Connective Tissue Disease - STIGMATA of CONNECTIVE TISSUE DISEASE  - Distal digital fissuring (ie, "mechanic hands") - no - Distal digital tip ulceration -no -Inflammatory arthritis or polyarticular morning joint stiffness ?60 minutes - no - Palmar telangiectasia - no - Raynaud phenomenon - ?? - Unexplained digital edema - no - Unexplained fixed rash on the digital extensor surfaces (Gottron's sign) - no ... - Deformities of RA - no - Scleroderma  - no - Malar Rash -  No - CHEST WITH SPIDER NEVII +   Lymph nodes:   Cervical, supraclavicular, and axillary nodes normal  Psychiatric:  Neurologic:   Pleasant - yes, Anxious - yes, Flat affect - yes  CAm-ICU - neg, Alert and Oriented x 3 - yes, Moves all 4s - yes, Speech - normal, Cognition - intact           Assessment:       ICD-10-CM   1. Interstitial pulmonary disease (HCC) J84.9 Pulmonary function test    Pulse oximetry, overnight  2. Cough R05 Nitric oxide       Plan:     Patient Instructions     ICD-10-CM   1. Interstitial pulmonary disease ([place]) J84.9   2. Cough R05      You do have pulmonary fibrosis but the variety and type is not known Cough likely from fibrosis   PLan  for fibrosis:   - will discuss at case conference in feb 2020  - will take another opinion from another radiologist  - based on this need to figure out best biopsy (I think you definitely need biopsy) method - highest yield with lowest risk esp with lower platelets   - do spirometry and dlco in 5 weeks   - do ONO test on room air at sleep anytime next few weeks  For cough  - likely due to fibrosis but with allergy history - might be worth re-challenge with inhaler sample  - take asmanex 2 puff  twice daily   Followup ILD clinic  In 5 weeks   > 50% of this > 40 min visit spent in face to face counseling or/and coordination of care - by this undersigned MD - Dr Brand Males. This includes one or more of the following documented above: discussion of test results, diagnostic or treatment recommendations, prognosis, risks and benefits of management options, instructions, education, compliance or risk-factor reduction    SIGNATURE    Dr. Brand Males, M.D., F.C.C.P,  Pulmonary and Critical Care Medicine Staff Physician, [place] Director - Interstitial Lung Disease  Program  Pulmonary [place] at [place], [place], [address]  Pager: [phone], If no answer or between  15:00h - 7:00h: call [phone] Telephone:  [phone]  12:55 PM 05/25/2018

## 2018-06-16 ENCOUNTER — Telehealth: Payer: Self-pay | Admitting: Internal Medicine

## 2018-06-16 NOTE — Telephone Encounter (Signed)
Called and spoke with Patient.  She is wanting to know when Dr Chase Caller is going to meet to discuss case conference.  Per last OV-  PLan  for fibrosis:              - will discuss at case conference in feb 2020             - will take another opinion from another radiologist             - based on this need to figure out best biopsy (I think you definitely need biopsy) method - highest yield with lowest risk esp with lower platelets  Explained per last note it would be 06/2018.  She wanted to know when she should expect to hear something.  Dr Chase Caller, please advise

## 2018-06-16 NOTE — Telephone Encounter (Signed)
Called and spoke with Patient.  She stated that Dr Chase Caller was wanting her to have a sleep test.  ONO ordered 05/25/18.  PCC's please advise on ONO

## 2018-06-16 NOTE — Telephone Encounter (Signed)
Patient called requesting a call back about her sleep study she thinks it is called stirometry//fmb

## 2018-06-16 NOTE — Telephone Encounter (Signed)
Called [facility] & spoke to [place].  She states they have tried to call pt to set up ono but phone #'s they had on file for pt are invalid.  I gave her phone # we have on file for pt  - [phone].  She states they did not have that #.  She is going to send e-mail to Respiratory Therapy Dept & have them to call pt.  I called pt & made her aware they will be calling her.  Nothing further needed.

## 2018-06-16 NOTE — Telephone Encounter (Signed)
Conference is next week and she is on the list to be dsicsused. Expect to hear from [place] by end of next week and definitely more discussion at [place] 07/12/2018  Has she seen rheumatology?   MR

## 2018-06-16 NOTE — Telephone Encounter (Signed)
She states she has not seen rheumatology due to not have insurance.

## 2018-06-21 ENCOUNTER — Encounter: Payer: Self-pay | Admitting: Internal Medicine

## 2018-06-21 NOTE — Progress Notes (Signed)
Interstitial Lung Disease Multidisciplinary Conference   Holly Mueller    MRN [id number]    DOB 16-May-1965  Primary Care Physician:[place], Curt Bears, MD  Referring Physician: Pauletta Browns  Time of Conference: 7.30am- 8.30am Date of conference: 06/21/2018 Location of Conference: -  [place]; typically 2nd Tuesday of each month  Participating Pulmonary: Dr. Brand Males, MD - yes,  Dr Marshell Garfinkel, MD - yes Pathology: Dr Jaquita Folds, MD - yes Radiology: Dr Salvatore Marvel MD - yes, Dr Vinnie Langton MD - no,  Dr Lorin Picket, MD - no Others: x  Brief History: Has interstitial lung disease with positive serology of SSA and ANA 1: 80  Serology:   MDD discussion of CT scan   - Date or time period of scan: High-resolution CT chest done November 26, 2019In comparison to CT angiogram chest July 2018 - Features mentioned:  -   Zonal predominance -craniocaudal gradient present with predominant findings in the lung base  - subpleural findings -yes - Traction Bronchiectasis -yes - Reticulation -yes - Honeycombing -no - Air trapping -no - Ground Glass Opacities -no - Emphysema -no - Miscellaneous Comments -there is progression some since July 2018 - What is the final conclusion per 2018 ATS/Fleischner Criteria -probable UIP  Pathology discussion of biopsy no pathology:  PFTs: c  Labs: x*  MDD Impression/Recs:   -Probable UIP and with clubbing this could be IPF/UIP.  However being female and age below 19 -this could be non-IPF ILD especially IPAF.  Consider BAL +/- biopsy carefully weighing the risk especially with low platelets. Atlenatively consider anti-fibrotic Rx   Time Spent in preparation and discussion: 40 minutes   SIGNATURE    Dr. Brand Males, M.D., F.C.C.P,  Pulmonary and Critical Care Medicine Staff Physician, [place] Director - Interstitial Lung Disease  Program  Pulmonary [place] at [place], [place], [address]  Pager: [phone], If no answer or between  15:00h - 7:00h: call [phone] Telephone: [phone]  5:59 PM 06/21/2018 ...................................................................................................................Marland Kitchen  References: Diagnosis of Idiopathic Pulmonary Fibrosis. An Official ATS/ERS/JRS/ALAT Clinical Practice Guideline. Raghu G et al, [place]. 2018 Sep 1;198(5):e44-[id number].   IPF Suspected   Histopath ology Pattern      UIP  Probable UIP  Indeterminate for  UIP  Alternative  diagnosis    UIP  IPF  IPF  IPF  Non-IPF dx   HRCT   Probabe UIP  IPF  IPF  IPF (Likely)**  Non-IPF dx  Pattern  Indeterminate for UIP  IPF  IPF (Likely)**  Indeterminate  for IPF**  Non-IPF dx    Alternative diagnosis  IPF (Likely)**/ non-IPF dx  Non-IPF dx  Non-IPF dx  Non-IPF dx     Idiopathic pulmonary fibrosis diagnosis based upon HRCT and Biopsy paterns.  ** IPF is the likely diagnosis when any of following features are present:  . Moderate-to-severe traction bronchiectasis/bronchiolectasis (defined as mild traction bronchiectasis/bronchiolectasis in four or more lobes including the lingual as a lobe, or moderate to severe traction bronchiectasis in two or more lobes) in a man over age 33 years or in a woman over age 17 years . Extensive (>30%) reticulation on HRCT and an age >70 years  . Increased neutrophils and/or absence of lymphocytosis in BAL fluid  . Multidisciplinary discussion reaches a confident diagnosis of IPF.   **Indeterminate for IPF  . Without an adequate biopsy  is unlikely to be IPF  . With an adequate biopsy may be reclassified to a more specific diagnosis after multidisciplinary discussion and/or additional consultation.   dx = diagnosis; HRCT = high-resolution computed tomography; IPF = idiopathic pulmonary fibrosis; UIP = usual interstitial  pneumonia.

## 2018-06-21 NOTE — Telephone Encounter (Signed)
Discussed at MDD conferecne 06/21/2018 - radiology is probable UIP and thre is progression snice 2018  Plan Keep 07/12/2018 appt with me in ILD clinic - will likely recommend bronch/bal +/- mini biopsy (but might avoid bx due to chronic low platelets)  And also discuss Rx/mgmt plan - likely antifobrotic  Thanks  Mr

## 2018-06-27 ENCOUNTER — Encounter (HOSPITAL_COMMUNITY): Payer: Self-pay

## 2018-06-27 ENCOUNTER — Emergency Department (HOSPITAL_COMMUNITY)
Admission: EM | Admit: 2018-06-27 | Discharge: 2018-06-27 | Disposition: A | Discharge status: Home / Self Care | Payer: Medicaid Other | Attending: Emergency Medicine | Admitting: Emergency Medicine

## 2018-06-27 ENCOUNTER — Emergency Department (HOSPITAL_COMMUNITY): Payer: Medicaid Other

## 2018-06-27 ENCOUNTER — Other Ambulatory Visit: Payer: Self-pay

## 2018-06-27 DIAGNOSIS — Z87891 Personal history of nicotine dependence: Secondary | ICD-10-CM | POA: Insufficient documentation

## 2018-06-27 DIAGNOSIS — M7918 Myalgia, other site: Secondary | ICD-10-CM | POA: Insufficient documentation

## 2018-06-27 DIAGNOSIS — R0989 Other specified symptoms and signs involving the circulatory and respiratory systems: Secondary | ICD-10-CM | POA: Insufficient documentation

## 2018-06-27 DIAGNOSIS — E039 Hypothyroidism, unspecified: Secondary | ICD-10-CM | POA: Insufficient documentation

## 2018-06-27 DIAGNOSIS — Z7984 Long term (current) use of oral hypoglycemic drugs: Secondary | ICD-10-CM | POA: Insufficient documentation

## 2018-06-27 DIAGNOSIS — Z79899 Other long term (current) drug therapy: Secondary | ICD-10-CM | POA: Insufficient documentation

## 2018-06-27 DIAGNOSIS — R069 Unspecified abnormalities of breathing: Secondary | ICD-10-CM | POA: Insufficient documentation

## 2018-06-27 DIAGNOSIS — J069 Acute upper respiratory infection, unspecified: Secondary | ICD-10-CM

## 2018-06-27 DIAGNOSIS — E119 Type 2 diabetes mellitus without complications: Secondary | ICD-10-CM | POA: Insufficient documentation

## 2018-06-27 LAB — CBC WITH DIFFERENTIAL/PLATELET
Abs Immature Granulocytes: 0.03 10*3/uL (ref 0.00–0.07)
BASOS PCT: 1 %
Basophils Absolute: 0 10*3/uL (ref 0.0–0.1)
Eosinophils Absolute: 0.1 10*3/uL (ref 0.0–0.5)
Eosinophils Relative: 2 %
HCT: 35.6 % — ABNORMAL LOW (ref 36.0–46.0)
Hemoglobin: 11.7 g/dL — ABNORMAL LOW (ref 12.0–15.0)
Immature Granulocytes: 1 %
Lymphocytes Relative: 16 %
Lymphs Abs: 0.4 10*3/uL — ABNORMAL LOW (ref 0.7–4.0)
MCH: 28.5 pg (ref 26.0–34.0)
MCHC: 32.9 g/dL (ref 30.0–36.0)
MCV: 86.6 fL (ref 80.0–100.0)
Monocytes Absolute: 0.2 10*3/uL (ref 0.1–1.0)
Monocytes Relative: 9 %
Neutro Abs: 1.8 10*3/uL (ref 1.7–7.7)
Neutrophils Relative %: 71 %
PLATELETS: 66 10*3/uL — AB (ref 150–400)
RBC: 4.11 MIL/uL (ref 3.87–5.11)
RDW: 15.4 % (ref 11.5–15.5)
WBC: 2.6 10*3/uL — ABNORMAL LOW (ref 4.0–10.5)
nRBC: 0 % (ref 0.0–0.2)

## 2018-06-27 LAB — COMPREHENSIVE METABOLIC PANEL
ALBUMIN: 3.1 g/dL — AB (ref 3.5–5.0)
ALT: 40 U/L (ref 0–44)
AST: 63 U/L — ABNORMAL HIGH (ref 15–41)
Alkaline Phosphatase: 125 U/L (ref 38–126)
Anion gap: 10 (ref 5–15)
BUN: 7 mg/dL (ref 6–20)
CO2: 17 mmol/L — ABNORMAL LOW (ref 22–32)
Calcium: 8.4 mg/dL — ABNORMAL LOW (ref 8.9–10.3)
Chloride: 107 mmol/L (ref 98–111)
Creatinine, Ser: 0.63 mg/dL (ref 0.44–1.00)
GFR calc Af Amer: >60 mL/min (ref >60)
GFR calc non Af Amer: >60 mL/min (ref >60)
Glucose, Bld: 114 mg/dL — ABNORMAL HIGH (ref 70–99)
POTASSIUM: 3.8 mmol/L (ref 3.5–5.1)
Sodium: 134 mmol/L — ABNORMAL LOW (ref 135–145)
Total Bilirubin: 2.2 mg/dL — ABNORMAL HIGH (ref 0.3–1.2)
Total Protein: 8.1 g/dL (ref 6.5–8.1)

## 2018-06-27 LAB — INFLUENZA PANEL BY PCR (TYPE A & B)
INFLAPCR: NEGATIVE
Influenza B By PCR: NEGATIVE

## 2018-06-27 MED ORDER — AMOXICILLIN-POT CLAVULANATE 875-125 MG PO TABS: 1.0000 | ORAL_TABLET | Freq: Two times a day (BID) | ORAL | 0 refills | Status: DC

## 2018-06-27 MED ORDER — SODIUM CHLORIDE 0.9 % IV BOLUS
500.0000 mL | Freq: Once | INTRAVENOUS | Status: AC
  Administered 2018-06-27: 500 mL via INTRAVENOUS

## 2018-06-27 MED ORDER — ACETAMINOPHEN 325 MG PO TABS
650.0000 mg | ORAL_TABLET | Freq: Once | ORAL | Status: AC
  Administered 2018-06-27: 650 mg via ORAL
  Filled 2018-06-27: qty 2

## 2018-06-27 NOTE — ED Notes (Signed)
ED Provider at bedside. 

## 2018-06-27 NOTE — ED Triage Notes (Signed)
[facility]- pt has been experiencing flu like symptoms for several days. She reports body aches, cough, and fever. Has been using OTC medications. Has been tolerating PO intake.   160/100 100bpm cbg 106 Temp 99

## 2018-06-27 NOTE — ED Provider Notes (Signed)
[facility] EMERGENCY DEPARTMENT Provider Note   CSN: [id number] Arrival date & time: 06/27/18  1434    History   Chief Complaint Chief Complaint  Patient presents with  . URI    HPI Holly Mueller is a 53 y.o. female with a PMH of Type 2 Diabetes and Interstitial Lung Disease presenting with a fever, productive cough, congestion, rhinorrhea, and body aches for 2 weeks. Patient states she has tried Nyquil, tylenol, and cough drops with partial relief. Patient reports sick exposures at home by her kids. Patient denies getting the flu shot. Patient reports she has been able to eat and drink fluids without difficulty. Patient reports a few episodes of diarrhea and vomiting a few days ago, but states symptoms have improved. Patient denies abdominal pain. Patient denies chest pain or shortness of breath. Patient states pulmonologist is Dr. Chase Caller and states she saw him in 05/2018.    HPI  Past Medical History:  Diagnosis Date  . Arthritis    bursitis left hip flares-not an issue  . Diabetes mellitus without complication ([place])   . Edentulous    10-19-13 at present  . Epilepsy ([place]) in 1972   No seizures since 1972. Previously treated with phenobarbital.   . Fever 06/2017  . GERD (gastroesophageal reflux disease) 2008  . Headache(784.0)    hx of migraines   . Hypothyroidism 2008  . Lower esophageal ring 08/18/2013  . Seasonal allergies 2003  . Shortness of breath     Patient Active Problem List   Diagnosis Date Noted  . Insurance coverage problems 04/04/2018  . ILD (interstitial lung disease) ([place]) 03/23/2018  . Food insecurity 10/15/2017  . Hyperglycemia 10/06/2017  . Ectatic aorta ([place]) 09/11/2017  . Left leg pain   . Type 2 diabetes mellitus without complication, without long-term current use of insulin ([place])   . Cirrhosis of liver without ascites ([place]) 11/17/2016  . Thrombocytopenia ([place]) 11/17/2016  . Leucopenia 11/17/2016  . Bicytopenia 10/24/2016   . Epistaxis 10/23/2016  . GERD (gastroesophageal reflux disease) 10/23/2016  . Cough 12/09/2015  . Chronic pain of left thumb 10/09/2015  . Precordial chest pain 08/03/2015  . Bursitis of left hip 09/14/2013  . Lower esophageal ring 08/18/2013  . DOE (dyspnea on exertion) 08/17/2013  . Ganglion cyst of wrist 08/24/2012  . Right hip pain 08/23/2012  . Umbilical hernia 15/40/0867  . Healthcare maintenance 02/14/2012  . Osteoarthritis 02/12/2012  . Allergic rhinitis 08/26/2011  . Obesity 07/08/2010  . SEIZURES, HX OF 12/12/2009  . DENTAL CARIES 12/18/2008  . DEPRESSION 07/26/2008  . MIGRAINE HEADACHE 11/15/2007  . Hypothyroidism 10/12/2006  . Restrictive lung disease 07/30/2006    Past Surgical History:  Procedure Laterality Date  . BALLOON DILATION N/A 10/24/2013   Procedure: BALLOON DILATION;  Surgeon: Gatha Mayer, MD;  Location: [facility];  Service: Endoscopy;  Laterality: N/A;  . CESAREAN SECTION     x2  . DENTAL SURGERY     multiple extractions 3'15  . ESOPHAGOGASTRODUODENOSCOPY N/A 10/24/2013   Procedure: ESOPHAGOGASTRODUODENOSCOPY (EGD);  Surgeon: Gatha Mayer, MD;  Location: [facility];  Service: Endoscopy;  Laterality: N/A;  . LIVER BIOPSY  06/30/2012   Procedure: LIVER BIOPSY;  Surgeon: Shann Medal, MD;  Location: [facility];  Service: General;;  . NOVASURE ABLATION    . SHOULDER ARTHROSCOPY Right 2011  . UMBILICAL HERNIA REPAIR N/A 06/30/2012   Procedure: remove umbilicus;  Surgeon: Shann Medal, MD;  Location: [facility];  Service:  General;  Laterality: N/A;  . VENTRAL HERNIA REPAIR N/A 06/30/2012   Procedure: LAPAROSCOPIC VENTRAL HERNIA;  Surgeon: Shann Medal, MD;  Location: [facility];  Service: General;  Laterality: N/A;  With Mesh  . WISDOM TOOTH EXTRACTION       OB History   No obstetric history on file.      Home Medications    Prior to Admission medications   Medication Sig Start Date End Date Taking? Authorizing Provider  ACCU-CHEK SOFTCLIX  LANCETS lancets USE UP TO 4 TIMES DAILY AS DIRECTED 04/25/18   Rory Percy, DO  albuterol (PROAIR HFA) 108 (90 Base) MCG/ACT inhaler INHALE 2 PUFFS INTO THE LUNGS EVERY 6 HOURS AS NEEDED FOR SHORTNESS OF BREATH 04/25/18   Rory Percy, DO  albuterol (PROVENTIL) (2.5 MG/3ML) 0.083% nebulizer solution Take 3 mLs (2.5 mg total) by nebulization every 6 (six) hours as needed for wheezing or shortness of breath. 04/25/18   Rory Percy, DO  blood glucose meter kit and supplies KIT Dispense based on patient and insurance preference. Use up to four times daily as directed. (FOR ICD-9 250.00, 250.01). 06/19/17   Rory Percy, DO  cetirizine (ZYRTEC) 10 MG tablet Take 1 tablet (10 mg total) by mouth daily. 01/27/17   [name], MD  fluticasone (FLONASE) 50 MCG/ACT nasal spray Place 2 sprays into both nostrils daily.    [provider]  furosemide (LASIX) 40 MG tablet Take 1 tablet (40 mg total) by mouth daily. 03/07/18   Zenia Resides, MD  glucose blood (ACCU-CHEK AVIVA PLUS) test strip USE UP TO 4 TIMES DAILY AS DIRECTED 04/25/18   Rory Percy, DO  ibuprofen (ADVIL,MOTRIN) 200 MG tablet Take 600 mg by mouth every 6 (six) hours as needed for moderate pain.    [provider]  levothyroxine (SYNTHROID, LEVOTHROID) 125 MCG tablet Take 2 tablets (250 mcg total) by mouth daily. 01/05/18   Rory Percy, DO  metFORMIN (GLUCOPHAGE) 500 MG tablet Take 1 tablet (500 mg total) by mouth 2 (two) times daily with a meal. 03/04/18   Hensel, Jamal Collin, MD  Mometasone Furoate [facility] HFA) 200 MCG/ACT AERO Inhale 2 puffs into the lungs 2 (two) times daily. 05/25/18   Brand Males, MD  pantoprazole (PROTONIX) 40 MG tablet Take 1 tablet (40 mg total) by mouth 2 (two) times daily before a meal. 03/22/18   Elsie Stain, MD  ursodiol (ACTIGALL) 500 MG tablet Take 1 tablet (500 mg total) by mouth 2 (two) times daily. Patient not taking: Reported on 05/25/2018 03/03/18   Zenia Resides, MD    Family History Family History  Problem Relation Age of Onset  . Hypertension Mother   . Diabetes Mother   . Lung cancer Father   . Stroke Father   . Diabetes Sister   . Hypertension Sister     Social History Social History   Tobacco Use  . Smoking status: Former Smoker    Packs/day: 0.25    Types: Cigarettes    Last attempt to quit: 1997    Years since quitting: 23.1  . Smokeless tobacco: Never Used  . Tobacco comment: social  Substance Use Topics  . Alcohol use: Yes    Comment: occ  . Drug use: No     Allergies   Aspirin   Review of Systems Review of Systems  Constitutional: Positive for fatigue and fever. Negative for activity change and appetite change.  HENT: Positive for congestion and rhinorrhea. Negative for ear pain,  postnasal drip and sore throat.   Eyes: Negative for pain, redness and itching.  Respiratory: Positive for cough. Negative for shortness of breath.   Cardiovascular: Negative for chest pain.  Gastrointestinal: Positive for diarrhea and vomiting. Negative for abdominal pain and nausea.  Genitourinary: Negative for dysuria.  Musculoskeletal: Positive for myalgias. Negative for neck pain and neck stiffness.  Skin: Negative for rash.  Allergic/Immunologic: Negative for environmental allergies.  Neurological: Negative for dizziness, weakness and headaches.    Physical Exam Updated Vital Signs BP 120/74 (BP Location: Right Arm)   Pulse 96   Temp 99.7 F (37.6 C) (Oral)   Resp 16   Ht 5' 5" (1.651 m)   Wt 115.7 kg   SpO2 97%   BMI 42.43 kg/m   Physical Exam Vitals signs and nursing note reviewed.  Constitutional:      General: She is not in acute distress.    Appearance: She is well-developed. She is not diaphoretic.  HENT:     Head: Normocephalic and atraumatic.     Right Ear: Tympanic membrane, ear canal and external ear normal. No middle ear effusion.     Left Ear: Tympanic membrane, ear canal and external ear  normal.  No middle ear effusion.     Nose: Mucosal edema, congestion and rhinorrhea present.     Right Sinus: Maxillary sinus tenderness present. No frontal sinus tenderness.     Left Sinus: Maxillary sinus tenderness present. No frontal sinus tenderness.     Mouth/Throat:     Mouth: Mucous membranes are moist.     Pharynx: Uvula midline. No oropharyngeal exudate or posterior oropharyngeal erythema.  Eyes:     General:        Right eye: No discharge.        Left eye: No discharge.     Conjunctiva/sclera: Conjunctivae normal.  Neck:     Musculoskeletal: Normal range of motion and neck supple.  Cardiovascular:     Rate and Rhythm: Normal rate and regular rhythm.     Heart sounds: Normal heart sounds. No murmur. No friction rub. No gallop.   Pulmonary:     Effort: Pulmonary effort is normal. No respiratory distress.     Breath sounds: Normal breath sounds. No wheezing or rales.  Abdominal:     Palpations: Abdomen is soft.     Tenderness: There is no abdominal tenderness.  Musculoskeletal: Normal range of motion.  Lymphadenopathy:     Cervical: No cervical adenopathy.  Skin:    Findings: No rash.  Neurological:     Mental Status: She is alert and oriented to person, place, and time.      ED Treatments / Results  Labs (all labs ordered are listed, but only abnormal results are displayed) Labs Reviewed  COMPREHENSIVE METABOLIC PANEL - Abnormal; Notable for the following components:      Result Value   Sodium 134 (*)    CO2 17 (*)    Glucose, Bld 114 (*)    Calcium 8.4 (*)    Albumin 3.1 (*)    AST 63 (*)    Total Bilirubin 2.2 (*)    All other components within normal limits  CBC WITH DIFFERENTIAL/PLATELET - Abnormal; Notable for the following components:   WBC 2.6 (*)    Hemoglobin 11.7 (*)    HCT 35.6 (*)    Platelets 66 (*)    Lymphs Abs 0.4 (*)    All other components within normal limits  INFLUENZA PANEL BY PCR (  TYPE A & B)    EKG None  Radiology Dg Chest  2 View  Result Date: 06/27/2018 CLINICAL DATA:  Body aches, cough and fever. EXAM: CHEST - 2 VIEW COMPARISON:  04/26/2018 FINDINGS: The cardiac silhouette, mediastinal and hilar contours are within normal limits and stable. There are chronic underlying interstitial changes most notably at the lung bases. I do not see a superimposed infiltrate but there could be superimposed bronchitis. No pleural effusions or worrisome pulmonary lesions. The bony thorax is intact. IMPRESSION: 1. Chronic underlying interstitial lung disease. 2. No definite superimposed pneumonia or effusion. Could not exclude superimposed bronchitis. Electronically Signed   By: Marijo Sanes M.D.   On: 06/27/2018 15:03    Procedures Procedures (including critical care time)  Medications Ordered in ED Medications  sodium chloride 0.9 % bolus 500 mL (500 mLs Intravenous New Bag/Given 06/27/18 1757)  acetaminophen (TYLENOL) tablet 650 mg (650 mg Oral Given 06/27/18 1719)     Initial Impression / Assessment and Plan / ED Course  I have reviewed the triage vital signs and the nursing notes.  Pertinent labs & imaging results that were available during my care of the patient were reviewed by me and considered in my medical decision making (see chart for details).  Clinical Course as of Jun 27 1806  Mon Jun 27, 2018  1621 CXR reveals chronic underlying interstitial lung disease. No definite superimposed pneumonia or effusion. Could not exclude superimposed bronchitis.    DG Chest 2 View [AH]  1724 Leukopenia noted at 2.6. Improved from 1.7. Patient aware and states she is being followed by pulmonology.  WBC(!): 2.6 [AH]  1725 Low hemoglobin noted at 11.7. Improved from 10.5 two months ago.  Hemoglobin(!): 11.7 [AH]  1805 Low platelets noted at 66. Improved from previous visit. Patient denies any bleeding at this time.  Platelets(!): 66 [AH]    Clinical Course User Index [AH] Arville Lime, PA-C      Patient presents  with fever, cough, congestion, and body aches. Pt CXR negative for acute infiltrate. Patients symptoms are consistent with URI, likely bacterial etiology due to the length of symptoms. Flu test is negative. Provided IVF in the ER. Symptoms are flu-like symptoms and discussed Tamiflu. Patient requests not to use Tamiflu at this time. Will prescribe antibiotics due to concern for bacterial sinusitis.  Discussed return precautions with patient. Verbalizes understanding and is agreeable with plan. Pt is hemodynamically stable & in NAD prior to dc.   Final Clinical Impressions(s) / ED Diagnoses   Final diagnoses:  URI with cough and congestion    ED Discharge Orders    None       Arville Lime, [place] 06/27/18 1823    Margette Fast, MD 06/28/18 [phone]

## 2018-06-27 NOTE — ED Notes (Signed)
Patient verbalizes understanding of discharge instructions. Opportunity for questioning and answers were provided. Armband removed by staff, pt discharged from ED ambulatory.

## 2018-06-27 NOTE — Discharge Instructions (Addendum)
You have been seen today for congestion, cough, and body aches. Please read and follow all provided instructions.   1. Medications: Augmentin (antibiotic), usual home medications 2. Treatment: rest, drink plenty of fluids 3. Follow Up: Please follow up with your primary doctor in 2 days for discussion of your diagnoses and further evaluation after today's visit; if you do not have a primary care doctor use the resource guide provided to find one; Please return to the ER for any new or worsening symptoms. Please obtain all of your results from medical records or have your doctors office obtain the results - share them with your doctor - you should be seen at your doctors office. Call today to arrange your follow up.   Take medications as prescribed. Please review all of the medicines and only take them if you do not have an allergy to them. Return to the emergency room for worsening condition or new concerning symptoms. Follow up with your regular doctor. If you don't have a regular doctor use one of the numbers below to establish a primary care doctor.  Please be aware that if you are taking birth control pills, taking other prescriptions, ESPECIALLY ANTIBIOTICS may make the birth control ineffective - if this is the case, either do not engage in sexual activity or use alternative methods of birth control such as condoms until you have finished the medicine and your family doctor says it is OK to restart them. If you are on a blood thinner such as COUMADIN, be aware that any other medicine that you take may cause the coumadin to either work too much, or not enough - you should have your coumadin level rechecked in next 7 days if this is the case.  ?  It is also a possibility that you have an allergic reaction to any of the medicines that you have been prescribed - Everybody reacts differently to medications and while MOST people have no trouble with most medicines, you may have a reaction such as nausea,  vomiting, rash, swelling, shortness of breath. If this is the case, please stop taking the medicine immediately and contact your physician.  ?  You should return to the ER if you develop severe or worsening symptoms.   Emergency Department Resource Guide 1) Find a Doctor and Pay Out of Pocket Although you won't have to find out who is covered by your insurance plan, it is a good idea to ask around and get recommendations. You will then need to call the office and see if the doctor you have chosen will accept you as a new patient and what types of options they offer for patients who are self-pay. Some doctors offer discounts or will set up payment plans for their patients who do not have insurance, but you will need to ask so you aren't surprised when you get to your appointment.  2) Contact Your Local Health Department Not all health departments have doctors that can see patients for sick visits, but many do, so it is worth a call to see if yours does. If you don't know where your local health department is, you can check in your phone book. The CDC also has a tool to help you locate your state's health department, and many state websites also have listings of all of their local health departments.  3) Find a [facility] If your illness is not likely to be very severe or complicated, you may want to try a walk in clinic. These  are popping up all over the country in pharmacies, drugstores, and shopping centers. They're usually staffed by nurse practitioners or physician assistants that have been trained to treat common illnesses and complaints. They're usually fairly quick and inexpensive. However, if you have serious medical issues or chronic medical problems, these are probably not your best option.  No Primary Care Doctor: Call Health Connect at  [phone] - they can help you locate a primary care doctor that  accepts your insurance, provides certain services, etc. Physician Referral [phone]  Emergency Department Resource Guide 1) Find a Doctor and Pay Out of Pocket Although you won't have to find out who is covered by your insurance plan, it is a good idea to ask around and get recommendations. You will then need to call the office and see if the doctor you have chosen will accept you as a new patient and what types of options they offer for patients who are self-pay. Some doctors offer discounts or will set up payment plans for their patients who do not have insurance, but you will need to ask so you aren't surprised when you get to your appointment.  2) Contact Your Local Health Department Not all health departments have doctors that can see patients for sick visits, but many do, so it is worth a call to see if yours does. If you don't know where your local health department is, you can check in your phone book. The CDC also has a tool to help you locate your state's health department, and many state websites also have listings of all of their local health departments.  3) Find a [facility] If your illness is not likely to be very severe or complicated, you may want to try a walk in clinic. These are popping up all over the country in pharmacies, drugstores, and shopping centers. They're usually staffed by nurse practitioners or physician assistants that have been trained to treat common illnesses and complaints. They're usually fairly quick and inexpensive. However, if you have serious medical issues or chronic medical problems, these are probably not your best option.  No Primary Care Doctor: Call Health Connect at  [phone] - they can help you locate a primary care doctor that  accepts your insurance, provides certain services, etc. Physician Referral Service- [phone]  Chronic Pain Problems: Organization         Address  Phone   Notes  [facility]  [phone] Patients need to be referred by their primary care doctor.    Medication Assistance: Organization         Address  Phone   Notes  [facility] Medication [facility] [address] [place], [place] [address] [phone] --Must be a resident of [facility] -- Must have NO insurance coverage whatsoever (no Medicaid/ Medicare, etc.) -- The pt. MUST have a primary care doctor that directs their care regularly and follows them in the community   [facility]  [phone]   [employer]  [phone]    Agencies that provide inexpensive medical care: Organization         Address  Phone   Notes  [place]  [phone]   [facility]    [phone]   [facility] [place], [place] [address] [phone]   [place] [address], [place] [phone]   Planned Parenthood    (  [phone]   [facility]    [phone]   [facility]  [address], [place] Phone:  [phone], Fax:  [phone] Hours of Operation:  9 am - 6 pm, M-F.  Also accepts Medicaid/Medicare and self-pay.  [facility] for [facility], [address], [place] Phone: [phone], Fax: [phone]. Hours of Operation:  8:30 am - 5:30 pm, M-F.  Also accepts Medicaid and self-pay.  [facility] [place] [address] [place] Phone: [phone]   [place], [place], [place] [phone], Ext. [phone] Mondays & Thursdays: 7-9 AM.  First 15 patients are seen on a first come, first serve basis.    [place] Providers:  Organization         Address  Phone   Notes  [facility] [address],  [phone] Also accepts self-pay patients.  [facility] [address], [place]  [phone]   [place], [address], [place] [phone]   [facility] [address] [place] [phone]   Lucianne Lei [address], [place]   [phone] Only accepts [place] [facility] [place] patients after they have their name applied to their card.   Self-Pay (no insurance) in [facility]:  Organization         Address  Phone   Notes  Sickle Cell Patients, [facility] [place] [phone]   [facility] [place] [phone]   [facility] [place]  [place], [place], [place] [phone]   [facility]/Dr. Osei-Bonsu  [address] [place] or [address], [place] [phone] Phone number for both [place] and [place] locations is the same.  Urgent [facility] and [facility] [address], [place] [phone]   [facility] [address] [place] or [address] [phone]   [facility] [address] [place] [phone], phone; [phone], fax Sees patients 1st and 3rd Saturday of every month.  Must not qualify for public or private insurance (i.e. Medicaid, Medicare, [place] Health Choice, Veterans' Benefits)  Household income should be no more than 200% of the poverty level The clinic cannot treat you if you are pregnant or think you are pregnant  Sexually transmitted diseases are not treated at the clinic.

## 2018-06-30 ENCOUNTER — Ambulatory Visit: Payer: Medicaid Other

## 2018-07-05 ENCOUNTER — Encounter (HOSPITAL_COMMUNITY): Payer: Self-pay | Admitting: Emergency Medicine

## 2018-07-05 ENCOUNTER — Emergency Department (HOSPITAL_COMMUNITY): Payer: Medicaid Other

## 2018-07-05 ENCOUNTER — Emergency Department (HOSPITAL_COMMUNITY)
Admission: EM | Admit: 2018-07-05 | Discharge: 2018-07-05 | Disposition: A | Discharge status: Home / Self Care | Payer: Medicaid Other | Attending: Emergency Medicine | Admitting: Emergency Medicine

## 2018-07-05 DIAGNOSIS — Z87891 Personal history of nicotine dependence: Secondary | ICD-10-CM | POA: Insufficient documentation

## 2018-07-05 DIAGNOSIS — Z7984 Long term (current) use of oral hypoglycemic drugs: Secondary | ICD-10-CM | POA: Insufficient documentation

## 2018-07-05 DIAGNOSIS — J069 Acute upper respiratory infection, unspecified: Secondary | ICD-10-CM

## 2018-07-05 DIAGNOSIS — E039 Hypothyroidism, unspecified: Secondary | ICD-10-CM | POA: Insufficient documentation

## 2018-07-05 DIAGNOSIS — E119 Type 2 diabetes mellitus without complications: Secondary | ICD-10-CM | POA: Insufficient documentation

## 2018-07-05 DIAGNOSIS — B9789 Other viral agents as the cause of diseases classified elsewhere: Secondary | ICD-10-CM

## 2018-07-05 DIAGNOSIS — Z79899 Other long term (current) drug therapy: Secondary | ICD-10-CM | POA: Insufficient documentation

## 2018-07-05 MED ORDER — KETOROLAC TROMETHAMINE 60 MG/2ML IM SOLN
15.0000 mg | Freq: Once | INTRAMUSCULAR | Status: AC
  Administered 2018-07-05: 15 mg via INTRAMUSCULAR
  Filled 2018-07-05: qty 2

## 2018-07-05 MED ORDER — IPRATROPIUM-ALBUTEROL 0.5-2.5 (3) MG/3ML IN SOLN: 3.0000 mL | RESPIRATORY_TRACT | Status: DC

## 2018-07-05 MED ORDER — HYDROCOD POLST-CPM POLST ER 10-8 MG/5ML PO SUER
5.0000 mL | Freq: Once | ORAL | Status: AC
  Administered 2018-07-05: 5 mL via ORAL
  Filled 2018-07-05: qty 5

## 2018-07-05 MED ORDER — IPRATROPIUM-ALBUTEROL 0.5-2.5 (3) MG/3ML IN SOLN
3.0000 mL | Freq: Once | RESPIRATORY_TRACT | Status: AC
  Administered 2018-07-05: 3 mL via RESPIRATORY_TRACT
  Filled 2018-07-05: qty 3

## 2018-07-05 MED ORDER — ALBUTEROL SULFATE (2.5 MG/3ML) 0.083% IN NEBU
5.0000 mg | INHALATION_SOLUTION | Freq: Once | RESPIRATORY_TRACT | Status: AC
  Administered 2018-07-05: 5 mg via RESPIRATORY_TRACT
  Filled 2018-07-05: qty 6

## 2018-07-05 MED ORDER — DEXAMETHASONE 4 MG PO TABS
10.0000 mg | ORAL_TABLET | Freq: Once | ORAL | Status: AC
  Administered 2018-07-05: 10 mg via ORAL
  Filled 2018-07-05: qty 3

## 2018-07-05 NOTE — ED Notes (Signed)
ED Provider at bedside. 

## 2018-07-05 NOTE — ED Triage Notes (Signed)
Pt seen here a week ago, is still taking abx, reports cough and SOB not getting better and unable to sleep. No fever. A&O x4.

## 2018-07-05 NOTE — ED Provider Notes (Signed)
[facility] EMERGENCY DEPARTMENT Provider Note   CSN: [id number] Arrival date & time: 07/05/18  1939    History   Chief Complaint No chief complaint on file.   HPI Holly Mueller is a 53 y.o. female.     53 yo F with a cc of cough congestion and shortness of breath.  This been going on for about a week now.  Patient was seen at the onset of the symptoms and started on antibiotics Tessalon Perles without improvement.  The patient also was recently treated with a burst dose of steroids which made her blood sugars go crazy.  She has some mild chest pain with coughing.  Denies vomiting or diarrhea subjective but not measured fevers.  The history is provided by the patient.  Illness  Severity:  Mild Onset quality:  Sudden Duration:  1 week Timing:  Constant Progression:  Worsening Chronicity:  New Associated symptoms: congestion, cough and shortness of breath   Associated symptoms: no chest pain, no fever, no headaches, no myalgias, no nausea, no rhinorrhea, no vomiting and no wheezing     Past Medical History:  Diagnosis Date  . Arthritis    bursitis left hip flares-not an issue  . Diabetes mellitus without complication ([place])   . Edentulous    10-19-13 at present  . Epilepsy ([place]) in 1972   No seizures since 1972. Previously treated with phenobarbital.   . Fever 06/2017  . GERD (gastroesophageal reflux disease) 2008  . Headache(784.0)    hx of migraines   . Hypothyroidism 2008  . Lower esophageal ring 08/18/2013  . Seasonal allergies 2003  . Shortness of breath     Patient Active Problem List   Diagnosis Date Noted  . Insurance coverage problems 04/04/2018  . ILD (interstitial lung disease) ([place]) 03/23/2018  . Food insecurity 10/15/2017  . Hyperglycemia 10/06/2017  . Ectatic aorta ([place]) 09/11/2017  . Left leg pain   . Type 2 diabetes mellitus without complication, without long-term current use of insulin ([place])   . Cirrhosis of liver without  ascites ([place]) 11/17/2016  . Thrombocytopenia ([place]) 11/17/2016  . Leucopenia 11/17/2016  . Bicytopenia 10/24/2016  . Epistaxis 10/23/2016  . GERD (gastroesophageal reflux disease) 10/23/2016  . Cough 12/09/2015  . Chronic pain of left thumb 10/09/2015  . Precordial chest pain 08/03/2015  . Bursitis of left hip 09/14/2013  . Lower esophageal ring 08/18/2013  . DOE (dyspnea on exertion) 08/17/2013  . Ganglion cyst of wrist 08/24/2012  . Right hip pain 08/23/2012  . Umbilical hernia 32/06/3341  . Healthcare maintenance 02/14/2012  . Osteoarthritis 02/12/2012  . Allergic rhinitis 08/26/2011  . Obesity 07/08/2010  . SEIZURES, HX OF 12/12/2009  . DENTAL CARIES 12/18/2008  . DEPRESSION 07/26/2008  . MIGRAINE HEADACHE 11/15/2007  . Hypothyroidism 10/12/2006  . Restrictive lung disease 07/30/2006    Past Surgical History:  Procedure Laterality Date  . BALLOON DILATION N/A 10/24/2013   Procedure: BALLOON DILATION;  Surgeon: Gatha Mayer, MD;  Location: [facility] ENDOSCOPY;  Service: Endoscopy;  Laterality: N/A;  . CESAREAN SECTION     x2  . DENTAL SURGERY     multiple extractions 3'15  . ESOPHAGOGASTRODUODENOSCOPY N/A 10/24/2013   Procedure: ESOPHAGOGASTRODUODENOSCOPY (EGD);  Surgeon: Gatha Mayer, MD;  Location: [facility];  Service: Endoscopy;  Laterality: N/A;  . LIVER BIOPSY  06/30/2012   Procedure: LIVER BIOPSY;  Surgeon: Shann Medal, MD;  Location: [facility] ORS;  Service: General;;  . NOVASURE ABLATION    .  SHOULDER ARTHROSCOPY Right 2011  . UMBILICAL HERNIA REPAIR N/A 06/30/2012   Procedure: remove umbilicus;  Surgeon: Shann Medal, MD;  Location: [facility] ORS;  Service: General;  Laterality: N/A;  . VENTRAL HERNIA REPAIR N/A 06/30/2012   Procedure: LAPAROSCOPIC VENTRAL HERNIA;  Surgeon: Shann Medal, MD;  Location: [facility] ORS;  Service: General;  Laterality: N/A;  With Mesh  . WISDOM TOOTH EXTRACTION       OB History   No obstetric history on file.      Home Medications    Prior  to Admission medications   Medication Sig Start Date End Date Taking? Authorizing Provider  albuterol (PROAIR HFA) 108 (90 Base) MCG/ACT inhaler INHALE 2 PUFFS INTO THE LUNGS EVERY 6 HOURS AS NEEDED FOR SHORTNESS OF BREATH 04/25/18  Yes Rumball, Alison, DO  albuterol (PROVENTIL) (2.5 MG/3ML) 0.083% nebulizer solution Take 3 mLs (2.5 mg total) by nebulization every 6 (six) hours as needed for wheezing or shortness of breath. 04/25/18  Yes Rory Percy, DO  amoxicillin-clavulanate (AUGMENTIN) 875-125 MG tablet Take 1 tablet by mouth every 12 (twelve) hours. 06/27/18  Yes Hernandez, Ana P, PA-C  cetirizine (ZYRTEC) 10 MG tablet Take 1 tablet (10 mg total) by mouth daily. 01/27/17  Yes [name], MD  fluticasone [facility]) 50 MCG/ACT nasal spray Place 2 sprays into both nostrils daily.   Yes [provider]  furosemide (LASIX) 40 MG tablet Take 1 tablet (40 mg total) by mouth daily. 03/07/18  Yes Hensel, Jamal Collin, MD  ibuprofen (ADVIL,MOTRIN) 200 MG tablet Take 600 mg by mouth every 6 (six) hours as needed for moderate pain.   Yes [provider]  levothyroxine (SYNTHROID, LEVOTHROID) 125 MCG tablet Take 2 tablets (250 mcg total) by mouth daily. 01/05/18  Yes Rory Percy, DO  metFORMIN (GLUCOPHAGE) 500 MG tablet Take 1 tablet (500 mg total) by mouth 2 (two) times daily with a meal. 03/04/18  Yes Hensel, Jamal Collin, MD  Mometasone Furoate [facility] HFA) 200 MCG/ACT AERO Inhale 2 puffs into the lungs 2 (two) times daily. 05/25/18  Yes Brand Males, MD  pantoprazole (PROTONIX) 40 MG tablet Take 1 tablet (40 mg total) by mouth 2 (two) times daily before a meal. 03/22/18  Yes Elsie Stain, MD  ACCU-CHEK SOFTCLIX LANCETS lancets USE UP TO 4 TIMES DAILY AS DIRECTED 04/25/18   Rory Percy, DO  blood glucose meter kit and supplies KIT Dispense based on patient and insurance preference. Use up to four times daily as directed. (FOR ICD-9 250.00, 250.01). 06/19/17   Rory Percy, DO  glucose blood (ACCU-CHEK AVIVA PLUS) test strip USE UP TO 4 TIMES DAILY AS DIRECTED 04/25/18   Rory Percy, DO  ursodiol (ACTIGALL) 500 MG tablet Take 1 tablet (500 mg total) by mouth 2 (two) times daily. Patient not taking: Reported on 05/25/2018 03/03/18   Zenia Resides, MD    Family History Family History  Problem Relation Age of Onset  . Hypertension Mother   . Diabetes Mother   . Lung cancer Father   . Stroke Father   . Diabetes Sister   . Hypertension Sister     Social History Social History   Tobacco Use  . Smoking status: Former Smoker    Packs/day: 0.25    Types: Cigarettes    Last attempt to quit: 1997    Years since quitting: 23.1  . Smokeless tobacco: Never Used  . Tobacco comment: social  Substance Use Topics  . Alcohol use: Yes  Comment: occ  . Drug use: No     Allergies   Aspirin   Review of Systems Review of Systems  Constitutional: Negative for chills and fever.  HENT: Positive for congestion. Negative for rhinorrhea.   Eyes: Negative for redness and visual disturbance.  Respiratory: Positive for cough and shortness of breath. Negative for wheezing.   Cardiovascular: Negative for chest pain and palpitations.  Gastrointestinal: Negative for nausea and vomiting.  Genitourinary: Negative for dysuria and urgency.  Musculoskeletal: Negative for arthralgias and myalgias.  Skin: Negative for pallor and wound.  Neurological: Negative for dizziness and headaches.     Physical Exam Updated Vital Signs BP 122/69 (BP Location: Right Arm)   Pulse 86   Temp 98.3 F (36.8 C) (Oral)   Resp 17   SpO2 96%   Physical Exam Vitals signs and nursing note reviewed.  Constitutional:      General: She is not in acute distress.    Appearance: She is well-developed. She is not diaphoretic.  HENT:     Head: Normocephalic and atraumatic.     Comments: Swollen turbinates, posterior nasal drip, no noted sinus ttp, tm normal bilaterally.    Eyes:     Pupils: Pupils are equal, round, and reactive to light.  Neck:     Musculoskeletal: Normal range of motion and neck supple.  Cardiovascular:     Rate and Rhythm: Normal rate and regular rhythm.     Heart sounds: No murmur. No friction rub. No gallop.   Pulmonary:     Effort: Pulmonary effort is normal.     Breath sounds: No wheezing or rales.  Abdominal:     General: There is no distension.     Palpations: Abdomen is soft.     Tenderness: There is no abdominal tenderness.  Musculoskeletal:        General: No tenderness.  Skin:    General: Skin is warm and dry.  Neurological:     Mental Status: She is alert and oriented to person, place, and time.  Psychiatric:        Behavior: Behavior normal.      ED Treatments / Results  Labs (all labs ordered are listed, but only abnormal results are displayed) Labs Reviewed - No data to display  EKG EKG Interpretation  Date/Time:  Tuesday July 05 2018 19:42:36 EST Ventricular Rate:  80 PR Interval:  162 QRS Duration: 88 QT Interval:  386 QTC Calculation: 445 R Axis:   4 Text Interpretation:  Normal sinus rhythm Normal ECG No significant change since last tracing Confirmed by Deno Etienne [phone]) on 07/05/2018 8:53:49 PM   Radiology Dg Chest 2 View  Result Date: 07/05/2018 CLINICAL DATA:  Shortness of breath EXAM: CHEST - 2 VIEW COMPARISON:  06/27/2018 FINDINGS: Low lung volumes. Interstitial prominence throughout the lungs, most pronounced in the lung bases, stable since prior study, likely chronic interstitial lung disease. Heart is normal size. No effusions or acute bony abnormality. IMPRESSION: Low lung volumes.  Chronic interstitial lung disease. Electronically Signed   By: Rolm Baptise M.D.   On: 07/05/2018 21:51    Procedures Procedures (including critical care time)  Medications Ordered in ED Medications  albuterol (PROVENTIL) (2.5 MG/3ML) 0.083% nebulizer solution 5 mg (5 mg Nebulization Given 07/05/18  1945)  ketorolac (TORADOL) injection 15 mg (15 mg Intramuscular Given 07/05/18 2208)  chlorpheniramine-HYDROcodone (TUSSIONEX) 10-8 MG/5ML suspension 5 mL (5 mLs Oral Given 07/05/18 2208)  dexamethasone (DECADRON) tablet 10 mg (10 mg Oral Given  07/05/18 2209)  ipratropium-albuterol (DUONEB) 0.5-2.5 (3) MG/3ML nebulizer solution 3 mL (3 mLs Nebulization Given 07/05/18 2209)     Initial Impression / Assessment and Plan / ED Course  I have reviewed the triage vital signs and the nursing notes.  Pertinent labs & imaging results that were available during my care of the patient were reviewed by me and considered in my medical decision making (see chart for details).        53 yo F with a cc of uri like symptoms.  Going on for the past week.  Patient feels her cough has worsening.  CXR viewed by me and negative.  The patient has clear lung sounds but possible bronchospasm.  Was given a breathing treatment earlier with very mild improvement.  Will give another breathing treatment and Decadron.  Discharge home.  10:50 PM:  I have discussed the diagnosis/risks/treatment options with the patient and family and believe the pt to be eligible for discharge home to follow-up with PCP. We also discussed returning to the ED immediately if new or worsening sx occur. We discussed the sx which are most concerning (e.g., sudden worsening pain, fever, inability to tolerate by mouth) that necessitate immediate return. Medications administered to the patient during their visit and any new prescriptions provided to the patient are listed below.  Medications given during this visit Medications  albuterol (PROVENTIL) (2.5 MG/3ML) 0.083% nebulizer solution 5 mg (5 mg Nebulization Given 07/05/18 1945)  ketorolac (TORADOL) injection 15 mg (15 mg Intramuscular Given 07/05/18 2208)  chlorpheniramine-HYDROcodone (TUSSIONEX) 10-8 MG/5ML suspension 5 mL (5 mLs Oral Given 07/05/18 2208)  dexamethasone (DECADRON) tablet 10 mg (10 mg  Oral Given 07/05/18 2209)  ipratropium-albuterol (DUONEB) 0.5-2.5 (3) MG/3ML nebulizer solution 3 mL (3 mLs Nebulization Given 07/05/18 2209)     The patient appears reasonably screen and/or stabilized for discharge and I doubt any other medical condition or other [facility] requiring further screening, evaluation, or treatment in the ED at this time prior to discharge.    Final Clinical Impressions(s) / ED Diagnoses   Final diagnoses:  Viral URI with cough    ED Discharge Orders    None       Deno Etienne, DO 07/05/18 2251

## 2018-07-05 NOTE — Discharge Instructions (Signed)
Take tylenol 2 pills 4 times a day and motrin 4 pills 3 times a day.  Drink plenty of fluids.  Return for worsening shortness of breath, headache, confusion. Follow up with your family doctor.

## 2018-07-06 ENCOUNTER — Other Ambulatory Visit: Payer: Self-pay

## 2018-07-06 ENCOUNTER — Telehealth: Payer: Self-pay | Admitting: Internal Medicine

## 2018-07-06 NOTE — Telephone Encounter (Signed)
Patient is returning phone call.  Patient phone number is [phone].

## 2018-07-06 NOTE — Telephone Encounter (Signed)
lmtcb x1 pt.  Brand Males, MD       3:40 PM  Note    Discussed at MDD conferecne 06/21/2018 - radiology is probable UIP and thre is progression snice 2018  Plan Keep 07/12/2018 appt with me in ILD clinic - will likely recommend bronch/bal +/- mini biopsy (but might avoid bx due to chronic low platelets)  And also discuss Rx/mgmt plan - likely antifobrotic  Thanks  Mr

## 2018-07-06 NOTE — Telephone Encounter (Signed)
Spoke with pt and relayed info from Dr Chase Caller.  Pt has appt on 07/12/2018 at 2pm.  Pt questioned about ONO.  Contacted [facility] and they tried to reach her without success.  Gave them the new phone number.  Informed pt of this.  Nothing further needed.

## 2018-07-06 NOTE — Telephone Encounter (Signed)
Patient is returning phone call.  Patient phone number is [phone]. °

## 2018-07-07 ENCOUNTER — Ambulatory Visit (INDEPENDENT_AMBULATORY_CARE_PROVIDER_SITE_OTHER): Payer: Self-pay | Admitting: Family Medicine

## 2018-07-07 VITALS — BP 115/80 | HR 76 | Temp 98.3°F | Wt 240.2 lb

## 2018-07-07 DIAGNOSIS — R05 Cough: Secondary | ICD-10-CM

## 2018-07-07 DIAGNOSIS — R059 Cough, unspecified: Secondary | ICD-10-CM

## 2018-07-07 DIAGNOSIS — R053 Chronic cough: Secondary | ICD-10-CM

## 2018-07-07 MED ORDER — PREDNISOLONE 15 MG/5ML PO SOLN: 50.0000 mg | Freq: Every day | ORAL | 0 refills | Status: DC

## 2018-07-07 NOTE — Progress Notes (Signed)
   CC: cough   HPI  Cough - seen in the ED 2/25, sent home with decadron and had 2 duonebs while there. CXR with chronic low volumes. Also had tussionex. Follows with Dr. Ann Lions for likely ILD, has appt on 3/3. Flu negative on 2/17. On 2/17 she was prescribed amox for possible sinusitis, which feels improved now. Today she is having trouble sleeping due to SOB and cough. She felt good on the steroids and then she still has a cough today that is bothersome. She wakes up unable to catch her breath, worsened with coughing. She is able to speak in long sentences today. She has been using the inhaler BID that Dr. MR gave her. She does have proair at home but she is out of the albuterol neb solution. She is out of the strips and lancets and neb solution due to high cost. She feels SOB regardless of her situation, sitting or lying or walking around. No change with exertion.  She is using deep breathing to try to help her cough today.   ROS: Denies CP, SOB, abdominal pain, dysuria, changes in BMs.   CC, SH/smoking status, and VS noted  Objective: BP 115/80   Pulse 76   Temp 98.3 F (36.8 C) (Oral)   Wt 240 lb 3.2 oz (109 kg)   SpO2 95%   BMI 39.97 kg/m  Gen: NAD, alert, cooperative, and pleasant. HEENT: NCAT, EOMI, PERRL CV: RRR, no murmur Resp: Decreased time of inspiration and expiration with fine basilar crackles.  No increased work of breathing, no diminished breath sounds. Ext: No edema, warm Neuro: Alert and oriented, Speech clear, No gross deficits  Assessment and plan:  Cough Continued chronic cough, reviewed with the patient to make sure she knew about her appointment next week with Dr. Chase Caller at 2 PM on 3/30.  She knew about this.  I reviewed with her that they will likely have continued discussion around the fact that she has some sort of interstitial lung disease.  She did not recall discussing this before.  I explained that this is a nonspecific diagnosis that usually  requires a lung doctor to help manage symptoms.  I explained that her cough is likely related to this.  I do not think she has any additional super imposed infections.  Did consider fluid overload, however chest x-ray was stable 2 days ago in the emergency room and she does not have worsening dyspnea on exertion or signs of volume overload.  She is already using her steroid inhaler and her albuterol as directed, we will continue a 6-day course of steroids to try to get her to her pulmonology appointment next week.  I prescribed prednisolone as the patient is hesitant to take prednisone due to history of elevation of CBGs.  She does have limited understanding of her health care.   No orders of the defined types were placed in this encounter.   Meds ordered this encounter  Medications  . prednisoLONE (PRELONE) 15 MG/5ML SOLN    Sig: Take 16.7 mLs (50 mg total) by mouth daily before breakfast for 6 days.    Dispense:  100.2 mL    Refill:  0    Ralene Ok, MD, PGY3 07/07/2018 11:00 AM

## 2018-07-07 NOTE — Assessment & Plan Note (Signed)
Continued chronic cough, reviewed with the patient to make sure she knew about her appointment next week with Dr. Chase Caller at 2 PM on 3/30.  She knew about this.  I reviewed with her that they will likely have continued discussion around the fact that she has some sort of interstitial lung disease.  She did not recall discussing this before.  I explained that this is a nonspecific diagnosis that usually requires a lung doctor to help manage symptoms.  I explained that her cough is likely related to this.  I do not think she has any additional super imposed infections.  Did consider fluid overload, however chest x-ray was stable 2 days ago in the emergency room and she does not have worsening dyspnea on exertion or signs of volume overload.  She is already using her steroid inhaler and her albuterol as directed, we will continue a 6-day course of steroids to try to get her to her pulmonology appointment next week.  I prescribed prednisolone as the patient is hesitant to take prednisone due to history of elevation of CBGs.  She does have limited understanding of her health care.

## 2018-07-07 NOTE — Patient Instructions (Signed)
It was a pleasure to see you today! Thank you for choosing [facility] for your primary care. Holly Mueller was seen for chronic cough.   Our plans for today were:  You need to go to your appointment with Dr. MR on 3/3 at 2pm. He is going to talk to you about your lung problem, which is likely something called interstitial lung disease. Your cough is related to this. We will try the steroid to help some with the cough until your appointment next week.  Go to the ED if things get worse.   Best,  Dr. Lindell Noe

## 2018-07-08 ENCOUNTER — Telehealth: Payer: Self-pay | Admitting: Family Medicine

## 2018-07-08 NOTE — Telephone Encounter (Signed)
**  After Hours/ Emergency Line Call** Patient called complaining of elevated of elevated BG while on prednisolone started on 2/27 in the office for cough in the setting of ILD. Patient also reported some dizziness. Discuss hydration and discontinuing steroid and reassessing symptoms if they did not improve she needed to go to the ED for further evaluation. She will make an appointment on Monday if symptoms do not improve. She might need a note for work if she misses any time over the weekend.   Marjie Skiff, MD [place], PGY-3

## 2018-07-12 ENCOUNTER — Encounter: Payer: Self-pay | Admitting: *Deleted

## 2018-07-12 ENCOUNTER — Ambulatory Visit (INDEPENDENT_AMBULATORY_CARE_PROVIDER_SITE_OTHER): Payer: Medicaid Other | Admitting: Internal Medicine

## 2018-07-12 ENCOUNTER — Ambulatory Visit (INDEPENDENT_AMBULATORY_CARE_PROVIDER_SITE_OTHER): Payer: Self-pay | Admitting: Internal Medicine

## 2018-07-12 VITALS — BP 118/70 | HR 72 | Ht 66.0 in | Wt 244.0 lb

## 2018-07-12 DIAGNOSIS — K7460 Unspecified cirrhosis of liver: Secondary | ICD-10-CM

## 2018-07-12 DIAGNOSIS — R768 Other specified abnormal immunological findings in serum: Secondary | ICD-10-CM

## 2018-07-12 DIAGNOSIS — J849 Interstitial pulmonary disease, unspecified: Secondary | ICD-10-CM

## 2018-07-12 LAB — PULMONARY FUNCTION TEST
DL/VA % PRED: 128 %
DL/VA: 5.43 ml/min/mmHg/L
DLCO cor % pred: 69 %
DLCO cor: 15.56 ml/min/mmHg
DLCO unc % pred: 66 %
DLCO unc: 14.68 ml/min/mmHg
FEF 25-75 PRE: 1.93 L/s
FEF2575-%Pred-Pre: 70 %
FEV1-%PRED-PRE: 53 %
FEV1-Pre: 1.58 L
FEV1FVC-%Pred-Pre: 105 %
FEV6-%Pred-Pre: 51 %
FEV6-PRE: 1.87 L
FEV6FVC-%PRED-PRE: 102 %
FVC-%Pred-Pre: 50 %
FVC-Pre: 1.87 L
Pre FEV1/FVC ratio: 84 %
Pre FEV6/FVC Ratio: 100 %

## 2018-07-12 NOTE — Addendum Note (Signed)
Addended by: Laquincy Eastridge E on: 07/12/2018 04:05 PM   Modules accepted: Orders  

## 2018-07-12 NOTE — Progress Notes (Signed)
B&A Spirometry and DLCO performed today.

## 2018-07-12 NOTE — Patient Instructions (Addendum)
Cirrhosis of liver without ascites, unspecified hepatic cirrhosis type ([place])  - check cbc with difff, lft, inr - refer to [facility] - last seen 2015 by Dr Silvano Rusk    ILD (interstitial lung disease) ([place]) Positive ANA (antinuclear antibody)  - possibilities include IPF, IPAF v NSIP - progressive on CT summer 2018 -> fall 2019 but stable on breathing test during this time - recommend bronchoscopy with lavage (no biopsy due to borderline platelet level) for cell count  - do 07/20/2018 at [facility] - late morning or 07/21/2018 thu at [place] early afternoon   - need to be npo for 6h prior but can/should take medications pre-procedure - test ono room air at home  Followup 4 weeks In ILD clinic   - subjective symptom scale and walk test at followup

## 2018-07-12 NOTE — Progress Notes (Signed)
HPI Oc 2017  : Holly Mueller is a 53 year old with past history of asthma. She has poor control over the past year and needed to go on several tapers of prednisone. She was seen in the ED in July 2017 for an exacerbation. She is currently maintained on Symbicort and albuterol. She needs to use her albuterol every day. She denies any nighttime awakenings. She also has significant issues with sinusitis, postnasal drip with constant clearing of the throat. She has heartburn and is on Prilosec.   Interim History Aug 2018: Seen in ED in July 2018 for dyspnea, cough, sinus and throat congestion. CTA did not show PE, there was mild atelectasis, GGO at base. PFTs do not show obstruction. There is restriction but no ILD on CT Today in the office she has chronic cough, no dyspnea or wheezing  She is in being evaluated for cytopenia. She's had a bone marrow biopsy which did not show any abnormality. She has a right upper quadrant ultrasound and a GI Eval Pending for Evaluation of Cirrhosis.   Interim History feb 2019: Admitted from 2/4-06/19/17 with fevers, diagnosed with flu.  Treated with Tamiflu.  She was seen by GI at that time for evaluation of cirrhosis and recommended outpatient follow-up with Dr. Carlean Purl. Hospitalized again from 2/19-2/24/19 with cellulitis, lower extremity pain.  Workup was negative for PE and DVT.  She was treated with Keflex.  CT showed stable mild groundglass opacities consistent with volume overload.    Reports that breathing is stable.  Still continues on Symbicort and albuterol.  Chief complaint today is left lower extremity pain.  There is no swelling, erythema, fevers, chills.     Interim History May 2019: Hospitalized again and early May for chest pain, dyspnea.  She had a stress test which is normal. Also seen by pulmonary for CTA that was negative for PE but showed some chronic lung opacities. Evaluated for aspiration.  Swallow eval was normal Serologies  shows positive ANA, SSA    Nov 2019: Holly Mueller is a 53 year old with past history of asthma, cirrhosis, cytopenia, restrictive lung disease. Seen in the ED in July 2017 for an exacerbation. She is currently maintained on Symbicort and albuterol. She needs to use her albuterol every day. She denies any nighttime awakenings. She also has significant issues with sinusitis, postnasal drip with constant clearing of the throat. She has heartburn and is on Prilosec.  Admitted from 2/4-06/19/17 with fevers, diagnosed with flu.  Treated with Tamiflu.  She was seen by GI at that time for evaluation of cirrhosis and recommended outpatient follow-up with Dr. Carlean Purl. Hospitalized 2/19-2/24/19 with cellulitis, lower extremity pain.  Workup was negative for PE and DVT.  She was treated with Keflex.  CT showed stable mild groundglass opacities consistent with volume overload.    Hospitalized again in May 2019 for chest pain, dyspnea.  She had a stress test which is normal. Also seen by pulmonary for CTA that was negative for PE but showed some chronic lung opacities. Evaluated for aspiration.  Swallow eval was normal Serologies shows positive ANA, SSA  She has seen Dr. Lebron Conners, hematology for cytopenia status post bone marrow biopsy.  Her low blood counts are felt to be secondary to cirrhosis.  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 Exposures: Had exposed to mold in the past.  No dampness, heart type, Jacuzzi Smoking history: Denies smoking.  She is exposed to secondhand smoke Travel  history: No significant travel history °Relevant family history: No significant family history of lung disease. ° ° °Interim History: Nov 2019 °Chronic cough continues with mild dyspnea on exertion. °Referred to Dr. Zaivion Kundrat by Dr, Wright, primary care for second opinion regarding interstitial lung disease. ° ° °OV 05/25/2018 ° °Subjective:  °Patient ID: Holly Mueller, female , DOB: 01/27/1966 ,  age 53 y.o. , MRN: [id number] , ADDRESS: [id number] °[place] [address] ° ° °05/25/2018 -   °Chief Complaint  °Patient presents with  °• Follow-up  °  Pt states things have been worse for her since last visit. Pt has complaints of SOB which happens almost at any time, has an occ cough with white foamy mucus, and also has some complaints of CP.  ° ° ° °HPI °Holly Mueller 52 y.o. -referred by Dr. Patrick Wright and Dr. Praveen Mannam for interstitial lung disease.  Is a very complex history  Much of the work-up is already been done.  The time spent was in reviewing the chart and talking to the patient.  It appears she has idiopathic cirrhosis [reviewed and confirmed to 2014 or 2015 liver biopsy showing cirrhosis with active inflammation].  She does not follow-up with a GI doctor.  Her platelets run around 50-60000.  She sees Dr. Kozlow for allergy.  She used to be on asthma inhaler but following repeated normal nitric oxide her Symbicort was stopped maybe approximately over a year ago.  She is not sure if the cough is worse after that.   ° °Her current problem seems to be dyspnea on exertion for the last few to several years progressively getting worse.  She also has constant chronic cough.  She is not on oxygen.  She does not recollect having her overnight oxygen desaturation tested.  It appears a few years ago her CT scans were fairly clear and suggestive of volume overload but with time the CT scans of change.  Most recent CT scan of the chest in late 2019 is reported as probable UIP based on craniocaudal gradient and some traction bronchiectasis without honeycombing.  The findings are early and mild.  I personally visualized the CT and agree with the findings.  Rest of the ILD question is all worked up she is got trace positive autoimmune antibodies.  Rheumatology referral is pending and an appointment is pending according to the patient.  The only PFTs from July 2018 which suggest  restriction. ° ° ° ° ° °IMPRESSION: °1. The appearance of the lungs is compatible with interstitial lung °disease, with a spectrum of findings categorized as probable usual °interstitial pneumonia (UIP) based on current ATS guidelines. Repeat °high-resolution chest CT is suggested in 12 months to assess for °temporal changes in the appearance of the lung parenchyma. °2. Aortic atherosclerosis. °3. Cirrhosis with stigmata of portal venous hypertension, including °splenomegaly. °  °Aortic Atherosclerosis (ICD10-I70.0). °  °  °Electronically Signed °  By: Daniel  Entrikin M.Mueller. °  On: 04/05/2018 18:24 ° ° ° Results for Limones, Holly Mueller (MRN [id number]) as of 05/25/2018 10:40 ° Ref. Range 10/06/2017 16:42  °IgE (Immunoglobulin E), Serum Latest Ref Range: <OR=114 kU/L <2  °ASPERGILLUS FUMIGATUS Latest Ref Range: NEGATIVE  NEGATIVE  °[place]on Serum Latest Ref Range: NEGATIVE  NEGATIVE  °Anti Nuclear Antibody(ANA) Latest Ref Range: NEGATIVE  POSITIVE (A)  °ANA Pattern 1 Unknown SPECKLED (A)  °ANA Titer 1 Latest Units: titer 1:80 (H)  °Cyclic Citrullin Peptide Ab Latest Units: UNITS <16  °ds DNA Ab Latest Units: IU/mL   2  RA Latex Turbid. Latest Ref Range: <14 IU/mL <14  ENA SM Ab Ser-aCnc Latest Ref Range: <1.0 NEG AI <1.0 NEG  SSA (Ro) (ENA) Antibody, IgG Latest Ref Range: <1.0 NEG AI >8.0 POS (A)  SSB (La) (ENA) Antibody, IgG Latest Ref Range: <1.0 NEG AI <1.0 NEG  Scleroderma (Scl-70) (ENA) Antibody, IgG Latest Ref Range: <1.0 NEG AI <1.0 NEG  SM/RNP Latest Ref Range: <1.0 NEG AI <1.0 NEG    Cirrhosis labs - Results for Holly Mueller, Holly Mueller (MRN [id number]) as of 05/25/2018 12:00  Ref. Range 04/26/2018 23:47  Platelets Latest Ref Range: 150 - 400 K/uL 51 (L)    OV 07/12/2018  Subjective:  Patient ID: Holly Mueller, female , DOB: 14-Apr-1966 , age 43 y.o. , MRN: [id number] , ADDRESS: [place] [id number]   07/12/2018 -   Chief Complaint  Patient presents with  . Follow-up    PFT done today. She states  her breathing has been okay. She states she has good days and bad days but she makes sure to pace herself. Recenly seen in ED for URI.    Follow-up probable UIP on CT scan of the chest November 2019 with worsening compared to July 2018.  Female gender.  Positive ANA and SSA associated with clubbing and bilateral basal Velcro crackles -definition of ILD in progress.  Associated with cirrhosis and low platelets, bili 2.2 idiopathic variety  HPI Holly Mueller 53 y.o. -returns for follow-up of her ILD.  In the interim we discussed her case at the multidisciplinary case conference.  The consensus was that she did indeed have probable UIP on the CT scan [greater than 80% probability that this is indeed UIP].  Radiologist was concerned about progression between summer 2018 and the fall 2019 based on CT scan appearance.  In the interim overall she is stable although since seeing me she is having respiratory exacerbation has been treated with antibiotic and prednisone.  The prednisone is still continuing with tomorrow being the last day.  I reviewed the primary care physician chart and confirmed it.  She is beginning to feel close to baseline.  She had pulmonary function test today that shows very similar values compared to summer 2018.  Therefore in essence she is stable on PFTs compared to summer 2018 but the decline is on the CT scan.  Her most recent liver work shows a rising bilirubin.  She says she has not seen a hepatologist or GI specialist in a few years.  She is willing to undergo bronchoscopy to narrow down the specific variety of ILD.  We discussed her tolerance to sedation.  She does not have any sleep apnea.  Her last procedure was 3 years ago and 5 years ago .  During this time she has held well with sedation procedures.      Simple office walk 250 feet x  3 laps goal with forehead probe 05/25/2018   O2 used Room air  Number laps completed 3  Comments about pace slow  Resting Pulse Ox/HR 100%  and 70/min  Final Pulse Ox/HR 94% and 92/min  Desaturated </= 88% no  Desaturated <= 3% points Yes, 6 points  Got Tachycardic >/= 90/min yes  Symptoms at end of test Mild dyspnea final lap  Miscellaneous comments x    Results for Holly Mueller, Holly Mueller (MRN [id number]) as of 07/12/2018 14:55  Ref. Range 11/13/2016 11:21 07/12/2018 13:58  FVC-Pre Latest Units: L 1.80 1.87  FVC-%Pred-Pre Latest  Units: % 48 50  Results for Holly Mueller, Holly Mueller (MRN [id number]) as of 07/12/2018 14:55  Ref. Range 07/12/2018 13:58  DLCO cor Latest Units: ml/min/mmHg 15.56  DLCO cor % pred pre Latest Units: % 69   Results for Holly Mueller, Holly Mueller (MRN [id number]) as of 07/12/2018 14:55  Ref. Range 11/30/2006 14:26 04/27/2007 17:19 08/10/2007 14:05 12/18/2008 00:19 12/18/2008 00:42 10/21/2009 09:00 10/21/2009 09:16 12/12/2009 00:00 02/03/2010 08:28 04/24/2011 09:50 08/19/2011 14:24 01/25/2012 10:20 05/26/2012 11:19 06/27/2012 10:35 07/31/2012 13:06 10/31/2012 10:30 10/31/2012 12:20 01/18/2013 15:07 04/25/2013 15:41 07/13/2013 12:20 11/22/2013 13:34 02/08/2014 12:11 03/06/2016 11:48 10/23/2016 11:52 11/17/2016 12:04 11/23/2016 11:11 12/02/2016 08:58 04/05/2017 14:55 06/14/2017 16:29 06/16/2017 05:37 06/17/2017 04:53 06/18/2017 05:37 06/19/2017 04:03 06/29/2017 18:42 06/30/2017 08:19 07/01/2017 03:37 07/02/2017 08:02 07/03/2017 03:03 07/04/2017 02:44 07/20/2017 08:50 08/04/2017 20:50 09/10/2017 15:30 09/10/2017 23:19 09/11/2017 06:30 09/12/2017 04:38 09/13/2017 08:18 10/05/2017 14:22 12/28/2017 19:49 12/29/2017 05:01 12/30/2017 06:10 12/31/2017 05:19 01/01/2018 04:42 01/02/2018 04:12 01/03/2018 05:10 01/03/2018 11:25 01/04/2018 06:41 01/05/2018 04:36 01/12/2018 10:52 03/03/2018 11:08 03/12/2018 10:21 04/26/2018 23:47 06/27/2018 16:17  Platelets Latest Ref Range: 150 - 400 K/uL 262 219 234 208  175  227  157  PLATELET CLUMPS NOTED ON SMEAR, COUNT APPEARS DECREASED 130 (L) 127 (L) PLATELET CLUMPS NOTED ON SMEAR, UNABLE TO ESTIMATE 126 (L) PLATELET CLUMPS NOTED ON SMEAR, COUNT APPEARS DECREASED 143 (L) 164 115 (L)  105 (L)  78.0 (L) 73 (LL) 86 (L) 79 (L) 66 (L) 71 (L) 48 (L) 49 (L) 52 (L) 51 (L) 49 (L) 90 (L) 86 (L) 87 (L) 98 (L) 109 (L) 104 (L) 79 (L) PLATELET CLUMPS NOTED ON SMEAR, COUNT APPEARS DECREASED 65 (LL) 60 (L) 59 (L) 67 (L) 68 (L) 71 (LL) 43 (L) 50 (L) 49 (L) 59 (L) 62 (L) 79 (L) 65 (L) 56 (L) 56 (L) 55 (L) 81 (LL) 59 (LL) 62 (L) 51 (L) 66 (L)    ROS - per HPI     has a past medical history of Arthritis, Diabetes mellitus without complication ([place]), Edentulous, Epilepsy ([place]) (in 1972), Fever (06/2017), GERD (gastroesophageal reflux disease) (2008), Headache(784.0), Hypothyroidism (2008), Lower esophageal ring (08/18/2013), Seasonal allergies (2003), and Shortness of breath.   reports that she quit smoking about 23 years ago. Her smoking use included cigarettes. She smoked 0.25 packs per day. She has never used smokeless tobacco.  Past Surgical History:  Procedure Laterality Date  . BALLOON DILATION N/A 10/24/2013   Procedure: BALLOON DILATION;  Surgeon: Gatha Mayer, MD;  Location: [facility];  Service: Endoscopy;  Laterality: N/A;  . CESAREAN SECTION     x2  . DENTAL SURGERY     multiple extractions 3'15  . ESOPHAGOGASTRODUODENOSCOPY N/A 10/24/2013   Procedure: ESOPHAGOGASTRODUODENOSCOPY (EGD);  Surgeon: Gatha Mayer, MD;  Location: [facility];  Service: Endoscopy;  Laterality: N/A;  . LIVER BIOPSY  06/30/2012   Procedure: LIVER BIOPSY;  Surgeon: Shann Medal, MD;  Location: [facility];  Service: General;;  . NOVASURE ABLATION    . SHOULDER ARTHROSCOPY Right 2011  . UMBILICAL HERNIA REPAIR N/A 06/30/2012   Procedure: remove umbilicus;  Surgeon: Shann Medal, MD;  Location: [facility];  Service: General;  Laterality: N/A;  . VENTRAL HERNIA REPAIR N/A 06/30/2012   Procedure: LAPAROSCOPIC VENTRAL HERNIA;  Surgeon: Shann Medal, MD;  Location: [facility];  Service: General;  Laterality: N/A;  With Mesh  . WISDOM TOOTH EXTRACTION      Allergies  Allergen Reactions  . Aspirin Nausea Only    Upset  stomach    Immunization History  Administered Date(s) Administered  . Pneumococcal Polysaccharide-23 08/26/2011, 09/12/2017  . Td 05/11/2006    Family History  Problem Relation Age of Onset  . Hypertension Mother   . Diabetes Mother   . Lung cancer Father   . Stroke Father   . Diabetes Sister   . Hypertension Sister      Current Outpatient Medications:  .  ACCU-CHEK SOFTCLIX LANCETS lancets, USE UP TO 4 TIMES DAILY AS DIRECTED, Disp: 100 each, Rfl: 0 .  albuterol (PROAIR HFA) 108 (90 Base) MCG/ACT inhaler, INHALE 2 PUFFS INTO THE LUNGS EVERY 6 HOURS AS NEEDED FOR SHORTNESS OF BREATH, Disp: 8.5 g, Rfl: 5 .  albuterol (PROVENTIL) (2.5 MG/3ML) 0.083% nebulizer solution, Take 3 mLs (2.5 mg total) by nebulization every 6 (six) hours as needed for wheezing or shortness of breath., Disp: 75 mL, Rfl: 2 .  blood glucose meter kit and supplies KIT, Dispense based on patient and insurance preference. Use up to four times daily as directed. (FOR ICD-9 250.00, 250.01)., Disp: 1 each, Rfl: 0 .  cetirizine (ZYRTEC) 10 MG tablet, Take 1 tablet (10 mg total) by mouth daily., Disp: 30 tablet, Rfl: 11 .  fluticasone (FLONASE) 50 MCG/ACT nasal spray, Place 2 sprays into both nostrils daily., Disp: , Rfl:  .  furosemide (LASIX) 40 MG tablet, Take 1 tablet (40 mg total) by mouth daily., Disp: 30 tablet, Rfl: 3 .  glucose blood (ACCU-CHEK AVIVA PLUS) test strip, USE UP TO 4 TIMES DAILY AS DIRECTED, Disp: 100 each, Rfl: 0 .  ibuprofen (ADVIL,MOTRIN) 200 MG tablet, Take 600 mg by mouth every 6 (six) hours as needed for moderate pain., Disp: , Rfl:  .  levothyroxine (SYNTHROID, LEVOTHROID) 125 MCG tablet, Take 2 tablets (250 mcg total) by mouth daily., Disp: 60 tablet, Rfl: 0 .  metFORMIN (GLUCOPHAGE) 500 MG tablet, Take 1 tablet (500 mg total) by mouth 2 (two) times daily with a meal., Disp: 180 tablet, Rfl: 2 .  Mometasone Furoate (ASMANEX HFA) 200 MCG/ACT AERO, Inhale 2 puffs into the lungs 2 (two) times  daily., Disp: 1 Inhaler, Rfl: 0 .  pantoprazole (PROTONIX) 40 MG tablet, Take 1 tablet (40 mg total) by mouth 2 (two) times daily before a meal., Disp: 60 tablet, Rfl: 3 .  ursodiol (ACTIGALL) 500 MG tablet, Take 1 tablet (500 mg total) by mouth 2 (two) times daily. (Patient not taking: Reported on 05/25/2018), Disp: 180 tablet, Rfl: 3      Objective:   Vitals:   07/12/18 1438  BP: 118/70  Pulse: 72  SpO2: 95%  Weight: 244 lb (110.7 kg)  Height: _0  (1.676 m)    Estimated body mass index is 39.38 kg/m as calculated from the following:   Height as of this encounter: _1  (1.676 m).   Weight as of this encounter: 244 lb (110.7 kg).  _2 @  [employer]   07/12/18 1438  Weight: 244 lb (110.7 kg)     Physical Exam  General Appearance:    Alert, cooperative, no distress, appears stated age - older , Deconditioned looking - yes , OBESE  - yes, Sitting on Wheelchair -  no  Head:    Normocephalic, without obvious abnormality, atraumatic  Eyes:    PERRL, conjunctiva/corneas clear,  Ears:    Normal TM's and external ear canals, both ears  Nose:   Nares normal, septum midline, mucosa normal, no drainage    or sinus tenderness. OXYGEN ON  -  NO . Patient is @ ra   Throat:   Lips, mucosa, and tongue normal; teeth and gums normal. Cyanosis on lips - no  Neck:   Supple, symmetrical, trachea midline, no adenopathy;    thyroid:  no enlargement/tenderness/nodules; no carotid   bruit or JVD  Back:     Symmetric, no curvature, ROM normal, no CVA tenderness  Lungs:     Distress - no , Wheeze no, Barrell Chest - no, Purse lip breathing - no, Crackles - YES LUNG BASE   Chest Wall:    No tenderness or deformity.    Heart:    Regular rate and rhythm, S1 and S2 normal, no rub   or gallop, Murmur - no  Breast Exam:    NOT DONE  Abdomen:     Soft, non-tender, bowel sounds active all four quadrants,    no masses, no organomegaly. Visceral obesity - yes  Genitalia:   NOT DONE  Rectal:    NOT DONE  Extremities:   Extremities - normal, Has Cane - no, Clubbing - YES, Edema - no  Pulses:   2+ and symmetric all extremities  Skin:   Stigmata of Connective Tissue Disease - no. SPIDER NEVII +  Lymph nodes:   Cervical, supraclavicular, and axillary nodes normal  Psychiatric:  Neurologic:   Pleasant - yes, Anxious - no, Flat affect - yes  CAm-ICU - neg, Alert and Oriented x 3 - yes, Moves all 4s - yes, Speech - normal, Cognition - intact . SPEAKS WITH  A LISP and sLOW           Assessment:       ICD-10-CM   1. Cirrhosis of liver without ascites, unspecified hepatic cirrhosis type ([place]) K74.60   2. ILD (interstitial lung disease) ([place]) J84.9   3. Positive ANA (antinuclear antibody) R76.8        Plan:     Patient Instructions  Cirrhosis of liver without ascites, unspecified hepatic cirrhosis type ([place])  - check cbc with difff, lft, inr - refer to [facility] - last seen 2015 by Dr Silvano Rusk    ILD (interstitial lung disease) ([place]) Positive ANA (antinuclear antibody)  - possibilities include IPF, IPAF v NSIP - progressive on CT summer 2018 -> fall 2019 but stable on breathing test during this time - recommend bronchoscopy with lavage (no biopsy due to borderline platelet level) for cell count  - do 07[facility] - late morning or 07/21/2018 thu at [facility] early afternoon   - need to be npo for 6h prior but can/should take medications pre-procedure - test ono room air at home  Followup 4 weeks In ILD clinic   - subjective symptom scale and walk test at followup   > 50% of this > 25 min visit spent in face to face counseling or coordination of care - by this undersigned MD - Dr Brand Males. This includes one or more of the following documented above: discussion of test results, diagnostic or treatment recommendations, prognosis, risks and benefits of management options, instructions, education, compliance or risk-factor  reduction     SIGNATURE    Dr. Brand Males, M.Mueller., F.C.C.P,  Pulmonary and Critical Care Medicine Staff Physician, [place] Director - Interstitial Lung Disease  Program  Pulmonary [place] [facility] [place], [place], [address]  Pager: [phone], If no answer or between  15:00h - 7:00h: call [phone]  [phone]  Telephone: [phone]  3:33 PM 07/12/2018

## 2018-07-12 NOTE — Addendum Note (Signed)
Addended by: Suzzanne Cloud E on: 07/12/2018 04:06 PM   Modules accepted: Orders

## 2018-07-12 NOTE — Addendum Note (Signed)
Addended by: Suzzanne Cloud E on: 07/12/2018 04:05 PM   Modules accepted: Orders

## 2018-07-12 NOTE — Addendum Note (Signed)
Addended by: Lorretta Harp on: 07/12/2018 05:12 PM   Modules accepted: Orders

## 2018-07-12 NOTE — H&P (View-Only) (Signed)
HPI Oc 2017  : Holly Mueller is a 53 year old with past history of asthma. She has poor control over the past year and needed to go on several tapers of prednisone. She was seen in the ED in July 2017 for an exacerbation. She is currently maintained on Symbicort and albuterol. She needs to use her albuterol every day. She denies any nighttime awakenings. She also has significant issues with sinusitis, postnasal drip with constant clearing of the throat. She has heartburn and is on Prilosec.   Interim History Aug 2018: Seen in ED in July 2018 for dyspnea, cough, sinus and throat congestion. CTA did not show PE, there was mild atelectasis, GGO at base. PFTs do not show obstruction. There is restriction but no ILD on CT Today in the office she has chronic cough, no dyspnea or wheezing  She is in being evaluated for cytopenia. She's had a bone marrow biopsy which did not show any abnormality. She has a right upper quadrant ultrasound and a GI Eval Pending for Evaluation of Cirrhosis.   Interim History feb 2019: Admitted from 2/4-06/19/17 with fevers, diagnosed with flu.  Treated with Tamiflu.  She was seen by GI at that time for evaluation of cirrhosis and recommended outpatient follow-up with Dr. Carlean Purl. Hospitalized again from 2/19-2/24/19 with cellulitis, lower extremity pain.  Workup was negative for PE and DVT.  She was treated with Keflex.  CT showed stable mild groundglass opacities consistent with volume overload.    Reports that breathing is stable.  Still continues on Symbicort and albuterol.  Chief complaint today is left lower extremity pain.  There is no swelling, erythema, fevers, chills.     Interim History May 2019: Hospitalized again and early May for chest pain, dyspnea.  She had a stress test which is normal. Also seen by pulmonary for CTA that was negative for PE but showed some chronic lung opacities. Evaluated for aspiration.  Swallow eval was normal Serologies  shows positive ANA, SSA    Nov 2019: Holly Mueller is a 53 year old with past history of asthma, cirrhosis, cytopenia, restrictive lung disease. Seen in the ED in July 2017 for an exacerbation. She is currently maintained on Symbicort and albuterol. She needs to use her albuterol every day. She denies any nighttime awakenings. She also has significant issues with sinusitis, postnasal drip with constant clearing of the throat. She has heartburn and is on Prilosec.  Admitted from 2/4-06/19/17 with fevers, diagnosed with flu.  Treated with Tamiflu.  She was seen by GI at that time for evaluation of cirrhosis and recommended outpatient follow-up with Dr. Carlean Purl. Hospitalized 2/19-2/24/19 with cellulitis, lower extremity pain.  Workup was negative for PE and DVT.  She was treated with Keflex.  CT showed stable mild groundglass opacities consistent with volume overload.    Hospitalized again in May 2019 for chest pain, dyspnea.  She had a stress test which is normal. Also seen by pulmonary for CTA that was negative for PE but showed some chronic lung opacities. Evaluated for aspiration.  Swallow eval was normal Serologies shows positive ANA, SSA  She has seen Dr. Lebron Conners, hematology for cytopenia status post bone marrow biopsy.  Her low blood counts are felt to be secondary to cirrhosis.  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 Exposures: Had exposed to mold in the past.  No dampness, heart type, Jacuzzi Smoking history: Denies smoking.  She is exposed to secondhand smoke Travel  history: No significant travel history Relevant family history: No significant family history of lung disease.   Interim History: Nov 2019 Chronic cough continues with mild dyspnea on exertion. Referred to Dr. Chase Caller by Dr, Joya Gaskins, primary care for second opinion regarding interstitial lung disease.   OV 05/25/2018  Subjective:  Patient ID: Holly Mueller, female , DOB: 09-25-1965 ,  age 45 y.o. , MRN: [id number] , ADDRESS: [place]  [id number]   05/25/2018 -   Chief Complaint  Patient presents with   Follow-up    Pt states things have been worse for her since last visit. Pt has complaints of SOB which happens almost at any time, has an occ cough with white foamy mucus, and also has some complaints of CP.     HPI Holly Mueller 53 y.o. -referred by Dr. Asencion Noble and Dr. Marshell Garfinkel for interstitial lung disease.  Is a very complex history  Much of the work-up is already been done.  The time spent was in reviewing the chart and talking to the patient.  It appears she has idiopathic cirrhosis [reviewed and confirmed to 2014 or 2015 liver biopsy showing cirrhosis with active inflammation].  She does not follow-up with a GI doctor.  Her platelets run around 50-60000.  She sees Dr. Neldon Mc for allergy.  She used to be on asthma inhaler but following repeated normal nitric oxide her Symbicort was stopped maybe approximately over a year ago.  She is not sure if the cough is worse after that.    Her current problem seems to be dyspnea on exertion for the last few to several years progressively getting worse.  She also has constant chronic cough.  She is not on oxygen.  She does not recollect having her overnight oxygen desaturation tested.  It appears a few years ago her CT scans were fairly clear and suggestive of volume overload but with time the CT scans of change.  Most recent CT scan of the chest in late 2019 is reported as probable UIP based on craniocaudal gradient and some traction bronchiectasis without honeycombing.  The findings are early and mild.  I personally visualized the CT and agree with the findings.  Rest of the ILD question is all worked up she is got trace positive autoimmune antibodies.  Rheumatology referral is pending and an appointment is pending according to the patient.  The only PFTs from July 2018 which suggest  restriction.      IMPRESSION: 1. The appearance of the lungs is compatible with interstitial lung disease, with a spectrum of findings categorized as probable usual interstitial pneumonia (UIP) based on current ATS guidelines. Repeat high-resolution chest CT is suggested in 12 months to assess for temporal changes in the appearance of the lung parenchyma. 2. Aortic atherosclerosis. 3. Cirrhosis with stigmata of portal venous hypertension, including splenomegaly.  Aortic Atherosclerosis (ICD10-I70.0).   Electronically Signed   By: Vinnie Langton M.D.   On: 04/05/2018 18:24   Results for MARIS, ABASCAL (MRN [id number]) as of 05/25/2018 10:40  Ref. Range 10/06/2017 16:42  IgE (Immunoglobulin E), Serum Latest Ref Range: <OR=114 kU/L <2  ASPERGILLUS FUMIGATUS Latest Ref Range: NEGATIVE  NEGATIVE  [place] Serum Latest Ref Range: NEGATIVE  NEGATIVE  Anti Nuclear Antibody(ANA) Latest Ref Range: NEGATIVE  POSITIVE (A)  ANA Pattern 1 Unknown SPECKLED (A)  ANA Titer 1 Latest Units: titer 0:88 (H)  Cyclic Citrullin Peptide Ab Latest Units: UNITS <16  ds DNA Ab Latest Units: IU/mL  2  RA Latex Turbid. Latest Ref Range: <14 IU/mL <14  ENA SM Ab Ser-aCnc Latest Ref Range: <1.0 NEG AI <1.0 NEG  SSA (Ro) (ENA) Antibody, IgG Latest Ref Range: <1.0 NEG AI >8.0 POS (A)  SSB (La) (ENA) Antibody, IgG Latest Ref Range: <1.0 NEG AI <1.0 NEG  Scleroderma (Scl-70) (ENA) Antibody, IgG Latest Ref Range: <1.0 NEG AI <1.0 NEG  SM/RNP Latest Ref Range: <1.0 NEG AI <1.0 NEG    Cirrhosis labs - Results for Holly, Mueller (MRN [id number]) as of 05/25/2018 12:00  Ref. Range 04/26/2018 23:47  Platelets Latest Ref Range: 150 - 400 K/uL 51 (L)    OV 07/12/2018  Subjective:  Patient ID: Holly Mueller, female , DOB: 01/29/1966 , age 18 y.o. , MRN: [id number] , ADDRESS: [place] [id number]   07/12/2018 -   Chief Complaint  Patient presents with   Follow-up    PFT done today. She states  her breathing has been okay. She states she has good days and bad days but she makes sure to pace herself. Recenly seen in ED for URI.    Follow-up probable UIP on CT scan of the chest November 2019 with worsening compared to July 2018.  Female gender.  Positive ANA and SSA associated with clubbing and bilateral basal Velcro crackles -definition of ILD in progress.  Associated with cirrhosis and low platelets, bili 2.2 idiopathic variety  HPI Holly Mueller 53 y.o. -returns for follow-up of her ILD.  In the interim we discussed her case at the multidisciplinary case conference.  The consensus was that she did indeed have probable UIP on the CT scan [greater than 80% probability that this is indeed UIP].  Radiologist was concerned about progression between summer 2018 and the fall 2019 based on CT scan appearance.  In the interim overall she is stable although since seeing me she is having respiratory exacerbation has been treated with antibiotic and prednisone.  The prednisone is still continuing with tomorrow being the last day.  I reviewed the primary care physician chart and confirmed it.  She is beginning to feel close to baseline.  She had pulmonary function test today that shows very similar values compared to summer 2018.  Therefore in essence she is stable on PFTs compared to summer 2018 but the decline is on the CT scan.  Her most recent liver work shows a rising bilirubin.  She says she has not seen a hepatologist or GI specialist in a few years.  She is willing to undergo bronchoscopy to narrow down the specific variety of ILD.  We discussed her tolerance to sedation.  She does not have any sleep apnea.  Her last procedure was 3 years ago and 5 years ago .  During this time she has held well with sedation procedures.      Simple office walk 250 feet x  3 laps goal with forehead probe 05/25/2018   O2 used Room air  Number laps completed 3  Comments about pace slow  Resting Pulse Ox/HR 100%  and 70/min  Final Pulse Ox/HR 94% and 92/min  Desaturated </= 88% no  Desaturated <= 3% points Yes, 6 points  Got Tachycardic >/= 90/min yes  Symptoms at end of test Mild dyspnea final lap  Miscellaneous comments x    Results for Holly, Mueller (MRN [id number]) as of 07/12/2018 14:55  Ref. Range 11/13/2016 11:21 07/12/2018 13:58  FVC-Pre Latest Units: L 1.80 1.87  FVC-%Pred-Pre Latest  Units: % 48 50  Results for Holly, Mueller (MRN [id number]) as of 07/12/2018 14:55  Ref. Range 07/12/2018 13:58  DLCO cor Latest Units: ml/min/mmHg 15.56  DLCO cor % pred pre Latest Units: % 69   Results for Holly, Mueller (MRN [id number]) as of 07/12/2018 14:55  Ref. Range 11/30/2006 14:26 04/27/2007 17:19 08/10/2007 14:05 12/18/2008 00:19 12/18/2008 00:42 10/21/2009 09:00 10/21/2009 09:16 12/12/2009 00:00 02/03/2010 08:28 04/24/2011 09:50 08/19/2011 14:24 01/25/2012 10:20 05/26/2012 11:19 06/27/2012 10:35 07/31/2012 13:06 10/31/2012 10:30 10/31/2012 12:20 01/18/2013 15:07 04/25/2013 15:41 07/13/2013 12:20 11/22/2013 13:34 02/08/2014 12:11 03/06/2016 11:48 10/23/2016 11:52 11/17/2016 12:04 11/23/2016 11:11 12/02/2016 08:58 04/05/2017 14:55 06/14/2017 16:29 06/16/2017 05:37 06/17/2017 04:53 06/18/2017 05:37 06/19/2017 04:03 06/29/2017 18:42 06/30/2017 08:19 07/01/2017 03:37 07/02/2017 08:02 07/03/2017 03:03 07/04/2017 02:44 07/20/2017 08:50 08/04/2017 20:50 09/10/2017 15:30 09/10/2017 23:19 09/11/2017 06:30 09/12/2017 04:38 09/13/2017 08:18 10/05/2017 14:22 12/28/2017 19:49 12/29/2017 05:01 12/30/2017 06:10 12/31/2017 05:19 01/01/2018 04:42 01/02/2018 04:12 01/03/2018 05:10 01/03/2018 11:25 01/04/2018 06:41 01/05/2018 04:36 01/12/2018 10:52 03/03/2018 11:08 03/12/2018 10:21 04/26/2018 23:47 06/27/2018 16:17  Platelets Latest Ref Range: 150 - 400 K/uL 262 219 234 208  175  227  157  PLATELET CLUMPS NOTED ON SMEAR, COUNT APPEARS DECREASED 130 (L) 127 (L) PLATELET CLUMPS NOTED ON SMEAR, UNABLE TO ESTIMATE 126 (L) PLATELET CLUMPS NOTED ON SMEAR, COUNT APPEARS DECREASED 143 (L) 164 115 (L)  105 (L)  78.0 (L) 73 (LL) 86 (L) 79 (L) 66 (L) 71 (L) 48 (L) 49 (L) 52 (L) 51 (L) 49 (L) 90 (L) 86 (L) 87 (L) 98 (L) 109 (L) 104 (L) 79 (L) PLATELET CLUMPS NOTED ON SMEAR, COUNT APPEARS DECREASED 65 (LL) 60 (L) 59 (L) 67 (L) 68 (L) 71 (LL) 43 (L) 50 (L) 49 (L) 59 (L) 62 (L) 79 (L) 65 (L) 56 (L) 56 (L) 55 (L) 81 (LL) 59 (LL) 62 (L) 51 (L) 66 (L)    ROS - per HPI     has a past medical history of Arthritis, Diabetes mellitus without complication ([place]), Edentulous, Epilepsy ([place]) (in 1972), Fever (06/2017), GERD (gastroesophageal reflux disease) (2008), Headache(784.0), Hypothyroidism (2008), Lower esophageal ring (08/18/2013), Seasonal allergies (2003), and Shortness of breath.   reports that she quit smoking about 23 years ago. Her smoking use included cigarettes. She smoked 0.25 packs per day. She has never used smokeless tobacco.  Past Surgical History:  Procedure Laterality Date   BALLOON DILATION N/A 10/24/2013   Procedure: BALLOON DILATION;  Surgeon: Gatha Mayer, MD;  Location: [facility];  Service: Endoscopy;  Laterality: N/A;   CESAREAN SECTION     x2   DENTAL SURGERY     multiple extractions 3'15   ESOPHAGOGASTRODUODENOSCOPY N/A 10/24/2013   Procedure: ESOPHAGOGASTRODUODENOSCOPY (EGD);  Surgeon: Gatha Mayer, MD;  Location: [facility];  Service: Endoscopy;  Laterality: N/A;   LIVER BIOPSY  06/30/2012   Procedure: LIVER BIOPSY;  Surgeon: Shann Medal, MD;  Location: [facility];  Service: General;;   NOVASURE ABLATION     SHOULDER ARTHROSCOPY Right 4742   UMBILICAL HERNIA REPAIR N/A 06/30/2012   Procedure: remove umbilicus;  Surgeon: Shann Medal, MD;  Location: [facility];  Service: General;  Laterality: N/A;   VENTRAL HERNIA REPAIR N/A 06/30/2012   Procedure: LAPAROSCOPIC VENTRAL HERNIA;  Surgeon: Shann Medal, MD;  Location: [facility];  Service: General;  Laterality: N/A;  With Mesh   WISDOM TOOTH EXTRACTION      Allergies  Allergen Reactions   Aspirin Nausea Only    Upset  stomach    Immunization History  Administered Date(s) Administered   Pneumococcal Polysaccharide-23 08/26/2011, 09/12/2017   Td 05/11/2006    Family History  Problem Relation Age of Onset   Hypertension Mother    Diabetes Mother    Lung cancer Father    Stroke Father    Diabetes Sister    Hypertension Sister      Current Outpatient Medications:    ACCU-CHEK SOFTCLIX LANCETS lancets, USE UP TO 4 TIMES DAILY AS DIRECTED, Disp: 100 each, Rfl: 0   albuterol (PROAIR HFA) 108 (90 Base) MCG/ACT inhaler, INHALE 2 PUFFS INTO THE LUNGS EVERY 6 HOURS AS NEEDED FOR SHORTNESS OF BREATH, Disp: 8.5 g, Rfl: 5   albuterol (PROVENTIL) (2.5 MG/3ML) 0.083% nebulizer solution, Take 3 mLs (2.5 mg total) by nebulization every 6 (six) hours as needed for wheezing or shortness of breath., Disp: 75 mL, Rfl: 2   blood glucose meter kit and supplies KIT, Dispense based on patient and insurance preference. Use up to four times daily as directed. (FOR ICD-9 250.00, 250.01)., Disp: 1 each, Rfl: 0   cetirizine (ZYRTEC) 10 MG tablet, Take 1 tablet (10 mg total) by mouth daily., Disp: 30 tablet, Rfl: 11   fluticasone (FLONASE) 50 MCG/ACT nasal spray, Place 2 sprays into both nostrils daily., Disp: , Rfl:    furosemide (LASIX) 40 MG tablet, Take 1 tablet (40 mg total) by mouth daily., Disp: 30 tablet, Rfl: 3   glucose blood (ACCU-CHEK AVIVA PLUS) test strip, USE UP TO 4 TIMES DAILY AS DIRECTED, Disp: 100 each, Rfl: 0   ibuprofen (ADVIL,MOTRIN) 200 MG tablet, Take 600 mg by mouth every 6 (six) hours as needed for moderate pain., Disp: , Rfl:    levothyroxine (SYNTHROID, LEVOTHROID) 125 MCG tablet, Take 2 tablets (250 mcg total) by mouth daily., Disp: 60 tablet, Rfl: 0   metFORMIN (GLUCOPHAGE) 500 MG tablet, Take 1 tablet (500 mg total) by mouth 2 (two) times daily with a meal., Disp: 180 tablet, Rfl: 2   Mometasone Furoate (ASMANEX HFA) 200 MCG/ACT AERO, Inhale 2 puffs into the lungs 2 (two) times  daily., Disp: 1 Inhaler, Rfl: 0   pantoprazole (PROTONIX) 40 MG tablet, Take 1 tablet (40 mg total) by mouth 2 (two) times daily before a meal., Disp: 60 tablet, Rfl: 3   ursodiol (ACTIGALL) 500 MG tablet, Take 1 tablet (500 mg total) by mouth 2 (two) times daily. (Patient not taking: Reported on 05/25/2018), Disp: 180 tablet, Rfl: 3      Objective:   Vitals:   07/12/18 1438  BP: 118/70  Pulse: 72  SpO2: 95%  Weight: 244 lb (110.7 kg)  Height: 5' 6" (1.676 m)    Estimated body mass index is 39.38 kg/m as calculated from the following:   Height as of this encounter: 5' 6" (1.676 m).   Weight as of this encounter: 244 lb (110.7 kg).  _0 @  Filed Weights   07/12/18 1438  Weight: 244 lb (110.7 kg)     Physical Exam  General Appearance:    Alert, cooperative, no distress, appears stated age - older , Deconditioned looking - yes , OBESE  - yes, Sitting on Wheelchair -  no  Head:    Normocephalic, without obvious abnormality, atraumatic  Eyes:    PERRL, conjunctiva/corneas clear,  Ears:    Normal TM's and external ear canals, both ears  Nose:   Nares normal, septum midline, mucosa normal, no drainage    or sinus tenderness. OXYGEN ON  -  NO . Patient is @ ra   Throat:   Lips, mucosa, and tongue normal; teeth and gums normal. Cyanosis on lips - no  Neck:   Supple, symmetrical, trachea midline, no adenopathy;    thyroid:  no enlargement/tenderness/nodules; no carotid   bruit or JVD  Back:     Symmetric, no curvature, ROM normal, no CVA tenderness  Lungs:     Distress - no , Wheeze no, Barrell Chest - no, Purse lip breathing - no, Crackles - YES LUNG BASE   Chest Wall:    No tenderness or deformity.    Heart:    Regular rate and rhythm, S1 and S2 normal, no rub   or gallop, Murmur - no  Breast Exam:    NOT DONE  Abdomen:     Soft, non-tender, bowel sounds active all four quadrants,    no masses, no organomegaly. Visceral obesity - yes  Genitalia:   NOT DONE  Rectal:    NOT DONE  Extremities:   Extremities - normal, Has Cane - no, Clubbing - YES, Edema - no  Pulses:   2+ and symmetric all extremities  Skin:   Stigmata of Connective Tissue Disease - no. SPIDER NEVII +  Lymph nodes:   Cervical, supraclavicular, and axillary nodes normal  Psychiatric:  Neurologic:   Pleasant - yes, Anxious - no, Flat affect - yes  CAm-ICU - neg, Alert and Oriented x 3 - yes, Moves all 4s - yes, Speech - normal, Cognition - intact . SPEAKS WITH  A LISP and sLOW           Assessment:       ICD-10-CM   1. Cirrhosis of liver without ascites, unspecified hepatic cirrhosis type ([place]) K74.60   2. ILD (interstitial lung disease) ([place]) J84.9   3. Positive ANA (antinuclear antibody) R76.8        Plan:     Patient Instructions  Cirrhosis of liver without ascites, unspecified hepatic cirrhosis type ([place])  - check cbc with difff, lft, inr - refer to [facility] - last seen 2015 by Dr Silvano Rusk    ILD (interstitial lung disease) ([place]) Positive ANA (antinuclear antibody)  - possibilities include IPF, IPAF v NSIP - progressive on CT summer 2018 -> fall 2019 but stable on breathing test during this time - recommend bronchoscopy with lavage (no biopsy due to borderline platelet level) for cell count  - do 07/20/2018 at [facility] - late morning or 07/21/2018 thu at [place] early afternoon   - need to be npo for 6h prior but can/should take medications pre-procedure - test ono room air at home  Followup 4 weeks In ILD clinic   - subjective symptom scale and walk test at followup   > 50% of this > 25 min visit spent in face to face counseling or coordination of care - by this undersigned MD - Dr Brand Males. This includes one or more of the following documented above: discussion of test results, diagnostic or treatment recommendations, prognosis, risks and benefits of management options, instructions, education, compliance or risk-factor  reduction     SIGNATURE    Dr. Brand Males, M.D., F.C.C.P,  Pulmonary and Critical Care Medicine Staff Physician, [place] Director - Interstitial Lung Disease  Program  Pulmonary [place] [facility] [place], [place], [address]  Pager: [phone], If no answer or between  15:00h - 7:00h: call [phone]  Telephone: [phone]  3:33 PM 07/12/2018

## 2018-07-12 NOTE — Addendum Note (Signed)
Addended by: Suzzanne Cloud E on: 07/12/2018 04:04 PM   Modules accepted: Orders

## 2018-07-12 NOTE — Addendum Note (Signed)
Addended by: Suzzanne Cloud E on: 07/12/2018 05:12 PM   Modules accepted: Orders

## 2018-07-12 NOTE — Addendum Note (Signed)
Addended by: Aashi Derrington E on: 07/12/2018 04:05 PM   Modules accepted: Orders  

## 2018-07-13 ENCOUNTER — Telehealth: Payer: Self-pay | Admitting: Internal Medicine

## 2018-07-13 LAB — PROTIME-INR
INR: 1.1
Prothrombin Time: 11.8 s — ABNORMAL HIGH (ref 9.0–11.5)

## 2018-07-13 LAB — HEPATIC FUNCTION PANEL
ALK PHOS: 164 U/L — AB (ref 39–117)
ALT: 53 U/L — ABNORMAL HIGH (ref 0–35)
AST: 64 U/L — ABNORMAL HIGH (ref 0–37)
Albumin: 3.3 g/dL — ABNORMAL LOW (ref 3.5–5.2)
Bilirubin, Direct: 0.6 mg/dL — ABNORMAL HIGH (ref 0.0–0.3)
Total Bilirubin: 1.8 mg/dL — ABNORMAL HIGH (ref 0.2–1.2)
Total Protein: 8.1 g/dL (ref 6.0–8.3)

## 2018-07-13 LAB — CBC WITH DIFFERENTIAL/PLATELET
Basophils Absolute: 0 10*3/uL (ref 0.0–0.1)
Basophils Relative: 0 % (ref 0.0–3.0)
Eosinophils Absolute: 0 10*3/uL (ref 0.0–0.7)
Eosinophils Relative: 3.3 % (ref 0.0–5.0)
HCT: 36.3 % (ref 36.0–46.0)
Hemoglobin: 12.2 g/dL (ref 12.0–15.0)
Lymphocytes Relative: 61.2 % — ABNORMAL HIGH (ref 12.0–46.0)
Lymphs Abs: 0.6 10*3/uL — ABNORMAL LOW (ref 0.7–4.0)
MCHC: 33.7 g/dL (ref 30.0–36.0)
MCV: 86.1 fl (ref 78.0–100.0)
Monocytes Absolute: 0.2 10*3/uL (ref 0.1–1.0)
Monocytes Relative: 18.2 % — ABNORMAL HIGH (ref 3.0–12.0)
Neutro Abs: 0.2 10*3/uL — ABNORMAL LOW (ref 1.4–7.7)
Neutrophils Relative %: 17.3 % — ABNORMAL LOW (ref 43.0–77.0)
Platelets: 74 10*3/uL — ABNORMAL LOW (ref 150.0–400.0)
RBC: 4.21 Mil/uL (ref 3.87–5.11)
RDW: 15.8 % — ABNORMAL HIGH (ref 11.5–15.5)
WBC: 0.9 10*3/uL — CL (ref 4.0–10.5)

## 2018-07-13 NOTE — Telephone Encounter (Signed)
Received a call report from the [facility].   Patient's WBC is 0.9.   Will route to MR.

## 2018-07-14 ENCOUNTER — Encounter (HOSPITAL_COMMUNITY): Payer: Self-pay | Admitting: *Deleted

## 2018-07-15 ENCOUNTER — Other Ambulatory Visit: Payer: Self-pay

## 2018-07-15 ENCOUNTER — Telehealth: Payer: Self-pay | Admitting: Internal Medicine

## 2018-07-15 ENCOUNTER — Ambulatory Visit (INDEPENDENT_AMBULATORY_CARE_PROVIDER_SITE_OTHER): Payer: Self-pay | Admitting: Family Medicine

## 2018-07-15 VITALS — BP 110/64 | Temp 98.5°F | Wt 244.0 lb

## 2018-07-15 DIAGNOSIS — R609 Edema, unspecified: Secondary | ICD-10-CM | POA: Insufficient documentation

## 2018-07-15 DIAGNOSIS — J849 Interstitial pulmonary disease, unspecified: Secondary | ICD-10-CM

## 2018-07-15 MED ORDER — AMOXICILLIN-POT CLAVULANATE 875-125 MG PO TABS: 1.0000 | ORAL_TABLET | Freq: Two times a day (BID) | ORAL | 0 refills | Status: DC

## 2018-07-15 MED ORDER — OXYCODONE HCL 5 MG PO TABS: 5.0000 mg | ORAL_TABLET | Freq: Four times a day (QID) | ORAL | 0 refills | Status: AC | PRN

## 2018-07-15 NOTE — Progress Notes (Signed)
\  Subjective:    Patient ID: Holly Mueller, female    DOB: [birth year], 53 y.o.   MRN: [id number]   CC: left jaw swelling and pain  HPI: patient had left jaw swelling last night that was mildly tender. She reports she has had some intermittent swelling of her jaw in past and did not think much of it. This morning the swelling was bigger and very tender when she tried to eat or drink anything. She could not tolerate a cough drop without intense pain. No redness of face. No fevers or chills but patient feels generally run down. She has had several illnesses in past month including sinusitis treated with augmentin and bronchitis requiring steroids. She reports intermittent sore throat over past 2 weeks. She has had her teeth removed due to frequent dental infections/dental caries. She is currently being worked up by [facility] for ILD and is scheduled for bronch next week. Denies any history of tobacco use  Smoking status reviewed- never smoker  Review of Systems- no fevers or chills, no nausea or vomiting, no difficulty swallowing, no muffled voice or voice changes   Objective:  BP 110/64   Temp 98.5 F (36.9 C) (Oral)   Wt 244 lb (110.7 kg)   BMI 39.38 kg/m  Vitals and nursing note reviewed  General: well nourished, in no acute distress HEENT: edentulous. normocephalic, TM's visualized bilaterally without erythema or exudate, MMM, no erythema or discharge noted in posterior oropharynx. Swelling of parotid region on left, tender to palpation, firm to touch, no fluctuance appreciated, unable to express any purulence Neck: supple, non-tender, without lymphadenopathy Cardiac: RRR, clear S1 and S2, no murmurs, rubs, or gallops Respiratory: clear to auscultation bilaterally, no increased work of breathing Skin: warm and dry, no rashes noted Neuro: alert and oriented, no focal deficits   Assessment & Plan:    Parotid swelling  Well appearing, afebrile, well hydrated on exam. Consistent  with parotitis, no purulence noted, discussed using warm compresses, gentle massage, tylenol as needed for pain. rx for augmentin sent to pharmacy. Also did 3 day supply of oxycodone given severe pain. Discussed that patient needs to go to ED if she is unable to tolerate liquids by mouth. We will see her back Monday in office to recheck. If no improvement or if worsening would obtain US or CT of parotid region to identify if there is a stone obstructing. Patient verbalized understanding and agreement with plan.   Precepted with Dr. McDiarmid.  Return in about 3 days (around 07/18/2018).   Lucila Maine, DO Family Medicine Resident PGY-3

## 2018-07-15 NOTE — Telephone Encounter (Signed)
She was seen 07/15/2018 for parotoitis at PCP  [name], MD office and maybe that is why wbc was low. I will send this to PCP as well. But she has bronch next week 311/2020. Her platelets and INR are safe for bronch. Hoever, she should go on 07/19/2018 and get another CBC done at closest lab. Her address is [place] [address]   Results for Holly Mueller, Holly Mueller (MRN [id number]) as of 07/15/2018 16:48  Ref. Range 07/12/2018 17:13  Alkaline Phosphatase Latest Ref Range: 39 - 117 U/L 164 (H)  Albumin Latest Ref Range: 3.5 - 5.2 g/dL 3.3 (L)  AST Latest Ref Range: 0 - 37 U/L 64 (H)  ALT Latest Ref Range: 0 - 35 U/L 53 (H)  Total Protein Latest Ref Range: 6.0 - 8.3 g/dL 8.1  Bilirubin, Direct Latest Ref Range: 0.0 - 0.3 mg/dL 0.6 (H)  Total Bilirubin Latest Ref Range: 0.2 - 1.2 mg/dL 1.8 (H)  WBC Latest Ref Range: 4.0 - 10.5 K/uL 0.9 Repeated and verified X2. (LL)  RBC Latest Ref Range: 3.87 - 5.11 Mil/uL 4.21  Hemoglobin Latest Ref Range: 12.0 - 15.0 g/dL 12.2  HCT Latest Ref Range: 36.0 - 46.0 % 36.3  MCV Latest Ref Range: 78.0 - 100.0 fl 86.1  MCHC Latest Ref Range: 30.0 - 36.0 g/dL 33.7  RDW Latest Ref Range: 11.5 - 15.5 % 15.8 (H)  Platelets Latest Ref Range: 150.0 - 400.0 K/uL 74.0 (L)  Neutrophils Latest Ref Range: 43.0 - 77.0 % 17.3 (L)  Lymphocytes Latest Ref Range: 12.0 - 46.0 % 61.2 (H)  Monocytes Relative Latest Ref Range: 3.0 - 12.0 % 18.2 (H)  Eosinophil Latest Ref Range: 0.0 - 5.0 % 3.3  Basophil Latest Ref Range: 0.0 - 3.0 % 0.0  NEUT# Latest Ref Range: 1.4 - 7.7 K/uL 0.2 (L)  Lymphocyte # Latest Ref Range: 0.7 - 4.0 K/uL 0.6 (L)  Monocyte # Latest Ref Range: 0.1 - 1.0 K/uL 0.2  Eosinophils Absolute Latest Ref Range: 0.0 - 0.7 K/uL 0.0  Basophils Absolute Latest Ref Range: 0.0 - 0.1 K/uL 0.0

## 2018-07-15 NOTE — Assessment & Plan Note (Signed)
  Well appearing, afebrile, well hydrated on exam. Consistent with parotitis, no purulence noted, discussed using warm compresses, gentle massage, tylenol as needed for pain. rx for augmentin sent to pharmacy. Also did 3 day supply of oxycodone given severe pain. Discussed that patient needs to go to ED if she is unable to tolerate liquids by mouth. We will see her back Monday in office to recheck. If no improvement or if worsening would obtain US or CT of parotid region to identify if there is a stone obstructing. Patient verbalized understanding and agreement with plan.

## 2018-07-15 NOTE — Patient Instructions (Signed)
Parotitis  Parotitis means that you have irritation and swelling (inflammation) in one or both of your parotid glands. These glands make saliva. They are found on each side of your face, below and in front of your earlobes. You may or may not have pain with this condition. What are the causes? This condition may be caused by:  Infections from germs (bacteria or viruses).  Something blocking the flow of saliva through the parotid glands. This can be a stone, scar tissue, or a tumor.  Diseases that cause your body's defense system (immune system) to attack healthy cells in your salivary glands. These are called autoimmune diseases. What increases the risk? You are more likely to get this condition if:  You are 51 years old or older.  You do not drink enough fluids (are dehydrated).  You drink too much alcohol.  You have: ? A dry mouth. ? Diabetes. ? Gout. ? A long-term illness.  You do not take good care of your mouth and teeth (poor dental hygiene).  You have had radiation treatments to the head and neck.  You take certain medicines. What are the signs or symptoms? Symptoms of this condition depend on the cause. They may include:  Swelling under and in front of the ear. This may get worse after you eat.  Redness of the skin over the parotid gland.  Pain and tenderness over the parotid gland. This may get worse after you eat.  Fever or chills.  Pus coming from the ducts inside the mouth.  Dry mouth.  A bad taste in the mouth. How is this treated? Treatment for this condition depends on the cause. Treatment may include:  Antibiotic medicine for an infection from bacteria.  Drinking more fluids.  Removing a stone or obstruction.  Treating a disease that is causing parotitis.  Surgery to drain an infection, remove a growth, or remove the whole gland. Treatment may not be needed if the swelling goes away with home care. Follow these instructions at  home: Medicines   Take over-the-counter and prescription medicines only as told by your doctor.  If you were prescribed an antibiotic medicine, take it as told by your doctor. Do not stop taking the antibiotic even if you start to feel better. Managing pain and swelling  If told, put heat on the affected area. Do this as often as told by your doctor. Use the heat source that your doctor recommends, such as a moist heat pack or a heating pad. ? Place a towel between your skin and the heat source. ? Leave the heat on for 20-30 minutes. ? Remove the heat if your skin turns bright red. This is very important if you are unable to feel pain, heat, or cold. You may have a greater risk of getting burned.  Gargle with salt water 3-4 times a day or as needed. To make salt water, dissolve -1 tsp (3-6 g) of salt in 1 cup (237 mL) of warm water.  Gently rub your parotid glands as told by your doctor. General instructions   Drink enough fluid to keep your pee (urine) pale yellow.  Keep your mouth clean and moist.  Suck on sour candy. This may help to: ? Make your mouth less dry. ? Make more saliva.  Take good care of your mouth: ? Brush your teeth at least two times a day. ? Floss your teeth every day. ? See your dentist regularly.  Do not use any products that contain nicotine or  tobacco. These include cigarettes, e-cigarettes, and chewing tobacco. If you need help quitting, ask your doctor.  Do not drink alcohol.  Keep all follow-up visits as told by your doctor. This is important. Contact a doctor if:  You have a fever or chills.  You have new symptoms.  Your symptoms get worse.  Your symptoms do not get better with treatment. Get help right away if:  You have trouble breathing or swallowing. Summary  Parotitis means that you have irritation and swelling (inflammation) in one or both of your parotid glands.  Symptoms include pain and swelling under and in front of the  ear.  Treatment for parotitis depends on the cause. In some cases, the condition may go away on its own with home care.  You should drink plenty of fluids, take good care of your mouth, and avoid tobacco products. This information is not intended to replace advice given to you by your health care provider. Make sure you discuss any questions you have with your health care provider. Document Released: 05/30/2010 Document Revised: 11/23/2017 Document Reviewed: 11/23/2017 Elsevier Interactive Patient Education  [employer].

## 2018-07-15 NOTE — Telephone Encounter (Signed)
Called patient x2 and phone kept ringing busy. Will route over to [facility] to follow up.

## 2018-07-18 ENCOUNTER — Ambulatory Visit: Payer: Medicaid Other

## 2018-07-18 NOTE — Telephone Encounter (Signed)
Called patient, unable to reach as phone kept ringing busy.

## 2018-07-18 NOTE — Telephone Encounter (Signed)
Called patient unable to reach and unable to leave message as phone kept ringing. See other phone note

## 2018-07-18 NOTE — Telephone Encounter (Signed)
Pt is calling back [phone]

## 2018-07-18 NOTE — Telephone Encounter (Signed)
Attempted to call pt on number listed for mobile phone but line just kept ringing busy.  Attempted to call pt on number listed for home phone but unable to reach pt. Left message for pt to return call.

## 2018-07-18 NOTE — Telephone Encounter (Signed)
Pt is calling back [phone]

## 2018-07-18 NOTE — Telephone Encounter (Signed)
Called patient unable to reach and unable to leave voicemail

## 2018-07-18 NOTE — Telephone Encounter (Signed)
Brand Males, MD       4:52 PM  Note    She was seen 07/15/2018 for parotoitis at PCP  [name], MD office and maybe that is why wbc was low. I will send this to PCP as well. But she has bronch next week 311/2020. Her platelets and INR are safe for bronch. Hoever, she should go on 07/19/2018 and get another CBC done at closest lab. [place] address is [place] [address]     _______________________________________  Hulen Skains and spoke with patient she is aware and verbalized understanding. Order has been placed. Patient will be in tomorrow for this.

## 2018-07-18 NOTE — Telephone Encounter (Signed)
Pt is calling back [phone]//[phone]

## 2018-07-19 ENCOUNTER — Other Ambulatory Visit (INDEPENDENT_AMBULATORY_CARE_PROVIDER_SITE_OTHER): Payer: Self-pay

## 2018-07-19 DIAGNOSIS — J849 Interstitial pulmonary disease, unspecified: Secondary | ICD-10-CM

## 2018-07-19 LAB — CBC WITH DIFFERENTIAL/PLATELET
BASOS PCT: 1.6 % (ref 0.0–3.0)
Basophils Absolute: 0 10*3/uL (ref 0.0–0.1)
Eosinophils Absolute: 0.1 10*3/uL (ref 0.0–0.7)
Eosinophils Relative: 3.9 % (ref 0.0–5.0)
HCT: 33.3 % — ABNORMAL LOW (ref 36.0–46.0)
Hemoglobin: 11.5 g/dL — ABNORMAL LOW (ref 12.0–15.0)
Lymphocytes Relative: 26.7 % (ref 12.0–46.0)
Lymphs Abs: 0.5 10*3/uL — ABNORMAL LOW (ref 0.7–4.0)
MCHC: 34.6 g/dL (ref 30.0–36.0)
MCV: 84.6 fl (ref 78.0–100.0)
Monocytes Absolute: 0.2 10*3/uL (ref 0.1–1.0)
Monocytes Relative: 12.3 % — ABNORMAL HIGH (ref 3.0–12.0)
Neutro Abs: 1.1 10*3/uL — ABNORMAL LOW (ref 1.4–7.7)
Neutrophils Relative %: 55.5 % (ref 43.0–77.0)
Platelets: 68 10*3/uL — ABNORMAL LOW (ref 150.0–400.0)
RBC: 3.93 Mil/uL (ref 3.87–5.11)
RDW: 15.5 % (ref 11.5–15.5)
WBC: 2 10*3/uL — ABNORMAL LOW (ref 4.0–10.5)

## 2018-07-20 ENCOUNTER — Encounter (HOSPITAL_COMMUNITY): Payer: Self-pay | Admitting: Respiratory Therapy

## 2018-07-20 ENCOUNTER — Encounter (HOSPITAL_COMMUNITY)
Admission: RE | Disposition: A | Discharge status: Home / Self Care | Payer: Self-pay | Source: Home / Self Care | Attending: Internal Medicine

## 2018-07-20 ENCOUNTER — Ambulatory Visit (HOSPITAL_COMMUNITY)
Admission: RE | Admit: 2018-07-20 | Discharge: 2018-07-20 | Disposition: A | Discharge status: Home / Self Care | Payer: Medicaid Other | Attending: Internal Medicine | Admitting: Internal Medicine

## 2018-07-20 ENCOUNTER — Ambulatory Visit (HOSPITAL_COMMUNITY)
Admission: RE | Admit: 2018-07-20 | Discharge: 2018-07-20 | Disposition: A | Discharge status: Home / Self Care | Payer: Medicaid Other | Source: Ambulatory Visit | Attending: Internal Medicine | Admitting: Internal Medicine

## 2018-07-20 ENCOUNTER — Other Ambulatory Visit: Payer: Self-pay

## 2018-07-20 DIAGNOSIS — J849 Interstitial pulmonary disease, unspecified: Secondary | ICD-10-CM | POA: Insufficient documentation

## 2018-07-20 DIAGNOSIS — Z87891 Personal history of nicotine dependence: Secondary | ICD-10-CM | POA: Insufficient documentation

## 2018-07-20 DIAGNOSIS — Z79890 Hormone replacement therapy (postmenopausal): Secondary | ICD-10-CM | POA: Insufficient documentation

## 2018-07-20 DIAGNOSIS — Z7984 Long term (current) use of oral hypoglycemic drugs: Secondary | ICD-10-CM | POA: Insufficient documentation

## 2018-07-20 DIAGNOSIS — E119 Type 2 diabetes mellitus without complications: Secondary | ICD-10-CM | POA: Insufficient documentation

## 2018-07-20 DIAGNOSIS — Z79899 Other long term (current) drug therapy: Secondary | ICD-10-CM | POA: Insufficient documentation

## 2018-07-20 DIAGNOSIS — K219 Gastro-esophageal reflux disease without esophagitis: Secondary | ICD-10-CM | POA: Insufficient documentation

## 2018-07-20 DIAGNOSIS — E039 Hypothyroidism, unspecified: Secondary | ICD-10-CM | POA: Insufficient documentation

## 2018-07-20 DIAGNOSIS — Z886 Allergy status to analgesic agent status: Secondary | ICD-10-CM | POA: Insufficient documentation

## 2018-07-20 DIAGNOSIS — J45909 Unspecified asthma, uncomplicated: Secondary | ICD-10-CM | POA: Insufficient documentation

## 2018-07-20 DIAGNOSIS — M1990 Unspecified osteoarthritis, unspecified site: Secondary | ICD-10-CM | POA: Insufficient documentation

## 2018-07-20 DIAGNOSIS — K7460 Unspecified cirrhosis of liver: Secondary | ICD-10-CM | POA: Insufficient documentation

## 2018-07-20 HISTORY — PX: VIDEO BRONCHOSCOPY: SHX5072

## 2018-07-20 LAB — BODY FLUID CELL COUNT WITH DIFFERENTIAL
EOS FL: 2 %
Lymphs, Fluid: 28 %
Monocyte-Macrophage-Serous Fluid: 16 % — ABNORMAL LOW (ref 50–90)
Neutrophil Count, Fluid: 54 % — ABNORMAL HIGH (ref 0–25)
Total Nucleated Cell Count, Fluid: 128 cu mm (ref 0–1000)

## 2018-07-20 LAB — GLUCOSE, CAPILLARY: Glucose-Capillary: 122 mg/dL — ABNORMAL HIGH (ref 70–99)

## 2018-07-20 SURGERY — VIDEO BRONCHOSCOPY WITHOUT FLUORO
Anesthesia: Moderate Sedation | Laterality: Bilateral

## 2018-07-20 MED ORDER — LIDOCAINE HCL 1 % IJ SOLN
INTRAMUSCULAR | Status: DC | PRN
  Administered 2018-07-20: 6 mL via RESPIRATORY_TRACT

## 2018-07-20 MED ORDER — LIDOCAINE HCL 2 % EX GEL: 1.0000 "application " | Freq: Once | CUTANEOUS | Status: DC

## 2018-07-20 MED ORDER — FENTANYL CITRATE (PF) 100 MCG/2ML IJ SOLN
INTRAMUSCULAR | Status: AC
  Filled 2018-07-20: qty 4

## 2018-07-20 MED ORDER — LIDOCAINE HCL URETHRAL/MUCOSAL 2 % EX GEL
CUTANEOUS | Status: DC | PRN
  Administered 2018-07-20: 1

## 2018-07-20 MED ORDER — BUTAMBEN-TETRACAINE-BENZOCAINE 2-2-14 % EX AERO: 1.0000 | INHALATION_SPRAY | Freq: Once | CUTANEOUS | Status: DC

## 2018-07-20 MED ORDER — PHENYLEPHRINE HCL 0.25 % NA SOLN
NASAL | Status: DC | PRN
  Administered 2018-07-20: 2 via NASAL

## 2018-07-20 MED ORDER — PHENYLEPHRINE HCL 0.25 % NA SOLN: 1.0000 | Freq: Four times a day (QID) | NASAL | Status: DC | PRN

## 2018-07-20 MED ORDER — LIDOCAINE HCL URETHRAL/MUCOSAL 2 % EX GEL: 1.0000 "application " | Freq: Once | CUTANEOUS | Status: DC

## 2018-07-20 MED ORDER — MIDAZOLAM HCL (PF) 10 MG/2ML IJ SOLN
INTRAMUSCULAR | Status: DC | PRN
  Administered 2018-07-20 (×3): 1 mg via INTRAVENOUS

## 2018-07-20 MED ORDER — SODIUM CHLORIDE 0.9 % IV SOLN
INTRAVENOUS | Status: DC
  Administered 2018-07-20: 10:00:00 via INTRAVENOUS

## 2018-07-20 MED ORDER — FENTANYL CITRATE (PF) 100 MCG/2ML IJ SOLN
INTRAMUSCULAR | Status: DC | PRN
  Administered 2018-07-20 (×2): 25 ug via INTRAVENOUS

## 2018-07-20 MED ORDER — MIDAZOLAM HCL (PF) 5 MG/ML IJ SOLN
INTRAMUSCULAR | Status: AC
  Filled 2018-07-20: qty 2

## 2018-07-20 NOTE — Discharge Instructions (Signed)
Flexible Bronchoscopy, Care After This sheet gives you information about how to care for yourself after your test. Your doctor may also give you more specific instructions. If you have problems or questions, contact your doctor. Follow these instructions at home: Eating and drinking  Do not eat or drink anything (not even water) for 2 hours after your test, or until your numbing medicine (local anesthetic) wears off.  When your numbness is gone and your cough and gag reflexes have come back, you may: ? Eat only soft foods. ? Slowly drink liquids.  The day after the test, go back to your normal diet. Driving  Do not drive for 24 hours if you were given a medicine to help you relax (sedative).  Do not drive or use heavy machinery while taking prescription pain medicine. General instructions   Take over-the-counter and prescription medicines only as told by your doctor.  Return to your normal activities as told. Ask what activities are safe for you.  Do not use any products that have nicotine or tobacco in them. This includes cigarettes and e-cigarettes. If you need help quitting, ask your doctor.  Keep all follow-up visits as told by your doctor. This is important. It is very important if you had a tissue sample (biopsy) taken. Get help right away if:  You have shortness of breath that gets worse.  You get light-headed.  You feel like you are going to pass out (faint).  You have chest pain.  You cough up: ? More than a little blood. ? More blood than before. Summary  Do not eat or drink anything (not even water) for 2 hours after your test, or until your numbing medicine wears off.  Do not use cigarettes. Do not use e-cigarettes.  Get help right away if you have chest pain. This information is not intended to replace advice given to you by your health care provider. Make sure you discuss any questions you have with your health care provider. Document Released: 02/22/2009  Document Revised: 05/15/2016 Document Reviewed: 05/15/2016 Elsevier Interactive Patient Education  2019 [place].  Nothing to eat or drink until   1:00  pm today     07/20/2018 Any questions or concerns please call the office at [place]  Date Time Provider [place]  07/21/2018  2:30 PM [facility] FINANCIAL COUNSELOR [facility] [place]  08/11/2018 11:30 AM Brand Males, MD [facility] None

## 2018-07-20 NOTE — Op Note (Signed)
Name:  Holly Mueller MRN:  [id number] DOB:  1966/03/25  PROCEDURE NOTE  Procedure(s): Flexible bronchoscopy [id number]) Bronchial alveolar lavage [phone] of the left lower lobe   Indications:  ILD  Consent:  Procedure, benefits, risks and alternatives discussed.  Questions answered.  Consent obtained.  Anesthesia:  Moderate Sedation  Location: [place] bronch suite  Procedure summary:  Appropriate equipment was assembled.  The patient was brought to the procedure suite room and identified as Holly Mueller with Sep 15, 1965  Safety timeout was performed. The patient was placed supine on the  table, airway and moderate sedation administered by this operator  After the appropriate level of moderation was assured, flexible video bronchoscope was lubricated and inserted through the endotracheal tube.  Total of 16 mL of 1% Lidocaine were administered through the bronchoscope to augment moderate sedation  Airway examination was NOT performed bilaterally to subsegmental level. Only trachea, carina and Left main was examined. In this, .  Minimal clear secretions were noted, mucosa appeared normal and NOT endobronchial lesions were identified  Bronchial alveolar lavage of the YES was performed with 100 mL of normal saline  3 number of times for total volume of 100 mL. Total return of 25 mL of fluid that was pinkish due to mild epistaxis contamination, after which the bronchoscope was withdrawn.   Flexible video bronchoscope was used again to perform TRANSBRONHIAL biopsies of   -  NO   After hemostasis was assure, the bronchoscope was withdrawn.  The patient was recovered and then  transferred to recovery area  Post-procedure chest x-ray was NOT required and so not ordered.  Specimens sent: Bronchial alveolar lavage specimen of the LEFT LOWER LOBE for cell count  microbiology and cytology.  Complications:  No immediate complications were noted.  Hemodynamic parameters and oxygenation  remained stable throughout the procedure.  Estimated blood loss:  none  IMPRESSION 1. ILD with LLL BAL 2.  Clabe Seal classifer sent - NO  Followup - will call with results Future Appointments  Date Time Provider [place]  07/21/2018  2:30 PM [facility] FINANCIAL COUNSELOR [facility]  08/11/2018 11:30 AM Brand Males, MD [facility] None    SIGNATURE    Dr. Brand Males, M.D., F.C.C.P,  Pulmonary and Critical Care Medicine Staff Physician, [place] Director - Interstitial Lung Disease  Program  Pulmonary [place] at [place], [place], [address]  Pager: [phone], If no answer or between  15:00h - 7:00h: call [phone] Telephone: [phone]  11:14 AM 07/20/2018

## 2018-07-20 NOTE — Interval H&P Note (Signed)
Risks of pneumothorax, hemothorax, sedation/anesthesia complications such as cardiac or respiratory arrest or hypotension, stroke and bleeding all explained. Benefits of diagnosis but limitations of non-diagnosis also explained. Patient verbalized understanding and wished to proceed.   Indication: ILD NOS  No interval change in last 30 days  Exam: No change     SIGNATURE    Dr. Brand Males, M.D., F.C.C.P,  Pulmonary and Critical Care Medicine Staff Physician, [place] Director - Interstitial Lung Disease  Program  Pulmonary [place] at [place], [place], [address]  Pager: [phone], If no answer or between  15:00h - 7:00h: call [phone] Telephone: [phone]  10:47 AM 07/20/2018

## 2018-07-20 NOTE — Progress Notes (Signed)
Video Bronchoscopy done Interventional Bronchial washing done Procedure tolerated well

## 2018-07-21 ENCOUNTER — Ambulatory Visit: Payer: Medicaid Other

## 2018-07-21 ENCOUNTER — Telehealth: Payer: Self-pay

## 2018-07-21 ENCOUNTER — Other Ambulatory Visit: Payer: Self-pay

## 2018-07-21 ENCOUNTER — Encounter (HOSPITAL_COMMUNITY): Payer: Self-pay | Admitting: Internal Medicine

## 2018-07-21 LAB — PNEUMOCYSTIS JIROVECI SMEAR BY DFA: Pneumocystis jiroveci Ag: NEGATIVE

## 2018-07-21 LAB — ACID FAST SMEAR (AFB, MYCOBACTERIA): Acid Fast Smear: NEGATIVE

## 2018-07-21 NOTE — Telephone Encounter (Signed)
Patient called stating she needs a letter stating she can go back to work, patient states she had bronchoscopy yesterday. She would like to be notified once the letter has been mailed out to her.

## 2018-07-21 NOTE — Telephone Encounter (Signed)
MR please advise when patient can go back to work and if there are any limitations. Patient will pick up letter. Thank you.

## 2018-07-22 LAB — CULTURE, BAL-QUANTITATIVE

## 2018-07-22 LAB — CULTURE, BAL-QUANTITATIVE W GRAM STAIN: Culture: 800 — AB

## 2018-07-22 NOTE — Telephone Encounter (Signed)
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 She states she mainly works on saturdays but she does pick up days during the week here and there. She states she is up on her feet the entire time and will need a note to verify her being able to take sitting breaks. Her job is requiring a note to have on file. She states the notes need to states she can have sitting breaks. She is currently taking Augmentin for a knot on her jaw. She started it on March 6th and she will finish it on Monday.   MR please advise, patient is currently taking Augmentin that she started 03/06 and will finish Monday for a tooth infection. Would you like for patient to finish course or add cephalexin.

## 2018-07-22 NOTE — Telephone Encounter (Signed)
ATC Line busy

## 2018-07-22 NOTE — Telephone Encounter (Signed)
1. Work letter - confused. Did she just take off from work for 1-2 days because of bronch? Or is it "ok to work despite her lung and liver condition". Also, what work does she do? I thought she was unemployed. And who is her employer? How many hours does she do?   2. Regarding bronch result -> growibng h flu (not virus but a bacteria with similar name) -> plan cephalexin 525m tid x 7 days  Thanks    SIGNATURE    Dr. MBrand Males M.D., F.C.C.P,  Pulmonary and Critical Care Medicine Staff Physician, [place]Director - Interstitial Lung Disease  Program  [place] [address] Pager: [phone] If no answer or between  15:00h - 7:00h: call [phone] Telephone: [phone]  9:29 AM 07/22/2018      Allergies  Allergen Reactions  . Aspirin Nausea Only    Upset stomach          Recent Results (from the past 720 hour(s))  Culture, bal-quantitative     Status: Abnormal (Preliminary result)   Collection Time: 07/20/18 10:55 AM  Result Value Ref Range Status   Specimen Description   Final    BRONCHIAL ALVEOLAR LAVAGE Performed at [facility] [address], [place] [address]   Special Requests   Final    Immunocompromised Performed at [facility] [address] [place]   Gram Stain   Final    FEW WBC PRESENT,BOTH PMN AND MONONUCLEAR NO ORGANISMS SEEN    Culture (A)  Final    800 COLONIES/mL HAEMOPHILUS INFLUENZAE BETA LACTAMASE NEGATIVE Performed at [facility] [address], [place] [address]   Report Status PENDING  Incomplete  Pneumocystis smear by DFA     Status: None   Collection Time: 07/20/18 10:55 AM  Result Value Ref Range Status   Specimen Source-PJSRC BRONCHIAL ALVEOLAR LAVAGE  Final   Pneumocystis jiroveci Ag NEGATIVE  Final    Comment: Performed at [facility] Performed at [facility] [address], [place]  Acid Fast Smear (AFB)     Status: None   Collection Time: 07/20/18 10:55 AM  Result Value Ref Range Status   AFB Specimen Processing Concentration  Final   Acid Fast Smear Negative  Final    Comment: (NOTE) Performed At: [facility]6789NRush FarmerMD PFY:[id number]   Source (AFB) BRONCHIAL ALVEOLAR LAVAGE  Final    Comment: Performed at [facility] [address] [place] [address] Culture, fungus without smear     Status: None (Preliminary result)   Collection Time: 07/20/18 10:55 AM  Result Value Ref Range Status   Specimen Description   Final    BRONCHIAL ALVEOLAR LAVAGE Performed at W[address], [place] [address]   Special Requests   Final    Immunocompromised Performed at [facility] [address], [place] [address]   Culture   Final    NO FUNGUS ISOLATED AFTER 1 DAY Performed at [facility] [address], [place] [address]   Report Status PENDING  Incomplete

## 2018-07-25 NOTE — Telephone Encounter (Signed)
Pt is calling back [phone]

## 2018-07-25 NOTE — Telephone Encounter (Signed)
Patient called back to check on status of letter and abx. She stated that for this week, she will be working on Wednesday and Friday for a total of 11hrs. She stated that it does not matter how long she works, she will need to take sitting breaks.   Will route to MR so he is aware.

## 2018-07-26 ENCOUNTER — Other Ambulatory Visit: Payer: Self-pay

## 2018-07-26 ENCOUNTER — Ambulatory Visit (INDEPENDENT_AMBULATORY_CARE_PROVIDER_SITE_OTHER): Payer: Self-pay | Admitting: Family Medicine

## 2018-07-26 ENCOUNTER — Encounter: Payer: Self-pay | Admitting: Family Medicine

## 2018-07-26 VITALS — BP 112/68 | HR 71 | Temp 98.4°F | Ht 66.0 in | Wt 242.4 lb

## 2018-07-26 DIAGNOSIS — K219 Gastro-esophageal reflux disease without esophagitis: Secondary | ICD-10-CM

## 2018-07-26 DIAGNOSIS — J849 Interstitial pulmonary disease, unspecified: Secondary | ICD-10-CM

## 2018-07-26 DIAGNOSIS — E119 Type 2 diabetes mellitus without complications: Secondary | ICD-10-CM

## 2018-07-26 DIAGNOSIS — E039 Hypothyroidism, unspecified: Secondary | ICD-10-CM

## 2018-07-26 LAB — POCT GLYCOSYLATED HEMOGLOBIN (HGB A1C): HbA1c, POC (controlled diabetic range): 6.2 % (ref 0.0–7.0)

## 2018-07-26 LAB — MTB NAA WITHOUT AFB CULTURE: M Tuberculosis, Naa: NEGATIVE

## 2018-07-26 MED ORDER — METFORMIN HCL 500 MG PO TABS: 500.0000 mg | ORAL_TABLET | Freq: Two times a day (BID) | ORAL | 2 refills | Status: DC

## 2018-07-26 MED ORDER — LEVOTHYROXINE SODIUM 125 MCG PO TABS: 250.0000 ug | ORAL_TABLET | Freq: Every day | ORAL | 0 refills | Status: DC

## 2018-07-26 MED ORDER — PANTOPRAZOLE SODIUM 40 MG PO TBEC: 40.0000 mg | DELAYED_RELEASE_TABLET | Freq: Two times a day (BID) | ORAL | 3 refills | Status: DC

## 2018-07-26 MED FILL — PANTOPRAZOLE SOD DR 40 MG T: 40 | 30 days supply | Qty: 60 | Fill #0

## 2018-07-26 MED FILL — metFORMIN HCL 500 MG TABS: 500 | 30 days supply | Qty: 60 | Fill #0

## 2018-07-26 MED FILL — LEVOTHYROXINE 125 MCG TAB: 125 | 30 days supply | Qty: 60 | Fill #0

## 2018-07-26 NOTE — Patient Instructions (Addendum)
Your medications in order of priority when you are short on resources:   1. Thyroid medicine.  2. Inhalers 3. Acid medicine.  4. Metformin, diabetes medicine.   The rest likely isn't as important right now.    Stay home as much as you can, you are high risk due to your lung disease.

## 2018-07-26 NOTE — Assessment & Plan Note (Signed)
a1c controlled today, continue metformin. Asked patient not to prioritize spending money on test strips right now as we don't need her to check daily CBGs.

## 2018-07-26 NOTE — Telephone Encounter (Signed)
1. FOr next 2-3 months I do not advise her to work in that setting due to covid19  2. Regarding letter  Dear Employer of Holly Mueller   She can return to work but needs frequent sitting breaks every 30-60 minutes atleast and for 5 minutes each time. 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 Guidelines and ensure adequate fomite decontamination procedures   Sincerely yours  Dr Chase Caller

## 2018-07-26 NOTE — Assessment & Plan Note (Signed)
Recheck TSH today.

## 2018-07-26 NOTE — Assessment & Plan Note (Signed)
Refilled pantoprazole today.

## 2018-07-26 NOTE — Progress Notes (Signed)
CC:  F/u parotiditis   HPI  She is getting her orange card today.   Her work is shut down for 2 weeks. She knows that she should stay home.   She is here for checkup on her jaw. She finished her antibiotics for her mouth. She is able to eat and drink normally now. No fever.   Ran out of synthroid a few days ago. Needs a refill.   Hasn't gotten ursodial yet due to cost.   Had her BAL last week.   ROS: Denies CP, SOB, abdominal pain, dysuria, changes in BMs.   CC, SH/smoking status, and VS noted  Objective: BP 112/68   Pulse 71   Temp 98.4 F (36.9 C) (Oral)   Ht _0  (1.676 m)   Wt 242 lb 6.4 oz (110 kg)   SpO2 92%   BMI 39.12 kg/m  Gen: NAD, alert, cooperative, and pleasant. HEENT: NCAT, EOMI, PERRL. No parotid swelling or tenderness.  CV: RRR, no murmur Resp: CTAB, no wheezes, non-labored Ext: No edema, warm Neuro: Alert and oriented, Speech clear, No gross deficits  Assessment and plan:  Parotiditis resolved.   Coordination of care: We prioritized her medications according to necessity and cost today given that she has limited resources and is waiting for orange card.  See AVS.  ILD (interstitial lung disease) ([place]) Counseled patient that she is in a high risk category given her lung disease and she should use social distancing and stay home as much as possible  GERD (gastroesophageal reflux disease)  Refilled pantoprazole today.  Type 2 diabetes mellitus without complication, without long-term current use of insulin (HCC) a1c controlled today, continue metformin. Asked patient not to prioritize spending money on test strips right now as we don't need her to check daily CBGs.   Hypothyroidism Recheck TSH today.    Orders Placed This Encounter  Procedures  . TSH  . Basic metabolic panel  . POCT glycosylated hemoglobin (Hb A1C)    Meds ordered this encounter  Medications  . DISCONTD: levothyroxine (SYNTHROID, LEVOTHROID) 125 MCG tablet    Sig:  Take 2 tablets (250 mcg total) by mouth daily.    Dispense:  60 tablet    Refill:  0  . DISCONTD: pantoprazole (PROTONIX) 40 MG tablet    Sig: Take 1 tablet (40 mg total) by mouth 2 (two) times daily before a meal.    Dispense:  60 tablet    Refill:  3  . DISCONTD: metFORMIN (GLUCOPHAGE) 500 MG tablet    Sig: Take 1 tablet (500 mg total) by mouth 2 (two) times daily with a meal.    Dispense:  180 tablet    Refill:  2  . levothyroxine (SYNTHROID, LEVOTHROID) 125 MCG tablet    Sig: Take 2 tablets (250 mcg total) by mouth daily.    Dispense:  60 tablet    Refill:  0    Indigent fund [facility]  . metFORMIN (GLUCOPHAGE) 500 MG tablet    Sig: Take 1 tablet (500 mg total) by mouth 2 (two) times daily with a meal.    Dispense:  180 tablet    Refill:  2    [facility] fund  . pantoprazole (PROTONIX) 40 MG tablet    Sig: Take 1 tablet (40 mg total) by mouth 2 (two) times daily before a meal.    Dispense:  60 tablet    Refill:  3    Indigent fund [place],  MD, PGY3 07/26/2018 4:21 PM

## 2018-07-26 NOTE — Assessment & Plan Note (Signed)
Counseled patient that she is in a high risk category given her lung disease and she should use social distancing and stay home as much as possible

## 2018-07-27 ENCOUNTER — Encounter: Payer: Self-pay | Admitting: General Surgery

## 2018-07-27 ENCOUNTER — Telehealth: Payer: Self-pay | Admitting: Internal Medicine

## 2018-07-27 LAB — BASIC METABOLIC PANEL
BUN/Creatinine Ratio: 13 (ref 9–23)
BUN: 8 mg/dL (ref 6–24)
CO2: 22 mmol/L (ref 20–29)
Calcium: 8.6 mg/dL — ABNORMAL LOW (ref 8.7–10.2)
Chloride: 107 mmol/L — ABNORMAL HIGH (ref 96–106)
Creatinine, Ser: 0.62 mg/dL (ref 0.57–1.00)
GFR calc Af Amer: 119 mL/min/{1.73_m2} (ref >59)
GFR calc non Af Amer: 103 mL/min/{1.73_m2} (ref >59)
Glucose: 176 mg/dL — ABNORMAL HIGH (ref 65–99)
Potassium: 3.7 mmol/L (ref 3.5–5.2)
Sodium: 138 mmol/L (ref 134–144)

## 2018-07-27 LAB — TSH: TSH: 24.22 u[IU]/mL — ABNORMAL HIGH (ref 0.450–4.500)

## 2018-07-27 NOTE — Telephone Encounter (Signed)
Left message for patient to call back.

## 2018-07-27 NOTE — Telephone Encounter (Signed)
Called the patient back and she confirmed the letter can be addressed to:   [place], [place] [address] Attn: Liam Rogers  Patient said the letter can be mailed to her. I advised her that once she gets the letter to make sure she keeps a copy of it for herself before giving the original to the employer.   Patient stated that she talked to her other doctor who told her to stay out of work if at all possible until this virus issue passes.  Patient agreed. Nothing further needed at this time.  Letter printed for Dr. Chase Caller to sign.

## 2018-08-11 ENCOUNTER — Ambulatory Visit (INDEPENDENT_AMBULATORY_CARE_PROVIDER_SITE_OTHER): Payer: Self-pay | Admitting: Nurse Practitioner

## 2018-08-11 ENCOUNTER — Other Ambulatory Visit: Payer: Self-pay

## 2018-08-11 ENCOUNTER — Ambulatory Visit: Payer: Medicaid Other | Admitting: Internal Medicine

## 2018-08-11 ENCOUNTER — Telehealth: Payer: Self-pay | Admitting: Internal Medicine

## 2018-08-11 DIAGNOSIS — J849 Interstitial pulmonary disease, unspecified: Secondary | ICD-10-CM

## 2018-08-11 LAB — CULTURE, FUNGUS WITHOUT SMEAR

## 2018-08-11 NOTE — Telephone Encounter (Signed)
Patient called back for televisit. Transferred over to [place].

## 2018-08-11 NOTE — Progress Notes (Signed)
Virtual Visit via Telephone Note  I connected with Holly Mueller on 08/12/18 at  3:30 PM EDT by telephone and verified that I am speaking with the correct person using two identifiers.   I discussed the limitations, risks, security and privacy concerns of performing an evaluation and management service by telephone and the availability of in person appointments. I also discussed with the patient that there may be a patient responsible charge related to this service. The patient expressed understanding and agreed to proceed.   History of Present Illness: Holly Mueller is a 53 year old with past history of asthma who is followed by Dr. Chase Caller.   Presents for a follow-up telephone visit today.  She states that this is been a stable interval for her.  She was last seen by Dr. Chase Caller on 07/12/2018.  She is currently maintained on Asmanex and albuterol. She needs to use her albuterol around 2-3 times per week. She denies any nighttime awakenings. She also has significant issues with sinusitis, postnasal drip with constant clearing of the throat. She has heartburn and is on Prilosec. Denies f/c/s, n/v/d, hemoptysis, PND, leg swelling.  Observations/Objective:  CXR 07/05/18 - Low lung volumes.  Chronic interstitial lung disease.  PFT Results Latest Ref Rng & Units 07/12/2018 11/13/2016  FVC-Pre L 1.87 1.80  FVC-Predicted Pre % 50 48  FVC-Post L - 1.84  FVC-Predicted Post % - 49  Pre FEV1/FVC % % 84 85  Post FEV1/FCV % % - 82  FEV1-Pre L 1.58 1.54  FEV1-Predicted Pre % 53 52  FEV1-Post L - 1.52  DLCO UNC% % 66 -  DLCO COR %Predicted % 128 -     Assessment and Plan: Patient states that this is been a stable interval for her.  She was last seen by Dr. Chase Caller on 07/12/2018.  She is currently maintained on Asmanex and albuterol. She needs to use her albuterol around 2-3 times per week. She denies any nighttime awakenings. She also has significant issues with sinusitis, postnasal drip with constant  clearing of the throat. She has heartburn and is on Protonix.  Patient Instructions  Continue Proventil as needed Continue asmanex Continue protonix Continue Zyrtec and flonase   Follow Up Instructions: Follow up with Dr. Chase Caller in 4 months or sooner if needed   I discussed the assessment and treatment plan with the patient. The patient was provided an opportunity to ask questions and all were answered. The patient agreed with the plan and demonstrated an understanding of the instructions.   The patient was advised to call back or seek an in-person evaluation if the symptoms worsen or if the condition fails to improve as anticipated.  I provided 23 minutes of non-face-to-face time during this encounter.   Fenton Foy, NP

## 2018-08-12 ENCOUNTER — Encounter: Payer: Self-pay | Admitting: Nurse Practitioner

## 2018-08-12 NOTE — Patient Instructions (Addendum)
Continue Proventil as needed Continue asmanex Continue protonix Continue Zyrtec and flonase  Follow up with Dr. Chase Caller in 4 months or sooner if needed

## 2018-08-12 NOTE — Assessment & Plan Note (Signed)
Patient states that this is been a stable interval for her.  She was last seen by Dr. Chase Caller on 07/12/2018.  She is currently maintained on Asmanex and albuterol. She needs to use her albuterol around 2-3 times per week. She denies any nighttime awakenings. She also has significant issues with sinusitis, postnasal drip with constant clearing of the throat. She has heartburn and is on Protonix.  Patient Instructions  Continue Proventil as needed Continue asmanex Continue protonix Continue Zyrtec and flonase  Follow up with Dr. Chase Caller in 4 months or sooner if needed

## 2018-08-22 ENCOUNTER — Telehealth: Payer: Self-pay | Admitting: Internal Medicine

## 2018-08-22 NOTE — Telephone Encounter (Signed)
I called pt but the phone would ring and then go busy. Will try again later.

## 2018-08-26 NOTE — Telephone Encounter (Signed)
Attempted to call pt x2 but the line would ring and then would immediately change to a busy tone. Will try again later.

## 2018-08-27 ENCOUNTER — Encounter (HOSPITAL_COMMUNITY): Payer: Self-pay | Admitting: Emergency Medicine

## 2018-08-27 ENCOUNTER — Emergency Department (HOSPITAL_COMMUNITY): Payer: Medicaid Other

## 2018-08-27 ENCOUNTER — Inpatient Hospital Stay (HOSPITAL_COMMUNITY)
Admission: EM | Admit: 2018-08-27 | Discharge: 2018-09-02 | DRG: 202 | Disposition: A | Discharge status: Home / Self Care | Payer: Medicaid Other | Attending: Internal Medicine | Admitting: Internal Medicine

## 2018-08-27 ENCOUNTER — Other Ambulatory Visit: Payer: Self-pay

## 2018-08-27 DIAGNOSIS — J849 Interstitial pulmonary disease, unspecified: Secondary | ICD-10-CM | POA: Diagnosis present

## 2018-08-27 DIAGNOSIS — E669 Obesity, unspecified: Secondary | ICD-10-CM | POA: Diagnosis present

## 2018-08-27 DIAGNOSIS — R042 Hemoptysis: Secondary | ICD-10-CM | POA: Diagnosis present

## 2018-08-27 DIAGNOSIS — K5900 Constipation, unspecified: Secondary | ICD-10-CM | POA: Diagnosis present

## 2018-08-27 DIAGNOSIS — K219 Gastro-esophageal reflux disease without esophagitis: Secondary | ICD-10-CM | POA: Diagnosis present

## 2018-08-27 DIAGNOSIS — R109 Unspecified abdominal pain: Secondary | ICD-10-CM

## 2018-08-27 DIAGNOSIS — J209 Acute bronchitis, unspecified: Principal | ICD-10-CM | POA: Diagnosis present

## 2018-08-27 DIAGNOSIS — Z20828 Contact with and (suspected) exposure to other viral communicable diseases: Secondary | ICD-10-CM | POA: Diagnosis present

## 2018-08-27 DIAGNOSIS — D72819 Decreased white blood cell count, unspecified: Secondary | ICD-10-CM | POA: Diagnosis present

## 2018-08-27 DIAGNOSIS — D61818 Other pancytopenia: Secondary | ICD-10-CM | POA: Diagnosis present

## 2018-08-27 DIAGNOSIS — Z7984 Long term (current) use of oral hypoglycemic drugs: Secondary | ICD-10-CM

## 2018-08-27 DIAGNOSIS — E119 Type 2 diabetes mellitus without complications: Secondary | ICD-10-CM

## 2018-08-27 DIAGNOSIS — K743 Primary biliary cirrhosis: Secondary | ICD-10-CM | POA: Diagnosis present

## 2018-08-27 DIAGNOSIS — T380X5A Adverse effect of glucocorticoids and synthetic analogues, initial encounter: Secondary | ICD-10-CM | POA: Diagnosis present

## 2018-08-27 DIAGNOSIS — K7460 Unspecified cirrhosis of liver: Secondary | ICD-10-CM | POA: Diagnosis present

## 2018-08-27 DIAGNOSIS — Z7951 Long term (current) use of inhaled steroids: Secondary | ICD-10-CM

## 2018-08-27 DIAGNOSIS — Z79899 Other long term (current) drug therapy: Secondary | ICD-10-CM

## 2018-08-27 DIAGNOSIS — R739 Hyperglycemia, unspecified: Secondary | ICD-10-CM

## 2018-08-27 DIAGNOSIS — Z87891 Personal history of nicotine dependence: Secondary | ICD-10-CM

## 2018-08-27 DIAGNOSIS — E039 Hypothyroidism, unspecified: Secondary | ICD-10-CM | POA: Diagnosis present

## 2018-08-27 DIAGNOSIS — Z833 Family history of diabetes mellitus: Secondary | ICD-10-CM

## 2018-08-27 DIAGNOSIS — Z79890 Hormone replacement therapy (postmenopausal): Secondary | ICD-10-CM

## 2018-08-27 DIAGNOSIS — R188 Other ascites: Secondary | ICD-10-CM | POA: Diagnosis present

## 2018-08-27 DIAGNOSIS — E1165 Type 2 diabetes mellitus with hyperglycemia: Secondary | ICD-10-CM | POA: Diagnosis present

## 2018-08-27 MED ORDER — ONDANSETRON HCL 4 MG/2ML IJ SOLN
INTRAMUSCULAR | Status: AC
  Filled 2018-08-27: qty 2

## 2018-08-27 NOTE — ED Notes (Signed)
Bed: EH20 Expected date:  Expected time:  Means of arrival:  Comments: 58F Hemoptysis

## 2018-08-27 NOTE — ED Provider Notes (Signed)
[facility] DEPT Provider Note   CSN: [id number] Arrival date & time: 08/27/18  2329    History   Chief Complaint Chief Complaint  Patient presents with  . Hemoptysis    HPI Holly Mueller is a 53 y.o. female.     The history is provided by the patient.  Cough  Cough characteristics:  Productive Sputum characteristics:  Bloody Severity:  Moderate Onset quality:  Gradual Timing:  Intermittent Progression:  Worsening Chronicity:  New Relieved by:  Nothing Worsened by:  Nothing Associated symptoms: shortness of breath   Associated symptoms: no chest pain and no fever   Patient with history of interstitial lung disease, cirrhosis, diabetes presents with cough.  She reports for the past several days she has had increasing cough and shortness of breath.  She has also been coughing up some   blood.  Tonight she coughed up a large amount of blood.  She is also been dry heaving and vomiting. No chest pain. Does not take anticoagulants. She has not noted any blood in her stool She is a non-smoker.  She is not on home oxygen Past Medical History:  Diagnosis Date  . Arthritis    bursitis left hip flares-not an issue  . Diabetes mellitus without complication ([place])   . Edentulous    10-19-13 at present  . Epilepsy ([place]) in 1972   No seizures since 1972. Previously treated with phenobarbital.   . Fever 06/2017  . GERD (gastroesophageal reflux disease) 2008  . Headache(784.0)    hx of migraines   . Hypothyroidism 2008  . Lower esophageal ring 08/18/2013  . Seasonal allergies 2003  . Shortness of breath     Patient Active Problem List   Diagnosis Date Noted  . Insurance coverage problems 04/04/2018  . ILD (interstitial lung disease) ([place]) 03/23/2018  . Food insecurity 10/15/2017  . Hyperglycemia 10/06/2017  . Ectatic aorta ([place]) 09/11/2017  . Left leg pain   . Type 2 diabetes mellitus without complication, without long-term current use of  insulin ([place])   . Cirrhosis of liver without ascites ([place]) 11/17/2016  . Thrombocytopenia ([place]) 11/17/2016  . Leucopenia 11/17/2016  . Bicytopenia 10/24/2016  . Epistaxis 10/23/2016  . GERD (gastroesophageal reflux disease) 10/23/2016  . Cough 12/09/2015  . Chronic pain of left thumb 10/09/2015  . Precordial chest pain 08/03/2015  . Bursitis of left hip 09/14/2013  . Lower esophageal ring 08/18/2013  . DOE (dyspnea on exertion) 08/17/2013  . Ganglion cyst of wrist 08/24/2012  . Right hip pain 08/23/2012  . Umbilical hernia 85/88/5027  . Healthcare maintenance 02/14/2012  . Osteoarthritis 02/12/2012  . Allergic rhinitis 08/26/2011  . Obesity 07/08/2010  . SEIZURES, HX OF 12/12/2009  . DENTAL CARIES 12/18/2008  . DEPRESSION 07/26/2008  . MIGRAINE HEADACHE 11/15/2007  . Hypothyroidism 10/12/2006  . Restrictive lung disease 07/30/2006    Past Surgical History:  Procedure Laterality Date  . BALLOON DILATION N/A 10/24/2013   Procedure: BALLOON DILATION;  Surgeon: Gatha Mayer, MD;  Location: [facility] ENDOSCOPY;  Service: Endoscopy;  Laterality: N/A;  . CESAREAN SECTION     x2  . DENTAL SURGERY     multiple extractions 3'15  . ESOPHAGOGASTRODUODENOSCOPY N/A 10/24/2013   Procedure: ESOPHAGOGASTRODUODENOSCOPY (EGD);  Surgeon: Gatha Mayer, MD;  Location: [facility];  Service: Endoscopy;  Laterality: N/A;  . LIVER BIOPSY  06/30/2012   Procedure: LIVER BIOPSY;  Surgeon: Shann Medal, MD;  Location: [facility] ORS;  Service: General;;  .  NOVASURE ABLATION    . SHOULDER ARTHROSCOPY Right 2011  . UMBILICAL HERNIA REPAIR N/A 06/30/2012   Procedure: remove umbilicus;  Surgeon: Shann Medal, MD;  Location: [facility] ORS;  Service: General;  Laterality: N/A;  . VENTRAL HERNIA REPAIR N/A 06/30/2012   Procedure: LAPAROSCOPIC VENTRAL HERNIA;  Surgeon: Shann Medal, MD;  Location: [facility] ORS;  Service: General;  Laterality: N/A;  With Mesh  . VIDEO BRONCHOSCOPY Bilateral 07/20/2018   Procedure: VIDEO  BRONCHOSCOPY WITHOUT FLUORO;  Surgeon: Brand Males, MD;  Location: [facility] ENDOSCOPY;  Service: Cardiopulmonary;  Laterality: Bilateral;  . WISDOM TOOTH EXTRACTION       OB History   No obstetric history on file.      Home Medications    Prior to Admission medications   Medication Sig Start Date End Date Taking? Authorizing Provider  ACCU-CHEK SOFTCLIX LANCETS lancets USE UP TO 4 TIMES DAILY AS DIRECTED 04/25/18   Rory Percy, DO  acetaminophen (TYLENOL) 500 MG tablet Take 1,000 mg by mouth every 8 (eight) hours as needed (pain).    [provider]  albuterol (PROAIR HFA) 108 (90 Base) MCG/ACT inhaler INHALE 2 PUFFS INTO THE LUNGS EVERY 6 HOURS AS NEEDED FOR SHORTNESS OF BREATH 04/25/18   Rory Percy, DO  albuterol (PROVENTIL) (2.5 MG/3ML) 0.083% nebulizer solution Take 3 mLs (2.5 mg total) by nebulization every 6 (six) hours as needed for wheezing or shortness of breath. 04/25/18   Rory Percy, DO  blood glucose meter kit and supplies KIT Dispense based on patient and insurance preference. Use up to four times daily as directed. (FOR ICD-9 250.00, 250.01). 06/19/17   Rory Percy, DO  cetirizine (ZYRTEC) 10 MG tablet Take 1 tablet (10 mg total) by mouth daily. 01/27/17   [name], MD  fluticasone (FLONASE) 50 MCG/ACT nasal spray Place 2 sprays into both nostrils daily.    [provider]  glucose blood (ACCU-CHEK AVIVA PLUS) test strip USE UP TO 4 TIMES DAILY AS DIRECTED 04/25/18   Rory Percy, DO  ibuprofen (ADVIL,MOTRIN) 200 MG tablet Take 600 mg by mouth every 6 (six) hours as needed for moderate pain.    [provider]  levothyroxine (SYNTHROID, LEVOTHROID) 125 MCG tablet Take 2 tablets (250 mcg total) by mouth daily. 07/26/18   [name], MD  metFORMIN (GLUCOPHAGE) 500 MG tablet Take 1 tablet (500 mg total) by mouth 2 (two) times daily with a meal. 07/26/18   [name], MD  Mometasone Furoate [facility] HFA) 200  MCG/ACT AERO Inhale 2 puffs into the lungs 2 (two) times daily. 05/25/18   Brand Males, MD  pantoprazole (PROTONIX) 40 MG tablet Take 1 tablet (40 mg total) by mouth 2 (two) times daily before a meal. 07/26/18   [name], MD  ursodiol (ACTIGALL) 500 MG tablet Take 1 tablet (500 mg total) by mouth 2 (two) times daily. 03/03/18   Zenia Resides, MD    Family History Family History  Problem Relation Age of Onset  . Hypertension Mother   . Diabetes Mother   . Lung cancer Father   . Stroke Father   . Diabetes Sister   . Hypertension Sister     Social History Social History   Tobacco Use  . Smoking status: Former Smoker    Packs/day: 0.25    Types: Cigarettes    Last attempt to quit: 1997    Years since quitting: 23.3  . Smokeless tobacco: Never Used  . Tobacco comment: social  Substance Use Topics  .  Alcohol use: Yes    Comment: occ  . Drug use: No     Allergies   Aspirin   Review of Systems Review of Systems  Constitutional: Negative for fever.  Respiratory: Positive for cough and shortness of breath.   Cardiovascular: Negative for chest pain.  Gastrointestinal: Positive for vomiting. Negative for blood in stool.  All other systems reviewed and are negative.    Physical Exam Updated Vital Signs BP 93/75 (BP Location: Right Arm)   Pulse 93   Temp 98 F (36.7 C) (Oral)   Resp 18   Ht 1.676 m (_0 )   Wt 110.2 kg   LMP  (LMP Unknown)   SpO2 93%   BMI 39.22 kg/m   Physical Exam CONSTITUTIONAL: Ill-appearing HEAD: Normocephalic/atraumatic EYES: EOMI/PERRL ENMT: Mucous membranes moist, dried blood around mouth, no active bleeding in oropharynx.  No blood in nose. NECK: supple no meningeal signs SPINE/BACK:entire spine nontender CV: S1/S2 noted, no murmurs/rubs/gallops noted LUNGS: Coarse breath sounds bilaterally ABDOMEN: soft, nontender, no rebound or guarding, bowel sounds noted throughout abdomen GU:no cva tenderness Rectal -no  blood or melena, chaperone present NEURO: Pt is awake/alert/appropriate, moves all extremitiesx4.  No facial droop.   EXTREMITIES: pulses normal/equal, full ROM SKIN: warm, color normal PSYCH: no abnormalities of mood noted, alert and oriented to situation   ED Treatments / Results  Labs (all labs ordered are listed, but only abnormal results are displayed) Labs Reviewed  CBC WITH DIFFERENTIAL/PLATELET - Abnormal; Notable for the following components:      Result Value   WBC 2.1 (*)    RBC 3.60 (*)    Hemoglobin 10.7 (*)    HCT 32.1 (*)    Platelets 76 (*)    Neutro Abs 1.3 (*)    Lymphs Abs 0.4 (*)    All other components within normal limits  PROTIME-INR - Abnormal; Notable for the following components:   Prothrombin Time 16.9 (*)    INR 1.4 (*)    All other components within normal limits  COMPREHENSIVE METABOLIC PANEL - Abnormal; Notable for the following components:   Sodium 134 (*)    CO2 19 (*)    Glucose, Bld 293 (*)    Calcium 7.9 (*)    Albumin 2.7 (*)    AST 62 (*)    Alkaline Phosphatase 168 (*)    Total Bilirubin 1.9 (*)    All other components within normal limits  D-DIMER, QUANTITATIVE (NOT AT [facility]) - Abnormal; Notable for the following components:   D-Dimer, Quant 0.89 (*)    All other components within normal limits  LACTATE DEHYDROGENASE - Abnormal; Notable for the following components:   LDH 201 (*)    All other components within normal limits  SARS CORONAVIRUS 2 (HOSPITAL ORDER, [facility])  PROCALCITONIN  FERRITIN  TRIGLYCERIDES  FIBRINOGEN  C-REACTIVE PROTEIN  POC OCCULT BLOOD, ED  TYPE AND SCREEN  ABO/RH    EKG EKG Interpretation  Date/Time:  Saturday August 27 2018 23:56:35 EDT Ventricular Rate:  97 PR Interval:    QRS Duration: 93 QT Interval:  371 QTC Calculation: 472 R Axis:   11 Text Interpretation:  Sinus rhythm Borderline T abnormalities, inferior leads No significant change since last tracing  Confirmed by Ripley Fraise ([id number]) on 08/28/2018 12:02:32 AM   Radiology Dg Chest Port 1 View  Result Date: 08/28/2018 CLINICAL DATA:  53 y/o  F; cough, shortness of breath. EXAM: PORTABLE CHEST 1 VIEW COMPARISON:  07/05/2018 chest radiograph. FINDINGS: Stable cardiac silhouette given projection and technique. Low lung volumes. Stable coarse reticular opacities compatible with interstitial lung disease. No new consolidation, effusion, or pneumothorax identified. No acute osseous abnormality is evident. IMPRESSION: Stable low lung volumes and findings of interstitial lung disease. No acute pulmonary process identified. Electronically Signed   By: Kristine Garbe M.D.   On: 08/28/2018 00:21    Procedures Procedures  CRITICAL CARE Performed by: Sharyon Cable Total critical care time: 33 minutes Critical care time was exclusive of separately billable procedures and treating other patients. Critical care was necessary to treat or prevent imminent or life-threatening deterioration. Critical care was time spent personally by me on the following activities: development of treatment plan with patient and/or surrogate as well as nursing, discussions with consultants, evaluation of patient's response to treatment, examination of patient, obtaining history from patient or surrogate, ordering and performing treatments and interventions, ordering and review of laboratory studies, ordering and review of radiographic studies, pulse oximetry and re-evaluation of patient's condition. Patient with hemoptysis required admission for close monitoring in the stepdown unit Medications Ordered in ED Medications  albuterol (VENTOLIN HFA) 108 (90 Base) MCG/ACT inhaler 2 puff (has no administration in time range)  AeroChamber Plus Flo-Vu Medium MISC 1 each (has no administration in time range)  sodium chloride 0.9 % bolus 1,000 mL (0 mLs Intravenous Stopped 08/28/18 0048)  ondansetron (ZOFRAN) injection 4  mg (4 mg Intravenous Given 08/28/18 0018)     Initial Impression / Assessment and Plan / ED Course  I have reviewed the triage vital signs and the nursing notes.  Pertinent labs & imaging results that were available during my care of the patient were reviewed by me and considered in my medical decision making (see chart for details).        12:02 AM PT Presents with cough and shortness of breath.  She has a history of ILD. Has a history of cirrhosis.  I suspect this is hemoptysis, but she is having vomiting which makes presentation confusing.  Stool does not reveal any signs of active GI bleed. Last EGD revealed she has eosinophilic esophagitis Patient will need to be admitted. 1:07 AM Patient has had some coughing, but no further hemoptysis.  Chest x-ray was reviewed and is negative Rest of her testing is pending at this time.  She would like to try albuterol MDI 2:31 AM Patient did have an episode of hemoptysis, but that is improved.  She is not hypoxic, no acute distress. Will need to be admitted and closely monitor.  She does have a mild coagulopathy as well as thrombocytopenia which likely contributed to her hemoptysis.  I discussed the case with on-call Dr. Blaine Hamper.  Patient be placed in the stepdown unit.  Holly Mueller was evaluated in Emergency Department on 08/28/2018 for the symptoms described in the history of present illness. She was evaluated in the context of the global COVID-19 pandemic, which necessitated consideration that the patient might be at risk for infection with the SARS-CoV-2 virus that causes COVID-19. Institutional protocols and algorithms that pertain to the evaluation of patients at risk for COVID-19 are in a state of rapid change based on information released by regulatory bodies including the CDC and federal and state organizations. These policies and algorithms were followed during the patient's care in the ED.    Final Clinical Impressions(s) / ED Diagnoses    Final diagnoses:  Hemoptysis  Hyperglycemia    ED Discharge Orders  None       Ripley Fraise, MD 08/28/18 [phone]

## 2018-08-27 NOTE — ED Triage Notes (Signed)
Patient arrived with EMS from home c/o SOB and cough for a week. Per patient she get nauseous and vomited blood tonight. Patient had 50-162m of bright red blood in the emesis bag. No reports of fever

## 2018-08-28 ENCOUNTER — Encounter (HOSPITAL_COMMUNITY): Payer: Self-pay

## 2018-08-28 ENCOUNTER — Inpatient Hospital Stay (HOSPITAL_COMMUNITY): Payer: Medicaid Other

## 2018-08-28 DIAGNOSIS — D72819 Decreased white blood cell count, unspecified: Secondary | ICD-10-CM | POA: Diagnosis present

## 2018-08-28 DIAGNOSIS — D61818 Other pancytopenia: Secondary | ICD-10-CM | POA: Diagnosis present

## 2018-08-28 DIAGNOSIS — E039 Hypothyroidism, unspecified: Secondary | ICD-10-CM | POA: Diagnosis present

## 2018-08-28 DIAGNOSIS — R042 Hemoptysis: Secondary | ICD-10-CM | POA: Diagnosis present

## 2018-08-28 DIAGNOSIS — J849 Interstitial pulmonary disease, unspecified: Secondary | ICD-10-CM

## 2018-08-28 DIAGNOSIS — K7460 Unspecified cirrhosis of liver: Secondary | ICD-10-CM

## 2018-08-28 DIAGNOSIS — E119 Type 2 diabetes mellitus without complications: Secondary | ICD-10-CM

## 2018-08-28 DIAGNOSIS — Z20828 Contact with and (suspected) exposure to other viral communicable diseases: Secondary | ICD-10-CM | POA: Diagnosis present

## 2018-08-28 DIAGNOSIS — R188 Other ascites: Secondary | ICD-10-CM | POA: Diagnosis present

## 2018-08-28 DIAGNOSIS — J209 Acute bronchitis, unspecified: Secondary | ICD-10-CM | POA: Diagnosis not present

## 2018-08-28 DIAGNOSIS — Z79890 Hormone replacement therapy (postmenopausal): Secondary | ICD-10-CM | POA: Diagnosis not present

## 2018-08-28 DIAGNOSIS — R0602 Shortness of breath: Secondary | ICD-10-CM

## 2018-08-28 DIAGNOSIS — T380X5A Adverse effect of glucocorticoids and synthetic analogues, initial encounter: Secondary | ICD-10-CM | POA: Diagnosis present

## 2018-08-28 DIAGNOSIS — J84112 Idiopathic pulmonary fibrosis: Secondary | ICD-10-CM

## 2018-08-28 DIAGNOSIS — Z833 Family history of diabetes mellitus: Secondary | ICD-10-CM | POA: Diagnosis not present

## 2018-08-28 DIAGNOSIS — K5900 Constipation, unspecified: Secondary | ICD-10-CM | POA: Diagnosis present

## 2018-08-28 DIAGNOSIS — Z87891 Personal history of nicotine dependence: Secondary | ICD-10-CM | POA: Diagnosis not present

## 2018-08-28 DIAGNOSIS — K219 Gastro-esophageal reflux disease without esophagitis: Secondary | ICD-10-CM

## 2018-08-28 DIAGNOSIS — Z79899 Other long term (current) drug therapy: Secondary | ICD-10-CM | POA: Diagnosis not present

## 2018-08-28 DIAGNOSIS — Z7984 Long term (current) use of oral hypoglycemic drugs: Secondary | ICD-10-CM | POA: Diagnosis not present

## 2018-08-28 DIAGNOSIS — E1165 Type 2 diabetes mellitus with hyperglycemia: Secondary | ICD-10-CM | POA: Diagnosis present

## 2018-08-28 DIAGNOSIS — Z7951 Long term (current) use of inhaled steroids: Secondary | ICD-10-CM | POA: Diagnosis not present

## 2018-08-28 DIAGNOSIS — E669 Obesity, unspecified: Secondary | ICD-10-CM | POA: Diagnosis present

## 2018-08-28 LAB — CBC
HCT: 30.6 % — ABNORMAL LOW (ref 36.0–46.0)
HCT: 30.1 % — ABNORMAL LOW (ref 36.0–46.0)
HCT: 29.8 % — ABNORMAL LOW (ref 36.0–46.0)
Hemoglobin: 9.8 g/dL — ABNORMAL LOW (ref 12.0–15.0)
Hemoglobin: 9.9 g/dL — ABNORMAL LOW (ref 12.0–15.0)
Hemoglobin: 9.7 g/dL — ABNORMAL LOW (ref 12.0–15.0)
MCH: 28.9 pg (ref 26.0–34.0)
MCH: 29.2 pg (ref 26.0–34.0)
MCH: 29.6 pg (ref 26.0–34.0)
MCHC: 32 g/dL (ref 30.0–36.0)
MCHC: 32.9 g/dL (ref 30.0–36.0)
MCHC: 32.6 g/dL (ref 30.0–36.0)
MCV: 90.3 fL (ref 80.0–100.0)
MCV: 88.8 fL (ref 80.0–100.0)
MCV: 90.9 fL (ref 80.0–100.0)
Platelets: 71 10*3/uL — ABNORMAL LOW (ref 150–400)
Platelets: 96 10*3/uL — ABNORMAL LOW (ref 150–400)
Platelets: 103 10*3/uL — ABNORMAL LOW (ref 150–400)
RBC: 3.39 MIL/uL — ABNORMAL LOW (ref 3.87–5.11)
RBC: 3.39 MIL/uL — ABNORMAL LOW (ref 3.87–5.11)
RBC: 3.28 MIL/uL — ABNORMAL LOW (ref 3.87–5.11)
RDW: 14.4 % (ref 11.5–15.5)
RDW: 14.6 % (ref 11.5–15.5)
RDW: 14.6 % (ref 11.5–15.5)
WBC: 2 10*3/uL — ABNORMAL LOW (ref 4.0–10.5)
WBC: 3.7 10*3/uL — ABNORMAL LOW (ref 4.0–10.5)
WBC: 4 10*3/uL (ref 4.0–10.5)
nRBC: 0 % (ref 0.0–0.2)
nRBC: 0 % (ref 0.0–0.2)
nRBC: 0 % (ref 0.0–0.2)

## 2018-08-28 LAB — CBC WITH DIFFERENTIAL/PLATELET
Abs Immature Granulocytes: 0.03 10*3/uL (ref 0.00–0.07)
Basophils Absolute: 0.1 10*3/uL (ref 0.0–0.1)
Basophils Relative: 2 %
Eosinophils Absolute: 0.1 10*3/uL (ref 0.0–0.5)
Eosinophils Relative: 4 %
HCT: 32.1 % — ABNORMAL LOW (ref 36.0–46.0)
Hemoglobin: 10.7 g/dL — ABNORMAL LOW (ref 12.0–15.0)
Immature Granulocytes: 1 %
Lymphocytes Relative: 19 %
Lymphs Abs: 0.4 10*3/uL — ABNORMAL LOW (ref 0.7–4.0)
MCH: 29.7 pg (ref 26.0–34.0)
MCHC: 33.3 g/dL (ref 30.0–36.0)
MCV: 89.2 fL (ref 80.0–100.0)
Monocytes Absolute: 0.2 10*3/uL (ref 0.1–1.0)
Monocytes Relative: 9 %
Neutro Abs: 1.3 10*3/uL — ABNORMAL LOW (ref 1.7–7.7)
Neutrophils Relative %: 65 %
Platelets: 76 10*3/uL — ABNORMAL LOW (ref 150–400)
RBC: 3.6 MIL/uL — ABNORMAL LOW (ref 3.87–5.11)
RDW: 14.3 % (ref 11.5–15.5)
WBC: 2.1 10*3/uL — ABNORMAL LOW (ref 4.0–10.5)
nRBC: 0 % (ref 0.0–0.2)

## 2018-08-28 LAB — COMPREHENSIVE METABOLIC PANEL
ALT: 42 U/L (ref 0–44)
AST: 62 U/L — ABNORMAL HIGH (ref 15–41)
Albumin: 2.7 g/dL — ABNORMAL LOW (ref 3.5–5.0)
Alkaline Phosphatase: 168 U/L — ABNORMAL HIGH (ref 38–126)
Anion gap: 6 (ref 5–15)
BUN: 13 mg/dL (ref 6–20)
CO2: 19 mmol/L — ABNORMAL LOW (ref 22–32)
Calcium: 7.9 mg/dL — ABNORMAL LOW (ref 8.9–10.3)
Chloride: 109 mmol/L (ref 98–111)
Creatinine, Ser: 0.61 mg/dL (ref 0.44–1.00)
GFR calc Af Amer: >60 mL/min (ref >60)
GFR calc non Af Amer: >60 mL/min (ref >60)
Glucose, Bld: 293 mg/dL — ABNORMAL HIGH (ref 70–99)
Potassium: 3.8 mmol/L (ref 3.5–5.1)
Sodium: 134 mmol/L — ABNORMAL LOW (ref 135–145)
Total Bilirubin: 1.9 mg/dL — ABNORMAL HIGH (ref 0.3–1.2)
Total Protein: 7.1 g/dL (ref 6.5–8.1)

## 2018-08-28 LAB — TYPE AND SCREEN
ABO/RH(D): O POS
Antibody Screen: NEGATIVE

## 2018-08-28 LAB — RESPIRATORY PANEL BY PCR

## 2018-08-28 LAB — PROCALCITONIN: Procalcitonin: <0.1 ng/mL

## 2018-08-28 LAB — D-DIMER, QUANTITATIVE: D-Dimer, Quant: 0.89 ug/mL-FEU — ABNORMAL HIGH (ref 0.00–0.50)

## 2018-08-28 LAB — BASIC METABOLIC PANEL
Anion gap: 6 (ref 5–15)
BUN: 18 mg/dL (ref 6–20)
CO2: 21 mmol/L — ABNORMAL LOW (ref 22–32)
Calcium: 8.1 mg/dL — ABNORMAL LOW (ref 8.9–10.3)
Chloride: 107 mmol/L (ref 98–111)
Creatinine, Ser: 0.63 mg/dL (ref 0.44–1.00)
GFR calc Af Amer: >60 mL/min (ref >60)
GFR calc non Af Amer: >60 mL/min (ref >60)
Glucose, Bld: 289 mg/dL — ABNORMAL HIGH (ref 70–99)
Potassium: 4.2 mmol/L (ref 3.5–5.1)
Sodium: 134 mmol/L — ABNORMAL LOW (ref 135–145)

## 2018-08-28 LAB — SEDIMENTATION RATE: Sed Rate: 56 mm/hr — ABNORMAL HIGH (ref 0–22)

## 2018-08-28 LAB — C-REACTIVE PROTEIN: CRP: <0.8 mg/dL (ref <1.0)

## 2018-08-28 LAB — APTT: aPTT: 31 seconds (ref 24–36)

## 2018-08-28 LAB — MRSA PCR SCREENING: MRSA by PCR: NEGATIVE

## 2018-08-28 LAB — PROTIME-INR
INR: 1.4 — ABNORMAL HIGH (ref 0.8–1.2)
Prothrombin Time: 16.9 seconds — ABNORMAL HIGH (ref 11.4–15.2)

## 2018-08-28 LAB — GLUCOSE, CAPILLARY
Glucose-Capillary: 295 mg/dL — ABNORMAL HIGH (ref 70–99)
Glucose-Capillary: 315 mg/dL — ABNORMAL HIGH (ref 70–99)
Glucose-Capillary: 315 mg/dL — ABNORMAL HIGH (ref 70–99)
Glucose-Capillary: 323 mg/dL — ABNORMAL HIGH (ref 70–99)

## 2018-08-28 LAB — SARS CORONAVIRUS 2 BY RT PCR (HOSPITAL ORDER, PERFORMED IN ~~LOC~~ HOSPITAL LAB): SARS Coronavirus 2: NEGATIVE

## 2018-08-28 LAB — FIBRINOGEN: Fibrinogen: 256 mg/dL (ref 210–475)

## 2018-08-28 LAB — TRIGLYCERIDES: Triglycerides: 81 mg/dL (ref <150)

## 2018-08-28 LAB — FERRITIN: Ferritin: 12 ng/mL (ref 11–307)

## 2018-08-28 LAB — INFLUENZA PANEL BY PCR (TYPE A & B)
Influenza A By PCR: NEGATIVE
Influenza B By PCR: NEGATIVE

## 2018-08-28 LAB — LACTATE DEHYDROGENASE: LDH: 201 U/L — ABNORMAL HIGH (ref 98–192)

## 2018-08-28 LAB — ABO/RH: ABO/RH(D): O POS

## 2018-08-28 LAB — BRAIN NATRIURETIC PEPTIDE: B Natriuretic Peptide: 9.3 pg/mL (ref 0.0–100.0)

## 2018-08-28 LAB — SAVE SMEAR(SSMR), FOR PROVIDER SLIDE REVIEW

## 2018-08-28 LAB — POC OCCULT BLOOD, ED: Fecal Occult Bld: NEGATIVE

## 2018-08-28 MED ORDER — IPRATROPIUM BROMIDE 0.02 % IN SOLN
0.5000 mg | RESPIRATORY_TRACT | Status: DC
  Administered 2018-08-28: 08:00:00 0.5 mg via RESPIRATORY_TRACT
  Filled 2018-08-28: qty 2.5

## 2018-08-28 MED ORDER — MOMETASONE FUROATE 200 MCG/ACT IN AERO: 2.0000 | INHALATION_SPRAY | Freq: Two times a day (BID) | RESPIRATORY_TRACT | Status: DC

## 2018-08-28 MED ORDER — ACETAMINOPHEN 650 MG RE SUPP: 650.0000 mg | Freq: Four times a day (QID) | RECTAL | Status: DC | PRN

## 2018-08-28 MED ORDER — ONDANSETRON HCL 4 MG/2ML IJ SOLN: 4.0000 mg | Freq: Four times a day (QID) | INTRAMUSCULAR | Status: DC | PRN

## 2018-08-28 MED ORDER — ALBUTEROL SULFATE HFA 108 (90 BASE) MCG/ACT IN AERS
2.0000 | INHALATION_SPRAY | Freq: Once | RESPIRATORY_TRACT | Status: AC
  Administered 2018-08-28: 2 via RESPIRATORY_TRACT
  Filled 2018-08-28: qty 6.7

## 2018-08-28 MED ORDER — LEVOTHYROXINE SODIUM 100 MCG PO TABS
250.0000 ug | ORAL_TABLET | Freq: Every day | ORAL | Status: DC
  Administered 2018-08-28 – 2018-09-02 (×6): 250 ug via ORAL
  Filled 2018-08-28 (×6): qty 3

## 2018-08-28 MED ORDER — ONDANSETRON HCL 4 MG PO TABS
4.0000 mg | ORAL_TABLET | Freq: Four times a day (QID) | ORAL | Status: DC | PRN
  Administered 2018-08-28: 07:00:00 4 mg via ORAL
  Filled 2018-08-28: qty 1

## 2018-08-28 MED ORDER — LIP MEDEX EX OINT
TOPICAL_OINTMENT | CUTANEOUS | Status: DC | PRN
  Filled 2018-08-28: qty 7

## 2018-08-28 MED ORDER — BUDESONIDE 0.5 MG/2ML IN SUSP
0.5000 mg | Freq: Two times a day (BID) | RESPIRATORY_TRACT | Status: DC
  Administered 2018-08-28 – 2018-09-02 (×11): 0.5 mg via RESPIRATORY_TRACT
  Filled 2018-08-28 (×11): qty 2

## 2018-08-28 MED ORDER — ACETAMINOPHEN 325 MG PO TABS: 650.0000 mg | ORAL_TABLET | Freq: Four times a day (QID) | ORAL | Status: DC | PRN

## 2018-08-28 MED ORDER — DM-GUAIFENESIN ER 30-600 MG PO TB12
1.0000 | ORAL_TABLET | Freq: Two times a day (BID) | ORAL | Status: DC | PRN
  Administered 2018-08-29 – 2018-08-31 (×3): 1 via ORAL
  Filled 2018-08-28 (×3): qty 1

## 2018-08-28 MED ORDER — FLUTICASONE PROPIONATE 50 MCG/ACT NA SUSP
2.0000 | Freq: Every day | NASAL | Status: DC
  Administered 2018-08-28 – 2018-09-02 (×6): 2 via NASAL
  Filled 2018-08-28: qty 16

## 2018-08-28 MED ORDER — SODIUM CHLORIDE 0.9 % IV BOLUS (SEPSIS)
1000.0000 mL | Freq: Once | INTRAVENOUS | Status: AC
  Administered 2018-08-28: 1000 mL via INTRAVENOUS

## 2018-08-28 MED ORDER — METHYLPREDNISOLONE SODIUM SUCC 40 MG IJ SOLR
40.0000 mg | Freq: Three times a day (TID) | INTRAMUSCULAR | Status: DC
  Administered 2018-08-28 – 2018-09-01 (×12): 40 mg via INTRAVENOUS
  Filled 2018-08-28 (×12): qty 1

## 2018-08-28 MED ORDER — IPRATROPIUM BROMIDE HFA 17 MCG/ACT IN AERS
2.0000 | INHALATION_SPRAY | RESPIRATORY_TRACT | Status: DC
  Filled 2018-08-28: qty 12.9

## 2018-08-28 MED ORDER — AEROCHAMBER PLUS FLO-VU MEDIUM MISC
1.0000 | Freq: Once | Status: AC
  Administered 2018-08-28: 1
  Filled 2018-08-28: qty 1

## 2018-08-28 MED ORDER — LORATADINE 10 MG PO TABS
10.0000 mg | ORAL_TABLET | Freq: Every day | ORAL | Status: DC
  Administered 2018-08-28 – 2018-09-02 (×6): 10 mg via ORAL
  Filled 2018-08-28 (×6): qty 1

## 2018-08-28 MED ORDER — IPRATROPIUM-ALBUTEROL 0.5-2.5 (3) MG/3ML IN SOLN
3.0000 mL | Freq: Four times a day (QID) | RESPIRATORY_TRACT | Status: DC
  Administered 2018-08-28 – 2018-08-30 (×10): 3 mL via RESPIRATORY_TRACT
  Filled 2018-08-28 (×10): qty 3

## 2018-08-28 MED ORDER — ONDANSETRON HCL 4 MG/2ML IJ SOLN
4.0000 mg | Freq: Once | INTRAMUSCULAR | Status: AC
  Administered 2018-08-28: 4 mg via INTRAVENOUS

## 2018-08-28 MED ORDER — IPRATROPIUM-ALBUTEROL 0.5-2.5 (3) MG/3ML IN SOLN: 3.0000 mL | Freq: Two times a day (BID) | RESPIRATORY_TRACT | Status: DC

## 2018-08-28 MED ORDER — ALBUTEROL SULFATE HFA 108 (90 BASE) MCG/ACT IN AERS: 2.0000 | INHALATION_SPRAY | RESPIRATORY_TRACT | Status: DC | PRN

## 2018-08-28 MED ORDER — SODIUM CHLORIDE (PF) 0.9 % IJ SOLN
INTRAMUSCULAR | Status: AC
  Filled 2018-08-28: qty 50

## 2018-08-28 MED ORDER — INSULIN ASPART 100 UNIT/ML ~~LOC~~ SOLN
0.0000 [IU] | Freq: Every day | SUBCUTANEOUS | Status: DC
  Administered 2018-08-28: 21:00:00 4 [IU] via SUBCUTANEOUS

## 2018-08-28 MED ORDER — PANTOPRAZOLE SODIUM 40 MG PO TBEC
40.0000 mg | DELAYED_RELEASE_TABLET | Freq: Two times a day (BID) | ORAL | Status: DC
  Administered 2018-08-28 – 2018-09-02 (×12): 40 mg via ORAL
  Filled 2018-08-28 (×12): qty 1

## 2018-08-28 MED ORDER — INSULIN ASPART 100 UNIT/ML ~~LOC~~ SOLN
0.0000 [IU] | Freq: Three times a day (TID) | SUBCUTANEOUS | Status: DC
  Administered 2018-08-28: 7 [IU] via SUBCUTANEOUS
  Administered 2018-08-28: 5 [IU] via SUBCUTANEOUS
  Administered 2018-08-28 – 2018-08-29 (×2): 7 [IU] via SUBCUTANEOUS

## 2018-08-28 MED ORDER — IOHEXOL 350 MG/ML SOLN
100.0000 mL | Freq: Once | INTRAVENOUS | Status: AC | PRN
  Administered 2018-08-28: 100 mL via INTRAVENOUS

## 2018-08-28 NOTE — ED Notes (Signed)
Waiting for CT angio before bringing patient in floor.

## 2018-08-28 NOTE — H&P (Signed)
History and Physical    Holly Mueller PQD:[id number] DOB: 04/24/1966 DOA: 08/27/2018  Referring MD/NP/PA:   PCP: [name], MD   Patient coming from:  The patient is coming from home.  At baseline, pt is independent for most of ADL.        Chief Complaint: Hemoptysis, shortness of breath, cough  HPI: Holly Mueller is a 53 y.o. female with medical history significant of interstitial lung disease, diabetes mellitus, GERD, hypothyroidism, liver cirrhosis without ascites, pancytopenia, who presents with hemoptysis, shortness of breath and cough.  Patient states that she has been having worsening shortness of breath for more than 1 week.  She has cough with little clear mucus production.  She does not have chest pain.  No fever or chills.  Patient states that she had little runny nose due to allergy, no sore throat.  Patient states that since yesterday, she has been coughing up blood intermittently, at least 7 times so far, with dark-colored blood per patient.  She has nausea and vomited once. Currently no vomiting, diarrhea or abdominal pain.  Denies symptoms of UTI or unilateral weakness.  Patient states he does not drink alcohol.  ED Course: pt was found to have negative COVID-19 test, negative FOBT, d-dimer 0.89, pancytopenia with WBC 2.1, hemoglobin 10.7, platelets 76 (patient had WBC 2.0, hemoglobin 11.5, platelet 68 on 07/19/2018), INR 1.4, renal function okay, temperature normal, soft blood pressure, oxygen saturation 93 to 95% on room air.  Chest x-ray showed stable interstitial lung disease.  Patient is admitted to stepdown C inpatient.  PCCM, Dr. Jimmy Footman was consulted.  Review of Systems:   General: no fevers, chills, no body weight gain, has fatigue HEENT: no blurry vision, hearing changes or sore throat Respiratory: has dyspnea, coughing, no wheezing. Has hemoptysis. CV: no chest pain, no palpitations GI: has nausea, vomiting, no abdominal pain, diarrhea,  constipation GU: no dysuria, burning on urination, increased urinary frequency, hematuria  Ext: no leg edema Neuro: no unilateral weakness, numbness, or tingling, no vision change or hearing loss Skin: no rash, no skin tear. MSK: No muscle spasm, no deformity, no limitation of range of movement in spin Heme: No easy bruising.  Travel history: No recent long distant travel.  Allergy:  Allergies  Allergen Reactions   Aspirin Nausea Only    Upset stomach    Past Medical History:  Diagnosis Date   Arthritis    bursitis left hip flares-not an issue   Diabetes mellitus without complication ([place])    Edentulous    10-19-13 at present   Epilepsy ([place]) in 1972   No seizures since 1972. Previously treated with phenobarbital.    Fever 06/2017   GERD (gastroesophageal reflux disease) 2008   Headache(784.0)    hx of migraines    Hypothyroidism 2008   Lower esophageal ring 08/18/2013   Seasonal allergies 2003   Shortness of breath     Past Surgical History:  Procedure Laterality Date   BALLOON DILATION N/A 10/24/2013   Procedure: BALLOON DILATION;  Surgeon: Gatha Mayer, MD;  Location: [facility];  Service: Endoscopy;  Laterality: N/A;   CESAREAN SECTION     x2   DENTAL SURGERY     multiple extractions 3'15   ESOPHAGOGASTRODUODENOSCOPY N/A 10/24/2013   Procedure: ESOPHAGOGASTRODUODENOSCOPY (EGD);  Surgeon: Gatha Mayer, MD;  Location: [facility];  Service: Endoscopy;  Laterality: N/A;   LIVER BIOPSY  06/30/2012   Procedure: LIVER BIOPSY;  Surgeon: Shann Medal, MD;  Location: [facility];  Service: General;;   NOVASURE ABLATION     SHOULDER ARTHROSCOPY Right 1027   UMBILICAL HERNIA REPAIR N/A 06/30/2012   Procedure: remove umbilicus;  Surgeon: Shann Medal, MD;  Location: [facility];  Service: General;  Laterality: N/A;   VENTRAL HERNIA REPAIR N/A 06/30/2012   Procedure: LAPAROSCOPIC VENTRAL HERNIA;  Surgeon: Shann Medal, MD;  Location: [facility];  Service:  General;  Laterality: N/A;  With Mesh   VIDEO BRONCHOSCOPY Bilateral 07/20/2018   Procedure: VIDEO BRONCHOSCOPY WITHOUT FLUORO;  Surgeon: Brand Males, MD;  Location: [facility];  Service: Cardiopulmonary;  Laterality: Bilateral;   WISDOM TOOTH EXTRACTION      Social History:  reports that she quit smoking about 23 years ago. Her smoking use included cigarettes. She smoked 0.25 packs per day. She has never used smokeless tobacco. She reports current alcohol use. She reports that she does not use drugs.  Family History:  Family History  Problem Relation Age of Onset   Hypertension Mother    Diabetes Mother    Lung cancer Father    Stroke Father    Diabetes Sister    Hypertension Sister      Prior to Admission medications   Medication Sig Start Date End Date Taking? Authorizing Provider  ACCU-CHEK SOFTCLIX LANCETS lancets USE UP TO 4 TIMES DAILY AS DIRECTED 04/25/18   Rory Percy, DO  acetaminophen (TYLENOL) 500 MG tablet Take 1,000 mg by mouth every 8 (eight) hours as needed (pain).    [provider]  albuterol (PROAIR HFA) 108 (90 Base) MCG/ACT inhaler INHALE 2 PUFFS INTO THE LUNGS EVERY 6 HOURS AS NEEDED FOR SHORTNESS OF BREATH 04/25/18   Rory Percy, DO  albuterol (PROVENTIL) (2.5 MG/3ML) 0.083% nebulizer solution Take 3 mLs (2.5 mg total) by nebulization every 6 (six) hours as needed for wheezing or shortness of breath. 04/25/18   Rory Percy, DO  blood glucose meter kit and supplies KIT Dispense based on patient and insurance preference. Use up to four times daily as directed. (FOR ICD-9 250.00, 250.01). 06/19/17   Rory Percy, DO  cetirizine (ZYRTEC) 10 MG tablet Take 1 tablet (10 mg total) by mouth daily. 01/27/17   [name], MD  fluticasone (FLONASE) 50 MCG/ACT nasal spray Place 2 sprays into both nostrils daily.    [provider]  glucose blood (ACCU-CHEK AVIVA PLUS) test strip USE UP TO 4 TIMES DAILY AS DIRECTED 04/25/18    Rory Percy, DO  ibuprofen (ADVIL,MOTRIN) 200 MG tablet Take 600 mg by mouth every 6 (six) hours as needed for moderate pain.    [provider]  levothyroxine (SYNTHROID, LEVOTHROID) 125 MCG tablet Take 2 tablets (250 mcg total) by mouth daily. 07/26/18   [name], MD  metFORMIN (GLUCOPHAGE) 500 MG tablet Take 1 tablet (500 mg total) by mouth 2 (two) times daily with a meal. 07/26/18   [name], MD  Mometasone Furoate [facility] HFA) 200 MCG/ACT AERO Inhale 2 puffs into the lungs 2 (two) times daily. 05/25/18   Brand Males, MD  pantoprazole (PROTONIX) 40 MG tablet Take 1 tablet (40 mg total) by mouth 2 (two) times daily before a meal. 07/26/18   [name], MD  ursodiol (ACTIGALL) 500 MG tablet Take 1 tablet (500 mg total) by mouth 2 (two) times daily. 03/03/18   Zenia Resides, MD    Physical Exam: Vitals:   08/28/18 0130 08/28/18 0200 08/28/18 0230 08/28/18 0300  BP: 112/77 132/78 111/74 117/82  Pulse:  97 94 97 95  Resp: (!) _0 (!) 26  Temp:      TempSrc:      SpO2: 97% 96% 96% 94%  Weight:      Height:       General: Not in acute distress HEENT:       Eyes: PERRL, EOMI, no scleral icterus.       ENT: No discharge from the ears and nose, no pharynx injection, no tonsillar enlargement.        Neck: No JVD, no bruit, no mass felt. Heme: No neck lymph node enlargement. Cardiac: S1/S2, RRR, No murmurs, No gallops or rubs. Respiratory: Has decreased air movement bilaterally. No rales, wheezing, rhonchi or rubs. GI: Soft, nondistended, nontender, no rebound pain, no organomegaly, BS present. GU: No hematuria Ext: No pitting leg edema bilaterally. 2+DP/PT pulse bilaterally. Musculoskeletal: No joint deformities, No joint redness or warmth, no limitation of ROM in spin. Skin: No rashes.  Neuro: Alert, oriented X3, cranial nerves II-XII grossly intact, moves all extremities normally.  Psych: Patient is not psychotic, no suicidal or  hemocidal ideation.  Labs on Admission: I have personally reviewed following labs and imaging studies  CBC: Recent Labs  Lab 08/27/18 2345  WBC 2.1*  NEUTROABS 1.3*  HGB 10.7*  HCT 32.1*  MCV 89.2  PLT 76*   Basic Metabolic Panel: Recent Labs  Lab 08/27/18 2345  NA 134*  K 3.8  CL 109  CO2 19*  GLUCOSE 293*  BUN 13  CREATININE 0.61  CALCIUM 7.9*   GFR: Estimated Creatinine Clearance: 102.3 mL/min (by C-G formula based on SCr of 0.61 mg/dL). Liver Function Tests: Recent Labs  Lab 08/27/18 2345  AST 62*  ALT 42  ALKPHOS 168*  BILITOT 1.9*  PROT 7.1  ALBUMIN 2.7*   No results for input(s): LIPASE, AMYLASE in the last 168 hours. No results for input(s): AMMONIA in the last 168 hours. Coagulation Profile: Recent Labs  Lab 08/27/18 2345  INR 1.4*   Cardiac Enzymes: No results for input(s): CKTOTAL, CKMB, CKMBINDEX, TROPONINI in the last 168 hours. BNP (last 3 results) No results for input(s): PROBNP in the last 8760 hours. HbA1C: No results for input(s): HGBA1C in the last 72 hours. CBG: No results for input(s): GLUCAP in the last 168 hours. Lipid Profile: Recent Labs    08/27/18 2348  TRIG 81   Thyroid Function Tests: No results for input(s): TSH, T4TOTAL, FREET4, T3FREE, THYROIDAB in the last 72 hours. Anemia Panel: Recent Labs    08/27/18 2348  FERRITIN 12   Urine analysis:    Component Value Date/Time   COLORURINE YELLOW 12/28/2017 1950   APPEARANCEUR CLEAR 12/28/2017 1950   LABSPEC 1.012 12/28/2017 1950   PHURINE 7.0 12/28/2017 1950   GLUCOSEU NEGATIVE 12/28/2017 1950   HGBUR SMALL (A) 12/28/2017 1950   BILIRUBINUR NEGATIVE 12/28/2017 1950   KETONESUR NEGATIVE 12/28/2017 1950   PROTEINUR NEGATIVE 12/28/2017 1950   UROBILINOGEN 2.0 (H) 11/22/2013 1743   NITRITE NEGATIVE 12/28/2017 1950   LEUKOCYTESUR NEGATIVE 12/28/2017 1950   Sepsis Labs: _1 (procalcitonin:4,lacticidven:4) ) Recent Results (from the past 240 hour(s))   SARS Coronavirus 2 [facility] order, Performed in [place] hospital lab)     Status: None   Collection Time: 08/27/18 11:48 PM  Result Value Ref Range Status   SARS Coronavirus 2 NEGATIVE NEGATIVE Final    Comment: (NOTE) If result is NEGATIVE SARS-CoV-2 target nucleic acids are NOT DETECTED. The SARS-CoV-2 RNA is generally detectable in upper  and lower  respiratory specimens during the acute phase of infection. The lowest  concentration of SARS-CoV-2 viral copies this assay can detect is 250  copies / mL. A negative result does not preclude SARS-CoV-2 infection  and should not be used as the sole basis for treatment or other  patient management decisions.  A negative result may occur with  improper specimen collection / handling, submission of specimen other  than nasopharyngeal swab, presence of viral mutation(s) within the  areas targeted by this assay, and inadequate number of viral copies  (<250 copies / mL). A negative result must be combined with clinical  observations, patient history, and epidemiological information. If result is POSITIVE SARS-CoV-2 target nucleic acids are DETECTED. The SARS-CoV-2 RNA is generally detectable in upper and lower  respiratory specimens dur ing the acute phase of infection.  Positive  results are indicative of active infection with SARS-CoV-2.  Clinical  correlation with patient history and other diagnostic information is  necessary to determine patient infection status.  Positive results do  not rule out bacterial infection or co-infection with other viruses. If result is PRESUMPTIVE POSTIVE SARS-CoV-2 nucleic acids MAY BE PRESENT.   A presumptive positive result was obtained on the submitted specimen  and confirmed on repeat testing.  While 2019 novel coronavirus  (SARS-CoV-2) nucleic acids may be present in the submitted sample  additional confirmatory testing may be necessary for epidemiological  and / or clinical management purposes   to differentiate between  SARS-CoV-2 and other Sarbecovirus currently known to infect humans.  If clinically indicated additional testing with an alternate test  methodology [phone]) is advised. The SARS-CoV-2 RNA is generally  detectable in upper and lower respiratory sp ecimens during the acute  phase of infection. The expected result is Negative. Fact Sheet for Patients:  StrictlyIdeas.no Fact Sheet for Healthcare Providers: BankingDealers.co.za This test is not yet approved or cleared by the [place] FDA and has been authorized for detection and/or diagnosis of SARS-CoV-2 by FDA under an Emergency Use Authorization (EUA).  This EUA will remain in effect (meaning this test can be used) for the duration of the COVID-19 declaration under Section 564(b)(1) of the Act, 21 U.S.C. section 360bbb-3(b)(1), unless the authorization is terminated or revoked sooner. Performed at [facility] [place] [address] [place], [place] [address]      Radiological Exams on Admission: Dg Chest Port 1 View  Result Date: 08/28/2018 CLINICAL DATA:  53 y/o  F; cough, shortness of breath. EXAM: PORTABLE CHEST 1 VIEW COMPARISON:  07/05/2018 chest radiograph. FINDINGS: Stable cardiac silhouette given projection and technique. Low lung volumes. Stable coarse reticular opacities compatible with interstitial lung disease. No new consolidation, effusion, or pneumothorax identified. No acute osseous abnormality is evident. IMPRESSION: Stable low lung volumes and findings of interstitial lung disease. No acute pulmonary process identified. Electronically Signed   By: Kristine Garbe M.D.   On: 08/28/2018 00:21     EKG:  Not done in ED, will get one.   Assessment/Plan Principal Problem:   Hemoptysis Active Problems:   Hypothyroidism   GERD (gastroesophageal reflux disease)   Cirrhosis of liver without ascites (HCC)   Type 2 diabetes  mellitus without complication, without long-term current use of insulin (HCC)   ILD (interstitial lung disease) (HCC)   Pancytopenia (HCC)   Hemoptysis: Etiology is not clear.  Potential differential diagnosis include thrombocytopenia induced bleeding, vasculitis and PE given positive d-dimer.  Chest x-ray showed stable interstitial lung disease. hgb dropped  slightly from 11.5-10.7.  INR=1.4. pending PTT. PCCM, Dr. Jimmy Footman was consulted-->will see in AM  -admit to SDU as inpt -Check PR3 ANCA, ESR, CRP, and anti-GBM antibody,C3,C4 -CTA to r/o PE-->negative for PE -LE doppler to r/o DVT -INR/PTT/type & screen   ILD (interstitial lung disease) [facility]): Patient reports worsening shortness of breath.  COVID-19 negative. -Continue bronchodilators -prn Mucinex for cough -Check flu PCR and RVP  Hypothyroidism: -Continue Synthroid  GERD (gastroesophageal reflux disease): -Protonix  Cirrhosis of liver without ascites ([place]): Mental status normal.  INR 1.4, pending PTT.  Liver function with ALP 168, AST 62, ALT 42, total bilirubin 1.9. -no acute issues  Type 2 diabetes mellitus without complication, without long-term current use of insulin ([place]): Last A1c 5.5 on 09/10/17, well controled. Patient is taking metformin at home -[facility]I  Pancytopenia [facility]): this is now a new issue, but does not seem to have worked up. WBC 2.1, hemoglobin 10.7, platelets 76 (patient had WBC 2.0, hemoglobin 11.5, platelet 68 on 07/19/2018).  Etiology is not clear. -will check anemia panel -Peripheral smear -May need to consult hematologist.    Inpatient status:  # Patient requires inpatient status due to high intensity of service, high risk for further deterioration and high frequency of surveillance required.  I certify that at the point of admission it is my clinical judgment that the patient will require inpatient hospital care spanning beyond 2 midnights from the point of admission.   This patient has multiple  chronic comorbidities including interstitial lung disease, diabetes mellitus, GERD, hypothyroidism, liver cirrhosis without ascites, pancytopenia  Now patient has presenting with hemoptysis and worsening shortness of breath  The worrisome physical exam findings include has decreased air movement.  The initial radiographic and laboratory data are worrisome because of positive d-dimer, pancytopenia, abnormal liver function  Current medical needs: please see my assessment and plan  Predictability of an adverse outcome (risk): Patient has multiple comorbidities, now presents with hemoptysis without clear etiology.  Also has worsening shortness of breath.  Patient is at high risk for deteriorating.  Will need to be treated in hospital for at least 2 days      DVT ppx: none (Because of her hemoptysis, cannot use Lovenox/heparin; pending lower extremity venous Doppler to rule out DVT, cannot start SCD at this moment. If LE doppler is negative for DVT, may need to start SCD). Code Status: Full code (I discussed with patient, patient wants to be full code). Family Communication: None at bed side.  Disposition Plan:  Anticipate discharge back to previous home environment Consults called: PCCM, Dr. Jimmy Footman Admission status: SDU/inpation       Date of Service 08/28/2018    [facility]   If 7PM-7AM, please contact night-coverage [email or web address] Password [email or web address] 08/28/2018, 3:30 AM

## 2018-08-28 NOTE — Consult Note (Addendum)
NAME:  Holly Mueller, MRN:  [id number], DOB:  16-Aug-1965, LOS: 0 ADMISSION DATE:  08/27/2018, CONSULTATION DATE:  08/28/2018 REFERRING MD:  TRH MD, CHIEF COMPLAINT:  Hemoptysis In ILD with cirrhosis. COVID - negative   Brief History   See hpi  History of present illness   53 year old female with interstitial lung disease followed at the interstitial lung disease clinic by Dr. Chase Caller.  She has probable UIP on radiology associated with trace autoimmune positive in early 2019 (ANA 1: 80 and SSA greater than 8.0, M2Ab 181 +, and low C3 and C4)  with associated cirrhosis with chronic thrombocytopenia platelet 51,000.  She has had progressive ILD based on worsening on the CT scan between July 2018 and November 2019.  Her most recent pulmonary function test in March 2020 with FVC 1.87 L / 50% with a DLCO 15.56/69%.  In terms of exposure she has had exposure to mold in the past and exposed to secondhand smoke when last seen in the interstitial lung disease clinic the differential diagnosis was idiopathic pulmonary fibrosis versus NSIP versus interstitial lung disease with autoimmune features.  She met criteria for the surgical lung biopsy but given multiple medical issues and low platelets this was deferred.  She had a bronchoscopy with lavage on July 20, 2018.  The lavage was mixed cellularity with 28% lymphocytes, 54% neutrophils and 16% macrophage [normally greater than 95% is macrophages).  These results have not been shared with her because of the ongoing pandemic.  She now presents with 1 week history of shortness of breath with dry cough but without any chest pain fever or chills.  Also some runny nose.  And then 1 day prior to admission started having hemoptysis at least 7 times.  Nauseated and vomited once.  No diarrhea abdominal pain.  She denies any alcohol intake.  Critical care medicine has been asked to evaluate.  At this point in time she is stable on room air.  She is intermittent severe dry  cough with retching and she brings out streaky hemoptysis.  A COVID-19  is negative.  CT angiogram chest is negative for pulmonary embolism and the pulmonary fibrosis itself is stable compared to November 2019.  There is no report of any lung mass.  This CT scan was personally visualized.  Seasonal respiratory virus panel is pending at this time.  Of note a gastroenterology appointment was set up but this also has not happened because of the ongoing pandemic    Past Medical History     has a past medical history of Arthritis, Diabetes mellitus without complication ([place]), Edentulous, Epilepsy ([place]) (in 1972), Fever (06/2017), GERD (gastroesophageal reflux disease) (2008), Headache(784.0), Hypothyroidism (2008), Lower esophageal ring (08/18/2013), Seasonal allergies (2003), and Shortness of breath.   reports that she quit smoking about 23 years ago. Her smoking use included cigarettes. She smoked 0.25 packs per day. She has never used smokeless tobacco.  Past Surgical History:  Procedure Laterality Date   BALLOON DILATION N/A 10/24/2013   Procedure: BALLOON DILATION;  Surgeon: Gatha Mayer, MD;  Location: [facility];  Service: Endoscopy;  Laterality: N/A;   CESAREAN SECTION     x2   DENTAL SURGERY     multiple extractions 3'15   ESOPHAGOGASTRODUODENOSCOPY N/A 10/24/2013   Procedure: ESOPHAGOGASTRODUODENOSCOPY (EGD);  Surgeon: Gatha Mayer, MD;  Location: [facility];  Service: Endoscopy;  Laterality: N/A;   LIVER BIOPSY  06/30/2012   Procedure: LIVER BIOPSY;  Surgeon: Shann Medal, MD;  Location: [facility];  Service: General;;   NOVASURE ABLATION     SHOULDER ARTHROSCOPY Right 8527   UMBILICAL HERNIA REPAIR N/A 06/30/2012   Procedure: remove umbilicus;  Surgeon: Shann Medal, MD;  Location: [facility];  Service: General;  Laterality: N/A;   VENTRAL HERNIA REPAIR N/A 06/30/2012   Procedure: LAPAROSCOPIC VENTRAL HERNIA;  Surgeon: Shann Medal, MD;  Location: [facility];  Service:  General;  Laterality: N/A;  With Mesh   VIDEO BRONCHOSCOPY Bilateral 07/20/2018   Procedure: VIDEO BRONCHOSCOPY WITHOUT FLUORO;  Surgeon: Brand Males, MD;  Location: [facility];  Service: Cardiopulmonary;  Laterality: Bilateral;   WISDOM TOOTH EXTRACTION      Allergies  Allergen Reactions   Aspirin Nausea Only    Upset stomach    Immunization History  Administered Date(s) Administered   Pneumococcal Polysaccharide-23 08/26/2011, 09/12/2017   Td 05/11/2006    Family History  Problem Relation Age of Onset   Hypertension Mother    Diabetes Mother    Lung cancer Father    Stroke Father    Diabetes Sister    Hypertension Sister      Current Facility-Administered Medications:    acetaminophen (TYLENOL) tablet 650 mg, 650 mg, Oral, Q6H PRN **OR** acetaminophen (TYLENOL) suppository 650 mg, 650 mg, Rectal, Q6H PRN, Ivor Costa, MD   albuterol (VENTOLIN HFA) 108 (90 Base) MCG/ACT inhaler 2 puff, 2 puff, Inhalation, Q4H PRN, Ivor Costa, MD   budesonide (PULMICORT) nebulizer solution 0.5 mg, 0.5 mg, Nebulization, BID, Ivor Costa, MD, 0.5 mg at 08/28/18 7824   dextromethorphan-guaiFENesin ([place] DM) 30-600 MG per 12 hr tablet 1 tablet, 1 tablet, Oral, BID PRN, Ivor Costa, MD   fluticasone (FLONASE) 50 MCG/ACT nasal spray 2 spray, 2 spray, Each Nare, Daily, Ivor Costa, MD   insulin aspart (novoLOG) injection 0-5 Units, 0-5 Units, Subcutaneous, QHS, Niu, Xilin, MD   insulin aspart (novoLOG) injection 0-9 Units, 0-9 Units, Subcutaneous, TID WC, Ivor Costa, MD, 5 Units at 08/28/18 2353   ipratropium-albuterol (DUONEB) 0.5-2.5 (3) MG/3ML nebulizer solution 3 mL, 3 mL, Nebulization, BID, Mariel Aloe, MD   levothyroxine (SYNTHROID) tablet 250 mcg, 250 mcg, Oral, Daily, Ivor Costa, MD, 250 mcg at 08/28/18 6144   lip balm (CARMEX) ointment, , Topical, PRN, Ivor Costa, MD   loratadine (CLARITIN) tablet 10 mg, 10 mg, Oral, Daily, Ivor Costa, MD, 10 mg at 08/28/18  0833   ondansetron (ZOFRAN) tablet 4 mg, 4 mg, Oral, Q6H PRN, 4 mg at 08/28/18 0704 **OR** ondansetron (ZOFRAN) injection 4 mg, 4 mg, Intravenous, Q6H PRN, Ivor Costa, MD   pantoprazole (PROTONIX) EC tablet 40 mg, 40 mg, Oral, BID AC, Ivor Costa, MD, 40 mg at 08/28/18 3154   sodium chloride (PF) 0.9 % injection, , , ,    Significant Hospital Events   08/27/2018 - admit 4/19 - ccm consult   Objective   Blood pressure 120/80, pulse 93, temperature 98.3 F (36.8 C), temperature source Oral, resp. rate 18, height 5' 6" (1.676 m), weight 110.2 kg, SpO2 96 %.        Intake/Output Summary (Last 24 hours) at 08/28/2018 1138 Last data filed at 08/28/2018 1000 Gross per 24 hour  Intake 1830 ml  Output --  Net 1830 ml   Filed Weights   08/27/18 2349  Weight: 110.2 kg    Examination: General: looks same like before. Chronic unwell but stavle HENT: no neck nodes Lungs: crackles bilaterally esp LL, velcro Cardiovascular: RRR Abdomen: obese. No ascited Extremities: intact  Neuro: alert and orientexd x 3. No liver flap. Moves all 4s.  GU: Not examined   LABS    PULMONARY No results for input(s): PHART, PCO2ART, PO2ART, HCO3, TCO2, O2SAT in the last 168 hours.  Invalid input(s): PCO2, PO2  CBC Recent Labs  Lab 08/27/18 2345 08/28/18 0646  HGB 10.7* 9.8*  HCT 32.1* 30.6*  WBC 2.1* 2.0*  PLT 76* 71*    COAGULATION Recent Labs  Lab 08/27/18 2345  INR 1.4*    CARDIAC  No results for input(s): TROPONINI in the last 168 hours. No results for input(s): PROBNP in the last 168 hours.   CHEMISTRY Recent Labs  Lab 08/27/18 2345 08/28/18 0646  NA 134* 134*  K 3.8 4.2  CL 109 107  CO2 19* 21*  GLUCOSE 293* 289*  BUN 13 18  CREATININE 0.61 0.63  CALCIUM 7.9* 8.1*   Estimated Creatinine Clearance: 102.3 mL/min (by C-G formula based on SCr of 0.63 mg/dL).   LIVER Recent Labs  Lab 08/27/18 2345  AST 62*  ALT 42  ALKPHOS 168*  BILITOT 1.9*  PROT 7.1   ALBUMIN 2.7*  INR 1.4*     INFECTIOUS Recent Labs  Lab 08/27/18 2345  PROCALCITON <0.10     ENDOCRINE CBG (last 3)  Recent Labs    08/28/18 0754  GLUCAP 295*         IMAGING x48h  - image(s) personally visualized  -   highlighted in bold Ct Angio Chest Pe W Or Wo Contrast  Result Date: 08/28/2018 CLINICAL DATA:  53 y/o  F; chest pain and dyspnea. EXAM: CT ANGIOGRAPHY CHEST WITH CONTRAST TECHNIQUE: Multidetector CT imaging of the chest was performed using the standard protocol during bolus administration of intravenous contrast. Multiplanar CT image reconstructions and MIPs were obtained to evaluate the vascular anatomy. CONTRAST:  173m OMNIPAQUE IOHEXOL 350 MG/ML SOLN COMPARISON:  04/05/2018 CT chest. 08/27/2018 chest radiograph. FINDINGS: Cardiovascular: Preferential opacification of the thoracic aorta. No evidence of thoracic aortic aneurysm or dissection. Normal heart size. No pericardial effusion. Respiratory motion artifact and suboptimal opacification of the pulmonary arteries. No central or proximal segmental pulmonary embolus. Mediastinum/Nodes: Heterogeneous and mildly enlarged thyroid gland. Normal esophagus and trachea. No axillary or mediastinal lymphadenopathy. Lungs/Pleura: Pulmonary fibrosis with peripheral and basilar predominance stable from prior high-resolution chest CT. No consolidation, effusion, or pneumothorax. Upper Abdomen: No acute abnormality. Musculoskeletal: No chest wall abnormality. No acute or significant osseous findings. Review of the MIP images confirms the above findings. IMPRESSION: 1. Respiratory motion artifact and suboptimal opacification of the pulmonary arteries. No central or proximal segmental pulmonary embolus. No acute pulmonary process. 2. Findings of pulmonary fibrosis are stable from prior high-resolution chest CT. Electronically Signed   By: LKristine GarbeM.D.   On: 08/28/2018 04:17   Dg Chest Port 1 View  Result Date:  08/28/2018 CLINICAL DATA:  53y/o  F; cough, shortness of breath. EXAM: PORTABLE CHEST 1 VIEW COMPARISON:  07/05/2018 chest radiograph. FINDINGS: Stable cardiac silhouette given projection and technique. Low lung volumes. Stable coarse reticular opacities compatible with interstitial lung disease. No new consolidation, effusion, or pneumothorax identified. No acute osseous abnormality is evident. IMPRESSION: Stable low lung volumes and findings of interstitial lung disease. No acute pulmonary process identified. Electronically Signed   By: LKristine GarbeM.D.   On: 08/28/2018 00:21   Vas [place] Extremity Venous (dvt)  Result Date: 08/28/2018  Lower Venous Study Indications: SOB.  Risk Factors: Obesity. Performing Technologist: VOrrtanna  Examination Guidelines: A complete evaluation includes B-mode imaging, spectral Doppler, color Doppler, and power Doppler as needed of all accessible portions of each vessel. Bilateral testing is considered an integral part of a complete examination. Limited examinations for reoccurring indications may be performed as noted.  +---------+---------------+---------+-----------+----------+------------------+  RIGHT     Compressibility Phasicity Spontaneity Properties Summary             +---------+---------------+---------+-----------+----------+------------------+  CFV       Full            Yes       Yes                                        +---------+---------------+---------+-----------+----------+------------------+  SFJ       Full                                                                 +---------+---------------+---------+-----------+----------+------------------+  FV Prox   Full            Yes       Yes                                        +---------+---------------+---------+-----------+----------+------------------+  FV Mid    Full                                                                  +---------+---------------+---------+-----------+----------+------------------+  FV Distal Full            Yes       Yes                                        +---------+---------------+---------+-----------+----------+------------------+  PFV       Full            Yes       Yes                                        +---------+---------------+---------+-----------+----------+------------------+  POP       Full            Yes       Yes                                        +---------+---------------+---------+-----------+----------+------------------+  PTV       Full                                                                 +---------+---------------+---------+-----------+----------+------------------+  PERO      Full                                             Difficult to image  +---------+---------------+---------+-----------+----------+------------------+   +---------+---------------+---------+-----------+----------+------------------+  LEFT      Compressibility Phasicity Spontaneity Properties Summary             +---------+---------------+---------+-----------+----------+------------------+  CFV       Full            Yes       Yes                                        +---------+---------------+---------+-----------+----------+------------------+  SFJ       Full                                                                 +---------+---------------+---------+-----------+----------+------------------+  FV Prox   Full            Yes       Yes                                        +---------+---------------+---------+-----------+----------+------------------+  FV Mid    Full                                                                 +---------+---------------+---------+-----------+----------+------------------+  FV Distal Full            Yes       Yes                                        +---------+---------------+---------+-----------+----------+------------------+  PFV       Full             Yes       Yes                                        +---------+---------------+---------+-----------+----------+------------------+  POP       Full            Yes       Yes                                        +---------+---------------+---------+-----------+----------+------------------+  PTV       Full  Difficult to image  +---------+---------------+---------+-----------+----------+------------------+  PERO      Full                                             Difficult to image  +---------+---------------+---------+-----------+----------+------------------+     Summary: Right: There is no evidence of deep vein thrombosis in the lower extremity. No cystic structure found in the popliteal fossa. Left: There is no evidence of deep vein thrombosis in the lower extremity. No cystic structure found in the popliteal fossa.  *See table(s) above for measurements and observations.    Preliminary      Resolved Hospital Problem list   x  Assessment & Plan:  Hemoptysis -pulmonary believes him and COVID-19 have been ruled out.  This fits involved with acute bronchitis not otherwise specified with underlying pulmonary fibrosis and mild thrombocytopenia should probably benefit from bronchodilatation and also steroids. Alternatively, might be flare up of autoimmune/vasculitis -> will check ANA, DS DNA, complements, and GBM and ANCA again (last checked 1 year ago)   ILD   = Based on mixd cellularity this is likely UIP.  She has some lymphocytosis but it is not exaggerated above 40% thus making sarcoidosis or hypersensitive pneumonitis unlikely.  Given the presence of several autoimmune bodies and female gender  this is very likely interstitial lung disease with autoimmune features of idiopathic variety [I PAF with likely pathologic UIP.  There is some element of progression between 2018 and 2020 but overall this is only mild.  She will probably benefit from  anti-fibrotic's.  However, liver disease is a limitation with current oral anti-fibrotic's.  We we can address this as an outpatient.  Cirrhosis -She will definitely need to reestablish with GI at [place] will follow Can go to floor  :    SIGNATURE    Dr. Brand Males, M.D., F.C.C.P,  Pulmonary and Critical Care Medicine Staff Physician, [place] Director - Interstitial Lung Disease  Program  [facility] [place] [facility] [place], [place], [address]  Pager: [phone], If no answer or between  15:00h - 7:00h: call [phone] Telephone: [phone]  12:03 PM 08/28/2018

## 2018-08-28 NOTE — Progress Notes (Addendum)
Bilateral lower extremity venous duplex completed. Preliminary results in Chart review CV Proc. [facility], [place] 08/28/2018, 11:25 AM

## 2018-08-28 NOTE — Progress Notes (Signed)
Patient seen and examined at bedside, patient admitted after midnight, please see earlier detailed admission note by Ivor Costa, MD. Briefly, patient presented with hemoptysis. Hemoglobin down slightly from baseline. No recurrent hemoptysis. PCCM consulted.Will watch hemoglobin today. PCCM recommendations. Can transfer to medical floor today. Some nausea which improved with Zofran.   Cordelia Poche, MD [facility] 08/28/2018, 12:11 PM

## 2018-08-28 NOTE — ED Notes (Signed)
ED TO INPATIENT HANDOFF REPORT  Name/Age/Gender Holly Mueller 53 y.o. female  Code Status    Code Status Orders  (From admission, onward)         Start     Ordered   08/28/18 0315  Full code  Continuous     08/28/18 0315        Code Status History    Date Active Date Inactive Code Status Order ID Comments User Context   12/29/2017 0004 01/05/2018 1847 Partial Code [id number]  Everrett Coombe, MD ED   09/11/2017 0251 09/13/2017 1458 Partial Code [id number]  Bonnita Hollow, MD ED   06/30/2017 0357 07/04/2017 1539 Partial Code [id number]  Lovenia Kim, MD Inpatient   06/15/2017 0045 06/19/2017 2001 Full Code [id number]  Rory Percy, DO ED      Home/SNF/Other Home  Chief Complaint hemoptysis  Level of Care/Admitting Diagnosis ED Disposition    ED Disposition Condition [facility] Area: [facility] [[id number]]  Level of Care: Stepdown [14]  Admit to SDU based on following criteria: Hemodynamic compromise or significant risk of instability:  Patient requiring short term acute titration and management of vasoactive drips, and invasive monitoring (i.e., CVP and Arterial line).  Admit to SDU based on following criteria: Other see comments  Comments: Hemoptysis  Covid Evaluation: N/A  Diagnosis: Hemoptysis [629528]  Admitting Physician: Ivor Costa [[id number]]  Attending Physician: Ivor Costa [phone]  Estimated length of stay: past midnight tomorrow  Certification:: I certify this patient will need inpatient services for at least 2 midnights  PT Class (Do Not Modify): Inpatient [101]  PT Acc Code (Do Not Modify): Private [1]       Medical History Past Medical History:  Diagnosis Date  . Arthritis    bursitis left hip flares-not an issue  . Diabetes mellitus without complication ([place])   . Edentulous    10-19-13 at present  . Epilepsy ([place]) in 1972   No seizures since 1972. Previously treated with phenobarbital.   . Fever 06/2017  . GERD  (gastroesophageal reflux disease) 2008  . Headache(784.0)    hx of migraines   . Hypothyroidism 2008  . Lower esophageal ring 08/18/2013  . Seasonal allergies 2003  . Shortness of breath     Allergies Allergies  Allergen Reactions  . Aspirin Nausea Only    Upset stomach    IV Location/Drains/Wounds Patient Lines/Drains/Airways Status   Active Line/Drains/Airways    Name:   Placement date:   Placement time:   Site:   Days:   Peripheral IV 08/28/18 Right Forearm   08/28/18    0006    Forearm   less than 1   Peripheral IV 08/28/18 Left Antecubital   08/28/18    0006    Antecubital   less than 1          Labs/Imaging Results for orders placed or performed during the hospital encounter of 08/27/18 (from the past 48 hour(s))  CBC with Differential/Platelet     Status: Abnormal   Collection Time: 08/27/18 11:45 PM  Result Value Ref Range   WBC 2.1 (L) 4.0 - 10.5 K/uL   RBC 3.60 (L) 3.87 - 5.11 MIL/uL   Hemoglobin 10.7 (L) 12.0 - 15.0 g/dL   HCT 32.1 (L) 36.0 - 46.0 %   MCV 89.2 80.0 - 100.0 fL   MCH 29.7 26.0 - 34.0 pg   MCHC 33.3 30.0 - 36.0 g/dL   RDW 14.3 11.5 - 15.5 %  Platelets 76 (L) 150 - 400 K/uL    Comment: Immature Platelet Fraction may be clinically indicated, consider ordering this additional test [id number]    nRBC 0.0 0.0 - 0.2 %   Neutrophils Relative % 65 %   Neutro Abs 1.3 (L) 1.7 - 7.7 K/uL   Lymphocytes Relative 19 %   Lymphs Abs 0.4 (L) 0.7 - 4.0 K/uL   Monocytes Relative 9 %   Monocytes Absolute 0.2 0.1 - 1.0 K/uL   Eosinophils Relative 4 %   Eosinophils Absolute 0.1 0.0 - 0.5 K/uL   Basophils Relative 2 %   Basophils Absolute 0.1 0.0 - 0.1 K/uL   Immature Granulocytes 1 %   Abs Immature Granulocytes 0.03 0.00 - 0.07 K/uL    Comment: Performed at [facility], [place] [address] [place], [place] [address]  Protime-INR     Status: Abnormal   Collection Time: 08/27/18 11:45 PM  Result Value Ref Range   Prothrombin Time 16.9 (H)  11.4 - 15.2 seconds   INR 1.4 (H) 0.8 - 1.2    Comment: (NOTE) INR goal varies based on device and disease states. Performed at [facility], [place] [address], [place], [place] [address]   Comprehensive metabolic panel     Status: Abnormal   Collection Time: 08/27/18 11:45 PM  Result Value Ref Range   Sodium 134 (L) 135 - 145 mmol/L   Potassium 3.8 3.5 - 5.1 mmol/L   Chloride 109 98 - 111 mmol/L   CO2 19 (L) 22 - 32 mmol/L   Glucose, Bld 293 (H) 70 - 99 mg/dL   BUN 13 6 - 20 mg/dL   Creatinine, Ser 0.61 0.44 - 1.00 mg/dL   Calcium 7.9 (L) 8.9 - 10.3 mg/dL   Total Protein 7.1 6.5 - 8.1 g/dL   Albumin 2.7 (L) 3.5 - 5.0 g/dL   AST 62 (H) 15 - 41 U/L   ALT 42 0 - 44 U/L   Alkaline Phosphatase 168 (H) 38 - 126 U/L   Total Bilirubin 1.9 (H) 0.3 - 1.2 mg/dL   GFR calc non Af Amer >60 >60 mL/min   GFR calc Af Amer >60 >60 mL/min   Anion gap 6 5 - 15    Comment: Performed at [facility], [place] [address], [place], [place] [address]  D-dimer, quantitative     Status: Abnormal   Collection Time: 08/27/18 11:45 PM  Result Value Ref Range   D-Dimer, Quant 0.89 (H) 0.00 - 0.50 ug/mL-FEU    Comment: (NOTE) At the manufacturer cut-off of 0.50 ug/mL FEU, this assay has been documented to exclude PE with a sensitivity and negative predictive value of 97 to 99%.  At this time, this assay has not been approved by the FDA to exclude DVT/VTE. Results should be correlated with clinical presentation. Performed at [facility], [place] [address], [place],  [address]   Procalcitonin     Status: None   Collection Time: 08/27/18 11:45 PM  Result Value Ref Range   Procalcitonin <0.10 ng/mL    Comment:        Interpretation: PCT (Procalcitonin) <= 0.5 ng/mL: Systemic infection (sepsis) is not likely. Local bacterial infection is possible. (NOTE)       Sepsis PCT Algorithm           Lower Respiratory Tract  Infection PCT Algorithm    ----------------------------     ----------------------------         PCT < 0.25 ng/mL                PCT < 0.10 ng/mL         Strongly encourage             Strongly discourage   discontinuation of antibiotics    initiation of antibiotics    ----------------------------     -----------------------------       PCT 0.25 - 0.50 ng/mL            PCT 0.10 - 0.25 ng/mL               OR       >80% decrease in PCT            Discourage initiation of                                            antibiotics      Encourage discontinuation           of antibiotics    ----------------------------     -----------------------------         PCT >= 0.50 ng/mL              PCT 0.26 - 0.50 ng/mL               AND        <80% decrease in PCT             Encourage initiation of                                             antibiotics       Encourage continuation           of antibiotics    ----------------------------     -----------------------------        PCT >= 0.50 ng/mL                  PCT > 0.50 ng/mL               AND         increase in PCT                  Strongly encourage                                      initiation of antibiotics    Strongly encourage escalation           of antibiotics                                     -----------------------------                                           PCT <= 0.25 ng/mL  OR                                        > 80% decrease in PCT                                     Discontinue / Do not initiate                                             antibiotics Performed at [place] [address], [place], [place] [address]   Lactate dehydrogenase     Status: Abnormal   Collection Time: 08/27/18 11:45 PM  Result Value Ref Range   LDH 201 (H) 98 - 192 U/L    Comment: Performed at [facility], [place] [address], [place], [place]  [address]  Fibrinogen     Status: None   Collection Time: 08/27/18 11:45 PM  Result Value Ref Range   Fibrinogen 256 210 - 475 mg/dL    Comment: Performed at [facility], [place] [address], [place], [place] [address]  Type and screen     Status: None   Collection Time: 08/27/18 11:46 PM  Result Value Ref Range   ABO/RH(D) O POS    Antibody Screen NEG    Sample Expiration      08/30/2018 Performed at [facility], [place] [address], [place], [place] [address]   SARS Coronavirus 2 [facility] order, Performed in [facility] hospital lab)     Status: None   Collection Time: 08/27/18 11:48 PM  Result Value Ref Range   SARS Coronavirus 2 NEGATIVE NEGATIVE    Comment: (NOTE) If result is NEGATIVE SARS-CoV-2 target nucleic acids are NOT DETECTED. The SARS-CoV-2 RNA is generally detectable in upper and lower  respiratory specimens during the acute phase of infection. The lowest  concentration of SARS-CoV-2 viral copies this assay can detect is 250  copies / mL. A negative result does not preclude SARS-CoV-2 infection  and should not be used as the sole basis for treatment or other  patient management decisions.  A negative result may occur with  improper specimen collection / handling, submission of specimen other  than nasopharyngeal swab, presence of viral mutation(s) within the  areas targeted by this assay, and inadequate number of viral copies  (<250 copies / mL). A negative result must be combined with clinical  observations, patient history, and epidemiological information. If result is POSITIVE SARS-CoV-2 target nucleic acids are DETECTED. The SARS-CoV-2 RNA is generally detectable in upper and lower  respiratory specimens dur ing the acute phase of infection.  Positive  results are indicative of active infection with SARS-CoV-2.  Clinical  correlation with patient history and other diagnostic information is  necessary to determine patient infection  status.  Positive results do  not rule out bacterial infection or co-infection with other viruses. If result is PRESUMPTIVE POSTIVE SARS-CoV-2 nucleic acids MAY BE PRESENT.   A presumptive positive result was obtained on the submitted specimen  and confirmed on repeat testing.  While 2019 novel coronavirus  (SARS-CoV-2) nucleic acids may be present in the submitted sample  additional confirmatory testing may be necessary for  epidemiological  and / or clinical management purposes  to differentiate between  SARS-CoV-2 and other Sarbecovirus currently known to infect humans.  If clinically indicated additional testing with an alternate test  methodology [phone]) is advised. The SARS-CoV-2 RNA is generally  detectable in upper and lower respiratory sp ecimens during the acute  phase of infection. The expected result is Negative. Fact Sheet for Patients:  StrictlyIdeas.no Fact Sheet for Healthcare Providers: BankingDealers.co.za This test is not yet approved or cleared by the [place] FDA and has been authorized for detection and/or diagnosis of SARS-CoV-2 by FDA under an Emergency Use Authorization (EUA).  This EUA will remain in effect (meaning this test can be used) for the duration of the COVID-19 declaration under Section 564(b)(1) of the Act, 21 U.S.C. section 360bbb-3(b)(1), unless the authorization is terminated or revoked sooner. Performed at [facility], [place] [address] [place], [place] [address]   Ferritin     Status: None   Collection Time: 08/27/18 11:48 PM  Result Value Ref Range   Ferritin 12 11 - 307 ng/mL    Comment: Performed at [facility], [place] [address] [place], [place] [address]  Triglycerides     Status: None   Collection Time: 08/27/18 11:48 PM  Result Value Ref Range   Triglycerides 81 <150 mg/dL    Comment: Performed at [facility], [place]  [address] [place], [place] [address]  C-reactive protein     Status: None   Collection Time: 08/27/18 11:48 PM  Result Value Ref Range   CRP <0.8 <1.0 mg/dL    Comment: Performed at [facility], [place] [address] [place], [place] [address]  POC occult blood, ED     Status: None   Collection Time: 08/28/18 12:08 AM  Result Value Ref Range   Fecal Occult Bld NEGATIVE NEGATIVE   Dg Chest Port 1 View  Result Date: 08/28/2018 CLINICAL DATA:  53 y/o  F; cough, shortness of breath. EXAM: PORTABLE CHEST 1 VIEW COMPARISON:  07/05/2018 chest radiograph. FINDINGS: Stable cardiac silhouette given projection and technique. Low lung volumes. Stable coarse reticular opacities compatible with interstitial lung disease. No new consolidation, effusion, or pneumothorax identified. No acute osseous abnormality is evident. IMPRESSION: Stable low lung volumes and findings of interstitial lung disease. No acute pulmonary process identified. Electronically Signed   By: Kristine Garbe M.D.   On: 08/28/2018 00:21    Pending Labs Unresulted Labs (From admission, onward)    Start     Ordered   08/29/18 0000  Vitamin B12  (Anemia Panel (PNL))  Once,   R     08/28/18 0310   08/29/18 0000  Folate  (Anemia Panel (PNL))  Once,   R     08/28/18 0310   08/29/18 0000  Iron and TIBC  (Anemia Panel (PNL))  Once,   R     08/28/18 0310   08/29/18 0000  Ferritin  (Anemia Panel (PNL))  Once,   R     08/28/18 0310   08/29/18 0000  Reticulocytes  (Anemia Panel (PNL))  Once,   R     08/28/18 0310   08/28/18 7622  Basic metabolic panel  Tomorrow morning,   R     08/28/18 0315   08/28/18 0326  Save Smear  Once,   R     08/28/18 0325   08/28/18 0326  Pathologist smear review  Once,   R     08/28/18 0325  08/28/18 0313  Influenza panel by PCR (type A & B)  Once,   R     08/28/18 0312   08/28/18 0313  Respiratory Panel by PCR  (Respiratory virus panel with precautions)  Once,   R     08/28/18 0312    08/28/18 0311  Brain natriuretic peptide  Once,   R     08/28/18 0310   08/28/18 0311  APTT  Once,   R     08/28/18 0310   08/28/18 0311  CBC  Now then every 6 hours,   R     08/28/18 0311   08/28/18 0309  Glomerular basement membrane antibodies  Once,   R     08/28/18 0308   08/28/18 0309  C3 complement  Once,   R     08/28/18 0308   08/28/18 0309  C4 complement  Once,   R     08/28/18 0308   08/28/18 0308  Sedimentation rate  Once,   R     08/28/18 0308   08/28/18 0301  ANA, IFA (with reflex)  Once,   R     08/28/18 0308   08/28/18 0301  Mpo/pr-3 (anca) antibodies  Once,   R     08/28/18 0308   08/27/18 2346  ABO/Rh  Once,   R     08/27/18 2346          Vitals/Pain Today's Vitals   08/28/18 0130 08/28/18 0200 08/28/18 0230 08/28/18 0300  BP: 112/77 132/78 111/74 117/82  Pulse: 97 94 97 95  Resp: (!) _0 (!) 26  Temp:      TempSrc:      SpO2: 97% 96% 96% 94%  Weight:      Height:        Isolation Precautions Droplet precaution  Medications Medications  levothyroxine (SYNTHROID) tablet 250 mcg (has no administration in time range)  pantoprazole (PROTONIX) EC tablet 40 mg (has no administration in time range)  albuterol (VENTOLIN HFA) 108 (90 Base) MCG/ACT inhaler 2 puff (has no administration in time range)  loratadine (CLARITIN) tablet 10 mg (has no administration in time range)  fluticasone (FLONASE) 50 MCG/ACT nasal spray 2 spray (has no administration in time range)  Mometasone Furoate AERO 2 puff (has no administration in time range)  ipratropium (ATROVENT HFA) inhaler 2 puff (has no administration in time range)  dextromethorphan-guaiFENesin (MUCINEX DM) 30-600 MG per 12 hr tablet 1 tablet (has no administration in time range)  insulin aspart (novoLOG) injection 0-9 Units (has no administration in time range)  insulin aspart (novoLOG) injection 0-5 Units (has no administration in time range)  acetaminophen (TYLENOL) tablet 650 mg (has no administration  in time range)    Or  acetaminophen (TYLENOL) suppository 650 mg (has no administration in time range)  ondansetron (ZOFRAN) tablet 4 mg (has no administration in time range)    Or  ondansetron (ZOFRAN) injection 4 mg (has no administration in time range)  sodium chloride (PF) 0.9 % injection (has no administration in time range)  iohexol (OMNIPAQUE) 350 MG/ML injection 100 mL (has no administration in time range)  sodium chloride 0.9 % bolus 1,000 mL (0 mLs Intravenous Stopped 08/28/18 0048)  ondansetron (ZOFRAN) injection 4 mg (4 mg Intravenous Given 08/28/18 0018)  albuterol (VENTOLIN HFA) 108 (90 Base) MCG/ACT inhaler 2 puff (2 puffs Inhalation Given 08/28/18 0331)  AeroChamber Plus Flo-Vu Medium MISC 1 each (1 each Other Given 08/28/18 0331)  Mobility walks with device

## 2018-08-28 NOTE — Progress Notes (Signed)
Patient mews score yellow due to pulse 105 and respirations 22.  Patient seen and assessed.  Patient is stable. MD notified of changes to mews score via secure chat. Will continue to monitor.

## 2018-08-29 ENCOUNTER — Inpatient Hospital Stay (HOSPITAL_COMMUNITY): Payer: Medicaid Other

## 2018-08-29 DIAGNOSIS — R1084 Generalized abdominal pain: Secondary | ICD-10-CM

## 2018-08-29 LAB — CBC
HCT: 28.7 % — ABNORMAL LOW (ref 36.0–46.0)
HCT: 27.6 % — ABNORMAL LOW (ref 36.0–46.0)
HCT: 25.9 % — ABNORMAL LOW (ref 36.0–46.0)
HCT: 25.8 % — ABNORMAL LOW (ref 36.0–46.0)
Hemoglobin: 9 g/dL — ABNORMAL LOW (ref 12.0–15.0)
Hemoglobin: 8.8 g/dL — ABNORMAL LOW (ref 12.0–15.0)
Hemoglobin: 8.6 g/dL — ABNORMAL LOW (ref 12.0–15.0)
Hemoglobin: 8.4 g/dL — ABNORMAL LOW (ref 12.0–15.0)
MCH: 28.5 pg (ref 26.0–34.0)
MCH: 28.6 pg (ref 26.0–34.0)
MCH: 29.3 pg (ref 26.0–34.0)
MCH: 28.6 pg (ref 26.0–34.0)
MCHC: 31.4 g/dL (ref 30.0–36.0)
MCHC: 31.9 g/dL (ref 30.0–36.0)
MCHC: 33.2 g/dL (ref 30.0–36.0)
MCHC: 32.6 g/dL (ref 30.0–36.0)
MCV: 90.8 fL (ref 80.0–100.0)
MCV: 89.6 fL (ref 80.0–100.0)
MCV: 88.1 fL (ref 80.0–100.0)
MCV: 87.8 fL (ref 80.0–100.0)
Platelets: 101 10*3/uL — ABNORMAL LOW (ref 150–400)
Platelets: 101 10*3/uL — ABNORMAL LOW (ref 150–400)
Platelets: 97 10*3/uL — ABNORMAL LOW (ref 150–400)
Platelets: 90 10*3/uL — ABNORMAL LOW (ref 150–400)
RBC: 3.16 MIL/uL — ABNORMAL LOW (ref 3.87–5.11)
RBC: 3.08 MIL/uL — ABNORMAL LOW (ref 3.87–5.11)
RBC: 2.94 MIL/uL — ABNORMAL LOW (ref 3.87–5.11)
RBC: 2.94 MIL/uL — ABNORMAL LOW (ref 3.87–5.11)
RDW: 14.6 % (ref 11.5–15.5)
RDW: 14.7 % (ref 11.5–15.5)
RDW: 14.6 % (ref 11.5–15.5)
RDW: 14.6 % (ref 11.5–15.5)
WBC: 4.5 10*3/uL (ref 4.0–10.5)
WBC: 4.9 10*3/uL (ref 4.0–10.5)
WBC: 5.7 10*3/uL (ref 4.0–10.5)
WBC: 5.4 10*3/uL (ref 4.0–10.5)
nRBC: 0 % (ref 0.0–0.2)
nRBC: 0 % (ref 0.0–0.2)
nRBC: 0.4 % — ABNORMAL HIGH (ref 0.0–0.2)
nRBC: 0.6 % — ABNORMAL HIGH (ref 0.0–0.2)

## 2018-08-29 LAB — VITAMIN B12: Vitamin B-12: 703 pg/mL (ref 180–914)

## 2018-08-29 LAB — GLUCOSE, CAPILLARY
Glucose-Capillary: 339 mg/dL — ABNORMAL HIGH (ref 70–99)
Glucose-Capillary: 374 mg/dL — ABNORMAL HIGH (ref 70–99)
Glucose-Capillary: 318 mg/dL — ABNORMAL HIGH (ref 70–99)
Glucose-Capillary: 356 mg/dL — ABNORMAL HIGH (ref 70–99)

## 2018-08-29 LAB — ANTINUCLEAR ANTIBODIES, IFA: ANA Ab, IFA: NEGATIVE

## 2018-08-29 LAB — FERRITIN: Ferritin: 28 ng/mL (ref 11–307)

## 2018-08-29 LAB — MPO/PR-3 (ANCA) ANTIBODIES
ANCA Proteinase 3: <3.5 U/mL (ref 0.0–3.5)
Myeloperoxidase Abs: <9 U/mL (ref 0.0–9.0)

## 2018-08-29 LAB — RETICULOCYTES
Immature Retic Fract: 23.2 % — ABNORMAL HIGH (ref 2.3–15.9)
RBC.: 3.14 MIL/uL — ABNORMAL LOW (ref 3.87–5.11)
Retic Count, Absolute: 172.4 10*3/uL (ref 19.0–186.0)
Retic Ct Pct: 5.5 % — ABNORMAL HIGH (ref 0.4–3.1)

## 2018-08-29 LAB — C4 COMPLEMENT: Complement C4, Body Fluid: 7 mg/dL — ABNORMAL LOW (ref 14–44)

## 2018-08-29 LAB — C3 COMPLEMENT: C3 Complement: 55 mg/dL — ABNORMAL LOW (ref 82–167)

## 2018-08-29 LAB — PATHOLOGIST SMEAR REVIEW

## 2018-08-29 LAB — IRON AND TIBC
Iron: 334 ug/dL — ABNORMAL HIGH (ref 28–170)
Saturation Ratios: 85 % — ABNORMAL HIGH (ref 10.4–31.8)
TIBC: 392 ug/dL (ref 250–450)
UIBC: 58 ug/dL

## 2018-08-29 LAB — FOLATE: Folate: 16.6 ng/mL (ref >5.9)

## 2018-08-29 LAB — GLOMERULAR BASEMENT MEMBRANE ANTIBODIES: GBM Ab: 5 units (ref 0–20)

## 2018-08-29 MED ORDER — INSULIN ASPART 100 UNIT/ML ~~LOC~~ SOLN
0.0000 [IU] | Freq: Three times a day (TID) | SUBCUTANEOUS | Status: DC
  Administered 2018-08-29: 20 [IU] via SUBCUTANEOUS
  Administered 2018-08-29: 19:00:00 15 [IU] via SUBCUTANEOUS
  Administered 2018-08-30 (×3): 20 [IU] via SUBCUTANEOUS
  Administered 2018-08-31: 18:00:00 15 [IU] via SUBCUTANEOUS
  Administered 2018-08-31 (×2): 20 [IU] via SUBCUTANEOUS
  Administered 2018-09-01: 11 [IU] via SUBCUTANEOUS
  Administered 2018-09-01: 13:00:00 20 [IU] via SUBCUTANEOUS
  Administered 2018-09-01: 11 [IU] via SUBCUTANEOUS
  Administered 2018-09-02: 09:00:00 3 [IU] via SUBCUTANEOUS
  Administered 2018-09-02: 17:00:00 11 [IU] via SUBCUTANEOUS
  Administered 2018-09-02: 13:00:00 15 [IU] via SUBCUTANEOUS

## 2018-08-29 MED ORDER — INSULIN ASPART 100 UNIT/ML ~~LOC~~ SOLN
0.0000 [IU] | Freq: Every day | SUBCUTANEOUS | Status: DC
  Administered 2018-08-29: 5 [IU] via SUBCUTANEOUS
  Administered 2018-08-30 – 2018-09-01 (×2): 4 [IU] via SUBCUTANEOUS

## 2018-08-29 MED ORDER — TRAMADOL HCL 50 MG PO TABS
50.0000 mg | ORAL_TABLET | Freq: Four times a day (QID) | ORAL | Status: DC | PRN
  Administered 2018-08-29 – 2018-09-02 (×5): 50 mg via ORAL
  Filled 2018-08-29 (×6): qty 1

## 2018-08-29 NOTE — Progress Notes (Signed)
NAME:  Holly Mueller, MRN:  [id number], DOB:  02/20/1966, LOS: 1 ADMISSION DATE:  08/27/2018, CONSULTATION DATE:  08/28/2018 REFERRING MD:  TRH MD, CHIEF COMPLAINT:  Hemoptysis In ILD with cirrhosis. COVID - negative    History of present illness   53 year old female with interstitial lung disease followed at the interstitial lung disease clinic by Dr. Chase Caller.  She has probable UIP on radiology associated with trace autoimmune positive in early 2019 (ANA 1: 80 and SSA greater than 8.0, M2Ab 181 +, and low C3 and C4)  with associated cirrhosis with chronic thrombocytopenia platelet 51,000.  She has had progressive ILD based on worsening on the CT scan between July 2018 and November 2019.  Her most recent pulmonary function test in March 2020 with FVC 1.87 L / 50% with a DLCO 15.56/69%.  In terms of exposure she has had exposure to mold in the past and exposed to secondhand smoke when last seen in the interstitial lung disease clinic the differential diagnosis was idiopathic pulmonary fibrosis versus NSIP versus interstitial lung disease with autoimmune features.  She met criteria for the surgical lung biopsy but given multiple medical issues and low platelets this was deferred.  She had a bronchoscopy with lavage on July 20, 2018.  The lavage was mixed cellularity with 28% lymphocytes, 54% neutrophils and 16% macrophage [normally greater than 95% is macrophages).  These results have not been shared with her because of the ongoing pandemic.  She now presents with 1 week history of shortness of breath with dry cough but without any chest pain fever or chills.  Also some runny nose.  And then 1 day prior to admission started having hemoptysis at least 7 times.  Nauseated and vomited once.  No diarrhea abdominal pain.  She denies any alcohol intake.  Critical care medicine has been asked to evaluate.  At this point in time she is stable on room air.  She is intermittent severe dry cough with retching and  she brings out streaky hemoptysis.  A COVID-19  is negative.  CT angiogram chest is negative for pulmonary embolism and the pulmonary fibrosis itself is stable compared to November 2019.  There is no report of any lung mass.  This CT scan was personally visualized.  Seasonal respiratory virus panel is pending at this time.  Of note a gastroenterology appointment was set up but this also has not happened because of the ongoing pandemic   Past Medical History    has a past medical history of Arthritis, Diabetes mellitus without complication ([place]), Edentulous, Epilepsy ([place]) (in 1972), Fever (06/2017), GERD (gastroesophageal reflux disease) (2008), Headache(784.0), Hypothyroidism (2008), Lower esophageal ring (08/18/2013), Seasonal allergies (2003), and Shortness of breath.   Significant Hospital Events   08/27/2018 - admit 4/19 - ccm consult COVID-19 negative CTA chest 4/19 > Respiratory motion artifact and suboptimal opacification of the pulmonary arteries. No central or proximal segmental pulmonary embolus. No acute pulmonary process. Findings of pulmonary fibrosis are stable from prior high-resolution chest CT. LE doppler 4/19 > There is no evidence of deep vein thrombosis in the lower extremites. No cystic structure found in the popliteal fossa.  INTERVAL HISTORY Breathing a little better. Cough/hemoptysis has improved. Abd pain still an issue. She believes it to be gas.   Objective   Blood pressure 118/75, pulse (!) 106, temperature 98.5 F (36.9 C), temperature source Oral, resp. rate (!) 21, height _0  (1.676 m), weight 110.2 kg, SpO2 96 %.  Intake/Output Summary (Last 24 hours) at 08/29/2018 0932 Last data filed at 08/29/2018 0715 Gross per 24 hour  Intake 835 ml  Output 200 ml  Net 635 ml   Filed Weights   08/27/18 2349  Weight: 110.2 kg    Examination: General: obese female resting in bed with occasional cough fit.  HENT: [place]/AT, No appreciable JVD Lungs: crackles  bilaterally. Worse in bases. R>L. Minimal WOB on room air.  Cardiovascular: RRR no MRG Abdomen: Soft, nondistended Extremities: NO acute deformity. No sig edema.  Neuro: alert and orientexd x 3.  LABS    PULMONARY No results for input(s): PHART, PCO2ART, PO2ART, HCO3, TCO2, O2SAT in the last 168 hours.  Invalid input(s): PCO2, PO2  CBC Recent Labs  Lab 08/28/18 1517 08/28/18 2126 08/29/18 0523  HGB 9.9* 9.7* 9.0*  HCT 30.1* 29.8* 28.7*  WBC 3.7* 4.0 4.5  PLT 96* 103* 101*    COAGULATION Recent Labs  Lab 08/27/18 2345  INR 1.4*    CARDIAC  No results for input(s): TROPONINI in the last 168 hours. No results for input(s): PROBNP in the last 168 hours.   CHEMISTRY Recent Labs  Lab 08/27/18 2345 08/28/18 0646  NA 134* 134*  K 3.8 4.2  CL 109 107  CO2 19* 21*  GLUCOSE 293* 289*  BUN 13 18  CREATININE 0.61 0.63  CALCIUM 7.9* 8.1*   Estimated Creatinine Clearance: 102.3 mL/min (by C-G formula based on SCr of 0.63 mg/dL).   LIVER Recent Labs  Lab 08/27/18 2345  AST 62*  ALT 42  ALKPHOS 168*  BILITOT 1.9*  PROT 7.1  ALBUMIN 2.7*  INR 1.4*     INFECTIOUS Recent Labs  Lab 08/27/18 2345  PROCALCITON <0.10     ENDOCRINE CBG (last 3)  Recent Labs    08/28/18 1818 08/28/18 2042 08/29/18 [facility] Problem list   x  Assessment & Plan:  Hemoptysis -  COVID-19 negative. This fits involved with acute bronchitis not otherwise specified with underlying pulmonary fibrosis and mild thrombocytopenia should probably benefit from bronchodilatation and also steroids. Alternatively, might be flare up of autoimmune/vasculitis  Plan: - SpO2 goal > 90% - Room air - Continue IV steroids - Labs pending include ANA, DS DNA, complements, and GBM and ANCA again (last checked 1 year ago) - Cough suppression    ILD - Based on mixed cellularity this is likely UIP.  She has some lymphocytosis but it is not exaggerated  above 40% thus making sarcoidosis or hypersensitive pneumonitis unlikely.  Given the presence of several autoimmune bodies and female gender  this is very likely interstitial lung disease with autoimmune features of idiopathic variety [I PAF with likely pathologic UIP.  There is some element of progression between 2018 and 2020 but overall this is only mild.   Plan: - She will probably benefit from anti-fibrotic's.  However, liver disease is a limitation with current oral anti-fibrotic's.  We we can address this as an outpatient.  Cirrhosis -She will definitely need to reestablish with GI at [facility], AGACNP-BC [place] Pager [phone] or [phone]  08/29/2018 9:38 AM

## 2018-08-29 NOTE — Telephone Encounter (Signed)
Attempted to call pt but when tried to call, the line rang twice and then received a busy signal. Unable to speak with pt and unable to leave a VM. Will try to call back later.

## 2018-08-29 NOTE — Progress Notes (Signed)
PROGRESS NOTE    Holly Mueller  NOM:[id number] DOB: 1965-07-17 DOA: 08/27/2018 PCP: [name], MD   Brief Narrative: Holly Mueller is a 53 y.o. female with medical history significant of interstitial lung disease, diabetes mellitus, GERD, hypothyroidism, liver cirrhosis without ascites, pancytopenia. She presented secondary to hemoptysis in setting of ILD and acute bronchitis. She is being treated with nebulizer treatments and IV steroids. Monitoring CBC.   Assessment & Plan:   Principal Problem:   Hemoptysis Active Problems:   Hypothyroidism   GERD (gastroesophageal reflux disease)   Cirrhosis of liver without ascites (HCC)   Type 2 diabetes mellitus without complication, without long-term current use of insulin (HCC)   ILD (interstitial lung disease) ([place])   Pancytopenia (HCC)   Hemoptysis Recurrent yesterday. Hemoglobin continues to drop slowly. Unsafe to discharge at this time until hemoglobin stabilizes/hemoptysis slows -PCCM recommendations  ILD Acute bronchitis PCCM on board. Treating for bronchitis with Solu-medrol and nebulizer treatments. -Continue Solu-medrol -PCCM recommendations -Continue bronchodilators -Mucinex and Tussenex prn -ANCA, ANA, anti GBM, DS DNA, C3, C4 pending   Hypothyroidism -Continue Synthroid  GERD -Continue Protonix  Cirrhosis with ascites Some abdominal pain today. No significant abdominal pain -abdominal x-ray  Diabetes mellitus, type 2 Worsened control in setting of steroids. Fasting blood sugar of 339 today -Continue SSI, change to resistant scale  Pancytopenia Chronic. Worsened platelets. WBC improved in setting of steroids.   DVT prophylaxis: SCDs Code Status:   Code Status: Full Code Family Communication: None at bedside Disposition Plan: Discharge pending stabilization of hemoglobin, improvement of hemoptysis and PCCM recommendations   Consultants:   PCCM  Procedures:    None  Antimicrobials:  None    Subjective: Some hemoptysis yesterday afternoon.   Objective: Vitals:   08/29/18 0122 08/29/18 0307 08/29/18 0613 08/29/18 0750  BP:  (!) 104/58 118/75   Pulse:  (!) 109 (!) 106   Resp:  20 (!) 21   Temp:  98.9 F (37.2 C) 98.5 F (36.9 C)   TempSrc:  Oral Oral   SpO2: 95% 92% 94% 96%  Weight:      Height:        Intake/Output Summary (Last 24 hours) at 08/29/2018 1037 Last data filed at 08/29/2018 0715 Gross per 24 hour  Intake 595 ml  Output 200 ml  Net 395 ml   Filed Weights   08/27/18 2349  Weight: 110.2 kg    Examination:  General exam: Appears calm and comfortable Respiratory system: Wheezing bilaterally. Respiratory effort normal. Cardiovascular system: S1 & S2 heard, RRR. No murmurs, rubs, gallops or clicks. Gastrointestinal system: Abdomen is nondistended, soft and nontender. No organomegaly or masses felt. Normal bowel sounds heard. Central nervous system: Alert and oriented. No focal neurological deficits. Extremities: No edema. No calf tenderness Skin: No cyanosis. No rashes Psychiatry: Judgement and insight appear normal. Mood & affect appropriate.     Data Reviewed: I have personally reviewed following labs and imaging studies  CBC: Recent Labs  Lab 08/27/18 2345 08/28/18 0646 08/28/18 1517 08/28/18 2126 08/29/18 0523  WBC 2.1* 2.0* 3.7* 4.0 4.5  NEUTROABS 1.3*  --   --   --   --   HGB 10.7* 9.8* 9.9* 9.7* 9.0*  HCT 32.1* 30.6* 30.1* 29.8* 28.7*  MCV 89.2 90.3 88.8 90.9 90.8  PLT 76* 71* 96* 103* 962*   Basic Metabolic Panel: Recent Labs  Lab 08/27/18 2345 08/28/18 0646  NA 134* 134*  K 3.8 4.2  CL 109 107  CO2  19* 21*  GLUCOSE 293* 289*  BUN 13 18  CREATININE 0.61 0.63  CALCIUM 7.9* 8.1*   GFR: Estimated Creatinine Clearance: 102.3 mL/min (by C-G formula based on SCr of 0.63 mg/dL). Liver Function Tests: Recent Labs  Lab 08/27/18 2345  AST 62*  ALT 42  ALKPHOS 168*  BILITOT 1.9*   PROT 7.1  ALBUMIN 2.7*   No results for input(s): LIPASE, AMYLASE in the last 168 hours. No results for input(s): AMMONIA in the last 168 hours. Coagulation Profile: Recent Labs  Lab 08/27/18 2345  INR 1.4*   Cardiac Enzymes: No results for input(s): CKTOTAL, CKMB, CKMBINDEX, TROPONINI in the last 168 hours. BNP (last 3 results) No results for input(s): PROBNP in the last 8760 hours. HbA1C: No results for input(s): HGBA1C in the last 72 hours. CBG: Recent Labs  Lab 08/28/18 0754 08/28/18 1145 08/28/18 1818 08/28/18 2042 08/29/18 0735  GLUCAP 295* 315* 315* 323* 339*   Lipid Profile: Recent Labs    08/27/18 2348  TRIG 81   Thyroid Function Tests: No results for input(s): TSH, T4TOTAL, FREET4, T3FREE, THYROIDAB in the last 72 hours. Anemia Panel: Recent Labs    08/27/18 2348 08/29/18 0523  VITAMINB12  --  703  FOLATE  --  16.6  FERRITIN 12 28  TIBC  --  392  IRON  --  334*  RETICCTPCT  --  5.5*   Sepsis Labs: Recent Labs  Lab 08/27/18 [place] <0.10    Recent Results (from the past 240 hour(s))  SARS Coronavirus 2 [facility] order, Performed in [place] hospital lab)     Status: None   Collection Time: 08/27/18 11:48 PM  Result Value Ref Range Status   SARS Coronavirus 2 NEGATIVE NEGATIVE Final    Comment: (NOTE) If result is NEGATIVE SARS-CoV-2 target nucleic acids are NOT DETECTED. The SARS-CoV-2 RNA is generally detectable in upper and lower  respiratory specimens during the acute phase of infection. The lowest  concentration of SARS-CoV-2 viral copies this assay can detect is 250  copies / mL. A negative result does not preclude SARS-CoV-2 infection  and should not be used as the sole basis for treatment or other  patient management decisions.  A negative result may occur with  improper specimen collection / handling, submission of specimen other  than nasopharyngeal swab, presence of viral mutation(s) within the  areas targeted by  this assay, and inadequate number of viral copies  (<250 copies / mL). A negative result must be combined with clinical  observations, patient history, and epidemiological information. If result is POSITIVE SARS-CoV-2 target nucleic acids are DETECTED. The SARS-CoV-2 RNA is generally detectable in upper and lower  respiratory specimens dur ing the acute phase of infection.  Positive  results are indicative of active infection with SARS-CoV-2.  Clinical  correlation with patient history and other diagnostic information is  necessary to determine patient infection status.  Positive results do  not rule out bacterial infection or co-infection with other viruses. If result is PRESUMPTIVE POSTIVE SARS-CoV-2 nucleic acids MAY BE PRESENT.   A presumptive positive result was obtained on the submitted specimen  and confirmed on repeat testing.  While 2019 novel coronavirus  (SARS-CoV-2) nucleic acids may be present in the submitted sample  additional confirmatory testing may be necessary for epidemiological  and / or clinical management purposes  to differentiate between  SARS-CoV-2 and other Sarbecovirus currently known to infect humans.  If clinically indicated additional testing with an alternate test  methodology [phone]) is advised. The SARS-CoV-2 RNA is generally  detectable in upper and lower respiratory sp ecimens during the acute  phase of infection. The expected result is Negative. Fact Sheet for Patients:  StrictlyIdeas.no Fact Sheet for Healthcare Providers: BankingDealers.co.za This test is not yet approved or cleared by the [place] FDA and has been authorized for detection and/or diagnosis of SARS-CoV-2 by FDA under an Emergency Use Authorization (EUA).  This EUA will remain in effect (meaning this test can be used) for the duration of the COVID-19 declaration under Section 564(b)(1) of the Act, 21 U.S.C. section  360bbb-3(b)(1), unless the authorization is terminated or revoked sooner. Performed at [facility], [place] [address], [place], [place] [address]   Respiratory Panel by PCR     Status: None   Collection Time: 08/28/18  3:35 AM  Result Value Ref Range Status   Adenovirus NOT DETECTED NOT DETECTED Final   Coronavirus 229E NOT DETECTED NOT DETECTED Final    Comment: (NOTE) The Coronavirus on the Respiratory Panel, DOES NOT test for the novel  Coronavirus (2019 nCoV)    Coronavirus HKU1 NOT DETECTED NOT DETECTED Final   Coronavirus NL63 NOT DETECTED NOT DETECTED Final   Coronavirus OC43 NOT DETECTED NOT DETECTED Final   Metapneumovirus NOT DETECTED NOT DETECTED Final   Rhinovirus / Enterovirus NOT DETECTED NOT DETECTED Final   Influenza A NOT DETECTED NOT DETECTED Final   Influenza B NOT DETECTED NOT DETECTED Final   Parainfluenza Virus 1 NOT DETECTED NOT DETECTED Final   Parainfluenza Virus 2 NOT DETECTED NOT DETECTED Final   Parainfluenza Virus 3 NOT DETECTED NOT DETECTED Final   Parainfluenza Virus 4 NOT DETECTED NOT DETECTED Final   Respiratory Syncytial Virus NOT DETECTED NOT DETECTED Final   Bordetella pertussis NOT DETECTED NOT DETECTED Final   Chlamydophila pneumoniae NOT DETECTED NOT DETECTED Final   Mycoplasma pneumoniae NOT DETECTED NOT DETECTED Final    Comment: Performed at [facility], [address], [place], [place] [address]  MRSA PCR Screening     Status: None   Collection Time: 08/28/18  4:45 AM  Result Value Ref Range Status   MRSA by PCR NEGATIVE NEGATIVE Final    Comment:        The GeneXpert MRSA Assay (FDA approved for NASAL specimens only), is one component of a comprehensive MRSA colonization surveillance program. It is not intended to diagnose MRSA infection nor to guide or monitor treatment for MRSA infections. Performed at [facility], [place] [address] [place], [place] [address]          Radiology  Studies: Ct Angio Chest Pe W Or Wo Contrast  Result Date: 08/28/2018 CLINICAL DATA:  53 y/o  F; chest pain and dyspnea. EXAM: CT ANGIOGRAPHY CHEST WITH CONTRAST TECHNIQUE: Multidetector CT imaging of the chest was performed using the standard protocol during bolus administration of intravenous contrast. Multiplanar CT image reconstructions and MIPs were obtained to evaluate the vascular anatomy. CONTRAST:  150m OMNIPAQUE IOHEXOL 350 MG/ML SOLN COMPARISON:  04/05/2018 CT chest. 08/27/2018 chest radiograph. FINDINGS: Cardiovascular: Preferential opacification of the thoracic aorta. No evidence of thoracic aortic aneurysm or dissection. Normal heart size. No pericardial effusion. Respiratory motion artifact and suboptimal opacification of the pulmonary arteries. No central or proximal segmental pulmonary embolus. Mediastinum/Nodes: Heterogeneous and mildly enlarged thyroid gland. Normal esophagus and trachea. No axillary or mediastinal lymphadenopathy. Lungs/Pleura: Pulmonary fibrosis with peripheral and basilar predominance stable from prior high-resolution chest CT. No consolidation,  effusion, or pneumothorax. Upper Abdomen: No acute abnormality. Musculoskeletal: No chest wall abnormality. No acute or significant osseous findings. Review of the MIP images confirms the above findings. IMPRESSION: 1. Respiratory motion artifact and suboptimal opacification of the pulmonary arteries. No central or proximal segmental pulmonary embolus. No acute pulmonary process. 2. Findings of pulmonary fibrosis are stable from prior high-resolution chest CT. Electronically Signed   By: Kristine Garbe M.D.   On: 08/28/2018 04:17   Dg Chest Port 1 View  Result Date: 08/28/2018 CLINICAL DATA:  53 y/o  F; cough, shortness of breath. EXAM: PORTABLE CHEST 1 VIEW COMPARISON:  07/05/2018 chest radiograph. FINDINGS: Stable cardiac silhouette given projection and technique. Low lung volumes. Stable coarse reticular opacities  compatible with interstitial lung disease. No new consolidation, effusion, or pneumothorax identified. No acute osseous abnormality is evident. IMPRESSION: Stable low lung volumes and findings of interstitial lung disease. No acute pulmonary process identified. Electronically Signed   By: Kristine Garbe M.D.   On: 08/28/2018 00:21   Vas [place] Lower Extremity Venous (dvt)  Result Date: 08/28/2018  Lower Venous Study Indications: SOB.  Risk Factors: Obesity. Performing Technologist: Toma Copier RVS  Examination Guidelines: A complete evaluation includes B-mode imaging, spectral Doppler, color Doppler, and power Doppler as needed of all accessible portions of each vessel. Bilateral testing is considered an integral part of a complete examination. Limited examinations for reoccurring indications may be performed as noted.  +---------+---------------+---------+-----------+----------+------------------+  RIGHT     Compressibility Phasicity Spontaneity Properties Summary             +---------+---------------+---------+-----------+----------+------------------+  CFV       Full            Yes       Yes                                        +---------+---------------+---------+-----------+----------+------------------+  SFJ       Full                                                                 +---------+---------------+---------+-----------+----------+------------------+  FV Prox   Full            Yes       Yes                                        +---------+---------------+---------+-----------+----------+------------------+  FV Mid    Full                                                                 +---------+---------------+---------+-----------+----------+------------------+  FV Distal Full            Yes       Yes                                        +---------+---------------+---------+-----------+----------+------------------+  PFV       Full            Yes       Yes                                         +---------+---------------+---------+-----------+----------+------------------+  POP       Full            Yes       Yes                                        +---------+---------------+---------+-----------+----------+------------------+  PTV       Full                                                                 +---------+---------------+---------+-----------+----------+------------------+  PERO      Full                                             Difficult to image  +---------+---------------+---------+-----------+----------+------------------+   +---------+---------------+---------+-----------+----------+------------------+  LEFT      Compressibility Phasicity Spontaneity Properties Summary             +---------+---------------+---------+-----------+----------+------------------+  CFV       Full            Yes       Yes                                        +---------+---------------+---------+-----------+----------+------------------+  SFJ       Full                                                                 +---------+---------------+---------+-----------+----------+------------------+  FV Prox   Full            Yes       Yes                                        +---------+---------------+---------+-----------+----------+------------------+  FV Mid    Full                                                                 +---------+---------------+---------+-----------+----------+------------------+  FV Distal Full            Yes       Yes                                        +---------+---------------+---------+-----------+----------+------------------+  PFV       Full            Yes       Yes                                        +---------+---------------+---------+-----------+----------+------------------+  POP       Full            Yes       Yes                                        +---------+---------------+---------+-----------+----------+------------------+  PTV        Full                                             Difficult to image  +---------+---------------+---------+-----------+----------+------------------+  PERO      Full                                             Difficult to image  +---------+---------------+---------+-----------+----------+------------------+     Summary: Right: There is no evidence of deep vein thrombosis in the lower extremity. No cystic structure found in the popliteal fossa. Left: There is no evidence of deep vein thrombosis in the lower extremity. No cystic structure found in the popliteal fossa.  *See table(s) above for measurements and observations. Electronically signed by Servando Snare MD on 08/28/2018 at 12:35:40 PM.    Final         Scheduled Meds:  budesonide (PULMICORT) nebulizer solution  0.5 mg Nebulization BID   fluticasone  2 spray Each Nare Daily   insulin aspart  0-5 Units Subcutaneous QHS   insulin aspart  0-9 Units Subcutaneous TID WC   ipratropium-albuterol  3 mL Nebulization Q6H   levothyroxine  250 mcg Oral Daily   loratadine  10 mg Oral Daily   methylPREDNISolone (SOLU-MEDROL) injection  40 mg Intravenous Q8H   pantoprazole  40 mg Oral BID AC   Continuous Infusions:   LOS: 1 day     Cordelia Poche, MD [facility] 08/29/2018, 10:37 AM  If 7PM-7AM, please contact night-coverage [email or web address]

## 2018-08-30 DIAGNOSIS — J849 Interstitial pulmonary disease, unspecified: Secondary | ICD-10-CM

## 2018-08-30 LAB — MPO/PR-3 (ANCA) ANTIBODIES
ANCA Proteinase 3: <3.5 U/mL (ref 0.0–3.5)
Myeloperoxidase Abs: <9 U/mL (ref 0.0–9.0)

## 2018-08-30 LAB — CBC
HCT: 24.9 % — ABNORMAL LOW (ref 36.0–46.0)
HCT: 24.8 % — ABNORMAL LOW (ref 36.0–46.0)
HCT: 24 % — ABNORMAL LOW (ref 36.0–46.0)
Hemoglobin: 8.3 g/dL — ABNORMAL LOW (ref 12.0–15.0)
Hemoglobin: 8.4 g/dL — ABNORMAL LOW (ref 12.0–15.0)
Hemoglobin: 7.9 g/dL — ABNORMAL LOW (ref 12.0–15.0)
MCH: 29.2 pg (ref 26.0–34.0)
MCH: 29.5 pg (ref 26.0–34.0)
MCH: 28.8 pg (ref 26.0–34.0)
MCHC: 33.3 g/dL (ref 30.0–36.0)
MCHC: 33.9 g/dL (ref 30.0–36.0)
MCHC: 32.9 g/dL (ref 30.0–36.0)
MCV: 87.7 fL (ref 80.0–100.0)
MCV: 87 fL (ref 80.0–100.0)
MCV: 87.6 fL (ref 80.0–100.0)
Platelets: 88 10*3/uL — ABNORMAL LOW (ref 150–400)
Platelets: 95 10*3/uL — ABNORMAL LOW (ref 150–400)
Platelets: 89 10*3/uL — ABNORMAL LOW (ref 150–400)
RBC: 2.84 MIL/uL — ABNORMAL LOW (ref 3.87–5.11)
RBC: 2.85 MIL/uL — ABNORMAL LOW (ref 3.87–5.11)
RBC: 2.74 MIL/uL — ABNORMAL LOW (ref 3.87–5.11)
RDW: 14.6 % (ref 11.5–15.5)
RDW: 14.6 % (ref 11.5–15.5)
RDW: 14.5 % (ref 11.5–15.5)
WBC: 5.9 10*3/uL (ref 4.0–10.5)
WBC: 6.6 10*3/uL (ref 4.0–10.5)
WBC: 5.8 10*3/uL (ref 4.0–10.5)
nRBC: 0.7 % — ABNORMAL HIGH (ref 0.0–0.2)
nRBC: 0.6 % — ABNORMAL HIGH (ref 0.0–0.2)
nRBC: 0.7 % — ABNORMAL HIGH (ref 0.0–0.2)

## 2018-08-30 LAB — GLUCOSE, CAPILLARY
Glucose-Capillary: 398 mg/dL — ABNORMAL HIGH (ref 70–99)
Glucose-Capillary: 388 mg/dL — ABNORMAL HIGH (ref 70–99)
Glucose-Capillary: 365 mg/dL — ABNORMAL HIGH (ref 70–99)
Glucose-Capillary: 336 mg/dL — ABNORMAL HIGH (ref 70–99)

## 2018-08-30 LAB — C3 COMPLEMENT: C3 Complement: 53 mg/dL — ABNORMAL LOW (ref 82–167)

## 2018-08-30 LAB — ANTI-DNA ANTIBODY, DOUBLE-STRANDED: ds DNA Ab: 2 IU/mL (ref 0–9)

## 2018-08-30 LAB — C4 COMPLEMENT: Complement C4, Body Fluid: 7 mg/dL — ABNORMAL LOW (ref 14–44)

## 2018-08-30 LAB — ANTINUCLEAR ANTIBODIES, IFA: ANA Ab, IFA: NEGATIVE

## 2018-08-30 MED ORDER — IPRATROPIUM-ALBUTEROL 0.5-2.5 (3) MG/3ML IN SOLN
3.0000 mL | Freq: Four times a day (QID) | RESPIRATORY_TRACT | Status: DC
  Administered 2018-08-31 (×3): 3 mL via RESPIRATORY_TRACT
  Filled 2018-08-30 (×3): qty 3

## 2018-08-30 MED ORDER — INSULIN GLARGINE 100 UNIT/ML ~~LOC~~ SOLN
10.0000 [IU] | Freq: Every day | SUBCUTANEOUS | Status: DC
  Administered 2018-08-30 – 2018-08-31 (×2): 10 [IU] via SUBCUTANEOUS
  Filled 2018-08-30 (×2): qty 0.1

## 2018-08-30 MED ORDER — BISACODYL 10 MG RE SUPP
10.0000 mg | Freq: Once | RECTAL | Status: AC
  Administered 2018-08-30: 10 mg via RECTAL
  Filled 2018-08-30: qty 1

## 2018-08-30 NOTE — Progress Notes (Signed)
NAME:  Holly Mueller, MRN:  [id number], DOB:  14-Aug-1965, LOS: 2 ADMISSION DATE:  08/27/2018, CONSULTATION DATE:  08/28/2018 REFERRING MD:  TRH MD, CHIEF COMPLAINT:  Hemoptysis In ILD with cirrhosis. COVID - negative    History of present illness   53 year old female with interstitial lung disease followed at the interstitial lung disease clinic by Dr. Chase Caller.  She has probable UIP on radiology associated with trace autoimmune positive in early 2019 (ANA 1: 80 and SSA greater than 8.0, M2Ab 181 +, and low C3 and C4)  with associated cirrhosis with chronic thrombocytopenia platelet 51,000.  She has had progressive ILD based on worsening on the CT scan between July 2018 and November 2019.  Her most recent pulmonary function test in March 2020 with FVC 1.87 L / 50% with a DLCO 15.56/69%.  In terms of exposure she has had exposure to mold in the past and exposed to secondhand smoke when last seen in the interstitial lung disease clinic the differential diagnosis was idiopathic pulmonary fibrosis versus NSIP versus interstitial lung disease with autoimmune features.  She met criteria for the surgical lung biopsy but given multiple medical issues and low platelets this was deferred.  She had a bronchoscopy with lavage on July 20, 2018.  The lavage was mixed cellularity with 28% lymphocytes, 54% neutrophils and 16% macrophage [normally greater than 95% is macrophages).  These results have not been shared with her because of the ongoing pandemic.  She now presents with 1 week history of shortness of breath with dry cough but without any chest pain fever or chills.  Also some runny nose.  And then 1 day prior to admission started having hemoptysis at least 7 times.  Nauseated and vomited once.  No diarrhea abdominal pain.  She denies any alcohol intake.  Critical care medicine has been asked to evaluate.  At this point in time she is stable on room air.  She is intermittent severe dry cough with retching and  she brings out streaky hemoptysis.  A COVID-19  is negative.  CT angiogram chest is negative for pulmonary embolism and the pulmonary fibrosis itself is stable compared to November 2019.  There is no report of any lung mass.  This CT scan was personally visualized.  Seasonal respiratory virus panel is pending at this time.  Of note a gastroenterology appointment was set up but this also has not happened because of the ongoing pandemic   Past Medical History    has a past medical history of Arthritis, Diabetes mellitus without complication ([place]), Edentulous, Epilepsy ([place]) (in 1972), Fever (06/2017), GERD (gastroesophageal reflux disease) (2008), Headache(784.0), Hypothyroidism (2008), Lower esophageal ring (08/18/2013), Seasonal allergies (2003), and Shortness of breath.   Significant Hospital Events   08/27/2018 - admit 4/19 - ccm consult COVID-19 negative CTA chest 4/19 > Respiratory motion artifact and suboptimal opacification of the pulmonary arteries. No central or proximal segmental pulmonary embolus. No acute pulmonary process. Findings of pulmonary fibrosis are stable from prior high-resolution chest CT. LE doppler 4/19 > There is no evidence of deep vein thrombosis in the lower extremites. No cystic structure found in the popliteal fossa.  INTERVAL HISTORY   Not coughing up blood any more Does c/o abd pain and constipation  Objective   Blood pressure 132/74, pulse 90, temperature 98.6 F (37 C), temperature source Oral, resp. rate 17, height _0  (1.676 m), weight 110.2 kg, SpO2 95 %.        Intake/Output Summary (Last 24  hours) at 08/30/2018 0948 Last data filed at 08/29/2018 2200 Gross per 24 hour  Intake 860 ml  Output 220 ml  Net 640 ml   Filed Weights   08/27/18 2349  Weight: 110.2 kg    Examination:  General this is a chronically ill appearing wf. Resting in bed. Not in acute distress HENT: NCAT no JVD Pulm clear decreased bases Card RRR abd not tender to  palp but c/o discomfort. + bowel sounds gu voids Neuro intact Ext warm and dry    Assessment & Plan:  Hemoptysis -  COVID-19 negative. This fits involved with acute bronchitis not otherwise specified with underlying pulmonary fibrosis and mild thrombocytopenia should probably benefit from bronchodilatation and also steroids. Alternatively, might be flare up of autoimmune/vasculitis  -clinically:  Plan: Cont pulse ox Cough suppression  Slow steroid taper  F/u pending auto-immune labs    ILD - Based on mixed cellularity this is likely UIP.  She has some lymphocytosis but it is not exaggerated above 40% thus making sarcoidosis or hypersensitive pneumonitis unlikely.  Given the presence of several autoimmune bodies and female gender  this is very likely interstitial lung disease with autoimmune features of idiopathic variety [I PAF with likely pathologic UIP.  There is some element of progression between 2018 and 2020 but overall this is only mild.   Plan: Address possible anti-fibrotics as out pt    Cirrhosis Plan Will need to re-establish w/ GI   Erick Colace ACNP-BC [place] Pager # [phone] OR # [phone] if no answer  08/30/2018 9:48 AM

## 2018-08-30 NOTE — Progress Notes (Signed)
Inpatient Diabetes Program Recommendations  AACE/ADA: New Consensus Statement on Inpatient Glycemic Control (2015)  Target Ranges:  Prepandial:   less than 140 mg/dL      Peak postprandial:   less than 180 mg/dL (1-2 hours)      Critically ill patients:  140 - 180 mg/dL   Lab Results  Component Value Date   GLUCAP 388 (H) 08/30/2018   HGBA1C 6.2 07/26/2018    Review of Glycemic Control  Blood sugars > 300 mg/dL On Solumedrol 40 Q8H. Needs insulin adjusment.  Inpatient Diabetes Program Recommendations:     Increase Lantus to 18 units QD. Add Novolog 3 units tidwc for meal coverage insulin if pt eats > 50% meal.  Will continue to follow.  Thank you. Lorenda Peck, RD, LDN, CDE Inpatient Diabetes Coordinator [phone]

## 2018-08-30 NOTE — Progress Notes (Signed)
PROGRESS NOTE    MAKAYLEN THIEME  DUK:[id number] DOB: [birth year] DOA: 08/27/2018 PCP: [name], MD   Brief Narrative: Holly Mueller is a 53 y.o. female with medical history significant of interstitial lung disease, diabetes mellitus, GERD, hypothyroidism, liver cirrhosis without ascites, pancytopenia. She presented secondary to hemoptysis in setting of ILD and acute bronchitis. She is being treated with nebulizer treatments and IV steroids. Monitoring CBC.   Assessment & Plan:   Principal Problem:   Hemoptysis Active Problems:   Hypothyroidism   GERD (gastroesophageal reflux disease)   Cirrhosis of liver without ascites (HCC)   Type 2 diabetes mellitus without complication, without long-term current use of insulin (HCC)   ILD (interstitial lung disease) ([place])   Pancytopenia (HCC)   Hemoptysis Recurrent yesterday. Hemoglobin continues to drop slowly. Hemoglobin  -PCCM recommendations  ILD Acute bronchitis PCCM on board. Concern for UIP. Treating for bronchitis with Solu-medrol and nebulizer treatments. Low C3, C4 -Continue Solu-medrol; transition to PO pending PCCM recommendations: taper steroids -PCCM recommendations -Continue bronchodilators -Mucinex and Tussenex prn -ANCA, ANA, anti GBM, DS DNA pending  Hypothyroidism -Continue Synthroid  GERD -Continue Protonix  Cirrhosis with ascites Some abdominal pain today, stable from yesterday.  Abdominal pain Unknown etiology. Controlled with analgesics. Loose stool this morning. Stool seen in rectum on x-ray -Dulcolax suppository today -Monitor for now  Diabetes mellitus, type 2 Worsened control in setting of steroids. Fasting blood sugar of 339 today -Continue SSI, change to resistant scale  Pancytopenia Chronic. Worsened platelets. WBC improved in setting of steroids.   DVT prophylaxis: SCDs Code Status:   Code Status: Full Code Family Communication: None at bedside Disposition Plan: Discharge  pending stabilization of hemoglobin, improvement of hemoptysis and PCCM recommendations   Consultants:   PCCM  Procedures:   None  Antimicrobials:  None    Subjective: No hemoptysis overnight.   Objective: Vitals:   08/29/18 1953 08/29/18 2155 08/30/18 0540 08/30/18 0804  BP:  118/72 132/74   Pulse:  100 90   Resp:  17 17   Temp:  98.5 F (36.9 C) 98.6 F (37 C)   TempSrc:  Oral Oral   SpO2: 93% 94% 95% 95%  Weight:      Height:        Intake/Output Summary (Last 24 hours) at 08/30/2018 0859 Last data filed at 08/29/2018 2200 Gross per 24 hour  Intake 860 ml  Output 220 ml  Net 640 ml   Filed Weights   08/27/18 2349  Weight: 110.2 kg    Examination:  General exam: Appears calm and comfortable Respiratory system: Wheezing, diminished. Respiratory effort normal. Cardiovascular system: S1 & S2 heard, RRR. 2/6 systolic murmur. Gastrointestinal system: Abdomen is nondistended, soft and nontender. No organomegaly or masses felt. Normal bowel sounds heard. Central nervous system: Alert and oriented. No focal neurological deficits. Extremities: No edema. No calf tenderness Skin: No cyanosis. No rashes Psychiatry: Judgement and insight appear normal. Mood & affect appropriate.     Data Reviewed: I have personally reviewed following labs and imaging studies  CBC: Recent Labs  Lab 08/27/18 2345  08/29/18 0951 08/29/18 1431 08/29/18 2052 08/30/18 0239 08/30/18 0837  WBC 2.1*   < > 4.9 5.7 5.4 5.9 6.6  NEUTROABS 1.3*  --   --   --   --   --   --   HGB 10.7*   < > 8.8* 8.6* 8.4* 8.3* 8.4*  HCT 32.1*   < > 27.6* 25.9* 25.8* 24.9* 24.8*  MCV 89.2   < > 89.6 88.1 87.8 87.7 87.0  PLT 76*   < > 101* 97* 90* 88* 95*   < > = values in this interval not displayed.   Basic Metabolic Panel: Recent Labs  Lab 08/27/18 2345 08/28/18 0646  NA 134* 134*  K 3.8 4.2  CL 109 107  CO2 19* 21*  GLUCOSE 293* 289*  BUN 13 18  CREATININE 0.61 0.63  CALCIUM 7.9* 8.1*    GFR: Estimated Creatinine Clearance: 102.3 mL/min (by C-G formula based on SCr of 0.63 mg/dL). Liver Function Tests: Recent Labs  Lab 08/27/18 2345  AST 62*  ALT 42  ALKPHOS 168*  BILITOT 1.9*  PROT 7.1  ALBUMIN 2.7*   No results for input(s): LIPASE, AMYLASE in the last 168 hours. No results for input(s): AMMONIA in the last 168 hours. Coagulation Profile: Recent Labs  Lab 08/27/18 2345  INR 1.4*   Cardiac Enzymes: No results for input(s): CKTOTAL, CKMB, CKMBINDEX, TROPONINI in the last 168 hours. BNP (last 3 results) No results for input(s): PROBNP in the last 8760 hours. HbA1C: No results for input(s): HGBA1C in the last 72 hours. CBG: Recent Labs  Lab 08/29/18 0735 08/29/18 1220 08/29/18 1658 08/29/18 2150 08/30/18 0745  GLUCAP 339* 374* 318* 356* 398*   Lipid Profile: Recent Labs    08/27/18 2348  TRIG 81   Thyroid Function Tests: No results for input(s): TSH, T4TOTAL, FREET4, T3FREE, THYROIDAB in the last 72 hours. Anemia Panel: Recent Labs    08/27/18 2348 08/29/18 0523  VITAMINB12  --  703  FOLATE  --  16.6  FERRITIN 12 28  TIBC  --  392  IRON  --  334*  RETICCTPCT  --  5.5*   Sepsis Labs: Recent Labs  Lab 08/27/18 [place] <0.10    Recent Results (from the past 240 hour(s))  SARS Coronavirus 2 [facility] order, Performed in [place] hospital lab)     Status: None   Collection Time: 08/27/18 11:48 PM  Result Value Ref Range Status   SARS Coronavirus 2 NEGATIVE NEGATIVE Final    Comment: (NOTE) If result is NEGATIVE SARS-CoV-2 target nucleic acids are NOT DETECTED. The SARS-CoV-2 RNA is generally detectable in upper and lower  respiratory specimens during the acute phase of infection. The lowest  concentration of SARS-CoV-2 viral copies this assay can detect is 250  copies / mL. A negative result does not preclude SARS-CoV-2 infection  and should not be used as the sole basis for treatment or other  patient management  decisions.  A negative result may occur with  improper specimen collection / handling, submission of specimen other  than nasopharyngeal swab, presence of viral mutation(s) within the  areas targeted by this assay, and inadequate number of viral copies  (<250 copies / mL). A negative result must be combined with clinical  observations, patient history, and epidemiological information. If result is POSITIVE SARS-CoV-2 target nucleic acids are DETECTED. The SARS-CoV-2 RNA is generally detectable in upper and lower  respiratory specimens dur ing the acute phase of infection.  Positive  results are indicative of active infection with SARS-CoV-2.  Clinical  correlation with patient history and other diagnostic information is  necessary to determine patient infection status.  Positive results do  not rule out bacterial infection or co-infection with other viruses. If result is PRESUMPTIVE POSTIVE SARS-CoV-2 nucleic acids MAY BE PRESENT.   A presumptive positive result was obtained on the submitted specimen  and confirmed on repeat testing.  While 2019 novel coronavirus  (SARS-CoV-2) nucleic acids may be present in the submitted sample  additional confirmatory testing may be necessary for epidemiological  and / or clinical management purposes  to differentiate between  SARS-CoV-2 and other Sarbecovirus currently known to infect humans.  If clinically indicated additional testing with an alternate test  methodology [phone]) is advised. The SARS-CoV-2 RNA is generally  detectable in upper and lower respiratory sp ecimens during the acute  phase of infection. The expected result is Negative. Fact Sheet for Patients:  StrictlyIdeas.no Fact Sheet for Healthcare Providers: BankingDealers.co.za This test is not yet approved or cleared by the [place] FDA and has been authorized for detection and/or diagnosis of SARS-CoV-2 by FDA under an  Emergency Use Authorization (EUA).  This EUA will remain in effect (meaning this test can be used) for the duration of the COVID-19 declaration under Section 564(b)(1) of the Act, 21 U.S.C. section 360bbb-3(b)(1), unless the authorization is terminated or revoked sooner. Performed at [facility], [place] [address] [place], [place] [address]   Respiratory Panel by PCR     Status: None   Collection Time: 08/28/18  3:35 AM  Result Value Ref Range Status   Adenovirus NOT DETECTED NOT DETECTED Final   Coronavirus 229E NOT DETECTED NOT DETECTED Final    Comment: (NOTE) The Coronavirus on the Respiratory Panel, DOES NOT test for the novel  Coronavirus (2019 nCoV)    Coronavirus HKU1 NOT DETECTED NOT DETECTED Final   Coronavirus NL63 NOT DETECTED NOT DETECTED Final   Coronavirus OC43 NOT DETECTED NOT DETECTED Final   Metapneumovirus NOT DETECTED NOT DETECTED Final   Rhinovirus / Enterovirus NOT DETECTED NOT DETECTED Final   Influenza A NOT DETECTED NOT DETECTED Final   Influenza B NOT DETECTED NOT DETECTED Final   Parainfluenza Virus 1 NOT DETECTED NOT DETECTED Final   Parainfluenza Virus 2 NOT DETECTED NOT DETECTED Final   Parainfluenza Virus 3 NOT DETECTED NOT DETECTED Final   Parainfluenza Virus 4 NOT DETECTED NOT DETECTED Final   Respiratory Syncytial Virus NOT DETECTED NOT DETECTED Final   Bordetella pertussis NOT DETECTED NOT DETECTED Final   Chlamydophila pneumoniae NOT DETECTED NOT DETECTED Final   Mycoplasma pneumoniae NOT DETECTED NOT DETECTED Final    Comment: Performed at [facility], [address], [place], [place] [address]  MRSA PCR Screening     Status: None   Collection Time: 08/28/18  4:45 AM  Result Value Ref Range Status   MRSA by PCR NEGATIVE NEGATIVE Final    Comment:        The GeneXpert MRSA Assay (FDA approved for NASAL specimens only), is one component of a comprehensive MRSA colonization surveillance program. It is not intended  to diagnose MRSA infection nor to guide or monitor treatment for MRSA infections. Performed at [facility], [place] [address] [place], [place] [address]          Radiology Studies: Dg Abd Portable 1v  Result Date: 08/29/2018 CLINICAL DATA:  Diffuse abd pain and vomiting EXAM: PORTABLE ABDOMEN - 1 VIEW COMPARISON:  CT abdomen 05/26/2012 FINDINGS: No dilated loops of large or small bowel. Gas and stool in the rectum. No pathologic calcifications. No organomegaly. No acute osseous abnormality IMPRESSION: No acute abdominal findings. Electronically Signed   By: Suzy Bouchard M.D.   On: 08/29/2018 11:51   Vas [place] Lower Extremity Venous (dvt)  Result Date: 08/28/2018  Lower Venous Study  Indications: SOB.  Risk Factors: Obesity. Performing Technologist: Toma Copier RVS  Examination Guidelines: A complete evaluation includes B-mode imaging, spectral Doppler, color Doppler, and power Doppler as needed of all accessible portions of each vessel. Bilateral testing is considered an integral part of a complete examination. Limited examinations for reoccurring indications may be performed as noted.  +---------+---------------+---------+-----------+----------+------------------+  RIGHT     Compressibility Phasicity Spontaneity Properties Summary             +---------+---------------+---------+-----------+----------+------------------+  CFV       Full            Yes       Yes                                        +---------+---------------+---------+-----------+----------+------------------+  SFJ       Full                                                                 +---------+---------------+---------+-----------+----------+------------------+  FV Prox   Full            Yes       Yes                                        +---------+---------------+---------+-----------+----------+------------------+  FV Mid    Full                                                                  +---------+---------------+---------+-----------+----------+------------------+  FV Distal Full            Yes       Yes                                        +---------+---------------+---------+-----------+----------+------------------+  PFV       Full            Yes       Yes                                        +---------+---------------+---------+-----------+----------+------------------+  POP       Full            Yes       Yes                                        +---------+---------------+---------+-----------+----------+------------------+  PTV       Full                                                                 +---------+---------------+---------+-----------+----------+------------------+  PERO      Full                                             Difficult to image  +---------+---------------+---------+-----------+----------+------------------+   +---------+---------------+---------+-----------+----------+------------------+  LEFT      Compressibility Phasicity Spontaneity Properties Summary             +---------+---------------+---------+-----------+----------+------------------+  CFV       Full            Yes       Yes                                        +---------+---------------+---------+-----------+----------+------------------+  SFJ       Full                                                                 +---------+---------------+---------+-----------+----------+------------------+  FV Prox   Full            Yes       Yes                                        +---------+---------------+---------+-----------+----------+------------------+  FV Mid    Full                                                                 +---------+---------------+---------+-----------+----------+------------------+  FV Distal Full            Yes       Yes                                        +---------+---------------+---------+-----------+----------+------------------+  PFV       Full             Yes       Yes                                        +---------+---------------+---------+-----------+----------+------------------+  POP       Full            Yes       Yes                                        +---------+---------------+---------+-----------+----------+------------------+  PTV       Full  Difficult to image  +---------+---------------+---------+-----------+----------+------------------+  PERO      Full                                             Difficult to image  +---------+---------------+---------+-----------+----------+------------------+     Summary: Right: There is no evidence of deep vein thrombosis in the lower extremity. No cystic structure found in the popliteal fossa. Left: There is no evidence of deep vein thrombosis in the lower extremity. No cystic structure found in the popliteal fossa.  *See table(s) above for measurements and observations. Electronically signed by Servando Snare MD on 08/28/2018 at 12:35:40 PM.    Final         Scheduled Meds:  budesonide (PULMICORT) nebulizer solution  0.5 mg Nebulization BID   fluticasone  2 spray Each Nare Daily   insulin aspart  0-20 Units Subcutaneous TID WC   insulin aspart  0-5 Units Subcutaneous QHS   ipratropium-albuterol  3 mL Nebulization Q6H   levothyroxine  250 mcg Oral Daily   loratadine  10 mg Oral Daily   methylPREDNISolone (SOLU-MEDROL) injection  40 mg Intravenous Q8H   pantoprazole  40 mg Oral BID AC   Continuous Infusions:   LOS: 2 days     Cordelia Poche, MD [facility] 08/30/2018, 8:59 AM  If 7PM-7AM, please contact night-coverage [email or web address]

## 2018-08-31 LAB — GLUCOSE, CAPILLARY
Glucose-Capillary: 364 mg/dL — ABNORMAL HIGH (ref 70–99)
Glucose-Capillary: 380 mg/dL — ABNORMAL HIGH (ref 70–99)
Glucose-Capillary: 313 mg/dL — ABNORMAL HIGH (ref 70–99)
Glucose-Capillary: 314 mg/dL — ABNORMAL HIGH (ref 70–99)
Glucose-Capillary: 431 mg/dL — ABNORMAL HIGH (ref 70–99)

## 2018-08-31 LAB — BASIC METABOLIC PANEL
Anion gap: 10 (ref 5–15)
BUN: 20 mg/dL (ref 6–20)
CO2: 20 mmol/L — ABNORMAL LOW (ref 22–32)
Calcium: 8.4 mg/dL — ABNORMAL LOW (ref 8.9–10.3)
Chloride: 99 mmol/L (ref 98–111)
Creatinine, Ser: 0.69 mg/dL (ref 0.44–1.00)
GFR calc Af Amer: >60 mL/min (ref >60)
GFR calc non Af Amer: >60 mL/min (ref >60)
Glucose, Bld: 405 mg/dL — ABNORMAL HIGH (ref 70–99)
Potassium: 4.3 mmol/L (ref 3.5–5.1)
Sodium: 129 mmol/L — ABNORMAL LOW (ref 135–145)

## 2018-08-31 LAB — CBC
HCT: 24.1 % — ABNORMAL LOW (ref 36.0–46.0)
Hemoglobin: 7.7 g/dL — ABNORMAL LOW (ref 12.0–15.0)
MCH: 28.2 pg (ref 26.0–34.0)
MCHC: 32 g/dL (ref 30.0–36.0)
MCV: 88.3 fL (ref 80.0–100.0)
Platelets: 83 10*3/uL — ABNORMAL LOW (ref 150–400)
RBC: 2.73 MIL/uL — ABNORMAL LOW (ref 3.87–5.11)
RDW: 14.5 % (ref 11.5–15.5)
WBC: 4.8 10*3/uL (ref 4.0–10.5)
nRBC: 0.8 % — ABNORMAL HIGH (ref 0.0–0.2)

## 2018-08-31 LAB — GLOMERULAR BASEMENT MEMBRANE ANTIBODIES: GBM Ab: 6 units (ref 0–20)

## 2018-08-31 LAB — GLUCOSE, RANDOM: Glucose, Bld: 413 mg/dL — ABNORMAL HIGH (ref 70–99)

## 2018-08-31 MED ORDER — INSULIN GLARGINE 100 UNIT/ML ~~LOC~~ SOLN
18.0000 [IU] | Freq: Every day | SUBCUTANEOUS | Status: DC
  Administered 2018-09-01: 18 [IU] via SUBCUTANEOUS
  Filled 2018-08-31: qty 0.18

## 2018-08-31 MED ORDER — IPRATROPIUM-ALBUTEROL 0.5-2.5 (3) MG/3ML IN SOLN
3.0000 mL | Freq: Two times a day (BID) | RESPIRATORY_TRACT | Status: DC
  Administered 2018-09-01 – 2018-09-02 (×3): 3 mL via RESPIRATORY_TRACT
  Filled 2018-08-31 (×3): qty 3

## 2018-08-31 MED ORDER — INSULIN ASPART 100 UNIT/ML ~~LOC~~ SOLN
3.0000 [IU] | Freq: Three times a day (TID) | SUBCUTANEOUS | Status: DC
  Administered 2018-08-31 – 2018-09-01 (×3): 3 [IU] via SUBCUTANEOUS

## 2018-08-31 MED ORDER — INSULIN ASPART 100 UNIT/ML ~~LOC~~ SOLN
7.0000 [IU] | Freq: Once | SUBCUTANEOUS | Status: AC
  Administered 2018-08-31: 7 [IU] via SUBCUTANEOUS

## 2018-08-31 NOTE — Telephone Encounter (Signed)
Per patient's chart, she is currently in the hospital. ATC but no one answered and I could not leave a VM. Will keep this encounter open until she is discharged and perhaps we can speak with her.

## 2018-08-31 NOTE — Progress Notes (Signed)
PROGRESS NOTE    Holly Mueller  CBJ:[id number] DOB: 1966-02-23 DOA: 08/27/2018 PCP: [name], MD     Brief Narrative:  Holly Mueller is a 53 y.o. femalewith medical history significant ofinterstitial lung disease, diabetes mellitus, GERD, hypothyroidism, liver cirrhosis without ascites, pancytopenia. She presented secondary to hemoptysis in setting of ILD and acute bronchitis.   New events last 24 hours / Subjective: Complaining of sore throat and a dry cough.  No further hemoptysis.  She also reports that her abdominal pain has improved.  She remains on room air.  Assessment & Plan:   Principal Problem:   Hemoptysis Active Problems:   Hypothyroidism   GERD (gastroesophageal reflux disease)   Cirrhosis of liver without ascites (HCC)   Type 2 diabetes mellitus without complication, without long-term current use of insulin (HCC)   ILD (interstitial lung disease) ([place])   Pancytopenia (HCC)   Hemoptysis -No further episodes of hemoptysis yesterday.  SARS-CoV-2 negative, RVP negative. PCCM following  ILD, likely UIP  -Appreciate PCCM -Continue Solu-Medrol, bronchodilator  Diabetes mellitus, type 2 -Hyperglycemia in setting of IV steroid use. Increase Lantus and added mealtime NovoLog coverage in addition to sliding scale coverage  Hypothyroidism -Continue Synthroid  Cirrhosis with ascites, and pancytopenia -Follow-up with GI as an outpatient -Platelets remain stable  Abdominal pain -Improved   DVT prophylaxis: SCD Code Status: Full code Family Communication: None Disposition Plan: Pending further PCCM recommendations, blood sugar improvement   Consultants:   PCCM  Procedures:   None  Antimicrobials:  Anti-infectives (From admission, onward)   None        Objective: Vitals:   08/30/18 2018 08/30/18 2043 08/31/18 0459 08/31/18 0755  BP:  (!) 142/76 111/63   Pulse:  86 79   Resp:  18 16   Temp:  98 F (36.7 C) (!) 97.5 F (36.4  C)   TempSrc:      SpO2: 95% 93% 93% 94%  Weight:      Height:        Intake/Output Summary (Last 24 hours) at 08/31/2018 1029 Last data filed at 08/30/2018 1851 Gross per 24 hour  Intake 720 ml  Output -  Net 720 ml   Filed Weights   08/27/18 2349  Weight: 110.2 kg    Examination:  General exam: Appears calm and comfortable  Respiratory system: Clear to auscultation. Respiratory effort normal. On room air, dry coughing spells during exam  Cardiovascular system: S1 & S2 heard, RRR. No JVD, murmurs, rubs, gallops or clicks. No pedal edema. Gastrointestinal system: Abdomen is nondistended, soft and nontender. No organomegaly or masses felt. Normal bowel sounds heard. Central nervous system: Alert and oriented. No focal neurological deficits. Extremities: Symmetric 5 x 5 power. Skin: No rashes, lesions or ulcers Psychiatry: Judgement and insight appear normal. Mood & affect appropriate.   Data Reviewed: I have personally reviewed following labs and imaging studies  CBC: Recent Labs  Lab 08/27/18 2345  08/29/18 2052 08/30/18 0239 08/30/18 0837 08/30/18 1508 08/31/18 0656  WBC 2.1*   < > 5.4 5.9 6.6 5.8 4.8  NEUTROABS 1.3*  --   --   --   --   --   --   HGB 10.7*   < > 8.4* 8.3* 8.4* 7.9* 7.7*  HCT 32.1*   < > 25.8* 24.9* 24.8* 24.0* 24.1*  MCV 89.2   < > 87.8 87.7 87.0 87.6 88.3  PLT 76*   < > 90* 88* 95* 89* 83*   < > =  values in this interval not displayed.   Basic Metabolic Panel: Recent Labs  Lab 08/27/18 2345 08/28/18 0646 08/31/18 0656  NA 134* 134* 129*  K 3.8 4.2 4.3  CL 109 107 99  CO2 19* 21* 20*  GLUCOSE 293* 289* 405*  BUN _0 CREATININE 0.61 0.63 0.69  CALCIUM 7.9* 8.1* 8.4*   GFR: Estimated Creatinine Clearance: 102.3 mL/min (by C-G formula based on SCr of 0.69 mg/dL). Liver Function Tests: Recent Labs  Lab 08/27/18 2345  AST 62*  ALT 42  ALKPHOS 168*  BILITOT 1.9*  PROT 7.1  ALBUMIN 2.7*   No results for input(s): LIPASE,  AMYLASE in the last 168 hours. No results for input(s): AMMONIA in the last 168 hours. Coagulation Profile: Recent Labs  Lab 08/27/18 2345  INR 1.4*   Cardiac Enzymes: No results for input(s): CKTOTAL, CKMB, CKMBINDEX, TROPONINI in the last 168 hours. BNP (last 3 results) No results for input(s): PROBNP in the last 8760 hours. HbA1C: No results for input(s): HGBA1C in the last 72 hours. CBG: Recent Labs  Lab 08/30/18 0745 08/30/18 1154 08/30/18 1831 08/30/18 2045 08/31/18 0741  GLUCAP 398* 388* 365* 336* 364*   Lipid Profile: No results for input(s): CHOL, HDL, LDLCALC, TRIG, CHOLHDL, LDLDIRECT in the last 72 hours. Thyroid Function Tests: No results for input(s): TSH, T4TOTAL, FREET4, T3FREE, THYROIDAB in the last 72 hours. Anemia Panel: Recent Labs    08/29/18 0523  VITAMINB12 703  FOLATE 16.6  FERRITIN 28  TIBC 392  IRON 334*  RETICCTPCT 5.5*   Sepsis Labs: Recent Labs  Lab 08/27/18 2345  PROCALCITON <0.10    Recent Results (from the past 240 hour(s))  SARS Coronavirus 2 [facility] order, Performed in [place] hospital lab)     Status: None   Collection Time: 08/27/18 11:48 PM  Result Value Ref Range Status   SARS Coronavirus 2 NEGATIVE NEGATIVE Final    Comment: (NOTE) If result is NEGATIVE SARS-CoV-2 target nucleic acids are NOT DETECTED. The SARS-CoV-2 RNA is generally detectable in upper and lower  respiratory specimens during the acute phase of infection. The lowest  concentration of SARS-CoV-2 viral copies this assay can detect is 250  copies / mL. A negative result does not preclude SARS-CoV-2 infection  and should not be used as the sole basis for treatment or other  patient management decisions.  A negative result may occur with  improper specimen collection / handling, submission of specimen other  than nasopharyngeal swab, presence of viral mutation(s) within the  areas targeted by this assay, and inadequate number of viral copies  (<250  copies / mL). A negative result must be combined with clinical  observations, patient history, and epidemiological information. If result is POSITIVE SARS-CoV-2 target nucleic acids are DETECTED. The SARS-CoV-2 RNA is generally detectable in upper and lower  respiratory specimens dur ing the acute phase of infection.  Positive  results are indicative of active infection with SARS-CoV-2.  Clinical  correlation with patient history and other diagnostic information is  necessary to determine patient infection status.  Positive results do  not rule out bacterial infection or co-infection with other viruses. If result is PRESUMPTIVE POSTIVE SARS-CoV-2 nucleic acids MAY BE PRESENT.   A presumptive positive result was obtained on the submitted specimen  and confirmed on repeat testing.  While 2019 novel coronavirus  (SARS-CoV-2) nucleic acids may be present in the submitted sample  additional confirmatory testing may be necessary for epidemiological  and /  or clinical management purposes  to differentiate between  SARS-CoV-2 and other Sarbecovirus currently known to infect humans.  If clinically indicated additional testing with an alternate test  methodology [phone]) is advised. The SARS-CoV-2 RNA is generally  detectable in upper and lower respiratory sp ecimens during the acute  phase of infection. The expected result is Negative. Fact Sheet for Patients:  StrictlyIdeas.no Fact Sheet for Healthcare Providers: BankingDealers.co.za This test is not yet approved or cleared by the [place] FDA and has been authorized for detection and/or diagnosis of SARS-CoV-2 by FDA under an Emergency Use Authorization (EUA).  This EUA will remain in effect (meaning this test can be used) for the duration of the COVID-19 declaration under Section 564(b)(1) of the Act, 21 U.S.C. section 360bbb-3(b)(1), unless the authorization is terminated or revoked  sooner. Performed at [facility], [place] [address] [place], [place] [address]   Respiratory Panel by PCR     Status: None   Collection Time: 08/28/18  3:35 AM  Result Value Ref Range Status   Adenovirus NOT DETECTED NOT DETECTED Final   Coronavirus 229E NOT DETECTED NOT DETECTED Final    Comment: (NOTE) The Coronavirus on the Respiratory Panel, DOES NOT test for the novel  Coronavirus (2019 nCoV)    Coronavirus HKU1 NOT DETECTED NOT DETECTED Final   Coronavirus NL63 NOT DETECTED NOT DETECTED Final   Coronavirus OC43 NOT DETECTED NOT DETECTED Final   Metapneumovirus NOT DETECTED NOT DETECTED Final   Rhinovirus / Enterovirus NOT DETECTED NOT DETECTED Final   Influenza A NOT DETECTED NOT DETECTED Final   Influenza B NOT DETECTED NOT DETECTED Final   Parainfluenza Virus 1 NOT DETECTED NOT DETECTED Final   Parainfluenza Virus 2 NOT DETECTED NOT DETECTED Final   Parainfluenza Virus 3 NOT DETECTED NOT DETECTED Final   Parainfluenza Virus 4 NOT DETECTED NOT DETECTED Final   Respiratory Syncytial Virus NOT DETECTED NOT DETECTED Final   Bordetella pertussis NOT DETECTED NOT DETECTED Final   Chlamydophila pneumoniae NOT DETECTED NOT DETECTED Final   Mycoplasma pneumoniae NOT DETECTED NOT DETECTED Final    Comment: Performed at [facility], [address] [place], [place] [address]  MRSA PCR Screening     Status: None   Collection Time: 08/28/18  4:45 AM  Result Value Ref Range Status   MRSA by PCR NEGATIVE NEGATIVE Final    Comment:        The GeneXpert MRSA Assay (FDA approved for NASAL specimens only), is one component of a comprehensive MRSA colonization surveillance program. It is not intended to diagnose MRSA infection nor to guide or monitor treatment for MRSA infections. Performed at [facility] [place] [address], [place], [place] [address]        Radiology Studies: Dg Abd Portable 1v  Result Date: 08/29/2018 CLINICAL DATA:   Diffuse abd pain and vomiting EXAM: PORTABLE ABDOMEN - 1 VIEW COMPARISON:  CT abdomen 05/26/2012 FINDINGS: No dilated loops of large or small bowel. Gas and stool in the rectum. No pathologic calcifications. No organomegaly. No acute osseous abnormality IMPRESSION: No acute abdominal findings. Electronically Signed   By: Suzy Bouchard M.D.   On: 08/29/2018 11:51      Scheduled Meds: . budesonide (PULMICORT) nebulizer solution  0.5 mg Nebulization BID  . fluticasone  2 spray Each Nare Daily  . insulin aspart  0-20 Units Subcutaneous TID WC  . insulin aspart  0-5 Units Subcutaneous QHS  . insulin aspart  3  Units Subcutaneous TID WC  . [START ON 09/01/2018] insulin glargine  18 Units Subcutaneous Daily  . ipratropium-albuterol  3 mL Nebulization Q6H WA  . levothyroxine  250 mcg Oral Daily  . loratadine  10 mg Oral Daily  . methylPREDNISolone (SOLU-MEDROL) injection  40 mg Intravenous Q8H  . pantoprazole  40 mg Oral BID AC   Continuous Infusions:   LOS: 3 days    Time spent: 40 minutes   Dessa Phi, DO [facility] [email or web address] 08/31/2018, 10:29 AM

## 2018-09-01 LAB — GLUCOSE, CAPILLARY
Glucose-Capillary: 334 mg/dL — ABNORMAL HIGH (ref 70–99)
Glucose-Capillary: 295 mg/dL — ABNORMAL HIGH (ref 70–99)
Glucose-Capillary: 390 mg/dL — ABNORMAL HIGH (ref 70–99)
Glucose-Capillary: 283 mg/dL — ABNORMAL HIGH (ref 70–99)
Glucose-Capillary: 329 mg/dL — ABNORMAL HIGH (ref 70–99)

## 2018-09-01 LAB — BASIC METABOLIC PANEL
Anion gap: 8 (ref 5–15)
BUN: 19 mg/dL (ref 6–20)
CO2: 23 mmol/L (ref 22–32)
Calcium: 8.4 mg/dL — ABNORMAL LOW (ref 8.9–10.3)
Chloride: 100 mmol/L (ref 98–111)
Creatinine, Ser: 0.61 mg/dL (ref 0.44–1.00)
GFR calc Af Amer: >60 mL/min (ref >60)
GFR calc non Af Amer: >60 mL/min (ref >60)
Glucose, Bld: 299 mg/dL — ABNORMAL HIGH (ref 70–99)
Potassium: 4.1 mmol/L (ref 3.5–5.1)
Sodium: 131 mmol/L — ABNORMAL LOW (ref 135–145)

## 2018-09-01 LAB — CBC
HCT: 24 % — ABNORMAL LOW (ref 36.0–46.0)
Hemoglobin: 8 g/dL — ABNORMAL LOW (ref 12.0–15.0)
MCH: 29.5 pg (ref 26.0–34.0)
MCHC: 33.3 g/dL (ref 30.0–36.0)
MCV: 88.6 fL (ref 80.0–100.0)
Platelets: 74 10*3/uL — ABNORMAL LOW (ref 150–400)
RBC: 2.71 MIL/uL — ABNORMAL LOW (ref 3.87–5.11)
RDW: 14.3 % (ref 11.5–15.5)
WBC: 3.3 10*3/uL — ABNORMAL LOW (ref 4.0–10.5)
nRBC: 1.8 % — ABNORMAL HIGH (ref 0.0–0.2)

## 2018-09-01 MED ORDER — INSULIN GLARGINE 100 UNIT/ML ~~LOC~~ SOLN
25.0000 [IU] | Freq: Every day | SUBCUTANEOUS | Status: DC
  Administered 2018-09-02: 10:00:00 25 [IU] via SUBCUTANEOUS
  Filled 2018-09-01: qty 0.25

## 2018-09-01 MED ORDER — INSULIN ASPART 100 UNIT/ML ~~LOC~~ SOLN
5.0000 [IU] | Freq: Three times a day (TID) | SUBCUTANEOUS | Status: DC
  Administered 2018-09-01 – 2018-09-02 (×5): 5 [IU] via SUBCUTANEOUS

## 2018-09-01 MED ORDER — INSULIN STARTER KIT- SYRINGES (ENGLISH)
1.0000 | Freq: Once | Status: AC
  Administered 2018-09-01: 1

## 2018-09-01 MED ORDER — PREDNISONE 50 MG PO TABS
50.0000 mg | ORAL_TABLET | Freq: Every day | ORAL | Status: DC
  Administered 2018-09-02: 50 mg via ORAL
  Filled 2018-09-01: qty 1

## 2018-09-01 MED ORDER — METHYLPREDNISOLONE SODIUM SUCC 40 MG IJ SOLR
40.0000 mg | Freq: Two times a day (BID) | INTRAMUSCULAR | Status: AC
  Administered 2018-09-01: 40 mg via INTRAVENOUS
  Filled 2018-09-01: qty 1

## 2018-09-01 NOTE — Telephone Encounter (Signed)
Pt is still currently admitted in hospital.

## 2018-09-01 NOTE — Progress Notes (Signed)
Inpatient Diabetes Program Recommendations  AACE/ADA: New Consensus Statement on Inpatient Glycemic Control (2015)  Target Ranges:  Prepandial:   less than 140 mg/dL      Peak postprandial:   less than 180 mg/dL (1-2 hours)      Critically ill patients:  140 - 180 mg/dL   Lab Results  Component Value Date   GLUCAP 283 (H) 09/01/2018   HGBA1C 6.2 07/26/2018    Review of Glycemic Control  CBGs 283-390 mg/dL. Both basal and bolus insulin titrated today.  Lantus 25 units QD, Novolog 5 units tidwc + 0-20 units tidwc and hs  Ordered insulin starter kit and bedside RN to begin teaching insulin administration.  Please use each patient interaction to provide diabetes education. Please review Living Well with Diabetes booklet with the patient, have patient watch patient education videos on diabetes, and instruct on insulin administration. Please allow patient to be actively engaged with diabetes management by allowing patient to check own glucose and self-administer insulin injections. Diabetes Coordinator will follow up with patient and reinforce diabetes education.  Thank you. Lorenda Peck, RD, LDN, CDE Inpatient Diabetes Coordinator [phone]

## 2018-09-01 NOTE — Progress Notes (Signed)
PROGRESS NOTE    Holly Mueller  ION:[id number] DOB: 1966-04-16 DOA: 08/27/2018 PCP: [name], MD     Brief Narrative:  Holly Mueller is a 53 y.o. femalewith medical history significant ofinterstitial lung disease, diabetes mellitus, GERD, hypothyroidism, liver cirrhosis without ascites, pancytopenia. She presented secondary to hemoptysis in setting of ILD and acute bronchitis.   New events last 24 hours / Subjective: No further hemoptysis.  Remains on room air.  Continues to have a dry cough.  Patient states that she lives at home with 2 roommates and her 2 adult sons who are 67 and 59 years old.  Roommate is still working, her younger son has not been compliant with stay at home order and has been "out of the house all the time."  Patient very concerned about contracting SARS-CoV-2 when she gets home since she shares a bedroom with her sons.   Assessment & Plan:   Principal Problem:   Hemoptysis Active Problems:   Hypothyroidism   GERD (gastroesophageal reflux disease)   Cirrhosis of liver without ascites (HCC)   Type 2 diabetes mellitus without complication, without long-term current use of insulin (HCC)   ILD (interstitial lung disease) ([place])   Pancytopenia (HCC)   Hemoptysis -No further episodes of hemoptysis.  SARS-CoV-2 negative, RVP negative.  ILD, likely UIP  -Appreciate PCCM -Continue Solu-Medrol, bronchodilator.  Continue to wean Solu-Medrol, plan to transition to oral prednisone tomorrow  Diabetes mellitus, type 2 -Hyperglycemia in setting of IV steroid use. Increase Lantus and added mealtime NovoLog coverage in addition to sliding scale coverage  Hypothyroidism -Continue Synthroid  Cirrhosis with ascites, and pancytopenia -Follow-up with GI as an outpatient -Platelets remain stable  Abdominal pain -Improved   DVT prophylaxis: SCD Code Status: Full code Family Communication: None Disposition Plan: Slow taper of steroids, increase  insulin dosing. Hopeful discharge home 4/24    Consultants:   PCCM  Procedures:   None  Antimicrobials:  Anti-infectives (From admission, onward)   None       Objective: Vitals:   08/31/18 2122 09/01/18 0452 09/01/18 0824 09/01/18 0830  BP: 127/78 114/78    Pulse: 91 78    Resp: 18 18    Temp: (!) 97.5 F (36.4 C) 98.7 F (37.1 C)    TempSrc: Oral Oral    SpO2: 98% 96% 95% 95%  Weight:      Height:        Intake/Output Summary (Last 24 hours) at 09/01/2018 1052 Last data filed at 09/01/2018 0830 Gross per 24 hour  Intake 1200 ml  Output -  Net 1200 ml   Filed Weights   08/27/18 2349  Weight: 110.2 kg    Examination: General exam: Appears calm and comfortable  Respiratory system: Bibasilar crackles, no respiratory distress, no conversational dyspnea, on room air Cardiovascular system: S1 & S2 heard, RRR. No JVD, murmurs, rubs, gallops or clicks. No pedal edema. Gastrointestinal system: Abdomen is nondistended, soft and nontender. No organomegaly or masses felt. Normal bowel sounds heard. Central nervous system: Alert and oriented. No focal neurological deficits. Extremities: Symmetric 5 x 5 power. Skin: No rashes, lesions or ulcers Psychiatry: Judgement and insight appear normal. Mood & affect appropriate.   Data Reviewed: I have personally reviewed following labs and imaging studies  CBC: Recent Labs  Lab 08/27/18 2345  08/30/18 0239 08/30/18 0837 08/30/18 1508 08/31/18 0656 09/01/18 0555  WBC 2.1*   < > 5.9 6.6 5.8 4.8 3.3*  NEUTROABS 1.3*  --   --   --   --   --   --  HGB 10.7*   < > 8.3* 8.4* 7.9* 7.7* 8.0*  HCT 32.1*   < > 24.9* 24.8* 24.0* 24.1* 24.0*  MCV 89.2   < > 87.7 87.0 87.6 88.3 88.6  PLT 76*   < > 88* 95* 89* 83* 74*   < > = values in this interval not displayed.   Basic Metabolic Panel: Recent Labs  Lab 08/27/18 2345 08/28/18 0646 08/31/18 0656 08/31/18 2221 09/01/18 0555  NA 134* 134* 129*  --  131*  K 3.8 4.2 4.3  --   4.1  CL 109 107 99  --  100  CO2 19* 21* 20*  --  23  GLUCOSE 293* 289* 405* 413* 299*  BUN _0 --  19  CREATININE 0.61 0.63 0.69  --  0.61  CALCIUM 7.9* 8.1* 8.4*  --  8.4*   GFR: Estimated Creatinine Clearance: 102.3 mL/min (by C-G formula based on SCr of 0.61 mg/dL). Liver Function Tests: Recent Labs  Lab 08/27/18 2345  AST 62*  ALT 42  ALKPHOS 168*  BILITOT 1.9*  PROT 7.1  ALBUMIN 2.7*   No results for input(s): LIPASE, AMYLASE in the last 168 hours. No results for input(s): AMMONIA in the last 168 hours. Coagulation Profile: Recent Labs  Lab 08/27/18 2345  INR 1.4*   Cardiac Enzymes: No results for input(s): CKTOTAL, CKMB, CKMBINDEX, TROPONINI in the last 168 hours. BNP (last 3 results) No results for input(s): PROBNP in the last 8760 hours. HbA1C: No results for input(s): HGBA1C in the last 72 hours. CBG: Recent Labs  Lab 08/31/18 1554 08/31/18 1734 08/31/18 2125 09/01/18 0145 09/01/18 0721  GLUCAP 313* 314* 431* 334* 295*   Lipid Profile: No results for input(s): CHOL, HDL, LDLCALC, TRIG, CHOLHDL, LDLDIRECT in the last 72 hours. Thyroid Function Tests: No results for input(s): TSH, T4TOTAL, FREET4, T3FREE, THYROIDAB in the last 72 hours. Anemia Panel: No results for input(s): VITAMINB12, FOLATE, FERRITIN, TIBC, IRON, RETICCTPCT in the last 72 hours. Sepsis Labs: Recent Labs  Lab 08/27/18 [place] <0.10    Recent Results (from the past 240 hour(s))  SARS Coronavirus 2 [facility] order, Performed in [facility] hospital lab)     Status: None   Collection Time: 08/27/18 11:48 PM  Result Value Ref Range Status   SARS Coronavirus 2 NEGATIVE NEGATIVE Final    Comment: (NOTE) If result is NEGATIVE SARS-CoV-2 target nucleic acids are NOT DETECTED. The SARS-CoV-2 RNA is generally detectable in upper and lower  respiratory specimens during the acute phase of infection. The lowest  concentration of SARS-CoV-2 viral copies this assay can  detect is 250  copies / mL. A negative result does not preclude SARS-CoV-2 infection  and should not be used as the sole basis for treatment or other  patient management decisions.  A negative result may occur with  improper specimen collection / handling, submission of specimen other  than nasopharyngeal swab, presence of viral mutation(s) within the  areas targeted by this assay, and inadequate number of viral copies  (<250 copies / mL). A negative result must be combined with clinical  observations, patient history, and epidemiological information. If result is POSITIVE SARS-CoV-2 target nucleic acids are DETECTED. The SARS-CoV-2 RNA is generally detectable in upper and lower  respiratory specimens dur ing the acute phase of infection.  Positive  results are indicative of active infection with SARS-CoV-2.  Clinical  correlation with patient history and other diagnostic information is  necessary to determine patient  infection status.  Positive results do  not rule out bacterial infection or co-infection with other viruses. If result is PRESUMPTIVE POSTIVE SARS-CoV-2 nucleic acids MAY BE PRESENT.   A presumptive positive result was obtained on the submitted specimen  and confirmed on repeat testing.  While 2019 novel coronavirus  (SARS-CoV-2) nucleic acids may be present in the submitted sample  additional confirmatory testing may be necessary for epidemiological  and / or clinical management purposes  to differentiate between  SARS-CoV-2 and other Sarbecovirus currently known to infect humans.  If clinically indicated additional testing with an alternate test  methodology [id number] is advised. The SARS-CoV-2 RNA is generally  detectable in upper and lower respiratory sp ecimens during the acute  phase of infection. The expected result is Negative. Fact Sheet for Patients:  StrictlyIdeas.no Fact Sheet for Healthcare Providers:  BankingDealers.co.za This test is not yet approved or cleared by the [place] FDA and has been authorized for detection and/or diagnosis of SARS-CoV-2 by FDA under an Emergency Use Authorization (EUA).  This EUA will remain in effect (meaning this test can be used) for the duration of the COVID-19 declaration under Section 564(b)(1) of the Act, 21 U.S.C. section 360bbb-3(b)(1), unless the authorization is terminated or revoked sooner. Performed at [facility], [place] [address] [place], [place] [address]   Respiratory Panel by PCR     Status: None   Collection Time: 08/28/18  3:35 AM  Result Value Ref Range Status   Adenovirus NOT DETECTED NOT DETECTED Final   Coronavirus 229E NOT DETECTED NOT DETECTED Final    Comment: (NOTE) The Coronavirus on the Respiratory Panel, DOES NOT test for the novel  Coronavirus (2019 nCoV)    Coronavirus HKU1 NOT DETECTED NOT DETECTED Final   Coronavirus NL63 NOT DETECTED NOT DETECTED Final   Coronavirus OC43 NOT DETECTED NOT DETECTED Final   Metapneumovirus NOT DETECTED NOT DETECTED Final   Rhinovirus / Enterovirus NOT DETECTED NOT DETECTED Final   Influenza A NOT DETECTED NOT DETECTED Final   Influenza B NOT DETECTED NOT DETECTED Final   Parainfluenza Virus 1 NOT DETECTED NOT DETECTED Final   Parainfluenza Virus 2 NOT DETECTED NOT DETECTED Final   Parainfluenza Virus 3 NOT DETECTED NOT DETECTED Final   Parainfluenza Virus 4 NOT DETECTED NOT DETECTED Final   Respiratory Syncytial Virus NOT DETECTED NOT DETECTED Final   Bordetella pertussis NOT DETECTED NOT DETECTED Final   Chlamydophila pneumoniae NOT DETECTED NOT DETECTED Final   Mycoplasma pneumoniae NOT DETECTED NOT DETECTED Final    Comment: Performed at [facility], [address], [place], [place] [address]  MRSA PCR Screening     Status: None   Collection Time: 08/28/18  4:45 AM  Result Value Ref Range Status   MRSA by PCR NEGATIVE  NEGATIVE Final    Comment:        The GeneXpert MRSA Assay (FDA approved for NASAL specimens only), is one component of a comprehensive MRSA colonization surveillance program. It is not intended to diagnose MRSA infection nor to guide or monitor treatment for MRSA infections. Performed at [facility], [place] [address] [place], [place] [address]        Radiology Studies: No results found.    Scheduled Meds: . budesonide (PULMICORT) nebulizer solution  0.5 mg Nebulization BID  . fluticasone  2 spray Each Nare Daily  . insulin aspart  0-20 Units Subcutaneous TID WC  . insulin aspart  0-5 Units Subcutaneous QHS  .  insulin aspart  3 Units Subcutaneous TID WC  . insulin glargine  18 Units Subcutaneous Daily  . ipratropium-albuterol  3 mL Nebulization BID  . levothyroxine  250 mcg Oral Daily  . loratadine  10 mg Oral Daily  . methylPREDNISolone (SOLU-MEDROL) injection  40 mg Intravenous Q12H  . pantoprazole  40 mg Oral BID AC   Continuous Infusions:   LOS: 4 days    Time spent: 25 minutes   Dessa Phi, DO [facility] [email or web address] 09/01/2018, 10:52 AM

## 2018-09-02 ENCOUNTER — Telehealth: Payer: Self-pay

## 2018-09-02 ENCOUNTER — Telehealth: Payer: Self-pay | Admitting: Internal Medicine

## 2018-09-02 LAB — BASIC METABOLIC PANEL
Anion gap: 6 (ref 5–15)
BUN: 17 mg/dL (ref 6–20)
CO2: 24 mmol/L (ref 22–32)
Calcium: 8.1 mg/dL — ABNORMAL LOW (ref 8.9–10.3)
Chloride: 103 mmol/L (ref 98–111)
Creatinine, Ser: 0.69 mg/dL (ref 0.44–1.00)
GFR calc Af Amer: >60 mL/min (ref >60)
GFR calc non Af Amer: >60 mL/min (ref >60)
Glucose, Bld: 312 mg/dL — ABNORMAL HIGH (ref 70–99)
Potassium: 4 mmol/L (ref 3.5–5.1)
Sodium: 133 mmol/L — ABNORMAL LOW (ref 135–145)

## 2018-09-02 LAB — CBC
HCT: 24.6 % — ABNORMAL LOW (ref 36.0–46.0)
Hemoglobin: 8.2 g/dL — ABNORMAL LOW (ref 12.0–15.0)
MCH: 29.5 pg (ref 26.0–34.0)
MCHC: 33.3 g/dL (ref 30.0–36.0)
MCV: 88.5 fL (ref 80.0–100.0)
Platelets: 68 10*3/uL — ABNORMAL LOW (ref 150–400)
RBC: 2.78 MIL/uL — ABNORMAL LOW (ref 3.87–5.11)
RDW: 14.8 % (ref 11.5–15.5)
WBC: 3 10*3/uL — ABNORMAL LOW (ref 4.0–10.5)
nRBC: 0 % (ref 0.0–0.2)

## 2018-09-02 LAB — GLUCOSE, CAPILLARY
Glucose-Capillary: 272 mg/dL — ABNORMAL HIGH (ref 70–99)
Glucose-Capillary: 303 mg/dL — ABNORMAL HIGH (ref 70–99)
Glucose-Capillary: 293 mg/dL — ABNORMAL HIGH (ref 70–99)

## 2018-09-02 LAB — ACID FAST CULTURE WITH REFLEXED SENSITIVITIES (MYCOBACTERIA): Acid Fast Culture: NEGATIVE

## 2018-09-02 MED ORDER — TRAMADOL HCL 50 MG PO TABS: 50.0000 mg | ORAL_TABLET | Freq: Four times a day (QID) | ORAL | 0 refills | Status: AC | PRN

## 2018-09-02 MED ORDER — INSULIN GLARGINE 100 UNIT/ML ~~LOC~~ SOLN: 25.0000 [IU] | Freq: Every day | SUBCUTANEOUS | 3 refills | Status: DC

## 2018-09-02 MED ORDER — PREDNISONE 10 MG PO TABS: ORAL_TABLET | ORAL | 0 refills | Status: DC

## 2018-09-02 MED ORDER — LIVING WELL WITH DIABETES BOOK
Freq: Once | Status: AC
  Administered 2018-09-02: 13:00:00

## 2018-09-02 MED ORDER — INSULIN ASPART 100 UNIT/ML ~~LOC~~ SOLN: SUBCUTANEOUS | 0 refills | Status: DC

## 2018-09-02 MED ORDER — DM-GUAIFENESIN ER 30-600 MG PO TB12: 1.0000 | ORAL_TABLET | Freq: Two times a day (BID) | ORAL | 0 refills | Status: DC | PRN

## 2018-09-02 MED ORDER — "INSULIN SYRINGE 31G X 5/16"" 0.3 ML MISC": 0 refills | Status: DC

## 2018-09-02 MED ORDER — BLOOD GLUCOSE METER KIT: PACK | 0 refills | Status: AC

## 2018-09-02 MED FILL — BD UF INS SYR 1 ML 31GX5/16: 31G X 5/16" | 30 days supply | Qty: 100 | Fill #0

## 2018-09-02 MED FILL — NovoLIN R 100 UNIT/ML SOLN: 100 | 34 days supply | Qty: 30 | Fill #0

## 2018-09-02 MED FILL — predniSONE 10 MG TABS: 10 | 34 days supply | Qty: 74 | Fill #0

## 2018-09-02 MED FILL — FREESTYLE LANCETS: 25 days supply | Qty: 100 | Fill #0

## 2018-09-02 MED FILL — FREESTYLE LITE TEST STRIP: 25 days supply | Qty: 100 | Fill #0

## 2018-09-02 MED FILL — FREESTYLE LITE METER: 30 days supply | Qty: 1 | Fill #0

## 2018-09-02 MED FILL — LANTUS 100 UNITS/ML VIAL: 100 | 28 days supply | Qty: 10 | Fill #0

## 2018-09-02 MED FILL — traMADol HCL 50 MG TABS: 50 | 3 days supply | Qty: 12 | Fill #0

## 2018-09-02 NOTE — Telephone Encounter (Signed)
Pt called nurse line stating she is currently admitted at [facility], however she is concerned for discharge. Pt stated she does not have any money for health care, per patient she has family planning medicaid and can not afford her hospital stay or medications. Pt stated she can only get "regular" medicaid if she has a letter from PCP stating a disability. 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 Please advise.

## 2018-09-02 NOTE — Progress Notes (Signed)
Called pt's RN Josph Macho to discuss pt.  DM Coordinator working remotely due to [place] [employer].  Asked RN if patient was going to be able to afford Lantus and Novolog at time of d/c.  RN told me that the Care Manager had come to see pt and they are providing her with one month of Lantus and Novolog at time of d/c through the [facility] program.  After one month, patient has the [employer] through [facility] and can get all meds (including insulin) for low cost as well.  Asked RN to please make sure that pt has a copy of the SSI that she will follow at home to dose her Novolog.  Called and spoke with patient by phone.  Reviewed the following with patient: Lantus (what it is, how to take, when to take, etc) Novolog SSI (what it is, how to take, when to take, etc) Rotation of injection sites Hypoglycemia (signs, symptoms, treatment) CBG goals at home When to call the MD when CBGs are too high or too low Storage of insulin  Strongly encouraged pt to keep checking her CBGs at least TID AC at home (and occasionally after meals as well).  Pt knows she needs to check her CBGs so she can know how much Novolog to take.  Also strongly encouraged pt to stay in contact with her PCP with the [facility] after d/c.  Reminded pt to take her insulin as prescribed while she is taking the Prednisone and to follow back up with her PCP after she finishes the Prednisone as she may need to reduce or stop her insulin after the Prednisone is completed.  I did remind her to continue her insulin after she stops her Prednisone but that if she has issues with Hypoglycemia after stopping the Prednisone that she should call her PCP office to get advice on what to do with her insulin.  Patient was very appreciative of all the information.    Plan to call pt at home on either Monday (04/27) or Tuesday (04/28) next week to check on her and see if she has any more questions.  Pt stated she would look forward to the call next week.  Told  me to leave a voicemail for her if she doesn't answer and she will call me back.    --Will follow patient during hospitalization--  Wyn Quaker RN, MSN, CDE Diabetes Coordinator Inpatient Glycemic Control Team Team Pager: [phone] ([id number])

## 2018-09-02 NOTE — Telephone Encounter (Signed)
ATC pt, went to voicemail. LMTCB.

## 2018-09-02 NOTE — Discharge Summary (Signed)
Physician Discharge Summary  Holly Mueller:[id number] DOB: 09-14-1965 DOA: 08/27/2018  PCP: [name], MD  Admit date: 08/27/2018 Discharge date: 09/02/2018  Admitted From: Home Disposition:  Home   Recommendations for Outpatient Follow-up:  1. Follow up with PCP in 1 week. Need close follow up on blood sugar control while on steroid taper  2. Follow up with Pulmonology in 1 week  3. Follow up with GI as outpatient regarding cirrhosis  4. Please obtain CBC in 1 week to keep eye on pancytopenia with cirrhosis   Discharge Condition: Stable CODE STATUS: Full  Diet recommendation: Carb modified   Brief/Interim Summary: From H&P by Dr. Blaine Hamper on 4/19: "Holly Mueller is a 53 y.o. female with medical history significant of interstitial lung disease, diabetes mellitus, GERD, hypothyroidism, liver cirrhosis without ascites, pancytopenia, who presents with hemoptysis, shortness of breath and cough. Patient states that she has been having worsening shortness of breath for more than 1 week.  She has cough with little clear mucus production.  She does not have chest pain.  No fever or chills.  Patient states that she had little runny nose due to allergy, no sore throat.  Patient states that since yesterday, she has been coughing up blood intermittently, at least 7 times so far, with dark-colored blood per patient.  She has nausea and vomited once. Currently no vomiting, diarrhea or abdominal pain.  Denies symptoms of UTI or unilateral weakness.  Patient states he does not drink alcohol.   ED Course: pt was found to have negative COVID-19 test, negative FOBT, d-dimer 0.89, pancytopenia with WBC 2.1, hemoglobin 10.7, platelets 76 (patient had WBC 2.0, hemoglobin 11.5, platelet 68 on 07/19/2018), INR 1.4, renal function okay, temperature normal, soft blood pressure, oxygen saturation 93 to 95% on room air.  Chest x-ray showed stable interstitial lung disease.  Patient is admitted to stepdown  inpatient.  PCCM was consulted."  Interim: Autoimmune work-up was ordered.  PCCM and nephrology consulted.  Patient was tested for respiratory viral panel and SARS-CoV-2 which were negative. She was started on high dose steroids IV. Hemoptysis resolved. She will follow up with Pulmonology as outpatient.   Discharge Diagnoses:  Principal Problem:   Hemoptysis Active Problems:   Hypothyroidism   GERD (gastroesophageal reflux disease)   Cirrhosis of liver without ascites (HCC)   Type 2 diabetes mellitus without complication, without long-term current use of insulin (HCC)   ILD (interstitial lung disease) (HCC)   Pancytopenia (HCC)   Hemoptysis -No further episodes of hemoptysis.  SARS-CoV-2 negative, RVP negative.  ILD, likely UIP  -Appreciate PCCM, follow up as outpatient to discuss anti-fibrotics  -Continue to wean and taper steroid  Diabetes mellitus, type 2 -Hyperglycemia in setting of IV steroid use. Discharge on lantus 25 units QD and Novolog 5 units TIDWC with 0-20 units on sliding scale   Hypothyroidism -Continue Synthroid  Cirrhosis with ascites, and pancytopenia -Follow-up with GI as an outpatient -Platelets remain stable  Abdominal pain -Improved  Discharge Instructions  Discharge Instructions    Call MD for:  difficulty breathing, headache or visual disturbances   Complete by:  As directed    Call MD for:  extreme fatigue   Complete by:  As directed    Call MD for:  persistant dizziness or light-headedness   Complete by:  As directed    Call MD for:  persistant nausea and vomiting   Complete by:  As directed    Call MD for:  severe uncontrolled pain  Complete by:  As directed    Call MD for:  temperature >100.4   Complete by:  As directed    Diet Carb Modified   Complete by:  As directed    Discharge instructions   Complete by:  As directed    You were cared for by a hospitalist during your hospital stay. If you have any questions about your  discharge medications or the care you received while you were in the hospital after you are discharged, you can call the unit and ask to speak with the hospitalist on call if the hospitalist that took care of you is not available. Once you are discharged, your primary care physician will handle any further medical issues. Please note that NO REFILLS for any discharge medications will be authorized once you are discharged, as it is imperative that you return to your primary care physician (or establish a relationship with a primary care physician if you do not have one) for your aftercare needs so that they can reassess your need for medications and monitor your lab values.   Increase activity slowly   Complete by:  As directed      Allergies as of 09/02/2018      Reactions   Aspirin Nausea Only   Upset stomach      Medication List    TAKE these medications   Accu-Chek Softclix Lancets lancets USE UP TO 4 TIMES DAILY AS DIRECTED   acetaminophen 500 MG tablet Commonly known as:  TYLENOL Take 1,000 mg by mouth every 8 (eight) hours as needed (pain).   albuterol 108 (90 Base) MCG/ACT inhaler Commonly known as:  ProAir HFA INHALE 2 PUFFS INTO THE LUNGS EVERY 6 HOURS AS NEEDED FOR SHORTNESS OF BREATH What changed:    how much to take  how to take this  when to take this  reasons to take this  additional instructions   albuterol (2.5 MG/3ML) 0.083% nebulizer solution Commonly known as:  PROVENTIL Take 3 mLs (2.5 mg total) by nebulization every 6 (six) hours as needed for wheezing or shortness of breath. What changed:  Another medication with the same name was changed. Make sure you understand how and when to take each.   blood glucose meter kit and supplies Dispense based on patient and insurance preference. Use up to four times daily as directed. (FOR ICD-10 E10.9, E11.9).   blood glucose meter kit and supplies Kit Dispense based on patient and insurance preference. Use up to  four times daily as directed. (FOR ICD-9 250.00, 250.01).   cetirizine 10 MG tablet Commonly known as:  ZYRTEC Take 1 tablet (10 mg total) by mouth daily.   dextromethorphan-guaiFENesin 30-600 MG 12hr tablet Commonly known as:  MUCINEX DM Take 1 tablet by mouth 2 (two) times daily as needed for cough.   fluticasone 50 MCG/ACT nasal spray Commonly known as:  FLONASE Place 2 sprays into both nostrils daily.   glucose blood test strip Commonly known as:  Accu-Chek Aviva Plus USE UP TO 4 TIMES DAILY AS DIRECTED   ibuprofen 200 MG tablet Commonly known as:  ADVIL Take 600 mg by mouth every 6 (six) hours as needed for moderate pain.   insulin aspart 100 UNIT/ML injection Commonly known as:  NovoLOG Use 4 times daily with meals and before bed to administer using sliding scale provided   insulin glargine 100 UNIT/ML injection Commonly known as:  LANTUS Inject 0.25 mLs (25 Units total) into the skin at bedtime.  INSULIN SYRINGE .3CC/31GX5/16" 31G X 5/16" 0.3 ML Misc Use with lantus and novolog insulin   levothyroxine 125 MCG tablet Commonly known as:  SYNTHROID Take 2 tablets (250 mcg total) by mouth daily.   metFORMIN 500 MG tablet Commonly known as:  GLUCOPHAGE Take 1 tablet (500 mg total) by mouth 2 (two) times daily with a meal.   Mometasone Furoate 200 MCG/ACT Aero Commonly known as:  Asmanex HFA Inhale 2 puffs into the lungs 2 (two) times daily.   pantoprazole 40 MG tablet Commonly known as:  PROTONIX Take 1 tablet (40 mg total) by mouth 2 (two) times daily before a meal.   predniSONE 10 MG tablet Commonly known as:  DELTASONE Take 4 tabs for 7 days, then 3 tabs for 7 days, then 2 tabs for 7 days, then 1 tab for 7 days, then 1/2 tab for 7 days.   traMADol 50 MG tablet Commonly known as:  Ultram Take 1 tablet (50 mg total) by mouth every 6 (six) hours as needed for up to 3 days.   ursodiol 500 MG tablet Commonly known as:  ACTIGALL Take 1 tablet (500 mg total)  by mouth 2 (two) times daily.      Follow-up Information    [name], MD. Schedule an appointment as soon as possible for a visit in 1 week(s).   Specialty:  Family Medicine Contact information: [place] [address] [phone]        Brand Males, MD. Schedule an appointment as soon as possible for a visit in 1 week(s).   Specialty:  Pulmonary Disease Contact information: [place] [address] [phone]          Allergies  Allergen Reactions  . Aspirin Nausea Only    Upset stomach    Consultations:  PCCM   Procedures/Studies: Ct Angio Chest Pe W Or Wo Contrast  Result Date: 08/28/2018 CLINICAL DATA:  53 y/o  F; chest pain and dyspnea. EXAM: CT ANGIOGRAPHY CHEST WITH CONTRAST TECHNIQUE: Multidetector CT imaging of the chest was performed using the standard protocol during bolus administration of intravenous contrast. Multiplanar CT image reconstructions and MIPs were obtained to evaluate the vascular anatomy. CONTRAST:  123m OMNIPAQUE IOHEXOL 350 MG/ML SOLN COMPARISON:  04/05/2018 CT chest. 08/27/2018 chest radiograph. FINDINGS: Cardiovascular: Preferential opacification of the thoracic aorta. No evidence of thoracic aortic aneurysm or dissection. Normal heart size. No pericardial effusion. Respiratory motion artifact and suboptimal opacification of the pulmonary arteries. No central or proximal segmental pulmonary embolus. Mediastinum/Nodes: Heterogeneous and mildly enlarged thyroid gland. Normal esophagus and trachea. No axillary or mediastinal lymphadenopathy. Lungs/Pleura: Pulmonary fibrosis with peripheral and basilar predominance stable from prior high-resolution chest CT. No consolidation, effusion, or pneumothorax. Upper Abdomen: No acute abnormality. Musculoskeletal: No chest wall abnormality. No acute or significant osseous findings. Review of the MIP images confirms the above findings. IMPRESSION: 1.  Respiratory motion artifact and suboptimal opacification of the pulmonary arteries. No central or proximal segmental pulmonary embolus. No acute pulmonary process. 2. Findings of pulmonary fibrosis are stable from prior high-resolution chest CT. Electronically Signed   By: LKristine GarbeM.D.   On: 08/28/2018 04:17   Dg Chest Port 1 View  Result Date: 08/28/2018 CLINICAL DATA:  53y/o  F; cough, shortness of breath. EXAM: PORTABLE CHEST 1 VIEW COMPARISON:  07/05/2018 chest radiograph. FINDINGS: Stable cardiac silhouette given projection and technique. Low lung volumes. Stable coarse reticular opacities compatible with interstitial lung disease. No new  consolidation, effusion, or pneumothorax identified. No acute osseous abnormality is evident. IMPRESSION: Stable low lung volumes and findings of interstitial lung disease. No acute pulmonary process identified. Electronically Signed   By: Kristine Garbe M.D.   On: 08/28/2018 00:21   Dg Abd Portable 1v  Result Date: 08/29/2018 CLINICAL DATA:  Diffuse abd pain and vomiting EXAM: PORTABLE ABDOMEN - 1 VIEW COMPARISON:  CT abdomen 05/26/2012 FINDINGS: No dilated loops of large or small bowel. Gas and stool in the rectum. No pathologic calcifications. No organomegaly. No acute osseous abnormality IMPRESSION: No acute abdominal findings. Electronically Signed   By: Suzy Bouchard M.D.   On: 08/29/2018 11:51   Vas [place] Lower Extremity Venous (dvt)  Result Date: 08/28/2018  Lower Venous Study Indications: SOB.  Risk Factors: Obesity. Performing Technologist: Toma Copier RVS  Examination Guidelines: A complete evaluation includes B-mode imaging, spectral Doppler, color Doppler, and power Doppler as needed of all accessible portions of each vessel. Bilateral testing is considered an integral part of a complete examination. Limited examinations for reoccurring indications may be performed as noted.   +---------+---------------+---------+-----------+----------+------------------+ RIGHT    CompressibilityPhasicitySpontaneityPropertiesSummary            +---------+---------------+---------+-----------+----------+------------------+ CFV      Full           Yes      Yes                                     +---------+---------------+---------+-----------+----------+------------------+ SFJ      Full                                                            +---------+---------------+---------+-----------+----------+------------------+ FV Prox  Full           Yes      Yes                                     +---------+---------------+---------+-----------+----------+------------------+ FV Mid   Full                                                            +---------+---------------+---------+-----------+----------+------------------+ FV DistalFull           Yes      Yes                                     +---------+---------------+---------+-----------+----------+------------------+ PFV      Full           Yes      Yes                                     +---------+---------------+---------+-----------+----------+------------------+ POP      Full           Yes      Yes                                     +---------+---------------+---------+-----------+----------+------------------+  PTV      Full                                                            +---------+---------------+---------+-----------+----------+------------------+ PERO     Full                                         Difficult to image +---------+---------------+---------+-----------+----------+------------------+   +---------+---------------+---------+-----------+----------+------------------+ LEFT     CompressibilityPhasicitySpontaneityPropertiesSummary            +---------+---------------+---------+-----------+----------+------------------+ CFV      Full            Yes      Yes                                     +---------+---------------+---------+-----------+----------+------------------+ SFJ      Full                                                            +---------+---------------+---------+-----------+----------+------------------+ FV Prox  Full           Yes      Yes                                     +---------+---------------+---------+-----------+----------+------------------+ FV Mid   Full                                                            +---------+---------------+---------+-----------+----------+------------------+ FV DistalFull           Yes      Yes                                     +---------+---------------+---------+-----------+----------+------------------+ PFV      Full           Yes      Yes                                     +---------+---------------+---------+-----------+----------+------------------+ POP      Full           Yes      Yes                                     +---------+---------------+---------+-----------+----------+------------------+ PTV      Full  Difficult to image +---------+---------------+---------+-----------+----------+------------------+ PERO     Full                                         Difficult to image +---------+---------------+---------+-----------+----------+------------------+     Summary: Right: There is no evidence of deep vein thrombosis in the lower extremity. No cystic structure found in the popliteal fossa. Left: There is no evidence of deep vein thrombosis in the lower extremity. No cystic structure found in the popliteal fossa.  *See table(s) above for measurements and observations. Electronically signed by Servando Snare MD on 08/28/2018 at 12:35:40 PM.    Final       Discharge Exam: Vitals:   09/02/18 0749 09/02/18 0753  BP:    Pulse:    Resp:    Temp:    SpO2: 96% 96%    General:  Pt is alert, awake, not in acute distress Cardiovascular: RRR, S1/S2 +, no rubs, no gallops Respiratory: Minimal crackles bibasilar bases, no wheeze, no distress on room air  Abdominal: Soft, NT, ND, bowel sounds + Extremities: no edema, no cyanosis    The results of significant diagnostics from this hospitalization (including imaging, microbiology, ancillary and laboratory) are listed below for reference.     Microbiology: Recent Results (from the past 240 hour(s))  SARS Coronavirus 2 [facility] order, Performed in [facility] hospital lab)     Status: None   Collection Time: 08/27/18 11:48 PM  Result Value Ref Range Status   SARS Coronavirus 2 NEGATIVE NEGATIVE Final    Comment: (NOTE) If result is NEGATIVE SARS-CoV-2 target nucleic acids are NOT DETECTED. The SARS-CoV-2 RNA is generally detectable in upper and lower  respiratory specimens during the acute phase of infection. The lowest  concentration of SARS-CoV-2 viral copies this assay can detect is 250  copies / mL. A negative result does not preclude SARS-CoV-2 infection  and should not be used as the sole basis for treatment or other  patient management decisions.  A negative result may occur with  improper specimen collection / handling, submission of specimen other  than nasopharyngeal swab, presence of viral mutation(s) within the  areas targeted by this assay, and inadequate number of viral copies  (<250 copies / mL). A negative result must be combined with clinical  observations, patient history, and epidemiological information. If result is POSITIVE SARS-CoV-2 target nucleic acids are DETECTED. The SARS-CoV-2 RNA is generally detectable in upper and lower  respiratory specimens dur ing the acute phase of infection.  Positive  results are indicative of active infection with SARS-CoV-2.  Clinical  correlation with patient history and other diagnostic information is  necessary to determine patient infection status.   Positive results do  not rule out bacterial infection or co-infection with other viruses. If result is PRESUMPTIVE POSTIVE SARS-CoV-2 nucleic acids MAY BE PRESENT.   A presumptive positive result was obtained on the submitted specimen  and confirmed on repeat testing.  While 2019 novel coronavirus  (SARS-CoV-2) nucleic acids may be present in the submitted sample  additional confirmatory testing may be necessary for epidemiological  and / or clinical management purposes  to differentiate between  SARS-CoV-2 and other Sarbecovirus currently known to infect humans.  If clinically indicated additional testing with an alternate test  methodology [phone]) is advised. The SARS-CoV-2 RNA is generally  detectable in upper and lower respiratory sp ecimens during the acute  phase of infection. The expected result is Negative. Fact Sheet for Patients:  StrictlyIdeas.no Fact Sheet for Healthcare Providers: BankingDealers.co.za This test is not yet approved or cleared by the [place] FDA and has been authorized for detection and/or diagnosis of SARS-CoV-2 by FDA under an Emergency Use Authorization (EUA).  This EUA will remain in effect (meaning this test can be used) for the duration of the COVID-19 declaration under Section 564(b)(1) of the Act, 21 U.S.C. section 360bbb-3(b)(1), unless the authorization is terminated or revoked sooner. Performed at [facility], [place] [address] [place], [place] [address]   Respiratory Panel by PCR     Status: None   Collection Time: 08/28/18  3:35 AM  Result Value Ref Range Status   Adenovirus NOT DETECTED NOT DETECTED Final   Coronavirus 229E NOT DETECTED NOT DETECTED Final    Comment: (NOTE) The Coronavirus on the Respiratory Panel, DOES NOT test for the novel  Coronavirus (2019 nCoV)    Coronavirus HKU1 NOT DETECTED NOT DETECTED Final   Coronavirus NL63 NOT DETECTED NOT DETECTED  Final   Coronavirus OC43 NOT DETECTED NOT DETECTED Final   Metapneumovirus NOT DETECTED NOT DETECTED Final   Rhinovirus / Enterovirus NOT DETECTED NOT DETECTED Final   Influenza A NOT DETECTED NOT DETECTED Final   Influenza B NOT DETECTED NOT DETECTED Final   Parainfluenza Virus 1 NOT DETECTED NOT DETECTED Final   Parainfluenza Virus 2 NOT DETECTED NOT DETECTED Final   Parainfluenza Virus 3 NOT DETECTED NOT DETECTED Final   Parainfluenza Virus 4 NOT DETECTED NOT DETECTED Final   Respiratory Syncytial Virus NOT DETECTED NOT DETECTED Final   Bordetella pertussis NOT DETECTED NOT DETECTED Final   Chlamydophila pneumoniae NOT DETECTED NOT DETECTED Final   Mycoplasma pneumoniae NOT DETECTED NOT DETECTED Final    Comment: Performed at [facility], [address] [place], [place] [address]  MRSA PCR Screening     Status: None   Collection Time: 08/28/18  4:45 AM  Result Value Ref Range Status   MRSA by PCR NEGATIVE NEGATIVE Final    Comment:        The GeneXpert MRSA Assay (FDA approved for NASAL specimens only), is one component of a comprehensive MRSA colonization surveillance program. It is not intended to diagnose MRSA infection nor to guide or monitor treatment for MRSA infections. Performed at [facility], [employer] [place] [employer] [address], [place], [place] [address]      Labs: BNP (last 3 results) Recent Labs    03/03/18 1108 03/12/18 1021 08/28/18 0646  BNP 82.6 6.6 9.3   Basic Metabolic Panel: Recent Labs  Lab 08/27/18 2345 08/28/18 0646 08/31/18 0656 08/31/18 2221 09/01/18 0555 09/02/18 0547  NA 134* 134* 129*  --  131* 133*  K 3.8 4.2 4.3  --  4.1 4.0  CL 109 107 99  --  100 103  CO2 19* 21* 20*  --  23 24  GLUCOSE 293* 289* 405* 413* 299* 312*  BUN _0 --  19 17  CREATININE 0.61 0.63 0.69  --  0.61 0.69  CALCIUM 7.9* 8.1* 8.4*  --  8.4* 8.1*   Liver Function Tests: Recent Labs  Lab 08/27/18 2345  AST 62*  ALT 42  ALKPHOS 168*   BILITOT 1.9*  PROT 7.1  ALBUMIN 2.7*   No results for input(s): LIPASE, AMYLASE in the last 168 hours. No results for input(s): AMMONIA in the last 168 hours. CBC: Recent Labs  Lab 08/27/18 2345  08/30/18 0837 08/30/18 1508 08/31/18 0656 09/01/18 0555 09/02/18 0547  WBC 2.1*   < > 6.6 5.8 4.8 3.3* 3.0*  NEUTROABS 1.3*  --   --   --   --   --   --   HGB 10.7*   < > 8.4* 7.9* 7.7* 8.0* 8.2*  HCT 32.1*   < > 24.8* 24.0* 24.1* 24.0* 24.6*  MCV 89.2   < > 87.0 87.6 88.3 88.6 88.5  PLT 76*   < > 95* 89* 83* 74* 68*   < > = values in this interval not displayed.   Cardiac Enzymes: No results for input(s): CKTOTAL, CKMB, CKMBINDEX, TROPONINI in the last 168 hours. BNP: Invalid input(s): POCBNP CBG: Recent Labs  Lab 09/01/18 0721 09/01/18 1153 09/01/18 1716 09/01/18 2144 09/02/18 0732  GLUCAP 295* 390* 283* 329* 272*   D-Dimer No results for input(s): DDIMER in the last 72 hours. Hgb A1c No results for input(s): HGBA1C in the last 72 hours. Lipid Profile No results for input(s): CHOL, HDL, LDLCALC, TRIG, CHOLHDL, LDLDIRECT in the last 72 hours. Thyroid function studies No results for input(s): TSH, T4TOTAL, T3FREE, THYROIDAB in the last 72 hours.  Invalid input(s): FREET3 Anemia work up No results for input(s): VITAMINB12, FOLATE, FERRITIN, TIBC, IRON, RETICCTPCT in the last 72 hours. Urinalysis    Component Value Date/Time   COLORURINE YELLOW 12/28/2017 1950   APPEARANCEUR CLEAR 12/28/2017 1950   LABSPEC 1.012 12/28/2017 1950   PHURINE 7.0 12/28/2017 1950   GLUCOSEU NEGATIVE 12/28/2017 1950   HGBUR SMALL (A) 12/28/2017 1950   BILIRUBINUR NEGATIVE 12/28/2017 1950   KETONESUR NEGATIVE 12/28/2017 1950   PROTEINUR NEGATIVE 12/28/2017 1950   UROBILINOGEN 2.0 (H) 11/22/2013 1743   NITRITE NEGATIVE 12/28/2017 1950   LEUKOCYTESUR NEGATIVE 12/28/2017 1950   Sepsis Labs Invalid input(s): PROCALCITONIN,  WBC,  LACTICIDVEN Microbiology Recent Results (from the past  240 hour(s))  SARS Coronavirus 2 [facility] order, Performed in [place] hospital lab)     Status: None   Collection Time: 08/27/18 11:48 PM  Result Value Ref Range Status   SARS Coronavirus 2 NEGATIVE NEGATIVE Final    Comment: (NOTE) If result is NEGATIVE SARS-CoV-2 target nucleic acids are NOT DETECTED. The SARS-CoV-2 RNA is generally detectable in upper and lower  respiratory specimens during the acute phase of infection. The lowest  concentration of SARS-CoV-2 viral copies this assay can detect is 250  copies / mL. A negative result does not preclude SARS-CoV-2 infection  and should not be used as the sole basis for treatment or other  patient management decisions.  A negative result may occur with  improper specimen collection / handling, submission of specimen other  than nasopharyngeal swab, presence of viral mutation(s) within the  areas targeted by this assay, and inadequate number of viral copies  (<250 copies / mL). A negative result must be combined with clinical  observations, patient history, and epidemiological information. If result is POSITIVE SARS-CoV-2 target nucleic acids are DETECTED. The SARS-CoV-2 RNA is generally detectable in upper and lower  respiratory specimens dur ing the acute phase of infection.  Positive  results are indicative of active infection with SARS-CoV-2.  Clinical  correlation with patient history and other diagnostic information is  necessary to determine patient infection status.  Positive results do  not rule out bacterial infection or co-infection with other viruses. If result is PRESUMPTIVE POSTIVE SARS-CoV-2 nucleic acids MAY BE PRESENT.   A presumptive positive result was  obtained on the submitted specimen  and confirmed on repeat testing.  While 2019 novel coronavirus  (SARS-CoV-2) nucleic acids may be present in the submitted sample  additional confirmatory testing may be necessary for epidemiological  and / or clinical  management purposes  to differentiate between  SARS-CoV-2 and other Sarbecovirus currently known to infect humans.  If clinically indicated additional testing with an alternate test  methodology [phone]) is advised. The SARS-CoV-2 RNA is generally  detectable in upper and lower respiratory sp ecimens during the acute  phase of infection. The expected result is Negative. Fact Sheet for Patients:  StrictlyIdeas.no Fact Sheet for Healthcare Providers: BankingDealers.co.za This test is not yet approved or cleared by the [place] FDA and has been authorized for detection and/or diagnosis of SARS-CoV-2 by FDA under an Emergency Use Authorization (EUA).  This EUA will remain in effect (meaning this test can be used) for the duration of the COVID-19 declaration under Section 564(b)(1) of the Act, 21 U.S.C. section 360bbb-3(b)(1), unless the authorization is terminated or revoked sooner. Performed at [facility], [place] [address], [place], [place] [address]   Respiratory Panel by PCR     Status: None   Collection Time: 08/28/18  3:35 AM  Result Value Ref Range Status   Adenovirus NOT DETECTED NOT DETECTED Final   Coronavirus 229E NOT DETECTED NOT DETECTED Final    Comment: (NOTE) The Coronavirus on the Respiratory Panel, DOES NOT test for the novel  Coronavirus (2019 nCoV)    Coronavirus HKU1 NOT DETECTED NOT DETECTED Final   Coronavirus NL63 NOT DETECTED NOT DETECTED Final   Coronavirus OC43 NOT DETECTED NOT DETECTED Final   Metapneumovirus NOT DETECTED NOT DETECTED Final   Rhinovirus / Enterovirus NOT DETECTED NOT DETECTED Final   Influenza A NOT DETECTED NOT DETECTED Final   Influenza B NOT DETECTED NOT DETECTED Final   Parainfluenza Virus 1 NOT DETECTED NOT DETECTED Final   Parainfluenza Virus 2 NOT DETECTED NOT DETECTED Final   Parainfluenza Virus 3 NOT DETECTED NOT DETECTED Final   Parainfluenza Virus 4 NOT  DETECTED NOT DETECTED Final   Respiratory Syncytial Virus NOT DETECTED NOT DETECTED Final   Bordetella pertussis NOT DETECTED NOT DETECTED Final   Chlamydophila pneumoniae NOT DETECTED NOT DETECTED Final   Mycoplasma pneumoniae NOT DETECTED NOT DETECTED Final    Comment: Performed at [facility], [address], [place], [place] [address]  MRSA PCR Screening     Status: None   Collection Time: 08/28/18  4:45 AM  Result Value Ref Range Status   MRSA by PCR NEGATIVE NEGATIVE Final    Comment:        The GeneXpert MRSA Assay (FDA approved for NASAL specimens only), is one component of a comprehensive MRSA colonization surveillance program. It is not intended to diagnose MRSA infection nor to guide or monitor treatment for MRSA infections. Performed at [facility], [place] [address], [place], [place] [address]      Patient was seen and examined on the day of discharge and was found to be in stable condition. Time coordinating discharge: 35 minutes including assessment and coordination of care, as well as examination of the patient.   SIGNED:  Dessa Phi, DO [facility] [email or web address] 09/02/2018, 10:57 AM

## 2018-09-02 NOTE — Discharge Instructions (Signed)
Check your blood sugar 4 times daily: before breakfast, before lunch, before dinner, before bedtime. You may also check throughout the day if you feel that your blood sugar is too low or too high.   Lantus is your long-acting insulin. This should be used at the same time each day. Start with 25 units once daily.   Novolog is your short-acting correction insulin. Check your blood sugar prior to your meal, three times daily. Then depending on your blood sugar, inject Novolog 5 units AND Blood Glucose 121 - 150: 3 units  BG 151 - 200: 4 units  BG 201 - 250: 7 units  BG 251 - 300: 11 units  BG 301 - 350: 15 units  BG 351 - 400: 20 units   Also check your blood sugar prior to bedtime. Then depending on your blood sugar, inject insulin as follows: BG 70 - 120: 0 units  BG 121 - 150: 0 units  BG 151 - 200: 0 units  BG 201 - 250: 2 units  BG 251 - 300: 3 units  BG 301 - 350: 4 units  BG 351 - 400: 5 units   Keep a journal of your blood sugar results each day. Bring to your primary care physician.

## 2018-09-02 NOTE — Telephone Encounter (Signed)
There is nothing we can do at the office to help pt file for medicaid. Pt will have to go to social security in order to be able to apply for medicaid.  Attempted to call pt but unable to reach. Left message for pt to return call.

## 2018-09-02 NOTE — Telephone Encounter (Signed)
Attempted to call pt Holly Mueller, phone number provided rang busy. Many attempts have been made to contact this pt without success. Per triage protocol, will close encounter.

## 2018-09-02 NOTE — Progress Notes (Signed)
Attempted to give pt some diabetic teach from insulin kit. Pt seem to be overwhelmed with the cost of the insulin and was not retaining any information.

## 2018-09-04 ENCOUNTER — Telehealth: Payer: Self-pay | Admitting: Family Medicine

## 2018-09-04 NOTE — Telephone Encounter (Signed)
**  After Hours/ Emergency Line Call**  Received a call to report that Holly Mueller reporting blood in stool. Patient was recently admitted to [facility] hospital, discharged on 4/24. Patient has been on insulin since she is on prednisone for hemoptysis. Patient reports she is having issues with her stomach, able to go to bathroom and noticed blood in bowel movements. Unsure if it is 2/2 straining or constipation. Blood is only on toilet paper when wiping. Does notice about 1 cup blood in toilet bowel. No dizziness or lightheadedness. Blood in every bowel movement, has had >3 bowel movements today. Recommended that patient be evaluated in ED tonight. I asked patient if she had a way to get to ED and she stated she does. Recommended EMS if no ride as she may be losing significant blood if she has already had >3 bowel movements with a cup of blood each time.  Speaking full sentences on the phone. Red flags discussed.  Will forward to PCP.  Caroline More, DO PGY-2, [facility] 09/04/2018 8:01 PM

## 2018-09-05 ENCOUNTER — Other Ambulatory Visit: Payer: Self-pay

## 2018-09-05 ENCOUNTER — Telehealth: Payer: Self-pay | Admitting: Internal Medicine

## 2018-09-05 ENCOUNTER — Telehealth (INDEPENDENT_AMBULATORY_CARE_PROVIDER_SITE_OTHER): Payer: Self-pay | Admitting: Family Medicine

## 2018-09-05 DIAGNOSIS — E1169 Type 2 diabetes mellitus with other specified complication: Secondary | ICD-10-CM

## 2018-09-05 DIAGNOSIS — Z794 Long term (current) use of insulin: Secondary | ICD-10-CM

## 2018-09-05 DIAGNOSIS — K7460 Unspecified cirrhosis of liver: Secondary | ICD-10-CM

## 2018-09-05 DIAGNOSIS — K921 Melena: Secondary | ICD-10-CM

## 2018-09-05 NOTE — Telephone Encounter (Signed)
Left detailed message that this is a pulmonary office. Pt needs to contact division of social services to apply for medicaid.

## 2018-09-05 NOTE — Assessment & Plan Note (Signed)
Patient was discharged on Lantus 25 units with NovoLog 5 units with meals plus sliding scale.  She has been having hyperglycemia in the setting of steroid use from confusion about how to take her Lantus.  Reviewed hospital discharge medications today at length.  Patient voiced good understanding that she should try to take Lantus 25 units every morning and check her morning fasting sugars.  Reviewed symptoms of hypoglycemia.  Patient was unable to fully understand the rest of her insulin regimen and asked for an in person visit.  Appointment made for 09/13/2018.

## 2018-09-05 NOTE — Telephone Encounter (Signed)
Annie Paras D, LPN 3 days ago      ATC pt, went to voicemail. LMTCB

## 2018-09-05 NOTE — Assessment & Plan Note (Signed)
Likely from thrombocytopenia in the setting of liver cirrhosis.  Patient had hematochezia over the weekend that is since resolved.  She has no red flag symptoms of symptomatic anemia although she does admit to some fatigue that might be residual from her hospital stay.  Offered lab visit for hemoglobin recheck.  Patient declined and opted to continue with watchful waiting of her symptoms.  Needs follow-up with low-power GI.

## 2018-09-05 NOTE — Progress Notes (Signed)
[place] Telemedicine Visit  Patient consented to have virtual visit. Method of visit: Telephone, declined video  Encounter participants: Patient: Holly Mueller - located at home Provider: Bufford Lope - located at [facility] Others (if applicable): none  Chief Complaint: hospital follow up  HPI:  Patient states that she was recently hospitalized for hemoptysis and has been on prednisone steroids which is been making her sugars very elevated.  She has at least 1 more week of steroid to go. She has been taking her Lantus at night and her NovoLog with meals.  She got confused with the directions of her insulin and has been taking her nighttime Lantus based on the sliding scale chart that she got for her NovoLog.  For example based on her sugar readings she has been taking anywhere from 8 units to 14 units of Lantus at night.  She has still been using her sliding scale chart for her NovoLog and has been taking that on top of her 5 units with each meal.  Her home sugars have been elevated into the 300s.  They have been slightly improved today in the 200s.  She has had no nausea or vomiting.  Over the weekend she had some hematochezia.  She had 4-5 episodes of bright red blood per rectum that was mostly on wiping but also in the toilet bowl.  This has since self resolved and she has had no further episodes yesterday or today.  She does have some fatigue but no lightheadedness, chest pain, shortness of breath.  She is awaiting on a telemedicine visit with 1 of our gastroenterology.  ROS: per HPI  Pertinent PMHx: Interstitial lung disease, diabetes, liver cirrhosis without ascites, pancytopenia  Exam:  Respiratory: Normal work of breathing, normal speech  Assessment/Plan:  Diabetes ([place]) Patient was discharged on Lantus 25 units with NovoLog 5 units with meals plus sliding scale.  She has been having hyperglycemia in the setting of steroid use from confusion about how to  take her Lantus.  Reviewed hospital discharge medications today at length.  Patient voiced good understanding that she should try to take Lantus 25 units every morning and check her morning fasting sugars.  Reviewed symptoms of hypoglycemia.  Patient was unable to fully understand the rest of her insulin regimen and asked for an in person visit.  Appointment made for 09/13/2018.  Cirrhosis of liver without ascites (HCC) Likely from thrombocytopenia in the setting of liver cirrhosis.  Patient had hematochezia over the weekend that is since resolved.  She has no red flag symptoms of symptomatic anemia although she does admit to some fatigue that might be residual from her hospital stay.  Offered lab visit for hemoglobin recheck.  Patient declined and opted to continue with watchful waiting of her symptoms.  Needs follow-up with low-power GI.    Time spent during visit with patient: 31 minutes

## 2018-09-05 NOTE — Telephone Encounter (Signed)
We do not take the orange card.  She is welcome to schedule an appt and we can do a phone visit with her.  Please let her know that she will be billed for the visit at a later date.

## 2018-09-05 NOTE — Telephone Encounter (Signed)
Duplicate message Copied text into previous message This thread closed.

## 2018-09-05 NOTE — Telephone Encounter (Signed)
There is a referral for pt for cirrhosis of the liver.  Pt reported coughing up blood and blood in stool.  Pt was in [facility] all week last week.   Pt stated that she does not have a smart phone to do virtual visits and only has an orange card.  Please advise scheduling.

## 2018-09-06 ENCOUNTER — Telehealth: Payer: Self-pay

## 2018-09-06 NOTE — Telephone Encounter (Signed)
LMTCB

## 2018-09-06 NOTE — Telephone Encounter (Signed)
Holly Laming, do you have any insight into this? We don't do disability evals here and I have previously recommended that [name] go to the Social security office to get the disability form. Is that the correct next step? Holly Laming, do you know of any good places in town to get disability eval?

## 2018-09-07 ENCOUNTER — Telehealth: Payer: Self-pay | Admitting: Licensed Clinical Social Worker

## 2018-09-07 ENCOUNTER — Encounter: Payer: Self-pay | Admitting: Licensed Clinical Social Worker

## 2018-09-07 NOTE — Telephone Encounter (Signed)
  09/07/2018  Type of Service: General Social Work Consult via phone Start time: 4:10   End time: 4:30 Total time: 20 minutes  Name: Holly Mueller MRN: [id number] DOB: June 26, 1965  Confirmed patient's address: Yes    Confirmed patient's date of birth  Yes   Holly Mueller is a 53 y.o. female referred by Dr Freddy Finner for concerns and questions for disability..  Review of patient's consultants notes from appropriate care team members was performed as part of care coordination referral.   LCSW returned patient's call and assessed needs and barriers. Patient is pleasant and engaged in conversation. She is short of breath at times.  States she has an appointment with provider next week.  Patient reports taking her medications and main concern is getting enough food for her family.  Patient lives with her two adult sons , has no income. Receives foodstamps but reports it's not enough. Patient received Family Planning Medicaid and also has the [employer].  Duration of problem/ how impacting : 3 months Recent life changes: out of work due to health and not Covid-19 Strengths: family assist with transportation needs. Issues discussed: support system, community resource, Medicaid, Social Security disability and how patient has managed in the past. ASSESSMENT: Patient is currently experiencing food insecurities and which are exacerbated by no household income due to her health and Covid-19 shelter in place.  Patient has utilized [facility] foodbox program and pick up food today from [employer].   Intervention: SDOH (Social Determinants of Health) screening performed today related to challenges with: Armed forces logistics/support/administrative officer . Provided client with information about various location for food and the phone number for social security administration if she decides to apply for disability.  Patient understands this is a lengthy process, also utilized engagement as part of my intervention. Other  interventions include: (Problem-solving teaching/coping strategies and Psychoeducation.    Plan:  Patient will keep appointment with provider to address medical concerns. 1. Patient will go food drive up locations provided ( [employer], [employer] and [employer]) 2. She will call Social Security Administration to apply for disability. [name], MD has been notified of this outreach and Ms. Federico Flake Rands's plan.   Casimer Lanius, [facility]   [phone] 4:46 PM

## 2018-09-07 NOTE — Telephone Encounter (Signed)
Outreach Note  09/07/2018 Name: TARICA HARL MRN: [id number] DOB: 1965/10/02  Referred by: [name], MD Reason for referral : Community Resources (disability information )  An unsuccessful telephone outreach to patient was attempted today ref. The above consult. Left voice message  Follow Up Plan: LCSW will call again in 1 to 2 days. If no return call is received.  Dr. Lindell Noe has been notified of this attempt.  Casimer Lanius, [place]   [phone] 1:41 PM

## 2018-09-07 NOTE — Telephone Encounter (Signed)
LVM for patient in detail that our office cannot apply application for medicaid at this time, she will need to call the social security office at phone [phone] to apply over the phone, or she can complete application information online. Nothing further needed at this time Closing encounter as we have left over three messages for patient and we cannot apply for pt for medicaid in office. When patient calls back a new encounter can be opened.

## 2018-09-08 ENCOUNTER — Telehealth: Payer: Medicaid Other

## 2018-09-13 ENCOUNTER — Ambulatory Visit (INDEPENDENT_AMBULATORY_CARE_PROVIDER_SITE_OTHER): Payer: Medicaid Other | Admitting: Internal Medicine

## 2018-09-13 ENCOUNTER — Encounter: Payer: Self-pay | Admitting: Internal Medicine

## 2018-09-13 ENCOUNTER — Other Ambulatory Visit: Payer: Self-pay

## 2018-09-13 ENCOUNTER — Telehealth: Payer: Self-pay | Admitting: *Deleted

## 2018-09-13 ENCOUNTER — Ambulatory Visit (INDEPENDENT_AMBULATORY_CARE_PROVIDER_SITE_OTHER): Payer: Self-pay | Admitting: Family Medicine

## 2018-09-13 ENCOUNTER — Ambulatory Visit (HOSPITAL_COMMUNITY)
Admission: RE | Admit: 2018-09-13 | Discharge: 2018-09-13 | Disposition: A | Discharge status: Home / Self Care | Payer: Medicaid Other | Source: Ambulatory Visit | Attending: Family Medicine | Admitting: Family Medicine

## 2018-09-13 VITALS — BP 109/60 | HR 97

## 2018-09-13 DIAGNOSIS — E119 Type 2 diabetes mellitus without complications: Secondary | ICD-10-CM

## 2018-09-13 DIAGNOSIS — R002 Palpitations: Secondary | ICD-10-CM

## 2018-09-13 DIAGNOSIS — Z598 Other problems related to housing and economic circumstances: Secondary | ICD-10-CM

## 2018-09-13 DIAGNOSIS — Z5989 Other problems related to housing and economic circumstances: Secondary | ICD-10-CM

## 2018-09-13 DIAGNOSIS — K743 Primary biliary cirrhosis: Secondary | ICD-10-CM

## 2018-09-13 DIAGNOSIS — D61818 Other pancytopenia: Secondary | ICD-10-CM

## 2018-09-13 DIAGNOSIS — K7460 Unspecified cirrhosis of liver: Secondary | ICD-10-CM

## 2018-09-13 DIAGNOSIS — J849 Interstitial pulmonary disease, unspecified: Secondary | ICD-10-CM

## 2018-09-13 DIAGNOSIS — E039 Hypothyroidism, unspecified: Secondary | ICD-10-CM

## 2018-09-13 DIAGNOSIS — R0602 Shortness of breath: Secondary | ICD-10-CM

## 2018-09-13 MED ORDER — GLUCOSE BLOOD VI STRP: ORAL_STRIP | 0 refills | Status: DC

## 2018-09-13 NOTE — Patient Instructions (Signed)
Thank you for coming in to see [place] today. Please see below to review our plan for today's visit.  1.  I called in a refill for your testing strips.  Continue taking your fasting blood sugar before breakfast after waking up and 2 hours after your biggest meal.  Follow the sliding scale to determine how much NovoLog you take after each meal.  Increase your metformin to 2 tablets twice daily for a total of 1000 mg twice daily.  We will have you follow-up in 2 weeks.  Please bring your medications and glucometer with you. 2.  Your heart rate appeared to be normal today in the clinic, however I will check your thyroid level and blood level today.  I will call you if there is any abnormalities to your labs within 48 hours, otherwise you will see results in the mail. 3.  Follow-up with the GI doctor later today and schedule an appointment with the pulmonologist in June or July.  Let us know if you have any difficulty getting a hold of them.  Please call the clinic at [phone] if your symptoms worsen or you have any concerns. It was our pleasure to serve you.  Harriet [place], [place], PGY-3

## 2018-09-13 NOTE — Progress Notes (Signed)
TELEHEALTH ENCOUNTER IN SETTING OF COVID-19 PANDEMIC - REQUESTED BY PATIENT SERVICE PROVIDED BY TELEMEDECINE - TYPE: Telephone, A/V not possible PATIENT LOCATION: home PATIENT HAS CONSENTED TO TELEHEALTH VISIT PROVIDER LOCATION: OFFICE REFERRING PROVIDER: [name], MD  PARTICIPANTS OTHER THAN PATIENT:none TIME SPENT ON CALL:25 minutes   Holly Mueller 53 y.o. 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) This is a new problem to me, though it is been present for some time.  I want to try to get the records from the liver clinic and review them.  Perhaps there is a compassionate use program for ursodeoxycholic acid.  She should have an endoscopy to look for varices again.  A screening colonoscopy might be appropriate as well though with her comorbidities that might be a moot point.  I do not think there is any urgency for any of these.  She is at least a moderate risk of complications from [id number].  She should also have immunizations for hepatitis A and B unless she is immune to hepatitis A, I do not have that result but she is nave to hepatitis B at least when she was tested previously.  If appropriate a Prevnar 13 vaccine may be needed.  I need to double check on what the recommendations on that are currently as I have her they have changed.  She has had 2 chest CT since November of last year I do not think the whole liver was imaged so we need to consider repeat cross-sectional imaging of the liver with ultrasound but that is not a high priority given what we know either.  Pancytopenia ([place]) Some of this is due to her liver disease and portal hypertension but her ferritin was only 28 so iron supplementation is in order.  Uninsured This is a major barrier to her care.      Subjective:   Chief Complaint: Cirrhosis, follow-up after hospitalization  HPI The patient is having a telehealth visit because of a  diagnosis of cirrhosis, which was made in 2014 at a liver biopsy during hernia repair by Dr. Lucia Gaskins.  I will saw her in 2015 for dysphagia but was unaware of that diagnosis.  Subsequently she reports that she was seen in the [place] [facility] with Roosevelt Locks NP and was treated with a medicine that starts with a "you".  When I said ursodiol she said yes.  However she cannot afford that anymore and has not been taking it.  Chart review shows that she has had a serologic work-up, my partner was consulted when she was hospitalized in 2019 and imaging showed cirrhosis again, and she had positive mitochondrial antibodies and a positive speckled ANA with a titer of 1: 80.  Mitochondrial antibody titer was very high at 180.  So presumably she was diagnosed with primary biliary cholangitis causing cirrhosis.  In 2015 I dilated an esophageal ring, she did not have varices and biopsies were negative for eosinophilic esophagitis or gastritis.  As best I know she has not had decompensated liver disease, no ascites she denies leg swelling or increasing abdominal girth.  She does unfortunately have pretty severe interstitial lung disease.  Her infectious hepatitis screening studies have been negative.  She has had some pancytopenia issues.  She was in the hospital with chest pain with hemoptysis and dyspnea in April at which point follow-up was recommended with me.  Imaging over the past 6 months has shown the  cirrhosis with stigmata of portal venous hypertension including splenomegaly.  Her most recent laboratory studies show a white blood cell count of 3 hemoglobin of 8.2 and a platelet count of 68,000.  Alkaline phosphatase 168 albumin 2.7 AST 62 ALT 42.  Bilirubin 1.9.   She has never had a screening colonoscopy.  Her main problems these days are severe dyspnea on exertion.  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  She does not have health insurance and she is trying to get Medicaid but says she needs to be declared disabled.  She is not on home O2.  There are no bowel movement irregularities.  No significant abdominal pain.  She does have uncontrolled diabetes mellitus type 2. Allergies  Allergen Reactions  . Aspirin Nausea Only    Upset stomach   Current Meds  Medication Sig  . ACCU-CHEK SOFTCLIX LANCETS lancets USE UP TO 4 TIMES DAILY AS DIRECTED  . acetaminophen (TYLENOL) 500 MG tablet Take 1,000 mg by mouth every 8 (eight) hours as needed (pain).  Marland Kitchen albuterol (PROAIR HFA) 108 (90 Base) MCG/ACT inhaler INHALE 2 PUFFS INTO THE LUNGS EVERY 6 HOURS AS NEEDED FOR SHORTNESS OF BREATH (Patient taking differently: Inhale 1-2 puffs into the lungs every 6 (six) hours as needed for wheezing or shortness of breath. )  . albuterol (PROVENTIL) (2.5 MG/3ML) 0.083% nebulizer solution Take 3 mLs (2.5 mg total) by nebulization every 6 (six) hours as needed for wheezing or shortness of breath.  . blood glucose meter kit and supplies KIT Dispense based on patient and insurance preference. Use up to four times daily as directed. (FOR ICD-9 250.00, 250.01).  . blood glucose meter kit and supplies Dispense based on patient and insurance preference. Use up to four times daily as directed. (FOR ICD-10 E10.9, E11.9).  . cetirizine (ZYRTEC) 10 MG tablet Take 1 tablet (10 mg total) by mouth daily.  . fluticasone (FLONASE) 50 MCG/ACT nasal spray Place 2 sprays into both nostrils daily.  Marland Kitchen ibuprofen (ADVIL,MOTRIN) 200 MG tablet Take 600 mg by mouth every 6 (six) hours as needed for moderate pain.  Marland Kitchen insulin aspart (NOVOLOG) 100 UNIT/ML injection Use 4 times daily with meals and before bed to administer using sliding scale provided  . insulin glargine (LANTUS) 100 UNIT/ML injection Inject 0.25 mLs (25 Units total) into the skin at bedtime.  . Insulin Syringe-Needle U-100 (INSULIN SYRINGE .3CC/31GX5/16") 31G X 5/16" 0.3 ML MISC  Use with lantus and novolog insulin  . levothyroxine (SYNTHROID, LEVOTHROID) 125 MCG tablet Take 2 tablets (250 mcg total) by mouth daily.  . metFORMIN (GLUCOPHAGE) 500 MG tablet Take 1 tablet (500 mg total) by mouth 2 (two) times daily with a meal.  . Mometasone Furoate (ASMANEX HFA) 200 MCG/ACT AERO Inhale 2 puffs into the lungs 2 (two) times daily.  . pantoprazole (PROTONIX) 40 MG tablet Take 1 tablet (40 mg total) by mouth 2 (two) times daily before a meal.  . predniSONE (DELTASONE) 10 MG tablet Take 4 tabs for 7 days, then 3 tabs for 7 days, then 2 tabs for 7 days, then 1 tab for 7 days, then 1/2 tab for 7 days.  . [DISCONTINUED] glucose blood (ACCU-CHEK AVIVA PLUS) test strip USE UP TO 4 TIMES DAILY AS DIRECTED   Past Medical History:  Diagnosis Date  . Arthritis    bursitis left hip flares-not an issue  . Cirrhosis ([place])   .  Diabetes mellitus without complication ([place])   . Edentulous    10-19-13 at present  . Epilepsy ([place]) in 1972   No seizures since 1972. Previously treated with phenobarbital.   . Fever 06/2017  . GERD (gastroesophageal reflux disease) 2008  . Headache(784.0)    hx of migraines   . Hypothyroidism 2008  . Lower esophageal ring 08/18/2013  . Seasonal allergies 2003  . Shortness of breath    Past Surgical History:  Procedure Laterality Date  . BALLOON DILATION N/A 10/24/2013   Procedure: BALLOON DILATION;  Surgeon: Gatha Mayer, MD;  Location: [facility];  Service: Endoscopy;  Laterality: N/A;  . CESAREAN SECTION     x2  . DENTAL SURGERY     multiple extractions 3'15  . ESOPHAGOGASTRODUODENOSCOPY N/A 10/24/2013   Procedure: ESOPHAGOGASTRODUODENOSCOPY (EGD);  Surgeon: Gatha Mayer, MD;  Location: [facility];  Service: Endoscopy;  Laterality: N/A;  . LIVER BIOPSY  06/30/2012   Procedure: LIVER BIOPSY;  Surgeon: Shann Medal, MD;  Location: [facility];  Service: General;;  . NOVASURE ABLATION    . SHOULDER ARTHROSCOPY Right 2011  . UMBILICAL HERNIA REPAIR  N/A 06/30/2012   Procedure: remove umbilicus;  Surgeon: Shann Medal, MD;  Location: [facility];  Service: General;  Laterality: N/A;  . VENTRAL HERNIA REPAIR N/A 06/30/2012   Procedure: LAPAROSCOPIC VENTRAL HERNIA;  Surgeon: Shann Medal, MD;  Location: [facility];  Service: General;  Laterality: N/A;  With Mesh  . VIDEO BRONCHOSCOPY Bilateral 07/20/2018   Procedure: VIDEO BRONCHOSCOPY WITHOUT FLUORO;  Surgeon: Brand Males, MD;  Location: [facility];  Service: Cardiopulmonary;  Laterality: Bilateral;  . 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   Review of Systems See HPI.

## 2018-09-13 NOTE — Assessment & Plan Note (Deleted)
This is a new problem to me, though it is been present for some time.  I want to try to get the records from the liver clinic and review them.  Perhaps there is a compassionate use program for ursodeoxycholic acid.  She should have an endoscopy to look for varices again.  A screening colonoscopy might be appropriate as well though with her comorbidities that might be a moot point.  I do not think there is any urgency for any of these.  She is at least a moderate risk of complications from [id number].  She should also have immunizations for hepatitis A and B unless she is immune to hepatitis A, I do not have that result but she is nave to hepatitis B at least when she was tested previously.  If appropriate a Prevnar 13 vaccine may be needed.  I need to double check on what the recommendations on that are currently as I have her they have changed.

## 2018-09-13 NOTE — Assessment & Plan Note (Signed)
History of good adherence to high-dose Synthroid, 250 mcg with no missed doses.  Reports palpitations, however EKG reassuring without arrhythmia or heart block.  Did have elevated TSH which would not explain sensation of palpitations. - Checking TSH and will follow-up with free T4 and T3 total if indicated at follow-up in 2 weeks - Continue Synthroid 250 mcg daily

## 2018-09-13 NOTE — Assessment & Plan Note (Addendum)
This is a new problem to me, though it is been present for some time.  I want to try to get the records from the liver clinic and review them.  Perhaps there is a compassionate use program for ursodeoxycholic acid.  She should have an endoscopy to look for varices again.  A screening colonoscopy might be appropriate as well though with her comorbidities that might be a moot point.  I do not think there is any urgency for any of these.  She is at least a moderate risk of complications from [id number]-16.  She should also have immunizations for hepatitis A and B unless she is immune to hepatitis A, I do not have that result but she is nave to hepatitis B at least when she was tested previously.  If appropriate a Prevnar 13 vaccine may be needed.  I need to double check on what the recommendations on that are currently as I have her they have changed.  She has had 2 chest CT since November of last year I do not think the whole liver was imaged so we need to consider repeat cross-sectional imaging of the liver with ultrasound but that is not a high priority given what we know either.

## 2018-09-13 NOTE — Assessment & Plan Note (Addendum)
History of adequate control, recently exacerbated by prednisone use.  Suboptimal dose of metformin with no signs of kidney impairment based on recent labs.  No history of hypoglycemia.  Patient denies history of GI side effects related to metformin use. - Increasing metformin to 1000 mg twice daily - Continue Lantus at 25 units nightly due to adequately controlled fasting CBGs and continue NovoLog sliding scale based on postprandial level with instructions to check largest meal only - Refilled testing strips - Annual diabetic foot exam performed - Checking microalbumin creatinine ratio

## 2018-09-13 NOTE — Assessment & Plan Note (Addendum)
Stable on prednisone following hospitalization for hemoptysis.  Has not followed up with pulmonology likely due to [place] pandemic.  No signs of fluid overload on exam to explain chronic SOB and is at dry weight of 245 pounds. - Advised to continue prednisone 40 mg daily and contact pulmonology to schedule appointment in June/July - Follow-up with GI televisit - Checking CBC for chronic SOB, transfusion threshold <7.0 - Reviewed return precautions

## 2018-09-13 NOTE — Telephone Encounter (Signed)
Pt  Calls because [place] told her that she needs a new meter since she has the orange card.  LMOVM of [facility] pharmacy for clarification. Christen Bame, CMA

## 2018-09-13 NOTE — Progress Notes (Signed)
Subjective   Patient ID: Holly Mueller    DOB: 1966/03/25, 53 y.o. female   MRN: [id number]  CC: "Diabetes follow-up"  HPI: Holly Mueller is a 53 y.o. female who presents to clinic today for the following:  Diabetes: Holly Mueller has a history of insulin-dependent diabetes well controlled until recently since starting prednisone for a flare of her ILD.  Previously, she was having confusion about how to take her insulin, however she is now taking 25 units of her Lantus nightly with fasting CBGs in the 150s and sliding scale NovoLog with CBGs in the 300s (however she is checking these before eating, not postprandial).  She recently ran out of her testing strips 3 days ago but says her glucometer is working and brought it with her.  No history of hypoglycemia.  She is also on metformin at 1000 mg daily.  Shortness of breath: Chronic issue for over 1 year with no recent exacerbation per patient.  Gets winded walking back from the mailbox.  Has multiple comorbidities which may be contributing including ILD, cirrhosis, and chronic anemia also related to her cirrhosis.  She reports a remote history of hematochezia for 3 days which resolved 2 weeks ago.  Denies any change in her weight and reports normal bowel movements now.  She has no prior history of colonoscopy.  She has a tele-visit scheduled later today with GI since her hospitalization last month hemoptysis which has resolved since starting prednisone.  She denies chest pain, syncope.  Her primary concern is there chronic fatigue.  ROS: see HPI for pertinent.  [place]: Reviewed. Smoking status reviewed. Medications reviewed.  Objective   BP 109/60   Pulse 97   LMP  (LMP Unknown)   SpO2 96%  Vitals and nursing note reviewed.  General: well nourished, well developed, NAD with non-toxic appearance HEENT: normocephalic, atraumatic, moist mucous membranes Neck: supple, non-tender without lymphadenopathy, absent JVD Cardiovascular: regular rate  and rhythm with holosystolic murmur without rubs, or gallops Lungs: coarse bibasilar crackles with normal work of breathing Abdomen: obese, soft, non-tender, non-distended, normoactive bowel sounds Skin: warm, dry, no rashes or lesions, cap refill < 2 seconds Extremities: warm and well perfused, normal tone, no edema  Assessment & Plan   Hypothyroidism History of good adherence to high-dose Synthroid, 250 mcg with no missed doses.  Reports palpitations, however EKG reassuring without arrhythmia or heart block.  Did have elevated TSH which would not explain sensation of palpitations. - Checking TSH and will follow-up with free T4 and T3 total if indicated at follow-up in 2 weeks - Continue Synthroid 250 mcg daily  ILD (interstitial lung disease) (HCC) Stable on prednisone following hospitalization for hemoptysis.  Has not followed up with pulmonology likely due to [place] pandemic.  No signs of fluid overload on exam to explain chronic SOB and is at dry weight of 245 pounds. - Advised to continue prednisone 40 mg daily and contact pulmonology to schedule appointment in June/July - Follow-up with GI televisit - Checking CBC for chronic SOB, transfusion threshold <7.0 - Reviewed return precautions  Controlled type 2 diabetes mellitus without complication, without long-term current use of insulin (HCC) History of adequate control, recently exacerbated by prednisone use.  Suboptimal dose of metformin with no signs of kidney impairment based on recent labs.  No history of hypoglycemia.  Patient denies history of GI side effects related to metformin use. - Increasing metformin to 1000 mg twice daily - Continue Lantus at 25 units nightly due  to adequately controlled fasting CBGs and continue NovoLog sliding scale based on postprandial level with instructions to check largest meal only - Refilled testing strips - Annual diabetic foot exam performed - Checking microalbumin creatinine ratio  Orders  Placed This Encounter  Procedures  . CBC  . TSH  . Microalbumin/Creatinine Ratio, Urine  . EKG 12-Lead   Meds ordered this encounter  Medications  . glucose blood (ACCU-CHEK AVIVA PLUS) test strip    Sig: USE UP TO 4 TIMES DAILY AS DIRECTED    Dispense:  100 each    Refill:  0    Harriet Butte, DO [place], PGY-3 09/13/2018, 3:15 PM

## 2018-09-13 NOTE — Assessment & Plan Note (Signed)
Some of this is due to her liver disease and portal hypertension but her ferritin was only 28 so iron supplementation is in order.

## 2018-09-13 NOTE — Assessment & Plan Note (Signed)
This is a major barrier to her care.

## 2018-09-14 ENCOUNTER — Telehealth: Payer: Self-pay | Admitting: Internal Medicine

## 2018-09-14 ENCOUNTER — Encounter: Payer: Self-pay | Admitting: Family Medicine

## 2018-09-14 LAB — CBC
Hematocrit: 26.1 % — ABNORMAL LOW (ref 34.0–46.6)
Hemoglobin: 8.5 g/dL — ABNORMAL LOW (ref 11.1–15.9)
MCH: 27.2 pg (ref 26.6–33.0)
MCHC: 32.6 g/dL (ref 31.5–35.7)
MCV: 84 fL (ref 79–97)
Platelets: 64 10*3/uL — CL (ref 150–450)
RBC: 3.12 x10E6/uL — ABNORMAL LOW (ref 3.77–5.28)
RDW: 14.4 % (ref 11.7–15.4)
WBC: 3.1 10*3/uL — ABNORMAL LOW (ref 3.4–10.8)

## 2018-09-14 LAB — MICROALBUMIN / CREATININE URINE RATIO
Creatinine, Urine: 62.5 mg/dL
Microalb/Creat Ratio: 12 mg/g creat (ref 0–29)
Microalbumin, Urine: 7.2 ug/mL

## 2018-09-14 LAB — TSH: TSH: 0.659 u[IU]/mL (ref 0.450–4.500)

## 2018-09-14 NOTE — Telephone Encounter (Signed)
She is going to go to [facility] and sign a records release. She was seen once on 10/08/2017 by Dr Ronnette Juniper.

## 2018-09-14 NOTE — Telephone Encounter (Signed)
Since pt now has medicaid [facility] will need script for accucheck guide.  Will forward to MD. Christen Bame, CMA

## 2018-09-14 NOTE — Telephone Encounter (Signed)
Pt returned call.  She is ok with [place] sending in the meter to [facility].  She states that she knew she only had family planning and thought she told them.    To MD to approve.  Christen Bame, CMA

## 2018-09-14 NOTE — Patient Instructions (Signed)
We are requesting your records from the [facility] for review.

## 2018-09-14 NOTE — Telephone Encounter (Signed)
Received call back from [facility].  When they tried to run it pt no longer has medicaid.  Only has family planning, which will not cover any meter.  If pt does not have insurance then a true metrix meter, strips and true plus lancets will need to be sent in.  I have called pt and lmovm for pt to return call.  Need to find out insurance info. Christen Bame, CMA

## 2018-09-14 NOTE — Telephone Encounter (Signed)
Pt has a new meter. Did they specify what kind of meter they need?  Harriet Butte, [place], PGY-3

## 2018-09-16 MED ORDER — GLUCOSE BLOOD VI STRP: ORAL_STRIP | 12 refills | Status: DC

## 2018-09-16 MED ORDER — TRUE METRIX METER DEVI: 1.0000 | Freq: Once | 0 refills | Status: AC

## 2018-09-16 MED ORDER — TRUEPLUS LANCETS 28G MISC: 1 refills | Status: DC

## 2018-09-16 MED FILL — TRUE METRIX TEST STRIP: 30 days supply | Qty: 100 | Fill #0

## 2018-09-16 MED FILL — TRUEplus LANCETS 28G MISC: 30 days supply | Qty: 100 | Fill #0

## 2018-09-16 MED FILL — !TRUE METRIX BLOOD GLUCOSE: 1 days supply | Qty: 1 | Fill #0

## 2018-09-16 NOTE — Telephone Encounter (Signed)
I sent the pended meter and lancets to [facility].

## 2018-09-16 NOTE — Telephone Encounter (Signed)
Pt called back and was informed that the new meter was sent in this AM.  She will check with [place]. Christen Bame, CMA

## 2018-09-21 ENCOUNTER — Telehealth: Payer: Self-pay | Admitting: Internal Medicine

## 2018-09-21 NOTE — Telephone Encounter (Signed)
Patient went to [facility] yesterday and signed a records release. I called [facility] and they are going to try to re-fax the records.

## 2018-09-21 NOTE — Telephone Encounter (Signed)
Pt requested to speak to you about release papers.

## 2018-09-21 NOTE — Telephone Encounter (Signed)
Left her a message to call me back.

## 2018-09-22 ENCOUNTER — Encounter: Payer: Self-pay | Admitting: Acute Care

## 2018-09-22 ENCOUNTER — Emergency Department (HOSPITAL_COMMUNITY): Payer: Medicaid Other

## 2018-09-22 ENCOUNTER — Ambulatory Visit (INDEPENDENT_AMBULATORY_CARE_PROVIDER_SITE_OTHER): Payer: Medicaid Other

## 2018-09-22 ENCOUNTER — Encounter (HOSPITAL_COMMUNITY): Payer: Self-pay | Admitting: Emergency Medicine

## 2018-09-22 ENCOUNTER — Other Ambulatory Visit: Payer: Self-pay

## 2018-09-22 ENCOUNTER — Inpatient Hospital Stay (HOSPITAL_COMMUNITY)
Admission: EM | Admit: 2018-09-22 | Discharge: 2018-09-26 | DRG: 432 | Disposition: A | Discharge status: Other Acute Inpatient Hospital | Payer: Medicaid Other | Attending: Internal Medicine | Admitting: Internal Medicine

## 2018-09-22 ENCOUNTER — Ambulatory Visit (INDEPENDENT_AMBULATORY_CARE_PROVIDER_SITE_OTHER): Payer: Medicaid Other | Admitting: Acute Care

## 2018-09-22 ENCOUNTER — Encounter (HOSPITAL_COMMUNITY): Payer: Self-pay | Admitting: Internal Medicine

## 2018-09-22 VITALS — BP 118/80 | HR 94 | Ht 66.0 in | Wt 279.4 lb

## 2018-09-22 DIAGNOSIS — D61818 Other pancytopenia: Secondary | ICD-10-CM | POA: Diagnosis present

## 2018-09-22 DIAGNOSIS — R0602 Shortness of breath: Secondary | ICD-10-CM

## 2018-09-22 DIAGNOSIS — K766 Portal hypertension: Secondary | ICD-10-CM | POA: Diagnosis present

## 2018-09-22 DIAGNOSIS — K2950 Unspecified chronic gastritis without bleeding: Secondary | ICD-10-CM | POA: Diagnosis present

## 2018-09-22 DIAGNOSIS — J9601 Acute respiratory failure with hypoxia: Secondary | ICD-10-CM | POA: Diagnosis present

## 2018-09-22 DIAGNOSIS — R0609 Other forms of dyspnea: Secondary | ICD-10-CM

## 2018-09-22 DIAGNOSIS — E039 Hypothyroidism, unspecified: Secondary | ICD-10-CM | POA: Diagnosis present

## 2018-09-22 DIAGNOSIS — Z87891 Personal history of nicotine dependence: Secondary | ICD-10-CM | POA: Diagnosis not present

## 2018-09-22 DIAGNOSIS — R188 Other ascites: Secondary | ICD-10-CM | POA: Diagnosis present

## 2018-09-22 DIAGNOSIS — E119 Type 2 diabetes mellitus without complications: Secondary | ICD-10-CM | POA: Diagnosis present

## 2018-09-22 DIAGNOSIS — K3189 Other diseases of stomach and duodenum: Secondary | ICD-10-CM | POA: Diagnosis present

## 2018-09-22 DIAGNOSIS — Z79890 Hormone replacement therapy (postmenopausal): Secondary | ICD-10-CM | POA: Diagnosis not present

## 2018-09-22 DIAGNOSIS — Z6839 Body mass index (BMI) 39.0-39.9, adult: Secondary | ICD-10-CM | POA: Diagnosis not present

## 2018-09-22 DIAGNOSIS — Z7951 Long term (current) use of inhaled steroids: Secondary | ICD-10-CM | POA: Diagnosis not present

## 2018-09-22 DIAGNOSIS — J302 Other seasonal allergic rhinitis: Secondary | ICD-10-CM | POA: Diagnosis present

## 2018-09-22 DIAGNOSIS — K743 Primary biliary cirrhosis: Secondary | ICD-10-CM

## 2018-09-22 DIAGNOSIS — I864 Gastric varices: Secondary | ICD-10-CM | POA: Diagnosis present

## 2018-09-22 DIAGNOSIS — E669 Obesity, unspecified: Secondary | ICD-10-CM | POA: Diagnosis present

## 2018-09-22 DIAGNOSIS — Z886 Allergy status to analgesic agent status: Secondary | ICD-10-CM

## 2018-09-22 DIAGNOSIS — K219 Gastro-esophageal reflux disease without esophagitis: Secondary | ICD-10-CM | POA: Diagnosis present

## 2018-09-22 DIAGNOSIS — R609 Edema, unspecified: Secondary | ICD-10-CM

## 2018-09-22 DIAGNOSIS — J84112 Idiopathic pulmonary fibrosis: Secondary | ICD-10-CM | POA: Diagnosis present

## 2018-09-22 DIAGNOSIS — D649 Anemia, unspecified: Secondary | ICD-10-CM | POA: Diagnosis present

## 2018-09-22 DIAGNOSIS — D696 Thrombocytopenia, unspecified: Secondary | ICD-10-CM | POA: Diagnosis present

## 2018-09-22 DIAGNOSIS — J849 Interstitial pulmonary disease, unspecified: Secondary | ICD-10-CM

## 2018-09-22 DIAGNOSIS — D72819 Decreased white blood cell count, unspecified: Secondary | ICD-10-CM | POA: Diagnosis present

## 2018-09-22 DIAGNOSIS — Z794 Long term (current) use of insulin: Secondary | ICD-10-CM | POA: Diagnosis not present

## 2018-09-22 DIAGNOSIS — K7460 Unspecified cirrhosis of liver: Secondary | ICD-10-CM

## 2018-09-22 DIAGNOSIS — Z79899 Other long term (current) drug therapy: Secondary | ICD-10-CM

## 2018-09-22 DIAGNOSIS — Z20828 Contact with and (suspected) exposure to other viral communicable diseases: Secondary | ICD-10-CM | POA: Diagnosis present

## 2018-09-22 DIAGNOSIS — Z8719 Personal history of other diseases of the digestive system: Secondary | ICD-10-CM | POA: Diagnosis not present

## 2018-09-22 DIAGNOSIS — D7589 Other specified diseases of blood and blood-forming organs: Secondary | ICD-10-CM | POA: Diagnosis present

## 2018-09-22 HISTORY — DX: Acute respiratory failure with hypoxia: J96.01

## 2018-09-22 LAB — HEPATIC FUNCTION PANEL
ALT: 38 U/L — ABNORMAL HIGH (ref 0–35)
AST: 36 U/L (ref 0–37)
Albumin: 3 g/dL — ABNORMAL LOW (ref 3.5–5.2)
Alkaline Phosphatase: 165 U/L — ABNORMAL HIGH (ref 39–117)
Bilirubin, Direct: 0.6 mg/dL — ABNORMAL HIGH (ref 0.0–0.3)
Total Bilirubin: 1.6 mg/dL — ABNORMAL HIGH (ref 0.2–1.2)
Total Protein: 6.3 g/dL (ref 6.0–8.3)

## 2018-09-22 LAB — CBC
HCT: 25.5 % — ABNORMAL LOW (ref 36.0–46.0)
Hemoglobin: 7.7 g/dL — ABNORMAL LOW (ref 12.0–15.0)
MCH: 26.5 pg (ref 26.0–34.0)
MCHC: 30.2 g/dL (ref 30.0–36.0)
MCV: 87.6 fL (ref 80.0–100.0)
Platelets: 79 10*3/uL — ABNORMAL LOW (ref 150–400)
RBC: 2.91 MIL/uL — ABNORMAL LOW (ref 3.87–5.11)
RDW: 15.4 % (ref 11.5–15.5)
WBC: 2 10*3/uL — ABNORMAL LOW (ref 4.0–10.5)
nRBC: 1 % — ABNORMAL HIGH (ref 0.0–0.2)

## 2018-09-22 LAB — CBC WITH DIFFERENTIAL/PLATELET
Basophils Absolute: 0 10*3/uL (ref 0.0–0.1)
Basophils Relative: 1.4 % (ref 0.0–3.0)
Eosinophils Absolute: 0 10*3/uL (ref 0.0–0.7)
Eosinophils Relative: 2.2 % (ref 0.0–5.0)
HCT: 23.6 % — CL (ref 36.0–46.0)
Hemoglobin: 7.7 g/dL — CL (ref 12.0–15.0)
Lymphocytes Relative: 17.5 % (ref 12.0–46.0)
Lymphs Abs: 0.3 10*3/uL — ABNORMAL LOW (ref 0.7–4.0)
MCHC: 32.5 g/dL (ref 30.0–36.0)
MCV: 81.4 fl (ref 78.0–100.0)
Monocytes Absolute: 0.2 10*3/uL (ref 0.1–1.0)
Monocytes Relative: 9.3 % (ref 3.0–12.0)
Neutro Abs: 1.2 10*3/uL — ABNORMAL LOW (ref 1.4–7.7)
Neutrophils Relative %: 69.6 % (ref 43.0–77.0)
Platelets: 67 10*3/uL — ABNORMAL LOW (ref 150.0–400.0)
RBC: 2.9 Mil/uL — ABNORMAL LOW (ref 3.87–5.11)
RDW: 16.6 % — ABNORMAL HIGH (ref 11.5–15.5)
WBC: 1.7 10*3/uL — CL (ref 4.0–10.5)

## 2018-09-22 LAB — BASIC METABOLIC PANEL
Anion gap: 10 (ref 5–15)
BUN: 10 mg/dL (ref 6–23)
BUN: 9 mg/dL (ref 6–20)
CO2: 22 mEq/L (ref 19–32)
CO2: 19 mmol/L — ABNORMAL LOW (ref 22–32)
Calcium: 8.2 mg/dL — ABNORMAL LOW (ref 8.4–10.5)
Calcium: 8.6 mg/dL — ABNORMAL LOW (ref 8.9–10.3)
Chloride: 110 mEq/L (ref 96–112)
Chloride: 111 mmol/L (ref 98–111)
Creatinine, Ser: 0.63 mg/dL (ref 0.40–1.20)
Creatinine, Ser: 0.74 mg/dL (ref 0.44–1.00)
GFR calc Af Amer: >60 mL/min (ref >60)
GFR calc non Af Amer: >60 mL/min (ref >60)
GFR: 98.64 mL/min (ref >60.00)
Glucose, Bld: 227 mg/dL — ABNORMAL HIGH (ref 70–99)
Glucose, Bld: 243 mg/dL — ABNORMAL HIGH (ref 70–99)
Potassium: 3.9 mEq/L (ref 3.5–5.1)
Potassium: 4 mmol/L (ref 3.5–5.1)
Sodium: 140 mEq/L (ref 135–145)
Sodium: 140 mmol/L (ref 135–145)

## 2018-09-22 LAB — SARS CORONAVIRUS 2 BY RT PCR (HOSPITAL ORDER, PERFORMED IN ~~LOC~~ HOSPITAL LAB): SARS Coronavirus 2: NEGATIVE

## 2018-09-22 LAB — BRAIN NATRIURETIC PEPTIDE: Pro B Natriuretic peptide (BNP): 35 pg/mL (ref 0.0–100.0)

## 2018-09-22 LAB — GLUCOSE, CAPILLARY: Glucose-Capillary: 116 mg/dL — ABNORMAL HIGH (ref 70–99)

## 2018-09-22 LAB — D-DIMER, QUANTITATIVE: D-Dimer, Quant: 4.43 mcg/mL FEU — ABNORMAL HIGH (ref <0.50)

## 2018-09-22 MED ORDER — SODIUM CHLORIDE 0.9 % IV SOLN
1.0000 g | INTRAVENOUS | Status: DC
  Administered 2018-09-23 – 2018-09-25 (×4): 1 g via INTRAVENOUS
  Filled 2018-09-22 (×4): qty 10

## 2018-09-22 MED ORDER — INSULIN GLARGINE 100 UNIT/ML ~~LOC~~ SOLN
25.0000 [IU] | Freq: Every day | SUBCUTANEOUS | Status: DC
  Administered 2018-09-23 – 2018-09-25 (×4): 25 [IU] via SUBCUTANEOUS
  Filled 2018-09-22 (×5): qty 0.25

## 2018-09-22 MED ORDER — ONDANSETRON HCL 4 MG PO TABS: 4.0000 mg | ORAL_TABLET | Freq: Four times a day (QID) | ORAL | Status: DC | PRN

## 2018-09-22 MED ORDER — FUROSEMIDE 40 MG PO TABS
60.0000 mg | ORAL_TABLET | Freq: Two times a day (BID) | ORAL | Status: DC
  Administered 2018-09-23 – 2018-09-26 (×8): 60 mg via ORAL
  Filled 2018-09-22 (×8): qty 1

## 2018-09-22 MED ORDER — ONDANSETRON HCL 4 MG/2ML IJ SOLN
4.0000 mg | Freq: Four times a day (QID) | INTRAMUSCULAR | Status: DC | PRN
  Administered 2018-09-25: 4 mg via INTRAVENOUS
  Filled 2018-09-22: qty 2

## 2018-09-22 MED ORDER — LEVOTHYROXINE SODIUM 50 MCG PO TABS
250.0000 ug | ORAL_TABLET | Freq: Every day | ORAL | Status: DC
  Administered 2018-09-23 – 2018-09-25 (×3): 250 ug via ORAL
  Filled 2018-09-22 (×3): qty 1

## 2018-09-22 MED ORDER — FUROSEMIDE 10 MG/ML IJ SOLN
40.0000 mg | Freq: Once | INTRAMUSCULAR | Status: AC
  Administered 2018-09-22: 40 mg via INTRAVENOUS
  Filled 2018-09-22: qty 4

## 2018-09-22 MED ORDER — PANTOPRAZOLE SODIUM 40 MG PO TBEC: 40.0000 mg | DELAYED_RELEASE_TABLET | Freq: Two times a day (BID) | ORAL | Status: DC

## 2018-09-22 MED ORDER — ALBUTEROL SULFATE (2.5 MG/3ML) 0.083% IN NEBU: 3.0000 mL | INHALATION_SOLUTION | RESPIRATORY_TRACT | Status: DC | PRN

## 2018-09-22 MED ORDER — IOHEXOL 300 MG/ML  SOLN
100.0000 mL | Freq: Once | INTRAMUSCULAR | Status: AC | PRN
  Administered 2018-09-22: 100 mL via INTRAVENOUS

## 2018-09-22 MED ORDER — LORATADINE 10 MG PO TABS
10.0000 mg | ORAL_TABLET | Freq: Every day | ORAL | Status: DC
  Administered 2018-09-23 – 2018-09-26 (×4): 10 mg via ORAL
  Filled 2018-09-22 (×4): qty 1

## 2018-09-22 MED ORDER — PREDNISONE 10 MG PO TABS
10.0000 mg | ORAL_TABLET | Freq: Every day | ORAL | Status: DC
  Administered 2018-09-23 – 2018-09-26 (×4): 10 mg via ORAL
  Filled 2018-09-22 (×4): qty 1

## 2018-09-22 MED ORDER — INSULIN ASPART 100 UNIT/ML ~~LOC~~ SOLN: 0.0000 [IU] | Freq: Three times a day (TID) | SUBCUTANEOUS | Status: DC

## 2018-09-22 NOTE — ED Notes (Signed)
Pt's brother, Sonia Side called this RN for an update. Pt's brother thankful for the information.

## 2018-09-22 NOTE — Assessment & Plan Note (Addendum)
UIP per CT ? Flare Plan CXR Refer to the ED at [facility]

## 2018-09-22 NOTE — Progress Notes (Signed)
History of Present Illness Holly Mueller is a 52 y.o. female former smoker ( Quit 1997) with idiopathic pulmonary fibrosis versus NSIP versus interstitial lung disease with autoimmune features. diabetes mellitus, GERD, hypothyroidism, liver cirrhosis without ascites, pancytopenia, Hx of  hemoptysis, and dyspnea with  cough. She is followed by Dr. Chase Caller. Maintenance Regimen: Dulera  Albuterol prn ( Nebs and inhaler)   09/22/2018  Pt presents for follow up for worsening shortness of breath and fatigue over the last week with lower extremity edema, and weight gain of 34 pounds in 9 days. She states she has been using her rescue inhaler every 4 hours daily, and her nebs twice daily.She has been compliant with her Dulera . She was in the hospital recently for hemoptysis with worsening shortness of breath. She was treated with oxygen, steroids and cough supression. Auto immune labs were drawn . CTA was + for fibrosis, most likely idiopathic pulmonary fibrosis versus NSIP versus interstitial lung disease with autoimmune features. She was told to follow up with Pulmonary for further outpatient work up.Her hepatic function may be a barrier to fibrotic therapy.  She presents today in a wheelchair with severe dyspnea disproportionate to her saturations, which are 97% on RA. Marland KitchenShe states nothing resolves her dyspnea. She denies any bleeding dark stools.She denies any fever or contact with COVID positive persons. She is in significant distress. I am concerned that this is multifactorial pulmonary vascular edema and anemia which may require transfusion and acute care management best provided as an inpatient at the hospital setting. Vitals and oxygen saturation are   currently stable for transition to the ED at [facility]. Her brother is here, and states he will transport her there.   Test Results: CXR Fairly diffuse interstitial prominence which is felt to be due primarily to interstitial fibrosis. There is not  felt to be cardiogenic edema. A mild degree of noncardiogenic edema superimposed is possible. Note that viral type pneumonitis may present in this manner. There is no airspace consolidation. Heart size normal. No adenopathy.  PFT's March 2020 with FVC 1.87 L / 50% with a DLCO 15.56/69%.  COVID-19 negative CTA chest 4/19 > Respiratory motion artifact and suboptimal opacification of the pulmonary arteries. No central or proximal segmental pulmonary embolus. No acute pulmonary process. Findings of pulmonary fibrosis are stable from prior high-resolution chest CT. LE doppler 4/19 > There is no evidence of deep vein thrombosis in the lower extremites. No cystic structure found in the popliteal fossa. Echo 2/20.2019 Left ventricle: The cavity size was normal. Wall thickness was   normal. Systolic function was normal. The estimated ejection   fraction was in the range of 60% to 65%. Wall motion was normal;   there were no regional wall motion abnormalities. Doppler   parameters are consistent with abnormal left ventricular   relaxation (grade 1 diastolic dysfunction). - Aortic valve: Trileaflet; mildly thickened, mildly calcified   leaflets. - Mitral valve: There was trivial regurgitation. - Left atrium: The atrium was mildly dilated. HRCT 04/05/2018 The appearance of the lungs is compatible with interstitial lung disease, with a spectrum of findings categorized as probable usual interstitial pneumonia (UIP) based on current ATS guidelines. Repeat high-resolution chest CT is suggested in 12 months to assess for temporal changes in the appearance of the lung parenchyma. Aortic atherosclerosis. Cirrhosis with stigmata of portal venous hypertension, including splenomegaly.   CBC Latest Ref Rng & Units 09/22/2018 09/22/2018 09/13/2018  WBC 4.0 - 10.5 K/uL 2.0(L) 1.7 Repeated and verified  X2.(LL) 3.1(L)  Hemoglobin 12.0 - 15.0 g/dL 7.7(L) 7.7 Repeated and verified X2.(LL) 8.5(L)  Hematocrit 36.0  - 46.0 % 25.5(L) 23.6 Repeated and verified X2.(LL) 26.1(L)  Platelets 150 - 400 K/uL 79(L) 67.0 Repeated and verified X2.(L) 64(LL)    BMP Latest Ref Rng & Units 09/22/2018 09/22/2018 09/02/2018  Glucose 70 - 99 mg/dL 243(H) 227(H) 312(H)  BUN 6 - 20 mg/dL _0 Creatinine 0.44 - 1.00 mg/dL 0.74 0.63 0.69  BUN/Creat Ratio 9 - 23 - - -  Sodium 135 - 145 mmol/L 140 140 133(L)  Potassium 3.5 - 5.1 mmol/L 4.0 3.9 4.0  Chloride 98 - 111 mmol/L 111 110 103  CO2 22 - 32 mmol/L 19(L) 22 24  Calcium 8.9 - 10.3 mg/dL 8.6(L) 8.2(L) 8.1(L)    BNP    Component Value Date/Time   BNP 9.3 08/28/2018 0646    ProBNP    Component Value Date/Time   PROBNP 35.0 09/22/2018 1420    PFT    Component Value Date/Time   FEV1PRE 1.58 07/12/2018 1358   FEV1POST 1.52 11/13/2016 1121   FVCPRE 1.87 07/12/2018 1358   FVCPOST 1.84 11/13/2016 1121   DLCOUNC 14.68 07/12/2018 1358   PREFEV1FVCRT 84 07/12/2018 1358   PSTFEV1FVCRT 82 11/13/2016 1121    Dg Chest 2 View  Result Date: 09/22/2018 CLINICAL DATA:  Shortness of breath EXAM: CHEST - 2 VIEW COMPARISON:  Chest radiograph August 27, 2018 and chest CT August 28, 2018 FINDINGS: There remains generalized interstitial prominence, largely due to pulmonary fibrosis. No airspace consolidation. Heart size and pulmonary vascularity are normal. No adenopathy. No bone lesions. IMPRESSION: Fairly diffuse interstitial prominence which is felt to be due primarily to interstitial fibrosis. There is not felt to be cardiogenic edema. A mild degree of noncardiogenic edema superimposed is possible. Note that viral type pneumonitis may present in this manner. There is no airspace consolidation. Heart size normal. No adenopathy. Electronically Signed   By: Lowella Grip III M.D.   On: 09/22/2018 14:31   Ct Angio Chest Pe W Or Wo Contrast  Result Date: 08/28/2018 CLINICAL DATA:  53 y/o  F; chest pain and dyspnea. EXAM: CT ANGIOGRAPHY CHEST WITH CONTRAST TECHNIQUE:  Multidetector CT imaging of the chest was performed using the standard protocol during bolus administration of intravenous contrast. Multiplanar CT image reconstructions and MIPs were obtained to evaluate the vascular anatomy. CONTRAST:  123m OMNIPAQUE IOHEXOL 350 MG/ML SOLN COMPARISON:  04/05/2018 CT chest. 08/27/2018 chest radiograph. FINDINGS: Cardiovascular: Preferential opacification of the thoracic aorta. No evidence of thoracic aortic aneurysm or dissection. Normal heart size. No pericardial effusion. Respiratory motion artifact and suboptimal opacification of the pulmonary arteries. No central or proximal segmental pulmonary embolus. Mediastinum/Nodes: Heterogeneous and mildly enlarged thyroid gland. Normal esophagus and trachea. No axillary or mediastinal lymphadenopathy. Lungs/Pleura: Pulmonary fibrosis with peripheral and basilar predominance stable from prior high-resolution chest CT. No consolidation, effusion, or pneumothorax. Upper Abdomen: No acute abnormality. Musculoskeletal: No chest wall abnormality. No acute or significant osseous findings. Review of the MIP images confirms the above findings. IMPRESSION: 1. Respiratory motion artifact and suboptimal opacification of the pulmonary arteries. No central or proximal segmental pulmonary embolus. No acute pulmonary process. 2. Findings of pulmonary fibrosis are stable from prior high-resolution chest CT. Electronically Signed   By: LKristine GarbeM.D.   On: 08/28/2018 04:17   Dg Chest Port 1 View  Result Date: 08/28/2018 CLINICAL DATA:  53y/o  F; cough, shortness of breath. EXAM: PORTABLE CHEST  1 VIEW COMPARISON:  07/05/2018 chest radiograph. FINDINGS: Stable cardiac silhouette given projection and technique. Low lung volumes. Stable coarse reticular opacities compatible with interstitial lung disease. No new consolidation, effusion, or pneumothorax identified. No acute osseous abnormality is evident. IMPRESSION: Stable low lung  volumes and findings of interstitial lung disease. No acute pulmonary process identified. Electronically Signed   By: Kristine Garbe M.D.   On: 08/28/2018 00:21   Dg Abd Portable 1v  Result Date: 08/29/2018 CLINICAL DATA:  Diffuse abd pain and vomiting EXAM: PORTABLE ABDOMEN - 1 VIEW COMPARISON:  CT abdomen 05/26/2012 FINDINGS: No dilated loops of large or small bowel. Gas and stool in the rectum. No pathologic calcifications. No organomegaly. No acute osseous abnormality IMPRESSION: No acute abdominal findings. Electronically Signed   By: Suzy Bouchard M.D.   On: 08/29/2018 11:51   Vas [place] Lower Extremity Venous (dvt)  Result Date: 08/28/2018  Lower Venous Study Indications: SOB.  Risk Factors: Obesity. Performing Technologist: Toma Copier RVS  Examination Guidelines: A complete evaluation includes B-mode imaging, spectral Doppler, color Doppler, and power Doppler as needed of all accessible portions of each vessel. Bilateral testing is considered an integral part of a complete examination. Limited examinations for reoccurring indications may be performed as noted.  +---------+---------------+---------+-----------+----------+------------------+  RIGHT     Compressibility Phasicity Spontaneity Properties Summary             +---------+---------------+---------+-----------+----------+------------------+  CFV       Full            Yes       Yes                                        +---------+---------------+---------+-----------+----------+------------------+  SFJ       Full                                                                 +---------+---------------+---------+-----------+----------+------------------+  FV Prox   Full            Yes       Yes                                        +---------+---------------+---------+-----------+----------+------------------+  FV Mid    Full                                                                  +---------+---------------+---------+-----------+----------+------------------+  FV Distal Full            Yes       Yes                                        +---------+---------------+---------+-----------+----------+------------------+  PFV       Full  Yes       Yes                                        +---------+---------------+---------+-----------+----------+------------------+  POP       Full            Yes       Yes                                        +---------+---------------+---------+-----------+----------+------------------+  PTV       Full                                                                 +---------+---------------+---------+-----------+----------+------------------+  PERO      Full                                             Difficult to image  +---------+---------------+---------+-----------+----------+------------------+   +---------+---------------+---------+-----------+----------+------------------+  LEFT      Compressibility Phasicity Spontaneity Properties Summary             +---------+---------------+---------+-----------+----------+------------------+  CFV       Full            Yes       Yes                                        +---------+---------------+---------+-----------+----------+------------------+  SFJ       Full                                                                 +---------+---------------+---------+-----------+----------+------------------+  FV Prox   Full            Yes       Yes                                        +---------+---------------+---------+-----------+----------+------------------+  FV Mid    Full                                                                 +---------+---------------+---------+-----------+----------+------------------+  FV Distal Full            Yes       Yes                                        +---------+---------------+---------+-----------+----------+------------------+  PFV       Full             Yes       Yes                                        +---------+---------------+---------+-----------+----------+------------------+  POP       Full            Yes       Yes                                        +---------+---------------+---------+-----------+----------+------------------+  PTV       Full                                             Difficult to image  +---------+---------------+---------+-----------+----------+------------------+  PERO      Full                                             Difficult to image  +---------+---------------+---------+-----------+----------+------------------+     Summary: Right: There is no evidence of deep vein thrombosis in the lower extremity. No cystic structure found in the popliteal fossa. Left: There is no evidence of deep vein thrombosis in the lower extremity. No cystic structure found in the popliteal fossa.  *See table(s) above for measurements and observations. Electronically signed by Servando Snare MD on 08/28/2018 at 12:35:40 PM.    Final      Past medical hx Past Medical History:  Diagnosis Date   Arthritis    bursitis left hip flares-not an issue   Diabetes mellitus without complication ([place])    Edentulous    10-19-13 at present   Epilepsy ([place]) in 1972   No seizures since 1972. Previously treated with phenobarbital.    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 Her smoking use included cigarettes. She smoked 0.25 packs per day. She has never used smokeless tobacco. She reports previous alcohol use. She reports that she does not use drugs.  Tobacco Cessation: Former smoker , Quit 1997  Past surgical hx, Family hx, Social hx all reviewed.  Current Outpatient Medications on File Prior to Visit  Medication Sig   acetaminophen (TYLENOL) 500 MG tablet Take 1,000 mg by mouth every 8 (eight) hours as needed (pain).   albuterol (PROAIR HFA)  108 (90 Base) MCG/ACT inhaler INHALE 2 PUFFS INTO THE LUNGS EVERY 6 HOURS AS NEEDED FOR SHORTNESS OF BREATH (Patient taking differently: Inhale 1-2 puffs into the lungs every 6 (six) hours as needed for wheezing or shortness of breath. )   albuterol (PROVENTIL) (2.5 MG/3ML) 0.083% nebulizer solution Take 3 mLs (2.5 mg total) by nebulization every 6 (six) hours as needed for wheezing or shortness of breath.   blood glucose meter kit and supplies KIT Dispense based on patient and insurance preference. Use up to four times daily as directed. (FOR ICD-9 250.00, 250.01).   blood glucose meter kit and supplies Dispense based on patient and insurance preference. Use up to four times daily as directed. (FOR ICD-10 E10.9, E11.9).   cetirizine (ZYRTEC) 10 MG tablet Take 1 tablet (10 mg total) by mouth daily.   fluticasone (FLONASE) 50 MCG/ACT nasal spray Place 2 sprays into both nostrils daily.   glucose blood (TRUE METRIX BLOOD GLUCOSE TEST) test strip Use as instructed   ibuprofen (ADVIL,MOTRIN) 200 MG tablet Take 600 mg by mouth every 6 (six) hours as needed for moderate pain.   insulin aspart (NOVOLOG) 100 UNIT/ML injection Use 4 times daily with meals and before bed to administer using sliding scale provided   insulin glargine (LANTUS) 100 UNIT/ML injection Inject 0.25 mLs (25 Units total) into the skin at bedtime.   Insulin Syringe-Needle U-100 (INSULIN SYRINGE .3CC/31GX5/16") 31G X 5/16"  0.3 ML MISC Use with lantus and novolog insulin   levothyroxine (SYNTHROID, LEVOTHROID) 125 MCG tablet Take 2 tablets (250 mcg total) by mouth daily.   metFORMIN (GLUCOPHAGE) 500 MG tablet Take 1 tablet (500 mg total) by mouth 2 (two) times daily with a meal.   Mometasone Furoate (ASMANEX HFA) 200 MCG/ACT AERO Inhale 2 puffs into the lungs 2 (two) times daily.   pantoprazole (PROTONIX) 40 MG tablet Take 1 tablet (40 mg total) by mouth 2 (two) times daily before a meal.   predniSONE (DELTASONE) 10 MG tablet Take 4 tabs for 7 days, then 3 tabs for 7 days, then 2 tabs for 7 days, then 1 tab for 7 days, then 1/2 tab for 7 days.   TRUEplus Lancets 28G MISC Use to check blood sugar as directed.   No current facility-administered medications on file prior to visit.      Allergies  Allergen Reactions   Aspirin Nausea Only    Upset stomach    Review Of Systems:  Constitutional:   No  weight loss, night sweats,  Fevers, chills, fatigue, or  Lassitude. + weight gain  HEENT:   No headaches,  Difficulty swallowing,  Tooth/dental problems, or  Sore throat,  No sneezing, itching, ear ache, nasal congestion, post nasal drip,   CV:  No chest pain,  Orthopnea, PND, + swelling in lower extremities, + anasarca, dizziness, palpitations, syncope.   GI  No heartburn, indigestion, abdominal pain, nausea, vomiting, + diarrhea 4 days ago, change in bowel habits, + loss of appetite at times , bloody stools.   Resp: + shortness of breath with exertion or at rest.  No excess mucus, no productive cough,  No non-productive cough,  No coughing up of blood.  No change in color of mucus.  + wheezing.  No chest wall deformity  Skin: no rash or lesions.   GU: no dysuria, change in color of urine, no urgency or frequency.  No flank pain, no hematuria   MS:  + joint pain or swelling.  No decreased range  of motion.  + back pain.  Psych:  No change in mood or affect. No depression or anxiety.  No memory  loss.   Vital Signs BP 118/80 (BP Location: Left Arm, Cuff Size: Normal)    Pulse 94    Ht _0  (1.676 m)    Wt 279 lb 6.4 oz (126.7 kg)    LMP  (LMP Unknown)    SpO2 97%    BMI 45.10 kg/m    Physical Exam:  General- No distress,  A&Ox3, pleasant but obviously uncomfortable ENT: No sinus tenderness, TM clear, pale nasal mucosa, no oral exudate,no post nasal drip, no LAN Cardiac: S1, S2, regular rate and rhythm, no murmur Chest: + wheeze/ + rales/ + dullness; + accessory muscle use, no nasal flaring, no sternal retractions Abd.: Soft, distended and non-tender, Body mass index is 45.1 kg/m. Ext: No clubbing cyanosis, + BLE edema Neuro:  Awake and alert and oriented x 3, MAE x 4, severely weak and deconditioned Skin: No rashes, no lesions, warm and dry, pale Psych: normal mood and behavior   Assessment/Plan  DOE (dyspnea on exertion) Dyspnea on exertion disproportionate to oxygen saturations Saturations are 96-97% on RA Weight increase of 34.4 pounds over the last 9 days Increased swelling per LE Increased abdominal girth Cough ILD Concern for anemia Concern for viral pneumonitis Plan CXR  Stat,    BNP,INR,CBC,BMET,D Dimer,LFT's Consider RVP Consider PCT We will refer you to the ED at [facility] for further care and treatment     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 Cirrhosis Plan Consider pelvic Abdominal CT  INR CBC LFT's Refer to ED for immediate care  ILD (interstitial lung disease) ([place]) UIP per CT ? Flare Plan CXR Refer to the ED at [facility]  Anemia Pale Profound Fatigue Recent hospitalization with thrombocytopenia Cirrhosis Plan CBC Transfuse for HGB < 7 Determine source of bleed/ etiology Refer to [facility] ED    Magdalen Spatz, NP 09/22/2018  4:51 PM

## 2018-09-22 NOTE — Assessment & Plan Note (Addendum)
Dyspnea on exertion disproportionate to oxygen saturations Saturations are 96-97% on RA Weight increase of 34.4 pounds over the last 9 days Increased swelling per LE Increased abdominal girth Cough ILD Concern for anemia Concern for viral pneumonitis Plan CXR  Stat,    BNP,INR,CBC,BMET,D Dimer,LFT's Consider RVP Consider PCT We will refer you to the ED at [facility] for further care and treatment

## 2018-09-22 NOTE — H&P (Signed)
History and Physical    Holly Mueller LPF:[id number] DOB: 06/20/1965 DOA: 09/22/2018  PCP: [name], MD  Patient coming from: Home.  Chief Complaint: Shortness of breath.  HPI: Holly Mueller is a 53 y.o. female with history of interstitial lung disease followed by Dr. milligrams family was recently admitted for hemoptysis was placed on prednisone which patient is on a tapering dose with history of primary biliary cholangitis with cirrhosis, anemia, diabetes mellitus, hypothyroidism since discharge home has been experiencing increasing shortness of breath over the last 9 to 10 days.  Shortness of breath on minimal exertion.  Denies any chest pain has been in chronic cough.  Denies fever chills.  Patient noticed increasing abdominal girth lower extremity edema and a weight gain of around 30 pounds.  Patient had followed up with pulmonology clinic yesterday and was referred to the ER.  ED Course: In the ER patient's d-dimer was found to be elevated for which patient underwent CT angiogram of the chest which was negative for PE did show interstitial lung disease features and moderate ascites.  Moderate ascites appears to be new for the patient.  In addition patient was complaining of some left flank pain which has been ongoing for last few weeks.  Denies nausea vomiting had few episodes of diarrhea last week which is resolved.  On exam patient abdomen is distended nontender.  Patient was given Lasix 40 in the ER and admitted for further management of acute respiratory failure likely a combination of possible developing decompensated liver cirrhosis and interstitial lung disease.  Patient has anemia with hemoglobin is slightly lower than the baseline.  Patient also has known history of leukopenia and thrombocytopenia.  Review of Systems: As per HPI, rest all negative.   Past Medical History:  Diagnosis Date   Arthritis    bursitis left hip flares-not an issue   Diabetes mellitus  without complication ([place])    Edentulous    10-19-13 at present   Epilepsy ([place]) in 1972   No seizures since 1972. Previously treated with phenobarbital.    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 ([place])    Hypothyroidism 2008   Lower esophageal ring 08/18/2013   Seasonal allergies 2003   Shortness of breath     Past Surgical History:  Procedure Laterality Date   BALLOON DILATION N/A 10/24/2013   Procedure: BALLOON DILATION;  Surgeon: Gatha Mayer, MD;  Location: [facility];  Service: Endoscopy;  Laterality: N/A;   CESAREAN SECTION     x2   DENTAL SURGERY     multiple extractions 3'15   ESOPHAGOGASTRODUODENOSCOPY N/A 10/24/2013   Procedure: ESOPHAGOGASTRODUODENOSCOPY (EGD);  Surgeon: Gatha Mayer, MD;  Location: [facility];  Service: Endoscopy;  Laterality: N/A;   LIVER BIOPSY  06/30/2012   Procedure: LIVER BIOPSY;  Surgeon: Shann Medal, MD;  Location: [facility];  Service: General;;   NOVASURE ABLATION     SHOULDER ARTHROSCOPY Right 5329   UMBILICAL HERNIA REPAIR N/A 06/30/2012   Procedure: remove umbilicus;  Surgeon: Shann Medal, MD;  Location: [facility];  Service: General;  Laterality: N/A;   VENTRAL HERNIA REPAIR N/A 06/30/2012   Procedure: LAPAROSCOPIC VENTRAL HERNIA;  Surgeon: Shann Medal, MD;  Location: [facility];  Service: General;  Laterality: N/A;  With Mesh   VIDEO BRONCHOSCOPY Bilateral 07/20/2018   Procedure: VIDEO BRONCHOSCOPY WITHOUT FLUORO;  Surgeon: Brand Males,  MD;  Location: [facility];  Service: Cardiopulmonary;  Laterality: Bilateral;   WISDOM TOOTH EXTRACTION       reports that she quit smoking about 23 years ago. Her smoking use included cigarettes. She smoked 0.25 packs per day. She has never used smokeless tobacco. She reports previous alcohol use. She reports that she does not use drugs.  Allergies  Allergen Reactions   Aspirin  Nausea Only    Upset stomach    Family History  Problem Relation Age of Onset   Hypertension Mother    Diabetes Mother    Cirrhosis Mother        ? medications   Lung cancer Father    Stroke Father    Diabetes Sister    Hypertension Sister    Colon cancer Neg Hx    Esophageal cancer Neg Hx    Rectal cancer Neg Hx     Prior to Admission medications   Medication Sig Start Date End Date Taking? Authorizing Provider  acetaminophen (TYLENOL) 500 MG tablet Take 1,000 mg by mouth every 8 (eight) hours as needed (pain).   Yes [provider]  albuterol (PROAIR HFA) 108 (90 Base) MCG/ACT inhaler INHALE 2 PUFFS INTO THE LUNGS EVERY 6 HOURS AS NEEDED FOR SHORTNESS OF BREATH Patient taking differently: Inhale 2 puffs into the lungs every 4 (four) hours as needed for wheezing or shortness of breath.  04/25/18  Yes Rory Percy, DO  albuterol (PROVENTIL) (2.5 MG/3ML) 0.083% nebulizer solution Take 3 mLs (2.5 mg total) by nebulization every 6 (six) hours as needed for wheezing or shortness of breath. 04/25/18  Yes Rory Percy, DO  cetirizine (ZYRTEC) 10 MG tablet Take 1 tablet (10 mg total) by mouth daily. 01/27/17  Yes [name], MD  fluticasone [facility]) 50 MCG/ACT nasal spray Place 2 sprays into both nostrils daily.   Yes [provider]  ibuprofen (ADVIL,MOTRIN) 200 MG tablet Take 600-800 mg by mouth every 6 (six) hours as needed for moderate pain.    Yes [provider]  insulin aspart (NOVOLOG) 100 UNIT/ML injection Use 4 times daily with meals and before bed to administer using sliding scale provided Patient taking differently: Inject 9-25 Units into the skin See admin instructions. Inject 9-25 units into the skin three times a day with meals, per sliding scale 09/02/18 09/02/19 Yes Dessa Phi, DO  insulin glargine (LANTUS) 100 UNIT/ML injection Inject 0.25 mLs (25 Units total) into the skin at bedtime. 09/02/18  Yes Dessa Phi, DO    levothyroxine (SYNTHROID, LEVOTHROID) 125 MCG tablet Take 2 tablets (250 mcg total) by mouth daily. 07/26/18  Yes [name], MD  metFORMIN (GLUCOPHAGE) 500 MG tablet Take 1 tablet (500 mg total) by mouth 2 (two) times daily with a meal. Patient taking differently: Take 1,000 mg by mouth 2 (two) times daily with a meal.  07/26/18  Yes [name], MD  Mometasone Furoate [facility] HFA) 200 MCG/ACT AERO Inhale 2 puffs into the lungs 2 (two) times daily. 05/25/18  Yes Brand Males, MD  pantoprazole (PROTONIX) 40 MG tablet Take 1 tablet (40 mg total) by mouth 2 (two) times daily before a meal. 07/26/18  Yes [name], MD  predniSONE (DELTASONE) 10 MG tablet Take 4 tabs for 7 days, then 3 tabs for 7 days, then 2 tabs for 7 days, then 1 tab for 7 days, then 1/2 tab for 7 days. Patient taking differently: Take 5-40 mg by mouth See admin instructions. Take 40 mg by mouth once a  day for 7 days, 30 mg once a day for 7 days, 20 mg once a day for 7 days, 10 mg once a day for 7 days, then 5 mg once a day for 7 days as directed 09/02/18  Yes Dessa Phi, DO  blood glucose meter kit and supplies KIT Dispense based on patient and insurance preference. Use up to four times daily as directed. (FOR ICD-9 250.00, 250.01). 06/19/17   Rory Percy, DO  blood glucose meter kit and supplies Dispense based on patient and insurance preference. Use up to four times daily as directed. (FOR ICD-10 E10.9, E11.9). 09/02/18   Dessa Phi, DO  glucose blood (TRUE METRIX BLOOD GLUCOSE TEST) test strip Use as instructed 09/16/18   [name], MD  Insulin Syringe-Needle U-100 (INSULIN SYRINGE .3CC/31GX5/16") 31G X 5/16" 0.3 ML MISC Use with lantus and novolog insulin 09/02/18   Dessa Phi, DO  TRUEplus Lancets 28G MISC Use to check blood sugar as directed. 09/16/18   [name], MD    Physical Exam: Vitals:   09/22/18 2000 09/22/18 2030 09/22/18 2100 09/22/18 2154  BP: 135/80 130/79  127/82 121/82  Pulse: 89 97 100 99  Resp: (!) 23 20 (!) 25 17  Temp:    98.4 F (36.9 C)  TempSrc:    Oral  SpO2: 98% 100% 97% 98%  Weight:          Constitutional: Moderately built and nourished. Vitals:   09/22/18 2000 09/22/18 2030 09/22/18 2100 09/22/18 2154  BP: 135/80 130/79 127/82 121/82  Pulse: 89 97 100 99  Resp: (!) 23 20 (!) 25 17  Temp:    98.4 F (36.9 C)  TempSrc:    Oral  SpO2: 98% 100% 97% 98%  Weight:       Eyes: Anicteric no pallor. ENMT: No discharge from the ears eyes nose and mouth. Neck: No mass or.  No neck rigidity. Respiratory: No rhonchi mild crepitations. Cardiovascular: S1-S2 heard. Abdomen: Distended nontender bowel sounds present. Musculoskeletal: Mild edema bilateral lower extremity. Skin: No rash. Neurologic: Alert awake oriented to time place and person.  Moves all extremities. Psychiatric: Appears normal per normal affect.   Labs on Admission: I have personally reviewed following labs and imaging studies  CBC: Recent Labs  Lab 09/22/18 1420 09/22/18 1531  WBC 1.7 Repeated and verified X2.* 2.0*  NEUTROABS 1.2*  --   HGB 7.7 Repeated and verified X2.* 7.7*  HCT 23.6 Repeated and verified X2.* 25.5*  MCV 81.4 87.6  PLT 67.0 Repeated and verified X2.* 79*   Basic Metabolic Panel: Recent Labs  Lab 09/22/18 1420 09/22/18 1531  NA 140 140  K 3.9 4.0  CL 110 111  CO2 22 19*  GLUCOSE 227* 243*  BUN 10 9  CREATININE 0.63 0.74  CALCIUM 8.2* 8.6*   GFR: Estimated Creatinine Clearance: 110.8 mL/min (by C-G formula based on SCr of 0.74 mg/dL). Liver Function Tests: Recent Labs  Lab 09/22/18 1420  AST 36  ALT 38*  ALKPHOS 165*  BILITOT 1.6*  PROT 6.3  ALBUMIN 3.0*   No results for input(s): LIPASE, AMYLASE in the last 168 hours. No results for input(s): AMMONIA in the last 168 hours. Coagulation Profile: No results for input(s): INR, PROTIME in the last 168 hours. Cardiac Enzymes: No results for input(s): CKTOTAL,  CKMB, CKMBINDEX, TROPONINI in the last 168 hours. BNP (last 3 results) Recent Labs    09/22/18 1420  PROBNP 35.0   HbA1C: No results for input(s): HGBA1C in  the last 72 hours. CBG: Recent Labs  Lab 09/22/18 2151  GLUCAP 116*   Lipid Profile: No results for input(s): CHOL, HDL, LDLCALC, TRIG, CHOLHDL, LDLDIRECT in the last 72 hours. Thyroid Function Tests: No results for input(s): TSH, T4TOTAL, FREET4, T3FREE, THYROIDAB in the last 72 hours. Anemia Panel: No results for input(s): VITAMINB12, FOLATE, FERRITIN, TIBC, IRON, RETICCTPCT in the last 72 hours. Urine analysis:    Component Value Date/Time   COLORURINE YELLOW 12/28/2017 1950   APPEARANCEUR CLEAR 12/28/2017 1950   LABSPEC 1.012 12/28/2017 1950   PHURINE 7.0 12/28/2017 1950   GLUCOSEU NEGATIVE 12/28/2017 1950   HGBUR SMALL (A) 12/28/2017 1950   BILIRUBINUR NEGATIVE 12/28/2017 1950   KETONESUR NEGATIVE 12/28/2017 1950   PROTEINUR NEGATIVE 12/28/2017 1950   UROBILINOGEN 2.0 (H) 11/22/2013 1743   NITRITE NEGATIVE 12/28/2017 1950   LEUKOCYTESUR NEGATIVE 12/28/2017 1950   Sepsis Labs: _0 (procalcitonin:4,lacticidven:4) ) Recent Results (from the past 240 hour(s))  SARS Coronavirus 2 (CEPHEID - Performed in [place] hospital lab), Hosp Order     Status: None   Collection Time: 09/22/18  8:15 PM  Result Value Ref Range Status   SARS Coronavirus 2 NEGATIVE NEGATIVE Final    Comment: (NOTE) If result is NEGATIVE SARS-CoV-2 target nucleic acids are NOT DETECTED. The SARS-CoV-2 RNA is generally detectable in upper and lower  respiratory specimens during the acute phase of infection. The lowest  concentration of SARS-CoV-2 viral copies this assay can detect is 250  copies / mL. A negative result does not preclude SARS-CoV-2 infection  and should not be used as the sole basis for treatment or other  patient management decisions.  A negative result may occur with  improper specimen collection / handling,  submission of specimen other  than nasopharyngeal swab, presence of viral mutation(s) within the  areas targeted by this assay, and inadequate number of viral copies  (<250 copies / mL). A negative result must be combined with clinical  observations, patient history, and epidemiological information. If result is POSITIVE SARS-CoV-2 target nucleic acids are DETECTED. The SARS-CoV-2 RNA is generally detectable in upper and lower  respiratory specimens dur ing the acute phase of infection.  Positive  results are indicative of active infection with SARS-CoV-2.  Clinical  correlation with patient history and other diagnostic information is  necessary to determine patient infection status.  Positive results do  not rule out bacterial infection or co-infection with other viruses. If result is PRESUMPTIVE POSTIVE SARS-CoV-2 nucleic acids MAY BE PRESENT.   A presumptive positive result was obtained on the submitted specimen  and confirmed on repeat testing.  While 2019 novel coronavirus  (SARS-CoV-2) nucleic acids may be present in the submitted sample  additional confirmatory testing may be necessary for epidemiological  and / or clinical management purposes  to differentiate between  SARS-CoV-2 and other Sarbecovirus currently known to infect humans.  If clinically indicated additional testing with an alternate test  methodology [id number]) is advised. The SARS-CoV-2 RNA is generally  detectable in upper and lower respiratory sp ecimens during the acute  phase of infection. The expected result is Negative. Fact Sheet for Patients:  StrictlyIdeas.no Fact Sheet for Healthcare Providers: BankingDealers.co.za This test is not yet approved or cleared by the [place] FDA and has been authorized for detection and/or diagnosis of SARS-CoV-2 by FDA under an Emergency Use Authorization (EUA).  This EUA will remain in effect (meaning this test can be  used) for the duration of the COVID-19 declaration under Section  564(b)(1) of the Act, 21 U.S.C. section 360bbb-3(b)(1), unless the authorization is terminated or revoked sooner. Performed at [facility], [place] [address], [place], [place] [address]      Radiological Exams on Admission: Dg Chest 2 View  Result Date: 09/22/2018 CLINICAL DATA:  Shortness of breath EXAM: CHEST - 2 VIEW COMPARISON:  Chest radiograph August 27, 2018 and chest CT August 28, 2018 FINDINGS: There remains generalized interstitial prominence, largely due to pulmonary fibrosis. No airspace consolidation. Heart size and pulmonary vascularity are normal. No adenopathy. No bone lesions. IMPRESSION: Fairly diffuse interstitial prominence which is felt to be due primarily to interstitial fibrosis. There is not felt to be cardiogenic edema. A mild degree of noncardiogenic edema superimposed is possible. Note that viral type pneumonitis may present in this manner. There is no airspace consolidation. Heart size normal. No adenopathy. Electronically Signed   By: Lowella Grip III M.D.   On: 09/22/2018 14:31   Ct Angio Chest Pe W/cm &/or Wo Cm  Result Date: 09/22/2018 CLINICAL DATA:  Interstitial lung disease. Dyspnea. Elevated D-dimer. EXAM: CT ANGIOGRAPHY CHEST WITH CONTRAST TECHNIQUE: Multidetector CT imaging of the chest was performed using the standard protocol during bolus administration of intravenous contrast. Multiplanar CT image reconstructions and MIPs were obtained to evaluate the vascular anatomy. Examination repeated once due to poor contrast opacification on the initial attempt. CONTRAST:  23m OMNIPAQUE IOHEXOL 300 MG/ML  SOLN COMPARISON:  Chest radiograph from earlier today. 08/28/2018 chest CT. FINDINGS: Cardiovascular: The study is low to moderate quality for the evaluation of pulmonary embolism, with motion degradation and suboptimal contrast opacification. There are no convincing filling defects in the  central, lobar, segmental or subsegmental pulmonary artery branches to suggest acute pulmonary embolism. Atherosclerotic nonaneurysmal thoracic aorta. Normal caliber pulmonary arteries. Top-normal heart size. No significant pericardial fluid/thickening. Mediastinum/Nodes: Stable hypodense 3.3 cm thyroid isthmus nodule with minimal peripheral calcification. Unremarkable esophagus. No pathologically enlarged axillary, mediastinal or hilar lymph nodes. Lungs/Pleura: No pneumothorax. No pleural effusion. No acute consolidative airspace disease, lung masses or significant pulmonary nodules. Mild patchy confluent subpleural reticulation and ground-glass attenuation throughout both lungs with associated mild architectural distortion, not substantially changed from 04/05/2018 high-resolution chest CT study. Upper abdomen: New small to moderate volume upper abdominal ascites. Irregular liver surface compatible with cirrhosis. Partially visualized splenomegaly. Musculoskeletal: No aggressive appearing focal osseous lesions. Mild thoracic spondylosis. Review of the MIP images confirms the above findings. IMPRESSION: 1. Limited scan.  No convincing evidence of pulmonary embolism. 2. Cirrhosis.  New small to moderate volume upper abdominal ascites. 3. Spectrum of findings compatible with chronic interstitial lung disease, not substantially changed since 04/05/2018 high-resolution chest CT study. No acute superimposed pulmonary disease. Aortic Atherosclerosis (ICD10-I70.0). Electronically Signed   By: JIlona SorrelM.D.   On: 09/22/2018 19:04    EKG: Independently reviewed.  Normal sinus rhythm.  Assessment/Plan Principal Problem:   Acute respiratory failure with hypoxia (HCC) Active Problems:   Hypothyroidism   Bicytopenia   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 [place]   Controlled type 2 diabetes mellitus without complication, without long-term current use of insulin (HCC)   ILD (interstitial lung  disease) ([place]    1. Acute respiratory failure likely multifactorial primary concerning for increasing fluid retention likely from patient's known history of cirrhosis.  Patient's shortness of breath could also be from interstitial lung disease decompensation.  For now patient was given Lasix 40 mg and I have placed patient on 60 mg p.o. twice daily.  Will avoid IV route for known history of cirrhosis.  On prednisone which will be continued.  Check 2D echo for any signs of right heart failure and pulmonary hypertension.  Will follow CBC. 2. Anemia with worsening hemoglobin will check serial CBCs.  If there is any further worsening of hemoglobin will need transfusion and I will place patient on Protonix and will need GI consult to check for varices. 3. Ascites with a history of liver cirrhosis and primary biliary cholangitis for which patient used to be on ursodiol but was not able to afford as patient lost her Medicaid.  We will keep patient on empiric antibiotics for now to cover for SBP will get ultrasound-guided paracentesis. 4. Diabetes mellitus type 2 on long-acting insulin.  If patient were to be kept n.p.o. will need to reduce the dose.  Sliding scale coverage. 5. Pancytopenia likely from cirrhosis. 6. Abdominal pain could be from ascites and possible SBP.  Will order CT scan and paracentesis. 7. Hypothyroidism on Synthroid.   DVT prophylaxis: SCDs. Code Status: Full code. Family Communication: Discussed with patient. Disposition Plan: Home. Consults called: None. Admission status: Inpatient.   Rise Patience MD [facility] Pager [phone].  If 7PM-7AM, please contact night-coverage [email or web address] Password [facility]  09/22/2018, 11:35 PM

## 2018-09-22 NOTE — Telephone Encounter (Signed)
Received records and put in Dr Celesta Aver folder for review.

## 2018-09-22 NOTE — Patient Instructions (Addendum)
Im sorry you are not feeling well. We have done a CXR and labs today.( CXR, BNP,INR,CBC,BMET,D Dimer,LFT's) We feel you need to go to the  ED at [facility] for further work up and treatment. Follow up in the pulmonary office once you have been discharged. We hope you feel better soon.

## 2018-09-22 NOTE — ED Notes (Signed)
Pt. Ambulated through halls, complaints of extreme SOB. Audible wheezing. Sats were 93-94% during walk, returning to 96% once in bed and resting.

## 2018-09-22 NOTE — ED Notes (Signed)
This Rn acting as Art therapist and asked pt if I could reach out to any family/friends. Pt requested that her brother, Sonia Side be called. I tried both numbers provided for her brother, but was unable to reach him. Will continue to try to reach him.

## 2018-09-22 NOTE — ED Provider Notes (Signed)
[facility] EM[facility]GENCY DE[facility]MENT Provider Note   CSN: [id number] Arrival date & time: 09/22/18  1521    History   Chief Complaint Chief Complaint  Patient presents with   Shortness of Breath    HPI Holly Mueller is a 52 y.o. female with past medical history of cirrhosis with ascites, GERD, diabetes, interstitial lung disease, [facility].  Of note, patient was recently admitted on 08/27/2018 and discharged on 09/02/2018 for similar complaint.  Patient has had worsening shortness of breath for over a week that is worse with exertion.  She feels as though she cannot catch her breath.  She is also noted to have greater than 30 pound weight gain since discharge until today.  She notices swelling in her abdomen and legs.  She has been using her inhalers without significant relief.  She denies new cough, fever, abdominal pain.  She has no known history of heart failure with an echocardiogram done in February 2019 with EF of 60 to 65% and grade 1 diastolic dysfunction.  She had labs and chest x-ray done today at the pulmonologist and was recommended to report to the ED for further management with concern for the weight gain and persistent shortness of breath.  Labs from clinic today reveal new elevated d-dimer at 4.43, up from 0.89 three weeks ago.  BNP is within normal limits.  Albumin is 3.0, slightly improved from 2.73 weeks ago.  CBC with pancytopenia.     The history is provided by the patient and medical records.    Past Medical History:  Diagnosis Date   Arthritis    bursitis left hip flares-not an issue   Diabetes mellitus without complication ([place])    Edentulous    10-19-13 at present   Epilepsy ([place]) in 1972   No seizures since 1972. Previously treated with phenobarbital.    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 ([place])    Hypothyroidism 2008   Lower esophageal ring 08/18/2013   Seasonal allergies 2003   Shortness of breath     Patient Active Problem List   Diagnosis Date Noted   Anemia 09/22/2018   Acute respiratory failure with hypoxia ([place]) 09/22/2018   Palpitations 09/13/2018   Hemoptysis 08/28/2018   Pancytopenia ([place]) 08/28/2018   Uninsured 04/04/2018   ILD (interstitial lung disease) ([place]) 03/23/2018   Food insecurity 10/15/2017   Hyperglycemia 10/06/2017   Ectatic aorta ([place]) 09/11/2017   Left leg pain    Controlled type 2 diabetes mellitus without complication, without long-term current use of insulin (HCC)    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 ([place]) 11/17/2016   Bicytopenia 10/24/2016   GERD (gastroesophageal reflux disease) 10/23/2016   Cough 12/09/2015   Chronic pain of left thumb 10/09/2015   Precordial chest pain 08/03/2015   Bursitis of left hip 09/14/2013   DOE (dyspnea on exertion) 08/17/2013   Ganglion cyst of wrist 08/24/2012   Right hip pain 69/67/8938   Umbilical hernia 02/24/5101   Healthcare maintenance 02/14/2012   Osteoarthritis 02/12/2012   Allergic rhinitis 08/26/2011   Obesity 07/08/2010   SEIZURES, HX OF 12/12/2009   DENTAL CARIES 12/18/2008   DEPRESSION 07/26/2008   MIGRAINE HEADACHE 11/15/2007   Hypothyroidism 10/12/2006   Restrictive lung disease 07/30/2006    Past Surgical History:  Procedure Laterality Date   BALLOON DILATION  N/A 10/24/2013   Procedure: BALLOON DILATION;  Surgeon: Gatha Mayer, MD;  Location: [facility];  Service: Endoscopy;  Laterality: N/A;   CESAREAN SECTION     x2   DENTAL SURGERY     multiple extractions 3'15   ESOPHAGOGASTRODUODENOSCOPY N/A 10/24/2013   Procedure: ESOPHAGOGASTRODUODENOSCOPY (EGD);  Surgeon: Gatha Mayer, MD;  Location: [facility];  Service: Endoscopy;  Laterality: N/A;   LIVER BIOPSY  06/30/2012    Procedure: LIVER BIOPSY;  Surgeon: Shann Medal, MD;  Location: [facility];  Service: General;;   NOVASURE ABLATION     SHOULDER ARTHROSCOPY Right 1610   UMBILICAL HERNIA REPAIR N/A 06/30/2012   Procedure: remove umbilicus;  Surgeon: Shann Medal, MD;  Location: [facility];  Service: General;  Laterality: N/A;   VENTRAL HERNIA REPAIR N/A 06/30/2012   Procedure: LAPAROSCOPIC VENTRAL HERNIA;  Surgeon: Shann Medal, MD;  Location: [facility];  Service: General;  Laterality: N/A;  With Mesh   VIDEO BRONCHOSCOPY Bilateral 07/20/2018   Procedure: VIDEO BRONCHOSCOPY WITHOUT FLUORO;  Surgeon: Brand Males, MD;  Location: [facility];  Service: Cardiopulmonary;  Laterality: Bilateral;   WISDOM TOOTH EXTRACTION       OB History   No obstetric history on file.      Home Medications    Prior to Admission medications   Medication Sig Start Date End Date Taking? Authorizing Provider  acetaminophen (TYLENOL) 500 MG tablet Take 1,000 mg by mouth every 8 (eight) hours as needed (pain).   Yes [provider]  albuterol (PROAIR HFA) 108 (90 Base) MCG/ACT inhaler INHALE 2 PUFFS INTO THE LUNGS EVERY 6 HOURS AS NEEDED FOR SHORTNESS OF BREATH Patient taking differently: Inhale 2 puffs into the lungs every 4 (four) hours as needed for wheezing or shortness of breath.  04/25/18  Yes Rory Percy, DO  albuterol (PROVENTIL) (2.5 MG/3ML) 0.083% nebulizer solution Take 3 mLs (2.5 mg total) by nebulization every 6 (six) hours as needed for wheezing or shortness of breath. 04/25/18  Yes Rory Percy, DO  cetirizine (ZYRTEC) 10 MG tablet Take 1 tablet (10 mg total) by mouth daily. 01/27/17  Yes [name], MD  fluticasone [facility]) 50 MCG/ACT nasal spray Place 2 sprays into both nostrils daily.   Yes [provider]  ibuprofen (ADVIL,MOTRIN) 200 MG tablet Take 600-800 mg by mouth every 6 (six) hours as needed for moderate pain.    Yes [provider]  insulin aspart (NOVOLOG)  100 UNIT/ML injection Use 4 times daily with meals and before bed to administer using sliding scale provided Patient taking differently: Inject 9-25 Units into the skin See admin instructions. Inject 9-25 units into the skin three times a day with meals, per sliding scale 09/02/18 09/02/19 Yes Dessa Phi, DO  insulin glargine (LANTUS) 100 UNIT/ML injection Inject 0.25 mLs (25 Units total) into the skin at bedtime. 09/02/18  Yes Dessa Phi, DO  levothyroxine (SYNTHROID, LEVOTHROID) 125 MCG tablet Take 2 tablets (250 mcg total) by mouth daily. 07/26/18  Yes [name], MD  metFORMIN (GLUCOPHAGE) 500 MG tablet Take 1 tablet (500 mg total) by mouth 2 (two) times daily with a meal. Patient taking differently: Take 1,000 mg by mouth 2 (two) times daily with a meal.  07/26/18  Yes [name], MD  Mometasone Furoate [facility] HFA) 200 MCG/ACT AERO Inhale 2 puffs into the lungs 2 (two) times daily. 05/25/18  Yes Brand Males, MD  pantoprazole (PROTONIX) 40 MG tablet Take 1 tablet (40 mg total) by mouth  2 (two) times daily before a meal. 07/26/18  Yes [name], MD  predniSONE (DELTASONE) 10 MG tablet Take 4 tabs for 7 days, then 3 tabs for 7 days, then 2 tabs for 7 days, then 1 tab for 7 days, then 1/2 tab for 7 days. Patient taking differently: Take 5-40 mg by mouth See admin instructions. Take 40 mg by mouth once a day for 7 days, 30 mg once a day for 7 days, 20 mg once a day for 7 days, 10 mg once a day for 7 days, then 5 mg once a day for 7 days as directed 09/02/18  Yes Dessa Phi, DO  blood glucose meter kit and supplies KIT Dispense based on patient and insurance preference. Use up to four times daily as directed. (FOR ICD-9 250.00, 250.01). 06/19/17   Rory Percy, DO  blood glucose meter kit and supplies Dispense based on patient and insurance preference. Use up to four times daily as directed. (FOR ICD-10 E10.9, E11.9). 09/02/18   Dessa Phi, DO  glucose blood  (TRUE METRIX BLOOD GLUCOSE TEST) test strip Use as instructed 09/16/18   [name], MD  Insulin Syringe-Needle U-100 (INSULIN SYRINGE .3CC/31GX5/16") 31G X 5/16" 0.3 ML MISC Use with lantus and novolog insulin 09/02/18   Dessa Phi, DO  TRUEplus Lancets 28G MISC Use to check blood sugar as directed. 09/16/18   [name], MD    Family History Family History  Problem Relation Age of Onset   Hypertension Mother    Diabetes Mother    Cirrhosis Mother        ? medications   Lung cancer Father    Stroke Father    Diabetes Sister    Hypertension Sister    Colon cancer Neg Hx    Esophageal cancer Neg Hx    Rectal cancer Neg Hx     Social History Social History   Tobacco Use   Smoking status: Former Smoker    Packs/day: 0.25    Types: Cigarettes    Last attempt to quit: 1997    Years since quitting: 23.3   Smokeless tobacco: Never Used   Tobacco comment: social  Substance Use Topics   Alcohol use: Not Currently    Comment: occ   Drug use: No     Allergies   Aspirin   Review of Systems Review of Systems  Constitutional: Negative for fever.  Respiratory: Positive for shortness of breath.   Cardiovascular: Positive for leg swelling. Negative for chest pain and palpitations.  Gastrointestinal: Negative for abdominal pain.  All other systems reviewed and are negative.    Physical Exam Updated Vital Signs BP 121/82 (BP Location: Right Arm)    Pulse 99    Temp 98.4 F (36.9 C) (Oral)    Resp 17    Wt 126.7 kg    LMP  (LMP Unknown)    SpO2 98%    BMI 45.08 kg/m   Physical Exam Vitals signs and nursing note reviewed.  Constitutional:      Appearance: She is well-developed. She is obese.  HENT:     Head: Normocephalic and atraumatic.  Eyes:     Conjunctiva/sclera: Conjunctivae normal.  Neck:     Musculoskeletal: Normal range of motion.  Cardiovascular:     Rate and Rhythm: Normal rate and regular rhythm.     Comments: 2-3+  bilateral pretibial pitting edema.  No erythema or warmth. Pulmonary:     Effort: Pulmonary effort is normal.  Comments: Crackles in bilateral bases.  Patient seems winded while speaking.  With some intermittent audible wheezing. Abdominal:     General: Bowel sounds are normal.     Palpations: Abdomen is soft.     Tenderness: There is no abdominal tenderness. There is no guarding.  Skin:    General: Skin is warm.  Neurological:     Mental Status: She is alert.  Psychiatric:        Behavior: Behavior normal.      ED Treatments / Results  Labs (all labs ordered are listed, but only abnormal results are displayed) Labs Reviewed  BASIC METABOLIC PANEL - Abnormal; Notable for the following components:      Result Value   CO2 19 (*)    Glucose, Bld 243 (*)    Calcium 8.6 (*)    All other components within normal limits  CBC - Abnormal; Notable for the following components:   WBC 2.0 (*)    RBC 2.91 (*)    Hemoglobin 7.7 (*)    HCT 25.5 (*)    Platelets 79 (*)    nRBC 1.0 (*)    All other components within normal limits  GLUCOSE, CAPILLARY - Abnormal; Notable for the following components:   Glucose-Capillary 116 (*)    All other components within normal limits  SARS CORONAVIRUS 2 (HOSPITAL ORDER, [place] [facility])    EKG EKG Interpretation  Date/Time:  Thursday Sep 22 2018 15:28:29 EDT Ventricular Rate:  94 PR Interval:  130 QRS Duration: 84 QT Interval:  352 QTC Calculation: 440 R Axis:   -6 Text Interpretation:  Normal sinus rhythm Moderate voltage criteria for LVH, may be normal variant Borderline ECG Confirmed by Julianne Rice [phone]) on 09/22/2018 9:14:20 PM   Radiology Dg Chest 2 View  Result Date: 09/22/2018 CLINICAL DATA:  Shortness of breath EXAM: CHEST - 2 VIEW COMPARISON:  Chest radiograph August 27, 2018 and chest CT August 28, 2018 FINDINGS: There remains generalized interstitial prominence, largely due to pulmonary fibrosis. No  airspace consolidation. Heart size and pulmonary vascularity are normal. No adenopathy. No bone lesions. IMPRESSION: Fairly diffuse interstitial prominence which is felt to be due primarily to interstitial fibrosis. There is not felt to be cardiogenic edema. A mild degree of noncardiogenic edema superimposed is possible. Note that viral type pneumonitis may present in this manner. There is no airspace consolidation. Heart size normal. No adenopathy. Electronically Signed   By: Lowella Grip III M.D.   On: 09/22/2018 14:31   Ct Angio Chest Pe W/cm &/or Wo Cm  Result Date: 09/22/2018 CLINICAL DATA:  Interstitial lung disease. Dyspnea. Elevated D-dimer. EXAM: CT ANGIOGRAPHY CHEST WITH CONTRAST TECHNIQUE: Multidetector CT imaging of the chest was performed using the standard protocol during bolus administration of intravenous contrast. Multiplanar CT image reconstructions and MIPs were obtained to evaluate the vascular anatomy. Examination repeated once due to poor contrast opacification on the initial attempt. CONTRAST:  242m OMNIPAQUE IOHEXOL 300 MG/ML  SOLN COMPARISON:  Chest radiograph from earlier today. 08/28/2018 chest CT. FINDINGS: Cardiovascular: The study is low to moderate quality for the evaluation of pulmonary embolism, with motion degradation and suboptimal contrast opacification. There are no convincing filling defects in the central, lobar, segmental or subsegmental pulmonary artery branches to suggest acute pulmonary embolism. Atherosclerotic nonaneurysmal thoracic aorta. Normal caliber pulmonary arteries. Top-normal heart size. No significant pericardial fluid/thickening. Mediastinum/Nodes: Stable hypodense 3.3 cm thyroid isthmus nodule with minimal peripheral calcification. Unremarkable esophagus. No pathologically enlarged axillary,  mediastinal or hilar lymph nodes. Lungs/Pleura: No pneumothorax. No pleural effusion. No acute consolidative airspace disease, lung masses or significant  pulmonary nodules. Mild patchy confluent subpleural reticulation and ground-glass attenuation throughout both lungs with associated mild architectural distortion, not substantially changed from 04/05/2018 high-resolution chest CT study. Upper abdomen: New small to moderate volume upper abdominal ascites. Irregular liver surface compatible with cirrhosis. Partially visualized splenomegaly. Musculoskeletal: No aggressive appearing focal osseous lesions. Mild thoracic spondylosis. Review of the MIP images confirms the above findings. IMPRESSION: 1. Limited scan.  No convincing evidence of pulmonary embolism. 2. Cirrhosis.  New small to moderate volume upper abdominal ascites. 3. Spectrum of findings compatible with chronic interstitial lung disease, not substantially changed since 04/05/2018 high-resolution chest CT study. No acute superimposed pulmonary disease. Aortic Atherosclerosis (ICD10-I70.0). Electronically Signed   By: Ilona Sorrel M.D.   On: 09/22/2018 19:04    Procedures Procedures (including critical care time)  Medications Ordered in ED Medications  iohexol (OMNIPAQUE) 300 MG/ML solution 100 mL (100 mLs Intravenous Contrast Given 09/22/18 1804)  furosemide (LASIX) injection 40 mg (40 mg Intravenous Given 09/22/18 2014)     Initial Impression / Assessment and Plan / ED Course  I have reviewed the triage vital signs and the nursing notes.  Pertinent labs & imaging results that were available during my care of the patient were reviewed by me and considered in my medical decision making (see chart for details).        Patient with history of chronic interstitial lung disease, liver cirrhosis with ascites, presenting to the emergency department from pulmonology office with worsening shortness of breath since last admission.  She is significant dyspnea on exertion and is winded with talking.  She has greater than 30 pound weight gain since recent discharge at the end of April.  Labs were done  at pulmonology office prior to arrival with elevated d-dimer from previous.  CBC with persistent pancytopenia.  BNP is within normal limits.  Chest x-ray without infiltrate.  CTA obtained given new elevation in d-dimer.  Shows persistent interstitial lung disease and new small to moderate volume of upper abdominal ascites.  Patient ambulated in the ED and maintained O2 saturation, however had significant dyspnea with audible wheezing.  The setting of significant dyspnea on exertion with worsening ascites,  patient will be admitted for further management.  Dr. Hal Hope accepting admission.  Patient discussed with Dr. Lita Mains, who guided treatment agrees with care plan for admission.  Discussed results, findings, treatment and follow up. Patient advised of return precautions. Patient verbalized understanding and agreed with plan.  Final Clinical Impressions(s) / ED Diagnoses   Final diagnoses:  Dyspnea on exertion  Interstitial lung disease [facility])  Other ascites    ED Discharge Orders    None       Brycin Kille, [place] N, PA-C 09/22/18 2310    Julianne Rice, MD 09/24/18 [phone]

## 2018-09-22 NOTE — ED Triage Notes (Signed)
Pt in w/sob, worsened x few days. States she went to pulmonologist this am and had a cxr, showing fluid on the lungs. Sent to ED for incr abdominal and leg swelling, states she has gained 30lbs in 9 days. Has hx of lung disease, endorses chronic cough. Denies any fever, has some intermittent sharp cp

## 2018-09-22 NOTE — Assessment & Plan Note (Signed)
Enlarging abdominal girth, with weight gain of 30+ pounds over the last 9 days Hx. Cirrhosis Plan Consider pelvic Abdominal CT  INR CBC LFT's Refer to ED for immediate care

## 2018-09-22 NOTE — Assessment & Plan Note (Signed)
Pale Profound Fatigue Recent hospitalization with thrombocytopenia Cirrhosis Plan CBC Transfuse for HGB < 7 Determine source of bleed/ etiology Refer to [facility] ED

## 2018-09-23 ENCOUNTER — Inpatient Hospital Stay (HOSPITAL_COMMUNITY): Payer: Medicaid Other

## 2018-09-23 ENCOUNTER — Other Ambulatory Visit (HOSPITAL_COMMUNITY): Payer: Medicaid Other

## 2018-09-23 ENCOUNTER — Encounter (HOSPITAL_COMMUNITY): Payer: Self-pay | Admitting: Radiology

## 2018-09-23 DIAGNOSIS — K7290 Hepatic failure, unspecified without coma: Secondary | ICD-10-CM

## 2018-09-23 DIAGNOSIS — R188 Other ascites: Secondary | ICD-10-CM

## 2018-09-23 DIAGNOSIS — E039 Hypothyroidism, unspecified: Secondary | ICD-10-CM

## 2018-09-23 DIAGNOSIS — J849 Interstitial pulmonary disease, unspecified: Secondary | ICD-10-CM

## 2018-09-23 HISTORY — PX: IR PARACENTESIS: IMG2679

## 2018-09-23 LAB — PROTEIN, PLEURAL OR PERITONEAL FLUID: Total protein, fluid: <3 g/dL

## 2018-09-23 LAB — CBC WITH DIFFERENTIAL/PLATELET
Abs Immature Granulocytes: 0.02 10*3/uL (ref 0.00–0.07)
Basophils Absolute: 0 10*3/uL (ref 0.0–0.1)
Basophils Relative: 1 %
Eosinophils Absolute: 0.1 10*3/uL (ref 0.0–0.5)
Eosinophils Relative: 5 %
HCT: 21.3 % — ABNORMAL LOW (ref 36.0–46.0)
Hemoglobin: 6.5 g/dL — CL (ref 12.0–15.0)
Immature Granulocytes: 1 %
Lymphocytes Relative: 33 %
Lymphs Abs: 0.7 10*3/uL (ref 0.7–4.0)
MCH: 25.6 pg — ABNORMAL LOW (ref 26.0–34.0)
MCHC: 30.5 g/dL (ref 30.0–36.0)
MCV: 83.9 fL (ref 80.0–100.0)
Monocytes Absolute: 0.3 10*3/uL (ref 0.1–1.0)
Monocytes Relative: 13 %
Neutro Abs: 1 10*3/uL — ABNORMAL LOW (ref 1.7–7.7)
Neutrophils Relative %: 47 %
Platelets: 68 10*3/uL — ABNORMAL LOW (ref 150–400)
RBC: 2.54 MIL/uL — ABNORMAL LOW (ref 3.87–5.11)
RDW: 15.3 % (ref 11.5–15.5)
WBC: 2.1 10*3/uL — ABNORMAL LOW (ref 4.0–10.5)
nRBC: 0 % (ref 0.0–0.2)

## 2018-09-23 LAB — BODY FLUID CELL COUNT WITH DIFFERENTIAL
Lymphs, Fluid: 61 %
Monocyte-Macrophage-Serous Fluid: 13 % — ABNORMAL LOW (ref 50–90)
Neutrophil Count, Fluid: 26 % — ABNORMAL HIGH (ref 0–25)
Total Nucleated Cell Count, Fluid: 215 cu mm (ref 0–1000)

## 2018-09-23 LAB — CBC
HCT: 23.3 % — ABNORMAL LOW (ref 36.0–46.0)
HCT: 25.4 % — ABNORMAL LOW (ref 36.0–46.0)
Hemoglobin: 7.1 g/dL — ABNORMAL LOW (ref 12.0–15.0)
Hemoglobin: 7.8 g/dL — ABNORMAL LOW (ref 12.0–15.0)
MCH: 25.5 pg — ABNORMAL LOW (ref 26.0–34.0)
MCH: 25.6 pg — ABNORMAL LOW (ref 26.0–34.0)
MCHC: 30.5 g/dL (ref 30.0–36.0)
MCHC: 30.7 g/dL (ref 30.0–36.0)
MCV: 83.3 fL (ref 80.0–100.0)
MCV: 83.8 fL (ref 80.0–100.0)
Platelets: 74 10*3/uL — ABNORMAL LOW (ref 150–400)
Platelets: 75 10*3/uL — ABNORMAL LOW (ref 150–400)
RBC: 2.78 MIL/uL — ABNORMAL LOW (ref 3.87–5.11)
RBC: 3.05 MIL/uL — ABNORMAL LOW (ref 3.87–5.11)
RDW: 15 % (ref 11.5–15.5)
RDW: 15.3 % (ref 11.5–15.5)
WBC: 1.8 10*3/uL — ABNORMAL LOW (ref 4.0–10.5)
WBC: 2.1 10*3/uL — ABNORMAL LOW (ref 4.0–10.5)
nRBC: 0 % (ref 0.0–0.2)
nRBC: 0 % (ref 0.0–0.2)

## 2018-09-23 LAB — HEPATIC FUNCTION PANEL
ALT: 37 U/L (ref 0–44)
AST: 32 U/L (ref 15–41)
Albumin: 2.5 g/dL — ABNORMAL LOW (ref 3.5–5.0)
Alkaline Phosphatase: 117 U/L (ref 38–126)
Bilirubin, Direct: 0.4 mg/dL — ABNORMAL HIGH (ref 0.0–0.2)
Indirect Bilirubin: 0.9 mg/dL (ref 0.3–0.9)
Total Bilirubin: 1.3 mg/dL — ABNORMAL HIGH (ref 0.3–1.2)
Total Protein: 5.8 g/dL — ABNORMAL LOW (ref 6.5–8.1)

## 2018-09-23 LAB — ALBUMIN, PLEURAL OR PERITONEAL FLUID: Albumin, Fluid: <1 g/dL

## 2018-09-23 LAB — PREPARE RBC (CROSSMATCH)

## 2018-09-23 LAB — GRAM STAIN

## 2018-09-23 LAB — GLUCOSE, CAPILLARY
Glucose-Capillary: 127 mg/dL — ABNORMAL HIGH (ref 70–99)
Glucose-Capillary: 169 mg/dL — ABNORMAL HIGH (ref 70–99)
Glucose-Capillary: 195 mg/dL — ABNORMAL HIGH (ref 70–99)
Glucose-Capillary: 82 mg/dL (ref 70–99)

## 2018-09-23 LAB — GLUCOSE, PLEURAL OR PERITONEAL FLUID: Glucose, Fluid: 97 mg/dL

## 2018-09-23 LAB — ABO/RH: ABO/RH(D): O POS

## 2018-09-23 LAB — TSH: TSH: 0.181 u[IU]/mL — ABNORMAL LOW (ref 0.350–4.500)

## 2018-09-23 LAB — TROPONIN I: Troponin I: <0.03 ng/mL (ref <0.03)

## 2018-09-23 MED ORDER — PANTOPRAZOLE SODIUM 40 MG IV SOLR: 40.0000 mg | Freq: Two times a day (BID) | INTRAVENOUS | Status: DC

## 2018-09-23 MED ORDER — SPIRONOLACTONE 25 MG PO TABS
100.0000 mg | ORAL_TABLET | Freq: Every day | ORAL | Status: DC
  Administered 2018-09-23 – 2018-09-26 (×4): 100 mg via ORAL
  Filled 2018-09-23 (×4): qty 4

## 2018-09-23 MED ORDER — LIDOCAINE HCL 1 % IJ SOLN
INTRAMUSCULAR | Status: DC | PRN
  Administered 2018-09-23: 10 mL

## 2018-09-23 MED ORDER — INSULIN ASPART 100 UNIT/ML ~~LOC~~ SOLN: 0.0000 [IU] | SUBCUTANEOUS | Status: DC

## 2018-09-23 MED ORDER — SODIUM CHLORIDE 0.9 % IV SOLN
8.0000 mg/h | INTRAVENOUS | Status: AC
  Administered 2018-09-23 – 2018-09-26 (×3): 8 mg/h via INTRAVENOUS
  Filled 2018-09-23 (×6): qty 80

## 2018-09-23 MED ORDER — TRAMADOL HCL 50 MG PO TABS
25.0000 mg | ORAL_TABLET | Freq: Four times a day (QID) | ORAL | Status: DC | PRN
  Administered 2018-09-23: 25 mg via ORAL
  Filled 2018-09-23: qty 1

## 2018-09-23 MED ORDER — SODIUM CHLORIDE 0.9 % IV SOLN
80.0000 mg | Freq: Once | INTRAVENOUS | Status: AC
  Administered 2018-09-23: 80 mg via INTRAVENOUS
  Filled 2018-09-23: qty 80

## 2018-09-23 MED ORDER — LIDOCAINE HCL 1 % IJ SOLN
INTRAMUSCULAR | Status: AC
  Filled 2018-09-23: qty 20

## 2018-09-23 MED ORDER — INSULIN ASPART 100 UNIT/ML ~~LOC~~ SOLN
0.0000 [IU] | Freq: Four times a day (QID) | SUBCUTANEOUS | Status: DC
  Administered 2018-09-23: 1 [IU] via SUBCUTANEOUS
  Administered 2018-09-23: 2 [IU] via SUBCUTANEOUS

## 2018-09-23 MED ORDER — SODIUM CHLORIDE 0.9% IV SOLUTION
Freq: Once | INTRAVENOUS | Status: AC
  Administered 2018-09-23: 06:00:00 via INTRAVENOUS

## 2018-09-23 NOTE — Procedures (Signed)
PROCEDURE SUMMARY:  Successful US guided paracentesis from RLQ.  Yielded 1850 mL of hazy yellow fluid.  No immediate complications.  Pt tolerated well.   Specimen was sent for labs.  EBL < 39m  KAscencion DikePA-C 09/23/2018 10:29 AM

## 2018-09-23 NOTE — TOC Initial Note (Signed)
Transition of Care [facility]) - Initial/Assessment Note    Patient Details  Name: Holly Mueller MRN: [id number] Date of Birth: 10/27/1965  Transition of Care [facility]) CM/SW Contact:    Bethena Roys, RN Phone Number: 09/23/2018, 2:17 PM  Clinical Narrative:  Pt presented for SOB, increased wt gain and fluid overload. PTA from home with her sons and a roommate. Patient is currently without insurance and not working. Pt has DME RW in the home. Patient has PCP Lindell Noe at the [facility]- appointment scheduled and placed on AVS. Pt states she uses the orange card for medication assistance and gets medications from the [facility] pharmacy- all Rx's are being mailed to patients at this time. Pt states she can get transportation to appointments and pharmacy. GI is following-unsure of disposition at this time. CM will continue to follow for any HH needs vs. DME needs.                                Expected Discharge Plan: Home/Self Care Barriers to Discharge: Continued Medical Work up   Patient Goals and CMS Choice Patient states their goals for this hospitalization and ongoing recovery are:: "to feel better"  CMS Medicare.gov Compare Post Acute Care list provided to:: (N/A) Choice offered to / list presented to : NA  Expected Discharge Plan and Services Expected Discharge Plan: Home/Self Care In-house Referral: NA Discharge Planning Services: CM Consult Post Acute Care Choice: NA Living arrangements for the past 2 months: Single Family Home                 DME Arranged: N/A DME Agency: NA       HH Arranged: NA HH Agency: NA        Prior Living Arrangements/Services Living arrangements for the past 2 months: Single Family Home Lives with:: Adult Children, Roommate Patient language and need for interpreter reviewed:: Yes Do you feel safe going back to the place where you live?: Yes      Need for Family Participation in Patient Care: No (Comment) Care giver support  system in place?: No (comment) Current home services: DME(Pt has RW in the home. ) Criminal Activity/Legal Involvement Pertinent to Current Situation/Hospitalization: No - Comment as needed  Activities of Daily Living Home Assistive Devices/Equipment: None ADL Screening (condition at time of admission) Patient's cognitive ability adequate to safely complete daily activities?: Yes Is the patient deaf or have difficulty hearing?: No Does the patient have difficulty seeing, even when wearing glasses/contacts?: No Does the patient have difficulty concentrating, remembering, or making decisions?: No Patient able to express need for assistance with ADLs?: No Does the patient have difficulty dressing or bathing?: No Independently performs ADLs?: Yes (appropriate for developmental age) Does the patient have difficulty walking or climbing stairs?: No Weakness of Legs: None Weakness of Arms/Hands: None  Permission Sought/Granted                  Emotional Assessment Appearance:: Appears stated age Attitude/Demeanor/Rapport: Engaged Affect (typically observed): Accepting, Appropriate Orientation: : Oriented to Self, Oriented to Situation, Oriented to Place, Oriented to  Time Alcohol / Substance Use: Not Applicable Psych Involvement: No (comment)  Admission diagnosis:  sob Patient Active Problem List   Diagnosis Date Noted  . Anemia 09/22/2018  . Acute respiratory failure with hypoxia ([place]) 09/22/2018  . Palpitations 09/13/2018  . Hemoptysis 08/28/2018  . Pancytopenia ([place]) 08/28/2018  . Uninsured 04/04/2018  .  ILD (interstitial lung disease) ([place]) 03/23/2018  . Food insecurity 10/15/2017  . Hyperglycemia 10/06/2017  . Ectatic aorta ([place]) 09/11/2017  . Left leg pain   . Controlled type 2 diabetes mellitus without complication, without long-term current use of insulin ([place])   . 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 ([place]) 11/17/2016  . Bicytopenia 10/24/2016  .  GERD (gastroesophageal reflux disease) 10/23/2016  . Cough 12/09/2015  . Chronic pain of left thumb 10/09/2015  . Precordial chest pain 08/03/2015  . Bursitis of left hip 09/14/2013  . DOE (dyspnea on exertion) 08/17/2013  . Ganglion cyst of wrist 08/24/2012  . Right hip pain 08/23/2012  . Umbilical hernia 46/00/2984  . Healthcare maintenance 02/14/2012  . Osteoarthritis 02/12/2012  . Allergic rhinitis 08/26/2011  . Obesity 07/08/2010  . SEIZURES, HX OF 12/12/2009  . DENTAL CARIES 12/18/2008  . DEPRESSION 07/26/2008  . MIGRAINE HEADACHE 11/15/2007  . Hypothyroidism 10/12/2006  . Restrictive lung disease 07/30/2006   PCP:  [name], MD Pharmacy:   [place], [place] - [address] [facility]. [address] [place] [address] Phone: [phone] Fax: [place], [place] [address] [place] [address] Phone: [phone] Fax: [phone]     Social Determinants of Health (SDOH) Interventions    Readmission Risk Interventions No flowsheet data found.

## 2018-09-23 NOTE — Consult Note (Addendum)
[facility] Consult: 9:32 AM 09/23/2018  LOS: 1 day    Referring Provider: Dr Cruzita Lederer  Primary Care Physician:  [name], MD Primary Gastroenterologist:  Dr. Carlean Purl.  Dr Therisa Doyne.  [place] NP at [facility]        Reason for Consultation: Decompensated cirrhosis, anemia.   HPI: Holly Mueller is a 53 y.o. female.  Hx interstitial lung disease. IDDM.  Hypothyroidism.  Obesity.  Pancytopenia..   Primary biliary cholangitis with cirrhosis of liver.     10/2013 EGD.  For dysphagia.  Lower esophageal ring balloon dilated.  Suspected eosinophilic esophagitis with longitudinal furrows, white spots throughout the esophagus.  This was biopsied.  Pathology showed chronic gastritis, no H. pylori.  Esophagealbiopsy showed reactive squamous mucosa, rare neutrophil.  No increased intraepithelial eosinophils. No previous Colonoscopy.   06/2017 abdominal ultrasound: Cirrhotic liver.  Splenomegaly.  Ectatic abdominal aortic measuring up to 2.6 cm  Unable to afford ursodiol.  Lack of medical insurance.    Baseline severe dyspnea on exertion, not on home oxygen.  09/13/2018 telemedicine visit with Dr. Carlean Purl, referral after inpt 4/18 - 09/02/2022 with  hemoptysis, coag negative.  FOBT negative.  She discharged on a prednisone taper.  She had never followed up with him after the 2015 EGD and had been seen in the interim by Dr Therisa Doyne at [facility].   Talked about non-urgent EGD and possible colonoscopy once Covid restrictions lifted. ? Need for Hep B vaccination and CT liver imaging.    During the hospitalization in April the patient had had abdominal pain that was moderate and nonspecific.  After she returned home she had progressive swelling of her abdomen.  She was moving her bowels regularly.  For about 3 to 5 days right  after she returned home the stools were black, but eventually returned to brown.  No nausea.  No vomiting.  Good appetite. She was seen by Eric Form, pulmonary NP yesterday.  Her breathing was worse and both lung and abdominal exams were worrisome so she was sent to the ED.  Her weight had gone from 245# to 279#.  A few days ago she noticed some swelling without pain in her right lower extremity.  CT angio of chest and renal stone abdominal CT yesterday showed cirrhosis with new small to moderate upper abdominal ascites.  Cirrhosis.  Upper abdominal and perigastric varices. No PE. Paracentesis today.   She is anemic.  Hgb baseline  ~ 8 late April.  11 to 12 in mid March.  Hgb now drifted 8.5 >> 6.5   Underwent 1.8 L paracentesis this morning.  Fluid WBCs 215, neutrophil count 26    Past Medical History:  Diagnosis Date   Arthritis    bursitis left hip flares-not an issue   Diabetes mellitus without complication ([place])    Edentulous    10-19-13 at present   Epilepsy ([place]) in 1972   No seizures since 1972. Previously treated with phenobarbital.    GERD (gastroesophageal reflux disease) 2008   Headache(784.0)    hx of migraines  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 ([place])    Hypothyroidism 2008   Lower esophageal ring 08/18/2013   Seasonal allergies 2003   Shortness of breath     Past Surgical History:  Procedure Laterality Date   BALLOON DILATION N/A 10/24/2013   Procedure: BALLOON DILATION;  Surgeon: Gatha Mayer, MD;  Location: [facility];  Service: Endoscopy;  Laterality: N/A;   CESAREAN SECTION     x2   DENTAL SURGERY     multiple extractions 3'15   ESOPHAGOGASTRODUODENOSCOPY N/A 10/24/2013   Procedure: ESOPHAGOGASTRODUODENOSCOPY (EGD);  Surgeon: Gatha Mayer, MD;  Location: [facility];  Service: Endoscopy;  Laterality: N/A;   LIVER BIOPSY  06/30/2012   Procedure: LIVER BIOPSY;  Surgeon: Shann Medal, MD;  Location: [facility];  Service:  General;;   NOVASURE ABLATION     SHOULDER ARTHROSCOPY Right 2585   UMBILICAL HERNIA REPAIR N/A 06/30/2012   Procedure: remove umbilicus;  Surgeon: Shann Medal, MD;  Location: [facility];  Service: General;  Laterality: N/A;   VENTRAL HERNIA REPAIR N/A 06/30/2012   Procedure: LAPAROSCOPIC VENTRAL HERNIA;  Surgeon: Shann Medal, MD;  Location: [facility];  Service: General;  Laterality: N/A;  With Mesh   VIDEO BRONCHOSCOPY Bilateral 07/20/2018   Procedure: VIDEO BRONCHOSCOPY WITHOUT FLUORO;  Surgeon: Brand Males, MD;  Location: [facility];  Service: Cardiopulmonary;  Laterality: Bilateral;   WISDOM TOOTH EXTRACTION      Prior to Admission medications   Medication Sig Start Date End Date Taking? Authorizing Provider  acetaminophen (TYLENOL) 500 MG tablet Take 1,000 mg by mouth every 8 (eight) hours as needed (pain).   Yes [provider]  albuterol (PROAIR HFA) 108 (90 Base) MCG/ACT inhaler INHALE 2 PUFFS INTO THE LUNGS EVERY 6 HOURS AS NEEDED FOR SHORTNESS OF BREATH Patient taking differently: Inhale 2 puffs into the lungs every 4 (four) hours as needed for wheezing or shortness of breath.  04/25/18  Yes Rory Percy, DO  albuterol (PROVENTIL) (2.5 MG/3ML) 0.083% nebulizer solution Take 3 mLs (2.5 mg total) by nebulization every 6 (six) hours as needed for wheezing or shortness of breath. 04/25/18  Yes Rory Percy, DO  cetirizine (ZYRTEC) 10 MG tablet Take 1 tablet (10 mg total) by mouth daily. 01/27/17  Yes [name], MD  fluticasone [facility]) 50 MCG/ACT nasal spray Place 2 sprays into both nostrils daily.   Yes [provider]  ibuprofen (ADVIL,MOTRIN) 200 MG tablet Take 600-800 mg by mouth every 6 (six) hours as needed for moderate pain.    Yes [provider]  insulin aspart (NOVOLOG) 100 UNIT/ML injection Use 4 times daily with meals and before bed to administer using sliding scale provided Patient taking differently: Inject 9-25 Units into  the skin See admin instructions. Inject 9-25 units into the skin three times a day with meals, per sliding scale 09/02/18 09/02/19 Yes Dessa Phi, DO  insulin glargine (LANTUS) 100 UNIT/ML injection Inject 0.25 mLs (25 Units total) into the skin at bedtime. 09/02/18  Yes Dessa Phi, DO  levothyroxine (SYNTHROID, LEVOTHROID) 125 MCG tablet Take 2 tablets (250 mcg total) by mouth daily. 07/26/18  Yes [name], MD  metFORMIN (GLUCOPHAGE) 500 MG tablet Take 1 tablet (500 mg total) by mouth 2 (two) times daily with a meal. Patient taking differently: Take 1,000 mg by mouth 2 (two) times daily with a meal.  07/26/18  Yes [name], MD  Mometasone Furoate [facility] HFA) 200 MCG/ACT AERO Inhale 2 puffs into the lungs  2 (two) times daily. 05/25/18  Yes Brand Males, MD  pantoprazole (PROTONIX) 40 MG tablet Take 1 tablet (40 mg total) by mouth 2 (two) times daily before a meal. 07/26/18  Yes [name], MD  predniSONE (DELTASONE) 10 MG tablet Take 4 tabs for 7 days, then 3 tabs for 7 days, then 2 tabs for 7 days, then 1 tab for 7 days, then 1/2 tab for 7 days. Patient taking differently: Take 5-40 mg by mouth See admin instructions. Take 40 mg by mouth once a day for 7 days, 30 mg once a day for 7 days, 20 mg once a day for 7 days, 10 mg once a day for 7 days, then 5 mg once a day for 7 days as directed 09/02/18  Yes Dessa Phi, DO  blood glucose meter kit and supplies KIT Dispense based on patient and insurance preference. Use up to four times daily as directed. (FOR ICD-9 250.00, 250.01). 06/19/17   Rory Percy, DO  blood glucose meter kit and supplies Dispense based on patient and insurance preference. Use up to four times daily as directed. (FOR ICD-10 E10.9, E11.9). 09/02/18   Dessa Phi, DO  glucose blood (TRUE METRIX BLOOD GLUCOSE TEST) test strip Use as instructed 09/16/18   [name], MD  Insulin Syringe-Needle U-100 (INSULIN SYRINGE .3CC/31GX5/16") 31G X  5/16" 0.3 ML MISC Use with lantus and novolog insulin 09/02/18   Dessa Phi, DO  TRUEplus Lancets 28G MISC Use to check blood sugar as directed. 09/16/18   [name], MD    Scheduled Meds:  furosemide  60 mg Oral BID   insulin aspart  0-9 Units Subcutaneous Q6H   insulin glargine  25 Units Subcutaneous QHS   levothyroxine  250 mcg Oral Q0600   loratadine  10 mg Oral Daily   [START ON 09/26/2018] pantoprazole  40 mg Intravenous Q12H   predniSONE  10 mg Oral Q breakfast   Infusions:  cefTRIAXone (ROCEPHIN)  IV 1 g (09/23/18 0137)   pantoprozole (PROTONIX) infusion 8 mg/hr (09/23/18 0626)   PRN Meds: albuterol, ondansetron **OR** ondansetron (ZOFRAN) IV, traMADol   Allergies as of 09/22/2018 - Review Complete 09/22/2018  Allergen Reaction Noted   Aspirin Nausea Only 03/29/2012    Family History  Problem Relation Age of Onset   Hypertension Mother    Diabetes Mother    Cirrhosis Mother        ? medications   Lung cancer Father    Stroke Father    Diabetes Sister    Hypertension Sister    Colon cancer Neg Hx    Esophageal cancer Neg Hx    Rectal cancer Neg Hx     Social History   Socioeconomic History   Marital status: Single    Spouse name: Not on file   Number of children: 2   Years of education: Not on file   Highest education level: Not on file  Occupational History   Occupation: unemployed  Social Designer, fashion/clothing strain: Very hard   Food insecurity:    Worry: Sometimes true    Inability: Sometimes true   Transportation needs:    Medical: Yes    Non-medical: Yes  Tobacco Use   Smoking status: Former Smoker    Packs/day: 0.25    Types: Cigarettes    Last attempt to quit: 1997    Years since quitting: 23.3   Smokeless tobacco: Never Used   Tobacco comment: social  Substance and Sexual Activity  Alcohol use: Not Currently    Comment: occ   Drug use: No   Sexual activity: Not Currently    Lifestyle   Physical activity:    Days per week: 3 days    Minutes per session: 40 min   Stress: Rather much  Relationships   Social connections:    Talks on phone: More than three times a week    Gets together: More than three times a week    Attends religious service: Never    Active member of club or organization: No    Attends meetings of clubs or organizations: Never    Relationship status: Never married   Intimate partner violence:    Fear of current or ex partner: Patient refused    Emotionally abused: Patient refused    Physically abused: Patient refused    Forced sexual activity: Patient refused  Other Topics Concern   Not on file  Social History Narrative   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  Speech impediment. ENT:  No nose bleeds Pulm: No shortness of breath or cough. CV:  No palpitations, no LE edema.  GU:  No hematuria, no frequency GI: Per HPI. Heme: Denies unusual bleeding or excessive bruising. Transfusions: None. Neuro:  No headaches, no peripheral tingling or numbness Derm:  No itching, no rash or sores.  Endocrine:  No sweats or chills.  No polyuria or dysuria Immunization: Reviewed. Travel:  None beyond local counties in last few months.    PHYSICAL EXAM: Vital signs in last 24 hours: Vitals:   09/23/18 0905 09/23/18 0908  BP: (!) 97/53 (!) 97/56  Pulse: 88 86  Resp: 16 16  Temp: 98.5 F (36.9 C) 98.5 F (36.9 C)  SpO2:  94%   Wt Readings from Last 3 Encounters:  09/23/18 120.8 kg  09/22/18 126.7 kg  09/13/18 111.1 kg    General: Pleasant, comfortable, obese WF.  Slightly chronically ill but not acutely ill looking. Head: Facial asymmetry or swelling.  No signs of head trauma. Eyes: Scleral icterus, no conjunctival  pallor.  EOMI. Ears: Not hard of hearing.   Nose: No congestion or discharge. Mouth: Tongue midline.  Pharynx moist, pink, clear.  Edentulous. Neck: No JVD, no masses, no thyromegaly. Lungs: Crackles at least halfway up bilaterally.  No labored breathing, no cough. Heart: RRR.  No MRG.  S1, S2 present. Abdomen: Obese, soft.  Not tender or distended.  Active bowel sounds.  No HSM, bruits, masses, hernias..   Rectal: Deferred Musc/Skeltl: No joint redness, swelling or gross deformity. Extremities: No peripheral edema. Neurologic:.  Oriented x3.  Moves all 4 limbs.  No tremors or asterixis. Skin: No rash, no cirrhosis, no telangiectasia.  No significant bruising. Nodes: No cervical adenopathy. Psych: Pleasant, cooperative, calm.  Intake/Output from previous day: 05/14 0701 - 05/15 0700 In: 300 [I.V.:100; IV Piggyback:200] Out: 1000 [Urine:1000] Intake/Output this shift: Total I/O In: 315 [Blood:315] Out: -   LAB RESULTS: Recent Labs    09/22/18 1531 09/22/18 2349 09/23/18 0226  WBC 2.0* 2.1* 2.1*  HGB 7.7* 7.1* 6.5*  HCT 25.5* 23.3* 21.3*  PLT 79* 74* 68*   BMET Lab Results  Component Value Date   NA 140 09/22/2018   NA 140  09/22/2018   NA 133 (L) 09/02/2018   K 4.0 09/22/2018   K 3.9 09/22/2018   K 4.0 09/02/2018   CL 111 09/22/2018   CL 110 09/22/2018   CL 103 09/02/2018   CO2 19 (L) 09/22/2018   CO2 22 09/22/2018   CO2 24 09/02/2018   GLUCOSE 243 (H) 09/22/2018   GLUCOSE 227 (H) 09/22/2018   GLUCOSE 312 (H) 09/02/2018   BUN 9 09/22/2018   BUN 10 09/22/2018   BUN 17 09/02/2018   CREATININE 0.74 09/22/2018   CREATININE 0.63 09/22/2018   CREATININE 0.69 09/02/2018   CALCIUM 8.6 (L) 09/22/2018   CALCIUM 8.2 (L) 09/22/2018   CALCIUM 8.1 (L) 09/02/2018   LFT Recent Labs    09/22/18 1420 09/23/18 0226  PROT 6.3 5.8*  ALBUMIN 3.0* 2.5*  AST 36 32  ALT 38* 37  ALKPHOS 165* 117  BILITOT 1.6* 1.3*  BILIDIR 0.6* 0.4*  IBILI  --  0.9   PT/INR Lab  Results  Component Value Date   INR 1.4 (H) 08/27/2018   INR 1.1 07/12/2018   INR 1.38 06/15/2017   Hepatitis Panel No results for input(s): HEPBSAG, HCVAB, HEPAIGM, HEPBIGM in the last 72 hours. C-Diff No components found for: CDIFF Lipase     Component Value Date/Time   LIPASE 56 (H) 12/28/2017 1949    Drugs of Abuse     Component Value Date/Time   LABOPIA NONE DETECTED 06/30/2017 0239   COCAINSCRNUR NONE DETECTED 06/30/2017 0239   LABBENZ NONE DETECTED 06/30/2017 0239   AMPHETMU NONE DETECTED 06/30/2017 0239   THCU NONE DETECTED 06/30/2017 0239   LABBARB NONE DETECTED 06/30/2017 0239     RADIOLOGY STUDIES: Dg Chest 2 View  Result Date: 09/22/2018 CLINICAL DATA:  Shortness of breath EXAM: CHEST - 2 VIEW COMPARISON:  Chest radiograph August 27, 2018 and chest CT August 28, 2018 FINDINGS: There remains generalized interstitial prominence, largely due to pulmonary fibrosis. No airspace consolidation. Heart size and pulmonary vascularity are normal. No adenopathy. No bone lesions. IMPRESSION: Fairly diffuse interstitial prominence which is felt to be due primarily to interstitial fibrosis. There is not felt to be cardiogenic edema. A mild degree of noncardiogenic edema superimposed is possible. Note that viral type pneumonitis may present in this manner. There is no airspace consolidation. Heart size normal. No adenopathy. Electronically Signed   By: Lowella Grip III M.D.   On: 09/22/2018 14:31   Ct Angio Chest Pe W/cm &/or Wo Cm  Result Date: 09/22/2018 CLINICAL DATA:  Interstitial lung disease. Dyspnea. Elevated D-dimer. EXAM: CT ANGIOGRAPHY CHEST WITH CONTRAST TECHNIQUE: Multidetector CT imaging of the chest was performed using the standard protocol during bolus administration of intravenous contrast. Multiplanar CT image reconstructions and MIPs were obtained to evaluate the vascular anatomy. Examination repeated once due to poor contrast opacification on the initial attempt.  CONTRAST:  246m OMNIPAQUE IOHEXOL 300 MG/ML  SOLN COMPARISON:  Chest radiograph from earlier today. 08/28/2018 chest CT. FINDINGS: Cardiovascular: The study is low to moderate quality for the evaluation of pulmonary embolism, with motion degradation and suboptimal contrast opacification. There are no convincing filling defects in the central, lobar, segmental or subsegmental pulmonary artery branches to suggest acute pulmonary embolism. Atherosclerotic nonaneurysmal thoracic aorta. Normal caliber pulmonary arteries. Top-normal heart size. No significant pericardial fluid/thickening. Mediastinum/Nodes: Stable hypodense 3.3 cm thyroid isthmus nodule with minimal peripheral calcification. Unremarkable esophagus. No pathologically enlarged axillary, mediastinal or hilar lymph nodes. Lungs/Pleura: No pneumothorax. No pleural effusion. No acute consolidative airspace disease,  lung masses or significant pulmonary nodules. Mild patchy confluent subpleural reticulation and ground-glass attenuation throughout both lungs with associated mild architectural distortion, not substantially changed from 04/05/2018 high-resolution chest CT study. Upper abdomen: New small to moderate volume upper abdominal ascites. Irregular liver surface compatible with cirrhosis. Partially visualized splenomegaly. Musculoskeletal: No aggressive appearing focal osseous lesions. Mild thoracic spondylosis. Review of the MIP images confirms the above findings. IMPRESSION: 1. Limited scan.  No convincing evidence of pulmonary embolism. 2. Cirrhosis.  New small to moderate volume upper abdominal ascites. 3. Spectrum of findings compatible with chronic interstitial lung disease, not substantially changed since 04/05/2018 high-resolution chest CT study. No acute superimposed pulmonary disease. Aortic Atherosclerosis (ICD10-I70.0). Electronically Signed   By: Ilona Sorrel M.D.   On: 09/22/2018 19:04   Ct Renal Stone Study  Result Date:  09/23/2018 CLINICAL DATA:  Flank pain, stone disease suspected EXAM: CT ABDOMEN AND PELVIS WITHOUT CONTRAST TECHNIQUE: Multidetector CT imaging of the abdomen and pelvis was performed following the standard protocol without IV contrast. Patient IV contrast 6 hours prior for chest CTA. COMPARISON:  Radiograph 08/29/2018, noncontrast CT 05/26/2012 FINDINGS: Lower chest: Basilar interstitial lung disease as seen on chest CT 6 hours ago, unchanged. Hepatobiliary: Hepatic cirrhosis with nodular contours. No focal lesion on noncontrast exam. Contracted gallbladder without calcified gallstone. No biliary dilatation. Pancreas: No ductal dilatation or inflammation. Spleen: Splenomegaly with spleen spanning 18.8 x 8.6 x 18.2 cm. Previous splenic cyst is no longer seen. Adrenals/Urinary Tract: No obvious adrenal nodule, upper abdominal varices and lack contrast partially obscure evaluation. No hydronephrosis. Excreted IV contrast within both renal collecting systems from prior chest CTA. No perinephric edema. No obvious renal stone, detailed evaluation limited by excreted contrast. Excreted contrast in the urinary bladder without wall thickening. Stomach/Bowel: Bowel evaluation is limited in the absence of enteric contrast and presence of intra-abdominal ascites. There perigastric and probable paraesophageal varices. Stomach physiologically distended. No bowel obstruction or obvious inflammatory change. Mild distal colonic diverticulosis without diverticulitis. Vascular/Lymphatic: Upper abdominal varices, largest in the left upper quadrant. Abdominal aorta is normal in caliber. Limited assessment for adenopathy given ascites and lack contrast. Enlarged lymph nodes in the porta hepatis. Reproductive: Uterus and bilateral adnexa are unremarkable. Other: Moderate volume abdominopelvic ascites. Mild mesenteric ascites and mesenteric edema. Whole body wall edema, confluent in the abdominal pannus and flanks. No free air. Repair  prior umbilical hernia. Musculoskeletal: Chronic bilateral L5 pars interarticularis defects with grade 1 anterolisthesis of L5 on S1. There are no acute or suspicious osseous abnormalities. IMPRESSION: 1. No obstructive uropathy. Excreted IV contrast in the renal collecting systems from prior chest CT limits detailed assessment for renal stone. 2. Hepatic cirrhosis with portal hypertension and splenomegaly. Moderate volume abdominopelvic ascites. Upper abdominal and perigastric varices. 3. Chronic bilateral L5 pars interarticularis defects with grade 1 anterolisthesis of L5 on S1. Electronically Signed   By: [name] M.D.   On: 09/23/2018 01:01     IMPRESSION:   *    Cirrhosis of the liver due to [place].   Decompensated with ascites. SBP criteria not met  per fluid studies.   Diuresing with Lasix IV >> po.    *      Normocytic anemia.  Reports of a few days of dark stools 2 weeks ago.  FOBT was negative during hospitalization in late April 2020.  *    Hx pancytopenia.  Splenomegaly long-standing   *     Hx GERD.  *   Interstitial lung disease  on Prednisone taper    PLAN:     *   Obtain PT/INR in the morning  *     Patient will need EGD for evaluation of anemia and varices. ? Timing.    *   OK to feed pt, low Na diet ordered.    *   Adding Aldactone 100 mg/day.  Added to Lasix 60 mg po BID.  Follow renal function.     Azucena Freed  09/23/2018, 9:32 AM Phone [phone]     Attending Physician's Attestation   I have taken an interval history, reviewed the chart and examined the patient.   This is a patient that is reestablished with  gastro-after being evaluated by Dr. Therisa Doyne.  Looks like she has had a overt decompensation of her underlying liver disease most likely and has developed significant ascites and weight gain.  She feels better after her tap.  We will adjust her diuretics and monitor her kidney function closely.  May want to consider an endoscopy while  in-house to reevaluate for any evidence of esophageal varices.  Hemoglobin down trended slightly and to received packed RBC today.  The patient is inciting factor for progressive decompensation is not clear based on imaging there is no evidence of a new mass/lesion.  She may have progressive PBC and there looks like they were working on considering or trying to get ursodiol on some sort of compassionate program she states they have not been able to successfully do that as of yet.  Other etiologies such as kidney dysfunction and underlying cardiac disease and nephrotic syndrome should be considered.  Echo has been ordered but not completed as of yet.  I agree with the Advanced Practitioner's note, impression, and recommendations with updates and my documentation above.   Justice Britain, MD [facility] Advanced Endoscopy Office # [phone]

## 2018-09-23 NOTE — Progress Notes (Signed)
PROGRESS NOTE  Holly Mueller DPO:[id number] DOB: July 17, 1965 DOA: 09/22/2018 PCP: [name], MD   LOS: 1 day   Brief Narrative / Interim history: 53 year old female history of ILD followed by pulmonology as an outpatient, recently admitted for hemoptysis and currently on prednisone, history of PVC with liver cirrhosis, chronic anemia and pancytopenia, diabetes mellitus, hypothyroidism, came to the hospital with significant fluid overload and 30 pound weight gain along with progressive shortness of breath with minimal exertion.  She was seen in the pulmonology clinic day prior to admission and was sent to the hospital.  Subjective: Patient does not complain of significant shortness of breath at rest but she is barely able to take a few steps before needing to stop as she gets very dyspneic.  She denies any chest pain.  She complains of progressive lower extremity swelling following her discharge couple of weeks ago.  She is also reporting black stools last week that lasted for 3 to 4 days and this appears to have been resolved.  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  She was not on diuretics prior to admission, she was given IV furosemide on admission and placed on 60 twice daily.  Gastroenterology was consulted, appreciate input, will likely need to go on furosemide and spironolactone  Active Problems Liver cirrhosis, decompensated with anasarca and ascites -Status post IR guided paracentesis this morning, 1.8 L removed.  Labs pending -Continue  diuretics. -INR not checked on admission, cannot calculate meld.  GI to see.  Pancytopenia -Likely due to cirrhosis, she has been having worsening hemoglobin, currently 6.5 requiring unit of packed red blood cells.  Patient describes having black stools last week.  Last upper endoscopy was in 2015 by Dr. Carlean Purl.  GI consulted again due to concern for acute on chronic GI bleed, appreciate input -Keep n.p.o. until evaluated by GI  Type 2 diabetes mellitus -Continue Lantus and sliding scale  Abdominal pain -?  Related to SBP, paracentesis done this morning and labs are pending  Hypothyroidism -continue Synthroid   Scheduled Meds:  furosemide  60 mg Oral BID   insulin aspart  0-9 Units Subcutaneous Q6H   insulin glargine  25 Units Subcutaneous QHS   levothyroxine  250 mcg Oral Q0600   lidocaine       loratadine  10 mg Oral Daily   [START ON 09/26/2018] pantoprazole  40 mg Intravenous Q12H   predniSONE  10 mg Oral Q breakfast   Continuous Infusions:  cefTRIAXone (ROCEPHIN)  IV 1 g (09/23/18 0137)   pantoprozole (PROTONIX) infusion 8 mg/hr (09/23/18 0626)   PRN Meds:.albuterol, lidocaine, ondansetron **OR** ondansetron (ZOFRAN) IV, traMADol  DVT prophylaxis: SCDs Code Status: Full code Family Communication: no family at bedside  Disposition Plan: home when ready   Consultants:   GI  Procedures:   None   Antimicrobials:  Ceftriaxone 5/14 >>   Objective: Vitals:   09/23/18 0545 09/23/18 0546 09/23/18 0905 09/23/18 0908  BP: 124/70 124/70 (!) 97/53 (!) 97/56  Pulse: 88 88 88 86  Resp: _0 Temp: 98.8  F (37.1 C) 98.9 F (37.2 C) 98.5 F (36.9 C) 98.5 F (36.9 C)  TempSrc: Oral Oral Oral Oral  SpO2: 96% 96%  94%  Weight:      Height:        Intake/Output Summary (Last 24 hours) at 09/23/2018 1115 Last data filed at 09/23/2018 0905 Gross per 24 hour  Intake 615 ml  Output 1000 ml  Net -385 ml   Filed Weights   09/22/18 1529 09/23/18 0456   Weight: 126.7 kg 120.8 kg    Examination: Constitutional: NAD Eyes: PERRL, lids and conjunctivae normal, no scleral icterus ENMT: Mucous membranes are moist. No oropharyngeal exudates Neck: normal, supple Respiratory: clear to auscultation bilaterally, no wheezing, no crackles. Normal respiratory effort.  Cardiovascular: Regular rate and rhythm, no murmurs / rubs / gallops. No LE edema.  Abdomen: no tenderness. Bowel sounds positive.  Musculoskeletal: no clubbing / cyanosis.  Skin: no rashes Neurologic: CN 2-12 grossly intact. Strength 5/5 in all 4.  Psychiatric: Normal judgment and insight. Alert and oriented x 3. Normal mood.    Data Reviewed: I have independently reviewed following labs and imaging studies   CBC: Recent Labs  Lab 09/22/18 1420 09/22/18 1531 09/22/18 2349 09/23/18 0226  WBC 1.7 Repeated and verified X2.* 2.0* 2.1* 2.1*  NEUTROABS 1.2*  --   --  1.0*  HGB 7.7 Repeated and verified X2.* 7.7* 7.1* 6.5*  HCT 23.6 Repeated and verified X2.* 25.5* 23.3* 21.3*  MCV 81.4 87.6 83.8 83.9  PLT 67.0 Repeated and verified X2.* 79* 74* 68*   Basic Metabolic Panel: Recent Labs  Lab 09/22/18 1420 09/22/18 1531  NA 140 140  K 3.9 4.0  CL 110 111  CO2 22 19*  GLUCOSE 227* 243*  BUN 10 9  CREATININE 0.63 0.74  CALCIUM 8.2* 8.6*   GFR: Estimated Creatinine Clearance: 107.7 mL/min (by C-G formula based on SCr of 0.74 mg/dL). Liver Function Tests: Recent Labs  Lab 09/22/18 1420 09/23/18 0226  AST 36 32  ALT 38* 37  ALKPHOS 165* 117  BILITOT 1.6* 1.3*  PROT 6.3 5.8*  ALBUMIN 3.0* 2.5*   No results for input(s): LIPASE, AMYLASE in the last 168 hours. No results for input(s): AMMONIA in the last 168 hours. Coagulation Profile: No results for input(s): INR, PROTIME in the last 168 hours. Cardiac Enzymes: Recent Labs  Lab 09/22/18 2349  TROPONINI <0.03   BNP (last 3 results) Recent Labs    09/22/18 1420  PROBNP 35.0   HbA1C: No results for  input(s): HGBA1C in the last 72 hours. CBG: Recent Labs  Lab 09/22/18 2151 09/23/18 0635  GLUCAP 116* 82   Lipid Profile: No results for input(s): CHOL, HDL, LDLCALC, TRIG, CHOLHDL, LDLDIRECT in the last 72 hours. Thyroid Function Tests: Recent Labs    09/23/18 0226  TSH 0.181*   Anemia Panel: No results for input(s): VITAMINB12, FOLATE, FERRITIN, TIBC, IRON, RETICCTPCT in the last 72 hours. Urine analysis:    Component Value Date/Time   COLORURINE YELLOW 12/28/2017 1950   APPEARANCEUR CLEAR 12/28/2017 1950   LABSPEC 1.012 12/28/2017 1950   PHURINE 7.0 12/28/2017 1950   GLUCOSEU NEGATIVE 12/28/2017 1950   HGBUR SMALL (A) 12/28/2017 1950   BILIRUBINUR NEGATIVE 12/28/2017 1950   KETONESUR NEGATIVE 12/28/2017 1950   PROTEINUR NEGATIVE 12/28/2017 1950   UROBILINOGEN 2.0 (H) 11/22/2013 1743   NITRITE NEGATIVE 12/28/2017 1950   LEUKOCYTESUR NEGATIVE 12/28/2017 1950   Sepsis Labs: Invalid input(s): PROCALCITONIN, LACTICIDVEN  Recent Results (from the  past 240 hour(s))  SARS Coronavirus 2 (CEPHEID - Performed in [place] hospital lab), Hosp Order     Status: None   Collection Time: 09/22/18  8:15 PM  Result Value Ref Range Status   SARS Coronavirus 2 NEGATIVE NEGATIVE Final    Comment: (NOTE) If result is NEGATIVE SARS-CoV-2 target nucleic acids are NOT DETECTED. The SARS-CoV-2 RNA is generally detectable in upper and lower  respiratory specimens during the acute phase of infection. The lowest  concentration of SARS-CoV-2 viral copies this assay can detect is 250  copies / mL. A negative result does not preclude SARS-CoV-2 infection  and should not be used as the sole basis for treatment or other  patient management decisions.  A negative result may occur with  improper specimen collection / handling, submission of specimen other  than nasopharyngeal swab, presence of viral mutation(s) within the  areas targeted by this assay, and inadequate number of viral copies    (<250 copies / mL). A negative result must be combined with clinical  observations, patient history, and epidemiological information. If result is POSITIVE SARS-CoV-2 target nucleic acids are DETECTED. The SARS-CoV-2 RNA is generally detectable in upper and lower  respiratory specimens dur ing the acute phase of infection.  Positive  results are indicative of active infection with SARS-CoV-2.  Clinical  correlation with patient history and other diagnostic information is  necessary to determine patient infection status.  Positive results do  not rule out bacterial infection or co-infection with other viruses. If result is PRESUMPTIVE POSTIVE SARS-CoV-2 nucleic acids MAY BE PRESENT.   A presumptive positive result was obtained on the submitted specimen  and confirmed on repeat testing.  While 2019 novel coronavirus  (SARS-CoV-2) nucleic acids may be present in the submitted sample  additional confirmatory testing may be necessary for epidemiological  and / or clinical management purposes  to differentiate between  SARS-CoV-2 and other Sarbecovirus currently known to infect humans.  If clinically indicated additional testing with an alternate test  methodology [phone]) is advised. The SARS-CoV-2 RNA is generally  detectable in upper and lower respiratory sp ecimens during the acute  phase of infection. The expected result is Negative. Fact Sheet for Patients:  StrictlyIdeas.no Fact Sheet for Healthcare Providers: BankingDealers.co.za This test is not yet approved or cleared by the [place] FDA and has been authorized for detection and/or diagnosis of SARS-CoV-2 by FDA under an Emergency Use Authorization (EUA).  This EUA will remain in effect (meaning this test can be used) for the duration of the COVID-19 declaration under Section 564(b)(1) of the Act, 21 U.S.C. section 360bbb-3(b)(1), unless the authorization is terminated  or revoked sooner. Performed at [facility], [place] [address] [place], [place] [address]       Radiology Studies: Dg Chest 2 View  Result Date: 09/22/2018 CLINICAL DATA:  Shortness of breath EXAM: CHEST - 2 VIEW COMPARISON:  Chest radiograph August 27, 2018 and chest CT August 28, 2018 FINDINGS: There remains generalized interstitial prominence, largely due to pulmonary fibrosis. No airspace consolidation. Heart size and pulmonary vascularity are normal. No adenopathy. No bone lesions. IMPRESSION: Fairly diffuse interstitial prominence which is felt to be due primarily to interstitial fibrosis. There is not felt to be cardiogenic edema. A mild degree of noncardiogenic edema superimposed is possible. Note that viral type pneumonitis may present in this manner. There is no airspace consolidation. Heart size normal. No adenopathy. Electronically Signed   By: Lowella Grip III M.D.  On: 09/22/2018 14:31   Ct Angio Chest Pe W/cm &/or Wo Cm  Result Date: 09/22/2018 CLINICAL DATA:  Interstitial lung disease. Dyspnea. Elevated D-dimer. EXAM: CT ANGIOGRAPHY CHEST WITH CONTRAST TECHNIQUE: Multidetector CT imaging of the chest was performed using the standard protocol during bolus administration of intravenous contrast. Multiplanar CT image reconstructions and MIPs were obtained to evaluate the vascular anatomy. Examination repeated once due to poor contrast opacification on the initial attempt. CONTRAST:  239m OMNIPAQUE IOHEXOL 300 MG/ML  SOLN COMPARISON:  Chest radiograph from earlier today. 08/28/2018 chest CT. FINDINGS: Cardiovascular: The study is low to moderate quality for the evaluation of pulmonary embolism, with motion degradation and suboptimal contrast opacification. There are no convincing filling defects in the central, lobar, segmental or subsegmental pulmonary artery branches to suggest acute pulmonary embolism. Atherosclerotic nonaneurysmal thoracic aorta. Normal caliber pulmonary  arteries. Top-normal heart size. No significant pericardial fluid/thickening. Mediastinum/Nodes: Stable hypodense 3.3 cm thyroid isthmus nodule with minimal peripheral calcification. Unremarkable esophagus. No pathologically enlarged axillary, mediastinal or hilar lymph nodes. Lungs/Pleura: No pneumothorax. No pleural effusion. No acute consolidative airspace disease, lung masses or significant pulmonary nodules. Mild patchy confluent subpleural reticulation and ground-glass attenuation throughout both lungs with associated mild architectural distortion, not substantially changed from 04/05/2018 high-resolution chest CT study. Upper abdomen: New small to moderate volume upper abdominal ascites. Irregular liver surface compatible with cirrhosis. Partially visualized splenomegaly. Musculoskeletal: No aggressive appearing focal osseous lesions. Mild thoracic spondylosis. Review of the MIP images confirms the above findings. IMPRESSION: 1. Limited scan.  No convincing evidence of pulmonary embolism. 2. Cirrhosis.  New small to moderate volume upper abdominal ascites. 3. Spectrum of findings compatible with chronic interstitial lung disease, not substantially changed since 04/05/2018 high-resolution chest CT study. No acute superimposed pulmonary disease. Aortic Atherosclerosis (ICD10-I70.0). Electronically Signed   By: JIlona SorrelM.D.   On: 09/22/2018 19:04   Ct Renal Stone Study  Result Date: 09/23/2018 CLINICAL DATA:  Flank pain, stone disease suspected EXAM: CT ABDOMEN AND PELVIS WITHOUT CONTRAST TECHNIQUE: Multidetector CT imaging of the abdomen and pelvis was performed following the standard protocol without IV contrast. Patient IV contrast 6 hours prior for chest CTA. COMPARISON:  Radiograph 08/29/2018, noncontrast CT 05/26/2012 FINDINGS: Lower chest: Basilar interstitial lung disease as seen on chest CT 6 hours ago, unchanged. Hepatobiliary: Hepatic cirrhosis with nodular contours. No focal lesion on  noncontrast exam. Contracted gallbladder without calcified gallstone. No biliary dilatation. Pancreas: No ductal dilatation or inflammation. Spleen: Splenomegaly with spleen spanning 18.8 x 8.6 x 18.2 cm. Previous splenic cyst is no longer seen. Adrenals/Urinary Tract: No obvious adrenal nodule, upper abdominal varices and lack contrast partially obscure evaluation. No hydronephrosis. Excreted IV contrast within both renal collecting systems from prior chest CTA. No perinephric edema. No obvious renal stone, detailed evaluation limited by excreted contrast. Excreted contrast in the urinary bladder without wall thickening. Stomach/Bowel: Bowel evaluation is limited in the absence of enteric contrast and presence of intra-abdominal ascites. There perigastric and probable paraesophageal varices. Stomach physiologically distended. No bowel obstruction or obvious inflammatory change. Mild distal colonic diverticulosis without diverticulitis. Vascular/Lymphatic: Upper abdominal varices, largest in the left upper quadrant. Abdominal aorta is normal in caliber. Limited assessment for adenopathy given ascites and lack contrast. Enlarged lymph nodes in the porta hepatis. Reproductive: Uterus and bilateral adnexa are unremarkable. Other: Moderate volume abdominopelvic ascites. Mild mesenteric ascites and mesenteric edema. Whole body wall edema, confluent in the abdominal pannus and flanks. No free air. Repair prior umbilical hernia. Musculoskeletal: Chronic bilateral  L5 pars interarticularis defects with grade 1 anterolisthesis of L5 on S1. There are no acute or suspicious osseous abnormalities. IMPRESSION: 1. No obstructive uropathy. Excreted IV contrast in the renal collecting systems from prior chest CT limits detailed assessment for renal stone. 2. Hepatic cirrhosis with portal hypertension and splenomegaly. Moderate volume abdominopelvic ascites. Upper abdominal and perigastric varices. 3. Chronic bilateral L5 pars  interarticularis defects with grade 1 anterolisthesis of L5 on S1. Electronically Signed   By: [name] M.D.   On: 09/23/2018 01:01    Marzetta Board, MD, PhD [facility]  Contact via  [email or web address]  [place] P: [phone]  F: [phone]

## 2018-09-24 ENCOUNTER — Inpatient Hospital Stay (HOSPITAL_COMMUNITY): Payer: Medicaid Other

## 2018-09-24 DIAGNOSIS — R0609 Other forms of dyspnea: Secondary | ICD-10-CM

## 2018-09-24 LAB — COMPREHENSIVE METABOLIC PANEL
ALT: 35 U/L (ref 0–44)
AST: 35 U/L (ref 15–41)
Albumin: 2.6 g/dL — ABNORMAL LOW (ref 3.5–5.0)
Alkaline Phosphatase: 90 U/L (ref 38–126)
Anion gap: 9 (ref 5–15)
BUN: 9 mg/dL (ref 6–20)
CO2: 24 mmol/L (ref 22–32)
Calcium: 8.1 mg/dL — ABNORMAL LOW (ref 8.9–10.3)
Chloride: 107 mmol/L (ref 98–111)
Creatinine, Ser: 0.73 mg/dL (ref 0.44–1.00)
GFR calc Af Amer: >60 mL/min (ref >60)
GFR calc non Af Amer: >60 mL/min (ref >60)
Glucose, Bld: 113 mg/dL — ABNORMAL HIGH (ref 70–99)
Potassium: 3.2 mmol/L — ABNORMAL LOW (ref 3.5–5.1)
Sodium: 140 mmol/L (ref 135–145)
Total Bilirubin: 1.7 mg/dL — ABNORMAL HIGH (ref 0.3–1.2)
Total Protein: 6 g/dL — ABNORMAL LOW (ref 6.5–8.1)

## 2018-09-24 LAB — CBC
HCT: 24.2 % — ABNORMAL LOW (ref 36.0–46.0)
Hemoglobin: 7.6 g/dL — ABNORMAL LOW (ref 12.0–15.0)
MCH: 26 pg (ref 26.0–34.0)
MCHC: 31.4 g/dL (ref 30.0–36.0)
MCV: 82.9 fL (ref 80.0–100.0)
Platelets: 78 10*3/uL — ABNORMAL LOW (ref 150–400)
RBC: 2.92 MIL/uL — ABNORMAL LOW (ref 3.87–5.11)
RDW: 15.1 % (ref 11.5–15.5)
WBC: 1.6 10*3/uL — ABNORMAL LOW (ref 4.0–10.5)
nRBC: 0 % (ref 0.0–0.2)

## 2018-09-24 LAB — ECHOCARDIOGRAM COMPLETE
Height: 66 in
Weight: 4261.05 oz

## 2018-09-24 LAB — PROTIME-INR
INR: 1.3 — ABNORMAL HIGH (ref 0.8–1.2)
Prothrombin Time: 15.6 seconds — ABNORMAL HIGH (ref 11.4–15.2)

## 2018-09-24 LAB — GLUCOSE, CAPILLARY
Glucose-Capillary: 86 mg/dL (ref 70–99)
Glucose-Capillary: 201 mg/dL — ABNORMAL HIGH (ref 70–99)
Glucose-Capillary: 150 mg/dL — ABNORMAL HIGH (ref 70–99)
Glucose-Capillary: 182 mg/dL — ABNORMAL HIGH (ref 70–99)
Glucose-Capillary: 229 mg/dL — ABNORMAL HIGH (ref 70–99)

## 2018-09-24 MED ORDER — POTASSIUM CHLORIDE CRYS ER 20 MEQ PO TBCR
20.0000 meq | EXTENDED_RELEASE_TABLET | Freq: Once | ORAL | Status: AC
  Administered 2018-09-24: 20 meq via ORAL
  Filled 2018-09-24: qty 1

## 2018-09-24 MED ORDER — INSULIN ASPART 100 UNIT/ML ~~LOC~~ SOLN
0.0000 [IU] | Freq: Three times a day (TID) | SUBCUTANEOUS | Status: DC
  Administered 2018-09-24: 17:00:00 2 [IU] via SUBCUTANEOUS
  Administered 2018-09-24: 12:00:00 1 [IU] via SUBCUTANEOUS
  Administered 2018-09-24: 22:00:00 3 [IU] via SUBCUTANEOUS
  Administered 2018-09-25: 2 [IU] via SUBCUTANEOUS
  Administered 2018-09-25: 3 [IU] via SUBCUTANEOUS
  Administered 2018-09-25: 1 [IU] via SUBCUTANEOUS
  Administered 2018-09-26 (×2): 2 [IU] via SUBCUTANEOUS

## 2018-09-24 MED ORDER — DM-GUAIFENESIN ER 30-600 MG PO TB12
1.0000 | ORAL_TABLET | Freq: Two times a day (BID) | ORAL | Status: DC
  Administered 2018-09-24 – 2018-09-26 (×5): 1 via ORAL
  Filled 2018-09-24 (×2): qty 1
  Filled 2018-09-24: qty 2
  Filled 2018-09-24 (×2): qty 1

## 2018-09-24 NOTE — Progress Notes (Signed)
PROGRESS NOTE  Holly Mueller UVO:[id number] DOB: Sep 24, 1965 DOA: 09/22/2018 PCP: [name], MD   LOS: 2 days   Brief Narrative / Interim history: 53 year old female history of ILD followed by pulmonology as an outpatient, recently admitted for hemoptysis and currently on prednisone, history of PVC with liver cirrhosis, chronic anemia and pancytopenia, diabetes mellitus, hypothyroidism, came to the hospital with significant fluid overload and 30 pound weight gain along with progressive shortness of breath with minimal exertion.  She was seen in the pulmonology clinic day prior to admission and was sent to the hospital.  Subjective: Feeling better following the paracentesis yesterday, she feels like she can take deeper breaths.  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  She was not on diuretics prior to admission, she was given IV furosemide on admission and placed on 60 twice daily. -Currently on furosemide along with spironolactone, she is net -3.1 L, renal function remains stable and blood pressure is tolerable.  Continue to closely monitor.  GI following.  Active Problems Liver cirrhosis, decompensated with anasarca and ascites -Status post IR guided paracentesis on 5/15, findings consistent with ascites due to portal hypertension, cell count shows no evidence of SBP  Pulmonary fibrosis -Followed by pulmonology as an outpatient, diagnosis of idiopathic pulmonary fibrosis  versus an NSIP versus ILD with autoimmune features.  On prednisone currently.  Recently hospitalized for hemoptysis, no hemoptysis this time around. -Cough increasing this morning, will add Mucinex which works for her  Pancytopenia -Likely due to cirrhosis, she has been having worsening hemoglobin, 6.5 on admission requiring unit of packed red blood cells.  Patient describes having black stools last week.  Last upper endoscopy was in 2015 by Dr. Carlean Purl.  GI consulted again due to concern for acute on chronic GI bleed, appreciate input -GI considering repeat endoscopy prior to discharge.  Hemoglobin improved appropriately to 7.8 following transfusion, 7.6 this morning.  No clinical evidence of ongoing bleed.  Continue PPI and ceftriaxone meanwhile  Type 2 diabetes mellitus -Continue Lantus and sliding scale, CBGs relatively well-controlled 150-200.  Keep on same regimen.  Abdominal pain -Unlikely related to SBP, she has been history of chronic cough and may be related to that.  Hypothyroidism -continue Synthroid   Scheduled Meds:  dextromethorphan-guaiFENesin  1 tablet Oral BID   furosemide  60 mg Oral BID   insulin aspart  0-9 Units Subcutaneous TID AC & HS   insulin glargine  25 Units Subcutaneous QHS   levothyroxine  250 mcg Oral Q0600   loratadine  10 mg Oral Daily   [START ON 09/26/2018] pantoprazole  40 mg Intravenous Q12H   predniSONE  10 mg Oral Q breakfast   spironolactone  100 mg Oral Daily   Continuous Infusions:  cefTRIAXone (ROCEPHIN)  IV Stopped (09/24/18 0030)   pantoprozole (PROTONIX) infusion 8 mg/hr (09/23/18 0626)   PRN Meds:.albuterol, lidocaine, ondansetron **OR** ondansetron (ZOFRAN) IV, traMADol  DVT prophylaxis: SCDs Code Status: Full code Family Communication: no family at bedside  Disposition Plan:  home when ready   Consultants:   GI  Procedures:   None   Antimicrobials:  Ceftriaxone 5/14 >>   Objective: Vitals:   09/23/18 0908  09/23/18 1420 09/23/18 2200 09/24/18 0544  BP: (!) 97/56 96/75 100/63 107/66  Pulse: 86 89 89 84  Resp: _0 Temp: 98.5 F (36.9 C) (!) 97.5 F (36.4 C) 98.8 F (37.1 C) 98.8 F (37.1 C)  TempSrc: Oral Oral Oral Oral  SpO2: 94% 97% 96% 95%  Weight:      Height:        Intake/Output Summary (Last 24 hours) at 09/24/2018 1320 Last data filed at 09/24/2018 0800 Gross per 24 hour  Intake 1668 ml  Output 3400 ml  Net -1732 ml   Filed Weights   09/22/18 1529 09/23/18 0456  Weight: 126.7 kg 120.8 kg    Examination: Constitutional: No distress, sitting in bed Eyes: No scleral icterus seen ENMT: mmm, presentation Neck: normal, supple Respiratory: Lungs mostly clear to auscultation however overall distant breath sounds, no wheezing or crackles heard.  Normal respiratory effort Cardiovascular: Regular rate and rhythm, no murmurs appreciated.  2+ pitting lower extremity edema Abdomen: Soft, nontender, nondistended, bowel sounds positive Musculoskeletal: no clubbing / cyanosis.  Skin: No rashes seen Neurologic: No focal deficits, equal strength Psychiatric: Normal judgment and insight. Alert and oriented x 3. Normal mood.    Data Reviewed: I have independently reviewed following labs and imaging studies   CBC: Recent Labs  Lab 09/22/18 1420 09/22/18 1531 09/22/18 2349 09/23/18 0226 09/23/18 1148 09/24/18 0223  WBC 1.7 Repeated and verified X2.* 2.0* 2.1* 2.1* 1.8* 1.6*  NEUTROABS 1.2*  --   --  1.0*  --   --   HGB 7.7 Repeated and verified X2.* 7.7* 7.1* 6.5* 7.8* 7.6*  HCT 23.6 Repeated and verified X2.* 25.5* 23.3* 21.3* 25.4* 24.2*  MCV 81.4 87.6 83.8 83.9 83.3 82.9  PLT 67.0 Repeated and verified X2.* 79* 74* 68* 75* 78*   Basic Metabolic Panel: Recent Labs  Lab 09/22/18 1420 09/22/18 1531 09/24/18 0223  NA 140 140 140  K 3.9 4.0 3.2*  CL 110 111 107  CO2 22 19* 24  GLUCOSE 227* 243* 113*  BUN _1 CREATININE 0.63 0.74 0.73  CALCIUM 8.2* 8.6*  8.1*   GFR: Estimated Creatinine Clearance: 107.7 mL/min (by C-G formula based on SCr of 0.73 mg/dL). Liver Function Tests: Recent Labs  Lab 09/22/18 1420 09/23/18 0226 09/24/18 0223  AST 36 32 35  ALT 38* 37 35  ALKPHOS 165* 117 90  BILITOT 1.6* 1.3* 1.7*  PROT 6.3 5.8* 6.0*  ALBUMIN 3.0* 2.5* 2.6*   No results for input(s): LIPASE, AMYLASE in the last 168 hours. No results for input(s): AMMONIA in the last 168 hours. Coagulation Profile: Recent Labs  Lab 09/24/18 0223  INR 1.3*   Cardiac Enzymes: Recent Labs  Lab 09/22/18 2349  TROPONINI <0.03   BNP (last 3 results) Recent Labs    09/22/18 1420  PROBNP 35.0   HbA1C: No results for input(s): HGBA1C in the last 72 hours. CBG: Recent Labs  Lab 09/23/18 1756 09/23/18 2341 09/24/18 0545 09/24/18 0845 09/24/18 1154  GLUCAP 195* 169* 86 201* 150*   Lipid Profile: No results for input(s): CHOL, HDL, LDLCALC, TRIG, CHOLHDL, LDLDIRECT in the last 72 hours. Thyroid Function Tests: Recent Labs    09/23/18 0226  TSH 0.181*   Anemia Panel: No results for input(s): VITAMINB12, FOLATE, FERRITIN, TIBC,  IRON, RETICCTPCT in the last 72 hours. Urine analysis:    Component Value Date/Time   COLORURINE YELLOW 12/28/2017 1950   APPEARANCEUR CLEAR 12/28/2017 1950   LABSPEC 1.012 12/28/2017 1950   PHURINE 7.0 12/28/2017 1950   GLUCOSEU NEGATIVE 12/28/2017 1950   HGBUR SMALL (A) 12/28/2017 1950   BILIRUBINUR NEGATIVE 12/28/2017 1950   KETONESUR NEGATIVE 12/28/2017 1950   PROTEINUR NEGATIVE 12/28/2017 1950   UROBILINOGEN 2.0 (H) 11/22/2013 1743   NITRITE NEGATIVE 12/28/2017 1950   LEUKOCYTESUR NEGATIVE 12/28/2017 1950   Sepsis Labs: Invalid input(s): PROCALCITONIN, LACTICIDVEN  Recent Results (from the past 240 hour(s))  SARS Coronavirus 2 (CEPHEID - Performed in [facility] hospital lab), Hosp Order     Status: None   Collection Time: 09/22/18  8:15 PM  Result Value Ref Range Status   SARS Coronavirus 2  NEGATIVE NEGATIVE Final    Comment: (NOTE) If result is NEGATIVE SARS-CoV-2 target nucleic acids are NOT DETECTED. The SARS-CoV-2 RNA is generally detectable in upper and lower  respiratory specimens during the acute phase of infection. The lowest  concentration of SARS-CoV-2 viral copies this assay can detect is 250  copies / mL. A negative result does not preclude SARS-CoV-2 infection  and should not be used as the sole basis for treatment or other  patient management decisions.  A negative result may occur with  improper specimen collection / handling, submission of specimen other  than nasopharyngeal swab, presence of viral mutation(s) within the  areas targeted by this assay, and inadequate number of viral copies  (<250 copies / mL). A negative result must be combined with clinical  observations, patient history, and epidemiological information. If result is POSITIVE SARS-CoV-2 target nucleic acids are DETECTED. The SARS-CoV-2 RNA is generally detectable in upper and lower  respiratory specimens dur ing the acute phase of infection.  Positive  results are indicative of active infection with SARS-CoV-2.  Clinical  correlation with patient history and other diagnostic information is  necessary to determine patient infection status.  Positive results do  not rule out bacterial infection or co-infection with other viruses. If result is PRESUMPTIVE POSTIVE SARS-CoV-2 nucleic acids MAY BE PRESENT.   A presumptive positive result was obtained on the submitted specimen  and confirmed on repeat testing.  While 2019 novel coronavirus  (SARS-CoV-2) nucleic acids may be present in the submitted sample  additional confirmatory testing may be necessary for epidemiological  and / or clinical management purposes  to differentiate between  SARS-CoV-2 and other Sarbecovirus currently known to infect humans.  If clinically indicated additional testing with an alternate test  methodology [id number]  is advised. The SARS-CoV-2 RNA is generally  detectable in upper and lower respiratory sp ecimens during the acute  phase of infection. The expected result is Negative. Fact Sheet for Patients:  StrictlyIdeas.no Fact Sheet for Healthcare Providers: BankingDealers.co.za This test is not yet approved or cleared by the [place] FDA and has been authorized for detection and/or diagnosis of SARS-CoV-2 by FDA under an Emergency Use Authorization (EUA).  This EUA will remain in effect (meaning this test can be used) for the duration of the COVID-19 declaration under Section 564(b)(1) of the Act, 21 U.S.C. section 360bbb-3(b)(1), unless the authorization is terminated or revoked sooner. Performed at [facility], [place] [address], [place], [place] [address]   Gram stain     Status: None   Collection Time: 09/23/18 10:38 AM  Result Value Ref Range Status   Specimen Description PERITONEAL  FLUID  Final   Special Requests NONE  Final   Gram Stain   Final    RARE WBC PRESENT, PREDOMINANTLY MONONUCLEAR NO ORGANISMS SEEN Performed at [facility], [place] [address] [place], [place] [address]    Report Status 09/23/2018 FINAL  Final  Culture, body fluid-bottle     Status: None (Preliminary result)   Collection Time: 09/23/18 10:38 AM  Result Value Ref Range Status   Specimen Description PERITONEAL fluid for immunophenotyping by flow  Final   Special Requests NONE  Final   Culture   Final    NO GROWTH < 24 HOURS Performed at [facility], [place] [address] [place], [place] [address]    Report Status PENDING  Incomplete      Radiology Studies: Dg Chest 2 View  Result Date: 09/22/2018 CLINICAL DATA:  Shortness of breath EXAM: CHEST - 2 VIEW COMPARISON:  Chest radiograph August 27, 2018 and chest CT August 28, 2018 FINDINGS: There remains generalized interstitial prominence, largely due to pulmonary fibrosis. No airspace  consolidation. Heart size and pulmonary vascularity are normal. No adenopathy. No bone lesions. IMPRESSION: Fairly diffuse interstitial prominence which is felt to be due primarily to interstitial fibrosis. There is not felt to be cardiogenic edema. A mild degree of noncardiogenic edema superimposed is possible. Note that viral type pneumonitis may present in this manner. There is no airspace consolidation. Heart size normal. No adenopathy. Electronically Signed   By: Lowella Grip III M.D.   On: 09/22/2018 14:31   Ct Angio Chest Pe W/cm &/or Wo Cm  Result Date: 09/22/2018 CLINICAL DATA:  Interstitial lung disease. Dyspnea. Elevated D-dimer. EXAM: CT ANGIOGRAPHY CHEST WITH CONTRAST TECHNIQUE: Multidetector CT imaging of the chest was performed using the standard protocol during bolus administration of intravenous contrast. Multiplanar CT image reconstructions and MIPs were obtained to evaluate the vascular anatomy. Examination repeated once due to poor contrast opacification on the initial attempt. CONTRAST:  260m OMNIPAQUE IOHEXOL 300 MG/ML  SOLN COMPARISON:  Chest radiograph from earlier today. 08/28/2018 chest CT. FINDINGS: Cardiovascular: The study is low to moderate quality for the evaluation of pulmonary embolism, with motion degradation and suboptimal contrast opacification. There are no convincing filling defects in the central, lobar, segmental or subsegmental pulmonary artery branches to suggest acute pulmonary embolism. Atherosclerotic nonaneurysmal thoracic aorta. Normal caliber pulmonary arteries. Top-normal heart size. No significant pericardial fluid/thickening. Mediastinum/Nodes: Stable hypodense 3.3 cm thyroid isthmus nodule with minimal peripheral calcification. Unremarkable esophagus. No pathologically enlarged axillary, mediastinal or hilar lymph nodes. Lungs/Pleura: No pneumothorax. No pleural effusion. No acute consolidative airspace disease, lung masses or significant pulmonary  nodules. Mild patchy confluent subpleural reticulation and ground-glass attenuation throughout both lungs with associated mild architectural distortion, not substantially changed from 04/05/2018 high-resolution chest CT study. Upper abdomen: New small to moderate volume upper abdominal ascites. Irregular liver surface compatible with cirrhosis. Partially visualized splenomegaly. Musculoskeletal: No aggressive appearing focal osseous lesions. Mild thoracic spondylosis. Review of the MIP images confirms the above findings. IMPRESSION: 1. Limited scan.  No convincing evidence of pulmonary embolism. 2. Cirrhosis.  New small to moderate volume upper abdominal ascites. 3. Spectrum of findings compatible with chronic interstitial lung disease, not substantially changed since 04/05/2018 high-resolution chest CT study. No acute superimposed pulmonary disease. Aortic Atherosclerosis (ICD10-I70.0). Electronically Signed   By: JIlona SorrelM.D.   On: 09/22/2018 19:04   Ct Renal Stone Study  Result Date: 09/23/2018 CLINICAL DATA:  Flank pain, stone disease suspected EXAM: CT ABDOMEN  AND PELVIS WITHOUT CONTRAST TECHNIQUE: Multidetector CT imaging of the abdomen and pelvis was performed following the standard protocol without IV contrast. Patient IV contrast 6 hours prior for chest CTA. COMPARISON:  Radiograph 08/29/2018, noncontrast CT 05/26/2012 FINDINGS: Lower chest: Basilar interstitial lung disease as seen on chest CT 6 hours ago, unchanged. Hepatobiliary: Hepatic cirrhosis with nodular contours. No focal lesion on noncontrast exam. Contracted gallbladder without calcified gallstone. No biliary dilatation. Pancreas: No ductal dilatation or inflammation. Spleen: Splenomegaly with spleen spanning 18.8 x 8.6 x 18.2 cm. Previous splenic cyst is no longer seen. Adrenals/Urinary Tract: No obvious adrenal nodule, upper abdominal varices and lack contrast partially obscure evaluation. No hydronephrosis. Excreted IV contrast  within both renal collecting systems from prior chest CTA. No perinephric edema. No obvious renal stone, detailed evaluation limited by excreted contrast. Excreted contrast in the urinary bladder without wall thickening. Stomach/Bowel: Bowel evaluation is limited in the absence of enteric contrast and presence of intra-abdominal ascites. There perigastric and probable paraesophageal varices. Stomach physiologically distended. No bowel obstruction or obvious inflammatory change. Mild distal colonic diverticulosis without diverticulitis. Vascular/Lymphatic: Upper abdominal varices, largest in the left upper quadrant. Abdominal aorta is normal in caliber. Limited assessment for adenopathy given ascites and lack contrast. Enlarged lymph nodes in the porta hepatis. Reproductive: Uterus and bilateral adnexa are unremarkable. Other: Moderate volume abdominopelvic ascites. Mild mesenteric ascites and mesenteric edema. Whole body wall edema, confluent in the abdominal pannus and flanks. No free air. Repair prior umbilical hernia. Musculoskeletal: Chronic bilateral L5 pars interarticularis defects with grade 1 anterolisthesis of L5 on S1. There are no acute or suspicious osseous abnormalities. IMPRESSION: 1. No obstructive uropathy. Excreted IV contrast in the renal collecting systems from prior chest CT limits detailed assessment for renal stone. 2. Hepatic cirrhosis with portal hypertension and splenomegaly. Moderate volume abdominopelvic ascites. Upper abdominal and perigastric varices. 3. Chronic bilateral L5 pars interarticularis defects with grade 1 anterolisthesis of L5 on S1. Electronically Signed   By: [name] M.D.   On: 09/23/2018 01:01   Ir Paracentesis  Result Date: 09/23/2018 INDICATION: History of cirrhosis. Abdominal distention and weight gain. Ascites. Request for diagnostic therapeutic paracentesis. EXAM: ULTRASOUND GUIDED RIGHT LOWER QUADRANT PARACENTESIS MEDICATIONS: None. COMPLICATIONS: None  immediate. PROCEDURE: Informed written consent was obtained from the patient after a discussion of the risks, benefits and alternatives to treatment. A timeout was performed prior to the initiation of the procedure. Initial ultrasound scanning demonstrates a small to moderate amount of ascites within the right lower abdominal quadrant. The right lower abdomen was prepped and draped in the usual sterile fashion. 1% lidocaine with epinephrine was used for local anesthesia. Following this, a 19 gauge, 15-cm, Yueh catheter was introduced. An ultrasound image was saved for documentation purposes. The paracentesis was performed. The catheter was removed and a dressing was applied. The patient tolerated the procedure well without immediate post procedural complication. FINDINGS: A total of approximately 1850 mL of hazy yellow fluid was removed. Samples were sent to the laboratory as requested by the clinical team. IMPRESSION: Successful ultrasound-guided paracentesis yielding 1850 mL of peritoneal fluid. Read by: Ascencion Dike PA-C Electronically Signed   By: Sandi Mariscal M.D.   On: 09/23/2018 11:50    Marzetta Board, MD, PhD [facility]  Contact via  [email or web address]  [place] P: [phone]  F: [phone]

## 2018-09-24 NOTE — Progress Notes (Signed)
  Echocardiogram 2D Echocardiogram has been performed.  Holly Mueller 09/24/2018, 3:45 PM

## 2018-09-25 LAB — BPAM RBC
Blood Product Expiration Date: 202005222359
ISSUE DATE / TIME: 202005150520
Unit Type and Rh: 5100

## 2018-09-25 LAB — TYPE AND SCREEN
ABO/RH(D): O POS
Antibody Screen: NEGATIVE
Unit division: 0

## 2018-09-25 LAB — CBC
HCT: 25.3 % — ABNORMAL LOW (ref 36.0–46.0)
Hemoglobin: 7.8 g/dL — ABNORMAL LOW (ref 12.0–15.0)
MCH: 25.6 pg — ABNORMAL LOW (ref 26.0–34.0)
MCHC: 30.8 g/dL (ref 30.0–36.0)
MCV: 83 fL (ref 80.0–100.0)
Platelets: 80 10*3/uL — ABNORMAL LOW (ref 150–400)
RBC: 3.05 MIL/uL — ABNORMAL LOW (ref 3.87–5.11)
RDW: 14.9 % (ref 11.5–15.5)
WBC: 2 10*3/uL — ABNORMAL LOW (ref 4.0–10.5)
nRBC: 0 % (ref 0.0–0.2)

## 2018-09-25 LAB — BASIC METABOLIC PANEL
Anion gap: 10 (ref 5–15)
BUN: 10 mg/dL (ref 6–20)
CO2: 22 mmol/L (ref 22–32)
Calcium: 8.2 mg/dL — ABNORMAL LOW (ref 8.9–10.3)
Chloride: 104 mmol/L (ref 98–111)
Creatinine, Ser: 0.71 mg/dL (ref 0.44–1.00)
GFR calc Af Amer: >60 mL/min (ref >60)
GFR calc non Af Amer: >60 mL/min (ref >60)
Glucose, Bld: 115 mg/dL — ABNORMAL HIGH (ref 70–99)
Potassium: 3.5 mmol/L (ref 3.5–5.1)
Sodium: 136 mmol/L (ref 135–145)

## 2018-09-25 LAB — GLUCOSE, CAPILLARY
Glucose-Capillary: 94 mg/dL (ref 70–99)
Glucose-Capillary: 150 mg/dL — ABNORMAL HIGH (ref 70–99)
Glucose-Capillary: 220 mg/dL — ABNORMAL HIGH (ref 70–99)
Glucose-Capillary: 179 mg/dL — ABNORMAL HIGH (ref 70–99)

## 2018-09-25 NOTE — Progress Notes (Signed)
PROGRESS NOTE  Holly Mueller FAO:[id number] DOB: 1965/07/11 DOA: 09/22/2018 PCP: [name], MD   LOS: 3 days   Brief Narrative / Interim history: 53 year old female history of ILD followed by pulmonology as an outpatient, recently admitted for hemoptysis and currently on prednisone, history of PVC with liver cirrhosis, chronic anemia and pancytopenia, diabetes mellitus, hypothyroidism, came to the hospital with significant fluid overload and 30 pound weight gain along with progressive shortness of breath with minimal exertion.  She was seen in the pulmonology clinic day prior to admission and was sent to the hospital.  Subjective: Appreciates his swelling is coming down.  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  She was not on diuretics prior to admission, she was given IV furosemide on admission and placed on 60 twice daily. -Currently on furosemide along with spironolactone, she is net -6 L, renal function remains stable and blood pressure is tolerable.  Continue to closely monitor.  GI following.  She dropped 30 pounds,?  Accuracy.  Active Problems Liver cirrhosis, decompensated with anasarca and ascites -Status post IR guided paracentesis on 5/15, findings consistent with ascites due to portal hypertension, cell count shows no evidence of SBP  Pulmonary fibrosis -Followed by pulmonology as an outpatient, diagnosis of idiopathic pulmonary fibrosis versus an NSIP versus ILD with autoimmune features.  On prednisone  currently.  Recently hospitalized for hemoptysis, no hemoptysis this time around. -Cough is better after Mucinex, respiratory status seems to be stable and she is on room air  Pancytopenia -Likely due to cirrhosis, she has been having worsening hemoglobin, 6.5 on admission requiring unit of packed red blood cells.  Patient describes having black stools last week.  Last upper endoscopy was in 2015 by Dr. Carlean Purl.  GI consulted again due to concern for acute on chronic GI bleed, appreciate input -GI considering repeat endoscopy prior to discharge.  Hemoglobin improved appropriately following transfusion and has remained stable.  Type 2 diabetes mellitus -Continue Lantus and sliding scale, CBGs controlled 90s-150s  Abdominal pain -Unlikely related to SBP, she has been history of chronic cough and may be related to that. -Improving  Hypothyroidism -continue Synthroid   Scheduled Meds: . dextromethorphan-guaiFENesin  1 tablet Oral BID  . furosemide  60 mg Oral BID  . insulin aspart  0-9 Units Subcutaneous TID AC & HS  . insulin glargine  25 Units Subcutaneous QHS  . levothyroxine  250 mcg Oral Q0600  . loratadine  10 mg Oral Daily  . [START ON 09/26/2018] pantoprazole  40 mg Intravenous Q12H  . predniSONE  10 mg Oral Q breakfast  . spironolactone  100 mg Oral Daily   Continuous Infusions: . cefTRIAXone (ROCEPHIN)  IV 1 g (09/24/18 2345)  . pantoprozole (PROTONIX) infusion 8 mg/hr (09/23/18 0626)   PRN Meds:.albuterol, lidocaine, ondansetron **OR** ondansetron (ZOFRAN) IV, traMADol  DVT prophylaxis: SCDs Code Status: Full code Family Communication: no family at bedside  Disposition Plan: home when ready   Consultants:   GI  Procedures:   None   Antimicrobials:  Ceftriaxone 5/14 >>  Objective: Vitals:   09/24/18 1605 09/24/18 2115 09/25/18 0530 09/25/18 0533  BP: 126/72 115/73  100/64  Pulse: 85 97  80  Resp: _0 Temp: 98.9 F (37.2 C) 98.6 F (37 C)  98.5  F (36.9 C)  TempSrc: Oral Oral  Oral  SpO2: 94% 98%  94%  Weight:   110.6 kg   Height:        Intake/Output Summary (Last 24 hours) at 09/25/2018 1326 Last data filed at 09/25/2018 1200 Gross per 24 hour  Intake 562 ml  Output 3900 ml  Net -3338 ml   Filed Weights   09/22/18 1529 09/23/18 0456 09/25/18 0530  Weight: 126.7 kg 120.8 kg 110.6 kg    Examination: Constitutional: NAD Eyes: No scleral icterus ENMT: Moist mucous membranes Neck: normal, supple Respiratory: Lungs clear to auscultation, no wheezing or crackles heard.  Moves air well.  Intermittent coughing Cardiovascular: Regular rate and rhythm, no murmurs appreciated. 1+ pitting lower extremity edema Abdomen: Soft, nontender, nondistended, positive bowel sounds Musculoskeletal: no clubbing / cyanosis.  Skin: No rashes seen Neurologic: Nonfocal, equal strength Psychiatric: Normal judgment and insight. Alert and oriented x 3. Normal mood.    Data Reviewed: I have independently reviewed following labs and imaging studies   CBC: Recent Labs  Lab 09/22/18 1420  09/22/18 2349 09/23/18 0226 09/23/18 1148 09/24/18 0223 09/25/18 0240  WBC 1.7 Repeated and verified X2.*   < > 2.1* 2.1* 1.8* 1.6* 2.0*  NEUTROABS 1.2*  --   --  1.0*  --   --   --   HGB 7.7 Repeated and verified X2.*   < > 7.1* 6.5* 7.8* 7.6* 7.8*  HCT 23.6 Repeated and verified X2.*   < > 23.3* 21.3* 25.4* 24.2* 25.3*  MCV 81.4   < > 83.8 83.9 83.3 82.9 83.0  PLT 67.0 Repeated and verified X2.*   < > 74* 68* 75* 78* 80*   < > = values in this interval not displayed.   Basic Metabolic Panel: Recent Labs  Lab 09/22/18 1420 09/22/18 1531 09/24/18 0223 09/25/18 0240  NA 140 140 140 136  K 3.9 4.0 3.2* 3.5  CL 110 111 107 104  CO2 22 19* 24 22  GLUCOSE 227* 243* 113* 115*  BUN _1 CREATININE 0.63 0.74 0.73 0.71  CALCIUM 8.2* 8.6* 8.1* 8.2*   GFR: Estimated Creatinine Clearance: 102.5 mL/min (by C-G formula based on SCr of 0.71  mg/dL). Liver Function Tests: Recent Labs  Lab 09/22/18 1420 09/23/18 0226 09/24/18 0223  AST 36 32 35  ALT 38* 37 35  ALKPHOS 165* 117 90  BILITOT 1.6* 1.3* 1.7*  PROT 6.3 5.8* 6.0*  ALBUMIN 3.0* 2.5* 2.6*   No results for input(s): LIPASE, AMYLASE in the last 168 hours. No results for input(s): AMMONIA in the last 168 hours. Coagulation Profile: Recent Labs  Lab 09/24/18 0223  INR 1.3*   Cardiac Enzymes: Recent Labs  Lab 09/22/18 2349  TROPONINI <0.03   BNP (last 3 results) Recent Labs    09/22/18 1420  PROBNP 35.0   HbA1C: No results for input(s): HGBA1C in the last 72 hours. CBG: Recent Labs  Lab 09/24/18 1154 09/24/18 1640 09/24/18 2129 09/25/18 0617 09/25/18 1131  GLUCAP 150* 182* 229* 94 150*   Lipid Profile: No results for input(s): CHOL, HDL, LDLCALC, TRIG, CHOLHDL, LDLDIRECT in the last 72 hours. Thyroid Function Tests: Recent Labs    09/23/18 0226  TSH 0.181*  Anemia Panel: No results for input(s): VITAMINB12, FOLATE, FERRITIN, TIBC, IRON, RETICCTPCT in the last 72 hours. Urine analysis:    Component Value Date/Time   COLORURINE YELLOW 12/28/2017 1950   APPEARANCEUR CLEAR 12/28/2017 1950   LABSPEC 1.012 12/28/2017 1950   PHURINE 7.0 12/28/2017 1950   GLUCOSEU NEGATIVE 12/28/2017 1950   HGBUR SMALL (A) 12/28/2017 1950   BILIRUBINUR NEGATIVE 12/28/2017 1950   KETONESUR NEGATIVE 12/28/2017 1950   PROTEINUR NEGATIVE 12/28/2017 1950   UROBILINOGEN 2.0 (H) 11/22/2013 1743   NITRITE NEGATIVE 12/28/2017 1950   LEUKOCYTESUR NEGATIVE 12/28/2017 1950   Sepsis Labs: Invalid input(s): PROCALCITONIN, LACTICIDVEN  Recent Results (from the past 240 hour(s))  SARS Coronavirus 2 (CEPHEID - Performed in [place] hospital lab), Hosp Order     Status: None   Collection Time: 09/22/18  8:15 PM  Result Value Ref Range Status   SARS Coronavirus 2 NEGATIVE NEGATIVE Final    Comment: (NOTE) If result is NEGATIVE SARS-CoV-2 target nucleic acids  are NOT DETECTED. The SARS-CoV-2 RNA is generally detectable in upper and lower  respiratory specimens during the acute phase of infection. The lowest  concentration of SARS-CoV-2 viral copies this assay can detect is 250  copies / mL. A negative result does not preclude SARS-CoV-2 infection  and should not be used as the sole basis for treatment or other  patient management decisions.  A negative result may occur with  improper specimen collection / handling, submission of specimen other  than nasopharyngeal swab, presence of viral mutation(s) within the  areas targeted by this assay, and inadequate number of viral copies  (<250 copies / mL). A negative result must be combined with clinical  observations, patient history, and epidemiological information. If result is POSITIVE SARS-CoV-2 target nucleic acids are DETECTED. The SARS-CoV-2 RNA is generally detectable in upper and lower  respiratory specimens dur ing the acute phase of infection.  Positive  results are indicative of active infection with SARS-CoV-2.  Clinical  correlation with patient history and other diagnostic information is  necessary to determine patient infection status.  Positive results do  not rule out bacterial infection or co-infection with other viruses. If result is PRESUMPTIVE POSTIVE SARS-CoV-2 nucleic acids MAY BE PRESENT.   A presumptive positive result was obtained on the submitted specimen  and confirmed on repeat testing.  While 2019 novel coronavirus  (SARS-CoV-2) nucleic acids may be present in the submitted sample  additional confirmatory testing may be necessary for epidemiological  and / or clinical management purposes  to differentiate between  SARS-CoV-2 and other Sarbecovirus currently known to infect humans.  If clinically indicated additional testing with an alternate test  methodology [phone]) is advised. The SARS-CoV-2 RNA is generally  detectable in upper and lower respiratory sp ecimens  during the acute  phase of infection. The expected result is Negative. Fact Sheet for Patients:  StrictlyIdeas.no Fact Sheet for Healthcare Providers: BankingDealers.co.za This test is not yet approved or cleared by the [place] FDA and has been authorized for detection and/or diagnosis of SARS-CoV-2 by FDA under an Emergency Use Authorization (EUA).  This EUA will remain in effect (meaning this test can be used) for the duration of the COVID-19 declaration under Section 564(b)(1) of the Act, 21 U.S.C. section 360bbb-3(b)(1), unless the authorization is terminated or revoked sooner. Performed at [facility], [place] [address] [place], [place] [address]   Gram stain     Status: None   Collection Time: 09/23/18 10:38 AM  Result Value Ref Range Status   Specimen Description PERITONEAL FLUID  Final   Special Requests NONE  Final   Gram Stain   Final    RARE WBC PRESENT, PREDOMINANTLY MONONUCLEAR NO ORGANISMS SEEN Performed at [facility], [address], [place], [place] [address]    Report Status 09/23/2018 FINAL  Final  Culture, body fluid-bottle     Status: None (Preliminary result)   Collection Time: 09/23/18 10:38 AM  Result Value Ref Range Status   Specimen Description PERITONEAL fluid for immunophenotyping by flow  Final   Special Requests NONE  Final   Culture   Final    NO GROWTH < 24 HOURS Performed at [facility], [place] [address] [place], [place] [address]    Report Status PENDING  Incomplete      Radiology Studies: US Liver Doppler  Result Date: 09/24/2018 CLINICAL DATA:  Hepatic cirrhosis. EXAM: DUPLEX ULTRASOUND OF LIVER TECHNIQUE: Color and duplex Doppler ultrasound was performed to evaluate the hepatic in-flow and out-flow vessels. COMPARISON:  Abdominal CT 09/23/2018 FINDINGS: Liver: Nodular contour and compatible with cirrhosis. No discrete liver lesion. Liver parenchyma is heterogeneous.  Main Portal Vein size: 1.7 cm Portal Vein Velocities Main Prox:  28 cm/sec Main Mid: 39 cm/sec Main Dist:  35 cm/sec Right: 32 cm/sec Left: 26 cm/sec Hepatic Vein Velocities Right:  50 cm/sec Middle:  49 cm/sec Left:  49 cm/sec IVC: Present and patent with normal respiratory phasicity. Hepatic Artery Velocity:  72 cm/sec Splenic Vein Velocity:  47 cm/sec Spleen: 19.3 x 20.9 x 8.6 cm with a total volume of 1819 cm^3 (411 cm^3 is upper limit normal) Portal Vein Occlusion/Thrombus: No Splenic Vein Occlusion/Thrombus: No Ascites: Present Varices: None Normal hepatopetal flow in the portal veins. Normal hepatofugal flow in the hepatic veins. IMPRESSION: 1. Hepatic cirrhosis with evidence for portal hypertension based on the splenomegaly and ascites. 2. Patent portal venous system with normal direction of flow. Electronically Signed   By: Markus Daft M.D.   On: 09/24/2018 16:00    Marzetta Board, MD, PhD [facility]  Contact via  [email or web address]  [place] P: [phone]  F: [phone]

## 2018-09-25 NOTE — H&P (View-Only) (Signed)
Patient will have EGD tomorrow to do variceal screen. NPO at midnight. Obtain CMP & INR & CBC tomorrow AM  The risks and benefits of endoscopic evaluation were discussed with the patient; these include but are not limited to the risk of perforation, infection, bleeding, missed lesions, lack of diagnosis, severe illness requiring hospitalization, as well as anesthesia and sedation related illnesses.  The patient is agreeable to proceed.   Patient in agreement.  Jearldean Gutt Mansouraty, MD [facility]oscopy Office # [phone] 

## 2018-09-25 NOTE — Progress Notes (Signed)
Patient will have EGD tomorrow to do variceal screen. NPO at midnight. Obtain CMP & INR & CBC tomorrow AM  The risks and benefits of endoscopic evaluation were discussed with the patient; these include but are not limited to the risk of perforation, infection, bleeding, missed lesions, lack of diagnosis, severe illness requiring hospitalization, as well as anesthesia and sedation related illnesses.  The patient is agreeable to proceed.   Patient in agreement.  Justice Britain, MD [facility] Office # [phone]

## 2018-09-26 ENCOUNTER — Encounter (HOSPITAL_COMMUNITY): Payer: Self-pay | Admitting: *Deleted

## 2018-09-26 ENCOUNTER — Inpatient Hospital Stay (HOSPITAL_COMMUNITY): Payer: Medicaid Other | Admitting: Anesthesiology

## 2018-09-26 ENCOUNTER — Encounter (HOSPITAL_COMMUNITY)
Admission: EM | Disposition: A | Discharge status: Other Acute Inpatient Hospital | Payer: Self-pay | Source: Home / Self Care | Attending: Internal Medicine

## 2018-09-26 DIAGNOSIS — I864 Gastric varices: Secondary | ICD-10-CM

## 2018-09-26 DIAGNOSIS — K766 Portal hypertension: Secondary | ICD-10-CM

## 2018-09-26 DIAGNOSIS — K3189 Other diseases of stomach and duodenum: Secondary | ICD-10-CM

## 2018-09-26 DIAGNOSIS — R0609 Other forms of dyspnea: Secondary | ICD-10-CM

## 2018-09-26 HISTORY — PX: BIOPSY: SHX5522

## 2018-09-26 HISTORY — PX: ESOPHAGOGASTRODUODENOSCOPY (EGD) WITH PROPOFOL: SHX5813

## 2018-09-26 LAB — BASIC METABOLIC PANEL
Anion gap: 7 (ref 5–15)
BUN: 19 mg/dL (ref 6–20)
CO2: 24 mmol/L (ref 22–32)
Calcium: 8 mg/dL — ABNORMAL LOW (ref 8.9–10.3)
Chloride: 104 mmol/L (ref 98–111)
Creatinine, Ser: 0.68 mg/dL (ref 0.44–1.00)
GFR calc Af Amer: >60 mL/min (ref >60)
GFR calc non Af Amer: >60 mL/min (ref >60)
Glucose, Bld: 154 mg/dL — ABNORMAL HIGH (ref 70–99)
Potassium: 3.6 mmol/L (ref 3.5–5.1)
Sodium: 135 mmol/L (ref 135–145)

## 2018-09-26 LAB — CBC
HCT: 24.6 % — ABNORMAL LOW (ref 36.0–46.0)
Hemoglobin: 7.6 g/dL — ABNORMAL LOW (ref 12.0–15.0)
MCH: 25.4 pg — ABNORMAL LOW (ref 26.0–34.0)
MCHC: 30.9 g/dL (ref 30.0–36.0)
MCV: 82.3 fL (ref 80.0–100.0)
Platelets: 119 10*3/uL — ABNORMAL LOW (ref 150–400)
RBC: 2.99 MIL/uL — ABNORMAL LOW (ref 3.87–5.11)
RDW: 15.6 % — ABNORMAL HIGH (ref 11.5–15.5)
WBC: 3.6 10*3/uL — ABNORMAL LOW (ref 4.0–10.5)
nRBC: 0 % (ref 0.0–0.2)

## 2018-09-26 LAB — GLUCOSE, CAPILLARY
Glucose-Capillary: 166 mg/dL — ABNORMAL HIGH (ref 70–99)
Glucose-Capillary: 121 mg/dL — ABNORMAL HIGH (ref 70–99)
Glucose-Capillary: 179 mg/dL — ABNORMAL HIGH (ref 70–99)
Glucose-Capillary: 200 mg/dL — ABNORMAL HIGH (ref 70–99)

## 2018-09-26 SURGERY — ESOPHAGOGASTRODUODENOSCOPY (EGD) WITH PROPOFOL
Anesthesia: Monitor Anesthesia Care

## 2018-09-26 MED ORDER — LACTATED RINGERS IV SOLN
INTRAVENOUS | Status: DC
  Administered 2018-09-26: 10:00:00 via INTRAVENOUS

## 2018-09-26 MED ORDER — SPIRONOLACTONE 100 MG PO TABS: 100.0000 mg | ORAL_TABLET | Freq: Every day | ORAL | Status: DC

## 2018-09-26 MED ORDER — LIDOCAINE 2% (20 MG/ML) 5 ML SYRINGE
INTRAMUSCULAR | Status: DC | PRN
  Administered 2018-09-26: 40 mg via INTRAVENOUS

## 2018-09-26 MED ORDER — NADOLOL 40 MG PO TABS
40.0000 mg | ORAL_TABLET | Freq: Every day | ORAL | Status: DC
  Administered 2018-09-26: 40 mg via ORAL
  Filled 2018-09-26: qty 1

## 2018-09-26 MED ORDER — NADOLOL 20 MG PO TABS: 20.0000 mg | ORAL_TABLET | Freq: Every day | ORAL | Status: DC

## 2018-09-26 MED ORDER — SODIUM CHLORIDE 0.9 % IV SOLN: 1.0000 g | INTRAVENOUS | Status: DC

## 2018-09-26 MED ORDER — PANTOPRAZOLE SODIUM 40 MG PO TBEC: 40.0000 mg | DELAYED_RELEASE_TABLET | Freq: Every day | ORAL | Status: DC

## 2018-09-26 MED ORDER — PROPOFOL 10 MG/ML IV BOLUS
INTRAVENOUS | Status: DC | PRN
  Administered 2018-09-26: 40 mg via INTRAVENOUS
  Administered 2018-09-26: 20 mg via INTRAVENOUS
  Administered 2018-09-26: 40 mg via INTRAVENOUS
  Administered 2018-09-26: 30 mg via INTRAVENOUS
  Administered 2018-09-26: 20 mg via INTRAVENOUS

## 2018-09-26 MED ORDER — DM-GUAIFENESIN ER 30-600 MG PO TB12: 1.0000 | ORAL_TABLET | Freq: Two times a day (BID) | ORAL | Status: DC

## 2018-09-26 MED ORDER — FUROSEMIDE 20 MG PO TABS: 60.0000 mg | ORAL_TABLET | Freq: Two times a day (BID) | ORAL | Status: DC

## 2018-09-26 MED ORDER — TRAMADOL HCL 50 MG PO TABS: 25.0000 mg | ORAL_TABLET | Freq: Four times a day (QID) | ORAL | Status: DC | PRN

## 2018-09-26 MED ORDER — NADOLOL 40 MG PO TABS: 40.0000 mg | ORAL_TABLET | Freq: Every day | ORAL | Status: DC

## 2018-09-26 MED ORDER — PHENYLEPHRINE 40 MCG/ML (10ML) SYRINGE FOR IV PUSH (FOR BLOOD PRESSURE SUPPORT)
PREFILLED_SYRINGE | INTRAVENOUS | Status: DC | PRN
  Administered 2018-09-26 (×4): 80 ug via INTRAVENOUS

## 2018-09-26 SURGICAL SUPPLY — 15 items

## 2018-09-26 NOTE — Transfer of Care (Signed)
Immediate Anesthesia Transfer of Care Note  Patient: Holly Mueller  Procedure(s) Performed: ESOPHAGOGASTRODUODENOSCOPY (EGD) WITH PROPOFOL (N/A ) BIOPSY  Patient Location: Endoscopy Unit  Anesthesia Type:MAC  Level of Consciousness: awake, alert  and patient cooperative  Airway & Oxygen Therapy: Patient Spontanous Breathing and Patient connected to nasal cannula oxygen  Post-op Assessment: Report given to RN and Post -op Vital signs reviewed and stable  Post vital signs: Reviewed and stable  Last Vitals:  Vitals Value Taken Time  BP    Temp    Pulse    Resp    SpO2      Last Pain:  Vitals:   09/26/18 1019  TempSrc: Oral  PainSc: 0-No pain         Complications: No apparent anesthesia complications

## 2018-09-26 NOTE — Progress Notes (Signed)
Patient transfer to [place] via care link. Patient alert and oriented X4, V/s stable, patient denied pain or discomfort. Gave report to receiving nurse at [facility].

## 2018-09-26 NOTE — Op Note (Addendum)
[facility] Patient Name: Holly Mueller Procedure Date : 09/26/2018 MRN: [id number] Attending MD: Thornton Park MD, MD Date of Birth: 1966/04/23 CSN: [id number] Age: 53 Admit Type: Inpatient Procedure:                Upper GI endoscopy Indications:              Portal hypertension with suspected esophageal                            varices (known PBC cirrhosis with MELD of 11) ,                            Progressive anemia (Hemoglobin 12.2 in March, 7.6                            today; BUN 8 in March, 19 today). Patient reports                            isolated episode of hematemesis yesterday. Last EGD                            in 2015 showed no varices. Liver doppler ultrasound                            yesterday showed cirrhosis, ascites, and no portal                            vein or splenic vein thrombus. Providers:                Thornton Park MD, MD, Grace Isaac, RN, Marguerita Merles, Technician, Charolette Child, Technician Referring MD:              Medicines:                See the Anesthesia note for documentation of the                            administered medications Complications:            No immediate complications. Estimated blood loss:                            Minimal. Estimated Blood Loss:     Estimated blood loss was minimal. Procedure:                Pre-Anesthesia Assessment:                           - Prior to the procedure, a History and Physical                            was performed, and patient medications and  allergies were reviewed. The patient's tolerance of                            previous anesthesia was also reviewed. The risks                            and benefits of the procedure and the sedation                            options and risks were discussed with the patient.                            All questions were answered, and informed consent                    was obtained. Prior Anticoagulants: The patient has                            taken no previous anticoagulant or antiplatelet                            agents. ASA Grade Assessment: III - A patient with                            severe systemic disease. After reviewing the risks                            and benefits, the patient was deemed in                            satisfactory condition to undergo the procedure.                           After obtaining informed consent, the endoscope was                            passed under direct vision. Throughout the                            procedure, the patient's blood pressure, pulse, and                            oxygen saturations were monitored continuously. The                            [id number]-H190 ([id number]) Olympus gastroscope was                            introduced through the mouth, and advanced to the                            third part of duodenum. The upper GI endoscopy was  accomplished without difficulty. The patient                            tolerated the procedure well. Scope In: Scope Out: Findings:      The examined esophagus was normal. No esopahgeal varices present.      Type 1 isolated gastric varices (IGV1, varices located in the fundus)       with no bleeding were found in the cardia. The varix has an ulcerated       tip. They were large in largest diameter. There is mild, diffuse portal       hypertensive gastropathy.      Multiple, small non-bleeding erosions were found in the gastric body and       at the pylorus. There were no stigmata of recent bleeding. Biopsies were       taken with a cold forceps for histology. No blood was present in the       stomach.      The examined duodenum was normal. No varices present. No blood present.      The exam was otherwise without abnormality. Impression:               - Normal esophagus. No esophageal varices seen.                            - Type 1 isolated gastric varices (IGV1, varices                            located in the fundus), with an area of ulceration                            suggesting stuttering bleeding.                           - Non-bleeding erosive gastropathy. Biopsied.                           - Normal examined duodenum. No duodenal varices                            seen.                           - The examination was otherwise normal. Recommendation:           - Clear liquid diet today. No red liquids, please.                           - Continue present medications. Add pantoprazole 40                            mg daily.                           - Continue serial hgb/hct with transfusion as                            indicated.                           -  Start Nadolol 40 mg daily.                           - I would have a low threshold to start octreotide                            as a continuous infusion.                           - Avoid all NSAIDs. Await pathology results given                            the erosions.                           - Given the high risk for bleeding from the gastric                            varices, I recommend endoscopic treatment prior to                            considering discharge. We do not have                            cyanoacrylate/glue within the [facility].                            Therefore, will need to consider inpatient transfer                            to a tertiary care center. Will reach out to [facility]                            to see if they could offer treatment. TIPS would be                            needed with urgent, acute bleeding that develops                            prior to a possible transfer. Procedure Code(s):        --- Professional ---                           [phone], Esophagogastroduodenoscopy, flexible,                            transoral; with biopsy, single or multiple Diagnosis Code(s):         --- Professional ---                           I86.4, Gastric varices                           K31.89, Other diseases of stomach and duodenum  K76.6, Portal hypertension CPT copyright 2019 American Medical Association. All rights reserved. The codes documented in this report are preliminary and upon coder review may  be revised to meet current compliance requirements. Thornton Park MD, MD 09/26/2018 12:14:00 PM This report has been signed electronically. Number of Addenda: 0

## 2018-09-26 NOTE — Interval H&P Note (Signed)
History and Physical Interval Note:  09/26/2018 10:50 AM  Holly Mueller  has presented today for surgery, with the diagnosis of cirrhosis, anemia, assess for EV.  The various methods of treatment have been discussed with the patient and family. After consideration of risks, benefits and other options for treatment, the patient has consented to  Procedure(s): ESOPHAGOGASTRODUODENOSCOPY (EGD) WITH PROPOFOL (N/A) as a surgical intervention.  The patient's history has been reviewed, patient examined, no change in status, stable for surgery.  I have reviewed the patient's chart and labs.  Questions were answered to the patient's satisfaction.     Thornton Park

## 2018-09-26 NOTE — Progress Notes (Deleted)
PROGRESS NOTE  Holly Mueller EVO:[id number] DOB: Nov 30, 1965 DOA: 09/22/2018 PCP: [name], MD   LOS: 4 days   Brief Narrative / Interim history: 53 year old female history of ILD followed by pulmonology as an outpatient, recently admitted for hemoptysis and currently on prednisone, history of PVC with liver cirrhosis, chronic anemia and pancytopenia, diabetes mellitus, hypothyroidism, came to the hospital with significant fluid overload and 30 pound weight gain along with progressive shortness of breath with minimal exertion.  She was seen in the pulmonology clinic day prior to admission and was sent to the hospital.  Subjective: She is doing well this morning, was able to ambulate yesterday and felt stronger and breathing has improved.  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  She was not on diuretics prior to admission, she was given IV furosemide on admission and placed on 60 twice daily. -Currently on furosemide along with spironolactone, she is net -6 L, renal function remains stable and blood pressure is tolerable.  Continue to closely monitor.  GI following.   -Volume status improved significantly  Active Problems Liver cirrhosis, decompensated with anasarca and ascites, GI bleed -Status post IR guided paracentesis on 5/15, findings consistent with ascites due to portal hypertension, cell count shows no evidence of SBP -Gastroenterology was consulted and patient underwent an endoscopy on 5/18.  There were no  esophageal varices, however there was evidence of type I gastric varices located in the fundus with an area of ulceration suggesting stuttering bleeding.  Patient is to continue on clear liquid diet, she was started on nadolol and given high risk of bleeding GI will reach out to [facility] to see if they will accept her in transfer as it felt that this versus will need inpatient endoscopic treatment which is not available at [facility]  Pulmonary fibrosis -Followed by pulmonology as an outpatient, diagnosis of idiopathic pulmonary fibrosis versus an NSIP versus ILD with autoimmune features.  On prednisone currently.  Recently hospitalized for hemoptysis, no hemoptysis this time around. -Cough is better after Mucinex, respiratory status seems to be stable and she is on room air  Pancytopenia -Likely due to cirrhosis, she has been having worsening hemoglobin, 6.5 on admission requiring unit of packed red blood cells.  Patient describes having black stools last week.  Last upper endoscopy was in 2015 by Dr. Carlean Purl.  -Probably had a component of GI bleed given findings from the endoscopy as above  Type 2 diabetes mellitus -Continue Lantus and sliding scale, CBGs controlled today 120s-170s, continue current regimen  Abdominal pain -Unlikely related to SBP, she has been history of chronic cough and may be related to that. -Improving  Hypothyroidism -continue Synthroid   Scheduled Meds: . dextromethorphan-guaiFENesin  1 tablet Oral BID  . furosemide  60 mg Oral BID  . insulin aspart  0-9 Units Subcutaneous TID AC & HS  . insulin glargine  25 Units Subcutaneous QHS  . levothyroxine  250 mcg Oral Q0600  . loratadine  10 mg Oral Daily  . nadolol  40 mg Oral Daily  . [START  ON 09/27/2018] pantoprazole  40 mg Oral Daily  . predniSONE  10 mg Oral Q breakfast  . spironolactone  100 mg Oral Daily   Continuous Infusions: . cefTRIAXone (ROCEPHIN)  IV 1 g (09/25/18 2359)   PRN Meds:.albuterol, lidocaine,  ondansetron **OR** ondansetron (ZOFRAN) IV, traMADol  DVT prophylaxis: SCDs Code Status: Full code Family Communication: no family at bedside  Disposition Plan: Possible transfer to [facility]  Consultants:   GI  Procedures:   EGD 5/18 The exam was otherwise without abnormality. Impression:               - Normal esophagus. No esophageal varices seen.                           - Type 1 isolated gastric varices (IGV1, varices                            located in the fundus), with an area of ulceration                            suggesting stuttering bleeding.                           - Non-bleeding erosive gastropathy. Biopsied.                           - Normal examined duodenum. No duodenal varices                            seen.                           - The examination was otherwise normal. Recommendation:           - Clear liquid diet today. No red liquids, please.                           - Continue present medications. Add pantoprazole 40                            mg daily.                           - Continue serial hgb/hct with transfusion as                            indicated.                           - Start Nadolol 40 mg daily.                           - I would have a low threshold to start octreotide                            as a continuous infusion.                           -  Avoid all NSAIDs. Await pathology results given                            the erosions.                           - Given the high risk for bleeding from the gastric                            varices, I recommend endoscopic treatment prior to                            considering discharge. We do not have                            cyanoacrylate/glue within the [facility].                            Therefore, will need to consider inpatient transfer                            to a tertiary care center. Will reach out to [facility]                            to see if they could offer  treatment. TIPS would be                            needed with urgent, acute bleeding that develops                            prior to a possible transfer.   Antimicrobials:  Ceftriaxone 5/14 >>   Objective: Vitals:   09/26/18 1019 09/26/18 1107 09/26/18 1116 09/26/18 1128  BP: 120/75 (!) 98/44 (!) 95/53 111/69  Pulse: (!) 103 88 96 97  Resp: _0 (!) 21  Temp: 98.5 F (36.9 C) 98.4 F (36.9 C)    TempSrc: Oral Oral    SpO2: 92% 97% 94% 93%  Weight:      Height:        Intake/Output Summary (Last 24 hours) at 09/26/2018 1349 Last data filed at 09/26/2018 1059 Gross per 24 hour  Intake 1662.37 ml  Output 3600 ml  Net -1937.63 ml   Filed Weights   09/23/18 0456 09/25/18 0530 09/26/18 0601  Weight: 120.8 kg 110.6 kg 109.9 kg    Examination: Constitutional: NAD Eyes: No scleral icterus ENMT: Moist mucous membranes Neck: normal, supple Respiratory: Lungs are clear to auscultation, no wheezing or crackles are heard. Cardiovascular: Regular rate and rhythm, no murmurs.  Trace lower extremity edema Abdomen: Obese, soft, nontender, nondistended, positive bowel sounds Musculoskeletal: no clubbing / cyanosis.  Skin: No new rashes Neurologic: No focal deficits, equal strength, ambulatory Psychiatric: Normal judgment and insight. Alert and oriented x 3. Normal mood.    Data Reviewed: I have independently reviewed following labs and imaging studies   CBC: Recent Labs  Lab 09/22/18 1420  09/23/18 0226 09/23/18 1148 09/24/18 0223 09/25/18 0240 09/26/18 0416  WBC 1.7 Repeated and verified X2.*   < >  2.1* 1.8* 1.6* 2.0* 3.6*  NEUTROABS 1.2*  --  1.0*  --   --   --   --   HGB 7.7 Repeated and verified X2.*   < > 6.5* 7.8* 7.6* 7.8* 7.6*  HCT 23.6 Repeated and verified X2.*   < > 21.3* 25.4* 24.2* 25.3* 24.6*  MCV 81.4   < > 83.9 83.3 82.9 83.0 82.3  PLT 67.0 Repeated and verified X2.*   < > 68* 75* 78* 80* 119*   < > = values in this interval not displayed.    Basic Metabolic Panel: Recent Labs  Lab 09/22/18 1420 09/22/18 1531 09/24/18 0223 09/25/18 0240 09/26/18 0416  NA 140 140 140 136 135  K 3.9 4.0 3.2* 3.5 3.6  CL 110 111 107 104 104  CO2 22 19* _0 GLUCOSE 227* 243* 113* 115* 154*  BUN _1 CREATININE 0.63 0.74 0.73 0.71 0.68  CALCIUM 8.2* 8.6* 8.1* 8.2* 8.0*   GFR: Estimated Creatinine Clearance: 102.1 mL/min (by C-G formula based on SCr of 0.68 mg/dL). Liver Function Tests: Recent Labs  Lab 09/22/18 1420 09/23/18 0226 09/24/18 0223  AST 36 32 35  ALT 38* 37 35  ALKPHOS 165* 117 90  BILITOT 1.6* 1.3* 1.7*  PROT 6.3 5.8* 6.0*  ALBUMIN 3.0* 2.5* 2.6*   No results for input(s): LIPASE, AMYLASE in the last 168 hours. No results for input(s): AMMONIA in the last 168 hours. Coagulation Profile: Recent Labs  Lab 09/24/18 0223  INR 1.3*   Cardiac Enzymes: Recent Labs  Lab 09/22/18 2349  TROPONINI <0.03   BNP (last 3 results) Recent Labs    09/22/18 1420  PROBNP 35.0   HbA1C: No results for input(s): HGBA1C in the last 72 hours. CBG: Recent Labs  Lab 09/25/18 1642 09/25/18 2131 09/26/18 0555 09/26/18 0801 09/26/18 1148  GLUCAP 220* 179* 166* 121* 179*   Lipid Profile: No results for input(s): CHOL, HDL, LDLCALC, TRIG, CHOLHDL, LDLDIRECT in the last 72 hours. Thyroid Function Tests: No results for input(s): TSH, T4TOTAL, FREET4, T3FREE, THYROIDAB in the last 72 hours. Anemia Panel: No results for input(s): VITAMINB12, FOLATE, FERRITIN, TIBC, IRON, RETICCTPCT in the last 72 hours. Urine analysis:    Component Value Date/Time   COLORURINE YELLOW 12/28/2017 1950   APPEARANCEUR CLEAR 12/28/2017 1950   LABSPEC 1.012 12/28/2017 1950   PHURINE 7.0 12/28/2017 1950   GLUCOSEU NEGATIVE 12/28/2017 1950   HGBUR SMALL (A) 12/28/2017 1950   BILIRUBINUR NEGATIVE 12/28/2017 1950   KETONESUR NEGATIVE 12/28/2017 1950   PROTEINUR NEGATIVE 12/28/2017 1950   UROBILINOGEN 2.0 (H) 11/22/2013 1743    NITRITE NEGATIVE 12/28/2017 1950   LEUKOCYTESUR NEGATIVE 12/28/2017 1950   Sepsis Labs: Invalid input(s): PROCALCITONIN, LACTICIDVEN  Recent Results (from the past 240 hour(s))  SARS Coronavirus 2 (CEPHEID - Performed in [place] hospital lab), Hosp Order     Status: None   Collection Time: 09/22/18  8:15 PM  Result Value Ref Range Status   SARS Coronavirus 2 NEGATIVE NEGATIVE Final    Comment: (NOTE) If result is NEGATIVE SARS-CoV-2 target nucleic acids are NOT DETECTED. The SARS-CoV-2 RNA is generally detectable in upper and lower  respiratory specimens during the acute phase of infection. The lowest  concentration of SARS-CoV-2 viral copies this assay can detect is 250  copies / mL. A negative result does not preclude SARS-CoV-2 infection  and should not be used as the sole basis for treatment or other  patient  management decisions.  A negative result may occur with  improper specimen collection / handling, submission of specimen other  than nasopharyngeal swab, presence of viral mutation(s) within the  areas targeted by this assay, and inadequate number of viral copies  (<250 copies / mL). A negative result must be combined with clinical  observations, patient history, and epidemiological information. If result is POSITIVE SARS-CoV-2 target nucleic acids are DETECTED. The SARS-CoV-2 RNA is generally detectable in upper and lower  respiratory specimens dur ing the acute phase of infection.  Positive  results are indicative of active infection with SARS-CoV-2.  Clinical  correlation with patient history and other diagnostic information is  necessary to determine patient infection status.  Positive results do  not rule out bacterial infection or co-infection with other viruses. If result is PRESUMPTIVE POSTIVE SARS-CoV-2 nucleic acids MAY BE PRESENT.   A presumptive positive result was obtained on the submitted specimen  and confirmed on repeat testing.  While 2019 novel  coronavirus  (SARS-CoV-2) nucleic acids may be present in the submitted sample  additional confirmatory testing may be necessary for epidemiological  and / or clinical management purposes  to differentiate between  SARS-CoV-2 and other Sarbecovirus currently known to infect humans.  If clinically indicated additional testing with an alternate test  methodology [id number]) is advised. The SARS-CoV-2 RNA is generally  detectable in upper and lower respiratory sp ecimens during the acute  phase of infection. The expected result is Negative. Fact Sheet for Patients:  StrictlyIdeas.no Fact Sheet for Healthcare Providers: BankingDealers.co.za This test is not yet approved or cleared by the [place] FDA and has been authorized for detection and/or diagnosis of SARS-CoV-2 by FDA under an Emergency Use Authorization (EUA).  This EUA will remain in effect (meaning this test can be used) for the duration of the COVID-19 declaration under Section 564(b)(1) of the Act, 21 U.S.C. section 360bbb-3(b)(1), unless the authorization is terminated or revoked sooner. Performed at [facility], [place] [address], [place], [place] [address]   Gram stain     Status: None   Collection Time: 09/23/18 10:38 AM  Result Value Ref Range Status   Specimen Description PERITONEAL FLUID  Final   Special Requests NONE  Final   Gram Stain   Final    RARE WBC PRESENT, PREDOMINANTLY MONONUCLEAR NO ORGANISMS SEEN Performed at [facility], [address] [place], [place] [address]    Report Status 09/23/2018 FINAL  Final  Culture, body fluid-bottle     Status: None (Preliminary result)   Collection Time: 09/23/18 10:38 AM  Result Value Ref Range Status   Specimen Description PERITONEAL fluid for immunophenotyping by flow  Final   Special Requests NONE  Final   Culture   Final    NO GROWTH 3 DAYS Performed at [facility], [place] [address],  [place],  [id number]    Report Status PENDING  Incomplete      Radiology Studies: No results found.  Marzetta Board, MD, PhD [facility]  Contact via  [email or web address]  [place] P: [phone]  F: [phone]

## 2018-09-26 NOTE — Anesthesia Postprocedure Evaluation (Signed)
Anesthesia Post Note  Patient: Holly Mueller  Procedure(s) Performed: ESOPHAGOGASTRODUODENOSCOPY (EGD) WITH PROPOFOL (N/A ) BIOPSY     Patient location during evaluation: PACU Anesthesia Type: MAC Level of consciousness: awake and alert Pain management: pain level controlled Vital Signs Assessment: post-procedure vital signs reviewed and stable Respiratory status: spontaneous breathing, nonlabored ventilation and respiratory function stable Cardiovascular status: stable and blood pressure returned to baseline Anesthetic complications: no    Last Vitals:  Vitals:   09/26/18 1116 09/26/18 1128  BP: (!) 95/53 111/69  Pulse: 96 97  Resp: 18 (!) 21  Temp:    SpO2: 94% 93%    Last Pain:  Vitals:   09/26/18 1128  TempSrc:   PainSc: 0-No pain                 Audry Pili

## 2018-09-26 NOTE — Anesthesia Procedure Notes (Signed)
Procedure Name: MAC Date/Time: 09/26/2018 1:05 AM Performed by: Renato Shin, CRNA Pre-anesthesia Checklist: Patient identified, Emergency Drugs available, Suction available and Patient being monitored Patient Re-evaluated:Patient Re-evaluated prior to induction Oxygen Delivery Method: Nasal cannula Preoxygenation: Pre-oxygenation with 100% oxygen Induction Type: IV induction Placement Confirmation: positive ETCO2 and breath sounds checked- equal and bilateral Dental Injury: Teeth and Oropharynx as per pre-operative assessment

## 2018-09-26 NOTE — Anesthesia Preprocedure Evaluation (Addendum)
Anesthesia Evaluation  Patient identified by MRN, date of birth, ID band Patient awake    Reviewed: Allergy & Precautions, NPO status , Patient's Chart, lab work & pertinent test results  History of Anesthesia Complications Negative for: history of anesthetic complications  Airway Mallampati: II  TM Distance: >3 FB Neck ROM: Full    Dental  (+) Edentulous Upper, Edentulous Lower   Pulmonary former smoker,    breath sounds clear to auscultation       Cardiovascular negative cardio ROS   Rhythm:Regular Rate:Normal     Neuro/Psych  Headaches, Seizures -, Well Controlled,  PSYCHIATRIC DISORDERS Depression    GI/Hepatic GERD  Controlled,(+) Cirrhosis       ,  Primary biliary cholangitis    Endo/Other  diabetes, Type 2, Insulin Dependent, Oral Hypoglycemic AgentsHypothyroidism  Obesity   Renal/GU negative Renal ROS     Musculoskeletal  (+) Arthritis ,   Abdominal   Peds  Hematology  (+) anemia ,  Thrombocytopenia    Anesthesia Other Findings   Reproductive/Obstetrics                            Anesthesia Physical Anesthesia Plan  ASA: III  Anesthesia Plan: MAC   Post-op Pain Management:    Induction: Intravenous  PONV Risk Score and Plan: 2 and Propofol infusion and Treatment may vary due to age or medical condition  Airway Management Planned: Nasal Cannula and Natural Airway  Additional Equipment: None  Intra-op Plan:   Post-operative Plan:   Informed Consent: I have reviewed the patients History and Physical, chart, labs and discussed the procedure including the risks, benefits and alternatives for the proposed anesthesia with the patient or authorized representative who has indicated his/her understanding and acceptance.       Plan Discussed with: CRNA and Anesthesiologist  Anesthesia Plan Comments:        Anesthesia Quick Evaluation

## 2018-09-26 NOTE — Discharge Summary (Addendum)
Physician Discharge Summary  [name] IWL:[id number] DOB: 12-29-1965 DOA: 09/22/2018  PCP: [name], MD  Admit date: 09/22/2018 Discharge date: 09/26/2018  Admitted From: home Disposition:  Transfer to [facility]   Recommendations for Outpatient Follow-up:  Follow up with PCP in 1-2 weeks  Follow up with Pulmonary in 1-2 weeks Follow up with GI in 1-2 weeks  Home Health: none Equipment/Devices: none  Discharge Condition: stable CODE STATUS: Full code Diet recommendation: clear liquid   HPI: Per admitting MD, Holly Mueller is a 53 y.o. female with history of interstitial lung disease followed by Dr. milligrams family was recently admitted for hemoptysis was placed on prednisone which patient is on a tapering dose with history of primary biliary cholangitis with cirrhosis, anemia, diabetes mellitus, hypothyroidism since discharge home has been experiencing increasing shortness of breath over the last 9 to 10 days.  Shortness of breath on minimal exertion.  Denies any chest pain has been in chronic cough.  Denies fever chills.  Patient noticed increasing abdominal girth lower extremity edema and a weight gain of around 30 pounds.  Patient had followed up with pulmonology clinic yesterday and was referred to the ER. ED Course: In the ER patient's d-dimer was found to be elevated for which patient underwent CT angiogram of the chest which was negative for PE did show interstitial lung disease features and moderate ascites.  Moderate ascites appears to be new for the patient.  In addition patient was complaining of some left flank pain which has been ongoing for last few weeks.  Denies nausea vomiting had few episodes of diarrhea last week which is resolved.  On exam patient abdomen is distended nontender.  Patient was given Lasix 40 in the ER and admitted for further management of acute respiratory failure likely a combination of possible developing decompensated liver  cirrhosis and interstitial lung disease.  Patient has anemia with hemoglobin is slightly lower than the baseline.  Patient also has known history of leukopenia and thrombocytopenia.  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 Gastroenterology was consulted and followed patient while hospitalized.  She underwent paracentesis on 5/15, findings were consistent with ascites due to portal hypertension, and the cell count showed no evidence of SBP.  She was placed on diuretics with 60 mg twice daily of Lasix along with spironolactone, she diuresed well and her weight has improved from 279 pounds on admission to 242 pounds on discharge.  Given concern for GI bleed due to hemoglobin of 6.5 admission and reported history of black stools a week before she underwent an endoscopy 5/18. There were no esophageal varices, however there was evidence of type I gastric varices located in the fundus with an area of ulceration suggesting stuttering bleeding.  Patient is to continue on clear liquid diet, she was started on nadolol and given high risk of bleeding GI has discussed with [facility] and patient was transferred for further endoscopic treatment which is currently anticoagulated on hospital. She is maintained on protonix and Ceftriaxone currently.   Pulmonary fibrosis -Followed by pulmonology as an outpatient, diagnosed of idiopathic pulmonary fibrosis versus an NSIP versus ILD with autoimmune features.  Patient was recently hospitalized for hemoptysis 3 weeks ago, pulmonary was consulted, and currently is on a prednisone taper.  She is on 10 mg and is supposed  to be that for an additional week.  She has been having intermittent cough which is chronic, improved with Mucinex.  She no longer has hemoptysis and her respiratory status is  stable right now.  She is on room air  Pancytopenia -Likely due to cirrhosis, she has been having worsening hemoglobin, 6.5 on admission requiring 1 unit of packed red blood cells.  Patient describes having black stools last week.  Overall her counts have been stable, her platelets are 118 on discharge, hemoglobin 7.6 and white count 3.6.    Type 2 diabetes mellitus -Continue Lantus and sliding scale  Abdominal pain -left flank, likely related to cough.  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 ([place])   Controlled type 2 diabetes mellitus without complication, without long-term current use of insulin ([facility])   ILD (interstitial lung disease) ([place])   Gastric varices     Discharge Instructions   Allergies as of 09/26/2018      Reactions   Aspirin Nausea Only   Upset stomach      Medication List    STOP taking these medications   acetaminophen 500 MG tablet Commonly known as:  TYLENOL   ibuprofen 200 MG tablet Commonly known as:  ADVIL     TAKE these medications   albuterol 108 (90 Base) MCG/ACT inhaler Commonly known as:  ProAir HFA INHALE 2 PUFFS INTO THE LUNGS EVERY 6 HOURS AS NEEDED FOR SHORTNESS OF BREATH What changed:    how much to take  how to take this  when to take this  reasons to take this  additional instructions   albuterol (2.5 MG/3ML) 0.083% nebulizer solution Commonly known as:  PROVENTIL Take 3 mLs (2.5 mg total) by nebulization every 6 (six) hours as needed for wheezing or shortness of breath. What changed:  Another medication with the same name was changed. Make sure you understand how and when to take each.   blood glucose meter kit and supplies Dispense based on patient and insurance preference. Use up to four times daily as directed. (FOR  ICD-10 E10.9, E11.9).   blood glucose meter kit and supplies Kit Dispense based on patient and insurance preference. Use up to four times daily as directed. (FOR ICD-9 250.00, 250.01).   cefTRIAXone 1 g in sodium chloride 0.9 % 100 mL Inject 1 g into the vein daily. Start taking on:  Sep 27, 2018   cetirizine 10 MG tablet Commonly known as:  ZYRTEC Take 1 tablet (10 mg total) by mouth daily.   dextromethorphan-guaiFENesin 30-600 MG 12hr tablet Commonly known as:  MUCINEX DM Take 1 tablet by mouth 2 (two) times daily.   fluticasone 50 MCG/ACT nasal spray Commonly known as:  FLONASE Place 2 sprays into both nostrils daily.   furosemide 20 MG tablet Commonly known as:  LASIX Take 3 tablets (60 mg total) by mouth 2 (two) times daily.   glucose blood test strip Commonly known as:  True Metrix Blood Glucose Test Use as instructed   insulin aspart 100 UNIT/ML injection Commonly known as:  NovoLOG Use 4 times daily with meals and before bed to administer using sliding scale provided What changed:    how much to take  how to take this  when to take this  additional instructions   insulin glargine 100 UNIT/ML injection Commonly known as:  LANTUS Inject 0.25 mLs (25 Units total) into the skin at bedtime.   INSULIN SYRINGE .3CC/31GX5/16" 31G X 5/16" 0.3 ML Misc Use with lantus and novolog insulin   levothyroxine 125 MCG tablet Commonly known as:  SYNTHROID Take 2 tablets (250 mcg total) by mouth daily.   metFORMIN 500 MG tablet Commonly known as:  GLUCOPHAGE Take 1 tablet (500 mg total) by mouth 2 (two) times daily with a meal. What changed:  how much to take   Mometasone Furoate 200 MCG/ACT Aero Commonly known as:  Asmanex HFA Inhale 2 puffs into the lungs 2 (two) times daily.   nadolol 40 MG tablet Commonly known as:  CORGARD Take 1 tablet (40 mg total) by mouth daily. Start taking on:  Sep 27, 2018   pantoprazole 40 MG tablet Commonly known as:   PROTONIX Take 1 tablet (40 mg total) by mouth 2 (two) times daily before a meal.   predniSONE 10 MG tablet Commonly known as:  DELTASONE Take 4 tabs for 7 days, then 3 tabs for 7 days, then 2 tabs for 7 days, then 1 tab for 7 days, then 1/2 tab for 7 days. What changed:    how much to take  how to take this  when to take this  additional instructions   spironolactone 100 MG tablet Commonly known as:  ALDACTONE Take 1 tablet (100 mg total) by mouth daily. Start taking on:  Sep 27, 2018   traMADol 50 MG tablet Commonly known as:  ULTRAM Take 0.5 tablets (25 mg total) by mouth every 6 (six) hours as needed for moderate pain.   TRUEplus Lancets 28G Misc Use to check blood sugar as directed.      Follow-up Information    [name], MD Follow up on 09/30/2018.   Specialty:  Family Medicine Why:  @ 1:40 pm for hospital follow up appointment. Please call the office if you can't make this scheduled appointment.  Contact information: [place] [id number] [phone]           Consultations:  Gastroenterology   Procedures/Studies:  EGD 5/18   Dg Chest 2 View  Result Date: 09/22/2018 CLINICAL DATA:  Shortness of breath EXAM: CHEST - 2 VIEW COMPARISON:  Chest radiograph August 27, 2018 and chest CT August 28, 2018 FINDINGS: There remains generalized interstitial prominence, largely due to pulmonary fibrosis. No airspace consolidation. Heart size and pulmonary vascularity are normal. No adenopathy. No bone lesions. IMPRESSION: Fairly diffuse interstitial prominence which is felt to be due primarily to interstitial fibrosis. There is not felt to be cardiogenic edema. A mild degree of noncardiogenic edema superimposed is possible. Note that viral type pneumonitis may present in this manner. There is no airspace consolidation. Heart size normal. No adenopathy. Electronically Signed   By: Lowella Grip III M.D.   On: 09/22/2018 14:31   Ct Angio Chest  Pe W/cm &/or Wo Cm  Result Date: 09/22/2018 CLINICAL DATA:  Interstitial lung disease. Dyspnea. Elevated D-dimer. EXAM: CT ANGIOGRAPHY CHEST WITH CONTRAST TECHNIQUE: Multidetector CT imaging of the chest was performed using the standard protocol during bolus administration of intravenous contrast. Multiplanar CT image reconstructions and MIPs were obtained to evaluate the vascular anatomy. Examination repeated once due to poor contrast opacification on the initial attempt. CONTRAST:  269m OMNIPAQUE IOHEXOL 300 MG/ML  SOLN COMPARISON:  Chest radiograph from earlier today. 08/28/2018 chest CT. FINDINGS: Cardiovascular: The study is low to moderate quality for the evaluation of pulmonary embolism, with  motion degradation and suboptimal contrast opacification. There are no convincing filling defects in the central, lobar, segmental or subsegmental pulmonary artery branches to suggest acute pulmonary embolism. Atherosclerotic nonaneurysmal thoracic aorta. Normal caliber pulmonary arteries. Top-normal heart size. No significant pericardial fluid/thickening. Mediastinum/Nodes: Stable hypodense 3.3 cm thyroid isthmus nodule with minimal peripheral calcification. Unremarkable esophagus. No pathologically enlarged axillary, mediastinal or hilar lymph nodes. Lungs/Pleura: No pneumothorax. No pleural effusion. No acute consolidative airspace disease, lung masses or significant pulmonary nodules. Mild patchy confluent subpleural reticulation and ground-glass attenuation throughout both lungs with associated mild architectural distortion, not substantially changed from 04/05/2018 high-resolution chest CT study. Upper abdomen: New small to moderate volume upper abdominal ascites. Irregular liver surface compatible with cirrhosis. Partially visualized splenomegaly. Musculoskeletal: No aggressive appearing focal osseous lesions. Mild thoracic spondylosis. Review of the MIP images confirms the above findings. IMPRESSION: 1.  Limited scan.  No convincing evidence of pulmonary embolism. 2. Cirrhosis.  New small to moderate volume upper abdominal ascites. 3. Spectrum of findings compatible with chronic interstitial lung disease, not substantially changed since 04/05/2018 high-resolution chest CT study. No acute superimposed pulmonary disease. Aortic Atherosclerosis (ICD10-I70.0). Electronically Signed   By: Ilona Sorrel M.D.   On: 09/22/2018 19:04   Ct Angio Chest Pe W Or Wo Contrast  Result Date: 08/28/2018 CLINICAL DATA:  53 y/o  F; chest pain and dyspnea. EXAM: CT ANGIOGRAPHY CHEST WITH CONTRAST TECHNIQUE: Multidetector CT imaging of the chest was performed using the standard protocol during bolus administration of intravenous contrast. Multiplanar CT image reconstructions and MIPs were obtained to evaluate the vascular anatomy. CONTRAST:  173m OMNIPAQUE IOHEXOL 350 MG/ML SOLN COMPARISON:  04/05/2018 CT chest. 08/27/2018 chest radiograph. FINDINGS: Cardiovascular: Preferential opacification of the thoracic aorta. No evidence of thoracic aortic aneurysm or dissection. Normal heart size. No pericardial effusion. Respiratory motion artifact and suboptimal opacification of the pulmonary arteries. No central or proximal segmental pulmonary embolus. Mediastinum/Nodes: Heterogeneous and mildly enlarged thyroid gland. Normal esophagus and trachea. No axillary or mediastinal lymphadenopathy. Lungs/Pleura: Pulmonary fibrosis with peripheral and basilar predominance stable from prior high-resolution chest CT. No consolidation, effusion, or pneumothorax. Upper Abdomen: No acute abnormality. Musculoskeletal: No chest wall abnormality. No acute or significant osseous findings. Review of the MIP images confirms the above findings. IMPRESSION: 1. Respiratory motion artifact and suboptimal opacification of the pulmonary arteries. No central or proximal segmental pulmonary embolus. No acute pulmonary process. 2. Findings of pulmonary fibrosis are  stable from prior high-resolution chest CT. Electronically Signed   By: LKristine GarbeM.D.   On: 08/28/2018 04:17   Dg Chest Port 1 View  Result Date: 08/28/2018 CLINICAL DATA:  53y/o  F; cough, shortness of breath. EXAM: PORTABLE CHEST 1 VIEW COMPARISON:  07/05/2018 chest radiograph. FINDINGS: Stable cardiac silhouette given projection and technique. Low lung volumes. Stable coarse reticular opacities compatible with interstitial lung disease. No new consolidation, effusion, or pneumothorax identified. No acute osseous abnormality is evident. IMPRESSION: Stable low lung volumes and findings of interstitial lung disease. No acute pulmonary process identified. Electronically Signed   By: LKristine GarbeM.D.   On: 08/28/2018 00:21   Dg Abd Portable 1v  Result Date: 08/29/2018 CLINICAL DATA:  Diffuse abd pain and vomiting EXAM: PORTABLE ABDOMEN - 1 VIEW COMPARISON:  CT abdomen 05/26/2012 FINDINGS: No dilated loops of large or small bowel. Gas and stool in the rectum. No pathologic calcifications. No organomegaly. No acute osseous abnormality IMPRESSION: No acute abdominal findings. Electronically Signed   By: SHelane GuntherD.  On: 08/29/2018 11:51   Ct Renal Stone Study  Result Date: 09/23/2018 CLINICAL DATA:  Flank pain, stone disease suspected EXAM: CT ABDOMEN AND PELVIS WITHOUT CONTRAST TECHNIQUE: Multidetector CT imaging of the abdomen and pelvis was performed following the standard protocol without IV contrast. Patient IV contrast 6 hours prior for chest CTA. COMPARISON:  Radiograph 08/29/2018, noncontrast CT 05/26/2012 FINDINGS: Lower chest: Basilar interstitial lung disease as seen on chest CT 6 hours ago, unchanged. Hepatobiliary: Hepatic cirrhosis with nodular contours. No focal lesion on noncontrast exam. Contracted gallbladder without calcified gallstone. No biliary dilatation. Pancreas: No ductal dilatation or inflammation. Spleen: Splenomegaly with spleen spanning  18.8 x 8.6 x 18.2 cm. Previous splenic cyst is no longer seen. Adrenals/Urinary Tract: No obvious adrenal nodule, upper abdominal varices and lack contrast partially obscure evaluation. No hydronephrosis. Excreted IV contrast within both renal collecting systems from prior chest CTA. No perinephric edema. No obvious renal stone, detailed evaluation limited by excreted contrast. Excreted contrast in the urinary bladder without wall thickening. Stomach/Bowel: Bowel evaluation is limited in the absence of enteric contrast and presence of intra-abdominal ascites. There perigastric and probable paraesophageal varices. Stomach physiologically distended. No bowel obstruction or obvious inflammatory change. Mild distal colonic diverticulosis without diverticulitis. Vascular/Lymphatic: Upper abdominal varices, largest in the left upper quadrant. Abdominal aorta is normal in caliber. Limited assessment for adenopathy given ascites and lack contrast. Enlarged lymph nodes in the porta hepatis. Reproductive: Uterus and bilateral adnexa are unremarkable. Other: Moderate volume abdominopelvic ascites. Mild mesenteric ascites and mesenteric edema. Whole body wall edema, confluent in the abdominal pannus and flanks. No free air. Repair prior umbilical hernia. Musculoskeletal: Chronic bilateral L5 pars interarticularis defects with grade 1 anterolisthesis of L5 on S1. There are no acute or suspicious osseous abnormalities. IMPRESSION: 1. No obstructive uropathy. Excreted IV contrast in the renal collecting systems from prior chest CT limits detailed assessment for renal stone. 2. Hepatic cirrhosis with portal hypertension and splenomegaly. Moderate volume abdominopelvic ascites. Upper abdominal and perigastric varices. 3. Chronic bilateral L5 pars interarticularis defects with grade 1 anterolisthesis of L5 on S1. Electronically Signed   By: [name] M.D.   On: 09/23/2018 01:01   US Liver Doppler  Result Date:  09/24/2018 CLINICAL DATA:  Hepatic cirrhosis. EXAM: DUPLEX ULTRASOUND OF LIVER TECHNIQUE: Color and duplex Doppler ultrasound was performed to evaluate the hepatic in-flow and out-flow vessels. COMPARISON:  Abdominal CT 09/23/2018 FINDINGS: Liver: Nodular contour and compatible with cirrhosis. No discrete liver lesion. Liver parenchyma is heterogeneous. Main Portal Vein size: 1.7 cm Portal Vein Velocities Main Prox:  28 cm/sec Main Mid: 39 cm/sec Main Dist:  35 cm/sec Right: 32 cm/sec Left: 26 cm/sec Hepatic Vein Velocities Right:  50 cm/sec Middle:  49 cm/sec Left:  49 cm/sec IVC: Present and patent with normal respiratory phasicity. Hepatic Artery Velocity:  72 cm/sec Splenic Vein Velocity:  47 cm/sec Spleen: 19.3 x 20.9 x 8.6 cm with a total volume of 1819 cm^3 (411 cm^3 is upper limit normal) Portal Vein Occlusion/Thrombus: No Splenic Vein Occlusion/Thrombus: No Ascites: Present Varices: None Normal hepatopetal flow in the portal veins. Normal hepatofugal flow in the hepatic veins. IMPRESSION: 1. Hepatic cirrhosis with evidence for portal hypertension based on the splenomegaly and ascites. 2. Patent portal venous system with normal direction of flow. Electronically Signed   By: Markus Daft M.D.   On: 09/24/2018 16:00   Vas [place] Lower Extremity Venous (dvt)  Result Date: 08/28/2018  Lower Venous Study Indications: SOB.  Risk Factors: Obesity.  Performing Technologist: Toma Copier RVS  Examination Guidelines: A complete evaluation includes B-mode imaging, spectral Doppler, color Doppler, and power Doppler as needed of all accessible portions of each vessel. Bilateral testing is considered an integral part of a complete examination. Limited examinations for reoccurring indications may be performed as noted.  +---------+---------------+---------+-----------+----------+------------------+  RIGHT     Compressibility Phasicity Spontaneity Properties Summary              +---------+---------------+---------+-----------+----------+------------------+  CFV       Full            Yes       Yes                                        +---------+---------------+---------+-----------+----------+------------------+  SFJ       Full                                                                 +---------+---------------+---------+-----------+----------+------------------+  FV Prox   Full            Yes       Yes                                        +---------+---------------+---------+-----------+----------+------------------+  FV Mid    Full                                                                 +---------+---------------+---------+-----------+----------+------------------+  FV Distal Full            Yes       Yes                                        +---------+---------------+---------+-----------+----------+------------------+  PFV       Full            Yes       Yes                                        +---------+---------------+---------+-----------+----------+------------------+  POP       Full            Yes       Yes                                        +---------+---------------+---------+-----------+----------+------------------+  PTV       Full                                                                 +---------+---------------+---------+-----------+----------+------------------+  PERO      Full                                             Difficult to image  +---------+---------------+---------+-----------+----------+------------------+   +---------+---------------+---------+-----------+----------+------------------+  LEFT      Compressibility Phasicity Spontaneity Properties Summary             +---------+---------------+---------+-----------+----------+------------------+  CFV       Full            Yes       Yes                                        +---------+---------------+---------+-----------+----------+------------------+  SFJ       Full                                                                  +---------+---------------+---------+-----------+----------+------------------+  FV Prox   Full            Yes       Yes                                        +---------+---------------+---------+-----------+----------+------------------+  FV Mid    Full                                                                 +---------+---------------+---------+-----------+----------+------------------+  FV Distal Full            Yes       Yes                                        +---------+---------------+---------+-----------+----------+------------------+  PFV       Full            Yes       Yes                                        +---------+---------------+---------+-----------+----------+------------------+  POP       Full            Yes       Yes                                        +---------+---------------+---------+-----------+----------+------------------+  PTV       Full  Difficult to image  +---------+---------------+---------+-----------+----------+------------------+  PERO      Full                                             Difficult to image  +---------+---------------+---------+-----------+----------+------------------+     Summary: Right: There is no evidence of deep vein thrombosis in the lower extremity. No cystic structure found in the popliteal fossa. Left: There is no evidence of deep vein thrombosis in the lower extremity. No cystic structure found in the popliteal fossa.  *See table(s) above for measurements and observations. Electronically signed by Servando Snare MD on 08/28/2018 at 12:35:40 PM.    Final    Ir Paracentesis  Result Date: 09/23/2018 INDICATION: History of cirrhosis. Abdominal distention and weight gain. Ascites. Request for diagnostic therapeutic paracentesis. EXAM: ULTRASOUND GUIDED RIGHT LOWER QUADRANT PARACENTESIS MEDICATIONS: None. COMPLICATIONS: None immediate. PROCEDURE:  Informed written consent was obtained from the patient after a discussion of the risks, benefits and alternatives to treatment. A timeout was performed prior to the initiation of the procedure. Initial ultrasound scanning demonstrates a small to moderate amount of ascites within the right lower abdominal quadrant. The right lower abdomen was prepped and draped in the usual sterile fashion. 1% lidocaine with epinephrine was used for local anesthesia. Following this, a 19 gauge, 15-cm, Yueh catheter was introduced. An ultrasound image was saved for documentation purposes. The paracentesis was performed. The catheter was removed and a dressing was applied. The patient tolerated the procedure well without immediate post procedural complication. FINDINGS: A total of approximately 1850 mL of hazy yellow fluid was removed. Samples were sent to the laboratory as requested by the clinical team. IMPRESSION: Successful ultrasound-guided paracentesis yielding 1850 mL of peritoneal fluid. Read by: Ascencion Dike PA-C Electronically Signed   By: Sandi Mariscal M.D.   On: 09/23/2018 11:50     Subjective: - no chest pain, shortness of breath, no abdominal pain, nausea or vomiting. Breathing improved significantly with diuresis   Discharge Exam: BP 126/84 (BP Location: Right Arm)    Pulse 99    Temp 98.7 F (37.1 C) (Oral)    Resp 18    Ht _0  (1.676 m)    Wt 109.9 kg    LMP  (LMP Unknown)    SpO2 96%    BMI 39.11 kg/m   General: Pt is alert, awake, not in acute distress Cardiovascular: RRR, S1/S2 +, no rubs, no gallops Respiratory: CTA bilaterally, no wheezing, no rhonchi Abdominal: Soft, NT, ND, bowel sounds + Extremities: no edema, no cyanosis    The results of significant diagnostics from this hospitalization (including imaging, microbiology, ancillary and laboratory) are listed below for reference.     Microbiology: Recent Results (from the past 240 hour(s))  SARS Coronavirus 2 (CEPHEID - Performed in  [place] hospital lab), Hosp Order     Status: None   Collection Time: 09/22/18  8:15 PM  Result Value Ref Range Status   SARS Coronavirus 2 NEGATIVE NEGATIVE Final    Comment: (NOTE) If result is NEGATIVE SARS-CoV-2 target nucleic acids are NOT DETECTED. The SARS-CoV-2 RNA is generally detectable in upper and lower  respiratory specimens during the acute phase of infection. The lowest  concentration of SARS-CoV-2 viral copies this assay can detect is 250  copies / mL. A negative result does not preclude SARS-CoV-2 infection  and should not be  used as the sole basis for treatment or other  patient management decisions.  A negative result may occur with  improper specimen collection / handling, submission of specimen other  than nasopharyngeal swab, presence of viral mutation(s) within the  areas targeted by this assay, and inadequate number of viral copies  (<250 copies / mL). A negative result must be combined with clinical  observations, patient history, and epidemiological information. If result is POSITIVE SARS-CoV-2 target nucleic acids are DETECTED. The SARS-CoV-2 RNA is generally detectable in upper and lower  respiratory specimens dur ing the acute phase of infection.  Positive  results are indicative of active infection with SARS-CoV-2.  Clinical  correlation with patient history and other diagnostic information is  necessary to determine patient infection status.  Positive results do  not rule out bacterial infection or co-infection with other viruses. If result is PRESUMPTIVE POSTIVE SARS-CoV-2 nucleic acids MAY BE PRESENT.   A presumptive positive result was obtained on the submitted specimen  and confirmed on repeat testing.  While 2019 novel coronavirus  (SARS-CoV-2) nucleic acids may be present in the submitted sample  additional confirmatory testing may be necessary for epidemiological  and / or clinical management purposes  to differentiate between  SARS-CoV-2  and other Sarbecovirus currently known to infect humans.  If clinically indicated additional testing with an alternate test  methodology [phone]) is advised. The SARS-CoV-2 RNA is generally  detectable in upper and lower respiratory sp ecimens during the acute  phase of infection. The expected result is Negative. Fact Sheet for Patients:  StrictlyIdeas.no Fact Sheet for Healthcare Providers: BankingDealers.co.za This test is not yet approved or cleared by the [place] FDA and has been authorized for detection and/or diagnosis of SARS-CoV-2 by FDA under an Emergency Use Authorization (EUA).  This EUA will remain in effect (meaning this test can be used) for the duration of the COVID-19 declaration under Section 564(b)(1) of the Act, 21 U.S.C. section 360bbb-3(b)(1), unless the authorization is terminated or revoked sooner. Performed at [facility], [place] [address], [place], [place] [address]   Gram stain     Status: None   Collection Time: 09/23/18 10:38 AM  Result Value Ref Range Status   Specimen Description PERITONEAL FLUID  Final   Special Requests NONE  Final   Gram Stain   Final    RARE WBC PRESENT, PREDOMINANTLY MONONUCLEAR NO ORGANISMS SEEN Performed at [facility], [address] [place], [place] [address]    Report Status 09/23/2018 FINAL  Final  Culture, body fluid-bottle     Status: None (Preliminary result)   Collection Time: 09/23/18 10:38 AM  Result Value Ref Range Status   Specimen Description PERITONEAL fluid for immunophenotyping by flow  Final   Special Requests NONE  Final   Culture   Final    NO GROWTH 3 DAYS Performed at [facility], [place] [address] [place], [place] [address]    Report Status PENDING  Incomplete     Labs: BNP (last 3 results) Recent Labs    03/03/18 1108 03/12/18 1021 08/28/18 0646  BNP 82.6 6.6 9.3   Basic Metabolic Panel: Recent Labs  Lab  09/22/18 1420 09/22/18 1531 09/24/18 0223 09/25/18 0240 09/26/18 0416  NA 140 140 140 136 135  K 3.9 4.0 3.2* 3.5 3.6  CL 110 111 107 104 104  CO2 22 19* _0 GLUCOSE 227* 243* 113* 115* 154*  BUN _1 10  19  CREATININE 0.63 0.74 0.73 0.71 0.68  CALCIUM 8.2* 8.6* 8.1* 8.2* 8.0*   Liver Function Tests: Recent Labs  Lab 09/22/18 1420 09/23/18 0226 09/24/18 0223  AST 36 32 35  ALT 38* 37 35  ALKPHOS 165* 117 90  BILITOT 1.6* 1.3* 1.7*  PROT 6.3 5.8* 6.0*  ALBUMIN 3.0* 2.5* 2.6*   No results for input(s): LIPASE, AMYLASE in the last 168 hours. No results for input(s): AMMONIA in the last 168 hours. CBC: Recent Labs  Lab 09/22/18 1420  09/23/18 0226 09/23/18 1148 09/24/18 0223 09/25/18 0240 09/26/18 0416  WBC 1.7 Repeated and verified X2.*   < > 2.1* 1.8* 1.6* 2.0* 3.6*  NEUTROABS 1.2*  --  1.0*  --   --   --   --   HGB 7.7 Repeated and verified X2.*   < > 6.5* 7.8* 7.6* 7.8* 7.6*  HCT 23.6 Repeated and verified X2.*   < > 21.3* 25.4* 24.2* 25.3* 24.6*  MCV 81.4   < > 83.9 83.3 82.9 83.0 82.3  PLT 67.0 Repeated and verified X2.*   < > 68* 75* 78* 80* 119*   < > = values in this interval not displayed.   Cardiac Enzymes: Recent Labs  Lab 09/22/18 2349  TROPONINI <0.03   BNP: Invalid input(s): POCBNP CBG: Recent Labs  Lab 09/25/18 1642 09/25/18 2131 09/26/18 0555 09/26/18 0801 09/26/18 1148  GLUCAP 220* 179* 166* 121* 179*   D-Dimer No results for input(s): DDIMER in the last 72 hours. Hgb A1c No results for input(s): HGBA1C in the last 72 hours. Lipid Profile No results for input(s): CHOL, HDL, LDLCALC, TRIG, CHOLHDL, LDLDIRECT in the last 72 hours. Thyroid function studies No results for input(s): TSH, T4TOTAL, T3FREE, THYROIDAB in the last 72 hours.  Invalid input(s): FREET3 Anemia work up No results for input(s): VITAMINB12, FOLATE, FERRITIN, TIBC, IRON, RETICCTPCT in the last 72 hours. Urinalysis    Component Value Date/Time    COLORURINE YELLOW 12/28/2017 1950   APPEARANCEUR CLEAR 12/28/2017 1950   LABSPEC 1.012 12/28/2017 1950   PHURINE 7.0 12/28/2017 1950   GLUCOSEU NEGATIVE 12/28/2017 1950   HGBUR SMALL (A) 12/28/2017 1950   BILIRUBINUR NEGATIVE 12/28/2017 1950   KETONESUR NEGATIVE 12/28/2017 1950   PROTEINUR NEGATIVE 12/28/2017 1950   UROBILINOGEN 2.0 (H) 11/22/2013 1743   NITRITE NEGATIVE 12/28/2017 1950   LEUKOCYTESUR NEGATIVE 12/28/2017 1950   Sepsis Labs Invalid input(s): PROCALCITONIN,  WBC,  LACTICIDVEN  FURTHER DISCHARGE INSTRUCTIONS:   Get Medicines reviewed and adjusted: Please take all your medications with you for your next visit with your Primary MD   Laboratory/radiological data: Please request your Primary MD to go over all hospital tests and procedure/radiological results at the follow up, please ask your Primary MD to get all Hospital records sent to his/her office.   In some cases, they will be blood work, cultures and biopsy results pending at the time of your discharge. Please request that your primary care M.D. goes through all the records of your hospital data and follows up on these results.   Also Note the following: If you experience worsening of your admission symptoms, develop shortness of breath, life threatening emergency, suicidal or homicidal thoughts you must seek medical attention immediately by calling 911 or calling your MD immediately  if symptoms less severe.   You must read complete instructions/literature along with all the possible adverse reactions/side effects for all the Medicines you take and that have been prescribed to you. Take any  new Medicines after you have completely understood and accpet all the possible adverse reactions/side effects.    Do not drive when taking Pain medications or sleeping medications (Benzodaizepines)   Do not take more than prescribed Pain, Sleep and Anxiety Medications. It is not advisable to combine anxiety,sleep and pain  medications without talking with your primary care practitioner   Special Instructions: If you have smoked or chewed Tobacco  in the last 2 yrs please stop smoking, stop any regular Alcohol  and or any Recreational drug use.   Wear Seat belts while driving.   Please note: You were cared for by a hospitalist during your hospital stay. Once you are discharged, your primary care physician will handle any further medical issues. Please note that NO REFILLS for any discharge medications will be authorized once you are discharged, as it is imperative that you return to your primary care physician (or establish a relationship with a primary care physician if you do not have one) for your post hospital discharge needs so that they can reassess your need for medications and monitor your lab values.  Time coordinating discharge: 45 minutes  SIGNED:  Marzetta Board, MD, PhD 09/26/2018, 3:26 PM

## 2018-09-27 ENCOUNTER — Encounter: Payer: Self-pay | Admitting: Family Medicine

## 2018-09-28 ENCOUNTER — Encounter (HOSPITAL_COMMUNITY): Payer: Self-pay | Admitting: Gastroenterology

## 2018-09-28 ENCOUNTER — Encounter: Payer: Self-pay | Admitting: Gastroenterology

## 2018-09-28 LAB — CULTURE, BODY FLUID W GRAM STAIN -BOTTLE: Culture: NO GROWTH

## 2018-09-30 ENCOUNTER — Ambulatory Visit: Payer: Medicaid Other | Admitting: Family Medicine

## 2018-09-30 MED ORDER — ALBUTEROL SULFATE 2.5 MG/0.5ML IN NEBU
2.50 | INHALATION_SOLUTION | RESPIRATORY_TRACT | Status: DC
Start: ? — End: 2018-09-30

## 2018-09-30 MED ORDER — GENERIC EXTERNAL MEDICATION
Status: DC
Start: ? — End: 2018-09-30

## 2018-09-30 MED ORDER — DEXTROSE 50 % IV SOLN
12.50 | INTRAVENOUS | Status: DC
Start: ? — End: 2018-09-30

## 2018-09-30 MED ORDER — GENERIC EXTERNAL MEDICATION
250.00 | Status: DC
Start: 2018-10-04 — End: 2018-09-30

## 2018-09-30 MED ORDER — MELATONIN 3 MG PO TABS
3.00 | ORAL_TABLET | ORAL | Status: DC
Start: 2018-10-03 — End: 2018-09-30

## 2018-09-30 MED ORDER — PREDNISONE 5 MG PO TABS
5.00 | ORAL_TABLET | ORAL | Status: DC
Start: 2018-10-04 — End: 2018-09-30

## 2018-09-30 MED ORDER — GENERIC EXTERNAL MEDICATION
1.00 | Status: DC
Start: 2018-10-01 — End: 2018-09-30

## 2018-09-30 MED ORDER — INSULIN REGULAR HUMAN 100 UNIT/ML IJ SOLN
0.00 | INTRAMUSCULAR | Status: DC
Start: 2018-10-03 — End: 2018-09-30

## 2018-09-30 MED ORDER — MIDODRINE HCL 5 MG PO TABS
5.00 | ORAL_TABLET | ORAL | Status: DC
Start: 2018-09-30 — End: 2018-09-30

## 2018-09-30 MED ORDER — GLUCAGON HCL RDNA (DIAGNOSTIC) 1 MG IJ SOLR
1.00 | INTRAMUSCULAR | Status: DC
Start: ? — End: 2018-09-30

## 2018-09-30 MED ORDER — PANTOPRAZOLE SODIUM 40 MG PO TBEC
40.00 | DELAYED_RELEASE_TABLET | ORAL | Status: DC
Start: 2018-10-03 — End: 2018-09-30

## 2018-09-30 MED ORDER — INSULIN GLARGINE 100 UNIT/ML ~~LOC~~ SOLN
10.00 | SUBCUTANEOUS | Status: DC
Start: 2018-10-03 — End: 2018-09-30

## 2018-09-30 MED ORDER — HEPARIN SODIUM (PORCINE) 5000 UNIT/ML IJ SOLN
5000.00 | INTRAMUSCULAR | Status: DC
Start: 2018-09-30 — End: 2018-09-30

## 2018-10-03 MED ORDER — ACETAMINOPHEN 325 MG PO TABS
650.00 | ORAL_TABLET | ORAL | Status: DC
Start: ? — End: 2018-10-03

## 2018-10-03 MED ORDER — FLUTICASONE PROPIONATE 50 MCG/ACT NA SUSP
2.00 | NASAL | Status: DC
Start: 2018-10-04 — End: 2018-10-03

## 2018-10-03 MED ORDER — MOMETASONE FUROATE 220 MCG/INH IN AEPB
2.00 | INHALATION_SPRAY | RESPIRATORY_TRACT | Status: DC
Start: 2018-10-03 — End: 2018-10-03

## 2018-10-03 MED ORDER — ONDANSETRON HCL 4 MG/2ML IJ SOLN
4.00 | INTRAMUSCULAR | Status: DC
Start: ? — End: 2018-10-03

## 2018-10-04 NOTE — Telephone Encounter (Signed)
Patient calling back regarding this. She is requesting to speak to Pj.

## 2018-10-04 NOTE — Telephone Encounter (Signed)
See if there is a way I could see her in office tomorrow when I am done scoping

## 2018-10-04 NOTE — Telephone Encounter (Signed)
Patient was admitted to [facility] and is asking to be seen. I told her I am going to send a message to Dr Carlean Purl to advise. Looks like in 4 weeks she is to have an EUS at [facility]. In the notes it says f/u with her liver Doctor. Patient is aware that Dr Carlean Purl is out to the office today and she may not get a call back today.

## 2018-10-04 NOTE — Telephone Encounter (Signed)
Can you see her at noon? Her brother can't bring her late in the day.

## 2018-10-05 ENCOUNTER — Telehealth: Payer: Self-pay

## 2018-10-05 NOTE — Telephone Encounter (Signed)
Covid-19 travel screening questions  Have you traveled in the last 14 days? Yes  If yes where? [facility]  Do you now or have you had a fever in the last 14 days? no  Do you have any respiratory symptoms of shortness of breath or cough now or in the last 14 days? Some coughing  Do you have a medical history of Congestive Heart Failure? no  Do you have a medical history of lung disease? yes  Do you have any family members or close contacts with diagnosed or suspected Covid-19? no    Patient on to come in tomorrow 10/06/2018 at 12:00pm

## 2018-10-06 ENCOUNTER — Other Ambulatory Visit: Payer: Self-pay

## 2018-10-06 ENCOUNTER — Other Ambulatory Visit (INDEPENDENT_AMBULATORY_CARE_PROVIDER_SITE_OTHER): Payer: Self-pay

## 2018-10-06 ENCOUNTER — Ambulatory Visit (INDEPENDENT_AMBULATORY_CARE_PROVIDER_SITE_OTHER): Payer: Self-pay | Admitting: Internal Medicine

## 2018-10-06 VITALS — BP 104/60 | HR 85 | Temp 98.0°F | Ht 66.0 in | Wt 238.0 lb

## 2018-10-06 DIAGNOSIS — D62 Acute posthemorrhagic anemia: Secondary | ICD-10-CM

## 2018-10-06 DIAGNOSIS — Z5971 Insufficient health insurance coverage: Secondary | ICD-10-CM

## 2018-10-06 DIAGNOSIS — Z5989 Other problems related to housing and economic circumstances: Secondary | ICD-10-CM

## 2018-10-06 DIAGNOSIS — I864 Gastric varices: Secondary | ICD-10-CM

## 2018-10-06 DIAGNOSIS — K743 Primary biliary cirrhosis: Secondary | ICD-10-CM

## 2018-10-06 DIAGNOSIS — Z598 Other problems related to housing and economic circumstances: Secondary | ICD-10-CM

## 2018-10-06 LAB — CBC WITH DIFFERENTIAL/PLATELET
Basophils Absolute: 0 10*3/uL (ref 0.0–0.1)
Basophils Relative: 2.7 % (ref 0.0–3.0)
Eosinophils Absolute: 0 10*3/uL (ref 0.0–0.7)
Eosinophils Relative: 3.1 % (ref 0.0–5.0)
HCT: 29.3 % — ABNORMAL LOW (ref 36.0–46.0)
Hemoglobin: 10.1 g/dL — ABNORMAL LOW (ref 12.0–15.0)
Lymphocytes Relative: 26.3 % (ref 12.0–46.0)
Lymphs Abs: 0.4 10*3/uL — ABNORMAL LOW (ref 0.7–4.0)
MCHC: 34.4 g/dL (ref 30.0–36.0)
MCV: 85.1 fl (ref 78.0–100.0)
Monocytes Absolute: 0.2 10*3/uL (ref 0.1–1.0)
Monocytes Relative: 14.5 % — ABNORMAL HIGH (ref 3.0–12.0)
Neutro Abs: 0.8 10*3/uL — ABNORMAL LOW (ref 1.4–7.7)
Neutrophils Relative %: 53.4 % (ref 43.0–77.0)
Platelets: 82 10*3/uL — ABNORMAL LOW (ref 150.0–400.0)
RBC: 3.45 Mil/uL — ABNORMAL LOW (ref 3.87–5.11)
RDW: 19.7 % — ABNORMAL HIGH (ref 11.5–15.5)
WBC: 1.6 10*3/uL — CL (ref 4.0–10.5)

## 2018-10-06 LAB — COMPREHENSIVE METABOLIC PANEL
ALT: 49 U/L — ABNORMAL HIGH (ref 0–35)
AST: 64 U/L — ABNORMAL HIGH (ref 0–37)
Albumin: 3.4 g/dL — ABNORMAL LOW (ref 3.5–5.2)
Alkaline Phosphatase: 154 U/L — ABNORMAL HIGH (ref 39–117)
BUN: 7 mg/dL (ref 6–23)
CO2: 23 mEq/L (ref 19–32)
Calcium: 8.9 mg/dL (ref 8.4–10.5)
Chloride: 111 mEq/L (ref 96–112)
Creatinine, Ser: 0.61 mg/dL (ref 0.40–1.20)
GFR: 102.37 mL/min (ref >60.00)
Glucose, Bld: 143 mg/dL — ABNORMAL HIGH (ref 70–99)
Potassium: 3.9 mEq/L (ref 3.5–5.1)
Sodium: 141 mEq/L (ref 135–145)
Total Bilirubin: 1.8 mg/dL — ABNORMAL HIGH (ref 0.2–1.2)
Total Protein: 6.8 g/dL (ref 6.0–8.3)

## 2018-10-06 NOTE — Progress Notes (Signed)
Holly Mueller 53 y.o. 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 CBC with Differential/Platelet  . Comprehensive metabolic panel     Gastric varices with bleeding S/p cyanoacrylate injection at [facility] in late May 2020 To have a f/u EUS late June w/ possible reinjection - transportation issues may prevent  ? If we could do EUS here - we can but do not have cyanoacrylate to inject Will see what I can come up with for her  Lab Results  Component Value Date   WBC 1.6 Repeated and verified X2. (LL) 10/06/2018   HGB 10.1 (L) 10/06/2018   HCT 29.3 (L) 10/06/2018   MCV 85.1 10/06/2018   PLT 82.0 Repeated and verified X2. (L) 10/06/2018   Lab Results  Component Value Date   ALT 49 (H) 10/06/2018   AST 64 (H) 10/06/2018   ALKPHOS 154 (H) 10/06/2018   BILITOT 1.8 (H) 10/06/2018   Lab Results  Component Value Date   CREATININE 0.61 10/06/2018   BUN 7 10/06/2018   NA 141 10/06/2018   K 3.9 10/06/2018   CL 111 10/06/2018   CO2 23 10/06/2018      Subjective:   Chief Complaint: f/u cirrhosis  HPI The patient is seen after hospitalizations at [facility] and then [facility]. Admitted to [facility] with ascites/anasarca and hx of hematemesis and melena. She was transferred to [facility] because of a gastric varyx with stigmata of recent bleeding - and then had cyanoacrylate injection into the varyx as well as several transfusions of RBC's. She did have melena and bleeding prior to the cyanoacrylate. She was dced from [facility] on 5/25/20120. On a prednisone taper for ILD. She says she feels quite good compared to recent past - diuresis and paracentesis helped significantly as did transfusions. No ascites  mentioned on CT at [facility] - she has a small fusiform splenic artery aneurysm that was worked up further.  She is supposed to return to [facility] for EUS and possible repeat cyanoacrylate in late June but says that will be most difficult as they want her to return twice - ? If she is to get a Covid-19 test prior to the EUS (my guess)  She remains unemployed and uninsured and says she has not been able to get disability. She has not been on ursodiol in some time.    Wt Readings from Last 3 Encounters:  10/06/18 238 lb (108 kg)  09/26/18 242 lb 4.6 oz (109.9 kg)  09/22/18 279 lb 6.4 oz (126.7 kg)    Allergies  Allergen Reactions  . Aspirin Nausea Only    Upset stomach   Current Meds  Medication Sig  . albuterol (PROAIR HFA) 108 (90 Base) MCG/ACT inhaler INHALE 2 PUFFS INTO THE LUNGS EVERY 6 HOURS AS NEEDED FOR SHORTNESS OF BREATH (Patient taking differently: Inhale 2 puffs into the lungs every 4 (four) hours as needed for wheezing or shortness of breath. )  . albuterol (PROVENTIL) (2.5 MG/3ML) 0.083% nebulizer solution Take 3 mLs (2.5 mg total) by nebulization every 6 (six) hours as needed for wheezing or shortness of breath.  . blood glucose meter kit and supplies KIT Dispense based on  patient and insurance preference. Use up to four times daily as directed. (FOR ICD-9 250.00, 250.01).  . blood glucose meter kit and supplies Dispense based on patient and insurance preference. Use up to four times daily as directed. (FOR ICD-10 E10.9, E11.9).  . cetirizine (ZYRTEC) 10 MG tablet Take 1 tablet (10 mg total) by mouth daily.  Marland Kitchen dextromethorphan-guaiFENesin (MUCINEX DM) 30-600 MG 12hr tablet Take 1 tablet by mouth 2 (two) times daily.  . fluticasone (FLONASE) 50 MCG/ACT nasal spray Place 2 sprays into both nostrils daily.  Marland Kitchen glucose blood (TRUE METRIX BLOOD GLUCOSE TEST) test strip Use as instructed  . insulin aspart (NOVOLOG) 100 UNIT/ML injection Use 4 times daily with meals and before bed to  administer using sliding scale provided  . insulin glargine (LANTUS) 100 UNIT/ML injection Inject 0.25 mLs (25 Units total) into the skin at bedtime. (Patient taking differently: Inject 10 Units into the skin at bedtime. )  . Insulin Syringe-Needle U-100 (INSULIN SYRINGE .3CC/31GX5/16") 31G X 5/16" 0.3 ML MISC Use with lantus and novolog insulin  . levothyroxine (SYNTHROID, LEVOTHROID) 125 MCG tablet Take 2 tablets (250 mcg total) by mouth daily.  . metFORMIN (GLUCOPHAGE) 500 MG tablet Take 1 tablet (500 mg total) by mouth 2 (two) times daily with a meal.  . Mometasone Furoate (ASMANEX HFA) 200 MCG/ACT AERO Inhale 2 puffs into the lungs 2 (two) times daily.  . nadolol (CORGARD) 40 MG tablet Take 1 tablet (40 mg total) by mouth daily.  . pantoprazole (PROTONIX) 40 MG tablet Take 1 tablet (40 mg total) by mouth 2 (two) times daily before a meal.  . predniSONE (DELTASONE) 10 MG tablet Take 4 tabs for 7 days, then 3 tabs for 7 days, then 2 tabs for 7 days, then 1 tab for 7 days, then 1/2 tab for 7 days. (Patient taking differently: Take 5-40 mg by mouth See admin instructions. Take 40 mg by mouth once a day for 7 days, 30 mg once a day for 7 days, 20 mg once a day for 7 days, 10 mg once a day for 7 days, then 5 mg once a day for 7 days as directed)  . TRUEplus Lancets 28G MISC Use to check blood sugar as directed.   Past Medical History:  Diagnosis Date  . Acute respiratory failure with hypoxia ([place]) 09/22/2018  . Arthritis    bursitis left hip flares-not an issue  . Diabetes mellitus without complication ([place])   . Edentulous    10-19-13 at present  . Epilepsy ([place]) in 1972   No seizures since 1972. Previously treated with phenobarbital.   . Gastric varices with bleeding   . GERD (gastroesophageal reflux disease) 2008  . Headache(784.0)    hx of migraines   . 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 Hypothyroidism 2008  . Lower esophageal ring 08/18/2013  . Seasonal  allergies 2003  . Shortness of breath    Past Surgical History:  Procedure Laterality Date  . BALLOON DILATION N/A 10/24/2013   Procedure: BALLOON DILATION;  Surgeon: Gatha Mayer, MD;  Location: [facility];  Service: Endoscopy;  Laterality: N/A;  . BIOPSY  09/26/2018   Procedure: BIOPSY;  Surgeon: Thornton Park, MD;  Location: [place];  Service: Gastroenterology;;  . CESAREAN SECTION     x2  . DENTAL SURGERY     multiple extractions 3'15  . ESOPHAGOGASTRODUODENOSCOPY N/A 10/24/2013   Procedure: ESOPHAGOGASTRODUODENOSCOPY (EGD);  Surgeon: Gatha Mayer, MD;  Location: [facility];  Service: Endoscopy;  Laterality: N/A;  . ESOPHAGOGASTRODUODENOSCOPY (EGD) WITH PROPOFOL N/A 09/26/2018   Procedure: ESOPHAGOGASTRODUODENOSCOPY (EGD) WITH PROPOFOL;  Surgeon: Thornton Park, MD;  Location: [place];  Service: Gastroenterology;  Laterality: N/A;  . IR PARACENTESIS  09/23/2018  . LIVER BIOPSY  06/30/2012   Procedure: LIVER BIOPSY;  Surgeon: Shann Medal, MD;  Location: [facility];  Service: General;;  . NOVASURE ABLATION    . SHOULDER ARTHROSCOPY Right 2011  . UMBILICAL HERNIA REPAIR N/A 06/30/2012   Procedure: remove umbilicus;  Surgeon: Shann Medal, MD;  Location: [facility];  Service: General;  Laterality: N/A;  . VENTRAL HERNIA REPAIR N/A 06/30/2012   Procedure: LAPAROSCOPIC VENTRAL HERNIA;  Surgeon: Shann Medal, MD;  Location: [facility];  Service: General;  Laterality: N/A;  With Mesh  . VIDEO BRONCHOSCOPY Bilateral 07/20/2018   Procedure: VIDEO BRONCHOSCOPY WITHOUT FLUORO;  Surgeon: Brand Males, MD;  Location: [facility];  Service: Cardiopulmonary;  Laterality: Bilateral;  . 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   Review of Systems As above  Objective:   Physical Exam BP 104/60   Pulse 85   Temp 98 F (36.7 C)   Ht _0  (1.676 m)   Wt 238 lb (108 kg)   BMI 38.41 kg/m  NAD Anicteric Lungs CTA Cor NL s1s2 no rmg abd obese soft and NT no sign of ascites but possible No c/ce in any extremities Alert and oriented x 3 No stigmata of chronic liver disease seen on skin

## 2018-10-06 NOTE — Assessment & Plan Note (Signed)
Continues to be an issue

## 2018-10-06 NOTE — Patient Instructions (Signed)
   Your provider has requested that you go to the basement level for lab work before leaving today. Press "B" on the elevator. The lab is located at the first door on the left as you exit the elevator.   We are going to look into you getting your procedures done in [place] instead of at [facility].    I appreciate the opportunity to care for you. Silvano Rusk, MD, [facility]

## 2018-10-06 NOTE — Assessment & Plan Note (Signed)
S/p cyanoacrylate injection at [facility] in late May 2020 To have a f/u EUS late June w/ possible reinjection - transportation issues may prevent  ? If we could do EUS here - we can but do not have cyanoacrylate to inject Will see what I can come up with for her

## 2018-10-06 NOTE — Assessment & Plan Note (Addendum)
Decompensated Childs A-B - right now no ascites so A 6 points but recently had ascites and would be 7 points B Need to see if she can get back on ursodiol - she cannot afford it so may not be possible Orders Placed This Encounter  Procedures  . CBC with Differential/Platelet  . Comprehensive metabolic panel

## 2018-10-07 ENCOUNTER — Ambulatory Visit (INDEPENDENT_AMBULATORY_CARE_PROVIDER_SITE_OTHER): Payer: Self-pay | Admitting: Family Medicine

## 2018-10-07 DIAGNOSIS — E039 Hypothyroidism, unspecified: Secondary | ICD-10-CM

## 2018-10-07 DIAGNOSIS — E119 Type 2 diabetes mellitus without complications: Secondary | ICD-10-CM

## 2018-10-07 DIAGNOSIS — I864 Gastric varices: Secondary | ICD-10-CM

## 2018-10-07 DIAGNOSIS — J849 Interstitial pulmonary disease, unspecified: Secondary | ICD-10-CM

## 2018-10-07 LAB — GLUCOSE, POCT (MANUAL RESULT ENTRY): POC Glucose: 164 mg/dl — AB (ref 70–99)

## 2018-10-07 MED ORDER — TRUE METRIX METER DEVI: 1.0000 [IU] | Freq: Once | 0 refills | Status: AC

## 2018-10-07 MED ORDER — METFORMIN HCL 500 MG PO TABS: 500.0000 mg | ORAL_TABLET | Freq: Two times a day (BID) | ORAL | 2 refills | Status: DC

## 2018-10-07 MED FILL — TRUE METRIX TEST STRIP: 30 days supply | Qty: 100 | Fill #1

## 2018-10-07 MED FILL — !TRUE METRIX BLOOD GLUCOSE: 1 days supply | Qty: 1 | Fill #0

## 2018-10-07 MED FILL — TRUEplus LANCETS 28G MISC: 30 days supply | Qty: 100 | Fill #1

## 2018-10-07 NOTE — Assessment & Plan Note (Signed)
Thyroid level prior to recent hospitalization was on the low side of normal.  There will be limited utility in checking this after recent illness.  Will recheck in next month, continue current dose.

## 2018-10-07 NOTE — Assessment & Plan Note (Signed)
GI is managing this, appreciate the help.  Denies any symptoms and hemoglobin is stable today.

## 2018-10-07 NOTE — Assessment & Plan Note (Signed)
Today was the last dose of her prednisone taper.  She feels her breathing is much improved which she also relates to the paracentesis.

## 2018-10-07 NOTE — Assessment & Plan Note (Signed)
Was on insulin while on steroids and inpatient.  Patient was discharged on 10 units of Lantus and metformin was held due to dye load.  Her sugars have been reasonable at home and I am worried about hypoglycemia more than hyper in this patient.  Particularly as she is finishing steroids today.  I asked her to restart her metformin at 1000 twice daily.  Kidney function was stable on recent labs.  She should stop all insulin for now particularly as she is having trouble finding supplies to check her sugars.  A1c's have been great recently.  [facility] attempting to fix her meter without success.  They will give her another meter today on a one-time basis.  She is to check her sugars fasting over the next week and we will have a virtual visit to check on how this is going next week.

## 2018-10-07 NOTE — Progress Notes (Signed)
CC: hosp fu  HPI  Hosp f/u - reviewed [facility] dc summary in detail.  Was initially admitted to [facility] long and found to have new onset ascites in the setting of known PBC and cirrhosis.  She had a tap without evidence of SBP.  She was undergoing an EGD when they found what appeared to have been a sputtering varix.  She was transferred to [facility] for further treatment including injection of the varix.  At [facility] she became hypotensive and required Midrin and ICU stay.  She was discharged to complete her course of prednisone, beta-blocker was held, close GI follow-up was recommended.  Patient saw Dr. Arelia Longest yesterday and they are working on further therapy for her varix.  She had labs obtained yesterday which I reviewed and discussed with her.  These are relatively stable.  Hemoglobin is reassuring after transfusion during that admission.  She brings the printed discharge summary for me to review.  [facility] was recommending follow-up thyroid testing as her TSH was low while sick.  It was also borderline low earlier in May.   In terms of her diabetes, hasn't checked any fasting CBGs. The CBGs she has checked are under 200.  She has 3 separate meters 1 m is broken and the other 2 she does not have strips for.  She is tried changing the battery.  Today she is fasting and we checked her sugar it was 164.  Her last dose of prednisone was this morning.  ROS: Denies CP, SOB, abdominal pain, dysuria, changes in BMs.   CC, SH/smoking status, and VS noted  Objective: BP 100/60   Pulse 84   SpO2 93%  Gen: NAD, alert, cooperative, and pleasant. HEENT: NCAT, EOMI, PERRL Abd: SNTND, BS present, no guarding or organomegaly Ext: No edema, warm Neuro: Alert and oriented, Speech clear, No gross deficits  Assessment and plan:  Gastric varices with bleeding GI is managing this, appreciate the help.  Denies any symptoms and hemoglobin is stable today.  ILD (interstitial lung disease) ([place]) Today was the last dose of  her prednisone taper.  She feels her breathing is much improved which she also relates to the paracentesis.  Controlled type 2 diabetes mellitus without complication, without long-term current use of insulin ([place]) Was on insulin while on steroids and inpatient.  Patient was discharged on 10 units of Lantus and metformin was held due to dye load.  Her sugars have been reasonable at home and I am worried about hypoglycemia more than hyper in this patient.  Particularly as she is finishing steroids today.  I asked her to restart her metformin at 1000 twice daily.  Kidney function was stable on recent labs.  She should stop all insulin for now particularly as she is having trouble finding supplies to check her sugars.  A1c's have been great recently.  [facility] attempting to fix her meter without success.  They will give her another meter today on a one-time basis.  She is to check her sugars fasting over the next week and we will have a virtual visit to check on how this is going next week.  Hypothyroidism Thyroid level prior to recent hospitalization was on the low side of normal.  There will be limited utility in checking this after recent illness.  Will recheck in next month, continue current dose.  No orders of the defined types were placed in this encounter.   Meds ordered this encounter  Medications  . metFORMIN (GLUCOPHAGE) 500 MG tablet    Sig: Take 1 tablet (500 mg total) by mouth 2 (two) times daily with a meal.    Dispense:  180 tablet    Refill:  2    [facility] indigent fund  . Blood Glucose Monitoring Suppl (TRUE METRIX METER) DEVI    Sig: 1 Units by Does not apply route once for 1 dose.    Dispense:  1 Device    Refill:  0    Ralene Ok, MD, PGY3 10/07/2018 10:28 AM

## 2018-10-07 NOTE — Patient Instructions (Addendum)
Start your metformin back. Stop the lantus.   Call me next week with your blood sugar readings over the the week, especially first thing in the morning.   We will recheck your thyroid next month.   Go to the [facility] to get a new meter right after this.

## 2018-10-13 ENCOUNTER — Telehealth: Payer: Self-pay

## 2018-10-13 NOTE — Telephone Encounter (Signed)
I am looking into and should know by next week

## 2018-10-13 NOTE — Telephone Encounter (Signed)
Holly Mueller called to touch base to see if we were going to be able to do her EUS here. She is on for [facility] to do it 11/02/2018.

## 2018-10-13 NOTE — Telephone Encounter (Signed)
Left message for patient with this information.

## 2018-10-17 ENCOUNTER — Encounter: Payer: Self-pay | Admitting: Family Medicine

## 2018-10-17 ENCOUNTER — Telehealth (INDEPENDENT_AMBULATORY_CARE_PROVIDER_SITE_OTHER): Payer: Self-pay | Admitting: Family Medicine

## 2018-10-17 ENCOUNTER — Other Ambulatory Visit: Payer: Self-pay

## 2018-10-17 DIAGNOSIS — K219 Gastro-esophageal reflux disease without esophagitis: Secondary | ICD-10-CM

## 2018-10-17 DIAGNOSIS — E119 Type 2 diabetes mellitus without complications: Secondary | ICD-10-CM

## 2018-10-17 MED ORDER — PANTOPRAZOLE SODIUM 40 MG PO TBEC: 40.0000 mg | DELAYED_RELEASE_TABLET | Freq: Two times a day (BID) | ORAL | 3 refills | Status: AC

## 2018-10-17 MED ORDER — LEVOTHYROXINE SODIUM 125 MCG PO TABS: 250.0000 ug | ORAL_TABLET | Freq: Every day | ORAL | 0 refills | Status: DC

## 2018-10-17 MED FILL — PANTOPRAZOLE SOD DR 40 MG T: 40 | 30 days supply | Qty: 60 | Fill #0

## 2018-10-17 MED FILL — LEVOTHYROXINE 125 MCG TAB: 125 | 30 days supply | Qty: 60 | Fill #0

## 2018-10-17 NOTE — Progress Notes (Signed)
[place] Telemedicine Visit  Patient consented to have virtual visit. Method of visit: Telephone  Encounter participants: Patient: Holly Mueller - located at home Provider: Ralene Ok - located at [facility] Others (if applicable): none  Chief Complaint: recheck CBGs  HPI:  After she saw me, she went to the [facility] and got meter. CBG was 200 that night and now under 200. This morning it was 167 before breakfast. Took it again 1 hr after eating and 174. She stopped the insulin like we discussed. Now she is taking 1058m BID metformin now as we discussed.   BP 114/76 and pulse 76 this morning.  ROS: per HPI  Pertinent PMHx: DM and PBC and recent admission for cirrhosis   Exam:  Respiratory: speaks a full sentence, a little short of breath but was cleaning her house   Assessment/Plan:  Controlled type 2 diabetes mellitus without complication, without long-term current use of insulin ([place] Doing well off insulin, continue metformin 10063mBID and recheck in 3 months.    Sent refills for PPI and thyroid medicine.   Time spent during visit with patient: 8 minutes

## 2018-10-17 NOTE — Assessment & Plan Note (Signed)
Doing well off insulin, continue metformin 1031m BID and recheck in 3 months.

## 2018-10-19 ENCOUNTER — Telehealth: Payer: Self-pay

## 2018-10-19 NOTE — Telephone Encounter (Signed)
Holly Mueller called to touch base as she has her [facility] appointments coming up 10/27/2018 she thinks is pre-op appointment and then the EUS on 11/02/2018. She was wondering if Dr Carlean Purl had heard anything about doing the procedures in [place] instead and she also wanted to know if he had any information of getting her ursodiol at a good price. Her friend is going [facility] for her to pick up some medicines and I told her to inquire if they have an assistance program thru them.

## 2018-10-19 NOTE — Telephone Encounter (Signed)
She can have it done here we just have not set it up yet. Dr. Rush Landmark has asked that it be scheduled but I do not think it is scheduled yet.

## 2018-10-19 NOTE — Telephone Encounter (Signed)
I called and spoke with Holly Mueller ,  that she can cancel the [facility] appointments and we will be in touch hopefully tomorrow with more information. I also said that we don't have any new information on the ursodiol assistance.

## 2018-10-24 ENCOUNTER — Encounter: Payer: Self-pay | Admitting: Internal Medicine

## 2018-10-24 ENCOUNTER — Telehealth: Payer: Self-pay | Admitting: Internal Medicine

## 2018-10-24 DIAGNOSIS — K743 Primary biliary cirrhosis: Secondary | ICD-10-CM

## 2018-10-24 HISTORY — DX: Primary biliary cirrhosis: K74.3

## 2018-10-24 MED ORDER — URSODIOL 500 MG PO TABS: 500.0000 mg | ORAL_TABLET | Freq: Three times a day (TID) | ORAL | 3 refills | Status: DC

## 2018-10-24 NOTE — Telephone Encounter (Signed)
Holly Mueller has been approved  for Medicaid and request we send in her "liver medicine" to [facility] out patient pharmacy. I told her we are aiming to have her procedures done 11/21/2018 and we will be in touch with more information about that.

## 2018-10-24 NOTE — Telephone Encounter (Signed)
I sent the Rx for ursodiol to the pharmacy  If she has any trouble getting this let us know  She is on for Dr. Rush Landmark to do EUS in July - after she has that done we will arrange for follow-up

## 2018-10-24 NOTE — Telephone Encounter (Signed)
Tried to reach patient and it says phone not in service to call back later. Funny because it worked fine earlier today. I will try later.

## 2018-10-24 NOTE — Telephone Encounter (Signed)
Pt requested a call back to discuss liver medication.

## 2018-10-25 ENCOUNTER — Other Ambulatory Visit: Payer: Self-pay

## 2018-10-25 MED ORDER — URSODIOL 500 MG PO TABS: 500.0000 mg | ORAL_TABLET | Freq: Three times a day (TID) | ORAL | 3 refills | Status: DC

## 2018-10-25 NOTE — Telephone Encounter (Signed)
Holly Mueller called in and said her phone had gone out yesterday. She said the [facility] wouldn't take her insurance card. We have resent the rx to [facility] on [address] along with a good rx card so she can get it for $26.00. She is aware that she will be getting in the mail the instructions for the upcoming EUS.

## 2018-10-26 ENCOUNTER — Other Ambulatory Visit: Payer: Self-pay

## 2018-10-26 ENCOUNTER — Telehealth: Payer: Self-pay

## 2018-10-26 DIAGNOSIS — K743 Primary biliary cirrhosis: Secondary | ICD-10-CM

## 2018-10-26 DIAGNOSIS — I864 Gastric varices: Secondary | ICD-10-CM

## 2018-10-26 MED ORDER — URSODIOL 500 MG PO TABS: 500.0000 mg | ORAL_TABLET | Freq: Three times a day (TID) | ORAL | 3 refills | Status: DC

## 2018-10-26 NOTE — Telephone Encounter (Signed)
Patient called in to let us know that the good Rx card didn't help with her ursodiol cost. It was still going to be $ 310 at [facility] even thou the web site said $26. Patient called back and said she has an orange card and she [facility]. The lady she spoke to about her medicaid said it is family planning medicaid and to get it switched over to regular medicaid she would need to be 49 or blind or disabled. She will try to investigate it further. I will resend the ursodiol.

## 2018-10-26 NOTE — Telephone Encounter (Addendum)
Pt scheduled for 11/21/2018 _0 :30am- [facility] EUS+EGD . Instructions have been mailed.

## 2018-10-27 ENCOUNTER — Telehealth: Payer: Self-pay

## 2018-10-27 MED FILL — URSODIOL 500 MG TABLET: 500 | 30 days supply | Qty: 90 | Fill #0

## 2018-10-27 NOTE — Telephone Encounter (Signed)
Tried thru [place] [facility] [place] tracks to get her ursodiol 517m tablets covered. [facility] however her medicaid is family planning so not covered. I called DLangley Gaussand left her a detailed message that I will pass this information on to Dr GCarlean Purl

## 2018-10-27 NOTE — Telephone Encounter (Signed)
Holly Mueller called me back and said that [facility] today and they will have her ursodiol medicine for her tomorrow for $10.00 with her orange card. FANTASTIC NEWS!!

## 2018-11-07 ENCOUNTER — Encounter: Payer: Self-pay | Admitting: Family Medicine

## 2018-11-07 ENCOUNTER — Ambulatory Visit (HOSPITAL_COMMUNITY)
Admission: RE | Admit: 2018-11-07 | Discharge: 2018-11-07 | Disposition: A | Discharge status: Home / Self Care | Payer: Medicaid Other | Source: Ambulatory Visit | Attending: Family Medicine | Admitting: Family Medicine

## 2018-11-07 ENCOUNTER — Ambulatory Visit (INDEPENDENT_AMBULATORY_CARE_PROVIDER_SITE_OTHER): Payer: Self-pay | Admitting: Family Medicine

## 2018-11-07 ENCOUNTER — Other Ambulatory Visit: Payer: Self-pay

## 2018-11-07 VITALS — BP 98/62 | HR 84

## 2018-11-07 DIAGNOSIS — M7989 Other specified soft tissue disorders: Secondary | ICD-10-CM

## 2018-11-07 NOTE — Progress Notes (Signed)
VASCULAR LAB PRELIMINARY  PRELIMINARY  PRELIMINARY  PRELIMINARY  Left lower extremity venous duplex completed.    Preliminary report:   See CV proc for preliminary results.   Called results to Dr. Sharlet Salina, [facility], RVT 11/07/2018, 4:30 PM

## 2018-11-07 NOTE — Progress Notes (Signed)
CC: Left leg swelling  HPI  L foot unilateral swelling - x 1 week. Does not recall an injury. Has ridden the bus recently. Occasionally it hurts along the posterior part of the medial malleolus. If she stays off of it, the swelling goes down. Swells more after walking. Hx of injury to the R foot, but not L that she recalls. No calf pain. But she did notice her calf was tight.   Additionally, she is back on ursadiol. This is due to much hard work from the GI team with Dr. Carlean Purl, we really appreciate this.   ROS: Denies CP, denies changes in SOB, abdominal pain, dysuria, changes in BMs.   CC, SH/smoking status, and VS noted  Objective: BP 98/62   Pulse 84   SpO2 94%  Gen: NAD, alert, cooperative, and pleasant. HEENT: NCAT, EOMI, PERRL Ext: L lower leg with 2+ edema, L foot edematous. L calf 10cm distal to edge of patella is 43cm, R in same location is 40cm.  Neuro: Alert and oriented, Speech clear, No gross deficits  Assessment and plan:  Left leg swelling: Difference in measurement of the left calf is concerning for possible DVT.  Patient was scheduled for stat ultrasound which will take place this afternoon.  I will call her with the results thereafter.  If no clot, we will reevaluate symptomatic treatment.  Ralene Ok, MD, PGY3 11/07/2018 2:39 PM

## 2018-11-07 NOTE — Patient Instructions (Signed)
I will call you with the ultrasound results and let you know what to do.

## 2018-11-10 ENCOUNTER — Telehealth: Payer: Self-pay | Admitting: Internal Medicine

## 2018-11-10 NOTE — Telephone Encounter (Signed)
Questions answered about COVID testing.

## 2018-11-17 ENCOUNTER — Other Ambulatory Visit (HOSPITAL_COMMUNITY)
Admission: RE | Admit: 2018-11-17 | Discharge: 2018-11-17 | Disposition: A | Discharge status: Home / Self Care | Payer: HRSA Program | Source: Ambulatory Visit | Attending: Gastroenterology | Admitting: Gastroenterology

## 2018-11-17 DIAGNOSIS — Z01812 Encounter for preprocedural laboratory examination: Secondary | ICD-10-CM | POA: Insufficient documentation

## 2018-11-17 DIAGNOSIS — Z1159 Encounter for screening for other viral diseases: Secondary | ICD-10-CM | POA: Diagnosis not present

## 2018-11-18 ENCOUNTER — Other Ambulatory Visit: Payer: Self-pay

## 2018-11-18 ENCOUNTER — Encounter (HOSPITAL_COMMUNITY): Payer: Self-pay | Admitting: *Deleted

## 2018-11-18 LAB — SARS CORONAVIRUS 2 (TAT 6-24 HRS): SARS Coronavirus 2: NEGATIVE

## 2018-11-18 NOTE — Progress Notes (Signed)
Pt denies SOB, chest pain, and being under the care of a cardiologist. Pt denies having a cardiac cath. Pt denies recent labs. Pt made aware to stop taking  vitamins, fish oil and herbal medications. Do not take any NSAIDs ie: Ibuprofen, Advil, Naproxen (Aleve), Motrin, BC and Goody Powder. Pt made aware to not take Metformin on DOS. Pt stated  That she does not check her blood glucose. Pt denies that she and family members tested positive fore COVID-19 ( pt tested on 11/17/18 and reminded to quarantine ).  Coronavirus Screening  Pt denies that she and members of household experienced the following symptoms:  Cough yes/no: No Fever (>100.77F)  yes/no: No Runny nose yes/no: No Sore throat yes/no: No Difficulty breathing/shortness of breath  yes/no: No  Have you or a family member traveled in the last 14 days and where? yes/no: No  Pt reminded that hospital visitation restrictions are in effect and the importance of the restrictions.   Pt verbalized understanding of all pre-op instructions.

## 2018-11-19 ENCOUNTER — Encounter (HOSPITAL_COMMUNITY): Payer: Self-pay | Admitting: Registered Nurse

## 2018-11-20 ENCOUNTER — Other Ambulatory Visit: Payer: Self-pay

## 2018-11-20 ENCOUNTER — Encounter (HOSPITAL_COMMUNITY): Payer: Self-pay | Admitting: Certified Registered Nurse Anesthetist

## 2018-11-20 ENCOUNTER — Encounter (HOSPITAL_COMMUNITY): Payer: Self-pay | Admitting: Emergency Medicine

## 2018-11-20 ENCOUNTER — Emergency Department (HOSPITAL_COMMUNITY): Payer: Medicaid Other

## 2018-11-20 ENCOUNTER — Inpatient Hospital Stay (HOSPITAL_COMMUNITY)
Admission: EM | Admit: 2018-11-20 | Discharge: 2018-11-24 | DRG: 194 | Disposition: A | Discharge status: Home / Self Care | Payer: Medicaid Other | Attending: Family Medicine | Admitting: Family Medicine

## 2018-11-20 DIAGNOSIS — Z7951 Long term (current) use of inhaled steroids: Secondary | ICD-10-CM

## 2018-11-20 DIAGNOSIS — Z79899 Other long term (current) drug therapy: Secondary | ICD-10-CM

## 2018-11-20 DIAGNOSIS — Z87891 Personal history of nicotine dependence: Secondary | ICD-10-CM

## 2018-11-20 DIAGNOSIS — Z79890 Hormone replacement therapy (postmenopausal): Secondary | ICD-10-CM

## 2018-11-20 DIAGNOSIS — E119 Type 2 diabetes mellitus without complications: Secondary | ICD-10-CM

## 2018-11-20 DIAGNOSIS — Y95 Nosocomial condition: Secondary | ICD-10-CM | POA: Diagnosis present

## 2018-11-20 DIAGNOSIS — E876 Hypokalemia: Secondary | ICD-10-CM | POA: Diagnosis not present

## 2018-11-20 DIAGNOSIS — I864 Gastric varices: Secondary | ICD-10-CM | POA: Diagnosis present

## 2018-11-20 DIAGNOSIS — D61818 Other pancytopenia: Secondary | ICD-10-CM | POA: Diagnosis present

## 2018-11-20 DIAGNOSIS — E669 Obesity, unspecified: Secondary | ICD-10-CM | POA: Diagnosis present

## 2018-11-20 DIAGNOSIS — Z20828 Contact with and (suspected) exposure to other viral communicable diseases: Secondary | ICD-10-CM | POA: Diagnosis present

## 2018-11-20 DIAGNOSIS — Z833 Family history of diabetes mellitus: Secondary | ICD-10-CM

## 2018-11-20 DIAGNOSIS — R053 Chronic cough: Secondary | ICD-10-CM | POA: Diagnosis present

## 2018-11-20 DIAGNOSIS — Z597 Insufficient social insurance and welfare support: Secondary | ICD-10-CM

## 2018-11-20 DIAGNOSIS — K219 Gastro-esophageal reflux disease without esophagitis: Secondary | ICD-10-CM | POA: Diagnosis present

## 2018-11-20 DIAGNOSIS — E039 Hypothyroidism, unspecified: Secondary | ICD-10-CM | POA: Diagnosis present

## 2018-11-20 DIAGNOSIS — J849 Interstitial pulmonary disease, unspecified: Secondary | ICD-10-CM | POA: Diagnosis present

## 2018-11-20 DIAGNOSIS — K743 Primary biliary cirrhosis: Secondary | ICD-10-CM | POA: Diagnosis present

## 2018-11-20 DIAGNOSIS — N189 Chronic kidney disease, unspecified: Secondary | ICD-10-CM | POA: Diagnosis present

## 2018-11-20 DIAGNOSIS — Z7984 Long term (current) use of oral hypoglycemic drugs: Secondary | ICD-10-CM

## 2018-11-20 DIAGNOSIS — R509 Fever, unspecified: Secondary | ICD-10-CM

## 2018-11-20 DIAGNOSIS — Z8379 Family history of other diseases of the digestive system: Secondary | ICD-10-CM

## 2018-11-20 DIAGNOSIS — R05 Cough: Secondary | ICD-10-CM | POA: Diagnosis present

## 2018-11-20 DIAGNOSIS — Z886 Allergy status to analgesic agent status: Secondary | ICD-10-CM

## 2018-11-20 DIAGNOSIS — Z8249 Family history of ischemic heart disease and other diseases of the circulatory system: Secondary | ICD-10-CM

## 2018-11-20 DIAGNOSIS — M160 Bilateral primary osteoarthritis of hip: Secondary | ICD-10-CM | POA: Diagnosis present

## 2018-11-20 DIAGNOSIS — I77811 Abdominal aortic ectasia: Secondary | ICD-10-CM | POA: Diagnosis present

## 2018-11-20 DIAGNOSIS — K2950 Unspecified chronic gastritis without bleeding: Secondary | ICD-10-CM | POA: Diagnosis present

## 2018-11-20 DIAGNOSIS — E440 Moderate protein-calorie malnutrition: Secondary | ICD-10-CM | POA: Diagnosis present

## 2018-11-20 DIAGNOSIS — R161 Splenomegaly, not elsewhere classified: Secondary | ICD-10-CM | POA: Diagnosis present

## 2018-11-20 DIAGNOSIS — E1122 Type 2 diabetes mellitus with diabetic chronic kidney disease: Secondary | ICD-10-CM | POA: Diagnosis present

## 2018-11-20 DIAGNOSIS — Z6837 Body mass index (BMI) 37.0-37.9, adult: Secondary | ICD-10-CM

## 2018-11-20 DIAGNOSIS — E872 Acidosis: Secondary | ICD-10-CM | POA: Diagnosis present

## 2018-11-20 DIAGNOSIS — R059 Cough, unspecified: Secondary | ICD-10-CM

## 2018-11-20 DIAGNOSIS — J302 Other seasonal allergic rhinitis: Secondary | ICD-10-CM | POA: Diagnosis present

## 2018-11-20 DIAGNOSIS — I728 Aneurysm of other specified arteries: Secondary | ICD-10-CM | POA: Diagnosis present

## 2018-11-20 DIAGNOSIS — Z801 Family history of malignant neoplasm of trachea, bronchus and lung: Secondary | ICD-10-CM

## 2018-11-20 DIAGNOSIS — J189 Pneumonia, unspecified organism: Principal | ICD-10-CM | POA: Diagnosis present

## 2018-11-20 DIAGNOSIS — Z560 Unemployment, unspecified: Secondary | ICD-10-CM

## 2018-11-20 DIAGNOSIS — K148 Other diseases of tongue: Secondary | ICD-10-CM | POA: Diagnosis present

## 2018-11-20 DIAGNOSIS — R1110 Vomiting, unspecified: Secondary | ICD-10-CM

## 2018-11-20 LAB — CBC WITH DIFFERENTIAL/PLATELET
Abs Immature Granulocytes: 0.03 10*3/uL (ref 0.00–0.07)
Basophils Absolute: 0 10*3/uL (ref 0.0–0.1)
Basophils Relative: 1 %
Eosinophils Absolute: 0 10*3/uL (ref 0.0–0.5)
Eosinophils Relative: 2 %
HCT: 36.8 % (ref 36.0–46.0)
Hemoglobin: 12.1 g/dL (ref 12.0–15.0)
Immature Granulocytes: 2 %
Lymphocytes Relative: 10 %
Lymphs Abs: 0.2 10*3/uL — ABNORMAL LOW (ref 0.7–4.0)
MCH: 28.5 pg (ref 26.0–34.0)
MCHC: 32.9 g/dL (ref 30.0–36.0)
MCV: 86.6 fL (ref 80.0–100.0)
Monocytes Absolute: 0.2 10*3/uL (ref 0.1–1.0)
Monocytes Relative: 10 %
Neutro Abs: 1.3 10*3/uL — ABNORMAL LOW (ref 1.7–7.7)
Neutrophils Relative %: 75 %
Platelets: 59 10*3/uL — ABNORMAL LOW (ref 150–400)
RBC: 4.25 MIL/uL (ref 3.87–5.11)
RDW: 14.6 % (ref 11.5–15.5)
WBC: 1.8 10*3/uL — ABNORMAL LOW (ref 4.0–10.5)
nRBC: 0 % (ref 0.0–0.2)

## 2018-11-20 LAB — COMPREHENSIVE METABOLIC PANEL
ALT: 30 U/L (ref 0–44)
AST: 58 U/L — ABNORMAL HIGH (ref 15–41)
Albumin: 2.7 g/dL — ABNORMAL LOW (ref 3.5–5.0)
Alkaline Phosphatase: 115 U/L (ref 38–126)
Anion gap: 7 (ref 5–15)
BUN: <5 mg/dL — ABNORMAL LOW (ref 6–20)
CO2: 21 mmol/L — ABNORMAL LOW (ref 22–32)
Calcium: 8.6 mg/dL — ABNORMAL LOW (ref 8.9–10.3)
Chloride: 111 mmol/L (ref 98–111)
Creatinine, Ser: 0.6 mg/dL (ref 0.44–1.00)
GFR calc Af Amer: >60 mL/min (ref >60)
GFR calc non Af Amer: >60 mL/min (ref >60)
Glucose, Bld: 166 mg/dL — ABNORMAL HIGH (ref 70–99)
Potassium: 3.6 mmol/L (ref 3.5–5.1)
Sodium: 139 mmol/L (ref 135–145)
Total Bilirubin: 2.5 mg/dL — ABNORMAL HIGH (ref 0.3–1.2)
Total Protein: 7.3 g/dL (ref 6.5–8.1)

## 2018-11-20 LAB — LACTIC ACID, PLASMA: Lactic Acid, Venous: 2.6 mmol/L (ref 0.5–1.9)

## 2018-11-20 LAB — LIPASE, BLOOD: Lipase: 46 U/L (ref 11–51)

## 2018-11-20 MED ORDER — SODIUM CHLORIDE 0.9 % IV BOLUS
1000.0000 mL | Freq: Once | INTRAVENOUS | Status: AC
  Administered 2018-11-20: 1000 mL via INTRAVENOUS

## 2018-11-20 NOTE — ED Triage Notes (Signed)
Pt presents to ED from home by [facility]. Pt c/o of weakness, SOB, fever x2d. Emesis x6 today. Pt reports being immunocompromised. NS and zofran given by EMS.

## 2018-11-20 NOTE — ED Provider Notes (Signed)
[facility] EMERGENCY DEPARTMENT Provider Note   CSN: [id number] Arrival date & time: 11/20/18  2207     History   Chief Complaint Chief Complaint  Patient presents with  . Weakness  . Fever    HPI Rianna Lukes is a 53 y.o. female.     The history is provided by the patient and medical records.  Weakness Associated symptoms: cough, diarrhea, fever, nausea, shortness of breath and vomiting   Fever Associated symptoms: cough, diarrhea, nausea and vomiting      53 year old female with history of arthritis, chronic kidney disease, diabetes, gastric varices, GERD, cirrhosis, presenting to the ED with generalized weakness.  States she felt okay this morning but throughout the day she has progressively started feeling worse.  She reports multiple episodes of nonbloody, nonbilious emesis, watery diarrhea, fever, and shortness of breath.  Reports she has been coughing, sometimes this make vomiting worse.  She denies any chest pain.  She is currently living with her sister in [place] as she has an EGD scheduled for in the morning for recheck of her varices.  She did receive some Zofran with EMS which seemed to help somewhat but got nauseated again when moving around here.  Patient did have COVID test done on 11/17/18 that was negative. This is her 3rd negative test thus far.  Past Medical History:  Diagnosis Date  . Acute respiratory failure with hypoxia ([place]) 09/22/2018  . Arthritis    bursitis left hip flares-not an issue  . Chronic kidney disease   . Diabetes mellitus without complication ([place])   . Edentulous    10-19-13 at present  . Epilepsy ([place]) in 1972   No seizures since 1972. Previously treated with phenobarbital.   . Gastric varices with bleeding   . GERD (gastroesophageal reflux disease) 2008  . Headache(784.0)    hx of migraines   . 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 Hypothyroidism 2008  . Lower esophageal ring  08/18/2013  . Pneumonia   . Primary biliary cholangitis ([place]) 10/24/2018  . Seasonal allergies 2003  . Shortness of breath   . Wears glasses     Patient Active Problem List   Diagnosis Date Noted  . Primary biliary cholangitis ([place]) 10/24/2018  . Gastric varices with bleeding   . Anemia 09/22/2018  . Palpitations 09/13/2018  . Hemoptysis 08/28/2018  . Pancytopenia ([place]) 08/28/2018  . Uninsured 04/04/2018  . ILD (interstitial lung disease) ([place]) 03/23/2018  . Food insecurity 10/15/2017  . Hyperglycemia 10/06/2017  . Ectatic aorta ([place]) 09/11/2017  . Left leg pain   . Controlled type 2 diabetes mellitus without complication, without long-term current use of insulin ([place])   . 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 Bicytopenia 10/24/2016  . GERD (gastroesophageal reflux disease) 10/23/2016  . Cough 12/09/2015  . Chronic pain of left thumb 10/09/2015  . Precordial chest pain 08/03/2015  . Bursitis of left hip 09/14/2013  . DOE (dyspnea on exertion) 08/17/2013  . Ganglion cyst of wrist 08/24/2012  . Right hip pain 08/23/2012  . Umbilical hernia 40/98/1191  . Healthcare maintenance 02/14/2012  . Osteoarthritis 02/12/2012  . Allergic rhinitis 08/26/2011  . Obesity 07/08/2010  . SEIZURES, HX OF 12/12/2009  . DENTAL CARIES 12/18/2008  . DEPRESSION 07/26/2008  . MIGRAINE HEADACHE 11/15/2007  . Hypothyroidism 10/12/2006  . Restrictive lung disease 07/30/2006    Past Surgical History:  Procedure Laterality Date  . ABDOMINAL HYSTERECTOMY     "  partial "  . BALLOON DILATION N/A 10/24/2013   Procedure: BALLOON DILATION;  Surgeon: Gatha Mayer, MD;  Location: [facility];  Service: Endoscopy;  Laterality: N/A;  . BIOPSY  09/26/2018   Procedure: BIOPSY;  Surgeon: Thornton Park, MD;  Location: [place];  Service: Gastroenterology;;  . CESAREAN SECTION     x2  . DENTAL SURGERY     multiple extractions 3'15  . DILATION AND CURETTAGE OF  UTERUS    . ESOPHAGOGASTRODUODENOSCOPY N/A 10/24/2013   Procedure: ESOPHAGOGASTRODUODENOSCOPY (EGD);  Surgeon: Gatha Mayer, MD;  Location: [facility];  Service: Endoscopy;  Laterality: N/A;  . ESOPHAGOGASTRODUODENOSCOPY (EGD) WITH PROPOFOL N/A 09/26/2018   Procedure: ESOPHAGOGASTRODUODENOSCOPY (EGD) WITH PROPOFOL;  Surgeon: Thornton Park, MD;  Location: [place];  Service: Gastroenterology;  Laterality: N/A;  . IR PARACENTESIS  09/23/2018  . LIVER BIOPSY  06/30/2012   Procedure: LIVER BIOPSY;  Surgeon: Shann Medal, MD;  Location: [facility];  Service: General;;  . NOVASURE ABLATION    . SHOULDER ARTHROSCOPY Right 2011  . UMBILICAL HERNIA REPAIR N/A 06/30/2012   Procedure: remove umbilicus;  Surgeon: Shann Medal, MD;  Location: [facility];  Service: General;  Laterality: N/A;  . VENTRAL HERNIA REPAIR N/A 06/30/2012   Procedure: LAPAROSCOPIC VENTRAL HERNIA;  Surgeon: Shann Medal, MD;  Location: [facility];  Service: General;  Laterality: N/A;  With Mesh  . VIDEO BRONCHOSCOPY Bilateral 07/20/2018   Procedure: VIDEO BRONCHOSCOPY WITHOUT FLUORO;  Surgeon: Brand Males, MD;  Location: [facility];  Service: Cardiopulmonary;  Laterality: Bilateral;  . WISDOM TOOTH EXTRACTION       OB History   No obstetric history on file.      Home Medications    Prior to Admission medications   Medication Sig Start Date End Date Taking? Authorizing Provider  albuterol (PROAIR HFA) 108 (90 Base) MCG/ACT inhaler INHALE 2 PUFFS INTO THE LUNGS EVERY 6 HOURS AS NEEDED FOR SHORTNESS OF BREATH Patient taking differently: Inhale 2 puffs into the lungs every 4 (four) hours as needed for wheezing or shortness of breath.  04/25/18   Rory Percy, DO  albuterol (PROVENTIL) (2.5 MG/3ML) 0.083% nebulizer solution Take 3 mLs (2.5 mg total) by nebulization every 6 (six) hours as needed for wheezing or shortness of breath. 04/25/18   Rory Percy, DO  blood glucose meter kit and supplies KIT Dispense based  on patient and insurance preference. Use up to four times daily as directed. (FOR ICD-9 250.00, 250.01). 06/19/17   Rory Percy, DO  blood glucose meter kit and supplies Dispense based on patient and insurance preference. Use up to four times daily as directed. (FOR ICD-10 E10.9, E11.9). 09/02/18   Dessa Phi, DO  cetirizine (ZYRTEC) 10 MG tablet Take 1 tablet (10 mg total) by mouth daily. 01/27/17   [name], MD  fluticasone (FLONASE) 50 MCG/ACT nasal spray Place 2 sprays into both nostrils daily.    [provider]  glucose blood (TRUE METRIX BLOOD GLUCOSE TEST) test strip Use as instructed 09/16/18   [name], MD  insulin glargine (LANTUS) 100 UNIT/ML injection Inject 0.25 mLs (25 Units total) into the skin at bedtime. Patient not taking: Reported on 11/11/2018 09/02/18   Dessa Phi, DO  levothyroxine (SYNTHROID) 125 MCG tablet Take 2 tablets (250 mcg total) by mouth daily. 10/17/18   [name], MD  metFORMIN (GLUCOPHAGE) 500 MG tablet Take 1 tablet (500 mg total) by mouth 2 (two) times daily with a meal. Patient taking differently: Take 1,000  mg by mouth 2 (two) times daily with a meal.  10/07/18   [name], MD  Mometasone Furoate [facility] HFA) 200 MCG/ACT AERO Inhale 2 puffs into the lungs 2 (two) times daily. 05/25/18   Brand Males, MD  pantoprazole (PROTONIX) 40 MG tablet Take 1 tablet (40 mg total) by mouth 2 (two) times daily before a meal. 10/17/18   [name], MD  TRUEplus Lancets 28G MISC Use to check blood sugar as directed. 09/16/18   [name], MD  ursodiol (ACTIGALL) 500 MG tablet Take 1 tablet (500 mg total) by mouth 3 (three) times daily. 10/26/18   Gatha Mayer, MD    Family History Family History  Problem Relation Age of Onset  . Hypertension Mother   . Diabetes Mother   . Cirrhosis Mother        ? medications  . Lung cancer Father   . Stroke Father   . Diabetes Sister   . Hypertension Sister   .  Colon cancer Neg Hx   . Esophageal cancer Neg Hx   . Rectal cancer Neg Hx     Social History Social History   Tobacco Use  . Smoking status: Former Smoker    Packs/day: 0.25    Types: Cigarettes    Quit date: 1997    Years since quitting: 23.5  . Smokeless tobacco: Never Used  . Tobacco comment: social  Substance Use Topics  . Alcohol use: Not Currently  . Drug use: No     Allergies   Aspirin   Review of Systems Review of Systems  Constitutional: Positive for fever.  Respiratory: Positive for cough and shortness of breath.   Gastrointestinal: Positive for diarrhea, nausea and vomiting.  Neurological: Positive for weakness.  All other systems reviewed and are negative.    Physical Exam Updated Vital Signs BP 128/80   Pulse (!) 103   Temp 99.3 F (37.4 C) (Oral)   Resp (!) 21   Ht _0  (1.676 m)   Wt 106.6 kg   LMP  (LMP Unknown)   SpO2 95%   BMI 37.93 kg/m   Physical Exam Vitals signs and nursing note reviewed.  Constitutional:      Appearance: She is well-developed.  HENT:     Head: Normocephalic and atraumatic.  Eyes:     Conjunctiva/sclera: Conjunctivae normal.     Pupils: Pupils are equal, round, and reactive to light.  Neck:     Musculoskeletal: Normal range of motion.  Cardiovascular:     Rate and Rhythm: Regular rhythm. Tachycardia present.     Heart sounds: Normal heart sounds.  Pulmonary:     Effort: Pulmonary effort is normal. Tachypnea present.     Breath sounds: Normal breath sounds.     Comments: Tachypneic, intermittently coughing, appears winded but lungs mostly clear Abdominal:     General: Bowel sounds are normal.     Palpations: Abdomen is soft.     Tenderness: There is no abdominal tenderness. There is no guarding or rebound.     Comments: Soft, nontender, normal bowel sounds  Musculoskeletal: Normal range of motion.  Skin:    General: Skin is warm and dry.  Neurological:     Mental Status: She is alert and oriented to  person, place, and time.      ED Treatments / Results  Labs (all labs ordered are listed, but only abnormal results are displayed) Labs Reviewed  CBC WITH DIFFERENTIAL/PLATELET - Abnormal; Notable for the following components:  Result Value   WBC 1.8 (*)    Platelets 59 (*)    Neutro Abs 1.3 (*)    Lymphs Abs 0.2 (*)    All other components within normal limits  COMPREHENSIVE METABOLIC PANEL - Abnormal; Notable for the following components:   CO2 21 (*)    Glucose, Bld 166 (*)    BUN <5 (*)    Calcium 8.6 (*)    Albumin 2.7 (*)    AST 58 (*)    Total Bilirubin 2.5 (*)    All other components within normal limits  LACTIC ACID, PLASMA - Abnormal; Notable for the following components:   Lactic Acid, Venous 2.6 (*)    All other components within normal limits  URINALYSIS, ROUTINE W REFLEX MICROSCOPIC - Abnormal; Notable for the following components:   Color, Urine AMBER (*)    All other components within normal limits  SARS CORONAVIRUS 2 (HOSPITAL ORDER, [place])  URINE CULTURE  CULTURE, BLOOD (ROUTINE X 2)  CULTURE, BLOOD (ROUTINE X 2)  LIPASE, BLOOD    EKG None  Radiology Dg Chest Port 1 View  Result Date: 11/20/2018 CLINICAL DATA:  Shortness of breath with vomiting EXAM: PORTABLE CHEST 1 VIEW COMPARISON:  09/22/2018, 08/27/2018 FINDINGS: Low lung volumes. Streaky atelectasis or minimal infiltrate at the left base. Stable cardiomediastinal silhouette. No pneumothorax. Mild chronic interstitial lung disease. IMPRESSION: Low lung volumes. Streaky atelectasis or minimal infiltrate at the left base. Electronically Signed   By: Donavan Foil M.D.   On: 11/20/2018 23:15    Procedures Procedures (including critical care time)  Medications Ordered in ED Medications  sodium chloride 0.9 % bolus 1,000 mL (0 mLs Intravenous Stopped 11/21/18 0000)  acetaminophen (TYLENOL) tablet 1,000 mg (1,000 mg Oral Given 11/21/18 0057)  ceFEPIme (MAXIPIME) 2  g in sodium chloride 0.9 % 100 mL IVPB (0 g Intravenous Stopped 11/21/18 0145)  vancomycin (VANCOCIN) 2,000 mg in sodium chloride 0.9 % 500 mL IVPB (0 mg Intravenous Stopped 11/21/18 0300)     Initial Impression / Assessment and Plan / ED Course  I have reviewed the triage vital signs and the nursing notes.  Pertinent labs & imaging results that were available during my care of the patient were reviewed by me and considered in my medical decision making (see chart for details).  53 y.o. F here with fever, cough, and vomiting.  Fever for the past 2 days and multiple episodes of nonbloody, nonbilious emesis today.  She has had some diarrhea as well.  Denies any sick contacts or known COVID exposures.  She did have negative COVID screen 3 days ago in anticipation for EGD tomorrow.  She is febrile here, tachycardic, and tachypneic.  Her lungs are mostly clear but she does seem somewhat winded during conversation.  Plan for lactate, screening labs, UA with culture and blood cultures.  Chest x-ray pending.  She was given IV fluids and Tylenol for fever.  12:33 AM Patient rechecked.  She is becoming more tachypneic and tachycardic here with a brief desaturation down to 89%.  Chest x-ray concerning for possible infiltrate, patient does report she has been coughing.  Labs reviewed, does have elevated lactate at 2.6, she has received IV fluids.  She does have a leukopenia which appears chronic, likely due to her chronic liver disease.  As patient was admitted in May, will expand antibiotic coverage to Vanc and cefepime.  She did have a recent COVID test 3 days ago that was  negative, however as she will require admission we will retest her today.  Discussed with family practice, they have evaluated in the ED and will admit.  Final Clinical Impressions(s) / ED Diagnoses   Final diagnoses:  Fever in adult  Cough  Vomiting and diarrhea    ED Discharge Orders    None       Larene Pickett, PA-C  11/21/18 9802    Blanchie Dessert, MD 11/29/18 0020

## 2018-11-21 ENCOUNTER — Ambulatory Visit (HOSPITAL_COMMUNITY): Admission: RE | Admit: 2018-11-21 | Payer: Medicaid Other | Source: Home / Self Care | Admitting: Gastroenterology

## 2018-11-21 ENCOUNTER — Encounter (HOSPITAL_COMMUNITY)
Admission: EM | Disposition: A | Discharge status: Home / Self Care | Payer: Self-pay | Source: Home / Self Care | Attending: Family Medicine

## 2018-11-21 DIAGNOSIS — K219 Gastro-esophageal reflux disease without esophagitis: Secondary | ICD-10-CM | POA: Diagnosis present

## 2018-11-21 DIAGNOSIS — M160 Bilateral primary osteoarthritis of hip: Secondary | ICD-10-CM | POA: Diagnosis present

## 2018-11-21 DIAGNOSIS — K743 Primary biliary cirrhosis: Secondary | ICD-10-CM | POA: Diagnosis present

## 2018-11-21 DIAGNOSIS — E669 Obesity, unspecified: Secondary | ICD-10-CM | POA: Diagnosis present

## 2018-11-21 DIAGNOSIS — E876 Hypokalemia: Secondary | ICD-10-CM | POA: Diagnosis not present

## 2018-11-21 DIAGNOSIS — Z20828 Contact with and (suspected) exposure to other viral communicable diseases: Secondary | ICD-10-CM | POA: Diagnosis present

## 2018-11-21 DIAGNOSIS — J189 Pneumonia, unspecified organism: Secondary | ICD-10-CM | POA: Diagnosis present

## 2018-11-21 DIAGNOSIS — R05 Cough: Secondary | ICD-10-CM

## 2018-11-21 DIAGNOSIS — R1110 Vomiting, unspecified: Secondary | ICD-10-CM

## 2018-11-21 DIAGNOSIS — I77811 Abdominal aortic ectasia: Secondary | ICD-10-CM | POA: Diagnosis present

## 2018-11-21 DIAGNOSIS — R161 Splenomegaly, not elsewhere classified: Secondary | ICD-10-CM | POA: Diagnosis present

## 2018-11-21 DIAGNOSIS — J849 Interstitial pulmonary disease, unspecified: Secondary | ICD-10-CM

## 2018-11-21 DIAGNOSIS — N189 Chronic kidney disease, unspecified: Secondary | ICD-10-CM | POA: Diagnosis present

## 2018-11-21 DIAGNOSIS — J302 Other seasonal allergic rhinitis: Secondary | ICD-10-CM | POA: Diagnosis present

## 2018-11-21 DIAGNOSIS — E1122 Type 2 diabetes mellitus with diabetic chronic kidney disease: Secondary | ICD-10-CM | POA: Diagnosis present

## 2018-11-21 DIAGNOSIS — E872 Acidosis: Secondary | ICD-10-CM | POA: Diagnosis present

## 2018-11-21 DIAGNOSIS — I864 Gastric varices: Secondary | ICD-10-CM | POA: Diagnosis present

## 2018-11-21 DIAGNOSIS — Y95 Nosocomial condition: Secondary | ICD-10-CM | POA: Diagnosis present

## 2018-11-21 DIAGNOSIS — K148 Other diseases of tongue: Secondary | ICD-10-CM | POA: Diagnosis present

## 2018-11-21 DIAGNOSIS — Z597 Insufficient social insurance and welfare support: Secondary | ICD-10-CM | POA: Diagnosis not present

## 2018-11-21 DIAGNOSIS — Z6837 Body mass index (BMI) 37.0-37.9, adult: Secondary | ICD-10-CM | POA: Diagnosis not present

## 2018-11-21 DIAGNOSIS — E039 Hypothyroidism, unspecified: Secondary | ICD-10-CM | POA: Diagnosis present

## 2018-11-21 DIAGNOSIS — K2950 Unspecified chronic gastritis without bleeding: Secondary | ICD-10-CM | POA: Diagnosis present

## 2018-11-21 DIAGNOSIS — E119 Type 2 diabetes mellitus without complications: Secondary | ICD-10-CM

## 2018-11-21 DIAGNOSIS — E440 Moderate protein-calorie malnutrition: Secondary | ICD-10-CM | POA: Diagnosis present

## 2018-11-21 DIAGNOSIS — D61818 Other pancytopenia: Secondary | ICD-10-CM | POA: Diagnosis present

## 2018-11-21 DIAGNOSIS — I728 Aneurysm of other specified arteries: Secondary | ICD-10-CM | POA: Diagnosis present

## 2018-11-21 DIAGNOSIS — R197 Diarrhea, unspecified: Secondary | ICD-10-CM

## 2018-11-21 DIAGNOSIS — Z87891 Personal history of nicotine dependence: Secondary | ICD-10-CM | POA: Diagnosis not present

## 2018-11-21 HISTORY — DX: Chronic kidney disease, unspecified: N18.9

## 2018-11-21 HISTORY — DX: Presence of spectacles and contact lenses: Z97.3

## 2018-11-21 LAB — COMPREHENSIVE METABOLIC PANEL
ALT: 28 U/L (ref 0–44)
AST: 51 U/L — ABNORMAL HIGH (ref 15–41)
Albumin: 2.5 g/dL — ABNORMAL LOW (ref 3.5–5.0)
Alkaline Phosphatase: 93 U/L (ref 38–126)
Anion gap: 7 (ref 5–15)
BUN: <5 mg/dL — ABNORMAL LOW (ref 6–20)
CO2: 19 mmol/L — ABNORMAL LOW (ref 22–32)
Calcium: 8.2 mg/dL — ABNORMAL LOW (ref 8.9–10.3)
Chloride: 112 mmol/L — ABNORMAL HIGH (ref 98–111)
Creatinine, Ser: 0.54 mg/dL (ref 0.44–1.00)
GFR calc Af Amer: >60 mL/min (ref >60)
GFR calc non Af Amer: >60 mL/min (ref >60)
Glucose, Bld: 95 mg/dL (ref 70–99)
Potassium: 3.4 mmol/L — ABNORMAL LOW (ref 3.5–5.1)
Sodium: 138 mmol/L (ref 135–145)
Total Bilirubin: 2.6 mg/dL — ABNORMAL HIGH (ref 0.3–1.2)
Total Protein: 6.6 g/dL (ref 6.5–8.1)

## 2018-11-21 LAB — BLOOD CULTURE ID PANEL (REFLEXED)

## 2018-11-21 LAB — CBC WITH DIFFERENTIAL/PLATELET
Abs Immature Granulocytes: 0.02 10*3/uL (ref 0.00–0.07)
Basophils Absolute: 0 10*3/uL (ref 0.0–0.1)
Basophils Relative: 2 %
Eosinophils Absolute: 0 10*3/uL (ref 0.0–0.5)
Eosinophils Relative: 1 %
HCT: 33.8 % — ABNORMAL LOW (ref 36.0–46.0)
Hemoglobin: 10.9 g/dL — ABNORMAL LOW (ref 12.0–15.0)
Immature Granulocytes: 2 %
Lymphocytes Relative: 17 %
Lymphs Abs: 0.2 10*3/uL — ABNORMAL LOW (ref 0.7–4.0)
MCH: 28.1 pg (ref 26.0–34.0)
MCHC: 32.2 g/dL (ref 30.0–36.0)
MCV: 87.1 fL (ref 80.0–100.0)
Monocytes Absolute: 0.2 10*3/uL (ref 0.1–1.0)
Monocytes Relative: 15 %
Neutro Abs: 0.9 10*3/uL — ABNORMAL LOW (ref 1.7–7.7)
Neutrophils Relative %: 63 %
Platelets: 58 10*3/uL — ABNORMAL LOW (ref 150–400)
RBC: 3.88 MIL/uL (ref 3.87–5.11)
RDW: 14.6 % (ref 11.5–15.5)
WBC: 1.3 10*3/uL — CL (ref 4.0–10.5)
nRBC: 0 % (ref 0.0–0.2)

## 2018-11-21 LAB — MRSA PCR SCREENING: MRSA by PCR: NEGATIVE

## 2018-11-21 LAB — URINE CULTURE

## 2018-11-21 LAB — URINALYSIS, ROUTINE W REFLEX MICROSCOPIC
Bilirubin Urine: NEGATIVE
Glucose, UA: NEGATIVE mg/dL
Hgb urine dipstick: NEGATIVE
Ketones, ur: NEGATIVE mg/dL
Leukocytes,Ua: NEGATIVE
Nitrite: NEGATIVE
Protein, ur: NEGATIVE mg/dL
Specific Gravity, Urine: 1.019 (ref 1.005–1.030)
pH: 7 (ref 5.0–8.0)

## 2018-11-21 LAB — CK: Total CK: 48 U/L (ref 38–234)

## 2018-11-21 LAB — TSH: TSH: 0.031 u[IU]/mL — ABNORMAL LOW (ref 0.350–4.500)

## 2018-11-21 LAB — SARS CORONAVIRUS 2 BY RT PCR (HOSPITAL ORDER, PERFORMED IN ~~LOC~~ HOSPITAL LAB): SARS Coronavirus 2: NEGATIVE

## 2018-11-21 LAB — AMMONIA: Ammonia: 43 umol/L — ABNORMAL HIGH (ref 9–35)

## 2018-11-21 LAB — LACTIC ACID, PLASMA: Lactic Acid, Venous: 1.4 mmol/L (ref 0.5–1.9)

## 2018-11-21 LAB — GLUCOSE, CAPILLARY: Glucose-Capillary: 83 mg/dL (ref 70–99)

## 2018-11-21 SURGERY — CANCELLED PROCEDURE

## 2018-11-21 MED ORDER — ALBUTEROL SULFATE (2.5 MG/3ML) 0.083% IN NEBU: 2.5000 mg | INHALATION_SOLUTION | RESPIRATORY_TRACT | Status: DC | PRN

## 2018-11-21 MED ORDER — FLUTICASONE PROPIONATE 50 MCG/ACT NA SUSP
2.0000 | Freq: Every day | NASAL | Status: DC
  Administered 2018-11-21 – 2018-11-24 (×4): 2 via NASAL
  Filled 2018-11-21: qty 16

## 2018-11-21 MED ORDER — URSODIOL 300 MG PO CAPS
600.0000 mg | ORAL_CAPSULE | Freq: Two times a day (BID) | ORAL | Status: DC
  Administered 2018-11-21 – 2018-11-24 (×7): 600 mg via ORAL
  Filled 2018-11-21 (×9): qty 2

## 2018-11-21 MED ORDER — POTASSIUM CHLORIDE CRYS ER 20 MEQ PO TBCR
40.0000 meq | EXTENDED_RELEASE_TABLET | Freq: Once | ORAL | Status: AC
  Administered 2018-11-21: 15:00:00 40 meq via ORAL
  Filled 2018-11-21: qty 2

## 2018-11-21 MED ORDER — PROMETHAZINE HCL 25 MG PO TABS
12.5000 mg | ORAL_TABLET | Freq: Four times a day (QID) | ORAL | Status: DC | PRN
  Administered 2018-11-21: 12.5 mg via ORAL
  Filled 2018-11-21: qty 1

## 2018-11-21 MED ORDER — HEPARIN SODIUM (PORCINE) 5000 UNIT/ML IJ SOLN
5000.0000 [IU] | Freq: Three times a day (TID) | INTRAMUSCULAR | Status: DC
  Administered 2018-11-21: 06:00:00 5000 [IU] via SUBCUTANEOUS
  Filled 2018-11-21: qty 1

## 2018-11-21 MED ORDER — LACTULOSE 10 GM/15ML PO SOLN
10.0000 g | Freq: Every day | ORAL | Status: DC
  Administered 2018-11-21 – 2018-11-22 (×2): 10 g via ORAL
  Filled 2018-11-21 (×2): qty 15

## 2018-11-21 MED ORDER — ACETAMINOPHEN 500 MG PO TABS
1000.0000 mg | ORAL_TABLET | Freq: Once | ORAL | Status: AC
  Administered 2018-11-21: 01:00:00 1000 mg via ORAL
  Filled 2018-11-21: qty 2

## 2018-11-21 MED ORDER — LORATADINE 10 MG PO TABS
10.0000 mg | ORAL_TABLET | Freq: Every day | ORAL | Status: DC
  Administered 2018-11-21 – 2018-11-24 (×4): 10 mg via ORAL
  Filled 2018-11-21 (×4): qty 1

## 2018-11-21 MED ORDER — MOMETASONE FUROATE 200 MCG/ACT IN AERO: 2.0000 | INHALATION_SPRAY | Freq: Two times a day (BID) | RESPIRATORY_TRACT | Status: DC

## 2018-11-21 MED ORDER — BUDESONIDE 0.5 MG/2ML IN SUSP
0.5000 mg | Freq: Two times a day (BID) | RESPIRATORY_TRACT | Status: DC
  Administered 2018-11-21 – 2018-11-24 (×4): 0.5 mg via RESPIRATORY_TRACT
  Filled 2018-11-21 (×6): qty 2

## 2018-11-21 MED ORDER — PANTOPRAZOLE SODIUM 40 MG PO TBEC
40.0000 mg | DELAYED_RELEASE_TABLET | Freq: Two times a day (BID) | ORAL | Status: DC
  Administered 2018-11-21 – 2018-11-24 (×7): 40 mg via ORAL
  Filled 2018-11-21 (×7): qty 1

## 2018-11-21 MED ORDER — VANCOMYCIN HCL 10 G IV SOLR
2000.0000 mg | Freq: Once | INTRAVENOUS | Status: AC
  Administered 2018-11-21: 2000 mg via INTRAVENOUS
  Filled 2018-11-21 (×17): qty 2000

## 2018-11-21 MED ORDER — ACETAMINOPHEN 650 MG RE SUPP: 650.0000 mg | Freq: Four times a day (QID) | RECTAL | Status: DC | PRN

## 2018-11-21 MED ORDER — SODIUM CHLORIDE 0.9 % IV SOLN
INTRAVENOUS | Status: DC | PRN
  Administered 2018-11-21: 15:00:00 250 mL via INTRAVENOUS

## 2018-11-21 MED ORDER — SODIUM CHLORIDE 0.9 % IV SOLN
2.0000 g | Freq: Three times a day (TID) | INTRAVENOUS | Status: DC
  Administered 2018-11-21 – 2018-11-24 (×10): 2 g via INTRAVENOUS
  Filled 2018-11-21 (×13): qty 2

## 2018-11-21 MED ORDER — SODIUM CHLORIDE 0.9 % IV SOLN
2.0000 g | Freq: Once | INTRAVENOUS | Status: AC
  Administered 2018-11-21: 2 g via INTRAVENOUS
  Filled 2018-11-21: qty 2

## 2018-11-21 MED ORDER — ACETAMINOPHEN 325 MG PO TABS
650.0000 mg | ORAL_TABLET | Freq: Four times a day (QID) | ORAL | Status: DC | PRN
  Administered 2018-11-21 – 2018-11-23 (×9): 650 mg via ORAL
  Filled 2018-11-21 (×9): qty 2

## 2018-11-21 MED ORDER — SODIUM CHLORIDE 0.9 % IV SOLN
INTRAVENOUS | Status: DC
  Administered 2018-11-21: 10:00:00 via INTRAVENOUS

## 2018-11-21 MED ORDER — ALBUTEROL SULFATE HFA 108 (90 BASE) MCG/ACT IN AERS: 2.0000 | INHALATION_SPRAY | RESPIRATORY_TRACT | Status: DC | PRN

## 2018-11-21 MED ORDER — VANCOMYCIN HCL 10 G IV SOLR
1750.0000 mg | Freq: Two times a day (BID) | INTRAVENOUS | Status: DC
  Administered 2018-11-21 – 2018-11-23 (×6): 1750 mg via INTRAVENOUS
  Filled 2018-11-21 (×8): qty 1750

## 2018-11-21 MED ORDER — LEVOTHYROXINE SODIUM 50 MCG PO TABS
250.0000 ug | ORAL_TABLET | Freq: Every day | ORAL | Status: DC
  Administered 2018-11-21 – 2018-11-24 (×4): 250 ug via ORAL
  Filled 2018-11-21 (×4): qty 2

## 2018-11-21 NOTE — H&P (Addendum)
[facility] Admission History and Physical Service Pager: [phone]  Patient name: Holly Mueller Medical record number: [id number] Date of birth: Nov 11, 1965 Age: 53 y.o. Gender: female  Primary Care Provider: Cleophas Dunker, DO Consultants: None Code Status: Full Preferred Emergency Contact: [name] [phone]  Chief Complaint: Cough, fever, vomiting  Assessment and Plan: Holly Mueller is a 53 y.o. female presenting with cough, fever and vomiting . 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  Respiratory Tract Infection, likely viral Vitals stable on room air with O2 sats 94-96%, no fever present in ED and tachypnea of 28 breaths per minute.CXR shows streaky atelectasis vs minimal infiltrate in left base. This appears similar to previous CXR in April. Labs were significant for a WBC count of 1.8, lactic acid of 2.6 and a total bilirubin of 2.5. Patient was started on Cefepime and Vancomycin in the Emergency Department as well as given fluids. She was also continued on acetaminophen for her headache. Her max temperature in the ED was 99.36F. Etiology: Patient did reportedly have 101.4 fever via ems but no fever in emergency department and satting well on room air may suggest more viral etiology vs bacterial. Frequent coughing noted on exam though without production. She has been using acetaminophen, including receiving it in the ED which could mask higher fever. CXR read shows streaky atelectasis vs minimal infiltrate in left base, however upon looking at her previous CXR there appears to be similar findings per my eye, which could also suggest more of a viral cause. Patient admits to normally having a cough and says this has actually improved since her last EGD a few months ago. Her lung sounds are mostly clear with no wheezing and only a few mild crackles noted  bilaterally, which may be a part of her baseline. Could also be secondary to receiving IVF in ED with known cirrhosis, has prior h/o ascites though no other evidence of fluid overload on exam. She has had non-bilious vomiting the past two days but states this seems to be caused by her coughing more than anything else. I think at this point a viral respiratory infection is the most likely cause, however bacterial cause can not be ruled out. COVID negative. Did present tachycardic although without shortness of breath and lack of DVT symptoms, unlikely PE. Also had LE doppler end of June, negative for DVT. Has h/o GERD which could also contribute to cough. Cough not exacerbated by positional changes, no h/o orthopnea, PND, or leg swelling to suggest CHF. Last ECHO 09/2018 with nl function. - Admit to care of Dr. Ardelia Mems, med-surge - Supportative therapy  - Continue cefepime and vancomycin given CXR findings and leukopenia, can likely narrow soon - monitor respiratory status - monitor for fevers - f/u on BMP, lactic acid, blood and urine cultures.  Gastric Varices 2/2 Cirrhosis from [place] With report some episodes of scant blood in vomit. No blood in stool. Previously had marked hematemesis prompting admission to [facility] in May 2020 for ascites/anasarca with a hx of hematemesis/melena. She was transferred to [facility] for gastric varyx with recent bleeding and had a cyanoacrylate injection into the varyx. She also required several transfusions of RBCs during that time and an ICU stay for hypotension s/p procedure. She was discharged on 5/25 and was recommended to return to [facility] for EUS and possible repeat cyanoacrylate in late June. Previously on lasix/spironolactone and nadolol but was not continued on  discharge due to lower blood pressures. Continues on protonix. Prior gastric biopsy with chronic gastritis, no evidence of H pylori or malignancy. Completely oriented, no evidence of hepatic encephalopathy, no asterixis.  As her current episodes of vomiting appear to be post-tussive, without marked blood in vomit or stool, and other viral URI sx, believe symptoms largely d/t viral URI. - Had previously scheduled appointment for follow up EGD today at 7am with Dr. Rush Landmark - Follow up with Dr. Rush Landmark regarding patient's admission and status of her scheduled EGD - Keep NPO until determining if GI still plans to do EGD - am PT/INR - monitor CMP/CBC  Type 2 Diabetes No longer on insulin, previously on Lantus 25u at recent hospitalization. Was told to stop insulin after [facility] admission. Only on metformin 540m BID. Well controlled with last A1C in March was 6.2. - Given mild lactic acidosis and well controlled DM per last A1C hold Metformin at this time - Monitor blood glucose with BMP  GERD Could be contributing to current cough although not exacerbated by positional changes.  - Continue home protonix  Interstitial Lung Disease Likely contributing to frequent coughing. On asmanex at home. Reports she does not have albuterol at home, only gets it when she is discharged from the hospital. No wheezing on exam. Frequent coughing though maintaining appropriate saturations. - continue asmanex - albuterol prn  Hip pain Noted from "sitting a while" on the floor after she slipped prior to presentation. Exam with slight pain to right hip internal rotation. Per chart review, has had bilateral hip pain 2/2 OA for years. No evidence of fracture on exam. - monitor - tylenol prn - check CK  Bicytopenia, chronic Thrombocytopenia and leukopenia, 59 and 1.8, respectively. Secondary to hepatic cirrhosis. Baseline WBC prior to recent admission for decompensated hepatic cirrhosis normal. WBC improved from previous discharge. Platelets lower than prior. Of note, did require transfusion of FFP at previous admission. Though she has cough and possible infiltrate on CXR, likely more related to atelectasis and is without fever, but  unable to r/o bacterial cause at this time, therefore will continue antibiotics, likely narrow soon.  - continue Cefepime and Vanc  - monitor with daily CBC  Hypothyroidism On synthroid. Last TSH 0.181 09/2018.  - continue home med - defer TSH recheck during acute hospitalization  Allergic rhinitis - continue home flonase and zyrtec.  FEN/GI: NPO Prophylaxis: heparin pending possible egd and low plts  Disposition: Pending medical workup.  History of Present Illness:  EKataleya Zauggis a 53y.o. female presenting with cough, vomiting, diarrhea, headache since yesterday, that has gotten worse today. She states she hasn't been able to keep anything down today and thinks she has vomited about 6 times since yesterday. She states that when she was vomiting today, she slipped off the loveseat where she was sitting and slid to the ground. She thinks she was there for a few hours but denies hitting head when she fell. She eventually got herself up and walked to the door but found it hard to stand up or walk. She called EMS who promptly came and brought her to the ED.  Her vomiting appears to be mostly post-tussive. She says that she noticed some very light blood in her vomit one time during this illness. She admits to 2-3 loose bowel movements today without blood as well as one formed stool in the ED.  She had not taken her temperature over the course of her illness but states that EMS  took it at 101.4. She also states she typically has a chronic cough, but this has been better since procedure at [facility] 09/2018. She has also been taking tylenol q8hr for headache today without relief.   She does admit to having some congestion which she attributes to allergies. She also states she has some abdominal pain with coughing and that her coughing is productive of mucus and a small amount of blood. Her coughing is not worse with lying flat. No PND. No recent swelling of legs or erythema. Denies sick contacts,  travel. Ate some [place] food yesterday with her sister but states sister is not sick. Does not use tobacco products. Denies alcohol or illicit drug use. She lives with roommate and 2 kids. She is able to manage her own medications, ambulates without assistance and is independent in her ADLs.   Review Of Systems: Per HPI with the following additions:   Review of Systems  Constitutional: Positive for chills.  HENT: Negative for congestion.   Eyes: Negative for blurred vision and double vision.  Respiratory: Positive for cough and sputum production.   Cardiovascular: Negative for chest pain and PND.  Gastrointestinal: Positive for abdominal pain, nausea and vomiting. Negative for blood in stool, constipation and melena.  Genitourinary: Negative for dysuria and frequency.  Neurological: Positive for dizziness and headaches.    Patient Active Problem List   Diagnosis Date Noted  . Primary biliary cholangitis ([place]) 10/24/2018  . Gastric varices with bleeding   . Anemia 09/22/2018  . Palpitations 09/13/2018  . Hemoptysis 08/28/2018  . Pancytopenia ([place]) 08/28/2018  . Uninsured 04/04/2018  . ILD (interstitial lung disease) ([place]) 03/23/2018  . Food insecurity 10/15/2017  . Hyperglycemia 10/06/2017  . Ectatic aorta ([place]) 09/11/2017  . Left leg pain   . Controlled type 2 diabetes mellitus without complication, without long-term current use of insulin ([place])   . 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 ([place]) 11/17/2016  . Bicytopenia 10/24/2016  . GERD (gastroesophageal reflux disease) 10/23/2016  . Cough 12/09/2015  . Chronic pain of left thumb 10/09/2015  . Precordial chest pain 08/03/2015  . Bursitis of left hip 09/14/2013  . DOE (dyspnea on exertion) 08/17/2013  . Ganglion cyst of wrist 08/24/2012  . Right hip pain 08/23/2012  . Umbilical hernia 82/50/5397  . Healthcare maintenance 02/14/2012  . Osteoarthritis 02/12/2012  . Allergic rhinitis 08/26/2011  . Obesity  07/08/2010  . SEIZURES, HX OF 12/12/2009  . DENTAL CARIES 12/18/2008  . DEPRESSION 07/26/2008  . MIGRAINE HEADACHE 11/15/2007  . Hypothyroidism 10/12/2006  . Restrictive lung disease 07/30/2006    Past Medical History: Past Medical History:  Diagnosis Date  . Acute respiratory failure with hypoxia ([place]) 09/22/2018  . Arthritis    bursitis left hip flares-not an issue  . Chronic kidney disease   . Diabetes mellitus without complication ([place])   . Edentulous    10-19-13 at present  . Epilepsy ([place]) in 1972   No seizures since 1972. Previously treated with phenobarbital.   . Gastric varices with bleeding   . GERD (gastroesophageal reflux disease) 2008  . Headache(784.0)    hx of migraines   . 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 ([place])   . Hypothyroidism 2008  . Lower esophageal ring 08/18/2013  . Pneumonia   . Primary biliary cholangitis ([place]) 10/24/2018  . Seasonal allergies 2003  . Shortness of breath   . Wears glasses     Past Surgical History: Past Surgical History:  Procedure Laterality Date  .  ABDOMINAL HYSTERECTOMY     " partial "  . BALLOON DILATION N/A 10/24/2013   Procedure: BALLOON DILATION;  Surgeon: Gatha Mayer, MD;  Location: [facility];  Service: Endoscopy;  Laterality: N/A;  . BIOPSY  09/26/2018   Procedure: BIOPSY;  Surgeon: Thornton Park, MD;  Location: [facility];  Service: Gastroenterology;;  . CESAREAN SECTION     x2  . DENTAL SURGERY     multiple extractions 3'15  . DILATION AND CURETTAGE OF UTERUS    . ESOPHAGOGASTRODUODENOSCOPY N/A 10/24/2013   Procedure: ESOPHAGOGASTRODUODENOSCOPY (EGD);  Surgeon: Gatha Mayer, MD;  Location: [facility];  Service: Endoscopy;  Laterality: N/A;  . ESOPHAGOGASTRODUODENOSCOPY (EGD) WITH PROPOFOL N/A 09/26/2018   Procedure: ESOPHAGOGASTRODUODENOSCOPY (EGD) WITH PROPOFOL;  Surgeon: Thornton Park, MD;  Location: [facility];  Service: Gastroenterology;  Laterality: N/A;  . IR PARACENTESIS   09/23/2018  . LIVER BIOPSY  06/30/2012   Procedure: LIVER BIOPSY;  Surgeon: Shann Medal, MD;  Location: [facility];  Service: General;;  . NOVASURE ABLATION    . SHOULDER ARTHROSCOPY Right 2011  . UMBILICAL HERNIA REPAIR N/A 06/30/2012   Procedure: remove umbilicus;  Surgeon: Shann Medal, MD;  Location: [facility];  Service: General;  Laterality: N/A;  . VENTRAL HERNIA REPAIR N/A 06/30/2012   Procedure: LAPAROSCOPIC VENTRAL HERNIA;  Surgeon: Shann Medal, MD;  Location: [facility];  Service: General;  Laterality: N/A;  With Mesh  . VIDEO BRONCHOSCOPY Bilateral 07/20/2018   Procedure: VIDEO BRONCHOSCOPY WITHOUT FLUORO;  Surgeon: Brand Males, MD;  Location: [facility];  Service: Cardiopulmonary;  Laterality: Bilateral;  . WISDOM TOOTH EXTRACTION      Social History: Social History   Tobacco Use  . Smoking status: Former Smoker    Packs/day: 0.25    Types: Cigarettes    Quit date: 1997    Years since quitting: 23.5  . Smokeless tobacco: Never Used  . Tobacco comment: social  Substance Use Topics  . Alcohol use: Not Currently  . Drug use: No   Additional social history:  Please also refer to relevant sections of EMR.  Family History: Family History  Problem Relation Age of Onset  . Hypertension Mother   . Diabetes Mother   . Cirrhosis Mother        ? medications  . Lung cancer Father   . Stroke Father   . Diabetes Sister   . Hypertension Sister   . Colon cancer Neg Hx   . Esophageal cancer Neg Hx   . Rectal cancer Neg Hx      Allergies and Medications: Allergies  Allergen Reactions  . Aspirin Nausea Only    Upset stomach   No current facility-administered medications on file prior to encounter.    Current Outpatient Medications on File Prior to Encounter  Medication Sig Dispense Refill  . albuterol (PROAIR HFA) 108 (90 Base) MCG/ACT inhaler INHALE 2 PUFFS INTO THE LUNGS EVERY 6 HOURS AS NEEDED FOR SHORTNESS OF BREATH (Patient taking differently: Inhale 2 puffs  into the lungs every 4 (four) hours as needed for wheezing or shortness of breath. ) 8.5 g 5  . albuterol (PROVENTIL) (2.5 MG/3ML) 0.083% nebulizer solution Take 3 mLs (2.5 mg total) by nebulization every 6 (six) hours as needed for wheezing or shortness of breath. 75 mL 2  . cetirizine (ZYRTEC) 10 MG tablet Take 1 tablet (10 mg total) by mouth daily. 30 tablet 11  . fluticasone (FLONASE) 50 MCG/ACT nasal spray Place 2 sprays into both nostrils  daily.    . levothyroxine (SYNTHROID) 125 MCG tablet Take 2 tablets (250 mcg total) by mouth daily. 60 tablet 0  . metFORMIN (GLUCOPHAGE) 500 MG tablet Take 1 tablet (500 mg total) by mouth 2 (two) times daily with a meal. (Patient taking differently: Take 1,000 mg by mouth 2 (two) times daily with a meal. ) 180 tablet 2  . Mometasone Furoate (ASMANEX HFA) 200 MCG/ACT AERO Inhale 2 puffs into the lungs 2 (two) times daily. 1 Inhaler 0  . pantoprazole (PROTONIX) 40 MG tablet Take 1 tablet (40 mg total) by mouth 2 (two) times daily before a meal. 60 tablet 3  . ursodiol (ACTIGALL) 500 MG tablet Take 1 tablet (500 mg total) by mouth 3 (three) times daily. 270 tablet 3  . blood glucose meter kit and supplies KIT Dispense based on patient and insurance preference. Use up to four times daily as directed. (FOR ICD-9 250.00, 250.01). 1 each 0  . blood glucose meter kit and supplies Dispense based on patient and insurance preference. Use up to four times daily as directed. (FOR ICD-10 E10.9, E11.9). 1 each 0  . glucose blood (TRUE METRIX BLOOD GLUCOSE TEST) test strip Use as instructed 100 each 12  . insulin glargine (LANTUS) 100 UNIT/ML injection Inject 0.25 mLs (25 Units total) into the skin at bedtime. (Patient not taking: Reported on 11/11/2018) 10 mL 3  . TRUEplus Lancets 28G MISC Use to check blood sugar as directed. 100 each 1    Objective: BP 116/63 (BP Location: Right Arm)   Pulse 92   Temp 98.7 F (37.1 C) (Oral)   Resp (!) 28   Ht _0  (1.676 m)   Wt  106.6 kg   LMP  (LMP Unknown)   SpO2 94%   BMI 37.93 kg/m  Exam: General: Alert and oriented and in no apparent distress Eyes: pupils equal, round and reactive to light ENTM: No pharyngeal erythema Neck: non-painful  Cardiovascular: RRR, no murmurs appreciated Respiratory: No wheezing, few bibasilar crackles present bilaterally, frequent coughing otherwise normal WOB with appropriate saturations on RA. Gastrointestinal: bowel sounds present, no abdominal pain, negative murphy's sign MSK: Strength 5/5 in arm extension, grip, hip flexion bilaterally. Mild pain on right hip internal rotation vs left. No asterixis. Derm: No rashes noted Psych: Speech appropriate for situation  Labs and Imaging: CBC BMET  Recent Labs  Lab 11/20/18 2253  WBC 1.8*  HGB 12.1  HCT 36.8  PLT 59*   Recent Labs  Lab 11/20/18 2253  NA 139  K 3.6  CL 111  CO2 21*  BUN <5*  CREATININE 0.60  GLUCOSE 166*  CALCIUM 8.6*     EKG: My interpretation: Sinus tach, normal axis  Dg Chest Port 1 View  Result Date: 11/20/2018 CLINICAL DATA:  Shortness of breath with vomiting EXAM: PORTABLE CHEST 1 VIEW COMPARISON:  09/22/2018, 08/27/2018 FINDINGS: Low lung volumes. Streaky atelectasis or minimal infiltrate at the left base. Stable cardiomediastinal silhouette. No pneumothorax. Mild chronic interstitial lung disease. IMPRESSION: Low lung volumes. Streaky atelectasis or minimal infiltrate at the left base. Electronically Signed   By: Donavan Foil M.D.   On: 11/20/2018 23:15   Lurline Del, MD 11/21/2018, 5:35 AM PGY1, [place] Intern pager: [phone], text pages welcome  FPTS Upper-Level Resident Addendum   I have independently interviewed and examined the patient. I have discussed the above with the original author and agree with their documentation. My edits for correction/addition/clarification are in green. Please  see also any attending notes.    Rory Percy, DO PGY-3, [facility] 11/21/2018 6:45 AM  FPTS Service pager: [phone] (text pages welcome through [facility])

## 2018-11-21 NOTE — Progress Notes (Signed)
PHARMACY - PHYSICIAN COMMUNICATION CRITICAL VALUE ALERT - BLOOD CULTURE IDENTIFICATION (BCID)  Results for orders placed or performed during the hospital encounter of 11/20/18  Blood Culture ID Panel (Reflexed) (Collected: 11/20/2018 11:25 PM)  Result Value Ref Range   Enterococcus species NOT DETECTED NOT DETECTED   Listeria monocytogenes NOT DETECTED NOT DETECTED   Staphylococcus species DETECTED (A) NOT DETECTED   Staphylococcus aureus (BCID) NOT DETECTED NOT DETECTED   Methicillin resistance DETECTED (A) NOT DETECTED   Streptococcus species NOT DETECTED NOT DETECTED   Streptococcus agalactiae NOT DETECTED NOT DETECTED   Streptococcus pneumoniae NOT DETECTED NOT DETECTED   Streptococcus pyogenes NOT DETECTED NOT DETECTED   Acinetobacter baumannii NOT DETECTED NOT DETECTED   Enterobacteriaceae species NOT DETECTED NOT DETECTED   Enterobacter cloacae complex NOT DETECTED NOT DETECTED   Escherichia coli NOT DETECTED NOT DETECTED   Klebsiella oxytoca NOT DETECTED NOT DETECTED   Klebsiella pneumoniae NOT DETECTED NOT DETECTED   Proteus species NOT DETECTED NOT DETECTED   Serratia marcescens NOT DETECTED NOT DETECTED   Haemophilus influenzae NOT DETECTED NOT DETECTED   Neisseria meningitidis NOT DETECTED NOT DETECTED   Pseudomonas aeruginosa NOT DETECTED NOT DETECTED   Candida albicans NOT DETECTED NOT DETECTED   Candida glabrata NOT DETECTED NOT DETECTED   Candida krusei NOT DETECTED NOT DETECTED   Candida parapsilosis NOT DETECTED NOT DETECTED   Candida tropicalis NOT DETECTED NOT DETECTED    Name of physician (or Provider) Contacted: none  Changes to prescribed antibiotics required: no change required - currently on broad spectrum abx vancomycin and cefepime   Bonnita Nasuti Pharm.D. CPP, BCPS Clinical Pharmacist [phone] 11/21/2018 9:05 PM

## 2018-11-21 NOTE — Consult Note (Addendum)
[facility] Consult: 8:15 AM 11/21/2018  LOS: 0 days    Referring Provider: Dr Ardelia Mems  Primary Care Physician:  Cleophas Dunker, DO Primary Gastroenterologist:  Dr. Carlean Purl     Reason for Consultation:  Emesis.     HPI: Holly Mueller is a 53 y.o. female.  Hx interstitial lung disease. IDDM. Hypothyroidism.  Obesity.  Pancytopenia.. Lap ventral hernia repair 2014.   Primary biliary cholangitis with cirrhosis of liver.    Pancytopenia.  IDA.   10/2013 EGD.  For dysphagia.  Lower esophageal ring balloon dilated.  Suspected eosinophilic esophagitis with longitudinal furrows, white spots throughout the esophagus.  This was biopsied.  Pathology showed chronic gastritis, no H. pylori.  Esophagealbiopsy showed reactive squamous mucosa, rare neutrophil.  No increased intraepithelial eosinophils. No previous Colonoscopy.   06/2017 abdominal ultrasound: Cirrhotic liver.  Splenomegaly.  Ectatic abdominal aortic measuring up to 2.6 cm   5/15 - 09/2018 admission with 25# wt gain, dyspnea, swelling.  CT angio chest confirmed new ascites.  Upper abdominal, perigastric varices.  No PE.  Hgb 6.5 (from 8 2 months prior) 1.5 liter paracentesis, WBCs 215, neutrophils 26.  MELD 11.   5/18 EGD: type 1, isolated, gastric varices, with ulceration and bleeding.  Non-bleeding erosive gastropathy (DJ:[id number]: no villous atrophy, no increased lymphocytes, non-specific histopathologic changes.  Gastric: chronic gastritis, no H pylori).  No duodenal varices.  Normal esophagus.  Treated with PPI, octreotide, rocephin.   Transferred to [facility] for 5/20 EGD  with cyanoacrylate injection to oozing gastric varix. Received 4 PRBC, 1 FFP, 1 platelet, parenteral iron.  Completed 72 hours Octreotide on 5/22  10/06/18 CTA abdomen showed splenic  artery aneurysm (1.1 cm max). Splenomegaly. Cirrhosis.  Nodularity surrounding the left adrenal gland likely portosystemic.collaterals, poorly evaluated on this arterial phase study.  Reticular lung opacities at bases, likely ILD.  Normal celiac, superior mesenteric, and inferior mesenteric arteries.   Plan for fup EUS/EGD for fup of varices this morning.  Dc on BID PPI.  BP too soft for BB.   238 # on 5/28. Child's A-B   Unemployed, no insurance. Medicaid application pending. Unable to afford ursodiol until finally able to get this ~ 6/18.   Pt was staying with her sister over the weekend since the home she was living in with her sons lost power due to nonpayment of [employer].  She was doing fine until mid to later in the afternoon yesterday when she developed nausea, non-bloody emesis, coughing, fever, chills, sweats.  Brown stool in the morning.  No history of melena or bleeding per rectum.  At 1 point when she was on the front porch trying to get cell phone service, she slumped down, hit the porch but did not suffer any major trauma.  She was not syncopal.  She reached 911 who then arrived and transported her to the ED. No fever.  Slightly tachycardic from 104-114.  Systolic blood pressures in the 130s.  No hypoxia. Hgb 12.2 >> 10.9.  Was 10.1 on 5/28.   Platelets 58K.  WBCs 1.3.  K 3.4.  BUN, Creat, Na ok.  T bili 2.6.  Alk phos 93.  AST/ALT 51/28.    INR 1.3,   CXR with atx vs infiltrate at left base,       Social: she lives with her two sons and roommate. She lives [place], [place]. Emergency contact is [place] Side [phone].   Past Medical History:  Diagnosis Date  . Acute respiratory failure with hypoxia ([place]) 09/22/2018  . Arthritis    bursitis left hip flares-not an issue  . Chronic kidney disease   . Diabetes mellitus without complication ([place])   . Edentulous    10-19-13 at present  . Epilepsy ([place]) in 1972   No seizures since 1972. Previously treated with phenobarbital.   .  Gastric varices with bleeding   . GERD (gastroesophageal reflux disease) 2008  . Headache(784.0)    hx of migraines   . 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 ([place])   . Hypothyroidism 2008  . Lower esophageal ring 08/18/2013  . Pneumonia   . Primary biliary cholangitis ([place]) 10/24/2018  . Seasonal allergies 2003  . Shortness of breath   . Wears glasses     Past Surgical History:  Procedure Laterality Date  . ABDOMINAL HYSTERECTOMY     " partial "  . BALLOON DILATION N/A 10/24/2013   Procedure: BALLOON DILATION;  Surgeon: Gatha Mayer, MD;  Location: [facility];  Service: Endoscopy;  Laterality: N/A;  . BIOPSY  09/26/2018   Procedure: BIOPSY;  Surgeon: Thornton Park, MD;  Location: [facility];  Service: Gastroenterology;;  . CESAREAN SECTION     x2  . DENTAL SURGERY     multiple extractions 3'15  . DILATION AND CURETTAGE OF UTERUS    . ESOPHAGOGASTRODUODENOSCOPY N/A 10/24/2013   Procedure: ESOPHAGOGASTRODUODENOSCOPY (EGD);  Surgeon: Gatha Mayer, MD;  Location: [facility];  Service: Endoscopy;  Laterality: N/A;  . ESOPHAGOGASTRODUODENOSCOPY (EGD) WITH PROPOFOL N/A 09/26/2018   Procedure: ESOPHAGOGASTRODUODENOSCOPY (EGD) WITH PROPOFOL;  Surgeon: Thornton Park, MD;  Location: [facility];  Service: Gastroenterology;  Laterality: N/A;  . IR PARACENTESIS  09/23/2018  . LIVER BIOPSY  06/30/2012   Procedure: LIVER BIOPSY;  Surgeon: Shann Medal, MD;  Location: [facility];  Service: General;;  . NOVASURE ABLATION    . SHOULDER ARTHROSCOPY Right 2011  . UMBILICAL HERNIA REPAIR N/A 06/30/2012   Procedure: remove umbilicus;  Surgeon: Shann Medal, MD;  Location: [facility];  Service: General;  Laterality: N/A;  . VENTRAL HERNIA REPAIR N/A 06/30/2012   Procedure: LAPAROSCOPIC VENTRAL HERNIA;  Surgeon: Shann Medal, MD;  Location: [facility];  Service: General;  Laterality: N/A;  With Mesh  . VIDEO BRONCHOSCOPY Bilateral 07/20/2018   Procedure: VIDEO BRONCHOSCOPY WITHOUT  FLUORO;  Surgeon: Brand Males, MD;  Location[facility];  Service: Cardiopulmonary;  Laterality: Bilateral;  . WISDOM TOOTH EXTRACTION      Prior to Admission medications   Medication Sig Start Date End Date Taking? Authorizing Provider  albuterol (PROAIR HFA) 108 (90 Base) MCG/ACT inhaler INHALE 2 PUFFS INTO THE LUNGS EVERY 6 HOURS AS NEEDED FOR SHORTNESS OF BREATH Patient taking differently: Inhale 2 puffs into the lungs every 4 (four) hours as needed for wheezing or shortness of breath.  04/25/18  Yes Rory Percy, DO  albuterol (PROVENTIL) (2.5 MG/3ML) 0.083% nebulizer solution Take 3 mLs (2.5 mg total) by nebulization every 6 (six) hours as needed for wheezing or shortness of breath. 04/25/18  Yes Rory Percy, DO  cetirizine (ZYRTEC) 10 MG  tablet Take 1 tablet (10 mg total) by mouth daily. 01/27/17  Yes [name], MD  fluticasone [facility]) 50 MCG/ACT nasal spray Place 2 sprays into both nostrils daily.   Yes [provider]  levothyroxine (SYNTHROID) 125 MCG tablet Take 2 tablets (250 mcg total) by mouth daily. 10/17/18  Yes [name], MD  metFORMIN (GLUCOPHAGE) 500 MG tablet Take 1 tablet (500 mg total) by mouth 2 (two) times daily with a meal. Patient taking differently: Take 1,000 mg by mouth 2 (two) times daily with a meal.  10/07/18  Yes [name], MD  Mometasone Furoate [facility] HFA) 200 MCG/ACT AERO Inhale 2 puffs into the lungs 2 (two) times daily. 05/25/18  Yes Brand Males, MD  pantoprazole (PROTONIX) 40 MG tablet Take 1 tablet (40 mg total) by mouth 2 (two) times daily before a meal. 10/17/18  Yes [name], MD  ursodiol (ACTIGALL) 500 MG tablet Take 1 tablet (500 mg total) by mouth 3 (three) times daily. 10/26/18  Yes Gatha Mayer, MD  blood glucose meter kit and supplies KIT Dispense based on patient and insurance preference. Use up to four times daily as directed. (FOR ICD-9 250.00, 250.01). 06/19/17   Rory Percy,  DO  blood glucose meter kit and supplies Dispense based on patient and insurance preference. Use up to four times daily as directed. (FOR ICD-10 E10.9, E11.9). 09/02/18   Dessa Phi, DO  glucose blood (TRUE METRIX BLOOD GLUCOSE TEST) test strip Use as instructed 09/16/18   [name], MD  insulin glargine (LANTUS) 100 UNIT/ML injection Inject 0.25 mLs (25 Units total) into the skin at bedtime. Patient not taking: Reported on 11/11/2018 09/02/18   Dessa Phi, DO  TRUEplus Lancets 28G MISC Use to check blood sugar as directed. 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 budesonide (PULMICORT) nebulizer solution  0.5 mg Nebulization BID  . fluticasone  2 spray Each Nare Daily  . heparin  5,000 Units Subcutaneous Q8H  . levothyroxine  250 mcg Oral Q0600  . loratadine  10 mg Oral Daily  . pantoprazole  40 mg Oral BID AC  . ursodiol  600 mg Oral BID   Infusions: . ceFEPime (MAXIPIME) IV    . vancomycin     PRN Meds: acetaminophen **OR** acetaminophen, albuterol, promethazine   Allergies as of 11/20/2018 - Review Complete 11/20/2018  Allergen Reaction Noted  . Aspirin Nausea Only 03/29/2012    Family History  Problem Relation Age of Onset  . Hypertension Mother   . Diabetes Mother   . Cirrhosis Mother        ? medications  . Lung cancer Father   . Stroke Father   . Diabetes Sister   . Hypertension Sister   . Colon cancer Neg Hx   . Esophageal cancer Neg Hx   . Rectal cancer Neg Hx       REVIEW OF SYSTEMS: Constitutional: Weakness, fatigue, acute onset yesterday. ENT:  No nose bleeds Pulm: No new dyspnea.  Cough slightly worse but easily nonproductive. CV:  No palpitations, no LE edema.  GU:  No hematuria, no frequency GI:  Per HPI. Heme: Denies unusual or excessive bleeding or  bruising. Transfusions: Per HPI. Neuro: Presyncope yesterday.  No headaches, no peripheral tingling or numbness.  Seizures.  No confusion. Derm:  No itching, no rash or sores.  Endocrine:  No sweats or chills.  No polyuria or dysuria Immunization: Reviewed. Travel:  None beyond local counties in last few months.    PHYSICAL EXAM: Vital signs in last 24 hours: Vitals:   11/21/18 0430 11/21/18 0539  BP:  122/65  Pulse:  93  Resp:  (!) 22  Temp: 98.7 F (37.1 C) 98.8 F (37.1 C)  SpO2:  96%   Wt Readings from Last 3 Encounters:  11/21/18 105.6 kg  10/06/18 108 kg  09/26/18 109.9 kg    General: Unwell, nontoxic appearing WF. Head: No facial asymmetry or swelling.  No signs of head trauma. Eyes: Slight scleral icterus.  No conjunctival pallor.  EOMI. Ears: Not hard of hearing Nose: No congestion or discharge. Mouth: Dry oral mucosa.  Membranes clear.  Edentulous.  Tongue midline. Neck: No JVD, no masses, no thyromegaly. Lungs: Clear bilaterally.  No labored breathing.  No cough. Heart: RRR.  Slight 1/6 systolic murmur.  No rubs or gallops. Abdomen: Obese.  Not tender, not distended.  Soft.  Bowel sounds active.  No organomegaly, bruits, hernias..   Rectal: Deferred Musc/Skeltl: No joint redness or swelling.  No gross musculoskeletal deformities. Extremities: CCE. Neurologic: Oriented x3.  Good historian.  Speech a bit slow but precise and easy to understand.  Slight asterixis.  No resting tremor. Skin: Minor petechiae in a linear pattern on the left upper arm, she says this is where a blood pressure cuff had been applied. Nodes: No cervical adenopathy Psych: Calm, pleasant, cooperative.   LAB RESULTS: Recent Labs    11/20/18 2253 11/21/18 0611  WBC 1.8* 1.3*  HGB 12.1 10.9*  HCT 36.8 33.8*  PLT 59* 58*   BMET Lab Results  Component Value Date   NA 138 11/21/2018   NA 139 11/20/2018   NA 141 10/06/2018   K  3.4 (L) 11/21/2018   K 3.6 11/20/2018   K 3.9 10/06/2018   CL 112 (H) 11/21/2018   CL 111 11/20/2018   CL 111 10/06/2018   CO2 19 (L) 11/21/2018   CO2 21 (L) 11/20/2018   CO2 23 10/06/2018   GLUCOSE 95 11/21/2018   GLUCOSE 166 (H) 11/20/2018   GLUCOSE 143 (H) 10/06/2018   BUN <5 (L) 11/21/2018   BUN <5 (L) 11/20/2018   BUN 7 10/06/2018   CREATININE 0.54 11/21/2018   CREATININE 0.60 11/20/2018   CREATININE 0.61 10/06/2018   CALCIUM 8.2 (L) 11/21/2018   CALCIUM 8.6 (L) 11/20/2018   CALCIUM 8.9 10/06/2018   LFT Recent Labs    11/20/18 2253 11/21/18 0611  PROT 7.3 6.6  ALBUMIN 2.7* 2.5*  AST 58* 51*  ALT 30 28  ALKPHOS 115 93  BILITOT 2.5* 2.6*   PT/INR Lab Results  Component Value Date   INR 1.3 (H) 09/24/2018   INR 1.4 (H) 08/27/2018   INR 1.1 07/12/2018    Lipase     Component Value Date/Time   LIPASE 46 11/20/2018 2253    . sodium chloride 100 mL/hr at 11/21/18 0933  . ceFEPime (MAXIPIME) IV 2 g (11/21/18 0828)  . vancomycin     . budesonide (PULMICORT) nebulizer solution  0.5 mg Nebulization BID  . fluticasone  2 spray Each Nare Daily  . heparin  5,000 Units Subcutaneous Q8H  . lactulose  10 g Oral Daily  .  levothyroxine  250 mcg Oral Q0600  . loratadine  10 mg Oral Daily  . pantoprazole  40 mg Oral BID AC  . ursodiol  600 mg Oral BID     acetaminophen **OR** acetaminophen, albuterol, promethazine   RADIOLOGY STUDIES: Dg Chest Port 1 View  Result Date: 11/20/2018 CLINICAL DATA:  Shortness of breath with vomiting EXAM: PORTABLE CHEST 1 VIEW COMPARISON:  09/22/2018, 08/27/2018 FINDINGS: Low lung volumes. Streaky atelectasis or minimal infiltrate at the left base. Stable cardiomediastinal silhouette. No pneumothorax. Mild chronic interstitial lung disease. IMPRESSION: Low lung volumes. Streaky atelectasis or minimal infiltrate at the left base. Electronically Signed   By: Donavan Foil M.D.   On: 11/20/2018 23:15    IMPRESSION:   *     Nausea,  vomiting, subjective chills/sweats/fevers. Scant spot of blood seen at one point and emesis.   COVID 19 negative.  Urinalysis negative, urine cultures pending.  Blood cultures pending.  Infiltrate versus atelectasis in left lung base on chest x-ray.  Broad empiric antibiotic coverage currently in place with Maxipime and vancomycin.  *   Pancytopenia, most markedly in the WBC count at 1.3.  Stable anemia.  Platelets in the 50s.   Not received transfusion this admission.  *    PBC with cirrhosis. Finally able to afford and started ursodiol a few weeks ago.  *   GI bleed from gastric varix in 09/2018.  Ultimately treated with EGD and cryoanoacrylate injection/obliteration of gastric varix at [facility]. Current vomiting is essentially nonbloody, at one point she saw a small speck of blood, otherwise no blood, no coffee ground material. Was to undergo repeat, screening EGD and EUS this morning but this has been canceled.  *   Mild hypokalemia.  *    Atelectasis versus infiltrate at base left lung.  History interstitial lung disease.  *    Ongoing social issues with Medicaid in place but this is a family-planning version, not the disabled version    PLAN:     *   EGD and EUS will be rescheduled.  Dr. Carlean Purl planning to see the patient today.  *    She can have clear liquids as tolerated.  *    Given slight asterixis on physical exam, adding lactulose.      Azucena Freed  11/21/2018, 8:15 AM Phone [place] Attending   I have taken an interval history, reviewed the chart and examined the patient. I agree with the Advanced Practitioner's note, impression and recommendations.    My additional thoughts are that we should not scope her in setting of respiratory problems - ? PNA  She does not feel well and her EGD/EUS is elective.  Will allow her to eat and reassess tomorrow but would anticipate she gets done in 1-2 weeks as Dr. Rush Landmark is going to be away.   Need  to be very careful with NS infusion and dc as soon as medically appropriate given cirrhosis and being prone to Na and fluid retention  Gatha Mayer, MD, [facility] 11/21/2018 10:33 AM Pager [phone]

## 2018-11-21 NOTE — Progress Notes (Signed)
Received pt alert and oriented. Pt short of breath upon ambulation to bed. Pt complains of weakness at this time, assisted pt to the commode. Oriented pt to room and use of call light. Will monitor pt.

## 2018-11-21 NOTE — H&P (Deleted)
Family Medicine Teaching Service Daily Progress Note Intern Pager: [phone]  Patient name: Holly Mueller Medical record number: [id number] Date of birth: 02/28/1966 Age: 53 y.o. Gender: female  Primary Care Provider: Cleophas Dunker, DO Consultants: GI Code Status: Full  Pt Overview and Major Events to Date:  07/13- admitted  Assessment and Plan:  Resp Tract Infection Pt reports body aches this morning, no increasing SOB or chest pain. O2 sats 92 on room air.  Vital signs stable and afebrile. Started Cefepime and Vancomycin 07/13  COVID -ve Continue to monitor resp status and maintain O2 sat>92% Monitor for increasing temperature F/u with influenza screening Monitor CBC daily  LA trend 1.4/2.6  Gastric Varices  Pt reports no signs of bleeding. She is currently on Protonix GI is following and plan to reschedule EGD/EUS.  Lactulose started fror mild asterixes Will f/u with Dr. Carlean Purl in am for further recs F/U ammonia level  Type II DM Well controlled, last HbA1C 6.2 07/26/2018 CBG 95 today Continue to monitor CBG with morning labs  GERD Asymptomatic, currently taking Protonix  Interstitial Lung Disease No worsening SOB, on home med Asmanex  Hip Pain No complaints of pain. Monitor for increasing pain, tylenol prn  Chronic Bicytopenia likely secondary to PBC Platelets 58 today  WBC 1.3 today Monitor CBC daily Discontinue s/c heparin due to low platelets  FEN/GI: clear liquid diet, Protonix  PPx: SCD's  Disposition: awaiting GI recs  Subjective:  Pt seen and assessed. She complains of generalized aches today. No worsening SOB. Denies any chest pain, abdo pain.  Pt states she has no decrease in appetite.  Voiding well, bowels moved yesterday.  Objective: Temp:  [98.7 F (37.1 C)-99.3 F (37.4 C)] 98.8 F (37.1 C) (07/13 0539) Pulse Rate:  [91-114] 93 (07/13 0539) Resp:  [21-32] 22 (07/13 0539) BP: (100-137)/(63-80) 122/65 (07/13 0539) SpO2:   [89 %-96 %] 92 % (07/13 0819) Weight:  [105.6 kg-106.6 kg] 105.6 kg (07/13 0536) Physical Exam: General: pt lying in bed in NAD Cardiovascular: RRR, no murmurs appreciated Respiratory: no increased work of breathing, no wheezing, no crackles notes Abdomen: soft, nontender, non distended, BS present  Extremities: minimal lower extremity edema, pulses present  Laboratory: Recent Labs  Lab 11/20/18 2253 11/21/18 0611  WBC 1.8* 1.3*  HGB 12.1 10.9*  HCT 36.8 33.8*  PLT 59* 58*   Recent Labs  Lab 11/20/18 2253 11/21/18 0611  NA 139 138  K 3.6 3.4*  CL 111 112*  CO2 21* 19*  BUN <5* <5*  CREATININE 0.60 0.54  CALCIUM 8.6* 8.2*  PROT 7.3 6.6  BILITOT 2.5* 2.6*  ALKPHOS 115 93  ALT 30 28  AST 58* 51*  GLUCOSE 166* 95      Imaging/Diagnostic Tests: Dg Chest Port 1 View  Result Date: 11/20/2018 CLINICAL DATA:  Shortness of breath with vomiting EXAM: PORTABLE CHEST 1 VIEW COMPARISON:  09/22/2018, 08/27/2018 FINDINGS: Low lung volumes. Streaky atelectasis or minimal infiltrate at the left base. Stable cardiomediastinal silhouette. No pneumothorax. Mild chronic interstitial lung disease. IMPRESSION: Low lung volumes. Streaky atelectasis or minimal infiltrate at the left base. Electronically Signed   By: Donavan Foil M.D.   On: 11/20/2018 23:15    Carollee Leitz, MD 11/21/2018, 9:46 AM PGY-1, [place] Intern pager: [phone], text pages welcome

## 2018-11-21 NOTE — Progress Notes (Signed)
Brief GI Progress Note  Patient was supposed to come in for scheduled outpatient EGD/EUS to evaluate if the previously treated GV had been obliterated. Chart reviewed, patient admitted with possible PNA. Will ask the GI Inpatient service to evaluate the patient to determine urgency of procedure - query if true hematemesis recurrence or other issues that would make [place] have to place her at an increased risk of Anesthesia in setting of possible Upper respiratory infectious process. Keep NPO until GI Inpatient team has chance to evaluate patient and then we will have another discussion with Anesthesia.  Justice Britain, MD [facility] Advanced Endoscopy Office # [phone]

## 2018-11-21 NOTE — Progress Notes (Signed)
CRITICAL VALUE ALERT  Critical Value:  wBC 1.3  Date & Time Notied:  11/21/2018, 5301  Provider Notified: family med   Orders Received/Actions taken: no new orders

## 2018-11-21 NOTE — ED Notes (Signed)
ED TO INPATIENT HANDOFF REPORT  ED Nurse Name and Phone #:  [phone] Threasa Beards, RN  S Name/Age/Gender Holly Mueller 53 y.o. female Room/Bed: 022C/022C  Code Status   Code Status: Prior  Home/SNF/Other Home Patient oriented to: self, place, time and situation Is this baseline? Yes   Triage Complete: Triage complete  Chief Complaint possible sepsis   Triage Note Pt presents to ED from home by [facility]. Pt c/o of weakness, SOB, fever x2d. Emesis x6 today. Pt reports being immunocompromised. NS and zofran given by EMS.   Allergies Allergies  Allergen Reactions  . Aspirin Nausea Only    Upset stomach    Level of Care/Admitting Diagnosis ED Disposition    ED Disposition Condition [facility] Area: [place] [[id number]]  Level of Care: Med-Surg [16]  Covid Evaluation: Confirmed COVID Negative  Diagnosis: Cough [786.2.ICD-9-CM]  Admitting Physician: Rory Percy [[id number]]  Attending Physician: Leeanne Rio [[id number]]  PT Class (Do Not Modify): Observation [104]  PT Acc Code (Do Not Modify): Observation [10022]       B Medical/Surgery History Past Medical History:  Diagnosis Date  . Acute respiratory failure with hypoxia ([place]) 09/22/2018  . Arthritis    bursitis left hip flares-not an issue  . Chronic kidney disease   . Diabetes mellitus without complication ([place])   . Edentulous    10-19-13 at present  . Epilepsy ([place]) in 1972   No seizures since 1972. Previously treated with phenobarbital.   . Gastric varices with bleeding   . GERD (gastroesophageal reflux disease) 2008  . Headache(784.0)    hx of migraines   . 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 ([place])   . Hypothyroidism 2008  . Lower esophageal ring 08/18/2013  . Pneumonia   . Primary biliary cholangitis ([place]) 10/24/2018  . Seasonal allergies 2003  . Shortness of breath   . Wears glasses    Past Surgical History:  Procedure Laterality Date  .  ABDOMINAL HYSTERECTOMY     " partial "  . BALLOON DILATION N/A 10/24/2013   Procedure: BALLOON DILATION;  Surgeon: Gatha Mayer, MD;  Location: [facility];  Service: Endoscopy;  Laterality: N/A;  . BIOPSY  09/26/2018   Procedure: BIOPSY;  Surgeon: Thornton Park, MD;  Location: [facility];  Service: Gastroenterology;;  . CESAREAN SECTION     x2  . DENTAL SURGERY     multiple extractions 3'15  . DILATION AND CURETTAGE OF UTERUS    . ESOPHAGOGASTRODUODENOSCOPY N/A 10/24/2013   Procedure: ESOPHAGOGASTRODUODENOSCOPY (EGD);  Surgeon: Gatha Mayer, MD;  Location: [facility];  Service: Endoscopy;  Laterality: N/A;  . ESOPHAGOGASTRODUODENOSCOPY (EGD) WITH PROPOFOL N/A 09/26/2018   Procedure: ESOPHAGOGASTRODUODENOSCOPY (EGD) WITH PROPOFOL;  Surgeon: Thornton Park, MD;  Location: [facility];  Service: Gastroenterology;  Laterality: N/A;  . IR PARACENTESIS  09/23/2018  . LIVER BIOPSY  06/30/2012   Procedure: LIVER BIOPSY;  Surgeon: Shann Medal, MD;  Location: [facility];  Service: General;;  . NOVASURE ABLATION    . SHOULDER ARTHROSCOPY Right 2011  . UMBILICAL HERNIA REPAIR N/A 06/30/2012   Procedure: remove umbilicus;  Surgeon: Shann Medal, MD;  Location: [facility];  Service: General;  Laterality: N/A;  . VENTRAL HERNIA REPAIR N/A 06/30/2012   Procedure: LAPAROSCOPIC VENTRAL HERNIA;  Surgeon: Shann Medal, MD;  Location: [facility];  Service: General;  Laterality: N/A;  With Mesh  . VIDEO BRONCHOSCOPY Bilateral 07/20/2018   Procedure: VIDEO BRONCHOSCOPY WITHOUT FLUORO;  Surgeon: Brand Males, MD;  Location: [facility];  Service: Cardiopulmonary;  Laterality: Bilateral;  . WISDOM TOOTH EXTRACTION       A IV Location/Drains/Wounds Patient Lines/Drains/Airways Status   Active Line/Drains/Airways    Name:   Placement date:   Placement time:   Site:   Days:   Peripheral IV 11/20/18 Left Antecubital   11/20/18    2208    Antecubital   1   Peripheral IV 11/20/18 Right Antecubital    11/20/18    2332    Antecubital   1          Intake/Output Last 24 hours  Intake/Output Summary (Last 24 hours) at 11/21/2018 0455 Last data filed at 11/20/2018 2208 Gross per 24 hour  Intake 450 ml  Output -  Net 450 ml    Labs/Imaging Results for orders placed or performed during the hospital encounter of 11/20/18 (from the past 48 hour(s))  CBC with Differential     Status: Abnormal   Collection Time: 11/20/18 10:53 PM  Result Value Ref Range   WBC 1.8 (L) 4.0 - 10.5 K/uL   RBC 4.25 3.87 - 5.11 MIL/uL   Hemoglobin 12.1 12.0 - 15.0 g/dL   HCT 36.8 36.0 - 46.0 %   MCV 86.6 80.0 - 100.0 fL   MCH 28.5 26.0 - 34.0 pg   MCHC 32.9 30.0 - 36.0 g/dL   RDW 14.6 11.5 - 15.5 %   Platelets 59 (L) 150 - 400 K/uL    Comment: REPEATED TO VERIFY PLATELET COUNT CONFIRMED BY SMEAR SPECIMEN CHECKED FOR CLOTS Immature Platelet Fraction may be clinically indicated, consider ordering this additional test [id number]    nRBC 0.0 0.0 - 0.2 %   Neutrophils Relative % 75 %   Neutro Abs 1.3 (L) 1.7 - 7.7 K/uL   Lymphocytes Relative 10 %   Lymphs Abs 0.2 (L) 0.7 - 4.0 K/uL   Monocytes Relative 10 %   Monocytes Absolute 0.2 0.1 - 1.0 K/uL   Eosinophils Relative 2 %   Eosinophils Absolute 0.0 0.0 - 0.5 K/uL   Basophils Relative 1 %   Basophils Absolute 0.0 0.0 - 0.1 K/uL   Immature Granulocytes 2 %   Abs Immature Granulocytes 0.03 0.00 - 0.07 K/uL    Comment: Performed at [facility], [address] [place], [place] [address]  Comprehensive metabolic panel     Status: Abnormal   Collection Time: 11/20/18 10:53 PM  Result Value Ref Range   Sodium 139 135 - 145 mmol/L   Potassium 3.6 3.5 - 5.1 mmol/L   Chloride 111 98 - 111 mmol/L   CO2 21 (L) 22 - 32 mmol/L   Glucose, Bld 166 (H) 70 - 99 mg/dL   BUN <5 (L) 6 - 20 mg/dL   Creatinine, Ser 0.60 0.44 - 1.00 mg/dL   Calcium 8.6 (L) 8.9 - 10.3 mg/dL   Total Protein 7.3 6.5 - 8.1 g/dL   Albumin 2.7 (L) 3.5 - 5.0 g/dL   AST 58 (H) 15 -  41 U/L   ALT 30 0 - 44 U/L   Alkaline Phosphatase 115 38 - 126 U/L   Total Bilirubin 2.5 (H) 0.3 - 1.2 mg/dL   GFR calc non Af Amer >60 >60 mL/min   GFR calc Af Amer >60 >60 mL/min   Anion gap 7 5 - 15    Comment: Performed at [facility], [place] [address] [place], [place] [address]  Lipase, blood  Status: None   Collection Time: 11/20/18 10:53 PM  Result Value Ref Range   Lipase 46 11 - 51 U/L    Comment: Performed at [facility] [address], [place], [place] [address]  Lactic acid, plasma     Status: Abnormal   Collection Time: 11/20/18 10:53 PM  Result Value Ref Range   Lactic Acid, Venous 2.6 (HH) 0.5 - 1.9 mmol/L    Comment: CRITICAL RESULT CALLED TO, READ BACK BY AND VERIFIED WITH: Elmin Wiederholt,M RN 11/20/2018 2339 JORDANS Performed at [facility], [place] [address], [place], [place] [address]   Urinalysis, Routine w reflex microscopic     Status: Abnormal   Collection Time: 11/20/18 11:45 PM  Result Value Ref Range   Color, Urine AMBER (A) YELLOW    Comment: BIOCHEMICALS MAY BE AFFECTED BY COLOR   APPearance CLEAR CLEAR   Specific Gravity, Urine 1.019 1.005 - 1.030   pH 7.0 5.0 - 8.0   Glucose, UA NEGATIVE NEGATIVE mg/dL   Hgb urine dipstick NEGATIVE NEGATIVE   Bilirubin Urine NEGATIVE NEGATIVE   Ketones, ur NEGATIVE NEGATIVE mg/dL   Protein, ur NEGATIVE NEGATIVE mg/dL   Nitrite NEGATIVE NEGATIVE   Leukocytes,Ua NEGATIVE NEGATIVE    Comment: Performed at [facility] [address], [place], [place] [address]  SARS Coronavirus 2 (CEPHEID- Performed in [facility] hospital lab), Hosp Order     Status: None   Collection Time: 11/21/18 12:27 AM   Specimen: Nasopharyngeal Swab  Result Value Ref Range   SARS Coronavirus 2 NEGATIVE NEGATIVE    Comment: (NOTE) If result is NEGATIVE SARS-CoV-2 target nucleic acids are NOT DETECTED. The SARS-CoV-2 RNA is generally detectable in upper and lower  respiratory specimens during the acute phase of  infection. The lowest  concentration of SARS-CoV-2 viral copies this assay can detect is 250  copies / mL. A negative result does not preclude SARS-CoV-2 infection  and should not be used as the sole basis for treatment or other  patient management decisions.  A negative result may occur with  improper specimen collection / handling, submission of specimen other  than nasopharyngeal swab, presence of viral mutation(s) within the  areas targeted by this assay, and inadequate number of viral copies  (<250 copies / mL). A negative result must be combined with clinical  observations, patient history, and epidemiological information. If result is POSITIVE SARS-CoV-2 target nucleic acids are DETECTED. The SARS-CoV-2 RNA is generally detectable in upper and lower  respiratory specimens dur ing the acute phase of infection.  Positive  results are indicative of active infection with SARS-CoV-2.  Clinical  correlation with patient history and other diagnostic information is  necessary to determine patient infection status.  Positive results do  not rule out bacterial infection or co-infection with other viruses. If result is PRESUMPTIVE POSTIVE SARS-CoV-2 nucleic acids MAY BE PRESENT.   A presumptive positive result was obtained on the submitted specimen  and confirmed on repeat testing.  While 2019 novel coronavirus  (SARS-CoV-2) nucleic acids may be present in the submitted sample  additional confirmatory testing may be necessary for epidemiological  and / or clinical management purposes  to differentiate between  SARS-CoV-2 and other Sarbecovirus currently known to infect humans.  If clinically indicated additional testing with an alternate test  methodology [phone]) is advised. The SARS-CoV-2 RNA is generally  detectable in upper and lower respiratory sp ecimens during the acute  phase of infection. The expected result is  Negative. Fact Sheet for Patients:   StrictlyIdeas.no Fact Sheet for Healthcare Providers: BankingDealers.co.za This test is not yet approved or cleared by the [place] FDA and has been authorized for detection and/or diagnosis of SARS-CoV-2 by FDA under an Emergency Use Authorization (EUA).  This EUA will remain in effect (meaning this test can be used) for the duration of the COVID-19 declaration under Section 564(b)(1) of the Act, 21 U.S.C. section 360bbb-3(b)(1), unless the authorization is terminated or revoked sooner. Performed at [facility], [place] [address] [place], [place] [address]    Dg Chest Port 1 View  Result Date: 11/20/2018 CLINICAL DATA:  Shortness of breath with vomiting EXAM: PORTABLE CHEST 1 VIEW COMPARISON:  09/22/2018, 08/27/2018 FINDINGS: Low lung volumes. Streaky atelectasis or minimal infiltrate at the left base. Stable cardiomediastinal silhouette. No pneumothorax. Mild chronic interstitial lung disease. IMPRESSION: Low lung volumes. Streaky atelectasis or minimal infiltrate at the left base. Electronically Signed   By: Donavan Foil M.D.   On: 11/20/2018 23:15    Pending Labs Unresulted Labs (From admission, onward)    Start     Ordered   11/20/18 2303  Urine culture  ONCE - STAT,   STAT     11/20/18 2303   11/20/18 2303  Blood culture (routine x 2)  BLOOD CULTURE X 2,   STAT     11/20/18 2303   Signed and Held  CBC  (heparin)  Once,   R    Comments: Baseline for heparin therapy IF NOT ALREADY DRAWN.  Notify MD if PLT < 100 K.    Signed and Held   Signed and Held  Creatinine, serum  (heparin)  Once,   R    Comments: Baseline for heparin therapy IF NOT ALREADY DRAWN.    Signed and Held   Signed and Held  Lactic acid, plasma  ONCE - STAT,   R     Signed and Held   Signed and Held  CK  Once,   R     Signed and Held   Signed and Held  Comprehensive metabolic panel  Once,   R     Signed and Held   Signed and Held  CBC with  Differential/Platelet  Once,   R     Signed and Held   Signed and Held  Comprehensive metabolic panel  Tomorrow morning,   R     Signed and Held   Visual merchandiser and Held  CBC  Tomorrow morning,   R     Signed and Held   Signed and Held  TSH  Once,   R     Signed and Held          Vitals/Pain Today's Vitals   11/21/18 0200 11/21/18 0210 11/21/18 0250 11/21/18 0251  BP: 100/79 100/79 116/63 116/63  Pulse: 97 100 91 92  Resp: (!) 32 (!) 28 (!) 26 (!) 28  Temp:      TempSrc:      SpO2: 95% 94% 95% 94%  Weight:      Height:      PainSc:        Isolation Precautions No active isolations  Medications Medications  sodium chloride 0.9 % bolus 1,000 mL (0 mLs Intravenous Stopped 11/21/18 0000)  acetaminophen (TYLENOL) tablet 1,000 mg (1,000 mg Oral Given 11/21/18 0057)  ceFEPIme (MAXIPIME) 2 g in sodium chloride 0.9 % 100 mL IVPB (0 g Intravenous Stopped 11/21/18 0145)  vancomycin (VANCOCIN) 2,000 mg in sodium  chloride 0.9 % 500 mL IVPB (0 mg Intravenous Stopped 11/21/18 0300)    Mobility walks Low fall risk   Focused Assessments Pulmonary Assessment Handoff:  Lung sounds: Bilateral Breath Sounds: Clear, Diminished L Breath Sounds: Clear, Diminished R Breath Sounds: Clear, Diminished O2 Device: Room Air        R Recommendations: See Admitting Provider Note  Report given to:   Additional Notes:

## 2018-11-21 NOTE — Progress Notes (Signed)
Pharmacy Antibiotic Note  Holly Mueller is a 53 y.o. female admitted on 11/20/2018 with pneumonia.  Pharmacy has been consulted for vancomycin and cefepime dosing.She received 2gm cefepime and 2gm vancomycin earlier  Plan: Continue vancomycin 1750 mg IV q12 hours Continue cefepime 2gm IV q8 hours F/u renal function, cultures and clinical course  Height: 5' 6" (167.6 cm) Weight: 232 lb 12.9 oz (105.6 kg) IBW/kg (Calculated) : 59.3  Temp (24hrs), Avg:98.9 F (37.2 C), Min:98.7 F (37.1 C), Max:99.3 F (37.4 C)  Recent Labs  Lab 11/20/18 2253  WBC 1.8*  CREATININE 0.60  LATICACIDVEN 2.6*    Estimated Creatinine Clearance: 99.9 mL/min (by C-G formula based on SCr of 0.6 mg/dL).    Allergies  Allergen Reactions  . Aspirin Nausea Only    Upset stomach     Thank you for allowing pharmacy to be a part of this patient's care.  Excell Seltzer Poteet 11/21/2018 7:14 AM

## 2018-11-22 DIAGNOSIS — K745 Biliary cirrhosis, unspecified: Secondary | ICD-10-CM

## 2018-11-22 DIAGNOSIS — R509 Fever, unspecified: Secondary | ICD-10-CM

## 2018-11-22 LAB — CULTURE, BLOOD (ROUTINE X 2)

## 2018-11-22 LAB — CBC
HCT: 31.1 % — ABNORMAL LOW (ref 36.0–46.0)
Hemoglobin: 10.1 g/dL — ABNORMAL LOW (ref 12.0–15.0)
MCH: 28.2 pg (ref 26.0–34.0)
MCHC: 32.5 g/dL (ref 30.0–36.0)
MCV: 86.9 fL (ref 80.0–100.0)
Platelets: 51 10*3/uL — ABNORMAL LOW (ref 150–400)
RBC: 3.58 MIL/uL — ABNORMAL LOW (ref 3.87–5.11)
RDW: 14.8 % (ref 11.5–15.5)
WBC: 1.3 10*3/uL — CL (ref 4.0–10.5)
nRBC: 0 % (ref 0.0–0.2)

## 2018-11-22 LAB — COMPREHENSIVE METABOLIC PANEL
ALT: 25 U/L (ref 0–44)
AST: 49 U/L — ABNORMAL HIGH (ref 15–41)
Albumin: 2.2 g/dL — ABNORMAL LOW (ref 3.5–5.0)
Alkaline Phosphatase: 71 U/L (ref 38–126)
Anion gap: 7 (ref 5–15)
BUN: 5 mg/dL — ABNORMAL LOW (ref 6–20)
CO2: 17 mmol/L — ABNORMAL LOW (ref 22–32)
Calcium: 8 mg/dL — ABNORMAL LOW (ref 8.9–10.3)
Chloride: 111 mmol/L (ref 98–111)
Creatinine, Ser: 0.68 mg/dL (ref 0.44–1.00)
GFR calc Af Amer: >60 mL/min (ref >60)
GFR calc non Af Amer: >60 mL/min (ref >60)
Glucose, Bld: 195 mg/dL — ABNORMAL HIGH (ref 70–99)
Potassium: 3.5 mmol/L (ref 3.5–5.1)
Sodium: 135 mmol/L (ref 135–145)
Total Bilirubin: 2.5 mg/dL — ABNORMAL HIGH (ref 0.3–1.2)
Total Protein: 6.1 g/dL — ABNORMAL LOW (ref 6.5–8.1)

## 2018-11-22 LAB — SARS CORONAVIRUS 2 BY RT PCR (HOSPITAL ORDER, PERFORMED IN ~~LOC~~ HOSPITAL LAB): SARS Coronavirus 2: NEGATIVE

## 2018-11-22 LAB — HIV ANTIBODY (ROUTINE TESTING W REFLEX): HIV Screen 4th Generation wRfx: NONREACTIVE

## 2018-11-22 MED ORDER — ALBUTEROL SULFATE HFA 108 (90 BASE) MCG/ACT IN AERS
2.0000 | INHALATION_SPRAY | RESPIRATORY_TRACT | Status: DC | PRN
  Administered 2018-11-23 (×2): 2 via RESPIRATORY_TRACT
  Filled 2018-11-22: qty 6.7

## 2018-11-22 MED ORDER — WHITE PETROLATUM EX OINT
TOPICAL_OINTMENT | CUTANEOUS | Status: AC
  Administered 2018-11-22: 1
  Filled 2018-11-22: qty 28.35

## 2018-11-22 NOTE — Evaluation (Signed)
Physical Therapy Evaluation Patient Details Name: Holly Mueller MRN: [id number] DOB: 1966/02/05 Today's Date: 11/22/2018   History of Present Illness  Pt is a 53 y/o female admitted secondary to fever and SOB. Thought to be secondary to respiratory tract infection. COVID negative. Pt also with Pancytopenia. PMH includes DM and GERD.   Clinical Impression  Pt admitted secondary to problem above with deficits below. Pt limited this session secondary to SOB and coughing spell at end of gait. Pt's oxygen sats decreasing to 85% on RA during gait and required seated rest and pursed lip breathing to return to 90%. Discussed energy conservation techniques with pt. Will continue to follow acutely to maximize functional mobility independence and safety.     Follow Up Recommendations Home health PT;Supervision for mobility/OOB    Equipment Recommendations  Other (comment)(rollator)    Recommendations for Other Services       Precautions / Restrictions Precautions Precautions: Fall;Other (comment) Precaution Comments: watch O2 sats Restrictions Weight Bearing Restrictions: No      Mobility  Bed Mobility Overal bed mobility: Modified Independent                Transfers Overall transfer level: Needs assistance Equipment used: None Transfers: Sit to/from Stand;Stand Pivot Transfers Sit to Stand: Min guard Stand pivot transfers: Min guard       General transfer comment: Min guard for safety to stand and transfer to [facility]. Pt very guarded during transfer and reaching for rails on BSC.   Ambulation/Gait Ambulation/Gait assistance: Min guard Gait Distance (Feet): 15 Feet Assistive device: IV Pole Gait Pattern/deviations: Step-through pattern;Decreased stride length Gait velocity: Decreased    General Gait Details: Slow, mildly unsteady gait. Using IV pole for steadying. SOB noted and pt coughing at the end of gait requiring seated rest. Oxygen sats at 85% on RA following  gait and required seated rest and pursed lip breathing to return to 90%.   Stairs            Wheelchair Mobility    Modified Rankin (Stroke Patients Only)       Balance Overall balance assessment: Needs assistance Sitting-balance support: No upper extremity supported;Feet supported Sitting balance-Leahy Scale: Good     Standing balance support: No upper extremity supported;Single extremity supported;During functional activity Standing balance-Leahy Scale: Fair Standing balance comment: Able to maintain static standing without UE support                              Pertinent Vitals/Pain Pain Assessment: No/denies pain    Home Living Family/patient expects to be discharged to:: Private residence Living Arrangements: Children;Non-relatives/Friends Available Help at Discharge: Family;Available 24 hours/day Type of Home: House Home Access: Stairs to enter Entrance Stairs-Rails: None Entrance Stairs-Number of Steps: 2 Home Layout: One level Home Equipment: Walker - 2 wheels;Bedside commode      Prior Function Level of Independence: Independent               Hand Dominance        Extremity/Trunk Assessment   Upper Extremity Assessment Upper Extremity Assessment: Generalized weakness    Lower Extremity Assessment Lower Extremity Assessment: Generalized weakness    Cervical / Trunk Assessment Cervical / Trunk Assessment: Normal  Communication   Communication: No difficulties  Cognition Arousal/Alertness: Awake/alert Behavior During Therapy: WFL for tasks assessed/performed Overall Cognitive Status: Within Functional Limits for tasks assessed  General Comments      Exercises     Assessment/Plan    PT Assessment Patient needs continued PT services  PT Problem List Decreased strength;Decreased balance;Decreased activity tolerance;Decreased mobility;Cardiopulmonary status limiting  activity;Decreased knowledge of use of DME       PT Treatment Interventions DME instruction;Gait training;Stair training;Therapeutic exercise;Functional mobility training;Therapeutic activities;Balance training;Patient/family education    PT Goals (Current goals can be found in the Care Plan section)  Acute Rehab PT Goals Patient Stated Goal: to feel better PT Goal Formulation: With patient Time For Goal Achievement: 12/06/18 Potential to Achieve Goals: Good    Frequency Min 3X/week   Barriers to discharge        Co-evaluation               AM-PAC PT "6 Clicks" Mobility  Outcome Measure Help needed turning from your back to your side while in a flat bed without using bedrails?: None Help needed moving from lying on your back to sitting on the side of a flat bed without using bedrails?: None Help needed moving to and from a bed to a chair (including a wheelchair)?: A Little Help needed standing up from a chair using your arms (e.g., wheelchair or bedside chair)?: A Little Help needed to walk in hospital room?: A Little Help needed climbing 3-5 steps with a railing? : A Lot 6 Click Score: 19    End of Session   Activity Tolerance: Treatment limited secondary to medical complications (Comment);Patient limited by fatigue(decreased oxygen sats) Patient left: in chair;with call bell/phone within reach Nurse Communication: Mobility status;Other (comment)(decreased oxygen sats) PT Visit Diagnosis: Difficulty in walking, not elsewhere classified (R26.2);Other abnormalities of gait and mobility (R26.89)    Time: 1207-1227 PT Time Calculation (min) (ACUTE ONLY): 20 min   Charges:   PT Evaluation $PT Eval Moderate Complexity: [place], PT, DPT  Acute Rehabilitation Services  Pager: [phone] Office: [phone]   Rudean Hitt 11/22/2018, 2:13 PM

## 2018-11-22 NOTE — Progress Notes (Signed)
Family Medicine Teaching Service Daily Progress Note Intern Pager: [phone]  Patient name: Holly Mueller Medical record number: [id number] Date of birth: 1965-09-23      Age: 53 y.o.    Gender: female  Primary Care Provider: Cleophas Dunker, DO Consultants: GI Code Status: Full  Pt Overview and Major Events to Date:  07/13- admitted  Assessment and Plan:  Resp Tract Infection No increasing SOB or chest pain. O2 sats 94 on room air.  Vital signs stable. Started Cefepime and Vancomycin 07/13  COVID -ve Continue to monitor resp status and maintain O2 sat>92% Monitor for increasing temperature Monitor CBC daily  Blood cultures 07/12 positive MRSA, pt is currently taking Vancomycin IV   Gastric Varices  Pt reports no signs of bleeding. She is currently on Protonix GI is following and plan to reschedule EGD/EUS.  Lactulose started fror mild asterixes Will f/u with Dr. Carlean Purl in am for further recs F/U ammonia level  Type II DM Well controlled, last HbA1C 6.2 07/26/2018 CBG 195 today Continue to monitor CBG with morning labs  GERD Asymptomatic, currently taking Protonix  Interstitial Lung Disease No worsening SOB, home med Asmanex prn bronchodilators   Hip Pain No complaints of pain. Monitor for increasing pain, tylenol prn  Chronic Bicytopenia likely secondary to PBC Platelets 51 today decreased from yesterday WBC 1.3 today remains the same Monitor CBC daily Discontinue s/c heparin due to low platelets  FEN/GI: clear liquid diet, Protonix  PPx: SCD's  Disposition: awaiting GI recs  Subjective:Pt seen and assessed. She reports feeling somewhat better  today. No worsening SOB but does have an occasional nonproductive cough. Denies any chest pain, abdo pain.  Pt states she has no decrease in appetite. Voiding well, bowels moved yesterday.  Objective: Temp:  [98.7 F (37.1 C)-99.3 F (37.4 C)] 98.8 F (37.1 C) (07/13 0539) Pulse Rate:   [91-114] 93 (07/13 0539) Resp:  [21-32] 22 (07/13 0539) BP: (100-137)/(63-80) 122/65 (07/13 0539) SpO2:  [89 %-96 %] 92 % (07/13 0819) Weight:  [105.6 kg-106.6 kg] 105.6 kg (07/13 0536) Physical Exam: General: pleasant lady lying in bed in NAD Cardiovascular: RRR, no murmurs appreciated Respiratory:CTAB, no wheezing, no rhonchi noted, on room air and no increased work of breathing Abdomen: soft, no pain, no distension Extremities:pulses present, mild lower extremity noted bilaterally  Laboratory:  Last Labs       Recent Labs  Lab 11/20/18 2253 11/21/18 0611  WBC 1.8* 1.3*  HGB 12.1 10.9*  HCT 36.8 33.8*  PLT 59* 58*     Last Labs       Recent Labs  Lab 11/20/18 2253 11/21/18 0611  NA 139 138  K 3.6 3.4*  CL 111 112*  CO2 21* 19*  BUN <5* <5*  CREATININE 0.60 0.54  CALCIUM 8.6* 8.2*  PROT 7.3 6.6  BILITOT 2.5* 2.6*  ALKPHOS 115 93  ALT 30 28  AST 58* 51*  GLUCOSE 166* 95        Imaging/Diagnostic Tests: Dg Chest Port 1 View  Result Date: 11/20/2018 CLINICAL DATA:  Shortness of breath with vomiting EXAM: PORTABLE CHEST 1 VIEW COMPARISON:  09/22/2018, 08/27/2018 FINDINGS: Low lung volumes. Streaky atelectasis or minimal infiltrate at the left base. Stable cardiomediastinal silhouette. No pneumothorax. Mild chronic interstitial lung disease. IMPRESSION: Low lung volumes. Streaky atelectasis or minimal infiltrate at the left base. Electronically Signed   By: Donavan Foil M.D.   On: 11/20/2018 23:15    Carollee Leitz, MD 11/21/2018, 9:46 AM PGY-1,  [place] Intern pager: [phone], text pages welcome
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
## 2018-11-22 NOTE — Progress Notes (Signed)
CRITICAL VALUE ALERT  Critical Value:  Wbc 1.3  Date & Time Notied:  11/22/2018, 3:40am  Provider Notified: Dr. Jarome Matin  Orders Received/Actions taken: awaiting

## 2018-11-22 NOTE — Progress Notes (Addendum)
Family Medicine Teaching Service Daily Progress Note Intern Pager: [phone]  Patient name: Holly Mueller Medical record number: [id number] Date of birth: April 10, 1966      Age: 53 y.o.    Gender: female  Primary Care Provider: Cleophas Dunker, DO Consultants: GI Code Status: Full  Pt Overview and Major Events to Date:  07/13- admitted  Assessment and Plan:  Resp Tract Infection Pt reports body aches this morning, no increasing SOB or chest pain. O2 sats 92 on room air.  Vital signs stable and afebrile. Started Cefepime and Vancomycin 07/13  COVID -ve Continue to monitor resp status and maintain O2 sat>92% Monitor for increasing temperature F/u with influenza screening Monitor CBC daily  LA trend 1.4/2.6  Gastric Varices  Pt reports no signs of bleeding. She is currently on Protonix GI is following and plan to reschedule EGD/EUS.  Lactulose started fror mild asterixes Will f/u with Dr. Carlean Purl in am for further recs F/U ammonia level  Type II DM Well controlled, last HbA1C 6.2 07/26/2018 CBG 95 today Continue to monitor CBG with morning labs  GERD Asymptomatic, currently taking Protonix  Interstitial Lung Disease No worsening SOB, on home med Asmanex  Hip Pain No complaints of pain. Monitor for increasing pain, tylenol prn  Chronic Bicytopenia likely secondary to PBC Platelets 58 today  WBC 1.3 today Monitor CBC daily Discontinue s/c heparin due to low platelets  FEN/GI: clear liquid diet, Protonix  PPx: SCD's  Disposition: awaiting GI recs  Subjective:  Pt seen and assessed. She complains of generalized aches today. No worsening SOB. Denies any chest pain, abdo pain.  Pt states she has no decrease in appetite.  Voiding well, bowels moved yesterday.  Objective: Temp:  [98.7 F (37.1 C)-99.3 F (37.4 C)] 98.8 F (37.1 C) (07/13 0539) Pulse Rate:  [91-114] 93 (07/13 0539) Resp:  [21-32] 22 (07/13 0539) BP: (100-137)/(63-80)  122/65 (07/13 0539) SpO2:  [89 %-96 %] 92 % (07/13 0819) Weight:  [105.6 kg-106.6 kg] 105.6 kg (07/13 0536) Physical Exam: General: pt lying in bed in NAD Cardiovascular: RRR, no murmurs appreciated Respiratory: no increased work of breathing, no wheezing, no crackles notes Abdomen: soft, nontender, non distended, BS present  Extremities: minimal lower extremity edema, pulses present  Laboratory: Last Labs       Recent Labs  Lab 11/20/18 2253 11/21/18 0611  WBC 1.8* 1.3*  HGB 12.1 10.9*  HCT 36.8 33.8*  PLT 59* 58*     Last Labs       Recent Labs  Lab 11/20/18 2253 11/21/18 0611  NA 139 138  K 3.6 3.4*  CL 111 112*  CO2 21* 19*  BUN <5* <5*  CREATININE 0.60 0.54  CALCIUM 8.6* 8.2*  PROT 7.3 6.6  BILITOT 2.5* 2.6*  ALKPHOS 115 93  ALT 30 28  AST 58* 51*  GLUCOSE 166* 95        Imaging/Diagnostic Tests: Dg Chest Port 1 View  Result Date: 11/20/2018 CLINICAL DATA:  Shortness of breath with vomiting EXAM: PORTABLE CHEST 1 VIEW COMPARISON:  09/22/2018, 08/27/2018 FINDINGS: Low lung volumes. Streaky atelectasis or minimal infiltrate at the left base. Stable cardiomediastinal silhouette. No pneumothorax. Mild chronic interstitial lung disease. IMPRESSION: Low lung volumes. Streaky atelectasis or minimal infiltrate at the left base. Electronically Signed   By: Donavan Foil M.D.   On: 11/20/2018 23:15    Carollee Leitz, MD 11/21/2018, 9:46 AM PGY-1, [place] Intern pager: [phone], text pages welcome

## 2018-11-23 DIAGNOSIS — I864 Gastric varices: Secondary | ICD-10-CM

## 2018-11-23 LAB — CBC
HCT: 30.4 % — ABNORMAL LOW (ref 36.0–46.0)
Hemoglobin: 9.8 g/dL — ABNORMAL LOW (ref 12.0–15.0)
MCH: 28 pg (ref 26.0–34.0)
MCHC: 32.2 g/dL (ref 30.0–36.0)
MCV: 86.9 fL (ref 80.0–100.0)
Platelets: 50 10*3/uL — ABNORMAL LOW (ref 150–400)
RBC: 3.5 MIL/uL — ABNORMAL LOW (ref 3.87–5.11)
RDW: 15.1 % (ref 11.5–15.5)
WBC: 1 10*3/uL — CL (ref 4.0–10.5)
nRBC: 0 % (ref 0.0–0.2)

## 2018-11-23 LAB — COMPREHENSIVE METABOLIC PANEL
ALT: 25 U/L (ref 0–44)
AST: 51 U/L — ABNORMAL HIGH (ref 15–41)
Albumin: 2.1 g/dL — ABNORMAL LOW (ref 3.5–5.0)
Alkaline Phosphatase: 78 U/L (ref 38–126)
Anion gap: 6 (ref 5–15)
BUN: 5 mg/dL — ABNORMAL LOW (ref 6–20)
CO2: 20 mmol/L — ABNORMAL LOW (ref 22–32)
Calcium: 8 mg/dL — ABNORMAL LOW (ref 8.9–10.3)
Chloride: 111 mmol/L (ref 98–111)
Creatinine, Ser: 0.6 mg/dL (ref 0.44–1.00)
GFR calc Af Amer: >60 mL/min (ref >60)
GFR calc non Af Amer: >60 mL/min (ref >60)
Glucose, Bld: 129 mg/dL — ABNORMAL HIGH (ref 70–99)
Potassium: 3.3 mmol/L — ABNORMAL LOW (ref 3.5–5.1)
Sodium: 137 mmol/L (ref 135–145)
Total Bilirubin: 1.8 mg/dL — ABNORMAL HIGH (ref 0.3–1.2)
Total Protein: 5.8 g/dL — ABNORMAL LOW (ref 6.5–8.1)

## 2018-11-23 MED ORDER — POTASSIUM CHLORIDE CRYS ER 20 MEQ PO TBCR
40.0000 meq | EXTENDED_RELEASE_TABLET | Freq: Two times a day (BID) | ORAL | Status: AC
  Administered 2018-11-23 (×2): 40 meq via ORAL
  Filled 2018-11-23 (×2): qty 2

## 2018-11-23 NOTE — Progress Notes (Addendum)
Daily Rounding Note  11/23/2018, 9:13 AM  LOS: 2 days   SUBJECTIVE:   Chief complaint:      2nd Covid 19 test yesterday 7/14 was negative, as was the 1st test on 7/13. No further nausea or vomiting.  Tolerating diet better.  Altered taste sensation improved.  Had a bowel movement this morning, brown in color.  Minor pain at the umbilicus.  This is been an intermittent, long-term issue.   Asking if she has to be on the heart healthy diet as she wants to have a biscuit but apparently that is not allowed on a heart healthy diet.  OBJECTIVE:         Vital signs in last 24 hours:    Temp:  [98.3 F (36.8 C)-99.7 F (37.6 C)] 98.3 F (36.8 C) (07/15 0710) Pulse Rate:  [74-90] 74 (07/15 0710) Resp:  [18-22] 22 (07/15 0710) BP: (107-116)/(63-78) 116/78 (07/15 0710) SpO2:  [95 %-96 %] 96 % (07/15 0710) Last BM Date: 11/22/18 Filed Weights   11/20/18 2214 11/21/18 0536  Weight: 106.6 kg 105.6 kg   General: Looks better, affect brighter.  Alert, comfortable. Heart: RRR. Chest: Somewhat diminished breath sounds but clear.  No labored breathing or cough. Abdomen: Obese.  Soft.  Nontender.  Active bowel sounds.  Umbilical hernia repair scar.  No recurrent hernia. Extremities: No CCE. Neuro/Psych: Pleasant, cooperative.  No gross deficits.  No tremor.   Lab Results: Recent Labs    11/21/18 0611 11/22/18 0115 11/23/18 0519  WBC 1.3* 1.3* 1.0*  HGB 10.9* 10.1* 9.8*  HCT 33.8* 31.1* 30.4*  PLT 58* 51* 50*   BMET Recent Labs    11/21/18 0611 11/22/18 0115 11/23/18 0519  NA 138 135 137  K 3.4* 3.5 3.3*  CL 112* 111 111  CO2 19* 17* 20*  GLUCOSE 95 195* 129*  BUN <5* 5* 5*  CREATININE 0.54 0.68 0.60  CALCIUM 8.2* 8.0* 8.0*   LFT Recent Labs    11/21/18 0611 11/22/18 0115 11/23/18 0519  PROT 6.6 6.1* 5.8*  ALBUMIN 2.5* 2.2* 2.1*  AST 51* 49* 51*  ALT _0 ALKPHOS 93 71 78  BILITOT 2.6* 2.5* 1.8*     Studies/Results: No results found.  Scheduled Meds: . budesonide (PULMICORT) nebulizer solution  0.5 mg Nebulization BID  . fluticasone  2 spray Each Nare Daily  . levothyroxine  250 mcg Oral Q0600  . loratadine  10 mg Oral Daily  . pantoprazole  40 mg Oral BID AC  . ursodiol  600 mg Oral BID   Continuous Infusions: . sodium chloride Stopped (11/22/18 1530)  . ceFEPime (MAXIPIME) IV 2 g (11/23/18 0548)  . vancomycin 1,750 mg (11/23/18 0122)   PRN Meds:.sodium chloride, acetaminophen **OR** acetaminophen, albuterol, promethazine   ASSESMENT:   *  PBC with cirrhosis.   On ursodiol.  *   Hypokalemia.    *   Pancytopenia Worsening leukopenia.  Hgb 10.9 >>  10.1 >> 9.8  *   ILD.  Infiltrate vs atx at Left lung base. Day 3 Vanco, Cefepime.    *   Gastric variceal bleed 09/2018.  Treated with cyanoacrylate ablation at [facility].  Miniscule blood in emesis PTA.   Surveillance EGD/EUS to be rescheduled  *   Cough, altered taste sensation.  Covid 19 negative x 2.     PLAN   *    No change in current plans.    Judson Roch  [place]  11/23/2018, 9:13 AM Phone [place] Attending   I have taken an interval history, reviewed the chart and examined the patient. I agree with the Advanced Practitioner's note, impression and recommendations.     She is stable from a GI standpoint.  We will arrange for her to have her endoscopic ultrasound with Dr. Stefani Dama Roddy later this month or in August.  I need to set that up through our office and we will make sure to let her know.  This is to reassess her previously cyanoacrylate injected gastric varix.  Signing off Gatha Mayer, MD, [facility] 11/23/2018 4:35 PM Pager [phone]

## 2018-11-23 NOTE — Progress Notes (Signed)
Family Medicine Teaching Service Daily Progress Note Intern Pager: [phone]  Patient name: Holly Mueller Medical record number: [id number] Date of birth: 09/17/1965 Age: 53 y.o. Gender: female  Primary Care Provider: Cleophas Dunker, DO Consultants: GI Code Status: Full  Pt Overview and Major Events to Date:  07/13 Admitted  Assessment and Plan:  Rep Tract Infection COVID negative x2 Breathing improving, no oxygen supplementation required.  Afebrile overnight. Transition to po Doxycycline 13m daily for 6 day  Gastric Varicies Asymptomatic Per GI recs-  EGD/EUS for 7/13 now to be done in a few weeks, Dr MRush Landmarkworking on timing.   Has now signed off  Type II DM Last HbA1c  6.2 03/17 CBG 107 yesterday 129 Monitor daily with labs  Protein Calorie Malnutrition Albumin  2.0, yesterday 2.1  GERD Asymptomatic, currently on Protonix  ILD Prn bronchodilators Home med Asmanex  Hip Pain Resolving Tylenol prn  Chronic Bicytopenia secondary to PBC Monitor CBC and platelets daily No heparin for dvt due to chronic thrombocytopenia  FEN/GI: Clear liquid diet, Protonix PPx:SCD's  Disposition:Face to face consulted. Plan for discharge today. Subjective:  Pt seen and assessed. Reports feeling better.  Still has cough and states that is getting better. She reports no anosmia or aguesia today.  She reports that her breathing is better and she is currently on room air.  She denies and chest pain or leg swelling.  Appetite better and drinking well.  No complains abd pain today.     Objective: Temp:  [98.2 F (36.8 C)-99 F (37.2 C)] 98.4 F (36.9 C) (07/15 2036) Pulse Rate:  [74-80] 80 (07/15 2036) Resp:  [18-22] 18 (07/15 2036) BP: (107-128)/(66-78) 128/75 (07/15 2036) SpO2:  [96 %-98 %] 97 % (07/15 2036)   General: Alert and oriented, no apparent distress  Neck: nontender Cardiovascular: RRR with no murmurs noted Respiratory: no crackles, no wheezing,  occasional dry cough  Gastrointestinal: Bowel sounds present, tenderness on palpation across periumbilical area, no rebound tenderness, no rigidity MSK: good strength Derm: No rashes noted Neuro: A&Ox4  Psych: Behavior and speech appropriate to situation  Laboratory: Recent Labs  Lab 11/21/18 0611 11/22/18 0115 11/23/18 0519  WBC 1.3* 1.3* 1.0*  HGB 10.9* 10.1* 9.8*  HCT 33.8* 31.1* 30.4*  PLT 58* 51* 50*   Recent Labs  Lab 11/21/18 0611 11/22/18 0115 11/23/18 0519  NA 138 135 137  K 3.4* 3.5 3.3*  CL 112* 111 111  CO2 19* 17* 20*  BUN <5* 5* 5*  CREATININE 0.54 0.68 0.60  CALCIUM 8.2* 8.0* 8.0*  PROT 6.6 6.1* 5.8*  BILITOT 2.6* 2.5* 1.8*  ALKPHOS 93 71 78  ALT _0 AST 51* 49* 51*  GLUCOSE 95 195* 129*      Imaging/Diagnostic Tests:  WCarollee Leitz MD 11/23/2018, 10:27 PM PGY-1, [place] pager: [phone] text pages welcome

## 2018-11-23 NOTE — Progress Notes (Signed)
   11/23/18 1210  Clinical Encounter Type  Visited With Patient  Visit Type Initial;Psychological support;Spiritual support  Referral From Physician  Consult/Referral To Chaplain  This chaplain was pastorally present with the Pt.  The chaplain listened to the Pt. as she described the ongoing challenges of  balancing her health, family, and housing.  The Pt. appreciated the opportunity to talk out loud and be heard. The chaplain heard the Pt. faith intersect the challenges as a positive resource.  The Pt. accepted the opportunity for prayer with the chaplain and a return spiritual care visit.

## 2018-11-23 NOTE — Progress Notes (Addendum)
Patient will be taken off precautions for COVID and transferred of covid suspicion patient floor.  There was report from gastroenterology yesterday about change in taste sensation which could be possible COVID symptom.  We had another test which is now been negative, no new respiratory complaints overnight, and patient clarified that she does not actually have a change in sensation or taste but that she loses some her appetite when she does not feel well.  Dr. Criss Rosales

## 2018-11-23 NOTE — Progress Notes (Signed)
Family Medicine Teaching Service Daily Progress Note Intern Pager: [phone]  Patient name: Holly Mueller Medical record number: [id number] Date of birth: May 15, 1965 Age: 53 y.o. Gender: female  Primary Care Provider: Cleophas Dunker, DO Consultants: GI Code Status: Full  Pt Overview and Major Events to Date:  07/13 Admitted  Assessment and Plan:  Rep Tract Infection COVID negative x2 Breathing improving, no oxygen supplementation required.  Afebrile overnight. Continue IV antibiotics for 24 hrs and transition to po in am  Gastric Varicies asymptomatic Per GI recs-  EGD/EUS for 7/13 now to be done in a few weeks, Dr Rush Landmark working on timing.     Type II DM Last HbA1c  6.2 03/17 CBG 129 yesterday 195 Monitor daily with labs  Protein Calorie Malnutrition Albumin 2.1  GERD Asymptomatic, currently on Protonix  ILD Prn bronchodilators Home med Asmanex  Hip Pain Resolving Tylenol prn  Chronic Bicytopenia secondary to PBC Monitor CBC and platelets daily No heparin for dvt due to chronic thrombocytopenia  FEN/GI: Clear liquid diet, Protonix PPx:SCD's  Disposition:when medically stable  Subjective:  Pt seen and assessed. Reports feeling better.  Still has cough and states that she hadn't had inhaler yet.  She reports no anosmia or aguesia today.  She reports that her breathing is better and she is currently on room air.  She denies and chest pain or leg swelling.  Appetite fair and drinking well.  She also complains of achy pain in umbilical area.  She reports this has been an ongoing issue since a previous surgery in that area.    Objective: Temp:  [98.3 F (36.8 C)-99.4 F (37.4 C)] 98.3 F (36.8 C) (07/15 0710) Pulse Rate:  [74-80] 74 (07/15 0710) Resp:  [18-22] 22 (07/15 0710) BP: (107-116)/(66-78) 116/78 (07/15 0710) SpO2:  [96 %] 96 % (07/15 0710) General: Alert and oriented, no apparent distress  Neck: nontender Cardiovascular: RRR with  no murmurs noted Respiratory: no crackles, no wheezing, occasional dry cough  Gastrointestinal: Bowel sounds present, tenderness on palpation across periumbilical area, no rebound tenderness, no rigidity MSK: good strength Derm: No rashes noted Neuro: A&Ox4  Psych: Behavior and speech appropriate to situation  Laboratory: Recent Labs  Lab 11/21/18 0611 11/22/18 0115 11/23/18 0519  WBC 1.3* 1.3* 1.0*  HGB 10.9* 10.1* 9.8*  HCT 33.8* 31.1* 30.4*  PLT 58* 51* 50*   Recent Labs  Lab 11/21/18 0611 11/22/18 0115 11/23/18 0519  NA 138 135 137  K 3.4* 3.5 3.3*  CL 112* 111 111  CO2 19* 17* 20*  BUN <5* 5* 5*  CREATININE 0.54 0.68 0.60  CALCIUM 8.2* 8.0* 8.0*  PROT 6.6 6.1* 5.8*  BILITOT 2.6* 2.5* 1.8*  ALKPHOS 93 71 78  ALT _0 AST 51* 49* 51*  GLUCOSE 95 195* 129*      Imaging/Diagnostic Tests:  Carollee Leitz, MD 11/23/2018, 2:53 PM PGY-1, [place] Intern pager: [phone], text pages welcome

## 2018-11-24 LAB — CBC
HCT: 30.2 % — ABNORMAL LOW (ref 36.0–46.0)
Hemoglobin: 9.8 g/dL — ABNORMAL LOW (ref 12.0–15.0)
MCH: 27.8 pg (ref 26.0–34.0)
MCHC: 32.5 g/dL (ref 30.0–36.0)
MCV: 85.8 fL (ref 80.0–100.0)
Platelets: 57 10*3/uL — ABNORMAL LOW (ref 150–400)
RBC: 3.52 MIL/uL — ABNORMAL LOW (ref 3.87–5.11)
RDW: 15.2 % (ref 11.5–15.5)
WBC: 1.1 10*3/uL — CL (ref 4.0–10.5)
nRBC: 0 % (ref 0.0–0.2)

## 2018-11-24 LAB — COMPREHENSIVE METABOLIC PANEL
ALT: 24 U/L (ref 0–44)
AST: 48 U/L — ABNORMAL HIGH (ref 15–41)
Albumin: 2 g/dL — ABNORMAL LOW (ref 3.5–5.0)
Alkaline Phosphatase: 95 U/L (ref 38–126)
Anion gap: 7 (ref 5–15)
BUN: <5 mg/dL — ABNORMAL LOW (ref 6–20)
CO2: 19 mmol/L — ABNORMAL LOW (ref 22–32)
Calcium: 8.3 mg/dL — ABNORMAL LOW (ref 8.9–10.3)
Chloride: 113 mmol/L — ABNORMAL HIGH (ref 98–111)
Creatinine, Ser: 0.54 mg/dL (ref 0.44–1.00)
GFR calc Af Amer: >60 mL/min (ref >60)
GFR calc non Af Amer: >60 mL/min (ref >60)
Glucose, Bld: 107 mg/dL — ABNORMAL HIGH (ref 70–99)
Potassium: 4 mmol/L (ref 3.5–5.1)
Sodium: 139 mmol/L (ref 135–145)
Total Bilirubin: 1.5 mg/dL — ABNORMAL HIGH (ref 0.3–1.2)
Total Protein: 5.9 g/dL — ABNORMAL LOW (ref 6.5–8.1)

## 2018-11-24 MED ORDER — DOXYCYCLINE HYCLATE 100 MG PO TABS: 100.0000 mg | ORAL_TABLET | Freq: Two times a day (BID) | ORAL | Status: DC

## 2018-11-24 MED ORDER — DOXYCYCLINE HYCLATE 100 MG PO TABS: 100.0000 mg | ORAL_TABLET | Freq: Two times a day (BID) | ORAL | 0 refills | Status: DC

## 2018-11-24 MED ORDER — DOXYCYCLINE HYCLATE 100 MG PO TABS: 100.0000 mg | ORAL_TABLET | Freq: Two times a day (BID) | ORAL | 0 refills | Status: AC

## 2018-11-24 MED FILL — DOXYCYCLINE HYCLATE 100 MG: 100 | 3 days supply | Qty: 6 | Fill #0

## 2018-11-24 NOTE — Discharge Instructions (Signed)
If you feel any worsening of breathing or any bleeding please go to the Emergency for immediate medical attention Future Appointments  Date Time Provider [place]  11/28/2018  1:55 PM Meccariello, Bernita Raisin, DO [facility]

## 2018-11-24 NOTE — Progress Notes (Signed)
Physical Therapy Discharge Patient Details Name: Holly Mueller MRN: [id number] DOB: [birth year] Today's Date: 11/24/2018 Time:  -     Patient discharged from PT services secondary to goals met and no further PT needs identified. Observed pt amb independently in halls without difficulty. No further PT required. No HHPT required.  Please see latest therapy progress note for current level of functioning and progress toward goals.    Progress and discharge plan discussed with patient and/or caregiver: Patient/Caregiver agrees with plan  GP     [place] 11/24/2018, 11:50 AM   [place] Pager [phone] Office [phone]

## 2018-11-24 NOTE — TOC Initial Note (Signed)
Transition of Care [facility]) - Initial/Assessment Note    Patient Details  Name: Holly Mueller MRN: [id number] Date of Birth: 1966/04/29  [facility]) CM/SW Contact:    Zenon Mayo, RN Phone Number: 11/24/2018, 12:23 PM  Clinical Narrative:                 From home with children, she plans to go to her sister apartment for a while after she is discharged.  She is indep pta. Per pt eval no pt f/u needed.  She states she has a orange card and goes to [facility] to get her medications.  She states she follows up at the [facility] for follow up apts. She will have transportation on 7/17 to go to sisters house.   Expected Discharge Plan: Home/Self Care Barriers to Discharge: No Barriers Identified   Patient Goals and CMS Choice Patient states their goals for this hospitalization and ongoing recovery are:: to stay healthy   Choice offered to / list presented to : NA  Expected Discharge Plan and Services Expected Discharge Plan: Home/Self Care In-house Referral: NA Discharge Planning Services: CM Consult Post Acute Care Choice: NA Living arrangements for the past 2 months: Apartment Expected Discharge Date: 11/24/18               DME Arranged: (NA)         HH Arranged: NA          Prior Living Arrangements/Services Living arrangements for the past 2 months: Apartment Lives with:: Siblings(going to stay with sister at [place]) Patient language and need for interpreter reviewed:: Yes Do you feel safe going back to the place where you live?: Yes      Need for Family Participation in Patient Care: No (Comment) Care giver support system in place?: No (comment)   Criminal Activity/Legal Involvement Pertinent to Current Situation/Hospitalization: No - Comment as needed  Activities of Daily Living Home Assistive Devices/Equipment: None ADL Screening (condition at time of admission) Patient's cognitive ability adequate to safely complete daily  activities?: Yes Is the patient deaf or have difficulty hearing?: No Does the patient have difficulty seeing, even when wearing glasses/contacts?: No Does the patient have difficulty concentrating, remembering, or making decisions?: No Patient able to express need for assistance with ADLs?: No Does the patient have difficulty dressing or bathing?: No Independently performs ADLs?: Yes (appropriate for developmental age) Does the patient have difficulty walking or climbing stairs?: No Weakness of Legs: None Weakness of Arms/Hands: None  Permission Sought/Granted                  Emotional Assessment Appearance:: Appears stated age Attitude/Demeanor/Rapport: Engaged Affect (typically observed): Appropriate Orientation: : Oriented to Self, Oriented to Place, Oriented to  Time, Oriented to Situation Alcohol / Substance Use: Not Applicable Psych Involvement: No (comment)  Admission diagnosis:  Cough [R05] Vomiting and diarrhea [R11.10, R19.7] Fever in adult [R50.9] Patient Active Problem List   Diagnosis Date Noted  . Vomiting and diarrhea   . Primary biliary cholangitis ([place]) 10/24/2018  . Gastric varices with bleeding   . Anemia 09/22/2018  . Palpitations 09/13/2018  . Hemoptysis 08/28/2018  . Pancytopenia ([place]) 08/28/2018  . Uninsured 04/04/2018  . ILD (interstitial lung disease) ([place]) 03/23/2018  . Fever in adult 12/28/2017  . Food insecurity 10/15/2017  . Hyperglycemia 10/06/2017  . Ectatic aorta ([place]) 09/11/2017  . Left leg pain   . Type 2 diabetes mellitus without complication ([place])   .  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 Bicytopenia 10/24/2016  . GERD (gastroesophageal reflux disease) 10/23/2016  . Cough 12/09/2015  . Chronic pain of left thumb 10/09/2015  . Precordial chest pain 08/03/2015  . Bursitis of left hip 09/14/2013  . DOE (dyspnea on exertion) 08/17/2013  . Ganglion cyst of wrist 08/24/2012  . Right hip pain  08/23/2012  . Umbilical hernia 27/61/4709  . Healthcare maintenance 02/14/2012  . Osteoarthritis 02/12/2012  . Allergic rhinitis 08/26/2011  . Obesity 07/08/2010  . SEIZURES, HX OF 12/12/2009  . DENTAL CARIES 12/18/2008  . DEPRESSION 07/26/2008  . MIGRAINE HEADACHE 11/15/2007  . Hypothyroidism 10/12/2006  . Restrictive lung disease 07/30/2006   PCP:  Cleophas Dunker, DO Pharmacy:   [facility], [place] [address] [place] [address] Phone: [phone] Fax: [place], [place] - [address] [place] [address] Phone: [phone] Fax: [phone]     Social Determinants of Health (SDOH) Interventions    Readmission Risk Interventions Readmission Risk Prevention Plan 11/24/2018  Transportation Screening Complete  PCP or Specialist Appt within 3-5 Days Complete  HRI or [place] Complete  Social Work Consult for [place] Planning/Counseling Complete  Palliative Care Screening Not Applicable  Medication Review Press photographer) Complete  Some recent data might be hidden

## 2018-11-26 LAB — CULTURE, BLOOD (ROUTINE X 2): Culture: NO GROWTH

## 2018-11-27 LAB — CULTURE, BLOOD (ROUTINE X 2)
Culture: NO GROWTH
Culture: NO GROWTH

## 2018-11-27 NOTE — Discharge Summary (Signed)
[facility] Discharge Summary  Patient name: Holly Mueller Medical record number: [id number] Date of birth: 1966-01-27 Age: 53 y.o. Gender: female Date of Admission: 11/20/2018  Date of Discharge:11/24/2018 Admitting Physician: Leeanne Rio, MD  Primary Care Provider: Cleophas Dunker, DO Consultants: GI  Indication for Hospitalization: fever and cough  Discharge Diagnoses/Problem List:  Respiratory Tract Infection, likely viral Gastric Varices secondary to Cirrhosis from PBC DM Type II GERD Interstitial Lung Disease Hip Pain Chronic Bicytopenia secondary to [place]  Disposition: Home  Discharge Condition: stable  Discharge Exam:  General: Alert and oriented, no apparent distress      Neck: nontender Cardiovascular: RRR with no murmurs noted Respiratory: no crackles, no wheezing, occasional dry cough           Gastrointestinal: Bowel sounds present, tenderness on palpation across periumbilical area, no rebound tenderness, no rigidity MSK: good strength Derm: No rashes noted Neuro: A&Ox4  Psych: Behavior and speech appropriate to situation  Brief Hospital Course:  Pt was admitted for fever and cough.  COVID 19 negative x2.  Was given a course of antibiotics, vancomycin and cefepime, for questionable pneumonia.  Pt was also evaluated by GI for emesis. No invasive treatment , advise to follow up with Dr Stefani Dama Roddy in a couple of weeks.   Issues for Follow Up:  Follow up diabetes Monitor Bicytopenia, chronic leukopenia and thrombocytopenia Monitor for any signs of bleeding Follow up GI in 2 weeks for possible scope    Significant Procedures: none  Significant Labs and Imaging:  Recent Labs  Lab 11/22/18 0115 11/23/18 0519 11/24/18 0522  WBC 1.3* 1.0* 1.1*  HGB 10.1* 9.8* 9.8*  HCT 31.1* 30.4* 30.2*  PLT 51* 50* 57*   Recent Labs  Lab 11/22/18 0115 11/23/18 0519 11/24/18 0522  NA 135 137 139  K 3.5 3.3* 4.0   CL 111 111 113*  CO2 17* 20* 19*  GLUCOSE 195* 129* 107*  BUN 5* 5* <5*  CREATININE 0.68 0.60 0.54  CALCIUM 8.0* 8.0* 8.3*  ALKPHOS 71 78 95  AST 49* 51* 48*  ALT _0 ALBUMIN 2.2* 2.1* 2.0*      Results/Tests Pending at Time of Discharge: none  Discharge Medications:  Allergies as of 11/24/2018      Reactions   Aspirin Nausea Only   Upset stomach      Medication List    STOP taking these medications   insulin glargine 100 UNIT/ML injection Commonly known as: LANTUS     TAKE these medications   albuterol 108 (90 Base) MCG/ACT inhaler Commonly known as: ProAir HFA INHALE 2 PUFFS INTO THE LUNGS EVERY 6 HOURS AS NEEDED FOR SHORTNESS OF BREATH What changed:   how much to take  how to take this  when to take this  reasons to take this  additional instructions   albuterol (2.5 MG/3ML) 0.083% nebulizer solution Commonly known as: PROVENTIL Take 3 mLs (2.5 mg total) by nebulization every 6 (six) hours as needed for wheezing or shortness of breath. What changed: Another medication with the same name was changed. Make sure you understand how and when to take each.   blood glucose meter kit and supplies Dispense based on patient and insurance preference. Use up to four times daily as directed. (FOR ICD-10 E10.9, E11.9).   blood glucose meter kit and supplies Kit Dispense based on patient and insurance preference. Use up to four times daily as directed. (FOR ICD-9 250.00, 250.01).  cetirizine 10 MG tablet Commonly known as: ZYRTEC Take 1 tablet (10 mg total) by mouth daily.   fluticasone 50 MCG/ACT nasal spray Commonly known as: FLONASE Place 2 sprays into both nostrils daily.   glucose blood test strip Commonly known as: True Metrix Blood Glucose Test Use as instructed   levothyroxine 125 MCG tablet Commonly known as: SYNTHROID Take 2 tablets (250 mcg total) by mouth daily.   metFORMIN 500 MG tablet Commonly known as: GLUCOPHAGE Take 1 tablet  (500 mg total) by mouth 2 (two) times daily with a meal. What changed: how much to take   Mometasone Furoate 200 MCG/ACT Aero Commonly known as: Asmanex HFA Inhale 2 puffs into the lungs 2 (two) times daily.   pantoprazole 40 MG tablet Commonly known as: PROTONIX Take 1 tablet (40 mg total) by mouth 2 (two) times daily before a meal.   TRUEplus Lancets 28G Misc Use to check blood sugar as directed.   ursodiol 500 MG tablet Commonly known as: ACTIGALL Take 1 tablet (500 mg total) by mouth 3 (three) times daily.     ASK your doctor about these medications   doxycycline 100 MG tablet Commonly known as: VIBRA-TABS Take 1 tablet (100 mg total) by mouth every 12 (twelve) hours for 3 days. Ask about: Should I take this medication?       Discharge Instructions: Please refer to Patient Instructions section of EMR for full details.  Patient was counseled important signs and symptoms that should prompt return to medical care, changes in medications, dietary instructions, activity restrictions, and follow up appointments.   Follow-Up Appointments: Follow-up Information    Meccariello, Bernita Raisin, DO. Go on 11/28/2018.   Specialty: Family Medicine Why: 1:55pm Contact information: [address] [place] [address] [phone]        Sanda Klein, MD .   Specialty: Cardiology Contact information: [address] [place] [address] [phone]           Carollee Leitz, MD 11/28/2018, 1:43 PM PGY-1, [facility]

## 2018-11-28 ENCOUNTER — Other Ambulatory Visit: Payer: Self-pay

## 2018-11-28 ENCOUNTER — Encounter: Payer: Self-pay | Admitting: Family Medicine

## 2018-11-28 ENCOUNTER — Ambulatory Visit (INDEPENDENT_AMBULATORY_CARE_PROVIDER_SITE_OTHER): Payer: Self-pay | Admitting: Family Medicine

## 2018-11-28 VITALS — BP 108/58 | HR 98

## 2018-11-28 DIAGNOSIS — J189 Pneumonia, unspecified organism: Secondary | ICD-10-CM

## 2018-11-28 DIAGNOSIS — E119 Type 2 diabetes mellitus without complications: Secondary | ICD-10-CM

## 2018-11-28 DIAGNOSIS — D61818 Other pancytopenia: Secondary | ICD-10-CM

## 2018-11-28 DIAGNOSIS — Z09 Encounter for follow-up examination after completed treatment for conditions other than malignant neoplasm: Secondary | ICD-10-CM

## 2018-11-28 MED ORDER — CEFDINIR 300 MG PO CAPS: 300.0000 mg | ORAL_CAPSULE | Freq: Two times a day (BID) | ORAL | 0 refills | Status: AC

## 2018-11-28 MED FILL — CEFDINIR 300 MG CAPSULE: 300 | 5 days supply | Qty: 10 | Fill #0

## 2018-11-28 NOTE — Patient Instructions (Addendum)
Thank you for coming to see me today. It was a pleasure. Today we talked about:   Your breathing: I will give you more antibiotic, take it for 5 days.  Try to schedule an appointment within the next week or two with your pulmonologist.    I will call you on Tuesday morning to have a phone visit with you.  If you have any questions or concerns, please do not hesitate to call the office at [phone].  Best,   [place] Constable, DO

## 2018-11-28 NOTE — Assessment & Plan Note (Signed)
Patient doing well without Lantus.  Will continue Metformin 525m BID.  Will recheck A1c at next appointment.  Last A1c in March was <7.

## 2018-11-28 NOTE — Assessment & Plan Note (Signed)
Was stable in hospital.  Likely 2/2 liver disease.  Planning to follow up with GI this month.

## 2018-11-28 NOTE — Assessment & Plan Note (Signed)
Given patient's underlying chronic leukopenia and ILD, she will likely have prolonged course.  She should have cephalosporin for CAP coverage, and continues to have productive cough therefore will add cefdinir for 5 days.  Suspect that ILD is contributing to patient's DOE, should follow up with pulmonologist in 1-2 weeks.  Would hold off on steroids at present as has good air movement and would like to treat adequately for CAP first.  Will f/u in 1 week virtually.  Return precautions discussed with patient, she voiced understanding.

## 2018-11-28 NOTE — Progress Notes (Signed)
Subjective: Chief Complaint  Patient presents with  . Hospitalization Follow-up     HPI: Holly Mueller is a 53 y.o. presenting to clinic today to discuss the following:  [facility] f/u Patient was discharged on 7/16.  Admitted 7/12 for fever and cough, treated for CAP with vancomycin and cefepime.  Since being discharged, two days ago, she was having worsening in her cough and overall fatigue.  No vomiting.  Endorses shortness of breath with exertion, including walking and talking.  She was discharged with Doxycycline and will complete course tonight.  Overall, feels better than when she was admitted, but is still not feeling well.  Was negative for COVID x2.  No further fevers at home since discharge.  Continues to have productive cough with yellow/green sputum.  Has an albuterol inhaler and reports "It's getting to the point that I don't think it's helping anymore."  Hasn't seen pulmonologist since  May.  Has a history of interstitial lung disease.  Notes that she has chronic DOE with this.  Uses albuterol and Asmanex.  Not on steroids.  Has hepatic cirrhosis and notes that her pants are still lose and she has not noticed increased abdominal swelling.  Denies melena, hematochezia.   No dizziness, no chest pain.   Blood cultures NG5D.  2 Diabetes Lantus was discontinued in hospital.  Just taking Metformin 533m BID.  Reports fasting CBG of 130 yesterday.     ROS noted in HPI.   Past Medical, Surgical, Social, and Family History Reviewed & Updated per EMR.   Pertinent Historical Findings include:   Social History   Tobacco Use  Smoking Status Former Smoker  . Packs/day: 0.25  . Types: Cigarettes  . Quit date: 143 . Years since quitting: 23.5  Smokeless Tobacco Never Used  Tobacco Comment   social      Objective: BP (!) 108/58   Pulse 98   LMP  (LMP Unknown)   SpO2 92%  Vitals and nursing notes reviewed  Physical Exam:  General: 53y.o. female in  NAD Cardio: RRR no m/r/g Lungs: bibasilar crackles, stops to take breath while speaking, between every other sentence, O2 sats taken while patient was speaking, able to walk in room without evidence of respiratory distress Skin: warm and dry Extremities: No edema   No results found for this or any previous visit (from the past 72 hour(s)).  Assessment/Plan:  Community acquired pneumonia Given patient's underlying chronic leukopenia and ILD, she will likely have prolonged course.  She should have cephalosporin for CAP coverage, and continues to have productive cough therefore will add cefdinir for 5 days.  Suspect that ILD is contributing to patient's DOE, should follow up with pulmonologist in 1-2 weeks.  Would hold off on steroids at present as has good air movement and would like to treat adequately for CAP first.  Will f/u in 1 week virtually.  Return precautions discussed with patient, she voiced understanding.  Type 2 diabetes mellitus without complication, without long-term current use of insulin ([place] Patient doing well without Lantus.  Will continue Metformin 5014mBID.  Will recheck A1c at next appointment.  Last A1c in March was <7.  [place].  Likely 2/2 liver disease.  Planning to follow up with GI this month.  Patient will likely benefit from Goals of Care discussion in the future given her ILD and Liver Disease likely causing pancytopenia    PATIENT EDUCATION PROVIDED: See AVS  Diagnosis and plan along with any newly prescribed medication(s) were discussed in detail with this patient today. The patient verbalized understanding and agreed with the plan. Patient advised if symptoms worsen return to clinic or ER.     No orders of the defined types were placed in this encounter.   Meds ordered this encounter  Medications  . cefdinir (OMNICEF) 300 MG capsule    Sig: Take 1 capsule (300 mg total) by mouth 2 (two) times daily for 5 days.     Dispense:  10 capsule    Refill:  [place], DO 11/28/2018, 6:22 PM PGY-2 [place]

## 2018-12-05 ENCOUNTER — Encounter: Payer: Self-pay | Admitting: Family Medicine

## 2018-12-05 ENCOUNTER — Other Ambulatory Visit: Payer: Self-pay

## 2018-12-05 ENCOUNTER — Ambulatory Visit (INDEPENDENT_AMBULATORY_CARE_PROVIDER_SITE_OTHER): Payer: Self-pay | Admitting: Family Medicine

## 2018-12-05 ENCOUNTER — Telehealth: Payer: Self-pay | Admitting: Internal Medicine

## 2018-12-05 VITALS — BP 106/65 | HR 83 | Temp 97.7°F | Wt 223.0 lb

## 2018-12-05 DIAGNOSIS — J849 Interstitial pulmonary disease, unspecified: Secondary | ICD-10-CM

## 2018-12-05 DIAGNOSIS — F32A Depression, unspecified: Secondary | ICD-10-CM

## 2018-12-05 DIAGNOSIS — Z598 Other problems related to housing and economic circumstances: Secondary | ICD-10-CM

## 2018-12-05 DIAGNOSIS — Z5989 Other problems related to housing and economic circumstances: Secondary | ICD-10-CM

## 2018-12-05 DIAGNOSIS — Z7189 Other specified counseling: Secondary | ICD-10-CM

## 2018-12-05 DIAGNOSIS — F329 Major depressive disorder, single episode, unspecified: Secondary | ICD-10-CM

## 2018-12-05 DIAGNOSIS — Z591 Inadequate housing: Secondary | ICD-10-CM

## 2018-12-05 NOTE — Patient Instructions (Signed)
Thank you for coming to see me today. It was a pleasure. Today we talked about:   Your goals for your overall health.  I will send a message to Casimer Lanius, our social worker, who can help provide you with housing resources as well as resources for therapy and help with getting insurance.  Please follow-up with me in 1 month.  If you have any questions or concerns, please do not hesitate to call the office at [phone].  Best,   [place] Constable, DO

## 2018-12-05 NOTE — Assessment & Plan Note (Signed)
She continues to endorse SOB and cough.  She is scheduled for follow up with pulmonology on 7/29.  No evidence of respiratory distress during examination today.

## 2018-12-05 NOTE — Telephone Encounter (Signed)
Pt called inquiring about when her procedure will be r/s. Pls call her.

## 2018-12-05 NOTE — Progress Notes (Signed)
Subjective: Chief Complaint  Patient presents with  . Shortness of Breath     HPI: Holly Mueller is a 53 y.o. presenting to [facility] clinic today to discuss the following:  Holly Mueller  Patient notes that she has been very stressed lately due to many reasons, most revolving around her housing, her family, lack of insurance, and her health.  She notes that she has become more tearful recently and she feels as though she has too much going on that she doesn't know where to start.  She recently found a disability attorney and is working on getting disability.  She notes that this also bothers her, however, because she enjoys working, and her ultimate goal is to have her lung problems under control to the point where she could do some work.  She notes that she gets a lot of joy from working, from getting out of the house, and from meeting people.  She notes that her biggest fear, is that her lung problems will not allow her to work in the future.  She states that her immediate goal is to get her cough under control, and states that she has a pulmonology appointment this Wednesday.  She also expresses concerns regarding housing, as she is currently living in a home that is full of mold, bats, and does not have electric.  She also has a lot of stress, because she has a son who is currently hospitalized for psychiatric reasons, which worries her.  She states that she is worried about what will happen to him when he is discharged, as she is not sure where he could go.     Past Medical, Surgical, Social, and Family History Reviewed & Updated per EMR.   Pertinent Historical Findings include:   Social History   Tobacco Use  Smoking Status Former Smoker  . Packs/day: 0.25  . Types: Cigarettes  . Quit date: 22  . Years since quitting: 23.5  Smokeless Tobacco Never Used  Tobacco Comment   social      Objective: BP 106/65   Pulse 83   Temp 97.7 F (36.5 C)  (Oral)   Wt 223 lb (101.2 kg)   LMP  (LMP Unknown)   SpO2 91%   BMI 35.99 kg/m  Vitals and nursing notes reviewed  Physical Exam:  General: 53 y.o. female in NAD Lungs: occasional coughing and occasional deep breaths while speaking, but able to speak in complete sentences during encounter Extremities: Moves all four extremities, ambulates without assistance Psych: intermittently tearful during encounter, especially when talking about her son   No results found for this or any previous visit (from the past 54 hour(s)).  Assessment/Plan:  Does not have Estate agent letter for patient that has names of her doctors as well as their phone numbers, will fax to patient's disability attorney.  We will also send message to Casimer Lanius, social worker for assistance to ensure that Medicaid application is in process.  Inadequate housing Will send message to Casimer Lanius, social worker about providing patient with housing resources.  Depression Given patient's increased tearfulness and difficulty coping with her many stressors, spoke with patient about therapy.  She stated that she would be open to this.  Will send message to Casimer Lanius, social worker, to assist with finding cognitive behavioral therapy for the patient.  Could also consider medication in the future, if no improvement.  Complex care coordination Given the patient's numerous health conditions  as well as lack of insurance, housing, and multiple stressors at home, her largest need from PCP is complex care coordination.  Discussed with patient, that my goal is to assist her first with obtaining housing and insurance, which will then allow her to address her other stressors.  We will follow-up with patient in 1 month to ensure that she is working towards these goals, and is making progress with housing and insurance.  ILD (interstitial lung disease) ([place]) She continues to endorse SOB and cough.  She is scheduled for  follow up with pulmonology on 7/29.  No evidence of respiratory distress during examination today.     PATIENT EDUCATION PROVIDED: See AVS    Diagnosis and plan along with any newly prescribed medication(s) were discussed in detail with this patient today. The patient verbalized understanding and agreed with the plan. Patient advised if symptoms worsen return to clinic or ER.     No orders of the defined types were placed in this encounter.   No orders of the defined types were placed in this encounter.    [place] Constable, DO 12/05/2018, 5:10 PM PGY-2 [place]

## 2018-12-05 NOTE — Assessment & Plan Note (Signed)
Will send message to Casimer Lanius, social worker about providing patient with housing resources.

## 2018-12-05 NOTE — Assessment & Plan Note (Signed)
Printed letter for patient that has names of her doctors as well as their phone numbers, will fax to patient's disability attorney.  We will also send message to Casimer Lanius, social worker for assistance to ensure that Medicaid application is in process.

## 2018-12-05 NOTE — Assessment & Plan Note (Signed)
Given patient's increased tearfulness and difficulty coping with her many stressors, spoke with patient about therapy.  She stated that she would be open to this.  Will send message to Casimer Lanius, social worker, to assist with finding cognitive behavioral therapy for the patient.  Could also consider medication in the future, if no improvement.

## 2018-12-05 NOTE — Assessment & Plan Note (Signed)
Given the patient's numerous health conditions as well as lack of insurance, housing, and multiple stressors at home, her largest need from PCP is complex care coordination.  Discussed with patient, that my goal is to assist her first with obtaining housing and insurance, which will then allow her to address her other stressors.  We will follow-up with patient in 1 month to ensure that she is working towards these goals, and is making progress with housing and insurance.

## 2018-12-06 ENCOUNTER — Telehealth: Payer: Medicaid Other | Admitting: Family Medicine

## 2018-12-06 ENCOUNTER — Other Ambulatory Visit: Payer: Self-pay | Admitting: *Deleted

## 2018-12-06 DIAGNOSIS — E119 Type 2 diabetes mellitus without complications: Secondary | ICD-10-CM

## 2018-12-06 MED ORDER — LEVOTHYROXINE SODIUM 125 MCG PO TABS: 250.0000 ug | ORAL_TABLET | Freq: Every day | ORAL | 0 refills | Status: DC

## 2018-12-06 MED ORDER — METFORMIN HCL 500 MG PO TABS: 500.0000 mg | ORAL_TABLET | Freq: Two times a day (BID) | ORAL | 2 refills | Status: AC

## 2018-12-06 MED FILL — PANTOPRAZOLE SOD DR 40 MG T: 40 | 30 days supply | Qty: 60 | Fill #1

## 2018-12-06 MED FILL — LEVOTHYROXINE 125 MCG TAB: 125 | 30 days supply | Qty: 60 | Fill #0

## 2018-12-06 NOTE — Telephone Encounter (Signed)
Pt has been r/s for 01/09/2019 @ [facility] start time of 1:30pm. Pt will also need to be re-screened for Covid -19. Pt voiced understanding. Information has be sent to pt via mail with new dates and times for procedure.

## 2018-12-07 ENCOUNTER — Other Ambulatory Visit: Payer: Self-pay

## 2018-12-07 ENCOUNTER — Encounter: Payer: Self-pay | Admitting: Acute Care

## 2018-12-07 ENCOUNTER — Ambulatory Visit (INDEPENDENT_AMBULATORY_CARE_PROVIDER_SITE_OTHER): Payer: Self-pay | Admitting: Acute Care

## 2018-12-07 ENCOUNTER — Ambulatory Visit (INDEPENDENT_AMBULATORY_CARE_PROVIDER_SITE_OTHER): Payer: Medicaid Other | Admitting: Licensed Clinical Social Worker

## 2018-12-07 ENCOUNTER — Ambulatory Visit (INDEPENDENT_AMBULATORY_CARE_PROVIDER_SITE_OTHER): Payer: Medicaid Other

## 2018-12-07 VITALS — BP 116/60 | HR 92 | Temp 98.2°F | Ht 66.0 in | Wt 226.2 lb

## 2018-12-07 DIAGNOSIS — Z789 Other specified health status: Secondary | ICD-10-CM

## 2018-12-07 DIAGNOSIS — R0600 Dyspnea, unspecified: Secondary | ICD-10-CM

## 2018-12-07 DIAGNOSIS — R509 Fever, unspecified: Secondary | ICD-10-CM

## 2018-12-07 DIAGNOSIS — R0602 Shortness of breath: Secondary | ICD-10-CM

## 2018-12-07 DIAGNOSIS — G4733 Obstructive sleep apnea (adult) (pediatric): Secondary | ICD-10-CM

## 2018-12-07 DIAGNOSIS — Z139 Encounter for screening, unspecified: Secondary | ICD-10-CM

## 2018-12-07 DIAGNOSIS — J849 Interstitial pulmonary disease, unspecified: Secondary | ICD-10-CM

## 2018-12-07 DIAGNOSIS — K7460 Unspecified cirrhosis of liver: Secondary | ICD-10-CM

## 2018-12-07 DIAGNOSIS — D696 Thrombocytopenia, unspecified: Secondary | ICD-10-CM

## 2018-12-07 MED ORDER — BENZONATATE 100 MG PO CAPS: 100.0000 mg | ORAL_CAPSULE | Freq: Every day | ORAL | 0 refills | Status: DC

## 2018-12-07 MED FILL — BENZONATATE 100 MG CAPS: 100 | 30 days supply | Qty: 30 | Fill #0

## 2018-12-07 NOTE — BH Specialist Note (Signed)
  Care Coordination  Telephone Outreach Note  12/07/2018 Name: Holly Mueller      MRN: [id number]          DOB: Mar 29, 1966 Start time: 11:45   End time: 12:15 Total time: 30 minutes Referred by: PCP, Dr.Meccarello Reason for referral : Community Resources Confirmed patient's name: Yes    Confirmed patient's date of birth  Yes   I reached out to [name] today by phone in response to a referral sent by [name]'s PCP to assess needs and barriers. Related to housing, insurance and coping skills.  Report of /concerns: housing and getting Medicaid  Duration of problem/ how impacting : several years, patient is unable to get the medical care she needs and unable to locate safe affordable housing Recent life changes: son in hospital and decline in her health Family and Social: lives with son, and roommate, housing is substandard and patient must move out before the end of Aug. School/Work: received unemployment Strengths: Capable of independent living Support System: Hopefulness and Self Advocate  ASSESSMENT: Holly Mueller is a 53 y.o. year old female who sees Holly Mueller, Holly Raisin, DO for primary care. Patient is pleasant and engaged in conversation, she is currently experiencing symptoms of possible depression which are exacerbated by her psychosocial stressors. Patient does not qualify for Medicaid.  She currently had the [employer] and [employer]. Barriers to housing is patient has no ongoing income.  Patient may benefit from continuing to resolve her living situation which will reduce some of her stress.  GOALS:  1. reducce symptoms of: stress 2. Increase knowledge and/or ability of: coping skills  3. Demonstrate ability GB:[id number] to current life circumstances Issues discussed: support system, community resource options,  how patient has managed in the past. Discussed housing options and back up plan if she is unable to locate  housing.(living with sister, going to [employer] or [employer]). Discussed reporting landlord however patient is not interesting.   INTERVENTION: Patient interviewed and appropriate assessments performed. Review of patient consultants notes from appropriate care team members was performed as part of provision for care coordination referral. This also includes locating resources and coordinating care . Provided patient with information about [employer] and [employer]  ; also utilized engagement as part of my intervention.  Other interventions include:  Solution-Focused Strategies and Link to [employer], . SDOH (Social Determinants of Health) screening also performed today.  challenges identified: Patent examiner  Stress. Consent obtained from patient, Referral sent via [place] ot housing coalition .  PLAN::  1. Patient will continue to work with Oneta Rack for disability 2. Patient will contact [employer] and [employer] for housing support. 3. LCSW sent referral to [place]  4. LCSW will F/U with patient in two weeks (per pt's reques)  Holly Mueller, Holly Raisin, DO has been notified of this outreach and [name]'s decision and plan.   Casimer Lanius, LCSW [facility]   [phone] 1:59 PM

## 2018-12-07 NOTE — Patient Instructions (Addendum)
We will do a CXR today. We will call you with the results If your CXR is clear we will need to COVID test you again. She denies any positive contacts.  Continue Astmanex and Ventolin as you have been doing. Continue your Flonase and Zyrtec Follow up with GI doctor Please check with your GI physician about taking Tylenol We will send in a prescription for Tessalon Perles for your cough. Use the Tessalon Perles  at bedtime only. We will get you scheduled with Dr. Chase Caller. We need to do some follow up regarding the need for anti fibrotic therapy.

## 2018-12-07 NOTE — Progress Notes (Signed)
History of Present Illness Holly Mueller is a 53 y.o. female with ILD, DM, pancytopenia, and Cirrhosis. She is followed by Dr. Chase Caller   7/29/2020Follow Up Pt. Presents for follow up. She was recently in the hospital with CAP from 11/20/2018 to 11/24/2018. Pt was admitted for fever and cough.  COVID 19 negative x2.  Was given a course of antibiotics, vancomycin and cefepime, for questionable pneumonia.  Pt was also evaluated by GI for emesis. She endorsed continued cough and shortness of breath when she was seen by her PCP on 7/27. He was concerned given patient's underlying chronic leukopenia and ILD, that she should   likely have prolonged course of antibiotics.  He felt she  should have cephalosporin for CAP coverage, as she  continues to have productive cough therefore he  added cefdinir for 5 days to start after completion of the Doxycycline she was discharged on.   She was last seen in the pulmonary clinic 08/11/2018.At that time she was having a stable interval in regard to her ILD. When she was seen  by her PCP on 12/05/2018 she had multiple issues: she is currently living in a home that is full of mold, bats, and does not have electric.Her son had been hospitalized for psychiatric reasons, and she was worried where he would live when discharged.   She presents today for follow up.  She states she is taking her asthmanex and albuterol . She is using her Albuterol every 4 hours every day. She states she has a new fever starting on 7/27/pm. She has been taking Tylenol every 8 hours for fever. Fever has been as high as 100.6. Of note she answered no to the screening question " Do you have a fever". She was seen with full protective PPG.  She endorses body aches, especially her legs. She states she has her baseline cough. She states she has a cough productive for mucus. It ranges from clear to yellow to green.She has been on antibiotics since hospitalization 7/12-7/27/2020. She finished her last  regimen 7/27. She continues to endorse shortness of breath. She was tested for COVID last on 11/22/2018. She is very nauseated today. Her sats were 93% on Room Air at rest. She continues to cough up thin clear secretions. She is not taking anything for her cough. She is wondering when to go back to work. Her job does not resume until after labor day, so we will re-evaluate at that time.She denies chest pain, orthopnea of hemoptysis.   Test Results: CTA 09/2018 Mediastinum/Nodes: Stable hypodense 3.3 cm thyroid isthmus nodule with minimal peripheral calcification. Unremarkable esophagus. No pathologically enlarged axillary, mediastinal or hilar lymph nodes.  Lungs/Pleura: No pneumothorax. No pleural effusion. No acute consolidative airspace disease, lung masses or significant pulmonary nodules. Mild patchy confluent subpleural reticulation and ground-glass attenuation throughout both lungs with associated mild architectural distortion, not substantially changed from 04/05/2018 high-resolution chest CT study.  Upper abdomen: New small to moderate volume upper abdominal ascites. Irregular liver surface compatible with cirrhosis. Partially visualized splenomegaly.  HRCT 03/2018 The appearance of the lungs is compatible with interstitial lung disease, with a spectrum of findings categorized as probable usual interstitial pneumonia (UIP) based on current ATS guidelines. Repeat high-resolution chest CT is suggested in 12 months to assess for temporal changes in the appearance of the lung parenchyma. Aortic atherosclerosis. Cirrhosis with stigmata of portal venous hypertension, including Splenomegaly.  US Liver 09/2018: Hepatic cirrhosis with evidence for portal hypertension based on the splenomegaly and  ascites.  ILD profile 09/16/2017 Hypersensitivity patient is panel negative ANA 1:80 speckled CCP < 16, double-stranded DNA 2, rheumatoid factor less than 14 Ro > 8, La < 1, SCL 70-, RNP  negative  Chest x-ray 04/05/15- bibasilar opacities. Chest x-ray 11/16/15-left basal scarring.  CTA 11/23/16- no pulmonary embolism, no interstitial lung disease, cirrhosis, abdominal adenopathy. CTA 06/30/2017-no PE faint groundglass opacities, hepatic cirrhosis CTA 09/11/2017-no PE, diffuse groundglass opacity, cirrhosis. CTA 03/12/2018- no PE, minimal atelectasis and basal groundglass opacity.   PFT 07/2018 FVC-Pre L 1.87 P  1.80   FVC-%Pred-Pre % 50 P  48   FEV1-Pre L 1.58 P  1.54   FEV1-%Pred-Pre % 53 P  52   FEV6-Pre L 1.87 P  1.78   FEV6-%Pred-Pre % 51 P  48   Pre FEV1/FVC ratio % 84 P  85   FEV1FVC-%Pred-Pre % 105 P  106   Pre FEV6/FVC Ratio % 100 P  100   FEV6FVC-%Pred-Pre % 102 P  103   FEF 25-75 Pre L/sec 1.93 P  2.16   FEF2575-%Pred-Pre % 70 P  76   DLCO unc ml/min/mmHg 14.68 P    DLCO unc % pred % 66 P    DLCO cor ml/min/mmHg 15.56 P    DLCO cor % pred % 69 P    DL/VA ml/min/mmHg/L 5.43 P    DL/VA % pred % 128 P    Resulting Agency      PFTs 11/13/16 FVC 1.84 (49%), FEV1 1.52 (51%), F/F 82, TLC 44% Unable to complete DLCO Severe restriction. No obstruction  Echo 09/2018 The left ventricle has normal systolic function with an ejection fraction of 60-65%. The cavity size was normal. Left ventricular diastolic parameters were normal. No evidence of left ventricular regional wall motion abnormalities. The right ventricle has normal systolic function. The cavity was mildly enlarged. There is no increase in right ventricular wall thickness. There is mild to moderate mitral annular calcification present. The aortic valve is tricuspid. Moderate thickening of the aortic valve. Mild calcification of the aortic valve. Aortic valve regurgitation was not assessed by color flow Doppler. Very mild stenosis of the aortic valve.  BAL 07/20/2018 800 COLONIES/mL HAEMOPHILUS INFLUENZAE  BETA LACTAMASE NEGATIVE  100 COLONIES/mL VIRIDANS STREPTOCOCCUS   Simple office walk 250 feet x  3  laps goal with forehead probe 05/25/2018   O2 used Room air  Number laps completed 3  Comments about pace slow  Resting Pulse Ox/HR 100% and 70/min  Final Pulse Ox/HR 94% and 92/min  Desaturated </= 88% no  Desaturated <= 3% points Yes, 6 points  Got Tachycardic >/= 90/min yes  Symptoms at end of test Mild dyspnea final lap  Miscellaneous comments x   09/26/2018 EGD Normal esophagus. No esophageal varices seen. - Type 1 isolated gastric varices (IGV1, varices located in the fundus), with an area of ulceration suggesting stuttering bleeding. - Non-bleeding erosive gastropathy. Biopsied. - Normal examined duodenum. No duodenal varices seen. - The examination was otherwise normal.  CBC Latest Ref Rng & Units 11/24/2018 11/23/2018 11/22/2018  WBC 4.0 - 10.5 K/uL 1.1(LL) 1.0(LL) 1.3(LL)  Hemoglobin 12.0 - 15.0 g/dL 9.8(L) 9.8(L) 10.1(L)  Hematocrit 36.0 - 46.0 % 30.2(L) 30.4(L) 31.1(L)  Platelets 150 - 400 K/uL 57(L) 50(L) 51(L)    BMP Latest Ref Rng & Units 11/24/2018 11/23/2018 11/22/2018  Glucose 70 - 99 mg/dL 107(H) 129(H) 195(H)  BUN 6 - 20 mg/dL <5(L) 5(L) 5(L)  Creatinine 0.44 - 1.00 mg/dL 0.54 0.60 0.68  BUN/Creat Ratio 9 - 23 - - -  Sodium 135 - 145 mmol/L 139 137 135  Potassium 3.5 - 5.1 mmol/L 4.0 3.3(L) 3.5  Chloride 98 - 111 mmol/L 113(H) 111 111  CO2 22 - 32 mmol/L 19(L) 20(L) 17(L)  Calcium 8.9 - 10.3 mg/dL 8.3(L) 8.0(L) 8.0(L)    BNP    Component Value Date/Time   BNP 9.3 08/28/2018 0646    ProBNP    Component Value Date/Time   PROBNP 35.0 09/22/2018 1420    PFT    Component Value Date/Time   FEV1PRE 1.58 07/12/2018 1358   FEV1POST 1.52 11/13/2016 1121   FVCPRE 1.87 07/12/2018 1358   FVCPOST 1.84 11/13/2016 1121   DLCOUNC 14.68 07/12/2018 1358   PREFEV1FVCRT 84 07/12/2018 1358   PSTFEV1FVCRT 82 11/13/2016 1121    Dg Chest Port 1 View  Result Date: 11/20/2018 CLINICAL DATA:  Shortness of breath with vomiting EXAM: PORTABLE CHEST 1 VIEW COMPARISON:   09/22/2018, 08/27/2018 FINDINGS: Low lung volumes. Streaky atelectasis or minimal infiltrate at the left base. Stable cardiomediastinal silhouette. No pneumothorax. Mild chronic interstitial lung disease. IMPRESSION: Low lung volumes. Streaky atelectasis or minimal infiltrate at the left base. Electronically Signed   By: Donavan Foil M.D.   On: 11/20/2018 23:15     Past medical hx Past Medical History:  Diagnosis Date  . Acute respiratory failure with hypoxia ([place]) 09/22/2018  . Arthritis    bursitis left hip flares-not an issue  . Chronic kidney disease   . Diabetes mellitus without complication ([place])   . Edentulous    10-19-13 at present  . Epilepsy ([place]) in 1972   No seizures since 1972. Previously treated with phenobarbital.   . Gastric varices with bleeding   . GERD (gastroesophageal reflux disease) 2008  . Headache(784.0)    hx of migraines   . 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 Hypothyroidism 2008  . Lower esophageal ring 08/18/2013  . Pneumonia   . Primary biliary cholangitis ([place]) 10/24/2018  . Seasonal allergies 2003  . SEIZURES, HX OF 12/12/2009   Annotation: last seizure in early 1970s Qualifier: Diagnosis of  By: Carlena Sax  MD, Colletta [place]    . Shortness of breath   . Wears glasses      Social History   Tobacco Use  . Smoking status: Former Smoker    Packs/day: 0.25    Types: Cigarettes    Quit date: 1997    Years since quitting: 23.5  . Smokeless tobacco: Never Used  . Tobacco comment: social  Substance Use Topics  . Alcohol use: Not Currently  . Drug use: No    Ms.Vanacker reports that she quit smoking about 23 years ago. Her smoking use included cigarettes. She smoked 0.25 packs per day. She has never used smokeless tobacco. She reports previous alcohol use. She reports that she does not use drugs.  Tobacco Cessation: Former smoker with a 24 pack year smoking history. She quit smoking in 2017.  Past surgical hx, Family hx, Social hx  all reviewed.  Current Outpatient Medications on File Prior to Visit  Medication Sig  . albuterol (PROAIR HFA) 108 (90 Base) MCG/ACT inhaler INHALE 2 PUFFS INTO THE LUNGS EVERY 6 HOURS AS NEEDED FOR SHORTNESS OF BREATH (Patient taking differently: Inhale 2 puffs into the lungs every 4 (four) hours as needed for wheezing or shortness of breath. )  . albuterol (PROVENTIL) (2.5 MG/3ML) 0.083% nebulizer solution Take 3 mLs (2.5 mg total) by nebulization  every 6 (six) hours as needed for wheezing or shortness of breath.  . blood glucose meter kit and supplies KIT Dispense based on patient and insurance preference. Use up to four times daily as directed. (FOR ICD-9 250.00, 250.01).  . blood glucose meter kit and supplies Dispense based on patient and insurance preference. Use up to four times daily as directed. (FOR ICD-10 E10.9, E11.9).  . cetirizine (ZYRTEC) 10 MG tablet Take 1 tablet (10 mg total) by mouth daily.  . fluticasone (FLONASE) 50 MCG/ACT nasal spray Place 2 sprays into both nostrils daily.  Marland Kitchen glucose blood (TRUE METRIX BLOOD GLUCOSE TEST) test strip Use as instructed  . levothyroxine (SYNTHROID) 125 MCG tablet Take 2 tablets (250 mcg total) by mouth daily.  . metFORMIN (GLUCOPHAGE) 500 MG tablet Take 1 tablet (500 mg total) by mouth 2 (two) times daily with a meal.  . Mometasone Furoate (ASMANEX HFA) 200 MCG/ACT AERO Inhale 2 puffs into the lungs 2 (two) times daily.  . pantoprazole (PROTONIX) 40 MG tablet Take 1 tablet (40 mg total) by mouth 2 (two) times daily before a meal.  . TRUEplus Lancets 28G MISC Use to check blood sugar as directed.  . ursodiol (ACTIGALL) 500 MG tablet Take 1 tablet (500 mg total) by mouth 3 (three) times daily.   No current facility-administered medications on file prior to visit.      Allergies  Allergen Reactions  . Aspirin Nausea Only    Upset stomach    Review Of Systems:  Constitutional:   No  weight loss, night sweats,  + Fevers, chills, fatigue,  or  lassitude.  HEENT:   No headaches,  Difficulty swallowing,  Tooth/dental problems, or  Sore throat,                No sneezing, itching, ear ache, nasal congestion,+  post nasal drip,   CV:  No chest pain,  Orthopnea, PND, swelling in lower extremities, anasarca, dizziness, palpitations, syncope.   GI  No heartburn, indigestion, abdominal pain, + nausea, no vomiting, No diarrhea, change in bowel habits,+  loss of appetite, bloody stools.   Resp: + shortness of breath with exertion or at rest.  + excess mucus, + productive cough,  No non-productive cough,  No coughing up of blood.  + change in color of mucus.  No wheezing.  No chest wall deformity  Skin: no rash or lesions.  GU: no dysuria, change in color of urine, no urgency or frequency.  No flank pain, no hematuria   MS:  No joint pain or swelling.  No decreased range of motion.  No back pain.  Psych:  No change in mood or affect. No depression or anxiety.  No memory loss.   Vital Signs BP 116/60 (BP Location: Left Arm, Cuff Size: Large)   Pulse 92   Temp 98.2 F (36.8 C) (Oral)   Ht _0  (1.676 m)   Wt 226 lb 3.2 oz (102.6 kg)   LMP  (LMP Unknown)   SpO2 93%   BMI 36.51 kg/m    Physical Exam:  General- No distress,  A&Ox3, pleasant ENT: No sinus tenderness, TM clear, pale nasal mucosa, no oral exudate,+ post nasal drip, no LAN Cardiac: S1, S2, regular rate and rhythm, no murmur Chest: No wheeze/ rales/ dullness; no accessory muscle use, no nasal flaring, no sternal retractions. Very diminished breath sounds per bases. Abd.: Soft Non-tender, ND, Body mass index is 36.51 kg/m., large Ext: No clubbing cyanosis, No edema  Neuro:  normal strength, MAE x 4, A&O x 3 Skin: No rashes,no lesions,  warm and dry Psych: normal mood and behavior, she is tired of being sick. She has a lot of life stressors at present   Assessment/Plan  No problem-specific Assessment & Plan notes found for this encounter.  Idiopathic  pulmonary fibrosis versus NSIP versus interstitial lung disease with autoimmune features.  Dyspnea Living conditions >>   currently living in a home that is full of mold, bats, and does not have electricity. BAL 07/2018>>  The lavage was mixed cellularity with 28% lymphocytes, 54% neutrophils and 16% macrophage [normally greater than 95% is macrophages).  Based on mixd cellularity this is likely UIP.  She has some lymphocytosis but it is not exaggerated above 40% thus making sarcoidosis or hypersensitive pneumonitis unlikely.  Given the presence of several autoimmune bodies and female gender  this is very likely interstitial lung disease with autoimmune features of idiopathic variety [I PAF with likely pathologic UIP.  There is some element of progression between 2018 and 2020 but overall this is only mild.  She will probably benefit from anti-fibrotic's.  However, liver disease is a limitation with current oral anti-fibrotic's.This needs to be addressed when she sees Dr. Chase Caller in August. Plan: Follow up with Dr. Loanne Drilling  August 4th at 2:15 pm Hypersensitivity Pneumonitis panel ( Lives in a home with mold and bats, no electricity) Consider bronch for cultures , AFB and Fungal. Multiple treatments with ABX and her very low volume  CXR does not clear. ACE level Full PFT's 6 minute walk>> see if she qualifies for oxygen HRCT>> worsening dyspnea and to evaluate for progression of ?  fibrosis             Cough  - likely due to fibrosis but with allergy history - might be worth re-challenge with inhaler sample Plan: - Will start a more aggressive inhaler regimen to see if dyspnea improves.. ( Needs triple therapy) Continue Flonase and Zyrtec daily  Thrombocytopenia Suspect 2/2 cirrhosis Plan Follow up CBC's per Primary Seek immediate emergency care for bleeding that is not controlled with pressure, or coughing up blood that is more than 1/4 cup.   ? OSA/ Nocturnal hypoxemia Plan: Will  schedule ONO once patient is living in a home with electricity to ensure she is adequately oxygenated. She is being followed by case management and has filed paperwork for disability and Medicaid to help with her living situation and medical needs.  New Fever Fever x 2-3 days T Max is 100.6 Taking Tylenol every 8 hours Body Aches Cough ( ? Chronic 2/2/ IPF) Plan CXR now If no acute process will send for COVID testing. Needs bronch with sputum cultures, Fungal and AFB.  Cirrhosis Chronically elevated AST Plan LFT monitoring  per PCP Will make treatment with anti fibrotic's difficult  This is a very complicated patient with suspected ILD and recent admission for CAP. She was  seen in the clinic for continued dyspnea and CXR with mild diffuse interstitial pulmonary opacity. ? Mild pulmonary edema, atypical infection, broncho vascular  crowding. She has been on antibiotics  11/20/2018 - 12/05/2018. (  vancomycin and cefepime as an inpatient, the doxycycline at discharge, then an additional 5 days of Cefdinir per her PCP.) She states she developed a new fever the night of 7/27  And she has been taking Tylenol every 8 hours for her fever. I have asked her to check with her GI MD to get dosing guideline  regarding tylenol use. She knows not to use Ibuprofen. She has been tested for Covid x 2, and was negative both times. She needs Hypersensitivity pneumonitis panel  And ACE level, and possibly a  bronch to further evaluate her slow to resolve CAP/ ? Auto immune condition. Last BAL done 07/2018 was positive for H Flu, beta lactamase negative, and > 100 colonies of viridans Streptococcus.She also needs further  evaluation for treatment of her chronic lung infection and her suspected  pulmonary Fibrosis.   She will probably benefit from anti-fibrotic's.  However, liver disease is a limitation with current oral anti-fibrotic's. This patient was discussed with Dr. Loanne Drilling who will see her in the clinic 12/13/2018  at 2:15 pm. We discussed 6 minute walk, ACE and HP panel, Repeat HRCT for worsening dyspnea, , Full PFT's with DLCO, increasing inhalers to triple therapy.   Magdalen Spatz, NP 12/07/2018  4:52 PM

## 2018-12-08 ENCOUNTER — Encounter: Payer: Self-pay | Admitting: Acute Care

## 2018-12-08 ENCOUNTER — Emergency Department (HOSPITAL_COMMUNITY): Payer: Medicaid Other

## 2018-12-08 ENCOUNTER — Encounter (HOSPITAL_COMMUNITY): Payer: Self-pay | Admitting: Emergency Medicine

## 2018-12-08 ENCOUNTER — Other Ambulatory Visit: Payer: Self-pay

## 2018-12-08 ENCOUNTER — Telehealth: Payer: Self-pay | Admitting: Internal Medicine

## 2018-12-08 ENCOUNTER — Inpatient Hospital Stay (HOSPITAL_COMMUNITY)
Admission: EM | Admit: 2018-12-08 | Discharge: 2018-12-11 | DRG: 196 | Disposition: A | Discharge status: Home Health | Payer: Medicaid Other | Attending: Family Medicine | Admitting: Family Medicine

## 2018-12-08 ENCOUNTER — Telehealth: Payer: Self-pay | Admitting: Acute Care

## 2018-12-08 DIAGNOSIS — Z597 Insufficient social insurance and welfare support: Secondary | ICD-10-CM

## 2018-12-08 DIAGNOSIS — D7589 Other specified diseases of blood and blood-forming organs: Secondary | ICD-10-CM | POA: Diagnosis present

## 2018-12-08 DIAGNOSIS — Z886 Allergy status to analgesic agent status: Secondary | ICD-10-CM

## 2018-12-08 DIAGNOSIS — N200 Calculus of kidney: Secondary | ICD-10-CM | POA: Diagnosis present

## 2018-12-08 DIAGNOSIS — Z801 Family history of malignant neoplasm of trachea, bronchus and lung: Secondary | ICD-10-CM

## 2018-12-08 DIAGNOSIS — Z7722 Contact with and (suspected) exposure to environmental tobacco smoke (acute) (chronic): Secondary | ICD-10-CM | POA: Diagnosis present

## 2018-12-08 DIAGNOSIS — J849 Interstitial pulmonary disease, unspecified: Secondary | ICD-10-CM

## 2018-12-08 DIAGNOSIS — J9601 Acute respiratory failure with hypoxia: Secondary | ICD-10-CM | POA: Diagnosis present

## 2018-12-08 DIAGNOSIS — K743 Primary biliary cirrhosis: Secondary | ICD-10-CM | POA: Diagnosis present

## 2018-12-08 DIAGNOSIS — R059 Cough, unspecified: Secondary | ICD-10-CM

## 2018-12-08 DIAGNOSIS — Z7951 Long term (current) use of inhaled steroids: Secondary | ICD-10-CM

## 2018-12-08 DIAGNOSIS — J302 Other seasonal allergic rhinitis: Secondary | ICD-10-CM | POA: Diagnosis present

## 2018-12-08 DIAGNOSIS — Z8701 Personal history of pneumonia (recurrent): Secondary | ICD-10-CM

## 2018-12-08 DIAGNOSIS — E1122 Type 2 diabetes mellitus with diabetic chronic kidney disease: Secondary | ICD-10-CM | POA: Diagnosis present

## 2018-12-08 DIAGNOSIS — J984 Other disorders of lung: Secondary | ICD-10-CM

## 2018-12-08 DIAGNOSIS — E039 Hypothyroidism, unspecified: Secondary | ICD-10-CM | POA: Diagnosis present

## 2018-12-08 DIAGNOSIS — Z7984 Long term (current) use of oral hypoglycemic drugs: Secondary | ICD-10-CM

## 2018-12-08 DIAGNOSIS — Z79890 Hormone replacement therapy (postmenopausal): Secondary | ICD-10-CM

## 2018-12-08 DIAGNOSIS — N189 Chronic kidney disease, unspecified: Secondary | ICD-10-CM | POA: Diagnosis present

## 2018-12-08 DIAGNOSIS — Z20828 Contact with and (suspected) exposure to other viral communicable diseases: Secondary | ICD-10-CM | POA: Diagnosis present

## 2018-12-08 DIAGNOSIS — R05 Cough: Secondary | ICD-10-CM

## 2018-12-08 DIAGNOSIS — Z87891 Personal history of nicotine dependence: Secondary | ICD-10-CM

## 2018-12-08 DIAGNOSIS — R0602 Shortness of breath: Secondary | ICD-10-CM

## 2018-12-08 DIAGNOSIS — K219 Gastro-esophageal reflux disease without esophagitis: Secondary | ICD-10-CM | POA: Diagnosis present

## 2018-12-08 DIAGNOSIS — Z79899 Other long term (current) drug therapy: Secondary | ICD-10-CM

## 2018-12-08 DIAGNOSIS — Z9981 Dependence on supplemental oxygen: Secondary | ICD-10-CM

## 2018-12-08 DIAGNOSIS — R1030 Lower abdominal pain, unspecified: Secondary | ICD-10-CM

## 2018-12-08 DIAGNOSIS — Z598 Other problems related to housing and economic circumstances: Secondary | ICD-10-CM

## 2018-12-08 DIAGNOSIS — Z77120 Contact with and (suspected) exposure to mold (toxic): Secondary | ICD-10-CM

## 2018-12-08 DIAGNOSIS — R319 Hematuria, unspecified: Secondary | ICD-10-CM | POA: Diagnosis present

## 2018-12-08 LAB — COMPREHENSIVE METABOLIC PANEL
ALT: 29 U/L (ref 0–44)
AST: 59 U/L — ABNORMAL HIGH (ref 15–41)
Albumin: 2.8 g/dL — ABNORMAL LOW (ref 3.5–5.0)
Alkaline Phosphatase: 123 U/L (ref 38–126)
Anion gap: 9 (ref 5–15)
BUN: <5 mg/dL — ABNORMAL LOW (ref 6–20)
CO2: 20 mmol/L — ABNORMAL LOW (ref 22–32)
Calcium: 8.7 mg/dL — ABNORMAL LOW (ref 8.9–10.3)
Chloride: 108 mmol/L (ref 98–111)
Creatinine, Ser: 0.6 mg/dL (ref 0.44–1.00)
GFR calc Af Amer: >60 mL/min (ref >60)
GFR calc non Af Amer: >60 mL/min (ref >60)
Glucose, Bld: 101 mg/dL — ABNORMAL HIGH (ref 70–99)
Potassium: 3.7 mmol/L (ref 3.5–5.1)
Sodium: 137 mmol/L (ref 135–145)
Total Bilirubin: 3.4 mg/dL — ABNORMAL HIGH (ref 0.3–1.2)
Total Protein: 7.9 g/dL (ref 6.5–8.1)

## 2018-12-08 LAB — URINALYSIS, ROUTINE W REFLEX MICROSCOPIC
Bilirubin Urine: NEGATIVE
Glucose, UA: NEGATIVE mg/dL
Ketones, ur: NEGATIVE mg/dL
Leukocytes,Ua: NEGATIVE
Nitrite: NEGATIVE
Protein, ur: 30 mg/dL — AB
RBC / HPF: >50 RBC/hpf — ABNORMAL HIGH (ref 0–5)
Specific Gravity, Urine: 1.016 (ref 1.005–1.030)
pH: 6 (ref 5.0–8.0)

## 2018-12-08 LAB — CBC
HCT: 38.5 % (ref 36.0–46.0)
Hemoglobin: 12.6 g/dL (ref 12.0–15.0)
MCH: 28.6 pg (ref 26.0–34.0)
MCHC: 32.7 g/dL (ref 30.0–36.0)
MCV: 87.5 fL (ref 80.0–100.0)
Platelets: 111 10*3/uL — ABNORMAL LOW (ref 150–400)
RBC: 4.4 MIL/uL (ref 3.87–5.11)
RDW: 15.8 % — ABNORMAL HIGH (ref 11.5–15.5)
WBC: 1.9 10*3/uL — ABNORMAL LOW (ref 4.0–10.5)
nRBC: 0 % (ref 0.0–0.2)

## 2018-12-08 LAB — I-STAT BETA HCG BLOOD, ED (MC, WL, AP ONLY): I-stat hCG, quantitative: <5 m[IU]/mL (ref <5)

## 2018-12-08 LAB — LIPASE, BLOOD: Lipase: 39 U/L (ref 11–51)

## 2018-12-08 MED ORDER — SODIUM CHLORIDE 0.9% FLUSH
3.0000 mL | Freq: Once | INTRAVENOUS | Status: AC
  Administered 2018-12-09: 3 mL via INTRAVENOUS

## 2018-12-08 MED ORDER — ONDANSETRON 4 MG PO TBDP
4.0000 mg | ORAL_TABLET | Freq: Once | ORAL | Status: AC | PRN
  Administered 2018-12-08: 4 mg via ORAL
  Filled 2018-12-08: qty 1

## 2018-12-08 MED ORDER — MORPHINE SULFATE (PF) 4 MG/ML IV SOLN
4.0000 mg | Freq: Once | INTRAVENOUS | Status: AC
  Administered 2018-12-09: 4 mg via INTRAVENOUS
  Filled 2018-12-08: qty 1

## 2018-12-08 NOTE — ED Triage Notes (Signed)
Pt with c/o left lower quadrant abdominal pain with radiation to her back since last night. Reports nausea with 3 episodes of nausea. She has a productive cough, denies fever.

## 2018-12-08 NOTE — Telephone Encounter (Signed)
Patient would like to let Dr. Carlean Purl know that she is going to the ED due to having a sharp abd pain.

## 2018-12-08 NOTE — Telephone Encounter (Signed)
Jonelle Sidle I have discussed [name] with Dr. Loanne Drilling. She is seeing her August 4th. She needs the following labs etc. Please have her COVID tested before she comes in to have anything done ( New fever and cough) She needs COVID test 6 minute walk,   ACE level Hypersensitivity pneumonitis panel Full PFT's HRCT for worsening dyspnea ( probable iILD) We need to have her come and pick up some inhalers. I want her to stop her asthmanex, but start symbicort and spiriva. ( Symbicort 2 puffs twice daily and Spiriva 2 puffs once daily) Rinse mouth after use.  She will need samples and she will need instruction on use.  Again, have her come in to do this after her covid testing is done. She needs to stay on the asthmanex until she has the new inhalers. Thanks so much!!  .

## 2018-12-08 NOTE — ED Provider Notes (Signed)
[facility]ERG[facility]Y DEPARTMENT Provider Note   CSN: [id number] Arrival date & time: 12/08/18  1428    History   Chief Complaint Chief Complaint  Patient presents with  . Abdominal Pain    HPI Holly Mueller is a 53 y.o. female with a hx of Interstitial lung disease, IDDM, hypothyroidism, obesity, pancytopenia, primary biliary cholangitis with cirrhosis of the liver, hysterectomy, GI bleed from gastric varix in 09/2018 (treated with EGD and cryoanoacrylate injection/obliteration of gastric varix at [facility]) presents to the Emergency Department complaining of gradual, persistent, progressively worsening LLQ abd pain onset 2 days.  pt reports pain is a 10/10 and radiates into her  Lower back.  Associated symptoms include nausea and anorexia.  Pt reports fever of 100.6 on Tuesday morning.  No treatments PTA.  Nothing makes it better and walking makes it worse. Pt reports several episodes of emesis 2 days ago and 1x last night.  Pt reports emesis was NBNB.  Denies diarrhea, bloody stools, hematuria.   Pt also c/o cough worsening over the last several weeks, but much worse in the last 2 days, now with associated SOB.  Pt reports she has completed several rounds of antibiotics in the last few weeks for a pneumonia, but doesn't feel she is getting better.  Pt does not wear oxygen at home. DOE has worsened recently as well, denies orthopnea.  Denies known sick contacts or COVID.        The history is provided by the patient and medical records. No language interpreter was used.    Past Medical History:  Diagnosis Date  . Acute respiratory failure with hypoxia ([place]) 09/22/2018  . Arthritis    bursitis left hip flares-not an issue  . Chronic kidney disease   . Diabetes mellitus without complication ([place])   . Edentulous    10-19-13 at present  . Epilepsy ([place]) in 1972   No seizures since 1972. Previously treated with phenobarbital.   . Gastric varices with bleeding   .  GERD (gastroesophageal reflux disease) 2008  . Headache(784.0)    hx of migraines   . 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 ([place])   . Hypothyroidism 2008  . Lower esophageal ring 08/18/2013  . Pneumonia   . Primary biliary cholangitis ([place]) 10/24/2018  . Seasonal allergies 2003  . SEIZURES, HX OF 12/12/2009   Annotation: last seizure in early 1970s Qualifier: Diagnosis of  By: Carlena Sax  MD, [place]    . Shortness of breath   . Wears glasses     Patient Active Problem List   Diagnosis Date Noted  . Inadequate housing 12/05/2018  . Community acquired pneumonia 11/28/2018  . Primary biliary cholangitis ([place]) 10/24/2018  . Gastric varices with bleeding   . Anemia 09/22/2018  . Pancytopenia ([place]) 08/28/2018  . Does not have health insurance 04/04/2018  . ILD (interstitial lung disease) ([place]) 03/23/2018  . Type 2 diabetes mellitus without complication, without long-term current use of insulin ([place])   . 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 Bicytopenia 10/24/2016  . GERD (gastroesophageal reflux disease) 10/23/2016  . Cough 12/09/2015  . Precordial chest pain 08/03/2015  . DOE (dyspnea on exertion) 08/17/2013  . Ganglion cyst of wrist 08/24/2012  . Right hip pain 08/23/2012  . Complex care coordination 02/14/2012  . Obesity 07/08/2010  . Depression 07/26/2008  . Hypothyroidism 10/12/2006  . Restrictive lung disease 07/30/2006    Past Surgical History:  Procedure Laterality Date  .  ABDOMINAL HYSTERECTOMY     " partial "  . BALLOON DILATION N/A 10/24/2013   Procedure: BALLOON DILATION;  Surgeon: Gatha Mayer, MD;  Location: [facility];  Service: Endoscopy;  Laterality: N/A;  . BIOPSY  09/26/2018   Procedure: BIOPSY;  Surgeon: Thornton Park, MD;  Location: [facility];  Service: Gastroenterology;;  . CESAREAN SECTION     x2  . DENTAL SURGERY     multiple extractions 3'15  . DILATION AND CURETTAGE OF UTERUS    .  ESOPHAGOGASTRODUODENOSCOPY N/A 10/24/2013   Procedure: ESOPHAGOGASTRODUODENOSCOPY (EGD);  Surgeon: Gatha Mayer, MD;  Location: [facility];  Service: Endoscopy;  Laterality: N/A;  . ESOPHAGOGASTRODUODENOSCOPY (EGD) WITH PROPOFOL N/A 09/26/2018   Procedure: ESOPHAGOGASTRODUODENOSCOPY (EGD) WITH PROPOFOL;  Surgeon: Thornton Park, MD;  Location: [facility];  Service: Gastroenterology;  Laterality: N/A;  . IR PARACENTESIS  09/23/2018  . LIVER BIOPSY  06/30/2012   Procedure: LIVER BIOPSY;  Surgeon: Shann Medal, MD;  Location: [facility];  Service: General;;  . NOVASURE ABLATION    . SHOULDER ARTHROSCOPY Right 2011  . UMBILICAL HERNIA REPAIR N/A 06/30/2012   Procedure: remove umbilicus;  Surgeon: Shann Medal, MD;  Location: [facility];  Service: General;  Laterality: N/A;  . VENTRAL HERNIA REPAIR N/A 06/30/2012   Procedure: LAPAROSCOPIC VENTRAL HERNIA;  Surgeon: Shann Medal, MD;  Location: [facility];  Service: General;  Laterality: N/A;  With Mesh  . VIDEO BRONCHOSCOPY Bilateral 07/20/2018   Procedure: VIDEO BRONCHOSCOPY WITHOUT FLUORO;  Surgeon: Brand Males, MD;  Location: [facility];  Service: Cardiopulmonary;  Laterality: Bilateral;  . WISDOM TOOTH EXTRACTION       OB History   No obstetric history on file.      Home Medications    Prior to Admission medications   Medication Sig Start Date End Date Taking? Authorizing Provider  albuterol (PROAIR HFA) 108 (90 Base) MCG/ACT inhaler INHALE 2 PUFFS INTO THE LUNGS EVERY 6 HOURS AS NEEDED FOR SHORTNESS OF BREATH Patient taking differently: Inhale 2 puffs into the lungs every 4 (four) hours as needed for wheezing or shortness of breath.  04/25/18   Rory Percy, DO  albuterol (PROVENTIL) (2.5 MG/3ML) 0.083% nebulizer solution Take 3 mLs (2.5 mg total) by nebulization every 6 (six) hours as needed for wheezing or shortness of breath. 04/25/18   Rory Percy, DO  benzonatate (TESSALON) 100 MG capsule Take 1 capsule (100 mg total) by  mouth at bedtime. 12/07/18   Magdalen Spatz, NP  blood glucose meter kit and supplies KIT Dispense based on patient and insurance preference. Use up to four times daily as directed. (FOR ICD-9 250.00, 250.01). 06/19/17   Rory Percy, DO  blood glucose meter kit and supplies Dispense based on patient and insurance preference. Use up to four times daily as directed. (FOR ICD-10 E10.9, E11.9). 09/02/18   Dessa Phi, DO  cetirizine (ZYRTEC) 10 MG tablet Take 1 tablet (10 mg total) by mouth daily. 01/27/17   [name], MD  fluticasone (FLONASE) 50 MCG/ACT nasal spray Place 2 sprays into both nostrils daily.    [provider]  glucose blood (TRUE METRIX BLOOD GLUCOSE TEST) test strip Use as instructed 09/16/18   [name], MD  levothyroxine (SYNTHROID) 125 MCG tablet Take 2 tablets (250 mcg total) by mouth daily. 12/06/18   Meccariello, Bernita Raisin, DO  metFORMIN (GLUCOPHAGE) 500 MG tablet Take 1 tablet (500 mg total) by mouth 2 (two) times daily with a meal. 12/06/18   Meccariello,  Bernita Raisin, DO  Mometasone [facility] HFA) 200 MCG/ACT AERO Inhale 2 puffs into the lungs 2 (two) times daily. 05/25/18   Brand Males, MD  pantoprazole (PROTONIX) 40 MG tablet Take 1 tablet (40 mg total) by mouth 2 (two) times daily before a meal. 10/17/18   [name], MD  TRUEplus Lancets 28G MISC Use to check blood sugar as directed. 09/16/18   [name], MD  ursodiol (ACTIGALL) 500 MG tablet Take 1 tablet (500 mg total) by mouth 3 (three) times daily. 10/26/18   Gatha Mayer, MD    Family History Family History  Problem Relation Age of Onset  . Hypertension Mother   . Diabetes Mother   . Cirrhosis Mother        ? medications  . Lung cancer Father   . Stroke Father   . Diabetes Sister   . Hypertension Sister   . Colon cancer Neg Hx   . Esophageal cancer Neg Hx   . Rectal cancer Neg Hx     Social History Social History   Tobacco Use  . Smoking status:  Former Smoker    Packs/day: 0.25    Types: Cigarettes    Quit date: 1997    Years since quitting: 23.5  . Smokeless tobacco: Never Used  . Tobacco comment: social  Substance Use Topics  . Alcohol use: Not Currently  . Drug use: No     Allergies   Aspirin   Review of Systems Review of Systems  Constitutional: Positive for fatigue and fever. Negative for appetite change, diaphoresis and unexpected weight change.  HENT: Negative for mouth sores.   Eyes: Negative for visual disturbance.  Respiratory: Positive for cough, chest tightness and shortness of breath. Negative for wheezing.   Cardiovascular: Negative for chest pain.  Gastrointestinal: Positive for abdominal pain, nausea and vomiting. Negative for constipation and diarrhea.  Endocrine: Negative for polydipsia, polyphagia and polyuria.  Genitourinary: Negative for dysuria, frequency, hematuria and urgency.  Musculoskeletal: Negative for back pain and neck stiffness.  Skin: Negative for rash.  Allergic/Immunologic: Negative for immunocompromised state.  Neurological: Negative for syncope, light-headedness and headaches.  Hematological: Does not bruise/bleed easily.  Psychiatric/Behavioral: Negative for sleep disturbance. The patient is not nervous/anxious.      Physical Exam Updated Vital Signs BP 138/79 (BP Location: Left Arm)   Pulse 96   Temp 99.4 F (37.4 C) (Oral)   Resp 18   LMP  (LMP Unknown)   SpO2 95%   Physical Exam Vitals signs and nursing note reviewed.  Constitutional:      General: She is not in acute distress.    Appearance: She is not diaphoretic.  HENT:     Head: Normocephalic.  Eyes:     General: No scleral icterus.    Conjunctiva/sclera: Conjunctivae normal.  Neck:     Musculoskeletal: Normal range of motion.  Cardiovascular:     Rate and Rhythm: Normal rate and regular rhythm.     Pulses: Normal pulses.          Radial pulses are 2+ on the right side and 2+ on the left side.   Pulmonary:     Effort: Tachypnea and accessory muscle usage present. No prolonged expiration, respiratory distress or retractions.     Breath sounds: No stridor. Examination of the right-lower field reveals decreased breath sounds. Examination of the left-lower field reveals decreased breath sounds. Decreased breath sounds present.     Comments: Equal chest rise. Harsh cough On oxygen  Coarse breath sounds throughout Abdominal:     General: There is no distension.     Palpations: Abdomen is soft.     Tenderness: There is abdominal tenderness in the suprapubic area and left lower quadrant. There is no right CVA tenderness, left CVA tenderness, guarding or rebound.  Musculoskeletal:     Comments: Moves all extremities equally and without difficulty.  Skin:    General: Skin is warm and dry.     Capillary Refill: Capillary refill takes less than 2 seconds.  Neurological:     Mental Status: She is alert.     GCS: GCS eye subscore is 4. GCS verbal subscore is 5. GCS motor subscore is 6.     Comments: Speech is clear and goal oriented.  Psychiatric:        Mood and Affect: Mood normal.      ED Treatments / Results  Labs (all labs ordered are listed, but only abnormal results are displayed) Labs Reviewed  COMPREHENSIVE METABOLIC PANEL - Abnormal; Notable for the following components:      Result Value   CO2 20 (*)    Glucose, Bld 101 (*)    BUN <5 (*)    Calcium 8.7 (*)    Albumin 2.8 (*)    AST 59 (*)    Total Bilirubin 3.4 (*)    All other components within normal limits  CBC - Abnormal; Notable for the following components:   WBC 1.9 (*)    RDW 15.8 (*)    Platelets 111 (*)    All other components within normal limits  URINALYSIS, ROUTINE W REFLEX MICROSCOPIC - Abnormal; Notable for the following components:   Color, Urine AMBER (*)    APPearance HAZY (*)    Hgb urine dipstick LARGE (*)    Protein, ur 30 (*)    RBC / HPF >50 (*)    Bacteria, UA RARE (*)    All other  components within normal limits  SARS CORONAVIRUS 2 (HOSPITAL ORDER, [facility])  URINE CULTURE  LIPASE, BLOOD  I-STAT BETA HCG BLOOD, ED ([facility], [facility], [facility] ONLY)    Radiology Dg Chest 2 View  Result Date: 12/07/2018 CLINICAL DATA:  Shortness of breath, fever EXAM: CHEST - 2 VIEW COMPARISON:  11/20/2018 FINDINGS: The heart mediastinum are normal in size. Low volume examination with mild diffuse interstitial pulmonary opacity. The osseous structures are unremarkable. IMPRESSION: Low volume examination with mild diffuse interstitial pulmonary opacity. This may reflect mild edema, atypical infection, and/or bronchovascular crowding. There is no focal airspace opacity. Electronically Signed   By: Eddie Candle M.D.   On: 12/07/2018 17:08   Ct Abdomen Pelvis W Contrast  Result Date: 12/09/2018 CLINICAL DATA:  Left lower quadrant pain radiating to back EXAM: CT ABDOMEN AND PELVIS WITH CONTRAST TECHNIQUE: Multidetector CT imaging of the abdomen and pelvis was performed using the standard protocol following bolus administration of intravenous contrast. CONTRAST:  130m OMNIPAQUE IOHEXOL 300 MG/ML  SOLN COMPARISON:  09/23/2018 FINDINGS: Lower chest: Scarring/atelectasis in the lingula, right middle lobe, and bilateral lower lobes. Hepatobiliary: Cirrhosis.  No suspicious hepatic lesions. Gallbladder is unremarkable. No intrahepatic or extrahepatic ductal dilatation. Pancreas: Within normal limits. Spleen: Splenomegaly, measuring 23.1 cm in maximal craniocaudal dimension (sagittal image 142). Adrenals/Urinary Tract: Adrenal glands are within normal limits. Four nonobstructing right renal calculi measuring up to 5 mm in the right lower pole (series 3/image 43). Additional 4 mm nonobstructing left lower pole renal calculus (series 3/image  60). No hydronephrosis. Bladder is thick-walled although underdistended. Stomach/Bowel: Stomach is mildly thick-walled although grossly unremarkable. No  evidence of bowel obstruction. Normal appendix (series 3/image 64). Vascular/Lymphatic: No evidence of abdominal aortic aneurysm. Portal vein is patent. Large perigastric varices with suspected post treatment changes related to prior gastric varix embolization. Small upper abdominal lymph nodes, likely reactive. No suspicious pelvic lymphadenopathy. Reproductive: Uterus is within normal limits. Bilateral ovaries are within normal limits. Other: No abdominopelvic ascites. Musculoskeletal: Grade 1 spondylolisthesis at L5-S1. Mild degenerative changes of the visualized thoracolumbar spine. IMPRESSION: Bilateral nonobstructing renal calculi measuring up to 5 mm. No hydronephrosis. Cirrhosis. Splenomegaly. Large perigastric varices with post treatment changes related to prior gastric varix embolization. Portal vein is patent. No abdominopelvic ascites. Additional ancillary findings as above. Electronically Signed   By: Julian Hy M.D.   On: 12/09/2018 03:04   Dg Chest Port 1 View  Result Date: 12/08/2018 CLINICAL DATA:  Left lower quadrant pain, nausea, cough EXAM: PORTABLE CHEST 1 VIEW COMPARISON:  12/07/2018 FINDINGS: Low lung volumes with mild left basilar atelectasis. No pleural effusion or pneumothorax. The heart is normal in size. IMPRESSION: No evidence of acute cardiopulmonary disease. Electronically Signed   By: Julian Hy M.D.   On: 12/08/2018 23:45    Procedures Procedures (including critical care time)  Medications Ordered in ED Medications  sodium chloride flush (NS) 0.9 % injection 3 mL (3 mLs Intravenous Given 12/09/18 0014)  ondansetron (ZOFRAN-ODT) disintegrating tablet 4 mg (4 mg Oral Given 12/08/18 1543)  morphine 4 MG/ML injection 4 mg (4 mg Intravenous Given 12/09/18 0009)  iohexol (OMNIPAQUE) 300 MG/ML solution 100 mL (100 mLs Intravenous Contrast Given 12/09/18 0206)  albuterol (PROVENTIL) (2.5 MG/3ML) 0.083% nebulizer solution 5 mg (5 mg Nebulization Given 12/09/18 0411)   methylPREDNISolone sodium succinate (SOLU-MEDROL) 125 mg/2 mL injection 125 mg (125 mg Intravenous Given 12/09/18 0412)  albuterol (PROVENTIL) (2.5 MG/3ML) 0.083% nebulizer solution 5 mg (5 mg Nebulization Given 12/09/18 0506)  ipratropium (ATROVENT) nebulizer solution 0.5 mg (0.5 mg Nebulization Given 12/09/18 0512)     Initial Impression / Assessment and Plan / ED Course  I have reviewed the triage vital signs and the nursing notes.  Pertinent labs & imaging results that were available during my care of the patient were reviewed by me and considered in my medical decision making (see chart for details).  Clinical Course as of Dec 08 513  Thu Dec 08, 2018  2259 Elevated from 1.5 2 weeks ago  Total Bilirubin(!): 3.4 [HM]  2300 Improved  WBC(!): 1.9 [HM]  2300 WNL  Hemoglobin: 12.6 [HM]  2300 Improved  Platelets(!): 111 [HM]  2300 Worse from 2 weeks ago  RBC / HPF(!): >50 [HM]  2301 No evidence of infection  Nitrite: NEGATIVE [HM]  Fri Dec 09, 2018  0035 persistent  Resp(!): 26 [HM]  0514 Discussed with Dr. Volanda Napoleon and Higinio Plan of [facility] who will admit.   [HM]    Clinical Course User Index [HM] Aquita Simmering, Gwenlyn Perking        Presents with several complaints.  Patient complained of abdominal plain worse in the left lower quadrant and lower abdomen with radiation into her lower back.  Patient also endorsing nausea and anorexia along with fever.  CT scan without acute abnormality.  She does have several kidney stones located within the parenchyma of the kidney but no evidence of ureteral stones.  Patient's urine does have a significant amount of blood which has not been previously present.  She has no nitrites or leukocytes however I am concerned about potential infection given pain and hematuria.  Additionally, patient with history of interstitial lung disease with worsening cough and shortness of breath over the last several days.  She was recently treated for pneumonia but  does not feel she is getting better.  Nursing staff noted hypoxia and placed patient on 2 L of oxygen.  Oxygen sats in the mid 90s here with oxygen in place.  She does not wear oxygen at home.  She is tachypneic.  Chest x-ray does not show persistent or worsening infiltrates.  Repeat COVID test is negative.  Patient given steroids and albuterol without significant improvement in breathing.  She will need admission for further respiratory treatments.  The patient was discussed with Dr. Dayna Barker who agrees with the treatment plan.     Final Clinical Impressions(s) / ED Diagnoses   Final diagnoses:  Lower abdominal pain  Shortness of breath  Cough    ED Discharge Orders    None       Loni Muse Gwenlyn Perking 12/09/18 0515    Mesner, Corene Cornea, MD 12/09/18 [phone]

## 2018-12-08 NOTE — Telephone Encounter (Signed)
Patient is in the ED for LLQ pain radiating into her back

## 2018-12-09 ENCOUNTER — Emergency Department (HOSPITAL_COMMUNITY): Payer: Medicaid Other

## 2018-12-09 ENCOUNTER — Encounter (HOSPITAL_COMMUNITY): Payer: Self-pay | Admitting: General Practice

## 2018-12-09 ENCOUNTER — Observation Stay (HOSPITAL_COMMUNITY): Payer: Medicaid Other

## 2018-12-09 DIAGNOSIS — R0602 Shortness of breath: Secondary | ICD-10-CM

## 2018-12-09 DIAGNOSIS — J9601 Acute respiratory failure with hypoxia: Secondary | ICD-10-CM | POA: Diagnosis present

## 2018-12-09 DIAGNOSIS — R1030 Lower abdominal pain, unspecified: Secondary | ICD-10-CM | POA: Insufficient documentation

## 2018-12-09 LAB — CBC
HCT: 36 % (ref 36.0–46.0)
Hemoglobin: 11.6 g/dL — ABNORMAL LOW (ref 12.0–15.0)
MCH: 27.9 pg (ref 26.0–34.0)
MCHC: 32.2 g/dL (ref 30.0–36.0)
MCV: 86.5 fL (ref 80.0–100.0)
Platelets: 91 10*3/uL — ABNORMAL LOW (ref 150–400)
RBC: 4.16 MIL/uL (ref 3.87–5.11)
RDW: 15.9 % — ABNORMAL HIGH (ref 11.5–15.5)
WBC: 1.3 10*3/uL — CL (ref 4.0–10.5)
nRBC: 0 % (ref 0.0–0.2)

## 2018-12-09 LAB — CREATININE, SERUM
Creatinine, Ser: 0.78 mg/dL (ref 0.44–1.00)
GFR calc Af Amer: >60 mL/min (ref >60)
GFR calc non Af Amer: >60 mL/min (ref >60)

## 2018-12-09 LAB — HEMOGLOBIN A1C
Hgb A1c MFr Bld: 4.8 % (ref 4.8–5.6)
Mean Plasma Glucose: 91.06 mg/dL

## 2018-12-09 LAB — SARS CORONAVIRUS 2 BY RT PCR (HOSPITAL ORDER, PERFORMED IN ~~LOC~~ HOSPITAL LAB): SARS Coronavirus 2: NEGATIVE

## 2018-12-09 MED ORDER — PANTOPRAZOLE SODIUM 40 MG PO TBEC
40.0000 mg | DELAYED_RELEASE_TABLET | Freq: Two times a day (BID) | ORAL | Status: DC
  Administered 2018-12-09 – 2018-12-11 (×6): 40 mg via ORAL
  Filled 2018-12-09 (×6): qty 1

## 2018-12-09 MED ORDER — FLUTICASONE PROPIONATE 50 MCG/ACT NA SUSP
2.0000 | Freq: Every day | NASAL | Status: DC
  Administered 2018-12-09 – 2018-12-11 (×3): 2 via NASAL
  Filled 2018-12-09: qty 16

## 2018-12-09 MED ORDER — LORATADINE 10 MG PO TABS
10.0000 mg | ORAL_TABLET | Freq: Every day | ORAL | Status: DC
  Administered 2018-12-09 – 2018-12-11 (×3): 10 mg via ORAL
  Filled 2018-12-09 (×3): qty 1

## 2018-12-09 MED ORDER — UMECLIDINIUM BROMIDE 62.5 MCG/INH IN AEPB
1.0000 | INHALATION_SPRAY | Freq: Every day | RESPIRATORY_TRACT | Status: DC
  Administered 2018-12-09 – 2018-12-11 (×3): 1 via RESPIRATORY_TRACT
  Filled 2018-12-09: qty 7

## 2018-12-09 MED ORDER — IPRATROPIUM BROMIDE 0.02 % IN SOLN
0.5000 mg | Freq: Once | RESPIRATORY_TRACT | Status: AC
  Administered 2018-12-09: 0.5 mg via RESPIRATORY_TRACT
  Filled 2018-12-09: qty 2.5

## 2018-12-09 MED ORDER — URSODIOL 300 MG PO CAPS
600.0000 mg | ORAL_CAPSULE | Freq: Three times a day (TID) | ORAL | Status: DC
  Administered 2018-12-09 – 2018-12-11 (×8): 600 mg via ORAL
  Filled 2018-12-09 (×9): qty 2

## 2018-12-09 MED ORDER — ALBUTEROL SULFATE (2.5 MG/3ML) 0.083% IN NEBU
5.0000 mg | INHALATION_SOLUTION | Freq: Once | RESPIRATORY_TRACT | Status: AC
  Administered 2018-12-09: 04:00:00 5 mg via RESPIRATORY_TRACT
  Filled 2018-12-09: qty 6

## 2018-12-09 MED ORDER — METHYLPREDNISOLONE SODIUM SUCC 125 MG IJ SOLR
125.0000 mg | Freq: Once | INTRAMUSCULAR | Status: AC
  Administered 2018-12-09: 04:00:00 125 mg via INTRAVENOUS
  Filled 2018-12-09: qty 2

## 2018-12-09 MED ORDER — LEVOTHYROXINE SODIUM 50 MCG PO TABS
250.0000 ug | ORAL_TABLET | Freq: Every day | ORAL | Status: DC
  Administered 2018-12-10 – 2018-12-11 (×2): 250 ug via ORAL
  Filled 2018-12-09 (×2): qty 1

## 2018-12-09 MED ORDER — ALBUTEROL SULFATE (2.5 MG/3ML) 0.083% IN NEBU: 2.5000 mg | INHALATION_SOLUTION | RESPIRATORY_TRACT | Status: DC | PRN

## 2018-12-09 MED ORDER — MOMETASONE FURO-FORMOTEROL FUM 100-5 MCG/ACT IN AERO
2.0000 | INHALATION_SPRAY | Freq: Two times a day (BID) | RESPIRATORY_TRACT | Status: DC
  Administered 2018-12-09 – 2018-12-11 (×4): 2 via RESPIRATORY_TRACT
  Filled 2018-12-09: qty 8.8

## 2018-12-09 MED ORDER — ENOXAPARIN SODIUM 40 MG/0.4ML ~~LOC~~ SOLN
40.0000 mg | Freq: Every day | SUBCUTANEOUS | Status: DC
  Administered 2018-12-09 – 2018-12-11 (×3): 40 mg via SUBCUTANEOUS
  Filled 2018-12-09 (×3): qty 0.4

## 2018-12-09 MED ORDER — ALBUTEROL SULFATE (2.5 MG/3ML) 0.083% IN NEBU
5.0000 mg | INHALATION_SOLUTION | Freq: Once | RESPIRATORY_TRACT | Status: AC
  Administered 2018-12-09: 05:00:00 5 mg via RESPIRATORY_TRACT

## 2018-12-09 MED ORDER — LEVOTHYROXINE SODIUM 50 MCG PO TABS
250.0000 ug | ORAL_TABLET | Freq: Every day | ORAL | Status: DC
  Administered 2018-12-09: 250 ug via ORAL
  Filled 2018-12-09: qty 2

## 2018-12-09 MED ORDER — IOHEXOL 300 MG/ML  SOLN
100.0000 mL | Freq: Once | INTRAMUSCULAR | Status: AC | PRN
  Administered 2018-12-09: 02:00:00 100 mL via INTRAVENOUS

## 2018-12-09 MED ORDER — ACETAMINOPHEN 650 MG RE SUPP: 650.0000 mg | Freq: Four times a day (QID) | RECTAL | Status: DC | PRN

## 2018-12-09 MED ORDER — ALBUTEROL SULFATE (2.5 MG/3ML) 0.083% IN NEBU
5.0000 mg | INHALATION_SOLUTION | Freq: Once | RESPIRATORY_TRACT | Status: DC
  Filled 2018-12-09: qty 6

## 2018-12-09 MED ORDER — ENSURE ENLIVE PO LIQD
237.0000 mL | Freq: Two times a day (BID) | ORAL | Status: DC
  Administered 2018-12-10 – 2018-12-11 (×4): 237 mL via ORAL

## 2018-12-09 MED ORDER — ACETAMINOPHEN 325 MG PO TABS
650.0000 mg | ORAL_TABLET | Freq: Four times a day (QID) | ORAL | Status: DC | PRN
  Administered 2018-12-09 – 2018-12-10 (×2): 650 mg via ORAL
  Filled 2018-12-09 (×2): qty 2

## 2018-12-09 NOTE — ED Notes (Addendum)
Critical lab received; WBC 1.3; Notified Dr. Erin Hearing.

## 2018-12-09 NOTE — ED Notes (Signed)
Attempted report x1. RN will call back.

## 2018-12-09 NOTE — H&P (Addendum)
[facility] Admission History and Physical Service Pager: [phone]  Patient name: Holly Mueller Medical record number: [id number] Date of birth: [birth year] Age: 53 y.o. Gender: female  Primary Care Provider: Cleophas Dunker, DO Consultants: none Code Status:Full Preferred Emergency Contact: Shirlee More, sister, [phone]  Chief Complaint: abdominal pain and shortness of breath  Assessment and Plan: Holly Mueller is a 53 y.o. female presenting with abdominal pain and shortness of breath . PMH is significant for ILD, Gastric Varices, Primary [place] Cholangitis with Hepatic Cirrhosis, GERD, Hypothyroidism, DM type II.  Progressive DOE with reported hypoxia, in setting of suspected ILD: Pt reports no worsening shortness of breathing since last admission, however has been progressing over the last month.  Associated with productive cough, mild fever (max 100.6), chills, poor appetite.  Recently seen by her pulmonologist on 7/29, who recommended future further work-up including HRCT, pneumonitis labs, and full PFTs.  Additionally was going to switch her inhaler regimen.  Patient noted to be hypoxic during initial ED evaluation requiring 2L [place], however not documented. RR 18-23 HR 87-92 (108-111, post aerosol therapy).  Unlabored breathing, satting appropriately at rest on RA, do note she becomes tachypneic with movement. Fine bibasilar crackles consistent with ILD. CXR without acute cardiopulmonary disease.  Concern via her pulmonologist team has been secondary to ILD with autoimmune features vs idiopathic pulmonary fibrosis vs NSIP. Considered pneumonia, but as patient was recently admitted from 7/12-7/16 with CAP and full appropriate antibiotic course with a clear CXR, believe this is less likely.  Reassured she is COVID negative today, however may consider still the differential as she has some, while nonspecific, symptoms could be consistent with  this. Well's score for PE 0, unlikely.  -Admit to Med Surg, Attending Dr. Erin Hearing -maintain oxygen 88-92% -Albuterol q4h prn -Start Dulera 2puffs BID and Umeclidinium bromide 1puff daily (formula equivalents to inhaler regimen pulmonologist wanted patient to start on outpatient) -PT, 6-minute walk test, assess need for O2 -FYI'd pulmonology, Dr. Chase Caller, to assess if they would like any additional work-up while inpatient: After discussion, we will move forward with supine/prone HRCT, spirometry with DLCO, and hypersensitivity pneumonitis panel   Acute Abdominal Pain- stable pt reports sharp LLQ abdominal pain that radiates to lower back.  Physical exam unremarkable, s/p morphine in the ED. hx Kidney stones. CT Abdomen showed bilateral non obstructing renal calculi measuring 5 mm, no hydronephrosis without evidence of diverticulitis. Small upper abdominal lymph nodes likely reactive. U/A positive for hematuria, patient is postmenopausal.  Suspect likely renal colic with possible passage of kidney stones. Also considered UTI/pyelonephritis but u/a neg nitrites/leukocytes and pt denies any dysuria, frequency or urgency. Considered C.diff due to recent usage of antibiotics and recent watery diarrhea.  Lastly, consider diverticulitis given LLQ abdominal pain with bowel changes, however less likely with no evidence on imaging and pain pattern more consistent with above. -obtain stool c.diff -repeat u/a -Tylenol 617m q6h prn  -Can consider adding on increased pain regimen if abdominal pain returns  Bi-cytopenia: Chronic, stable.  WBC 1.9, platelets 111, around baseline. Has been following with hematology/oncology outpatient. - Monitor CBC  T2DM: Chronic, stable.  Last A1c 6.2 in 07/2018.  Glucose 101 on admit.  Takes metformin at home for control. 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  Recently had a gastric variceal bleed in  09/2018, had an ablation performed at [facility]. Hepatic cirrhosis and splenomegaly noted on CT abdomen with large perigastric varices stable with posttreatment changes. Follows with [facility], will be following up in August for an endoscopic ultrasound for surveillance of her ablation.  AST 59, appears to be around recent baseline, ALT WNL.  Total bilirubin 3.4, which is elevated from previous around 1.5.  We will monitor this.  Suspect thrombocytopenia secondary to hepatic cirrhosis.  -Continue home ursodiol 600 mg TID -CMP -Ensure follow-up with GI outpatient  GERD-chronic Asymptomatic -Continue home med Protonix 40 mg daily  Seasonal Allergies-chronic Asymptomatic -continue home med Loratadine 10 mg daily  FEN/GI: Clear fluids Prophylaxis: Lovenox  Disposition: Admit to [place] for observation, suspect DC later today or tomorrow  History of Present Illness:  Holly Mueller is a 53 y.o. female presenting with abdominal pain for two days.  Pain started Wednesday in her lower left abdomen that was sharp and kept her awake all night. She describes the pain as a stretching/pulling feeling that started in the suprapubic area that radiated to her lower back on Thursday. She decided to seek medical attention today as she couldn't stand the pain any longer.  She denies any other associated pain.  She reports having some intermittent fevers of 100-100.6 and chills this past week. She also reports having a poor appetite and vomited once due to smelling of food that has been more persistent since last admission but denies hematemesis.  She reports having kidney stones in the past but feels that this pain is not as bad.  She also reports having watery stools that started Tuesday, 1-2 daily, no melena or hematochezia.  She also reports a chronic cough having some clear/loose sputum sometimes thick.  She  reports the cough has not worsened since her last admission.  She just finished another course of antibiotics that was prescribed by her PCP.  She uses Albuterol twice a day but has increased usage to every 4hrs with some relief.  She was seen 07/29  Pulmonology clinic and is followed for her ILD.  She has an appointment to see Dr. Lissa Merlin 12/13/2018 at 2:15pm. She reports that her shortness of breath is the same as usual, known DOE that is has not progressed.  She denies any chest pain, palpitations, or leg swelling. She reports that her housing situation has mold and bats on the outside above her porch.  She denies any contact with sick persons.  She is a non smoker but does have exposure from a roommate. She denies any alcohol intake, last BOFBP>10CHE ago, or illicit drug use.   In the ED, reported she was tachypneic and using accessory muscles, due to hypoxia per ED staff, was started on 2L nasal cannula. She was given albuterol nebulizer and Solumedrol 125 mg IV.  She was also given Morphine 4 mg IV for abdominal pain.  CT abdomen showed bilateral non obstructing renal calculi measuring 5 mm, no hydronephrosis. Cirrhosis/Splenomegaly. Large perigastric varices with post treatment changes related to prior gastric varix embolization. Small upper abdominal lymph nodes likely reactive.  However, on our evaluation she was sitting comfortably with unlabored breathing and satting 93-94% on RA (turned nasal cannula off when into the room) for the duration of our evaluation.  Do note as she has to move around, will become slightly tachypneic and tachycardic.   Review Of Systems: Per HPI with the following additions:   Review of Systems  Constitutional: Positive for chills and fever.  Respiratory:  Positive for cough, sputum production and shortness of breath. Negative for hemoptysis and wheezing.   Cardiovascular: Negative for chest pain, palpitations, orthopnea and leg swelling.  Gastrointestinal: Positive  for abdominal pain, diarrhea and vomiting. Negative for blood in stool, constipation and heartburn.  Genitourinary: Negative for dysuria, frequency, hematuria and urgency.  Musculoskeletal: Positive for back pain.  Neurological: Positive for headaches. Negative for dizziness and weakness.    Patient Active Problem List   Diagnosis Date Noted  . Acute respiratory failure with hypoxia ([place]) 12/09/2018  . Lower abdominal pain   . Inadequate housing 12/05/2018  . Community acquired pneumonia 11/28/2018  . Primary biliary cholangitis ([place]) 10/24/2018  . Gastric varices with bleeding   . Anemia 09/22/2018  . Pancytopenia ([place]) 08/28/2018  . Does not have health insurance 04/04/2018  . ILD (interstitial lung disease) ([place]) 03/23/2018  . Type 2 diabetes mellitus without complication, without long-term current use of insulin ([place])   . 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 Bicytopenia 10/24/2016  . GERD (gastroesophageal reflux disease) 10/23/2016  . Cough 12/09/2015  . Precordial chest pain 08/03/2015  . DOE (dyspnea on exertion) 08/17/2013  . Ganglion cyst of wrist 08/24/2012  . Right hip pain 08/23/2012  . Complex care coordination 02/14/2012  . Obesity 07/08/2010  . Shortness of breath 12/12/2009  . Depression 07/26/2008  . Hypothyroidism 10/12/2006  . Restrictive lung disease 07/30/2006    Past Medical History: Past Medical History:  Diagnosis Date  . Acute respiratory failure with hypoxia ([place]) 09/22/2018  . Arthritis    bursitis left hip flares-not an issue  . Chronic kidney disease   . Diabetes mellitus without complication ([place])   . Edentulous    10-19-13 at present  . Epilepsy ([place]) in 1972   No seizures since 1972. Previously treated with phenobarbital.   . Gastric varices with bleeding   . GERD (gastroesophageal reflux disease) 2008  . Headache(784.0)    hx of migraines   . 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 Hypothyroidism 2008  . Lower esophageal ring 08/18/2013  . Pneumonia   . Primary biliary cholangitis ([place]) 10/24/2018  . Seasonal allergies 2003  . SEIZURES, HX OF 12/12/2009   Annotation: last seizure in early 1970s Qualifier: Diagnosis of  By: Carlena Sax  MD, Colletta [place]    . Shortness of breath   . Wears glasses     Past Surgical History: Past Surgical History:  Procedure Laterality Date  . ABDOMINAL HYSTERECTOMY     " partial "  . BALLOON DILATION N/A 10/24/2013   Procedure: BALLOON DILATION;  Surgeon: Gatha Mayer, MD;  Location: [facility];  Service: Endoscopy;  Laterality: N/A;  . BIOPSY  09/26/2018   Procedure: BIOPSY;  Surgeon: Thornton Park, MD;  Location: [facility];  Service: Gastroenterology;;  . CESAREAN SECTION     x2  . DENTAL SURGERY     multiple extractions 3'15  . DILATION AND CURETTAGE OF UTERUS    . ESOPHAGOGASTRODUODENOSCOPY N/A 10/24/2013   Procedure: ESOPHAGOGASTRODUODENOSCOPY (EGD);  Surgeon: Gatha Mayer, MD;  Location: [facility];  Service: Endoscopy;  Laterality: N/A;  . ESOPHAGOGASTRODUODENOSCOPY (EGD) WITH PROPOFOL N/A 09/26/2018   Procedure: ESOPHAGOGASTRODUODENOSCOPY (EGD) WITH PROPOFOL;  Surgeon: Thornton Park, MD;  Location: [facility];  Service: Gastroenterology;  Laterality: N/A;  . IR PARACENTESIS  09/23/2018  . LIVER BIOPSY  06/30/2012   Procedure: LIVER BIOPSY;  Surgeon: Shann Medal, MD;  Location: [facility];  Service: General;;  . NOVASURE ABLATION    . SHOULDER ARTHROSCOPY Right 2011  . UMBILICAL HERNIA REPAIR N/A 06/30/2012   Procedure: remove umbilicus;  Surgeon: Shann Medal, MD;  Location: [facility];  Service: General;  Laterality: N/A;  . VENTRAL HERNIA REPAIR N/A 06/30/2012   Procedure: LAPAROSCOPIC VENTRAL HERNIA;  Surgeon: Shann Medal, MD;  Location: [facility];  Service: General;  Laterality: N/A;  With Mesh  . VIDEO BRONCHOSCOPY Bilateral 07/20/2018   Procedure: VIDEO BRONCHOSCOPY WITHOUT FLUORO;  Surgeon: Brand Males,  MD;  Location: [facility];  Service: Cardiopulmonary;  Laterality: Bilateral;  . WISDOM TOOTH EXTRACTION      Social History: Social History   Tobacco Use  . Smoking status: Former Smoker    Packs/day: 0.25    Types: Cigarettes    Quit date: 1997    Years since quitting: 23.5  . Smokeless tobacco: Never Used  . Tobacco comment: social  Substance Use Topics  . Alcohol use: Not Currently  . Drug use: No   Additional social history: Please also refer to relevant sections of EMR.  Family History: Family History  Problem Relation Age of Onset  . Hypertension Mother   . Diabetes Mother   . Cirrhosis Mother        ? medications  . Lung cancer Father   . Stroke Father   . Diabetes Sister   . Hypertension Sister   . Colon cancer Neg Hx   . Esophageal cancer Neg Hx   . Rectal cancer Neg Hx    (If not completed, MUST add something in)  Allergies and Medications: Allergies  Allergen Reactions  . Aspirin Nausea Only    Upset stomach   No current facility-administered medications on file prior to encounter.    Current Outpatient Medications on File Prior to Encounter  Medication Sig Dispense Refill  . albuterol (PROAIR HFA) 108 (90 Base) MCG/ACT inhaler INHALE 2 PUFFS INTO THE LUNGS EVERY 6 HOURS AS NEEDED FOR SHORTNESS OF BREATH (Patient taking differently: Inhale 2 puffs into the lungs every 4 (four) hours as needed for wheezing or shortness of breath. ) 8.5 g 5  . albuterol (PROVENTIL) (2.5 MG/3ML) 0.083% nebulizer solution Take 3 mLs (2.5 mg total) by nebulization every 6 (six) hours as needed for wheezing or shortness of breath. 75 mL 2  . benzonatate (TESSALON) 100 MG capsule Take 1 capsule (100 mg total) by mouth at bedtime. 30 capsule 0  . blood glucose meter kit and supplies KIT Dispense based on patient and insurance preference. Use up to four times daily as directed. (FOR ICD-9 250.00, 250.01). 1 each 0  . blood glucose meter kit and supplies Dispense based on  patient and insurance preference. Use up to four times daily as directed. (FOR ICD-10 E10.9, E11.9). 1 each 0  . cetirizine (ZYRTEC) 10 MG tablet Take 1 tablet (10 mg total) by mouth daily. 30 tablet 11  . fluticasone (FLONASE) 50 MCG/ACT nasal spray Place 2 sprays into both nostrils daily.    Marland Kitchen glucose blood (TRUE METRIX BLOOD GLUCOSE TEST) test strip Use as instructed 100 each 12  . levothyroxine (SYNTHROID) 125 MCG tablet Take 2 tablets (250 mcg total) by mouth daily. 60 tablet 0  . metFORMIN (GLUCOPHAGE) 500 MG tablet Take 1 tablet (500 mg total) by mouth 2 (two) times daily with a meal. 180 tablet 2  . Mometasone Furoate (ASMANEX HFA) 200 MCG/ACT AERO Inhale 2 puffs into the lungs 2 (two) times daily. 1  Inhaler 0  . pantoprazole (PROTONIX) 40 MG tablet Take 1 tablet (40 mg total) by mouth 2 (two) times daily before a meal. 60 tablet 3  . TRUEplus Lancets 28G MISC Use to check blood sugar as directed. 100 each 1  . ursodiol (ACTIGALL) 500 MG tablet Take 1 tablet (500 mg total) by mouth 3 (three) times daily. 270 tablet 3    Objective: BP 110/65   Pulse 98   Temp 99.5 F (37.5 C) (Rectal)   Resp (!) 21   LMP  (LMP Unknown)   SpO2 95%  Exam: General: pt laying on stretcher in NAD Eyes:PERRLA _0  ENTM: mucus membranes moist  Neck: supple, non tender, no JVD appreciated Cardiovascular: Tachycardic HR 111 regular rhythm, no murmurs appreciated Respiratory: fine crackles appreciated bilateral posterior lower lobes, no increased work of breathing, oxygen sat 94% on room air Gastrointestinal: obese, round normoactive BS present, non tender to palpation throughout with light and deep palpation.  No CVA tenderness bilaterally. MSK: 5/5 strength bilateral upper and lower extremities Derm: no cuts or abrasions noted, no rashes noted throughout abdomen or back Neuro: A&Ox4 Psych:pleasant and cooperative  Labs and Imaging: CBC BMET  Recent Labs  Lab 12/08/18 1544  WBC 1.9*  HGB 12.6   HCT 38.5  PLT 111*   Recent Labs  Lab 12/08/18 1544  NA 137  K 3.7  CL 108  CO2 20*  BUN <5*  CREATININE 0.60  GLUCOSE 101*  CALCIUM 8.7*       Ct Abdomen Pelvis W Contrast  Result Date: 12/09/2018 CLINICAL DATA:  Left lower quadrant pain radiating to back EXAM: CT ABDOMEN AND PELVIS WITH CONTRAST TECHNIQUE: Multidetector CT imaging of the abdomen and pelvis was performed using the standard protocol following bolus administration of intravenous contrast. CONTRAST:  179m OMNIPAQUE IOHEXOL 300 MG/ML  SOLN COMPARISON:  09/23/2018 FINDINGS: Lower chest: Scarring/atelectasis in the lingula, right middle lobe, and bilateral lower lobes. Hepatobiliary: Cirrhosis.  No suspicious hepatic lesions. Gallbladder is unremarkable. No intrahepatic or extrahepatic ductal dilatation. Pancreas: Within normal limits. Spleen: Splenomegaly, measuring 23.1 cm in maximal craniocaudal dimension (sagittal image 142). Adrenals/Urinary Tract: Adrenal glands are within normal limits. Four nonobstructing right renal calculi measuring up to 5 mm in the right lower pole (series 3/image 43). Additional 4 mm nonobstructing left lower pole renal calculus (series 3/image 60). No hydronephrosis. Bladder is thick-walled although underdistended. Stomach/Bowel: Stomach is mildly thick-walled although grossly unremarkable. No evidence of bowel obstruction. Normal appendix (series 3/image 64). Vascular/Lymphatic: No evidence of abdominal aortic aneurysm. Portal vein is patent. Large perigastric varices with suspected post treatment changes related to prior gastric varix embolization. Small upper abdominal lymph nodes, likely reactive. No suspicious pelvic lymphadenopathy. Reproductive: Uterus is within normal limits. Bilateral ovaries are within normal limits. Other: No abdominopelvic ascites. Musculoskeletal: Grade 1 spondylolisthesis at L5-S1. Mild degenerative changes of the visualized thoracolumbar spine. IMPRESSION: Bilateral  nonobstructing renal calculi measuring up to 5 mm. No hydronephrosis. Cirrhosis. Splenomegaly. Large perigastric varices with post treatment changes related to prior gastric varix embolization. Portal vein is patent. No abdominopelvic ascites. Additional ancillary findings as above. Electronically Signed   By: SJulian HyM.D.   On: 12/09/2018 03:04   Dg Chest Port 1 View  Result Date: 12/08/2018 CLINICAL DATA:  Left lower quadrant pain, nausea, cough EXAM: PORTABLE CHEST 1 VIEW COMPARISON:  12/07/2018 FINDINGS: Low lung volumes with mild left basilar atelectasis. No pleural effusion or pneumothorax. The heart is normal in size. IMPRESSION: No evidence of  acute cardiopulmonary disease. Electronically Signed   By: Julian Hy M.D.   On: 12/08/2018 23:45    Carollee Leitz, MD 12/09/2018, 8:44 AM PGY-1, [place] Intern pager: [phone], text pages welcome  FPTS Upper-Level Resident Addendum   I have independently interviewed and examined the patient. I have discussed the above with the original author and agree with their documentation. My edits for correction/addition/clarification are in green. Please see also any attending notes.    Patriciaann Clan, DO  Family Medicine PGY-2

## 2018-12-09 NOTE — Progress Notes (Addendum)
Pager now working - [phone]

## 2018-12-09 NOTE — ED Notes (Signed)
Admitting MD out room, states she removed O2 to test patient on room air. Will continue to [place]

## 2018-12-09 NOTE — Telephone Encounter (Signed)
Ok orders placed. Holly Mueller can you schedule pt for Covid testing  [name] can you schedule pt for PFT's   Please route to me after so I can have pt come in for inhalers and instructions on how to use them.

## 2018-12-09 NOTE — Telephone Encounter (Signed)
Once patient has been scheduled for PFT I will arrange COVID testing.

## 2018-12-09 NOTE — ED Notes (Signed)
ED TO INPATIENT HANDOFF REPORT  ED Nurse Name and Phone #: Percell Locus, RN  S Name/Age/Gender Holly Mueller 53 y.o. female Room/Bed: 036C/036C  Code Status   Code Status: Full Code  Home/SNF/Other Home Patient oriented to: self, place, time and situation Is this baseline? Yes   Triage Complete: Triage complete  Chief Complaint Lower Left Quadrant Pain  Triage Note Pt with c/o left lower quadrant abdominal pain with radiation to her back since last night. Reports nausea with 3 episodes of nausea. She has a productive cough, denies fever.    Allergies Allergies  Allergen Reactions  . Aspirin Nausea Only    Upset stomach    Level of Care/Admitting Diagnosis ED Disposition    ED Disposition Condition Comment   Admit  Hospital Area: [facility] [[id number]]  Level of Care: Med-Surg [16]  Covid Evaluation: Confirmed COVID Negative  Diagnosis: Acute respiratory failure with hypoxia [facility]) [[id number]]  Admitting Physician: Martyn Malay [[id number]]  Attending Physician: Erin Hearing, MARSHALL L [[id number]]  PT Class (Do Not Modify): Observation [104]  PT Acc Code (Do Not Modify): Observation [10022]       B Medical/Surgery History Past Medical History:  Diagnosis Date  . Acute respiratory failure with hypoxia ([place]) 09/22/2018  . Arthritis    bursitis left hip flares-not an issue  . Chronic kidney disease   . Diabetes mellitus without complication ([place])   . Edentulous    10-19-13 at present  . Epilepsy ([place]) in 1972   No seizures since 1972. Previously treated with phenobarbital.   . Gastric varices with bleeding   . GERD (gastroesophageal reflux disease) 2008  . Headache(784.0)    hx of migraines   . 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 Hypothyroidism 2008  . Lower esophageal ring 08/18/2013  . Pneumonia   . Primary biliary cholangitis ([place]) 10/24/2018  . Seasonal allergies 2003  . SEIZURES, HX OF 12/12/2009   Annotation: last  seizure in early 1970s Qualifier: Diagnosis of  By: Carlena Sax  MD, Colletta Maryland    . Shortness of breath   . Wears glasses    Past Surgical History:  Procedure Laterality Date  . ABDOMINAL HYSTERECTOMY     " partial "  . BALLOON DILATION N/A 10/24/2013   Procedure: BALLOON DILATION;  Surgeon: Gatha Mayer, MD;  Location: [facility];  Service: Endoscopy;  Laterality: N/A;  . BIOPSY  09/26/2018   Procedure: BIOPSY;  Surgeon: Thornton Park, MD;  Location: [facility];  Service: Gastroenterology;;  . CESAREAN SECTION     x2  . DENTAL SURGERY     multiple extractions 3'15  . DILATION AND CURETTAGE OF UTERUS    . ESOPHAGOGASTRODUODENOSCOPY N/A 10/24/2013   Procedure: ESOPHAGOGASTRODUODENOSCOPY (EGD);  Surgeon: Gatha Mayer, MD;  Location: [facility];  Service: Endoscopy;  Laterality: N/A;  . ESOPHAGOGASTRODUODENOSCOPY (EGD) WITH PROPOFOL N/A 09/26/2018   Procedure: ESOPHAGOGASTRODUODENOSCOPY (EGD) WITH PROPOFOL;  Surgeon: Thornton Park, MD;  Location: [facility];  Service: Gastroenterology;  Laterality: N/A;  . IR PARACENTESIS  09/23/2018  . LIVER BIOPSY  06/30/2012   Procedure: LIVER BIOPSY;  Surgeon: Shann Medal, MD;  Location: [facility];  Service: General;;  . NOVASURE ABLATION    . SHOULDER ARTHROSCOPY Right 2011  . UMBILICAL HERNIA REPAIR N/A 06/30/2012   Procedure: remove umbilicus;  Surgeon: Shann Medal, MD;  Location: [facility];  Service: General;  Laterality: N/A;  . VENTRAL HERNIA REPAIR N/A 06/30/2012   Procedure: LAPAROSCOPIC  VENTRAL HERNIA;  Surgeon: Shann Medal, MD;  Location: [facility];  Service: General;  Laterality: N/A;  With Mesh  . VIDEO BRONCHOSCOPY Bilateral 07/20/2018   Procedure: VIDEO BRONCHOSCOPY WITHOUT FLUORO;  Surgeon: Brand Males, MD;  [facility];  Service: Cardiopulmonary;  Laterality: Bilateral;  . WISDOM TOOTH EXTRACTION       A IV Location/Drains/Wounds Patient Lines/Drains/Airways Status   Active Line/Drains/Airways    Name:    Placement date:   Placement time:   Site:   Days:   Peripheral IV 12/09/18 Right Antecubital   12/09/18    0008    Antecubital   less than 1          Intake/Output Last 24 hours No intake or output data in the 24 hours ending 12/09/18 1458  Labs/Imaging Results for orders placed or performed during the hospital encounter of 12/08/18 (from the past 48 hour(s))  Lipase, blood     Status: None   Collection Time: 12/08/18  3:44 PM  Result Value Ref Range   Lipase 39 11 - 51 U/L    Comment: Performed at [facility] [address] [place], [place] [address]  Comprehensive metabolic panel     Status: Abnormal   Collection Time: 12/08/18  3:44 PM  Result Value Ref Range   Sodium 137 135 - 145 mmol/L   Potassium 3.7 3.5 - 5.1 mmol/L   Chloride 108 98 - 111 mmol/L   CO2 20 (L) 22 - 32 mmol/L   Glucose, Bld 101 (H) 70 - 99 mg/dL   BUN <5 (L) 6 - 20 mg/dL   Creatinine, Ser 0.60 0.44 - 1.00 mg/dL   Calcium 8.7 (L) 8.9 - 10.3 mg/dL   Total Protein 7.9 6.5 - 8.1 g/dL   Albumin 2.8 (L) 3.5 - 5.0 g/dL   AST 59 (H) 15 - 41 U/L   ALT 29 0 - 44 U/L   Alkaline Phosphatase 123 38 - 126 U/L   Total Bilirubin 3.4 (H) 0.3 - 1.2 mg/dL   GFR calc non Af Amer >60 >60 mL/min   GFR calc Af Amer >60 >60 mL/min   Anion gap 9 5 - 15    Comment: Performed at [facility] [address] [place], [place] [address]  CBC     Status: Abnormal   Collection Time: 12/08/18  3:44 PM  Result Value Ref Range   WBC 1.9 (L) 4.0 - 10.5 K/uL   RBC 4.40 3.87 - 5.11 MIL/uL   Hemoglobin 12.6 12.0 - 15.0 g/dL   HCT 38.5 36.0 - 46.0 %   MCV 87.5 80.0 - 100.0 fL   MCH 28.6 26.0 - 34.0 pg   MCHC 32.7 30.0 - 36.0 g/dL   RDW 15.8 (H) 11.5 - 15.5 %   Platelets 111 (L) 150 - 400 K/uL    Comment: REPEATED TO VERIFY PLATELET COUNT CONFIRMED BY SMEAR Immature Platelet Fraction may be clinically indicated, consider ordering this additional test [id number]    nRBC 0.0 0.0 - 0.2 %    Comment: Performed at  [facility], [place] [address] [place], [place] [address]  I-Stat beta hCG blood, ED     Status: None   Collection Time: 12/08/18  3:52 PM  Result Value Ref Range   I-stat hCG, quantitative <5.0 <5 mIU/mL   Comment 3            Comment:   GEST. AGE  CONC.  (mIU/mL)   <=1 WEEK        5 - 50     2 WEEKS       50 - 500     3 WEEKS       100 - 10,000     4 WEEKS     1,000 - 30,000        FEMALE AND NON-PREGNANT FEMALE:     LESS THAN 5 mIU/mL   Urinalysis, Routine w reflex microscopic     Status: Abnormal   Collection Time: 12/08/18  7:37 PM  Result Value Ref Range   Color, Urine AMBER (A) YELLOW    Comment: BIOCHEMICALS MAY BE AFFECTED BY COLOR   APPearance HAZY (A) CLEAR   Specific Gravity, Urine 1.016 1.005 - 1.030   pH 6.0 5.0 - 8.0   Glucose, UA NEGATIVE NEGATIVE mg/dL   Hgb urine dipstick LARGE (A) NEGATIVE   Bilirubin Urine NEGATIVE NEGATIVE   Ketones, ur NEGATIVE NEGATIVE mg/dL   Protein, ur 30 (A) NEGATIVE mg/dL   Nitrite NEGATIVE NEGATIVE   Leukocytes,Ua NEGATIVE NEGATIVE   RBC / HPF >50 (H) 0 - 5 RBC/hpf   WBC, UA 0-5 0 - 5 WBC/hpf   Bacteria, UA RARE (A) NONE SEEN   Squamous Epithelial / LPF 6-10 0 - 5   Mucus PRESENT     Comment: Performed at [facility], [address], [place], [place] [address]  SARS Coronavirus 2 (CEPHEID- Performed in [place] [facility] hospital lab), Hosp Order     Status: None   Collection Time: 12/09/18 12:01 AM   Specimen: Nasopharyngeal Swab  Result Value Ref Range   SARS Coronavirus 2 NEGATIVE NEGATIVE    Comment: (NOTE) If result is NEGATIVE SARS-CoV-2 target nucleic acids are NOT DETECTED. The SARS-CoV-2 RNA is generally detectable in upper and lower  respiratory specimens during the acute phase of infection. The lowest  concentration of SARS-CoV-2 viral copies this assay can detect is 250  copies / mL. A negative result does not preclude SARS-CoV-2 infection  and should not be used as the sole basis for treatment or other   patient management decisions.  A negative result may occur with  improper specimen collection / handling, submission of specimen other  than nasopharyngeal swab, presence of viral mutation(s) within the  areas targeted by this assay, and inadequate number of viral copies  (<250 copies / mL). A negative result must be combined with clinical  observations, patient history, and epidemiological information. If result is POSITIVE SARS-CoV-2 target nucleic acids are DETECTED. The SARS-CoV-2 RNA is generally detectable in upper and lower  respiratory specimens dur ing the acute phase of infection.  Positive  results are indicative of active infection with SARS-CoV-2.  Clinical  correlation with patient history and other diagnostic information is  necessary to determine patient infection status.  Positive results do  not rule out bacterial infection or co-infection with other viruses. If result is PRESUMPTIVE POSTIVE SARS-CoV-2 nucleic acids MAY BE PRESENT.   A presumptive positive result was obtained on the submitted specimen  and confirmed on repeat testing.  While 2019 novel coronavirus  (SARS-CoV-2) nucleic acids may be present in the submitted sample  additional confirmatory testing may be necessary for epidemiological  and / or clinical management purposes  to differentiate between  SARS-CoV-2 and other Sarbecovirus currently known to infect humans.  If clinically indicated additional testing with an alternate test  methodology [phone]) is advised. The SARS-CoV-2 RNA  is generally  detectable in upper and lower respiratory sp ecimens during the acute  phase of infection. The expected result is Negative. Fact Sheet for Patients:  StrictlyIdeas.no Fact Sheet for Healthcare Providers: BankingDealers.co.za This test is not yet approved or cleared by the [place] FDA and has been authorized for detection and/or diagnosis of SARS-CoV-2  by FDA under an Emergency Use Authorization (EUA).  This EUA will remain in effect (meaning this test can be used) for the duration of the COVID-19 declaration under Section 564(b)(1) of the Act, 21 U.S.C. section 360bbb-3(b)(1), unless the authorization is terminated or revoked sooner. Performed at [facility], [place] [address] [place], [place] [address]   CBC     Status: Abnormal   Collection Time: 12/09/18  8:33 AM  Result Value Ref Range   WBC 1.3 (LL) 4.0 - 10.5 K/uL    Comment: REPEATED TO VERIFY WHITE COUNT CONFIRMED ON SMEAR THIS CRITICAL RESULT HAS VERIFIED AND BEEN CALLED TO N.GOHZI,RN BY BONNIE DAVIS ON 07 31 2020 AT 0952, AND HAS BEEN READ BACK.     RBC 4.16 3.87 - 5.11 MIL/uL   Hemoglobin 11.6 (L) 12.0 - 15.0 g/dL   HCT 36.0 36.0 - 46.0 %   MCV 86.5 80.0 - 100.0 fL   MCH 27.9 26.0 - 34.0 pg   MCHC 32.2 30.0 - 36.0 g/dL   RDW 15.9 (H) 11.5 - 15.5 %   Platelets 91 (L) 150 - 400 K/uL    Comment: REPEATED TO VERIFY Immature Platelet Fraction may be clinically indicated, consider ordering this additional test [id number] CONSISTENT WITH PREVIOUS RESULT    nRBC 0.0 0.0 - 0.2 %    Comment: Performed at [facility], [place] [address] [place], [place] [address]  Creatinine, serum     Status: None   Collection Time: 12/09/18  8:33 AM  Result Value Ref Range   Creatinine, Ser 0.78 0.44 - 1.00 mg/dL   GFR calc non Af Amer >60 >60 mL/min   GFR calc Af Amer >60 >60 mL/min    Comment: Performed at [facility] [address] [place], [place] [address]  Hemoglobin A1c     Status: None   Collection Time: 12/09/18 12:30 PM  Result Value Ref Range   Hgb A1c MFr Bld 4.8 4.8 - 5.6 %    Comment: (NOTE) Pre diabetes:          5.7%-6.4% Diabetes:              >6.4% Glycemic control for   <7.0% adults with diabetes    Mean Plasma Glucose 91.06 mg/dL    Comment: Performed at [facility] [address] [place], [place] [address]   Dg Chest 2  View  Result Date: 12/07/2018 CLINICAL DATA:  Shortness of breath, fever EXAM: CHEST - 2 VIEW COMPARISON:  11/20/2018 FINDINGS: The heart mediastinum are normal in size. Low volume examination with mild diffuse interstitial pulmonary opacity. The osseous structures are unremarkable. IMPRESSION: Low volume examination with mild diffuse interstitial pulmonary opacity. This may reflect mild edema, atypical infection, and/or bronchovascular crowding. There is no focal airspace opacity. Electronically Signed   By: Eddie Candle M.D.   On: 12/07/2018 17:08   Ct Abdomen Pelvis W Contrast  Result Date: 12/09/2018 CLINICAL DATA:  Left lower quadrant pain radiating to back EXAM: CT ABDOMEN AND PELVIS WITH CONTRAST TECHNIQUE: Multidetector CT imaging of the abdomen and pelvis was performed using the standard protocol  following bolus administration of intravenous contrast. CONTRAST:  151m OMNIPAQUE IOHEXOL 300 MG/ML  SOLN COMPARISON:  09/23/2018 FINDINGS: Lower chest: Scarring/atelectasis in the lingula, right middle lobe, and bilateral lower lobes. Hepatobiliary: Cirrhosis.  No suspicious hepatic lesions. Gallbladder is unremarkable. No intrahepatic or extrahepatic ductal dilatation. Pancreas: Within normal limits. Spleen: Splenomegaly, measuring 23.1 cm in maximal craniocaudal dimension (sagittal image 142). Adrenals/Urinary Tract: Adrenal glands are within normal limits. Four nonobstructing right renal calculi measuring up to 5 mm in the right lower pole (series 3/image 43). Additional 4 mm nonobstructing left lower pole renal calculus (series 3/image 60). No hydronephrosis. Bladder is thick-walled although underdistended. Stomach/Bowel: Stomach is mildly thick-walled although grossly unremarkable. No evidence of bowel obstruction. Normal appendix (series 3/image 64). Vascular/Lymphatic: No evidence of abdominal aortic aneurysm. Portal vein is patent. Large perigastric varices with suspected post treatment changes  related to prior gastric varix embolization. Small upper abdominal lymph nodes, likely reactive. No suspicious pelvic lymphadenopathy. Reproductive: Uterus is within normal limits. Bilateral ovaries are within normal limits. Other: No abdominopelvic ascites. Musculoskeletal: Grade 1 spondylolisthesis at L5-S1. Mild degenerative changes of the visualized thoracolumbar spine. IMPRESSION: Bilateral nonobstructing renal calculi measuring up to 5 mm. No hydronephrosis. Cirrhosis. Splenomegaly. Large perigastric varices with post treatment changes related to prior gastric varix embolization. Portal vein is patent. No abdominopelvic ascites. Additional ancillary findings as above. Electronically Signed   By: SJulian HyM.D.   On: 12/09/2018 03:04   Dg Chest Port 1 View  Result Date: 12/08/2018 CLINICAL DATA:  Left lower quadrant pain, nausea, cough EXAM: PORTABLE CHEST 1 VIEW COMPARISON:  12/07/2018 FINDINGS: Low lung volumes with mild left basilar atelectasis. No pleural effusion or pneumothorax. The heart is normal in size. IMPRESSION: No evidence of acute cardiopulmonary disease. Electronically Signed   By: SJulian HyM.D.   On: 12/08/2018 23:45    Pending Labs Unresulted Labs (From admission, onward)    Start     Ordered   12/16/18 0500  Creatinine, serum  (enoxaparin (LOVENOX)    CrCl >/= 30 ml/min)  Weekly,   R    Comments: while on enoxaparin therapy    12/09/18 0716   12/09/18 1005  Hypersensitivity Pneumonitis  Once,   STAT     12/09/18 1005   12/09/18 0834  C difficile quick scan w PCR reflex  (C Difficile quick screen w PCR reflex panel)  Once, for 24 hours,   STAT     12/09/18 0834   12/09/18 0401  Urine culture  ONCE - STAT,   AD     12/09/18 0400          Vitals/Pain Today's Vitals   12/09/18 1230 12/09/18 1330 12/09/18 1400 12/09/18 1430  BP: 117/64 120/65 118/88 127/73  Pulse: 95 88 86   Resp: (!) 23 20 (!) 30 (!) 29  Temp:      TempSrc:      SpO2: 97% 96% 97%    PainSc:        Isolation Precautions Enteric precautions (UV disinfection)  Medications Medications  levothyroxine (SYNTHROID) tablet 250 mcg (250 mcg Oral Given 12/09/18 1024)  pantoprazole (PROTONIX) EC tablet 40 mg (40 mg Oral Given 12/09/18 0827)  ursodiol (ACTIGALL) capsule 600 mg (600 mg Oral Given 12/09/18 1025)  loratadine (CLARITIN) tablet 10 mg (10 mg Oral Given 12/09/18 1025)  fluticasone (FLONASE) 50 MCG/ACT nasal spray 2 spray (2 sprays Each Nare Given 12/09/18 1029)  mometasone-formoterol (DULERA) 100-5 MCG/ACT inhaler 2 puff (2 puffs Inhalation Given  12/09/18 1028)  umeclidinium bromide (INCRUSE ELLIPTA) 62.5 MCG/INH 1 puff (1 puff Inhalation Given 12/09/18 1030)  albuterol (PROVENTIL) (2.5 MG/3ML) 0.083% nebulizer solution 2.5 mg (has no administration in time range)  enoxaparin (LOVENOX) injection 40 mg (has no administration in time range)  acetaminophen (TYLENOL) tablet 650 mg (650 mg Oral Given 12/09/18 0831)    Or  acetaminophen (TYLENOL) suppository 650 mg ( Rectal See Alternative 12/09/18 0831)  sodium chloride flush (NS) 0.9 % injection 3 mL (3 mLs Intravenous Given 12/09/18 0014)  ondansetron (ZOFRAN-ODT) disintegrating tablet 4 mg (4 mg Oral Given 12/08/18 1543)  morphine 4 MG/ML injection 4 mg (4 mg Intravenous Given 12/09/18 0009)  iohexol (OMNIPAQUE) 300 MG/ML solution 100 mL (100 mLs Intravenous Contrast Given 12/09/18 0206)  albuterol (PROVENTIL) (2.5 MG/3ML) 0.083% nebulizer solution 5 mg (5 mg Nebulization Given 12/09/18 0411)  methylPREDNISolone sodium succinate (SOLU-MEDROL) 125 mg/2 mL injection 125 mg (125 mg Intravenous Given 12/09/18 0412)  albuterol (PROVENTIL) (2.5 MG/3ML) 0.083% nebulizer solution 5 mg (5 mg Nebulization Given 12/09/18 0506)  ipratropium (ATROVENT) nebulizer solution 0.5 mg (0.5 mg Nebulization Given 12/09/18 0512)    Mobility walks Low fall risk   Focused Assessments Pulmonary Assessment Handoff:  O2 Device: Nasal Cannula O2 Flow  Rate (L/min): 2 L/min      R Recommendations: See Admitting Provider Note  Report given to:   Additional Notes:

## 2018-12-09 NOTE — ED Notes (Signed)
Admitting provider at bedside.

## 2018-12-09 NOTE — ED Notes (Signed)
ED TO INPATIENT HANDOFF REPORT  ED Nurse Name and Phone #:  [phone]  S Name/Age/Gender Holly Mueller 53 y.o. female Room/Bed: 020C/020C  Code Status   Code Status: Prior  Home/SNF/Other Home Patient oriented to: self, place, time and situation Is this baseline? Yes   Triage Complete: Triage complete  Chief Complaint Lower Left Quadrant Pain  Triage Note Pt with c/o left lower quadrant abdominal pain with radiation to her back since last night. Reports nausea with 3 episodes of nausea. She has a productive cough, denies fever.    Allergies Allergies  Allergen Reactions  . Aspirin Nausea Only    Upset stomach    Level of Care/Admitting Diagnosis ED Disposition    ED Disposition Condition Comment   Admit  The patient appears reasonably stabilized for admission considering the current resources, flow, and capabilities available in the ED at this time, and I doubt any other [facility] requiring further screening and/or treatment in the ED prior to admission is  present.       B Medical/Surgery History Past Medical History:  Diagnosis Date  . Acute respiratory failure with hypoxia ([place]) 09/22/2018  . Arthritis    bursitis left hip flares-not an issue  . Chronic kidney disease   . Diabetes mellitus without complication ([place])   . Edentulous    10-19-13 at present  . Epilepsy ([place]) in 1972   No seizures since 1972. Previously treated with phenobarbital.   . Gastric varices with bleeding   . GERD (gastroesophageal reflux disease) 2008  . Headache(784.0)    hx of migraines   . 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 Hypothyroidism 2008  . Lower esophageal ring 08/18/2013  . Pneumonia   . Primary biliary cholangitis ([place]) 10/24/2018  . Seasonal allergies 2003  . SEIZURES, HX OF 12/12/2009   Annotation: last seizure in early 1970s Qualifier: Diagnosis of  By: Carlena Sax  MD, Colletta [place]    . Shortness of breath   . Wears glasses    Past Surgical  History:  Procedure Laterality Date  . ABDOMINAL HYSTERECTOMY     " partial "  . BALLOON DILATION N/A 10/24/2013   Procedure: BALLOON DILATION;  Surgeon: Gatha Mayer, MD;  Location: [facility];  Service: Endoscopy;  Laterality: N/A;  . BIOPSY  09/26/2018   Procedure: BIOPSY;  Surgeon: Thornton Park, MD;  Location: [place];  Service: Gastroenterology;;  . CESAREAN SECTION     x2  . DENTAL SURGERY     multiple extractions 3'15  . DILATION AND CURETTAGE OF UTERUS    . ESOPHAGOGASTRODUODENOSCOPY N/A 10/24/2013   Procedure: ESOPHAGOGASTRODUODENOSCOPY (EGD);  Surgeon: Gatha Mayer, MD;  Location: [facility];  Service: Endoscopy;  Laterality: N/A;  . ESOPHAGOGASTRODUODENOSCOPY (EGD) WITH PROPOFOL N/A 09/26/2018   Procedure: ESOPHAGOGASTRODUODENOSCOPY (EGD) WITH PROPOFOL;  Surgeon: Thornton Park, MD;  Location: [facility];  Service: Gastroenterology;  Laterality: N/A;  . IR PARACENTESIS  09/23/2018  . LIVER BIOPSY  06/30/2012   Procedure: LIVER BIOPSY;  Surgeon: Shann Medal, MD;  Location: [facility];  Service: General;;  . NOVASURE ABLATION    . SHOULDER ARTHROSCOPY Right 2011  . UMBILICAL HERNIA REPAIR N/A 06/30/2012   Procedure: remove umbilicus;  Surgeon: Shann Medal, MD;  Location[facility];  Service: General;  Laterality: N/A;  . VENTRAL HERNIA REPAIR N/A 06/30/2012   Procedure: LAPAROSCOPIC VENTRAL HERNIA;  Surgeon: Shann Medal, MD;  Location[facility];  Service: General;  Laterality: N/A;  With Mesh  . VIDEO BRONCHOSCOPY Bilateral 07/20/2018   Procedure: VIDEO BRONCHOSCOPY WITHOUT FLUORO;  Surgeon: Brand Males, MD;  [facility];  Service: Cardiopulmonary;  Laterality: Bilateral;  . WISDOM TOOTH EXTRACTION       A IV Location/Drains/Wounds Patient Lines/Drains/Airways Status   Active Line/Drains/Airways    Name:   Placement date:   Placement time:   Site:   Days:   Peripheral IV 12/09/18 Right Antecubital   12/09/18    0008    Antecubital   less  than 1          Intake/Output Last 24 hours No intake or output data in the 24 hours ending 12/09/18 3875  Labs/Imaging Results for orders placed or performed during the hospital encounter of 12/08/18 (from the past 48 hour(s))  Lipase, blood     Status: None   Collection Time: 12/08/18  3:44 PM  Result Value Ref Range   Lipase 39 11 - 51 U/L    Comment: Performed at [facility] [address] [place], [place] [address]  Comprehensive metabolic panel     Status: Abnormal   Collection Time: 12/08/18  3:44 PM  Result Value Ref Range   Sodium 137 135 - 145 mmol/L   Potassium 3.7 3.5 - 5.1 mmol/L   Chloride 108 98 - 111 mmol/L   CO2 20 (L) 22 - 32 mmol/L   Glucose, Bld 101 (H) 70 - 99 mg/dL   BUN <5 (L) 6 - 20 mg/dL   Creatinine, Ser 0.60 0.44 - 1.00 mg/dL   Calcium 8.7 (L) 8.9 - 10.3 mg/dL   Total Protein 7.9 6.5 - 8.1 g/dL   Albumin 2.8 (L) 3.5 - 5.0 g/dL   AST 59 (H) 15 - 41 U/L   ALT 29 0 - 44 U/L   Alkaline Phosphatase 123 38 - 126 U/L   Total Bilirubin 3.4 (H) 0.3 - 1.2 mg/dL   GFR calc non Af Amer >60 >60 mL/min   GFR calc Af Amer >60 >60 mL/min   Anion gap 9 5 - 15    Comment: Performed at [facility] [address], [place], [place] [address]  CBC     Status: Abnormal   Collection Time: 12/08/18  3:44 PM  Result Value Ref Range   WBC 1.9 (L) 4.0 - 10.5 K/uL   RBC 4.40 3.87 - 5.11 MIL/uL   Hemoglobin 12.6 12.0 - 15.0 g/dL   HCT 38.5 36.0 - 46.0 %   MCV 87.5 80.0 - 100.0 fL   MCH 28.6 26.0 - 34.0 pg   MCHC 32.7 30.0 - 36.0 g/dL   RDW 15.8 (H) 11.5 - 15.5 %   Platelets 111 (L) 150 - 400 K/uL    Comment: REPEATED TO VERIFY PLATELET COUNT CONFIRMED BY SMEAR Immature Platelet Fraction may be clinically indicated, consider ordering this additional test [id number]    nRBC 0.0 0.0 - 0.2 %    Comment: Performed at [facility], [place] [address], [place], [place] [address]  I-Stat beta hCG blood, ED     Status: None   Collection Time: 12/08/18   3:52 PM  Result Value Ref Range   I-stat hCG, quantitative <5.0 <5 mIU/mL   Comment 3            Comment:   GEST. AGE      CONC.  (mIU/mL)   <=1 WEEK        5 - 50  2 WEEKS       50 - 500     3 WEEKS       100 - 10,000     4 WEEKS     1,000 - 30,000        FEMALE AND NON-PREGNANT FEMALE:     LESS THAN 5 mIU/mL   Urinalysis, Routine w reflex microscopic     Status: Abnormal   Collection Time: 12/08/18  7:37 PM  Result Value Ref Range   Color, Urine AMBER (A) YELLOW    Comment: BIOCHEMICALS MAY BE AFFECTED BY COLOR   APPearance HAZY (A) CLEAR   Specific Gravity, Urine 1.016 1.005 - 1.030   pH 6.0 5.0 - 8.0   Glucose, UA NEGATIVE NEGATIVE mg/dL   Hgb urine dipstick LARGE (A) NEGATIVE   Bilirubin Urine NEGATIVE NEGATIVE   Ketones, ur NEGATIVE NEGATIVE mg/dL   Protein, ur 30 (A) NEGATIVE mg/dL   Nitrite NEGATIVE NEGATIVE   Leukocytes,Ua NEGATIVE NEGATIVE   RBC / HPF >50 (H) 0 - 5 RBC/hpf   WBC, UA 0-5 0 - 5 WBC/hpf   Bacteria, UA RARE (A) NONE SEEN   Squamous Epithelial / LPF 6-10 0 - 5   Mucus PRESENT     Comment: Performed at [facility], [address], [place], [place] [address]  SARS Coronavirus 2 (CEPHEID- Performed in [facility] hospital lab), Hosp Order     Status: None   Collection Time: 12/09/18 12:01 AM   Specimen: Nasopharyngeal Swab  Result Value Ref Range   SARS Coronavirus 2 NEGATIVE NEGATIVE    Comment: (NOTE) If result is NEGATIVE SARS-CoV-2 target nucleic acids are NOT DETECTED. The SARS-CoV-2 RNA is generally detectable in upper and lower  respiratory specimens during the acute phase of infection. The lowest  concentration of SARS-CoV-2 viral copies this assay can detect is 250  copies / mL. A negative result does not preclude SARS-CoV-2 infection  and should not be used as the sole basis for treatment or other  patient management decisions.  A negative result may occur with  improper specimen collection / handling, submission of specimen other   than nasopharyngeal swab, presence of viral mutation(s) within the  areas targeted by this assay, and inadequate number of viral copies  (<250 copies / mL). A negative result must be combined with clinical  observations, patient history, and epidemiological information. If result is POSITIVE SARS-CoV-2 target nucleic acids are DETECTED. The SARS-CoV-2 RNA is generally detectable in upper and lower  respiratory specimens dur ing the acute phase of infection.  Positive  results are indicative of active infection with SARS-CoV-2.  Clinical  correlation with patient history and other diagnostic information is  necessary to determine patient infection status.  Positive results do  not rule out bacterial infection or co-infection with other viruses. If result is PRESUMPTIVE POSTIVE SARS-CoV-2 nucleic acids MAY BE PRESENT.   A presumptive positive result was obtained on the submitted specimen  and confirmed on repeat testing.  While 2019 novel coronavirus  (SARS-CoV-2) nucleic acids may be present in the submitted sample  additional confirmatory testing may be necessary for epidemiological  and / or clinical management purposes  to differentiate between  SARS-CoV-2 and other Sarbecovirus currently known to infect humans.  If clinically indicated additional testing with an alternate test  methodology [phone]) is advised. The SARS-CoV-2 RNA is generally  detectable in upper and lower respiratory sp ecimens during the acute  phase of infection. The expected result  is Negative. Fact Sheet for Patients:  StrictlyIdeas.no Fact Sheet for Healthcare Providers: BankingDealers.co.za This test is not yet approved or cleared by the [place] FDA and has been authorized for detection and/or diagnosis of SARS-CoV-2 by FDA under an Emergency Use Authorization (EUA).  This EUA will remain in effect (meaning this test can be used) for the duration of  the COVID-19 declaration under Section 564(b)(1) of the Act, 21 U.S.C. section 360bbb-3(b)(1), unless the authorization is terminated or revoked sooner. Performed at [facility], [place] [address], [place], [place] [address]    Dg Chest 2 View  Result Date: 12/07/2018 CLINICAL DATA:  Shortness of breath, fever EXAM: CHEST - 2 VIEW COMPARISON:  11/20/2018 FINDINGS: The heart mediastinum are normal in size. Low volume examination with mild diffuse interstitial pulmonary opacity. The osseous structures are unremarkable. IMPRESSION: Low volume examination with mild diffuse interstitial pulmonary opacity. This may reflect mild edema, atypical infection, and/or bronchovascular crowding. There is no focal airspace opacity. Electronically Signed   By: Eddie Candle M.D.   On: 12/07/2018 17:08   Ct Abdomen Pelvis W Contrast  Result Date: 12/09/2018 CLINICAL DATA:  Left lower quadrant pain radiating to back EXAM: CT ABDOMEN AND PELVIS WITH CONTRAST TECHNIQUE: Multidetector CT imaging of the abdomen and pelvis was performed using the standard protocol following bolus administration of intravenous contrast. CONTRAST:  19m OMNIPAQUE IOHEXOL 300 MG/ML  SOLN COMPARISON:  09/23/2018 FINDINGS: Lower chest: Scarring/atelectasis in the lingula, right middle lobe, and bilateral lower lobes. Hepatobiliary: Cirrhosis.  No suspicious hepatic lesions. Gallbladder is unremarkable. No intrahepatic or extrahepatic ductal dilatation. Pancreas: Within normal limits. Spleen: Splenomegaly, measuring 23.1 cm in maximal craniocaudal dimension (sagittal image 142). Adrenals/Urinary Tract: Adrenal glands are within normal limits. Four nonobstructing right renal calculi measuring up to 5 mm in the right lower pole (series 3/image 43). Additional 4 mm nonobstructing left lower pole renal calculus (series 3/image 60). No hydronephrosis. Bladder is thick-walled although underdistended. Stomach/Bowel: Stomach is mildly thick-walled  although grossly unremarkable. No evidence of bowel obstruction. Normal appendix (series 3/image 64). Vascular/Lymphatic: No evidence of abdominal aortic aneurysm. Portal vein is patent. Large perigastric varices with suspected post treatment changes related to prior gastric varix embolization. Small upper abdominal lymph nodes, likely reactive. No suspicious pelvic lymphadenopathy. Reproductive: Uterus is within normal limits. Bilateral ovaries are within normal limits. Other: No abdominopelvic ascites. Musculoskeletal: Grade 1 spondylolisthesis at L5-S1. Mild degenerative changes of the visualized thoracolumbar spine. IMPRESSION: Bilateral nonobstructing renal calculi measuring up to 5 mm. No hydronephrosis. Cirrhosis. Splenomegaly. Large perigastric varices with post treatment changes related to prior gastric varix embolization. Portal vein is patent. No abdominopelvic ascites. Additional ancillary findings as above. Electronically Signed   By: SJulian HyM.D.   On: 12/09/2018 03:04   Dg Chest Port 1 View  Result Date: 12/08/2018 CLINICAL DATA:  Left lower quadrant pain, nausea, cough EXAM: PORTABLE CHEST 1 VIEW COMPARISON:  12/07/2018 FINDINGS: Low lung volumes with mild left basilar atelectasis. No pleural effusion or pneumothorax. The heart is normal in size. IMPRESSION: No evidence of acute cardiopulmonary disease. Electronically Signed   By: SJulian HyM.D.   On: 12/08/2018 23:45    Pending Labs Unresulted Labs (From admission, onward)    Start     Ordered   12/09/18 0401  Urine culture  ONCE - STAT,   STAT     12/09/18 0400          Vitals/Pain Today's Vitals   12/09/18 0430 12/09/18  0512 12/09/18 0530 12/09/18 0600  BP: 110/69 113/67 122/75 122/77  Pulse: 91 85 (!) 108 (!) 111  Resp: (!) 22 (!) 22 (!) 21 (!) 22  Temp:      TempSrc:      SpO2: 96% 98% 94% 93%  PainSc:  0-No pain      Isolation Precautions Airborne and Contact  precautions  Medications Medications  sodium chloride flush (NS) 0.9 % injection 3 mL (3 mLs Intravenous Given 12/09/18 0014)  ondansetron (ZOFRAN-ODT) disintegrating tablet 4 mg (4 mg Oral Given 12/08/18 1543)  morphine 4 MG/ML injection 4 mg (4 mg Intravenous Given 12/09/18 0009)  iohexol (OMNIPAQUE) 300 MG/ML solution 100 mL (100 mLs Intravenous Contrast Given 12/09/18 0206)  albuterol (PROVENTIL) (2.5 MG/3ML) 0.083% nebulizer solution 5 mg (5 mg Nebulization Given 12/09/18 0411)  methylPREDNISolone sodium succinate (SOLU-MEDROL) 125 mg/2 mL injection 125 mg (125 mg Intravenous Given 12/09/18 0412)  albuterol (PROVENTIL) (2.5 MG/3ML) 0.083% nebulizer solution 5 mg (5 mg Nebulization Given 12/09/18 0506)  ipratropium (ATROVENT) nebulizer solution 0.5 mg (0.5 mg Nebulization Given 12/09/18 0512)    Mobility walks Low fall risk   Focused Assessments Cardiac Assessment Handoff:  Cardiac Rhythm: Normal sinus rhythm Lab Results  Component Value Date   CKTOTAL 48 11/21/2018   TROPONINI <0.03 09/22/2018   Lab Results  Component Value Date   DDIMER 4.43 (H) 09/22/2018   Does the Patient currently have chest pain? No  , Pulmonary Assessment Handoff:  Lung sounds:   O2 Device: Nasal Cannula O2 Flow Rate (L/min): 2 L/min      R Recommendations: See Admitting Provider Note  Report given to:   Additional Notes: Chronic interstitial lung disease

## 2018-12-10 DIAGNOSIS — K219 Gastro-esophageal reflux disease without esophagitis: Secondary | ICD-10-CM | POA: Diagnosis present

## 2018-12-10 DIAGNOSIS — Z87891 Personal history of nicotine dependence: Secondary | ICD-10-CM | POA: Diagnosis not present

## 2018-12-10 DIAGNOSIS — J849 Interstitial pulmonary disease, unspecified: Secondary | ICD-10-CM | POA: Diagnosis not present

## 2018-12-10 DIAGNOSIS — Z7951 Long term (current) use of inhaled steroids: Secondary | ICD-10-CM | POA: Diagnosis not present

## 2018-12-10 DIAGNOSIS — Z598 Other problems related to housing and economic circumstances: Secondary | ICD-10-CM | POA: Diagnosis not present

## 2018-12-10 DIAGNOSIS — E1122 Type 2 diabetes mellitus with diabetic chronic kidney disease: Secondary | ICD-10-CM | POA: Diagnosis present

## 2018-12-10 DIAGNOSIS — N200 Calculus of kidney: Secondary | ICD-10-CM | POA: Diagnosis present

## 2018-12-10 DIAGNOSIS — K743 Primary biliary cirrhosis: Secondary | ICD-10-CM | POA: Diagnosis present

## 2018-12-10 DIAGNOSIS — Z79899 Other long term (current) drug therapy: Secondary | ICD-10-CM | POA: Diagnosis not present

## 2018-12-10 DIAGNOSIS — Z8701 Personal history of pneumonia (recurrent): Secondary | ICD-10-CM | POA: Diagnosis not present

## 2018-12-10 DIAGNOSIS — Z7722 Contact with and (suspected) exposure to environmental tobacco smoke (acute) (chronic): Secondary | ICD-10-CM | POA: Diagnosis present

## 2018-12-10 DIAGNOSIS — R0602 Shortness of breath: Secondary | ICD-10-CM | POA: Diagnosis present

## 2018-12-10 DIAGNOSIS — E039 Hypothyroidism, unspecified: Secondary | ICD-10-CM | POA: Diagnosis present

## 2018-12-10 DIAGNOSIS — Z79890 Hormone replacement therapy (postmenopausal): Secondary | ICD-10-CM | POA: Diagnosis not present

## 2018-12-10 DIAGNOSIS — J302 Other seasonal allergic rhinitis: Secondary | ICD-10-CM | POA: Diagnosis present

## 2018-12-10 DIAGNOSIS — R319 Hematuria, unspecified: Secondary | ICD-10-CM | POA: Diagnosis present

## 2018-12-10 DIAGNOSIS — N189 Chronic kidney disease, unspecified: Secondary | ICD-10-CM | POA: Diagnosis present

## 2018-12-10 DIAGNOSIS — Z801 Family history of malignant neoplasm of trachea, bronchus and lung: Secondary | ICD-10-CM | POA: Diagnosis not present

## 2018-12-10 DIAGNOSIS — Z9981 Dependence on supplemental oxygen: Secondary | ICD-10-CM | POA: Diagnosis not present

## 2018-12-10 DIAGNOSIS — D7589 Other specified diseases of blood and blood-forming organs: Secondary | ICD-10-CM | POA: Diagnosis present

## 2018-12-10 DIAGNOSIS — Z597 Insufficient social insurance and welfare support: Secondary | ICD-10-CM | POA: Diagnosis not present

## 2018-12-10 DIAGNOSIS — Z7984 Long term (current) use of oral hypoglycemic drugs: Secondary | ICD-10-CM | POA: Diagnosis not present

## 2018-12-10 DIAGNOSIS — K5904 Chronic idiopathic constipation: Secondary | ICD-10-CM

## 2018-12-10 DIAGNOSIS — Z20828 Contact with and (suspected) exposure to other viral communicable diseases: Secondary | ICD-10-CM | POA: Diagnosis present

## 2018-12-10 DIAGNOSIS — J9601 Acute respiratory failure with hypoxia: Secondary | ICD-10-CM | POA: Diagnosis present

## 2018-12-10 DIAGNOSIS — Z77120 Contact with and (suspected) exposure to mold (toxic): Secondary | ICD-10-CM | POA: Diagnosis not present

## 2018-12-10 LAB — URINE CULTURE: Culture: 30000 — AB

## 2018-12-10 MED ORDER — POLYETHYLENE GLYCOL 3350 17 G PO PACK
17.0000 g | PACK | Freq: Two times a day (BID) | ORAL | Status: DC
  Administered 2018-12-10: 17 g via ORAL
  Filled 2018-12-10 (×3): qty 1

## 2018-12-10 NOTE — Evaluation (Signed)
Physical Therapy Evaluation Patient Details Name: Holly Mueller MRN: [id number] DOB: 04-15-1966 Today's Date: 12/10/2018   History of Present Illness  53 y/o female was admitted for acute respiratory failure and had abd pain, covid negative.  Cleared for c-diff, possible diverticulitis.  PMHx:  ILD, gastric varices, primary biliary cholangitis, hepatic cirrhosis, GERD, DM, hypothyroidism, bi-cytopenia  Clinical Impression  Pt was given a 6 minute walk test during which time she went 120' with 1 min 16 second time during which O2 sat dropped to 87% on room air.  Remained at 88% during the rest of the walk to return to her room.  Pt is SOB with effort, may need supplemental O2.  Follow acutely and work on her need for O2 with walking trials as she tolerates.  Her home situation is unknown, but if it will require a great deal of effort to navigate it, will assess this as it is known.    Follow Up Recommendations No PT follow up    Equipment Recommendations  None recommended by PT    Recommendations for Other Services       Precautions / Restrictions Precautions Precautions: Fall;Other (comment)(monitor O2 sats) Restrictions Weight Bearing Restrictions: No      Mobility  Bed Mobility Overal bed mobility: Modified Independent                Transfers Overall transfer level: Modified independent Equipment used: None                Ambulation/Gait Ambulation/Gait assistance: Min guard(for safety) Gait Distance (Feet): 300 Feet(150 x 2) Assistive device: None Gait Pattern/deviations: Step-through pattern;Wide base of support;Drifts right/left Gait velocity: Decreased  Gait velocity interpretation: <1.31 ft/sec, indicative of household ambulator General Gait Details: drifts to R side at times but no complete LOB  Stairs Stairs: Yes Stairs assistance: Min guard Stair Management: One rail Right;Forwards;Step to pattern Number of Stairs: 5 General stair  comments: pt was stable with wall rail  Wheelchair Mobility    Modified Rankin (Stroke Patients Only)       Balance     Sitting balance-Leahy Scale: Good       Standing balance-Leahy Scale: Fair                               Pertinent Vitals/Pain Pain Assessment: No/denies pain    Home Living Family/patient expects to be discharged to:: Private residence Living Arrangements: Children;Non-relatives/Friends Available Help at Discharge: Family;Available 24 hours/day Type of Home: House Home Access: Stairs to enter Entrance Stairs-Rails: None Entrance Stairs-Number of Steps: 2 Home Layout: One level Home Equipment: Walker - 2 wheels;Bedside commode Additional Comments: pt cannot return here due to her house having mold    Prior Function Level of Independence: Independent               Hand Dominance        Extremity/Trunk Assessment   Upper Extremity Assessment Upper Extremity Assessment: Overall WFL for tasks assessed    Lower Extremity Assessment Lower Extremity Assessment: Generalized weakness    Cervical / Trunk Assessment Cervical / Trunk Assessment: Normal  Communication   Communication: No difficulties  Cognition Arousal/Alertness: Awake/alert Behavior During Therapy: WFL for tasks assessed/performed Overall Cognitive Status: Within Functional Limits for tasks assessed  General Comments General comments (skin integrity, edema, etc.): pt is able to walk with no AD but does drift to R at times, no outright LOB    Exercises     Assessment/Plan    PT Assessment Patient needs continued PT services  PT Problem List Decreased strength;Decreased range of motion;Decreased activity tolerance;Decreased balance;Decreased mobility;Cardiopulmonary status limiting activity;Obesity       PT Treatment Interventions DME instruction;Gait training;Functional mobility training;Stair  training;Therapeutic activities;Therapeutic exercise;Balance training;Neuromuscular re-education;Patient/family education    PT Goals (Current goals can be found in the Care Plan section)  Acute Rehab PT Goals Patient Stated Goal: to get stronger PT Goal Formulation: With patient Time For Goal Achievement: 12/24/18 Potential to Achieve Goals: Good    Frequency Min 3X/week   Barriers to discharge Other (comment) pt is homeless now    Co-evaluation               AM-PAC PT "6 Clicks" Mobility  Outcome Measure Help needed turning from your back to your side while in a flat bed without using bedrails?: None Help needed moving from lying on your back to sitting on the side of a flat bed without using bedrails?: None Help needed moving to and from a bed to a chair (including a wheelchair)?: A Little Help needed standing up from a chair using your arms (e.g., wheelchair or bedside chair)?: A Little Help needed to walk in hospital room?: A Little Help needed climbing 3-5 steps with a railing? : A Little 6 Click Score: 20    End of Session Equipment Utilized During Treatment: Gait belt Activity Tolerance: Patient tolerated treatment well;Treatment limited secondary to medical complications (Comment) Patient left: in bed;with call bell/phone within reach;with nursing/sitter in room Nurse Communication: Mobility status PT Visit Diagnosis: Unsteadiness on feet (R26.81);Muscle weakness (generalized) (M62.81)    Time: 1403-1420(+ 1448 to 1505) PT Time Calculation (min) (ACUTE ONLY): 17 min   Charges:   PT Evaluation $PT Eval Moderate Complexity: 1 Mod PT Treatments $Gait Training: 8-22 mins       Ramond Dial 12/10/2018, 3:57 PM   Mee Hives, PT MS Acute Rehab Dept. Number: [place] and [place]

## 2018-12-10 NOTE — Progress Notes (Addendum)
Family Medicine Teaching Service Daily Progress Note Intern Pager: [phone]  Patient name: Holly Mueller Medical record number: [id number] Date of birth: [birth year] Age: 53 y.o. Gender: female  Primary Care Provider: Cleophas Dunker, DO Consultants: None Code Status: Full   Pt Overview and Major Events to Date:  Admitted from ED on 12/09/18  Assessment and Plan: Holly Mueller is a 53 y.o. female presenting with abdominal pain and shortness of breath . PMH is significant for ILD, Gastric Varices, Primary [place] Cholangitis with Hepatic Cirrhosis, GERD, Hypothyroidism, DM type II.  Progressive DOE with reported hypoxia, in setting of suspected ILD: Pt reports no worsening shortness of breathing since last admission, however has been progressing over the last month.  Associated with productive cough, mild fever (max 100.6), chills, poor appetite.  Recently seen by her pulmonologist on 7/29, who recommended future further work-up including HRCT, pneumonitis labs, and full PFTs.  Additionally was going to switch her inhaler regimen.  Patient noted to be hypoxic during initial ED evaluation requiring 2L [place], however not documented. RR 18-23 HR 87-92 (108-111, post aerosol therapy).  Unlabored breathing, satting appropriately at rest on RA, do note she becomes tachypneic with movement. Fine bibasilar crackles consistent with ILD. CXR without acute cardiopulmonary disease.  Concern via her pulmonologist team has been secondary to ILD with autoimmune features vs idiopathic pulmonary fibrosis vs NSIP. Considered pneumonia, but as patient was recently admitted from 7/12-7/16 with CAP and full appropriate antibiotic course with a clear CXR, believe this is less likely.  Reassured she is COVID negative today, however may consider still the differential as she has some, while nonspecific, symptoms could be consistent with this. Well's score for PE 0, unlikely.  -Admit to Med Surg, Attending Dr.  Erin Hearing -maintain oxygen 90-97% -Albuterol q4h prn -Start Dulera 2puffs BID and Umeclidinium bromide 1puff daily (formula equivalents to inhaler regimen pulmonologist wanted patient to start on outpatient) -PT, 6-minute walk test, assess need for O2 -FYI'd pulmonology, Dr. Chase Caller, to assess if they would like any additional work-up while inpatient: After discussion, we will move forward with supine/prone HRCT, spirometry with DLCO, and hypersensitivity pneumonitis panel  - HCRT completed but report indicates it needs to be redone when pulmonary function is greater.   Acute Abdominal Pain- stable pt reports sharp LLQ abdominal pain that radiates to lower back.  Physical exam unremarkable, s/p morphine in the ED. hx Kidney stones. CT Abdomen showed bilateral non obstructing renal calculi measuring 5 mm, no hydronephrosis without evidence of diverticulitis. Small upper abdominal lymph nodes likely reactive. U/A positive for hematuria, patient is postmenopausal.  Suspect likely renal colic with possible passage of kidney stones. Also considered UTI/pyelonephritis but u/a neg nitrites/leukocytes and pt denies any dysuria, frequency or urgency. Considered C.diff due to recent usage of antibiotics and recent watery diarrhea.  Lastly, consider diverticulitis given LLQ abdominal pain with bowel changes, however less likely with no evidence on imaging and pain pattern more consistent with above. - Abdominal pain resolved  - Last BM approximately 3 days  Ago - Miralax Q 12  - Transition diet to regular as tolerated  -Tylenol 674m q6h prn  -Can consider adding on increased pain regimen if abdominal pain returns  Bi-cytopenia: Chronic, stable.  WBC 1.9, platelets 111, around baseline. Has been following with hematology/oncology outpatient. - Monitor CBC  T2DM: Chronic, stable.  Last A1c 6.2 in 07/2018.  Glucose 101 on admit.  Takes metformin at home for control. 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  Recently had a gastric variceal bleed in 09/2018, had an ablation performed at [facility]. Hepatic cirrhosis and splenomegaly noted on CT abdomen with large perigastric varices stable with posttreatment changes. Follows with [facility], will be following up in August for an endoscopic ultrasound for surveillance of her ablation.  AST 59, appears to be around recent baseline, ALT WNL.  Total bilirubin 3.4, which is elevated from previous around 1.5.  We will monitor this.  Suspect thrombocytopenia secondary to hepatic cirrhosis.  -Continue home ursodiol 600 mg TID -CMP -Ensure follow-up with GI outpatient  GERD-chronic Asymptomatic -Continue home med Protonix 40 mg daily  Seasonal Allergies-chronic Asymptomatic -continue home med Loratadine 10 mg daily  FEN/GI: Clear fluids Prophylaxis: Lovenox  Disposition: Admit to MedSurg for observation, suspect DC today or tomorrow   Subjective:  Patient reports that she is doing well today. Still reports mild SOB while ambulating. Reports the abdominal pain from admission has resolved but she now feels like she needs to have a BM. Says that her last BM was either Wednesday night or Thursday morning. She is also asking for a diet other than liquid and would like to shower.   Objective: Temp:  [97.8 F (36.6 C)-98.2 F (36.8 C)] 97.8 F (36.6 C) (08/01 0344) Pulse Rate:  [72-108] 72 (08/01 0344) Resp:  [16-30] 16 (08/01 0344) BP: (110-142)/(59-88) 127/75 (08/01 0344) SpO2:  [90 %-97 %] 94 % (08/01 0344) Weight:  [102.1 kg-102.2 kg] 102.2 kg (08/01 0344) Lifestyle  Physical activity  . Days per week: 3 days  . Minutes per session: 40 min  Physical Exam  Constitutional: She is well-developed, well-nourished, and in no distress.  HENT:  Head: Normocephalic and atraumatic.   Eyes: EOM are normal.  Neck: Normal range of motion. Neck supple.  Cardiovascular: Normal rate, regular rhythm, normal heart sounds and intact distal pulses.  Pulmonary/Chest: Effort normal and breath sounds normal.  Abdominal: Soft. Bowel sounds are normal.  Neurological: She is alert.  Skin: Skin is warm and dry.  Psychiatric: Affect normal.     Laboratory: Recent Labs  Lab 12/08/18 1544 12/09/18 0833  WBC 1.9* 1.3*  HGB 12.6 11.6*  HCT 38.5 36.0  PLT 111* 91*   Recent Labs  Lab 12/08/18 1544 12/09/18 0833  NA 137  --   K 3.7  --   CL 108  --   CO2 20*  --   BUN <5*  --   CREATININE 0.60 0.78  CALCIUM 8.7*  --   PROT 7.9  --   BILITOT 3.4*  --   ALKPHOS 123  --   ALT 29  --   AST 59*  --   GLUCOSE 101*  --    Results for LYNASIA, MELOCHE" (MRN [id number]) as of 12/10/2018 06:53  Ref. Range 12/09/2018 12:30  Hemoglobin A1C Latest Ref Range: 4.8 - 5.6 % 4.8    Imaging/Diagnostic Tests: EXAM: CT CHEST WITHOUT CONTRAST  COMPARISON:  09/22/2018, 08/28/2018, 04/05/2018  IMPRESSION: 1. Examination for interstitial lung disease is very limited by low volume, expiratory technique and breath motion artifact throughout. Within this limitation, there is probable bibasilar partial atelectasis and mild bibasilar fibrotic changes of the lateral lung bases are not well elucidated. There is no definite acutely superimposed airspace opacity.  2. Request for technical repeat of this examination  was submitted to CT technologists at 12:44 p.m., technologist determination reported back at 3:45 p.m. was that images submitted are the best possible. Technical repeat examination could be attempted at a later time if acute shortness of breath is improved.  3.  Cardiomegaly.   Electronically Signed   By: Eddie Candle M.D.   On: 12/09/2018 16:11  EXAM: CT ABDOMEN AND PELVIS WITH CONTRAST  CONTRAST:  152m OMNIPAQUE IOHEXOL 300 MG/ML  SOLN  COMPARISON:   09/23/2018  IMPRESSION: Bilateral nonobstructing renal calculi measuring up to 5 mm. No hydronephrosis.  Cirrhosis. Splenomegaly. Large perigastric varices with post treatment changes related to prior gastric varix embolization. Portal vein is patent. No abdominopelvic ascites.  Electronically Signed   By: SJulian HyM.D.   On: 12/09/2018 03:04   CGifford Shave MD 12/10/2018, 6:52 AM PGY-1, CWoodburyIntern pager: [phone] text pages welcome

## 2018-12-10 NOTE — TOC Initial Note (Signed)
Transition of Care [facility]) - Initial/Assessment Note    Patient Details  Name: Holly Mueller MRN: [id number] Date of Birth: 1966-04-17  Transition [facility] Care [facility]) CM/SW Contact:    Gelene Mink, [place] Phone Number: 12/10/2018, 3:21 PM  Clinical Narrative:                  CSW received a phone call from the MD concerned about the patient's home conditions. CSW met with the patient at bedside. PT was finishing up with the patient. CSW spoke with the patient about her home conditions. She stated that she lives at home with her son and her roommate. She stated that she has a mold and bat problem. She stated that there is mold in her bed and under her roommates bed. CSW asked if she had reported the conditions to her landlord, the patient stated that she had but they haven't been addressed. The patient reported that she has two brothers and a sister. She stated that she could possibly stay with her sister. She stated that her both her brothers are not able to have additional people living in the homes. She stated that she is also concerned about her son because he has serious mental health issues. She stated that he is getting out of [facility].   CSW asked if the patient had any type of insurance. She stated that she has family medicaid. She hasn't had any luck finding a new place. She has been trying to apply for disability. She is working with an Forensic psychologist. She is not eligible for medicaid at this time, she reported. The patient reported that she works at a [employer] in [place]. She stated that she has been receiving unemployment but in August it will go down to $134.00. She reported that she has received around $5,000.00 in unemployment. CSW asked if she could move to another place, she stated that she is concerned that she will not be to keep the bills up once she moves. She stated that she hasn't been working nearly as much as she was. She is lucky to work 1-3 days  per week. CSW asked if she could stay with her sister, the patient stated that she could possibly do it. CSW asked if she had transportation, she stated that she did not have a car and could not ride the bus due to having a health condition.   The patient contacted her sister while the CSW was in the room. The patient gave the CSW permission to speak with her. CSW discussed if the patient could come stay with her until she got back on her feet. The patient's sister agreed that she could but she would have to double check with her landlord. The patient's son could not come stay with her. The patient would have to find an alternate plan for her son.   CSW stated she would bring the patient some housing resources for [place]. PT will continue to follow the patient and evaluate on Monday.   Expected Discharge Plan: Home/Self Care Barriers to Discharge: Continued Medical Work up   Patient Goals and CMS Choice        Expected Discharge Plan and Services Expected Discharge Plan: Home/Self Care In-house Referral: Clinical Social Work   Post Acute Care Choice: NA Living arrangements for the past 2 months: Single Family Home                 DME Arranged: N/A DME Agency:  NA       HH Arranged: NA HH Agency: NA        Prior Living Arrangements/Services Living arrangements for the past 2 months: [place] with:: Adult Children, Roommate Patient language and need for interpreter reviewed:: No Do you feel safe going back to the place where you live?: No   Pt reports that she has a mold and rat problem. She also does not have working utilities at this moment at time.  Need for Family Participation in Patient Care: No (Comment) Care giver support system in place?: Yes (comment)   Criminal Activity/Legal Involvement Pertinent to Current Situation/Hospitalization: No - Comment as needed  Activities of Daily Living Home Assistive Devices/Equipment: Eyeglasses ADL Screening  (condition at time of admission) Patient's cognitive ability adequate to safely complete daily activities?: Yes Is the patient deaf or have difficulty hearing?: No Does the patient have difficulty seeing, even when wearing glasses/contacts?: No Does the patient have difficulty concentrating, remembering, or making decisions?: No Patient able to express need for assistance with ADLs?: Yes Does the patient have difficulty dressing or bathing?: No Independently performs ADLs?: Yes (appropriate for developmental age) Does the patient have difficulty walking or climbing stairs?: Yes Weakness of Legs: Both Weakness of Arms/Hands: None  Permission Sought/Granted Permission sought to share information with : Case Manager Permission granted to share information with : Yes, Verbal Permission Granted        Permission granted to share info w Relationship: Sister     Emotional Assessment Appearance:: Appears stated age Attitude/Demeanor/Rapport: Crying Affect (typically observed): Sad Orientation: : Oriented to Self, Oriented to Place, Oriented to  Time, Oriented to Situation Alcohol / Substance Use: Not Applicable Psych Involvement: No (comment)  Admission diagnosis:  Shortness of breath [R06.02] Cough [R05] Lower abdominal pain [R10.30] Patient Active Problem List   Diagnosis Date Noted  . Acute respiratory failure with hypoxia ([place]) 12/09/2018  . Lower abdominal pain   . Inadequate housing 12/05/2018  . Community acquired pneumonia 11/28/2018  . Primary biliary cholangitis ([place]) 10/24/2018  . Gastric varices with bleeding   . Anemia 09/22/2018  . Pancytopenia ([place]) 08/28/2018  . Does not have health insurance 04/04/2018  . ILD (interstitial lung disease) ([place]) 03/23/2018  . Type 2 diabetes mellitus without complication, without long-term current use of insulin ([place])   . 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 ([place]) 11/17/2016  . Bicytopenia 10/24/2016  . GERD  (gastroesophageal reflux disease) 10/23/2016  . Cough 12/09/2015  . Precordial chest pain 08/03/2015  . DOE (dyspnea on exertion) 08/17/2013  . Ganglion cyst of wrist 08/24/2012  . Right hip pain 08/23/2012  . Complex care coordination 02/14/2012  . Obesity 07/08/2010  . Shortness of breath 12/12/2009  . Depression 07/26/2008  . Hypothyroidism 10/12/2006  . Restrictive lung disease 07/30/2006   PCP:  Cleophas Dunker, DO Pharmacy:   [facility] [address] [place] [address] Phone: [phone] Fax: [place], [place] - [address] [facility]. [address] [place] [address] Phone: [phone] Fax: [place], [place] - [address] [place] [address] Phone: [phone] Fax: [phone]  [facility] [place], [place] - [address] [place] [address] Phone: [phone] Fax: [phone]     Social Determinants  of Health (SDOH) Interventions    Readmission Risk Interventions Readmission Risk Prevention Plan 11/24/2018  Transportation Screening Complete  PCP or Specialist Appt within 3-5 Days Complete  HRI or [place] Complete  Social Work Consult for [place] Planning/Counseling Complete  Palliative Care Screening Not Applicable  Medication Review Press photographer) Complete  Some recent data might be hidden

## 2018-12-10 NOTE — Progress Notes (Signed)
New Admission Note:  Arrival Method: Stretcher Mental Orientation: Alert and oriented x 4 Telemetry: N/A Assessment: Completed Skin: Warm and dry IV: NSL Pain: Denies Tubes: N/A Safety Measures: Safety Fall Prevention Plan initiated.  Admission: Completed 5 M  Orientation: Patient has been orientated to the room, unit and the staff. Welcome booklet given.  Family: N/A  Orders have been reviewed and implemented. Will continue to monitor the patient. Call light has been placed within reach and bed alarm has been activated.   Sima Matas BSN, RN  Phone Number: [phone]

## 2018-12-10 NOTE — Progress Notes (Signed)
Initial Nutrition Assessment  DOCUMENTATION CODES:   Obesity unspecified  INTERVENTION:  Ensure Enlive po BID, each supplement provides 350 kcal and 20 grams of protein MVI   NUTRITION DIAGNOSIS:   Increased nutrient needs related to chronic illness(increased SOB secondary to ILD) as evidenced by estimated needs.   GOAL:   Patient will meet greater than or equal to 90% of their needs   MONITOR:   PO intake, Labs, Weight trends, Supplement acceptance  REASON FOR ASSESSMENT:   Malnutrition Screening Tool    ASSESSMENT:  RD working remotely.   53 yo female with medical history significant of ILD, gastric varices, primary biliary cholangitis w/ hepatic cirrhosis, GERD, hypothyroidism, T2DM. Pt presented to ED with abdominal pain and SOB over the past month. Recent [facility] admit 7/12-7/16 with CAP  09/2018 - ablation performed at [facility] for gastric variceal bleed  Unable to reach pt via phone today. Pt reported chronic cough with some clear/loose sputum s/p completion of antibiotics prescribed by PCP. She uses inhaler 2x/day and recently has increased to every 4hrs with some relief. Patient reported her current housing situation has mold and bats on the outside above her porch and is exposed to second-hand smoke from her roommate.  Per chart review, pt reports she is doing better today and mild SOB while ambulating. Abdominal pain has resolved, but feels she needs to have a BM; Miralax q 12hrs ordered. Patient inpatient for observation with suspected DC later today or tomorrow  Patient is followed by pulmonology clinic for ILD with upcoming appointment on 8/4  Eating 100% of meals since diet advanced to regular RD to order Ensure BID to assist with increased calorie and protein needs  Current wt: 102.2 kg (224.8 lbs) Weights trending down over the past 3 months -17.6 lbs (7.3%) insignificant for time frame  Medications reviewed and include: protonix, miralax,  ursodiol Labs: no  current labs for review at this time   NUTRITION - FOCUSED PHYSICAL EXAM: Unable to complete at this time   Diet Order:   Diet Order            Diet regular Room service appropriate? Yes; Fluid consistency: Thin  Diet effective now              EDUCATION NEEDS:   No education needs have been identified at this time  Skin:  Skin Assessment: Reviewed RN Assessment  Last BM:  7/29  Height:   Ht Readings from Last 1 Encounters:  12/09/18 _0  (1.676 m)    Weight:   Wt Readings from Last 1 Encounters:  12/10/18 102.2 kg    Ideal Body Weight:  59.1 kg  BMI:  Body mass index is 36.36 kg/m.  Estimated Nutritional Needs:   Kcal:  2100-2300  Protein:  95-115  Fluid:  >2.1L  Lajuan Lines, RD, LDN  After Hours/Weekend Pager: [phone]

## 2018-12-10 NOTE — Plan of Care (Signed)
  Problem: Education: Goal: Knowledge of General Education information will improve Description: Including pain rating scale, medication(s)/side effects and non-pharmacologic comfort measures Outcome: Progressing   Problem: Clinical Measurements: Goal: Diagnostic test results will improve Outcome: Progressing   Problem: Elimination: Goal: Will not experience complications related to bowel motility Outcome: Progressing

## 2018-12-10 NOTE — Progress Notes (Signed)
Spoke with social work and they will go to see patient to discuss options.

## 2018-12-11 DIAGNOSIS — J984 Other disorders of lung: Secondary | ICD-10-CM

## 2018-12-11 LAB — CBC
HCT: 31.7 % — ABNORMAL LOW (ref 36.0–46.0)
Hemoglobin: 10.3 g/dL — ABNORMAL LOW (ref 12.0–15.0)
MCH: 28.6 pg (ref 26.0–34.0)
MCHC: 32.5 g/dL (ref 30.0–36.0)
MCV: 88.1 fL (ref 80.0–100.0)
Platelets: 80 10*3/uL — ABNORMAL LOW (ref 150–400)
RBC: 3.6 MIL/uL — ABNORMAL LOW (ref 3.87–5.11)
RDW: 15.9 % — ABNORMAL HIGH (ref 11.5–15.5)
WBC: 1.3 10*3/uL — CL (ref 4.0–10.5)
nRBC: 0 % (ref 0.0–0.2)

## 2018-12-11 LAB — BASIC METABOLIC PANEL
Anion gap: 6 (ref 5–15)
BUN: 7 mg/dL (ref 6–20)
CO2: 25 mmol/L (ref 22–32)
Calcium: 8.6 mg/dL — ABNORMAL LOW (ref 8.9–10.3)
Chloride: 110 mmol/L (ref 98–111)
Creatinine, Ser: 0.74 mg/dL (ref 0.44–1.00)
GFR calc Af Amer: >60 mL/min (ref >60)
GFR calc non Af Amer: >60 mL/min (ref >60)
Glucose, Bld: 149 mg/dL — ABNORMAL HIGH (ref 70–99)
Potassium: 3.6 mmol/L (ref 3.5–5.1)
Sodium: 141 mmol/L (ref 135–145)

## 2018-12-11 MED ORDER — SPIRIVA HANDIHALER 18 MCG IN CAPS: 18.0000 ug | ORAL_CAPSULE | Freq: Every day | RESPIRATORY_TRACT | 0 refills | Status: DC

## 2018-12-11 MED ORDER — SPIRIVA HANDIHALER 18 MCG IN CAPS: 18.0000 ug | ORAL_CAPSULE | Freq: Every day | RESPIRATORY_TRACT | 2 refills | Status: DC

## 2018-12-11 MED ORDER — BUDESONIDE-FORMOTEROL FUMARATE 80-4.5 MCG/ACT IN AERO: 2.0000 | INHALATION_SPRAY | Freq: Two times a day (BID) | RESPIRATORY_TRACT | 12 refills | Status: DC

## 2018-12-11 MED ORDER — BUDESONIDE-FORMOTEROL FUMARATE 80-4.5 MCG/ACT IN AERO: 2.0000 | INHALATION_SPRAY | Freq: Two times a day (BID) | RESPIRATORY_TRACT | 0 refills | Status: DC

## 2018-12-11 NOTE — Plan of Care (Signed)
  Problem: Education: Goal: Knowledge of General Education information will improve Description: Including pain rating scale, medication(s)/side effects and non-pharmacologic comfort measures 12/11/2018 1414 by Dolores Hoose, RN Outcome: Adequate for Discharge 12/11/2018 1624 by Dolores Hoose, RN Outcome: Progressing   Problem: Clinical Measurements: Goal: Ability to maintain clinical measurements within normal limits will improve Outcome: Adequate for Discharge Goal: Will remain free from infection Outcome: Adequate for Discharge Goal: Diagnostic test results will improve Outcome: Adequate for Discharge Goal: Respiratory complications will improve 12/11/2018 1414 by Dolores Hoose, RN Outcome: Adequate for Discharge 12/11/2018 4695 by Dolores Hoose, RN Outcome: Progressing Goal: Cardiovascular complication will be avoided Outcome: Adequate for Discharge

## 2018-12-11 NOTE — Discharge Instructions (Signed)
Thank you for allowing [place] to participate in your care!    You were admitted for shortness of breath.  Please stop taking her Asmanex.  While in the hospital we were able to give you Dulera and Incruse.  You will need to continue taking the [facility] 2 puffs twice daily and then continue the Incruse 1 puff daily.  Once you have run out of this you will need to start taking Spiriva 2 puffs daily and Symbicort 2 puffs twice daily.  These are the recommended changes made by your pulmonologist.  It will also be very important that you follow-up with your pulmonologist as soon as possible.  We have sent you home with as needed oxygen.  If you experience worsening of your admission symptoms, develop shortness of breath, life threatening emergency, suicidal or homicidal thoughts you must seek medical attention immediately by calling 911 or calling your MD immediately  if symptoms less severe.

## 2018-12-11 NOTE — Plan of Care (Signed)
  Problem: Education: Goal: Knowledge of General Education information will improve Description: Including pain rating scale, medication(s)/side effects and non-pharmacologic comfort measures Outcome: Progressing   Problem: Clinical Measurements: Goal: Respiratory complications will improve Outcome: Progressing   Problem: Activity: Goal: Risk for activity intolerance will decrease Outcome: Progressing

## 2018-12-11 NOTE — TOC Transition Note (Addendum)
Transition of Care [facility]) - CM/SW Discharge Note   Patient Details  Name: Holly Mueller MRN: [id number] Date of Birth: 21-Jun-1965  Transition [facility]) CM/SW Contact:  Carles Collet, RN Phone Number: 12/11/2018, 12:44 PM   Clinical Narrative:    Patient to return home to [place] [address] She will be staying with her sister whose number is [phone].  She asks that we use that number after DC if needed. Referral placed to [facility] who is assigned to charity through Tuesday 8/4. Spoke with Erinn who states they are not accepting any more charity cases this week. Notified Dr Martinique that [facility] could not be arranged. Instructed patient to follow up with PCP early this week. Notified weekend supervisor Jolene Provost RN CM and [facility] Director  Suanne Marker Rhumple RN CM.  Referral made to [place] for charity oxygen   Reinstated MATCH, requested MD to send Rx to [facility] per pt request.   PCP [facility], uses [facility] pharmacy M-F  Update- [facility] able to take patient for a few initial visits.     Final next level of care: Home/Self Care Barriers to Discharge: No Barriers Identified   Patient Goals and CMS Choice Patient states their goals for this hospitalization and ongoing recovery are:: to return home      Discharge Placement                       Discharge Plan and Services In-house Referral: Clinical Social Work   Post Acute Care Choice: NA          DME Arranged: Oxygen DME Agency: [employer] Date DME Agency Contacted: 12/11/18 Time DME Agency Contacted: 29 Representative spoke with at DME Agency: Jeneen Rinks HH Arranged: NA [place] Agency: NA        Social Determinants of Health ([place]) Interventions     Readmission Risk Interventions Readmission Risk Prevention Plan 11/24/2018  Transportation Screening Complete  PCP or Specialist Appt within 3-5 Days Complete  HRI or [place] Complete  Social  Work Consult for [place] Planning/Counseling Complete  Palliative Care Screening Not Applicable  Medication Review Press photographer) Complete  Some recent data might be hidden

## 2018-12-11 NOTE — Discharge Summary (Signed)
[facility] Discharge Summary  Patient name: Holly Mueller Medical record number: [id number] Date of birth: July 01, 1965 Age: 53 y.o. Gender: female Date of Admission: 12/08/2018  Date of Discharge: 12/11/18  Admitting Physician: Baird Kay, MD  Primary Care Provider: Cleophas Dunker, DO Consultants: CSW  Indication for Hospitalization: shortness of breath  Discharge Diagnoses/Problem List:  Respiratory failure with hypoxia suspected ILD Acute abdominal pain, resolved Bicytopenia T2DM Hypothyroidism Cirrhosis 2/2 PBC GERD Seasonal allergies  Disposition: home   Discharge Condition: stable  Discharge Exam:  General: NAD, pleasant, breathing comfortably on RA ENTM: Moist mucous membranes Neck: Supple, no LAD Cardiovascular: RRR, no m/r/g, no LE edema Respiratory: CTA BL, no wheezing, crackles noted MSK: moves 4 extremities equally Derm: no rashes appreciated Neuro: CN II-XII grossly intact Psych: AOx3, appropriate affect  Brief Hospital Course:  Holly Mueller Sumneris a 53 y.o.femalepresenting with shortness of breath. PMH is significant for ILD, Gastric Varices, Primary Billary Cholangitis with Hepatic Cirrhosis, GERD, Hypothyroidism, DM type II.  Acute respiratory failure hypoxia, suspectedILD: Stable.  O2 93% on RA overnight.  Recently seen by her pulmonologist on 7/29, who recommended future further work-up including HRCT, pneumonitis labs, and full PFTs.  -Albuterol q4h prn, Dulera 2puffs BIDandUmeclidinium bromide 1puff daily(formula equivalents to inhaler regimenpulmonologist wanted patient to start on outpatient) -PT evaluated for O2 needs and patient dropped to 87% on room air; patient will require supplemental O2 at home -Dr. Chase Caller recommended supine/prone HRCT, spirometry with DLCO, and hypersensitivity pneumonitis panel - HCRT completedbut report indicates it needs to be redone when pulmonary  function is greater.  Acute Abdominal Pain- reportedsharpLLQ abdominal pain that radiated to lower back on admission which resolved shortly after.hx kidney stones. CT Abdomen showed bilateral non obstructing renal calculi measuring 5 mm, no hydronephrosiswithout evidence of diverticulitis. Small upper abdominal lymph nodes likely reactive. Suspect likely renal colic with possible passage of kidney stones.   While admitted patient was seen by social work given that patient is self-pay.  She reports that she currently lives in a home without electricity and with mold.  Patient to be discharged home with her sister so that she may have a safe place to stay.  She was also given housing resources for [place].   Issues for Follow Up:  1. Ensure follow-up with pulmonology, patient sent home with home oxygen 2. Dr. Chase Caller recommended supine/prone HRCT, spirometry with DLCO, and hypersensitivity pneumonitis panel 3. HCRT completedon 7/31, but report indicates it needs to be redone when pulmonary function is greater. 4. Ensure patient is receiving home health  Significant Procedures: n/a  Significant Labs and Imaging:  Recent Labs  Lab 12/08/18 1544 12/09/18 0833 12/11/18 0401  WBC 1.9* 1.3* 1.3*  HGB 12.6 11.6* 10.3*  HCT 38.5 36.0 31.7*  PLT 111* 91* 80*   Recent Labs  Lab 12/08/18 1544 12/09/18 0833 12/11/18 0401  NA 137  --  141  K 3.7  --  3.6  CL 108  --  110  CO2 20*  --  25  GLUCOSE 101*  --  149*  BUN <5*  --  7  CREATININE 0.60 0.78 0.74  CALCIUM 8.7*  --  8.6*  ALKPHOS 123  --   --   AST 59*  --   --   ALT 29  --   --   ALBUMIN 2.8*  --   --    A1c 4.8   Results/Tests Pending at Time of Discharge: n/a  Discharge Medications:  Allergies as of 12/11/2018      Reactions   Aspirin Nausea Only   Upset stomach      Medication List    STOP taking these medications   Mometasone Furoate 200 MCG/ACT Aero Commonly known as: Asmanex HFA     TAKE these  medications   acetaminophen 500 MG tablet Commonly known as: TYLENOL Take 1,000 mg by mouth every 6 (six) hours as needed for mild pain.   albuterol 108 (90 Base) MCG/ACT inhaler Commonly known as: ProAir HFA INHALE 2 PUFFS INTO THE LUNGS EVERY 6 HOURS AS NEEDED FOR SHORTNESS OF BREATH What changed:   how much to take  how to take this  when to take this  reasons to take this  additional instructions   albuterol (2.5 MG/3ML) 0.083% nebulizer solution Commonly known as: PROVENTIL Take 3 mLs (2.5 mg total) by nebulization every 6 (six) hours as needed for wheezing or shortness of breath. What changed: Another medication with the same name was changed. Make sure you understand how and when to take each.   benzonatate 100 MG capsule Commonly known as: TESSALON Take 1 capsule (100 mg total) by mouth at bedtime.   blood glucose meter kit and supplies Dispense based on patient and insurance preference. Use up to four times daily as directed. (FOR ICD-10 E10.9, E11.9).   blood glucose meter kit and supplies Kit Dispense based on patient and insurance preference. Use up to four times daily as directed. (FOR ICD-9 250.00, 250.01).   budesonide-formoterol 80-4.5 MCG/ACT inhaler Commonly known as: Symbicort Inhale 2 puffs into the lungs 2 (two) times daily.   cetirizine 10 MG tablet Commonly known as: ZYRTEC Take 1 tablet (10 mg total) by mouth daily.   fluticasone 50 MCG/ACT nasal spray Commonly known as: FLONASE Place 2 sprays into both nostrils daily.   glucose blood test strip Commonly known as: True Metrix Blood Glucose Test Use as instructed   levothyroxine 125 MCG tablet Commonly known as: SYNTHROID Take 2 tablets (250 mcg total) by mouth daily.   metFORMIN 500 MG tablet Commonly known as: GLUCOPHAGE Take 1 tablet (500 mg total) by mouth 2 (two) times daily with a meal.   pantoprazole 40 MG tablet Commonly known as: PROTONIX Take 1 tablet (40 mg total) by mouth  2 (two) times daily before a meal.   Spiriva HandiHaler 18 MCG inhalation capsule Generic drug: tiotropium Place 1 capsule (18 mcg total) into inhaler and inhale daily.   TRUEplus Lancets 28G Misc Use to check blood sugar as directed.   ursodiol 500 MG tablet Commonly known as: ACTIGALL Take 1 tablet (500 mg total) by mouth 3 (three) times daily.            Durable Medical Equipment  (From admission, onward)         Start     Ordered   12/11/18 1136  DME Oxygen  Once    Question Answer Comment  Length of Need 12 Months   Mode or (Route) Nasal cannula   Liters per Minute 2   Frequency Only at night (stationary unit needed)   Oxygen delivery system Gas      12/11/18 1136   12/11/18 1124  For home use only DME oxygen  Once    Question Answer Comment  Length of Need 12 Months   Mode or (Route) Nasal cannula   Liters per Minute 2   Oxygen delivery system Gas      12/11/18 1124  Discharge Instructions: Please refer to Patient Instructions section of EMR for full details.  Patient was counseled important signs and symptoms that should prompt return to medical care, changes in medications, dietary instructions, activity restrictions, and follow up appointments.   Follow-Up Appointments:   Dillin Lofgren, [place], DO 12/11/2018, 11:44 AM PGY-3, [place]

## 2018-12-11 NOTE — Progress Notes (Signed)
DISCHARGE NOTE SN[facility] Jill[facility] Burkes to be discharged Home per MD order. Patient verbalized understanding.  Skin clean, dry and intact without evidence of skin break down, no evidence of skin tears noted. IV catheter discontinued intact. Site without signs and symptoms of complications. Dressing and pressure applied. Pt denies pain at the site currently. No complaints noted.  Patient free of lines, drains, and wounds.   Discharge packet assembled. An After Visit Summary (AVS) was printed and given to the patient at bedside. Patient escorted via wheelchair and discharged to designated family member. All questions and concerns addressed.   Dolores Hoose, RN

## 2018-12-12 NOTE — Telephone Encounter (Signed)
Patient's COVID test was done on 7/31 in hospital and was negative.

## 2018-12-12 NOTE — Telephone Encounter (Signed)
Called and left message on pt vm to call back to schedule PFT since covid testing is already done - pr

## 2018-12-13 ENCOUNTER — Ambulatory Visit: Payer: Medicaid Other | Admitting: Pulmonary Disease

## 2018-12-13 NOTE — Telephone Encounter (Signed)
Called and left message on pt vm to call back to schedule PFT -pr

## 2018-12-14 LAB — HYPERSENSITIVITY PNEUMONITIS
A. Pullulans Abs: NEGATIVE
A.Fumigatus #1 Abs: NEGATIVE
Micropolyspora faeni, IgG: NEGATIVE
Pigeon Serum Abs: NEGATIVE
Thermoact. Saccharii: NEGATIVE
Thermoactinomyces vulgaris, IgG: NEGATIVE

## 2018-12-14 NOTE — Telephone Encounter (Signed)
Called and left message on pt vm to call back to schedule PFT - 3rd attempt - closed encounter -pr

## 2018-12-15 ENCOUNTER — Telehealth: Payer: Self-pay | Admitting: *Deleted

## 2018-12-15 NOTE — Telephone Encounter (Signed)
Pharmacy called related to inactive DISH card.  EDCM researched [facility] system to find that pt ID number was entered icorrectly.  EDCM asked pt to recite number on card to find a number was omitted.  EDCM advised pt to add missing number on card and stayed on phone to insure card was active. Pt was able to purchase Rx.

## 2018-12-16 ENCOUNTER — Telehealth: Payer: Self-pay | Admitting: Internal Medicine

## 2018-12-16 NOTE — Telephone Encounter (Signed)
Patient has appointment on 12/29/2018 and unable to get a ride. Does this need to be an in person visit Sir? If so we'll figure out another day.

## 2018-12-19 ENCOUNTER — Telehealth: Payer: Self-pay | Admitting: Pulmonary Disease

## 2018-12-19 NOTE — Telephone Encounter (Signed)
Patient just cannot find a ride here on 12/29/2018 so will have to do [facility] visit with Dr Carlean Purl that day.

## 2018-12-19 NOTE — Telephone Encounter (Signed)
Called patient back and advised that we are waiting on Dr. Cordelia Pen response on scheduling appt. Message was sent via results. Nothing further needed.

## 2018-12-19 NOTE — Telephone Encounter (Signed)
Left her a detailed message and said for her to call me back.

## 2018-12-19 NOTE — Telephone Encounter (Signed)
We should try for in person but if not telehealth  Most importantly she has an 8/31 EUS with Dr. Jerilynn Mages and a Covid test before that that are more important I think

## 2018-12-21 ENCOUNTER — Telehealth: Payer: Self-pay | Admitting: Internal Medicine

## 2018-12-23 ENCOUNTER — Telehealth: Payer: Self-pay | Admitting: Licensed Clinical Social Worker

## 2018-12-23 MED FILL — PANTOPRAZOLE SOD DR 40 MG T: 40 | 30 days supply | Qty: 60 | Fill #1

## 2018-12-23 MED FILL — metFORMIN HCL 500 MG TABS: 500 | 30 days supply | Qty: 60 | Fill #0

## 2018-12-23 MED FILL — LEVOTHYROXINE 125 MCG TAB: 125 | 30 days supply | Qty: 60 | Fill #0

## 2018-12-23 MED FILL — BENZONATATE 100 MG CAPS: 100 | 30 days supply | Qty: 30 | Fill #0

## 2018-12-23 NOTE — Telephone Encounter (Addendum)
  Care Coordination  Clinical Social Work Follow Up Note 12/23/2018 Name: Illiana Losurdo MRN: [id number] DOB: 1966/04/24  Reason for referral : Care Coordination (F/U call) , Cristan Hout is a 53 y.o. year old female who is a primary care patient of Goldonna, Bernita Raisin, DO.    Reason for follow-up: phone encounter with patient today to assess for ongoing care coordination needs with housing.  Assessment: Patient has spoken to [facility] for housing resources however she has decided to temp live with her sister in [place]. States she still has the information and my call at a later date. Plan: No further follow up required: by LCSW   Meccariello, Bernita Raisin, DO has been notified of patient's recommendations and plan.  Casimer Lanius, LCSW Clinical Social Worker [facility] [place]   [phone] 1:49 PM

## 2018-12-29 ENCOUNTER — Other Ambulatory Visit (HOSPITAL_COMMUNITY): Payer: MEDICAID

## 2018-12-29 ENCOUNTER — Ambulatory Visit (INDEPENDENT_AMBULATORY_CARE_PROVIDER_SITE_OTHER): Payer: Self-pay | Admitting: Internal Medicine

## 2018-12-29 ENCOUNTER — Encounter: Payer: Self-pay | Admitting: Internal Medicine

## 2018-12-29 DIAGNOSIS — K743 Primary biliary cirrhosis: Secondary | ICD-10-CM

## 2018-12-29 DIAGNOSIS — I864 Gastric varices: Secondary | ICD-10-CM

## 2018-12-29 NOTE — Progress Notes (Signed)
This is a telehealth visit, phone call lasting 6 minutes 22 seconds, A/V was not possible though he tried it did not work out.  The patient was alone she understood this was a billable visit and no one else participated in the call   Holly Mueller 53 y.o. 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  I will have her get liver tests and an INR when Dr. Rush Landmark does her EUS.  Gastric varices with bleeding This was sclerosed and were obliterated with cyanoacrylate at [facility] earlier this year and she is due for an EUS follow-up August 31.    I appreciate the opportunity to care for this patient. CC: Meccariello, Bernita Raisin, DO   Subjective:   Chief Complaint: Follow-up of primary biliary cholangitis  HPI Holly Mueller reports that things are going relatively well and she is on ursodiol.  She has temporarily moved to [place] with her sister due to conditions in the house she was living in there may have been mold.  She is due for EUS follow-up of her previously obliterated gastric varix on August 31 with Dr. Rush Landmark and is set to do that has a ride has arrived to her COVID test.  She has some but says Mucinex. Allergies  Allergen Reactions  . Aspirin Nausea Only    Upset stomach   Current Meds  Medication Sig  . acetaminophen (TYLENOL) 500 MG tablet Take 1,000 mg by mouth every 6 (six) hours as needed for mild pain.  Marland Kitchen albuterol (PROAIR HFA) 108 (90 Base) MCG/ACT inhaler INHALE 2 PUFFS INTO THE LUNGS EVERY 6 HOURS AS NEEDED FOR SHORTNESS OF BREATH (Patient taking differently: Inhale 2 puffs into the lungs every 4 (four) hours as needed for wheezing or shortness of breath. )  . albuterol (PROVENTIL) (2.5 MG/3ML) 0.083% nebulizer solution Take 3 mLs (2.5 mg total) by nebulization every 6 (six) hours as needed for wheezing or shortness of breath.  . benzonatate (TESSALON) 100 MG capsule Take 1 capsule  (100 mg total) by mouth at bedtime.  . blood glucose meter kit and supplies KIT Dispense based on patient and insurance preference. Use up to four times daily as directed. (FOR ICD-9 250.00, 250.01).  . blood glucose meter kit and supplies Dispense based on patient and insurance preference. Use up to four times daily as directed. (FOR ICD-10 E10.9, E11.9).  . budesonide-formoterol (SYMBICORT) 80-4.5 MCG/ACT inhaler Inhale 2 puffs into the lungs 2 (two) times daily.  . cetirizine (ZYRTEC) 10 MG tablet Take 1 tablet (10 mg total) by mouth daily.  . fluticasone (FLONASE) 50 MCG/ACT nasal spray Place 2 sprays into both nostrils daily.  Marland Kitchen glucose blood (TRUE METRIX BLOOD GLUCOSE TEST) test strip Use as instructed  . levothyroxine (SYNTHROID) 125 MCG tablet Take 2 tablets (250 mcg total) by mouth daily.  . metFORMIN (GLUCOPHAGE) 500 MG tablet Take 1 tablet (500 mg total) by mouth 2 (two) times daily with a meal.  . pantoprazole (PROTONIX) 40 MG tablet Take 1 tablet (40 mg total) by mouth 2 (two) times daily before a meal.  . tiotropium (SPIRIVA HANDIHALER) 18 MCG inhalation capsule Place 1 capsule (18 mcg total) into inhaler and inhale daily.  . TRUEplus Lancets 28G MISC Use to check blood sugar as directed.  . ursodiol (ACTIGALL) 500 MG tablet Take 1 tablet (500 mg total) by mouth 3 (three) times daily.   Past Medical History:  Diagnosis Date  .  Acute respiratory failure with hypoxia ([place]) 09/22/2018  . Acute respiratory failure with hypoxia ([place]) 12/09/2018  . Arthritis    bursitis left hip flares-not an issue  . Chronic kidney disease   . Diabetes mellitus without complication ([place])   . Edentulous    10-19-13 at present  . Epilepsy ([place]) in 1972   No seizures since 1972. Previously treated with phenobarbital.   . Gastric varices with bleeding   . GERD (gastroesophageal reflux disease) 2008  . Headache(784.0)    hx of migraines   . 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 Hypothyroidism 2008  . Lower esophageal ring 08/18/2013  . Pneumonia   . Primary biliary cholangitis ([place]) 10/24/2018  . Seasonal allergies 2003  . SEIZURES, HX OF 12/12/2009   Annotation: last seizure in early 1970s Qualifier: Diagnosis of  By: Holly Sax  MD, Colletta [place]    . Shortness of breath   . Wears glasses    Past Surgical History:  Procedure Laterality Date  . ABDOMINAL HYSTERECTOMY     " partial "  . BALLOON DILATION N/A 10/24/2013   Procedure: BALLOON DILATION;  Surgeon: Holly Mayer, MD;  Location: [facility];  Service: Endoscopy;  Laterality: N/A;  . BIOPSY  09/26/2018   Procedure: BIOPSY;  Surgeon: Holly Park, MD;  Location: [facility];  Service: Gastroenterology;;  . CESAREAN SECTION     x2  . DENTAL SURGERY     multiple extractions 3'15  . DILATION AND CURETTAGE OF UTERUS    . ESOPHAGOGASTRODUODENOSCOPY N/A 10/24/2013   Procedure: ESOPHAGOGASTRODUODENOSCOPY (EGD);  Surgeon: Holly Mayer, MD;  Location: [facility];  Service: Endoscopy;  Laterality: N/A;  . ESOPHAGOGASTRODUODENOSCOPY (EGD) WITH PROPOFOL N/A 09/26/2018   Procedure: ESOPHAGOGASTRODUODENOSCOPY (EGD) WITH PROPOFOL;  Surgeon: Holly Park, MD;  Location: [facility];  Service: Gastroenterology;  Laterality: N/A;  . IR PARACENTESIS  09/23/2018  . LIVER BIOPSY  06/30/2012   Procedure: LIVER BIOPSY;  Surgeon: Holly Medal, MD;  Location: [facility];  Service: General;;  . NOVASURE ABLATION    . SHOULDER ARTHROSCOPY Right 2011  . UMBILICAL HERNIA REPAIR N/A 06/30/2012   Procedure: remove umbilicus;  Surgeon: Holly Medal, MD;  Location: [facility];  Service: General;  Laterality: N/A;  . VENTRAL HERNIA REPAIR N/A 06/30/2012   Procedure: LAPAROSCOPIC VENTRAL HERNIA;  Surgeon: Holly Medal, MD;  Location: [facility];  Service: General;  Laterality: N/A;  With Mesh  . VIDEO BRONCHOSCOPY Bilateral 07/20/2018   Procedure: VIDEO BRONCHOSCOPY WITHOUT FLUORO;  Surgeon: Holly Males, MD;  Location: [facility];   Service: Cardiopulmonary;  Laterality: Bilateral;  . 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   Review of Systems As above

## 2018-12-29 NOTE — Assessment & Plan Note (Signed)
She is on treatment.  I will have her get liver tests and an INR when Dr. Rush Landmark does her EUS.

## 2018-12-29 NOTE — Patient Instructions (Signed)
As we discussed, when you come to the hospital for your procedure bring all of the medicines you are taking in a bag so they can be sure you what you are on  I will let Dr. Rush Landmark know to check liver tests when you come there and once we see those we will determine next steps regarding the liver.

## 2018-12-29 NOTE — Assessment & Plan Note (Signed)
This was sclerosed and were obliterated with cyanoacrylate at [facility] earlier this year and she is due for an EUS follow-up August 31.

## 2019-01-02 ENCOUNTER — Telehealth: Payer: Self-pay

## 2019-01-02 ENCOUNTER — Telehealth: Payer: Self-pay | Admitting: Internal Medicine

## 2019-01-02 ENCOUNTER — Ambulatory Visit (INDEPENDENT_AMBULATORY_CARE_PROVIDER_SITE_OTHER): Payer: Self-pay | Admitting: Pulmonary Disease

## 2019-01-02 ENCOUNTER — Other Ambulatory Visit: Payer: Self-pay

## 2019-01-02 ENCOUNTER — Encounter: Payer: Self-pay | Admitting: Pulmonary Disease

## 2019-01-02 VITALS — BP 112/66 | HR 78 | Temp 98.0°F | Ht 66.0 in | Wt 229.6 lb

## 2019-01-02 DIAGNOSIS — J849 Interstitial pulmonary disease, unspecified: Secondary | ICD-10-CM

## 2019-01-02 NOTE — Telephone Encounter (Signed)
The order has been added to Epic to be completed on arrival to ENDO

## 2019-01-02 NOTE — Telephone Encounter (Signed)
Left her a detailed message that Thursday is her COVID screening and told her to call me back.

## 2019-01-02 NOTE — Telephone Encounter (Signed)
Order placed 12/09/18 for PFT by SG, just following up on the status of those, patient in office with [facility] for hospital follow up, needs to be done probably with or before follow up with MR

## 2019-01-02 NOTE — Telephone Encounter (Signed)
-----  Message from Irving Copas., MD sent at 12/30/2018  5:02 PM EDT ----- Regarding: RE: Labs at EUS [place], No problem. Shajuana Mclucas, can you put these orders into the system so that we can have them done on the day that she comes for our EUS? Thanks. GM ----- Message ----- From: Gatha Mayer, MD Sent: 12/29/2018   4:27 PM EDT To: Irving Copas., MD Subject: Labs at EUS                                    When she has her EUS can you order LFt and an INR to f/u her PBC?  Thanks  Glendell Docker

## 2019-01-02 NOTE — Patient Instructions (Addendum)
We will arrange for PFTs and follow-up with Dr. Chase Caller

## 2019-01-02 NOTE — Progress Notes (Signed)
Subjective:   PATIENT ID: Holly Mueller GENDER: female DOB: 05-28-1965, MRN: [id number]   HPI  Chief Complaint  Patient presents with  . Hospitalization Follow-up    Reason for Visit: Follow-up   [name] is a 53 year old female with interstitial lung disease, liver cirrhosis and diabetes who follows Dr. Chase Caller in ILD clinic who presents worsening shortness of breath/hospital follow-up  She was hospitalized from 7/30-8/2 for acute hypoxemic respiratory failure requiring oxygen. She had no evidence of infectious etiology. Since discharge, she continues to shortness of breath and cough. Her cough is unchanged and is triggered with exertion and excessive talking. She previously had productive cough but that has improved with Mucinex. She has stayed at home and limited her excursions to doctor appointments of grocery store visits. She is not very ambulatory at home. She has not been wearing her oxygen within the home but will wear it when she goes out.  She is on Symbicort and Spiriva but had some questions regarding how and when to take it. Her breathing is somewhat improved on bronchodilators. Denies fever, chills.  I have personally reviewed patient's past medical/family/social history, allergies, current medications.  Past Medical History:  Diagnosis Date  . Acute respiratory failure with hypoxia ([place]) 09/22/2018  . Acute respiratory failure with hypoxia ([place]) 12/09/2018  . Arthritis    bursitis left hip flares-not an issue  . Chronic kidney disease   . Community acquired pneumonia 11/28/2018  . Diabetes mellitus without complication ([place])   . Edentulous    10-19-13 at present  . Epilepsy ([place]) in 1972   No seizures since 1972. Previously treated with phenobarbital.   . Gastric varices with bleeding   . GERD (gastroesophageal reflux disease) 2008  . Headache(784.0)    hx of migraines   . 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  Hypothyroidism 2008  . Lower esophageal ring 08/18/2013  . Pneumonia   . Primary biliary cholangitis ([place]) 10/24/2018  . Seasonal allergies 2003  . SEIZURES, HX OF 12/12/2009   Annotation: last seizure in early 1970s Qualifier: Diagnosis of  By: Carlena Sax  MD, Colletta [place]    . Shortness of breath   . Wears glasses      Family History  Problem Relation Age of Onset  . Hypertension Mother   . Diabetes Mother   . Cirrhosis Mother        ? medications  . Lung cancer Father   . Stroke Father   . Diabetes Sister   . Hypertension Sister   . Colon cancer Neg Hx   . Esophageal cancer Neg Hx   . Rectal cancer Neg Hx      Social History   Occupational History  . Occupation: unemployed  Tobacco Use  . Smoking status: Former Smoker    Packs/day: 0.25    Types: Cigarettes    Quit date: 1997    Years since quitting: 23.6  . Smokeless tobacco: Never Used  . Tobacco comment: social-only tried never smoked a whole day 01/02/19  Substance and Sexual Activity  . Alcohol use: Not Currently  . Drug use: No  . Sexual activity: Not Currently    Allergies  Allergen Reactions  . Aspirin Nausea Only    Upset stomach     Outpatient Medications Prior to Visit  Medication Sig Dispense Refill  . acetaminophen (TYLENOL) 500 MG tablet Take 1,000 mg by mouth every 6 (six) hours as needed for mild  pain.    . albuterol (PROAIR HFA) 108 (90 Base) MCG/ACT inhaler INHALE 2 PUFFS INTO THE LUNGS EVERY 6 HOURS AS NEEDED FOR SHORTNESS OF BREATH (Patient taking differently: Inhale 2 puffs into the lungs every 4 (four) hours as needed for wheezing or shortness of breath. ) 8.5 g 5  . albuterol (PROVENTIL) (2.5 MG/3ML) 0.083% nebulizer solution Take 3 mLs (2.5 mg total) by nebulization every 6 (six) hours as needed for wheezing or shortness of breath. 75 mL 2  . benzonatate (TESSALON) 100 MG capsule Take 1 capsule (100 mg total) by mouth at bedtime. 30 capsule 0  . blood glucose meter kit and supplies KIT Dispense  based on patient and insurance preference. Use up to four times daily as directed. (FOR ICD-9 250.00, 250.01). 1 each 0  . blood glucose meter kit and supplies Dispense based on patient and insurance preference. Use up to four times daily as directed. (FOR ICD-10 E10.9, E11.9). 1 each 0  . budesonide-formoterol (SYMBICORT) 80-4.5 MCG/ACT inhaler Inhale 2 puffs into the lungs 2 (two) times daily. 1 Inhaler 0  . cetirizine (ZYRTEC) 10 MG tablet Take 1 tablet (10 mg total) by mouth daily. 30 tablet 11  . fluticasone (FLONASE) 50 MCG/ACT nasal spray Place 2 sprays into both nostrils daily.    Marland Kitchen glucose blood (TRUE METRIX BLOOD GLUCOSE TEST) test strip Use as instructed 100 each 12  . levothyroxine (SYNTHROID) 125 MCG tablet Take 2 tablets (250 mcg total) by mouth daily. 60 tablet 0  . metFORMIN (GLUCOPHAGE) 500 MG tablet Take 1 tablet (500 mg total) by mouth 2 (two) times daily with a meal. 180 tablet 2  . pantoprazole (PROTONIX) 40 MG tablet Take 1 tablet (40 mg total) by mouth 2 (two) times daily before a meal. 60 tablet 3  . tiotropium (SPIRIVA HANDIHALER) 18 MCG inhalation capsule Place 1 capsule (18 mcg total) into inhaler and inhale daily. 30 capsule 0  . TRUEplus Lancets 28G MISC Use to check blood sugar as directed. 100 each 1  . ursodiol (ACTIGALL) 500 MG tablet Take 1 tablet (500 mg total) by mouth 3 (three) times daily. 270 tablet 3   No facility-administered medications prior to visit.     Review of Systems  Constitutional: Negative for chills, diaphoresis, fever, malaise/fatigue and weight loss.  HENT: Negative for congestion, ear pain and sore throat.   Respiratory: Positive for cough and shortness of breath. Negative for hemoptysis, sputum production and wheezing.   Cardiovascular: Negative for chest pain, palpitations and leg swelling.  Gastrointestinal: Negative for abdominal pain, heartburn and nausea.  Genitourinary: Negative for frequency.  Musculoskeletal: Negative for joint  pain and myalgias.  Skin: Negative for itching and rash.  Neurological: Negative for dizziness, weakness and headaches.  Endo/Heme/Allergies: Does not bruise/bleed easily.  Psychiatric/Behavioral: Negative for depression. The patient is not nervous/anxious.      Objective:   Vitals:   01/02/19 1157 01/02/19 1159  BP:  112/66  Pulse:  78  Temp: 98 F (36.7 C)   TempSrc: Oral   SpO2:  92%  Weight: 229 lb 9.6 oz (104.1 kg)   Height: _0  (1.676 m)    SpO2: 92 % O2 Device: None (Room air)  Physical Exam: General: Well-appearing, no acute distress HENT: [place], AT Eyes: EOMI, no scleral icterus Respiratory: Diminished breath sounds bilaterally. No crackles, wheezing or rales Cardiovascular: RRR, -M/R/G, no JVD GI: BS+, soft, nontender Extremities:-Edema,-tenderness Neuro: AAO x4, CNII-XII grossly intact Skin: Intact, no rashes or bruising  Psych: Normal mood, normal affect  Data Reviewed:  Imaging: CTA 09/22/18 - Subpleural groundglass bilaterally  PFT: 07/12/18 FVC 1.87 (50%) FEV1 1.58 (53%) Ratio 84  DLCO 66% Interpretation: Reduced FEV1 and FVC and moderately reduced gas exchange suggestive of restrictive defect.   Labs:  BAL 07/2018 Mixed cellularity with   Imaging, labs and tests noted above have been reviewed independently by me.    Assessment & Plan:   Discussion: 53 year old female with ILD followed by Dr. Chase Caller, cirrhosis, chronic thrombocytopenia who presents as follow-up for cough and shortness of breath. Started on bronchodilators <1 month ago with improvement in symptoms. No recurrent fevers.  Continue bronchodiltaors Advised to wear oxygen with exertion and sleep to maintain O2 >88% We will arrange for PFTs and follow-up with Dr. Chase Caller  Health Maintenance Immunization History  Administered Date(s) Administered  . Pneumococcal Polysaccharide-23 08/26/2011, 09/12/2017  . Td 05/11/2006    No orders of the defined types were placed in this  encounter. No orders of the defined types were placed in this encounter.   Return Follow-up with Dr. Chase Caller after PFTs.  [place], MD [facility] 01/02/2019 3:05 PM  Office Number [phone]

## 2019-01-02 NOTE — Telephone Encounter (Signed)
Left her another message to call me.

## 2019-01-03 NOTE — Telephone Encounter (Signed)
Questions about Covid testing answered.

## 2019-01-05 ENCOUNTER — Other Ambulatory Visit (HOSPITAL_COMMUNITY)
Admission: RE | Admit: 2019-01-05 | Discharge: 2019-01-05 | Disposition: A | Discharge status: Home / Self Care | Payer: HRSA Program | Source: Ambulatory Visit | Attending: Gastroenterology | Admitting: Gastroenterology

## 2019-01-05 DIAGNOSIS — Z01812 Encounter for preprocedural laboratory examination: Secondary | ICD-10-CM | POA: Diagnosis present

## 2019-01-05 DIAGNOSIS — Z20828 Contact with and (suspected) exposure to other viral communicable diseases: Secondary | ICD-10-CM | POA: Diagnosis not present

## 2019-01-05 LAB — SARS CORONAVIRUS 2 (TAT 6-24 HRS): SARS Coronavirus 2: NEGATIVE

## 2019-01-05 NOTE — Telephone Encounter (Signed)
Called and left message on pt vm to call back to schedule pft - pt had covid testing 01/05/2019 - also needs f/u with MR -pr

## 2019-01-05 NOTE — Telephone Encounter (Signed)
Another encounter open to f/u with MR with PFT - closing this note -pr

## 2019-01-06 ENCOUNTER — Encounter (HOSPITAL_COMMUNITY): Payer: Self-pay | Admitting: *Deleted

## 2019-01-06 ENCOUNTER — Other Ambulatory Visit: Payer: Self-pay

## 2019-01-06 NOTE — Telephone Encounter (Signed)
Called and spoke to pt - advised we can see her 9/2 in order to not have to be retested for covid - pt will call back after she checks with her family regarding transportation -pr

## 2019-01-06 NOTE — Progress Notes (Signed)
Holly Mueller denies chest pain.  Patient does experience shortness of breath with little activity. Patient was tested for Covid 01/05/2019, it was negative.  Holly Mueller has been in quarantine since that time. Holly Mueller has ILD and wears oxygen at night and when she is leaving the home. Patient reports that she is short of breath with little activity, she is a patient of [employer]. Holly Mueller has type II diabetes, reports that CBGs run in the 170's. I instructed patient to not take Metformin on Monday am.  Check CBG upon awakening and every 2 hours until leaving to come to the hospital. If CBG is  less than 70; drink 1/2 cup of apple juice tubel.  Recheck CBG in 15 minutes and call pre op desk - [phone] for further instructions.

## 2019-01-06 NOTE — Anesthesia Preprocedure Evaluation (Addendum)
Anesthesia Evaluation  Patient identified by MRN, date of birth, ID band Patient awake    Reviewed: Allergy & Precautions, NPO status , Patient's Chart, lab work & pertinent test results  History of Anesthesia Complications Negative for: history of anesthetic complications  Airway Mallampati: II  TM Distance: >3 FB Neck ROM: Full    Dental  (+) Edentulous Upper, Edentulous Lower   Pulmonary shortness of breath and Long-Term Oxygen Therapy, former smoker,  Interstitial lung disease   Pulmonary exam normal        Cardiovascular negative cardio ROS Normal cardiovascular exam     Neuro/Psych negative neurological ROS  negative psych ROS   GI/Hepatic GERD  ,(+) Cirrhosis   Esophageal Varices and ascites    , Primary biliary cholangitis w/ cirrhosis   Endo/Other  diabetesHypothyroidism   Renal/GU Renal disease  negative genitourinary   Musculoskeletal negative musculoskeletal ROS (+)   Abdominal (+) + obese,   Peds  Hematology  (+) anemia , thrombocytopenia   Anesthesia Other Findings   Reproductive/Obstetrics                           Anesthesia Physical Anesthesia Plan  ASA: III  Anesthesia Plan: MAC   Post-op Pain Management:    Induction: Intravenous  PONV Risk Score and Plan: Propofol infusion, TIVA and Treatment may vary due to age or medical condition  Airway Management Planned: Natural Airway and Nasal Cannula  Additional Equipment: None  Intra-op Plan:   Post-operative Plan:   Informed Consent: I have reviewed the patients History and Physical, chart, labs and discussed the procedure including the risks, benefits and alternatives for the proposed anesthesia with the patient or authorized representative who has indicated his/her understanding and acceptance.       Plan Discussed with:   Anesthesia Plan Comments: (Follows with Dr. Chase Caller for interstitial lung  disease. hospitalized from 7/30-8/2 for acute hypoxemic respiratory failure requiring oxygen. She had no evidence of infectious etiology. Seen by pulm 01/02/19, per note advised to wear oxygen with exertion and sleep to maintain O2 >88%.  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   TTE 09/24/18:  1. The left ventricle has normal systolic function with an ejection fraction of 60-65%. The cavity size was normal. Left ventricular diastolic parameters were normal. No evidence of left ventricular regional wall motion abnormalities.  2. The right ventricle has normal systolic function. The cavity was mildly enlarged. There is no increase in right ventricular wall thickness.  3. There is mild to moderate mitral annular calcification present.  4. The aortic valve is tricuspid. Moderate thickening of the aortic valve. Mild calcification of the aortic valve. Aortic valve regurgitation was not assessed by color flow Doppler. Very mild stenosis of the aortic valve. )      Anesthesia Quick Evaluation

## 2019-01-09 ENCOUNTER — Encounter (HOSPITAL_COMMUNITY): Payer: Self-pay

## 2019-01-09 ENCOUNTER — Ambulatory Visit (HOSPITAL_COMMUNITY): Payer: Medicaid Other | Admitting: Physician Assistant

## 2019-01-09 ENCOUNTER — Ambulatory Visit (HOSPITAL_COMMUNITY): Admit: 2019-01-09 | Payer: MEDICAID | Admitting: Gastroenterology

## 2019-01-09 ENCOUNTER — Encounter (HOSPITAL_COMMUNITY)
Admission: RE | Disposition: A | Discharge status: Home / Self Care | Payer: Self-pay | Source: Home / Self Care | Attending: Gastroenterology

## 2019-01-09 ENCOUNTER — Other Ambulatory Visit: Payer: Self-pay | Admitting: Family Medicine

## 2019-01-09 ENCOUNTER — Ambulatory Visit (HOSPITAL_COMMUNITY)
Admission: RE | Admit: 2019-01-09 | Discharge: 2019-01-09 | Disposition: A | Discharge status: Home / Self Care | Payer: Medicaid Other | Attending: Gastroenterology | Admitting: Gastroenterology

## 2019-01-09 DIAGNOSIS — M1990 Unspecified osteoarthritis, unspecified site: Secondary | ICD-10-CM | POA: Diagnosis not present

## 2019-01-09 DIAGNOSIS — E039 Hypothyroidism, unspecified: Secondary | ICD-10-CM | POA: Diagnosis not present

## 2019-01-09 DIAGNOSIS — N189 Chronic kidney disease, unspecified: Secondary | ICD-10-CM | POA: Diagnosis not present

## 2019-01-09 DIAGNOSIS — K3189 Other diseases of stomach and duodenum: Secondary | ICD-10-CM | POA: Diagnosis not present

## 2019-01-09 DIAGNOSIS — I899 Noninfective disorder of lymphatic vessels and lymph nodes, unspecified: Secondary | ICD-10-CM | POA: Diagnosis not present

## 2019-01-09 DIAGNOSIS — I890 Lymphedema, not elsewhere classified: Secondary | ICD-10-CM | POA: Diagnosis not present

## 2019-01-09 DIAGNOSIS — K219 Gastro-esophageal reflux disease without esophagitis: Secondary | ICD-10-CM | POA: Diagnosis not present

## 2019-01-09 DIAGNOSIS — Z886 Allergy status to analgesic agent status: Secondary | ICD-10-CM | POA: Diagnosis not present

## 2019-01-09 DIAGNOSIS — Z9981 Dependence on supplemental oxygen: Secondary | ICD-10-CM | POA: Insufficient documentation

## 2019-01-09 DIAGNOSIS — Z87891 Personal history of nicotine dependence: Secondary | ICD-10-CM | POA: Insufficient documentation

## 2019-01-09 DIAGNOSIS — E1122 Type 2 diabetes mellitus with diabetic chronic kidney disease: Secondary | ICD-10-CM | POA: Insufficient documentation

## 2019-01-09 DIAGNOSIS — I864 Gastric varices: Secondary | ICD-10-CM

## 2019-01-09 DIAGNOSIS — I8500 Esophageal varices without bleeding: Secondary | ICD-10-CM | POA: Diagnosis not present

## 2019-01-09 DIAGNOSIS — K2950 Unspecified chronic gastritis without bleeding: Secondary | ICD-10-CM | POA: Insufficient documentation

## 2019-01-09 DIAGNOSIS — I8510 Secondary esophageal varices without bleeding: Secondary | ICD-10-CM

## 2019-01-09 DIAGNOSIS — K929 Disease of digestive system, unspecified: Secondary | ICD-10-CM | POA: Insufficient documentation

## 2019-01-09 HISTORY — DX: Interstitial pulmonary disease, unspecified: J84.9

## 2019-01-09 HISTORY — DX: Personal history of other medical treatment: Z92.89

## 2019-01-09 HISTORY — DX: Anxiety disorder, unspecified: F41.9

## 2019-01-09 HISTORY — PX: ESOPHAGOGASTRODUODENOSCOPY: SHX5428

## 2019-01-09 HISTORY — DX: Decreased white blood cell count, unspecified: D72.819

## 2019-01-09 HISTORY — PX: UPPER ESOPHAGEAL ENDOSCOPIC ULTRASOUND (EUS): SHX6562

## 2019-01-09 HISTORY — PX: BIOPSY: SHX5522

## 2019-01-09 LAB — CBC
HCT: 36 % (ref 36.0–46.0)
Hemoglobin: 11.7 g/dL — ABNORMAL LOW (ref 12.0–15.0)
MCH: 28.6 pg (ref 26.0–34.0)
MCHC: 32.5 g/dL (ref 30.0–36.0)
MCV: 88 fL (ref 80.0–100.0)
Platelets: 56 10*3/uL — ABNORMAL LOW (ref 150–400)
RBC: 4.09 MIL/uL (ref 3.87–5.11)
RDW: 15.4 % (ref 11.5–15.5)
WBC: 1 10*3/uL — CL (ref 4.0–10.5)
nRBC: 0 % (ref 0.0–0.2)

## 2019-01-09 LAB — HEPATIC FUNCTION PANEL
ALT: 27 U/L (ref 0–44)
AST: 55 U/L — ABNORMAL HIGH (ref 15–41)
Albumin: 2.7 g/dL — ABNORMAL LOW (ref 3.5–5.0)
Alkaline Phosphatase: 145 U/L — ABNORMAL HIGH (ref 38–126)
Bilirubin, Direct: 1.4 mg/dL — ABNORMAL HIGH (ref 0.0–0.2)
Indirect Bilirubin: 1.6 mg/dL — ABNORMAL HIGH (ref 0.3–0.9)
Total Bilirubin: 3 mg/dL — ABNORMAL HIGH (ref 0.3–1.2)
Total Protein: 7.1 g/dL (ref 6.5–8.1)

## 2019-01-09 LAB — GLUCOSE, CAPILLARY
Glucose-Capillary: 113 mg/dL — ABNORMAL HIGH (ref 70–99)
Glucose-Capillary: 103 mg/dL — ABNORMAL HIGH (ref 70–99)

## 2019-01-09 LAB — PROTIME-INR
INR: 1.3 — ABNORMAL HIGH (ref 0.8–1.2)
Prothrombin Time: 15.9 seconds — ABNORMAL HIGH (ref 11.4–15.2)

## 2019-01-09 SURGERY — UPPER ESOPHAGEAL ENDOSCOPIC ULTRASOUND (EUS)
Anesthesia: Monitor Anesthesia Care

## 2019-01-09 MED ORDER — SODIUM CHLORIDE 0.9 % IV SOLN: INTRAVENOUS | Status: DC

## 2019-01-09 MED ORDER — LIDOCAINE 2% (20 MG/ML) 5 ML SYRINGE
INTRAMUSCULAR | Status: DC | PRN
  Administered 2019-01-09: 50 mg via INTRAVENOUS

## 2019-01-09 MED ORDER — PHENYLEPHRINE HCL (PRESSORS) 10 MG/ML IV SOLN
INTRAVENOUS | Status: DC | PRN
  Administered 2019-01-09 (×3): 120 ug via INTRAVENOUS
  Administered 2019-01-09: 280 ug via INTRAVENOUS

## 2019-01-09 MED ORDER — PROPOFOL 10 MG/ML IV BOLUS
INTRAVENOUS | Status: DC | PRN
  Administered 2019-01-09: 20 mg via INTRAVENOUS
  Administered 2019-01-09: 40 mg via INTRAVENOUS
  Administered 2019-01-09: 20 mg via INTRAVENOUS

## 2019-01-09 MED ORDER — PROPOFOL 500 MG/50ML IV EMUL
INTRAVENOUS | Status: DC | PRN
  Administered 2019-01-09: 100 ug/kg/min via INTRAVENOUS

## 2019-01-09 MED ORDER — LACTATED RINGERS IV SOLN
INTRAVENOUS | Status: DC
  Administered 2019-01-09 (×2): via INTRAVENOUS

## 2019-01-09 NOTE — Anesthesia Procedure Notes (Signed)
Procedure Name: MAC Date/Time: 01/09/2019 12:00 PM Performed by: Leonor Liv, CRNA Pre-anesthesia Checklist: Patient identified, Emergency Drugs available, Suction available, Patient being monitored and Timeout performed Oxygen Delivery Method: Nasal cannula Airway Equipment and Method: Bite block Placement Confirmation: positive ETCO2 Dental Injury: Teeth and Oropharynx as per pre-operative assessment

## 2019-01-09 NOTE — Transfer of Care (Signed)
Immediate Anesthesia Transfer of Care Note  Patient: Holly Mueller  Procedure(s) Performed: UPPER ESOPHAGEAL ENDOSCOPIC ULTRASOUND (EUS) (N/A ) ESOPHAGOGASTRODUODENOSCOPY (EGD) (N/A ) BIOPSY  Patient Location: PACU  Anesthesia Type:MAC  Level of Consciousness: drowsy  Airway & Oxygen Therapy: Patient Spontanous Breathing and Patient connected to nasal cannula oxygen  Post-op Assessment: Report given to RN and Post -op Vital signs reviewed and stable  Post vital signs: Reviewed and stable  Last Vitals:  Vitals Value Taken Time  BP 116/69 01/09/19 1320  Temp 36.4 C 01/09/19 1235  Pulse 81 01/09/19 1322  Resp 17 01/09/19 1322  SpO2 96 % 01/09/19 1322  Vitals shown include unvalidated device data.  Last Pain:  Vitals:   01/09/19 1053  TempSrc: Temporal  PainSc: 0-No pain         Complications: No apparent anesthesia complications

## 2019-01-09 NOTE — Op Note (Addendum)
[facility] Patient Name: Holly Mueller Procedure Date : 01/09/2019 MRN: [id number] Attending MD: Justice Britain , MD Date of Birth: [birth year] CSN: [id number] Age: 53 Admit Type: Outpatient Procedure:                Upper EUS Indications:              Abnormal endoscopy, Esophageal varices, Gastric                            varices, Follow-up of gastric varices s/p                            Cyanoacrylate injection previously Providers:                Justice Britain, MD, Carlyn Reichert, RN, Cherylynn Ridges, Technician, Trixie Deis, CRNA Referring MD:              Medicines:                Monitored Anesthesia Care Complications:            No immediate complications. Estimated Blood Loss:     Estimated blood loss was minimal. Procedure:                Pre-Anesthesia Assessment:                           - Prior to the procedure, a History and Physical                            was performed, and patient medications and                            allergies were reviewed. The patient's tolerance of                            previous anesthesia was also reviewed. The risks                            and benefits of the procedure and the sedation                            options and risks were discussed with the patient.                            All questions were answered, and informed consent                            was obtained. Prior Anticoagulants: The patient has                            taken no previous anticoagulant or antiplatelet                            agents.  ASA Grade Assessment: III - A patient with                            severe systemic disease. After reviewing the risks                            and benefits, the patient was deemed in                            satisfactory condition to undergo the procedure.                           After obtaining informed consent, the endoscope was                             passed under direct vision. Throughout the                            procedure, the patient's blood pressure, pulse, and                            oxygen saturations were monitored continuously. The                            [id number]-[id number] ([id number]) Olympus gastroscope was                            introduced through the mouth, and advanced to the                            second part of duodenum. The [id number] ([id number])                            Olympus Radial EUS was introduced through the                            mouth, and advanced to the stomach for ultrasound                            examination from the esophagus and stomach. The                            upper EUS was accomplished without difficulty. The                            patient tolerated the procedure. Scope In: Scope Out: Findings:      ENDOSCOPIC FINDING: :      No gross lesions were noted in the proximal esophagus and in the mid       esophagus.      Grade II varices were found in the distal esophagus.      Type 2 gastroesophageal varices (GOV2, esophageal varices which extend       along the fundus) with no bleeding were found in the gastric fundus.  There were no stigmata of recent bleeding. They were medium in largest       diameter.      Patchy nodular mucosa was found in the gastric antrum. Biopsies were       taken with a cold forceps for histology.      No gross lesions were noted in the entire examined stomach. Biopsies       were taken with a cold forceps for histology and Helicobacter pylori       testing.      Whitish hued mucosa likely lymphangiectasias were present in the       duodenal bulb, in the first portion of the duodenum and in the second       portion of the duodenum. Biopsies were taken with a cold forceps for       histology.      ENDOSONOGRAPHIC FINDING: :      Endoscopic ultrasound examination revealed multiple tubal, anechoic       structures in the fundus of the  stomach consistent with varices.      Endoscopic ultrasound examination disclosed a few tubal, anechoic       structures in the thoracic esophagus and in the gastroesophageal       junction consistent with varices. Flow was seen on Doppler examination.      Endosonographic imaging in the visualized portion of the liver showed no       mass.      No malignant-appearing lymph nodes were visualized in the celiac region       (level 20) and perigastric region.      The celiac region was visualized. Impression:               EGD Impression:                           - No gross lesions in esophagus.                           - Grade II esophageal varices.                           - Type 2 gastroesophageal varices (GOV2, esophageal                            varices which extend along the fundus), without                            bleeding.                           - Nodular mucosa in the gastric antrum. Biopsied.                           - No gross lesions in the stomach. Biopsied.                           - Duodenal mucosal changes. Biopsied.                           EUS Impression:                           -  Varices were visualized endosonographically in                            the fundus of the stomach.                           - Varices were visualized endosonographically in                            the thoracic esophagus and in the gastroesophageal                            junction.                           - No malignant-appearing lymph nodes were                            visualized in the celiac region (level 20) and                            perigastric region. Recommendation:           - The patient will be observed post-procedure,                            until all discharge criteria are met.                           - Discharge patient to home.                           - Patient has a contact number available for                            emergencies. The  signs and symptoms of potential                            delayed complications were discussed with the                            patient. Return to normal activities tomorrow.                            Written discharge instructions were provided to the                            patient.                           - Observe patient's clinical course.                           - Await path results.                           - Continue PPI BID.                           -  Will discuss case with primary GI MD Carlean Purl.                            Based on the current findings, the patient will                            benefit from further Cyanoacrylate injection into                            the GV in order to try and decrease recurrent GI                            Bleeding. She has esophageal varices without                            stigmata that could be banded, but would minimize                            performing until we have optimized the gastric                            varices as able. If we are able to get                            Cyanoacrylate Protocol set up in next 2-3 weeks                            then will attempt to due injection here vs sending                            back to [facility] for repeat EGD/EUS                            in interim. Will be in touch with family/patient.                           - The findings and recommendations were discussed                            with the patient.                           - The findings and recommendations were discussed                            with the patient's family. Procedure Code(s):        --- Professional ---                           [phone], Esophagogastroduodenoscopy, flexible,                            transoral; with endoscopic ultrasound examination  limited to the esophagus, stomach or duodenum, and                            adjacent  structures                           43239, Esophagogastroduodenoscopy, flexible,                            transoral; with biopsy, single or multiple Diagnosis Code(s):        --- Professional ---                           I85.00, Esophageal varices without bleeding                           I86.4, Gastric varices                           K31.89, Other diseases of stomach and duodenum                           I89.0, Lymphedema, not elsewhere classified                           I89.9, Noninfective disorder of lymphatic vessels                            and lymph nodes, unspecified                           K92.9, Disease of digestive system, unspecified CPT copyright 2019 [employer] Association. All rights reserved. The codes documented in this report are preliminary and upon coder review may  be revised to meet current compliance requirements. Justice Britain, MD 01/09/2019 12:50:15 PM Number of Addenda: 0

## 2019-01-09 NOTE — H&P (Signed)
GASTROENTEROLOGY PROCEDURE H&P NOTE   Primary Care Physician: Cleophas Dunker, DO  HPI: Holly Mueller is a 53 y.o. female who presents for EGD/EUS.  Past Medical History:  Diagnosis Date  . Acute respiratory failure with hypoxia ([place]) 09/22/2018  . Acute respiratory failure with hypoxia ([place]) 12/09/2018  . Anxiety    "anxiety attacks- sometimes"  . Arthritis    bursitis left hip flares-not an issue  . Chronic kidney disease   . Community acquired pneumonia 11/28/2018  . Diabetes mellitus without complication ([place])    Type II  . Edentulous    10-19-13 at present  . Epilepsy ([place]) in 1972   No seizures since 1972. Previously treated with phenobarbital.   . Gastric varices with bleeding   . GERD (gastroesophageal reflux disease) 2008  . Headache(784.0)    hx of migraines   . 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 History of blood transfusion    gi bleed  . Hypothyroidism 2008  . Interstitial lung disease ([place])   . Leukopenia   . Lower esophageal ring 08/18/2013  . Pneumonia   . Primary biliary cholangitis ([place]) 10/24/2018  . Seasonal allergies 2003  . SEIZURES, HX OF 12/12/2009   Annotation: last seizure in early 1970s Qualifier: Diagnosis of  By: Carlena Sax  MD, Colletta [place]    . Shortness of breath   . Wears glasses    Past Surgical History:  Procedure Laterality Date  . ABDOMINAL HYSTERECTOMY     " partial "  . BALLOON DILATION N/A 10/24/2013   Procedure: BALLOON DILATION;  Surgeon: Gatha Mayer, MD;  Location: [facility];  Service: Endoscopy;  Laterality: N/A;  . BIOPSY  09/26/2018   Procedure: BIOPSY;  Surgeon: Thornton Park, MD;  Location: [facility];  Service: Gastroenterology;;  . CESAREAN SECTION     x2  . DENTAL SURGERY     multiple extractions 3'15  . DILATION AND CURETTAGE OF UTERUS    . ESOPHAGOGASTRODUODENOSCOPY N/A 10/24/2013   Procedure: ESOPHAGOGASTRODUODENOSCOPY (EGD);  Surgeon: Gatha Mayer, MD;  Location: [facility];  Service: Endoscopy;  Laterality: N/A;  . ESOPHAGOGASTRODUODENOSCOPY (EGD) WITH PROPOFOL N/A 09/26/2018   Procedure: ESOPHAGOGASTRODUODENOSCOPY (EGD) WITH PROPOFOL;  Surgeon: Thornton Park, MD;  Location: [facility];  Service: Gastroenterology;  Laterality: N/A;  . IR PARACENTESIS  09/23/2018  . LIVER BIOPSY  06/30/2012   Procedure: LIVER BIOPSY;  Surgeon: Shann Medal, MD;  Location: [facility];  Service: General;;  . NOVASURE ABLATION    . SHOULDER ARTHROSCOPY Right 2011  . UMBILICAL HERNIA REPAIR N/A 06/30/2012   Procedure: remove umbilicus;  Surgeon: Shann Medal, MD;  Location: [facility];  Service: General;  Laterality: N/A;  . VENTRAL HERNIA REPAIR N/A 06/30/2012   Procedure: LAPAROSCOPIC VENTRAL HERNIA;  Surgeon: Shann Medal, MD;  Location: [facility];  Service: General;  Laterality: N/A;  With Mesh  . VIDEO BRONCHOSCOPY Bilateral 07/20/2018   Procedure: VIDEO BRONCHOSCOPY WITHOUT FLUORO;  Surgeon: Brand Males, MD;  Location: [facility];  Service: Cardiopulmonary;  Laterality: Bilateral;  . WISDOM TOOTH EXTRACTION     Current Facility-Administered Medications  Medication Dose Route Frequency Provider Last Rate Last Dose  . lactated ringers infusion   Intravenous Continuous Mansouraty, Telford Nab., MD 10 mL/hr at 01/09/19 1100     Allergies  Allergen Reactions  . Aspirin Nausea Only    Upset stomach   Family History  Problem Relation Age of Onset  . Hypertension Mother   .  Diabetes Mother   . Cirrhosis Mother        ? medications  . Lung cancer Father   . Stroke Father   . Diabetes Sister   . Hypertension Sister   . Colon cancer Neg Hx   . Esophageal cancer Neg Hx   . Rectal cancer Neg Hx    Social History   Socioeconomic History  . Marital status: Single    Spouse name: Not on file  . Number of children: 2  . Years of education: Not on file  . Highest education level: Not on file  Occupational History  . Occupation: unemployed  Social Needs  .  Financial resource strain: Very hard  . Food insecurity    Worry: Sometimes true    Inability: Sometimes true  . Transportation needs    Medical: Yes    Non-medical: Yes  Tobacco Use  . Smoking status: Former Smoker    Packs/day: 0.25    Types: Cigarettes    Quit date: 1997    Years since quitting: 23.6  . Smokeless tobacco: Never Used  . Tobacco comment: social-only tried never smoked a whole day 01/02/19  Substance and Sexual Activity  . Alcohol use: Not Currently  . Drug use: No  . Sexual activity: Not Currently  Lifestyle  . Physical activity    Days per week: 3 days    Minutes per session: 40 min  . Stress: Rather much  Relationships  . Social connections    Talks on phone: More than three times a week    Gets together: More than three times a week    Attends religious service: Never    Active member of club or organization: No    Attends meetings of clubs or organizations: Never    Relationship status: Never married  . Intimate partner violence    Fear of current or ex partner: Patient refused    Emotionally abused: Patient refused    Physically abused: Patient refused    Forced sexual activity: Patient refused  Other Topics Concern  . 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 BMET No results for input(s): NA, K, CL, CO2,  GLUCOSE, BUN, CREATININE, CALCIUM in the last 72 hours. LFT No results for input(s): PROT, ALBUMIN, AST, ALT, ALKPHOS, BILITOT, BILIDIR, IBILI in the last 72 hours. PT/INR No results for input(s): LABPROT, INR in the last 72 hours.   Impression / Plan: This is a 53 y.o.female who presents for EGD/EUS.  The risks of EUS including bleeding, infection, aspiration pneumonia and intestinal perforation were discussed as was the possibility it may not give a definitive diagnosis.  If a biopsy of the pancreas is done as part of the  EUS, there is an additional risk of pancreatitis at the rate of about 1%.  It was explained that procedure related pancreatitis is typically mild, although can be severe and even life threatening, which is why we do not perform random pancreatic biopsies and only biopsy a lesion we feel is concerning enough to warrant the risk.  The risks and benefits of endoscopic evaluation were discussed with the patient; these include but are not limited to the risk of perforation, infection, bleeding, missed lesions, lack of diagnosis, severe illness requiring hospitalization, as well as anesthesia and sedation related illnesses.  The patient is agreeable to proceed.    Justice Britain, MD [facility] Office # [phone]

## 2019-01-10 ENCOUNTER — Encounter (HOSPITAL_COMMUNITY): Payer: Self-pay | Admitting: Gastroenterology

## 2019-01-10 NOTE — Telephone Encounter (Signed)
Scheduled pt for 01/11/2019-pr

## 2019-01-10 NOTE — Anesthesia Postprocedure Evaluation (Signed)
Anesthesia Post Note  Patient: Holly Mueller  Procedure(s) Performed: UPPER ESOPHAGEAL ENDOSCOPIC ULTRASOUND (EUS) (N/A ) ESOPHAGOGASTRODUODENOSCOPY (EGD) (N/A ) BIOPSY     Patient location during evaluation: PACU Anesthesia Type: MAC Level of consciousness: awake and alert Pain management: pain level controlled Vital Signs Assessment: post-procedure vital signs reviewed and stable Respiratory status: spontaneous breathing Cardiovascular status: stable Anesthetic complications: no    Last Vitals:  Vitals:   01/09/19 1320 01/09/19 1333  BP: 116/69 120/74  Pulse: 77 75  Resp: (!) 23 (!) 21  Temp:    SpO2: 98% 97%    Last Pain:  Vitals:   01/09/19 1333  TempSrc:   PainSc: 0-No pain                 Nolon Nations

## 2019-01-10 NOTE — Progress Notes (Signed)
_0  ID: Holly Mueller, female    DOB: [birth year], 53 y.o.   MRN: [id number]  Chief Complaint  Patient presents with   Follow-up    breathing 'about the same' - dry cough    Referring provider: Cleophas Dunker, *  HPI:  53 year old female former smoker followed in our office for ILD as well as restrictive lung disease  PMH: obesity, type 2 diabetes, depression, GERD, cirrhosis Smoker/ Smoking History: Former smoker.  Quit 1997.  Limited smoking history. Maintenance: Symbicort 80 Pt of: Dr. Chase Caller  01/11/2019  - Visit   53 year old female former smoker followed in our office for interstitial lung disease, restrictive lung disease, and chronic respiratory failure.  Patient presenting today after completing a pulmonary function test.  Unfortunately patient was unable to complete post spirometry.  She was able to successfully complete a spirometry with DLCO.  Those results are listed below:  01/11/2019-spirometry with DLCO-FVC 1.88 (49% predicted), ratio 83, FEV1 1.55 (52% predicted), DLCO 11.45 (50% predicted)  Patient presenting today on 2 L of continuous oxygen.  She is unsure when she is going to be able to come off the oxygen.  She would like to be able to stop it.  She continues to be maintained on Symbicort 80.  She is also using her rescue inhaler about 2 times a week.  Patient reporting that she is now living with her sister in [place].  It is very difficult for her to coordinate care in [place].  Is difficult for her to coordinate rides.  She continues to not have health insurance.  She reports that she is a tempted to file for Medicaid multiple times but continues to be denied.    Tests:   01/05/2019-SARS-CoV-2-negative  12/09/2018-hypersensitivity pneumonitis-negative  04/05/2018-CT chest high-res- appearance of lungs is compatible with interstitial lung disease, probable UIP based off of ATS guidelines, repeat high-resolution CT chest in 12 months  to assess for temporal changes, cirrhosis with stigmata of portal venous hypertension including splenomegaly  12/09/2018-CT chest high-res- examination for ILD is limited due to low lung volumes, there is probable bibasilar partial atelectasis and mild bibasilar fibrotic changes the lateral lung bases May need to consider repeating CT as this was a limited study  12/09/2018-CT abdomen- bilateral nonobstructing renal calculi measuring up to 5 mm, cirrhosis, splenomegaly, large perigastric varices with posttreatment changes related to prior gastric varix embolization  09/23/2018- IR paracentesis- 1850 mL's removed  09/22/2018-CTA chest- negative for PE, limited scan, cirrhosis, spectrum of findings compatible with chronic interstitial lung disease  09/24/2018-echocardiogram- LV ejection fraction 60 to 65%  07/12/2018-pulmonary function test- spirometry with DLCO-FVC 1.87 (50% predicted), ratio 84, FEV1 1.58 (53% predicted), DLCO 14.68 (66% predicted)  01/11/2019-spirometry with DLCO-FVC 1.88 (49% predicted), ratio 83, FEV1 1.55 (52% predicted), DLCO 11.45 (50% predicted)  ILD profile 09/16/2017 Hypersensitivity patient is panel negative ANA 1:80 speckled CCP < 16, double-stranded DNA 2, rheumatoid factor less than 14 Ro >8, La < 1, SCL 70-, RNP negative  SIX MIN WALK 01/11/2019 12/07/2018 07/12/2018 05/25/2018  Supplimental Oxygen during Test? (L/min) No No No No  Tech Comments: moderate walking pace off oxygen - stopped once for about 15 sec with mild cough and complaint of SOB - no desaturation.  Able to complete walk without difficulty. Joella Prince RN steady walk, no desat/TACMA Patient walked a moderate pace on RA. Denies chest pain.  Moderate pace - complaint of mild SOB final lap - no desaturation.  Joella Prince RN  FENO:  Lab Results  Component Value Date   NITRICOXIDE <5 05/25/2018    PFT: PFT Results Latest Ref Rng & Units 01/11/2019 07/12/2018 11/13/2016  FVC-Pre L 1.88 1.87 1.80   FVC-Predicted Pre % 49 50 48  FVC-Post L - - 1.84  FVC-Predicted Post % - - 49  Pre FEV1/FVC % % 83 84 85  Post FEV1/FCV % % - - 82  FEV1-Pre L 1.55 1.58 1.54  FEV1-Predicted Pre % 52 53 52  FEV1-Post L - - 1.52  DLCO UNC% % 50 66 -  DLCO COR %Predicted % 93 128 -    Imaging: No results found.    Specialty Problems      Pulmonary Problems   Restrictive lung disease    Chest x-ray 04/05/15- bibasilar opacities. Chest x-ray 11/16/15-left basal scarring.  CTA 11/23/16- no pulmonary embolism, no interstitial lung disease, cirrhosis, abdominal adenopathy. CTA 06/30/2017-no PE faint groundglass opacities, hepatic cirrhosis CTA 09/11/2017-no PE, diffuse groundglass opacity, cirrhosis. All images reviewed.  FENO 01/07/17- 6 FENO 07/06/17 <5  PFTs 11/13/16 FVC 1.84 (49%), FEV1 1.52 (51%), F/F 82, TLC 44% Unable to complete DLCO Severe restriction. No obstruction  CBC 12/02/16- WBC 1.4, platelets 66, eosinophil 4%, absolute eosinophil count 100 Blood allergy profile 03/06/16-negative, IgE less than 2,   ANA, Sjorgren Ab, dsDNA , CCP all negative       Shortness of breath    Qualifier: Diagnosis of  By: Carlena Sax  MD, Colletta [place]        DOE (dyspnea on exertion)   Cough   ILD (interstitial lung disease) ([place])   Chronic respiratory failure with hypoxia (HCC)      Allergies  Allergen Reactions   Aspirin Nausea Only    Upset stomach    Immunization History  Administered Date(s) Administered   Influenza-Unspecified 01/11/2019   Pneumococcal Polysaccharide-23 08/26/2011, 09/12/2017   Td 05/11/2006   Flu vaccine today   Past Medical History:  Diagnosis Date   Acute respiratory failure with hypoxia ([place]) 09/22/2018   Acute respiratory failure with hypoxia ([place]) 12/09/2018   Anxiety    "anxiety attacks- sometimes"   Arthritis    bursitis left hip flares-not an issue   Chronic kidney disease    Community acquired pneumonia 11/28/2018   Diabetes mellitus without  complication ([place])    Type II   Edentulous    10-19-13 at present   Epilepsy ([place]) in 1972   No seizures since 1972. Previously treated with phenobarbital.    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 ([place])    History of blood transfusion    gi bleed   Hypothyroidism 2008   Interstitial lung disease (HCC)    Leukopenia    Lower esophageal ring 08/18/2013   Pneumonia    Primary biliary cholangitis ([place]) 10/24/2018   Seasonal allergies 2003   SEIZURES, HX OF 12/12/2009   Annotation: last seizure in early 1970s Qualifier: Diagnosis of  By: Carlena Sax  MD, Colletta [place]     Shortness of breath    Wears glasses     Tobacco History: Social History   Tobacco Use  Smoking Status Former Smoker   Packs/day: 0.25   Types: Cigarettes   Quit date: 1997   Years since quitting: 23.6  Smokeless Tobacco Never Used  Tobacco Comment   social-only tried never smoked a whole day 01/02/19  Counseling given: Yes Comment: social-only tried never smoked a whole day 01/02/19   Continue to not smoke  Outpatient Encounter Medications as of 01/11/2019  Medication Sig   acetaminophen (TYLENOL) 500 MG tablet Take 1,000 mg by mouth every 6 (six) hours as needed for mild pain.   albuterol (PROAIR HFA) 108 (90 Base) MCG/ACT inhaler INHALE 2 PUFFS INTO THE LUNGS EVERY 6 HOURS AS NEEDED FOR SHORTNESS OF BREATH (Patient taking differently: Inhale 2 puffs into the lungs every 4 (four) hours as needed for wheezing or shortness of breath. )   albuterol (PROVENTIL) (2.5 MG/3ML) 0.083% nebulizer solution Take 3 mLs (2.5 mg total) by nebulization every 6 (six) hours as needed for wheezing or shortness of breath.   benzonatate (TESSALON) 100 MG capsule Take 1 capsule (100 mg total) by mouth at bedtime.   blood glucose meter kit and supplies KIT Dispense based on patient and  insurance preference. Use up to four times daily as directed. (FOR ICD-9 250.00, 250.01).   blood glucose meter kit and supplies Dispense based on patient and insurance preference. Use up to four times daily as directed. (FOR ICD-10 E10.9, E11.9).   budesonide-formoterol (SYMBICORT) 80-4.5 MCG/ACT inhaler TAKE 2 PUFFS BY MOUTH TWICE A DAY   cetirizine (ZYRTEC) 10 MG tablet Take 1 tablet (10 mg total) by mouth daily.   fluticasone (FLONASE) 50 MCG/ACT nasal spray Place 2 sprays into both nostrils daily.   glucose blood (TRUE METRIX BLOOD GLUCOSE TEST) test strip Use as instructed   guaiFENesin (MUCINEX) 600 MG 12 hr tablet Take 600 mg by mouth 2 (two) times daily as needed for cough.   levothyroxine (SYNTHROID) 125 MCG tablet Take 2 tablets (250 mcg total) by mouth daily.   metFORMIN (GLUCOPHAGE) 500 MG tablet Take 1 tablet (500 mg total) by mouth 2 (two) times daily with a meal.   OXYGEN Inhale into the lungs. Use sleeping and when out and about   pantoprazole (PROTONIX) 40 MG tablet Take 1 tablet (40 mg total) by mouth 2 (two) times daily before a meal.   SPIRIVA HANDIHALER 18 MCG inhalation capsule INHALE 1 CAPSULE VIA HANDIHALER ONCE DAILY AT THE SAME TIME EVERY DAY   TRUEplus Lancets 28G MISC Use to check blood sugar as directed.   ursodiol (ACTIGALL) 500 MG tablet Take 1 tablet (500 mg total) by mouth 3 (three) times daily.   budesonide-formoterol (SYMBICORT) 80-4.5 MCG/ACT inhaler Inhale 2 puffs into the lungs 2 (two) times daily for 1 day.   No facility-administered encounter medications on file as of 01/11/2019.      Review of Systems  Review of Systems  Constitutional: Positive for fatigue. Negative for activity change and fever.  HENT: Negative for sinus pressure, sinus pain and sore throat.   Respiratory: Positive for cough, shortness of breath and wheezing.   Cardiovascular: Negative for chest pain and palpitations.  Gastrointestinal: Negative for diarrhea, nausea  and vomiting.  Musculoskeletal: Negative for arthralgias.  Neurological: Negative for dizziness.  Psychiatric/Behavioral: Negative for sleep disturbance. The patient is not nervous/anxious.      Physical Exam  BP 116/68 (BP Location: Left Arm, Patient Position: Sitting, Cuff Size: Normal)    Pulse 86    Temp 98.3 F (36.8 C)    Ht 5' 5.5" (1.664 m)    Wt 235 lb (106.6 kg)    LMP  (LMP Unknown)    SpO2 96% Comment: 2l CONTINUOUS   BMI 38.51 kg/m   Wt Readings from Last 5 Encounters:  01/11/19 235 lb (106.6 kg)  01/09/19 229 lb (103.9 kg)  01/02/19 229 lb 9.6 oz (104.1 kg)  12/29/18 225 lb (102.1 kg)  12/10/18 225 lb 4.8 oz (102.2 kg)     Physical Exam Vitals signs and nursing note reviewed.  Constitutional:      General: She is not in acute distress.    Appearance: Normal appearance. She is obese.     Comments: Chronically ill adult female  HENT:     Head: Normocephalic and atraumatic.     Right Ear: Tympanic membrane, ear canal and external ear normal. There is no impacted cerumen.     Left Ear: Tympanic membrane, ear canal and external ear normal. There is no impacted cerumen.     Nose: Nose normal. No congestion or rhinorrhea.     Mouth/Throat:     Mouth: Mucous membranes are moist.     Pharynx: Oropharynx is clear.  Eyes:     Pupils: Pupils are equal, round, and reactive to light.  Neck:     Musculoskeletal: Normal range of motion.  Cardiovascular:     Rate and Rhythm: Normal rate and regular rhythm.     Pulses: Normal pulses.     Heart sounds: Normal heart sounds. No murmur.  Pulmonary:     Effort: Pulmonary effort is normal. No respiratory distress.     Breath sounds: No decreased air movement. Examination of the right-middle field reveals rales. Examination of the left-middle field reveals rales. Examination of the right-lower field reveals rales. Examination of the left-lower field reveals rales. Rales present. No decreased breath sounds or wheezing.     Comments:  Crackles right greater than left Abdominal:     General: Abdomen is flat. Bowel sounds are normal.     Palpations: Abdomen is soft.     Comments: Obese  Musculoskeletal:     Right lower leg: No edema.     Left lower leg: No edema.  Skin:    General: Skin is warm and dry.     Capillary Refill: Capillary refill takes less than 2 seconds.  Neurological:     General: No focal deficit present.     Mental Status: She is alert and oriented to person, place, and time. Mental status is at baseline.     Gait: Gait (tolerated walk in office) normal.  Psychiatric:        Mood and Affect: Mood normal.        Behavior: Behavior normal.        Thought Content: Thought content normal.        Judgment: Judgment normal.      Lab Results:  CBC    Component Value Date/Time   WBC 1.0 (LL) 01/09/2019 1105   RBC 4.09 01/09/2019 1105   HGB 11.7 (L) 01/09/2019 1105   HGB 8.5 (L) 09/13/2018 1447   HGB 12.5 11/17/2016 1204   HCT 36.0 01/09/2019 1105   HCT 26.1 (L) 09/13/2018 1447   HCT 36.2 11/17/2016 1204   PLT 56 (L) 01/09/2019 1105   PLT 64 (LL) 09/13/2018 1447   MCV 88.0 01/09/2019 1105   MCV 84 09/13/2018 1447   MCV 86.8 11/17/2016 1204   MCH 28.6 01/09/2019 1105   MCHC 32.5 01/09/2019 1105   RDW 15.4 01/09/2019 1105   RDW 14.4 09/13/2018 1447   RDW 14.1 11/17/2016 1204   LYMPHSABS 0.2 (L) 11/21/2018 0611   LYMPHSABS 0.6 (L) 03/03/2018 1108   LYMPHSABS 0.6 (L) 11/17/2016 1204   MONOABS  0.2 11/21/2018 0611   MONOABS 0.2 11/17/2016 1204   EOSABS 0.0 11/21/2018 0611   EOSABS 0.1 03/03/2018 1108   BASOSABS 0.0 11/21/2018 0611   BASOSABS 0.0 03/03/2018 1108   BASOSABS 0.0 11/17/2016 1204    BMET    Component Value Date/Time   NA 141 12/11/2018 0401   NA 138 07/26/2018 1440   NA 137 11/17/2016 1208   K 3.6 12/11/2018 0401   K 4.2 11/17/2016 1208   CL 110 12/11/2018 0401   CO2 25 12/11/2018 0401   CO2 24 11/17/2016 1208   GLUCOSE 149 (H) 12/11/2018 0401   GLUCOSE 216 (H)  11/17/2016 1208   BUN 7 12/11/2018 0401   BUN 8 07/26/2018 1440   BUN 7.2 11/17/2016 1208   CREATININE 0.74 12/11/2018 0401   CREATININE 0.8 11/17/2016 1208   CALCIUM 8.6 (L) 12/11/2018 0401   CALCIUM 9.3 11/17/2016 1208   GFRNONAA >60 12/11/2018 0401   GFRAA >60 12/11/2018 0401    BNP    Component Value Date/Time   BNP 9.3 08/28/2018 0646    ProBNP    Component Value Date/Time   PROBNP 35.0 09/22/2018 1420      Assessment & Plan:   ILD (interstitial lung disease) ([place]) Discussion: Patient has probable worsened pulmonary fibrosis based off of pulmonary function tests as well as exam, July/2020 CT chest was a poor imaging study.  Unfortunately due to patient's cirrhosis she would not be a candidate for anti-fibrotic's.  We will hold off on repeating CT imaging at this time.  Likely will just need to be able to proceed forward with supportive care.  I have discussed this case with Dr. Chase Caller.  Plan: Referral to palliative care today Walk in office today Continue oxygen therapy when exercising or increasing physical  Flu vaccine today   Restrictive lung disease Discussion: Stable FVC on pulmonary function testing today Likely restrictive lung disease due to obesity as well as interstitial lung disease  Plan: Continue Symbicort 80 Walk today in office Oxygen as needed   Chronic respiratory failure with hypoxia ([place]) Discussion:  Walk today in office patient did not require oxygen on walk today.  Patient likely would need oxygen if performing multiple physical activities at 1 time.  Unfortunately the patient does not have a pulse oximeter at home in order to monitor oxygen saturations.  Due to her not being able to accurately monitor her oxygen saturations have encouraged her to wear her oxygen if she is running errands or exercising.  If she needs to go from her bedroom to the kitchen or to the bathroom quickly she does not need to wear oxygen.  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) Plan: Continue follow-up with gastroenterology Palliative care referral today  Complex care coordination Discussion: I agree with previously stated primary care provider's notes stating that this patient has many needs that are affecting her overall quality of life as well as complex medical care.  Patient reports that she is applying for Medicaid but has been denied.  Patient is currently living with her sister.  Patient struggles with care coordination because she cannot coordinate rides.  She would prefer virtual visits if at all possible.  I am concerned regarding the patient's lack of insurance as well as inability to coordinate care.  Plan: I will follow-up with primary care regarding  these concerns as well as to get an update regarding their support with working with the patient Palliative care referral today    Return in about 6 weeks (around 02/22/2019), or if symptoms worsen or fail to improve, for Follow up with Dr. Purnell Shoemaker, Follow up with Eric Form ACAGNP-BC, Follow up with Wyn Quaker FNP-C.   Lauraine Rinne, NP 01/11/2019   This appointment was 42 minutes long with over 50% of the time in direct face-to-face patient care, assessment, plan of care, and follow-up.  I have also discussed this case with Dr. Chase Caller.

## 2019-01-11 ENCOUNTER — Encounter: Payer: Self-pay | Admitting: Pulmonary Disease

## 2019-01-11 ENCOUNTER — Ambulatory Visit (INDEPENDENT_AMBULATORY_CARE_PROVIDER_SITE_OTHER): Payer: Self-pay | Admitting: Pulmonary Disease

## 2019-01-11 ENCOUNTER — Ambulatory Visit (INDEPENDENT_AMBULATORY_CARE_PROVIDER_SITE_OTHER): Payer: Medicaid Other | Admitting: Pulmonary Disease

## 2019-01-11 ENCOUNTER — Other Ambulatory Visit: Payer: Self-pay

## 2019-01-11 VITALS — BP 116/68 | HR 86 | Temp 98.3°F | Ht 65.5 in | Wt 235.0 lb

## 2019-01-11 DIAGNOSIS — Z7189 Other specified counseling: Secondary | ICD-10-CM

## 2019-01-11 DIAGNOSIS — J9611 Chronic respiratory failure with hypoxia: Secondary | ICD-10-CM | POA: Insufficient documentation

## 2019-01-11 DIAGNOSIS — J849 Interstitial pulmonary disease, unspecified: Secondary | ICD-10-CM

## 2019-01-11 DIAGNOSIS — K743 Primary biliary cirrhosis: Secondary | ICD-10-CM

## 2019-01-11 DIAGNOSIS — J984 Other disorders of lung: Secondary | ICD-10-CM

## 2019-01-11 LAB — PULMONARY FUNCTION TEST
DL/VA % pred: 93 %
DL/VA: 3.94 ml/min/mmHg/L
DLCO unc % pred: 50 %
DLCO unc: 11.45 ml/min/mmHg
FEF 25-75 Pre: 1.79 L/sec
FEF2575-%Pred-Pre: 64 %
FEV1-%Pred-Pre: 52 %
FEV1-Pre: 1.55 L
FEV1FVC-%Pred-Pre: 103 %
FEV6-%Pred-Pre: 51 %
FEV6-Pre: 1.88 L
FEV6FVC-%Pred-Pre: 102 %
FVC-%Pred-Pre: 49 %
FVC-Pre: 1.88 L
Pre FEV1/FVC ratio: 83 %
Pre FEV6/FVC Ratio: 100 %

## 2019-01-11 MED ORDER — BUDESONIDE-FORMOTEROL FUMARATE 80-4.5 MCG/ACT IN AERO: 2.0000 | INHALATION_SPRAY | Freq: Two times a day (BID) | RESPIRATORY_TRACT | 0 refills | Status: AC

## 2019-01-11 NOTE — Assessment & Plan Note (Signed)
Plan: Continue follow-up with gastroenterology Palliative care referral today

## 2019-01-11 NOTE — Assessment & Plan Note (Signed)
Discussion: I agree with previously stated primary care provider's notes stating that this patient has many needs that are affecting her overall quality of life as well as complex medical care.  Patient reports that she is applying for Medicaid but has been denied.  Patient is currently living with her sister.  Patient struggles with care coordination because she cannot coordinate rides.  She would prefer virtual visits if at all possible.  I am concerned regarding the patient's lack of insurance as well as inability to coordinate care.  Plan: I will follow-up with primary care regarding these concerns as well as to get an update regarding their support with working with the patient Palliative care referral today

## 2019-01-11 NOTE — Patient Instructions (Addendum)
You were seen today by Lauraine Rinne, NP  for:   1. ILD (interstitial lung disease) ([place])  Discussed her case with Dr. Chase Caller today.  We will hold off on repeat high-resolution CT imaging as you are not a candidate for anti-fibrotic's due to your liver cirrhosis.  Walk today in office to further evaluate oxygen levels  Order placed for palliative care  2. Chronic respiratory failure with hypoxia (HCC)   Walk today in office >>> Tolerated 3 laps without oxygen this is good news  You need to purchase a pulse oximeter to further evaluate oxygen needs and monitor.  We need to maintain oxygen saturations greater than 88%.  3. Restrictive lung disease  Continue Symbicort 80 >>> 2 puffs in the morning right when you wake up, rinse out your mouth after use, 12 hours later 2 puffs, rinse after use >>> Take this daily, no matter what >>> This is not a rescue inhaler   Continue oxygen therapy as needed  Walk today in office  Continue to work diligently on increasing daily physical activity and weight loss  4. 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 Purnell Shoemaker, Follow up with Eric Form ACAGNP-BC, Follow up with Wyn Quaker FNP-C.   Please do your part to reduce the spread of COVID-19:      Reduce your risk of any infection  and COVID19 by using the similar precautions used for avoiding the common cold or flu:  Marland Kitchen Wash your hands often with soap and warm water for at least 20 seconds.  If soap and water are not readily available, use an alcohol-based hand sanitizer with at least 60% alcohol.  . If coughing or sneezing, cover your mouth and nose by coughing or sneezing into the elbow areas of your shirt or coat, into a tissue or into your sleeve (not your hands). [name] A MASK when in  public  . Avoid shaking hands with others and consider head nods or verbal greetings only. . Avoid touching your eyes, nose, or mouth with unwashed hands.  . Avoid close contact with people who are sick. . Avoid places or events with large numbers of people in one location, like concerts or sporting events. . If you have some symptoms but not all symptoms, continue to monitor at home and seek medical attention if your symptoms worsen. . If you are having a medical emergency, call 911.   [place] / e-Visit: [email or web address]         [facility]: [facility]: [phone]                   [facility]: [phone]     It is flu season:   >>> Best ways to protect herself from the flu: Receive the yearly flu vaccine, practice good hand hygiene washing with soap and also using hand sanitizer when available, eat a nutritious meals, get adequate rest, hydrate appropriately   Please contact the office if your symptoms worsen or you have concerns that you are not improving.   Thank you for choosing [facility] for your healthcare, and for allowing [place] to partner with you on your healthcare journey. I am thankful to be able to  provide care to you today.   Wyn Quaker FNP-C   Influenza Virus Vaccine injection What is this medicine? INFLUENZA VIRUS VACCINE (in floo EN zuh VAHY ruhs vak SEEN) helps to reduce the risk of getting influenza also known as the flu. The vaccine only helps protect you against some strains of the flu. This medicine may be used for other purposes; ask your health care provider or pharmacist if you have questions. COMMON BRAND NAME(S): Afluria, Afluria Quadrivalent, Agriflu, Alfuria, FLUAD, Fluarix, Fluarix Quadrivalent, Flublok, Flublok Quadrivalent, FLUCELVAX, Flulaval, Fluvirin, Fluzone, Fluzone  High-Dose, Fluzone Intradermal What should I tell my health care provider before I take this medicine? They need to know if you have any of these conditions:  bleeding disorder like hemophilia  fever or infection  Guillain-Barre syndrome or other neurological problems  immune system problems  infection with the human immunodeficiency virus (HIV) or AIDS  low blood platelet counts  multiple sclerosis  an unusual or allergic reaction to influenza virus vaccine, latex, other medicines, foods, dyes, or preservatives. Different brands of vaccines contain different allergens. Some may contain latex or eggs. Talk to your doctor about your allergies to make sure that you get the right vaccine.  pregnant or trying to get pregnant  breast-feeding How should I use this medicine? This vaccine is for injection into a muscle or under the skin. It is given by a health care professional. A copy of Vaccine Information Statements will be given before each vaccination. Read this sheet carefully each time. The sheet may change frequently. Talk to your healthcare provider to see which vaccines are right for you. Some vaccines should not be used in all age groups. Overdosage: If you think you have taken too much of this medicine contact a poison control center or emergency room at once. NOTE: This medicine is only for you. Do not share this medicine with others. What if I miss a dose? This does not apply. What may interact with this medicine?  chemotherapy or radiation therapy  medicines that lower your immune system like etanercept, anakinra, infliximab, and adalimumab  medicines that treat or prevent blood clots like warfarin  phenytoin  steroid medicines like prednisone or cortisone  theophylline  vaccines This list may not describe all possible interactions. Give your health care provider a list of all the medicines, herbs, non-prescription drugs, or dietary supplements you use. Also tell  them if you smoke, drink alcohol, or use illegal drugs. Some items may interact with your medicine. What should I watch for while using this medicine? Report any side effects that do not go away within 3 days to your doctor or health care professional. Call your health care provider if any unusual symptoms occur within 6 weeks of receiving this vaccine. You may still catch the flu, but the illness is not usually as bad. You cannot get the flu from the vaccine. The vaccine will not protect against colds or other illnesses that may cause fever. The vaccine is needed every year. What side effects may I notice from receiving this medicine? Side effects that you should report to your doctor or health care professional as soon as possible:  allergic reactions like skin rash, itching or hives, swelling of the face, lips, or tongue Side effects that usually do not require medical attention (report to your doctor or health care professional if they continue or are bothersome):  fever  headache  muscle aches and pains  pain, tenderness, redness, or swelling at the injection site  tiredness This list may not describe all possible side effects. Call your doctor for medical advice about side effects. You may report side effects to FDA at 1-800-FDA-1088. Where should I keep my medicine? The vaccine will be given by a health care professional in a clinic, pharmacy, doctor's office, or other health care setting. You will not be given vaccine doses to store at home. NOTE: This sheet is a summary. It may not cover all possible information. If you have questions about this medicine, talk to your doctor, pharmacist, or health care provider.  2020 Elsevier/Gold Standard (2018-03-22 08:45:43)

## 2019-01-11 NOTE — Assessment & Plan Note (Addendum)
Discussion:  Walk today in office patient did not require oxygen on walk today.  Patient likely would need oxygen if performing multiple physical activities at 1 time.  Unfortunately the patient does not have a pulse oximeter at home in order to monitor oxygen saturations.  Due to her not being able to accurately monitor her oxygen saturations have encouraged her to wear her oxygen if she is running errands or exercising.  If she needs to go from her bedroom to the kitchen or to the bathroom quickly she does not need to wear oxygen.  She does not need to wear oxygen at rest  Plan: Continue oxygen therapy as outlined Purchase a pulse oximeter monitor oxygen saturations

## 2019-01-11 NOTE — Assessment & Plan Note (Signed)
Discussion: Stable FVC on pulmonary function testing today Likely restrictive lung disease due to obesity as well as interstitial lung disease  Plan: Continue Symbicort 80 Walk today in office Oxygen as needed

## 2019-01-11 NOTE — Assessment & Plan Note (Signed)
Discussion: Patient has probable worsened pulmonary fibrosis based off of pulmonary function tests as well as exam, July/2020 CT chest was a poor imaging study.  Unfortunately due to patient's cirrhosis she would not be a candidate for anti-fibrotic's.  We will hold off on repeating CT imaging at this time.  Likely will just need to be able to proceed forward with supportive care.  I have discussed this case with Dr. Chase Caller.  Plan: Referral to palliative care today Walk in office today Continue oxygen therapy when exercising or increasing physical  Flu vaccine today

## 2019-01-11 NOTE — Progress Notes (Signed)
Spiro/DLCO performed today. Patient was not able to complete a full PFT due to cough and headache.

## 2019-01-13 ENCOUNTER — Telehealth: Payer: Self-pay | Admitting: Internal Medicine

## 2019-01-13 ENCOUNTER — Encounter: Payer: Self-pay | Admitting: Gastroenterology

## 2019-01-13 NOTE — Telephone Encounter (Signed)
LMTCB- needs ILD appt with MR

## 2019-01-17 NOTE — Telephone Encounter (Signed)
Spoke with the pt  She is already scheduled for appt 02/06/19

## 2019-01-17 NOTE — Telephone Encounter (Signed)
Patient is returning phone call.  Patient phone number is [phone].

## 2019-01-30 ENCOUNTER — Telehealth: Payer: Self-pay | Admitting: Internal Medicine

## 2019-01-30 NOTE — Telephone Encounter (Signed)
Pt called stating that after her procedure with Dr. Rush Landmark, she was told that he and Dr. Carlean Purl were going to check for approval on a procedure to do a glue on her stomach. Pt states that she has had this procedure at [facility] but they were going to check if she could have it done at [facility] hospital. She wants to know the status of that. Pls call her.

## 2019-01-30 NOTE — Telephone Encounter (Signed)
See note below and advise.

## 2019-02-01 NOTE — Telephone Encounter (Signed)
Tell her we think we will get it here in [place] - hopefully soon  I will contact her again next week

## 2019-02-01 NOTE — Telephone Encounter (Signed)
Spoke with pt and she is aware. 

## 2019-02-03 ENCOUNTER — Ambulatory Visit: Payer: Medicaid Other | Admitting: Pulmonary Disease

## 2019-02-06 ENCOUNTER — Other Ambulatory Visit: Payer: Self-pay

## 2019-02-06 ENCOUNTER — Ambulatory Visit: Payer: Self-pay | Admitting: Internal Medicine

## 2019-02-06 ENCOUNTER — Telehealth: Payer: Self-pay | Admitting: Internal Medicine

## 2019-02-06 ENCOUNTER — Encounter: Payer: Self-pay | Admitting: Internal Medicine

## 2019-02-06 VITALS — BP 124/66 | HR 82 | Ht 66.0 in | Wt 241.2 lb

## 2019-02-06 DIAGNOSIS — J849 Interstitial pulmonary disease, unspecified: Secondary | ICD-10-CM

## 2019-02-06 DIAGNOSIS — J9611 Chronic respiratory failure with hypoxia: Secondary | ICD-10-CM

## 2019-02-06 NOTE — Patient Instructions (Addendum)
ICD-10-CM   1. Chronic respiratory failure with hypoxia (HCC)  J96.11   2. 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 IF not we should test you for ONO test on room air at home  -  Refer [place] our pharmacists to review liver safe medications and if ok to start tussionex for cough   Followup  - 3 months or sooner if needed

## 2019-02-06 NOTE — Telephone Encounter (Signed)
Pt returning call and can ber reached @ [phone].Holly Mueller

## 2019-02-06 NOTE — Telephone Encounter (Signed)
She needs to go on permanent disability Till then any work that let her sit and do or that will allow exertion but with o2 on she can do Cannot get exposed to cigarrettes during this time

## 2019-02-06 NOTE — Addendum Note (Signed)
Addended by: Len Blalock on: 02/06/2019 11:54 AM   Modules accepted: Orders

## 2019-02-06 NOTE — Telephone Encounter (Signed)
Call returned to patient, she reports she was started on oxygen. She reports due to her scar tissue and liver cirrhosis she was unable to be started on the medication the MR originally wanted her on. She wants to know if she is able to go back to work. 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 She reports now that she has moved to [place] she will need to find something local however most jobs will not allow you to have oxygen. I made her aware I would get this message to MR for recommendations. She reports she is already in the process of applying for disability.   MR please advise, if patient can return to work. Thanks.

## 2019-02-06 NOTE — Telephone Encounter (Signed)
LVMTCB x 1 for patient.  Message left in triage as an [place]

## 2019-02-06 NOTE — Progress Notes (Signed)
HPI Oc 2017  : Mrs. Bir is a 53 year old with past history of asthma. She has poor control over the past year and needed to go on several tapers of prednisone. She was seen in the ED in July 2017 for an exacerbation. She is currently maintained on Symbicort and albuterol. She needs to use her albuterol every day. She denies any nighttime awakenings. She also has significant issues with sinusitis, postnasal drip with constant clearing of the throat. She has heartburn and is on Prilosec.   Interim History Aug 2018: Seen in ED in July 2018 for dyspnea, cough, sinus and throat congestion. CTA did not show PE, there was mild atelectasis, GGO at base. PFTs do not show obstruction. There is restriction but no ILD on CT Today in the office she has chronic cough, no dyspnea or wheezing  She is in being evaluated for cytopenia. She's had a bone marrow biopsy which did not show any abnormality. She has a right upper quadrant ultrasound and a GI Eval Pending for Evaluation of Cirrhosis.   Interim History feb 2019: Admitted from 2/4-06/19/17 with fevers, diagnosed with flu.  Treated with Tamiflu.  She was seen by GI at that time for evaluation of cirrhosis and recommended outpatient follow-up with Dr. Carlean Purl. Hospitalized again from 2/19-2/24/19 with cellulitis, lower extremity pain.  Workup was negative for PE and DVT.  She was treated with Keflex.  CT showed stable mild groundglass opacities consistent with volume overload.    Reports that breathing is stable.  Still continues on Symbicort and albuterol.  Chief complaint today is left lower extremity pain.  There is no swelling, erythema, fevers, chills.     Interim History May 2019: Hospitalized again and early May for chest pain, dyspnea.  She had a stress test which is normal. Also seen by pulmonary for CTA that was negative for PE but showed some chronic lung opacities. Evaluated for aspiration.  Swallow eval was normal Serologies  shows positive ANA, SSA    Nov 2019: Mrs. Hickey is a 53 year old with past history of asthma, cirrhosis, cytopenia, restrictive lung disease. Seen in the ED in July 2017 for an exacerbation. She is currently maintained on Symbicort and albuterol. She needs to use her albuterol every day. She denies any nighttime awakenings. She also has significant issues with sinusitis, postnasal drip with constant clearing of the throat. She has heartburn and is on Prilosec.  Admitted from 2/4-06/19/17 with fevers, diagnosed with flu.  Treated with Tamiflu.  She was seen by GI at that time for evaluation of cirrhosis and recommended outpatient follow-up with Dr. Carlean Purl. Hospitalized 2/19-2/24/19 with cellulitis, lower extremity pain.  Workup was negative for PE and DVT.  She was treated with Keflex.  CT showed stable mild groundglass opacities consistent with volume overload.    Hospitalized again in May 2019 for chest pain, dyspnea.  She had a stress test which is normal. Also seen by pulmonary for CTA that was negative for PE but showed some chronic lung opacities. Evaluated for aspiration.  Swallow eval was normal Serologies shows positive ANA, SSA  She has seen Dr. Lebron Conners, hematology for cytopenia status post bone marrow biopsy.  Her low blood counts are felt to be secondary to cirrhosis.  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 Exposures: Had exposed to mold in the past.  No dampness, heart type, Jacuzzi Smoking history: Denies smoking.  She is exposed to secondhand smoke Travel history:  No significant travel history Relevant family history: No significant family history of lung disease.   Interim History: Nov 2019 Chronic cough continues with mild dyspnea on exertion. Referred to Dr. Chase Caller by Dr, Joya Gaskins, primary care for second opinion regarding interstitial lung disease.   OV 05/25/2018  Subjective:  Patient ID: Edilia Bo, female , DOB: May 20, 1965 ,  age 62 y.o. , MRN: [id number] , ADDRESS: [place] [address]   05/25/2018 -   Chief Complaint  Patient presents with   Follow-up    Pt states things have been worse for her since last visit. Pt has complaints of SOB which happens almost at any time, has an occ cough with white foamy mucus, and also has some complaints of CP.     HPI JAYANI ROZMAN 53 y.o. -referred by Dr. Asencion Noble and Dr. Marshell Garfinkel for interstitial lung disease.  Is a very complex history  Much of the work-up is already been done.  The time spent was in reviewing the chart and talking to the patient.  It appears she has idiopathic cirrhosis [reviewed and confirmed to 2014 or 2015 liver biopsy showing cirrhosis with active inflammation].  She does not follow-up with a GI doctor.  Her platelets run around 50-60000.  She sees Dr. Neldon Mc for allergy.  She used to be on asthma inhaler but following repeated normal nitric oxide her Symbicort was stopped maybe approximately over a year ago.  She is not sure if the cough is worse after that.    Her current problem seems to be dyspnea on exertion for the last few to several years progressively getting worse.  She also has constant chronic cough.  She is not on oxygen.  She does not recollect having her overnight oxygen desaturation tested.  It appears a few years ago her CT scans were fairly clear and suggestive of volume overload but with time the CT scans of change.  Most recent CT scan of the chest in late 2019 is reported as probable UIP based on craniocaudal gradient and some traction bronchiectasis without honeycombing.  The findings are early and mild.  I personally visualized the CT and agree with the findings.  Rest of the ILD question is all worked up she is got trace positive autoimmune antibodies.  Rheumatology referral is pending and an appointment is pending according to the patient.  The only PFTs from July 2018 which suggest  restriction.      IMPRESSION: 1. The appearance of the lungs is compatible with interstitial lung disease, with a spectrum of findings categorized as probable usual interstitial pneumonia (UIP) based on current ATS guidelines. Repeat high-resolution chest CT is suggested in 12 months to assess for temporal changes in the appearance of the lung parenchyma. 2. Aortic atherosclerosis. 3. Cirrhosis with stigmata of portal venous hypertension, including splenomegaly.  Aortic Atherosclerosis (ICD10-I70.0).   Electronically Signed   By: Vinnie Langton M.D.   On: 04/05/2018 18:24   Results for KALEAH, HAGEMEISTER (MRN [id number]) as of 05/25/2018 10:40  Ref. Range 10/06/2017 16:42  IgE (Immunoglobulin E), Serum Latest Ref Range: <OR=114 kU/L <2  ASPERGILLUS FUMIGATUS Latest Ref Range: NEGATIVE  NEGATIVE  [place] Serum Latest Ref Range: NEGATIVE  NEGATIVE  Anti Nuclear Antibody(ANA) Latest Ref Range: NEGATIVE  POSITIVE (A)  ANA Pattern 1 Unknown SPECKLED (A)  ANA Titer 1 Latest Units: titer 8:24 (H)  Cyclic Citrullin Peptide Ab Latest Units: UNITS <16  ds DNA Ab Latest Units: IU/mL 2  RA Latex Turbid. Latest Ref Range: <14 IU/mL <14  ENA SM Ab Ser-aCnc Latest Ref Range: <1.0 NEG AI <1.0 NEG  SSA (Ro) (ENA) Antibody, IgG Latest Ref Range: <1.0 NEG AI >8.0 POS (A)  SSB (La) (ENA) Antibody, IgG Latest Ref Range: <1.0 NEG AI <1.0 NEG  Scleroderma (Scl-70) (ENA) Antibody, IgG Latest Ref Range: <1.0 NEG AI <1.0 NEG  SM/RNP Latest Ref Range: <1.0 NEG AI <1.0 NEG    Cirrhosis labs - Results for SHAHARA, HARTSFIELD (MRN [id number]) as of 05/25/2018 12:00  Ref. Range 04/26/2018 23:47  Platelets Latest Ref Range: 150 - 400 K/uL 51 (L)    OV 07/12/2018  Subjective:  Patient ID: Edilia Bo, female , DOB: 1966-03-05 , age 10 y.o. , MRN: [id number] , ADDRESS: [place] [address]   07/12/2018 -   Chief Complaint  Patient presents with   Follow-up    PFT done today. She states  her breathing has been okay. She states she has good days and bad days but she makes sure to pace herself. Recenly seen in ED for URI.    Follow-up probable UIP on CT scan of the chest November 2019 with worsening compared to July 2018.  Female gender.  Positive ANA and SSA associated with clubbing and bilateral basal Velcro crackles -definition of ILD in progress.  Associated with cirrhosis and low platelets, bili 2.2 idiopathic variety  HPI MALA GIBBARD 53 y.o. -returns for follow-up of her ILD.  In the interim we discussed her case at the multidisciplinary case conference.  The consensus was that she did indeed have probable UIP on the CT scan [greater than 80% probability that this is indeed UIP].  Radiologist was concerned about progression between summer 2018 and the fall 2019 based on CT scan appearance.  In the interim overall she is stable although since seeing me she is having respiratory exacerbation has been treated with antibiotic and prednisone.  The prednisone is still continuing with tomorrow being the last day.  I reviewed the primary care physician chart and confirmed it.  She is beginning to feel close to baseline.  She had pulmonary function test today that shows very similar values compared to summer 2018.  Therefore in essence she is stable on PFTs compared to summer 2018 but the decline is on the CT scan.  Her most recent liver work shows a rising bilirubin.  She says she has not seen a hepatologist or GI specialist in a few years.  She is willing to undergo bronchoscopy to narrow down the specific variety of ILD.  We discussed her tolerance to sedation.  She does not have any sleep apnea.  Her last procedure was 3 years ago and 5 years ago .  During this time she has held well with sedation procedures.         OV 02/06/2019  Subjective:  Patient ID: [name], female , DOB: Jun 30, 1965 , age 75 y.o. , MRN: [id number] , ADDRESS: [address] [place] [address]   02/06/2019 -   Chief Complaint  Patient presents with   Follow-up    6 month rov.  c/o stable sob with exertion, a sometimes prod cough with clear mucus.      Follow-up probable UIP on CT scan of the chest November 2019 with worsening compared to July 2018.  Female gender.  Positive ANA and SSA associated with clubbing and bilateral basal Velcro crackles -.  Associated with  cirrhosis Hepatic cirrhosis [?  [place]] due to primary biliary cholangitis ([place]) and low platelets, and gastric varices with bleeding. BAL March 2020 with mixed ceullarity - 54% PMN nand 28% lymphs c/w IPF     HPI Adalina Dopson 53 y.o. -presents for follow-up of interstitial lung disease that is presumed IPF with high clinical pretest probability [other differential diagnosis is i.e. IPAF] since I last saw her in March 2020 when she had mixed cellularity in her BAL she has been diagnosed with confirmed hepatic cirrhosis with biliary cholangitis.  She is also had gastric varices.  She had an admission in summer 2020 for worsening acute hypoxemic respiratory failure.  Since then she is been on oxygen.  Today we turned the oxygen off and she is normoxic at rest but easily desaturates with walking.  This is significant deterioration.  She had a CT scan of the chest in July 2020 but there is motion artifact with this.  She had repeat pulmonary function test now and this is documented below.  The FVC is stable but her DLCO is deteriorated.  She is also had ascites for her cirrhosis.  She is under the management of Dr. Carlean Purl.  She still interested in the question of anti-fibrotic's but she has significant amount of dyspnea and cough as documented below.  The oxygen and nebulizers helped the cough only somewhat but she still has a lot of cough.  She is on Mucinex and [employer] but these are not helping the cough.  We had a conversation again that anti-fibrotic's risky in the presence of  cirrhosis.      SYMPTOM SCALE - ILD 02/06/2019   O2 use 2L Nexertion  Shortness of Breath 0 -> 5 scale with 5 being worst (score 6 If unable to do)  At rest 2  Simple tasks - showers, clothes change, eating, shaving 4  Household (dishes, doing bed, laundry)   Shopping 5  Walking level at own pace 5  Walking keeping up with others of same age 70  Walking up Stairs 5  Walking up [place] 5  Total (40 - 48) Dyspnea Score 35  How bad is your cough? 5  How bad is your fatigue On and off         Simple office walk 250 feet x  3 laps goal with forehead probe 05/25/2018  02/06/2019   O2 used Room air Room air  Number laps completed 3 Aimed 3 but stoppled 6 tims in 1 lap  Comments about pace slow   Resting Pulse Ox/HR 100% and 70/min 92% and 98/min  Final Pulse Ox/HR 94% and 92/min 86% and 117/min  Desaturated </= 88% no yes  Desaturated <= 3% points Yes, 6 points Yes, 8 points  Got Tachycardic >/= 90/min yes yes  Symptoms at end of test Mild dyspnea final lap dy  Miscellaneous comments x      Results for ZLATA, ALCAIDE "[name]" (MRN [id number]) as of 02/06/2019 10:25  Ref. Range 11/13/2016 11:21 07/12/2018 13:58 01/11/2019 10:08  FVC-Pre Latest Units: L 1.80 1.87 1.88  FVC-%Pred-Pre Latest Units: % 48 50 49  Results for GIZELLE, WHETSEL [name] "[name]" (MRN [id number]) as of 02/06/2019 10:25  Ref. Range 11/13/2016 11:21 07/12/2018 13:58 01/11/2019 10:08  DLCO unc Latest Units: ml/min/mmHg  14.68 11.45  DLCO unc % pred Latest Units: %  66 50    ROS - per HPI     has a past medical history of Acute respiratory failure with hypoxia (  [place]) (09/22/2018), Acute respiratory failure with hypoxia ([place]) (12/09/2018), Anxiety, Arthritis, Chronic kidney disease, Community acquired pneumonia (11/28/2018), Diabetes mellitus without complication ([place]), Edentulous, Epilepsy ([place]) (in 1972), Gastric varices with bleeding, GERD (gastroesophageal reflux disease) (2008), Headache(784.0), Hepatic cirrhosis due  to primary biliary cholangitis ([place]), History of blood transfusion, Hypothyroidism (2008), Interstitial lung disease ([place]), Leukopenia, Lower esophageal ring (08/18/2013), Pneumonia, Primary biliary cholangitis ([place]) (10/24/2018), Seasonal allergies (2003), SEIZURES, HX OF (12/12/2009), Shortness of breath, and Wears glasses.   reports that she quit smoking about 23 years ago. Her smoking use included cigarettes. She smoked 0.25 packs per day. She has never used smokeless tobacco.  Past Surgical History:  Procedure Laterality Date   ABDOMINAL HYSTERECTOMY     " partial "   BALLOON DILATION N/A 10/24/2013   Procedure: BALLOON DILATION;  Surgeon: Gatha Mayer, MD;  Location: [facility];  Service: Endoscopy;  Laterality: N/A;   BIOPSY  09/26/2018   Procedure: BIOPSY;  Surgeon: Thornton Park, MD;  Location: [facility];  Service: Gastroenterology;;   BIOPSY  01/09/2019   Procedure: BIOPSY;  Surgeon: Irving Copas., MD;  Location: [facility];  Service: Gastroenterology;;   CESAREAN SECTION     x2   DENTAL SURGERY     multiple extractions 3'15   DILATION AND CURETTAGE OF UTERUS     ESOPHAGOGASTRODUODENOSCOPY N/A 10/24/2013   Procedure: ESOPHAGOGASTRODUODENOSCOPY (EGD);  Surgeon: Gatha Mayer, MD;  Location: [facility];  Service: Endoscopy;  Laterality: N/A;   ESOPHAGOGASTRODUODENOSCOPY N/A 01/09/2019   Procedure: ESOPHAGOGASTRODUODENOSCOPY (EGD);  Surgeon: Irving Copas., MD;  Location: [facility];  Service: Gastroenterology;  Laterality: N/A;   ESOPHAGOGASTRODUODENOSCOPY (EGD) WITH PROPOFOL N/A 09/26/2018   Procedure: ESOPHAGOGASTRODUODENOSCOPY (EGD) WITH PROPOFOL;  Surgeon: Thornton Park, MD;  Location: [facility];  Service: Gastroenterology;  Laterality: N/A;   IR PARACENTESIS  09/23/2018   LIVER BIOPSY  06/30/2012   Procedure: LIVER BIOPSY;  Surgeon: Shann Medal, MD;  [facility];  Service: General;;   NOVASURE ABLATION     SHOULDER  ARTHROSCOPY Right [id number]   UMBILICAL HERNIA REPAIR N/A 06/30/2012   Procedure: remove umbilicus;  Surgeon: Shann Medal, MD;  [facility];  Service: General;  Laterality: N/A;   UPPER ESOPHAGEAL ENDOSCOPIC ULTRASOUND (EUS) N/A 01/09/2019   Procedure: UPPER ESOPHAGEAL ENDOSCOPIC ULTRASOUND (EUS);  Surgeon: Irving Copas., MD;  Location: [facility];  Service: Gastroenterology;  Laterality: N/A;   VENTRAL HERNIA REPAIR N/A 06/30/2012   Procedure: LAPAROSCOPIC VENTRAL HERNIA;  Surgeon: Shann Medal, MD;  [facility];  Service: General;  Laterality: N/A;  With Mesh   VIDEO BRONCHOSCOPY Bilateral 07/20/2018   Procedure: VIDEO BRONCHOSCOPY WITHOUT FLUORO;  Surgeon: Brand Males, MD;  [facility];  Service: Cardiopulmonary;  Laterality: Bilateral;   WISDOM TOOTH EXTRACTION      Allergies  Allergen Reactions   Aspirin Nausea Only    Upset stomach    Immunization History  Administered Date(s) Administered   Influenza-Unspecified 01/11/2019   Pneumococcal Polysaccharide-23 08/26/2011, 09/12/2017   Td 05/11/2006    Family History  Problem Relation Age of Onset   Hypertension Mother    Diabetes Mother    Cirrhosis Mother        ? medications   Lung cancer Father    Stroke Father    Diabetes Sister    Hypertension Sister    Colon cancer Neg Hx    Esophageal cancer Neg Hx    Rectal cancer Neg Hx  Current Outpatient Medications:    acetaminophen (TYLENOL) 500 MG tablet, Take 1,000 mg by mouth every 6 (six) hours as needed for mild pain., Disp: , Rfl:    albuterol (PROAIR HFA) 108 (90 Base) MCG/ACT inhaler, INHALE 2 PUFFS INTO THE LUNGS EVERY 6 HOURS AS NEEDED FOR SHORTNESS OF BREATH (Patient taking differently: Inhale 2 puffs into the lungs every 4 (four) hours as needed for wheezing or shortness of breath. ), Disp: 8.5 g, Rfl: 5   albuterol (PROVENTIL) (2.5 MG/3ML) 0.083% nebulizer solution, Take 3 mLs (2.5 mg total) by  nebulization every 6 (six) hours as needed for wheezing or shortness of breath., Disp: 75 mL, Rfl: 2   blood glucose meter kit and supplies, Dispense based on patient and insurance preference. Use up to four times daily as directed. (FOR ICD-10 E10.9, E11.9)., Disp: 1 each, Rfl: 0   cetirizine (ZYRTEC) 10 MG tablet, Take 1 tablet (10 mg total) by mouth daily., Disp: 30 tablet, Rfl: 11   fluticasone (FLONASE) 50 MCG/ACT nasal spray, Place 2 sprays into both nostrils daily., Disp: , Rfl:    glucose blood (TRUE METRIX BLOOD GLUCOSE TEST) test strip, Use as instructed, Disp: 100 each, Rfl: 12   guaiFENesin (MUCINEX) 600 MG 12 hr tablet, Take 600 mg by mouth 2 (two) times daily as needed for cough., Disp: , Rfl:    levothyroxine (SYNTHROID) 125 MCG tablet, Take 2 tablets (250 mcg total) by mouth daily., Disp: 60 tablet, Rfl: 0   metFORMIN (GLUCOPHAGE) 500 MG tablet, Take 1 tablet (500 mg total) by mouth 2 (two) times daily with a meal., Disp: 180 tablet, Rfl: 2   OXYGEN, Inhale into the lungs. Use sleeping and when out and about, Disp: , Rfl:    pantoprazole (PROTONIX) 40 MG tablet, Take 1 tablet (40 mg total) by mouth 2 (two) times daily before a meal., Disp: 60 tablet, Rfl: 3   SPIRIVA HANDIHALER 18 MCG inhalation capsule, INHALE 1 CAPSULE VIA HANDIHALER ONCE DAILY AT THE SAME TIME EVERY DAY, Disp: 30 capsule, Rfl: 0   TRUEplus Lancets 28G MISC, Use to check blood sugar as directed., Disp: 100 each, Rfl: 1   ursodiol (ACTIGALL) 500 MG tablet, Take 1 tablet (500 mg total) by mouth 3 (three) times daily., Disp: 270 tablet, Rfl: 3   budesonide-formoterol (SYMBICORT) 80-4.5 MCG/ACT inhaler, Inhale 2 puffs into the lungs 2 (two) times daily for 1 day., Disp: 1 Inhaler, Rfl: 0      Objective:   Vitals:   02/06/19 1016  BP: 124/66  Pulse: 82  SpO2: 97%  Weight: 241 lb 3.2 oz (109.4 kg)  Height: _0  (1.676 m)    Estimated body mass index is 38.93 kg/m as calculated from the  following:   Height as of this encounter: _1  (1.676 m).   Weight as of this encounter: 241 lb 3.2 oz (109.4 kg).  _2 @  [employer]   02/06/19 1016  Weight: 241 lb 3.2 oz (109.4 kg)     Physical Exam  General Appearance:    Alert, cooperative, no distress, appears stated age - older , Deconditioned looking - yes , OBESE  - yes, Sitting on Wheelchair -  no  Head:    Normocephalic, without obvious abnormality, atraumatic  Eyes:    PERRL, conjunctiva/corneas clear,  Ears:    Normal TM's and external ear canals, both ears  Nose:   Nares normal, septum midline, mucosa normal, no drainage    or sinus tenderness. OXYGEN ON  -  yes . Patient is @ 2L [place]   Throat: MASK.   Neck:   Supple, symmetrical, trachea midline, no adenopathy;    thyroid:  no enlargement/tenderness/nodules; no carotid   bruit or JVD  Back:     Symmetric, no curvature, ROM normal, no CVA tenderness  Lungs:     Distress - no , Wheeze no, Barrell Chest - no, Purse lip breathing - no, Crackles - yes   Chest Wall:    No tenderness or deformity. SPIDER NEVII +   Heart:    Regular rate and rhythm, S1 and S2 normal, no rub   or gallop, Murmur - no  Breast Exam:    NOT DONE  Abdomen:     Soft, non-tender, bowel sounds active all four quadrants,    no masses, no organomegaly. Visceral obesity - yes  Genitalia:   NOT DONE  Rectal:   NOT DONE  Extremities:   Extremities - normal, Has Cane - no, Clubbing - yes, Edema - no  Pulses:   2+ and symmetric all extremities  Skin:   Stigmata of Connective Tissue Disease - no  Lymph nodes:   Cervical, supraclavicular, and axillary nodes normal  Psychiatric:  Neurologic:   Pleasant - yes, Anxious - yes, Flat affect - yes  CAm-ICU - neg, Alert and Oriented x 3 - yes, Moves all 4s - yes, Speech - normal, Cognition - intact           Assessment:       ICD-10-CM   1. Chronic respiratory failure with hypoxia (HCC)  J96.11 Amb Referral to Clinical Pharmacist     Ambulatory Referral for DME  2. ILD (interstitial lung disease) ([place])  J84.9 Amb Referral to Clinical Pharmacist    Ambulatory Referral for DME       Plan:     Patient Instructions     ICD-10-CM   1. Chronic respiratory failure with hypoxia (HCC)  J96.11   2. ILD (interstitial lung disease) ([place])  J84.9     You have IPF type of Interstitiatl lung disease or another variety called IPAF due to autoimmune antibodies  Clinically  worse maybe since March 2020  Main issue is cough and shortness of breath  Plan  - due to liver cirrhosis will be risky to give you anti-fibrotics for your fibrosis  - supportive care is the best  - get new neb machine (understand your friend threw yours away)  - refill nebulizers  - use oxygen with exertion; 2L [place]   - meet with [facility] To get light portable system  - do you use oxygen at night with sleep? IF not we should test you for ONO test on room air at home  -  [facility] [place] [facility] our pharmacists to review liver safe medications and if ok to start tussionex for cough   Followup  - 3 months or sooner if needed    > 50% of this > 25 min visit spent in face to face counseling or coordination of care - by this undersigned MD - Dr Brand Males. This includes one or more of the following documented above: discussion of test results, diagnostic or treatment recommendations, prognosis, risks and benefits of management options, instructions, education, compliance or risk-factor reduction   SIGNATURE    Dr. Brand Males, M.D., F.C.C.P,  Pulmonary and Critical Care Medicine Staff Physician, [place] Director - Interstitial Lung Disease  Program  Pulmonary [place] [facility]  [place], [place], [address]  Pager: [phone], If no answer or between  15:00h - 7:00h: call [phone] Telephone: [phone]  10:59 AM 02/06/2019

## 2019-02-07 ENCOUNTER — Telehealth: Payer: Self-pay | Admitting: Internal Medicine

## 2019-02-07 NOTE — Telephone Encounter (Signed)
Patient is returning phone call.  Patient phone number is [phone].

## 2019-02-07 NOTE — Telephone Encounter (Signed)
Call returned to [facility], I made her aware that this patient had a qualifying walk on 09/29 that is documented in epic. She reports she will get back with her team and if anything further is needed she will give [place] a call back. Nothing further is needed at this time.

## 2019-02-07 NOTE — Telephone Encounter (Signed)
Called the patient and made her aware of Dr. Golden Pop response. Patient stated she will contact the attorney to ler her know of the recommendation. Patient stated her attorney may forward forms that will need to be filled out.  Nothing further needed at this time.

## 2019-02-07 NOTE — Telephone Encounter (Signed)
ATC Patient.  LM to call back when available.

## 2019-02-08 ENCOUNTER — Telehealth: Payer: Self-pay | Admitting: Internal Medicine

## 2019-02-08 NOTE — Telephone Encounter (Signed)
Spoke with patient. She was calling in reference to the call on 9/28 about her disability. She spoke with her attorney about MR recommendations. Her attorney, Adin Hector, is requesting to have this information typed in a letter and sent to her. The patient is requesting to have a copy of the letter mailed to her. Verified her address.  Her attorney's fax number is [phone].  Below is the response from MR from 9/28:   "She needs to go on permanent disability Till then any work that let her sit and do or that will allow exertion but with o2 on she can do Cannot get exposed to cigarrettes during this time"  MR, please advise if you are ok with [place] typing this into a letter for patient. Thanks!

## 2019-02-08 NOTE — Telephone Encounter (Signed)
Pt calling back about Dr. Chase Caller writing letter for pt to go on disability to her attorney and mail a copy to patient.  [phone].

## 2019-02-09 NOTE — Telephone Encounter (Signed)
Dr. Chase Caller is back in office on 02/10/19 will route to his nurse to follow up on   Erica please see that MR addresses this Friday

## 2019-02-10 ENCOUNTER — Encounter: Payer: Self-pay | Admitting: Family Medicine

## 2019-02-10 ENCOUNTER — Ambulatory Visit (INDEPENDENT_AMBULATORY_CARE_PROVIDER_SITE_OTHER): Payer: Self-pay | Admitting: Family Medicine

## 2019-02-10 ENCOUNTER — Other Ambulatory Visit: Payer: Self-pay

## 2019-02-10 ENCOUNTER — Telehealth: Payer: Self-pay | Admitting: Internal Medicine

## 2019-02-10 VITALS — BP 108/70 | HR 76 | Ht 66.0 in | Wt 245.1 lb

## 2019-02-10 DIAGNOSIS — L304 Erythema intertrigo: Secondary | ICD-10-CM

## 2019-02-10 DIAGNOSIS — J9611 Chronic respiratory failure with hypoxia: Secondary | ICD-10-CM

## 2019-02-10 DIAGNOSIS — R0902 Hypoxemia: Secondary | ICD-10-CM

## 2019-02-10 DIAGNOSIS — R0602 Shortness of breath: Secondary | ICD-10-CM

## 2019-02-10 DIAGNOSIS — J849 Interstitial pulmonary disease, unspecified: Secondary | ICD-10-CM

## 2019-02-10 DIAGNOSIS — J4520 Mild intermittent asthma, uncomplicated: Secondary | ICD-10-CM

## 2019-02-10 DIAGNOSIS — R350 Frequency of micturition: Secondary | ICD-10-CM

## 2019-02-10 DIAGNOSIS — R1030 Lower abdominal pain, unspecified: Secondary | ICD-10-CM

## 2019-02-10 DIAGNOSIS — R300 Dysuria: Secondary | ICD-10-CM

## 2019-02-10 LAB — POCT UA - MICROSCOPIC ONLY

## 2019-02-10 LAB — POCT URINALYSIS DIP (MANUAL ENTRY)
Bilirubin, UA: NEGATIVE
Glucose, UA: NEGATIVE mg/dL
Ketones, POC UA: NEGATIVE mg/dL
Leukocytes, UA: NEGATIVE
Nitrite, UA: NEGATIVE
Protein Ur, POC: NEGATIVE mg/dL
Spec Grav, UA: 1.02 (ref 1.010–1.025)
Urobilinogen, UA: >=8 E.U./dL — AB
pH, UA: 6.5 (ref 5.0–8.0)

## 2019-02-10 MED ORDER — TAMSULOSIN HCL 0.4 MG PO CAPS: 0.4000 mg | ORAL_CAPSULE | Freq: Every day | ORAL | 3 refills | Status: DC

## 2019-02-10 MED ORDER — KETOCONAZOLE 2 % EX CREA: 1.0000 "application " | TOPICAL_CREAM | Freq: Every day | CUTANEOUS | 0 refills | Status: DC

## 2019-02-10 MED FILL — KETOCONAZOLE 2% CREAM: 2 | 15 days supply | Qty: 15 | Fill #0

## 2019-02-10 MED FILL — TAMSULOSIN HCL 0.4 MG CAP: 0.4 | 30 days supply | Qty: 30 | Fill #0

## 2019-02-10 NOTE — Telephone Encounter (Signed)
Order placed for nebulizer per AVS from 09/28.   LMTCB.

## 2019-02-10 NOTE — Progress Notes (Signed)
   [facility] Phone: [phone]     Holly Mueller - 53 y.o. female MRN [id number]  Date of birth: [birth year]  Subjective:   cc: rash, frequent urination  HPI:  Rash: arm rash has been there for about one week.  They don't itch or hurt.  First time she's noticed it.  On both arms, near axilla.   On leg: she has noticed in her inguinal area a rash for years.  Just this year it has spread laterally.  Does not itch or hurt.  Uses lotion every day when she takes a shower or bath.  Cocoa butter. Hasn't been exposed to insect bites.  Lives with sister who has dog and cat.  Haven't bitten her.     Frequent urination/dysuria:  whe shes sitting she gets pain in her groing that goes to her lower back.  Tylenol doesn't help. This is the third time in less than two months.  It goes away . This time has been 8 days.  Pain comes in waves.  Curls up in a ball when it is worst.  Has been drinking water and cranberry juice.  No discharge or pruritus.  She has been using a otc medication for yeast infection.  Starts with 'M'.  Doesn't help for pain.  Hasn't had period in 15-20 years.    ROS: See HPI for pertinent positives and negatives  Past Medical History  Family history reviewed for today's visit. No changes.   Objective:   BP 108/70   Pulse 76   Ht _0  (1.676 m)   Wt 245 lb 2 oz (111.2 kg)   LMP  (LMP Unknown)   SpO2 97%   BMI 39.56 kg/m  Gen: NAD, alert and oriented, cooperative with exam CV: normal rate, regular rhythm. Systolic 2/6 murmur.  Resp: LCTAB, no wheezes, crackles. normal work of breathing GI: nontender to palpation, BS present, no guarding or organomegaly Skin: light pink rash under arms near axillae.  Psych: Appropriate behavior        Assessment/Plan:   Intertrigo Light rash under arms/near axillae for past week, inguinal rash under pannus that is much older.  No pruritus. Or pain  Most likely represents intertrigo given location  and appearance.  -ketoconazole  2% cream - f/u with pcp in 1 month.   Dysuria Colicky lower abd pain.  Has occurred 3 tims in last two months. This episode lasting 8 days. Urinalysis only shows urobilinogen and hematuria.  No sign of uti.  Likely 2/2 renal stones.   - ct stone study - tamsulosin - f/u with results of ct.  If stone, consider prescribing opioid for pain.    Clemetine Marker, MD PGY-2 [facility]

## 2019-02-10 NOTE — Telephone Encounter (Signed)
I did not get a chance to bring this to your attention while you were here in the office today. Can you at all if possible write the letter. I apologize that I didn't mention this today.

## 2019-02-10 NOTE — Patient Instructions (Addendum)
I think your rash is caused by something called intertrigo.  I have prescribed a cream to put on it daily.  I would like you to follow up in one month with your PCP to see if this has improved.  Put the cream on your arms and groin.  Try to keep those areas dry otherwise.  Please don't use the cocoa butter for now until your pcp can check your rash out again in a month.    I think you have kidney stones causing your pain.  You also have blood in your urine which is common with stones.  I would llike you to get a renal stone study CT scan.  We will schedule the appointment for you before you leave.  Please take this medication called tamsulosin or flomax daily for the next two weeks.  I will call you with the results of your ct scan when I get them   Have a great day,  Clemetine Marker, MD

## 2019-02-11 DIAGNOSIS — L304 Erythema intertrigo: Secondary | ICD-10-CM | POA: Insufficient documentation

## 2019-02-11 DIAGNOSIS — R300 Dysuria: Secondary | ICD-10-CM | POA: Insufficient documentation

## 2019-02-11 NOTE — Assessment & Plan Note (Signed)
Light rash under arms/near axillae for past week, inguinal rash under pannus that is much older.  No pruritus. Or pain  Most likely represents intertrigo given location and appearance.  -ketoconazole  2% cream - f/u with pcp in 1 month.

## 2019-02-11 NOTE — Assessment & Plan Note (Signed)
Colicky lower abd pain.  Has occurred 3 tims in last two months. This episode lasting 8 days. Urinalysis only shows urobilinogen and hematuria.  No sign of uti.  Likely 2/2 renal stones.   - ct stone study - tamsulosin - f/u with results of ct.  If stone, consider prescribing opioid for pain.

## 2019-02-13 ENCOUNTER — Telehealth: Payer: Self-pay | Admitting: Internal Medicine

## 2019-02-13 NOTE — Telephone Encounter (Signed)
Found a diagnosis of asthma from 2018 in patient's chart. Will use this DX to see if the nebulizer will get covered.

## 2019-02-13 NOTE — Telephone Encounter (Signed)
New order placed with both SOB and hypoxemia as dx  LMTCB to update the pt

## 2019-02-13 NOTE — Telephone Encounter (Signed)
LMTCB

## 2019-02-13 NOTE — Telephone Encounter (Signed)
Pt returning call.  [phone]

## 2019-02-13 NOTE — Telephone Encounter (Signed)
OK- neb ordered but ILD and Restrictive Lung Dz not covered dx  Holly Mueller- please advise what other dx we could try and advise if we can place a new order in your name  Maybe we could use SOB?  Please advise, thanks

## 2019-02-13 NOTE — Telephone Encounter (Signed)
I spoke with [name] and she wants to know if we got approval for the glue. I will check with Dr Carlean Purl about this. She is aware he is off today.

## 2019-02-13 NOTE — Telephone Encounter (Signed)
[place], Holly Mueller, [name], [place]; [place], Jeanie Cooks, Jennifer L        Those new dx's are symptoms not actual diagnosis's. It would have to be Chronic Asthma, Chronic Bronchitis, Emphysema, COPD.

## 2019-02-13 NOTE — Telephone Encounter (Signed)
I received this message from [place], Mariann Barter, Valentina Lucks, [name], [place]; Elon Alas        I called to leave a staff message but was on hold for 10 minutes   Previous Messages  ----- Message -----  From: Mariann Barter  Sent: 02/13/2019 10:21 AM EDT  To: Darlina Guys, Elon Alas, *  Subject: RE: neb machine and supplies           She doesn't have a qualifying dx for a nebulizer machine. J98.4, nor J84.9 will work.

## 2019-02-13 NOTE — Telephone Encounter (Signed)
Please try SOB or dyspnea  If these do not work please try hypoxemia. Thanks

## 2019-02-13 NOTE — Telephone Encounter (Signed)
LMTCB x2 for pt 

## 2019-02-13 NOTE — Telephone Encounter (Signed)
To Whom It May Concern  Holly Mueller with date of birth 15-Nov-1965 and address [address] [place] [address] is a patient of mine.  She is 53 years old.  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  The disease is severe.  The natural course of this disease her life expectancy is only few to several years.  Since 2014 there are anti-fibrotic therapies available but these are weak and a preventive in nature and that they can prevent the disease from progressing.  However the therapeutic potential is extremely weak and a full of side effects.  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  She is had bleeding because of this.  In my professional opinion the combination of these 2 diseases makes her life expectancy limited.  Also her current functional status will preclude her from having occupation such as the one she is having right now.  I strongly recommend permanent disability    Please do not hesitate to contact me at this office if you have any questions.  The phone number is [phone]   Sincerely yours  Dr. Chase Caller

## 2019-02-13 NOTE — Telephone Encounter (Signed)
Looks like order for neb still waiting on signature from Dr Chase Caller from 02/06/2019  Judson Roch, can we place under you name so this gets signed today?, thanks!

## 2019-02-14 ENCOUNTER — Encounter: Payer: Self-pay | Admitting: *Deleted

## 2019-02-14 NOTE — Telephone Encounter (Signed)
Letter has been written per MR. This has been faxed to the pt's attorney. Pt is aware of this. Nothing further was needed at this time.

## 2019-02-14 NOTE — Telephone Encounter (Signed)
Pt returning phone call ° °

## 2019-02-14 NOTE — Telephone Encounter (Signed)
We are working on it just not there yet  I am trying to get it done here so she does not have to go to [place] and I think we will be able to - please reassure her that her bleeding risk is lowered but the treatment she had already - we just want to finish the job

## 2019-02-14 NOTE — Telephone Encounter (Signed)
Patient informed and reassured.

## 2019-02-14 NOTE — Telephone Encounter (Signed)
Spoke with pt. She is aware that this letter has already been taken care. This is a duplicate message.

## 2019-02-21 ENCOUNTER — Telehealth: Payer: Self-pay | Admitting: Internal Medicine

## 2019-02-21 NOTE — Telephone Encounter (Signed)
Spoke with patient. She was calling to check on the status of her disability letter. Advised her that based on chart, the letter was sent out on October 6th. She stated that she has not received a copy of this letter. Advised her that I would place another copy in the mail for her today.  Verified her address.    Nothing further needed at time of call.

## 2019-03-01 ENCOUNTER — Telehealth: Payer: Self-pay | Admitting: Internal Medicine

## 2019-03-01 NOTE — Telephone Encounter (Signed)
Patient wants letter mailed.  Verified address which is correct in her record.  [phone].

## 2019-03-01 NOTE — Telephone Encounter (Signed)
LMTCB  Letter has been mailed twice. We can mail again or patient can come by office and pick up. Will verify address once patient calls back.

## 2019-03-01 NOTE — Telephone Encounter (Signed)
Spoke with the pt and again verified the address and mailed to her

## 2019-03-09 ENCOUNTER — Telehealth: Payer: Self-pay | Admitting: Internal Medicine

## 2019-03-09 ENCOUNTER — Encounter: Payer: Self-pay | Admitting: Adult Health

## 2019-03-09 ENCOUNTER — Telehealth: Payer: Self-pay | Admitting: Adult Health

## 2019-03-09 ENCOUNTER — Ambulatory Visit (INDEPENDENT_AMBULATORY_CARE_PROVIDER_SITE_OTHER): Payer: Medicaid Other | Admitting: Adult Health

## 2019-03-09 DIAGNOSIS — J209 Acute bronchitis, unspecified: Secondary | ICD-10-CM

## 2019-03-09 DIAGNOSIS — J849 Interstitial pulmonary disease, unspecified: Secondary | ICD-10-CM

## 2019-03-09 DIAGNOSIS — J9611 Chronic respiratory failure with hypoxia: Secondary | ICD-10-CM

## 2019-03-09 MED ORDER — DOXYCYCLINE HYCLATE 100 MG PO TABS: 100.0000 mg | ORAL_TABLET | Freq: Two times a day (BID) | ORAL | 0 refills | Status: DC

## 2019-03-09 NOTE — Telephone Encounter (Signed)
Spoke with patient.  She states she is still shortness of breath, extreme fatigue, legs feel heavy, chest congestion, coughing all the time,hard to sleep at night, can't catch breath. She is wearing her O2 at night and still can't catch her breath.  I told patient to call EMS if got work before televisit with TP at 1530. Patient voiced understanding.

## 2019-03-09 NOTE — Progress Notes (Signed)
Virtual Visit via Telephone Note  I connected with [name] on 03/09/19 at  3:30 PM EDT by telephone and verified that I am speaking with the correct person using two identifiers.  Location: Patient: Home  Provider: Office    I discussed the limitations, risks, security and privacy concerns of performing an evaluation and management service by telephone and the availability of in person appointments. I also discussed with the patient that there may be a patient responsible charge related to this service. The patient expressed understanding and agreed to proceed.   History of Present Illness: 53 yo female never smoker for ILD and oxygen dependent respiratory failure.  Medical history significant for cirrhosis , varices    Today is a televisit for an acute office visit for cough.  Patient complains of 1 to 2 weeks of increased cough congestion with thick mucus.  Mucus has been yellow to light green.  She denies any fever, chest pain, orthopnea, sore throat, body aches, loss of sense of taste or smell. Has been using over-the-counter cold medicines without much relief. She remains on oxygen 2 L.  Has had no increased oxygen demands. Appetite is good with no nausea vomiting diarrhea.  Patient denies any hemoptysis or chest pain.   Observations/Objective: Speaks in full sentences without any obvious coughing or wheezing.  Assessment and Plan: Acute bronchitis-  ILD - appears stable with no Oxygen demands or change in activity tolerance   Chronic Resp Failure -Oxygen dependent   Plan  Patient Instructions  Doxycycline 100 mg twice daily for 1 week Mucinex DM twice daily as needed for cough and congestion Continue on Symbicort 2 puffs twice daily Continue on Spiriva daily Fluids and rest Continue on oxygen 2 L Follow with Dr. Chase Caller in 6 weeks and as needed Please contact office for sooner follow up if symptoms do not improve or worsen or seek emergency care        Follow Up Instructions: Follow up with Dr. Chase Caller in 6 weeks and As needed     I discussed the assessment and treatment plan with the patient. The patient was provided an opportunity to ask questions and all were answered. The patient agreed with the plan and demonstrated an understanding of the instructions.   The patient was advised to call back or seek an in-person evaluation if the symptoms worsen or if the condition fails to improve as anticipated.  I provided 22 minutes of non-face-to-face time during this encounter.   Rexene Edison, NP

## 2019-03-09 NOTE — Patient Instructions (Signed)
Doxycycline 100 mg twice daily for 1 week Mucinex DM twice daily as needed for cough and congestion Continue on Symbicort 2 puffs twice daily Continue on Spiriva daily Fluids and rest Continue on oxygen 2 L Follow with Dr. Chase Caller in 6 weeks and as needed Please contact office for sooner follow up if symptoms do not improve or worsen or seek emergency care

## 2019-03-09 NOTE — Telephone Encounter (Signed)
LMOM TCB x1 to schedule 6 week follow up with MR per 03/09/19 visit with TP.

## 2019-03-10 ENCOUNTER — Telehealth: Payer: Self-pay

## 2019-03-10 ENCOUNTER — Other Ambulatory Visit: Payer: Self-pay

## 2019-03-10 DIAGNOSIS — I864 Gastric varices: Secondary | ICD-10-CM

## 2019-03-10 NOTE — Telephone Encounter (Signed)
Patty, thank you for the update. Ed Blalock for date of procedure. Cassandria Anger for date of procedure so that you can speak with the nursing team to go over cyanoacrylate. Thanks to all. GM

## 2019-03-10 NOTE — Telephone Encounter (Signed)
-----  Message from Irving Copas., MD sent at 03/10/2019  3:17 AM EDT ----- Regarding: Follow up Tor Tsuda,Please move forward with scheduling the patient's EGD/EUS with cyanoacrylate glue next available most likely in December.   Set up for 90 minutes.Once a date is known please let Dr. Carlean Purl and I know because we need to coordinate with our nursing team to get everything set up for this new procedure.Thanks.GM

## 2019-03-10 NOTE — Telephone Encounter (Signed)
Called spoke with patient - appt scheduled for 11.30.2020 w/ MR @ 0915.  Nothing further needed; will sign off.

## 2019-03-10 NOTE — Telephone Encounter (Signed)
Appt made for 04/26/19 for EUS/EGD at [facility].  Covid testing on 12/12 at 10 am.  Left message on machine to call back Dr Carlean Purl and Dr Vevelyn Francois.

## 2019-03-13 NOTE — Telephone Encounter (Signed)
EUS scheduled, pt instructed and medications reviewed.  Patient instructions mailed to home.  Patient to call with any questions or concerns. COVID testing information also given and verbalized understanding.  Information mailed to the pt home per request

## 2019-03-13 NOTE — Telephone Encounter (Signed)
Left message on machine to call back  

## 2019-03-14 ENCOUNTER — Telehealth: Payer: Self-pay | Admitting: Internal Medicine

## 2019-03-14 NOTE — Telephone Encounter (Signed)
Patient reports that she has multiple bruising to her abdomen, legs and axillary area.  She has had no trauma.  She reports abdominal bruises have been there for a while, but she notes lately that there are more of them. She is asking if she needs to come here to be evaluated or her PCP ?

## 2019-03-15 NOTE — Telephone Encounter (Signed)
Patient notified she can come this Friday. Due to transportation she can't come for labs prior.

## 2019-03-15 NOTE — Telephone Encounter (Signed)
If she can get labs  CBC, CMET, INR dx cirrhosis that would be best start but she sometimes has transportation problems and 2 trips might be an issue  We can add her on at 1130 Fri 11/6 if that works for her and I will order labs   Or Tues 11/10 at 1130   Can get labs after the visit in that case  So if she cannot make those days do labs only to start instead and if she can do labs in advance better yet

## 2019-03-15 NOTE — Telephone Encounter (Signed)
Left message for patient to call back  

## 2019-03-17 ENCOUNTER — Other Ambulatory Visit (INDEPENDENT_AMBULATORY_CARE_PROVIDER_SITE_OTHER): Payer: Medicaid Other

## 2019-03-17 ENCOUNTER — Ambulatory Visit (INDEPENDENT_AMBULATORY_CARE_PROVIDER_SITE_OTHER): Payer: Self-pay | Admitting: Internal Medicine

## 2019-03-17 ENCOUNTER — Encounter: Payer: Self-pay | Admitting: Internal Medicine

## 2019-03-17 VITALS — BP 132/77 | HR 73 | Temp 97.0°F | Ht 66.0 in | Wt 252.0 lb

## 2019-03-17 DIAGNOSIS — K743 Primary biliary cirrhosis: Secondary | ICD-10-CM

## 2019-03-17 DIAGNOSIS — R21 Rash and other nonspecific skin eruption: Secondary | ICD-10-CM | POA: Insufficient documentation

## 2019-03-17 DIAGNOSIS — I864 Gastric varices: Secondary | ICD-10-CM

## 2019-03-17 DIAGNOSIS — R198 Other specified symptoms and signs involving the digestive system and abdomen: Secondary | ICD-10-CM

## 2019-03-17 LAB — COMPREHENSIVE METABOLIC PANEL
ALT: 20 U/L (ref 0–35)
AST: 44 U/L — ABNORMAL HIGH (ref 0–37)
Albumin: 2.9 g/dL — ABNORMAL LOW (ref 3.5–5.2)
Alkaline Phosphatase: 163 U/L — ABNORMAL HIGH (ref 39–117)
BUN: 8 mg/dL (ref 6–23)
CO2: 24 mEq/L (ref 19–32)
Calcium: 8.9 mg/dL (ref 8.4–10.5)
Chloride: 106 mEq/L (ref 96–112)
Creatinine, Ser: 0.59 mg/dL (ref 0.40–1.20)
GFR: 106.2 mL/min (ref >60.00)
Glucose, Bld: 104 mg/dL — ABNORMAL HIGH (ref 70–99)
Potassium: 4 mEq/L (ref 3.5–5.1)
Sodium: 136 mEq/L (ref 135–145)
Total Bilirubin: 1.9 mg/dL — ABNORMAL HIGH (ref 0.2–1.2)
Total Protein: 7.4 g/dL (ref 6.0–8.3)

## 2019-03-17 LAB — PROTIME-INR
INR: 1.4 ratio — ABNORMAL HIGH (ref 0.8–1.0)
Prothrombin Time: 16.1 s — ABNORMAL HIGH (ref 9.6–13.1)

## 2019-03-17 LAB — CBC WITH DIFFERENTIAL/PLATELET
Basophils Absolute: 0 10*3/uL (ref 0.0–0.1)
Basophils Relative: 2.4 % (ref 0.0–3.0)
Eosinophils Absolute: 0.1 10*3/uL (ref 0.0–0.7)
Eosinophils Relative: 6.8 % — ABNORMAL HIGH (ref 0.0–5.0)
HCT: 35.2 % — ABNORMAL LOW (ref 36.0–46.0)
Hemoglobin: 11.8 g/dL — ABNORMAL LOW (ref 12.0–15.0)
Lymphocytes Relative: 19.6 % (ref 12.0–46.0)
Lymphs Abs: 0.3 10*3/uL — ABNORMAL LOW (ref 0.7–4.0)
MCHC: 33.5 g/dL (ref 30.0–36.0)
MCV: 88.6 fl (ref 78.0–100.0)
Monocytes Absolute: 0.2 10*3/uL (ref 0.1–1.0)
Monocytes Relative: 12.1 % — ABNORMAL HIGH (ref 3.0–12.0)
Neutro Abs: 1 10*3/uL — ABNORMAL LOW (ref 1.4–7.7)
Neutrophils Relative %: 59.1 % (ref 43.0–77.0)
Platelets: 67 10*3/uL — ABNORMAL LOW (ref 150.0–400.0)
RBC: 3.97 Mil/uL (ref 3.87–5.11)
RDW: 15.2 % (ref 11.5–15.5)
WBC: 1.6 10*3/uL — CL (ref 4.0–10.5)

## 2019-03-17 NOTE — Assessment & Plan Note (Addendum)
cyanoacrylate injection and ablation planned for December to complete.  She has not been having any signs of bleeding.

## 2019-03-17 NOTE — Assessment & Plan Note (Signed)
I am not sure what this is it did not respond to Nizoral, I initially thought it might be a yeast or fungal dermatophyte.  Fortunately it is asymptomatic.  I do not think it is bruising either.  I will get her labs and think about this.  See what primary care has to offer.  Could need dermatology.  Question if it could be some type of a drug eruption though its not classic for that.

## 2019-03-17 NOTE — Assessment & Plan Note (Addendum)
Orders Placed This Encounter  Procedures  . US Abdomen Complete  . CBC w/Diff  . Comprehensive metabolic panel  . INR/PT   This seems stable though I am suspicious that she has ascites check ultrasound continue ursodeoxycholic acid

## 2019-03-17 NOTE — Patient Instructions (Signed)
Your provider has requested that you go to the basement level for lab work before leaving today. Press "B" on the elevator. The lab is located at the first door on the left as you exit the elevator.   You have been scheduled for an abdominal ultrasound at [facility] (1st floor of hospital) on 03/24/2019 at 9:00AM. Please arrive 15 minutes prior to your appointment for registration. Make certain not to have anything to eat or drink 6 hours prior to your appointment. Should you need to reschedule your appointment, please contact radiology at [phone]. This test typically takes about 30 minutes to perform.   I appreciate the opportunity to care for you. Silvano Rusk, MD, [facility]

## 2019-03-17 NOTE — Progress Notes (Signed)
Holly Mueller 53 y.o. 1966-05-03 [id number]  Assessment & Plan:   Encounter Diagnoses  Name Primary?  . 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 Gastric varices with bleeding   . Skin rash   . Increased abdominal girth   . 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 US Abdomen Complete  . CBC w/Diff  . Comprehensive metabolic panel  . INR/PT   This seems stable though I am suspicious that she has ascites check ultrasound continue ursodeoxycholic acid   Gastric varices with bleeding cyanoacrylate injection and ablation planned for December to complete.  She has not been having any signs of bleeding.  Primary biliary cholangitis (HCC) Continue ursodiol  Skin rash I am not sure what this is it did not respond to Nizoral, I initially thought it might be a yeast or fungal dermatophyte.  Fortunately it is asymptomatic.  I do not think it is bruising either.  I will get her labs and think about this.  See what primary care has to offer.  Could need dermatology.  Question if it could be some type of a drug eruption though its not classic for that.    CC: Meccariello, Bernita Raisin, DO   Subjective:   Chief Complaint: Bruising?  Bloated weight gain  HPI Holly Mueller is here after she called the office saying she was having bruising on her body.  On her abdominal wall.  She had seen primary care and had some changes in discoloration in the intertriginous areas in her groin and was prescribed ketoconazole cream but it made no difference.  Now she is having this on her abdominal wall and in the axillary areas.  It is completely asymptomatic but a change for her.  She is also noted her weight is going up abdominal girth going up.  She does limit her salt intake.  She feels like she has been eating more with the pandemic restrictions.  No abdominal pain or fever.   She had her flu shot.  She does not have any lower extremity edema.  She is moving back to [place] which will help with transportation issues.  She has received approval for Medicaid.  She has a primary care appointment later this month.  She will follow-up with pulmonary later this month and she will have additional cyanoacrylate ablation of her gastric varices in December.  Wt Readings from Last 3 Encounters:  03/17/19 252 lb (114.3 kg)  02/10/19 245 lb 2 oz (111.2 kg)  02/06/19 241 lb 3.2 oz (109.4 kg)    Allergies  Allergen Reactions  . Aspirin Nausea Only    Upset stomach   Current Meds  Medication Sig  . acetaminophen (TYLENOL) 500 MG tablet Take 1,000 mg by mouth every 6 (six) hours as needed for mild pain.  Marland Kitchen albuterol (PROAIR HFA) 108 (90 Base) MCG/ACT inhaler INHALE 2 PUFFS INTO THE LUNGS EVERY 6 HOURS AS NEEDED FOR SHORTNESS OF BREATH (Patient taking differently: Inhale 2 puffs into the lungs every 4 (four) hours as needed for wheezing or shortness of breath. )  . albuterol (PROVENTIL) (2.5 MG/3ML) 0.083% nebulizer solution Take 3 mLs (2.5 mg total) by nebulization every 6 (six) hours as needed for wheezing or shortness of breath.  . blood glucose meter kit and supplies Dispense based on patient and insurance preference. Use up to four times daily as directed. (FOR ICD-10 E10.9,  E11.9).  . budesonide-formoterol (SYMBICORT) 80-4.5 MCG/ACT inhaler Inhale 2 puffs into the lungs 2 (two) times daily for 1 day.  . cetirizine (ZYRTEC) 10 MG tablet Take 1 tablet (10 mg total) by mouth daily.  Marland Kitchen doxycycline (VIBRA-TABS) 100 MG tablet Take 1 tablet (100 mg total) by mouth 2 (two) times daily.  . fluticasone (FLONASE) 50 MCG/ACT nasal spray Place 2 sprays into both nostrils daily.  Marland Kitchen glucose blood (TRUE METRIX BLOOD GLUCOSE TEST) test strip Use as instructed  . guaiFENesin (MUCINEX) 600 MG 12 hr tablet Take 600 mg by mouth 2 (two) times daily as needed for cough.  Marland Kitchen ketoconazole (NIZORAL)  2 % cream Apply 1 application topically daily.  Marland Kitchen levothyroxine (SYNTHROID) 125 MCG tablet Take 2 tablets (250 mcg total) by mouth daily.  . metFORMIN (GLUCOPHAGE) 500 MG tablet Take 1 tablet (500 mg total) by mouth 2 (two) times daily with a meal.  . OXYGEN Inhale into the lungs. Use sleeping and when out and about  . pantoprazole (PROTONIX) 40 MG tablet Take 1 tablet (40 mg total) by mouth 2 (two) times daily before a meal.  . SPIRIVA HANDIHALER 18 MCG inhalation capsule INHALE 1 CAPSULE VIA HANDIHALER ONCE DAILY AT THE SAME TIME EVERY DAY  . tamsulosin (FLOMAX) 0.4 MG CAPS capsule Take 1 capsule (0.4 mg total) by mouth daily.  . TRUEplus Lancets 28G MISC Use to check blood sugar as directed.  . ursodiol (ACTIGALL) 500 MG tablet Take 1 tablet (500 mg total) by mouth 3 (three) times daily.   Past Medical History:  Diagnosis Date  . Acute respiratory failure with hypoxia ([place]) 09/22/2018  . Anxiety    "anxiety attacks- sometimes"  . Arthritis    bursitis left hip flares-not an issue  . Chronic kidney disease   . Community acquired pneumonia 11/28/2018  . Diabetes mellitus without complication ([place])    Type II  . Edentulous    10-19-13 at present  . Epilepsy ([place]) in 1972   No seizures since 1972. Previously treated with phenobarbital.   . Gastric varices with bleeding   . GERD (gastroesophageal reflux disease) 2008  . Headache(784.0)    hx of migraines   . 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 History of blood transfusion    gi bleed  . Hypothyroidism 2008  . Interstitial lung disease ([place])   . Leukopenia   . Lower esophageal ring 08/18/2013  . Pneumonia   . Primary biliary cholangitis ([place]) 10/24/2018  . Seasonal allergies 2003  . SEIZURES, HX OF 12/12/2009   Annotation: last seizure in early 1970s Qualifier: Diagnosis of  By: Carlena Sax  MD, Colletta Maryland    . Shortness of breath   . Wears glasses    Past Surgical History:  Procedure Laterality Date  . ABDOMINAL  HYSTERECTOMY     " partial "  . BALLOON DILATION N/A 10/24/2013   Procedure: BALLOON DILATION;  Surgeon: Gatha Mayer, MD;  [facility];  Service: Endoscopy;  Laterality: N/A;  . BIOPSY  09/26/2018   Procedure: BIOPSY;  Surgeon: Thornton Park, MD;  Location: [facility];  Service: Gastroenterology;;  . BIOPSY  01/09/2019   Procedure: BIOPSY;  Surgeon: Irving Copas., MD;  Location: [facility];  Service: Gastroenterology;;  . CESAREAN SECTION     x2  . DENTAL SURGERY     multiple extractions 3'15  . DILATION AND CURETTAGE OF UTERUS    . ESOPHAGOGASTRODUODENOSCOPY N/A 10/24/2013   Procedure: ESOPHAGOGASTRODUODENOSCOPY (  EGD);  Surgeon:  E , MD;  [facility];  Service: Endoscopy;  Laterality: N/A;  . ESOPHAGOGASTRODUODENOSCOPY N/A 01/09/2019   Procedure: ESOPHAGOGASTRODUODENOSCOPY (EGD);  Surgeon: Mansouraty, Gabriel Jr., MD;  [facility];  Service: Gastroenterology;  Laterality: N/A;  . ESOPHAGOGASTRODUODENOSCOPY (EGD) WITH PROPOFOL N/A 09/26/2018   Procedure: ESOPHAGOGASTRODUODENOSCOPY (EGD) WITH PROPOFOL;  Surgeon: Beavers, Kimberly, MD;  [facility];  Service: Gastroenterology;  Laterality: N/A;  . IR PARACENTESIS  09/23/2018  . LIVER BIOPSY  06/30/2012   Procedure: LIVER BIOPSY;  Surgeon: David H Newman, MD;  [facility];  Service: General;;  . NOVASURE ABLATION    . SHOULDER ARTHROSCOPY Right 2011  . UMBILICAL HERNIA REPAIR N/A 06/30/2012   Procedure: remove umbilicus;  Surgeon: David H Newman, MD;  [facility];  Service: General;  Laterality: N/A;  . UPPER ESOPHAGEAL ENDOSCOPIC ULTRASOUND (EUS) N/A 01/09/2019   Procedure: UPPER ESOPHAGEAL ENDOSCOPIC ULTRASOUND (EUS);  Surgeon: Mansouraty, Gabriel Jr., MD;  [facility];  Service: Gastroenterology;  Laterality: N/A;  . VENTRAL HERNIA REPAIR N/A 06/30/2012   Procedure: LAPAROSCOPIC VENTRAL HERNIA;  Surgeon: David H Newman, MD;  [facility];  Service:  General;  Laterality: N/A;  With Mesh  . VIDEO BRONCHOSCOPY Bilateral 07/20/2018   Procedure: VIDEO BRONCHOSCOPY WITHOUT FLUORO;  Surgeon: Ramaswamy, [name], MD;  [facility];  Service: Cardiopulmonary;  Laterality: Bilateral;  . 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   Review of Systems See HPI  Objective:   Physical Exam BP 132/77   Pulse 73   Temp (!) 97 F (36.1 C)   Ht 5' 6" (1.676 m)   Wt 252 lb (114.3 kg)   LMP  (LMP Unknown)   SpO2 97%   BMI 40.67 kg/m  Chronically ill but well-developed no acute distress Eyes anicteric Lungs clear Heart with 2/6 systolic murmur radiating into the right upper sternal border The abdomen is obese and likely distended with ascites as well. Extremities without cyanosis clubbing or edema The skin is notable for spider angiomata in the upper chest area, the abdomen has a patchy but very widely distributed pink discoloration no skin breakdown this is also in her groin and inner thigh area, interspersed with some normal areas, she also has it in the axillae It does not blanch its not palpable    She has an appropriate mood and affect and is alert and oriented x3     

## 2019-03-17 NOTE — Assessment & Plan Note (Signed)
Continue ursodiol

## 2019-03-24 ENCOUNTER — Ambulatory Visit (HOSPITAL_COMMUNITY): Payer: Self-pay | Attending: Internal Medicine

## 2019-03-28 ENCOUNTER — Telehealth: Payer: Self-pay | Admitting: Internal Medicine

## 2019-03-28 NOTE — Telephone Encounter (Signed)
Left message for patient to call back

## 2019-03-29 ENCOUNTER — Telehealth: Payer: Self-pay | Admitting: Internal Medicine

## 2019-03-29 NOTE — Telephone Encounter (Signed)
Patient is returning phone call.  Patient phone number is [phone].

## 2019-03-29 NOTE — Telephone Encounter (Signed)
Call returned to patient, confirmed DOB, she states her job called her and told her she can come back to work with her oxygen. She is requesting that we send the letter that we made for her back in 10/13 but just change the date. She states that letter will suffice it just needs to go to the address listed below:  [facility]  [place].  [place], [place]   I made her aware I would print the letter and place in MR folder to be signed and when he returns tomorrow his nurse will have him sign. Voiced understanding.   Will route message to MR and nurse.

## 2019-03-29 NOTE — Telephone Encounter (Signed)
Patient had one BM yesterday that was dark brown.  Stool si snot black.  She has not had any additional stools.  She will call back if she has black stools for an appt.

## 2019-03-29 NOTE — Telephone Encounter (Signed)
LMTCB

## 2019-03-31 ENCOUNTER — Encounter: Payer: Self-pay | Admitting: *Deleted

## 2019-03-31 ENCOUNTER — Other Ambulatory Visit: Payer: Self-pay

## 2019-03-31 ENCOUNTER — Ambulatory Visit (INDEPENDENT_AMBULATORY_CARE_PROVIDER_SITE_OTHER): Payer: Self-pay | Admitting: Family Medicine

## 2019-03-31 ENCOUNTER — Encounter: Payer: Self-pay | Admitting: Family Medicine

## 2019-03-31 VITALS — BP 110/64 | HR 71 | Wt 255.0 lb

## 2019-03-31 DIAGNOSIS — F32A Depression, unspecified: Secondary | ICD-10-CM

## 2019-03-31 DIAGNOSIS — Z599 Problem related to housing and economic circumstances, unspecified: Secondary | ICD-10-CM

## 2019-03-31 DIAGNOSIS — K921 Melena: Secondary | ICD-10-CM | POA: Insufficient documentation

## 2019-03-31 DIAGNOSIS — L240 Irritant contact dermatitis due to detergents: Secondary | ICD-10-CM

## 2019-03-31 DIAGNOSIS — F329 Major depressive disorder, single episode, unspecified: Secondary | ICD-10-CM

## 2019-03-31 DIAGNOSIS — Z598 Other problems related to housing and economic circumstances: Secondary | ICD-10-CM

## 2019-03-31 LAB — POCT HEMOGLOBIN: Hemoglobin: 9.9 g/dL — AB (ref 11–14.6)

## 2019-03-31 NOTE — Progress Notes (Addendum)
Subjective: Chief Complaint  Patient presents with  . Bruising on Body     HPI: Holly Mueller is a 53 y.o. presenting to clinic today to discuss the following:  1 Rash Has been happening since October.  No pain, no itching.  Started in her groin, now on abdomen, armpits, into legs.  Tried Ketoconazole cream with no improvement.  States that she feels like it is spreading, but it is not bothering her, other than she is concerned that it is not getting any better.  She has not tried anything over-the-counter for this.  Reports that she uses gain detergent, has not changed this recently.  Also states that she uses Dove body wash.  She does not however that since being in the hospital earlier this year, she has been living with her sister and that these things are hard, and she was not using them prior to living with her sister.  2 Dark Stools Called GI doctor last week and advised of dark stools.  Also notes dark black stools yesterday.  Denies new chest pain, fatigue, or dizziness.  Cyanoacrylate injection and ablation planned for December   3 Trouble affording medications Recently approved for Medicaid.  Notes that she is working on getting approved for disability as she cannot currently work due to her advanced interstitial lung disease and cirrhosis.  States that her green card expired in September.  Now that she has Medicaid, her medication cost has significantly decreased, but she is still having difficulty even affording $3 medications.  4 Mood Problems Can't work which causes her a lot of stress and problems.  Does not know if she wants help with this that present.  States that she thinks things are improving.      Office Visit from 03/31/2019 in [place]  PHQ-9 Total Score  11      ROS noted in HPI. Chief complaint noted.  Other Pertinent PMH: Interstitial lung disease, requiring oxygen, cirrhosis secondary to primary biliary cholangitis,  gastric varices secondary to cirrhosis Past Medical, Surgical, Social, and Family History Reviewed & Updated per EMR.      Social History   Tobacco Use  Smoking Status Former Smoker  . Packs/day: 0.25  . Types: Cigarettes  . Quit date: 24  . Years since quitting: 23.9  Smokeless Tobacco Never Used  Tobacco Comment   social-only tried never smoked a whole day 01/02/19   Smoking status noted.    Objective: BP 110/64   Pulse 71   Wt 255 lb (115.7 kg)   LMP  (LMP Unknown)   SpO2 97% Comment: _0   BMI 41.16 kg/m  Vitals and nursing notes reviewed  Physical Exam:  General: 53 y.o. female in NAD Lungs: Breathing comfortably on nasal cannula Skin: Blanching, flat, erythematous diffuse rash on abdomen, bilateral axilla, extending into groin bilaterally and onto upper thighs.  See photo below.  Patient examined with Dr. Owens Shark in room.       Results for orders placed or performed in visit on 03/31/19 (from the past 72 hour(s))  Hemoglobin     Status: Abnormal   Collection Time: 03/31/19 11:42 AM  Result Value Ref Range   Hemoglobin 9.9 (A) 11 - 14.6 g/dL    Assessment/Plan:  Irritant contact dermatitis due to detergent Discussed with Dr. Owens Shark.  Notes that given the distribution, and inability to improve with ketoconazole, this is likely a contact dermatitis caused by irritation from likely detergent and body  wash.  Will have patient change to free and clear detergent and unscented soaps.  If no improvement, can consider punch biopsy.  [place] Given that patient is already following with GI and planning for procedure for varices, would not need emergent evaluation and last hemoglobin is low.  Plan of care hemoglobin 9.9, no indication for transfusion at this time given that she is also asymptomatic.  Continue to follow with GI, planning for procedure in December.  Depression No SI.  Patient declines treatment at this time.  Did give list of therapist to take Medicaid.   Advised to follow-up if she would like to discuss this further.  Financial difficulties Patient now with Medicaid, but does not have income and is currently applying for disability.  She notes that even 3 at all her medications are difficult for her.  Her orange card has expired as of September.  Message sent to Penelope Galas to see if we can assist patient further.     PATIENT EDUCATION PROVIDED: See AVS    Diagnosis and plan along with any newly prescribed medication(s) were discussed in detail with this patient today. The patient verbalized understanding and agreed with the plan. Patient advised if symptoms worsen return to clinic or ER.    Orders Placed This Encounter  Procedures  . Hemoglobin    No orders of the defined types were placed in this encounter.    [place] Constable, DO 03/31/2019, 5:46 PM PGY-2 [place]

## 2019-03-31 NOTE — Assessment & Plan Note (Addendum)
No SI.  Patient declines treatment at this time.  Did give list of therapist to take Medicaid.  Advised to follow-up if she would like to discuss this further.

## 2019-03-31 NOTE — Assessment & Plan Note (Signed)
Patient now with Medicaid, but does not have income and is currently applying for disability.  She notes that even 3 at all her medications are difficult for her.  Her orange card has expired as of September.  Message sent to Penelope Galas to see if we can assist patient further.

## 2019-03-31 NOTE — Assessment & Plan Note (Signed)
Discussed with Dr. Owens Shark.  Notes that given the distribution, and inability to improve with ketoconazole, this is likely a contact dermatitis caused by irritation from likely detergent and body wash.  Will have patient change to free and clear detergent and unscented soaps.  If no improvement, can consider punch biopsy.

## 2019-03-31 NOTE — Patient Instructions (Signed)
Thank you for coming to see me today. It was a pleasure. Today we talked about:   Your rash looks like it is caused by detergent and soap.  You should change her detergent to a type with "free and clear" that does not have any scents.  Also make sure that you are buying unscented body wash.  With these changes, the rash should begin to improve in the next few weeks.  If you do not have improvement in the rash in the next 2 weeks, or you notice that it is worsening after changing this, please come back.  We will check your hemoglobin today to ensure that it is stable.  Follow-up with your liver doctor for your procedure in December.  I will send a message to Kennyth Lose to see about getting you another orange card.  If you decide that you need help with your mood, please let us know.  I will provide you with a list of counselors to take Medicaid.  Please follow-up with me in 2 months or sooner as needed.  If you have any questions or concerns, please do not hesitate to call the office at [phone].  Best,   Arizona Constable, DO  Therapy and Counseling Resources Most providers on this list will take Medicaid. Patients with commercial insurance or Medicare should contact their insurance company to get a list of in network providers.  [place] [address], [place], [place] [address]       [phone]     [facility] [address], [place], [place]    Jinny Blossom [facility] [address], [place], [place]  Family Solutions:  [place]. [place]  [facility] Counseling:  [place] [address], [place]  [facility] (under & uninsured) [address] [place] [phone]    [email or web address] .[facility] [place]     Phone:   [phone]     [place]  [place] [address]      [place], [place] [address] [place]: [place], [place], [place]   [place] ([place] therapist) [address] [place] [address]   [place] [address]    [phone]    [facility]   [place],  [place], [place]   [address] [place]  [place]  [facility]  [address] [place], [place]        [phone]  Open Access/Walk In Clinic under & uninsured [place],  [address] [place] [phone]):  Mon - Fri from 8 AM - 3 PM  [facility] of the [place],  ([place])   [place], [place]: [phone]) 8:30 - 12; 1 - 2:30  Family Service of the [place],  [place], [place]    [phone]):8:30 - 12; 2 - 3PM  [facility] [employer],  [address]  [place]; [phone]):   Mon - Fri 8 AM - 5 PM  [facility] [place]  MWF 12:30 to 3:00 or call to schedule an appointment  [phone]  Specific Provider options Psychology  Today  [email or web address] 1. click on find a therapist  2. enter your zip code 3. left side and select or tailor a therapist for your specific need.   [facility] Provider Directory [email or web address]  (Medicaid)   Follow all drop down to find a provider  [place] or [email or web address] [address], [place], [place] Recovery support and educational   In home counseling [place] Telephone: [phone]  office in [place] [email or web address]   Does not take reg. Medicaid or Medicare private insurance [facility], [place] health Choice, [facility], [place], [place], [place], [place]  Health Choice  24- Hour Availability:  . [place] or [phone]  . Family Service of the [employer] [phone]  [facility] Crisis Service  [phone]   . [place]  [phone] (after hours)  . Therapeutic Alternative/Mobile Crisis   [phone]  . [place] National Suicide Hotline  [phone] ([place])  . Call 911 or go to emergency room  . [employer]  [phone]);  [place] and [employer]   . [facility] ACCESS  [phone]); [place], [place], [place], [place], [place], [place], [place]

## 2019-03-31 NOTE — Assessment & Plan Note (Signed)
Given that patient is already following with GI and planning for procedure for varices, would not need emergent evaluation and last hemoglobin is low.  Plan of care hemoglobin 9.9, no indication for transfusion at this time given that she is also asymptomatic.  Continue to follow with GI, planning for procedure in December.

## 2019-03-31 NOTE — Telephone Encounter (Signed)
Letter signed and placed in mail at address provided by pt. Nothing further needed.

## 2019-04-03 ENCOUNTER — Telehealth: Payer: Self-pay | Admitting: Internal Medicine

## 2019-04-03 NOTE — Telephone Encounter (Signed)
Call returned to patient, confirmed DOB, she wanted to make sure her letter was mailed out to her job. I told her per her documentation the letter has been mailed. Voiced understanding.   Nothing further needed at this time.

## 2019-04-10 ENCOUNTER — Ambulatory Visit (INDEPENDENT_AMBULATORY_CARE_PROVIDER_SITE_OTHER): Payer: Medicaid Other | Admitting: Internal Medicine

## 2019-04-10 ENCOUNTER — Other Ambulatory Visit: Payer: Self-pay

## 2019-04-10 DIAGNOSIS — R0600 Dyspnea, unspecified: Secondary | ICD-10-CM

## 2019-04-10 DIAGNOSIS — J849 Interstitial pulmonary disease, unspecified: Secondary | ICD-10-CM

## 2019-04-10 DIAGNOSIS — J9611 Chronic respiratory failure with hypoxia: Secondary | ICD-10-CM | POA: Diagnosis not present

## 2019-04-10 DIAGNOSIS — J209 Acute bronchitis, unspecified: Secondary | ICD-10-CM

## 2019-04-10 MED ORDER — DOXYCYCLINE HYCLATE 100 MG PO TABS: 100.0000 mg | ORAL_TABLET | Freq: Two times a day (BID) | ORAL | 0 refills | Status: DC

## 2019-04-10 MED ORDER — ALBUTEROL SULFATE (2.5 MG/3ML) 0.083% IN NEBU: 2.5000 mg | INHALATION_SOLUTION | Freq: Four times a day (QID) | RESPIRATORY_TRACT | 2 refills | Status: AC | PRN

## 2019-04-10 MED ORDER — PREDNISONE 10 MG PO TABS: ORAL_TABLET | ORAL | 0 refills | Status: DC

## 2019-04-10 MED FILL — predniSONE 10 MG TABS: 10 | 8 days supply | Qty: 15 | Fill #0

## 2019-04-10 MED FILL — ALBUTEROL SUL 2.5 MG/3 ML S: (2.5 MG/3ML | 6 days supply | Qty: 75 | Fill #0

## 2019-04-10 MED FILL — DOXYCYCLINE HYCLATE 100 MG: 100 | 5 days supply | Qty: 10 | Fill #0

## 2019-04-10 NOTE — Progress Notes (Signed)
HPI Oc 2017  : Holly Mueller is a 53 year old with past history of asthma. She has poor control over the past year and needed to go on several tapers of prednisone. She was seen in the ED in July 2017 for an exacerbation. She is currently maintained on Symbicort and albuterol. She needs to use her albuterol every day. She denies any nighttime awakenings. She also has significant issues with sinusitis, postnasal drip with constant clearing of the throat. She has heartburn and is on Prilosec.   Interim History Aug 2018: Seen in ED in July 2018 for dyspnea, cough, sinus and throat congestion. CTA did not show PE, there was mild atelectasis, GGO at base. PFTs do not show obstruction. There is restriction but no ILD on CT Today in the office she has chronic cough, no dyspnea or wheezing  She is in being evaluated for cytopenia. She's had a bone marrow biopsy which did not show any abnormality. She has a right upper quadrant ultrasound and a GI Eval Pending for Evaluation of Cirrhosis.   Interim History feb 2019: Admitted from 2/4-06/19/17 with fevers, diagnosed with flu.  Treated with Tamiflu.  She was seen by GI at that time for evaluation of cirrhosis and recommended outpatient follow-up with Dr. Carlean Purl. Hospitalized again from 2/19-2/24/19 with cellulitis, lower extremity pain.  Workup was negative for PE and DVT.  She was treated with Keflex.  CT showed stable mild groundglass opacities consistent with volume overload.    Reports that breathing is stable.  Still continues on Symbicort and albuterol.  Chief complaint today is left lower extremity pain.  There is no swelling, erythema, fevers, chills.     Interim History May 2019: Hospitalized again and early May for chest pain, dyspnea.  She had a stress test which is normal. Also seen by pulmonary for CTA that was negative for PE but showed some chronic lung opacities. Evaluated for aspiration.  Swallow eval was normal Serologies  shows positive ANA, SSA    Nov 2019: Holly Mueller is a 53 year old with past history of asthma, cirrhosis, cytopenia, restrictive lung disease. Seen in the ED in July 2017 for an exacerbation. She is currently maintained on Symbicort and albuterol. She needs to use her albuterol every day. She denies any nighttime awakenings. She also has significant issues with sinusitis, postnasal drip with constant clearing of the throat. She has heartburn and is on Prilosec.  Admitted from 2/4-06/19/17 with fevers, diagnosed with flu.  Treated with Tamiflu.  She was seen by GI at that time for evaluation of cirrhosis and recommended outpatient follow-up with Dr. Carlean Purl. Hospitalized 2/19-2/24/19 with cellulitis, lower extremity pain.  Workup was negative for PE and DVT.  She was treated with Keflex.  CT showed stable mild groundglass opacities consistent with volume overload.    Hospitalized again in May 2019 for chest pain, dyspnea.  She had a stress test which is normal. Also seen by pulmonary for CTA that was negative for PE but showed some chronic lung opacities. Evaluated for aspiration.  Swallow eval was normal Serologies shows positive ANA, SSA  She has seen Dr. Lebron Conners, hematology for cytopenia status post bone marrow biopsy.  Her low blood counts are felt to be secondary to cirrhosis.  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 Exposures: Had exposed to mold in the past.  No dampness, heart type, Jacuzzi Smoking history: Denies smoking.  She is exposed to secondhand smoke Travel history:  No significant travel history Relevant family history: No significant family history of lung disease.   Interim History: Nov 2019 Chronic cough continues with mild dyspnea on exertion. Referred to Dr. Chase Caller by Dr, Joya Gaskins, primary care for second opinion regarding interstitial lung disease.   OV 05/25/2018  Subjective:  Patient ID: Holly Mueller, female , DOB: May 20, 1965 ,  age 62 y.o. , MRN: [id number] , ADDRESS: [place] [id number]   05/25/2018 -   Chief Complaint  Patient presents with   Follow-up    Pt states things have been worse for her since last visit. Pt has complaints of SOB which happens almost at any time, has an occ cough with white foamy mucus, and also has some complaints of CP.     HPI Holly Mueller 53 y.o. -referred by Dr. Asencion Noble and Dr. Marshell Garfinkel for interstitial lung disease.  Is a very complex history  Much of the work-up is already been done.  The time spent was in reviewing the chart and talking to the patient.  It appears she has idiopathic cirrhosis [reviewed and confirmed to 2014 or 2015 liver biopsy showing cirrhosis with active inflammation].  She does not follow-up with a GI doctor.  Her platelets run around 50-60000.  She sees Dr. Neldon Mc for allergy.  She used to be on asthma inhaler but following repeated normal nitric oxide her Symbicort was stopped maybe approximately over a year ago.  She is not sure if the cough is worse after that.    Her current problem seems to be dyspnea on exertion for the last few to several years progressively getting worse.  She also has constant chronic cough.  She is not on oxygen.  She does not recollect having her overnight oxygen desaturation tested.  It appears a few years ago her CT scans were fairly clear and suggestive of volume overload but with time the CT scans of change.  Most recent CT scan of the chest in late 2019 is reported as probable UIP based on craniocaudal gradient and some traction bronchiectasis without honeycombing.  The findings are early and mild.  I personally visualized the CT and agree with the findings.  Rest of the ILD question is all worked up she is got trace positive autoimmune antibodies.  Rheumatology referral is pending and an appointment is pending according to the patient.  The only PFTs from July 2018 which suggest  restriction.      IMPRESSION: 1. The appearance of the lungs is compatible with interstitial lung disease, with a spectrum of findings categorized as probable usual interstitial pneumonia (UIP) based on current ATS guidelines. Repeat high-resolution chest CT is suggested in 12 months to assess for temporal changes in the appearance of the lung parenchyma. 2. Aortic atherosclerosis. 3. Cirrhosis with stigmata of portal venous hypertension, including splenomegaly.  Aortic Atherosclerosis (ICD10-I70.0).   Electronically Signed   By: Vinnie Langton M.D.   On: 04/05/2018 18:24   Results for KALEAH, HAGEMEISTER (MRN [id number]) as of 05/25/2018 10:40  Ref. Range 10/06/2017 16:42  IgE (Immunoglobulin E), Serum Latest Ref Range: <OR=114 kU/L <2  ASPERGILLUS FUMIGATUS Latest Ref Range: NEGATIVE  NEGATIVE  [place] Serum Latest Ref Range: NEGATIVE  NEGATIVE  Anti Nuclear Antibody(ANA) Latest Ref Range: NEGATIVE  POSITIVE (A)  ANA Pattern 1 Unknown SPECKLED (A)  ANA Titer 1 Latest Units: titer 8:24 (H)  Cyclic Citrullin Peptide Ab Latest Units: UNITS <16  ds DNA Ab Latest Units: IU/mL 2  RA Latex Turbid. Latest Ref Range: <14 IU/mL <14  ENA SM Ab Ser-aCnc Latest Ref Range: <1.0 NEG AI <1.0 NEG  SSA (Ro) (ENA) Antibody, IgG Latest Ref Range: <1.0 NEG AI >8.0 POS (A)  SSB (La) (ENA) Antibody, IgG Latest Ref Range: <1.0 NEG AI <1.0 NEG  Scleroderma (Scl-70) (ENA) Antibody, IgG Latest Ref Range: <1.0 NEG AI <1.0 NEG  SM/RNP Latest Ref Range: <1.0 NEG AI <1.0 NEG    Cirrhosis labs - Results for SHAHARA, HARTSFIELD (MRN [id number]) as of 05/25/2018 12:00  Ref. Range 04/26/2018 23:47  Platelets Latest Ref Range: 150 - 400 K/uL 51 (L)    OV 07/12/2018  Subjective:  Patient ID: Holly Mueller, female , DOB: 1966-03-05 , age 10 y.o. , MRN: [id number] , ADDRESS: [place] [id number]   07/12/2018 -   Chief Complaint  Patient presents with   Follow-up    PFT done today. She states  her breathing has been okay. She states she has good days and bad days but she makes sure to pace herself. Recenly seen in ED for URI.    Follow-up probable UIP on CT scan of the chest November 2019 with worsening compared to July 2018.  Female gender.  Positive ANA and SSA associated with clubbing and bilateral basal Velcro crackles -definition of ILD in progress.  Associated with cirrhosis and low platelets, bili 2.2 idiopathic variety  HPI Holly Mueller 53 y.o. -returns for follow-up of her ILD.  In the interim we discussed her case at the multidisciplinary case conference.  The consensus was that she did indeed have probable UIP on the CT scan [greater than 80% probability that this is indeed UIP].  Radiologist was concerned about progression between summer 2018 and the fall 2019 based on CT scan appearance.  In the interim overall she is stable although since seeing me she is having respiratory exacerbation has been treated with antibiotic and prednisone.  The prednisone is still continuing with tomorrow being the last day.  I reviewed the primary care physician chart and confirmed it.  She is beginning to feel close to baseline.  She had pulmonary function test today that shows very similar values compared to summer 2018.  Therefore in essence she is stable on PFTs compared to summer 2018 but the decline is on the CT scan.  Her most recent liver work shows a rising bilirubin.  She says she has not seen a hepatologist or GI specialist in a few years.  She is willing to undergo bronchoscopy to narrow down the specific variety of ILD.  We discussed her tolerance to sedation.  She does not have any sleep apnea.  Her last procedure was 3 years ago and 5 years ago .  During this time she has held well with sedation procedures.         OV 02/06/2019  Subjective:  Patient ID: Holly Mueller, female , DOB: Jun 30, 1965 , age 75 y.o. , MRN: [id number] , ADDRESS: [address] [place] [address]   02/06/2019 -   Chief Complaint  Patient presents with   Follow-up    6 month rov.  c/o stable sob with exertion, a sometimes prod cough with clear mucus.      Follow-up probable UIP on CT scan of the chest November 2019 with worsening compared to July 2018.  Female gender.  Positive ANA and SSA associated with clubbing and bilateral basal Velcro crackles -.  Associated with  cirrhosis Hepatic cirrhosis [?  Placey] due to primary biliary cholangitis ([place]) and low platelets, and gastric varices with bleeding. BAL March 2020 with mixed ceullarity - 54% PMN nand 28% lymphs c/w IPF     HPI Holly Mueller 53 y.o. -presents for follow-up of interstitial lung disease that is presumed IPF with high clinical pretest probability [other differential diagnosis is i.e. IPAF] since I last saw her in March 2020 when she had mixed cellularity in her BAL she has been diagnosed with confirmed hepatic cirrhosis with biliary cholangitis.  She is also had gastric varices.  She had an admission in summer 2020 for worsening acute hypoxemic respiratory failure.  Since then she is been on oxygen.  Today we turned the oxygen off and she is normoxic at rest but easily desaturates with walking.  This is significant deterioration.  She had a CT scan of the chest in July 2020 but there is motion artifact with this.  She had repeat pulmonary function test now and this is documented below.  The FVC is stable but her DLCO is deteriorated.  She is also had ascites for her cirrhosis.  She is under the management of Dr. Carlean Purl.  She still interested in the question of anti-fibrotic's but she has significant amount of dyspnea and cough as documented below.  The oxygen and nebulizers helped the cough only somewhat but she still has a lot of cough.  She is on Mucinex and [employer] but these are not helping the cough.  We had a conversation again that anti-fibrotic's risky in the presence of cirrhosis.   OV  04/10/2019 - telephone visit in setting of covid-19 pandemic  Subjective:  Patient ID: Holly Mueller, female , DOB: Jul 28, 1965 , age 54 y.o. , MRN: [id number] , ADDRESS: [address] [place] [address]   04/10/2019 -   Chief Complaint  Patient presents with   televisit    Pt states she has been having complaints with her breathing and states she is having to use more O2 than before. Pt is on 2L O2. Pt also has been coughing which is a dry cough and occ will get up clear to yellow phlegm.    Follow-up probable UIP on CT scan of the chest November 2019 with worsening compared to July 2018.  Female gender.  Positive ANA and SSA associated with clubbing and bilateral basal Velcro crackles -.  Associated with cirrhosis Hepatic cirrhosis [?  Placey] due to primary biliary cholangitis ([place]) and low platelets, and gastric varices with bleeding. BAL March 2020 with mixed ceullarity - 54% PMN nand 28% lymphs c/w UIP. On supportive care due to cirrhosis . Recommended disability end sept 2020    HPI Holly Mueller 53 y.o. -  Now living with siblings in [place] so she can get help. Still awaiting disability. Using o2. Has seen GI early nov 2020. Awaiting "cyanoacrylate injection and ablation planned for December" to complete per his notes. No bleeding noted in his notes or per history currently. However, says some dark stool noted a few times in the past month and 1-2 times it was black. Has repored this to Dr Carlean Purl per history.  Rx for bronchitis and congestion on tele visit a month ago. Now sttill on 2 L [place] . Overall feels same . Currenty cough with clear-yellow sputum or sometimes dry. She feels she needs another round of antibiotic and prednisone   This is a telephone visit and the risks, benefits and limitations  was explained to her with the medical assistant.  She was identified with 2 person identifier.    SYMPTOM SCALE - ILD 02/06/2019   O2 use 2L exertion  Shortness of  Breath 0 -> 5 scale with 5 being worst (score 6 If unable to do)  At rest 2  Simple tasks - showers, clothes change, eating, shaving 4  Household (dishes, doing bed, laundry)   Shopping 5  Walking level at own pace 5  Walking keeping up with others of same age 65  Walking up Stairs 5  Walking up [place] 5  Total (40 - 48) Dyspnea Score 35  How bad is your cough? 5  How bad is your fatigue On and off         Simple office walk 250 feet x  3 laps goal with forehead probe 05/25/2018  02/06/2019   O2 used Room air Room air  Number laps completed 3 Aimed 3 but stoppled 6 tims in 1 lap  Comments about pace slow   Resting Pulse Ox/HR 100% and 70/min 92% and 98/min  Final Pulse Ox/HR 94% and 92/min 86% and 117/min  Desaturated </= 88% no yes  Desaturated <= 3% points Yes, 6 points Yes, 8 points  Got Tachycardic >/= 90/min yes yes  Symptoms at end of test Mild dyspnea final lap dy  Miscellaneous comments x      Results for Holly Mueller, Holly "[name]" (MRN [id number]) as of 02/06/2019 10:25  Ref. Range 11/13/2016 11:21 07/12/2018 13:58 01/11/2019 10:08  FVC-Pre Latest Units: L 1.80 1.87 1.88  FVC-%Pred-Pre Latest Units: % 48 50 49  Results for Holly Mueller, Holly [name] "[name]" (MRN [id number]) as of 02/06/2019 10:25  Ref. Range 11/13/2016 11:21 07/12/2018 13:58 01/11/2019 10:08  DLCO unc Latest Units: ml/min/mmHg  14.68 11.45  DLCO unc % pred Latest Units: %  66 50       ROS - per HPI     has a past medical history of Acute respiratory failure with hypoxia ([place]) (09/22/2018), Anxiety, Arthritis, Chronic kidney disease, Community acquired pneumonia (11/28/2018), Diabetes mellitus without complication ([place]), Edentulous, Epilepsy ([place]) (in 1972), Gastric varices with bleeding, GERD (gastroesophageal reflux disease) (2008), Headache(784.0), 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 ([place]), History of blood transfusion, Hypothyroidism (2008), Interstitial lung disease ([place]), Leukopenia, Lower  esophageal ring (08/18/2013), Pneumonia, Primary biliary cholangitis ([place]) (10/24/2018), Seasonal allergies (2003), SEIZURES, HX OF (12/12/2009), Shortness of breath, and Wears glasses.   reports that she quit smoking about 23 years ago. Her smoking use included cigarettes. She smoked 0.25 packs per day. She has never used smokeless tobacco.  Past Surgical History:  Procedure Laterality Date   ABDOMINAL HYSTERECTOMY     " partial "   BALLOON DILATION N/A 10/24/2013   Procedure: BALLOON DILATION;  Surgeon: Gatha Mayer, MD;  Location: [facility];  Service: Endoscopy;  Laterality: N/A;   BIOPSY  09/26/2018   Procedure: BIOPSY;  Surgeon: Thornton Park, MD;  Location: [facility];  Service: Gastroenterology;;   BIOPSY  01/09/2019   Procedure: BIOPSY;  Surgeon: Irving Copas., MD;  Location: [facility];  Service: Gastroenterology;;   CESAREAN SECTION     x2   DENTAL SURGERY     multiple extractions 3'15   DILATION AND CURETTAGE OF UTERUS     ESOPHAGOGASTRODUODENOSCOPY N/A 10/24/2013   Procedure: ESOPHAGOGASTRODUODENOSCOPY (EGD);  Surgeon: Gatha Mayer, MD;  Location: [facility];  Service: Endoscopy;  Laterality: N/A;   ESOPHAGOGASTRODUODENOSCOPY N/A 01/09/2019   Procedure: ESOPHAGOGASTRODUODENOSCOPY (EGD);  Surgeon: Irving Copas., MD;  Location: [facility];  Service: Gastroenterology;  Laterality: N/A;   ESOPHAGOGASTRODUODENOSCOPY (EGD) WITH PROPOFOL N/A 09/26/2018   Procedure: ESOPHAGOGASTRODUODENOSCOPY (EGD) WITH PROPOFOL;  Surgeon: Thornton Park, MD;  Location: [facility];  Service: Gastroenterology;  Laterality: N/A;   IR PARACENTESIS  09/23/2018   LIVER BIOPSY  06/30/2012   Procedure: LIVER BIOPSY;  Surgeon: Shann Medal, MD;  [facility];  Service: General;;   NOVASURE ABLATION     SHOULDER ARTHROSCOPY Right [id number]   UMBILICAL HERNIA REPAIR N/A 06/30/2012   Procedure: remove umbilicus;  Surgeon: Shann Medal, MD;  [facility];   Service: General;  Laterality: N/A;   UPPER ESOPHAGEAL ENDOSCOPIC ULTRASOUND (EUS) N/A 01/09/2019   Procedure: UPPER ESOPHAGEAL ENDOSCOPIC ULTRASOUND (EUS);  Surgeon: Irving Copas., MD;  Location: [facility];  Service: Gastroenterology;  Laterality: N/A;   VENTRAL HERNIA REPAIR N/A 06/30/2012   Procedure: LAPAROSCOPIC VENTRAL HERNIA;  Surgeon: Shann Medal, MD;  [facility];  Service: General;  Laterality: N/A;  With Mesh   VIDEO BRONCHOSCOPY Bilateral 07/20/2018   Procedure: VIDEO BRONCHOSCOPY WITHOUT FLUORO;  Surgeon: Brand Males, MD;  [facility];  Service: Cardiopulmonary;  Laterality: Bilateral;   WISDOM TOOTH EXTRACTION      Allergies  Allergen Reactions   Aspirin Nausea Only    Upset stomach    Immunization History  Administered Date(s) Administered   Influenza-Unspecified 01/11/2019   Pneumococcal Polysaccharide-23 08/26/2011, 09/12/2017   Td 05/11/2006    Family History  Problem Relation Age of Onset   Hypertension Mother    Diabetes Mother    Cirrhosis Mother        ? medications   Lung cancer Father    Stroke Father    Diabetes Sister    Hypertension Sister    Colon cancer Neg Hx    Esophageal cancer Neg Hx    Rectal cancer Neg Hx      Current Outpatient Medications:    acetaminophen (TYLENOL) 500 MG tablet, Take 1,000 mg by mouth every 6 (six) hours as needed for mild pain., Disp: , Rfl:    albuterol (PROAIR HFA) 108 (90 Base) MCG/ACT inhaler, INHALE 2 PUFFS INTO THE LUNGS EVERY 6 HOURS AS NEEDED FOR SHORTNESS OF BREATH (Patient taking differently: Inhale 2 puffs into the lungs every 4 (four) hours as needed for wheezing or shortness of breath. ), Disp: 8.5 g, Rfl: 5   albuterol (PROVENTIL) (2.5 MG/3ML) 0.083% nebulizer solution, Take 3 mLs (2.5 mg total) by nebulization every 6 (six) hours as needed for wheezing or shortness of breath., Disp: 75 mL, Rfl: 2   blood glucose meter kit and supplies, Dispense  based on patient and insurance preference. Use up to four times daily as directed. (FOR ICD-10 E10.9, E11.9)., Disp: 1 each, Rfl: 0   budesonide-formoterol (SYMBICORT) 80-4.5 MCG/ACT inhaler, Inhale 2 puffs into the lungs 2 (two) times daily for 1 day., Disp: 1 Inhaler, Rfl: 0   cetirizine (ZYRTEC) 10 MG tablet, Take 1 tablet (10 mg total) by mouth daily., Disp: 30 tablet, Rfl: 11   fluticasone (FLONASE) 50 MCG/ACT nasal spray, Place 2 sprays into both nostrils daily., Disp: , Rfl:    glucose blood (TRUE METRIX BLOOD GLUCOSE TEST) test strip, Use as instructed, Disp: 100 each, Rfl: 12   guaiFENesin (MUCINEX) 600 MG 12 hr tablet, Take 600 mg by mouth 2 (two) times daily as needed for cough., Disp: , Rfl:    ketoconazole (NIZORAL) 2 %  cream, Apply 1 application topically daily., Disp: 15 g, Rfl: 0   levothyroxine (SYNTHROID) 125 MCG tablet, Take 2 tablets (250 mcg total) by mouth daily., Disp: 60 tablet, Rfl: 0   metFORMIN (GLUCOPHAGE) 500 MG tablet, Take 1 tablet (500 mg total) by mouth 2 (two) times daily with a meal., Disp: 180 tablet, Rfl: 2   OXYGEN, Inhale into the lungs. Use sleeping and when out and about, Disp: , Rfl:    pantoprazole (PROTONIX) 40 MG tablet, Take 1 tablet (40 mg total) by mouth 2 (two) times daily before a meal., Disp: 60 tablet, Rfl: 3   SPIRIVA HANDIHALER 18 MCG inhalation capsule, INHALE 1 CAPSULE VIA HANDIHALER ONCE DAILY AT THE SAME TIME EVERY DAY, Disp: 30 capsule, Rfl: 0   tamsulosin (FLOMAX) 0.4 MG CAPS capsule, Take 1 capsule (0.4 mg total) by mouth daily., Disp: 30 capsule, Rfl: 3   TRUEplus Lancets 28G MISC, Use to check blood sugar as directed., Disp: 100 each, Rfl: 1   ursodiol (ACTIGALL) 500 MG tablet, Take 1 tablet (500 mg total) by mouth 3 (three) times daily., Disp: 270 tablet, Rfl: 3      Objective:   There were no vitals filed for this visit.  Estimated body mass index is 41.16 kg/m as calculated from the following:   Height as of  03/17/19: 5' 6" (1.676 m).   Weight as of 03/31/19: 255 lb (115.7 kg).  _0 @  There were no vitals filed for this visit.   Physical Exam Telephone visit -  Spoke full sentences. Had some cough intermittently         Assessment:       ICD-10-CM   1. Chronic respiratory failure with hypoxia (HCC)  J96.11   2. ILD (interstitial lung disease) ([place])  J84.9   3. Acute bronchitis, unspecified organism  J20.9        Plan:     Patient Instructions     ICD-10-CM   1. Chronic respiratory failure with hypoxia (HCC)  J96.11   2. ILD (interstitial lung disease) ([place])  J84.9   3. Acute bronchitis, unspecified organism  J20.9     Possibly progressive ILD Now with acute bronchitis - maybe  Plan Take doxycycline 183m po twice daily x 5 days; take after meals and avoid sunlight  Take prednisone 40 mg daily x 2 days, then 261mdaily x 2 days, then 1049maily x 2 days, then 5mg30mily x 2 days and stop  Continue o2 and inhalers  Followup  3-5 weeks in ILD clinic or 30 min slot - will turn o2 off and assess hypoxemia status as surrogate marker for progression     SIGNATURE    Dr. MuraBrand MalesD., F.C.C.P,  Pulmonary and Critical Care Medicine Staff Physician, [facility] Interstitial [facility] Disease  Program  [place]ulmonary [place], [id number]: [phone] no answer or between  15:00h - 7:00h: call [phone] Telephone: [phone]  9:22 AM 04/10/2019

## 2019-04-10 NOTE — Patient Instructions (Addendum)
ICD-10-CM   1. Chronic respiratory failure with hypoxia (HCC)  J96.11   2. ILD (interstitial lung disease) ([place])  J84.9   3. Acute bronchitis, unspecified organism  J20.9     Possibly progressive ILD Now with acute bronchitis - maybe  Plan Take doxycycline 116m po twice daily x 5 days; take after meals and avoid sunlight  Take prednisone 40 mg daily x 2 days, then 276mdaily x 2 days, then 1061maily x 2 days, then 5mg27mily x 2 days and stop  Continue o2 and inhalers  Followup  3-5 weeks in ILD clinic or 30 min slot - will turn o2 off and assess hypoxemia status as surrogate marker for progression  - consider ILD PRO study at followup

## 2019-04-10 NOTE — Addendum Note (Signed)
Addended by: Lorretta Harp on: 04/10/2019 10:30 AM   Modules accepted: Orders

## 2019-04-12 ENCOUNTER — Other Ambulatory Visit: Payer: Self-pay | Admitting: Family Medicine

## 2019-04-12 MED FILL — metFORMIN HCL 500 MG TABS: 500 | 30 days supply | Qty: 60 | Fill #1

## 2019-04-12 MED FILL — PANTOPRAZOLE SOD DR 40 MG T: 40 | 30 days supply | Qty: 60 | Fill #2

## 2019-04-12 MED FILL — URSODIOL 500 MG TABLET: 500 | 30 days supply | Qty: 90 | Fill #1

## 2019-04-13 ENCOUNTER — Ambulatory Visit: Payer: Medicaid Other | Admitting: Pulmonary Disease

## 2019-04-13 MED FILL — LEVOTHYROXINE 125 MCG TAB: 125 | 30 days supply | Qty: 60 | Fill #0

## 2019-04-17 ENCOUNTER — Telehealth: Payer: Self-pay | Admitting: Internal Medicine

## 2019-04-17 DIAGNOSIS — K743 Primary biliary cirrhosis: Secondary | ICD-10-CM

## 2019-04-17 NOTE — Telephone Encounter (Signed)
I called her back and she said she never went for her complete abdominal Ultrasound that Dr Carlean Purl had ordered at her office visit 03/17/2019 because she was moving. She wants advise because her pants are tight, left lower leg swelling from her toes into ankle and at times into her calf. She is propping up on pillows because some trouble breathing . She is on medicine for a "lung infection" -doxycycline and prednisone , rx'ed by Dr Guadalupe Dawn. She will finish both of those soon and has appointment with him at the end of the month.  Will route to Dr Rush Landmark as he has helped with her care in the past and Dr Carlean Purl is away this week.

## 2019-04-17 NOTE — Telephone Encounter (Signed)
I called and spoke with [name] and she said it does seem to be mostly the left leg swelling. I advised her as Dr Rush Landmark stated and she is going to contact her PCP tomorrow and I will order her complete abdominal U/S tomorrow when they open.

## 2019-04-17 NOTE — Telephone Encounter (Signed)
PJ, please reach out to patient tomorrow and if this is single-sided leg swelling this seems abnormal to be a complication from her underlying liver disease. She likely needs to talk with her primary care doctor and she may need a ultrasound of her lower extremity which will have to be deferred to her primary care provider. In regards to getting the abdominal ultrasound she can get that at her convenience. However, things are progressing and she cannot manage it at home and things are worsening she needs to see her PCP especially, if this is single-sided lower extremity edema. You may keep me up-to-date as able, however I am in the hospital this week so I will be looking at my chart intermittently over the course of this week. Please keep the patient's PCP Dr. Sandi Carne up-to-date if this turns out to be single-sided lower extremity edema. Thanks. GM

## 2019-04-18 NOTE — Telephone Encounter (Signed)
I have staff messaged Dr Sandi Carne to be on the lookout for her to make an appointment.

## 2019-04-18 NOTE — Telephone Encounter (Signed)
I have set up patient's complete abdominal U/S for 04/21/2019 at 11:30AM, arrive at 11:15 AM at [facility]. I will reach out to [place] with this information. I was able to speak with [name] and give her the appointment date/time. I also reminded her to call her PCP which she said she would.

## 2019-04-19 ENCOUNTER — Telehealth: Payer: Self-pay | Admitting: Internal Medicine

## 2019-04-19 NOTE — Telephone Encounter (Signed)
I spoke with Holly Mueller and she said she has an appointment with Dr Addison Naegeli on 04/28/2019 to check her leg. He is in the same group with Dr Arizona Constable.

## 2019-04-19 NOTE — Telephone Encounter (Signed)
Questions answered about her ultrasound for Friday.

## 2019-04-20 NOTE — Telephone Encounter (Signed)
Pls call pt, she needs to speak with you about her [place] tomorrow.

## 2019-04-20 NOTE — Telephone Encounter (Signed)
Patient called to let me know that her Medicaid has been approved but she doesn't have a card yet. Her U/S has been cancelled for tomorrow because they didn't know how long an authorization would take. I will call our prior authorization lady , Amy Hazelwood tomorrow and check into this.

## 2019-04-21 ENCOUNTER — Telehealth: Payer: Self-pay | Admitting: Internal Medicine

## 2019-04-21 ENCOUNTER — Ambulatory Visit (HOSPITAL_COMMUNITY): Payer: Medicaid Other

## 2019-04-21 NOTE — Telephone Encounter (Signed)
Holly Mueller has been updated on the [facility] # and she was able to get her complete abdominal U/S authorized and I R/S'ed it for 04/24/2019 at 8:30AM, arrive at 8:15AM at [place] is aware of the date/time for this.

## 2019-04-21 NOTE — Telephone Encounter (Signed)
Patient's complete abdominal U/S has been set up for 04/24/2019 at [facility] for 8:30 AM, arrive at 8:15AM, NPO midnight. Patient aware of date and time. Amy Mason Jim got approval under her new medicaid #.

## 2019-04-22 ENCOUNTER — Other Ambulatory Visit (HOSPITAL_COMMUNITY)
Admission: RE | Admit: 2019-04-22 | Discharge: 2019-04-22 | Disposition: A | Discharge status: Home / Self Care | Payer: Medicaid Other | Source: Ambulatory Visit | Attending: Gastroenterology | Admitting: Gastroenterology

## 2019-04-22 DIAGNOSIS — Z20828 Contact with and (suspected) exposure to other viral communicable diseases: Secondary | ICD-10-CM | POA: Diagnosis not present

## 2019-04-22 DIAGNOSIS — Z01812 Encounter for preprocedural laboratory examination: Secondary | ICD-10-CM | POA: Insufficient documentation

## 2019-04-23 LAB — NOVEL CORONAVIRUS, NAA (HOSP ORDER, SEND-OUT TO REF LAB; TAT 18-24 HRS): SARS-CoV-2, NAA: NOT DETECTED

## 2019-04-24 ENCOUNTER — Ambulatory Visit (HOSPITAL_COMMUNITY): Payer: Medicaid Other

## 2019-04-25 ENCOUNTER — Encounter (HOSPITAL_COMMUNITY): Payer: Self-pay | Admitting: Gastroenterology

## 2019-04-25 NOTE — Anesthesia Preprocedure Evaluation (Addendum)
Anesthesia Evaluation  Patient identified by MRN, date of birth, ID band Patient awake    Reviewed: Allergy & Precautions, NPO status , Patient's Chart, lab work & pertinent test results  Airway Mallampati: II  TM Distance: >3 FB     Dental   Pulmonary pneumonia, Current Smoker,    breath sounds clear to auscultation       Cardiovascular + DOE  negative cardio ROS  + dysrhythmias + Valvular Problems/Murmurs  Rhythm:Regular Rate:Normal     Neuro/Psych    GI/Hepatic GERD  ,  Endo/Other  diabetesHypothyroidism   Renal/GU Renal disease     Musculoskeletal   Abdominal   Peds  Hematology   Anesthesia Other Findings   Reproductive/Obstetrics                            Anesthesia Physical Anesthesia Plan  ASA: III  Anesthesia Plan: General   Post-op Pain Management:    Induction: Intravenous  PONV Risk Score and Plan: 3 and Propofol infusion  Airway Management Planned: Oral ETT  Additional Equipment:   Intra-op Plan:   Post-operative Plan: Possible Post-op intubation/ventilation  Informed Consent: I have reviewed the patients History and Physical, chart, labs and discussed the procedure including the risks, benefits and alternatives for the proposed anesthesia with the patient or authorized representative who has indicated his/her understanding and acceptance.     Dental advisory given  Plan Discussed with: Anesthesiologist and CRNA  Anesthesia Plan Comments: (PAT note written 04/25/2019 by Myra Gianotti, PA-C. )      Anesthesia Quick Evaluation

## 2019-04-25 NOTE — Progress Notes (Signed)
Ms Mcculloh denies chest pain.  Patient has ILD and wears oxygen 2 liters, she reports becoming short of breath walking a short distance. Ms Diffee was negative for Covid and has been in quarantine . Ms Haydon has type II diabetes. I instructed patient to not take Metformin in am.  Ms Salatino states that CBG's run under 200.  I instructed patient to check CBG after awaking and every 2 hours until arrival  to the hospital.  I Instructed patient if CBG is less than 70 to drink  1/2 cup of a clear juice. Recheck CBG in 15 minutes then call pre- op desk at [phone] for further instructions.

## 2019-04-25 NOTE — Progress Notes (Signed)
Anesthesia Chart Review: SAME DAY WORK-UP (ENDO)    Case: [id number] Date/Time: 04/26/19 0900   Procedures:      ESOPHAGEAL ENDOSCOPIC ULTRASOUND (EUS) RADIAL (N/A ) - Please have cyanoacrylate glue      ESOPHAGOGASTRODUODENOSCOPY (EGD) WITH PROPOFOL (N/A )   Anesthesia type: Monitor Anesthesia Care   Pre-op diagnosis: gastric varices   Location: [facility] ROOM 1 / [place] [facility]   Surgeons: Mansouraty, Telford Nab., MD      DISCUSSION: Patient is a 53 year old female scheduled for the above procedure.   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   04/22/2019 COVID-19 test negative. Anesthesia team to evaluate on the day of her procedure   PROVIDERS: Meccariello, Bernita Raisin, DO is PCP [facility])  - Brand Males, MD is pulmonologist. Last visit 04/10/19 for ILD follow-up. Had positive ANA and SSA.  Notes indicate that her case was discussed at the multidisciplinary case conference and the consensus was that "she did indeed have probable UIP on the CT scan" at 80% probability. Bronchoscopy with lavage was recommended and done 07/20/18 (no biopsy due to thrombocytopenia) that showed no malignant cells and had negative AFB culture.  Silvano Rusk, MD is GI Rolin Barry, MD is hematologist. Last visit 07/28/17. Seen for cytopenia, s/p bone marrow biopsy. Low blood counts attributed to cirrhosis. Croitoru, Dani Gobble, MD is cardiologist. Last visit 10/14/17 with Kerin Ransom, PA-C for chest pain follow-up with low risk stress test 09/2017.   LABS: Per GI. As of 03/17/19, Cr 0.59, glucose 104, AST 44, ALT 20, total bili 1.9, PLT count 67.0. HGB 9.9 03/31/19. A1c 4.8% 12/09/18.   Spirometry 01/11/19: FVC 1.88 (49%), FEV1 1.55  (52%), DLCO unc 11.45 (50%).    IMAGES: CT Chest High Resolution 12/09/18: IMPRESSION: 1. Examination for interstitial lung disease is very limited by low volume, expiratory technique and breath motion artifact throughout. Within this limitation, there is probable bibasilar partial atelectasis and mild bibasilar fibrotic changes of the lateral lung bases are not well elucidated. There is no definite acutely superimposed airspace opacity. 2. Request for technical repeat of this examination was submitted to CT technologists at 12:44 p.m., technologist determination reported back at 3:45 p.m. was that images submitted are the best possible. Technical repeat examination could be attempted at a later time if acute shortness of breath is improved. 3.  Cardiomegaly.   EKG: 11/20/18: ST at 103 bpm   CV: BLE Venous US 11/07/18: Summary: Right: No evidence of common femoral vein obstruction. Left: There is no evidence of deep vein thrombosis in the lower extremity.   Echo 09/24/18: IMPRESSIONS  1. The left ventricle has normal systolic function with an ejection fraction of 60-65%. The cavity size was normal. Left ventricular diastolic parameters were normal. No evidence of left ventricular regional wall motion abnormalities.  2. The right ventricle has normal systolic function. The cavity was mildly enlarged. There is no increase in right ventricular wall thickness.  3. There is mild to moderate mitral annular calcification present. Mitral valve regurgitation was not assessed by color flow Doppler.   4. The aortic valve is tricuspid. Moderate thickening of the aortic valve. Mild calcification of the aortic valve. Aortic valve regurgitation was not assessed by color flow Doppler. Very mild stenosis of the aortic valve, with  a calculated valve area of 1.88 cm2. AV peak gradient 26.5 mmHg. AV mean gradient 12.8 mmHg.    Nuclear stress test 09/12/17:  Probable normal perfusion and minimal soft  tissue attenuation (breast) No significant ischemia or scar  Nuclear stress EF: 57%.  This is a low risk study.   Past Medical History:  Diagnosis Date  . Acute respiratory failure with hypoxia ([place]) 09/22/2018  . Anxiety    "anxiety attacks- sometimes"  . Arthritis    bursitis left hip flares-not an issue  . Chronic kidney disease   . Community acquired pneumonia 11/28/2018  . Diabetes mellitus without complication ([place])    Type II  . Edentulous    10-19-13 at present  . Epilepsy ([place]) in 1972   No seizures since 1972. Previously treated with phenobarbital.   . Gastric varices with bleeding   . GERD (gastroesophageal reflux disease) 2008  . Headache(784.0)    hx of migraines   . 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 ([place])   . History of blood transfusion    gi bleed  . Hypothyroidism 2008  . Interstitial lung disease ([place])   . Leukopenia   . Lower esophageal ring 08/18/2013  . Pneumonia   . Primary biliary cholangitis ([place]) 10/24/2018  . Seasonal allergies 2003  . SEIZURES, HX OF 12/12/2009   Annotation: last seizure in early 1970s Qualifier: Diagnosis of  By: Carlena Sax  MD, Colletta [place]    . Shortness of breath   . Wears glasses     Past Surgical History:  Procedure Laterality Date  . ABDOMINAL HYSTERECTOMY     " partial "  . BALLOON DILATION N/A 10/24/2013   Procedure: BALLOON DILATION;  Surgeon: Gatha Mayer, MD;  Location: [facility];  Service: Endoscopy;  Laterality: N/A;  . BIOPSY  09/26/2018   Procedure: BIOPSY;  Surgeon: Thornton Park, MD;  Location: [place];  Service: Gastroenterology;;  . BIOPSY  01/09/2019   Procedure: BIOPSY;  Surgeon: Irving Copas., MD;  Location: [place];  Service: Gastroenterology;;  . CESAREAN SECTION     x2  . DENTAL SURGERY     multiple extractions 3'15  . DILATION AND CURETTAGE OF UTERUS    . ESOPHAGOGASTRODUODENOSCOPY N/A 10/24/2013   Procedure: ESOPHAGOGASTRODUODENOSCOPY (EGD);  Surgeon: Gatha Mayer, MD;  Location: [facility];  Service: Endoscopy;  Laterality: N/A;  . ESOPHAGOGASTRODUODENOSCOPY N/A 01/09/2019   Procedure: ESOPHAGOGASTRODUODENOSCOPY (EGD);  Surgeon: Irving Copas., MD;  Location: [place];  Service: Gastroenterology;  Laterality: N/A;  . ESOPHAGOGASTRODUODENOSCOPY (EGD) WITH PROPOFOL N/A 09/26/2018   Procedure: ESOPHAGOGASTRODUODENOSCOPY (EGD) WITH PROPOFOL;  Surgeon: Thornton Park, MD;  Location: [place];  Service: Gastroenterology;  Laterality: N/A;  . IR PARACENTESIS  09/23/2018  . LIVER BIOPSY  06/30/2012   Procedure: LIVER BIOPSY;  Surgeon: Shann Medal, MD;  Location[facility];  Service: General;;  . NOVASURE ABLATION    . SHOULDER ARTHROSCOPY Right 2011  . UMBILICAL HERNIA REPAIR N/A 06/30/2012   Procedure: remove umbilicus;  Surgeon: Shann Medal, MD;  Location[facility];  Service: General;  Laterality: N/A;  . UPPER ESOPHAGEAL ENDOSCOPIC ULTRASOUND (EUS) N/A 01/09/2019   Procedure: UPPER ESOPHAGEAL ENDOSCOPIC ULTRASOUND (EUS);  Surgeon: Irving Copas., MD;  Location: [place];  Service: Gastroenterology;  Laterality: N/A;  . VENTRAL HERNIA REPAIR N/A 06/30/2012   Procedure: LAPAROSCOPIC VENTRAL HERNIA;  Surgeon: Shann Medal, MD;  [facility];  Service: General;  Laterality: N/A;  With Mesh  . VIDEO  BRONCHOSCOPY Bilateral 07/20/2018   Procedure: VIDEO BRONCHOSCOPY WITHOUT FLUORO;  Surgeon: Brand Males, MD;  [facility];  Service: Cardiopulmonary;  Laterality: Bilateral;  . WISDOM TOOTH EXTRACTION      MEDICATIONS: No current facility-administered medications for this encounter.   Marland Kitchen albuterol (PROAIR HFA) 108 (90 Base) MCG/ACT inhaler  . albuterol (PROVENTIL) (2.5 MG/3ML) 0.083% nebulizer solution  . budesonide-formoterol (SYMBICORT) 80-4.5 MCG/ACT inhaler  . cetirizine (ZYRTEC) 10 MG tablet  . doxycycline (VIBRA-TABS) 100 MG tablet  . fluticasone (FLONASE) 50 MCG/ACT nasal spray  . guaiFENesin  (MUCINEX) 600 MG 12 hr tablet  . levothyroxine (SYNTHROID) 125 MCG tablet  . metFORMIN (GLUCOPHAGE) 500 MG tablet  . OXYGEN  . pantoprazole (PROTONIX) 40 MG tablet  . predniSONE (DELTASONE) 10 MG tablet  . SPIRIVA HANDIHALER 18 MCG inhalation capsule  . ursodiol (ACTIGALL) 500 MG tablet  . acetaminophen (TYLENOL) 500 MG tablet  . blood glucose meter kit and supplies  . glucose blood (TRUE METRIX BLOOD GLUCOSE TEST) test strip  . ketoconazole (NIZORAL) 2 % cream  . tamsulosin (FLOMAX) 0.4 MG CAPS capsule  . TRUEplus Lancets 28G MISC   Not currently taking ketoconazole cream, tamsulosin. Prednisone taper ended 04/21/19.     Myra Gianotti, PA-C Surgical Short Stay/Anesthesiology [facility] Phone [phone] [facility] Phone [phone] 04/25/2019 6:07 PM

## 2019-04-26 ENCOUNTER — Ambulatory Visit (HOSPITAL_COMMUNITY): Payer: Medicaid Other | Admitting: Vascular Surgery

## 2019-04-26 ENCOUNTER — Encounter (HOSPITAL_COMMUNITY)
Admission: RE | Disposition: A | Discharge status: Home / Self Care | Payer: Self-pay | Source: Home / Self Care | Attending: Gastroenterology

## 2019-04-26 ENCOUNTER — Encounter (HOSPITAL_COMMUNITY): Payer: Self-pay | Admitting: Gastroenterology

## 2019-04-26 ENCOUNTER — Ambulatory Visit (HOSPITAL_COMMUNITY)
Admission: RE | Admit: 2019-04-26 | Discharge: 2019-04-26 | Disposition: A | Discharge status: Home / Self Care | Payer: Medicaid Other | Attending: Gastroenterology | Admitting: Gastroenterology

## 2019-04-26 ENCOUNTER — Other Ambulatory Visit: Payer: Self-pay

## 2019-04-26 DIAGNOSIS — M1990 Unspecified osteoarthritis, unspecified site: Secondary | ICD-10-CM | POA: Insufficient documentation

## 2019-04-26 DIAGNOSIS — Z886 Allergy status to analgesic agent status: Secondary | ICD-10-CM | POA: Diagnosis not present

## 2019-04-26 DIAGNOSIS — K219 Gastro-esophageal reflux disease without esophagitis: Secondary | ICD-10-CM | POA: Insufficient documentation

## 2019-04-26 DIAGNOSIS — K3189 Other diseases of stomach and duodenum: Secondary | ICD-10-CM | POA: Diagnosis not present

## 2019-04-26 DIAGNOSIS — K259 Gastric ulcer, unspecified as acute or chronic, without hemorrhage or perforation: Secondary | ICD-10-CM | POA: Insufficient documentation

## 2019-04-26 DIAGNOSIS — Z79890 Hormone replacement therapy (postmenopausal): Secondary | ICD-10-CM | POA: Insufficient documentation

## 2019-04-26 DIAGNOSIS — Z79899 Other long term (current) drug therapy: Secondary | ICD-10-CM | POA: Insufficient documentation

## 2019-04-26 DIAGNOSIS — Z7951 Long term (current) use of inhaled steroids: Secondary | ICD-10-CM | POA: Diagnosis not present

## 2019-04-26 DIAGNOSIS — Z7984 Long term (current) use of oral hypoglycemic drugs: Secondary | ICD-10-CM | POA: Diagnosis not present

## 2019-04-26 DIAGNOSIS — E039 Hypothyroidism, unspecified: Secondary | ICD-10-CM | POA: Insufficient documentation

## 2019-04-26 DIAGNOSIS — J849 Interstitial pulmonary disease, unspecified: Secondary | ICD-10-CM | POA: Diagnosis not present

## 2019-04-26 DIAGNOSIS — R233 Spontaneous ecchymoses: Secondary | ICD-10-CM | POA: Insufficient documentation

## 2019-04-26 DIAGNOSIS — I8500 Esophageal varices without bleeding: Secondary | ICD-10-CM | POA: Diagnosis present

## 2019-04-26 DIAGNOSIS — K7460 Unspecified cirrhosis of liver: Secondary | ICD-10-CM | POA: Insufficient documentation

## 2019-04-26 DIAGNOSIS — N189 Chronic kidney disease, unspecified: Secondary | ICD-10-CM | POA: Insufficient documentation

## 2019-04-26 DIAGNOSIS — F17210 Nicotine dependence, cigarettes, uncomplicated: Secondary | ICD-10-CM | POA: Insufficient documentation

## 2019-04-26 DIAGNOSIS — Z9981 Dependence on supplemental oxygen: Secondary | ICD-10-CM | POA: Insufficient documentation

## 2019-04-26 DIAGNOSIS — I864 Gastric varices: Secondary | ICD-10-CM | POA: Diagnosis not present

## 2019-04-26 DIAGNOSIS — K766 Portal hypertension: Secondary | ICD-10-CM

## 2019-04-26 DIAGNOSIS — I8510 Secondary esophageal varices without bleeding: Secondary | ICD-10-CM | POA: Diagnosis not present

## 2019-04-26 DIAGNOSIS — K743 Primary biliary cirrhosis: Secondary | ICD-10-CM | POA: Insufficient documentation

## 2019-04-26 DIAGNOSIS — E1122 Type 2 diabetes mellitus with diabetic chronic kidney disease: Secondary | ICD-10-CM | POA: Insufficient documentation

## 2019-04-26 DIAGNOSIS — Z792 Long term (current) use of antibiotics: Secondary | ICD-10-CM | POA: Insufficient documentation

## 2019-04-26 HISTORY — DX: Cardiac murmur, unspecified: R01.1

## 2019-04-26 HISTORY — DX: Cardiac arrhythmia, unspecified: I49.9

## 2019-04-26 HISTORY — PX: HEMOSTASIS CLIP PLACEMENT: SHX6857

## 2019-04-26 HISTORY — PX: SCLEROTHERAPY: SHX6841

## 2019-04-26 HISTORY — DX: Unspecified injury of head, initial encounter: S09.90XA

## 2019-04-26 HISTORY — PX: ESOPHAGOGASTRODUODENOSCOPY (EGD) WITH PROPOFOL: SHX5813

## 2019-04-26 HISTORY — DX: Personal history of urinary calculi: Z87.442

## 2019-04-26 LAB — POCT I-STAT, CHEM 8
BUN: 5 mg/dL — ABNORMAL LOW (ref 6–20)
Calcium, Ion: 1.16 mmol/L (ref 1.15–1.40)
Chloride: 111 mmol/L (ref 98–111)
Creatinine, Ser: 0.5 mg/dL (ref 0.44–1.00)
Glucose, Bld: 100 mg/dL — ABNORMAL HIGH (ref 70–99)
HCT: 29 % — ABNORMAL LOW (ref 36.0–46.0)
Hemoglobin: 9.9 g/dL — ABNORMAL LOW (ref 12.0–15.0)
Potassium: 3.8 mmol/L (ref 3.5–5.1)
Sodium: 141 mmol/L (ref 135–145)
TCO2: 21 mmol/L — ABNORMAL LOW (ref 22–32)

## 2019-04-26 LAB — PREPARE RBC (CROSSMATCH)

## 2019-04-26 LAB — GLUCOSE, CAPILLARY: Glucose-Capillary: 90 mg/dL (ref 70–99)

## 2019-04-26 SURGERY — ESOPHAGOGASTRODUODENOSCOPY (EGD) WITH PROPOFOL
Anesthesia: General

## 2019-04-26 MED ORDER — FENTANYL CITRATE (PF) 100 MCG/2ML IJ SOLN: 25.0000 ug | INTRAMUSCULAR | Status: DC | PRN

## 2019-04-26 MED ORDER — PHENYLEPHRINE HCL (PRESSORS) 10 MG/ML IV SOLN
INTRAVENOUS | Status: DC | PRN
  Administered 2019-04-26: 120 ug via INTRAVENOUS
  Administered 2019-04-26: 40 ug via INTRAVENOUS
  Administered 2019-04-26 (×2): 120 ug via INTRAVENOUS

## 2019-04-26 MED ORDER — CIPROFLOXACIN IN D5W 400 MG/200ML IV SOLN
INTRAVENOUS | Status: DC | PRN
  Administered 2019-04-26: 400 mg via INTRAVENOUS

## 2019-04-26 MED ORDER — LACTATED RINGERS IV SOLN: INTRAVENOUS | Status: DC

## 2019-04-26 MED ORDER — FENTANYL CITRATE (PF) 100 MCG/2ML IJ SOLN
INTRAMUSCULAR | Status: AC
  Filled 2019-04-26: qty 2

## 2019-04-26 MED ORDER — CIPROFLOXACIN HCL 500 MG PO TABS: 500.0000 mg | ORAL_TABLET | Freq: Two times a day (BID) | ORAL | 0 refills | Status: AC

## 2019-04-26 MED ORDER — SODIUM CHLORIDE 0.9 % IV SOLN: INTRAVENOUS | Status: DC

## 2019-04-26 MED ORDER — ALBUTEROL SULFATE HFA 108 (90 BASE) MCG/ACT IN AERS
INHALATION_SPRAY | RESPIRATORY_TRACT | Status: DC | PRN
  Administered 2019-04-26: 4 via RESPIRATORY_TRACT

## 2019-04-26 MED ORDER — SUGAMMADEX SODIUM 200 MG/2ML IV SOLN
INTRAVENOUS | Status: DC | PRN
  Administered 2019-04-26: 300 mg via INTRAVENOUS

## 2019-04-26 MED ORDER — CIPROFLOXACIN IN D5W 400 MG/200ML IV SOLN
INTRAVENOUS | Status: AC
  Filled 2019-04-26: qty 200

## 2019-04-26 MED ORDER — ONDANSETRON 4 MG PO TBDP: 4.0000 mg | ORAL_TABLET | Freq: Three times a day (TID) | ORAL | 0 refills | Status: AC | PRN

## 2019-04-26 MED ORDER — ONDANSETRON 4 MG PO TBDP: 4.0000 mg | ORAL_TABLET | Freq: Three times a day (TID) | ORAL | 0 refills | Status: DC | PRN

## 2019-04-26 MED ORDER — LIDOCAINE 2% (20 MG/ML) 5 ML SYRINGE
INTRAMUSCULAR | Status: DC | PRN
  Administered 2019-04-26: 100 mg via INTRAVENOUS

## 2019-04-26 MED ORDER — FENTANYL CITRATE (PF) 100 MCG/2ML IJ SOLN
INTRAMUSCULAR | Status: DC | PRN
  Administered 2019-04-26 (×2): 50 ug via INTRAVENOUS

## 2019-04-26 MED ORDER — PHENYLEPHRINE HCL-NACL 10-0.9 MG/250ML-% IV SOLN
INTRAVENOUS | Status: DC | PRN
  Administered 2019-04-26: 40 ug/min via INTRAVENOUS

## 2019-04-26 MED ORDER — PROPOFOL 10 MG/ML IV BOLUS
INTRAVENOUS | Status: DC | PRN
  Administered 2019-04-26: 150 mg via INTRAVENOUS
  Administered 2019-04-26: 50 mg via INTRAVENOUS

## 2019-04-26 MED ORDER — ROCURONIUM BROMIDE 50 MG/5ML IV SOSY
PREFILLED_SYRINGE | INTRAVENOUS | Status: DC | PRN
  Administered 2019-04-26: 30 mg via INTRAVENOUS

## 2019-04-26 MED ORDER — SODIUM CHLORIDE 0.9% IV SOLUTION: Freq: Once | INTRAVENOUS | Status: DC

## 2019-04-26 MED ORDER — ONDANSETRON HCL 4 MG/2ML IJ SOLN
INTRAMUSCULAR | Status: DC | PRN
  Administered 2019-04-26: 4 mg via INTRAVENOUS

## 2019-04-26 MED ORDER — SUCCINYLCHOLINE CHLORIDE 20 MG/ML IJ SOLN
INTRAMUSCULAR | Status: DC | PRN
  Administered 2019-04-26: 140 mg via INTRAVENOUS

## 2019-04-26 MED ORDER — LACTATED RINGERS IV SOLN: INTRAVENOUS | Status: DC | PRN

## 2019-04-26 MED ORDER — DEXAMETHASONE SODIUM PHOSPHATE 10 MG/ML IJ SOLN
INTRAMUSCULAR | Status: DC | PRN
  Administered 2019-04-26: 4 mg via INTRAVENOUS

## 2019-04-26 MED FILL — ONDANSETRON ODT 4 MG TABLET: 4 | 3 days supply | Qty: 18 | Fill #0

## 2019-04-26 SURGICAL SUPPLY — 15 items

## 2019-04-26 NOTE — Anesthesia Postprocedure Evaluation (Signed)
Anesthesia Post Note  Patient: Holly Mueller  Procedure(s) Performed: ESOPHAGOGASTRODUODENOSCOPY (EGD) WITH PROPOFOL (N/A ) [place]     Patient location during evaluation: Endoscopy Anesthesia Type: General Level of consciousness: awake Pain management: pain level controlled Vital Signs Assessment: post-procedure vital signs reviewed and stable Respiratory status: spontaneous breathing Cardiovascular status: stable Postop Assessment: no apparent nausea or vomiting Anesthetic complications: no    Last Vitals:  Vitals:   04/26/19 0840 04/26/19 1058  BP: 126/67 (!) 153/78  Pulse: 90 91  Resp: 20 (!) 22  Temp: 36.9 C 36.9 C  SpO2: 93% 95%    Last Pain:  Vitals:   04/26/19 1058  TempSrc: Temporal  PainSc: 0-No pain                 Teauna Dubach

## 2019-04-26 NOTE — Progress Notes (Signed)
Patient has known telangiectasias on her upper thorax/back. After waking up from procedure while bein gin reocvery noted some increased petechia on face and on the lueft upper thorax and left shoulder region, in area of where she had been positioned. Seems to be positional petechia from her positioning for her procedure today. Will monitor closely. She is having some coughing without nausea. Will monitor. Will need to have antiemetics available for patient as well at time of discharge, I am sending her home with Zofran when she is stable. Liquid diet today only. Updated patient's sister on her current status. Hemodynamically is stable, but may need some respiratory/airway bronchodilation, will see how she does.   Justice Britain, MD [facility] Office # [phone]

## 2019-04-26 NOTE — Discharge Instructions (Signed)
YOU HAD AN ENDOSCOPIC PROCEDURE TODAY: Refer to the procedure report and other information in the discharge instructions given to you for any specific questions about what was found during the examination. If this information does not answer your questions, please call [place] office at [phone] to clarify.   YOU SHOULD EXPECT: Some feelings of bloating in the abdomen. Passage of more gas than usual. Walking can help get rid of the air that was put into your GI tract during the procedure and reduce the bloating. If you had a lower endoscopy (such as a colonoscopy or flexible sigmoidoscopy) you may notice spotting of blood in your stool or on the toilet paper. Some abdominal soreness may be present for a day or two, also.  DIET: Your first meal following the procedure should be a light meal and then it is ok to progress to your normal diet. A half-sandwich or bowl of soup is an example of a good first meal. Heavy or fried foods are harder to digest and may make you feel nauseous or bloated. Drink plenty of fluids but you should avoid alcoholic beverages for 24 hours. If you had a esophageal dilation, please see attached instructions for diet.    ACTIVITY: Your care partner should take you home directly after the procedure. You should plan to take it easy, moving slowly for the rest of the day. You can resume normal activity the day after the procedure however YOU SHOULD NOT DRIVE, use power tools, machinery or perform tasks that involve climbing or major physical exertion for 24 hours (because of the sedation medicines used during the test).   SYMPTOMS TO REPORT IMMEDIATELY: A gastroenterologist can be reached at any hour. Please call [phone]  for any of the following symptoms:   Following upper endoscopy (EGD, EUS, ERCP, esophageal dilation) Vomiting of blood or coffee ground material  New, significant abdominal pain  New, significant chest pain or pain under the shoulder blades  Painful or  persistently difficult swallowing  New shortness of breath  Black, tarry-looking or red, bloody stools  FOLLOW UP:  If any biopsies were taken you will be contacted by phone or by letter within the next 1-3 weeks. Call [phone]  if you have not heard about the biopsies in 3 weeks.  Please also call with any specific questions about appointments or follow up tests.  

## 2019-04-26 NOTE — H&P (Signed)
GASTROENTEROLOGY PROCEDURE H&P NOTE   Primary Care Physician: Cleophas Dunker, DO  HPI: Holly Mueller is a 53 y.o. female who presents for EGD with attempt at Gastric Varix obliteration via Glue injection.  Past Medical History:  Diagnosis Date  . Acute respiratory failure with hypoxia ([place]) 09/22/2018  . Anxiety    "anxiety attacks- sometimes"  . Arthritis    bursitis left hip flares-not an issue  . Chronic kidney disease   . Community acquired pneumonia 11/28/2018  . Diabetes mellitus without complication ([place])    Type II  . Dysrhythmia   . Edentulous    10-19-13 at present  . Epilepsy ([place]) in 1972   No seizures since 1972. Previously treated with phenobarbital.   . Gastric varices with bleeding   . GERD (gastroesophageal reflux disease) 2008  . 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 Headache(784.0)    hx of migraines   . Heart murmur   . 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 History of blood transfusion    gi bleed  . History of kidney stones    passed  . Hypothyroidism 2008  . Interstitial lung disease ([place])   . Leukopenia   . Lower esophageal ring 08/18/2013  . Pneumonia   . Primary biliary cholangitis ([place]) 10/24/2018  . Seasonal allergies 2003  . SEIZURES, HX OF 12/12/2009   Annotation: last seizure in early 1970s Qualifier: Diagnosis of  By: Carlena Sax  MD, Colletta [place]    . Shortness of breath    walking  . Wears glasses    Past Surgical History:  Procedure Laterality Date  . ABDOMINAL HYSTERECTOMY     " partial "  . BALLOON DILATION N/A 10/24/2013   Procedure: BALLOON DILATION;  Surgeon: Gatha Mayer, MD;  Location: [facility];  Service: Endoscopy;  Laterality: N/A;  . BIOPSY  09/26/2018   Procedure: BIOPSY;  Surgeon: Thornton Park, MD;  Location: [place];  Service: Gastroenterology;;  . BIOPSY  01/09/2019   Procedure: BIOPSY;  Surgeon: Irving Copas., MD;  Location: [place];  Service: Gastroenterology;;  . CESAREAN SECTION     x2  . DENTAL SURGERY     multiple extractions 3'15  . DILATION AND CURETTAGE OF UTERUS    . ESOPHAGOGASTRODUODENOSCOPY N/A 10/24/2013   Procedure: ESOPHAGOGASTRODUODENOSCOPY (EGD);  Surgeon: Gatha Mayer, MD;  Location: [facility];  Service: Endoscopy;  Laterality: N/A;  . ESOPHAGOGASTRODUODENOSCOPY N/A 01/09/2019   Procedure: ESOPHAGOGASTRODUODENOSCOPY (EGD);  Surgeon: Irving Copas., MD;  Location: [place];  Service: Gastroenterology;  Laterality: N/A;  . ESOPHAGOGASTRODUODENOSCOPY (EGD) WITH PROPOFOL N/A 09/26/2018   Procedure: ESOPHAGOGASTRODUODENOSCOPY (EGD) WITH PROPOFOL;  Surgeon: Thornton Park, MD;  Location: [place];  Service: Gastroenterology;  Laterality: N/A;  . IR PARACENTESIS  09/23/2018  . LIVER BIOPSY  06/30/2012   Procedure: LIVER BIOPSY;  Surgeon: Shann Medal, MD;  [facility];  Service: General;;  . NOVASURE ABLATION    . SHOULDER ARTHROSCOPY Right 2011  . UMBILICAL HERNIA REPAIR N/A 06/30/2012   Procedure: remove umbilicus;  Surgeon: Shann Medal, MD;  Location: [facility];  Service: General;  Laterality: N/A;  . UPPER ESOPHAGEAL ENDOSCOPIC ULTRASOUND (EUS) N/A 01/09/2019   Procedure: UPPER ESOPHAGEAL ENDOSCOPIC ULTRASOUND (EUS);  Surgeon: Irving Copas., MD;  Location: [place];  Service: Gastroenterology;  Laterality: N/A;  . VENTRAL HERNIA REPAIR N/A 06/30/2012   Procedure: LAPAROSCOPIC  VENTRAL HERNIA;  Surgeon: Shann Medal, MD;  [facility];  Service: General;  Laterality: N/A;  With Mesh  . VIDEO BRONCHOSCOPY Bilateral 07/20/2018   Procedure: VIDEO BRONCHOSCOPY WITHOUT FLUORO;  Surgeon: Brand Males, MD;  [facility];  Service: Cardiopulmonary;  Laterality: Bilateral;  . WISDOM TOOTH EXTRACTION     No current facility-administered medications for this encounter.   Allergies  Allergen Reactions  . Aspirin Nausea Only     Upset stomach   Family History  Problem Relation Age of Onset  . Hypertension Mother   . Diabetes Mother   . Cirrhosis Mother        ? medications  . Lung cancer Father   . Stroke Father   . Diabetes Sister   . Hypertension Sister   . Colon cancer Neg Hx   . Esophageal cancer Neg Hx   . Rectal cancer Neg Hx    Social History   Socioeconomic History  . Marital status: Single    Spouse name: Not on file  . Number of children: 2  . Years of education: Not on file  . Highest education level: Not on file  Occupational History  . Occupation: unemployed  Tobacco Use  . Smoking status: Current Every Day Smoker    Packs/day: 0.25  . Smokeless tobacco: Never Used  . Tobacco comment: 04/25/2019- "I smoke part of a cigarette and put it out,I did not like it."  Substance and Sexual Activity  . Alcohol use: Not Currently  . Drug use: No  . Sexual activity: Not Currently  Other Topics Concern  . 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 Difficulty of Paying Living Expenses: Very hard  Food Insecurity:   . Worried About Charity fundraiser in the Last Year: Not on file  . Ran Out of Food in the Last Year: Not on file  Transportation Needs:   . Lack of Transportation (Medical): Not on file  . Lack of Transportation (Non-Medical): Not on file  Physical Activity:   . Days of Exercise per Week: Not on file  . Minutes of Exercise per Session: Not on file  Stress:   . Feeling of Stress : Not on file  Social Connections:   . Frequency of Communication with Friends and Family: Not on file  . Frequency of Social Gatherings with Friends and Family: Not on file  . Attends Religious Services: Not on file  . Active Member of Clubs or  Organizations: Not on file  . Attends Archivist Meetings: Not on file  . Marital Status: Not on file  Intimate Partner Violence:   . Fear of Current or Ex-Partner: Not on file  . Emotionally Abused: Not on file  . Physically Abused: Not on file  . Sexually Abused: Not on file    Physical Exam: Vital signs in last 24 hours: Weight:  [115.7 kg] 115.7 kg (12/15 1801)   GEN: NAD EYE: Sclerae anicteric ENT: MMM CV: Non-tachycardic GI: Soft, protuberant abdomen NEURO:  Alert & Oriented x 3  Lab Results: No results for input(s): WBC, HGB, HCT, PLT in the last 72 hours. BMET No results for  input(s): NA, K, CL, CO2, GLUCOSE, BUN, CREATININE, CALCIUM in the last 72 hours. LFT No results for input(s): PROT, ALBUMIN, AST, ALT, ALKPHOS, BILITOT, BILIDIR, IBILI in the last 72 hours. PT/INR No results for input(s): LABPROT, INR in the last 72 hours.   Impression / Plan: This is a 53 y.o.female who presents for EGD with attempt at Gastric Varix obliteration via Glue injection.  The risks and benefits of endoscopic evaluation were discussed with the patient; these include but are not limited to the risk of perforation, infection, bleeding, missed lesions, lack of diagnosis, severe illness requiring hospitalization, as well as anesthesia and sedation related illnesses.  With the use of glue injection there are additional risks of systemic embolism, tissue necrosis, significant bleeding requiring IR intevention if unable to stop large volume bleeding.  With GV obliteration, there could be increased pressures in the esophageal varices, that could require banding in the near future as well.  The patient and family are agreeable to proceed.    Justice Britain, MD [facility] Advanced [facility]oscopy Office # [phone]

## 2019-04-26 NOTE — Op Note (Signed)
[facility] Patient Name: Holly Mueller Procedure Date : 04/26/2019 MRN: [id number] Attending MD: Justice Britain , MD Date of Birth: January 15, 1966 CSN: [id number] Age: 53 Admit Type: Inpatient Procedure:                Upper GI endoscopy Indications:              Follow-up of esophageal varices, Follow-up of                            gastric varices, For therapy of gastric varices Providers:                Justice Britain, MD, Carlyn Reichert, RN, Lazaro Arms, Technician Referring MD:             Gatha Mayer, MD Medicines:                General Anesthesia Complications:            No immediate complications. Estimated Blood Loss:     Estimated blood loss was minimal. Procedure:                Pre-Anesthesia Assessment:                           - Prior to the procedure, a History and Physical                            was performed, and patient medications and                            allergies were reviewed. The patient's tolerance of                            previous anesthesia was also reviewed. The risks                            and benefits of the procedure and the sedation                            options and risks were discussed with the patient.                            All questions were answered, and informed consent                            was obtained. Prior Anticoagulants: The patient has                            taken no previous anticoagulant or antiplatelet                            agents. ASA Grade Assessment: III - A patient with  severe systemic disease. After reviewing the risks                            and benefits, the patient was deemed in                            satisfactory condition to undergo the procedure.                           After obtaining informed consent, the endoscope was                            passed under direct vision. Throughout the                     procedure, the patient's blood pressure, pulse, and                            oxygen saturations were monitored continuously. The                            [id number]-[id number] ([id number]) Olympus therapeutic                            gastroscope was introduced through the mouth, and                            advanced to the second part of duodenum. The upper                            GI endoscopy was accomplished without difficulty.                            The patient tolerated the procedure. Scope In: Scope Out: Findings:      No gross lesions were noted in the proximal esophagus and in the mid       esophagus.      Grade II varices were found in the distal esophagus.      Type 1 isolated gastric varices (IGV1, varices located in the fundus)       with no bleeding were found in the gastric fundus. They were medium in       largest diameter. These had previously been treated. These were       successfully injected with 1 mL n-Butyl cyanoacrylate for eradication of       varices using standard method. Glue was noted to have hardened. No       evidence of persistent oozing/bleeding noted.      Mild portal hypertensive gastropathy was found in the gastric body.      A mucosal tear was found on the greater curvature of the gastric body.       This was small in size. Most likely this was a result of retroflexion.       There was some oozing initially, but while getting setup for Glue       Injection, this subsided. For additional hemostasis and to close the       area, two hemostatic clips were  successfully placed (MR conditional).       There was no bleeding at the end of the procedure.      Localized nodular erosions were found in the gastric antrum - previously       biopsied and negative for HP.Marland Kitchen      No other gross lesions were noted in the entire examined stomach.      No gross lesions were noted in the duodenal bulb, in the first portion       of the duodenum and in the  second portion of the duodenum. Impression:               - No gross lesions in esophagus.                           - Grade II esophageal varices.                           - Type 1 isolated gastric varices (IGV1, varices                            located in the fundus), without bleeding. Injected                            for treatment with success.                           - Portal hypertensive gastropathy.                           - Mucosal tear on the greater curvature of the                            gastric body. Clips (MR conditional) were placed.                           - Nodular mucosa in the gastric antrum.                           - No gross lesions in the duodenal bulb, in the                            first portion of the duodenum and in the second                            portion of the duodenum. Recommendation:           - The patient will be observed post-procedure,                            until all discharge criteria are met.                           - Discharge patient to home.                           - Patient has a contact number available  for                            emergencies. The signs and symptoms of potential                            delayed complications were discussed with the                            patient. Return to normal activities tomorrow.                            Written discharge instructions were provided to the                            patient.                           - Full liquid diet today into tomorrow AM. If doing                            well then soft diet x 1-week.                           - Continue present medications.                           - Minimize NSAID use as able.                           - Repeat EUS with possible repeat Glue injection in                            3-4 months. Will discuss with Dr. Carlean Purl, role of                            BB vs early repeat EGD in case Esophageal variceal                             banding were to be required if increased pressures                            are now noted in that region.                           - Ciprofloxacin 500 mg BID x 1-week to decrease                            risk of post-infectious complications in Cirrhotic.                           - Monitor for signs/symptoms of bleeding,                            perforation, embolization, and  infection. If issues                            please call our number to get further assistance as                            needed and likely admission to the hospital.                           - The findings and recommendations were discussed                            with the patient.                           - The findings and recommendations were discussed                            with the patient's family. Procedure Code(s):        --- Professional ---                           [phone], Esophagogastroduodenoscopy, flexible,                            transoral; with injection sclerosis of                            esophageal/gastric varices Diagnosis Code(s):        --- Professional ---                           I85.00, Esophageal varices without bleeding                           I86.4, Gastric varices                           K76.6, Portal hypertension                           K31.89, Other diseases of stomach and duodenum                           S36.39XA, Other injury of stomach, initial encounter CPT copyright 2019 American Medical Association. All rights reserved. The codes documented in this report are preliminary and upon coder review may  be revised to meet current compliance requirements. Justice Britain, MD 04/26/2019 11:13:55 AM Number of Addenda: 0

## 2019-04-26 NOTE — Anesthesia Procedure Notes (Signed)
Procedure Name: Intubation Date/Time: 04/26/2019 9:43 AM Performed by: Inda Coke, CRNA Pre-anesthesia Checklist: Patient identified, Emergency Drugs available, Suction available and Patient being monitored Patient Re-evaluated:Patient Re-evaluated prior to induction Oxygen Delivery Method: Circle System Utilized Preoxygenation: Pre-oxygenation with 100% oxygen Induction Type: IV induction Ventilation: Mask ventilation without difficulty Laryngoscope Size: Mac and 3 Grade View: Grade I Tube type: Oral Tube size: 7.5 mm Number of attempts: 1 Airway Equipment and Method: Stylet and Oral airway Placement Confirmation: ETT inserted through vocal cords under direct vision,  positive ETCO2 and breath sounds checked- equal and bilateral Secured at: 22 cm Tube secured with: Tape Dental Injury: Teeth and Oropharynx as per pre-operative assessment

## 2019-04-26 NOTE — Transfer of Care (Signed)
Immediate Anesthesia Transfer of Care Note  Patient: Holly Mueller  Procedure(s) Performed: ESOPHAGOGASTRODUODENOSCOPY (EGD) WITH PROPOFOL (N/A ) SCLEROTHERAPY HEMOSTASIS CLIP PLACEMENT  Patient Location: PACU and Endoscopy Unit  Anesthesia Type:General  Level of Consciousness: awake and drowsy  Airway & Oxygen Therapy: Patient Spontanous Breathing and Patient connected to face mask oxygen  Post-op Assessment: Report given to RN and Post -op Vital signs reviewed and stable  Post vital signs: Reviewed and stable  Last Vitals:  Vitals Value Taken Time  BP    Temp    Pulse 94 04/26/19 1102  Resp 19 04/26/19 1102  SpO2 97 % 04/26/19 1102  Vitals shown include unvalidated device data.  Last Pain:  Vitals:   04/26/19 0840  TempSrc: Oral  PainSc: 0-No pain         Complications: No apparent anesthesia complications

## 2019-04-27 ENCOUNTER — Telehealth: Payer: Self-pay | Admitting: Internal Medicine

## 2019-04-27 DIAGNOSIS — K743 Primary biliary cirrhosis: Secondary | ICD-10-CM

## 2019-04-27 DIAGNOSIS — R21 Rash and other nonspecific skin eruption: Secondary | ICD-10-CM

## 2019-04-27 LAB — BPAM RBC
Blood Product Expiration Date: 202101192359
Blood Product Expiration Date: 202101192359
Unit Type and Rh: 5100
Unit Type and Rh: 5100

## 2019-04-27 LAB — TYPE AND SCREEN
ABO/RH(D): O POS
Antibody Screen: POSITIVE
Donor AG Type: NEGATIVE
Donor AG Type: NEGATIVE
PT AG Type: NEGATIVE
Unit division: 0
Unit division: 0

## 2019-04-27 NOTE — Telephone Encounter (Signed)
The pt had an EGD yesterday and she states she has a rash on the left side of her face, neck and side.  She also says the "whites of my eyes" are red.  The rash does not itch and is not raised.  She says it looks like small dots all over her face and neck.  Please advise

## 2019-04-27 NOTE — Telephone Encounter (Signed)
I tried to reach patient and left a voice message.  I'll see if I can reach her before end of the day today. Yesterday after her procedure had been completed, we noted a petechial rash on just her left side of face and her neck and left shoulder region, where she had been placed into position for her procedure. She had no other symptoms and was able to be discharged. We thought this was a result of likely low PLT count and being in her left lateral position for a period in time. Reaction to Cyanoacrylate was felt to be less likely, since she has had this type of medication previously, earlier this year at [facility]. She had her eyes and face covered as per protocol. She was intubated for the procedure as per standard Anesthesia protocol. To be an allergic reaction from these medications (Lipoidal or Cyanoacrylate) without other systemic symptoms seems unlikely but never impossible - but why to only have in those areas? A venous embolism of glue, would seem very unlikely to be in all of those locations and we used less than 1 cc overall of glue. I think she will benefit from Labs including a CBC with differential, CMP, ESR, CRP and looks like a follow up with her Family medicine doctor is set for tomorrow in case any medications/creams may need to be considered. I think a Dermatology referral is reasonable as well and I will discuss that with her if I can get a hold of her I suspect this will be pressure-related changes in positioning in setting of thrombocytopenia, but we will have to see. I will want to see what her LFTs look like as well in regards to the color change in her eyes. I will call her again this afternoon.  Justice Britain, MD [facility] Advanced [facility]oscopy Office # [phone]

## 2019-04-27 NOTE — Telephone Encounter (Signed)
I called and spoke with patient this afternoon. She states that the "little red dots" she noted on her left face have persisted and may be slightly more on her right side of face today. Her eyes yesterday evening were more swollen and red but today are slightly better. She is having watery eyes as well with some mild dryness to things there. Her "rash" has not moved from her neck or herleft upper shoulder region. With this being said, her sister felt that things were worse today and reason for calling today. She denies any issues with breathing. She has coughing which she attributes to the ETT that was in place but no sore throat. I do not know the etiology of her symptoms at this time.  I don't think this is an allergic reaction but we are going to let her take 25 mg of Benadryl at night (she says she gets sleepy when she has used in the past), so will try at bedtime and she is going to have sister get medication. I also asked her to get Natural Tears/Restasis to try and put in her eyes 2 times daily in case there is dryness from the eyes being shut during her procedure yesterday. I am interested to know what her PLT count is and the labs that were previous discussed are currently pending. Why she would have some sort of vasculitis is not clear to me, but inflammatory markers are ordered as well. I hope that her PCP office may have some sense of what this could be when they evaluate her tomorrow morning. I still suspect this is thrombocytopenia related.  I think we need Dermatology to help [place] so we will place the Urgent/ASAP referral in at this time in case a biopsy were to be required - Advanced RN Gerarda Fraction, please place this request.  If she develops sores or progressive movement of this rash to other portions of her body, she knows that she needs to go to the hospital to get things further defined.   I will relay this to her primary GI and to her PCP/MD seeing her tomorrow. I will be away most  of the next 2-weeks, but checking Epic periodically.   Justice Britain, MD [facility] Advanced Endoscopy Office # [phone]

## 2019-04-27 NOTE — Telephone Encounter (Signed)
Labs in [employer]

## 2019-04-27 NOTE — Telephone Encounter (Signed)
Referral has been made and records faxed.

## 2019-04-28 ENCOUNTER — Encounter: Payer: Self-pay | Admitting: Family Medicine

## 2019-04-28 ENCOUNTER — Ambulatory Visit (INDEPENDENT_AMBULATORY_CARE_PROVIDER_SITE_OTHER): Payer: Medicaid Other | Admitting: Family Medicine

## 2019-04-28 ENCOUNTER — Other Ambulatory Visit: Payer: Self-pay

## 2019-04-28 VITALS — BP 114/62 | HR 84 | Wt 251.4 lb

## 2019-04-28 DIAGNOSIS — R233 Spontaneous ecchymoses: Secondary | ICD-10-CM

## 2019-04-28 DIAGNOSIS — R600 Localized edema: Secondary | ICD-10-CM

## 2019-04-28 DIAGNOSIS — D61818 Other pancytopenia: Secondary | ICD-10-CM | POA: Diagnosis not present

## 2019-04-28 LAB — POCT UA - MICROSCOPIC ONLY

## 2019-04-28 LAB — POCT URINALYSIS DIP (MANUAL ENTRY)
Bilirubin, UA: NEGATIVE
Glucose, UA: NEGATIVE mg/dL
Ketones, POC UA: NEGATIVE mg/dL
Leukocytes, UA: NEGATIVE
Nitrite, UA: NEGATIVE
Protein Ur, POC: NEGATIVE mg/dL
Spec Grav, UA: >=1.03 — AB (ref 1.010–1.025)
Urobilinogen, UA: 1 E.U./dL
pH, UA: 6.5 (ref 5.0–8.0)

## 2019-04-28 LAB — POCT UA - MICROALBUMIN
Creatinine, POC: 50 mg/dL
Microalbumin Ur, POC: 30 mg/L

## 2019-04-28 NOTE — Telephone Encounter (Signed)
She is improving, I spoke to her She had labs at PCP  I will f/u

## 2019-04-28 NOTE — Patient Instructions (Signed)
It was nice to see you today  I will obtain the lab results that you are GI doctor previously ordered.  I have also put in another referral for hematology, so that you can reestablish care with them.  For the swelling in your legs you should wear your compression stockings.  I will also get some urine lab work, but it looks like your kidney function is normal based on previous labs.  Have a great day,  Clemetine Marker, MD

## 2019-04-28 NOTE — Progress Notes (Signed)
[facility] Phone: [phone]     Holly Mueller - 53 y.o. female MRN [id number]  Date of birth: [birth year]  Subjective:   cc: rash on face, foot swelling  HPI:   Rash:  Happened after her EGD procedure.  Rash appeared on left side of face and shoulder. It does not itch or hurt.   It is improving in appearance since that time. States her Eye lid was 'cherry red' on Wednesday.  GI referred her to a dermatologist for this and asked her to try some benadryl. Hasn't tried benadryl yet.    Used to go to the cancer center to get blood work a few years ago for her pancytopenia but Lost her medicaid and did not return.     Patient states the rash near her axillae have not improved . She used the cream prescribed before and changed her detergent and soap to hypoallergenic brands.   Foot swelling: since the week of thanksgiving.  Left foot stays swollen.  Doesn't hurt.  Has been 'off and on it' since then.  Feels 'tight' around her ankles.  Stays puffy even after she raises her foot up.     ROS: See HPI for pertinent positives and negatives  Family history reviewed for today's visit. No changes.  -  reports that she has never smoked. She has never used smokeless tobacco.  Objective:   BP 114/62   Pulse 84   Wt 251 lb 6.4 oz (114 kg)   LMP  (LMP Unknown)   SpO2 97%   BMI 40.58 kg/m  Gen: no acute distress.  Alert and oriented. Obese white middle aged female.  Appears older than stated age.  HEENT: petechiae located around the left orbital region, most prominent near the upper eyelid.  Moist oral mucosa.  No petechaie in the oral cavity.   Neck: no thyromegaly CV: regular rate and rhythm. Normal radial pulses.  Resp: LCTAB.  No wheezes or crackles. No tachypnea.  Ext: 1+ pitting edema on the left leg.  Trace pitting edema on right leg.  Legs symmetrical.  No TTP of the calves.  Skin: petechiae on LUE, most prominent on the shoulder.    Assessment/Plan:    Petechiae Seen on left side of face and L upper extremity two days after having and EGD procedure for varices in which she was lying on her left side.  On exam, it appears to be petechaie which would be consistent with her thrombocytopenia and liver disease.  Appears to be improving.  - will order labs that were previously ordered by GI.   - pt had been referred to derm by GI     Pancytopenia (HCC) Leukocyte and thrombocyte predominant.  Was previously seen by hematology for this a year ago, but stopped going to f/u appt d/t loss of insurance.  Previous note from heme stated they would f/u after GI determined the cause of her cirrhosis, which appears to be PBC.  Pancytopenia explains the petechial rash and may partially explain the LE edema d/t low oncotic pressure.   - draw and review labs ordered by GI - referral to heme/onc to re-establish care.    Lower extremity edema Not concerned for DVT based on physical exam and b/l nature of edema, even though left is slightly worse than right.  Likely explained by a combination of her cirrhosis and pancytopenia.  Kidney function was previously normal. Nevertheless, will get urinalysis and microalbumin to check for protein.  Clemetine Marker, MD PGY-2 [facility]

## 2019-04-29 LAB — CBC WITH DIFFERENTIAL
Basophils Absolute: 0 10*3/uL (ref 0.0–0.2)
Basos: 2 %
EOS (ABSOLUTE): 0.1 10*3/uL (ref 0.0–0.4)
Eos: 9 %
Hematocrit: 30.7 % — ABNORMAL LOW (ref 34.0–46.6)
Hemoglobin: 10.1 g/dL — ABNORMAL LOW (ref 11.1–15.9)
Immature Grans (Abs): 0 10*3/uL (ref 0.0–0.1)
Immature Granulocytes: 0 %
Lymphocytes Absolute: 0.3 10*3/uL — ABNORMAL LOW (ref 0.7–3.1)
Lymphs: 21 %
MCH: 28.6 pg (ref 26.6–33.0)
MCHC: 32.9 g/dL (ref 31.5–35.7)
MCV: 87 fL (ref 79–97)
Monocytes Absolute: 0.2 10*3/uL (ref 0.1–0.9)
Monocytes: 14 %
Neutrophils Absolute: 0.8 10*3/uL — ABNORMAL LOW (ref 1.4–7.0)
Neutrophils: 54 %
RBC: 3.53 x10E6/uL — ABNORMAL LOW (ref 3.77–5.28)
RDW: 14.8 % (ref 11.7–15.4)
WBC: 1.4 10*3/uL — CL (ref 3.4–10.8)

## 2019-04-29 LAB — COMPREHENSIVE METABOLIC PANEL
ALT: 26 IU/L (ref 0–32)
AST: 50 IU/L — ABNORMAL HIGH (ref 0–40)
Albumin/Globulin Ratio: 0.7 — ABNORMAL LOW (ref 1.2–2.2)
Albumin: 2.9 g/dL — ABNORMAL LOW (ref 3.8–4.9)
Alkaline Phosphatase: 161 IU/L — ABNORMAL HIGH (ref 39–117)
BUN/Creatinine Ratio: 14 (ref 9–23)
BUN: 10 mg/dL (ref 6–24)
Bilirubin Total: 1.3 mg/dL — ABNORMAL HIGH (ref 0.0–1.2)
CO2: 20 mmol/L (ref 20–29)
Calcium: 8.4 mg/dL — ABNORMAL LOW (ref 8.7–10.2)
Chloride: 109 mmol/L — ABNORMAL HIGH (ref 96–106)
Creatinine, Ser: 0.71 mg/dL (ref 0.57–1.00)
GFR calc Af Amer: 112 mL/min/{1.73_m2} (ref >59)
GFR calc non Af Amer: 97 mL/min/{1.73_m2} (ref >59)
Globulin, Total: 4.3 g/dL (ref 1.5–4.5)
Glucose: 96 mg/dL (ref 65–99)
Potassium: 3.7 mmol/L (ref 3.5–5.2)
Sodium: 142 mmol/L (ref 134–144)
Total Protein: 7.2 g/dL (ref 6.0–8.5)

## 2019-04-29 LAB — SEDIMENTATION RATE: Sed Rate: 42 mm/hr — ABNORMAL HIGH (ref 0–40)

## 2019-04-29 LAB — HIGH SENSITIVITY CRP: CRP, High Sensitivity: 3.92 mg/L — ABNORMAL HIGH (ref 0.00–3.00)

## 2019-05-01 DIAGNOSIS — R600 Localized edema: Secondary | ICD-10-CM | POA: Insufficient documentation

## 2019-05-01 DIAGNOSIS — R233 Spontaneous ecchymoses: Secondary | ICD-10-CM | POA: Insufficient documentation

## 2019-05-01 NOTE — Assessment & Plan Note (Addendum)
Leukocyte and thrombocyte predominant.  Was previously seen by hematology for this a year ago, but stopped going to f/u appt d/t loss of insurance.  Previous note from heme stated they would f/u after GI determined the cause of her cirrhosis, which appears to be PBC.  Pancytopenia explains the petechial rash and may partially explain the LE edema d/t low oncotic pressure.   - draw and review labs ordered by GI - referral to heme/onc to re-establish care.

## 2019-05-01 NOTE — Assessment & Plan Note (Signed)
Not concerned for DVT based on physical exam and b/l nature of edema, even though left is slightly worse than right.  Likely explained by a combination of her cirrhosis and pancytopenia.  Kidney function was previously normal. Nevertheless, will get urinalysis and microalbumin to check for protein.

## 2019-05-01 NOTE — Telephone Encounter (Signed)
CG, thanks for the update. GM

## 2019-05-01 NOTE — Assessment & Plan Note (Signed)
Seen on left side of face and L upper extremity two days after having and EGD procedure for varices in which she was lying on her left side.  On exam, it appears to be petechaie which would be consistent with her thrombocytopenia and liver disease.  Appears to be improving.  - will order labs that were previously ordered by GI.   - pt had been referred to derm by GI

## 2019-05-09 ENCOUNTER — Encounter: Payer: Self-pay | Admitting: Internal Medicine

## 2019-05-09 ENCOUNTER — Other Ambulatory Visit: Payer: Self-pay

## 2019-05-09 ENCOUNTER — Ambulatory Visit: Payer: Medicaid Other | Admitting: Internal Medicine

## 2019-05-09 ENCOUNTER — Telehealth: Payer: Self-pay | Admitting: Internal Medicine

## 2019-05-09 VITALS — BP 126/64 | HR 86 | Ht 66.0 in | Wt 243.4 lb

## 2019-05-09 DIAGNOSIS — R05 Cough: Secondary | ICD-10-CM | POA: Diagnosis not present

## 2019-05-09 DIAGNOSIS — J9611 Chronic respiratory failure with hypoxia: Secondary | ICD-10-CM | POA: Diagnosis not present

## 2019-05-09 DIAGNOSIS — R0600 Dyspnea, unspecified: Secondary | ICD-10-CM | POA: Diagnosis not present

## 2019-05-09 DIAGNOSIS — J849 Interstitial pulmonary disease, unspecified: Secondary | ICD-10-CM

## 2019-05-09 DIAGNOSIS — R053 Chronic cough: Secondary | ICD-10-CM

## 2019-05-09 NOTE — Telephone Encounter (Signed)
Hi Holly Mueller  On account of her cirrhosis,  have been hesistant to try pulmonary anti-fibrotics due to concern for liver injury - how reliable will LFT be in her to capture Ddrug induced liver injury (DILI) ?Marland Kitchen Does cirrhosis increase risk for DILI?  Also, ok for prednisone?  Certainly I Do not want to do cellcept or immuran due to low wbc/platelet?  Thanks    SIGNATURE    Dr. Brand Males, M.D., F.C.C.P,  Pulmonary and Critical Care Medicine Staff Physician, [facility] Director - Interstitial Lung Disease  Program  [facility] [place], [place], [address]  Pager: [phone], If no answer or between  15:00h - 7:00h: call [phone] Telephone: [phone]  1:03 PM 05/09/2019

## 2019-05-09 NOTE — Patient Instructions (Addendum)
ICD-10-CM   1. ILD (interstitial lung disease) ([place])  J84.9   2. Chronic respiratory failure with hypoxia (HCC)  J96.11   3. Dyspnea, unspecified type  R06.00   4. Chronic cough  R05    Disease slowly progressive Lot of symptoms Tough problem to address with underlying cirrhosis  Plan  - cotninue 02 as before  - see if [employer] will give you a lighter o2 if at all possible  - I will d/w Dr Carlean Purl about doing anti-fibrotic or immune suppression medicine for your fibrosis in setting of cirrhosis - I am afraid somewhat to do these medicines due to liver issues - get consejt for ILD PRO study - we will discuss more at followup or schedule a separate resarch visit  Followup  - 6-8 weeks do spirometrry/dlco  - return to see Dr Chase Caller in 30 min slot ILD clinic after test

## 2019-05-09 NOTE — Progress Notes (Signed)
HPI Oc 2017  : Mrs. Kydd is a 53 year old with past history of asthma. She has poor control over the past year and needed to go on several tapers of prednisone. She was seen in the ED in July 2017 for an exacerbation. She is currently maintained on Symbicort and albuterol. She needs to use her albuterol every day. She denies any nighttime awakenings. She also has significant issues with sinusitis, postnasal drip with constant clearing of the throat. She has heartburn and is on Prilosec.   Interim History Aug 2018: Seen in ED in July 2018 for dyspnea, cough, sinus and throat congestion. CTA did not show PE, there was mild atelectasis, GGO at base. PFTs do not show obstruction. There is restriction but no ILD on CT Today in the office she has chronic cough, no dyspnea or wheezing  She is in being evaluated for cytopenia. She's had a bone marrow biopsy which did not show any abnormality. She has a right upper quadrant ultrasound and a GI Eval Pending for Evaluation of Cirrhosis.   Interim History feb 2019: Admitted from 2/4-06/19/17 with fevers, diagnosed with flu.  Treated with Tamiflu.  She was seen by GI at that time for evaluation of cirrhosis and recommended outpatient follow-up with Dr. Carlean Purl. Hospitalized again from 2/19-2/24/19 with cellulitis, lower extremity pain.  Workup was negative for PE and DVT.  She was treated with Keflex.  CT showed stable mild groundglass opacities consistent with volume overload.    Reports that breathing is stable.  Still continues on Symbicort and albuterol.  Chief complaint today is left lower extremity pain.  There is no swelling, erythema, fevers, chills.     Interim History May 2019: Hospitalized again and early May for chest pain, dyspnea.  She had a stress test which is normal. Also seen by pulmonary for CTA that was negative for PE but showed some chronic lung opacities. Evaluated for aspiration.  Swallow eval was normal Serologies  shows positive ANA, SSA    Nov 2019: Mrs. Andrus is a 53 year old with past history of asthma, cirrhosis, cytopenia, restrictive lung disease. Seen in the ED in July 2017 for an exacerbation. She is currently maintained on Symbicort and albuterol. She needs to use her albuterol every day. She denies any nighttime awakenings. She also has significant issues with sinusitis, postnasal drip with constant clearing of the throat. She has heartburn and is on Prilosec.  Admitted from 2/4-06/19/17 with fevers, diagnosed with flu.  Treated with Tamiflu.  She was seen by GI at that time for evaluation of cirrhosis and recommended outpatient follow-up with Dr. Carlean Purl. Hospitalized 2/19-2/24/19 with cellulitis, lower extremity pain.  Workup was negative for PE and DVT.  She was treated with Keflex.  CT showed stable mild groundglass opacities consistent with volume overload.    Hospitalized again in May 2019 for chest pain, dyspnea.  She had a stress test which is normal. Also seen by pulmonary for CTA that was negative for PE but showed some chronic lung opacities. Evaluated for aspiration.  Swallow eval was normal Serologies shows positive ANA, SSA  She has seen Dr. Lebron Conners, hematology for cytopenia status post bone marrow biopsy.  Her low blood counts are felt to be secondary to cirrhosis.  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 Exposures: Had exposed to mold in the past.  No dampness, heart type, Jacuzzi Smoking history: Denies smoking.  She is exposed to secondhand smoke Travel history:  No significant travel history Relevant family history: No significant family history of lung disease.   Interim History: Nov 2019 Chronic cough continues with mild dyspnea on exertion. Referred to Dr. Chase Caller by Dr, Joya Gaskins, primary care for second opinion regarding interstitial lung disease.   OV 05/25/2018  Subjective:  Patient ID: Edilia Bo, female , DOB: 09-30-1965 ,  age 67 y.o. , MRN: [id number] , ADDRESS: [place] [address]   05/25/2018 -   Chief Complaint  Patient presents with  . Follow-up    Pt states things have been worse for her since last visit. Pt has complaints of SOB which happens almost at any time, has an occ cough with white foamy mucus, and also has some complaints of CP.     HPI SHERBY MONCAYO 53 y.o. -referred by Dr. Asencion Noble and Dr. Marshell Garfinkel for interstitial lung disease.  Is a very complex history  Much of the work-up is already been done.  The time spent was in reviewing the chart and talking to the patient.  It appears she has idiopathic cirrhosis [reviewed and confirmed to 2014 or 2015 liver biopsy showing cirrhosis with active inflammation].  She does not follow-up with a GI doctor.  Her platelets run around 50-60000.  She sees Dr. Neldon Mc for allergy.  She used to be on asthma inhaler but following repeated normal nitric oxide her Symbicort was stopped maybe approximately over a year ago.  She is not sure if the cough is worse after that.    Her current problem seems to be dyspnea on exertion for the last few to several years progressively getting worse.  She also has constant chronic cough.  She is not on oxygen.  She does not recollect having her overnight oxygen desaturation tested.  It appears a few years ago her CT scans were fairly clear and suggestive of volume overload but with time the CT scans of change.  Most recent CT scan of the chest in late 2019 is reported as probable UIP based on craniocaudal gradient and some traction bronchiectasis without honeycombing.  The findings are early and mild.  I personally visualized the CT and agree with the findings.  Rest of the ILD question is all worked up she is got trace positive autoimmune antibodies.  Rheumatology referral is pending and an appointment is pending according to the patient.  The only PFTs from July 2018 which suggest  restriction.      IMPRESSION: 1. The appearance of the lungs is compatible with interstitial lung disease, with a spectrum of findings categorized as probable usual interstitial pneumonia (UIP) based on current ATS guidelines. Repeat high-resolution chest CT is suggested in 12 months to assess for temporal changes in the appearance of the lung parenchyma. 2. Aortic atherosclerosis. 3. Cirrhosis with stigmata of portal venous hypertension, including splenomegaly.  Aortic Atherosclerosis (ICD10-I70.0).   Electronically Signed   By: Vinnie Langton M.D.   On: 04/05/2018 18:24   Results for VERTIS, BAUDER (MRN [id number]) as of 05/25/2018 10:40  Ref. Range 10/06/2017 16:42  IgE (Immunoglobulin E), Serum Latest Ref Range: <OR=114 kU/L <2  ASPERGILLUS FUMIGATUS Latest Ref Range: NEGATIVE  NEGATIVE  [place] Serum Latest Ref Range: NEGATIVE  NEGATIVE  Anti Nuclear Antibody(ANA) Latest Ref Range: NEGATIVE  POSITIVE (A)  ANA Pattern 1 Unknown SPECKLED (A)  ANA Titer 1 Latest Units: titer 4:27 (H)  Cyclic Citrullin Peptide Ab Latest Units: UNITS <16  ds DNA Ab Latest Units: IU/mL 2  RA Latex Turbid. Latest Ref Range: <14 IU/mL <14  ENA SM Ab Ser-aCnc Latest Ref Range: <1.0 NEG AI <1.0 NEG  SSA (Ro) (ENA) Antibody, IgG Latest Ref Range: <1.0 NEG AI >8.0 POS (A)  SSB (La) (ENA) Antibody, IgG Latest Ref Range: <1.0 NEG AI <1.0 NEG  Scleroderma (Scl-70) (ENA) Antibody, IgG Latest Ref Range: <1.0 NEG AI <1.0 NEG  SM/RNP Latest Ref Range: <1.0 NEG AI <1.0 NEG    Cirrhosis labs - Results for TYRESHIA, INGMAN (MRN [id number]) as of 05/25/2018 12:00  Ref. Range 04/26/2018 23:47  Platelets Latest Ref Range: 150 - 400 K/uL 51 (L)    OV 07/12/2018  Subjective:  Patient ID: Edilia Bo, female , DOB: 06/30/1965 , age 64 y.o. , MRN: [id number] , ADDRESS: [place] [address]   07/12/2018 -   Chief Complaint  Patient presents with  . Follow-up    PFT done today. She states  her breathing has been okay. She states she has good days and bad days but she makes sure to pace herself. Recenly seen in ED for URI.    Follow-up probable UIP on CT scan of the chest November 2019 with worsening compared to July 2018.  Female gender.  Positive ANA and SSA associated with clubbing and bilateral basal Velcro crackles -definition of ILD in progress.  Associated with cirrhosis and low platelets, bili 2.2 idiopathic variety  HPI NUHA DEGNER 53 y.o. -returns for follow-up of her ILD.  In the interim we discussed her case at the multidisciplinary case conference.  The consensus was that she did indeed have probable UIP on the CT scan [greater than 80% probability that this is indeed UIP].  Radiologist was concerned about progression between summer 2018 and the fall 2019 based on CT scan appearance.  In the interim overall she is stable although since seeing me she is having respiratory exacerbation has been treated with antibiotic and prednisone.  The prednisone is still continuing with tomorrow being the last day.  I reviewed the primary care physician chart and confirmed it.  She is beginning to feel close to baseline.  She had pulmonary function test today that shows very similar values compared to summer 2018.  Therefore in essence she is stable on PFTs compared to summer 2018 but the decline is on the CT scan.  Her most recent liver work shows a rising bilirubin.  She says she has not seen a hepatologist or GI specialist in a few years.  She is willing to undergo bronchoscopy to narrow down the specific variety of ILD.  We discussed her tolerance to sedation.  She does not have any sleep apnea.  Her last procedure was 3 years ago and 5 years ago .  During this time she has held well with sedation procedures.         OV 02/06/2019  Subjective:  Patient ID: [name], female , DOB: 1966-04-14 , age 16 y.o. , MRN: [id number] , ADDRESS: [address] [place] [address]   02/06/2019 -   Chief Complaint  Patient presents with  . Follow-up    6 month rov.  c/o stable sob with exertion, a sometimes prod cough with clear mucus.      Follow-up probable UIP on CT scan of the chest November 2019 with worsening compared to July 2018.  Female gender.  Positive ANA and SSA associated with clubbing and bilateral basal Velcro crackles -.  Associated with  cirrhosis Hepatic cirrhosis [?  Placey] due to primary biliary cholangitis ([place]) and low platelets, and gastric varices with bleeding. BAL March 2020 with mixed ceullarity - 54% PMN nand 28% lymphs c/w IPF     HPI Amberley Hamler 53 y.o. -presents for follow-up of interstitial lung disease that is presumed IPF with high clinical pretest probability [other differential diagnosis is i.e. IPAF] since I last saw her in March 2020 when she had mixed cellularity in her BAL she has been diagnosed with confirmed hepatic cirrhosis with biliary cholangitis.  She is also had gastric varices.  She had an admission in summer 2020 for worsening acute hypoxemic respiratory failure.  Since then she is been on oxygen.  Today we turned the oxygen off and she is normoxic at rest but easily desaturates with walking.  This is significant deterioration.  She had a CT scan of the chest in July 2020 but there is motion artifact with this.  She had repeat pulmonary function test now and this is documented below.  The FVC is stable but her DLCO is deteriorated.  She is also had ascites for her cirrhosis.  She is under the management of Dr. Carlean Purl.  She still interested in the question of anti-fibrotic's but she has significant amount of dyspnea and cough as documented below.  The oxygen and nebulizers helped the cough only somewhat but she still has a lot of cough.  She is on Mucinex and [employer] but these are not helping the cough.  We had a conversation again that anti-fibrotic's risky in the presence of cirrhosis.   OV  04/10/2019 - telephone visit in setting of covid-19 pandemic  Subjective:  Patient ID: [name], female , DOB: 1965/12/21 , age 28 y.o. , MRN: [id number] , ADDRESS: [address] [place] [address]   04/10/2019 -   Chief Complaint  Patient presents with  . televisit    Pt states she has been having complaints with her breathing and states she is having to use more O2 than before. Pt is on 2L O2. Pt also has been coughing which is a dry cough and occ will get up clear to yellow phlegm.    Follow-up probable UIP on CT scan of the chest November 2019 with worsening compared to July 2018.  Female gender.  Positive ANA and SSA associated with clubbing and bilateral basal Velcro crackles -.  Associated with cirrhosis Hepatic cirrhosis [?  Placey] due to primary biliary cholangitis ([place]) and low platelets, and gastric varices with bleeding. BAL March 2020 with mixed ceullarity - 54% PMN nand 28% lymphs c/w UIP. On supportive care due to cirrhosis . Recommended disability end sept 2020    HPI Melony Tenpas 53 y.o. -  Now living with siblings in [place] so she can get help. Still awaiting disability. Using o2. Has seen GI early nov 2020. Awaiting "cyanoacrylate injection and ablation planned for December" to complete per his notes. No bleeding noted in his notes or per history currently. However, says some dark stool noted a few times in the past month and 1-2 times it was black. Has repored this to Dr Carlean Purl per history.  Rx for bronchitis and congestion on tele visit a month ago. Now sttill on 2 L [place] . Overall feels same . Currenty cough with clear-yellow sputum or sometimes dry. She feels she needs another round of antibiotic and prednisone   This is a telephone visit and the risks, benefits and limitations  was explained to her with the medical assistant.  She was identified with 2 person identifier. OV 05/09/2019  Subjective:  Patient ID: [name], female ,  DOB: 1965-10-22 , age 37 y.o. , MRN: [id number] , ADDRESS: [address] [place] [facility] [address]   05/09/2019 -   Chief Complaint  Patient presents with  . Follow-up    Pt states things have been up and down since last visit. Pt still has a cough that happens throughout the day. Pt does wear 2L O2 with exertion and at rest as needed.     Follow-up probable UIP on CT scan of the chest November 2019 with worsening compared to July 2018.  Female gender.  Positive ANA and SSA associated with clubbing and bilateral basal Velcro crackles -.  Associated with cirrhosis Hepatic cirrhosis ] due to primary biliary cholangitis ([facility]) and low platelets, and gastric varices with bleeding. BAL March 2020 with mixed ceullarity - 54% PMN nand 28% lymphs c/w UIP. On supportive care due to cirrhosis . Recommended disability end sept 2020  Associated pancytopenia with a platelet count in the 60s and a white count in the 1s  HPI Brandilynn Taormina 53 y.o. -returns for follow-up.  Last visit was approximately a month ago on the telephone.  I was concerned about progressive disease and that her hypoxemia indeed might be severe.  When she presented today she was normoxic on room air at rest but walking short distance she desaturated to 86% and is documented below.  Her symptom score appears stable but she is having significant cough.  She is now living with her siblings in [place].  Review of the chart indicates on April 26, 2019 she underwent gastric varix obliteration via glue injection.  It appears after that she had some positional petechiae.  On her most recent labs on April 28, 2019 she has a normal creatinine of 0.7 mg percent but a low albumin of 2.9 bilirubin of 1.3.  She also has pancytopenia with a white count of 1.4, hemoglobin of 10.1 and a platelet count of 67,000.  Her symptom score is significant as documented below.  As she is talking to me she has significant cough.  She also wants a light  portable system but she is not able to afford imaging.  She says advanced home care does not have lighter options but have asked her to call them again.    SYMPTOM SCALE - ILD 02/06/2019  05/09/2019   O2 use 2L exertion  2 L with exertion.  Shortness of Breath 0 -> 5 scale with 5 being worst (score 6 If unable to do)   At rest 2 3  Simple tasks - showers, clothes change, eating, shaving 4 3  Household (dishes, doing bed, laundry)    Shopping 5 4  Walking level at own pace 5 3  Walking keeping up with others of same age 11 5  Walking up Stairs 5 5  Walking up [place] 5 5  Total (40 - 48) Dyspnea Score 35 33  How bad is your cough? 5 6  How bad is your fatigue On and off 6         Simple office walk 250 feet x  3 laps goal with forehead probe 05/25/2018  02/06/2019  05/09/2019   O2 used Room air Room air Room air  Number laps completed 3 Aimed 3 but stoppled 6 tims in 1 lap Lobby to room  Comments about pace slow  Resting Pulse Ox/HR 100% and 70/min 92% and 98/min   Final Pulse Ox/HR 94% and 92/min 86% and 117/min Dropped to 86% on room air  Desaturated </= 88% no yes   Desaturated <= 3% points Yes, 6 points Yes, 8 points   Got Tachycardic >/= 90/min yes yes   Symptoms at end of test Mild dyspnea final lap dy   Miscellaneous comments x       Results for SHLEY, DOLBY "[name]" (MRN [id number]) as of 02/06/2019 10:25  Ref. Range 11/13/2016 11:21 07/12/2018 13:58 01/11/2019 10:08  FVC-Pre Latest Units: L 1.80 1.87 1.88  FVC-%Pred-Pre Latest Units: % 48 50 49  Results for LIVIA, TARR [name] "[name]" (MRN [id number]) as of 02/06/2019 10:25  Ref. Range 11/13/2016 11:21 07/12/2018 13:58 01/11/2019 10:08  DLCO unc Latest Units: ml/min/mmHg  14.68 11.45  DLCO unc % pred Latest Units: %  66 50       ROS - per HPI     has a past medical history of Acute respiratory failure with hypoxia ([place]) (09/22/2018), Anxiety, Arthritis, Chronic kidney disease, Community acquired pneumonia  (11/28/2018), Diabetes mellitus without complication ([place]), Dysrhythmia, Edentulous, Epilepsy ([place]) (in 1972), Gastric varices with bleeding, GERD (gastroesophageal reflux disease) (2008), Head injury, Headache(784.0), Heart murmur, 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 ([place]), History of blood transfusion, History of kidney stones, Hypothyroidism (2008), Interstitial lung disease ([place]), Leukopenia, Lower esophageal ring (08/18/2013), Pneumonia, Primary biliary cholangitis ([place]) (10/24/2018), Seasonal allergies (2003), SEIZURES, HX OF (12/12/2009), Shortness of breath, and Wears glasses.   reports that she has never smoked. She has never used smokeless tobacco.  Past Surgical History:  Procedure Laterality Date  . ABDOMINAL HYSTERECTOMY     " partial "  . BALLOON DILATION N/A 10/24/2013   Procedure: BALLOON DILATION;  Surgeon: Gatha Mayer, MD;  Location: [facility];  Service: Endoscopy;  Laterality: N/A;  . BIOPSY  09/26/2018   Procedure: BIOPSY;  Surgeon: Thornton Park, MD;  Location: [facility];  Service: Gastroenterology;;  . BIOPSY  01/09/2019   Procedure: BIOPSY;  Surgeon: Irving Copas., MD;  Location: [facility];  Service: Gastroenterology;;  . CESAREAN SECTION     x2  . DENTAL SURGERY     multiple extractions 3'15  . DILATION AND CURETTAGE OF UTERUS    . ESOPHAGOGASTRODUODENOSCOPY N/A 10/24/2013   Procedure: ESOPHAGOGASTRODUODENOSCOPY (EGD);  Surgeon: Gatha Mayer, MD;  Location: [facility];  Service: Endoscopy;  Laterality: N/A;  . ESOPHAGOGASTRODUODENOSCOPY N/A 01/09/2019   Procedure: ESOPHAGOGASTRODUODENOSCOPY (EGD);  Surgeon: Irving Copas., MD;  Location: [facility];  Service: Gastroenterology;  Laterality: N/A;  . ESOPHAGOGASTRODUODENOSCOPY (EGD) WITH PROPOFOL N/A 09/26/2018   Procedure: ESOPHAGOGASTRODUODENOSCOPY (EGD) WITH PROPOFOL;  Surgeon: Thornton Park, MD;  Location: [facility];  Service: Gastroenterology;  Laterality: N/A;   . ESOPHAGOGASTRODUODENOSCOPY (EGD) WITH PROPOFOL N/A 04/26/2019   Procedure: ESOPHAGOGASTRODUODENOSCOPY (EGD) WITH PROPOFOL;  Surgeon: Rush Landmark Telford Nab., MD;  Location: [facility];  Service: Gastroenterology;  Laterality: N/A;  . HEMOSTASIS CLIP PLACEMENT  04/26/2019   Procedure: HEMOSTASIS CLIP PLACEMENT;  Surgeon: Irving Copas., MD;  Location: [facility];  Service: Gastroenterology;;  . IR PARACENTESIS  09/23/2018  . LIVER BIOPSY  06/30/2012   Procedure: LIVER BIOPSY;  Surgeon: Shann Medal, MD;  [facility];  Service: General;;  . NOVASURE ABLATION    . SCLEROTHERAPY  04/26/2019   Procedure: Clide Deutscher;  Surgeon: Mansouraty, Telford Nab., MD;  Location: [facility];  Service: Gastroenterology;;  cyanoacrylate  . SHOULDER ARTHROSCOPY Right 2011  .  UMBILICAL HERNIA REPAIR N/A 06/30/2012   Procedure: remove umbilicus;  Surgeon: Shann Medal, MD;  [facility];  Service: General;  Laterality: N/A;  . UPPER ESOPHAGEAL ENDOSCOPIC ULTRASOUND (EUS) N/A 01/09/2019   Procedure: UPPER ESOPHAGEAL ENDOSCOPIC ULTRASOUND (EUS);  Surgeon: Irving Copas., MD;  Location: [facility];  Service: Gastroenterology;  Laterality: N/A;  . VENTRAL HERNIA REPAIR N/A 06/30/2012   Procedure: LAPAROSCOPIC VENTRAL HERNIA;  Surgeon: Shann Medal, MD;  [facility];  Service: General;  Laterality: N/A;  With Mesh  . VIDEO BRONCHOSCOPY Bilateral 07/20/2018   Procedure: VIDEO BRONCHOSCOPY WITHOUT FLUORO;  Surgeon: Brand Males, MD;  [facility];  Service: Cardiopulmonary;  Laterality: Bilateral;  . WISDOM TOOTH EXTRACTION      Allergies  Allergen Reactions  . Aspirin Nausea Only    Upset stomach    Immunization History  Administered Date(s) Administered  . Influenza-Unspecified 01/11/2019  . Pneumococcal Polysaccharide-23 08/26/2011, 09/12/2017  . Td 05/11/2006    Family History  Problem Relation Age of Onset  . Hypertension Mother   . Diabetes  Mother   . Cirrhosis Mother        ? medications  . Lung cancer Father   . Stroke Father   . Diabetes Sister   . Hypertension Sister   . Colon cancer Neg Hx   . Esophageal cancer Neg Hx   . Rectal cancer Neg Hx      Current Outpatient Medications:  .  acetaminophen (TYLENOL) 500 MG tablet, Take 1,000 mg by mouth every 6 (six) hours as needed for mild pain., Disp: , Rfl:  .  albuterol (PROAIR HFA) 108 (90 Base) MCG/ACT inhaler, INHALE 2 PUFFS INTO THE LUNGS EVERY 6 HOURS AS NEEDED FOR SHORTNESS OF BREATH (Patient taking differently: Inhale 2 puffs into the lungs every 4 (four) hours as needed for wheezing or shortness of breath. ), Disp: 8.5 g, Rfl: 5 .  albuterol (PROVENTIL) (2.5 MG/3ML) 0.083% nebulizer solution, Take 3 mLs (2.5 mg total) by nebulization every 6 (six) hours as needed for wheezing or shortness of breath., Disp: 75 mL, Rfl: 2 .  blood glucose meter kit and supplies, Dispense based on patient and insurance preference. Use up to four times daily as directed. (FOR ICD-10 E10.9, E11.9)., Disp: 1 each, Rfl: 0 .  budesonide-formoterol (SYMBICORT) 80-4.5 MCG/ACT inhaler, Inhale 2 puffs into the lungs 2 (two) times daily for 1 day., Disp: 1 Inhaler, Rfl: 0 .  cetirizine (ZYRTEC) 10 MG tablet, Take 1 tablet (10 mg total) by mouth daily., Disp: 30 tablet, Rfl: 11 .  fluticasone (FLONASE) 50 MCG/ACT nasal spray, Place 2 sprays into both nostrils daily., Disp: , Rfl:  .  glucose blood (TRUE METRIX BLOOD GLUCOSE TEST) test strip, Use as instructed, Disp: 100 each, Rfl: 12 .  guaiFENesin (MUCINEX) 600 MG 12 hr tablet, Take 600 mg by mouth daily. , Disp: , Rfl:  .  levothyroxine (SYNTHROID) 125 MCG tablet, TAKE 2 TABLETS (250 MCG TOTAL) BY MOUTH DAILY., Disp: 60 tablet, Rfl: 3 .  metFORMIN (GLUCOPHAGE) 500 MG tablet, Take 1 tablet (500 mg total) by mouth 2 (two) times daily with a meal., Disp: 180 tablet, Rfl: 2 .  ondansetron (ZOFRAN ODT) 4 MG disintegrating tablet, Take 1-2 tablets (4-8  mg total) by mouth every 8 (eight) hours as needed for nausea or vomiting., Disp: 18 tablet, Rfl: 0 .  OXYGEN, Inhale 2 L into the lungs continuous. Use sleeping and when out and about , Disp: , Rfl:  .  pantoprazole (PROTONIX) 40 MG tablet, Take 1 tablet (40 mg total) by mouth 2 (two) times daily before a meal., Disp: 60 tablet, Rfl: 3 .  SPIRIVA HANDIHALER 18 MCG inhalation capsule, INHALE 1 CAPSULE VIA HANDIHALER ONCE DAILY AT THE SAME TIME EVERY DAY (Patient taking differently: Place 18 mcg into inhaler and inhale daily. ), Disp: 30 capsule, Rfl: 0 .  tamsulosin (FLOMAX) 0.4 MG CAPS capsule, Take 1 capsule (0.4 mg total) by mouth daily., Disp: 30 capsule, Rfl: 3 .  TRUEplus Lancets 28G MISC, Use to check blood sugar as directed., Disp: 100 each, Rfl: 1 .  ursodiol (ACTIGALL) 500 MG tablet, Take 1 tablet (500 mg total) by mouth 3 (three) times daily., Disp: 270 tablet, Rfl: 3      Objective:   Vitals:   05/09/19 1153 05/09/19 1156  BP: 126/64   Pulse: (!) 110 86  SpO2: (!) 86% 99%  Weight: 243 lb 6.4 oz (110.4 kg)   Height: _0  (1.676 m)     Estimated body mass index is 39.29 kg/m as calculated from the following:   Height as of this encounter: _1  (1.676 m).   Weight as of this encounter: 243 lb 6.4 oz (110.4 kg).  _2 @  [employer]   05/09/19 1153  Weight: 243 lb 6.4 oz (110.4 kg)     Physical Exam  General Appearance:    Alert, cooperative, no distress, appears stated age - yes , Deconditioned looking - yes , OBESE  - yes, Sitting on Wheelchair -  no  Head:    Normocephalic, without obvious abnormality, atraumatic  Eyes:    PERRL, conjunctiva/corneas clear,  Ears:    Normal TM's and external ear canals, both ears  Nose:   Nares normal, septum midline, mucosa normal, no drainage    or sinus tenderness. OXYGEN ON  - yes . Patient is @ 2L   Throat:   Lips, mucosa, and tongue normal; teeth and gums normal. Cyanosis on lips - no  Neck:   Supple, symmetrical,  trachea midline, no adenopathy;    thyroid:  no enlargement/tenderness/nodules; no carotid   bruit or JVD  Back:     Symmetric, no curvature, ROM normal, no CVA tenderness  Lungs:     Distress - no , Wheeze no, Barrell Chest - no, Purse lip breathing - no, Crackles - yes   Chest Wall:    No tenderness or deformity.    Heart:    Regular rate and rhythm, S1 and S2 normal, no rub   or gallop, Murmur - no  Breast Exam:    NOT DONE  Abdomen:     Soft, non-tender, bowel sounds active all four quadrants,    no masses, no organomegaly. Visceral obesity - yes  Genitalia:   NOT DONE  Rectal:   NOT DONE  Extremities:   Extremities - normal, Has Cane - no, Clubbing - no, Edema - no  Pulses:   2+ and symmetric all extremities  Skin:   Stigmata of Connective Tissue Disease - no. SPIDER NEVII +  Lymph nodes:   Cervical, supraclavicular, and axillary nodes normal  Psychiatric:  Neurologic:   Pleasant - yes, Anxious - no, Flat affect - no  CAm-ICU - neg, Alert and Oriented x 3 - yes, Moves all 4s - yes, Speech - normal, Cognition - intact           Assessment:       ICD-10-CM   1. ILD (interstitial lung disease) (  [place])  J84.9 Ambulatory Referral for DME    Pulmonary function test  2. Chronic respiratory failure with hypoxia (H[facility])  J96.11 Ambulatory Referral for DME  3. Dyspnea, unspecified type  R06.00   4. 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  Slightly progressive.  She has significant symptom burden with dyspnea and more so with cough.  She coughs even though she is talking to me.  She could be a candidate for antifibrotic's but this is contraindicated in liver disease.  I could try immunosuppression because of antibodies but then she has pancytopenia.  We discussed hospice briefly but she does not feel she is advanced enough for that.  She is taking Mucinex for cough.  I will have to come up with something more reasonable.  We could try prednisone but she  has obesity.  I will discuss with Dr. Arelia Longest her GI about safe options in terms of a drug-induced liver injury standpoint.    Plan:     Patient Instructions     ICD-10-CM   1. ILD (interstitial lung disease) ([place])  J84.9   2. Chronic respiratory failure with hypoxia (HCC)  J96.11   3. Dyspnea, unspecified type  R06.00   4. Chronic cough  R05    Disease slowly progressive Lot of symptoms Tough problem to address with underlying cirrhosis  Plan  - cotninue 02 as before  - see if [employer] will give you a lighter o2 if at all possible  - I will d/w Dr Carlean Purl about doing anti-fibrotic or immune suppression medicine for your fibrosis in setting of cirrhosis - I am afraid somewhat to do these medicines due to liver issues - get consejt for ILD PRO study - we will discuss more at followup or schedule a separate resarch visit  Followup  - 6-8 weeks do spirometrry/dlco  - return to see Dr Chase Caller in 30 min slot ILD clinic after test   > 50% of this > 40 min visit spent in  face to face counseling or/and coordination of care - by this undersigned MD - Dr Brand Males. This includes one or more of the following documented above: discussion of test results, diagnostic or treatment recommendations, prognosis, risks and benefits of management options, instructions, education, compliance or risk-factor reduction   SIGNATURE    Dr. Brand Males, M.D., F.C.C.P,  Pulmonary and Critical Care Medicine Staff Physician, [facility] Director - Interstitial Lung Disease  Program  P[facility] [place], [place], [address]  Pager: [phone], If no answer or between  15:00h - 7:00h: call [phone] Telephone: [phone]  12:49 PM 05/09/2019

## 2019-05-10 ENCOUNTER — Ambulatory Visit (HOSPITAL_COMMUNITY)
Admission: RE | Admit: 2019-05-10 | Discharge: 2019-05-10 | Disposition: A | Discharge status: Home / Self Care | Payer: Medicaid Other | Source: Ambulatory Visit | Attending: Internal Medicine | Admitting: Internal Medicine

## 2019-05-10 DIAGNOSIS — K743 Primary biliary cirrhosis: Secondary | ICD-10-CM | POA: Diagnosis present

## 2019-05-11 NOTE — Telephone Encounter (Signed)
I think OK for prednisone but I share your concerns about immunosupressants due to pancytopenia.  I do not know so much about the anti-fibrotics you would use - think its a risk benefit issue   Suspect steroids, if they work, and despite other side effects would be best bet.  If you have specific drug in mind I am willing to study that and give a more detailed opinion. I think she would likely bump her LFT's some if there were an injury but we run the risk of knocking out her liver.

## 2019-05-15 ENCOUNTER — Other Ambulatory Visit: Payer: Self-pay

## 2019-05-15 ENCOUNTER — Emergency Department (HOSPITAL_COMMUNITY): Payer: Medicaid Other

## 2019-05-15 ENCOUNTER — Emergency Department (HOSPITAL_COMMUNITY)
Admission: EM | Admit: 2019-05-15 | Discharge: 2019-05-15 | Disposition: A | Discharge status: Home / Self Care | Payer: Medicaid Other | Attending: Emergency Medicine | Admitting: Emergency Medicine

## 2019-05-15 ENCOUNTER — Encounter (HOSPITAL_COMMUNITY): Payer: Self-pay | Admitting: Emergency Medicine

## 2019-05-15 DIAGNOSIS — Z20828 Contact with and (suspected) exposure to other viral communicable diseases: Secondary | ICD-10-CM | POA: Insufficient documentation

## 2019-05-15 DIAGNOSIS — N189 Chronic kidney disease, unspecified: Secondary | ICD-10-CM | POA: Insufficient documentation

## 2019-05-15 DIAGNOSIS — E1122 Type 2 diabetes mellitus with diabetic chronic kidney disease: Secondary | ICD-10-CM | POA: Insufficient documentation

## 2019-05-15 DIAGNOSIS — R0789 Other chest pain: Secondary | ICD-10-CM | POA: Diagnosis present

## 2019-05-15 DIAGNOSIS — R0602 Shortness of breath: Secondary | ICD-10-CM | POA: Diagnosis not present

## 2019-05-15 DIAGNOSIS — Z79899 Other long term (current) drug therapy: Secondary | ICD-10-CM | POA: Insufficient documentation

## 2019-05-15 DIAGNOSIS — E039 Hypothyroidism, unspecified: Secondary | ICD-10-CM | POA: Insufficient documentation

## 2019-05-15 DIAGNOSIS — Z7984 Long term (current) use of oral hypoglycemic drugs: Secondary | ICD-10-CM | POA: Insufficient documentation

## 2019-05-15 LAB — TROPONIN I (HIGH SENSITIVITY)
Troponin I (High Sensitivity): 5 ng/L (ref <18)
Troponin I (High Sensitivity): 5 ng/L (ref <18)

## 2019-05-15 LAB — I-STAT BETA HCG BLOOD, ED (MC, WL, AP ONLY): I-stat hCG, quantitative: <5 m[IU]/mL (ref <5)

## 2019-05-15 LAB — CBC
HCT: 32.2 % — ABNORMAL LOW (ref 36.0–46.0)
Hemoglobin: 10.1 g/dL — ABNORMAL LOW (ref 12.0–15.0)
MCH: 27.9 pg (ref 26.0–34.0)
MCHC: 31.4 g/dL (ref 30.0–36.0)
MCV: 89 fL (ref 80.0–100.0)
Platelets: 61 10*3/uL — ABNORMAL LOW (ref 150–400)
RBC: 3.62 MIL/uL — ABNORMAL LOW (ref 3.87–5.11)
RDW: 14.9 % (ref 11.5–15.5)
WBC: 1.1 10*3/uL — CL (ref 4.0–10.5)
nRBC: 0 % (ref 0.0–0.2)

## 2019-05-15 LAB — BASIC METABOLIC PANEL
Anion gap: 6 (ref 5–15)
BUN: 7 mg/dL (ref 6–20)
CO2: 22 mmol/L (ref 22–32)
Calcium: 8.8 mg/dL — ABNORMAL LOW (ref 8.9–10.3)
Chloride: 110 mmol/L (ref 98–111)
Creatinine, Ser: 0.75 mg/dL (ref 0.44–1.00)
GFR calc Af Amer: >60 mL/min (ref >60)
GFR calc non Af Amer: >60 mL/min (ref >60)
Glucose, Bld: 135 mg/dL — ABNORMAL HIGH (ref 70–99)
Potassium: 3.8 mmol/L (ref 3.5–5.1)
Sodium: 138 mmol/L (ref 135–145)

## 2019-05-15 LAB — POC SARS CORONAVIRUS 2 AG -  ED: SARS Coronavirus 2 Ag: NEGATIVE

## 2019-05-15 MED ORDER — BENZONATATE 100 MG PO CAPS: 100.0000 mg | ORAL_CAPSULE | Freq: Three times a day (TID) | ORAL | 0 refills | Status: DC

## 2019-05-15 MED ORDER — IPRATROPIUM BROMIDE HFA 17 MCG/ACT IN AERS
2.0000 | INHALATION_SPRAY | Freq: Once | RESPIRATORY_TRACT | Status: AC
  Administered 2019-05-15: 2 via RESPIRATORY_TRACT
  Filled 2019-05-15: qty 12.9

## 2019-05-15 MED ORDER — DEXTROMETHORPHAN HBR 15 MG/5ML PO SYRP: 10.0000 mL | ORAL_SOLUTION | Freq: Four times a day (QID) | ORAL | 0 refills | Status: DC | PRN

## 2019-05-15 MED ORDER — ALBUTEROL SULFATE HFA 108 (90 BASE) MCG/ACT IN AERS
4.0000 | INHALATION_SPRAY | Freq: Once | RESPIRATORY_TRACT | Status: AC
  Administered 2019-05-15: 4 via RESPIRATORY_TRACT
  Filled 2019-05-15: qty 6.7

## 2019-05-15 MED ORDER — PREDNISONE 10 MG PO TABS: 40.0000 mg | ORAL_TABLET | Freq: Every day | ORAL | 0 refills | Status: AC

## 2019-05-15 MED ORDER — SODIUM CHLORIDE 0.9% FLUSH: 3.0000 mL | Freq: Once | INTRAVENOUS | Status: DC

## 2019-05-15 MED ORDER — PREDNISONE 20 MG PO TABS
40.0000 mg | ORAL_TABLET | Freq: Once | ORAL | Status: AC
  Administered 2019-05-15: 40 mg via ORAL
  Filled 2019-05-15: qty 2

## 2019-05-15 MED FILL — BENZONATATE 100 MG CAPS: 100 | 7 days supply | Qty: 21 | Fill #0

## 2019-05-15 MED FILL — predniSONE 10 MG TABS: 10 | 5 days supply | Qty: 20 | Fill #0

## 2019-05-15 NOTE — ED Notes (Signed)
MD Steinl at bedside.

## 2019-05-15 NOTE — ED Provider Notes (Signed)
[facility] EMERGENCY DEPARTMENT Provider Note   CSN: [id number] Arrival date & time: 05/15/19  0048     History Chief Complaint  Patient presents with  . Shortness of Breath  . Chest Pain    Holly Mueller is a 54 y.o. female.  HPI  Patient is a 54 year old female with a history of CKD, chronic leukopenia, interstitial lung disease on Symbicort and albuterol and 2 L home oxygen  Patient presents today for cough, congestion, increase shortness of breath, body aches and inability to sleep due to shortness of breath.  Patient also endorses chest pressure but denies any nausea, chest pain radiation, exertional chest pain, pleuritic chest pain, unilateral leg swelling, history of blood clots, fevers, dysgeusia/anosmia.   Patient states his chest pressure is constant and unremitting and describes it as a heaviness.  Denies any exacerbating factors.  States that Mucinex improves her symptoms somewhat.  Patient states that she sees her pulmonologist frequently most recently saw him on the 29th.  She is scheduled to follow-up with him in the next 2 weeks for pulmonary function tests and checkup.  Patient states she is ambulatory at home but is chronically dyspneic.  States that she uses a motorized scooter when she goes to [place] in order to get around however she states that because of Covid she only leaves the house for groceries      Past Medical History:  Diagnosis Date  . Acute respiratory failure with hypoxia ([place]) 09/22/2018  . Anxiety    "anxiety attacks- sometimes"  . Arthritis    bursitis left hip flares-not an issue  . Chronic kidney disease   . Community acquired pneumonia 11/28/2018  . Diabetes mellitus without complication ([place])    Type II  . Dysrhythmia   . Edentulous    10-19-13 at present  . Epilepsy ([place]) in 1972   No seizures since 1972. Previously treated with phenobarbital.   . Gastric varices with bleeding   . GERD (gastroesophageal  reflux disease) 2008  . 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 Headache(784.0)    hx of migraines   . Heart murmur   . 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 History of blood transfusion    gi bleed  . History of kidney stones    passed  . Hypothyroidism 2008  . Interstitial lung disease ([place])   . Leukopenia   . Lower esophageal ring 08/18/2013  . Pneumonia   . Primary biliary cholangitis ([place]) 10/24/2018  . Seasonal allergies 2003  . SEIZURES, HX OF 12/12/2009   Annotation: last seizure in early 1970s Qualifier: Diagnosis of  By: Carlena Sax  MD, Colletta [place]    . Shortness of breath    walking  . Wears glasses     Patient Active Problem List   Diagnosis Date Noted  . Petechiae 05/01/2019  . Lower extremity edema 05/01/2019  . Irritant contact dermatitis due to detergent 03/31/2019  . Melena 03/31/2019  . Financial difficulties 03/31/2019  . Skin rash 03/17/2019  . Intertrigo 02/11/2019  . Dysuria 02/11/2019  . Chronic respiratory failure with hypoxia ([place]) 01/11/2019  . Inadequate housing 12/05/2018  . Primary biliary cholangitis ([place]) 10/24/2018  . Gastric varices with bleeding   . Anemia 09/22/2018  . Pancytopenia ([facility]) 08/28/2018  . Does not have health insurance 04/04/2018  . ILD (interstitial lung disease) ([place]) 03/23/2018  . Type 2 diabetes mellitus  without complication, without long-term current use of insulin ([place])   . 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 GERD (gastroesophageal reflux disease) 10/23/2016  . Cough 12/09/2015  . Precordial chest pain 08/03/2015  . DOE (dyspnea on exertion) 08/17/2013  . Ganglion cyst of wrist 08/24/2012  . Right hip pain 08/23/2012  . Complex care coordination 02/14/2012  . Obesity 07/08/2010  . Shortness of breath 12/12/2009  . Depression 07/26/2008  . Hypothyroidism 10/12/2006  . Restrictive lung disease 07/30/2006     Past Surgical History:  Procedure Laterality Date  . ABDOMINAL HYSTERECTOMY     " partial "  . BALLOON DILATION N/A 10/24/2013   Procedure: BALLOON DILATION;  Surgeon: Gatha Mayer, MD;  Location: [facility];  Service: Endoscopy;  Laterality: N/A;  . BIOPSY  09/26/2018   Procedure: BIOPSY;  Surgeon: [place] Park, MD;  Location: [facility];  Service: Gastroenterology;;  . BIOPSY  01/09/2019   Procedure: BIOPSY;  Surgeon: Irving Copas., MD;  Location: [facility];  Service: Gastroenterology;;  . CESAREAN SECTION     x2  . DENTAL SURGERY     multiple extractions 3'15  . DILATION AND CURETTAGE OF UTERUS    . ESOPHAGOGASTRODUODENOSCOPY N/A 10/24/2013   Procedure: ESOPHAGOGASTRODUODENOSCOPY (EGD);  Surgeon: Gatha Mayer, MD;  Location: [facility];  Service: Endoscopy;  Laterality: N/A;  . ESOPHAGOGASTRODUODENOSCOPY N/A 01/09/2019   Procedure: ESOPHAGOGASTRODUODENOSCOPY (EGD);  Surgeon: Irving Copas., MD;  Location: [facility];  Service: Gastroenterology;  Laterality: N/A;  . ESOPHAGOGASTRODUODENOSCOPY (EGD) WITH PROPOFOL N/A 09/26/2018   Procedure: ESOPHAGOGASTRODUODENOSCOPY (EGD) WITH PROPOFOL;  Surgeon: Thornton Park, MD;  Location: [facility];  Service: Gastroenterology;  Laterality: N/A;  . ESOPHAGOGASTRODUODENOSCOPY (EGD) WITH PROPOFOL N/A 04/26/2019   Procedure: ESOPHAGOGASTRODUODENOSCOPY (EGD) WITH PROPOFOL;  Surgeon: Rush Landmark Telford Nab., MD;  Location: [facility];  Service: Gastroenterology;  Laterality: N/A;  . HEMOSTASIS CLIP PLACEMENT  04/26/2019   Procedure: HEMOSTASIS CLIP PLACEMENT;  Surgeon: Irving Copas., MD;  Location: [facility];  Service: Gastroenterology;;  . IR PARACENTESIS  09/23/2018  . LIVER BIOPSY  06/30/2012   Procedure: LIVER BIOPSY;  Surgeon: Shann Medal, MD;  [facility];  Service: General;;  . NOVASURE ABLATION    . SCLEROTHERAPY  04/26/2019   Procedure: Clide Deutscher;  Surgeon: Mansouraty, Telford Nab., MD;  Location: [facility];  Service: Gastroenterology;;  cyanoacrylate  . SHOULDER ARTHROSCOPY Right 2011  . UMBILICAL HERNIA REPAIR N/A 06/30/2012   Procedure: remove umbilicus;  Surgeon: Shann Medal, MD;  [facility];  Service: General;  Laterality: N/A;  . UPPER ESOPHAGEAL ENDOSCOPIC ULTRASOUND (EUS) N/A 01/09/2019   Procedure: UPPER ESOPHAGEAL ENDOSCOPIC ULTRASOUND (EUS);  Surgeon: Irving Copas., MD;  Location: [facility];  Service: Gastroenterology;  Laterality: N/A;  . VENTRAL HERNIA REPAIR N/A 06/30/2012   Procedure: LAPAROSCOPIC VENTRAL HERNIA;  Surgeon: Shann Medal, MD;  [facility];  Service: General;  Laterality: N/A;  With Mesh  . VIDEO BRONCHOSCOPY Bilateral 07/20/2018   Procedure: VIDEO BRONCHOSCOPY WITHOUT FLUORO;  Surgeon: Brand Males, MD;  [facility];  Service: Cardiopulmonary;  Laterality: Bilateral;  . WISDOM TOOTH EXTRACTION       OB History   No obstetric history on file.     Family History  Problem Relation Age of Onset  . Hypertension Mother   . Diabetes Mother   . Cirrhosis Mother        ? medications  . Lung cancer Father   . Stroke  Father   . Diabetes Sister   . Hypertension Sister   . Colon cancer Neg Hx   . Esophageal cancer Neg Hx   . Rectal cancer Neg Hx     Social History   Tobacco Use  . Smoking status: Never Smoker  . Smokeless tobacco: Never Used  . Tobacco comment: 04/25/2019- "I smoke part of a cigarette and put it out,I did not like it."  Substance Use Topics  . Alcohol use: Not Currently  . Drug use: No    Home Medications Prior to Admission medications   Medication Sig Start Date End Date Taking? Authorizing Provider  acetaminophen (TYLENOL) 500 MG tablet Take 1,000 mg by mouth every 6 (six) hours as needed for mild pain.    [provider]  albuterol (PROAIR HFA) 108 (90 Base) MCG/ACT inhaler INHALE 2 PUFFS INTO THE LUNGS EVERY 6 HOURS AS NEEDED FOR SHORTNESS OF BREATH  Patient taking differently: Inhale 2 puffs into the lungs every 4 (four) hours as needed for wheezing or shortness of breath.  04/25/18   Rory Percy, DO  albuterol (PROVENTIL) (2.5 MG/3ML) 0.083% nebulizer solution Take 3 mLs (2.5 mg total) by nebulization every 6 (six) hours as needed for wheezing or shortness of breath. 04/10/19   Brand Males, MD  blood glucose meter kit and supplies Dispense based on patient and insurance preference. Use up to four times daily as directed. (FOR ICD-10 E10.9, E11.9). 09/02/18   Dessa Phi, DO  budesonide-formoterol (SYMBICORT) 80-4.5 MCG/ACT inhaler Inhale 2 puffs into the lungs 2 (two) times daily for 1 day. 01/11/19 03/08/20  Lauraine Rinne, NP  cetirizine (ZYRTEC) 10 MG tablet Take 1 tablet (10 mg total) by mouth daily. 01/27/17   Glenis Smoker, MD  fluticasone (FLONASE) 50 MCG/ACT nasal spray Place 2 sprays into both nostrils daily.    [provider]  glucose blood (TRUE METRIX BLOOD GLUCOSE TEST) test strip Use as instructed 09/16/18   Glenis Smoker, MD  guaiFENesin (MUCINEX) 600 MG 12 hr tablet Take 600 mg by mouth daily.     [provider]  levothyroxine (SYNTHROID) 125 MCG tablet TAKE 2 TABLETS (250 MCG TOTAL) BY MOUTH DAILY. 04/12/19   Meccariello, Bernita Raisin, DO  metFORMIN (GLUCOPHAGE) 500 MG tablet Take 1 tablet (500 mg total) by mouth 2 (two) times daily with a meal. 12/06/18   Meccariello, Bernita Raisin, DO  ondansetron (ZOFRAN ODT) 4 MG disintegrating tablet Take 1-2 tablets (4-8 mg total) by mouth every 8 (eight) hours as needed for nausea or vomiting. 04/26/19   Mansouraty, Telford Nab., MD  OXYGEN Inhale 2 L into the lungs continuous. Use sleeping and when out and about     [provider]  pantoprazole (PROTONIX) 40 MG tablet Take 1 tablet (40 mg total) by mouth 2 (two) times daily before a meal. 10/17/18   Glenis Smoker, MD  predniSONE (DELTASONE) 10 MG tablet Take 4 tablets (40 mg total) by mouth  daily for 5 days. 05/15/19 05/20/19  Miley Blanchett S, PA  SPIRIVA HANDIHALER 18 MCG inhalation capsule INHALE 1 CAPSULE VIA HANDIHALER ONCE DAILY AT THE SAME TIME EVERY DAY Patient taking differently: Place 18 mcg into inhaler and inhale daily.  01/09/19   Meccariello, Bernita Raisin, DO  tamsulosin (FLOMAX) 0.4 MG CAPS capsule Take 1 capsule (0.4 mg total) by mouth daily. 02/10/19   Benay Pike, MD  TRUEplus Lancets 28G MISC Use to check blood sugar as directed. 09/16/18   Lindell Noe,  Anastasia Pall, MD  ursodiol (ACTIGALL) 500 MG tablet Take 1 tablet (500 mg total) by mouth 3 (three) times daily. 10/26/18   Gatha Mayer, MD    Allergies    Aspirin  Review of Systems   Review of Systems  Constitutional: Positive for fatigue. Negative for chills and fever.  HENT: Positive for congestion.   Eyes: Negative for pain.  Respiratory: Positive for cough and shortness of breath.   Cardiovascular: Positive for chest pain (pressure). Negative for leg swelling.  Gastrointestinal: Negative for abdominal pain and vomiting.  Genitourinary: Negative for dysuria.  Musculoskeletal: Negative for myalgias.  Skin: Negative for rash.  Neurological: Negative for dizziness and headaches.    Physical Exam Updated Vital Signs BP 126/76   Pulse 92   Temp 98.3 F (36.8 C) (Oral)   Resp (!) 28   LMP  (LMP Unknown)   SpO2 97%   Physical Exam Vitals and nursing note reviewed.  Constitutional:      General: She is not in acute distress. HENT:     Head: Normocephalic and atraumatic.     Nose: Nose normal.  Eyes:     General: No scleral icterus. Cardiovascular:     Rate and Rhythm: Normal rate and regular rhythm.     Pulses: Normal pulses.     Heart sounds: Normal heart sounds.  Pulmonary:     Effort: Pulmonary effort is normal. No respiratory distress.     Breath sounds: No wheezing.     Comments: Diffuse crackles clearly auscultated in all lung fields.  Patient is breathing approximately 22 respirations per  minute on home 2 L of oxygen.  She does not appear dyspneic coughs frequently however. Abdominal:     Palpations: Abdomen is soft.     Tenderness: There is no abdominal tenderness.  Musculoskeletal:     Cervical back: Normal range of motion.     Right lower leg: No edema.     Left lower leg: No edema.     Comments: Bilateral lower extremities with trace, symmetric edema to the ankle  No tenderness to palpation of bilateral calves.  No unilateral leg swelling or tenderness.  Skin:    General: Skin is warm and dry.     Capillary Refill: Capillary refill takes less than 2 seconds.  Neurological:     Mental Status: She is alert. Mental status is at baseline.  Psychiatric:        Mood and Affect: Mood normal.        Behavior: Behavior normal.     ED Results / Procedures / Treatments   Labs (all labs ordered are listed, but only abnormal results are displayed) Labs Reviewed  BASIC METABOLIC PANEL - Abnormal; Notable for the following components:      Result Value   Glucose, Bld 135 (*)    Calcium 8.8 (*)    All other components within normal limits  CBC - Abnormal; Notable for the following components:   WBC 1.1 (*)    RBC 3.62 (*)    Hemoglobin 10.1 (*)    HCT 32.2 (*)    Platelets 61 (*)    All other components within normal limits  NOVEL CORONAVIRUS, NAA (HOSP ORDER, SEND-OUT TO REF LAB; TAT 18-24 HRS)  I-STAT BETA HCG BLOOD, ED ([facility], [facility], [facility] ONLY)  POC SARS CORONAVIRUS 2 AG -  ED  TROPONIN I (HIGH SENSITIVITY)  TROPONIN I (HIGH SENSITIVITY)    EKG EKG Interpretation  Date/Time:  Monday May 15 2019 01:30:36 EST Ventricular Rate:  101 PR Interval:  152 QRS Duration: 164 QT Interval:  352 QTC Calculation: 456 R Axis:   9 Text Interpretation: Sinus tachycardia Non-specific intra-ventricular conduction block Left ventricular hypertrophy ( R in aVL , Cornell product ) Abnormal ECG When compared with ECG of 11/20/2018, Non-specific intra-ventricular conduction block is  now present Confirmed by Delora Fuel ([id number]) on 05/15/2019 1:45:23 AM   Radiology DG Chest Portable 1 View  Result Date: 05/15/2019 CLINICAL DATA:  Chest pain and short of breath EXAM: PORTABLE CHEST 1 VIEW COMPARISON:  12/08/2018, 07/05/2018, CT chest 12/09/2018, 04/26/2018 FINDINGS: Low lung volumes. Diffuse coarse interstitial opacity appears chronic. No pleural effusion. Stable cardiomediastinal silhouette with aortic atherosclerosis. No pneumothorax. IMPRESSION: Low lung volumes with coarse chronic appearing diffuse interstitial opacity. No definite acute superimposed airspace disease Electronically Signed   By: Donavan Foil M.D.   On: 05/15/2019 02:08    Procedures Procedures (including critical care time)  Medications Ordered in ED Medications  sodium chloride flush (NS) 0.9 % injection 3 mL (has no administration in time range)  albuterol (VENTOLIN HFA) 108 (90 Base) MCG/ACT inhaler 4 puff (4 puffs Inhalation Given 05/15/19 1128)  ipratropium (ATROVENT HFA) inhaler 2 puff (2 puffs Inhalation Given 05/15/19 1128)  predniSONE (DELTASONE) tablet 40 mg (40 mg Oral Given 05/15/19 1128)    ED Course  I have reviewed the triage vital signs and the nursing notes.  Pertinent labs & imaging results that were available during my care of the patient were reviewed by me and considered in my medical decision making (see chart for details).    MDM Rules/Calculators/A&P                      Patient is a 54 year old female with a history of CKD, chronic leukopenia, interstitial lung disease on Symbicort and albuterol and 2 L home oxygen  Patient presents today for cough, congestion, increase shortness of breath, body aches and inability to sleep due to shortness of breath.  Patient also endorses chest pressure but denies any nausea, chest pain radiation, exertional chest pain, pleuritic chest pain, unilateral leg swelling, history of blood clots, fevers, dysgeusia/anosmia.   Patient of negative  Covid antigens will reswab for Covid PCR on discharge.  Patient with stable leukopenia.  Mild stable anemia.  No electrolyte abnormalities.  Rapid antigen Covid test is negative.  Troponin x2 within normal limits.  Pregnancy test negative.  Vitals are reassuring with no tachycardia and satting 97 to 100% on home oxygen.  I discussed this case with my attending physician who cosigned this note including patient's presenting symptoms, physical exam, and planned diagnostics and interventions. Attending physician stated agreement with plan or made changes to plan which were implemented.   Attending physician assessed patient at bedside.  Patient continues to be well-appearing after albuterol and Atrovent and steroid.  Will discharge with steroid and recommend she keep her appointment with pulmonology.  Likely this is a asthma/COPD/interstitial lung disease exacerbation.  She has benefited from steroid treatment in the past.   The medical records were personally reviewed by myself. I personally reviewed all lab results and interpreted all imaging studies and either concurred with their official read or contacted radiology for clarification.   This patient appears reasonably screened and I doubt any other medical condition requiring further workup, evaluation, or treatment in the ED at this time prior to discharge.   Patient's vitals are WNL apart from vital sign abnormalities  discussed above, patient is in NAD, and able to ambulate in the ED at their baseline. Pain has been managed or a plan has been made for home management and has no complaints prior to discharge. Patient is comfortable with above plan and for discharge at this time. All questions were answered prior to disposition. Results from the ER workup discussed with the patient face to face and all questions answered to the best of my ability. The patient is safe for discharge with strict return precautions. Patient appears safe for discharge  with appropriate follow-up. Conveyed my impression with the patient and they voiced understanding and are agreeable to plan.   An After Visit Summary was printed and given to the patient.  Portions of this note were generated with [employer]. Dictation errors may occur despite best attempts at proofreading.     Final Clinical Impression(s) / ED Diagnoses Final diagnoses:  Shortness of breath    Rx / DC Orders ED Discharge Orders         Ordered    predniSONE (DELTASONE) 10 MG tablet  Daily     05/15/19 1257           Pati Gallo [place], [place] 05/15/19 1259    Lajean Saver, MD 05/15/19 1421

## 2019-05-15 NOTE — ED Notes (Signed)
Discharge instructions and prescription discussed with Pt. Pt verbalized understanding. Pt stable and leaving via WC.

## 2019-05-15 NOTE — ED Triage Notes (Addendum)
Per EMS, pt from home, C/O increased SOB, cough, mucas congestion, body aches and inability to sleep b/c she can't get into a position that allows her to breath comfortably.  Pt states she has chest pressure.  Denies any fevers/N/V/D.

## 2019-05-15 NOTE — Discharge Instructions (Addendum)
Please follow-up with your pulmonologist and keep your appointment to have further testing for your lungs.  Please use the prednisone once daily as prescribed take it in the morning with food.

## 2019-05-16 ENCOUNTER — Telehealth: Payer: Self-pay | Admitting: Internal Medicine

## 2019-05-16 LAB — NOVEL CORONAVIRUS, NAA (HOSP ORDER, SEND-OUT TO REF LAB; TAT 18-24 HRS): SARS-CoV-2, NAA: NOT DETECTED

## 2019-05-16 NOTE — Telephone Encounter (Signed)
Left message for patient to call back.

## 2019-05-17 ENCOUNTER — Telehealth: Payer: Self-pay | Admitting: Internal Medicine

## 2019-05-17 NOTE — Telephone Encounter (Signed)
Patient notified of the results

## 2019-05-17 NOTE — Telephone Encounter (Signed)
lmtcb X2 for pt.  

## 2019-05-18 ENCOUNTER — Telehealth: Payer: Self-pay | Admitting: Internal Medicine

## 2019-05-18 NOTE — Telephone Encounter (Signed)
Dr. Chase Caller,   This patient was last seen by you on 05/09/19 of ILD. The patient was seen in ED 05/15/19 for SOB.   She is currently scheduled for a PFT and research visit on 06/30/19 in addition to an appointment with you to discuss the PFT results on 07/04/19. Is there any reason why these appointments should have to be moved up (if they could even be moved up due to the PFT schedule)?

## 2019-05-19 NOTE — Telephone Encounter (Signed)
Holly Mueller can come and see me or an app and we can discuss empiric steroids to see if it helps her ILD. I d/w Dr Carlean Purl and due to liver disease and low platelets her Rx options are very limited  Keep the PFT on schedule without change

## 2019-05-19 NOTE — Telephone Encounter (Signed)
Thanks and noted. I wil prob do prednisone  MR  PS: reply not needed

## 2019-05-21 NOTE — Progress Notes (Signed)
error This encounter was created in error - please disregard. This encounter was created in error - please disregard.

## 2019-05-22 ENCOUNTER — Inpatient Hospital Stay (HOSPITAL_COMMUNITY)
Admission: EM | Admit: 2019-05-22 | Discharge: 2019-05-27 | DRG: 196 | Disposition: A | Discharge status: Hospice | Payer: Medicaid Other | Attending: Family Medicine | Admitting: Family Medicine

## 2019-05-22 ENCOUNTER — Telehealth: Payer: Self-pay | Admitting: Hematology and Oncology

## 2019-05-22 ENCOUNTER — Encounter (HOSPITAL_COMMUNITY): Payer: Self-pay | Admitting: Emergency Medicine

## 2019-05-22 ENCOUNTER — Emergency Department (HOSPITAL_COMMUNITY): Payer: Medicaid Other

## 2019-05-22 ENCOUNTER — Telehealth: Payer: Self-pay | Admitting: Internal Medicine

## 2019-05-22 ENCOUNTER — Inpatient Hospital Stay: Payer: Medicaid Other | Admitting: Hematology and Oncology

## 2019-05-22 ENCOUNTER — Inpatient Hospital Stay: Payer: Medicaid Other

## 2019-05-22 ENCOUNTER — Other Ambulatory Visit: Payer: Self-pay

## 2019-05-22 ENCOUNTER — Telehealth: Payer: Self-pay

## 2019-05-22 ENCOUNTER — Encounter: Payer: Self-pay | Admitting: Primary Care

## 2019-05-22 ENCOUNTER — Encounter: Payer: Medicaid Other | Admitting: Primary Care

## 2019-05-22 DIAGNOSIS — K219 Gastro-esophageal reflux disease without esophagitis: Secondary | ICD-10-CM | POA: Diagnosis present

## 2019-05-22 DIAGNOSIS — Z7951 Long term (current) use of inhaled steroids: Secondary | ICD-10-CM

## 2019-05-22 DIAGNOSIS — J84112 Idiopathic pulmonary fibrosis: Principal | ICD-10-CM | POA: Diagnosis present

## 2019-05-22 DIAGNOSIS — R911 Solitary pulmonary nodule: Secondary | ICD-10-CM

## 2019-05-22 DIAGNOSIS — I454 Nonspecific intraventricular block: Secondary | ICD-10-CM | POA: Diagnosis present

## 2019-05-22 DIAGNOSIS — K743 Primary biliary cirrhosis: Secondary | ICD-10-CM | POA: Diagnosis present

## 2019-05-22 DIAGNOSIS — Z886 Allergy status to analgesic agent status: Secondary | ICD-10-CM

## 2019-05-22 DIAGNOSIS — Z90711 Acquired absence of uterus with remaining cervical stump: Secondary | ICD-10-CM

## 2019-05-22 DIAGNOSIS — J302 Other seasonal allergic rhinitis: Secondary | ICD-10-CM | POA: Diagnosis present

## 2019-05-22 DIAGNOSIS — Z8249 Family history of ischemic heart disease and other diseases of the circulatory system: Secondary | ICD-10-CM

## 2019-05-22 DIAGNOSIS — Z79899 Other long term (current) drug therapy: Secondary | ICD-10-CM

## 2019-05-22 DIAGNOSIS — Z20822 Contact with and (suspected) exposure to covid-19: Secondary | ICD-10-CM | POA: Diagnosis present

## 2019-05-22 DIAGNOSIS — J9621 Acute and chronic respiratory failure with hypoxia: Secondary | ICD-10-CM | POA: Diagnosis present

## 2019-05-22 DIAGNOSIS — J8489 Other specified interstitial pulmonary diseases: Secondary | ICD-10-CM | POA: Diagnosis present

## 2019-05-22 DIAGNOSIS — J189 Pneumonia, unspecified organism: Secondary | ICD-10-CM

## 2019-05-22 DIAGNOSIS — R053 Chronic cough: Secondary | ICD-10-CM

## 2019-05-22 DIAGNOSIS — Z801 Family history of malignant neoplasm of trachea, bronchus and lung: Secondary | ICD-10-CM

## 2019-05-22 DIAGNOSIS — Z823 Family history of stroke: Secondary | ICD-10-CM

## 2019-05-22 DIAGNOSIS — Z87442 Personal history of urinary calculi: Secondary | ICD-10-CM

## 2019-05-22 DIAGNOSIS — Z6839 Body mass index (BMI) 39.0-39.9, adult: Secondary | ICD-10-CM

## 2019-05-22 DIAGNOSIS — D61818 Other pancytopenia: Secondary | ICD-10-CM | POA: Diagnosis present

## 2019-05-22 DIAGNOSIS — R0603 Acute respiratory distress: Secondary | ICD-10-CM

## 2019-05-22 DIAGNOSIS — I517 Cardiomegaly: Secondary | ICD-10-CM | POA: Diagnosis present

## 2019-05-22 DIAGNOSIS — Z79890 Hormone replacement therapy (postmenopausal): Secondary | ICD-10-CM

## 2019-05-22 DIAGNOSIS — E1165 Type 2 diabetes mellitus with hyperglycemia: Secondary | ICD-10-CM | POA: Diagnosis present

## 2019-05-22 DIAGNOSIS — Z23 Encounter for immunization: Secondary | ICD-10-CM

## 2019-05-22 DIAGNOSIS — Z833 Family history of diabetes mellitus: Secondary | ICD-10-CM

## 2019-05-22 DIAGNOSIS — R05 Cough: Secondary | ICD-10-CM

## 2019-05-22 DIAGNOSIS — E039 Hypothyroidism, unspecified: Secondary | ICD-10-CM | POA: Diagnosis present

## 2019-05-22 DIAGNOSIS — Z8379 Family history of other diseases of the digestive system: Secondary | ICD-10-CM

## 2019-05-22 DIAGNOSIS — E669 Obesity, unspecified: Secondary | ICD-10-CM | POA: Diagnosis present

## 2019-05-22 DIAGNOSIS — Z515 Encounter for palliative care: Secondary | ICD-10-CM | POA: Diagnosis not present

## 2019-05-22 DIAGNOSIS — Z9981 Dependence on supplemental oxygen: Secondary | ICD-10-CM

## 2019-05-22 DIAGNOSIS — Z7984 Long term (current) use of oral hypoglycemic drugs: Secondary | ICD-10-CM

## 2019-05-22 LAB — CBG MONITORING, ED
Glucose-Capillary: 139 mg/dL — ABNORMAL HIGH (ref 70–99)
Glucose-Capillary: 179 mg/dL — ABNORMAL HIGH (ref 70–99)

## 2019-05-22 LAB — BASIC METABOLIC PANEL
Anion gap: 6 (ref 5–15)
BUN: 9 mg/dL (ref 6–20)
CO2: 22 mmol/L (ref 22–32)
Calcium: 8.7 mg/dL — ABNORMAL LOW (ref 8.9–10.3)
Chloride: 108 mmol/L (ref 98–111)
Creatinine, Ser: 0.66 mg/dL (ref 0.44–1.00)
GFR calc Af Amer: >60 mL/min (ref >60)
GFR calc non Af Amer: >60 mL/min (ref >60)
Glucose, Bld: 297 mg/dL — ABNORMAL HIGH (ref 70–99)
Potassium: 3.7 mmol/L (ref 3.5–5.1)
Sodium: 136 mmol/L (ref 135–145)

## 2019-05-22 LAB — CBC
HCT: 32.3 % — ABNORMAL LOW (ref 36.0–46.0)
Hemoglobin: 10.1 g/dL — ABNORMAL LOW (ref 12.0–15.0)
MCH: 27.2 pg (ref 26.0–34.0)
MCHC: 31.3 g/dL (ref 30.0–36.0)
MCV: 87.1 fL (ref 80.0–100.0)
Platelets: DECREASED 10*3/uL (ref 150–400)
RBC: 3.71 MIL/uL — ABNORMAL LOW (ref 3.87–5.11)
RDW: 14.5 % (ref 11.5–15.5)
WBC: 1.6 10*3/uL — ABNORMAL LOW (ref 4.0–10.5)
nRBC: 0 % (ref 0.0–0.2)

## 2019-05-22 LAB — I-STAT BETA HCG BLOOD, ED (MC, WL, AP ONLY): I-stat hCG, quantitative: <5 m[IU]/mL (ref <5)

## 2019-05-22 LAB — HIV ANTIBODY (ROUTINE TESTING W REFLEX): HIV Screen 4th Generation wRfx: NONREACTIVE

## 2019-05-22 LAB — TROPONIN I (HIGH SENSITIVITY)
Troponin I (High Sensitivity): 3 ng/L (ref <18)
Troponin I (High Sensitivity): 3 ng/L (ref <18)

## 2019-05-22 LAB — POC SARS CORONAVIRUS 2 AG -  ED: SARS Coronavirus 2 Ag: NEGATIVE

## 2019-05-22 MED ORDER — IPRATROPIUM BROMIDE HFA 17 MCG/ACT IN AERS
2.0000 | INHALATION_SPRAY | RESPIRATORY_TRACT | Status: DC
  Administered 2019-05-22 – 2019-05-23 (×4): 2 via RESPIRATORY_TRACT
  Filled 2019-05-22: qty 12.9

## 2019-05-22 MED ORDER — IPRATROPIUM BROMIDE HFA 17 MCG/ACT IN AERS
4.0000 | INHALATION_SPRAY | Freq: Once | RESPIRATORY_TRACT | Status: AC
  Administered 2019-05-22: 4 via RESPIRATORY_TRACT
  Filled 2019-05-22: qty 12.9

## 2019-05-22 MED ORDER — LORATADINE 10 MG PO TABS
10.0000 mg | ORAL_TABLET | Freq: Every day | ORAL | Status: DC
  Administered 2019-05-23 – 2019-05-27 (×5): 10 mg via ORAL
  Filled 2019-05-22 (×5): qty 1

## 2019-05-22 MED ORDER — LEVOTHYROXINE SODIUM 50 MCG PO TABS
250.0000 ug | ORAL_TABLET | Freq: Every day | ORAL | Status: DC
  Administered 2019-05-23 – 2019-05-27 (×5): 250 ug via ORAL
  Filled 2019-05-22 (×3): qty 2
  Filled 2019-05-22: qty 1
  Filled 2019-05-22: qty 2

## 2019-05-22 MED ORDER — AEROCHAMBER PLUS FLO-VU LARGE MISC: 1.0000 | Freq: Once | Status: DC

## 2019-05-22 MED ORDER — BENZONATATE 100 MG PO CAPS
100.0000 mg | ORAL_CAPSULE | Freq: Three times a day (TID) | ORAL | Status: DC
  Administered 2019-05-22 – 2019-05-27 (×14): 100 mg via ORAL
  Filled 2019-05-22 (×14): qty 1

## 2019-05-22 MED ORDER — DICLOFENAC SODIUM 1 % EX GEL
4.0000 g | Freq: Four times a day (QID) | CUTANEOUS | Status: DC | PRN
  Filled 2019-05-22: qty 100

## 2019-05-22 MED ORDER — URSODIOL 300 MG PO CAPS
600.0000 mg | ORAL_CAPSULE | Freq: Three times a day (TID) | ORAL | Status: DC
  Administered 2019-05-22 – 2019-05-27 (×14): 600 mg via ORAL
  Filled 2019-05-22 (×16): qty 2

## 2019-05-22 MED ORDER — INSULIN ASPART 100 UNIT/ML ~~LOC~~ SOLN
0.0000 [IU] | Freq: Three times a day (TID) | SUBCUTANEOUS | Status: DC
  Administered 2019-05-23: 9 [IU] via SUBCUTANEOUS

## 2019-05-22 MED ORDER — SODIUM CHLORIDE 0.9 % IV SOLN: 2.0000 g | INTRAVENOUS | Status: DC

## 2019-05-22 MED ORDER — ALBUTEROL SULFATE HFA 108 (90 BASE) MCG/ACT IN AERS
2.0000 | INHALATION_SPRAY | RESPIRATORY_TRACT | Status: DC
  Administered 2019-05-22 – 2019-05-23 (×5): 2 via RESPIRATORY_TRACT
  Filled 2019-05-22: qty 6.7

## 2019-05-22 MED ORDER — DEXAMETHASONE SODIUM PHOSPHATE 10 MG/ML IJ SOLN
6.0000 mg | INTRAMUSCULAR | Status: DC
  Administered 2019-05-22: 6 mg via INTRAVENOUS
  Filled 2019-05-22: qty 0.6
  Filled 2019-05-22: qty 1

## 2019-05-22 MED ORDER — SODIUM CHLORIDE 0.9 % IV SOLN: 500.0000 mg | INTRAVENOUS | Status: DC

## 2019-05-22 MED ORDER — ALBUTEROL SULFATE HFA 108 (90 BASE) MCG/ACT IN AERS
8.0000 | INHALATION_SPRAY | Freq: Once | RESPIRATORY_TRACT | Status: AC
  Administered 2019-05-22: 8 via RESPIRATORY_TRACT
  Filled 2019-05-22: qty 6.7

## 2019-05-22 MED ORDER — PANTOPRAZOLE SODIUM 40 MG PO TBEC
40.0000 mg | DELAYED_RELEASE_TABLET | Freq: Two times a day (BID) | ORAL | Status: DC
  Administered 2019-05-22 – 2019-05-27 (×10): 40 mg via ORAL
  Filled 2019-05-22 (×10): qty 1

## 2019-05-22 MED ORDER — SODIUM CHLORIDE 0.9 % IV SOLN
500.0000 mg | INTRAVENOUS | Status: DC
  Filled 2019-05-22: qty 500

## 2019-05-22 MED ORDER — SODIUM CHLORIDE 0.9% FLUSH
3.0000 mL | Freq: Once | INTRAVENOUS | Status: AC
  Administered 2019-05-22: 3 mL via INTRAVENOUS

## 2019-05-22 MED ORDER — SODIUM CHLORIDE 0.9 % IV SOLN
2.0000 g | INTRAVENOUS | Status: DC
  Filled 2019-05-22: qty 20

## 2019-05-22 MED ORDER — ENOXAPARIN SODIUM 40 MG/0.4ML ~~LOC~~ SOLN
40.0000 mg | SUBCUTANEOUS | Status: DC
  Administered 2019-05-22 – 2019-05-23 (×2): 40 mg via SUBCUTANEOUS
  Filled 2019-05-22 (×2): qty 0.4

## 2019-05-22 MED ORDER — SODIUM CHLORIDE 0.9 % IV SOLN
500.0000 mg | Freq: Once | INTRAVENOUS | Status: AC
  Administered 2019-05-22: 500 mg via INTRAVENOUS
  Filled 2019-05-22: qty 500

## 2019-05-22 MED ORDER — SODIUM CHLORIDE 0.9 % IV SOLN
1.0000 g | Freq: Once | INTRAVENOUS | Status: AC
  Administered 2019-05-22: 1 g via INTRAVENOUS
  Filled 2019-05-22: qty 10

## 2019-05-22 MED ORDER — MOMETASONE FURO-FORMOTEROL FUM 100-5 MCG/ACT IN AERO
2.0000 | INHALATION_SPRAY | Freq: Two times a day (BID) | RESPIRATORY_TRACT | Status: DC
  Administered 2019-05-22 – 2019-05-27 (×9): 2 via RESPIRATORY_TRACT
  Filled 2019-05-22: qty 8.8

## 2019-05-22 NOTE — ED Notes (Signed)
ED TO INPATIENT HANDOFF REPORT  ED Nurse Name and Phone #:   S Name/Age/Gender Holly Mueller 54 y.o. female Room/Bed: 039C/039C  Code Status   Code Status: Full Code  Home/SNF/Other Home Patient oriented to: self, place, time and situation Is this baseline? No   Triage Complete: Triage complete  Chief Complaint Pneumonia [J18.9]  Triage Note Pt reports tightness in chest x1 week with productive cough and body aches. Was tested - for covid on Sunday per pt. Pt wears 2L o2 at home     Allergies Allergies  Allergen Reactions  . Aspirin Nausea Only    Upset stomach    Level of Care/Admitting Diagnosis ED Disposition    ED Disposition Condition Comment   Admit  Hospital Area: [facility] [[id number]]  Level of Care: Med-Surg [16]  Covid Evaluation: Symptomatic Person Under Investigation (PUI)  Diagnosis: Pneumonia [759163]  Admitting Physician: Stark Klein [[id number]]  Attending Physician: Leeanne Rio [phone]       B Medical/Surgery History Past Medical History:  Diagnosis Date  . Acute respiratory failure with hypoxia ([place]) 09/22/2018  . Anxiety    "anxiety attacks- sometimes"  . Arthritis    bursitis left hip flares-not an issue  . Chronic kidney disease   . Community acquired pneumonia 11/28/2018  . Diabetes mellitus without complication ([place])    Type II  . Dysrhythmia   . Edentulous    10-19-13 at present  . Epilepsy ([place]) in 1972   No seizures since 1972. Previously treated with phenobarbital.   . Gastric varices with bleeding   . GERD (gastroesophageal reflux disease) 2008  . 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 Headache(784.0)    hx of migraines   . Heart murmur   . 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 History of blood transfusion    gi bleed  . History of kidney stones    passed  . Hypothyroidism 2008  . Interstitial lung  disease ([place])   . Leukopenia   . Lower esophageal ring 08/18/2013  . Pneumonia   . Primary biliary cholangitis ([place]) 10/24/2018  . Seasonal allergies 2003  . SEIZURES, HX OF 12/12/2009   Annotation: last seizure in early 1970s Qualifier: Diagnosis of  By: Carlena Sax  MD, Colletta [place]    . Shortness of breath    walking  . Wears glasses    Past Surgical History:  Procedure Laterality Date  . ABDOMINAL HYSTERECTOMY     " partial "  . BALLOON DILATION N/A 10/24/2013   Procedure: BALLOON DILATION;  Surgeon: Gatha Mayer, MD;  Location: [facility];  Service: Endoscopy;  Laterality: N/A;  . BIOPSY  09/26/2018   Procedure: BIOPSY;  Surgeon: Thornton Park, MD;  Location: [facility];  Service: Gastroenterology;;  . BIOPSY  01/09/2019   Procedure: BIOPSY;  Surgeon: Irving Copas., MD;  Location: [facility];  Service: Gastroenterology;;  . CESAREAN SECTION     x2  . DENTAL SURGERY     multiple extractions 3'15  . DILATION AND CURETTAGE OF UTERUS    . ESOPHAGOGASTRODUODENOSCOPY N/A 10/24/2013   Procedure: ESOPHAGOGASTRODUODENOSCOPY (EGD);  Surgeon: Gatha Mayer, MD;  Location: [facility];  Service: Endoscopy;  Laterality: N/A;  . ESOPHAGOGASTRODUODENOSCOPY N/A 01/09/2019   Procedure: ESOPHAGOGASTRODUODENOSCOPY (EGD);  Surgeon: Irving Copas., MD;  Location: [facility];  Service: Gastroenterology;  Laterality: N/A;  . ESOPHAGOGASTRODUODENOSCOPY (EGD) WITH PROPOFOL  N/A 09/26/2018   Procedure: ESOPHAGOGASTRODUODENOSCOPY (EGD) WITH PROPOFOL;  Surgeon: Thornton Park, MD;  Location: [facility];  Service: Gastroenterology;  Laterality: N/A;  . ESOPHAGOGASTRODUODENOSCOPY (EGD) WITH PROPOFOL N/A 04/26/2019   Procedure: ESOPHAGOGASTRODUODENOSCOPY (EGD) WITH PROPOFOL;  Surgeon: Rush Landmark Telford Nab., MD;  Location: [facility];  Service: Gastroenterology;  Laterality: N/A;  . HEMOSTASIS CLIP PLACEMENT  04/26/2019   Procedure: HEMOSTASIS CLIP PLACEMENT;  Surgeon: Irving Copas., MD;  Location: [facility];  Service: Gastroenterology;;  . IR PARACENTESIS  09/23/2018  . LIVER BIOPSY  06/30/2012   Procedure: LIVER BIOPSY;  Surgeon: Shann Medal, MD;  Location: [facility];  Service: General;;  . NOVASURE ABLATION    . SCLEROTHERAPY  04/26/2019   Procedure: Clide Deutscher;  Surgeon: Mansouraty, Telford Nab., MD;  Location: [facility];  Service: Gastroenterology;;  cyanoacrylate  . SHOULDER ARTHROSCOPY Right 2011  . UMBILICAL HERNIA REPAIR N/A 06/30/2012   Procedure: remove umbilicus;  Surgeon: Shann Medal, MD;  [facility];  Service: General;  Laterality: N/A;  . UPPER ESOPHAGEAL ENDOSCOPIC ULTRASOUND (EUS) N/A 01/09/2019   Procedure: UPPER ESOPHAGEAL ENDOSCOPIC ULTRASOUND (EUS);  Surgeon: Irving Copas., MD;  Location: [facility];  Service: Gastroenterology;  Laterality: N/A;  . VENTRAL HERNIA REPAIR N/A 06/30/2012   Procedure: LAPAROSCOPIC VENTRAL HERNIA;  Surgeon: Shann Medal, MD;  [facility];  Service: General;  Laterality: N/A;  With Mesh  . VIDEO BRONCHOSCOPY Bilateral 07/20/2018   Procedure: VIDEO BRONCHOSCOPY WITHOUT FLUORO;  Surgeon: Brand Males, MD;  [facility];  Service: Cardiopulmonary;  Laterality: Bilateral;  . WISDOM TOOTH EXTRACTION       A IV Location/Drains/Wounds Patient Lines/Drains/Airways Status   Active Line/Drains/Airways    Name:   Placement date:   Placement time:   Site:   Days:   Peripheral IV 05/22/19 Right Antecubital   05/22/19    1719    Antecubital   less than 1          Intake/Output Last 24 hours No intake or output data in the 24 hours ending 05/22/19 1842  Labs/Imaging Results for orders placed or performed during the hospital encounter of 05/22/19 (from the past 48 hour(s))  Basic metabolic panel     Status: Abnormal   Collection Time: 05/22/19 12:45 PM  Result Value Ref Range   Sodium 136 135 - 145 mmol/L   Potassium 3.7 3.5 - 5.1 mmol/L   Chloride 108 98 - 111  mmol/L   CO2 22 22 - 32 mmol/L   Glucose, Bld 297 (H) 70 - 99 mg/dL   BUN 9 6 - 20 mg/dL   Creatinine, Ser 0.66 0.44 - 1.00 mg/dL   Calcium 8.7 (L) 8.9 - 10.3 mg/dL   GFR calc non Af Amer >60 >60 mL/min   GFR calc Af Amer >60 >60 mL/min   Anion gap 6 5 - 15    Comment: Performed at [facility], [place] [address], [place], [place] [address]  CBC     Status: Abnormal   Collection Time: 05/22/19 12:45 PM  Result Value Ref Range   WBC 1.6 (L) 4.0 - 10.5 K/uL    Comment: WHITE COUNT CONFIRMED ON SMEAR   RBC 3.71 (L) 3.87 - 5.11 MIL/uL   Hemoglobin 10.1 (L) 12.0 - 15.0 g/dL   HCT 32.3 (L) 36.0 - 46.0 %   MCV 87.1 80.0 - 100.0 fL   MCH 27.2 26.0 - 34.0 pg   MCHC 31.3 30.0 - 36.0 g/dL  RDW 14.5 11.5 - 15.5 %   Platelets  150 - 400 K/uL    PLATELET CLUMPS NOTED ON SMEAR, COUNT APPEARS DECREASED   nRBC 0.0 0.0 - 0.2 %    Comment: Performed at [facility] [address] [place], [place] [address]  Troponin I (High Sensitivity)     Status: None   Collection Time: 05/22/19 12:45 PM  Result Value Ref Range   Troponin I (High Sensitivity) 3 <18 ng/L    Comment: (NOTE) Elevated high sensitivity troponin I (hsTnI) values and significant  changes across serial measurements may suggest ACS but many other  chronic and acute conditions are known to elevate hsTnI results.  Refer to the "Links" section for chest pain algorithms and additional  guidance. Performed at [facility], [place] [address] [place], [place] [address]   I-Stat beta hCG blood, ED     Status: None   Collection Time: 05/22/19 12:52 PM  Result Value Ref Range   I-stat hCG, quantitative <5.0 <5 mIU/mL   Comment 3            Comment:   GEST. AGE      CONC.  (mIU/mL)   <=1 WEEK        5 - 50     2 WEEKS       50 - 500     3 WEEKS       100 - 10,000     4 WEEKS     1,000 - 30,000        FEMALE AND NON-PREGNANT FEMALE:     LESS THAN 5 mIU/mL   Troponin I (High Sensitivity)     Status: None   Collection  Time: 05/22/19  5:19 PM  Result Value Ref Range   Troponin I (High Sensitivity) 3 <18 ng/L    Comment: (NOTE) Elevated high sensitivity troponin I (hsTnI) values and significant  changes across serial measurements may suggest ACS but many other  chronic and acute conditions are known to elevate hsTnI results.  Refer to the "Links" section for chest pain algorithms and additional  guidance. Performed at [facility], [place] [address] [place], [place] [address]   POC SARS Coronavirus 2 Ag-ED - Nasal Swab (BD Veritor Kit)     Status: None   Collection Time: 05/22/19  6:33 PM  Result Value Ref Range   SARS Coronavirus 2 Ag NEGATIVE NEGATIVE    Comment: (NOTE) SARS-CoV-2 antigen NOT DETECTED.  Negative results are presumptive.  Negative results do not preclude SARS-CoV-2 infection and should not be used as the sole basis for treatment or other patient management decisions, including infection  control decisions, particularly in the presence of clinical signs and  symptoms consistent with COVID-19, or in those who have been in contact with the virus.  Negative results must be combined with clinical observations, patient history, and epidemiological information. The expected result is Negative. Fact Sheet for Patients: PodPark.tn Fact Sheet for Healthcare Providers: GiftContent.is This test is not yet approved or cleared by the [place] FDA and  has been authorized for detection and/or diagnosis of SARS-CoV-2 by FDA under an Emergency Use Authorization (EUA).  This EUA will remain in effect (meaning this test can be used) for the duration of  the COVID-19 de claration under Section 564(b)(1) of the Act, 21 U.S.C. section 360bbb-3(b)(1), unless the authorization is terminated or revoked sooner.    DG Chest Port 1 View  Result Date:  05/22/2019 CLINICAL DATA:  Cough. EXAM: PORTABLE CHEST 1 VIEW COMPARISON:  May 15, 2019. FINDINGS: Stable cardiomediastinal silhouette. No pneumothorax is noted. Right lung is clear. Minimal left basilar subsegmental atelectasis or infiltrate is noted. No significant pleural effusion is noted. Bony thorax is unremarkable. IMPRESSION: Minimal left basilar subsegmental atelectasis or infiltrate is noted. No significant pleural effusion is noted. Electronically Signed   By: Marijo Conception M.D.   On: 05/22/2019 15:30    Pending Labs Unresulted Labs (From admission, onward)    Start     Ordered   05/29/19 0500  Creatinine, serum  (enoxaparin (LOVENOX)    CrCl >/= 30 ml/min)  Weekly,   R    Comments: while on enoxaparin therapy    05/22/19 1811   05/23/19 0500  Comprehensive metabolic panel  Tomorrow morning,   R     05/22/19 1811   05/23/19 0500  CBC WITH DIFFERENTIAL  Tomorrow morning,   R     05/22/19 1811   05/22/19 1733  Culture, blood (routine x 2) Call MD if unable to obtain prior to antibiotics being given  BLOOD CULTURE X 2,   R (with STAT occurrences)    Comments: If blood cultures drawn in Emergency Department - Do not draw and cancel order    05/22/19 1811   05/22/19 1730  HIV Antibody (routine testing w rflx)  (HIV Antibody (Routine testing w reflex) panel)  Once,   STAT     05/22/19 1811          Vitals/Pain Today's Vitals   05/22/19 1645 05/22/19 1700 05/22/19 1715 05/22/19 1833  BP: (!) 143/79 129/83 126/77 123/77  Pulse: 94 91 (!) 102 93  Resp: (!) _0 Temp:      TempSrc:      SpO2: 96% 96% 97% 95%  PainSc:        Isolation Precautions Airborne and Contact precautions  Medications Medications  AeroChamber Plus Flo-Vu Large MISC 1 each (has no administration in time range)  levothyroxine (SYNTHROID) tablet 250 mcg (has no administration in time range)  pantoprazole (PROTONIX) EC tablet 40 mg (has no administration in time range)  benzonatate (TESSALON) capsule 100 mg (has no administration in time range)  mometasone-formoterol  (DULERA) 100-5 MCG/ACT inhaler 2 puff (has no administration in time range)  loratadine (CLARITIN) tablet 10 mg (has no administration in time range)  enoxaparin (LOVENOX) injection 40 mg (has no administration in time range)  dexamethasone (DECADRON) injection 6 mg (has no administration in time range)  ipratropium (ATROVENT HFA) inhaler 2 puff (has no administration in time range)  albuterol (VENTOLIN HFA) 108 (90 Base) MCG/ACT inhaler 2 puff (has no administration in time range)  ursodiol (ACTIGALL) tablet 500 mg (has no administration in time range)  azithromycin (ZITHROMAX) 500 mg in sodium chloride 0.9 % 250 mL IVPB (has no administration in time range)  cefTRIAXone (ROCEPHIN) 2 g in sodium chloride 0.9 % 100 mL IVPB (has no administration in time range)  sodium chloride flush (NS) 0.9 % injection 3 mL (3 mLs Intravenous Given 05/22/19 1721)  albuterol (VENTOLIN HFA) 108 (90 Base) MCG/ACT inhaler 8 puff (8 puffs Inhalation Given 05/22/19 1721)  ipratropium (ATROVENT HFA) inhaler 4 puff (4 puffs Inhalation Given 05/22/19 1721)  cefTRIAXone (ROCEPHIN) 1 g in sodium chloride 0.9 % 100 mL IVPB (0 g Intravenous Stopped 05/22/19 1800)  azithromycin (ZITHROMAX) 500 mg in sodium chloride 0.9 % 250 mL IVPB (0 mg Intravenous Stopped  05/22/19 1840)    Mobility walks Moderate fall risk   Focused Assessments Pulmonary Assessment Handoff:  Lung sounds:   O2 Device: Nasal Cannula O2 Flow Rate (L/min): 2 L/min      R Recommendations: See Admitting Provider Note  Report given to:   Additional Notes:

## 2019-05-22 NOTE — Telephone Encounter (Signed)
Pt had televisit with Beth today  She already has a reminder in place for f/u with PFT

## 2019-05-22 NOTE — Telephone Encounter (Signed)
See later phone notes. Pt has televisit with Derl Barrow, NP, today. Will sign off.

## 2019-05-22 NOTE — ED Provider Notes (Signed)
[facility] EMERGENCY DEPARTMENT Provider Note   CSN: [id number] Arrival date & time: 05/22/19  1205     History Chief Complaint  Patient presents with  . Chest Pain  . Generalized Body Aches    Holly Mueller is a 54 y.o. female with past medical history of interstitial lung disease, CKD, cirrhosis, type 2 diabetes, presenting to the emergency department with worsening shortness of breath.  Patient was evaluated on 05/15/2019 for cough with congestion and body aches.  She had negative Covid test on 05/15/2019 and clear chest x-ray.  She was discharged with symptomatic management, including prednisone and Robitussin-DM, and instructed to follow-up outpatient.  She states her symptoms of cough and body aches have somewhat worsened.  She is feeling more short of breath at rest and is requiring to use her oxygen at rest when she normally only uses it for exertion.  She is on 2 L nasal cannula.  She is reporting rib pain due to the frequent coughing which is keeping her up at night.  No fevers or abdominal complaints.  The history is provided by the patient and medical records.       Past Medical History:  Diagnosis Date  . Acute respiratory failure with hypoxia ([place]) 09/22/2018  . Anxiety    "anxiety attacks- sometimes"  . Arthritis    bursitis left hip flares-not an issue  . Chronic kidney disease   . Community acquired pneumonia 11/28/2018  . Diabetes mellitus without complication ([place])    Type II  . Dysrhythmia   . Edentulous    10-19-13 at present  . Epilepsy ([place]) in 1972   No seizures since 1972. Previously treated with phenobarbital.   . Gastric varices with bleeding   . GERD (gastroesophageal reflux disease) 2008  . 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 Headache(784.0)    hx of migraines   . Heart murmur   . 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 History of blood  transfusion    gi bleed  . History of kidney stones    passed  . Hypothyroidism 2008  . Interstitial lung disease ([place])   . Leukopenia   . Lower esophageal ring 08/18/2013  . Pneumonia   . Primary biliary cholangitis ([place]) 10/24/2018  . Seasonal allergies 2003  . SEIZURES, HX OF 12/12/2009   Annotation: last seizure in early 1970s Qualifier: Diagnosis of  By: Carlena Sax  MD, Colletta [place]    . Shortness of breath    walking  . Wears glasses     Patient Active Problem List   Diagnosis Date Noted  . Petechiae 05/01/2019  . Lower extremity edema 05/01/2019  . Irritant contact dermatitis due to detergent 03/31/2019  . Melena 03/31/2019  . Financial difficulties 03/31/2019  . Skin rash 03/17/2019  . Intertrigo 02/11/2019  . Dysuria 02/11/2019  . Chronic respiratory failure with hypoxia ([place]) 01/11/2019  . Inadequate housing 12/05/2018  . Primary biliary cholangitis ([place]) 10/24/2018  . Gastric varices with bleeding   . Anemia 09/22/2018  . Pancytopenia ([place]) 08/28/2018  . Does not have health insurance 04/04/2018  . ILD (interstitial lung disease) ([place]) 03/23/2018  . Type 2 diabetes mellitus without complication, without long-term current use of insulin ([place])   . 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 GERD (gastroesophageal reflux disease) 10/23/2016  . Cough 12/09/2015  . Precordial chest pain 08/03/2015  .  DOE (dyspnea on exertion) 08/17/2013  . Ganglion cyst of wrist 08/24/2012  . Right hip pain 08/23/2012  . Complex care coordination 02/14/2012  . Obesity 07/08/2010  . Shortness of breath 12/12/2009  . Depression 07/26/2008  . Hypothyroidism 10/12/2006  . Restrictive lung disease 07/30/2006    Past Surgical History:  Procedure Laterality Date  . ABDOMINAL HYSTERECTOMY     " partial "  . BALLOON DILATION N/A 10/24/2013   Procedure: BALLOON DILATION;  Surgeon: Gatha Mayer, MD;  Location: [facility];  Service: Endoscopy;  Laterality: N/A;  .  BIOPSY  09/26/2018   Procedure: BIOPSY;  Surgeon: Thornton Park, MD;  Location: [facility];  Service: Gastroenterology;;  . BIOPSY  01/09/2019   Procedure: BIOPSY;  Surgeon: Irving Copas., MD;  Location: [facility];  Service: Gastroenterology;;  . CESAREAN SECTION     x2  . DENTAL SURGERY     multiple extractions 3'15  . DILATION AND CURETTAGE OF UTERUS    . ESOPHAGOGASTRODUODENOSCOPY N/A 10/24/2013   Procedure: ESOPHAGOGASTRODUODENOSCOPY (EGD);  Surgeon: Gatha Mayer, MD;  Location: [facility];  Service: Endoscopy;  Laterality: N/A;  . ESOPHAGOGASTRODUODENOSCOPY N/A 01/09/2019   Procedure: ESOPHAGOGASTRODUODENOSCOPY (EGD);  Surgeon: Irving Copas., MD;  Location: [facility];  Service: Gastroenterology;  Laterality: N/A;  . ESOPHAGOGASTRODUODENOSCOPY (EGD) WITH PROPOFOL N/A 09/26/2018   Procedure: ESOPHAGOGASTRODUODENOSCOPY (EGD) WITH PROPOFOL;  Surgeon: Thornton Park, MD;  Location: [facility];  Service: Gastroenterology;  Laterality: N/A;  . ESOPHAGOGASTRODUODENOSCOPY (EGD) WITH PROPOFOL N/A 04/26/2019   Procedure: ESOPHAGOGASTRODUODENOSCOPY (EGD) WITH PROPOFOL;  Surgeon: Rush Landmark Telford Nab., MD;  Location: [facility];  Service: Gastroenterology;  Laterality: N/A;  . HEMOSTASIS CLIP PLACEMENT  04/26/2019   Procedure: HEMOSTASIS CLIP PLACEMENT;  Surgeon: Irving Copas., MD;  Location: [facility];  Service: Gastroenterology;;  . IR PARACENTESIS  09/23/2018  . LIVER BIOPSY  06/30/2012   Procedure: LIVER BIOPSY;  Surgeon: Shann Medal, MD;  Location: [facility];  Service: General;;  . NOVASURE ABLATION    . SCLEROTHERAPY  04/26/2019   Procedure: Clide Deutscher;  Surgeon: Mansouraty, Telford Nab., MD;  Location: [facility];  Service: Gastroenterology;;  cyanoacrylate  . SHOULDER ARTHROSCOPY Right 2011  . UMBILICAL HERNIA REPAIR N/A 06/30/2012   Procedure: remove umbilicus;  Surgeon: Shann Medal, MD;  [facility];  Service: General;   Laterality: N/A;  . UPPER ESOPHAGEAL ENDOSCOPIC ULTRASOUND (EUS) N/A 01/09/2019   Procedure: UPPER ESOPHAGEAL ENDOSCOPIC ULTRASOUND (EUS);  Surgeon: Irving Copas., MD;  Location: [facility];  Service: Gastroenterology;  Laterality: N/A;  . VENTRAL HERNIA REPAIR N/A 06/30/2012   Procedure: LAPAROSCOPIC VENTRAL HERNIA;  Surgeon: Shann Medal, MD;  [facility];  Service: General;  Laterality: N/A;  With Mesh  . VIDEO BRONCHOSCOPY Bilateral 07/20/2018   Procedure: VIDEO BRONCHOSCOPY WITHOUT FLUORO;  Surgeon: Brand Males, MD;  [facility];  Service: Cardiopulmonary;  Laterality: Bilateral;  . WISDOM TOOTH EXTRACTION       OB History   No obstetric history on file.     Family History  Problem Relation Age of Onset  . Hypertension Mother   . Diabetes Mother   . Cirrhosis Mother        ? medications  . Lung cancer Father   . Stroke Father   . Diabetes Sister   . Hypertension Sister   . Colon cancer Neg Hx   . Esophageal cancer Neg Hx   . Rectal cancer Neg Hx     Social History  Tobacco Use  . Smoking status: Never Smoker  . Smokeless tobacco: Never Used  . Tobacco comment: 04/25/2019- "I smoke part of a cigarette and put it out,I did not like it."  Substance Use Topics  . Alcohol use: Not Currently  . Drug use: No    Home Medications Prior to Admission medications   Medication Sig Start Date End Date Taking? Authorizing Provider  acetaminophen (TYLENOL) 500 MG tablet Take 1,000 mg by mouth every 6 (six) hours as needed for mild pain.    [provider]  albuterol (PROAIR HFA) 108 (90 Base) MCG/ACT inhaler INHALE 2 PUFFS INTO THE LUNGS EVERY 6 HOURS AS NEEDED FOR SHORTNESS OF BREATH Patient taking differently: Inhale 2 puffs into the lungs every 4 (four) hours as needed for wheezing or shortness of breath.  04/25/18   Rory Percy, DO  albuterol (PROVENTIL) (2.5 MG/3ML) 0.083% nebulizer solution Take 3 mLs (2.5 mg total) by  nebulization every 6 (six) hours as needed for wheezing or shortness of breath. 04/10/19   Brand Males, MD  benzonatate (TESSALON) 100 MG capsule Take 1 capsule (100 mg total) by mouth every 8 (eight) hours. 05/15/19   Tedd Sias, PA  blood glucose meter kit and supplies Dispense based on patient and insurance preference. Use up to four times daily as directed. (FOR ICD-10 E10.9, E11.9). 09/02/18   Dessa Phi, DO  budesonide-formoterol (SYMBICORT) 80-4.5 MCG/ACT inhaler Inhale 2 puffs into the lungs 2 (two) times daily for 1 day. 01/11/19 03/08/20  Lauraine Rinne, NP  cetirizine (ZYRTEC) 10 MG tablet Take 1 tablet (10 mg total) by mouth daily. 01/27/17   Glenis Smoker, MD  dextromethorphan 15 MG/5ML syrup Take 10 mLs (30 mg total) by mouth 4 (four) times daily as needed for cough. 05/15/19   Fondaw, Kathleene Hazel, PA  fluticasone (FLONASE) 50 MCG/ACT nasal spray Place 2 sprays into both nostrils daily.    [provider]  glucose blood (TRUE METRIX BLOOD GLUCOSE TEST) test strip Use as instructed 09/16/18   Glenis Smoker, MD  guaiFENesin (MUCINEX) 600 MG 12 hr tablet Take 600 mg by mouth daily.     [provider]  levothyroxine (SYNTHROID) 125 MCG tablet TAKE 2 TABLETS (250 MCG TOTAL) BY MOUTH DAILY. 04/12/19   Meccariello, Bernita Raisin, DO  metFORMIN (GLUCOPHAGE) 500 MG tablet Take 1 tablet (500 mg total) by mouth 2 (two) times daily with a meal. 12/06/18   Meccariello, Bernita Raisin, DO  ondansetron (ZOFRAN ODT) 4 MG disintegrating tablet Take 1-2 tablets (4-8 mg total) by mouth every 8 (eight) hours as needed for nausea or vomiting. 04/26/19   Mansouraty, Telford Nab., MD  OXYGEN Inhale 2 L into the lungs continuous. Use sleeping and when out and about     [provider]  pantoprazole (PROTONIX) 40 MG tablet Take 1 tablet (40 mg total) by mouth 2 (two) times daily before a meal. 10/17/18   [place]is Smoker, MD  SPIRIVA HANDIHALER 18 MCG inhalation capsule INHALE 1  CAPSULE VIA HANDIHALER ONCE DAILY AT [place] Patient taking differently: Place 18 mcg into inhaler and inhale daily.  01/09/19   Meccariello, Bernita Raisin, DO  tamsulosin (FLOMAX) 0.4 MG CAPS capsule Take 1 capsule (0.4 mg total) by mouth daily. 02/10/19   Benay Pike, MD  TRUEplus Lancets 28G MISC Use to check blood sugar as directed. 09/16/18   Glenis Smoker, MD  ursodiol (ACTIGALL) 500 MG tablet Take 1 tablet (  500 mg total) by mouth 3 (three) times daily. 10/26/18   Gatha Mayer, MD    Allergies    Aspirin  Review of Systems   Review of Systems  Constitutional: Positive for chills and fatigue.  Respiratory: Positive for cough and shortness of breath.   Musculoskeletal: Positive for myalgias (Generalized).  All other systems reviewed and are negative.   Physical Exam Updated Vital Signs BP 120/68 (BP Location: Right Arm)   Pulse 95   Temp 98.8 F (37.1 C) (Oral)   Resp (!) 24   LMP  (LMP Unknown)   SpO2 97%   Physical Exam Vitals and nursing note reviewed.  Constitutional:      Appearance: She is well-developed.  HENT:     Head: Normocephalic and atraumatic.  Eyes:     Conjunctiva/sclera: Conjunctivae normal.  Cardiovascular:     Rate and Rhythm: Normal rate and regular rhythm.  Pulmonary:     Effort: Tachypnea and accessory muscle usage present.     Comments: Patient with increased respiratory effort, tachypnea.  Patient is easily winded with short phrases.  O2 saturation 95% on 2 L.  Frequent cough.  Lungs with wheezes and fine rales in the right base Abdominal:     General: Bowel sounds are normal.     Palpations: Abdomen is soft.     Tenderness: There is no abdominal tenderness.  Musculoskeletal:     Comments: 1-2+ pretibial pitting edema bilaterally  Skin:    General: Skin is warm.  Neurological:     Mental Status: She is alert.  Psychiatric:        Behavior: Behavior normal.     ED Results / Procedures / Treatments   Labs (all  labs ordered are listed, but only abnormal results are displayed) Labs Reviewed  BASIC METABOLIC PANEL - Abnormal; Notable for the following components:      Result Value   Glucose, Bld 297 (*)    Calcium 8.7 (*)    All other components within normal limits  CBC - Abnormal; Notable for the following components:   WBC 1.6 (*)    RBC 3.71 (*)    Hemoglobin 10.1 (*)    HCT 32.3 (*)    All other components within normal limits  I-STAT BETA HCG BLOOD, ED ([facility], [facility], [facility] ONLY)  POC SARS CORONAVIRUS 2 AG -  ED  TROPONIN I (HIGH SENSITIVITY)  TROPONIN I (HIGH SENSITIVITY)    EKG None  Radiology DG Chest Port 1 View  Result Date: 05/22/2019 CLINICAL DATA:  Cough. EXAM: PORTABLE CHEST 1 VIEW COMPARISON:  May 15, 2019. FINDINGS: Stable cardiomediastinal silhouette. No pneumothorax is noted. Right lung is clear. Minimal left basilar subsegmental atelectasis or infiltrate is noted. No significant pleural effusion is noted. Bony thorax is unremarkable. IMPRESSION: Minimal left basilar subsegmental atelectasis or infiltrate is noted. No significant pleural effusion is noted. Electronically Signed   By: Marijo Conception M.D.   On: 05/22/2019 15:30    Procedures Procedures (including critical care time)  Medications Ordered in ED Medications  sodium chloride flush (NS) 0.9 % injection 3 mL (has no administration in time range)  albuterol (VENTOLIN HFA) 108 (90 Base) MCG/ACT inhaler 8 puff (has no administration in time range)  ipratropium (ATROVENT HFA) inhaler 4 puff (has no administration in time range)  AeroChamber Plus Flo-Vu Large MISC 1 each (has no administration in time range)  cefTRIAXone (ROCEPHIN) 1 g in sodium chloride 0.9 % 100 mL IVPB (has no  administration in time range)  azithromycin (ZITHROMAX) 500 mg in sodium chloride 0.9 % 250 mL IVPB (has no administration in time range)    ED Course  I have reviewed the triage vital signs and the nursing notes.  Pertinent labs & imaging  results that were available during my care of the patient were reviewed by me and considered in my medical decision making (see chart for details).    MDM Rules/Calculators/A&P                      Patient with history of interstitial lung disease, presenting to the ED for subsequent visit with worsening shortness of breath and cough.  On exam, patient has increased respiratory effort and tachypnea.  She is very winded with little speaking.  She is requiring increased oxygen at home.  Lungs with wheezes and fine rales.  Labs with persistent leukopenia, unchanged from baseline.  Chest x-ray with possible new left basilar infiltrate.  Also consider COVID-19 illness. Patient discussed with and evaluated by Dr. Lacinda Axon.  Believe patient would be appropriate for admission given respiratory distress and lack of improvement at home with symptomatic management.  She is ordered albuterol, ipratropium and community-acquired pneumonia antibiotics.  Family medicine to admit.  Holly Mueller was evaluated in Emergency Department on 05/22/2019 for the symptoms described in the history of present illness. She was evaluated in the context of the global COVID-19 pandemic, which necessitated consideration that the patient might be at risk for infection with the SARS-CoV-2 virus that causes COVID-19. Institutional protocols and algorithms that pertain to the evaluation of patients at risk for COVID-19 are in a state of rapid change based on information released by regulatory bodies including the CDC and federal and state organizations. These policies and algorithms were followed during the patient's care in the ED.  Final Clinical Impression(s) / ED Diagnoses Final diagnoses:  Respiratory distress  Community acquired pneumonia of left lower lobe of lung    Rx / DC Orders ED Discharge Orders    None       Amirra Herling, [place] N, PA-C 05/22/19 1634    Nat Christen, MD 05/23/19 [phone]

## 2019-05-22 NOTE — Telephone Encounter (Signed)
I called pt for telephone visit with [facility] and she didn't answer the phone. I left a message for her to call back. She called back and we went over her information and I advised that we would call her right back after Eustaquio Maize is done with her patient. I called pt back several times and she didn't answer. She called the office back and we offered her to be have OV at 12:00 since [facility] had an open spot. She advised Tanya up front that she would just go to the hospital because she couldn't wait until that time. I agreed that she should go to the ED. Nothing further is needed.

## 2019-05-22 NOTE — ED Triage Notes (Signed)
Pt reports tightness in chest x1 week with productive cough and body aches. Was tested - for covid on Sunday per pt. Pt wears 2L o2 at home

## 2019-05-22 NOTE — Telephone Encounter (Signed)
Pt has been rescheduled to see Dr. Lorenso Courier on 1/21 at 1pm. She is currently in the hospital.

## 2019-05-22 NOTE — Telephone Encounter (Signed)
Called house phone and spoke to a female, advised we are trying to get into contact with pt. LMTCB for pt's mobile number.

## 2019-05-22 NOTE — H&P (Addendum)
[facility] Admission History and Physical Service Pager: [phone]  Patient name: Holly Mueller Medical record number: [id number] Date of birth: [birth year] Age: 54 y.o. Gender: female  Primary Care Provider: Cleophas Dunker, DO Consultants: None Code Status: Full Code Preferred Emergency Contact: Donato Schultz, Brother  Chief Complaint: coughing and SOB  Assessment and Plan: Holly Mueller is a 54 y.o. female presenting with worsening productive cough and SOB. PMH is significant for  primary biliary cholangitis,pancytopenia, ILD, hypothyroidism, GERD, T2DM and cirrhosis.  Acute on Chronic Hypoxic Respiratory Failure suspicious for COVID vs CAP vs ILD exacerbation vs influenza  Patient resents with worsening productive cough and increasing shortness of breath for 1 week.  Patient tested negative for Covid on 05/15/19 yet continue to have worsening of symptoms including persistent cough with increased sputum production, dyspnea, and body aches. On exam, she is hemodynamically stable but patient frequently coughing and having to stop speaking often to cough and catch her breath.  Potential etiologies include Covid (initial rapid POC Covid negative however will obtain confirmatory given symptoms), community-acquired pneumonia with possible left basilar subsegmental infiltrate noted on CXR, acute flare of suspected idiopathic pulmonary fibrosis, and influenza--however ultimately suspect that her current clinical presentation is likely multifactorial to superimposed infectious etiology with poor pulmonary reserve in the setting of her IPF.  Reassuringly, patient is afebrile, however immunocompromised at baseline given chronic leukopenia. Patient also with history of interstitial lung disease and currently sees pulmonologist who was considering starting patient on chronic steroids as outpatient.  Will treat for CAP and likely concurrent acute exacerbation of her  ILD, additionally awaiting Covid/influenza results and will add remdesivir/risk stratification labs as appropriate if returns positive. -admit to med-surg for observation, attending Dr. Nori Riis -air borne and contact precautions while awaiting Covid results -continuous pulse oximetry with O2 therapy as needed -cardiac monitoring  -AM CBC, CMP -Influenza PCR -repeat COVID -Decadron 6 mg x 10 days -Dulera -f/u blood cultures -Azithromycin 523m  and CTX 2g  -Ipratropium, Albuterol, Tessalon Perles -vitals per floor -PT/OT to evaluate  -Tylenol PRN, will limit given patient's history of cirrhosis   ILD: Chronic, current worsening. 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 RChase Caller pulmonology.  Her prognosis and proposed ILD treatment with antifibrotic therapy have been challenged by her known liver cirrhosis. After discussion with her GI physician, her pulmonary team was considering starting chronic steroids for treatment however this has not been initiated yet on the outpatient setting. -Currently receiving Decadron 6 mg daily for respiratory status above -Inhaler regimen as above, holding home Spiriva while on short acting ipratropium -Encourage close follow-up with pulmonary, already scheduled to do spirometry/DLCO in the next several weeks  Pancytopenia: Chronic, stable. WBC 1.6, hemoglobin 10.1, platelets clumped on CBC.  This appears to be around her baseline.  Follows with hematology/oncology, had a bone marrow biopsy in 11/2016 that was nonspecific.  Felt to be secondary to her liver cirrhosis. -CBC in a.m., will add differential -Recommend follow-up with heme/oncology  Chest pain: Acute, stable. Suspect musculoskeletal in nature due to sharp quality and related with frequent coughing over the past week.  Doubt ACS with reassuringly high-sensitivity troponin negative X2 with no suspicious EKG findings.  Will provide topical pain relief and cough  suppression therapy to mitigate frequent coughing.  Primary Biliary Cholangitis  Hepatic Cirrhosis   home actigall.  -Actigall 500 TID   T2DM: Patient home medications include Metformin.  Glucose 297 on admit.  Last A1c  4.8 in 11/2018, likely falsely decreased in the setting of hematological abnormalities, however still has previously been well controlled around 5-6.   -CBG four times daily  -Sensitive SSI -A1c  Hypothyroidism Continue patient's home Synthroid 250 mcg daily  GERD: Continue Protonix 40  FEN/GI: carb modified  Prophylaxis: Lovenox 40  Disposition: admit to inpatient   History of Present Illness:  Holly Mueller is a 54 y.o. female presenting with worsening SOB and productive cough with bodyaches. PMHx is significant for primary biliary cholangitis, pancytopenia, ILD, hypothyroidism, GERD, T2DM and cirrhosis.   Patient presents with an approximate 1 week (since Sunday, 1/3) history of worsening cough, shortness of breath, chills, and body aches. Received prednisone 30m course on 1/4 after ED visit, didn't seem to help. At this visit, she also tested negative for COVID. She also reports that she has been having sharp left-sided chest pain with cough, no associated diaphoresis or radiation. Baseline O2 requirement is 2L with activity, generally doesn't require oxygen at rest. She has been using her 2L at all times due to breathing and sometimes increasing to 3L. Presented to the ED today because she couldn't tolerate difficulty breathing anymore. No known COVID exposure. Patient lives with her two siblings and reports staying home and limiting visitors.  Patient denies any fever taken with her home thermometer.  She does have a chronic cough, but is noticed a worsening of this with more sputum production.  Patient reports ambulating independently.   ED Course:  Started on Azithromycin and CTX    Review Of Systems: Per HPI with the following additions:   Review of  Systems  Constitutional: Positive for chills.  HENT: Positive for congestion. Negative for sore throat.   Respiratory: Positive for cough, sputum production and shortness of breath.   Cardiovascular: Positive for chest pain. Negative for orthopnea and leg swelling.  Gastrointestinal: Negative for nausea and vomiting.  Musculoskeletal: Positive for myalgias.  Neurological: Positive for headaches.    Patient Active Problem List   Diagnosis Date Noted  . Pneumonia 05/22/2019  . Petechiae 05/01/2019  . Lower extremity edema 05/01/2019  . Irritant contact dermatitis due to detergent 03/31/2019  . Melena 03/31/2019  . Financial difficulties 03/31/2019  . Skin rash 03/17/2019  . Intertrigo 02/11/2019  . Dysuria 02/11/2019  . Chronic respiratory failure with hypoxia ([facility] 01/11/2019  . Inadequate housing 12/05/2018  . Primary biliary cholangitis ([facility] 10/24/2018  . Gastric varices with bleeding   . Anemia 09/22/2018  . Pancytopenia ([facility] 08/28/2018  . Does not have health insurance 04/04/2018  . ILD (interstitial lung disease) ([facility] 03/23/2018  . Type 2 diabetes mellitus without complication, without long-term current use of insulin ([facility]   . 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 ([facility] 11/17/2016  . GERD (gastroesophageal reflux disease) 10/23/2016  . Cough 12/09/2015  . Precordial chest pain 08/03/2015  . DOE (dyspnea on exertion) 08/17/2013  . Ganglion cyst of wrist 08/24/2012  . Right hip pain 08/23/2012  . Complex care coordination 02/14/2012  . Obesity 07/08/2010  . Shortness of breath 12/12/2009  . Depression 07/26/2008  . Hypothyroidism 10/12/2006  . Restrictive lung disease 07/30/2006    Past Medical History: Past Medical History:  Diagnosis Date  . Acute respiratory failure with hypoxia ([facility] [place] 09/22/2018  . Anxiety    "anxiety attacks- sometimes"  . Arthritis    bursitis left hip flares-not an issue  . Chronic kidney disease   . Community acquired  pneumonia 11/28/2018  . Diabetes mellitus without  complication ([place])    Type II  . Dysrhythmia   . Edentulous    10-19-13 at present  . Epilepsy ([place]) in 1972   No seizures since 1972. Previously treated with phenobarbital.   . Gastric varices with bleeding   . GERD (gastroesophageal reflux disease) 2008  . 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 Headache(784.0)    hx of migraines   . Heart murmur   . 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 ([place])   . History of blood transfusion    gi bleed  . History of kidney stones    passed  . Hypothyroidism 2008  . Interstitial lung disease ([place])   . Leukopenia   . Lower esophageal ring 08/18/2013  . Pneumonia   . Primary biliary cholangitis ([place]) 10/24/2018  . Seasonal allergies 2003  . SEIZURES, HX OF 12/12/2009   Annotation: last seizure in early 1970s Qualifier: Diagnosis of  By: Carlena Sax  MD, Colletta [place]    . Shortness of breath    walking  . Wears glasses     Past Surgical History: Past Surgical History:  Procedure Laterality Date  . ABDOMINAL HYSTERECTOMY     " partial "  . BALLOON DILATION N/A 10/24/2013   Procedure: BALLOON DILATION;  Surgeon: Gatha Mayer, MD;  [facility]: [facility];  Service: Endoscopy;  Laterality: N/A;  . BIOPSY  09/26/2018   Procedure: BIOPSY;  Surgeon: Thornton Park, MD;  Location: [facility];  Service: Gastroenterology;;  . BIOPSY  01/09/2019   Procedure: BIOPSY;  Surgeon: Irving Copas., MD;  Location: [facility];  Service: Gastroenterology;;  . CESAREAN SECTION     x2  . DENTAL SURGERY     multiple extractions 3'15  . DILATION AND CURETTAGE OF UTERUS    . ESOPHAGOGASTRODUODENOSCOPY N/A 10/24/2013   Procedure: ESOPHAGOGASTRODUODENOSCOPY (EGD);  Surgeon: Gatha Mayer, MD;  [facility];  Service: Endoscopy;  Laterality: N/A;  . ESOPHAGOGASTRODUODENOSCOPY N/A 01/09/2019   Procedure: ESOPHAGOGASTRODUODENOSCOPY  (EGD);  Surgeon: Irving Copas., MD;  Location: [facility];  Service: Gastroenterology;  Laterality: N/A;  . ESOPHAGOGASTRODUODENOSCOPY (EGD) WITH PROPOFOL N/A 09/26/2018   Procedure: ESOPHAGOGASTRODUODENOSCOPY (EGD) WITH PROPOFOL;  Surgeon: Thornton Park, MD;  Location: [facility];  Service: Gastroenterology;  Laterality: N/A;  . ESOPHAGOGASTRODUODENOSCOPY (EGD) WITH PROPOFOL N/A 04/26/2019   Procedure: ESOPHAGOGASTRODUODENOSCOPY (EGD) WITH PROPOFOL;  Surgeon: Rush Landmark Telford Nab., MD;  Location: [facility];  Service: Gastroenterology;  Laterality: N/A;  . HEMOSTASIS CLIP PLACEMENT  04/26/2019   Procedure: HEMOSTASIS CLIP PLACEMENT;  Surgeon: Irving Copas., MD;  Location: [facility];  Service: Gastroenterology;;  . IR PARACENTESIS  09/23/2018  . LIVER BIOPSY  06/30/2012   Procedure: LIVER BIOPSY;  Surgeon: Shann Medal, MD;  [facility];  Service: General;;  . NOVASURE ABLATION    . SCLEROTHERAPY  04/26/2019   Procedure: Clide Deutscher;  Surgeon: Mansouraty, Telford Nab., MD;  Location: [facility];  Service: Gastroenterology;;  cyanoacrylate  . SHOULDER ARTHROSCOPY Right 2011  . UMBILICAL HERNIA REPAIR N/A 06/30/2012   Procedure: remove umbilicus;  Surgeon: Shann Medal, MD;  [facility];  Service: General;  Laterality: N/A;  . UPPER ESOPHAGEAL ENDOSCOPIC ULTRASOUND (EUS) N/A 01/09/2019   Procedure: UPPER ESOPHAGEAL ENDOSCOPIC ULTRASOUND (EUS);  Surgeon: Irving Copas., MD;  Location: [facility];  Service: Gastroenterology;  Laterality: N/A;  . VENTRAL HERNIA REPAIR N/A 06/30/2012   Procedure: LAPAROSCOPIC VENTRAL HERNIA;  Surgeon: Fenton Malling  Lucia Gaskins, MD;  [facility];  Service: General;  Laterality: N/A;  With Mesh  . VIDEO BRONCHOSCOPY Bilateral 07/20/2018   Procedure: VIDEO BRONCHOSCOPY WITHOUT FLUORO;  Surgeon: Brand Males, MD;  [facility];  Service: Cardiopulmonary;  Laterality: Bilateral;  . WISDOM TOOTH EXTRACTION       Social History: Social History   Tobacco Use  . Smoking status: Never Smoker  . Smokeless tobacco: Never Used  . Tobacco comment: 04/25/2019- "I smoke part of a cigarette and put it out,I did not like it."  Substance Use Topics  . Alcohol use: Not Currently  . Drug use: No    Family History: Family History  Problem Relation Age of Onset  . Hypertension Mother   . Diabetes Mother   . Cirrhosis Mother        ? medications  . Lung cancer Father   . Stroke Father   . Diabetes Sister   . Hypertension Sister   . Colon cancer Neg Hx   . Esophageal cancer Neg Hx   . Rectal cancer Neg Hx     Allergies and Medications: Allergies  Allergen Reactions  . Aspirin Nausea Only and Other (See Comments)    Upset stomach   No current facility-administered medications on file prior to encounter.   Current Outpatient Medications on File Prior to Encounter  Medication Sig Dispense Refill  . acetaminophen (TYLENOL) 500 MG tablet Take 1,000 mg by mouth every 6 (six) hours as needed for mild pain.    Marland Kitchen albuterol (PROAIR HFA) 108 (90 Base) MCG/ACT inhaler INHALE 2 PUFFS INTO THE LUNGS EVERY 6 HOURS AS NEEDED FOR SHORTNESS OF BREATH (Patient taking differently: Inhale 2 puffs into the lungs every 4 (four) hours as needed for wheezing or shortness of breath. ) 8.5 g 5  . albuterol (PROVENTIL) (2.5 MG/3ML) 0.083% nebulizer solution Take 3 mLs (2.5 mg total) by nebulization every 6 (six) hours as needed for wheezing or shortness of breath. 75 mL 2  . budesonide-formoterol (SYMBICORT) 80-4.5 MCG/ACT inhaler Inhale 2 puffs into the lungs 2 (two) times daily for 1 day. 1 Inhaler 0  . cetirizine (ZYRTEC) 10 MG tablet Take 1 tablet (10 mg total) by mouth daily. 30 tablet 11  . dextromethorphan-guaiFENesin (MUCINEX DM) 30-600 MG 12hr tablet Take 1 tablet by mouth 2 (two) times daily as needed for cough.    . fluticasone (FLONASE) 50 MCG/ACT nasal spray Place 2 sprays into both nostrils daily.    Marland Kitchen  levothyroxine (SYNTHROID) 125 MCG tablet TAKE 2 TABLETS (250 MCG TOTAL) BY MOUTH DAILY. (Patient taking differently: Take 125 mcg by mouth 2 (two) times daily. ) 60 tablet 3  . metFORMIN (GLUCOPHAGE) 500 MG tablet Take 1 tablet (500 mg total) by mouth 2 (two) times daily with a meal. 180 tablet 2  . OXYGEN Inhale 2 L into the lungs continuous.     . pantoprazole (PROTONIX) 40 MG tablet Take 1 tablet (40 mg total) by mouth 2 (two) times daily before a meal. 60 tablet 3  . SPIRIVA HANDIHALER 18 MCG inhalation capsule INHALE 1 CAPSULE VIA HANDIHALER ONCE DAILY AT THE SAME TIME EVERY DAY (Patient taking differently: Place 18 mcg into inhaler and inhale daily. ) 30 capsule 0  . ursodiol (ACTIGALL) 500 MG tablet Take 1 tablet (500 mg total) by mouth 3 (three) times daily. 270 tablet 3  . benzonatate (TESSALON) 100 MG capsule Take 1 capsule (100 mg total) by mouth every 8 (eight) hours. (Patient not taking:  Reported on 05/22/2019) 21 capsule 0  . blood glucose meter kit and supplies Dispense based on patient and insurance preference. Use up to four times daily as directed. (FOR ICD-10 E10.9, E11.9). 1 each 0  . dextromethorphan 15 MG/5ML syrup Take 10 mLs (30 mg total) by mouth 4 (four) times daily as needed for cough. (Patient not taking: Reported on 05/22/2019) 120 mL 0  . glucose blood (TRUE METRIX BLOOD GLUCOSE TEST) test strip Use as instructed 100 each 12  . ondansetron (ZOFRAN ODT) 4 MG disintegrating tablet Take 1-2 tablets (4-8 mg total) by mouth every 8 (eight) hours as needed for nausea or vomiting. (Patient not taking: Reported on 05/22/2019) 18 tablet 0  . predniSONE (DELTASONE) 10 MG tablet Take 20 mg by mouth 2 (two) times daily with a meal.     . tamsulosin (FLOMAX) 0.4 MG CAPS capsule Take 1 capsule (0.4 mg total) by mouth daily. (Patient not taking: Reported on 05/22/2019) 30 capsule 3  . TRUEplus Lancets 28G MISC Use to check blood sugar as directed. 100 each 1    Objective: BP 123/77    Pulse 93   Temp 98.8 F (37.1 C) (Oral)   Resp 16   LMP  (LMP Unknown)   SpO2 95%   Exam: General: Mildly ill appearing female in NAD sitting up in bed, frequent cough Eyes: no scleral icterus, EOMI bilaterally  Neck: no LAD appreciated  Cardiovascular: hyperdynamic heart sounds, radial pulses palpated  Respiratory: Mild increased work of breathing with tachypnea. Significant bibasilar crackles, frequent cough limiting patient's ability to communicate in full sentences, [place] 2 liters in place with oxygen sats ranging from 94-97%  Gastrointestinal: soft, NT, minimal bowel sounds  MSK: moves all extremities with normal ROM  Derm: scattered telangiectasias on posterior aspect of neck  Neuro:alert and oriented x3  Labs and Imaging: CBC BMET  Recent Labs  Lab 05/22/19 1245  WBC 1.6*  HGB 10.1*  HCT 32.3*  PLT PLATELET CLUMPS NOTED ON SMEAR, COUNT APPEARS DECREASED   Recent Labs  Lab 05/22/19 1245  NA 136  K 3.7  CL 108  CO2 22  BUN 9  CREATININE 0.66  GLUCOSE 297*  CALCIUM 8.7*     EKG: NSR without signs of ischemia    Stark Klein, MD 05/22/2019, 7:48 PM PGY-1, [place] Intern pager: [phone], text pages welcome  FPTS Upper-Level Resident Addendum   I have independently interviewed and examined the patient. I have discussed the above with the original author and agree with their documentation. My edits for correction/addition/clarification are in green. Please see also any attending notes.    Patriciaann Clan, DO  Family Medicine PGY-2

## 2019-05-22 NOTE — Progress Notes (Signed)
 Virtual Visit via Telephone Note  I connected with Holly Mueller on 05/22/19 at  9:00 AM EST by telephone and verified that I am speaking with the correct person using two identifiers.  Location: Patient: Home Provider: [facility]    I discussed the limitations, risks, security and privacy concerns of performing an evaluation and management service by telephone and the availability of in person appointments. I also discussed with the patient that there may be a patient responsible charge related to this service. The patient expressed understanding and agreed to proceed.   History of Present Illness: 54 year old female, never smoked. PMH significant for ILD, restrictive lung disease, chronic respiratory failure, GERD, biliary cholangitis, hepatic cirrhosis, pancytopenia, type 2 diabetes, anemia, gastric varices, CKD, hx seizures. Patient of Dr. Geronimo, last seen on 05/09/19. Maintained on Symbicort , prn albuterol  and 2L oxygen.   05/22/2019 Patient contacted today for acute sick visit No showed, contacted three times unable to reach     Observations/Objective:   Assessment and Plan:   Follow Up Instructions:    I discussed the assessment and treatment plan with the patient. The patient was provided an opportunity to ask questions and all were answered. The patient agreed with the plan and demonstrated an understanding of the instructions.   The patient was advised to call back or seek an in-person evaluation if the symptoms worsen or if the condition fails to improve as anticipated.    Holly LELON Ferrari, NP

## 2019-05-22 NOTE — Telephone Encounter (Signed)
Spoke with the pt  She is c/o SOB, cough, wheezing and body aches x 1 wk  She states that she was seen in ED a wk ago and tested neg for covid but told she had a virus and she is not improving  Televisit with Beth at 9 am today

## 2019-05-23 ENCOUNTER — Inpatient Hospital Stay (HOSPITAL_COMMUNITY): Payer: Medicaid Other

## 2019-05-23 ENCOUNTER — Other Ambulatory Visit: Payer: Self-pay

## 2019-05-23 ENCOUNTER — Telehealth: Payer: Self-pay | Admitting: Internal Medicine

## 2019-05-23 ENCOUNTER — Encounter (HOSPITAL_COMMUNITY): Payer: Self-pay | Admitting: Family Medicine

## 2019-05-23 ENCOUNTER — Observation Stay (HOSPITAL_COMMUNITY): Payer: Medicaid Other

## 2019-05-23 DIAGNOSIS — J9611 Chronic respiratory failure with hypoxia: Secondary | ICD-10-CM | POA: Diagnosis not present

## 2019-05-23 DIAGNOSIS — J189 Pneumonia, unspecified organism: Secondary | ICD-10-CM | POA: Diagnosis not present

## 2019-05-23 DIAGNOSIS — J849 Interstitial pulmonary disease, unspecified: Secondary | ICD-10-CM | POA: Diagnosis not present

## 2019-05-23 DIAGNOSIS — J9621 Acute and chronic respiratory failure with hypoxia: Secondary | ICD-10-CM | POA: Diagnosis present

## 2019-05-23 DIAGNOSIS — R0602 Shortness of breath: Secondary | ICD-10-CM | POA: Diagnosis not present

## 2019-05-23 DIAGNOSIS — Z20822 Contact with and (suspected) exposure to covid-19: Secondary | ICD-10-CM | POA: Diagnosis present

## 2019-05-23 DIAGNOSIS — Z515 Encounter for palliative care: Secondary | ICD-10-CM | POA: Diagnosis not present

## 2019-05-23 DIAGNOSIS — Z6839 Body mass index (BMI) 39.0-39.9, adult: Secondary | ICD-10-CM | POA: Diagnosis not present

## 2019-05-23 DIAGNOSIS — D61818 Other pancytopenia: Secondary | ICD-10-CM | POA: Diagnosis present

## 2019-05-23 DIAGNOSIS — I517 Cardiomegaly: Secondary | ICD-10-CM | POA: Diagnosis present

## 2019-05-23 DIAGNOSIS — Z9981 Dependence on supplemental oxygen: Secondary | ICD-10-CM | POA: Diagnosis not present

## 2019-05-23 DIAGNOSIS — K743 Primary biliary cirrhosis: Secondary | ICD-10-CM | POA: Diagnosis present

## 2019-05-23 DIAGNOSIS — J8489 Other specified interstitial pulmonary diseases: Secondary | ICD-10-CM | POA: Diagnosis present

## 2019-05-23 DIAGNOSIS — R911 Solitary pulmonary nodule: Secondary | ICD-10-CM | POA: Diagnosis not present

## 2019-05-23 DIAGNOSIS — E119 Type 2 diabetes mellitus without complications: Secondary | ICD-10-CM | POA: Diagnosis not present

## 2019-05-23 DIAGNOSIS — Z801 Family history of malignant neoplasm of trachea, bronchus and lung: Secondary | ICD-10-CM | POA: Diagnosis not present

## 2019-05-23 DIAGNOSIS — R05 Cough: Secondary | ICD-10-CM

## 2019-05-23 DIAGNOSIS — Z823 Family history of stroke: Secondary | ICD-10-CM | POA: Diagnosis not present

## 2019-05-23 DIAGNOSIS — R0603 Acute respiratory distress: Secondary | ICD-10-CM | POA: Diagnosis not present

## 2019-05-23 DIAGNOSIS — Z7951 Long term (current) use of inhaled steroids: Secondary | ICD-10-CM | POA: Diagnosis not present

## 2019-05-23 DIAGNOSIS — Z23 Encounter for immunization: Secondary | ICD-10-CM | POA: Diagnosis not present

## 2019-05-23 DIAGNOSIS — J84112 Idiopathic pulmonary fibrosis: Secondary | ICD-10-CM | POA: Diagnosis not present

## 2019-05-23 DIAGNOSIS — Z8249 Family history of ischemic heart disease and other diseases of the circulatory system: Secondary | ICD-10-CM | POA: Diagnosis not present

## 2019-05-23 DIAGNOSIS — Z90711 Acquired absence of uterus with remaining cervical stump: Secondary | ICD-10-CM | POA: Diagnosis not present

## 2019-05-23 DIAGNOSIS — Z8379 Family history of other diseases of the digestive system: Secondary | ICD-10-CM | POA: Diagnosis not present

## 2019-05-23 DIAGNOSIS — E669 Obesity, unspecified: Secondary | ICD-10-CM | POA: Diagnosis present

## 2019-05-23 DIAGNOSIS — E039 Hypothyroidism, unspecified: Secondary | ICD-10-CM | POA: Diagnosis present

## 2019-05-23 DIAGNOSIS — E1165 Type 2 diabetes mellitus with hyperglycemia: Secondary | ICD-10-CM | POA: Diagnosis present

## 2019-05-23 DIAGNOSIS — K219 Gastro-esophageal reflux disease without esophagitis: Secondary | ICD-10-CM | POA: Diagnosis present

## 2019-05-23 DIAGNOSIS — Z833 Family history of diabetes mellitus: Secondary | ICD-10-CM | POA: Diagnosis not present

## 2019-05-23 DIAGNOSIS — I454 Nonspecific intraventricular block: Secondary | ICD-10-CM | POA: Diagnosis present

## 2019-05-23 DIAGNOSIS — J302 Other seasonal allergic rhinitis: Secondary | ICD-10-CM | POA: Diagnosis present

## 2019-05-23 LAB — CBC WITH DIFFERENTIAL/PLATELET
Abs Immature Granulocytes: 0.02 10*3/uL (ref 0.00–0.07)
Basophils Absolute: 0 10*3/uL (ref 0.0–0.1)
Basophils Relative: 1 %
Eosinophils Absolute: 0 10*3/uL (ref 0.0–0.5)
Eosinophils Relative: 1 %
HCT: 31.9 % — ABNORMAL LOW (ref 36.0–46.0)
Hemoglobin: 9.8 g/dL — ABNORMAL LOW (ref 12.0–15.0)
Immature Granulocytes: 2 %
Lymphocytes Relative: 16 %
Lymphs Abs: 0.2 10*3/uL — ABNORMAL LOW (ref 0.7–4.0)
MCH: 27.2 pg (ref 26.0–34.0)
MCHC: 30.7 g/dL (ref 30.0–36.0)
MCV: 88.6 fL (ref 80.0–100.0)
Monocytes Absolute: 0.1 10*3/uL (ref 0.1–1.0)
Monocytes Relative: 6 %
Neutro Abs: 0.9 10*3/uL — ABNORMAL LOW (ref 1.7–7.7)
Neutrophils Relative %: 74 %
Platelets: 60 10*3/uL — ABNORMAL LOW (ref 150–400)
RBC: 3.6 MIL/uL — ABNORMAL LOW (ref 3.87–5.11)
RDW: 14.1 % (ref 11.5–15.5)
WBC: 1.1 10*3/uL — CL (ref 4.0–10.5)
nRBC: 0 % (ref 0.0–0.2)

## 2019-05-23 LAB — COMPREHENSIVE METABOLIC PANEL
ALT: 35 U/L (ref 0–44)
AST: 47 U/L — ABNORMAL HIGH (ref 15–41)
Albumin: 2.5 g/dL — ABNORMAL LOW (ref 3.5–5.0)
Alkaline Phosphatase: 136 U/L — ABNORMAL HIGH (ref 38–126)
Anion gap: 7 (ref 5–15)
BUN: 12 mg/dL (ref 6–20)
CO2: 19 mmol/L — ABNORMAL LOW (ref 22–32)
Calcium: 8.3 mg/dL — ABNORMAL LOW (ref 8.9–10.3)
Chloride: 105 mmol/L (ref 98–111)
Creatinine, Ser: 0.85 mg/dL (ref 0.44–1.00)
GFR calc Af Amer: >60 mL/min (ref >60)
GFR calc non Af Amer: >60 mL/min (ref >60)
Glucose, Bld: 508 mg/dL (ref 70–99)
Potassium: 4.2 mmol/L (ref 3.5–5.1)
Sodium: 131 mmol/L — ABNORMAL LOW (ref 135–145)
Total Bilirubin: 2.5 mg/dL — ABNORMAL HIGH (ref 0.3–1.2)
Total Protein: 7 g/dL (ref 6.5–8.1)

## 2019-05-23 LAB — HEMOGLOBIN A1C
Hgb A1c MFr Bld: 5.5 % (ref 4.8–5.6)
Mean Plasma Glucose: 111.15 mg/dL

## 2019-05-23 LAB — PATHOLOGIST SMEAR REVIEW

## 2019-05-23 LAB — GLUCOSE, CAPILLARY
Glucose-Capillary: 366 mg/dL — ABNORMAL HIGH (ref 70–99)
Glucose-Capillary: 258 mg/dL — ABNORMAL HIGH (ref 70–99)
Glucose-Capillary: 217 mg/dL — ABNORMAL HIGH (ref 70–99)
Glucose-Capillary: 219 mg/dL — ABNORMAL HIGH (ref 70–99)

## 2019-05-23 LAB — CBG MONITORING, ED: Glucose-Capillary: 468 mg/dL — ABNORMAL HIGH (ref 70–99)

## 2019-05-23 LAB — SARS CORONAVIRUS 2 (TAT 6-24 HRS): SARS Coronavirus 2: NEGATIVE

## 2019-05-23 MED ORDER — IPRATROPIUM-ALBUTEROL 0.5-2.5 (3) MG/3ML IN SOLN
3.0000 mL | Freq: Three times a day (TID) | RESPIRATORY_TRACT | Status: DC
  Administered 2019-05-23 – 2019-05-24 (×2): 3 mL via RESPIRATORY_TRACT
  Filled 2019-05-23 (×2): qty 3

## 2019-05-23 MED ORDER — HYDROCOD POLST-CPM POLST ER 10-8 MG/5ML PO SUER
5.0000 mL | Freq: Two times a day (BID) | ORAL | Status: DC
  Administered 2019-05-24 – 2019-05-27 (×7): 5 mL via ORAL
  Filled 2019-05-23 (×7): qty 5

## 2019-05-23 MED ORDER — HYDROCOD POLST-CPM POLST ER 10-8 MG/5ML PO SUER
5.0000 mL | Freq: Two times a day (BID) | ORAL | Status: DC | PRN
  Administered 2019-05-23: 5 mL via ORAL
  Filled 2019-05-23: qty 5

## 2019-05-23 MED ORDER — ALBUTEROL SULFATE (2.5 MG/3ML) 0.083% IN NEBU: 2.5000 mg | INHALATION_SOLUTION | RESPIRATORY_TRACT | Status: DC | PRN

## 2019-05-23 MED ORDER — INSULIN ASPART 100 UNIT/ML ~~LOC~~ SOLN
10.0000 [IU] | Freq: Once | SUBCUTANEOUS | Status: AC
  Administered 2019-05-23: 10 [IU] via SUBCUTANEOUS

## 2019-05-23 MED ORDER — INSULIN GLARGINE 100 UNIT/ML ~~LOC~~ SOLN
10.0000 [IU] | Freq: Every day | SUBCUTANEOUS | Status: DC
  Administered 2019-05-23 – 2019-05-27 (×5): 10 [IU] via SUBCUTANEOUS
  Filled 2019-05-23 (×5): qty 0.1

## 2019-05-23 MED ORDER — PNEUMOCOCCAL VAC POLYVALENT 25 MCG/0.5ML IJ INJ
0.5000 mL | INJECTION | INTRAMUSCULAR | Status: AC
  Administered 2019-05-25: 0.5 mL via INTRAMUSCULAR
  Filled 2019-05-23: qty 0.5

## 2019-05-23 MED ORDER — ACETAMINOPHEN 325 MG PO TABS
650.0000 mg | ORAL_TABLET | Freq: Four times a day (QID) | ORAL | Status: DC | PRN
  Administered 2019-05-23: 650 mg via ORAL
  Filled 2019-05-23: qty 2

## 2019-05-23 MED ORDER — INSULIN ASPART 100 UNIT/ML ~~LOC~~ SOLN
0.0000 [IU] | Freq: Three times a day (TID) | SUBCUTANEOUS | Status: DC
  Administered 2019-05-23: 5 [IU] via SUBCUTANEOUS
  Administered 2019-05-23: 8 [IU] via SUBCUTANEOUS
  Administered 2019-05-24 (×2): 3 [IU] via SUBCUTANEOUS
  Administered 2019-05-24: 5 [IU] via SUBCUTANEOUS
  Administered 2019-05-25: 2 [IU] via SUBCUTANEOUS
  Administered 2019-05-25: 3 [IU] via SUBCUTANEOUS
  Administered 2019-05-25: 5 [IU] via SUBCUTANEOUS
  Administered 2019-05-26 (×3): 2 [IU] via SUBCUTANEOUS
  Administered 2019-05-27: 3 [IU] via SUBCUTANEOUS
  Administered 2019-05-27: 2 [IU] via SUBCUTANEOUS

## 2019-05-23 NOTE — Plan of Care (Signed)

## 2019-05-23 NOTE — Discharge Summary (Addendum)
[facility] Discharge Summary  Patient name: Holly Mueller Medical record number: [id number] Date of birth: [birth year] Age: 54 y.o. Gender: female Date of Admission: 05/22/2019  Date of Discharge: 05/27/19 Admitting Physician: Leeanne Rio, MD  Primary Care Provider: Cleophas Dunker, DO Consultants: Pulmonology, Palliative Care  Indication for Hospitalization: acute on chronic hypoxic respiratory failure   Discharge Diagnoses/Problem List:  Active Problems:   Chronic cough   Community acquired pneumonia of left lower lobe of lung   Respiratory distress   On supplemental oxygen therapy   Nodule of right lung  Disposition: discharge home   Discharge Condition: stable, improved   Discharge Exam:   Completed by Dr. Grandville Silos 05/27/19  General: female sitting up in bed in NAD, with nasal canula in place  Cardiovascular: hyperdynamic heart sounds, no friction rub, bilateral radial pulses palpated Respiratory: patient continues to have crackles bilaterally, no wheezing, basilar crackles Abdomen: abdomen is soft without tenderness to palpation, bowel sounds appreciated  Extremities: trace LE edema  Brief Hospital Course:   Acute on Chronic Hypoxic Respiratory Failure Holly Mueller is a 54 y.o. female who presented with worsening cough and SOB. Patient was started on abx (azithromycin, CTX) x1 dose each on 1/11 and dexamethasone (for one day discontinued on 1/12) as there was concern for PNA and potentially COVID as patient had body aches in addition to above mentioned respiratory symptoms. Patient was negative for both covid and influenza. Pulmonology was consulted and followed patient.  PFTs were completed as well as double stranded DNA labs and complement levels. Patient was continued on home inhaler regimen. At time of discharge patient was stable on her home 2L of Oxygen with sats of 94%.   ILD, acute on chronic, with cough   Pulmonary medicine was consulted and recommended stopping abx and stopping steroids as a means to avoid hyperglycemia. Patient underwent PFT studies. Patient was treated with tessalon perles and tussionex in addition to continued supplemental oxygenation with 2L oxygen.    Palliative Care Consult  Given patient's worsening pulmonary function and cough, palliative care was consulted and held a meeting with patient and patient's sister. Recommendations included:   Cough/SOB Symptom Management:  -keep temp cool  -continue Tussionex and tessalon perles -can consider PRN morphine in future if these symptoms worsen  -resources given for financial concerns  -referral for outpatient palliative care follow up   [place]  -continued discussion as outpatient -Patient will remain full code  -HCPoA/Living will forms given to complete along with Chaplain consult   Chronic Pancytopenia: WBC count was monitored during this admission. Values were 1.6>1.1>1.9. Hemoglobin was stable ranging from 10 to 9.1. Plts were around 60. Patient recommended for follow up with heme onc as outpatient. Patient is established with outpatient heme-onc.   Primary Biliary Cholangitis/ Hepatic Cirrhosis  Patient was continued on home dose of actigall during admission. CMP showed mildly elevated AST of 47 and total bili of 2.5. Patient did not have RUQ/abdominal complaints throughout her stay.   T2DM Patient was on Metformin at home. She was treated with insulin while admitted and insulin was titrated as appropriate. Patient's hemoglobin A1c was 5.5 during this admission.   Hypothyroidism: Patient's home synthroid was continued at 23mg daily.   GERD: Patient was continued on Protonix 40.   Issues for Follow Up:  1. Patient should have follow up with pulmonology as outpatient. Please confirm that this has occurred or is scheduled.  2. Please complete CMP to assess  for hepatic function, patient has baseline hepatic  dysfunction 2/2 to biliary cholangitis on Actigall.  3. Patient referred for outpatient palliative care.  4. Patient will need follow up CT in July as recommended by pulmonology.  Significant Procedures:   PFTs   Significant Labs and Imaging:  Recent Labs  Lab 05/24/19 0308 05/25/19 0224 05/26/19 0403  WBC 1.9* 1.5* 1.5*  HGB 9.1* 9.6* 8.9*  HCT 28.1* 30.9* 27.8*  PLT 67* 63* 62*   Recent Labs  Lab 05/23/19 0437 05/23/19 0437 05/24/19 0308 05/25/19 0224 05/26/19 0403 05/27/19 0428  NA 131*  --  135  --  136  --   K 4.2   < > 3.5  --  3.9  --   CL 105  --  106  --  105  --   CO2 19*  --  21*  --  24  --   GLUCOSE 508*  --  201*  --  172*  --   BUN 12  --  14  --  11  --   CREATININE 0.85  --  0.70  --  0.64  --   CALCIUM 8.3*  --  8.2*  --  8.3*  --   MG  --   --   --  1.9 2.1 1.9  PHOS  --   --   --  3.4 4.0 4.0  ALKPHOS 136*  --  113  --   --   --   AST 47*  --  28  --   --   --   ALT 35  --  29  --   --   --   ALBUMIN 2.5*  --  2.4*  --   --   --    < > = values in this interval not displayed.    DG Chest 2 View  Result Date: 05/23/2019 CLINICAL DATA:  Pneumonia. Shortness of breath/midline chest congestion for 2 weeks, recent COVID test yesterday (negative). EXAM: CHEST - 2 VIEW COMPARISON:  Chest radiograph 05/22/2019 FINDINGS: Cardiomediastinal silhouette unchanged. Shallow inspiration radiograph. Probable mild incomplete atelectasis at the medial left lung base. No definite airspace consolidation. No evidence of pleural effusion or pneumothorax. No acute bony abnormality. Overlying cardiac monitoring leads. IMPRESSION: Shallow inspiration radiograph. Probable mild incomplete atelectasis at the medial left lung base. No definite airspace consolidation. Electronically Signed   By: Kellie Simmering DO   On: 05/23/2019 07:59   CT Chest High Resolution  Result Date: 05/24/2019 CLINICAL DATA:  54 year old female with history of acute respiratory failure with hypoxia.  Evaluate for interstitial lung disease. EXAM: CT CHEST WITHOUT CONTRAST TECHNIQUE: Multidetector CT imaging of the chest was performed following the standard protocol without intravenous contrast. High resolution imaging of the lungs, as well as inspiratory and expiratory imaging, was performed. COMPARISON:  Chest CT 12/09/2018. FINDINGS: Cardiovascular: Heart size is mildly enlarged. There is no significant pericardial fluid, thickening or pericardial calcification. Aortic atherosclerosis. No definite coronary artery calcifications. Mild calcifications of the aortic valve. Mediastinum/Nodes: No pathologically enlarged mediastinal or hilar lymph nodes. Please note that accurate exclusion of hilar adenopathy is limited on noncontrast CT scans. Esophagus is unremarkable in appearance. No axillary lymphadenopathy. Lungs/Pleura: In the periphery of the right upper lobe near the apex (axial image 24 of series 11) there is a new well-defined 2.7 x 1.4 cm area of ground-glass attenuation. High-resolution images otherwise demonstrate patchy areas of peripheral predominant ground-glass attenuation, septal thickening, regional architectural distortion, scattered regions  of mild cylindrical bronchiectasis and peripheral bronchiolectasis. No frank honeycombing. These findings appear similar to the prior study (although direct comparison is limited by extensive respiratory motion on the prior study) and have a mild craniocaudal gradient. Inspiratory and expiratory imaging is unremarkable. No pleural effusions. Upper Abdomen: Liver has a shrunken appearance and nodular contour, suggesting underlying cirrhosis. Mild diffuse low attenuation throughout the visualized hepatic parenchyma, indicative of hepatic steatosis. Spleen is incompletely imaged, but appears enlarged measuring at least 14.7 cm on axial images. Postoperative changes in the proximal stomach incompletely imaged. Musculoskeletal: There are no aggressive appearing  lytic or blastic lesions noted in the visualized portions of the skeleton. IMPRESSION: 1. Similar appearance of the lungs with a spectrum of findings considered probable usual interstitial pneumonia (UIP) per current ats guidelines. 2. New nodular area of ground-glass attenuation in the periphery of the right upper lobe. This could be of infectious or inflammatory etiology, or could represent an area of active inflammation and developing fibrosis. Attention on follow-up high-resolution chest CT is recommended in 6-12 months. 3. Aortic atherosclerosis. 4. There are calcifications of the aortic valve. Echocardiographic correlation for evaluation of potential valvular dysfunction may be warranted if clinically indicated. 5. Hepatic steatosis. Aortic Atherosclerosis (ICD10-I70.0). Electronically Signed   By: Vinnie Langton M.D.   On: 05/24/2019 08:16   DG Chest Port 1 View  Result Date: 05/22/2019 CLINICAL DATA:  Cough. EXAM: PORTABLE CHEST 1 VIEW COMPARISON:  May 15, 2019. FINDINGS: Stable cardiomediastinal silhouette. No pneumothorax is noted. Right lung is clear. Minimal left basilar subsegmental atelectasis or infiltrate is noted. No significant pleural effusion is noted. Bony thorax is unremarkable. IMPRESSION: Minimal left basilar subsegmental atelectasis or infiltrate is noted. No significant pleural effusion is noted. Electronically Signed   By: Marijo Conception M.D.   On: 05/22/2019 15:30    Results/Tests Pending at Time of Discharge:  None   Discharge Medications:  Allergies as of 05/27/2019      Reactions   Aspirin Nausea Only, Other (See Comments)   Upset stomach      Medication List    STOP taking these medications   dextromethorphan 15 MG/5ML syrup   dextromethorphan-guaiFENesin 30-600 MG 12hr tablet Commonly known as: MUCINEX DM   ondansetron 4 MG disintegrating tablet Commonly known as: Zofran ODT   predniSONE 10 MG tablet Commonly known as: DELTASONE   tamsulosin 0.4 MG  Caps capsule Commonly known as: FLOMAX   ursodiol 500 MG tablet Commonly known as: ACTIGALL Replaced by: ursodiol 300 MG capsule     TAKE these medications   albuterol 108 (90 Base) MCG/ACT inhaler Commonly known as: ProAir HFA INHALE 2 PUFFS INTO THE LUNGS EVERY 6 HOURS AS NEEDED FOR SHORTNESS OF BREATH What changed:   how much to take  how to take this  when to take this  reasons to take this  additional instructions   albuterol (2.5 MG/3ML) 0.083% nebulizer solution Commonly known as: PROVENTIL Take 3 mLs (2.5 mg total) by nebulization every 6 (six) hours as needed for wheezing or shortness of breath. What changed: Another medication with the same name was changed. Make sure you understand how and when to take each.   APAP 325 MG tablet Take 2 tablets (650 mg total) by mouth every 6 (six) hours as needed. What changed:   medication strength  how much to take  reasons to take this   benzonatate 100 MG capsule Commonly known as: TESSALON Take 1 capsule (100 mg total) by mouth  every 8 (eight) hours.   blood glucose meter kit and supplies Dispense based on patient and insurance preference. Use up to four times daily as directed. (FOR ICD-10 E10.9, E11.9).   budesonide-formoterol 80-4.5 MCG/ACT inhaler Commonly known as: Symbicort Inhale 2 puffs into the lungs 2 (two) times daily for 1 day.   cetirizine 10 MG tablet Commonly known as: ZYRTEC Take 1 tablet (10 mg total) by mouth daily.   chlorpheniramine-HYDROcodone 10-8 MG/5ML Suer Commonly known as: TUSSIONEX Take 5 mLs by mouth every 12 (twelve) hours.   diclofenac Sodium 1 % Gel Commonly known as: VOLTAREN Apply 4 g topically 4 (four) times daily as needed (pain).   fluticasone 50 MCG/ACT nasal spray Commonly known as: FLONASE Place 2 sprays into both nostrils daily.   glucose blood test strip Commonly known as: True Metrix Blood Glucose Test Use as instructed   levothyroxine 125 MCG  tablet Commonly known as: SYNTHROID TAKE 2 TABLETS (250 MCG TOTAL) BY MOUTH DAILY. What changed:   how much to take  when to take this   metFORMIN 500 MG tablet Commonly known as: GLUCOPHAGE Take 1 tablet (500 mg total) by mouth 2 (two) times daily with a meal.   OXYGEN Inhale 2 L into the lungs continuous.   pantoprazole 40 MG tablet Commonly known as: PROTONIX Take 1 tablet (40 mg total) by mouth 2 (two) times daily before a meal.   Spiriva HandiHaler 18 MCG inhalation capsule Generic drug: tiotropium INHALE 1 CAPSULE VIA HANDIHALER ONCE DAILY AT THE SAME TIME EVERY DAY What changed: See the new instructions.   TRUEplus Lancets 28G Misc Use to check blood sugar as directed.   ursodiol 300 MG capsule Commonly known as: ACTIGALL Take 2 capsules (600 mg total) by mouth 3 (three) times daily. Replaces: ursodiol 500 MG tablet       Discharge Instructions: Please refer to Patient Instructions section of EMR for full details.  Patient was counseled important signs and symptoms that should prompt return to medical care, changes in medications, dietary instructions, activity restrictions, and follow up appointments.   Follow-Up Appointments: Follow-up Information    Croitoru, Mihai, MD .   Specialty: Cardiology Contact information: [address] [place] [address] [phone]        Matilde Haymaker, MD. Go on 05/30/2019.   Specialty: Family Medicine Why: Apt at 3:10PM Contact information: [place] [address] [phone]        [facility] Follow up.   Specialty: Pulmonology Contact information: [address] [place] [address] [phone]          Stark Klein, MD 05/29/2019, 9:41 PM PGY-1, [place]

## 2019-05-23 NOTE — Evaluation (Signed)
Physical Therapy Evaluation Patient Details Name: Holly Mueller MRN: [id number] DOB: June 06, 1965 Today's Date: 05/23/2019   History of Present Illness  Patient is a 54 y/o female admitted due to SOB, cough, aches, negative for Covid.  PMH positive for primary biliary colangitis, hepatic cirrhosis, ILD, GERD, DM, hypothyroidism and pancytopenia.  Clinical Impression  Patient presents with mobility limited due to SOB and mild imbalance.  Feel she may benefit from skilled PT in the acute setting for continued education on energy conservation and balance training.  No further follow up PT recommended.  She does need a shower chair for home.     Follow Up Recommendations No PT follow up    Equipment Recommendations  Other (comment)(shower chair)    Recommendations for Other Services       Precautions / Restrictions Precautions Precautions: Fall Precaution Comments: on home O2      Mobility  Bed Mobility Overal bed mobility: Modified Independent                Transfers Overall transfer level: Modified independent                  Ambulation/Gait Ambulation/Gait assistance: Supervision Gait Distance (Feet): 120 Feet Assistive device: None Gait Pattern/deviations: Step-through pattern;Decreased stride length;Wide base of support     General Gait Details: slower pace with some hesitancy for balance, but stopping once due to SOB  Stairs            Wheelchair Mobility    Modified Rankin (Stroke Patients Only)       Balance Overall balance assessment: Needs assistance   Sitting balance-Leahy Scale: Normal       Standing balance-Leahy Scale: Good Standing balance comment: some instability present with calf soreness, no LOB                             Pertinent Vitals/Pain Pain Assessment: 0-10 Pain Score: 7  Pain Location: L calf Pain Descriptors / Indicators: Tightness;Sore Pain Intervention(s): Monitored during session     Home Living Family/patient expects to be discharged to:: Private residence Living Arrangements: Other relatives(sister) Available Help at Discharge: Family;Available 24 hours/day Type of Home: Mobile home Home Access: Stairs to enter Entrance Stairs-Rails: Right Entrance Stairs-Number of Steps: 6 Home Layout: One level Home Equipment: Walker - 2 wheels      Prior Function Level of Independence: Independent               Hand Dominance        Extremity/Trunk Assessment   Upper Extremity Assessment Upper Extremity Assessment: Overall WFL for tasks assessed    Lower Extremity Assessment Lower Extremity Assessment: Overall WFL for tasks assessed;LLE deficits/detail LLE Deficits / Details: reports pain in calf like a spasm that hasn't gone away    Cervical / Trunk Assessment Cervical / Trunk Assessment: Kyphotic  Communication   Communication: No difficulties  Cognition Arousal/Alertness: Awake/alert Behavior During Therapy: WFL for tasks assessed/performed Overall Cognitive Status: Within Functional Limits for tasks assessed                                        General Comments General comments (skin integrity, edema, etc.): SpO2 92% HR 103 after ambulating on 2L O2    Exercises Other Exercises Other Exercises: Educated on energy conservation strategies with seated for ADL/IADL's if  able, use of planning for conserving energy, etc.   Assessment/Plan    PT Assessment Patient needs continued PT services  PT Problem List Decreased activity tolerance;Decreased mobility;Cardiopulmonary status limiting activity;Decreased balance       PT Treatment Interventions Patient/family education;Functional mobility training;Gait training;Stair training;Therapeutic activities;Balance training    PT Goals (Current goals can be found in the Care Plan section)  Acute Rehab PT Goals Patient Stated Goal: to return to independent, breathe better PT Goal  Formulation: With patient Time For Goal Achievement: 05/30/19 Potential to Achieve Goals: Good    Frequency Min 3X/week   Barriers to discharge        Co-evaluation               AM-PAC PT "6 Clicks" Mobility  Outcome Measure Help needed turning from your back to your side while in a flat bed without using bedrails?: None Help needed moving from lying on your back to sitting on the side of a flat bed without using bedrails?: None Help needed moving to and from a bed to a chair (including a wheelchair)?: None Help needed standing up from a chair using your arms (e.g., wheelchair or bedside chair)?: None Help needed to walk in hospital room?: None Help needed climbing 3-5 steps with a railing? : A Little 6 Click Score: 23    End of Session Equipment Utilized During Treatment: Oxygen Activity Tolerance: Patient limited by fatigue Patient left: in bed;with call bell/phone within reach   PT Visit Diagnosis: Difficulty in walking, not elsewhere classified (R26.2)    Time: 4353-9122 PT Time Calculation (min) (ACUTE ONLY): 41 min   Charges:   PT Evaluation $PT Eval Moderate Complexity: [place], [place] [phone] 05/23/2019   Reginia Naas 05/23/2019, 11:03 AM

## 2019-05-23 NOTE — Plan of Care (Signed)
  Problem: Education: Goal: Knowledge of General Education information will improve Description: Including pain rating scale, medication(s)/side effects and non-pharmacologic comfort measures Outcome: Progressing

## 2019-05-23 NOTE — ED Notes (Signed)
Pt personal O2 tank with roll carrier was transported to 6N with pt.

## 2019-05-23 NOTE — Telephone Encounter (Signed)
Pt called and states MR visited her this morning at the hospital while admitted and discussed hospice and palliative care. Pt is requesting MR call her sister, Cecille Rubin, at [phone] and discuss this with her. Pt states she has a hard time remembering and is unable to relay accurate information to her sister which is why she wants MR to speak directly to the sister.   MR please advise. Cecille Rubin can be reached before 3pm at [phone]. Thanks.

## 2019-05-23 NOTE — ED Notes (Signed)
Pt given graham crackers and apple juice

## 2019-05-23 NOTE — ED Notes (Signed)
Spoke to Dr.Welborn with Family Medicine, verbal order obtained for 10units SQ insulin to be given now, then as scheduled with meals

## 2019-05-23 NOTE — ED Notes (Signed)
Breakfast Ordered 

## 2019-05-23 NOTE — ED Notes (Signed)
Attempted report

## 2019-05-23 NOTE — Progress Notes (Signed)
Pt arrived to room 6N26 via stretcher from the ED. Received report from [place], Therapist, sports. See assessment. Will continue to monitor.

## 2019-05-23 NOTE — Progress Notes (Signed)
Inpatient Diabetes Program Recommendations  AACE/ADA: New Consensus Statement on Inpatient Glycemic Control (2015)  Target Ranges:  Prepandial:   less than 140 mg/dL      Peak postprandial:   less than 180 mg/dL (1-2 hours)      Critically ill patients:  140 - 180 mg/dL   Lab Results  Component Value Date   GLUCAP 366 (H) 05/23/2019   HGBA1C 5.5 05/23/2019    Review of Glycemic Control Results for Holly, HOPPES "[name]" (MRN [id number]) as of 05/23/2019 09:37  Ref. Range 05/22/2019 21:58 05/23/2019 05:39 05/23/2019 08:17  Glucose-Capillary Latest Ref Range: 70 - 99 mg/dL 179 (H) 468 (H) 366 (H)   Diabetes history: Type 2 DM Outpatient Diabetes medications: Metformin 500 mg BID Current orders for Inpatient glycemic control: Novolog 0-9 units TID Decadron 6 mg QD  Inpatient Diabetes Program Recommendations:    Consider increasing correction to Novolog 0-20 units TID and adding Novolog 0-5 units QHS.   If post pranidals continue to exceed 180 mg/dL in the setting of steroids, consider Novolog 4 units TID (assuming patient is consuming >50% of meals).  Thanks, Bronson Curb, MSN, RNC-OB Diabetes Coordinator [phone] (8a-5p)

## 2019-05-23 NOTE — Progress Notes (Signed)
Family Medicine Teaching Service Daily Progress Note Intern Pager: [phone]  Patient name: Holly Mueller Medical record number: [id number] Date of birth: 1966-01-28 Age: 54 y.o. Gender: female  Primary Care Provider: Cleophas Dunker, DO Consultants: None  Code Status: Full Code   Pt Overview and Major Events to Date:  1/11: admitted with worsening SOB, Azithro & CTX, and Decadron started   Assessment and Plan: Holly Mueller is a 54 y.o. female presenting with worsening productive cough and SOB. PMH is significant for  primary biliary cholangitis,pancytopenia, ILD, hypothyroidism, GERD, T2DM and cirrhosis  Acute on Chronic Hypoxic Respiratory Failure concerning for CAP vs ILD exacerbation vs influenza  Patient states her cough feels about the same.  Overnight, patient was stable on 2 L via [place] with sats 95%.  On exam this am she continues to frequently cough during exam, aeration appears to have improved with decreased but still present crackles.  WBC still low, now 1.1 from 1.6 Confirmatory COVID testing was negative. Pending results of influenza testing.  -Day 2 of 10 on Decadron  -continue droplet and contact precaution  -f/u Influenza PCR  -continue Dulera -f/u blood cultures  -continue Ipratropium, albuterol, tessalon perles  -vitals per floor  -Pt/OT recommendations to follow  -Tylenol PRN for pain   ILD: Acute on Chronic -Currently receiving Decadron 6 mg daily for respiratory status above, consider transition to Prednisone PO -Inhaler regimen as above, holding home Spiriva while on short acting ipratropium -Encourage close follow-up with pulmonary, already scheduled to do spirometry/DLCO in the next several weeks -consult pulmonary for recommendations given pulm history  Pancytopenia: Chronic, stable. WBC 1.6>1.1, hemoglobin 10.1>9.8, platelets clumped on CBC.  This appears to be around her baseline.  Follows with hematology/oncology, had a bone marrow  biopsy in 11/2016 that was nonspecific.  Felt to be secondary to her liver cirrhosis. -f/u AM CBC with differential -Recommend follow-up with heme/oncology upon discharge   Chest pain: Acute, stable. Suspect musculoskeletal in nature due to sharp quality and related with frequent coughing over the past week.  Doubt ACS with reassuringly high-sensitivity troponin negative X2 with no suspicious EKG findings.  Will provide topical pain relief and cough suppression therapy to mitigate frequent coughing.  Primary Biliary Cholangitis  Hepatic Cirrhosis   Patient on home actigall. CMP with mildly elevated AST of 47 and Total bilirubin of 2.5.  -Actigall 500 TID   T2DM: Patient home medications include Metformin.  BG overnight 139-508, given 10 units of insulin.  Most recent A1c 5.5, likely falsely decreased in the setting of hematological abnormalities.   -CBG four times daily  -Sensitive SSI, advance sliding scale to moderate  -considering adding basal insulin at 10 units Lantus   Hyponatremia:  Na 131 on CMP, corrects to 138 in setting of hyperglycemia. Will manage hyperglycemia as detailed above.   Hypothyroidism Continue patient's home Synthroid 250 mcg daily  GERD: Continue Protonix 40  FEN/GI: carb modified  PPx: Lovenox   Disposition: discharge pending continued medical evaluation   Subjective:  Patient reports that her breathing has minimally improved, cough has remained persistent. Patient remains on 2 liters [place].  Objective: Temp:  [98.2 F (36.8 C)-98.8 F (37.1 C)] 98.2 F (36.8 C) (01/12 0838) Pulse Rate:  [88-102] 92 (01/12 0838) Resp:  [16-29] 20 (01/12 0838) BP: (103-143)/(60-92) 120/72 (01/12 0838) SpO2:  [95 %-98 %] 95 % (01/12 1540)  Physical Exam: General: overweight female sitting up in bed in NAD with [place] in place  Cardiovascular: hyperdynamic  heart sounds, no friction rub Respiratory: improved crackles bilaterally and diffusely, no wheezing, airways  sound restricted  Abdomen: soft, NT, bowel sounds present  Extremities: trace bilateral LE edema present   Laboratory: Recent Labs  Lab 05/22/19 1245 05/23/19 0437  WBC 1.6* 1.1*  HGB 10.1* 9.8*  HCT 32.3* 31.9*  PLT PLATELET CLUMPS NOTED ON SMEAR, COUNT APPEARS DECREASED 60*   Recent Labs  Lab 05/22/19 1245 05/23/19 0437  NA 136 131*  K 3.7 4.2  CL 108 105  CO2 22 19*  BUN 9 12  CREATININE 0.66 0.85  CALCIUM 8.7* 8.3*  PROT  --  7.0  BILITOT  --  2.5*  ALKPHOS  --  136*  ALT  --  35  AST  --  47*  GLUCOSE 297* 508*    Imaging/Diagnostic Tests: DG Chest 2 View  Result Date: 05/23/2019 CLINICAL DATA:  Pneumonia. Shortness of breath/midline chest congestion for 2 weeks, recent COVID test yesterday (negative). EXAM: CHEST - 2 VIEW COMPARISON:  Chest radiograph 05/22/2019 FINDINGS: Cardiomediastinal silhouette unchanged. Shallow inspiration radiograph. Probable mild incomplete atelectasis at the medial left lung base. No definite airspace consolidation. No evidence of pleural effusion or pneumothorax. No acute bony abnormality. Overlying cardiac monitoring leads. IMPRESSION: Shallow inspiration radiograph. Probable mild incomplete atelectasis at the medial left lung base. No definite airspace consolidation. Electronically Signed   By: Kellie Simmering DO   On: 05/23/2019 07:59   DG Chest Port 1 View  Result Date: 05/22/2019 CLINICAL DATA:  Cough. EXAM: PORTABLE CHEST 1 VIEW COMPARISON:  May 15, 2019. FINDINGS: Stable cardiomediastinal silhouette. No pneumothorax is noted. Right lung is clear. Minimal left basilar subsegmental atelectasis or infiltrate is noted. No significant pleural effusion is noted. Bony thorax is unremarkable. IMPRESSION: Minimal left basilar subsegmental atelectasis or infiltrate is noted. No significant pleural effusion is noted. Electronically Signed   By: Marijo Conception M.D.   On: 05/22/2019 15:30     Stark Klein, MD 05/23/2019, 11:44 AM PGY-1,  [place] Intern pager: [phone], text pages welcome

## 2019-05-23 NOTE — Consult Note (Signed)
NAME:  Holly Mueller, MRN:  [id number], DOB:  [birth year], LOS: 0 ADMISSION DATE:  05/22/2019, CONSULTATION DATE:  1/12 REFERRING MD:  Dr. Chase Caller, CHIEF COMPLAINT:  ILD   Brief History   54 year old female with history of ILD followed by Dr. Chase Caller. Admitted 1/11 for complaints of cough/congestion and body aches and there was concern for COVID-19 infection. Tests were negative, but symptoms persisted. PCCM consulted.   History of present illness   54 year old female with PMH as below, which is significant for CKD, DM, ILD on home O2, gastric varices, cirrhosis in the setting of biliary cholangitis, hypothyroidism, and seizures. She is followed by Dr. 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 She is on 2L O2 chronically. He considered starting chronic immunosuppression, but was concerned due to pancytopenia, and anti-fibrotic's are contraindicated in the setting of her hepatic disease. She was awaiting workup with GI to approve her for these meds. Hospice has been discussed, but she does not feel her disease has progressed far enough to warrant this.   She initially presented to [facility] ED on 1/4 with complaints of productive cough and progressive SOB x 1 week. Her COVID-19 test was negative and she was treated with inhaled bronchodilators and IV steroid. She improved and was discharged on prednisone taper. After returning home her symptoms worsened and she again presented to [facility] ED in the evening hours on 1/11 with these same complaints. Her COVID test was again negative. She was admitted to the [facility] for suspected pulmonary infection in the setting of underlying pulmonary disease. She was started on CAP coverage antibiotics, systemic steroids, and inhaled bronchodilators. 1/12 she didn't improve much, and with infectious source looking less likely, PCCM was consulted for ILD treatment.   Past Medical History   has a past medical history of Acute  respiratory failure with hypoxia ([place]) (09/22/2018), Anxiety, Arthritis, Chronic kidney disease, Community acquired pneumonia (11/28/2018), Diabetes mellitus without complication ([place]), Dysrhythmia, Edentulous, Epilepsy ([place]) (in 1972), Gastric varices with bleeding, GERD (gastroesophageal reflux disease) (2008), Head injury, Headache(784.0), Heart murmur, 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 ([place]), History of blood transfusion, History of kidney stones, Hypothyroidism (2008), Interstitial lung disease ([place]), Leukopenia, Lower esophageal ring (08/18/2013), Pneumonia, Primary biliary cholangitis ([place]) (10/24/2018), Seasonal allergies (2003), SEIZURES, HX OF (12/12/2009), Shortness of breath, and Wears glasses.   Significant Hospital Events   1/11 admit  Consults:  PCCM 1/12 >  Procedures:    Significant Diagnostic Tests:   Micro Data:  SARS-COV 2 1/11 swab  > neg Blood Cx 1/11 >  Antimicrobials:  CTX 1/11 > Azithromycin 1/11 >  Interim history/subjective:  Cough, dyspnea, orthopnea.   Objective   Blood pressure 120/72, pulse 92, temperature 98.2 F (36.8 C), temperature source Oral, resp. rate 20, SpO2 96 %.       No intake or output data in the 24 hours ending 05/23/19 1316 There were no vitals filed for this visit.  Examination: General: overweight middle aged female HENT: [place]/AT,PERRL, no JVD Lungs: coarse crackles entire posterior air fields  Cardiovascular: RRR, no MRG Abdomen: Soft, non-tender, non-distended Extremities: No acute deformity, no edema.  Neuro: Alert, oriented, non-focal.    Resolved Hospital Problem list     Assessment & Plan:   Chronic hypoxemic respiratory failure Usual interstitial pneumonia  She has had extensive workup with Dr. Chase Caller in the ILD clinic. This hospitalization seems to be a progressive of her  lung disease. She is a poor candidate for treatment options due to her co-morbidities including pancytopenia and hepatic  cirrhosis. She has not received symptomatic relief from steroids in the past. At this point symptom management is her best bet at ensuring quality of life.   Plan: Supplemental O2 2L Cough suppression with Tussionex Recommend home palliative/hospice, have discussed with patient and she is open to this.  Discontinue corticosteroids Low threshold to DC antibiotics.  Outpatient pulmonary follow up.    Labs   CBC: Recent Labs  Lab 05/22/19 1245 05/23/19 0437  WBC 1.6* 1.1*  NEUTROABS  --  0.9*  HGB 10.1* 9.8*  HCT 32.3* 31.9*  MCV 87.1 88.6  PLT PLATELET CLUMPS NOTED ON SMEAR, COUNT APPEARS DECREASED 60*    Basic Metabolic Panel: Recent Labs  Lab 05/22/19 1245 05/23/19 0437  NA 136 131*  K 3.7 4.2  CL 108 105  CO2 22 19*  GLUCOSE 297* 508*  BUN 9 12  CREATININE 0.66 0.85  CALCIUM 8.7* 8.3*   GFR: CrCl cannot be calculated (Unknown ideal weight.). Recent Labs  Lab 05/22/19 1245 05/23/19 0437  WBC 1.6* 1.1*    Liver Function Tests: Recent Labs  Lab 05/23/19 0437  AST 47*  ALT 35  ALKPHOS 136*  BILITOT 2.5*  PROT 7.0  ALBUMIN 2.5*   No results for input(s): LIPASE, AMYLASE in the last 168 hours. No results for input(s): AMMONIA in the last 168 hours.  ABG    Component Value Date/Time   TCO2 21 (L) 04/26/2019 0929     Coagulation Profile: No results for input(s): INR, PROTIME in the last 168 hours.  Cardiac Enzymes: No results for input(s): CKTOTAL, CKMB, CKMBINDEX, TROPONINI in the last 168 hours.  HbA1C: HbA1c, POC (controlled diabetic range)  Date/Time Value Ref Range Status  07/26/2018 01:59 PM 6.2 0.0 - 7.0 % Final  03/03/2018 11:22 AM 5.4 0.0 - 7.0 % Final   Hgb A1c MFr Bld  Date/Time Value Ref Range Status  05/23/2019 04:37 AM 5.5 4.8 - 5.6 % Final    Comment:    (NOTE) Pre diabetes:          5.7%-6.4% Diabetes:              >6.4% Glycemic control for   <7.0% adults with diabetes   12/09/2018 12:30 PM 4.8 4.8 - 5.6 % Final     Comment:    (NOTE) Pre diabetes:          5.7%-6.4% Diabetes:              >6.4% Glycemic control for   <7.0% adults with diabetes     CBG: Recent Labs  Lab 05/22/19 2101 05/22/19 2158 05/23/19 0539 05/23/19 0817 05/23/19 1220  GLUCAP 139* 179* 468* 366* 258*    Review of Systems:   Bolds are positive  Constitutional: weight loss, gain, night sweats, Fevers, chills, fatigue .  HEENT: headaches, Sore throat, sneezing, nasal congestion, post nasal drip, Difficulty swallowing, Tooth/dental problems, visual complaints visual changes, ear ache CV:  chest pain, radiates:,Orthopnea, PND, swelling in lower extremities, dizziness, palpitations, syncope.  GI  heartburn, indigestion, abdominal pain, nausea, vomiting, diarrhea, change in bowel habits, loss of appetite, bloody stools.  Resp: cough, productive:, hemoptysis, dyspnea, chest pain, pleuritic.  Skin: rash or itching or icterus GU: dysuria, change in color of urine, urgency or frequency. flank pain, hematuria  MS: joint pain or swelling. decreased range of motion  Psych: change in mood or affect.  depression or anxiety.  Neuro: difficulty with speech, weakness, numbness, ataxia    Past Medical History  She,  has a past medical history of Acute respiratory failure with hypoxia ([place]) (09/22/2018), Anxiety, Arthritis, Chronic kidney disease, Community acquired pneumonia (11/28/2018), Diabetes mellitus without complication ([place]), Dysrhythmia, Edentulous, Epilepsy ([place]) (in 1972), Gastric varices with bleeding, GERD (gastroesophageal reflux disease) (2008), Head injury, Headache(784.0), Heart murmur, 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 ([place]), History of blood transfusion, History of kidney stones, Hypothyroidism (2008), Interstitial lung disease ([place]), Leukopenia, Lower esophageal ring (08/18/2013), Pneumonia, Primary biliary cholangitis ([place]) (10/24/2018), Seasonal allergies (2003), SEIZURES, HX OF (12/12/2009), Shortness of  breath, and Wears glasses.   Surgical History    Past Surgical History:  Procedure Laterality Date  . ABDOMINAL HYSTERECTOMY     " partial "  . BALLOON DILATION N/A 10/24/2013   Procedure: BALLOON DILATION;  Surgeon: Gatha Mayer, MD;  Location: [facility];  Service: Endoscopy;  Laterality: N/A;  . BIOPSY  09/26/2018   Procedure: BIOPSY;  Surgeon: Thornton Park, MD;  Location: [place];  Service: Gastroenterology;;  . BIOPSY  01/09/2019   Procedure: BIOPSY;  Surgeon: Irving Copas., MD;  Location: [place];  Service: Gastroenterology;;  . CESAREAN SECTION     x2  . DENTAL SURGERY     multiple extractions 3'15  . DILATION AND CURETTAGE OF UTERUS    . ESOPHAGOGASTRODUODENOSCOPY N/A 10/24/2013   Procedure: ESOPHAGOGASTRODUODENOSCOPY (EGD);  Surgeon: Gatha Mayer, MD;  Location: [facility];  Service: Endoscopy;  Laterality: N/A;  . ESOPHAGOGASTRODUODENOSCOPY N/A 01/09/2019   Procedure: ESOPHAGOGASTRODUODENOSCOPY (EGD);  Surgeon: Irving Copas., MD;  Location: [place];  Service: Gastroenterology;  Laterality: N/A;  . ESOPHAGOGASTRODUODENOSCOPY (EGD) WITH PROPOFOL N/A 09/26/2018   Procedure: ESOPHAGOGASTRODUODENOSCOPY (EGD) WITH PROPOFOL;  Surgeon: Thornton Park, MD;  Location: [place];  Service: Gastroenterology;  Laterality: N/A;  . ESOPHAGOGASTRODUODENOSCOPY (EGD) WITH PROPOFOL N/A 04/26/2019   Procedure: ESOPHAGOGASTRODUODENOSCOPY (EGD) WITH PROPOFOL;  Surgeon: Rush Landmark Telford Nab., MD;  Location: [place];  Service: Gastroenterology;  Laterality: N/A;  . HEMOSTASIS CLIP PLACEMENT  04/26/2019   Procedure: HEMOSTASIS CLIP PLACEMENT;  Surgeon: Irving Copas., MD;  Location: [place];  Service: Gastroenterology;;  . IR PARACENTESIS  09/23/2018  . LIVER BIOPSY  06/30/2012   Procedure: LIVER BIOPSY;  Surgeon: Shann Medal, MD;  [facility];  Service: General;;  . NOVASURE ABLATION    . SCLEROTHERAPY  04/26/2019    Procedure: Clide Deutscher;  Surgeon: Mansouraty, Telford Nab., MD;  Location: [place];  Service: Gastroenterology;;  cyanoacrylate  . SHOULDER ARTHROSCOPY Right 2011  . UMBILICAL HERNIA REPAIR N/A 06/30/2012   Procedure: remove umbilicus;  Surgeon: Shann Medal, MD;  [facility];  Service: General;  Laterality: N/A;  . UPPER ESOPHAGEAL ENDOSCOPIC ULTRASOUND (EUS) N/A 01/09/2019   Procedure: UPPER ESOPHAGEAL ENDOSCOPIC ULTRASOUND (EUS);  Surgeon: Irving Copas., MD;  Location: [place];  Service: Gastroenterology;  Laterality: N/A;  . VENTRAL HERNIA REPAIR N/A 06/30/2012   Procedure: LAPAROSCOPIC VENTRAL HERNIA;  Surgeon: Shann Medal, MD;  [facility];  Service: General;  Laterality: N/A;  With Mesh  . VIDEO BRONCHOSCOPY Bilateral 07/20/2018   Procedure: VIDEO BRONCHOSCOPY WITHOUT FLUORO;  Surgeon: Brand Males, MD;  [facility];  Service: Cardiopulmonary;  Laterality: Bilateral;  . WISDOM TOOTH EXTRACTION       Social History   reports that she has never smoked. She has never used smokeless tobacco. She reports previous alcohol use. She reports that  she does not use drugs.   Family History   Her family history includes Cirrhosis in her mother; Diabetes in her mother and sister; Hypertension in her mother and sister; Lung cancer in her father; Stroke in her father. There is no history of Colon cancer, Esophageal cancer, or Rectal cancer.   Allergies Allergies  Allergen Reactions  . Aspirin Nausea Only and Other (See Comments)    Upset stomach     Home Medications  Prior to Admission medications   Medication Sig Start Date End Date Taking? Authorizing Provider  acetaminophen (TYLENOL) 500 MG tablet Take 1,000 mg by mouth every 6 (six) hours as needed for mild pain.   Yes [provider]  albuterol (PROAIR HFA) 108 (90 Base) MCG/ACT inhaler INHALE 2 PUFFS INTO THE LUNGS EVERY 6 HOURS AS NEEDED FOR SHORTNESS OF BREATH Patient taking  differently: Inhale 2 puffs into the lungs every 4 (four) hours as needed for wheezing or shortness of breath.  04/25/18  Yes Rory Percy, DO  albuterol (PROVENTIL) (2.5 MG/3ML) 0.083% nebulizer solution Take 3 mLs (2.5 mg total) by nebulization every 6 (six) hours as needed for wheezing or shortness of breath. 04/10/19  Yes Brand Males, MD  budesonide-formoterol (SYMBICORT) 80-4.5 MCG/ACT inhaler Inhale 2 puffs into the lungs 2 (two) times daily for 1 day. 01/11/19 03/08/20 Yes Lauraine Rinne, NP  cetirizine (ZYRTEC) 10 MG tablet Take 1 tablet (10 mg total) by mouth daily. 01/27/17  Yes Glenis Smoker, MD  dextromethorphan-guaiFENesin [facility] DM) 30-600 MG 12hr tablet Take 1 tablet by mouth 2 (two) times daily as needed for cough.   Yes [provider]  fluticasone (FLONASE) 50 MCG/ACT nasal spray Place 2 sprays into both nostrils daily.   Yes [provider]  levothyroxine (SYNTHROID) 125 MCG tablet TAKE 2 TABLETS (250 MCG TOTAL) BY MOUTH DAILY. Patient taking differently: Take 125 mcg by mouth 2 (two) times daily.  04/12/19  Yes Meccariello, Bernita Raisin, DO  metFORMIN (GLUCOPHAGE) 500 MG tablet Take 1 tablet (500 mg total) by mouth 2 (two) times daily with a meal. 12/06/18  Yes Meccariello, Bernita Raisin, DO  OXYGEN Inhale 2 L into the lungs continuous.    Yes [provider]  pantoprazole (PROTONIX) 40 MG tablet Take 1 tablet (40 mg total) by mouth 2 (two) times daily before a meal. 10/17/18  Yes Glenis Smoker, MD  SPIRIVA HANDIHALER 18 MCG inhalation capsule INHALE 1 CAPSULE VIA HANDIHALER ONCE DAILY AT [place] Patient taking differently: Place 18 mcg into inhaler and inhale daily.  01/09/19  Yes Meccariello, Bernita Raisin, DO  ursodiol (ACTIGALL) 500 MG tablet Take 1 tablet (500 mg total) by mouth 3 (three) times daily. 10/26/18  Yes Gatha Mayer, MD  benzonatate (TESSALON) 100 MG capsule Take 1 capsule (100 mg total) by mouth every 8 (eight)  hours. Patient not taking: Reported on 05/22/2019 05/15/19   Tedd Sias, PA  blood glucose meter kit and supplies Dispense based on patient and insurance preference. Use up to four times daily as directed. (FOR ICD-10 E10.9, E11.9). 09/02/18   Dessa Phi, DO  dextromethorphan 15 MG/5ML syrup Take 10 mLs (30 mg total) by mouth 4 (four) times daily as needed for cough. Patient not taking: Reported on 05/22/2019 05/15/19   Tedd Sias, PA  glucose blood (TRUE METRIX BLOOD GLUCOSE TEST) test strip Use as instructed 09/16/18   Glenis Smoker, MD  ondansetron (ZOFRAN ODT) 4 MG disintegrating tablet  Take 1-2 tablets (4-8 mg total) by mouth every 8 (eight) hours as needed for nausea or vomiting. Patient not taking: Reported on 05/22/2019 04/26/19   Mansouraty, Telford Nab., MD  predniSONE (DELTASONE) 10 MG tablet Take 20 mg by mouth 2 (two) times daily with a meal.     [provider]  tamsulosin (FLOMAX) 0.4 MG CAPS capsule Take 1 capsule (0.4 mg total) by mouth daily. Patient not taking: Reported on 05/22/2019 02/10/19   Benay Pike, MD  TRUEplus Lancets 28G MISC Use to check blood sugar as directed. 09/16/18   Glenis Smoker, MD      Georgann Housekeeper, AGACNP-BC [place]  See Amion for personal pager PCCM on call pager [phone]  05/23/2019 2:31 PM

## 2019-05-24 DIAGNOSIS — J9611 Chronic respiratory failure with hypoxia: Secondary | ICD-10-CM

## 2019-05-24 LAB — COMPREHENSIVE METABOLIC PANEL
ALT: 29 U/L (ref 0–44)
AST: 28 U/L (ref 15–41)
Albumin: 2.4 g/dL — ABNORMAL LOW (ref 3.5–5.0)
Alkaline Phosphatase: 113 U/L (ref 38–126)
Anion gap: 8 (ref 5–15)
BUN: 14 mg/dL (ref 6–20)
CO2: 21 mmol/L — ABNORMAL LOW (ref 22–32)
Calcium: 8.2 mg/dL — ABNORMAL LOW (ref 8.9–10.3)
Chloride: 106 mmol/L (ref 98–111)
Creatinine, Ser: 0.7 mg/dL (ref 0.44–1.00)
GFR calc Af Amer: >60 mL/min (ref >60)
GFR calc non Af Amer: >60 mL/min (ref >60)
Glucose, Bld: 201 mg/dL — ABNORMAL HIGH (ref 70–99)
Potassium: 3.5 mmol/L (ref 3.5–5.1)
Sodium: 135 mmol/L (ref 135–145)
Total Bilirubin: 1.4 mg/dL — ABNORMAL HIGH (ref 0.3–1.2)
Total Protein: 6.4 g/dL — ABNORMAL LOW (ref 6.5–8.1)

## 2019-05-24 LAB — RESPIRATORY PANEL BY PCR

## 2019-05-24 LAB — CBC WITH DIFFERENTIAL/PLATELET
Abs Immature Granulocytes: 0.02 10*3/uL (ref 0.00–0.07)
Basophils Absolute: 0 10*3/uL (ref 0.0–0.1)
Basophils Relative: 1 %
Eosinophils Absolute: 0.1 10*3/uL (ref 0.0–0.5)
Eosinophils Relative: 4 %
HCT: 28.1 % — ABNORMAL LOW (ref 36.0–46.0)
Hemoglobin: 9.1 g/dL — ABNORMAL LOW (ref 12.0–15.0)
Immature Granulocytes: 1 %
Lymphocytes Relative: 23 %
Lymphs Abs: 0.4 10*3/uL — ABNORMAL LOW (ref 0.7–4.0)
MCH: 27.2 pg (ref 26.0–34.0)
MCHC: 32.4 g/dL (ref 30.0–36.0)
MCV: 84.1 fL (ref 80.0–100.0)
Monocytes Absolute: 0.3 10*3/uL (ref 0.1–1.0)
Monocytes Relative: 14 %
Neutro Abs: 1.1 10*3/uL — ABNORMAL LOW (ref 1.7–7.7)
Neutrophils Relative %: 57 %
Platelets: 67 10*3/uL — ABNORMAL LOW (ref 150–400)
RBC: 3.34 MIL/uL — ABNORMAL LOW (ref 3.87–5.11)
RDW: 14.2 % (ref 11.5–15.5)
WBC: 1.9 10*3/uL — ABNORMAL LOW (ref 4.0–10.5)
nRBC: 0 % (ref 0.0–0.2)

## 2019-05-24 LAB — PROCALCITONIN: Procalcitonin: <0.1 ng/mL

## 2019-05-24 LAB — GLUCOSE, CAPILLARY
Glucose-Capillary: 188 mg/dL — ABNORMAL HIGH (ref 70–99)
Glucose-Capillary: 210 mg/dL — ABNORMAL HIGH (ref 70–99)
Glucose-Capillary: 185 mg/dL — ABNORMAL HIGH (ref 70–99)
Glucose-Capillary: 215 mg/dL — ABNORMAL HIGH (ref 70–99)

## 2019-05-24 LAB — INFLUENZA PANEL BY PCR (TYPE A & B)
Influenza A By PCR: NEGATIVE
Influenza B By PCR: NEGATIVE

## 2019-05-24 LAB — STREP PNEUMONIAE URINARY ANTIGEN: Strep Pneumo Urinary Antigen: NEGATIVE

## 2019-05-24 MED ORDER — ENOXAPARIN SODIUM 60 MG/0.6ML ~~LOC~~ SOLN
60.0000 mg | SUBCUTANEOUS | Status: DC
  Administered 2019-05-24 – 2019-05-26 (×3): 60 mg via SUBCUTANEOUS
  Filled 2019-05-24 (×3): qty 0.6

## 2019-05-24 MED ORDER — IPRATROPIUM-ALBUTEROL 0.5-2.5 (3) MG/3ML IN SOLN
3.0000 mL | Freq: Two times a day (BID) | RESPIRATORY_TRACT | Status: DC
  Administered 2019-05-24 – 2019-05-27 (×5): 3 mL via RESPIRATORY_TRACT
  Filled 2019-05-24 (×6): qty 3

## 2019-05-24 NOTE — Progress Notes (Signed)
Family Medicine Teaching Service Daily Progress Note Intern Pager: [phone]  Patient name: Holly Mueller Medical record number: [id number] Date of birth: Jul 24, 1965 Age: 54 y.o. Gender: female  Primary Care Provider: Cleophas Dunker, DO Consultants: None  Code Status: Full Code   Pt Overview and Major Events to Date:  1/11: admitted with worsening SOB, Azithro & CTX, and Decadron started   Assessment and Plan: [name] is a 54 y.o. female presenting with worsening productive cough and SOB. PMH is significant for  primary biliary cholangitis,pancytopenia, ILD, hypothyroidism, GERD, T2DM and cirrhosis  Acute on Chronic Hypoxic Respiratory Failure likely due to exacerbation of ILD   Patient states that her cough remains about the same but her SOB has improved some.  Overnight, patient was stable on 2 L via [place] with sats 95%.  On exam, patient continues to have bilateral crackles from mid to basilar lung fields.  WBC remain chronically low.  Negative results of influenza testing.   -continue Tussionex 2m BID for cough  -continue droplet and contact precaution   -continue Dulera -f/u blood cultures, NGTD -continue Ipratropium, albuterol, tessalon perles  -vitals per floor  -Pt/OT with no further recs -Tylenol PRN for pain   ILD: Acute on Chronic  -Inhaler regimen as above, holding home Spiriva while on short acting ipratropium -Encourage close follow-up with pulmonary, already scheduled to do spirometry/DLCO in the next several weeks -Pulm following with goals for palliative GOC discussion with family and tussionex no prednisone due to hyperglycemia   Pancytopenia: Chronic, stable. WBC 1.6>1.1>1.9, hemoglobin 10.1>9.8>9.1, PLT 67.  This appears to be around her baseline.  Follows with hematology/oncology, had a bone marrow biopsy in 11/2016 that was nonspecific.  Felt to be secondary to her liver cirrhosis. -AM CBC with differential -Recommend follow-up with  heme/oncology upon discharge   Chest pain: Acute, stable. Suspect musculoskeletal in nature due to sharp quality and related with frequent coughing over the past week.  Doubt ACS with reassuringly high-sensitivity troponin negative X2 with no suspicious EKG findings.  Will provide topical pain relief and cough suppression therapy to mitigate frequent coughing.  Primary Biliary Cholangitis  Hepatic Cirrhosis   Patient on home actigall. CMP with mildly elevated AST of 47 and Total bilirubin of 2.5.  -Actigall 500 TID   T2DM: Patient home medications include Metformin.  BG overnight 188-210, given 10 units of insulin.  Most recent A1c 5.5, likely falsely decreased in the setting of hematological abnormalities.   -CBG four times daily  -moderate SSI - basal insulin at 10 units Lantus   Hyponatremia:  Na 131 on CMP, corrects to 138 in setting of hyperglycemia. Will manage hyperglycemia as detailed above.   Hypothyroidism Continue patient's home Synthroid 250 mcg daily  GERD: Continue Protonix 40  FEN/GI: carb modified  PPx: Lovenox   Disposition: discharge pending continued medical evaluation   Subjective:  Patient reports that her breathing has minimally improved, cough has remained persistent. Patient remains on 2 liters [place].  Objective: Temp:  [98 F (36.7 C)-98.4 F (36.9 C)] 98 F (36.7 C) (01/13 0458) Pulse Rate:  [86-93] 86 (01/13 0458) Resp:  [18-20] 18 (01/13 0458) BP: (109-146)/(53-85) 109/71 (01/13 0458) SpO2:  [95 %-97 %] 96 % (01/13 01962  Physical Exam: General: overweight female sitting up in bed in NAD, no [place] on during this AM's exam   Cardiovascular: hyperdynamic heart sounds, no friction rub Respiratory: persistent crackles bilaterally, no wheezing, aeration improved from previous exam  Abdomen: abdomen is  soft without tenderness to palpation  Extremities: trace LE edema   Laboratory: Recent Labs  Lab 05/22/19 1245 05/23/19 0437 05/24/19 0308   WBC 1.6* 1.1* 1.9*  HGB 10.1* 9.8* 9.1*  HCT 32.3* 31.9* 28.1*  PLT PLATELET CLUMPS NOTED ON SMEAR, COUNT APPEARS DECREASED 60* 67*   Recent Labs  Lab 05/22/19 1245 05/23/19 0437 05/24/19 0308  NA 136 131* 135  K 3.7 4.2 3.5  CL 108 105 106  CO2 22 19* 21*  BUN _0 CREATININE 0.66 0.85 0.70  CALCIUM 8.7* 8.3* 8.2*  PROT  --  7.0 6.4*  BILITOT  --  2.5* 1.4*  ALKPHOS  --  136* 113  ALT  --  35 29  AST  --  47* 28  GLUCOSE 297* 508* 201*    Imaging/Diagnostic Tests: CT Chest High Resolution  Result Date: 05/24/2019 CLINICAL DATA:  54 year old female with history of acute respiratory failure with hypoxia. Evaluate for interstitial lung disease. EXAM: CT CHEST WITHOUT CONTRAST TECHNIQUE: Multidetector CT imaging of the chest was performed following the standard protocol without intravenous contrast. High resolution imaging of the lungs, as well as inspiratory and expiratory imaging, was performed. COMPARISON:  Chest CT 12/09/2018. FINDINGS: Cardiovascular: Heart size is mildly enlarged. There is no significant pericardial fluid, thickening or pericardial calcification. Aortic atherosclerosis. No definite coronary artery calcifications. Mild calcifications of the aortic valve. Mediastinum/Nodes: No pathologically enlarged mediastinal or hilar lymph nodes. Please note that accurate exclusion of hilar adenopathy is limited on noncontrast CT scans. Esophagus is unremarkable in appearance. No axillary lymphadenopathy. Lungs/Pleura: In the periphery of the right upper lobe near the apex (axial image 24 of series 11) there is a new well-defined 2.7 x 1.4 cm area of ground-glass attenuation. High-resolution images otherwise demonstrate patchy areas of peripheral predominant ground-glass attenuation, septal thickening, regional architectural distortion, scattered regions of mild cylindrical bronchiectasis and peripheral bronchiolectasis. No frank honeycombing. These findings appear similar to  the prior study (although direct comparison is limited by extensive respiratory motion on the prior study) and have a mild craniocaudal gradient. Inspiratory and expiratory imaging is unremarkable. No pleural effusions. Upper Abdomen: Liver has a shrunken appearance and nodular contour, suggesting underlying cirrhosis. Mild diffuse low attenuation throughout the visualized hepatic parenchyma, indicative of hepatic steatosis. Spleen is incompletely imaged, but appears enlarged measuring at least 14.7 cm on axial images. Postoperative changes in the proximal stomach incompletely imaged. Musculoskeletal: There are no aggressive appearing lytic or blastic lesions noted in the visualized portions of the skeleton. IMPRESSION: 1. Similar appearance of the lungs with a spectrum of findings considered probable usual interstitial pneumonia (UIP) per current ats guidelines. 2. New nodular area of ground-glass attenuation in the periphery of the right upper lobe. This could be of infectious or inflammatory etiology, or could represent an area of active inflammation and developing fibrosis. Attention on follow-up high-resolution chest CT is recommended in 6-12 months. 3. Aortic atherosclerosis. 4. There are calcifications of the aortic valve. Echocardiographic correlation for evaluation of potential valvular dysfunction may be warranted if clinically indicated. 5. Hepatic steatosis. Aortic Atherosclerosis (ICD10-I70.0). Electronically Signed   By: Vinnie Langton M.D.   On: 05/24/2019 08:16     Stark Klein, MD 05/24/2019, 1:16 PM PGY-1, [place] Intern pager: [phone], text pages welcome

## 2019-05-24 NOTE — Progress Notes (Signed)
Palliative-   Consult received- chart reviewed.   Plan made for meeting with patient and sister tomorrow at 3pm.   If patient is discharged before then- recommend outpatient Palliative followup.   Thank you for this consult.   Mariana Kaufman, AGNP-C Palliative Medicine  Please call Palliative Medicine team phone with any questions [phone]. For individual providers please see [facility].

## 2019-05-24 NOTE — Progress Notes (Signed)
Physical Therapy Treatment Patient Details Name: Holly Mueller MRN: [id number] DOB: 1965/05/20 Today's Date: 05/24/2019    History of Present Illness Patient is a 54 y/o female admitted due to SOB, cough, aches, negative for Covid.  PMH positive for primary biliary colangitis, hepatic cirrhosis, ILD, GERD, DM, hypothyroidism and pancytopenia.    PT Comments    Pt able to increase ambulation distance today.  Simulated stairs with hip flexion in room today.  If pt is up for trying them next visit, it would be good. She does have family with her on stairs with family carrying o2 tank for her. Encouraged her to place chair at bottom and top of stairs in case she needs them.  Pt has family support.   Follow Up Recommendations  No PT follow up     Equipment Recommendations  Other (comment)(shower chair)    Recommendations for Other Services       Precautions / Restrictions Precautions Precautions: Fall Precaution Comments: on home O2 Restrictions Weight Bearing Restrictions: No    Mobility  Bed Mobility Overal bed mobility: Modified Independent                Transfers Overall transfer level: Modified independent                  Ambulation/Gait Ambulation/Gait assistance: Supervision Gait Distance (Feet): 240 Feet Assistive device: None Gait Pattern/deviations: Step-through pattern;Decreased stride length;Wide base of support Gait velocity: decreased   General Gait Details: Pt able to ambulate without rest break on 2 L/min o2. o2 89% at end of gait.   Stairs             Wheelchair Mobility    Modified Rankin (Stroke Patients Only)       Balance             Standing balance-Leahy Scale: Good                              Cognition Arousal/Alertness: Awake/alert Behavior During Therapy: WFL for tasks assessed/performed Overall Cognitive Status: Within Functional Limits for tasks assessed                                         Exercises General Exercises - Lower Extremity Hip Flexion/Marching: Strengthening;Both;10 reps Other Exercises Other Exercises: Educated on energy conservation techniques including placing a chair at top and bottom of stairs so that when she is fatigued from managing those she can rest. Simulated stairs in room with standing hip flex. O2 89% with this activity on 2 L/min.  Her sister or brother is always with her on stairs and will carry o2 for her so she doesn't have  the weight of the canister.    General Comments        Pertinent Vitals/Pain Pain Assessment: Faces Faces Pain Scale: No hurt    Home Living                      Prior Function            PT Goals (current goals can now be found in the care plan section) Acute Rehab PT Goals Patient Stated Goal: to return to independent, breathe better PT Goal Formulation: With patient Time For Goal Achievement: 05/30/19 Potential to Achieve Goals: Good Progress towards PT goals: Progressing toward  goals    Frequency    Min 3X/week      PT Plan Current plan remains appropriate    Co-evaluation              AM-PAC PT "6 Clicks" Mobility   Outcome Measure  Help needed turning from your back to your side while in a flat bed without using bedrails?: None Help needed moving from lying on your back to sitting on the side of a flat bed without using bedrails?: None Help needed moving to and from a bed to a chair (including a wheelchair)?: None Help needed standing up from a chair using your arms (e.g., wheelchair or bedside chair)?: None Help needed to walk in hospital room?: None Help needed climbing 3-5 steps with a railing? : A Little 6 Click Score: 23    End of Session Equipment Utilized During Treatment: Oxygen Activity Tolerance: Patient tolerated treatment well;Patient limited by fatigue Patient left: in chair;with call bell/phone within reach   PT Visit Diagnosis:  Difficulty in walking, not elsewhere classified (R26.2)     Time: 0156-1537 PT Time Calculation (min) (ACUTE ONLY): 27 min  Charges:  $Gait Training: 8-22 mins $Therapeutic Activity: 8-22 mins                     Holly Mueller, [place] Pager [phone] 05/24/2019    Galen Manila 05/24/2019, 11:37 AM

## 2019-05-24 NOTE — Consult Note (Signed)
NAME:  Holly Mueller, MRN:  [id number], DOB:  [birth year], LOS: 1 ADMISSION DATE:  05/22/2019, CONSULTATION DATE:  1/12 REFERRING MD:  Dr. Chase Caller, CHIEF COMPLAINT:  ILD   Brief History    54 year old female with PMH as below, which is significant for CKD, DM, ILD on home O2, gastric varices, cirrhosis in the setting of biliary cholangitis, hypothyroidism, and seizures. She is followed by Dr. 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 She is on 2L O2 chronically. He considered starting chronic immunosuppression, but was concerned due to pancytopenia, and anti-fibrotic's are contraindicated in the setting of her hepatic disease. She was awaiting workup with GI to approve her for these meds. Hospice has been discussed, but she does not feel her disease has progressed far enough to warrant this.   She initially presented to [facility] ED on 1/4 with complaints of productive cough and progressive SOB x 1 week. Her COVID-19 test was negative and she was treated with inhaled bronchodilators and IV steroid. She improved and was discharged on prednisone taper. After returning home her symptoms worsened and she again presented to [facility] ED in the evening hours on 1/11 with these same complaints. Her COVID test was again negative. She was admitted to the [facility] for suspected pulmonary infection in the setting of underlying pulmonary disease. She was started on CAP coverage antibiotics, systemic steroids, and inhaled bronchodilators. 1/12 she didn't improve much, and with infectious source looking less likely, PCCM was consulted for ILD treatment.   December 2020 office note at the ILD center Follow-up probable UIP on CT scan of the chest November 2019 with worsening compared to July 2018. Female gender. Positive ANA and SSA associated with clubbing and bilateral basal Velcro crackles -. Associated with cirrhosis Hepatic cirrhosis ] due to primary biliary cholangitis  (HCC) and low platelets, and gastric varices with bleeding. BAL March 2020 with mixed ceullarity - 54% PMN nand 28% lymphs c/w UIP. On supportive care due to cirrhosis . Recommended disability end sept 2020.   Associated pancytopenia with a platelet count in the 60s and a white count in the1s  Last high-resolution CT chest July 2020  SYMPTOM SCALE - ILD 02/06/2019  05/09/2019   O2 use 2L exertion 2 L with exertion.  Shortness of Breath 0 -> 5 scale with 5 being worst (score 6 If unable to do)   At rest 2 3  Simple tasks - showers, clothes change, eating, shaving 4 3  Household (dishes, doing bed, laundry)    Shopping 5 4  Walking level at own pace 5 3  Walking keeping up with others of same age 85 5  Walking up Stairs 5 5  Walking up [place] 5 5  Total (40 - 48) Dyspnea Score 35 33  How bad is your cough? 5 6  How bad is your fatigue On and off 6         Simple office walk 250 feet x 3 laps goal with forehead probe 05/25/2018  02/06/2019  05/09/2019   O2 used Room air Room air Room air  Number laps completed 3 Aimed 3 but stoppled 6 tims in 1 lap Lobby to room  Comments about pace slow    Resting Pulse Ox/HR 100% and 70/min 92% and 98/min   Final Pulse Ox/HR 94% and 92/min 86% and 117/min Dropped to 86% on room air  Desaturated </= 88% no yes   Desaturated <=  3% points Yes, 6 points Yes, 8 points   Got Tachycardic >/= 90/min yes yes   Symptoms at end of test Mild dyspnea final lap dy   Miscellaneous comments x       Results for Holly Mueller, Holly "[name]" (MRN [id number]) as of 02/06/2019 10:25  Ref. Range 11/13/2016 11:21 07/12/2018 13:58 01/11/2019 10:08  FVC-Pre Latest Units: L 1.80 1.87 1.88  FVC-%Pred-Pre Latest Units: % 48 50 49  Results for Holly Mueller, Holly [name] "[name]" (MRN [id number]) as of 02/06/2019 10:25  Ref. 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      Significant Hospital Events   1/11 admit  Consults:  PCCM 1/12 >  Procedures:    Significant Diagnostic Tests:   Micro Data:  SARS-COV 2 1/11 swab  > neg Blood Cx 1/11 >  Antimicrobials:  CTX 1/11 >1/12 Azithromycin 1/11 >1/2  Interim history/subjective:   05/24/2019  0 cough much better and nearly gone after scheduled tussionex. HRCT without change in ILD (prob UIP) since July 2020 but has new nodular density rUL.Marland Kitchen She says she walked with PT and did wll on 2L [place] o2   Objective   Blood pressure (!) 102/53, pulse 88, temperature 98 F (36.7 C), temperature source Oral, resp. rate 18, SpO2 97 %.        Intake/Output Summary (Last 24 hours) at 05/24/2019 1336 Last data filed at 05/24/2019 1300 Gross per 24 hour  Intake 1240 ml  Output --  Net 1240 ml   There were no vitals filed for this visit.      General Appearance:  Looks same. Niot coughing with exam - this is better Head:  Normocephalic, without obvious abnormality, atraumatic Eyes:  PERRL - ye, conjunctiva/corneas - muddy     Ears:  Normal external ear canals, both ears Nose:  G tube - no bu has  [place] Throat:  ETT TUBE - no , OG tube - no Neck:  Supple,  No enlargement/tenderness/nodules Lungs: Clear to auscultation bilaterally but at base has ILD crackles Heart:  S1 and S2 normal, no murmur, CVP - no.  Pressors - no Abdomen:  Soft, no masses, no organomegaly Genitalia / Rectal:  Not done Extremities:  Extremities- intac5 but has clubbing Skin:  ntact in exposed areas . Sacral area - not examine Neurologic:  Sedation - non -> RASS - 0 . Moves all 4s - yes. CAM-ICU - neg . Orientation - x3+        LABS    PULMONARY No results for input(s): PHART, PCO2ART, PO2ART, HCO3, TCO2, O2SAT in the last 168 hours.  Invalid input(s): PCO2, PO2  CBC Recent Labs  Lab 05/22/19 1245 05/23/19 0437 05/24/19 0308  HGB 10.1* 9.8* 9.1*  HCT 32.3* 31.9* 28.1*  WBC 1.6* 1.1* 1.9*  PLT PLATELET CLUMPS NOTED ON SMEAR, COUNT APPEARS DECREASED 60* 67*    COAGULATION No results for input(s): INR in the last 168 hours.  CARDIAC  No results for input(s): TROPONINI in the last 168 hours. No results for input(s): PROBNP in the last 168 hours.   CHEMISTRY Recent Labs  Lab 05/22/19 1245 05/23/19 0437 05/24/19 0308  NA 136 131* 135  K 3.7 4.2 3.5  CL 108 105 106  CO2 22 19* 21*  GLUCOSE 297* 508* 201*  BUN _0 CREATININE 0.66 0.85 0.70  CALCIUM 8.7* 8.3* 8.2*   CrCl cannot be calculated (Unknown ideal weight.).   LIVER Recent Labs  Lab 05/23/19 0437 05/24/19 0308  AST 47* 28  ALT 35 29  ALKPHOS 136* 113  BILITOT 2.5* 1.4*  PROT 7.0 6.4*  ALBUMIN 2.5* 2.4*     INFECTIOUS No results for input(s): LATICACIDVEN, PROCALCITON in the last 168 hours.   ENDOCRINE CBG (last 3)  Recent Labs    05/23/19 2022 05/24/19 0742 05/24/19 1203  GLUCAP 219* 188* 210*         IMAGING x48h  - image(s) personally visualized  -   highlighted in bold DG Chest 2 View  Result Date: 05/23/2019 CLINICAL DATA:  Pneumonia. Shortness of breath/midline chest congestion for 2  weeks, recent COVID test yesterday (negative). EXAM: CHEST - 2 VIEW COMPARISON:  Chest radiograph 05/22/2019 FINDINGS: Cardiomediastinal silhouette unchanged. Shallow inspiration radiograph. Probable mild incomplete atelectasis at the medial left lung base. No definite airspace consolidation. No evidence of pleural effusion or pneumothorax. No acute bony abnormality. Overlying cardiac monitoring leads. IMPRESSION: Shallow inspiration radiograph. Probable mild incomplete atelectasis at the medial left lung base. No definite airspace consolidation. Electronically Signed   By: Kellie Simmering DO   On: 05/23/2019 07:59   CT Chest High Resolution  Result Date: 05/24/2019 CLINICAL DATA:  54 year old female with history of acute respiratory failure with hypoxia. Evaluate for interstitial lung disease. EXAM: CT CHEST WITHOUT CONTRAST TECHNIQUE: Multidetector CT imaging of the chest was performed following the standard protocol without intravenous contrast. High resolution imaging of the lungs, as well as inspiratory and expiratory imaging, was performed. COMPARISON:  Chest CT 12/09/2018. FINDINGS: Cardiovascular: Heart size is mildly enlarged. There is no significant pericardial fluid, thickening or pericardial calcification. Aortic atherosclerosis. No definite coronary artery calcifications. Mild calcifications of the aortic valve. Mediastinum/Nodes: No pathologically enlarged mediastinal or hilar lymph nodes. Please note that accurate exclusion of hilar adenopathy is limited on noncontrast CT scans. Esophagus is unremarkable in appearance. No axillary lymphadenopathy. Lungs/Pleura: In the periphery of the right upper lobe near the apex (axial image 24 of series 11) there is a new well-defined 2.7 x 1.4 cm area of ground-glass attenuation. High-resolution images otherwise demonstrate patchy areas of peripheral predominant ground-glass attenuation, septal thickening, regional architectural distortion, scattered regions of  mild cylindrical bronchiectasis and peripheral bronchiolectasis. No frank honeycombing. These findings appear similar to the prior study (although direct comparison is limited by extensive respiratory motion on the prior study) and have a mild craniocaudal gradient. Inspiratory and expiratory imaging is unremarkable. No pleural effusions. Upper Abdomen: Liver has a shrunken appearance and nodular contour, suggesting underlying cirrhosis. Mild diffuse low attenuation throughout the visualized hepatic parenchyma, indicative of hepatic steatosis. Spleen is incompletely imaged, but appears enlarged measuring at least 14.7 cm on axial images. Postoperative changes in the proximal stomach incompletely imaged. Musculoskeletal: There are no aggressive appearing lytic or blastic lesions  noted in the visualized portions of the skeleton. IMPRESSION: 1. Similar appearance of the lungs with a spectrum of findings considered probable usual interstitial pneumonia (UIP) per current ats guidelines. 2. New nodular area of ground-glass attenuation in the periphery of the right upper lobe. This could be of infectious or inflammatory etiology, or could represent an area of active inflammation and developing fibrosis. Attention on follow-up high-resolution chest CT is recommended in 6-12 months. 3. Aortic atherosclerosis. 4. There are calcifications of the aortic valve. Echocardiographic correlation for evaluation of potential valvular dysfunction may be warranted if clinically indicated. 5. Hepatic steatosis. Aortic Atherosclerosis (ICD10-I70.0). Electronically Signed   By: Vinnie Langton M.D.   On: 05/24/2019 08:16   DG Chest Port 1 View  Result Date: 05/22/2019 CLINICAL DATA:  Cough. EXAM: PORTABLE CHEST 1 VIEW COMPARISON:  May 15, 2019. FINDINGS: Stable cardiomediastinal silhouette. No pneumothorax is noted. Right lung is clear. Minimal left basilar subsegmental atelectasis or infiltrate is noted. No significant pleural  effusion is noted. Bony thorax is unremarkable. IMPRESSION: Minimal left basilar subsegmental atelectasis or infiltrate is noted. No significant pleural effusion is noted. Electronically Signed   By: Marijo Conception M.D.   On: 05/22/2019 15:30    ASSESSMENT/PLAN  #IL:D - probably UIP on HRCT. This clinically translates into IPF (clubbing) v IPAF (2 autoimmune antibodies somewhat positive)  - dififcult role for established anti-fbirotics due to liver cirrhosis  - difficult to do immune suppressants due to panctyopenia - CT is unchanged since summer 2020 and needing only 2L [place] -> implies maybe she is NOT hospice prognossi but needs palliative Rx for cough  Plan - get repeat PFt in hospital to look for progression  - can assess for IV or inhaler clinical trials - recheck ANA, DSDNA, C3 and C4  #RUL density  ->? Cause//NEw  Plan  - check urine strep  - check urine leg - check Procal  - check RVP - need followup CT in 6 months  #Cough - maintly due to ILD  Plan  - tussionex - consider home palliative care  (does not need hospice as of now) for symptom control   Ccm will follow   SIGNATURE    Dr. Brand Males, M.D., F.C.C.P,  Pulmonary and Critical Care Medicine Staff Physician, [place] Director - Interstitial Lung Disease  Program  Pulmonary [place] at [place], [place], [address]  Pager: [phone], If no answer or between  15:00h - 7:00h: call [phone] Telephone: [phone]  1:49 PM 05/24/2019

## 2019-05-24 NOTE — Evaluation (Signed)
Occupational Therapy Evaluation Patient Details Name: Holly Mueller MRN: [id number] DOB: 1965/08/27 Today's Date: 05/24/2019    History of Present Illness Patient is a 54 y/o female admitted due to SOB, cough, aches, negative for Covid.  PMH positive for primary biliary colangitis, hepatic cirrhosis, ILD, GERD, DM, hypothyroidism and pancytopenia.   Clinical Impression   Pt is at Mod I level with ADLs/selfcare and mobility using no AD. Pt aware that she needs to pace herself and take extra time for ADLs/selfcare. Educated pt on energy conservation techniques with handout provided. Educated pt on ADL A/E for home use with A/E provided. No further acute OT is indicated at this time, OT will sign off    Follow Up Recommendations  No OT follow up    Equipment Recommendations  Other (comment)(ADL A/E kit)    Recommendations for Other Services       Precautions / Restrictions Precautions Precautions: Fall Precaution Comments: on home O2 Restrictions Weight Bearing Restrictions: No      Mobility Bed Mobility Overal bed mobility: Modified Independent                Transfers Overall transfer level: Modified independent                    Balance Overall balance assessment: Needs assistance   Sitting balance-Leahy Scale: Normal     Standing balance support: During functional activity Standing balance-Leahy Scale: Good                             ADL either performed or assessed with clinical judgement   ADL Overall ADL's : Modified independent                                       General ADL Comments: pt aware that she needs ot pace herself and take extra time for ADLs/selfcare. Educated pt on energy conservation techniques with handout provided. Educated pt on ADL A/E for home use with A/E provided     Vision Patient Visual Report: No change from baseline       Perception     Praxis      Pertinent Vitals/Pain  Pain Assessment: No/denies pain Faces Pain Scale: No hurt Pain Intervention(s): Monitored during session     Hand Dominance Right   Extremity/Trunk Assessment Upper Extremity Assessment Upper Extremity Assessment: Overall WFL for tasks assessed   Lower Extremity Assessment Lower Extremity Assessment: Defer to PT evaluation   Cervical / Trunk Assessment Cervical / Trunk Assessment: Kyphotic   Communication Communication Communication: No difficulties   Cognition Arousal/Alertness: Awake/alert Behavior During Therapy: WFL for tasks assessed/performed Overall Cognitive Status: Within Functional Limits for tasks assessed                                     General Comments       Exercises General Exercises - Lower Extremity Hip Flexion/Marching: Strengthening;Both;10 reps Other Exercises Other Exercises: Educated on energy conservation techniques including placing a chair at top and bottom of stairs so that when she is fatigued from managing those she can rest. Simulated stairs in room with standing hip flex. O2 89% with this activity on 2 L/min.  Her sister or brother is always with her on stairs and will  carry o2 for her so she doesn't have  the weight of the canister.   Shoulder Instructions      Home Living Family/patient expects to be discharged to:: Private residence Living Arrangements: Other relatives Available Help at Discharge: Family;Available 24 hours/day Type of Home: Mobile home Home Access: Stairs to enter Entrance Stairs-Number of Steps: 6 Entrance Stairs-Rails: Right       Bathroom Shower/Tub: Hospital doctor Toilet: Handicapped height     Home Equipment: Environmental consultant - 2 wheels          Prior Functioning/Environment Level of Independence: Independent                 OT Problem List: Cardiopulmonary status limiting activity;Decreased activity tolerance      OT Treatment/Interventions: Self-care/ADL training;DME  and/or AE instruction;Energy conservation;Patient/family education    OT Goals(Current goals can be found in the care plan section) Acute Rehab OT Goals Patient Stated Goal: to return to independent, breathe better  OT Frequency:     Barriers to D/C:    no barriers       Co-evaluation              AM-PAC OT "6 Clicks" Daily Activity     Outcome Measure Help from another person eating meals?: None Help from another person taking care of personal grooming?: None Help from another person toileting, which includes using toliet, bedpan, or urinal?: None Help from another person bathing (including washing, rinsing, drying)?: None Help from another person to put on and taking off regular upper body clothing?: None Help from another person to put on and taking off regular lower body clothing?: None 6 Click Score: 24   End of Session Equipment Utilized During Treatment: Oxygen  Activity Tolerance: Patient tolerated treatment well Patient left: in bed  OT Visit Diagnosis: Other abnormalities of gait and mobility (R26.89)                Time: 5643-3295 OT Time Calculation (min): 25 min Charges:  OT General Charges $OT Visit: 1 Visit OT Evaluation $OT Eval Low Complexity: 1 Low OT Treatments $Self Care/Home Management : 8-22 mins    Britt Bottom 05/24/2019, 12:24 PM

## 2019-05-25 ENCOUNTER — Encounter: Payer: Medicaid Other | Admitting: Internal Medicine

## 2019-05-25 ENCOUNTER — Encounter (HOSPITAL_COMMUNITY): Payer: Medicaid Other

## 2019-05-25 ENCOUNTER — Inpatient Hospital Stay (HOSPITAL_COMMUNITY): Payer: Medicaid Other

## 2019-05-25 DIAGNOSIS — J849 Interstitial pulmonary disease, unspecified: Secondary | ICD-10-CM

## 2019-05-25 DIAGNOSIS — Z9981 Dependence on supplemental oxygen: Secondary | ICD-10-CM

## 2019-05-25 DIAGNOSIS — R911 Solitary pulmonary nodule: Secondary | ICD-10-CM

## 2019-05-25 LAB — CBC WITH DIFFERENTIAL/PLATELET
Abs Immature Granulocytes: 0.02 10*3/uL (ref 0.00–0.07)
Basophils Absolute: 0 10*3/uL (ref 0.0–0.1)
Basophils Relative: 2 %
Eosinophils Absolute: 0.1 10*3/uL (ref 0.0–0.5)
Eosinophils Relative: 4 %
HCT: 30.9 % — ABNORMAL LOW (ref 36.0–46.0)
Hemoglobin: 9.6 g/dL — ABNORMAL LOW (ref 12.0–15.0)
Immature Granulocytes: 1 %
Lymphocytes Relative: 25 %
Lymphs Abs: 0.4 10*3/uL — ABNORMAL LOW (ref 0.7–4.0)
MCH: 26.7 pg (ref 26.0–34.0)
MCHC: 31.1 g/dL (ref 30.0–36.0)
MCV: 86.1 fL (ref 80.0–100.0)
Monocytes Absolute: 0.2 10*3/uL (ref 0.1–1.0)
Monocytes Relative: 15 %
Neutro Abs: 0.8 10*3/uL — ABNORMAL LOW (ref 1.7–7.7)
Neutrophils Relative %: 53 %
Platelets: 63 10*3/uL — ABNORMAL LOW (ref 150–400)
RBC: 3.59 MIL/uL — ABNORMAL LOW (ref 3.87–5.11)
RDW: 14.5 % (ref 11.5–15.5)
WBC: 1.5 10*3/uL — ABNORMAL LOW (ref 4.0–10.5)
nRBC: 0 % (ref 0.0–0.2)

## 2019-05-25 LAB — MAGNESIUM: Magnesium: 1.9 mg/dL (ref 1.7–2.4)

## 2019-05-25 LAB — PULMONARY FUNCTION TEST
DL/VA % pred: 85 %
DL/VA: 3.6 ml/min/mmHg/L
DLCO unc % pred: 45 %
DLCO unc: 9.91 ml/min/mmHg
FEF 25-75 Pre: 0.97 L/sec
FEF2575-%Pred-Pre: 36 %
FEV1-%Pred-Pre: 51 %
FEV1-Pre: 1.48 L
FEV1FVC-%Pred-Pre: 107 %
FEV6-%Pred-Pre: 49 %
FEV6-Pre: 1.74 L
FEV6FVC-%Pred-Pre: 103 %
FVC-%Pred-Pre: 47 %
FVC-Pre: 1.74 L
Pre FEV1/FVC ratio: 85 %
Pre FEV6/FVC Ratio: 100 %

## 2019-05-25 LAB — GLUCOSE, CAPILLARY
Glucose-Capillary: 123 mg/dL — ABNORMAL HIGH (ref 70–99)
Glucose-Capillary: 167 mg/dL — ABNORMAL HIGH (ref 70–99)
Glucose-Capillary: 220 mg/dL — ABNORMAL HIGH (ref 70–99)
Glucose-Capillary: 139 mg/dL — ABNORMAL HIGH (ref 70–99)

## 2019-05-25 LAB — LEGIONELLA PNEUMOPHILA SEROGP 1 UR AG: L. pneumophila Serogp 1 Ur Ag: NEGATIVE

## 2019-05-25 LAB — COLD AGGLUTININ TITER: Cold Agglutinin Titer: 1:8 {titer}

## 2019-05-25 LAB — PHOSPHORUS: Phosphorus: 3.4 mg/dL (ref 2.5–4.6)

## 2019-05-25 LAB — C4 COMPLEMENT: Complement C4, Body Fluid: 7 mg/dL — ABNORMAL LOW (ref 12–38)

## 2019-05-25 LAB — ANTI-DNA ANTIBODY, DOUBLE-STRANDED: ds DNA Ab: 2 IU/mL (ref 0–9)

## 2019-05-25 LAB — PROCALCITONIN: Procalcitonin: <0.1 ng/mL

## 2019-05-25 LAB — C3 COMPLEMENT: C3 Complement: 58 mg/dL — ABNORMAL LOW (ref 82–167)

## 2019-05-25 MED ORDER — MAGNESIUM SULFATE 2 GM/50ML IV SOLN
2.0000 g | Freq: Once | INTRAVENOUS | Status: AC
  Administered 2019-05-25: 2 g via INTRAVENOUS
  Filled 2019-05-25: qty 50

## 2019-05-25 NOTE — Plan of Care (Signed)
  Problem: Education: Goal: Knowledge of General Education information will improve Description: Including pain rating scale, medication(s)/side effects and non-pharmacologic comfort measures Outcome: Progressing   Problem: Clinical Measurements: Goal: Ability to maintain clinical measurements within normal limits will improve Outcome: Progressing   Problem: Nutrition: Goal: Adequate nutrition will be maintained Outcome: Progressing   Problem: Coping: Goal: Level of anxiety will decrease Outcome: Progressing   Problem: Pain Managment: Goal: General experience of comfort will improve Outcome: Progressing   Problem: Safety: Goal: Ability to remain free from injury will improve Outcome: Progressing   Problem: Skin Integrity: Goal: Risk for impaired skin integrity will decrease Outcome: Progressing

## 2019-05-25 NOTE — Progress Notes (Signed)
IPAF DIAGNOSTIC CRITERIA  - formally weighing on IPF v IPAF. BAsed on below patient meets  Criteria for IPAF - see comment in red    Classification criteria for "interstitial pneumonia with autoimmune features (IPAF)"  Presence of an interstitial pneumonia (by HRCT or surgical lung biopsy) - yes on HRCT  Exclusion of alternative etiologies and, - no other competing etio9logy   Does not meet criteria of a defined connective tissue disease and,- YES  At least one feature from at least two of these domains: - Clinical domain - NO - Serologic domain - YES - Morphologic domain - YES  A. Clinical domain  - NONNE OF THIS IS PRESENT FOR THE PATIENT - Distal digital fissuring (ie, "mechanic hands") -Distal digital tip ulceration - Inflammatory arthritis or polyarticular morning joint stiffness ?60 minutes - Palmar telangiectasia - Raynaud phenomenon - Unexplained digital edema - Unexplained fixed rash on the digital extensor surfaces (Gottron's sign)  B. Serologic domain - HERE SSA is positive (other non specific findings - low complement and low titer ANA +) - ANA ?1:320 titer, diffuse, speckled, homogeneous patterns or ANA nucleolar pattern (any titer) or ANA centromere pattern (any titer)  -Rheumatoid factor ?2 upper limit of normal -Anti-CCP -Anti-dsDNA -Anti-Ro (SS-A) -Anti-La (SS-B) Anti-ribonucleoprotein -Anti-Smith -Anti-topoisomerase (Scl-70) -Anti-tRNA synthetase (eg, Jo-1, PL-7, PL-12; others are: EJ, OJ, KS, Zo, tRS) -Anti-PM-Scl - Anti-MDA-5  C. Morphologic domain - she has probable UIP per HRCT Suggestive radiology patterns by HRCT of IIP NSIP OP NSIP with OP overlap LIP Histopathology patterns or features by surgical lung biopsy: NSIP OP NSIP with OP overlap LIP Interstitial lymphoid aggregates with germinal centers Diffuse lymphoplasmacytic infiltration (with or without lymphoid follicles)  Multi-compartment involvement (in addition to interstitial  pneumonia): Unexplained pleural effusion or thickening Unexplained pericardial effusion or thickening Unexplained intrinsic airways disease* (by PFT, imaging or pathology) Unexplained pulmonary vasculopathy     SIGNATURE    Dr. Brand Males, M.D., F.C.C.P,  Pulmonary and Critical Care Medicine Staff Physician, [place] Director - Interstitial Lung Disease  Program  Pulmonary [place] at [place], [place], [address]  Pager: [phone], If no answer or between  15:00h - 7:00h: call [phone] Telephone: [phone]  8:50 AM 05/25/2019

## 2019-05-25 NOTE — Consult Note (Signed)
Consultation Note Date: 05/25/2019   Patient Name: Holly Mueller  DOB: Oct 04, 1965  MRN: [id number]  Age / Sex: 54 y.o., female  PCP: Cleophas Dunker, DO Referring Physician: Dickie La, MD  Reason for Consultation: Establishing goals of care  HPI/Patient Profile: 54 y.o. female  with past medical history of ILD, hypothryoidism, idiopathic cirrhosis, T2DM, chronic pancytopenia admitted on 05/22/2019 with worsening cough and SOB. Workup reveals worsening lung function due to ILD (autoimmune). Covid negative. Palliative consult for goals of care and symptom management.    Clinical Assessment and Goals of Care: Met at bedside with patient and her sister.  Holly Mueller lives with her sister and brother- goes back and forth. She is no longer able to work. She enjoyed her previous job and misses being able to work.  Palliative medicine was introduced. We had extended discussion regarding the difference between Palliative and Hospice. Palliative medicine is specialized medical care for people living with serious illness. It focuses on providing relief from the symptoms and stress of a serious illness. The goal is to improve quality of life for both the patient and the family. Her chronic illnesses and possible trajectories were reviewed in detail with patient and her sister- Holly Mueller.  Dr. Chase Caller presented during our visit and discussed the difficulty in managing patient's ILD due to her hepatic illness, which complicates one of the available treatments.  We discussed symptoms that decrease her quality of life- primarily cough, shortness of breath, depression and anxiety. We discussed options for medical management of these symptoms- antidepressant, opioid medication. She would likely benefit from daily decadron as well, however, she had great increase in her blood sugar with prednisone and does not want  steroid. Holly Mueller tells me that she "just doesn't want to add additional medications" to her regimen. We discussed nonpharmacological options- lowering temperature, fan at bedside blowing directly on trigeminal nerve area (cheek), grouping activities when she is feeling well, meditation and relaxation methods.  Advanced care planning was discussed. Patient wishes to complete Living Will and HCPOA. Comfort care vs full ongoing aggressive medical care was discussed in light of her advanced chronic illnesses. For now, patient desires all aggressive care. We discussed code status. She wishes to remain full code. We discussed reasons that she may want to change code status- at close of conversation she said she had not thought about these things in the way that I presented them to her and she would really have to do some "deep soul searching" but she realizes she has some important decisions to make.  She has financial concerns regarding medicare, disability and having certain equipment and medications paid for. We discussed the usefullness of care management and social work to help with these- as well as a visit from outpatient Education officer, museum.  We discussed option-risks of starting anti-fibrotic medication. She at first stands by that she doesn't want another medication, but she does want to continue aggressive care and possible treatments. We discussed what continued aggressive care means and the benefits/risks of  including the additional medication as presented by Dr. Chase Caller. At close of discussion patient decided she wished to trial the anti-fibrotic knowing that if she didn't tolerate it, she could always make the decision to stop.     Primary Decision Maker PATIENT- patient would like her sister to her HCPOA     SUMMARY OF RECOMMENDATIONS -Symptom management:    -Cough/SOB-   -Decrease temperature, Fan at bedside for   -Continue tussionex and tessalon- these seem to be working well  for her right now-  I would have a low threshold for changing these  to prn morphine if they become no longer effective.  -Grouping of ADL's and other activities- may benefit from some  home OT/PT to show ways to decrease energy expenditure- eval  for additional equipment  -Depression/Anxiety- declines medication- encouraged meditation  and relaxation techniques, sharing feelings with family  -Financial concerns- transition of care referral for resources  -Referred for outpatient Palliative followup -Goals of care/Advanced care planning-   -Full scope care- recommend Palliative continue discussion at home  -Full code- but patient to be considering code change- recommend  outpatient Palliative continue this discussion in the home  -HCPOA/Living Will given- Chaplain consult for completion      Code Status/Advance Care Planning:  Full code  Palliative Prophylaxis:   Frequent Pain Assessment- Frequent assessment for SOB  Psycho-social/Spiritual:   Desire for further Chaplaincy support:yes  Additional Recommendations: Medicaid/Financial Assistance  Prognosis:    Unable to determine  Discharge Planning: Home with Palliative Services  Primary Diagnoses: Present on Admission: **None**   I have reviewed the medical record, interviewed the patient and family, and examined the patient. The following aspects are pertinent.  Past Medical History:  Diagnosis Date  . Acute respiratory failure with hypoxia ([place]) 09/22/2018  . Anxiety    "anxiety attacks- sometimes"  . Arthritis    bursitis left hip flares-not an issue  . Chronic kidney disease   . Community acquired pneumonia 11/28/2018  . Diabetes mellitus without complication ([place])    Type II  . Dysrhythmia   . Edentulous    10-19-13 at present  . Epilepsy ([place]) in 1972   No seizures since 1972. Previously treated with phenobarbital.   . Gastric varices with bleeding   . GERD (gastroesophageal reflux disease) 2008  . Head injury    fell hit head  in cafeteria- co- workers said she briefly lost consciousnesss  . Headache(784.0)    hx of migraines   . Heart murmur   . 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 History of blood transfusion    gi bleed  . History of kidney stones    passed  . Hypothyroidism 2008  . Interstitial lung disease ([place])   . Leukopenia   . Lower esophageal ring 08/18/2013  . Pneumonia   . Primary biliary cholangitis ([place]) 10/24/2018  . Seasonal allergies 2003  . SEIZURES, HX OF 12/12/2009   Annotation: last seizure in early 1970s Qualifier: Diagnosis of  By: Carlena Sax  MD, Colletta [place]    . Shortness of breath    walking  . Wears glasses    Social History   Socioeconomic History  . Marital status: Single    Spouse name: Not on file  . Number of children: 2  . Years of education: Not on file  . Highest education level: Not on file  Occupational History  . Occupation: unemployed  Tobacco Use  . Smoking status: Never Smoker  .  Smokeless tobacco: Never Used  . Tobacco comment: 04/25/2019- "I smoke part of a cigarette and put it out,I did not like it."  Substance and Sexual Activity  . Alcohol use: Not Currently  . Drug use: No  . Sexual activity: Not Currently  Other Topics Concern  . 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 Difficulty of Paying Living Expenses: Very hard  Food Insecurity:   . Worried About Charity fundraiser in the Last Year: Not on file  . Ran Out of Food in the Last Year: Not on file  Transportation Needs:   . Lack of Transportation (Medical): Not on file  . Lack of Transportation (Non-Medical): Not on file  Physical Activity:   . Days of Exercise per Week: Not on file  . Minutes of Exercise per Session: Not on  file  Stress:   . Feeling of Stress : Not on file  Social Connections:   . Frequency of Communication with Friends and Family: Not on file  . Frequency of Social Gatherings with Friends and Family: Not on file  . Attends Religious Services: Not on file  . Active Member of Clubs or Organizations: Not on file  . Attends Archivist Meetings: Not on file  . Marital Status: Not on file   Family History  Problem Relation Age of Onset  . Hypertension Mother   . Diabetes Mother   . Cirrhosis Mother        ? medications  . Lung cancer Father   . Stroke Father   . Diabetes Sister   . Hypertension Sister   . Colon cancer Neg Hx   . Esophageal cancer Neg Hx   . Rectal cancer Neg Hx    Scheduled Meds: . benzonatate  100 mg Oral Q8H  . chlorpheniramine-HYDROcodone  5 mL Oral Q12H  . enoxaparin (LOVENOX) injection  60 mg Subcutaneous Q24H  . insulin aspart  0-15 Units Subcutaneous TID WC  . insulin glargine  10 Units Subcutaneous Daily  . ipratropium-albuterol  3 mL Nebulization BID  . levothyroxine  250 mcg Oral QAC breakfast  . loratadine  10 mg Oral Daily  . mometasone-formoterol  2 puff Inhalation BID  . pantoprazole  40 mg Oral BID AC  . ursodiol  600 mg Oral TID   Continuous Infusions: . magnesium sulfate bolus IVPB     PRN Meds:.acetaminophen, albuterol, diclofenac Sodium Medications Prior to Admission:  Prior to Admission medications   Medication Sig Start Date End Date Taking? Authorizing Provider  acetaminophen (TYLENOL) 500 MG tablet Take 1,000 mg by mouth every 6 (six) hours as needed for mild pain.   Yes [provider]  albuterol (PROAIR HFA) 108 (90 Base) MCG/ACT inhaler INHALE 2 PUFFS INTO THE LUNGS EVERY 6 HOURS AS NEEDED FOR SHORTNESS OF BREATH Patient taking differently: Inhale 2 puffs into the lungs every 4 (four) hours as needed for wheezing or shortness of breath.  04/25/18  Yes Rory Percy, DO  albuterol (PROVENTIL) (2.5 MG/3ML) 0.083%  nebulizer solution Take 3 mLs (2.5 mg total) by nebulization every 6 (six) hours as needed for wheezing or shortness of breath. 04/10/19  Yes Ramaswamy,  Belva Crome, MD  budesonide-formoterol (SYMBICORT) 80-4.5 MCG/ACT inhaler Inhale 2 puffs into the lungs 2 (two) times daily for 1 day. 01/11/19 03/08/20 Yes Lauraine Rinne, NP  cetirizine (ZYRTEC) 10 MG tablet Take 1 tablet (10 mg total) by mouth daily. 01/27/17  Yes Glenis Smoker, MD  dextromethorphan-guaiFENesin [facility] DM) 30-600 MG 12hr tablet Take 1 tablet by mouth 2 (two) times daily as needed for cough.   Yes [provider]  fluticasone (FLONASE) 50 MCG/ACT nasal spray Place 2 sprays into both nostrils daily.   Yes [provider]  levothyroxine (SYNTHROID) 125 MCG tablet TAKE 2 TABLETS (250 MCG TOTAL) BY MOUTH DAILY. Patient taking differently: Take 125 mcg by mouth 2 (two) times daily.  04/12/19  Yes Meccariello, Bernita Raisin, DO  metFORMIN (GLUCOPHAGE) 500 MG tablet Take 1 tablet (500 mg total) by mouth 2 (two) times daily with a meal. 12/06/18  Yes Meccariello, Bernita Raisin, DO  OXYGEN Inhale 2 L into the lungs continuous.    Yes [provider]  pantoprazole (PROTONIX) 40 MG tablet Take 1 tablet (40 mg total) by mouth 2 (two) times daily before a meal. 10/17/18  Yes Glenis Smoker, MD  SPIRIVA HANDIHALER 18 MCG inhalation capsule INHALE 1 CAPSULE VIA HANDIHALER ONCE DAILY AT [place] Patient taking differently: Place 18 mcg into inhaler and inhale daily.  01/09/19  Yes Meccariello, Bernita Raisin, DO  ursodiol (ACTIGALL) 500 MG tablet Take 1 tablet (500 mg total) by mouth 3 (three) times daily. 10/26/18  Yes Gatha Mayer, MD  benzonatate (TESSALON) 100 MG capsule Take 1 capsule (100 mg total) by mouth every 8 (eight) hours. Patient not taking: Reported on 05/22/2019 05/15/19   Tedd Sias, PA  blood glucose meter kit and supplies Dispense based on patient and insurance preference. Use up to four times  daily as directed. (FOR ICD-10 E10.9, E11.9). 09/02/18   Dessa Phi, DO  dextromethorphan 15 MG/5ML syrup Take 10 mLs (30 mg total) by mouth 4 (four) times daily as needed for cough. Patient not taking: Reported on 05/22/2019 05/15/19   Tedd Sias, PA  glucose blood (TRUE METRIX BLOOD GLUCOSE TEST) test strip Use as instructed 09/16/18   Glenis Smoker, MD  ondansetron (ZOFRAN ODT) 4 MG disintegrating tablet Take 1-2 tablets (4-8 mg total) by mouth every 8 (eight) hours as needed for nausea or vomiting. Patient not taking: Reported on 05/22/2019 04/26/19   Mansouraty, Telford Nab., MD  predniSONE (DELTASONE) 10 MG tablet Take 20 mg by mouth 2 (two) times daily with a meal.     [provider]  tamsulosin (FLOMAX) 0.4 MG CAPS capsule Take 1 capsule (0.4 mg total) by mouth daily. Patient not taking: Reported on 05/22/2019 02/10/19   Benay Pike, MD  TRUEplus Lancets 28G MISC Use to check blood sugar as directed. 09/16/18   Glenis Smoker, MD   Allergies  Allergen Reactions  . Aspirin Nausea Only and Other (See Comments)    Upset stomach   Review of Systems  Physical Exam  Vital Signs: BP 125/68 (BP Location: Right Arm)   Pulse 87   Temp 97.6 F (36.4 C) (Oral)   Resp 19   Ht 5' 5.5" (1.664 m)   Wt 109.2 kg   LMP  (LMP Unknown)   SpO2 97%   BMI 39.45 kg/m  Pain Scale: 0-10   Pain Score: 0-No pain   SpO2: SpO2: 97 % O2 Device:SpO2: 97 % O2 Flow  Rate: .O2 Flow Rate (L/min): 2 L/min  IO: Intake/output summary:   Intake/Output Summary (Last 24 hours) at 05/25/2019 1716 Last data filed at 05/25/2019 0900 Gross per 24 hour  Intake 480 ml  Output -  Net 480 ml    LBM: Last BM Date: 05/24/19(Simultaneous filing. User may not have seen previous data.) Baseline Weight: Weight: 109.2 kg Most recent weight: Weight: 109.2 kg     Palliative Assessment/Data: PPS: 50%      Thank you for this consult. Palliative medicine will continue to follow and assist  as needed.   Time In: 1500 Time Out: 1700 Time Total: 120 minutes Prolonged services billed- Yes Greater than 50%  of this time was spent counseling and coordinating care related to the above assessment and plan.  Signed by: Mariana Kaufman, AGNP-C Palliative Medicine    Please contact Palliative Medicine Team phone at [phone] for questions and concerns.  For individual provider: See Shea Evans

## 2019-05-25 NOTE — Telephone Encounter (Signed)
done

## 2019-05-25 NOTE — Progress Notes (Signed)
NAME:  Holly Mueller, MRN:  [id number], DOB:  1965/11/07, LOS: 2 ADMISSION DATE:  05/22/2019, CONSULTATION DATE:  1/12 REFERRING MD:  Dr. Chase Caller, CHIEF COMPLAINT:  ILD   Brief History    54 year old female with PMH as below, which is significant for CKD, DM, ILD on home O2, gastric varices, cirrhosis in the setting of biliary cholangitis, hypothyroidism, and seizures. She is followed by Dr. 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 She is on 2L O2 chronically. He considered starting chronic immunosuppression, but was concerned due to pancytopenia, and anti-fibrotic's are contraindicated in the setting of her hepatic disease. She was awaiting workup with GI to approve her for these meds. Hospice has been discussed, but she does not feel her disease has progressed far enough to warrant this.   She initially presented to [facility] ED on 1/4 with complaints of productive cough and progressive SOB x 1 week. Her COVID-19 test was negative and she was treated with inhaled bronchodilators and IV steroid. She improved and was discharged on prednisone taper. After returning home her symptoms worsened and she again presented to [facility] ED in the evening hours on 1/11 with these same complaints. Her COVID test was again negative. She was admitted to the [facility] for suspected pulmonary infection in the setting of underlying pulmonary disease. She was started on CAP coverage antibiotics, systemic steroids, and inhaled bronchodilators. 1/12 she didn't improve much, and with infectious source looking less likely, PCCM was consulted for ILD treatment.   December 2020 office note at the ILD center Follow-up probable UIP on CT scan of the chest November 2019 with worsening compared to July 2018. Female gender. Positive ANA and SSA associated with clubbing and bilateral basal Velcro crackles -. Associated with cirrhosis Hepatic cirrhosis ] due to primary biliary cholangitis  (HCC) and low platelets, and gastric varices with bleeding. BAL March 2020 with mixed ceullarity - 54% PMN nand 28% lymphs c/w UIP. On supportive care due to cirrhosis . Recommended disability end sept 2020.   Associated pancytopenia with a platelet count in the 60s and a white count in the1s  Last high-resolution CT chest July 2020  SYMPTOM SCALE - ILD 02/06/2019  05/09/2019   O2 use 2L exertion 2 L with exertion.  Shortness of Breath 0 -> 5 scale with 5 being worst (score 6 If unable to do)   At rest 2 3  Simple tasks - showers, clothes change, eating, shaving 4 3  Household (dishes, doing bed, laundry)    Shopping 5 4  Walking level at own pace 5 3  Walking keeping up with others of same age 56 5  Walking up Stairs 5 5  Walking up [place] 5 5  Total (40 - 48) Dyspnea Score 35 33  How bad is your cough? 5 6  How bad is your fatigue On and off 6         Simple office walk 250 feet x 3 laps goal with forehead probe 05/25/2018  02/06/2019  05/09/2019   O2 used Room air Room air Room air  Number laps completed 3 Aimed 3 but stoppled 6 tims in 1 lap Lobby to room  Comments about pace slow    Resting Pulse Ox/HR 100% and 70/min 92% and 98/min   Final Pulse Ox/HR 94% and 92/min 86% and 117/min Dropped to 86% on room air  Desaturated </= 88% no yes   Desaturated <=  3% points Yes, 6 points Yes, 8 points   Got Tachycardic >/= 90/min yes yes   Symptoms at end of test Mild dyspnea final lap dy   Miscellaneous comments x       Results for Holly, [name] (MRN [id number]) as of 02/06/2019 10:25  Ref. Range 11/13/2016 11:21 07/12/2018 13:58 01/11/2019 10:08  FVC-Pre Latest Units: L 1.80 1.87 1.88  FVC-%Pred-Pre Latest Units: % 48 50 49  Results for Holly, [name] (MRN [id number]) as of 02/06/2019 10:25  Ref. Range 11/13/2016 11:21 07/12/2018 13:58 01/11/2019 10:08  DLCO unc Latest Units: ml/min/mmHg  14.68 11.45  DLCO unc % pred Latest  Units: %  66 50         Past Medical History   has a past medical history of Acute respiratory failure with hypoxia ([place]) (09/22/2018), Anxiety, Arthritis, Chronic kidney disease, Community acquired pneumonia (11/28/2018), Diabetes mellitus without complication ([place]), Dysrhythmia, Edentulous, Epilepsy ([place]) (in 1972), Gastric varices with bleeding, GERD (gastroesophageal reflux disease) (2008), Head injury, Headache(784.0), Heart murmur, 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      Significant Hospital Events   1/11 admit  1/13 - cough much better and nearly gone after scheduled tussionex. HRCT without change in ILD (prob UIP) since July 2020 but has new nodular density rUL.Marland Kitchen She says she walked with PT and did wll on 2L [place] o2    Consults:  PCCM 1/12 >  Procedures:    Significant Diagnostic Tests:   Micro Data:  SARS-COV 2 1/11 swab  > neg Blood Cx 1/11 >  Antimicrobials:  CTX 1/11 >1/12 Azithromycin 1/11 >1/2  Interim history/subjective:   05/25/2019  - cough still improved with tussionex. Pall care and sister and patient in meeting - I joined . She wants to pursue active Rx and concurrent palliation to extent possible. Feels hospice might be a bit too premature. I reviewed disease and she formally meets criteria for IPAF - so have labeled her as IPAF. She consented for [facility] registry study today. No pharma itnevention. PFT today shows continued decline  Says sister will be #1 HCPOA   Results for Holly, [name] (MRN [id number]) as of 05/25/2019 15:58  Ref. Range 11/13/2016 11:21 07/12/2018 13:58 01/11/2019 10:08 05/24/2019 13:14  FVC-Pre  Latest Units: L 1.80 1.87 1.88 1.74  FVC-%Pred-Pre Latest Units: % 48 50 49 47  Results for Holly, [name] (MRN [id number]) as of 05/25/2019 15:58  Ref. Range 11/13/2016 11:21 07/12/2018 13:58 01/11/2019 10:08 05/24/2019 13:14  DLCO unc Latest Units: ml/min/mmHg  14.68 11.45 9.91  DLCO unc % pred Latest Units: %  66 50 45    Objective   Blood pressure 125/68, pulse 87, temperature 97.6 F (36.4 C), temperature source Oral, resp. rate 19, height 5' 5.5" (1.664 m), weight 109.2 kg, SpO2 97 %.        Intake/Output Summary (Last 24 hours) at 05/25/2019 1615 Last data filed at 05/25/2019 0900 Gross per 24 hour  Intake 960 ml  Output --  Net 960 ml   Filed Weights   05/24/19 1756  Weight: 109.2 kg      obse More cheerful 2L o2 Basal crackles     LABS    PULMONARY No results for input(s): PHART,  PCO2ART, PO2ART, HCO3, TCO2, O2SAT in the last 168 hours.  Invalid input(s): PCO2, PO2  CBC Recent Labs  Lab 05/23/19 0437 05/24/19 0308 05/25/19 0224  HGB 9.8* 9.1* 9.6*  HCT 31.9* 28.1* 30.9*  WBC 1.1* 1.9* 1.5*  PLT 60* 67* 63*    COAGULATION No results for input(s): INR in the last 168 hours.  CARDIAC  No results for input(s): TROPONINI in the last 168 hours. No results for input(s): PROBNP in the last 168 hours.   CHEMISTRY Recent Labs  Lab 05/22/19 1245 05/22/19 1245 05/23/19 0437 05/24/19 0308 05/25/19 0224  NA 136  --  131* 135  --   K 3.7   < > 4.2 3.5  --   CL 108  --  105 106  --   CO2 22  --  19* 21*  --   GLUCOSE 297*  --  508* 201*  --   BUN 9  --  12 14  --   CREATININE 0.66  --  0.85 0.70  --   CALCIUM 8.7*  --  8.3* 8.2*  --   MG  --   --   --   --  1.9  PHOS  --   --   --   --  3.4   < > = values in this interval not displayed.   Estimated Creatinine Clearance: 99.8 mL/min (by C-G formula based on SCr of 0.7 mg/dL).   LIVER Recent Labs  Lab 05/23/19 0437 05/24/19 0308  AST 47* 28  ALT 35 29  ALKPHOS 136* 113  BILITOT  2.5* 1.4*  PROT 7.0 6.4*  ALBUMIN 2.5* 2.4*     INFECTIOUS Recent Labs  Lab 05/24/19 1403 05/25/19 0224  PROCALCITON <0.10 <0.10     ENDOCRINE CBG (last 3)  Recent Labs    05/24/19 2034 05/25/19 0744 05/25/19 1149  GLUCAP 215* 123* 167*         IMAGING x48h  - image(s) personally visualized  -   highlighted in bold CT Chest High Resolution  Result Date: 05/24/2019 CLINICAL DATA:  54 year old female with history of acute respiratory failure with hypoxia. Evaluate for interstitial lung disease. EXAM: CT CHEST WITHOUT CONTRAST TECHNIQUE: Multidetector CT imaging of the chest was performed following the standard protocol without intravenous contrast. High resolution imaging of the lungs, as well as inspiratory and expiratory imaging, was performed. COMPARISON:  Chest CT 12/09/2018. FINDINGS: Cardiovascular: Heart size is mildly enlarged. There is no significant pericardial fluid, thickening or pericardial calcification. Aortic atherosclerosis. No definite coronary artery calcifications. Mild calcifications of the aortic valve. Mediastinum/Nodes: No pathologically enlarged mediastinal or hilar lymph nodes. Please note that accurate exclusion of hilar adenopathy is limited on noncontrast CT scans. Esophagus is unremarkable in appearance. No axillary lymphadenopathy. Lungs/Pleura: In the periphery of the right upper lobe near the apex (axial image 24 of series 11) there is a new well-defined 2.7 x 1.4 cm area of ground-glass attenuation. High-resolution images otherwise demonstrate patchy areas of peripheral predominant ground-glass attenuation, septal thickening, regional architectural distortion, scattered regions of mild cylindrical bronchiectasis and peripheral bronchiolectasis. No frank honeycombing. These findings appear similar to the prior study (although direct comparison is limited by extensive respiratory motion on the prior study) and have a mild craniocaudal gradient.  Inspiratory and expiratory imaging is unremarkable. No pleural effusions. Upper Abdomen: Liver has a shrunken appearance and nodular contour, suggesting underlying cirrhosis. Mild diffuse low attenuation throughout the visualized hepatic parenchyma, indicative of hepatic steatosis. Spleen is incompletely  imaged, but appears enlarged measuring at least 14.7 cm on axial images. Postoperative changes in the proximal stomach incompletely imaged. Musculoskeletal: There are no aggressive appearing lytic or blastic lesions noted in the visualized portions of the skeleton. IMPRESSION: 1. Similar appearance of the lungs with a spectrum of findings considered probable usual interstitial pneumonia (UIP) per current ats guidelines. 2. New nodular area of ground-glass attenuation in the periphery of the right upper lobe. This could be of infectious or inflammatory etiology, or could represent an area of active inflammation and developing fibrosis. Attention on follow-up high-resolution chest CT is recommended in 6-12 months. 3. Aortic atherosclerosis. 4. There are calcifications of the aortic valve. Echocardiographic correlation for evaluation of potential valvular dysfunction may be warranted if clinically indicated. 5. Hepatic steatosis. Aortic Atherosclerosis (ICD10-I70.0). Electronically Signed   By: Vinnie Langton M.D.   On: 05/24/2019 08:16    ASSESSMENT/PLAN  #IL:D - probably UIP on HRCT. Formally classifed her as IPAF (Idiopathich ILD with autpimmune features but without overt CTD)  - course: progressive  - severity : severe /moderate - needing 2L [place] at rest (progression is mostly on PFT)  Plan  - avoid prednisone due to hyperglyucemiua - can revisit as opd  - avoid anti-fbirotics due to liver cirrhosis but given goals will re-discuss goals and risks in the office  - leaning towards Esbreit/Ofev with close monitoring of liver and accepting potential Drug INduced Liver Injruty  - avoid immune suppressants  due to panctyopenia - no role clinical trial but has enrolled in reegistry - await ANA, DSDNA, C3 and C4  #RUL density  ->? Cause//NEw  - RVP/Covid, urine strep/leg - and Procal - all negative  Plan  - montior off abx - need followup CT in 6 months  #Cough - maintly due to ILD  Plan  - tussionex  - consider home palliative care  (does not need hospice as of now) for symptom control     Followup Future Appointments  Date Time Provider [place]  06/01/2019  1:00 PM Orson Slick, MD [facility] None  06/01/2019  2:15 PM [facility] LAB 6 [facility] None  06/30/2019 11:00 AM [facility] PFT ROOM [facility] None  07/04/2019 11:00 AM Brand Males, MD [facility] None  07/05/2019 11:10 AM Gatha Mayer, MD [facility]   Ccm will sign off     SIGNATURE    Dr. Brand Males, M.D., F.C.C.P,  Pulmonary and Critical Care Medicine Staff Physician, [place] Director - Interstitial Lung Disease  Program  Pulmonary [place] at [place], [place], [address]  Pager: [phone], If no answer or between  15:00h - 7:00h: call [phone] Telephone: [phone]  4:15 PM 05/25/2019

## 2019-05-25 NOTE — Progress Notes (Signed)
Family Medicine Teaching Service Daily Progress Note Intern Pager: [phone]  Patient name: Holly Mueller Medical record number: [id number] Date of birth: 10/28/1965 Age: 54 y.o. Gender: female  Primary Care Provider: Cleophas Dunker, DO Consultants: None  Code Status: Full Code   Pt Overview and Major Events to Date:  1/11: admitted with worsening SOB, Azithro & CTX, and Decadron started   Assessment and Plan: Holly Mueller is a 54 y.o. female presenting with worsening productive cough and SOB. PMH is significant for  primary biliary cholangitis,pancytopenia, ILD, hypothyroidism, GERD, T2DM and cirrhosis  Acute on Chronic Hypoxic Respiratory Failure likely due to exacerbation of ILD   Patient states that her cough remains about the same but her SOB has improved some.  Overnight, patient was stable on 2 L via [place] with sats 95%.  On exam, patient continues to have bilateral crackles from mid to basilar lung fields.  WBC remain chronically low.  Negative results of influenza testing.   -palliative meeting scheduled with patient and sibling to discuss [place] and help with treatment planning for pulmonology from there as well  -continue Tussionex 42m BID for cough  -continue droplet and contact precaution   -continue Dulera -f/u blood cultures, NGTD -continue Ipratropium, albuterol, tessalon perles  -vitals per floor  -Pt/OT with no further recs -PFTs -Tylenol PRN for pain   ILD: Acute on Chronic  -Inhaler regimen as above, holding home Spiriva while on short acting ipratropium -Encourage close follow-up with pulmonary, already scheduled to do spirometry/DLCO in the next several weeks -Pulm following with goals for palliative GOC discussion with family and tussionex no prednisone due to hyperglycemia   Pancytopenia: Chronic, stable. WBC 1.6>1.1>1.9, hemoglobin 10.1>9.8>9.1, PLT 67.  This appears to be around her baseline.  Follows with hematology/oncology, had a bone  marrow biopsy in 11/2016 that was nonspecific.  Felt to be secondary to her liver cirrhosis. -AM CBC with differential -Recommend follow-up with heme/oncology upon discharge   Chest pain: Acute, stable. Suspect musculoskeletal in nature due to sharp quality and related with frequent coughing over the past week.  Doubt ACS with reassuringly high-sensitivity troponin negative X2 with no suspicious EKG findings.  Will provide topical pain relief and cough suppression therapy to mitigate frequent coughing.  Primary Biliary Cholangitis  Hepatic Cirrhosis   Patient on home actigall. CMP with mildly elevated AST of 47 and Total bilirubin of 2.5.  -Actigall 500 TID   T2DM: Patient home medications include Metformin.  BG overnight 188-210, given 10 units of insulin.  Most recent A1c 5.5, likely falsely decreased in the setting of hematological abnormalities.   -CBG four times daily  -moderate SSI - basal insulin at 10 units Lantus   Hyponatremia, resolved  Na 131 on CMP, corrects to 138 in setting of hyperglycemia. Will manage hyperglycemia as detailed above.   Hypothyroidism Continue patient's home Synthroid 250 mcg daily  GERD: Continue Protonix 40  FEN/GI: carb modified  PPx: Lovenox   Disposition: discharge pending continued medical evaluation   Subjective:  Patient reports that she continues to have SOB with exertion, cough has persisted. Patient feels that she is slowly improving.   Objective: Temp:  [97.8 F (36.6 C)-98 F (36.7 C)] 97.8 F (36.6 C) (01/14 0500) Pulse Rate:  [78-92] 89 (01/14 0855) Resp:  [18-21] 21 (01/14 0855) BP: (102-133)/(41-81) 102/41 (01/14 0500) SpO2:  [95 %-97 %] 95 % (01/14 0855) Weight:  [109.2 kg] 109.2 kg (01/13 1756)  Physical Exam: General: female sitting up in  bed in NAD, appears winded after walking back from restroom, patient put  back on while in room  Cardiovascular: hyperdynamic heart sounds, no friction rub, bilateral radial  pulses palpated Respiratory: patient continues to have crackles bilaterally, no wheezing, aeration improved from previous exam  Abdomen: abdomen is soft without tenderness to palpation, bowel sounds appreciated  Extremities: trace LE edema   Laboratory: Recent Labs  Lab 05/23/19 0437 05/24/19 0308 05/25/19 0224  WBC 1.1* 1.9* 1.5*  HGB 9.8* 9.1* 9.6*  HCT 31.9* 28.1* 30.9*  PLT 60* 67* 63*   Recent Labs  Lab 05/22/19 1245 05/23/19 0437 05/24/19 0308  NA 136 131* 135  K 3.7 4.2 3.5  CL 108 105 106  CO2 22 19* 21*  BUN _0 CREATININE 0.66 0.85 0.70  CALCIUM 8.7* 8.3* 8.2*  PROT  --  7.0 6.4*  BILITOT  --  2.5* 1.4*  ALKPHOS  --  136* 113  ALT  --  35 29  AST  --  47* 28  GLUCOSE 297* 508* 201*    Imaging/Diagnostic Tests: No results found.   Stark Klein, MD 05/25/2019, 12:21 PM PGY-1, [place] Intern pager: [phone], text pages welcome

## 2019-05-25 NOTE — Plan of Care (Signed)

## 2019-05-25 NOTE — Research (Signed)
Title: Chronic Fibrosing Interstitial Lung Disease with Progressive Phenotype Prospective Outcomes (ILD-PRO) Registry   Protocol #: IPF-PRO-SUB, Clinical Trials # [id number], Sponsor: [employer]  Protocol Version Amendment 4 dated 12Sep2019  and confirmed current on 05/25/2019 Consent Version for today's visit date of 05/25/2019  is Version Amendment 4 ([id number]).  Objectives:  Marland Kitchen Describe current approaches to diagnosis and treatment of chronic fibrosing ILDs with progressive phenotype  . Describe the natural history of chronic fibrosing ILDs with progressive phenotype  . Assess quality of life from self-administered participant reported questionnaires for each disease group  . Describe participant interactions with the healthcare system, describe treatment practices across multiple institutions for each disease group  . Collect biological samples linked to well characterized chronic fibrosing ILDs with progressive phenotype to identify disease biomarkers  . Collect data and biological samples that will support future research studies.                                            Key Inclusion Criteria: Willing and able to provide informed consent  Age ? 30 years  Diagnosis of a non-IPF ILD of any duration, including, but not limited to Idiopathic Non-Specific Interstitial Pneumonia (INSIP), Unclassifiable Idiopathic Interstitial Pneumonias (IIPs), Interstitial Pneumonia with Autoimmune Features (IPAF), Autoimmune ILDs such as Rheumatoid Arthritis (RA-ILD) and Systemic Sclerosis (SSC-ILD), Chronic Hypersensitivity Pneumonitis (HP), Sarcoidosis or Exposure-related ILDs such as asbestosis.  Chronic fibrosing ILD defined by reticular abnormality with traction bronchiectasis with or without honeycombing confirmed by chest HRCT scan and/or lung biopsy.  Progressive phenotype as defined by fulfilling at least one of the criteria below of fibrotic changes (progression set point)  within the last 24 months regardless of treatment considered appropriate in individual ILDs:  . decline in FVC % predicted (% pred) based on >10% relative decline  . decline in FVC % pred based on ? 5 - <10% relative decline in FVC combined with worsening of respiratory symptoms as assessed by the site investigator  . decline in FVC % pred based on ? 5 - <10% relative decline in FVC combined with increasing extent of fibrotic changes on chest imaging (HRCT scan) as assessed by the site investigator  . decline in DLCO % pred based on ? 10% relative decline  . worsening of respiratory symptoms as well as increasing extent of fibrotic changes on chest imaging (HRCT scan) as assessed by the site investigator independent of FVC change.    Key Exclusion Criteria: Malignancy, treated or untreated, other than skin or early stage prostate cancer, within the past 5 years  Currently listed for lung transplantation at the time of enrollment  Currently enrolled in a clinical trial at the time of enrollment in this registry    Clinical Research Coordinator / Research RN note : This visit for Subject Holly Mueller with DOB: 02/28/1966 on 05/25/2019 for the above protocol is Visit/Encounter # Baseline/enrollment and is for purpose of research. The consent for this encounter is under Protocol Version Amendment 4 ([id number]) and IS currently IRB approved. Subject confirmed that there was NO change in contact information (e.g. address, telephone, email). Subject thanked for participation in research and contribution to science.   Duringthis visiton01/14/2021,I met with the subject at [facility] to discuss the protocol ICF and sign. The study coordinator went over the entire ICF with the subject and the subject was given ample  time to read and review the ICF. After review of the ICF,all questions were answered to the subject's satisfaction.Dr. Linwood Dibbles the ICF with the patient, explained the  purpose of research, and asked if there were any additional questions. Dr. Chase Caller was unable to be physically present at the time of consent due to [place] staffing requirements but was able to sign the consent after. She stated that thestudycoordinatorand doctorhave explained the study to her thoroughly and stated she had no additional questions. The subject,study coordinator,and PI signed the ICF. After consent all study related procedures were conducted as per theabovestated protocol. For additional information on today's visit,please refer to subject's paper source binder.  Signed by  Verline Lema Research Assistant [facility]  [place], [place] 11:32 AM 05/25/2019

## 2019-05-25 NOTE — Progress Notes (Signed)
Responded to unit page to assist patient with living will.  Patient was being visited by staff and having consultation.  Staff gave her AD document.  Nurse will have chaplain paged if needed .  Chaplain will follow as needed.   Jaclynn Major, [place], [facility], Pager [phone]

## 2019-05-26 LAB — GLUCOSE, CAPILLARY
Glucose-Capillary: 124 mg/dL — ABNORMAL HIGH (ref 70–99)
Glucose-Capillary: 140 mg/dL — ABNORMAL HIGH (ref 70–99)
Glucose-Capillary: 133 mg/dL — ABNORMAL HIGH (ref 70–99)
Glucose-Capillary: 126 mg/dL — ABNORMAL HIGH (ref 70–99)
Glucose-Capillary: 123 mg/dL — ABNORMAL HIGH (ref 70–99)

## 2019-05-26 LAB — BASIC METABOLIC PANEL
Anion gap: 7 (ref 5–15)
BUN: 11 mg/dL (ref 6–20)
CO2: 24 mmol/L (ref 22–32)
Calcium: 8.3 mg/dL — ABNORMAL LOW (ref 8.9–10.3)
Chloride: 105 mmol/L (ref 98–111)
Creatinine, Ser: 0.64 mg/dL (ref 0.44–1.00)
GFR calc Af Amer: >60 mL/min (ref >60)
GFR calc non Af Amer: >60 mL/min (ref >60)
Glucose, Bld: 172 mg/dL — ABNORMAL HIGH (ref 70–99)
Potassium: 3.9 mmol/L (ref 3.5–5.1)
Sodium: 136 mmol/L (ref 135–145)

## 2019-05-26 LAB — CBC WITH DIFFERENTIAL/PLATELET
Abs Immature Granulocytes: 0.02 10*3/uL (ref 0.00–0.07)
Basophils Absolute: 0.1 10*3/uL (ref 0.0–0.1)
Basophils Relative: 3 %
Eosinophils Absolute: 0.1 10*3/uL (ref 0.0–0.5)
Eosinophils Relative: 7 %
HCT: 27.8 % — ABNORMAL LOW (ref 36.0–46.0)
Hemoglobin: 8.9 g/dL — ABNORMAL LOW (ref 12.0–15.0)
Immature Granulocytes: 1 %
Lymphocytes Relative: 23 %
Lymphs Abs: 0.4 10*3/uL — ABNORMAL LOW (ref 0.7–4.0)
MCH: 27.5 pg (ref 26.0–34.0)
MCHC: 32 g/dL (ref 30.0–36.0)
MCV: 85.8 fL (ref 80.0–100.0)
Monocytes Absolute: 0.3 10*3/uL (ref 0.1–1.0)
Monocytes Relative: 20 %
Neutro Abs: 0.7 10*3/uL — ABNORMAL LOW (ref 1.7–7.7)
Neutrophils Relative %: 46 %
Platelets: 62 10*3/uL — ABNORMAL LOW (ref 150–400)
RBC: 3.24 MIL/uL — ABNORMAL LOW (ref 3.87–5.11)
RDW: 14.6 % (ref 11.5–15.5)
WBC: 1.5 10*3/uL — ABNORMAL LOW (ref 4.0–10.5)
nRBC: 0 % (ref 0.0–0.2)

## 2019-05-26 LAB — PROCALCITONIN: Procalcitonin: <0.1 ng/mL

## 2019-05-26 LAB — MAGNESIUM: Magnesium: 2.1 mg/dL (ref 1.7–2.4)

## 2019-05-26 LAB — ANTINUCLEAR ANTIBODIES, IFA: ANA Ab, IFA: NEGATIVE

## 2019-05-26 LAB — PHOSPHORUS: Phosphorus: 4 mg/dL (ref 2.5–4.6)

## 2019-05-26 NOTE — Plan of Care (Signed)
  Problem: Education: Goal: Knowledge of General Education information will improve Description: Including pain rating scale, medication(s)/side effects and non-pharmacologic comfort measures Outcome: Progressing   Problem: Health Behavior/Discharge Planning: Goal: Ability to manage health-related needs will improve Outcome: Progressing   Problem: Clinical Measurements: Goal: Ability to maintain clinical measurements within normal limits will improve Outcome: Progressing Goal: Respiratory complications will improve Outcome: Progressing   Problem: Activity: Goal: Risk for activity intolerance will decrease Outcome: Progressing   Problem: Nutrition: Goal: Adequate nutrition will be maintained Outcome: Progressing   Problem: Elimination: Goal: Will not experience complications related to bowel motility Outcome: Progressing Goal: Will not experience complications related to urinary retention Outcome: Progressing   Problem: Pain Managment: Goal: General experience of comfort will improve Outcome: Progressing   Problem: Safety: Goal: Ability to remain free from injury will improve Outcome: Progressing   Problem: Skin Integrity: Goal: Risk for impaired skin integrity will decrease Outcome: Progressing

## 2019-05-26 NOTE — TOC Initial Note (Addendum)
Transition of Care [facility]) - Initial/Assessment Note    Patient Details  Name: Holly Mueller MRN: [id number] Date of Birth: [birth year]  Transition [facility] Care [facility]) CM/SW Contact:    Ella Bodo, RN Phone Number: 05/26/2019, 4:46 PM  Clinical Narrative:  Patient is a 54 y/o female admitted due to SOB, cough, aches, negative for Covid.  PMH positive for primary biliary colangitis, hepatic cirrhosis, ILD, GERD, DM, hypothyroidism and pancytopenia. PTA, pt independent, lives at home with brother and sister.  She is on home oxygen at 2L/[place], provided by [employer].  Met with pt to discuss referral for palliative services in the home, and she is agreeable to this.  She would like to use [employer] for these services.  Referral called in to [facility] with Palliative Care agency; they will follow up with her in the home.  Pt requests shower chair for home use; referral to [place] for DME needs.                  Expected Discharge Plan: Home w Hospice Care Barriers to Discharge: Continued Medical Work up   Patient Goals and CMS Choice   CMS Medicare.gov Compare Post Acute Care list provided to:: Patient Choice offered to / list presented to : Patient  Expected Discharge Plan and Services Expected Discharge Plan: [place]   Discharge Planning Services: CM Consult Post Acute Care Choice: Hospice(Palliative Services) Living arrangements for the past 2 months: Single Family Home                 DME Arranged: Shower stool DME Agency: [employer] Date DME Agency Contacted: 05/26/19 Time DME Agency Contacted: [phone] Representative spoke with at DME Agency: Andree Coss            Prior Living Arrangements/Services Living arrangements for the past 2 months: [place] with:: Siblings Patient language and need for interpreter reviewed:: Yes Do you feel safe going back to the place where you live?: Yes      Need for Family Participation in Patient Care:  Yes (Comment) Care giver support system in place?: Yes (comment)   Criminal Activity/Legal Involvement Pertinent to Current Situation/Hospitalization: No - Comment as needed  Activities of Daily Living Home Assistive Devices/Equipment: Eyeglasses, Oxygen(2L [place]) ADL Screening (condition at time of admission) Patient's cognitive ability adequate to safely complete daily activities?: Yes Is the patient deaf or have difficulty hearing?: No Does the patient have difficulty seeing, even when wearing glasses/contacts?: No Does the patient have difficulty concentrating, remembering, or making decisions?: No Patient able to express need for assistance with ADLs?: Yes Does the patient have difficulty dressing or bathing?: No Independently performs ADLs?: Yes (appropriate for developmental age) Does the patient have difficulty walking or climbing stairs?: Yes Weakness of Legs: Both Weakness of Arms/Hands: None  Permission Sought/Granted   Permission granted to share information with : Yes, Verbal Permission Granted     Permission granted to share info w AGENCY: [facility]        Emotional Assessment Appearance:: Appears older than stated age Attitude/Demeanor/Rapport: Gracious Affect (typically observed): Accepting Orientation: : Oriented to Self, Oriented to Place, Oriented to  Time, Oriented to Situation      Admission diagnosis:  Pneumonia [J18.9] Respiratory distress [R06.03] Community acquired pneumonia of left lower lobe of lung [J18.9] Patient Active Problem List   Diagnosis Date Noted  . On supplemental oxygen therapy   . Nodule of right lung   .  Respiratory distress   . Community acquired pneumonia of left lower lobe of lung 05/22/2019  . Petechiae 05/01/2019  . Lower extremity edema 05/01/2019  . Irritant contact dermatitis due to detergent 03/31/2019  . Melena 03/31/2019  . Financial difficulties 03/31/2019  . Skin rash 03/17/2019  . Intertrigo  02/11/2019  . Dysuria 02/11/2019  . Chronic respiratory failure with hypoxia ([place]) 01/11/2019  . Inadequate housing 12/05/2018  . Primary biliary cholangitis ([place]) 10/24/2018  . Gastric varices with bleeding   . Anemia 09/22/2018  . Pancytopenia ([place]) 08/28/2018  . Does not have health insurance 04/04/2018  . ILD (interstitial lung disease) ([place]) 03/23/2018  . Type 2 diabetes mellitus without complication, without long-term current use of insulin ([place])   . 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 GERD (gastroesophageal reflux disease) 10/23/2016  . Chronic cough 12/09/2015  . Precordial chest pain 08/03/2015  . DOE (dyspnea on exertion) 08/17/2013  . Ganglion cyst of wrist 08/24/2012  . Right hip pain 08/23/2012  . Complex care coordination 02/14/2012  . Obesity 07/08/2010  . Shortness of breath 12/12/2009  . Depression 07/26/2008  . Hypothyroidism 10/12/2006  . Restrictive lung disease 07/30/2006   PCP:  Cleophas Dunker, DO Pharmacy:   [place], [place] - [address] [facility]. [address] [place] [address] Phone: [phone] Fax: [phone]     Social Determinants of Health (SDOH) Interventions    Readmission Risk Interventions Readmission Risk Prevention Plan 11/24/2018  Transportation Screening Complete  PCP or Specialist Appt within 3-5 Days Complete  HRI or [place] Complete  Social Work Consult for [place] Planning/Counseling Complete  Palliative Care Screening Not Applicable  Medication Review (RN Care Manager) Complete  Some recent data might be hidden   Reinaldo Raddle, RN, BSN  Trauma/Neuro ICU Case Manager [phone]

## 2019-05-26 NOTE — Progress Notes (Signed)
Family Medicine Teaching Service Daily Progress Note Intern Pager: [phone]  Patient name: Holly Mueller Medical record number: [id number] Date of birth: October 25, 1965 Age: 54 y.o. Gender: female  Primary Care Provider: Cleophas Dunker, DO Consultants: None  Code Status: Full Code   Pt Overview and Major Events to Date:  1/11: admitted with worsening SOB, Azithro & CTX, and Decadron started   Assessment and Plan: [name] is a 54 y.o. female presenting with worsening productive cough and SOB. PMH is significant for  primary biliary cholangitis,pancytopenia, ILD, hypothyroidism, GERD, T2DM and cirrhosis  Acute on Chronic Hypoxic Respiratory Failure likely due to exacerbation of ILD   Patient states that her cough and SOB have improved. Overnight, patient was stable on 2 L via [place] with sats 95%.  On exam, patient continues to have bilateral crackles from mid to basilar lung fields.  WBC remain chronically low.  Negative results of influenza testing.   -palliative meeting with patient and sister, recs appreciated  -patient referred for outpatient palliative care   -continue Tussionex 10m BID for cough  -continue droplet and contact precaution   -continue Dulera -f/u blood cultures, NGTD -continue Ipratropium, albuterol, tessalon perles  -vitals per floor  -Pt/OT with no further recs -PFTs completed   ILD: Acute on Chronic  -Inhaler regimen as above, holding home Spiriva while on short acting ipratropium -Encourage close follow-up with pulmonary, already scheduled to do spirometry/DLCO in the next several weeks -Pulm following with goals for palliative GOC discussion with family and tussionex no prednisone due to hyperglycemia   Pancytopenia: Chronic, stable. WBC 1.6>1.1>1.9, hemoglobin 10.1>9.8>9.1, PLT 67.  This appears to be around her baseline.  Follows with hematology/oncology, had a bone marrow biopsy in 11/2016 that was nonspecific.  Felt to be secondary to  her liver cirrhosis. -AM CBC with differential -Recommend follow-up with heme/oncology upon discharge   Chest pain: Acute, stable. Suspect musculoskeletal in nature due to sharp quality and related with frequent coughing over the past week.  Doubt ACS with reassuringly high-sensitivity troponin negative X2 with no suspicious EKG findings.  Will provide topical pain relief and cough suppression therapy to mitigate frequent coughing.  Primary Biliary Cholangitis  Hepatic Cirrhosis   Patient on home actigall. CMP with mildly elevated AST of 47 and Total bilirubin of 2.5.  -Actigall 500 TID   T2DM: Patient home medications include Metformin.  BG overnight 188-210, given 10 units of insulin.  Most recent A1c 5.5, likely falsely decreased in the setting of hematological abnormalities.   -CBG four times daily  -moderate SSI - basal insulin at 10 units Lantus   Hyponatremia, resolved  Na 131 on CMP, corrects to 138 in setting of hyperglycemia. Will manage hyperglycemia as detailed above.   Hypothyroidism Continue patient's home Synthroid 250 mcg daily  GERD: Continue Protonix 40  FEN/GI: carb modified  PPx: Lovenox   Disposition: discharge anticipated later today or 1/16 AM pending clearance from pulmonary   Subjective:  Patient reports her new cough medicine regimen has improved her cough greatly. She states that she feels somewhat better in regards to her breathing at rest but still has SOB without oxygen during exertion.    Objective: Temp:  [97.6 F (36.4 C)-98.6 F (37 C)] 98.1 F (36.7 C) (01/15 0421) Pulse Rate:  [80-87] 81 (01/15 0849) Resp:  [16-20] 16 (01/15 0849) BP: (107-125)/(65-68) 119/67 (01/15 0421) SpO2:  [95 %-97 %] 95 % (01/15 0849)  Physical Exam: General: female sitting up in bed in  NAD, with nasal canula in place  Cardiovascular: hyperdynamic heart sounds, no friction rub, bilateral radial pulses palpated Respiratory: patient continues to have  crackles bilaterally, no wheezing, basilar crackles Abdomen: abdomen is soft without tenderness to palpation, bowel sounds appreciated  Extremities: trace LE edema   Laboratory: Recent Labs  Lab 05/24/19 0308 05/25/19 0224 05/26/19 0403  WBC 1.9* 1.5* 1.5*  HGB 9.1* 9.6* 8.9*  HCT 28.1* 30.9* 27.8*  PLT 67* 63* 62*   Recent Labs  Lab 05/23/19 0437 05/24/19 0308 05/26/19 0403  NA 131* 135 136  K 4.2 3.5 3.9  CL 105 106 105  CO2 19* 21* 24  BUN _0 CREATININE 0.85 0.70 0.64  CALCIUM 8.3* 8.2* 8.3*  PROT 7.0 6.4*  --   BILITOT 2.5* 1.4*  --   ALKPHOS 136* 113  --   ALT 35 29  --   AST 47* 28  --   GLUCOSE 508* 201* 172*    Imaging/Diagnostic Tests: No results found.   Stark Klein, MD 05/26/2019, 2:43 PM PGY-1, [place] Intern pager: [phone], text pages welcome

## 2019-05-27 LAB — GLUCOSE, CAPILLARY
Glucose-Capillary: 122 mg/dL — ABNORMAL HIGH (ref 70–99)
Glucose-Capillary: 161 mg/dL — ABNORMAL HIGH (ref 70–99)

## 2019-05-27 LAB — CULTURE, BLOOD (ROUTINE X 2)
Culture: NO GROWTH
Culture: NO GROWTH
Special Requests: ADEQUATE

## 2019-05-27 LAB — PHOSPHORUS: Phosphorus: 4 mg/dL (ref 2.5–4.6)

## 2019-05-27 LAB — MAGNESIUM: Magnesium: 1.9 mg/dL (ref 1.7–2.4)

## 2019-05-27 MED ORDER — URSODIOL 300 MG PO CAPS: 600.0000 mg | ORAL_CAPSULE | Freq: Three times a day (TID) | ORAL | 0 refills | Status: AC

## 2019-05-27 MED ORDER — URSODIOL 300 MG PO CAPS: 600.0000 mg | ORAL_CAPSULE | Freq: Three times a day (TID) | ORAL | 0 refills | Status: DC

## 2019-05-27 MED ORDER — DICLOFENAC SODIUM 1 % EX GEL: 4.0000 g | Freq: Four times a day (QID) | CUTANEOUS | 0 refills | Status: AC | PRN

## 2019-05-27 MED ORDER — APAP 325 MG PO TABS: 650.0000 mg | ORAL_TABLET | Freq: Four times a day (QID) | ORAL | Status: DC | PRN

## 2019-05-27 MED ORDER — HYDROCOD POLST-CPM POLST ER 10-8 MG/5ML PO SUER: 5.0000 mL | Freq: Two times a day (BID) | ORAL | 0 refills | Status: DC

## 2019-05-27 MED ORDER — APAP 325 MG PO TABS: 650.0000 mg | ORAL_TABLET | Freq: Four times a day (QID) | ORAL | Status: AC | PRN

## 2019-05-27 MED ORDER — DICLOFENAC SODIUM 1 % EX GEL: 4.0000 g | Freq: Four times a day (QID) | CUTANEOUS | 0 refills | Status: DC | PRN

## 2019-05-27 NOTE — Discharge Instructions (Signed)
You are admitted to the hospital with an interstitial lung disease flare.  You have close follow-up with pulmonology.  Please keep your appointment.  You also have ongoing discussions with palliative care to assist you with ongoing goals of care and home health needs.  Please continue to follow with him as well.  We have scheduled appointment with the family medicine clinic just to touch base with you and ensure that the transition of the hospital has been complete.  Please feel free to call the clinic at [phone] if you have any other questions.

## 2019-05-27 NOTE — Progress Notes (Signed)
Family Medicine Teaching Service Daily Progress Note Intern Pager: [phone]  Patient name: Holly Mueller Medical record number: [id number] Date of birth: 12-Aug-1965 Age: 54 y.o. Gender: female  Primary Care Provider: Cleophas Dunker, DO Consultants: None  Code Status: Full Code   Pt Overview and Major Events to Date:  1/11: admitted with worsening SOB, Azithro & CTX, and Decadron started   Assessment and Plan: Holly Mueller is a 54 y.o. female presenting with worsening productive cough and SOB. PMH is significant for  primary biliary cholangitis,pancytopenia, ILD, hypothyroidism, GERD, T2DM and cirrhosis  ILD Exacerbation  End Stage Lung Disease Patient stable on home O2. Followed closely by pulmonology with continued work up and treatment in the outpatient setting. This has already been arranged. Patient met with palliative, also following. Patient is going home with home hospice. SW/CM has arranged this. Plan for discharge home today.   FEN/GI: carb modified  PPx: Lovenox   Disposition: Home with Home hospice today  Subjective:  Patient with no complaints. Wants to go home.   Objective: Temp:  [97.8 F (36.6 C)-98.5 F (36.9 C)] 98.5 F (36.9 C) (01/16 0939) Pulse Rate:  [83-106] 92 (01/16 0939) Resp:  [16] 16 (01/16 0939) BP: (117-138)/(58-82) 126/58 (01/16 0939) SpO2:  [87 %-97 %] 94 % (01/16 0939)  Physical Exam: General: female sitting up in bed in NAD, with nasal canula in place  Cardiovascular: hyperdynamic heart sounds, no friction rub, bilateral radial pulses palpated Respiratory: patient continues to have crackles bilaterally, no wheezing, basilar crackles Abdomen: abdomen is soft without tenderness to palpation, bowel sounds appreciated  Extremities: trace LE edema   Laboratory: Recent Labs  Lab 05/24/19 0308 05/25/19 0224 05/26/19 0403  WBC 1.9* 1.5* 1.5*  HGB 9.1* 9.6* 8.9*  HCT 28.1* 30.9* 27.8*  PLT 67* 63* 62*   Recent Labs   Lab 05/23/19 0437 05/24/19 0308 05/26/19 0403  NA 131* 135 136  K 4.2 3.5 3.9  CL 105 106 105  CO2 19* 21* 24  BUN _0 CREATININE 0.85 0.70 0.64  CALCIUM 8.3* 8.2* 8.3*  PROT 7.0 6.4*  --   BILITOT 2.5* 1.4*  --   ALKPHOS 136* 113  --   ALT 35 29  --   AST 47* 28  --   GLUCOSE 508* 201* 172*    Imaging/Diagnostic Tests: No results found.   Bonnita Hollow, MD 05/27/2019, 9:46 AM PGY-3, [place] Intern pager: [phone], text pages welcome

## 2019-05-27 NOTE — Care Management (Signed)
Spoke w patient over the phone, she states that she has her DME to go home with and sister is in her room to pick her up and she is ready to leave. She declined my offer to get her oxygen for transport because she did not want to wait for it. I suggested since she said she lived 10 minutes away that her sister run back home to get a tank, but she decided that she didn't wan to do that.

## 2019-05-29 NOTE — Progress Notes (Signed)
[place] [phone]) [phone]   Fax:([phone]) [place] NOTE  Patient Care Team: Cleophas Dunker, DO as PCP - General (Family Medicine) Croitoru, Dani Gobble, MD as PCP - Cardiology (Cardiology) Lazaro Arms, RN as [facility]  Hematological/Oncological History #Pancytopenia 1) 06/17/2012: WBC 4.4, Hgb 12.7, Plt 127 (earliest evidence of thrombocytopenia) 2) 02/08/2014: WBC 2.1, Hgb 11.7, Plt 105 3) 11/17/2016: WBC 1.7, Hgb 12.5, Plt 86 4) 12/02/2016: Bmbx performed showing slightly hypercellular with trilineage hematopoiesis but with relative abundance of erythroid precursors. Significant dyspoiesis or increase in blastic cells is not present. Cytogenetics and flow cytometry were unrevealing.  4) 06/16/2017: WBC 1.1, Hgb 10.1, Plt 49.  5) 05/26/2019: WBC 1.5, Hgb 8.9, Plt 62 6) 06/01/2019: establish care with Dr. Lorenso Courier   CHIEF COMPLAINTS/PURPOSE OF CONSULTATION:  Pancytopenia  HISTORY OF PRESENTING ILLNESS:  Holly Mueller 54 y.o. female with medical history significant for pancytopenia complicated by ILD, GERD, gastric varic and y for continued f/u. The patient was last seen by Dr. Lebron Conners on 07/20/2017 at which time his concern was that her pancytopenia was secondary to cirrhosis of the liver.   On review of the previous records Holly Mueller has a very longstanding history of pancytopenia and was previously followed by Dr. Lebron Conners.  Patient was last seen by Dr. Lebron Conners on 07/20/2017.  As far back as our records go the patient initially began having issues with thrombocytopenia in February 2014.  By July 2018 the patient had full pancytopenia with white blood cell count 1.7 hemoglobin 12.5 and platelets 86.  A bone marrow biopsy was performed on 12/02/2016 which showed slightly hypercellular trilineage monoparesis with no significant increase in blast cells or dyspoiesis noted.  As such at that time it was thought that her hematological  findings were secondary to her cirrhosis.  On exam today Holly Mueller notes that she is still feeling fatigued from her discharge from the hospital earlier this week.  She notes all she wants to do a sleep and that she is quite tired.  She reports that she does have occasional issues with nosebleeds with her oxygen, but the nosebleeds were occurring for her prior to the administration of her oxygen therapy.  She notes that she had 1 dark bowel movement prior to admission to the hospital, but that it is not often and rare that she does see any bright red blood per rectum.  She occasionally has blood in the cough after having long coughing fits.  She notes that she has not been subject to recurrent infections and has not had any issues with pneumonias, sinus infections, or urinary tract infections.  She notes that it has been years since she has had a yeast infection. A full 10 point ROS is listed below.   MEDICAL HISTORY:  Past Medical History:  Diagnosis Date  . Acute respiratory failure with hypoxia ([place]) 09/22/2018  . Anxiety    "anxiety attacks- sometimes"  . Arthritis    bursitis left hip flares-not an issue  . Chronic kidney disease   . Community acquired pneumonia 11/28/2018  . Diabetes mellitus without complication ([place])    Type II  . Dysrhythmia   . Edentulous    10-19-13 at present  . Epilepsy ([place]) in 1972   No seizures since 1972. Previously treated with phenobarbital.   . Gastric varices with bleeding   . GERD (gastroesophageal reflux disease) 2008  . 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 Headache(784.0)    hx of migraines   . Heart murmur   . 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 History of blood transfusion    gi bleed  . History of kidney stones    passed  . Hypothyroidism 2008  . Interstitial lung disease ([place])   . Leukopenia   . Lower esophageal ring 08/18/2013  . Pneumonia   . Primary  biliary cholangitis ([place]) 10/24/2018  . Seasonal allergies 2003  . SEIZURES, HX OF 12/12/2009   Annotation: last seizure in early 1970s Qualifier: Diagnosis of  By: Carlena Sax  MD, Colletta [place]    . Shortness of breath    walking  . Wears glasses     SURGICAL HISTORY: Past Surgical History:  Procedure Laterality Date  . ABDOMINAL HYSTERECTOMY     " partial "  . BALLOON DILATION N/A 10/24/2013   Procedure: BALLOON DILATION;  Surgeon: Gatha Mayer, MD;  Location: [facility];  Service: Endoscopy;  Laterality: N/A;  . BIOPSY  09/26/2018   Procedure: BIOPSY;  Surgeon: Thornton Park, MD;  Location: [place];  Service: Gastroenterology;;  . BIOPSY  01/09/2019   Procedure: BIOPSY;  Surgeon: Irving Copas., MD;  Location: [place];  Service: Gastroenterology;;  . CESAREAN SECTION     x2  . DENTAL SURGERY     multiple extractions 3'15  . DILATION AND CURETTAGE OF UTERUS    . ESOPHAGOGASTRODUODENOSCOPY N/A 10/24/2013   Procedure: ESOPHAGOGASTRODUODENOSCOPY (EGD);  Surgeon: Gatha Mayer, MD;  Location: [facility];  Service: Endoscopy;  Laterality: N/A;  . ESOPHAGOGASTRODUODENOSCOPY N/A 01/09/2019   Procedure: ESOPHAGOGASTRODUODENOSCOPY (EGD);  Surgeon: Irving Copas., MD;  Location: [facility];  Service: Gastroenterology;  Laterality: N/A;  . ESOPHAGOGASTRODUODENOSCOPY (EGD) WITH PROPOFOL N/A 09/26/2018   Procedure: ESOPHAGOGASTRODUODENOSCOPY (EGD) WITH PROPOFOL;  Surgeon: Thornton Park, MD;  Location: [place];  Service: Gastroenterology;  Laterality: N/A;  . ESOPHAGOGASTRODUODENOSCOPY (EGD) WITH PROPOFOL N/A 04/26/2019   Procedure: ESOPHAGOGASTRODUODENOSCOPY (EGD) WITH PROPOFOL;  Surgeon: Rush Landmark Telford Nab., MD;  Location: [place];  Service: Gastroenterology;  Laterality: N/A;  . HEMOSTASIS CLIP PLACEMENT  04/26/2019   Procedure: HEMOSTASIS CLIP PLACEMENT;  Surgeon: Irving Copas., MD;  Location: [place];  Service: Gastroenterology;;  . IR  PARACENTESIS  09/23/2018  . LIVER BIOPSY  06/30/2012   Procedure: LIVER BIOPSY;  Surgeon: Shann Medal, MD;  [facility];  Service: General;;  . NOVASURE ABLATION    . SCLEROTHERAPY  04/26/2019   Procedure: Clide Deutscher;  Surgeon: Mansouraty, Telford Nab., MD;  Location: [place];  Service: Gastroenterology;;  cyanoacrylate  . SHOULDER ARTHROSCOPY Right 2011  . UMBILICAL HERNIA REPAIR N/A 06/30/2012   Procedure: remove umbilicus;  Surgeon: Shann Medal, MD;  [facility];  Service: General;  Laterality: N/A;  . UPPER ESOPHAGEAL ENDOSCOPIC ULTRASOUND (EUS) N/A 01/09/2019   Procedure: UPPER ESOPHAGEAL ENDOSCOPIC ULTRASOUND (EUS);  Surgeon: Irving Copas., MD;  Location: [place];  Service: Gastroenterology;  Laterality: N/A;  . VENTRAL HERNIA REPAIR N/A 06/30/2012   Procedure: LAPAROSCOPIC VENTRAL HERNIA;  Surgeon: Shann Medal, MD;  [facility];  Service: General;  Laterality: N/A;  With Mesh  . VIDEO BRONCHOSCOPY Bilateral 07/20/2018   Procedure: VIDEO BRONCHOSCOPY WITHOUT FLUORO;  Surgeon: Brand Males, MD;  [facility];  Service: Cardiopulmonary;  Laterality: Bilateral;  . WISDOM TOOTH EXTRACTION      SOCIAL HISTORY: Social History   Socioeconomic History  . Marital  status: Single    Spouse name: Not on file  . Number of children: 2  . Years of education: Not on file  . Highest education level: Not on file  Occupational History  . Occupation: unemployed  Tobacco Use  . Smoking status: Never Smoker  . Smokeless tobacco: Never Used  . Tobacco comment: 04/25/2019- "I smoke part of a cigarette and put it out,I did not like it."  Substance and Sexual Activity  . Alcohol use: Not Currently  . Drug use: No  . Sexual activity: Not Currently  Other Topics Concern  . 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 Difficulty of Paying Living Expenses: Very hard  Food Insecurity:   . Worried About Charity fundraiser in the Last Year: Not on file  . Ran Out of Food in the Last Year: Not on file  Transportation Needs:   . Lack of Transportation (Medical): Not on file  . Lack of Transportation (Non-Medical): Not on file  Physical Activity:   . Days of Exercise per Week: Not on file  . Minutes of Exercise per Session: Not on file  Stress:   . Feeling of Stress : Not on file  Social Connections:   . Frequency of Communication with Friends and Family: Not on file  . Frequency of Social Gatherings with Friends and Family: Not on file  . Attends Religious Services: Not on file  . Active Member of Clubs or Organizations: Not on file  . Attends Archivist Meetings: Not on file  . Marital Status: Not on file  Intimate Partner Violence:   . Fear of Current or Ex-Partner: Not on file  . Emotionally Abused: Not on file  . Physically Abused: Not on file  . Sexually Abused: Not on file    FAMILY HISTORY: Family History  Problem Relation Age of Onset  . Hypertension Mother   . Diabetes Mother   . Cirrhosis Mother        ? medications  . Lung cancer Father   . Stroke Father   . Diabetes Sister   . Hypertension Sister   . Colon cancer Neg Hx   . Esophageal cancer Neg Hx   . Rectal cancer Neg Hx     ALLERGIES:  is allergic to aspirin.  MEDICATIONS:  Current Outpatient Medications  Medication Sig Dispense Refill  . Acetaminophen (APAP) 325 MG tablet Take 2 tablets (650 mg total) by mouth every 6 (six) hours as needed. 30 tablet   . albuterol (PROAIR HFA) 108 (90 Base) MCG/ACT inhaler INHALE 2 PUFFS INTO THE LUNGS EVERY 6 HOURS AS NEEDED FOR SHORTNESS OF BREATH (Patient taking differently: Inhale 2 puffs into the lungs every 4 (four) hours as needed for wheezing or  shortness of breath. ) 8.5 g 5  . albuterol (PROVENTIL) (2.5 MG/3ML) 0.083% nebulizer solution Take 3 mLs (2.5 mg total) by nebulization every 6 (six) hours as needed for wheezing or shortness of breath. 75 mL 2  . blood glucose meter kit and supplies Dispense based on patient and insurance preference. Use up to four times daily as directed. (FOR ICD-10 E10.9, E11.9). 1 each 0  .  budesonide-formoterol (SYMBICORT) 80-4.5 MCG/ACT inhaler Inhale 2 puffs into the lungs 2 (two) times daily for 1 day. 1 Inhaler 0  . cetirizine (ZYRTEC) 10 MG tablet Take 1 tablet (10 mg total) by mouth daily. 30 tablet 11  . diclofenac Sodium (VOLTAREN) 1 % GEL Apply 4 g topically 4 (four) times daily as needed (pain). 50 g 0  . fluticasone (FLONASE) 50 MCG/ACT nasal spray Place 2 sprays into both nostrils daily.    Marland Kitchen glucose blood (TRUE METRIX BLOOD GLUCOSE TEST) test strip Use as instructed 100 each 12  . levothyroxine (SYNTHROID) 125 MCG tablet TAKE 2 TABLETS (250 MCG TOTAL) BY MOUTH DAILY. (Patient taking differently: Take 125 mcg by mouth 2 (two) times daily. ) 60 tablet 3  . metFORMIN (GLUCOPHAGE) 500 MG tablet Take 1 tablet (500 mg total) by mouth 2 (two) times daily with a meal. 180 tablet 2  . OXYGEN Inhale 2 L into the lungs continuous.     . pantoprazole (PROTONIX) 40 MG tablet Take 1 tablet (40 mg total) by mouth 2 (two) times daily before a meal. 60 tablet 3  . SPIRIVA HANDIHALER 18 MCG inhalation capsule INHALE 1 CAPSULE VIA HANDIHALER ONCE DAILY AT THE SAME TIME EVERY DAY (Patient taking differently: Place 18 mcg into inhaler and inhale daily. ) 30 capsule 0  . TRUEplus Lancets 28G MISC Use to check blood sugar as directed. 100 each 1  . ursodiol (ACTIGALL) 300 MG capsule Take 2 capsules (600 mg total) by mouth 3 (three) times daily. 180 capsule 0   No current facility-administered medications for this visit.    REVIEW OF SYSTEMS:   Constitutional: ( - ) fevers, ( - )  chills , ( - ) night sweats Eyes: (  - ) blurriness of vision, ( - ) double vision, ( - ) watery eyes Ears, nose, mouth, throat, and face: ( - ) mucositis, ( - ) sore throat Respiratory: ( + ) cough, ( + ) dyspnea, ( - ) wheezes Cardiovascular: ( - ) palpitation, ( - ) chest discomfort, ( - ) lower extremity swelling Gastrointestinal:  ( - ) nausea, ( - ) heartburn, ( - ) change in bowel habits Skin: ( - ) abnormal skin rashes Lymphatics: ( - ) new lymphadenopathy, ( - ) easy bruising Neurological: ( - ) numbness, ( - ) tingling, ( - ) new weaknesses Behavioral/Psych: ( - ) mood change, ( - ) new changes  All other systems were reviewed with the patient and are negative.  PHYSICAL EXAMINATION: ECOG PERFORMANCE STATUS: 3 - Symptomatic, >50% confined to bed  Vitals:   06/01/19 1336  BP: 131/75  Pulse: 85  Resp: 20  Temp: 98 F (36.7 C)  SpO2: 96%   Filed Weights   06/01/19 1336  Weight: 236 lb 14.4 oz (107.5 kg)    GENERAL:chronically ill appearing Caucasian female in NAD, appears older than stated age.  SKIN: skin color, texture, turgor are normal, no rashes or significant lesions EYES: conjunctiva are pink and non-injected, sclera clear LUNGS: fine crackles in lung bases bilaterally, no  HEART: regular rate & rhythm and no murmurs and no lower extremity edema Musculoskeletal: no cyanosis of digits and no clubbing  PSYCH: alert & oriented x 3, fluent speech NEURO: no focal motor/sensory deficits  LABORATORY DATA:  I have reviewed the data as listed CBC Latest Ref Rng & Units 06/01/2019 05/26/2019 05/25/2019  WBC 4.0 - 10.5 K/uL 1.3(L) 1.5(L) 1.5(L)  Hemoglobin 12.0 - 15.0 g/dL  10.3(L) 8.9(L) 9.6(L)  Hematocrit 36.0 - 46.0 % 31.6(L) 27.8(L) 30.9(L)  Platelets 150 - 400 K/uL 56(L) 62(L) 63(L)    CMP Latest Ref Rng & Units 06/01/2019 05/30/2019 05/26/2019  Glucose 70 - 99 mg/dL 180(H) 172(H) 172(H)  BUN 6 - 20 mg/dL _0 Creatinine 0.44 - 1.00 mg/dL 0.68 0.61 0.64  Sodium 135 - 145 mmol/L 136 136 136    Potassium 3.5 - 5.1 mmol/L 3.9 3.7 3.9  Chloride 98 - 111 mmol/L 110 106 105  CO2 22 - 32 mmol/L 20(L) 20 24  Calcium 8.9 - 10.3 mg/dL 8.1(L) 8.7 8.3(L)  Total Protein 6.5 - 8.1 g/dL 7.1 6.9 -  Total Bilirubin 0.3 - 1.2 mg/dL 1.9(H) 1.8(H) -  Alkaline Phos 38 - 126 U/L 161(H) 164(H) -  AST 15 - 41 U/L 45(H) 47(H) -  ALT 0 - 44 U/L 31 29 -    PATHOLOGY: None to review.   BLOOD FILM:  Review of the peripheral blood smear showed marked decrease in the number of plts and WBC. What WBC were present appeared normal, with neutrophils that were appropriately lobated and granulated. There was no predominance of bi-lobed or hyper-segmented neutrophils appreciated. No Dohle bodies were noted. There was no left shifting, immature forms or blasts noted. Lymphocytes remain normal in size without any predominance of large granular lymphocytes. Red cells show no anisopoikilocytosis, macrocytes , microcytes or polychromasia. There were no schistocytes, target cells, echinocytes, acanthocytes, dacrocytes, or stomatocytes.There was no rouleaux formation, nucleated red cells, or intra-cellular inclusions noted. The platelets are normal in size, shape, and color without any clumping evident, though markedly reduced in number.   RADIOGRAPHIC STUDIES:  DG Chest 2 View  Result Date: 05/23/2019 CLINICAL DATA:  Pneumonia. Shortness of breath/midline chest congestion for 2 weeks, recent COVID test yesterday (negative). EXAM: CHEST - 2 VIEW COMPARISON:  Chest radiograph 05/22/2019 FINDINGS: Cardiomediastinal silhouette unchanged. Shallow inspiration radiograph. Probable mild incomplete atelectasis at the medial left lung base. No definite airspace consolidation. No evidence of pleural effusion or pneumothorax. No acute bony abnormality. Overlying cardiac monitoring leads. IMPRESSION: Shallow inspiration radiograph. Probable mild incomplete atelectasis at the medial left lung base. No definite airspace consolidation.  Electronically Signed   By: Kellie Simmering DO   On: 05/23/2019 07:59   US Abdomen Complete  Result Date: 05/11/2019 CLINICAL DATA:  Hepatic cirrhosis EXAM: ABDOMEN ULTRASOUND COMPLETE COMPARISON:  Hepatic duplex ultrasound Sep 24, 2018; CT abdomen and pelvis December 09, 2018 FINDINGS: Gallbladder: No gallstones or wall thickening visualized. There is no pericholecystic fluid. No sonographic Murphy sign noted by sonographer. Common bile duct: Diameter: 7 mm, upper normal in size. No appreciable intrahepatic, common hepatic, or common bile duct dilatation is demonstrable. Liver: No focal lesion identified. Liver echogenicity is increased diffusely. The liver echotexture is coarse with a nodular contour. Portal vein is patent on color Doppler imaging with normal direction of blood flow towards the liver. IVC: No abnormality visualized. Pancreas: Visualized portion unremarkable. Portions of pancreas obscured by gas. Spleen: Spleen measures 18.9 x 22.5 x 9.6 cm with a measured splenic volume of 2,139 cubic cm. No focal splenic lesions are identified. No perisplenic fluid. Right Kidney: Length: 13.4 cm. Echogenicity within normal limits. No mass or hydronephrosis visualized. Left Kidney: Length: 14.1 cm. Echogenicity within normal limits. No mass or hydronephrosis visualized. There is scarring along the upper to mid kidney on the left. Abdominal aorta: No aneurysm visualized. Other findings: No evident ascites. IMPRESSION: 1. The liver has  a nodular contour with coarse, somewhat inhomogeneous and overall increased echotexture. This appearance is indicative of hepatic cirrhosis. While no focal liver lesions are evident on this study, it must be cautioned that the sensitivity of ultrasound for detection of focal liver lesions is diminished significantly in this circumstance. 2.  Splenomegaly. 3.  Scarring in portions of left kidney. 4. Portions of pancreas obscured by gas. Visualized portions of pancreas appear  unremarkable. Electronically Signed   By: Lowella Grip III M.D.   On: 05/11/2019 07:48   CT Chest High Resolution  Result Date: 05/24/2019 CLINICAL DATA:  54 year old female with history of acute respiratory failure with hypoxia. Evaluate for interstitial lung disease. EXAM: CT CHEST WITHOUT CONTRAST TECHNIQUE: Multidetector CT imaging of the chest was performed following the standard protocol without intravenous contrast. High resolution imaging of the lungs, as well as inspiratory and expiratory imaging, was performed. COMPARISON:  Chest CT 12/09/2018. FINDINGS: Cardiovascular: Heart size is mildly enlarged. There is no significant pericardial fluid, thickening or pericardial calcification. Aortic atherosclerosis. No definite coronary artery calcifications. Mild calcifications of the aortic valve. Mediastinum/Nodes: No pathologically enlarged mediastinal or hilar lymph nodes. Please note that accurate exclusion of hilar adenopathy is limited on noncontrast CT scans. Esophagus is unremarkable in appearance. No axillary lymphadenopathy. Lungs/Pleura: In the periphery of the right upper lobe near the apex (axial image 24 of series 11) there is a new well-defined 2.7 x 1.4 cm area of ground-glass attenuation. High-resolution images otherwise demonstrate patchy areas of peripheral predominant ground-glass attenuation, septal thickening, regional architectural distortion, scattered regions of mild cylindrical bronchiectasis and peripheral bronchiolectasis. No frank honeycombing. These findings appear similar to the prior study (although direct comparison is limited by extensive respiratory motion on the prior study) and have a mild craniocaudal gradient. Inspiratory and expiratory imaging is unremarkable. No pleural effusions. Upper Abdomen: Liver has a shrunken appearance and nodular contour, suggesting underlying cirrhosis. Mild diffuse low attenuation throughout the visualized hepatic parenchyma, indicative  of hepatic steatosis. Spleen is incompletely imaged, but appears enlarged measuring at least 14.7 cm on axial images. Postoperative changes in the proximal stomach incompletely imaged. Musculoskeletal: There are no aggressive appearing lytic or blastic lesions noted in the visualized portions of the skeleton. IMPRESSION: 1. Similar appearance of the lungs with a spectrum of findings considered probable usual interstitial pneumonia (UIP) per current ats guidelines. 2. New nodular area of ground-glass attenuation in the periphery of the right upper lobe. This could be of infectious or inflammatory etiology, or could represent an area of active inflammation and developing fibrosis. Attention on follow-up high-resolution chest CT is recommended in 6-12 months. 3. Aortic atherosclerosis. 4. There are calcifications of the aortic valve. Echocardiographic correlation for evaluation of potential valvular dysfunction may be warranted if clinically indicated. 5. Hepatic steatosis. Aortic Atherosclerosis (ICD10-I70.0). Electronically Signed   By: Vinnie Langton M.D.   On: 05/24/2019 08:16   DG Chest Port 1 View  Result Date: 05/22/2019 CLINICAL DATA:  Cough. EXAM: PORTABLE CHEST 1 VIEW COMPARISON:  May 15, 2019. FINDINGS: Stable cardiomediastinal silhouette. No pneumothorax is noted. Right lung is clear. Minimal left basilar subsegmental atelectasis or infiltrate is noted. No significant pleural effusion is noted. Bony thorax is unremarkable. IMPRESSION: Minimal left basilar subsegmental atelectasis or infiltrate is noted. No significant pleural effusion is noted. Electronically Signed   By: Marijo Conception M.D.   On: 05/22/2019 15:30   DG Chest Portable 1 View  Result Date: 05/15/2019 CLINICAL DATA:  Chest pain and short of  breath EXAM: PORTABLE CHEST 1 VIEW COMPARISON:  12/08/2018, 07/05/2018, CT chest 12/09/2018, 04/26/2018 FINDINGS: Low lung volumes. Diffuse coarse interstitial opacity appears chronic. No  pleural effusion. Stable cardiomediastinal silhouette with aortic atherosclerosis. No pneumothorax. IMPRESSION: Low lung volumes with coarse chronic appearing diffuse interstitial opacity. No definite acute superimposed airspace disease Electronically Signed   By: Donavan Foil M.D.   On: 05/15/2019 02:08    ASSESSMENT & PLAN Holly Mueller 54 y.o. female with medical history significant for pancytopenia complicated by ILD, GERD, gastric varices with bleeding, DM type II, CKD, and cirrhosis who presents to establish care with Dr. Lorenso Courier for continued f/u.  After review the labs and discussion with the patient her findings are most consistent with a pancytopenia secondary to cirrhosis.  She has undergone extensive work-up including eluding bone marrow biopsy and full nutritional evaluation.  Up to this point but there has been no clear alternative etiology discerned.  As such I do not believe that there are any particular interventions that can be undertaken in the event that this is secondary to her cirrhosis.  In order to assure no other possible causes we will do lysis labs, multiple myeloma work-up, and repeat nutritional studies to assure nothing is missing.  At this time I do not think there would be any value in a repeat bone marrow biopsy.  We will have the patient follow-up in our clinic in approximately 6 months time.  If she were to require surgical intervention or have other complications due to her low blood counts we would be happy to assist in the management and make recommendations regarding improving this counts.  Otherwise in the interim I do not feel that she has to follow with [place] frequently.  #Pancytopenia in the Setting of Cirrhosis --patient has undergone a detailed workup including bone marrow biopsy and full nutritional evaluation with Dr. Lebron Conners --today will order LDH, haptoglobin, DAT, and further nutritional studies --additionally will collect SPEP and SFLC --etiology at  this time is likely worsening cirrhosis and decline in counts. Will evaluate for causes that can be modified, however given the results of prior workup it is unlikely this will be fruitful.  --no clear indication for a repeat bone marrow biopsy at this time --RTC in 6 months to re-evaluate.   Orders Placed This Encounter  Procedures  . CBC with Differential (Cancer Center Only)    Standing Status:   Future    Number of Occurrences:   1    Standing Expiration Date:   05/31/2020  . Immature Platelet Fraction    Standing Status:   Future    Number of Occurrences:   1    Standing Expiration Date:   05/31/2020  . Retic Panel    Standing Status:   Future    Number of Occurrences:   1    Standing Expiration Date:   05/31/2020  . Save Smear ([facility])    Standing Status:   Future    Number of Occurrences:   1    Standing Expiration Date:   05/31/2020  . CMP ([facility] only)    Standing Status:   Future    Number of Occurrences:   1    Standing Expiration Date:   05/31/2020  . Lactate dehydrogenase (LDH)    Standing Status:   Future    Number of Occurrences:   1    Standing Expiration Date:   05/31/2020  . Iron and TIBC    Standing Status:  Future    Number of Occurrences:   1    Standing Expiration Date:   05/31/2020  . Ferritin    Standing Status:   Future    Number of Occurrences:   1    Standing Expiration Date:   05/31/2020  . Erythropoietin    Standing Status:   Future    Number of Occurrences:   1    Standing Expiration Date:   05/31/2020  . Vitamin B12    Standing Status:   Future    Number of Occurrences:   1    Standing Expiration Date:   05/31/2020  . Folate, Serum    Standing Status:   Future    Number of Occurrences:   1    Standing Expiration Date:   05/31/2020  . Methylmalonic acid, serum    Standing Status:   Future    Number of Occurrences:   1    Standing Expiration Date:   05/31/2020  . SPEP (Serum protein electrophoresis)    Standing Status:   Future    Number  of Occurrences:   1    Standing Expiration Date:   05/31/2020  . Kappa/lambda light chains    Standing Status:   Future    Number of Occurrences:   1    Standing Expiration Date:   05/31/2020  . Homocysteine, serum    Standing Status:   Future    Number of Occurrences:   1    Standing Expiration Date:   05/31/2020  . Haptoglobin    Standing Status:   Future    Number of Occurrences:   1    Standing Expiration Date:   05/31/2020  . Protime-INR    Standing Status:   Future    Number of Occurrences:   1    Standing Expiration Date:   05/31/2020  . Direct antiglobulin test ([place])    Standing Status:   Future    Number of Occurrences:   1    Standing Expiration Date:   05/31/2020   All questions were answered. The patient knows to call the clinic with any problems, questions or concerns.  A total of more than 40 minutes were spent on this encounter and over half of that time was spent on counseling and coordination of care as outlined above.   Ledell Peoples, MD Department of Hematology/Oncology [place] at [facility] Phone: [phone] Pager: [phone] Email: [email or web address]  06/03/2019 2:37 PM

## 2019-05-30 ENCOUNTER — Other Ambulatory Visit: Payer: Self-pay

## 2019-05-30 ENCOUNTER — Ambulatory Visit (INDEPENDENT_AMBULATORY_CARE_PROVIDER_SITE_OTHER): Payer: Medicaid Other | Admitting: Family Medicine

## 2019-05-30 ENCOUNTER — Encounter: Payer: Self-pay | Admitting: Family Medicine

## 2019-05-30 VITALS — BP 108/60 | HR 88 | Wt 234.0 lb

## 2019-05-30 DIAGNOSIS — K743 Primary biliary cirrhosis: Secondary | ICD-10-CM

## 2019-05-30 DIAGNOSIS — Z23 Encounter for immunization: Secondary | ICD-10-CM

## 2019-05-30 DIAGNOSIS — J849 Interstitial pulmonary disease, unspecified: Secondary | ICD-10-CM

## 2019-05-30 NOTE — Patient Instructions (Signed)
It looks like you are all set up with appointments with the lung doctors and liver doctors.  I have put in a referral for you to see palliative care.  Please give [place] a call if he has not heard anything in the next 2 weeks.  We will let you know the results of your blood work today.

## 2019-05-30 NOTE — Assessment & Plan Note (Signed)
Has an appointment to follow-up with pulmonology in 1 month.  She was informed that she can increase her nasal cannula to 3-5 L with ambulation if needed. -Follow-up with pulmonology -Ambulatory referral to palliative care placed

## 2019-05-30 NOTE — Assessment & Plan Note (Signed)
-  Follow-up CMP today -Follow-up with GI in 1 month

## 2019-05-30 NOTE — Progress Notes (Signed)
    Subjective:  Holly Mueller is a 54 y.o. female who presents to the [facility] today for hospital follow-up.   HPI:  Interstitial lung disease, hospital follow-up [name] was recently hospitalized for acute hypoxic respiratory failure likely secondary to progression of her interstitial lung disease.  During that hospitalization, she met with pulmonology who shared their concerns that she is likely experiencing irreversible deterioration of her lung function.  She also met with palliative care and discussed goals of care.  Today in clinic, she reports that her energy level is slowly improving since she left the hospital.  Initially, she needed to stay in bed most of the day but now she is able to get up and move around.  Her breathing difficulties remain unchanged.  She requires 2 L nasal cannula at rest and quickly rose short of breath with exertion.  She does not just her oxygen with activity.  She briefly discussed how she spoke with palliative care during this hospitalization and is aware that her current lung disease will likely lead to her death.  She became tearful when discussing how difficult this will be to discuss with her family.  She plans on having a family meeting this weekend to tell her children that she is experiencing worsened symptoms.  At this time, she is very reluctant to consider hospice care and reports that she will fight to the end.  She has an appointment with pulmonology in February and is also planning to see her liver doctor in February.   Objective:  Physical Exam: BP 108/60   Pulse 88   Wt 234 lb (106.1 kg)   LMP  (LMP Unknown)   SpO2 95%   BMI 38.35 kg/m    Gen: Appears older than stated age.  Ill-appearing woman.  No acute distress.  Easily fatigable.  Telangiectasias noted diffusely.  Nasal cannula in place. Cardiac: 2/6 systolic murmur noted primarily of the left sternal border.  No rubs/gallops noted Respiratory: Breathing with 2 L nasal cannula.   No distress while seated.  Quickly fatigable with ambulation.  Fine crackles noted diffusely on auscultation.  No results found for this or any previous visit (from the past 72 hour(s)).   Assessment/Plan:  ILD (interstitial lung disease) ([place]) Has an appointment to follow-up with pulmonology in 1 month.  She was informed that she can increase her nasal cannula to 3-5 L with ambulation if needed. -Follow-up with pulmonology -Ambulatory referral to palliative care placed  Primary biliary cholangitis ([place]) -Follow-up CMP today -Follow-up with GI in 1 month

## 2019-05-31 LAB — COMPREHENSIVE METABOLIC PANEL
ALT: 29 IU/L (ref 0–32)
AST: 47 IU/L — ABNORMAL HIGH (ref 0–40)
Albumin/Globulin Ratio: 0.8 — ABNORMAL LOW (ref 1.2–2.2)
Albumin: 3.1 g/dL — ABNORMAL LOW (ref 3.8–4.9)
Alkaline Phosphatase: 164 IU/L — ABNORMAL HIGH (ref 39–117)
BUN/Creatinine Ratio: 15 (ref 9–23)
BUN: 9 mg/dL (ref 6–24)
Bilirubin Total: 1.8 mg/dL — ABNORMAL HIGH (ref 0.0–1.2)
CO2: 20 mmol/L (ref 20–29)
Calcium: 8.7 mg/dL (ref 8.7–10.2)
Chloride: 106 mmol/L (ref 96–106)
Creatinine, Ser: 0.61 mg/dL (ref 0.57–1.00)
GFR calc Af Amer: 119 mL/min/{1.73_m2} (ref >59)
GFR calc non Af Amer: 103 mL/min/{1.73_m2} (ref >59)
Globulin, Total: 3.8 g/dL (ref 1.5–4.5)
Glucose: 172 mg/dL — ABNORMAL HIGH (ref 65–99)
Potassium: 3.7 mmol/L (ref 3.5–5.2)
Sodium: 136 mmol/L (ref 134–144)
Total Protein: 6.9 g/dL (ref 6.0–8.5)

## 2019-06-01 ENCOUNTER — Other Ambulatory Visit: Payer: Self-pay

## 2019-06-01 ENCOUNTER — Inpatient Hospital Stay: Payer: Medicaid Other | Attending: Hematology and Oncology | Admitting: Hematology and Oncology

## 2019-06-01 ENCOUNTER — Inpatient Hospital Stay: Payer: Medicaid Other

## 2019-06-01 VITALS — BP 131/75 | HR 85 | Temp 98.0°F | Resp 20 | Ht 65.5 in | Wt 236.9 lb

## 2019-06-01 DIAGNOSIS — E1122 Type 2 diabetes mellitus with diabetic chronic kidney disease: Secondary | ICD-10-CM | POA: Diagnosis not present

## 2019-06-01 DIAGNOSIS — K7460 Unspecified cirrhosis of liver: Secondary | ICD-10-CM | POA: Diagnosis not present

## 2019-06-01 DIAGNOSIS — R5383 Other fatigue: Secondary | ICD-10-CM | POA: Insufficient documentation

## 2019-06-01 DIAGNOSIS — D61818 Other pancytopenia: Secondary | ICD-10-CM

## 2019-06-01 DIAGNOSIS — N189 Chronic kidney disease, unspecified: Secondary | ICD-10-CM | POA: Diagnosis not present

## 2019-06-01 LAB — CBC WITH DIFFERENTIAL (CANCER CENTER ONLY)
Abs Immature Granulocytes: 0.01 10*3/uL (ref 0.00–0.07)
Basophils Absolute: 0 10*3/uL (ref 0.0–0.1)
Basophils Relative: 2 %
Eosinophils Absolute: 0.1 10*3/uL (ref 0.0–0.5)
Eosinophils Relative: 8 %
HCT: 31.6 % — ABNORMAL LOW (ref 36.0–46.0)
Hemoglobin: 10.3 g/dL — ABNORMAL LOW (ref 12.0–15.0)
Immature Granulocytes: 1 %
Lymphocytes Relative: 24 %
Lymphs Abs: 0.3 10*3/uL — ABNORMAL LOW (ref 0.7–4.0)
MCH: 27.5 pg (ref 26.0–34.0)
MCHC: 32.6 g/dL (ref 30.0–36.0)
MCV: 84.3 fL (ref 80.0–100.0)
Monocytes Absolute: 0.1 10*3/uL (ref 0.1–1.0)
Monocytes Relative: 11 %
Neutro Abs: 0.7 10*3/uL — ABNORMAL LOW (ref 1.7–7.7)
Neutrophils Relative %: 54 %
Platelet Count: 56 10*3/uL — ABNORMAL LOW (ref 150–400)
RBC: 3.75 MIL/uL — ABNORMAL LOW (ref 3.87–5.11)
RDW: 15.4 % (ref 11.5–15.5)
WBC Count: 1.3 10*3/uL — ABNORMAL LOW (ref 4.0–10.5)
nRBC: 0 % (ref 0.0–0.2)

## 2019-06-01 LAB — CMP (CANCER CENTER ONLY)
ALT: 31 U/L (ref 0–44)
AST: 45 U/L — ABNORMAL HIGH (ref 15–41)
Albumin: 2.8 g/dL — ABNORMAL LOW (ref 3.5–5.0)
Alkaline Phosphatase: 161 U/L — ABNORMAL HIGH (ref 38–126)
Anion gap: 6 (ref 5–15)
BUN: 11 mg/dL (ref 6–20)
CO2: 20 mmol/L — ABNORMAL LOW (ref 22–32)
Calcium: 8.1 mg/dL — ABNORMAL LOW (ref 8.9–10.3)
Chloride: 110 mmol/L (ref 98–111)
Creatinine: 0.68 mg/dL (ref 0.44–1.00)
GFR, Est AFR Am: >60 mL/min (ref >60)
GFR, Estimated: >60 mL/min (ref >60)
Glucose, Bld: 180 mg/dL — ABNORMAL HIGH (ref 70–99)
Potassium: 3.9 mmol/L (ref 3.5–5.1)
Sodium: 136 mmol/L (ref 135–145)
Total Bilirubin: 1.9 mg/dL — ABNORMAL HIGH (ref 0.3–1.2)
Total Protein: 7.1 g/dL (ref 6.5–8.1)

## 2019-06-01 LAB — IRON AND TIBC
Iron: 112 ug/dL (ref 41–142)
Saturation Ratios: 28 % (ref 21–57)
TIBC: 405 ug/dL (ref 236–444)
UIBC: 293 ug/dL (ref 120–384)

## 2019-06-01 LAB — RETIC PANEL
Immature Retic Fract: 15.6 % (ref 2.3–15.9)
RBC.: 3.7 MIL/uL — ABNORMAL LOW (ref 3.87–5.11)
Retic Count, Absolute: 185.7 10*3/uL (ref 19.0–186.0)
Retic Ct Pct: 5 % — ABNORMAL HIGH (ref 0.4–3.1)
Reticulocyte Hemoglobin: 32.8 pg (ref >27.9)

## 2019-06-01 LAB — FOLATE: Folate: 19.3 ng/mL (ref >5.9)

## 2019-06-01 LAB — FERRITIN: Ferritin: 14 ng/mL (ref 11–307)

## 2019-06-01 LAB — PROTIME-INR
INR: 1.2 (ref 0.8–1.2)
Prothrombin Time: 15.1 seconds (ref 11.4–15.2)

## 2019-06-01 LAB — DIRECT ANTIGLOBULIN TEST (NOT AT ARMC)
DAT, IgG: NEGATIVE
DAT, complement: NEGATIVE

## 2019-06-01 LAB — SAVE SMEAR(SSMR), FOR PROVIDER SLIDE REVIEW

## 2019-06-01 LAB — VITAMIN B12: Vitamin B-12: 560 pg/mL (ref 180–914)

## 2019-06-01 LAB — IMMATURE PLATELET FRACTION: Immature Platelet Fraction: 1.6 % (ref 1.2–8.6)

## 2019-06-01 LAB — LACTATE DEHYDROGENASE: LDH: 280 U/L — ABNORMAL HIGH (ref 98–192)

## 2019-06-02 ENCOUNTER — Telehealth: Payer: Self-pay | Admitting: Family Medicine

## 2019-06-02 LAB — ERYTHROPOIETIN: Erythropoietin: 63.1 m[IU]/mL — ABNORMAL HIGH (ref 2.6–18.5)

## 2019-06-02 LAB — KAPPA/LAMBDA LIGHT CHAINS
Kappa free light chain: 57.2 mg/L — ABNORMAL HIGH (ref 3.3–19.4)
Kappa, lambda light chain ratio: 1.41 (ref 0.26–1.65)
Lambda free light chains: 40.5 mg/L — ABNORMAL HIGH (ref 5.7–26.3)

## 2019-06-02 LAB — PROTEIN ELECTROPHORESIS, SERUM
A/G Ratio: 0.7 (ref 0.7–1.7)
Albumin ELP: 2.8 g/dL — ABNORMAL LOW (ref 2.9–4.4)
Alpha-1-Globulin: 0.2 g/dL (ref 0.0–0.4)
Alpha-2-Globulin: 0.4 g/dL (ref 0.4–1.0)
Beta Globulin: 0.9 g/dL (ref 0.7–1.3)
Gamma Globulin: 2.5 g/dL — ABNORMAL HIGH (ref 0.4–1.8)
Globulin, Total: 4 g/dL — ABNORMAL HIGH (ref 2.2–3.9)
Total Protein ELP: 6.8 g/dL (ref 6.0–8.5)

## 2019-06-02 LAB — HAPTOGLOBIN: Haptoglobin: <10 mg/dL — ABNORMAL LOW (ref 33–346)

## 2019-06-02 LAB — HOMOCYSTEINE: Homocysteine: 9.2 umol/L (ref 0.0–14.5)

## 2019-06-02 MED FILL — metFORMIN HCL 500 MG TABS: 500 | 30 days supply | Qty: 60 | Fill #2

## 2019-06-02 MED FILL — PANTOPRAZOLE SOD DR 40 MG T: 40 | 30 days supply | Qty: 60 | Fill #3

## 2019-06-02 MED FILL — LEVOTHYROXINE 125 MCG TAB: 125 | 30 days supply | Qty: 60 | Fill #1

## 2019-06-02 NOTE — Addendum Note (Signed)
Addended by: Grant Ruts on: 06/02/2019 10:04 AM   Modules accepted: Orders

## 2019-06-02 NOTE — Chronic Care Management (AMB) (Signed)
  Care Management   Note  06/02/2019 Name: Holly Mueller MRN: [id number] DOB: 11-01-1965  Holly Mueller is a 54 y.o. year old female who is a primary care patient of Meccariello, Bernita Raisin, DO. I reached out to [name] by phone today in response to a referral sent by [name]'s health plan.    [name] was given information about care management services today including:  1. Care management services include personalized support from designated clinical staff supervised by her physician, including individualized plan of care and coordination with other care providers 2. 24/7 contact phone numbers for assistance for urgent and routine care needs. 3. The patient may stop care management services at any time by phone call to the office staff.  Patient agreed to services and verbal consent obtained.   Follow up plan: Telephone appointment with care management team member scheduled for:06/06/2019  Glenna Durand, LPN Health Advisor, [place] Management ??[email or web address] ??[phone]

## 2019-06-02 NOTE — Chronic Care Management (AMB) (Signed)
  Care Management   Outreach Note  06/02/2019 Name: Holly Mueller MRN: [id number] DOB: 1965-08-21  Referred by: Dr. Matilde Haymaker Reason for referral : Care Management (CM Initial outreach unsuccessful)   An unsuccessful telephone outreach was attempted today. The patient was referred to the case management team by for assistance with care management and care coordination.   Follow Up Plan: A HIPPA compliant phone message was left for the patient providing contact information and requesting a return call.  The care management team will reach out to the patient again over the next 7 days.  If patient returns call to provider office, please advise to call Embedded Care Management Care Guide Holly Durand LPN at [phone]  Holly Buehler, LPN Health Advisor, [place] Management ??[email or web address] ??[phone]

## 2019-06-03 ENCOUNTER — Encounter: Payer: Self-pay | Admitting: Hematology and Oncology

## 2019-06-05 ENCOUNTER — Telehealth: Payer: Self-pay | Admitting: Hematology and Oncology

## 2019-06-05 NOTE — Telephone Encounter (Signed)
Scheduled per los. Called and left msg. Mailed printout

## 2019-06-06 ENCOUNTER — Ambulatory Visit: Payer: Medicaid Other

## 2019-06-06 ENCOUNTER — Ambulatory Visit: Payer: Self-pay

## 2019-06-06 ENCOUNTER — Other Ambulatory Visit: Payer: Self-pay

## 2019-06-06 ENCOUNTER — Telehealth: Payer: Self-pay

## 2019-06-06 DIAGNOSIS — J849 Interstitial pulmonary disease, unspecified: Secondary | ICD-10-CM

## 2019-06-06 LAB — METHYLMALONIC ACID, SERUM: Methylmalonic Acid, Quantitative: 287 nmol/L (ref 0–378)

## 2019-06-06 NOTE — Chronic Care Management (AMB) (Signed)
  Care Management   Outreach Note  06/06/2019 Name: Holly Mueller MRN: [id number] DOB: May 24, 1965  Referred by: Cleophas Dunker, DO Reason for referral : Care Coordination (Care Management RNCM ILD)   An unsuccessful telephone outreach was attempted today. The patient was referred to the case management team by for assistance with care management and care coordination.   Follow Up Plan: A HIPPA compliant phone message was left for the patient providing contact information and requesting a return call.  The care management team will reach out to the patient again over the next 7 days.   Lazaro Arms RN, BSN, [facility] Care Management Coordinator [place] Phone: [phone] Fax: [phone]

## 2019-06-06 NOTE — Telephone Encounter (Signed)
Phone call placed to patient to introduce Palliative care and to offer to schedule visit with NP. Verbal consent obtained for Palliative Care. Visit scheduled for Monday 06/12/19 @ 2:30pm via Telephone.

## 2019-06-06 NOTE — Chronic Care Management (AMB) (Signed)
Care Management   Initial Visit Note  06/06/2019 Name: Holly Mueller MRN: [id number] DOB: 27-Jul-1965   Assessment: Holly Mueller is a 54 y.o. year old female who sees Meccariello, Bernita Raisin, DO for primary care. The care management team was consulted for assistance with care management and care coordination needs related to Care Coordination.   Review of patient status, including review of consultants reports, relevant laboratory and other test results, and collaboration with appropriate care team members and the patient's provider was performed as part of comprehensive patient evaluation and provision of care management services.    SDOH (Social Determinants of Health) screening performed today: None. See Care Plan for related entries.    Outpatient Encounter Medications as of 06/06/2019  Medication Sig  . Acetaminophen (APAP) 325 MG tablet Take 2 tablets (650 mg total) by mouth every 6 (six) hours as needed.  Marland Kitchen albuterol (PROAIR HFA) 108 (90 Base) MCG/ACT inhaler INHALE 2 PUFFS INTO THE LUNGS EVERY 6 HOURS AS NEEDED FOR SHORTNESS OF BREATH (Patient taking differently: Inhale 2 puffs into the lungs every 4 (four) hours as needed for wheezing or shortness of breath. )  . albuterol (PROVENTIL) (2.5 MG/3ML) 0.083% nebulizer solution Take 3 mLs (2.5 mg total) by nebulization every 6 (six) hours as needed for wheezing or shortness of breath.  . blood glucose meter kit and supplies Dispense based on patient and insurance preference. Use up to four times daily as directed. (FOR ICD-10 E10.9, E11.9).  . budesonide-formoterol (SYMBICORT) 80-4.5 MCG/ACT inhaler Inhale 2 puffs into the lungs 2 (two) times daily for 1 day.  . cetirizine (ZYRTEC) 10 MG tablet Take 1 tablet (10 mg total) by mouth daily.  . diclofenac Sodium (VOLTAREN) 1 % GEL Apply 4 g topically 4 (four) times daily as needed (pain).  . fluticasone (FLONASE) 50 MCG/ACT nasal spray Place 2 sprays into both nostrils daily.  Marland Kitchen  glucose blood (TRUE METRIX BLOOD GLUCOSE TEST) test strip Use as instructed  . levothyroxine (SYNTHROID) 125 MCG tablet TAKE 2 TABLETS (250 MCG TOTAL) BY MOUTH DAILY. (Patient taking differently: Take 125 mcg by mouth 2 (two) times daily. )  . metFORMIN (GLUCOPHAGE) 500 MG tablet Take 1 tablet (500 mg total) by mouth 2 (two) times daily with a meal.  . OXYGEN Inhale 2 L into the lungs continuous.   . pantoprazole (PROTONIX) 40 MG tablet Take 1 tablet (40 mg total) by mouth 2 (two) times daily before a meal.  . SPIRIVA HANDIHALER 18 MCG inhalation capsule INHALE 1 CAPSULE VIA HANDIHALER ONCE DAILY AT THE SAME TIME EVERY DAY (Patient taking differently: Place 18 mcg into inhaler and inhale daily. )  . TRUEplus Lancets 28G MISC Use to check blood sugar as directed.  . ursodiol (ACTIGALL) 300 MG capsule Take 2 capsules (600 mg total) by mouth 3 (three) times daily.   No facility-administered encounter medications on file as of 06/06/2019.    Goals Addressed            This Visit's Progress   . " My diagnosis scares me" (pt-stated)       Current Barriers:  . Chronic Disease Management support,  care coordination needs related to  ILD  Clinical Goal(s) related to  ILD :  Over the next **14* days, patient will:  . Work with the care management team to address care coordination needs  . Call care management team with questions or concerns . Verbalize basic understanding of patient centered plan of care established today  Interventions related to  ILD :  . Evaluation of current treatment plans and patient's adherence to plan as established by provider . Assessed patient understanding of disease states . Assessed patient's education and care coordination needs . Collaborated with appropriate clinical care team members regarding patient needs . Contact Palliative Care to see when they are planning their initial call.  Patient Self Care Activities related to  ILD :  . Patient is unable to  independently self-manage chronic health conditions  Initial goal documentation         Follow up plan:  The care management team will reach out to the patient again over the next 14 days.  The patient has been provided with contact information for the care management team and has been advised to call with any health related questions or concerns.   [name] was given information about Care Management services today including:  1. Care Management services include personalized support from designated clinical staff supervised by a physician, including individualized plan of care and coordination with other care providers 2. 24/7 contact phone numbers for assistance for urgent and routine care needs. 3. The patient may stop Care Management services at any time (effective at the end of the month) by phone call to the office staff.  Patient agreed to services and verbal consent obtained.  Lazaro Arms RN, BSN, [facility] Care Management Coordinator [place] Phone: [phone] Fax: [phone]

## 2019-06-06 NOTE — Patient Instructions (Signed)
Visit Information  Goals Addressed            This Visit's Progress   . " My diagnosis scares me" (pt-stated)       Current Barriers:  . Chronic Disease Management support,  care coordination needs related to  ILD  Clinical Goal(s) related to  ILD :  Over the next **14* days, patient will:  . Work with the care management team to address care coordination needs  . Call care management team with questions or concerns . Verbalize basic understanding of patient centered plan of care established today  Interventions related to  ILD :  . Evaluation of current treatment plans and patient's adherence to plan as established by provider . Assessed patient understanding of disease states . Assessed patient's education and care coordination needs . Collaborated with appropriate clinical care team members regarding patient needs . Contact Palliative Care to see when they are planning their initial call.  Patient Self Care Activities related to  ILD :  . Patient is unable to independently self-manage chronic health conditions  Initial goal documentation        Ms. Dingee was given information about Care Management services today including:  1. Care Management services include personalized support from designated clinical staff supervised by her physician, including individualized plan of care and coordination with other care providers 2. 24/7 contact phone numbers for assistance for urgent and routine care needs. 3. The patient may stop CCM services at any time (effective at the end of the month) by phone call to the office staff.  Patient agreed to services and verbal consent obtained.   The patient verbalized understanding of instructions provided today and declined a print copy of patient instruction materials.   The care management team will reach out to the patient again over the next 14 days.  The patient has been provided with contact information for the care management team and  has been advised to call with any health related questions or concerns.   Lazaro Arms RN, BSN, [facility] Care Management Coordinator [place] Phone: [phone] Fax: [phone]

## 2019-06-07 ENCOUNTER — Telehealth: Payer: Self-pay | Admitting: Internal Medicine

## 2019-06-07 NOTE — Telephone Encounter (Signed)
Order was placed 05/09/19 to be evaluated for best-fit portable O2 tanks.  Called spoke with patient who reported that she was scheduled to for the best-fit eval but ended up going to the hosp and being admitted on that appt date.  Patient stated that she called [employer] to reschedule this but was told that we need to place another order.  Advised patient will call.  Called [employer] customer service @ [phone] Levada Dy did not answer) and spoke with Daria.  Per Daria, because patient cancelled her appt with them, the ticket was closed.  She is reopening the ticket and sending to RT to contact pt to reschedule.  Called spoke with patient, informed her of the above.  Patient voiced her understanding and will call the office if she doesn't hear anything within the week.  Nothing further needed; will sign off.

## 2019-06-09 ENCOUNTER — Ambulatory Visit: Payer: Self-pay | Admitting: Licensed Clinical Social Worker

## 2019-06-09 NOTE — Chronic Care Management (AMB) (Signed)
   Social Work  Care Management Collaboration 06/09/2019 Name: Lilyahna Sirmon MRN: [id number] DOB: 05-10-1966  Lacrisha Bielicki is a 54 y.o. year old female who sees Meccariello, Bernita Raisin, DO for primary care. LCSW was consulted along with CCM RN care manager to assistance patient with  Level of Care Concerns for end of life support. Patient was not interviewed or contacted during this encounter however LCSW reviewed chart, notes, insurance and collaborated with CCM Consulting civil engineer .     Recommendation: After collaboration with CCM RN care manager it is determined that patient may benefit from continuum of care and having one contact person on the team.    Intervention:  Review of patient status, including review of consultants reports, relevant laboratory and other test results, and collaboration with appropriate care team members and the patient's provider was performed as part of comprehensive patient evaluation and provision of chronic care management services.    Plan:  1. RN care management team member will follow up with the patient for ongoing assessment of needs from LCSW.    2. If further intervention or needs are Identified RN care manager will contact LCSW to provide interventions.   3. No further follow up required by LCSW at this time   Casimer Lanius, [place] / [place]   [phone] 10:58 AM

## 2019-06-12 ENCOUNTER — Other Ambulatory Visit: Payer: Medicaid Other | Admitting: Hospice

## 2019-06-12 ENCOUNTER — Other Ambulatory Visit: Payer: Self-pay

## 2019-06-12 DIAGNOSIS — Z515 Encounter for palliative care: Secondary | ICD-10-CM

## 2019-06-12 DIAGNOSIS — J849 Interstitial pulmonary disease, unspecified: Secondary | ICD-10-CM

## 2019-06-12 NOTE — Progress Notes (Signed)
  [facility] Consult Note Telephone: [phone]  Fax: [phone]  PATIENT NAME: Holly Mueller DOB: 03/21/1966 MRN: [id number]  PRIMARY CARE PROVIDER:   Meccariello, Bailey J, DO  REFERRING PROVIDER:  Dr. Kehinde Eniola RESPONSIBLE PARTY:  Self [phone]   TELEHEALTH VISIT STATEMENT Due to the COVID-19 crisis, this visit was done via telephone from my office. It was initiated and consented to by this patient and/or family.  RECOMMENDATIONS/PLAN:   Advance Care Planning/Goals of Care: Telehealth Visit consisted of building trust and discussions on Palliative Medicine as specialized medical care for people living with serious illness, aimed at facilitating better quality of life through symptoms relief, assisting with advance care plan and establishing goals of care. Patient affirmed she is a full code; goals of care include to maximize quality of life and symptom management. She said she would be open to Hospice services in the future when it is appropriate.  Symptom management: Fatigue and shortness of breath related  to  worsening pulmonary function/Interstitial Lung Disease. Patient continues on continuous oxygen supplementation 02L/Min. Education provided on using, Spiriva Albuterol breathing treatments as ordered, resting in between activities. Chronic cough managed with Mucinex; patient said her insurance does not cover Tussionex and Tessalon. No complaint of blood in  And hematochezia at this time. Nose bleed is on and off/chronic. Education provided in avoiding dryness in the nares, applying pressure by pinching the nasal ala to help stop bleeding. Given patient's history of low platelet, further education provided on bleeding precautions. Also encouraged adequate fluid intake, fruits and vegetable to prevent constipation which may lead to stool in blood. sShe verbalized understanding and expressed thanks. Chronic.  Follow up:  Palliative care will continue to follow patient for goals of care clarification and symptom management. I spent 60  minutes providing this consultation. Time includes chart review and documentation. More than 50% of the time in this consultation was spent on coordinating communication HISTORY OF PRESENT ILLNESS:  Holly Mueller is a 54 y.o. year old female with multiple medical problems including Interstitial Lung, nodule in R Lung, Type 2 DM.   Palliative Care was asked to help address goals of care.   CODE STATUS: Full  PPS: 60% HOSPICE ELIGIBILITY/DIAGNOSIS: TBD  PAST MEDICAL HISTORY:  Past Medical History:  Diagnosis Date  . Acute respiratory failure with hypoxia (HCC) 09/22/2018  . Anxiety    "anxiety attacks- sometimes"  . Arthritis    bursitis left hip flares-not an issue  . Chronic kidney disease   . Community acquired pneumonia 11/28/2018  . Diabetes mellitus without complication (HCC)    Type II  . Dysrhythmia   . Edentulous    10-19-13 at present  . Epilepsy (HCC) in 1972   No seizures since 1972. Previously treated with phenobarbital.   . Gastric varices with bleeding   . GERD (gastroesophageal reflux disease) 2008  . 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 Headache(784.0)    hx of migraines   . Heart murmur   . 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 (HCC)   . History of blood transfusion    gi bleed  . History of kidney stones    passed  . Hypothyroidism 2008  . Interstitial lung disease (HCC)   . Leukopenia   . Lower esophageal ring 08/18/2013  . Pneumonia   . Primary biliary cholangitis (HCC) 10/24/2018  . Seasonal   allergies 2003  . SEIZURES, HX OF 12/12/2009   Annotation: last seizure in early 1970s Qualifier: Diagnosis of  By: Carlena Sax  MD, Colletta [place]    . Shortness of breath    walking  . Wears glasses     SOCIAL HX:  Social History   Tobacco Use  . Smoking status: Never Smoker  .  Smokeless tobacco: Never Used  . Tobacco comment: 04/25/2019- "I smoke part of a cigarette and put it out,I did not like it."  Substance Use Topics  . Alcohol use: Not Currently    ALLERGIES:  Allergies  Allergen Reactions  . Aspirin Nausea Only and Other (See Comments)    Upset stomach     PERTINENT MEDICATIONS:  Outpatient Encounter Medications as of 06/12/2019  Medication Sig  . Acetaminophen (APAP) 325 MG tablet Take 2 tablets (650 mg total) by mouth every 6 (six) hours as needed.  Marland Kitchen albuterol (PROAIR HFA) 108 (90 Base) MCG/ACT inhaler INHALE 2 PUFFS INTO THE LUNGS EVERY 6 HOURS AS NEEDED FOR SHORTNESS OF BREATH (Patient taking differently: Inhale 2 puffs into the lungs every 4 (four) hours as needed for wheezing or shortness of breath. )  . albuterol (PROVENTIL) (2.5 MG/3ML) 0.083% nebulizer solution Take 3 mLs (2.5 mg total) by nebulization every 6 (six) hours as needed for wheezing or shortness of breath.  . blood glucose meter kit and supplies Dispense based on patient and insurance preference. Use up to four times daily as directed. (FOR ICD-10 E10.9, E11.9).  . budesonide-formoterol (SYMBICORT) 80-4.5 MCG/ACT inhaler Inhale 2 puffs into the lungs 2 (two) times daily for 1 day.  . cetirizine (ZYRTEC) 10 MG tablet Take 1 tablet (10 mg total) by mouth daily.  . diclofenac Sodium (VOLTAREN) 1 % GEL Apply 4 g topically 4 (four) times daily as needed (pain).  . fluticasone (FLONASE) 50 MCG/ACT nasal spray Place 2 sprays into both nostrils daily.  Marland Kitchen glucose blood (TRUE METRIX BLOOD GLUCOSE TEST) test strip Use as instructed  . levothyroxine (SYNTHROID) 125 MCG tablet TAKE 2 TABLETS (250 MCG TOTAL) BY MOUTH DAILY. (Patient taking differently: Take 125 mcg by mouth 2 (two) times daily. )  . metFORMIN (GLUCOPHAGE) 500 MG tablet Take 1 tablet (500 mg total) by mouth 2 (two) times daily with a meal.  . OXYGEN Inhale 2 L into the lungs continuous.   . pantoprazole (PROTONIX) 40 MG tablet Take 1  tablet (40 mg total) by mouth 2 (two) times daily before a meal.  . SPIRIVA HANDIHALER 18 MCG inhalation capsule INHALE 1 CAPSULE VIA HANDIHALER ONCE DAILY AT THE SAME TIME EVERY DAY (Patient taking differently: Place 18 mcg into inhaler and inhale daily. )  . TRUEplus Lancets 28G MISC Use to check blood sugar as directed.  . ursodiol (ACTIGALL) 300 MG capsule Take 2 capsules (600 mg total) by mouth 3 (three) times daily.   No facility-administered encounter medications on file as of 06/12/2019.    Teodoro Spray, NP

## 2019-06-14 ENCOUNTER — Ambulatory Visit: Payer: Medicaid Other | Admitting: Family Medicine

## 2019-06-14 ENCOUNTER — Ambulatory Visit: Payer: Self-pay

## 2019-06-14 ENCOUNTER — Other Ambulatory Visit: Payer: Self-pay

## 2019-06-14 ENCOUNTER — Ambulatory Visit: Payer: Medicaid Other

## 2019-06-14 NOTE — Chronic Care Management (AMB) (Signed)
  Care Management   Outreach Note  06/14/2019 Name: Genia Perin MRN: [id number] DOB: [birth year]  Referred by: Cleophas Dunker, DO Reason for referral : Care Coordination (Care Management RNCM ILD)   An unsuccessful telephone outreach was attempted today. The patient was referred to the case management team by for assistance with care management and care coordination.   Follow Up Plan: A HIPPA compliant phone message was left for the patient providing contact information and requesting a return call.  The care management team will reach out to the patient again over the next 7 days.   Lazaro Arms RN, BSN, [facility] Care Management Coordinator [place] Phone: [phone] Fax: [phone]

## 2019-06-14 NOTE — Chronic Care Management (AMB) (Addendum)
Care Management   Follow Up Note   06/14/2019 Name: Holly Mueller MRN: [id number] DOB: [birth year]  Referred by: Cleophas Dunker, DO Reason for referral : Care Coordination (Care Management RNCM ILD)   Holly Mueller is a 54 y.o. year old female who is a primary care patient of Cleophas Dunker, DO. The care management team was consulted for assistance with care management and care coordination needs.    Review of patient status, including review of consultants reports, relevant laboratory and other test results, and collaboration with appropriate care team members and the patient's provider was performed as part of comprehensive patient evaluation and provision of chronic care management services.    SDOH (Social Determinants of Health) screening performed today: None. See Care Plan for related entries.   Advanced Directives: See Care Plan and Vynca application for related entries.   Goals Addressed            This Visit's Progress   . " My diagnosis scares me" (pt-stated)       Current Barriers:  . Chronic Disease Management support,  care coordination needs related to  ILD  Clinical Goal(s) related to  ILD :  Over the next **14* days, patient will:  . Work with the care management team to address care coordination needs  . Call care management team with questions or concerns . Verbalize basic understanding of patient centered plan of care established today  Interventions related to  ILD :  . Evaluation of current treatment plans and patient's adherence to plan as established by provider . Assessed patient understanding of disease states . Assessed patient's education and care coordination needs . Collaborated with appropriate clinical care team members regarding patient needs . Contact Palliative Care to see when they are planning their initial call.  . 06/14/19 . Spoke with the patient and she states that she has talked with palliative care.  The y  discussed her condition, what they do and about Hospice when she needs it.  She states they told her they will follow back up with her in March. . The patient denies need for any help with transportation, an in home aid, a cane or walker ( she states her brother has a walker if she needs it). . The patient states that she is on oxygen with [employer] and on the waiting list for a small concentrator.  She currently has the small portable tanks that she can carry in a should pack but feels they are to heavy.  I spoke with Darlina Guys today with [employer] and she states the small concentrators are on back order due to covid and they are not sure when they will have them. .  Patient states that for about 2 weeks she has been having problems with her BM's.  She states that they have been hard and she has seen some red blood when she wipes.  She has tried stool softeners and drinking plenty of water and states nothing has helped.  Also,  she states her left leg gives her trouble at night.  It does not hurt if she walks or sits only when she lays down.  She rates the pain 8/10.  She will rub it with the diclofenac 1% gel but it does not relieve it.  She wants to see if she can get something else for her leg. She has an office visit scheduled 06/16/19@ 230.  She was asked Covid questions and responded no to all.  Patient Self Care Activities related to  ILD :  . Patient is unable to independently self-manage chronic health conditions  Please see past updates related to this goal by clicking on the "Past Updates" button in the selected goal          The care management team will reach out to the patient again over the next 14 days.  The patient has been provided with contact information for the care management team and has been advised to call with any health related questions or concerns.   Lazaro Arms RN, BSN, [facility] Care Management Coordinator [place] Phone: [phone] Fax:  [phone]

## 2019-06-14 NOTE — Patient Instructions (Signed)
Visit Information  Goals Addressed            This Visit's Progress   . " My diagnosis scares me" (pt-stated)       Current Barriers:  . Chronic Disease Management support,  care coordination needs related to  ILD  Clinical Goal(s) related to  ILD :  Over the next **14* days, patient will:  . Work with the care management team to address care coordination needs  . Call care management team with questions or concerns . Verbalize basic understanding of patient centered plan of care established today  Interventions related to  ILD :  . Evaluation of current treatment plans and patient's adherence to plan as established by provider . Assessed patient understanding of disease states . Assessed patient's education and care coordination needs . Collaborated with appropriate clinical care team members regarding patient needs . Contact Palliative Care to see when they are planning their initial call.  . 06/14/19 . Spoke with the patient and she states that she has talked with palliative care.  The y discussed her condition, what they do and about Hospice when she needs it.  She states they told her they will follow back up with her in March. . The patient denies need for any help with transportation, an in home aid, a cane or walker ( she states her brother has a walker if she needs it). . The patient states that she is on oxygen with [employer] and on the waiting list for a small concentrator.  She currently has the small portable tanks that she can carry in a should pack but feels they are to heavy.  I spoke with Darlina Guys today with [employer] and she states the small concentrators are on back order due to covid and they are not sure when they will have them. .  Patient states that for about 2 weeks she has been having problems with her BM's.  She states that they have been hard and she has seen some red blood when she wipes.  She has tried stool softeners and drinking plenty of water and states  nothing has helped.  Also,  she states her left leg gives her trouble at night.  It does not hurt if she walks or sits only when she lays down.  She rates the pain 8/10.  She will rub it with the diclofenac 1% gel but it does not relieve it.  She wants to see if she can get something else for her leg. She has an office visit scheduled 06/16/19@ 230.  She was asked Covid questions and responded no to all.   Patient Self Care Activities related to  ILD :  . Patient is unable to independently self-manage chronic health conditions  Please see past updates related to this goal by clicking on the "Past Updates" button in the selected goal         Ms. Dickison was given information about Care Management services today including:  1. Care Management services include personalized support from designated clinical staff supervised by her physician, including individualized plan of care and coordination with other care providers 2. 24/7 contact phone numbers for assistance for urgent and routine care needs. 3. The patient may stop CCM services at any time (effective at the end of the month) by phone call to the office staff.  Patient agreed to services and verbal consent obtained.   The patient verbalized understanding of instructions provided today and declined a print copy  of patient instruction materials.   The care management team will reach out to the patient again over the next 14 days.  The patient has been provided with contact information for the care management team and has been advised to call with any health related questions or concerns.   Lazaro Arms RN, BSN, [facility] Care Management Coordinator [place] Phone: [phone] Fax: [phone]

## 2019-06-15 ENCOUNTER — Telehealth: Payer: Self-pay

## 2019-06-15 NOTE — Telephone Encounter (Signed)
LVM for pt to call our office. If pt calls, please ask COVID screening questions. Ottis Stain, CMA

## 2019-06-16 ENCOUNTER — Encounter: Payer: Self-pay | Admitting: Family Medicine

## 2019-06-16 ENCOUNTER — Ambulatory Visit (INDEPENDENT_AMBULATORY_CARE_PROVIDER_SITE_OTHER): Payer: Medicaid Other | Admitting: Family Medicine

## 2019-06-16 ENCOUNTER — Other Ambulatory Visit: Payer: Self-pay

## 2019-06-16 VITALS — BP 100/56 | HR 86 | Wt 245.0 lb

## 2019-06-16 DIAGNOSIS — K625 Hemorrhage of anus and rectum: Secondary | ICD-10-CM | POA: Diagnosis present

## 2019-06-16 DIAGNOSIS — M7632 Iliotibial band syndrome, left leg: Secondary | ICD-10-CM

## 2019-06-16 DIAGNOSIS — J849 Interstitial pulmonary disease, unspecified: Secondary | ICD-10-CM | POA: Diagnosis not present

## 2019-06-16 LAB — HEMOCCULT GUIAC POC 1CARD (OFFICE): Fecal Occult Blood, POC: POSITIVE — AB

## 2019-06-16 LAB — POCT HEMOGLOBIN: Hemoglobin: 8.8 g/dL — AB (ref 11–14.6)

## 2019-06-16 MED FILL — LEVOTHYROXINE 125 MCG TAB: 125 | 30 days supply | Qty: 60 | Fill #1

## 2019-06-16 MED FILL — metFORMIN HCL 500 MG TABS: 500 | 30 days supply | Qty: 60 | Fill #2

## 2019-06-16 MED FILL — PANTOPRAZOLE SOD DR 40 MG T: 40 | 30 days supply | Qty: 60 | Fill #3

## 2019-06-16 NOTE — Patient Instructions (Signed)
Thank you for coming to see me today. It was a pleasure. Today we talked about:   You have tightness of your IT band.  Use a roller to rub the area at least three times a day and do some stretches below that you are able to do.  Use miralax and senna daily for your hard stools.  You have hemorrhoids.  Use warm baths like below and you can also get cream such as Preparation H if you can pain to use.  Please follow-up with me in 1 month or sooner as needed  If you have any questions or concerns, please do not hesitate to call the office at [phone].  Best,   [place] Constable, DO    Iliotibial Band Syndrome  Iliotibial band syndrome (ITBS) is a condition that often causes knee pain. It can also cause pain in the outside of your hip, thigh, and knee. The iliotibial band is a strip of tissue that runs from the outside of your hip and down your thigh to the outside of your knee. Repeatedly bending and straightening your knee can irritate the iliotibial band. What are the causes? This condition is caused by inflammation and irritation from the friction of the iliotibial band moving over the thigh bone (femur) when you repeatedly bend and straighten your knee. What increases the risk? This condition is more likely to develop in people who:  Frequently change elevation during their workouts.  Run very long distances.  Recently increased the length or intensity of their workouts.  Run downhill often, or just started running downhill.  Ride a bike very far or often. You may also be at greater risk if you start a new workout routine without first warming up or if you have a job that requires you to bend, squat, or climb frequently. What are the signs or symptoms? Symptoms of this condition include:  Pain along the outside of your knee that may be worse with activity, especially running or going up and down stairs.  A "snapping" sensation over your knee.  Swelling on the outside  of your knee.  Pain or a feeling of tightness in your hip. How is this diagnosed? This condition is diagnosed based on your symptoms, medical history, and physical exam. You may also see a health care provider who specializes in reducing pain and increasing mobility (physical therapist). A physical therapist may do an exam to check your balance, movement, and way of walking or running (gait) to see whether the way you move could contribute to your injury. You may also have tests to measure your strength, flexibility, and range of motion. How is this treated? Treatment for this condition includes:  Resting and limiting exercise.  Returning to activities gradually.  Doing range-of-motion and strengthening exercises (physical therapy) as told by your health care provider.  Including low-impact activities, such as swimming, in your exercise routine. Follow these instructions at home:  If directed, apply ice to the injured area. ? Put ice in a plastic bag. ? Place a towel between your skin and the bag. ? Leave the ice on for 20 minutes, 2-3 times per day.  Return to your normal activities as told by your health care provider. Ask your health care provider what activities are safe for you.  Keep all follow-up visits with your health care provider. This is important. Contact a health care provider if:  Your pain does not improve or gets worse despite treatment. This information is not intended to  replace advice given to you by your health care provider. Make sure you discuss any questions you have with your health care provider. Document Revised: 04/09/2017 Document Reviewed: 05/29/2016 Elsevier Patient Education  2020 [employer].    Hemorrhoids Hemorrhoids are swollen veins that may develop:  In the butt (rectum). These are called internal hemorrhoids.  Around the opening of the butt (anus). These are called external hemorrhoids. Hemorrhoids can cause pain, itching, or bleeding.  Most of the time, they do not cause serious problems. They usually get better with diet changes, lifestyle changes, and other home treatments. What are the causes? This condition may be caused by:  Having trouble pooping (constipation).  Pushing hard (straining) to poop.  Watery poop (diarrhea).  Pregnancy.  Being very overweight (obese).  Sitting for long periods of time.  Heavy lifting or other activity that causes you to strain.  Anal sex.  Riding a bike for a long period of time. What are the signs or symptoms? Symptoms of this condition include:  Pain.  Itching or soreness in the butt.  Bleeding from the butt.  Leaking poop.  Swelling in the area.  One or more lumps around the opening of your butt. How is this diagnosed? A doctor can often diagnose this condition by looking at the affected area. The doctor may also:  Do an exam that involves feeling the area with a gloved hand (digital rectal exam).  Examine the area inside your butt using a small tube (anoscope).  Order blood tests. This may be done if you have lost a lot of blood.  Have you get a test that involves looking inside the colon using a flexible tube with a camera on the end (sigmoidoscopy or colonoscopy). How is this treated? This condition can usually be treated at home. Your doctor may tell you to change what you eat, make lifestyle changes, or try home treatments. If these do not help, procedures can be done to remove the hemorrhoids or make them smaller. These may involve:  Placing rubber bands at the base of the hemorrhoids to cut off their blood supply.  Injecting medicine into the hemorrhoids to shrink them.  Shining a type of light energy onto the hemorrhoids to cause them to fall off.  Doing surgery to remove the hemorrhoids or cut off their blood supply. Follow these instructions at home: Eating and drinking   Eat foods that have a lot of fiber in them. These include whole  grains, beans, nuts, fruits, and vegetables.  Ask your doctor about taking products that have added fiber (fibersupplements).  Reduce the amount of fat in your diet. You can do this by: ? Eating low-fat dairy products. ? Eating less red meat. ? Avoiding processed foods.  Drink enough fluid to keep your pee (urine) pale yellow. Managing pain and swelling   Take a warm-water bath (sitz bath) for 20 minutes to ease pain. Do this 3-4 times a day. You may do this in a bathtub or using a portable sitz bath that fits over the toilet.  If told, put ice on the painful area. It may be helpful to use ice between your warm baths. ? Put ice in a plastic bag. ? Place a towel between your skin and the bag. ? Leave the ice on for 20 minutes, 2-3 times a day. General instructions  Take over-the-counter and prescription medicines only as told by your doctor. ? Medicated creams and medicines may be used as told.  Exercise often.  Ask your doctor how much and what kind of exercise is best for you.  Go to the bathroom when you have the urge to poop. Do not wait.  Avoid pushing too hard when you poop.  Keep your butt dry and clean. Use wet toilet paper or moist towelettes after pooping.  Do not sit on the toilet for a long time.  Keep all follow-up visits as told by your doctor. This is important. Contact a doctor if you:  Have pain and swelling that do not get better with treatment or medicine.  Have trouble pooping.  Cannot poop.  Have pain or swelling outside the area of the hemorrhoids. Get help right away if you have:  Bleeding that will not stop. Summary  Hemorrhoids are swollen veins in the butt or around the opening of the butt.  They can cause pain, itching, or bleeding.  Eat foods that have a lot of fiber in them. These include whole grains, beans, nuts, fruits, and vegetables.  Take a warm-water bath (sitz bath) for 20 minutes to ease pain. Do this 3-4 times a day. This  information is not intended to replace advice given to you by your health care provider. Make sure you discuss any questions you have with your health care provider. Document Revised: 05/05/2018 Document Reviewed: 09/16/2017 Elsevier Patient Education  [employer] [place].

## 2019-06-16 NOTE — Assessment & Plan Note (Addendum)
Anoscopy with evidence of internal hemorrhoid.  FOBT positive.  Point-of-care hemoglobin 8.8, stable with patient's chronic known pancytopenia.  Cannot find where patient has had a colonoscopy previously.  Have messaged her chronic care management RN to see if we can get this scheduled.  She does follow with GI, but appears to have only had upper endoscopies.  Advised patient can use MiraLAX and senna over-the-counter for constipation to avoid straining.  Also advised increasing fiber intake.  Given information on hemorrhoids including sitz bath's and topical creams to use if pain.  She voiced understanding.

## 2019-06-16 NOTE — Assessment & Plan Note (Signed)
Exam consistent with tight IT band on left.  Also consistent with her history.  Discussed stretching for IT band syndrome with patient.  Provided her with some images.  Advised her to only do stretches that she is comfortable performing.  Advised to use foam roller or a rolling pin to rule out her IT band at least 3 times a day.  Come back if no improvement.

## 2019-06-16 NOTE — Progress Notes (Signed)
CHIEF COMPLAINT / HPI:  1 Left Leg Pain When she is lying down and getting ready to sleep.  Has pain from in left hip that extends into left ankle.  "it's like it can't relax."  No pain in the right leg.  Has used voltaren gel and states that it takes hours before she can even sleep because of the pain.  Has no pain with walking, only at night time.  Happening every night for over a month, was happening before she was in the hospital the last time.  No numbness and tingling.  Feels like "after when you work out really hard for the first time in a while and your muscles can't relax."  Gets up and walks around in the house.  Has tried heat and ice but neither help.  2 Hard Stools Has a difficult time trying to poop for about a month or so, was before she was in the hospital, but has been worse since being in the hospital.  She is straining and states that she pushes so hard that sometimes she sees a little bit of blood.  Seeing blood in the tissue almost every time she is pooping.  Sometimes will even see blood when she wipes when she pees.  Denies pain with urination and thinks the blood is coming from her bottom.  She denies abdominal pain or bloating.  Has intense pain when she is trying to get the stool out.  Sometimes she thinks the edge of her rectum feels bumpy when she uses vaseline to help with the pain.  Hurts to sit down sometimes after going to the bathroom.   Was taking a [employer] stool softener but wasn't helping.  Stool is brown in color.  [place] stool chart 3-4.  3 ILD Recently admitted, found to have worsening symptoms.  Seen by palliative care in hospital as well as outpatient follow-up.  Patient is still a full code at this time.  Hospice will continue to follow. Breathing intermittently worsens but doing okay right now.  Following with pulmnology.  PERTINENT  PMH / PSH: Interstitial lung disease, gastric varices with bleeding, chronic pancytopenia, primary biliary  cholangitis   OBJECTIVE: BP (!) 100/56   Pulse 86   Wt 245 lb (111.1 kg)   LMP  (LMP Unknown)   SpO2 94%   BMI 40.15 kg/m    Physical Exam:  General: 54 y.o. female in NAD Lungs: Breathing comfortably on 2 L O2 per [place] Abdomen: Soft, non-tender to palpation Skin: warm and dry Rectal exam: 1 exterior skin tag at 5:00, anoscopy performed, internal hemorrhoids visualized, procedure aborted due to pain MSK: Tenderness to palpation along left IT band, no tenderness to palpation of left greater trochanter specifically, good range of motion at left hip and left knee, no tenderness to palpation of right IT band or right greater trochanter Neuro: Sensation intact throughout bilateral lower extremities  Results for orders placed or performed in visit on 06/16/19 (from the past 24 hour(s))  Hemoccult - 1 Card (office)     Status: Abnormal   Collection Time: 06/16/19  3:18 PM  Result Value Ref Range   Fecal Occult Blood, POC Positive (A) Negative   Card #1 Date 06/16/2019   Hemoglobin     Status: Abnormal   Collection Time: 06/16/19  3:28 PM  Result Value Ref Range   Hemoglobin 8.8 (A) 11 - 14.6 g/dL    ASSESSMENT / PLAN:  It band syndrome, left Exam consistent with tight  IT band on left.  Also consistent with her history.  Discussed stretching for IT band syndrome with patient.  Provided her with some images.  Advised her to only do stretches that she is comfortable performing.  Advised to use foam roller or a rolling pin to rule out her IT band at least 3 times a day.  Come back if no improvement.  Rectal bleeding Anoscopy with evidence of internal hemorrhoid.  FOBT positive.  Point-of-care hemoglobin 8.8, stable with patient's chronic known pancytopenia.  Cannot find where patient has had a colonoscopy previously.  Have messaged her chronic care management RN to see if we can get this scheduled.  She does follow with GI, but appears to have only had upper endoscopies.  Advised patient  can use MiraLAX and senna over-the-counter for constipation to avoid straining.  Also advised increasing fiber intake.  Given information on hemorrhoids including sitz bath's and topical creams to use if pain.  She voiced understanding.  ILD (interstitial lung disease) (HCC) Chronic and stable.  Follows with pulmonology.  Hospice continues to follow patient.  Will have patient follow-up in 1 month for chronic care.     Cleophas Dunker, [place]

## 2019-06-16 NOTE — Assessment & Plan Note (Signed)
Chronic and stable.  Follows with pulmonology.  Hospice continues to follow patient.  Will have patient follow-up in 1 month for chronic care.

## 2019-06-19 ENCOUNTER — Ambulatory Visit: Payer: Self-pay

## 2019-06-19 NOTE — Chronic Care Management (AMB) (Signed)
  Care Management   Outreach Note  06/19/2019 Name: Holly Mueller MRN: [id number] DOB: 06/12/1965  Referred by: Cleophas Dunker, DO Reason for referral : Care Coordination (Care Management RNCM GI appointment)   An unsuccessful telephone outreach was attempted today. The patient was referred to the case management team for assistance with care management and care coordination.   Follow Up Plan: A HIPPA compliant phone message was left for the patient providing contact information and requesting a return call.  The care management team will reach out to the patient again over the next 5-7 days.   Lazaro Arms RN, BSN, [facility] Care Management Coordinator [place] Phone: [phone] Fax: [phone]

## 2019-06-22 ENCOUNTER — Ambulatory Visit: Payer: Medicaid Other

## 2019-06-22 ENCOUNTER — Other Ambulatory Visit: Payer: Self-pay

## 2019-06-22 NOTE — Chronic Care Management (AMB) (Signed)
  Care Management   Outreach Note  06/22/2019 Name: Holly Mueller MRN: [id number] DOB: 10/31/1965  Referred by: Cleophas Dunker, DO Reason for referral : Care Coordination (Care Management RNCM  GI appointment)   An unsuccessful telephone outreach was attempted today. The patient was referred to the case management team for assistance with care management and care coordination.   Follow Up Plan: A HIPPA compliant phone message was left for the patient providing contact information and requesting a return call.  The care management team will reach out to the patient again over the next 5-7 days.   Lazaro Arms RN, BSN, [facility] Care Management Coordinator [place] Phone: [phone] Fax: [phone]

## 2019-06-26 ENCOUNTER — Telehealth: Payer: Medicaid Other

## 2019-06-26 ENCOUNTER — Other Ambulatory Visit (HOSPITAL_COMMUNITY): Payer: Self-pay

## 2019-06-26 ENCOUNTER — Other Ambulatory Visit: Payer: Self-pay

## 2019-06-26 ENCOUNTER — Ambulatory Visit (INDEPENDENT_AMBULATORY_CARE_PROVIDER_SITE_OTHER): Payer: Medicaid Other | Admitting: Adult Health

## 2019-06-26 ENCOUNTER — Encounter: Payer: Self-pay | Admitting: Adult Health

## 2019-06-26 ENCOUNTER — Encounter (HOSPITAL_COMMUNITY): Payer: Self-pay | Admitting: Emergency Medicine

## 2019-06-26 ENCOUNTER — Emergency Department (HOSPITAL_COMMUNITY): Payer: Medicaid Other

## 2019-06-26 ENCOUNTER — Inpatient Hospital Stay (HOSPITAL_COMMUNITY)
Admission: EM | Admit: 2019-06-26 | Discharge: 2019-06-28 | DRG: 196 | Disposition: A | Discharge status: Home Health | Payer: Medicaid Other | Attending: Family Medicine | Admitting: Family Medicine

## 2019-06-26 DIAGNOSIS — I8510 Secondary esophageal varices without bleeding: Secondary | ICD-10-CM | POA: Diagnosis present

## 2019-06-26 DIAGNOSIS — E669 Obesity, unspecified: Secondary | ICD-10-CM | POA: Diagnosis present

## 2019-06-26 DIAGNOSIS — F329 Major depressive disorder, single episode, unspecified: Secondary | ICD-10-CM | POA: Diagnosis present

## 2019-06-26 DIAGNOSIS — R0602 Shortness of breath: Secondary | ICD-10-CM

## 2019-06-26 DIAGNOSIS — E1122 Type 2 diabetes mellitus with diabetic chronic kidney disease: Secondary | ICD-10-CM | POA: Diagnosis present

## 2019-06-26 DIAGNOSIS — Z515 Encounter for palliative care: Secondary | ICD-10-CM

## 2019-06-26 DIAGNOSIS — Z79899 Other long term (current) drug therapy: Secondary | ICD-10-CM

## 2019-06-26 DIAGNOSIS — Z833 Family history of diabetes mellitus: Secondary | ICD-10-CM

## 2019-06-26 DIAGNOSIS — J849 Interstitial pulmonary disease, unspecified: Secondary | ICD-10-CM | POA: Diagnosis not present

## 2019-06-26 DIAGNOSIS — K5900 Constipation, unspecified: Secondary | ICD-10-CM | POA: Diagnosis present

## 2019-06-26 DIAGNOSIS — K649 Unspecified hemorrhoids: Secondary | ICD-10-CM | POA: Diagnosis present

## 2019-06-26 DIAGNOSIS — Z888 Allergy status to other drugs, medicaments and biological substances status: Secondary | ICD-10-CM

## 2019-06-26 DIAGNOSIS — K743 Primary biliary cirrhosis: Secondary | ICD-10-CM | POA: Diagnosis not present

## 2019-06-26 DIAGNOSIS — J984 Other disorders of lung: Secondary | ICD-10-CM

## 2019-06-26 DIAGNOSIS — J84112 Idiopathic pulmonary fibrosis: Principal | ICD-10-CM | POA: Diagnosis present

## 2019-06-26 DIAGNOSIS — Z87442 Personal history of urinary calculi: Secondary | ICD-10-CM

## 2019-06-26 DIAGNOSIS — K219 Gastro-esophageal reflux disease without esophagitis: Secondary | ICD-10-CM | POA: Diagnosis present

## 2019-06-26 DIAGNOSIS — K7460 Unspecified cirrhosis of liver: Secondary | ICD-10-CM | POA: Diagnosis present

## 2019-06-26 DIAGNOSIS — Z20822 Contact with and (suspected) exposure to covid-19: Secondary | ICD-10-CM | POA: Diagnosis present

## 2019-06-26 DIAGNOSIS — Z7189 Other specified counseling: Secondary | ICD-10-CM | POA: Diagnosis not present

## 2019-06-26 DIAGNOSIS — J449 Chronic obstructive pulmonary disease, unspecified: Secondary | ICD-10-CM | POA: Diagnosis present

## 2019-06-26 DIAGNOSIS — R0609 Other forms of dyspnea: Secondary | ICD-10-CM

## 2019-06-26 DIAGNOSIS — Z8701 Personal history of pneumonia (recurrent): Secondary | ICD-10-CM

## 2019-06-26 DIAGNOSIS — J9621 Acute and chronic respiratory failure with hypoxia: Secondary | ICD-10-CM | POA: Diagnosis present

## 2019-06-26 DIAGNOSIS — N189 Chronic kidney disease, unspecified: Secondary | ICD-10-CM | POA: Diagnosis present

## 2019-06-26 DIAGNOSIS — Z7951 Long term (current) use of inhaled steroids: Secondary | ICD-10-CM

## 2019-06-26 DIAGNOSIS — Z7984 Long term (current) use of oral hypoglycemic drugs: Secondary | ICD-10-CM

## 2019-06-26 DIAGNOSIS — E039 Hypothyroidism, unspecified: Secondary | ICD-10-CM | POA: Diagnosis present

## 2019-06-26 DIAGNOSIS — D72819 Decreased white blood cell count, unspecified: Secondary | ICD-10-CM | POA: Diagnosis present

## 2019-06-26 DIAGNOSIS — J9611 Chronic respiratory failure with hypoxia: Secondary | ICD-10-CM

## 2019-06-26 DIAGNOSIS — J8410 Pulmonary fibrosis, unspecified: Secondary | ICD-10-CM | POA: Diagnosis not present

## 2019-06-26 DIAGNOSIS — J441 Chronic obstructive pulmonary disease with (acute) exacerbation: Secondary | ICD-10-CM | POA: Diagnosis present

## 2019-06-26 DIAGNOSIS — Z9981 Dependence on supplemental oxygen: Secondary | ICD-10-CM

## 2019-06-26 DIAGNOSIS — Z6839 Body mass index (BMI) 39.0-39.9, adult: Secondary | ICD-10-CM

## 2019-06-26 DIAGNOSIS — R0600 Dyspnea, unspecified: Secondary | ICD-10-CM

## 2019-06-26 DIAGNOSIS — D61818 Other pancytopenia: Secondary | ICD-10-CM | POA: Diagnosis present

## 2019-06-26 DIAGNOSIS — Z79890 Hormone replacement therapy (postmenopausal): Secondary | ICD-10-CM

## 2019-06-26 LAB — BASIC METABOLIC PANEL
Anion gap: 8 (ref 5–15)
BUN: 8 mg/dL (ref 6–20)
CO2: 19 mmol/L — ABNORMAL LOW (ref 22–32)
Calcium: 8.7 mg/dL — ABNORMAL LOW (ref 8.9–10.3)
Chloride: 109 mmol/L (ref 98–111)
Creatinine, Ser: 0.64 mg/dL (ref 0.44–1.00)
GFR calc Af Amer: >60 mL/min (ref >60)
GFR calc non Af Amer: >60 mL/min (ref >60)
Glucose, Bld: 133 mg/dL — ABNORMAL HIGH (ref 70–99)
Potassium: 3.9 mmol/L (ref 3.5–5.1)
Sodium: 136 mmol/L (ref 135–145)

## 2019-06-26 LAB — POCT I-STAT 7, (LYTES, BLD GAS, ICA,H+H)
Acid-base deficit: 4 mmol/L — ABNORMAL HIGH (ref 0.0–2.0)
Bicarbonate: 19.6 mmol/L — ABNORMAL LOW (ref 20.0–28.0)
Calcium, Ion: 1.22 mmol/L (ref 1.15–1.40)
HCT: 31 % — ABNORMAL LOW (ref 36.0–46.0)
Hemoglobin: 10.5 g/dL — ABNORMAL LOW (ref 12.0–15.0)
O2 Saturation: 98 %
Patient temperature: 98.1
Potassium: 4.1 mmol/L (ref 3.5–5.1)
Sodium: 141 mmol/L (ref 135–145)
TCO2: 21 mmol/L — ABNORMAL LOW (ref 22–32)
pCO2 arterial: 31.5 mmHg — ABNORMAL LOW (ref 32.0–48.0)
pH, Arterial: 7.401 (ref 7.350–7.450)
pO2, Arterial: 95 mmHg (ref 83.0–108.0)

## 2019-06-26 LAB — I-STAT BETA HCG BLOOD, ED (MC, WL, AP ONLY): I-stat hCG, quantitative: <5 m[IU]/mL (ref <5)

## 2019-06-26 LAB — CBC
HCT: 29.2 % — ABNORMAL LOW (ref 36.0–46.0)
HCT: 29.4 % — ABNORMAL LOW (ref 36.0–46.0)
Hemoglobin: 8.8 g/dL — ABNORMAL LOW (ref 12.0–15.0)
Hemoglobin: 9.1 g/dL — ABNORMAL LOW (ref 12.0–15.0)
MCH: 26 pg (ref 26.0–34.0)
MCH: 26.5 pg (ref 26.0–34.0)
MCHC: 30.1 g/dL (ref 30.0–36.0)
MCHC: 31 g/dL (ref 30.0–36.0)
MCV: 86.1 fL (ref 80.0–100.0)
MCV: 85.5 fL (ref 80.0–100.0)
Platelets: 62 10*3/uL — ABNORMAL LOW (ref 150–400)
Platelets: 62 K/uL — ABNORMAL LOW (ref 150–400)
RBC: 3.39 MIL/uL — ABNORMAL LOW (ref 3.87–5.11)
RBC: 3.44 MIL/uL — ABNORMAL LOW (ref 3.87–5.11)
RDW: 15.2 % (ref 11.5–15.5)
RDW: 15.2 % (ref 11.5–15.5)
WBC: 1.1 10*3/uL — CL (ref 4.0–10.5)
WBC: 1 K/uL — CL (ref 4.0–10.5)
nRBC: 0 % (ref 0.0–0.2)
nRBC: 0 % (ref 0.0–0.2)

## 2019-06-26 LAB — CREATININE, SERUM
Creatinine, Ser: 0.6 mg/dL (ref 0.44–1.00)
GFR calc Af Amer: >60 mL/min
GFR calc non Af Amer: >60 mL/min

## 2019-06-26 LAB — GLUCOSE, CAPILLARY: Glucose-Capillary: 300 mg/dL — ABNORMAL HIGH (ref 70–99)

## 2019-06-26 LAB — BRAIN NATRIURETIC PEPTIDE: B Natriuretic Peptide: 27.6 pg/mL (ref 0.0–100.0)

## 2019-06-26 LAB — SARS CORONAVIRUS 2 (TAT 6-24 HRS): SARS Coronavirus 2: NEGATIVE

## 2019-06-26 LAB — POC SARS CORONAVIRUS 2 AG -  ED
SARS Coronavirus 2 Ag: NEGATIVE
SARS Coronavirus 2 Ag: NEGATIVE

## 2019-06-26 LAB — TROPONIN I (HIGH SENSITIVITY)
Troponin I (High Sensitivity): 3 ng/L (ref <18)
Troponin I (High Sensitivity): 3 ng/L (ref <18)

## 2019-06-26 MED ORDER — METFORMIN HCL 500 MG PO TABS
500.0000 mg | ORAL_TABLET | Freq: Two times a day (BID) | ORAL | Status: DC
  Administered 2019-06-27 – 2019-06-28 (×3): 500 mg via ORAL
  Filled 2019-06-26 (×3): qty 1

## 2019-06-26 MED ORDER — IPRATROPIUM-ALBUTEROL 0.5-2.5 (3) MG/3ML IN SOLN
3.0000 mL | RESPIRATORY_TRACT | Status: DC
  Administered 2019-06-26: 3 mL via RESPIRATORY_TRACT

## 2019-06-26 MED ORDER — IPRATROPIUM-ALBUTEROL 0.5-2.5 (3) MG/3ML IN SOLN
3.0000 mL | Freq: Three times a day (TID) | RESPIRATORY_TRACT | Status: DC
  Administered 2019-06-27 – 2019-06-28 (×4): 3 mL via RESPIRATORY_TRACT
  Filled 2019-06-26 (×4): qty 3

## 2019-06-26 MED ORDER — AZITHROMYCIN 250 MG PO TABS
500.0000 mg | ORAL_TABLET | Freq: Every day | ORAL | Status: AC
  Administered 2019-06-26: 500 mg via ORAL
  Filled 2019-06-26: qty 2

## 2019-06-26 MED ORDER — ALBUTEROL SULFATE HFA 108 (90 BASE) MCG/ACT IN AERS
8.0000 | INHALATION_SPRAY | Freq: Once | RESPIRATORY_TRACT | Status: AC
  Administered 2019-06-26: 15:00:00 8 via RESPIRATORY_TRACT
  Filled 2019-06-26: qty 6.7

## 2019-06-26 MED ORDER — ACETAMINOPHEN 650 MG RE SUPP: 650.0000 mg | Freq: Four times a day (QID) | RECTAL | Status: DC | PRN

## 2019-06-26 MED ORDER — SENNA 8.6 MG PO TABS
1.0000 | ORAL_TABLET | Freq: Two times a day (BID) | ORAL | Status: DC
  Administered 2019-06-26 – 2019-06-28 (×4): 8.6 mg via ORAL
  Filled 2019-06-26 (×4): qty 1

## 2019-06-26 MED ORDER — PANTOPRAZOLE SODIUM 40 MG PO TBEC
40.0000 mg | DELAYED_RELEASE_TABLET | Freq: Two times a day (BID) | ORAL | Status: DC
  Administered 2019-06-27 – 2019-06-28 (×3): 40 mg via ORAL
  Filled 2019-06-26 (×3): qty 1

## 2019-06-26 MED ORDER — URSODIOL 300 MG PO CAPS
600.0000 mg | ORAL_CAPSULE | Freq: Three times a day (TID) | ORAL | Status: DC
  Administered 2019-06-27 – 2019-06-28 (×5): 600 mg via ORAL
  Filled 2019-06-26 (×7): qty 2

## 2019-06-26 MED ORDER — ACETAMINOPHEN 325 MG PO TABS
650.0000 mg | ORAL_TABLET | Freq: Four times a day (QID) | ORAL | Status: DC | PRN
  Administered 2019-06-26 – 2019-06-28 (×2): 650 mg via ORAL
  Filled 2019-06-26 (×2): qty 2

## 2019-06-26 MED ORDER — ENOXAPARIN SODIUM 40 MG/0.4ML ~~LOC~~ SOLN: 40.0000 mg | Freq: Every day | SUBCUTANEOUS | Status: DC

## 2019-06-26 MED ORDER — METHYLPREDNISOLONE SODIUM SUCC 125 MG IJ SOLR
125.0000 mg | Freq: Once | INTRAMUSCULAR | Status: AC
  Administered 2019-06-26: 15:00:00 125 mg via INTRAVENOUS
  Filled 2019-06-26: qty 2

## 2019-06-26 MED ORDER — PREDNISONE 20 MG PO TABS
50.0000 mg | ORAL_TABLET | Freq: Every day | ORAL | Status: DC
  Administered 2019-06-27: 50 mg via ORAL
  Filled 2019-06-26: qty 2

## 2019-06-26 MED ORDER — INSULIN ASPART 100 UNIT/ML ~~LOC~~ SOLN: 0.0000 [IU] | Freq: Three times a day (TID) | SUBCUTANEOUS | Status: DC

## 2019-06-26 MED ORDER — ALBUTEROL SULFATE (2.5 MG/3ML) 0.083% IN NEBU: 2.5000 mg | INHALATION_SOLUTION | RESPIRATORY_TRACT | Status: DC | PRN

## 2019-06-26 MED ORDER — AZITHROMYCIN 250 MG PO TABS
250.0000 mg | ORAL_TABLET | Freq: Every day | ORAL | Status: DC
  Administered 2019-06-27 – 2019-06-28 (×2): 250 mg via ORAL
  Filled 2019-06-26 (×2): qty 1

## 2019-06-26 MED ORDER — POLYETHYLENE GLYCOL 3350 17 G PO PACK: 17.0000 g | PACK | Freq: Every day | ORAL | Status: DC | PRN

## 2019-06-26 MED ORDER — INSULIN ASPART 100 UNIT/ML ~~LOC~~ SOLN
0.0000 [IU] | Freq: Three times a day (TID) | SUBCUTANEOUS | Status: DC
  Administered 2019-06-26: 3 [IU] via SUBCUTANEOUS
  Administered 2019-06-27: 5 [IU] via SUBCUTANEOUS
  Administered 2019-06-27 – 2019-06-28 (×4): 2 [IU] via SUBCUTANEOUS

## 2019-06-26 MED ORDER — IPRATROPIUM-ALBUTEROL 0.5-2.5 (3) MG/3ML IN SOLN
RESPIRATORY_TRACT | Status: AC
  Administered 2019-06-26: 3 mL via RESPIRATORY_TRACT
  Filled 2019-06-26: qty 3

## 2019-06-26 MED ORDER — LEVOTHYROXINE SODIUM 25 MCG PO TABS
125.0000 ug | ORAL_TABLET | Freq: Two times a day (BID) | ORAL | Status: DC
  Administered 2019-06-27 – 2019-06-28 (×3): 125 ug via ORAL
  Filled 2019-06-26 (×3): qty 1

## 2019-06-26 NOTE — Progress Notes (Signed)
Virtual Visit via Telephone Note  I connected with Holly Mueller on 06/26/19 at 11:00 AM EST by telephone and verified that I am speaking with the correct person using two identifiers.  Location: Patient: Home  Provider: Office    I discussed the limitations, risks, security and privacy concerns of performing an evaluation and management service by telephone and the availability of in person appointments. I also discussed with the patient that there may be a patient responsible charge related to this service. The patient expressed understanding and agreed to proceed.   History of Present Illness: 35 female never smoker followed for ILD and Oxygen dependent respiratory failure  Medical history significant for cirrhosis and varices   Today's televisit is for acute office visit for 1 week of increased dyspnea , dry cough and increased oxygen demands.  Patient complains over the last few days is been getting progressively worse, she is had associated low-grade fever and general malaise. Patient says she is unable to sleep in the bed she is having to use 3 pillows to prop herself up.  Minimal activity she gets very short of breath.  Oxygen levels have been dropping down into the low 80s off of oxygen.  She says that she is requiring more oxygen but still remains very short of breath.  During televisit patient is audibly short of breath and has to have frequent rest breaks during conversation to catch her breath.  She denies any loss of taste or smell, chest pain, orthopnea, increased leg swelling or known sick contacts. She remains on Symbicort and Spiriva.   Observations/Objective: Positive respiratory distress during phone conversation with frequent breaks in conversation to catch breath.   Assessment and Plan: Acute respiratory distress questionable etiology-patient has restrictive lung disease with interstitial lung disease-unclear if this is an exacerbation of her ILD versus possible  viral infection as she has a fever and increased cough. She has increased oxygen demands. She will need further evaluation at higher level care. Commend that she go to the emergency room for further evaluation as she is high risk for decompensation  Plan  Please go to the emergency room for evaluation. Continue on current regimen. Pending evaluation will need follow-up in the office in 1 to 2 weeks.  Follow Up Instructions: Follow-up in 1 to 2 weeks and as needed  Please contact office for sooner follow up if symptoms do not improve or worsen or seek emergency care     I discussed the assessment and treatment plan with the patient. The patient was provided an opportunity to ask questions and all were answered. The patient agreed with the plan and demonstrated an understanding of the instructions.   The patient was advised to call back or seek an in-person evaluation if the symptoms worsen or if the condition fails to improve as anticipated.  I provided 22  minutes of non-face-to-face time during this encounter.   Rexene Edison, NP

## 2019-06-26 NOTE — ED Notes (Signed)
Lab called this RN for critical WBC of 1.1

## 2019-06-26 NOTE — Patient Instructions (Addendum)
These go to the emergency room for further evaluation as you are breathing appears to be unstable today.   Continue on Symbicort 2 puffs twice daily Continue on Spiriva daily Continue on oxygen 2 L-Goal is for Oxygen levels >90%  Follow with Dr. Chase Caller in 2 weeks and as needed Please contact office for sooner follow up if symptoms do not improve or worsen or seek emergency care

## 2019-06-26 NOTE — ED Notes (Signed)
Lab reported critical WBC of 1.0

## 2019-06-26 NOTE — ED Triage Notes (Signed)
Pt endorses SOB for 1 week where it wakes her up out of her sleep. States she had a fever last night and felt like her heart was racing. Pt on home O2 2L. Pt coughing in triage but states it is a chronic cough.

## 2019-06-26 NOTE — ED Provider Notes (Signed)
[facility] EMERGENCY DEPARTMENT Provider Note   CSN: [id number] Arrival date & time: 06/26/19  1240     History Chief Complaint  Patient presents with   Shortness of Breath    Holly Mueller is a 54 y.o. female.  The history is provided by the patient. No language interpreter was used.  Shortness of Breath Severity:  Moderate Onset quality:  Gradual Duration:  1 week Timing:  Constant Progression:  Worsening Chronicity:  New Context: URI   Relieved by:  Nothing Worsened by:  Nothing Ineffective treatments:  Oxygen and inhaler Associated symptoms: cough and fever   Risk factors: no prolonged immobilization   Pt reports she has felt like she has had a cold for the past week.  Pt reports she is more short of breath.  Pt reports she can not walk from bed to kitchen which she could a week ago.      Past Medical History:  Diagnosis Date   Acute respiratory failure with hypoxia ([place]) 09/22/2018   Anxiety    "anxiety attacks- sometimes"   Arthritis    bursitis left hip flares-not an issue   Chronic kidney disease    Community acquired pneumonia 11/28/2018   Diabetes mellitus without complication ([place])    Type II   Dysrhythmia    Edentulous    10-19-13 at present   Epilepsy ([place]) in 1972   No seizures since 1972. Previously treated with phenobarbital.    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 ([place])    History of blood transfusion    gi bleed   History of kidney stones    passed   Hypothyroidism 2008   Interstitial lung disease ([place])    Leukopenia    Lower esophageal ring 08/18/2013   Pneumonia    Primary biliary cholangitis ([place]) 10/24/2018   Seasonal allergies 2003    SEIZURES, HX OF 12/12/2009   Annotation: last seizure in early 1970s Qualifier: Diagnosis of  By: Carlena Sax  MD, Colletta [place]     Shortness of breath    walking   Wears glasses     Patient Active Problem List   Diagnosis Date Noted   It band syndrome, left 06/16/2019   Rectal bleeding 06/16/2019   On supplemental oxygen therapy    Nodule of right lung    Respiratory distress    Community acquired pneumonia of left lower lobe of lung 05/22/2019   Petechiae 05/01/2019   Lower extremity edema 05/01/2019   Irritant contact dermatitis due to detergent 03/31/2019   Melena 03/31/2019   Financial difficulties 03/31/2019   Skin rash 03/17/2019   Intertrigo 02/11/2019   Dysuria 02/11/2019   Chronic respiratory failure with hypoxia ([place]) 01/11/2019   Inadequate housing 12/05/2018   Primary biliary cholangitis ([place]) 10/24/2018   Gastric varices with bleeding    Anemia 09/22/2018   Pancytopenia ([place]) 08/28/2018   Does not have health insurance 04/04/2018   ILD (interstitial lung disease) ([place]) 03/23/2018   Type 2 diabetes mellitus without complication, without long-term current use of insulin (HCC)    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 ([place]) 11/17/2016   GERD (gastroesophageal reflux disease) 10/23/2016   Chronic cough 12/09/2015   Precordial chest  pain 08/03/2015   DOE (dyspnea on exertion) 08/17/2013   Ganglion cyst of wrist 08/24/2012   Right hip pain 08/23/2012   Complex care coordination 02/14/2012   Obesity 07/08/2010   Shortness of breath 12/12/2009   Depression 07/26/2008   Hypothyroidism 10/12/2006   Restrictive lung disease 07/30/2006    Past Surgical History:  Procedure Laterality Date   ABDOMINAL HYSTERECTOMY     " partial "   BALLOON DILATION N/A 10/24/2013   Procedure: BALLOON DILATION;  Surgeon: Gatha Mayer, MD;  [facility]: [facility];  Service: Endoscopy;  Laterality: N/A;   BIOPSY  09/26/2018   Procedure:  BIOPSY;  Surgeon: Thornton Park, MD;  Location: [place];  Service: Gastroenterology;;   BIOPSY  01/09/2019   Procedure: BIOPSY;  Surgeon: Irving Copas., MD;  Location: [place];  Service: Gastroenterology;;   CESAREAN SECTION     x2   DENTAL SURGERY     multiple extractions 3'15   DILATION AND CURETTAGE OF UTERUS     ESOPHAGOGASTRODUODENOSCOPY N/A 10/24/2013   Procedure: ESOPHAGOGASTRODUODENOSCOPY (EGD);  Surgeon: Gatha Mayer, MD;  Location: [facility];  Service: Endoscopy;  Laterality: N/A;   ESOPHAGOGASTRODUODENOSCOPY N/A 01/09/2019   Procedure: ESOPHAGOGASTRODUODENOSCOPY (EGD);  Surgeon: Irving Copas., MD;  Location: [place];  Service: Gastroenterology;  Laterality: N/A;   ESOPHAGOGASTRODUODENOSCOPY (EGD) WITH PROPOFOL N/A 09/26/2018   Procedure: ESOPHAGOGASTRODUODENOSCOPY (EGD) WITH PROPOFOL;  Surgeon: Thornton Park, MD;  Location: [place];  Service: Gastroenterology;  Laterality: N/A;   ESOPHAGOGASTRODUODENOSCOPY (EGD) WITH PROPOFOL N/A 04/26/2019   Procedure: ESOPHAGOGASTRODUODENOSCOPY (EGD) WITH PROPOFOL;  Surgeon: Rush Landmark Telford Nab., MD;  Location: [place];  Service: Gastroenterology;  Laterality: N/A;   HEMOSTASIS CLIP PLACEMENT  04/26/2019   Procedure: HEMOSTASIS CLIP PLACEMENT;  Surgeon: Irving Copas., MD;  Location: [place];  Service: Gastroenterology;;   IR PARACENTESIS  09/23/2018   LIVER BIOPSY  06/30/2012   Procedure: LIVER BIOPSY;  Surgeon: Shann Medal, MD;  Location: [facility];  Service: General;;   [place]  04/26/2019   Procedure: Clide Deutscher;  Surgeon: Mansouraty, Telford Nab., MD;  Location: [place];  Service: Gastroenterology;;  cyanoacrylate   SHOULDER ARTHROSCOPY Right 1443   UMBILICAL HERNIA REPAIR N/A 06/30/2012   Procedure: remove umbilicus;  Surgeon: Shann Medal, MD;  [facility];  Service: General;  Laterality: N/A;   UPPER ESOPHAGEAL  ENDOSCOPIC ULTRASOUND (EUS) N/A 01/09/2019   Procedure: UPPER ESOPHAGEAL ENDOSCOPIC ULTRASOUND (EUS);  Surgeon: Irving Copas., MD;  Location: [place];  Service: Gastroenterology;  Laterality: N/A;   VENTRAL HERNIA REPAIR N/A 06/30/2012   Procedure: LAPAROSCOPIC VENTRAL HERNIA;  Surgeon: Shann Medal, MD;  [facility];  Service: General;  Laterality: N/A;  With Mesh   VIDEO BRONCHOSCOPY Bilateral 07/20/2018   Procedure: VIDEO BRONCHOSCOPY WITHOUT FLUORO;  Surgeon: Brand Males, MD;  [facility];  Service: Cardiopulmonary;  Laterality: Bilateral;   WISDOM TOOTH EXTRACTION       OB History   No obstetric history on file.     Family History  Problem Relation Age of Onset   Hypertension Mother    Diabetes Mother    Cirrhosis Mother        ? medications   Lung cancer Father    Stroke Father    Diabetes Sister    Hypertension Sister    Colon cancer Neg Hx    Esophageal cancer Neg Hx    Rectal cancer Neg Hx  Social History   Tobacco Use   Smoking status: Never Smoker   Smokeless tobacco: Never Used   Tobacco comment: 04/25/2019- "I smoke part of a cigarette and put it out,I did not like it."  Substance Use Topics   Alcohol use: Not Currently   Drug use: No    Home Medications Prior to Admission medications   Medication Sig Start Date End Date Taking? Authorizing Provider  Acetaminophen (APAP) 325 MG tablet Take 2 tablets (650 mg total) by mouth every 6 (six) hours as needed. 05/27/19   Bonnita Hollow, MD  albuterol (PROAIR HFA) 108 (90 Base) MCG/ACT inhaler INHALE 2 PUFFS INTO THE LUNGS EVERY 6 HOURS AS NEEDED FOR SHORTNESS OF BREATH Patient taking differently: Inhale 2 puffs into the lungs every 4 (four) hours as needed for wheezing or shortness of breath.  04/25/18   Rory Percy, DO  albuterol (PROVENTIL) (2.5 MG/3ML) 0.083% nebulizer solution Take 3 mLs (2.5 mg total) by nebulization every 6 (six) hours as needed  for wheezing or shortness of breath. 04/10/19   Brand Males, MD  blood glucose meter kit and supplies Dispense based on patient and insurance preference. Use up to four times daily as directed. (FOR ICD-10 E10.9, E11.9). 09/02/18   Dessa Phi, DO  budesonide-formoterol (SYMBICORT) 80-4.5 MCG/ACT inhaler Inhale 2 puffs into the lungs 2 (two) times daily for 1 day. 01/11/19 03/08/20  Lauraine Rinne, NP  cetirizine (ZYRTEC) 10 MG tablet Take 1 tablet (10 mg total) by mouth daily. 01/27/17   Glenis Smoker, MD  diclofenac Sodium (VOLTAREN) 1 % GEL Apply 4 g topically 4 (four) times daily as needed (pain). 05/27/19   Lyndee Hensen, MD  fluticasone (FLONASE) 50 MCG/ACT nasal spray Place 2 sprays into both nostrils daily.    [provider]  glucose blood (TRUE METRIX BLOOD GLUCOSE TEST) test strip Use as instructed 09/16/18   Glenis Smoker, MD  levothyroxine (SYNTHROID) 125 MCG tablet TAKE 2 TABLETS (250 MCG TOTAL) BY MOUTH DAILY. Patient taking differently: Take 125 mcg by mouth 2 (two) times daily.  04/12/19   Meccariello, Bernita Raisin, DO  metFORMIN (GLUCOPHAGE) 500 MG tablet Take 1 tablet (500 mg total) by mouth 2 (two) times daily with a meal. 12/06/18   Meccariello, Bernita Raisin, DO  OXYGEN Inhale 2 L into the lungs continuous.     [provider]  pantoprazole (PROTONIX) 40 MG tablet Take 1 tablet (40 mg total) by mouth 2 (two) times daily before a meal. 10/17/18   Glenis Smoker, MD  SPIRIVA HANDIHALER 18 MCG inhalation capsule INHALE 1 CAPSULE VIA HANDIHALER ONCE DAILY AT [place] Patient taking differently: Place 18 mcg into inhaler and inhale daily.  01/09/19   Meccariello, Bernita Raisin, DO  TRUEplus Lancets 28G MISC Use to check blood sugar as directed. 09/16/18   Glenis Smoker, MD  ursodiol (ACTIGALL) 300 MG capsule Take 2 capsules (600 mg total) by mouth 3 (three) times daily. 05/27/19 06/26/19  Lyndee Hensen, MD    Allergies     Aspirin  Review of Systems   Review of Systems  Constitutional: Positive for fever.  Respiratory: Positive for cough and shortness of breath.   All other systems reviewed and are negative.   Physical Exam Updated Vital Signs BP 107/72 (BP Location: Left Arm)    Pulse 86    Temp 98.1 F (36.7 C) (Oral)    Resp (!) 22    Ht _0  (1.676  m)    Wt 111.1 kg    LMP  (LMP Unknown)    SpO2 97%    BMI 39.54 kg/m   Physical Exam Vitals and nursing note reviewed.  Constitutional:      Appearance: She is well-developed.  HENT:     Head: Normocephalic.  Eyes:     Pupils: Pupils are equal, round, and reactive to light.  Cardiovascular:     Rate and Rhythm: Normal rate and regular rhythm.  Pulmonary:     Effort: Pulmonary effort is normal.  Chest:     Chest wall: No mass.  Abdominal:     General: There is no distension.     Palpations: Abdomen is soft.  Musculoskeletal:        General: Normal range of motion.     Cervical back: Normal range of motion.  Skin:    General: Skin is warm.  Neurological:     Mental Status: She is alert and oriented to person, place, and time.  Psychiatric:        Mood and Affect: Mood normal.     ED Results / Procedures / Treatments   Labs (all labs ordered are listed, but only abnormal results are displayed) Labs Reviewed  BASIC METABOLIC PANEL - Abnormal; Notable for the following components:      Result Value   CO2 19 (*)    Glucose, Bld 133 (*)    Calcium 8.7 (*)    All other components within normal limits  CBC - Abnormal; Notable for the following components:   WBC 1.1 (*)    RBC 3.39 (*)    Hemoglobin 8.8 (*)    HCT 29.2 (*)    Platelets 62 (*)    All other components within normal limits  I-STAT BETA HCG BLOOD, ED ([facility], [facility], [facility] ONLY)  POC SARS CORONAVIRUS 2 AG -  ED  TROPONIN I (HIGH SENSITIVITY)  TROPONIN I (HIGH SENSITIVITY)    EKG EKG Interpretation  Date/Time:  Monday June 26 2019 12:42:35 EST Ventricular Rate:   91 PR Interval:  150 QRS Duration: 84 QT Interval:  366 QTC Calculation: 450 R Axis:   17 Text Interpretation: Normal sinus rhythm Possible Inferior infarct , age undetermined Cannot rule out Anterior infarct , age undetermined Abnormal ECG Confirmed by Quintella Reichert [phone]) on 06/26/2019 1:31:12 PM   Radiology DG Chest 2 View  Result Date: 06/26/2019 CLINICAL DATA:  Chest pain and shortness of breath EXAM: CHEST - 2 VIEW COMPARISON:  May 23 2019 FINDINGS: There is atelectasis in the bases. There is mild generalized interstitial thickening. The lungs elsewhere are clear. Heart size and pulmonary vascularity are normal. No adenopathy. No bone lesions. No pneumothorax. IMPRESSION: Bibasilar atelectasis. Interstitial thickening may reflect chronic inflammatory type change. No edema or airspace opacity. Cardiac silhouette within normal limits. No adenopathy. Electronically Signed   By: Lowella Grip III M.D.   On: 06/26/2019 13:15    Procedures Procedures (including critical care time)  Medications Ordered in ED Medications  methylPREDNISolone sodium succinate (SOLU-MEDROL) 125 mg/2 mL injection 125 mg (has no administration in time range)    ED Course  I have reviewed the triage vital signs and the nursing notes.  Pertinent labs & imaging results that were available during my care of the patient were reviewed by me and considered in my medical decision making (see chart for details).    MDM Rules/Calculators/A&P  MDM:  Pt increased from 2 liters to 3 liters.  Pt breathing 30 times a minute.  Chest xray shows atelectasis.  Pt given solumedrol IV.  I spoke to the family practice service who will see for admission Final Clinical Impression(s) / ED Diagnoses Final diagnoses:  COPD exacerbation [facility])    Rx / DC Orders ED Discharge Orders    None       Sidney Ace 06/26/19 1543    Quintella Reichert, MD 06/27/19 1801

## 2019-06-26 NOTE — ED Notes (Signed)
RN attempted report

## 2019-06-26 NOTE — H&P (Addendum)
[facility] Admission History and Physical Service Pager: [phone]  Patient name: Holly Mueller Medical record number: [id number] Date of birth: 1966/02/14 Age: 54 y.o. Gender: female  Primary Care Provider: Cleophas Dunker, DO Consultants: None Code Status: Full Code Preferred Emergency Contact: [name] [phone]  Chief Complaint: Shortness of breath  Assessment and Plan: Holly Mueller is a 54 y.o. female presenting with dyspnea on exertion. PMH is significant for COPD/ILD, Hypothyroidism, pancytopenia,Primary Biliary Cholangitis/Cirrhosis with esophageal varices, Diet controlled T2DM, Depression.   Acute Hypoxic Respiratory Failure Acute on Chronic, worsening. Presented to the ED this afternoon after an office visit virtually at her pulmonologist and had a desaturation to the 80s at home. Has been experiencing worsening dyspnea for at least 1 week with decreased ability to walk to her kitchen without becoming short of breath. She has also required 4 pillows to sleep at night but has not had to increase her home oxygen of 2L. She reports compliance with respiratory medications. Has been febrile, Tmax 100.1 at home and had productive cough of yellow mucus that has since become more dry. Does have discomfort from coughing. Also with lower extremity pitting edema up to the patellar region.  In the ED she was requiring 3L [place] with an O2 saturation of 99% with increased respirations. Her weight is up almost 2kg since last admission. WBC 1.1, RBC 3.39, Plt 62. Glu 133. Cr 0.60. Rapid COVID negative. 6-24hr COVID pending. ABG: pH 7.4, pCO2 31.5, pO2 95, bicarb 19.6, acid-base deficit 4. BNP pending. CXR w/ bibasilar atelectasis. Interstitial thickening may reflect chronic inflammatory type change. Received albuterol 8 puffs x1 and solu-medrol x1.  Etiology of respiratory state could be multifactorial: chronic interstitial lung disease v. COPD  exacerbation v. New-onset heart failure. Chronic ILD/COPD exacerbation is a possibility with increasing oxygen requirement although mostly for comfort as she has not had any desaturations since admission and not likely if continued compliance with medications. Consider retrieving repeat PFTs. Possibility of new onset heart failure due to new symptoms suggestive of PND and orthopnea. She also has LE edema and bibasilar crackles consistent with imaging that suggests a fluid overloaded state. Of note, patient has a known heart murmur most appreciable at the left sternal border. Will obtain BNP and potentially an echo.  -Admit to FPTS, Attending Dr. Darcel Smalling -AM CBC, BMP -Tylenol 650 mg q6h PRN -Albuterol neb q2h PRN, Duoneb q4h -Azithromycin x5 days -Prednisone 53m  -cardiac monitring, continuous pulse ox -PT/OT eval and treat -vitals per protocol -strict Is & Os -Consider echo -f/u BNP - Repeat EKG in the am; watch for QTc prolongation  COPD/ILD Last PFT 05/24/2019 which indicated air trapping and loss of functional alveolar capillary surface and the diffusing capacity not corrected for patients hemoglobin. O2 sats on 3 L [place] 99%. Has dyspnea on exertion and with conversation. Physical exam with barrel chest and audible crackles at lung bases bilaterally. CXR w/ bibasilar atelectasis. Interstitial thickening may reflect chronic inflammatory type change. ABG: pH 7.4, pCO2 31.5, pO2 95, bicarb 19.6, acid-base deficit 4. Home medications include albuterol inhaler/nebulizer, symbicort, spiriva. Endorses compliance with all medications. -Albuterol neb q2h PRN  -Scheduled duoneb q4h -Azithromycin x 5 days -Prednisone 561mqd -PT/OT eval and treat -Maintain O2 saturation of 88-92% -F/u ABG  Diet controlled T2DM Last Hgb A1c 5.8 05/23/2019. Home medication is Metformin 50080mID. Glu on BMP is 133. -Continue home metformin  -sSSI -CBGs TID ac & hs   Hypothyroidism TSH 11/21/2018  was 0.031. Does  endorse fatigue and could be multifactorial due to respiratory status and low TSH. Has intermittent constipation and has bowel regimen. She is not bradycardic HR range on admission is 84-99. Home medication is levothyroxine 19mg BID. -Continue home medications  Pancytopenia On admission WBC 1.1, RBC 3.39, plt 62. These labs are consistent with her baseline. Does report fever on admission not likely to be reflexed with normal response of cell lines. She is followed by [facility], Dr. DLorenso Courier Last office visit 06/01/2019 etiology was suggested to be likely worsening cirrhosis. A bone marrow biopsy was performed on 12/02/2016 which showed slightly hypercellular trilineage monoparesis with no significant increase in blast cells or dyspoiesis noted. As such at that time it was thought that her hematological findings were secondary to her cirrhosis. -Monitor fever curve  -AM CBC -Hepatic panel in the am     Primary Biliary Cholangitis/Cirrhosis with esophageal varices No GI issues at time of admission other than hemorrhoids that she was treating with preparation H. Hgb on admission was 8.8. Baseline is 8.8-10.3. She is followed by [facility] w/ Dr. CSilvano Rusk Last office visit 03/17/2019 and was told to continue home medication ursodiol and a cyanoacrylate injection and ablation was completed in 04/26/2019. -Continue ursodiol  6069mTID -Monitor hemoglobin on AM CBC -f/u LFTs  Depression Not on home medications. [facility] MeSandi Carnelinic in November with a PHQ-9 score of 11 was given therapist list, did not have SI, and did not want medication treatment at that time.  -Continue to monitor mood   GERD -Continue protonix 4048mID   FEN/GI: Modified-carb diet, Protonix 40 BID  Prophylaxis: Lovenox  Disposition: Med-Surg, FPTS  History of Present Illness:  Holly Mueller a 54 9o. female presenting with feelings of SOB during exertion for about a week with progessively  worsening ability to walk to the kitchen. Last night got to feeling "really bad". Had temperature of 100.1 at home. Took two tylenol fever symptoms went away. Spoke to a NP at her [facility] today and she recommended coming to the ED top be evaluated. She also has had a cough. Slight;y productive with yellow mucus. Actively short of breath while at rest and talking to us [place] exam. She is on 2L of home O2 regularly. She is on 3L now. Sats remain above 92%. She uses breathing treatments at home but states it "seems like I couldn't breathe the solution in". Was not able to finish her breathing treatments. Using pillows at night to sleep, 3 normally sleeps with two. No chest pain, reports a little "pinch" when coughing. She says she has "always had that". She states she has been compliant with taking her medications every day as her sister has "stayed on my case to make sure I take all my medicine". Lives with her sister and her brother. Unaware of any sick contacts and no one else in the home sick.  Review Of Systems: Per HPI with the following additions:   Review of Systems  Constitutional: Positive for chills and fever. Negative for diaphoresis and malaise/fatigue.  HENT: Negative for congestion.   Respiratory: Positive for cough, sputum production and shortness of breath. Negative for wheezing.   Cardiovascular: Positive for orthopnea and leg swelling. Negative for chest pain and palpitations.  Gastrointestinal: Negative for abdominal pain, constipation, diarrhea, nausea and vomiting.  Genitourinary: Negative for dysuria, frequency and urgency.  Musculoskeletal: Negative for falls.  Neurological: Positive for headaches. Negative for  dizziness, focal weakness, loss of consciousness and weakness.    Patient Active Problem List   Diagnosis Date Noted  . COPD exacerbation ([place]) 06/26/2019  . It band syndrome, left 06/16/2019  . Rectal bleeding 06/16/2019  . On supplemental oxygen therapy    . Nodule of right lung   . Respiratory distress   . Community acquired pneumonia of left lower lobe of lung 05/22/2019  . Petechiae 05/01/2019  . Lower extremity edema 05/01/2019  . Irritant contact dermatitis due to detergent 03/31/2019  . Melena 03/31/2019  . Financial difficulties 03/31/2019  . Skin rash 03/17/2019  . Intertrigo 02/11/2019  . Dysuria 02/11/2019  . Chronic respiratory failure with hypoxia ([place]) 01/11/2019  . Inadequate housing 12/05/2018  . Primary biliary cholangitis ([place]) 10/24/2018  . Gastric varices with bleeding   . Anemia 09/22/2018  . Pancytopenia ([place]) 08/28/2018  . Does not have health insurance 04/04/2018  . ILD (interstitial lung disease) ([place]) 03/23/2018  . Type 2 diabetes mellitus without complication, without long-term current use of insulin ([place])   . 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 GERD (gastroesophageal reflux disease) 10/23/2016  . Chronic cough 12/09/2015  . Precordial chest pain 08/03/2015  . DOE (dyspnea on exertion) 08/17/2013  . Ganglion cyst of wrist 08/24/2012  . Right hip pain 08/23/2012  . Complex care coordination 02/14/2012  . Obesity 07/08/2010  . Shortness of breath 12/12/2009  . Depression 07/26/2008  . Hypothyroidism 10/12/2006  . Restrictive lung disease 07/30/2006    Past Medical History: Past Medical History:  Diagnosis Date  . Acute respiratory failure with hypoxia ([place]) 09/22/2018  . Anxiety    "anxiety attacks- sometimes"  . Arthritis    bursitis left hip flares-not an issue  . Chronic kidney disease   . Community acquired pneumonia 11/28/2018  . Diabetes mellitus without complication ([place])    Type II  . Dysrhythmia   . Edentulous    10-19-13 at present  . Epilepsy ([place]) in 1972   No seizures since 1972. Previously treated with phenobarbital.   . Gastric varices with bleeding   . GERD (gastroesophageal reflux disease) 2008  . Head injury    fell hit head in  cafeteria- co- workers said she briefly lost consciousnesss  . Headache(784.0)    hx of migraines   . Heart murmur   . 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 History of blood transfusion    gi bleed  . History of kidney stones    passed  . Hypothyroidism 2008  . Interstitial lung disease ([place])   . Leukopenia   . Lower esophageal ring 08/18/2013  . Pneumonia   . Primary biliary cholangitis ([place]) 10/24/2018  . Seasonal allergies 2003  . SEIZURES, HX OF 12/12/2009   Annotation: last seizure in early 1970s Qualifier: Diagnosis of  By: Carlena Sax  MD, Colletta [place]    . Shortness of breath    walking  . Wears glasses     Past Surgical History: Past Surgical History:  Procedure Laterality Date  . ABDOMINAL HYSTERECTOMY     " partial "  . BALLOON DILATION N/A 10/24/2013   Procedure: BALLOON DILATION;  Surgeon: Gatha Mayer, MD;  Location: [facility];  Service: Endoscopy;  Laterality: N/A;  . BIOPSY  09/26/2018   Procedure: BIOPSY;  Surgeon: Thornton Park, MD;  Location: [facility];  Service: Gastroenterology;;  . BIOPSY  01/09/2019   Procedure: BIOPSY;  Surgeon: Irving Copas., MD;  [facility];  Service: Gastroenterology;;  . CESAREAN SECTION     x2  . DENTAL SURGERY     multiple extractions 3'15  . DILATION AND CURETTAGE OF UTERUS    . ESOPHAGOGASTRODUODENOSCOPY N/A 10/24/2013   Procedure: ESOPHAGOGASTRODUODENOSCOPY (EGD);  Surgeon: Gatha Mayer, MD;  [facility];  Service: Endoscopy;  Laterality: N/A;  . ESOPHAGOGASTRODUODENOSCOPY N/A 01/09/2019   Procedure: ESOPHAGOGASTRODUODENOSCOPY (EGD);  Surgeon: Irving Copas., MD;  Location: [facility];  Service: Gastroenterology;  Laterality: N/A;  . ESOPHAGOGASTRODUODENOSCOPY (EGD) WITH PROPOFOL N/A 09/26/2018   Procedure: ESOPHAGOGASTRODUODENOSCOPY (EGD) WITH PROPOFOL;  Surgeon: Thornton Park, MD;  Location: [facility];  Service: Gastroenterology;  Laterality: N/A;  .  ESOPHAGOGASTRODUODENOSCOPY (EGD) WITH PROPOFOL N/A 04/26/2019   Procedure: ESOPHAGOGASTRODUODENOSCOPY (EGD) WITH PROPOFOL;  Surgeon: Rush Landmark Telford Nab., MD;  Location: [facility];  Service: Gastroenterology;  Laterality: N/A;  . HEMOSTASIS CLIP PLACEMENT  04/26/2019   Procedure: HEMOSTASIS CLIP PLACEMENT;  Surgeon: Irving Copas., MD;  Location: [facility];  Service: Gastroenterology;;  . IR PARACENTESIS  09/23/2018  . LIVER BIOPSY  06/30/2012   Procedure: LIVER BIOPSY;  Surgeon: Shann Medal, MD;  [facility];  Service: General;;  . NOVASURE ABLATION    . SCLEROTHERAPY  04/26/2019   Procedure: Clide Deutscher;  Surgeon: Mansouraty, Telford Nab., MD;  Location: [facility];  Service: Gastroenterology;;  cyanoacrylate  . SHOULDER ARTHROSCOPY Right 2011  . UMBILICAL HERNIA REPAIR N/A 06/30/2012   Procedure: remove umbilicus;  Surgeon: Shann Medal, MD;  [facility];  Service: General;  Laterality: N/A;  . UPPER ESOPHAGEAL ENDOSCOPIC ULTRASOUND (EUS) N/A 01/09/2019   Procedure: UPPER ESOPHAGEAL ENDOSCOPIC ULTRASOUND (EUS);  Surgeon: Irving Copas., MD;  Location: [facility];  Service: Gastroenterology;  Laterality: N/A;  . VENTRAL HERNIA REPAIR N/A 06/30/2012   Procedure: LAPAROSCOPIC VENTRAL HERNIA;  Surgeon: Shann Medal, MD;  [facility];  Service: General;  Laterality: N/A;  With Mesh  . VIDEO BRONCHOSCOPY Bilateral 07/20/2018   Procedure: VIDEO BRONCHOSCOPY WITHOUT FLUORO;  Surgeon: Brand Males, MD;  [facility];  Service: Cardiopulmonary;  Laterality: Bilateral;  . WISDOM TOOTH EXTRACTION      Social History: Social History   Tobacco Use  . Smoking status: Never Smoker  . Smokeless tobacco: Never Used  . Tobacco comment: 04/25/2019- "I smoke part of a cigarette and put it out,I did not like it."  Substance Use Topics  . Alcohol use: Not Currently  . Drug use: No   Additional social history: None Please also refer to  relevant sections of EMR.  Family History: Family History  Problem Relation Age of Onset  . Hypertension Mother   . Diabetes Mother   . Cirrhosis Mother        ? medications  . Lung cancer Father   . Stroke Father   . Diabetes Sister   . Hypertension Sister   . Colon cancer Neg Hx   . Esophageal cancer Neg Hx   . Rectal cancer Neg Hx     Allergies and Medications: Allergies  Allergen Reactions  . Aspirin Nausea Only and Other (See Comments)    Upset stomach   No current facility-administered medications on file prior to encounter.   Current Outpatient Medications on File Prior to Encounter  Medication Sig Dispense Refill  . Acetaminophen (APAP) 325 MG tablet Take 2 tablets (650 mg total) by mouth every 6 (six) hours as needed. 30 tablet   . albuterol (PROAIR HFA) 108 (90 Base) MCG/ACT  inhaler INHALE 2 PUFFS INTO THE LUNGS EVERY 6 HOURS AS NEEDED FOR SHORTNESS OF BREATH (Patient taking differently: Inhale 2 puffs into the lungs every 4 (four) hours as needed for wheezing or shortness of breath. ) 8.5 g 5  . albuterol (PROVENTIL) (2.5 MG/3ML) 0.083% nebulizer solution Take 3 mLs (2.5 mg total) by nebulization every 6 (six) hours as needed for wheezing or shortness of breath. 75 mL 2  . blood glucose meter kit and supplies Dispense based on patient and insurance preference. Use up to four times daily as directed. (FOR ICD-10 E10.9, E11.9). 1 each 0  . budesonide-formoterol (SYMBICORT) 80-4.5 MCG/ACT inhaler Inhale 2 puffs into the lungs 2 (two) times daily for 1 day. 1 Inhaler 0  . cetirizine (ZYRTEC) 10 MG tablet Take 1 tablet (10 mg total) by mouth daily. 30 tablet 11  . diclofenac Sodium (VOLTAREN) 1 % GEL Apply 4 g topically 4 (four) times daily as needed (pain). 50 g 0  . fluticasone (FLONASE) 50 MCG/ACT nasal spray Place 2 sprays into both nostrils daily.    Marland Kitchen glucose blood (TRUE METRIX BLOOD GLUCOSE TEST) test strip Use as instructed 100 each 12  . levothyroxine (SYNTHROID)  125 MCG tablet TAKE 2 TABLETS (250 MCG TOTAL) BY MOUTH DAILY. (Patient taking differently: Take 125 mcg by mouth 2 (two) times daily. ) 60 tablet 3  . metFORMIN (GLUCOPHAGE) 500 MG tablet Take 1 tablet (500 mg total) by mouth 2 (two) times daily with a meal. 180 tablet 2  . OXYGEN Inhale 2 L into the lungs continuous.     . pantoprazole (PROTONIX) 40 MG tablet Take 1 tablet (40 mg total) by mouth 2 (two) times daily before a meal. 60 tablet 3  . SPIRIVA HANDIHALER 18 MCG inhalation capsule INHALE 1 CAPSULE VIA HANDIHALER ONCE DAILY AT THE SAME TIME EVERY DAY (Patient taking differently: Place 18 mcg into inhaler and inhale daily. ) 30 capsule 0  . TRUEplus Lancets 28G MISC Use to check blood sugar as directed. 100 each 1  . ursodiol (ACTIGALL) 300 MG capsule Take 2 capsules (600 mg total) by mouth 3 (three) times daily. 180 capsule 0    Objective: BP 110/61   Pulse 86   Temp 98.1 F (36.7 C) (Oral)   Resp (!) 23   Ht _0  (1.676 m)   Wt 111.1 kg   LMP  (LMP Unknown)   SpO2 99%   BMI 39.54 kg/m    Exam:  General: Appears fatigued, no acute distress. Age appropriate. Sitting up in bed with HOB elevated to 60 degrees.  Cardiac: RRR, murmur appreciable at LSB 3/6.  Respiratory: CTAB upper lung fields, lower lung fields with crackles, increased effort. No wheezing. Dyspneic with conversation. Abdomen: soft, nontender, distended d/t body habitus Extremities: LE pitting 2+ edema. [place] pedal pulses present.  Skin: Warm and dry, multiple lesions on arms consistent with cat scartches. Neuro: alert and oriented, no focal deficits Psych: normal affect  Labs and Imaging: CBC BMET  Recent Labs  Lab 06/26/19 1253  WBC 1.1*  HGB 8.8*  HCT 29.2*  PLT 62*   Recent Labs  Lab 06/26/19 1253  NA 136  K 3.9  CL 109  CO2 19*  BUN 8  CREATININE 0.64  GLUCOSE 133*  CALCIUM 8.7*     EKG: NSR  CHEST - 2 VIEW COMPARISON:  May 23 2019 IMPRESSION: Bibasilar atelectasis.  Interstitial thickening may reflect chronic inflammatory type change. No edema or airspace opacity. Cardiac  silhouette within normal limits. No adenopathy  Autry-LottNaaman Plummer, DO 06/26/2019, 5:10 PM PGY-1, [place] Intern pager: [phone], text pages welcome  Resident Attestation   I saw and evaluated the patient, performing the key elements of the service. I personally performed or re-performed the history, physical exam, and medical decision making activities of this service and have verified that the service and findings are accurately documented in the resident's note. I developed the management plan that is described in the resident's note, and I agree with the content, with my edits above in red.   Harolyn Rutherford, DO [facility], PGY-3

## 2019-06-27 ENCOUNTER — Inpatient Hospital Stay (HOSPITAL_COMMUNITY): Admission: RE | Admit: 2019-06-27 | Payer: Medicaid Other | Source: Ambulatory Visit

## 2019-06-27 ENCOUNTER — Other Ambulatory Visit (HOSPITAL_COMMUNITY): Payer: Medicaid Other

## 2019-06-27 DIAGNOSIS — J441 Chronic obstructive pulmonary disease with (acute) exacerbation: Secondary | ICD-10-CM | POA: Diagnosis present

## 2019-06-27 DIAGNOSIS — E039 Hypothyroidism, unspecified: Secondary | ICD-10-CM | POA: Diagnosis present

## 2019-06-27 DIAGNOSIS — J9621 Acute and chronic respiratory failure with hypoxia: Secondary | ICD-10-CM | POA: Diagnosis present

## 2019-06-27 DIAGNOSIS — Z7951 Long term (current) use of inhaled steroids: Secondary | ICD-10-CM | POA: Diagnosis not present

## 2019-06-27 DIAGNOSIS — D61818 Other pancytopenia: Secondary | ICD-10-CM | POA: Diagnosis present

## 2019-06-27 DIAGNOSIS — K219 Gastro-esophageal reflux disease without esophagitis: Secondary | ICD-10-CM | POA: Diagnosis present

## 2019-06-27 DIAGNOSIS — N189 Chronic kidney disease, unspecified: Secondary | ICD-10-CM | POA: Diagnosis present

## 2019-06-27 DIAGNOSIS — J84112 Idiopathic pulmonary fibrosis: Secondary | ICD-10-CM | POA: Diagnosis present

## 2019-06-27 DIAGNOSIS — J8410 Pulmonary fibrosis, unspecified: Secondary | ICD-10-CM | POA: Diagnosis not present

## 2019-06-27 DIAGNOSIS — E669 Obesity, unspecified: Secondary | ICD-10-CM | POA: Diagnosis present

## 2019-06-27 DIAGNOSIS — J449 Chronic obstructive pulmonary disease, unspecified: Secondary | ICD-10-CM | POA: Diagnosis present

## 2019-06-27 DIAGNOSIS — K5900 Constipation, unspecified: Secondary | ICD-10-CM | POA: Diagnosis present

## 2019-06-27 DIAGNOSIS — Z20822 Contact with and (suspected) exposure to covid-19: Secondary | ICD-10-CM | POA: Diagnosis present

## 2019-06-27 DIAGNOSIS — K7460 Unspecified cirrhosis of liver: Secondary | ICD-10-CM | POA: Diagnosis present

## 2019-06-27 DIAGNOSIS — Z87442 Personal history of urinary calculi: Secondary | ICD-10-CM | POA: Diagnosis not present

## 2019-06-27 DIAGNOSIS — Z9981 Dependence on supplemental oxygen: Secondary | ICD-10-CM | POA: Diagnosis not present

## 2019-06-27 DIAGNOSIS — Z8701 Personal history of pneumonia (recurrent): Secondary | ICD-10-CM | POA: Diagnosis not present

## 2019-06-27 DIAGNOSIS — I8510 Secondary esophageal varices without bleeding: Secondary | ICD-10-CM | POA: Diagnosis present

## 2019-06-27 DIAGNOSIS — F329 Major depressive disorder, single episode, unspecified: Secondary | ICD-10-CM | POA: Diagnosis present

## 2019-06-27 DIAGNOSIS — E1122 Type 2 diabetes mellitus with diabetic chronic kidney disease: Secondary | ICD-10-CM | POA: Diagnosis present

## 2019-06-27 DIAGNOSIS — Z6839 Body mass index (BMI) 39.0-39.9, adult: Secondary | ICD-10-CM | POA: Diagnosis not present

## 2019-06-27 DIAGNOSIS — K743 Primary biliary cirrhosis: Secondary | ICD-10-CM | POA: Diagnosis present

## 2019-06-27 DIAGNOSIS — Z7984 Long term (current) use of oral hypoglycemic drugs: Secondary | ICD-10-CM | POA: Diagnosis not present

## 2019-06-27 DIAGNOSIS — Z79890 Hormone replacement therapy (postmenopausal): Secondary | ICD-10-CM | POA: Diagnosis not present

## 2019-06-27 DIAGNOSIS — Z79899 Other long term (current) drug therapy: Secondary | ICD-10-CM | POA: Diagnosis not present

## 2019-06-27 DIAGNOSIS — J984 Other disorders of lung: Secondary | ICD-10-CM | POA: Diagnosis not present

## 2019-06-27 DIAGNOSIS — K649 Unspecified hemorrhoids: Secondary | ICD-10-CM | POA: Diagnosis present

## 2019-06-27 LAB — COMPREHENSIVE METABOLIC PANEL
ALT: 28 U/L (ref 0–44)
AST: 44 U/L — ABNORMAL HIGH (ref 15–41)
Albumin: 2.3 g/dL — ABNORMAL LOW (ref 3.5–5.0)
Alkaline Phosphatase: 110 U/L (ref 38–126)
Anion gap: 8 (ref 5–15)
BUN: 14 mg/dL (ref 6–20)
CO2: 17 mmol/L — ABNORMAL LOW (ref 22–32)
Calcium: 8.6 mg/dL — ABNORMAL LOW (ref 8.9–10.3)
Chloride: 106 mmol/L (ref 98–111)
Creatinine, Ser: 0.7 mg/dL (ref 0.44–1.00)
GFR calc Af Amer: >60 mL/min (ref >60)
GFR calc non Af Amer: >60 mL/min (ref >60)
Glucose, Bld: 272 mg/dL — ABNORMAL HIGH (ref 70–99)
Potassium: 4.4 mmol/L (ref 3.5–5.1)
Sodium: 131 mmol/L — ABNORMAL LOW (ref 135–145)
Total Bilirubin: 1.7 mg/dL — ABNORMAL HIGH (ref 0.3–1.2)
Total Protein: 6.3 g/dL — ABNORMAL LOW (ref 6.5–8.1)

## 2019-06-27 LAB — CBC
HCT: 26.6 % — ABNORMAL LOW (ref 36.0–46.0)
Hemoglobin: 8.5 g/dL — ABNORMAL LOW (ref 12.0–15.0)
MCH: 26.6 pg (ref 26.0–34.0)
MCHC: 32 g/dL (ref 30.0–36.0)
MCV: 83.4 fL (ref 80.0–100.0)
Platelets: 56 10*3/uL — ABNORMAL LOW (ref 150–400)
RBC: 3.19 MIL/uL — ABNORMAL LOW (ref 3.87–5.11)
RDW: 14.8 % (ref 11.5–15.5)
WBC: 1.4 10*3/uL — CL (ref 4.0–10.5)
nRBC: 0 % (ref 0.0–0.2)

## 2019-06-27 LAB — GLUCOSE, CAPILLARY
Glucose-Capillary: 189 mg/dL — ABNORMAL HIGH (ref 70–99)
Glucose-Capillary: 181 mg/dL — ABNORMAL HIGH (ref 70–99)
Glucose-Capillary: 272 mg/dL — ABNORMAL HIGH (ref 70–99)

## 2019-06-27 MED ORDER — PREDNISONE 20 MG PO TABS
40.0000 mg | ORAL_TABLET | Freq: Every day | ORAL | Status: DC
  Administered 2019-06-28: 40 mg via ORAL
  Filled 2019-06-27: qty 2

## 2019-06-27 MED ORDER — ENOXAPARIN SODIUM 60 MG/0.6ML ~~LOC~~ SOLN
50.0000 mg | Freq: Every day | SUBCUTANEOUS | Status: DC
  Administered 2019-06-27 – 2019-06-28 (×2): 50 mg via SUBCUTANEOUS
  Filled 2019-06-27 (×2): qty 0.6

## 2019-06-27 MED ORDER — INSULIN GLARGINE 100 UNIT/ML ~~LOC~~ SOLN
5.0000 [IU] | Freq: Every day | SUBCUTANEOUS | Status: DC
  Administered 2019-06-27 – 2019-06-28 (×2): 5 [IU] via SUBCUTANEOUS
  Filled 2019-06-27 (×2): qty 0.05

## 2019-06-27 NOTE — TOC Initial Note (Signed)
Transition of Care [facility]) - Initial/Assessment Note    Patient Details  Name: Holly Mueller MRN: [id number] Date of Birth: January 24, 1966  Transition of [facility]) CM/SW Contact:    Carles Collet, RN Phone Number: 06/27/2019, 11:30 AM  Clinical Narrative:                Patient from home w sister. Has home O2 through [employer]. [facility] following for palliative services.     Expected Discharge Plan: [place] Barriers to Discharge: Continued Medical Work up   Patient Goals and CMS Choice        Expected Discharge Plan and Services Expected Discharge Plan: [place]                                              Prior Living Arrangements/Services                       Activities of Daily Living Home Assistive Devices/Equipment: Eyeglasses, Oxygen(uses brother walker) ADL Screening (condition at time of admission) Patient's cognitive ability adequate to safely complete daily activities?: No Is the patient deaf or have difficulty hearing?: No Does the patient have difficulty seeing, even when wearing glasses/contacts?: No Does the patient have difficulty concentrating, remembering, or making decisions?: No Patient able to express need for assistance with ADLs?: No Does the patient have difficulty dressing or bathing?: Yes Independently performs ADLs?: Yes (appropriate for developmental age) Does the patient have difficulty walking or climbing stairs?: Yes Weakness of Legs: None Weakness of Arms/Hands: None  Permission Sought/Granted                  Emotional Assessment              Admission diagnosis:  COPD exacerbation ([place]) [J44.1] Patient Active Problem List   Diagnosis Date Noted  . Interstitial pulmonary fibrosis ([place]) 06/26/2019  . Acute on chronic respiratory failure with hypoxemia ([place]) 06/26/2019  . It band syndrome, left 06/16/2019  . Rectal bleeding 06/16/2019  . On supplemental oxygen  therapy   . Nodule of right lung   . Respiratory distress   . Community acquired pneumonia of left lower lobe of lung 05/22/2019  . Petechiae 05/01/2019  . Lower extremity edema 05/01/2019  . Irritant contact dermatitis due to detergent 03/31/2019  . Melena 03/31/2019  . Financial difficulties 03/31/2019  . Skin rash 03/17/2019  . Intertrigo 02/11/2019  . Dysuria 02/11/2019  . Chronic respiratory failure with hypoxia ([place]) 01/11/2019  . Inadequate housing 12/05/2018  . Primary biliary cholangitis ([place]) 10/24/2018  . Gastric varices with bleeding   . Anemia 09/22/2018  . Pancytopenia ([place]) 08/28/2018  . Does not have health insurance 04/04/2018  . ILD (interstitial lung disease) ([place]) 03/23/2018  . Type 2 diabetes mellitus without complication, without long-term current use of insulin ([place])   . 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 GERD (gastroesophageal reflux disease) 10/23/2016  . Chronic cough 12/09/2015  . Precordial chest pain 08/03/2015  . DOE (dyspnea on exertion) 08/17/2013  . Ganglion cyst of wrist 08/24/2012  . Right hip pain 08/23/2012  . Complex care coordination 02/14/2012  . Obesity 07/08/2010  . Shortness of breath 12/12/2009  . Depression 07/26/2008  . Hypothyroidism 10/12/2006  . Restrictive lung disease 07/30/2006   PCP:  Meccariello, Bernita Raisin, DO Pharmacy:   [place], [place] - [address] [facility]. [address] [place] [address] Phone: [phone] Fax: [phone]     Social Determinants of Health (SDOH) Interventions    Readmission Risk Interventions Readmission Risk Prevention Plan 11/24/2018  Transportation Screening Complete  PCP or Specialist Appt within 3-5 Days Complete  HRI or [place] Complete  Social Work Consult for [place] Planning/Counseling Complete  Palliative Care Screening Not Applicable  Medication Review Press photographer)  Complete  Some recent data might be hidden

## 2019-06-27 NOTE — Progress Notes (Signed)
Family Medicine Teaching Service Daily Progress Note Intern Pager: [phone]  Patient name: Holly Mueller Medical record number: [id number] Date of birth: [birth year] Age: 54 y.o. Gender: female  Primary Care Provider: Cleophas Dunker, DO Consultants: N/A Code Status: FULL  Pt Overview and Major Events to Date:  2/15 Admitted  Assessment and Plan: Delberta Folts is a 54 y.o. female presenting with dyspnea on exertion. PMH is significant for COPD/ILD, Hypothyroidism, pancytopenia,Primary Biliary Cholangitis/Cirrhosis with esophageal varices, Diet controlled T2DM, Depression.   Acute exacerbation of IPF Continues to be dyspneic on exertion but Satting 96% 2L [place] which is her home oxygen requirement and no documented desatruations. BNP 27.6, does not appear to be fluid up except for LE edema present. EKG QTc 450. PT/OT recommends home health PT/OT -AM CBC, BMP -Tylenol 650 mg q6h PRN -Albuterol neb q2h PRN, Duoneb q4h -Azithromycin x5 days -Prednisone 68m  -cardiac monitring, continuous pulse ox -PT/OT eval and treat -vitals per protocol -strict Is & Os  COPD/ILD Last PFT 05/24/2019 which indicated air trapping and loss of functional alveolar capillary surface and the diffusing capacity not corrected for patients hemoglobin. O2 sats on 2 L [place] 96%. Has dyspnea on exertion and with conversation. CXR w/ bibasilar atelectasis. Interstitial thickening may reflect chronic inflammatory type change. ABG: pH 7.4, pCO2 31.5, pO2 95, bicarb 19.6, acid-base deficit 4. Home medications include albuterol inhaler/nebulizer, symbicort, spiriva. Endorses compliance with all medications. -Albuterol neb q2h PRN  -Scheduled duoneb q4h -Azithromycin x 5 days -Prednisone 439mqd -PT/OT eval and treat -interstitial lung disease  -Maintain O2 saturation of 88-92%  Diet controlled T2DM Last Hgb A1c 5.8 05/23/2019. Home medication is Metformin 50038mID. Glu on BMP is 272. -Continue home  metformin  -start lantus 5 Units -CBGs TID ac & hs   Hypothyroidism TSH 11/21/2018 was 0.031. Does endorse fatigue and could be multifactorial due to respiratory status and low TSH. Has intermittent constipation and has bowel regimen. She is not bradycardic HR range on admission is 84-99. Home medication is levothyroxine 125m64mID. -Continue home medications  Pancytopenia. Stable.  On admission WBC 1.1, RBC 3.39, plt 62>56. These labs are consistent with her baseline. Does report fever on admission not likely to be reflexed with normal response of cell lines. She is followed by Heme-Onc, Dr. DorsLorenso Courierst office visit 06/01/2019 etiology was suggested to be likely worsening cirrhosis. A bone marrow biopsy was performed on 12/02/2016 which showed slightly hypercellular trilineage monoparesis with no significant increase in blast cells or dyspoiesis noted. As such at that time it was thought that her hematological findings were secondary to her cirrhosis. -Monitor fever curve  -AM CBC -Hepatic panel in the am -Will monitor platelets closely--- discontinue pharmacologic VTE if platelets <50,000.      Primary Biliary Cholangitis/Cirrhosis with esophageal varices LFTs today AST/ALT 44/28. No GI issues at time of admission other than hemorrhoids that she was treating with preparation H. Hgb on admission was 8.8. Baseline is 8.8-10.3. She is followed by [facility] w/ Dr. CarlSilvano Ruskst office visit 03/17/2019 and was told to continue home medication ursodiol and a cyanoacrylate injection and ablation was completed in 04/26/2019. -Continue ursodiol  600mg27m -Monitor hemoglobin on AM CBC -f/u LFTs  Depression Not on home medications. [facility] November with a PHQ-9 score of 11 was given therapist list, did not have SI, and did not want medication treatment at that time.  -Continue to monitor mood  GERD -Continue protonix 23m BID   FEN/GI: Modified-carb  diet, Protonix 40 BID  Prophylaxis: Lovenox  Disposition: Likely home tomorrow   Subjective:  She continues to be short of breath when attempting to ambulate across the room.  Objective: Temp:  [98.1 F (36.7 C)-98.3 F (36.8 C)] 98.3 F (36.8 C) (02/15 2320) Pulse Rate:  [83-100] 94 (02/15 2320) Resp:  [16-26] 22 (02/15 1830) BP: (100-128)/(53-96) 110/71 (02/15 2320) SpO2:  [96 %-100 %] 96 % (02/15 2320) Weight:  [111.1 kg] 111.1 kg (02/15 1356) Physical Exam:  General: Appears well, no acute distress. Age appropriate. Sitting in bedside chair.  Cardiac: RRR, normal heart sounds, no murmurs Respiratory: CTAB upper lung fields, dry crackles at lung bases, increased effort Extremities: Bilateral lower ext. Pitting edema Skin: Warm and dry, no rashes noted Neuro: alert and oriented, no focal deficits Psych: normal affect  Laboratory: Recent Labs  Lab 06/26/19 1253 06/26/19 1253 06/26/19 1739 06/26/19 1755 06/27/19 0616  WBC 1.1*  --   --  1.0* 1.4*  HGB 8.8*   < > 10.5* 9.1* 8.5*  HCT 29.2*   < > 31.0* 29.4* 26.6*  PLT 62*  --   --  62* 56*   < > = values in this interval not displayed.   Recent Labs  Lab 06/26/19 1253 06/26/19 1739 06/26/19 1755 06/27/19 0616  NA 136 141  --  131*  K 3.9 4.1  --  4.4  CL 109  --   --  106  CO2 19*  --   --  17*  BUN 8  --   --  14  CREATININE 0.64  --  0.60 0.70  CALCIUM 8.7*  --   --  8.6*  PROT  --   --   --  6.3*  BILITOT  --   --   --  1.7*  ALKPHOS  --   --   --  110  ALT  --   --   --  28  AST  --   --   --  44*  GLUCOSE 133*  --   --  272*    Imaging/Diagnostic Tests: Now new imaging  AGerlene Fee DO 06/27/2019, 2:01 PM PGY-1, CHollywood ParkIntern pager: [phone] text pages welcome

## 2019-06-27 NOTE — Discharge Summary (Signed)
[facility] Discharge Summary  Patient name: Holly Mueller Medical record number: [id number] Date of birth: 1965/12/23 Age: 54 y.o. Gender: female Date of Admission: 06/26/2019  Date of Discharge: 06/28/2019 Admitting Physician: Martyn Malay, MD  Primary Care Provider: Cleophas Dunker, DO Consultants: N/A  Indication for Hospitalization: Dyspnea on exertion  Discharge Diagnoses/Problem List:  COPD ILD Diet-controlled type 2 diabetes Hypothyroidism Pancytopenia Married biliary cholangitis Cirrhosis with esophageal varices Depression GERD  Disposition: Home with home health  Discharge Condition: Stable.  Discharge Exam:   General: Appears well, no acute distress. Age appropriate. Sitting in bedside chair.  Cardiac: RRR, normal heart sounds, no murmurs Respiratory: CTAB upper lung fields, dry crackles at lung bases, increased effort Extremities: Bilateral lower ext. Pitting edema Skin: Warm and dry, no rashes noted Neuro: alert and oriented, no focal deficits Psych: normal affect  Brief Hospital Course:  Holly Forgione Sumneris a 54 y.o.femalepresenting with dyspnea on exertion. PMH is significant forCOPD/ILD, Hypothyroidism, pancytopenia,Primary Biliary Cholangitis/Cirrhosis with esophageal varices, Diet controlled T2DM, Depression.  Acute exacerbation of IPF Acute on Chronic, worsening. Presented to the ED this afternoon after an office visit virtually at her pulmonologist and had a desaturation to the 80s at home.  Experienced worsening dyspnea for at least 1 week with decreased ability to walk to her kitchen without becoming short of breath. She has also required 4 pillows to sleep at night but has not had to increase her home oxygen of 2L. Febrile, Tmax 100.1 at home and had productive cough of yellow mucus that has since become more dry. Also with lower extremity pitting edema up to the patellar region.  In the ED she was  requiring 3L [place] with an O2 saturation of 99% with increased respirations. Her weight was up almost 2kg since last admission. WBC 1, RBC 3, Plt 62. Glu 133. Cr 0.60. Rapid COVID negative. 6-24hr COVID negative. ABG: pH 7.4, pCO2 31.5, pO2 95, bicarb 19, acid-base deficit 4. BNP27. CXR w/ bibasilar atelectasis and chronic inflammatory changes. Received albuterol 8 puffs x1 and solu-medrol x1.  During admission she was started on a 5 day course of azithromycin and prednisone 50 mg initially that was decreased to 40 mg. Her respiratory status continued to improve and was not febrile.  She was continued on scheduled duo nebs as well as albuterol as needed.  O2 was weaned to home oxygen of 2 L and patient was satting 93% on room air at time of discharge.  No recorded desaturations during entire admission.  Diet controlled T2DM Continued home metformin. Started on 5 units of Lantus in her admission but was not continued at time of discharge.  Pancytopenia. Stable. Platelets monitor closely for discontinuation of VTE.  Platelets at time of discharge was 63.  Primary Biliary Cholangitis/Cirrhosis with esophageal varices. Stable. Continued on your ursodiol.  LFTs were monitored during this admission and were stable.  Issues for Follow Up:  1. Continue to follow up with heme-onc and GI 2. Reiterate the need for daily maintenance inhalers.   Significant Procedures: N/A  Significant Labs and Imaging:  Recent Labs  Lab 06/26/19 1755 06/27/19 0616 06/28/19 0405  WBC 1.0* 1.4* 1.8*  HGB 9.1* 8.5* 8.2*  HCT 29.4* 26.6* 25.8*  PLT 62* 56* 63*   Recent Labs  Lab 06/26/19 1253 06/26/19 1253 06/26/19 1739 06/26/19 1739 06/26/19 1755 06/27/19 0616 06/28/19 0405  NA 136  --  141  --   --  131* 139  K 3.9   < >  4.1   < >  --  4.4 4.0  CL 109  --   --   --   --  106 110  CO2 19*  --   --   --   --  17* 21*  GLUCOSE 133*  --   --   --   --  272* 168*  BUN 8  --   --   --   --  14 14  CREATININE  0.64  --   --   --  0.60 0.70 0.63  CALCIUM 8.7*  --   --   --   --  8.6* 8.7*  ALKPHOS  --   --   --   --   --  110 98  AST  --   --   --   --   --  44* 31  ALT  --   --   --   --   --  28 25  ALBUMIN  --   --   --   --   --  2.3* 2.3*   < > = values in this interval not displayed.   CHEST - 2 VIEW  COMPARISON:  May 23 2019  FINDINGS: There is atelectasis in the bases. There is mild generalized interstitial thickening. The lungs elsewhere are clear. Heart size and pulmonary vascularity are normal. No adenopathy. No bone lesions. No pneumothorax.  IMPRESSION: Bibasilar atelectasis. Interstitial thickening may reflect chronic inflammatory type change. No edema or airspace opacity. Cardiac silhouette within normal limits. No adenopathy.   Results/Tests Pending at Time of Discharge: N/a  Discharge Medications:  Allergies as of 06/28/2019      Reactions   Aspirin Nausea Only, Other (See Comments)   Upset stomach      Medication List    TAKE these medications   albuterol 108 (90 Base) MCG/ACT inhaler Commonly known as: ProAir HFA INHALE 2 PUFFS INTO THE LUNGS EVERY 6 HOURS AS NEEDED FOR SHORTNESS OF BREATH What changed:   how much to take  how to take this  when to take this  reasons to take this  additional instructions   albuterol (2.5 MG/3ML) 0.083% nebulizer solution Commonly known as: PROVENTIL Take 3 mLs (2.5 mg total) by nebulization every 6 (six) hours as needed for wheezing or shortness of breath. What changed: Another medication with the same name was changed. Make sure you understand how and when to take each.   APAP 325 MG tablet Take 2 tablets (650 mg total) by mouth every 6 (six) hours as needed.   azithromycin 250 MG tablet Commonly known as: ZITHROMAX Take 1 tablet by mouth once daily for 2 days to complete antibiotic course that was started in the hospital. Start taking on: June 29, 2019   blood glucose meter kit and  supplies Dispense based on patient and insurance preference. Use up to four times daily as directed. (FOR ICD-10 E10.9, E11.9).   budesonide-formoterol 80-4.5 MCG/ACT inhaler Commonly known as: Symbicort Inhale 2 puffs into the lungs 2 (two) times daily for 1 day.   cetirizine 10 MG tablet Commonly known as: ZYRTEC Take 1 tablet (10 mg total) by mouth daily.   dextromethorphan-guaiFENesin 30-600 MG 12hr tablet Commonly known as: MUCINEX DM Take 1-2 tablets by mouth at bedtime as needed for cough.   diclofenac Sodium 1 % Gel Commonly known as: VOLTAREN Apply 4 g topically 4 (four) times daily as needed (pain).   fluticasone 50 MCG/ACT nasal spray Commonly  known as: FLONASE Place 2 sprays into both nostrils in the morning and at bedtime.   glucose blood test strip Commonly known as: True Metrix Blood Glucose Test Use as instructed   levothyroxine 125 MCG tablet Commonly known as: SYNTHROID Take 1 tablet (125 mcg total) by mouth 2 (two) times daily.   metFORMIN 500 MG tablet Commonly known as: GLUCOPHAGE Take 1 tablet (500 mg total) by mouth 2 (two) times daily with a meal.   OXYGEN Inhale 2 L into the lungs continuous.   pantoprazole 40 MG tablet Commonly known as: PROTONIX Take 1 tablet (40 mg total) by mouth 2 (two) times daily before a meal.   predniSONE 20 MG tablet Commonly known as: DELTASONE Take 2 tablets (40 mg total) by mouth daily with breakfast for 3 days. Start taking on: June 29, 2019   Spiriva HandiHaler 18 MCG inhalation capsule Generic drug: tiotropium INHALE 1 CAPSULE VIA HANDIHALER ONCE DAILY AT THE SAME TIME EVERY DAY What changed: See the new instructions.   TRUEplus Lancets 28G Misc Use to check blood sugar as directed.   ursodiol 300 MG capsule Commonly known as: ACTIGALL Take 2 capsules (600 mg total) by mouth 3 (three) times daily.       Discharge Instructions: Please refer to Patient Instructions section of EMR for full  details.  Patient was counseled important signs and symptoms that should prompt return to medical care, changes in medications, dietary instructions, activity restrictions, and follow up appointments.   Follow-Up Appointments:   Future Appointments  Date Time Provider [place]  07/04/2019  2:10 PM ACCESS TO CARE POOL [facility]  07/05/2019 11:10 AM Gatha Mayer, MD [facility]  07/10/2019 11:30 AM [facility] MANAGER [facility] [place]  07/13/2019 11:30 AM Brand Males, MD [facility]ULCARE None  07/19/2019  1:30 PM Cleophas Dunker, DO [facility]  11/29/2019  9:30 AM [facility] LAB 6 [facility] None  11/29/2019 10:00 AM Orson Slick, MD [facility] None     Autry-Lott, [place], DO 06/27/2019, 5:53 PM PGY-1, [place]

## 2019-06-27 NOTE — Evaluation (Signed)
Physical Therapy Evaluation Patient Details Name: Holly Mueller MRN: [id number] DOB: [birth year] Today's Date: 06/27/2019   History of Present Illness  54 year old with history of DM, asthma, COPD/ILD on 2L home oxygen, ideopathic pulmonary fibrosis (IPF), primary bilary cholangitis/cirrhosis and hypothyroidism presenting with cough, dyspnea, and orthopnea, most likely acute exacerbation of IPF  Clinical Impression   Pt admitted with above diagnosis. Patient with limited activity tolerance due to dyspnea and decr sats while using 2L [place] O2. She has difficulty using pursed lip breathing technique to help control RR and maintain/improve oxygen saturation. She describes areas of home (in particular her bathroom) where her oxygen tubing does not reach and so she simply removes oxygen to use bathroom or shower.  Pt currently with functional limitations due to the deficits listed below (see PT Problem List). Pt will benefit from skilled PT to increase their independence and safety with mobility to allow discharge to the venue listed below.       Follow Up Recommendations Home health PT;Supervision - Intermittent    Equipment Recommendations  None recommended by PT    Recommendations for Other Services       Precautions / Restrictions Precautions Precautions: Fall Precaution Comments: monitor O2 sats Restrictions Weight Bearing Restrictions: No      Mobility  Bed Mobility Overal bed mobility: Needs Assistance Bed Mobility: Supine to Sit     Supine to sit: Supervision;HOB elevated     General bed mobility comments: no assist required, supervision for safety/lines  Transfers Overall transfer level: Needs assistance Equipment used: None Transfers: Sit to/from Stand Sit to Stand: Supervision         General transfer comment: for safety;  pt using bedrail to boost from EOB  Ambulation/Gait Ambulation/Gait assistance: Supervision;+2 safety/equipment Gait Distance (Feet):  100 Feet(standing rest; 50 ft) Assistive device: None Gait Pattern/deviations: Step-through pattern;Decreased stride length     General Gait Details: slower pace due to dyspnea; instructed pt not to talk while walking and she adhered to this; encouraged to take standing rest breaks if needed (which she took after 100 ft with sats 89% on 2L, required ~2 minutes of pursed lip breathing with max cues to increase to 91%  Stairs            Wheelchair Mobility    Modified Rankin (Stroke Patients Only)       Balance Overall balance assessment: No apparent balance deficits (not formally assessed)(with fatigue she propped agains wall to recover; but no imba)                                           Pertinent Vitals/Pain Pain Assessment: No/denies pain    Home Living Family/patient expects to be discharged to:: Private residence Living Arrangements: Other (Comment)(brother/sister) Available Help at Discharge: Family;Available PRN/intermittently Type of Home: Mobile home Home Access: Stairs to enter Entrance Stairs-Rails: Right;Left;Can reach both Entrance Stairs-Number of Steps: 4-5 Home Layout: One level Home Equipment: Shower seat;Adaptive equipment;Walker - 2 wheels Additional Comments: has home O2, reports doesn't reach to all rooms in her home including her bathroom (so she takes it off when goes to bathroom; notes sats down to 82% when she returns from bathroom)    Prior Function Level of Independence: Independent         Comments: brother and sister assist with driving, take pt to grocery store  Hand Dominance   Dominant Hand: Right    Extremity/Trunk Assessment   Upper Extremity Assessment Upper Extremity Assessment: Defer to OT evaluation    Lower Extremity Assessment Lower Extremity Assessment: Overall WFL for tasks assessed       Communication   Communication: No difficulties  Cognition Arousal/Alertness: Awake/alert Behavior  During Therapy: WFL for tasks assessed/performed Overall Cognitive Status: Within Functional Limits for tasks assessed                                 General Comments: overall WFL, however decreased insight into importance/need for continuous wear of O2 - pt reports she mobilizes to BR, completes toileting and showers without supplemental O2 due to O2 lines not reaching into her bathroom; notes sats down to 82% when she recently did this      General Comments General comments (skin integrity, edema, etc.): Pt on 2L during session completion with spO2 89-92% with mobility/activity, max HR noted 120 bpm post hallway mobility. Educated on importance of using O2 at all times at home (not removing it to go to the bathroom) and coordinating with home O2 company for a solution or at least using portable tank to get to bathroom    Exercises Other Exercises Other Exercises: educated in pursed lip breathing for recovering O2 sats; pt with difficulty with pursing lips and blowing and max cues to slow her breathing and use technique   Assessment/Plan    PT Assessment Patient needs continued PT services  PT Problem List Decreased activity tolerance;Decreased mobility;Decreased safety awareness;Decreased knowledge of precautions;Cardiopulmonary status limiting activity       PT Treatment Interventions Gait training;Functional mobility training;Therapeutic activities;Therapeutic exercise;Patient/family education    PT Goals (Current goals can be found in the Care Plan section)  Acute Rehab PT Goals Patient Stated Goal: maintain her independence PT Goal Formulation: With patient Time For Goal Achievement: 07/11/19 Potential to Achieve Goals: Good    Frequency Min 3X/week   Barriers to discharge        Co-evaluation PT/OT/SLP Co-Evaluation/Treatment: Yes Reason for Co-Treatment: Other (comment);To address functional/ADL transfers(pt with limited activity tolerance) PT goals  addressed during session: Mobility/safety with mobility;Balance OT goals addressed during session: ADL's and self-care       AM-PAC PT "6 Clicks" Mobility  Outcome Measure Help needed turning from your back to your side while in a flat bed without using bedrails?: None Help needed moving from lying on your back to sitting on the side of a flat bed without using bedrails?: A Little Help needed moving to and from a bed to a chair (including a wheelchair)?: None Help needed standing up from a chair using your arms (e.g., wheelchair or bedside chair)?: None Help needed to walk in hospital room?: A Little Help needed climbing 3-5 steps with a railing? : A Little 6 Click Score: 21    End of Session Equipment Utilized During Treatment: Oxygen Activity Tolerance: Patient limited by fatigue Patient left: in chair;with call bell/phone within reach;with chair alarm set   PT Visit Diagnosis: Difficulty in walking, not elsewhere classified (R26.2)    Time: 2500-3704 PT Time Calculation (min) (ACUTE ONLY): 36 min   Charges:   PT Evaluation $PT Eval Moderate Complexity: 1 Mod           Arby Barrette, PT Pager [phone]   Rexanne Mano 06/27/2019, 10:58 AM

## 2019-06-27 NOTE — Evaluation (Addendum)
Occupational Therapy Evaluation Patient Details Name: Holly Mueller MRN: [id number] DOB: [birth year] Today's Date: 06/27/2019    History of Present Illness 54 year old with history of DM, asthma, COPD/ILD on 2L home oxygen, ideopathic pulmonary fibrosis (IPF), primary bilary cholangitis/cirrhosis and hypothyroidism presenting with cough, dyspnea, and orthopnea, most likely acute exacerbation of IPF   Clinical Impression   This 54 y/o female presents with the above. PTA pt reports independence with ADL and mobility, reports lives with her brother and sister. Pt overall presenting with generalized fatigue and decreased activity tolerance. Pt completing room and hallway level mobility without AD initially with supervision progressed to minguard assist as pt becomes increasingly fatigued. Pt with DOE 3-4/4 with standing activity as well as with talking while seated EOB. Pt reports she uses supplemental O2 at home, however reports her O2 lines do not reach into her bathroom, stating she mobilizes to bathroom for toileting and bathing without wearing O2 (despite pt reports of O2 sats dropping <90% without supplemental O2). Educated on the importance of wearing her O2 especially with ADL tasks such as bathing, discussed need to talk with agency which provides oxygen for obtaining O2 extension/additional strategies for ensuring O2 available in bathroom as well. O2 sats maintaining 89-92% on 2L O2 during activity this session. She will benefit from continued acute OT services and currently recommend follow up [facility] services after discharge to maximize her safety and independence with ADL and mobility. Will follow.     Follow Up Recommendations  Home health OT;Supervision - Intermittent    Equipment Recommendations  None recommended by OT(pt's DME needs are met)           Precautions / Restrictions Precautions Precautions: Fall Precaution Comments: monitor O2 sats Restrictions Weight Bearing  Restrictions: No      Mobility Bed Mobility Overal bed mobility: Needs Assistance Bed Mobility: Supine to Sit     Supine to sit: Supervision;HOB elevated     General bed mobility comments: no assist required, supervision for safety  Transfers Overall transfer level: Needs assistance Equipment used: None Transfers: Sit to/from Stand Sit to Stand: Supervision         General transfer comment: for safety and balance to rise from EOB, pt using bedrail to boost from EOB    Balance Overall balance assessment: Mild deficits observed, not formally tested(mild deficits noted when increasingly fatigued)                                         ADL either performed or assessed with clinical judgement   ADL Overall ADL's : Needs assistance/impaired Eating/Feeding: Modified independent;Sitting   Grooming: Set up;Sitting   Upper Body Bathing: Set up;Sitting   Lower Body Bathing: Min guard;Sit to/from stand;With adaptive equipment   Upper Body Dressing : Set up;Sitting   Lower Body Dressing: Min guard;Sit to/from stand;With adaptive equipment Lower Body Dressing Details (indicate cue type and reason): with use of AE Toilet Transfer: Min guard;Supervision/safety;Ambulation Toilet Transfer Details (indicate cue type and reason): simulated via transfer to recliner, room/hallway mobility Toileting- Clothing Manipulation and Hygiene: Min guard;Sit to/from stand       Functional mobility during ADLs: Supervision/safety;Min guard General ADL Comments: supervision initially progressed to minguard as pt fatigues, pt with DOE 3-4/4 with hallway level activity, cued for deep breathing techniques. discussed options/strategies to ensure pt's O2 lines reach in all rooms of her home  as she currently reports she goes to the bathrom and showers without O2 (despite O2 sats typically dropping <90% without O2)                         Pertinent Vitals/Pain Pain Assessment:  No/denies pain     Hand Dominance Right   Extremity/Trunk Assessment Upper Extremity Assessment Upper Extremity Assessment: Overall WFL for tasks assessed(gross weakness but functional)   Lower Extremity Assessment Lower Extremity Assessment: Defer to PT evaluation       Communication Communication Communication: No difficulties   Cognition Arousal/Alertness: Awake/alert Behavior During Therapy: WFL for tasks assessed/performed Overall Cognitive Status: Within Functional Limits for tasks assessed                                 General Comments: overall WFL, however decreased insight into importance/need for continuous wear of O2 - pt reports she mobilizes to BR, completes toileting and showers without supplemental O2 due to O2 lines not reaching into her bathroom    General Comments  Pt on 2L during session completion with spO2 89-92% with mobility/activity, max HR noted 120 bpm post hallway mobility    Exercises     Shoulder Instructions      Home Living Family/patient expects to be discharged to:: Private residence Living Arrangements: Other (Comment)(brother/sister) Available Help at Discharge: Family;Available PRN/intermittently Type of Home: Mobile home Home Access: Stairs to enter Entrance Stairs-Number of Steps: 4-5 Entrance Stairs-Rails: Right;Left;Can reach both Home Layout: One level     Bathroom Shower/Tub: Occupational psychologist: Handicapped height     Home Equipment: Shower seat;Adaptive equipment;Walker - 2 wheels Adaptive Equipment: Reacher;Sock aid;Long-handled sponge;Long-handled shoe horn Additional Comments: has home O2, reports doesn't reach to all rooms in her home      Prior Functioning/Environment Level of Independence: Independent        Comments: brother and sister assist with driving, take pt to grocery store         OT Problem List: Decreased strength;Decreased activity tolerance;Impaired balance  (sitting and/or standing);Decreased knowledge of use of DME or AE;Cardiopulmonary status limiting activity;Obesity;Decreased safety awareness      OT Treatment/Interventions: Self-care/ADL training;Therapeutic exercise;Energy conservation;DME and/or AE instruction;Therapeutic activities;Patient/family education;Balance training    OT Goals(Current goals can be found in the care plan section) Acute Rehab OT Goals Patient Stated Goal: maintain her independence OT Goal Formulation: With patient Time For Goal Achievement: 07/11/19 Potential to Achieve Goals: Good  OT Frequency: Min 2X/week   Barriers to D/C:            Co-evaluation PT/OT/SLP Co-Evaluation/Treatment: Yes Reason for Co-Treatment: To address functional/ADL transfers(pt with decreased activity tolerance)   OT goals addressed during session: ADL's and self-care      AM-PAC OT "6 Clicks" Daily Activity     Outcome Measure Help from another person eating meals?: None Help from another person taking care of personal grooming?: None Help from another person toileting, which includes using toliet, bedpan, or urinal?: A Little Help from another person bathing (including washing, rinsing, drying)?: A Little Help from another person to put on and taking off regular upper body clothing?: None Help from another person to put on and taking off regular lower body clothing?: A Little 6 Click Score: 21   End of Session Equipment Utilized During Treatment: Gait belt;Oxygen Nurse Communication: Mobility status  Activity Tolerance: Patient tolerated treatment  well Patient left: in chair;with call bell/phone within reach;with chair alarm set  OT Visit Diagnosis: Other abnormalities of gait and mobility (R26.89);Other (comment)(decreased activity tolerance)                Time: 5498-2641 OT Time Calculation (min): 35 min Charges:  OT General Charges $OT Visit: 1 Visit OT Evaluation $OT Eval Moderate Complexity: 1 Mod  Lou Cal, OT [employer] Pager [phone] Office [phone]  Raymondo Band 06/27/2019, 10:33 AM

## 2019-06-27 NOTE — Progress Notes (Signed)
EKG performed and copy placed in chart

## 2019-06-28 DIAGNOSIS — K743 Primary biliary cirrhosis: Secondary | ICD-10-CM

## 2019-06-28 DIAGNOSIS — J984 Other disorders of lung: Secondary | ICD-10-CM

## 2019-06-28 LAB — GLUCOSE, CAPILLARY
Glucose-Capillary: 152 mg/dL — ABNORMAL HIGH (ref 70–99)
Glucose-Capillary: 179 mg/dL — ABNORMAL HIGH (ref 70–99)

## 2019-06-28 LAB — HEPATIC FUNCTION PANEL
ALT: 25 U/L (ref 0–44)
AST: 31 U/L (ref 15–41)
Albumin: 2.3 g/dL — ABNORMAL LOW (ref 3.5–5.0)
Alkaline Phosphatase: 98 U/L (ref 38–126)
Bilirubin, Direct: 0.4 mg/dL — ABNORMAL HIGH (ref 0.0–0.2)
Indirect Bilirubin: 0.9 mg/dL (ref 0.3–0.9)
Total Bilirubin: 1.3 mg/dL — ABNORMAL HIGH (ref 0.3–1.2)
Total Protein: 6.1 g/dL — ABNORMAL LOW (ref 6.5–8.1)

## 2019-06-28 LAB — CBC
HCT: 25.8 % — ABNORMAL LOW (ref 36.0–46.0)
Hemoglobin: 8.2 g/dL — ABNORMAL LOW (ref 12.0–15.0)
MCH: 26.5 pg (ref 26.0–34.0)
MCHC: 31.8 g/dL (ref 30.0–36.0)
MCV: 83.5 fL (ref 80.0–100.0)
Platelets: 63 10*3/uL — ABNORMAL LOW (ref 150–400)
RBC: 3.09 MIL/uL — ABNORMAL LOW (ref 3.87–5.11)
RDW: 15.3 % (ref 11.5–15.5)
WBC: 1.8 10*3/uL — ABNORMAL LOW (ref 4.0–10.5)
nRBC: 0 % (ref 0.0–0.2)

## 2019-06-28 LAB — BASIC METABOLIC PANEL
Anion gap: 8 (ref 5–15)
BUN: 14 mg/dL (ref 6–20)
CO2: 21 mmol/L — ABNORMAL LOW (ref 22–32)
Calcium: 8.7 mg/dL — ABNORMAL LOW (ref 8.9–10.3)
Chloride: 110 mmol/L (ref 98–111)
Creatinine, Ser: 0.63 mg/dL (ref 0.44–1.00)
GFR calc Af Amer: >60 mL/min (ref >60)
GFR calc non Af Amer: >60 mL/min (ref >60)
Glucose, Bld: 168 mg/dL — ABNORMAL HIGH (ref 70–99)
Potassium: 4 mmol/L (ref 3.5–5.1)
Sodium: 139 mmol/L (ref 135–145)

## 2019-06-28 MED ORDER — PREDNISONE 20 MG PO TABS: 40.0000 mg | ORAL_TABLET | Freq: Every day | ORAL | 0 refills | Status: AC

## 2019-06-28 MED ORDER — LEVOTHYROXINE SODIUM 125 MCG PO TABS: 125.0000 ug | ORAL_TABLET | Freq: Two times a day (BID) | ORAL | Status: DC

## 2019-06-28 MED ORDER — AZITHROMYCIN 250 MG PO TABS: ORAL_TABLET | ORAL | 0 refills | Status: AC

## 2019-06-28 MED FILL — predniSONE 20 MG TABS: 20 | 3 days supply | Qty: 6 | Fill #0

## 2019-06-28 MED FILL — AZITHROMYCIN 250 MG TABLET: 250 | 2 days supply | Qty: 2 | Fill #0

## 2019-06-28 NOTE — TOC Transition Note (Signed)
Transition of Care [facility]) - CM/SW Discharge Note   Patient Details  Name: Holly Mueller MRN: [id number] Date of Birth: 1965-07-09  [facility] CM/SW Contact:  Carles Collet, RN Phone Number: 06/28/2019, 12:15 PM   Clinical Narrative:    Unable to find [facility] provider who would accept insurance. Outpatient referrals placed for PT OT, MD aware. I have also discussed this with her sister, with whom she lives and she verifies that she can get her there. Requested [employer] to deliver loger oxygen tubing to her house. SHe has portable O2 for ride home, sister or brother will provide transport.  [facility] will follow for palliative as established PTA.       Barriers to Discharge: Continued Medical Work up   Patient Goals and CMS Choice        Discharge Placement                       Discharge Plan and Services                                     Social Determinants of Health (SDOH) Interventions     Readmission Risk Interventions Readmission Risk Prevention Plan 11/24/2018  Transportation Screening Complete  PCP or Specialist Appt within 3-5 Days Complete  HRI or [place] Complete  Social Work Consult for [place] Planning/Counseling Complete  Palliative Care Screening Not Applicable  Medication Review Press photographer) Complete  Some recent data might be hidden

## 2019-06-28 NOTE — Discharge Instructions (Signed)
Dear Holly Mueller,   POST-HOSPITAL & CARE INSTRUCTIONS 1. Continue taking azithromycin and prednisone for 3 more days 2. Go to your follow up appointments (listed below)   DOCTOR'S APPOINTMENT   Future Appointments  Date Time Provider [place]  06/30/2019 11:00 AM [facility] PFT ROOM [facility] None  07/05/2019 11:10 AM Gatha Mayer, MD [facility]  07/10/2019 11:30 AM [facility]-CASE MANAGER [facility] [place]  07/13/2019 11:30 AM Brand Males, MD [facility]PULC[facility] None  07/19/2019  1:30 PM Cleophas Dunker, DO [facility]  11/29/2019  9:30 AM [facility] LAB 6 [facility] None  11/29/2019 10:00 AM Orson Slick, MD [facility] None     Take care and be well!  [facility]  [place], [place] [address] [phone]  What we know illustrations of vaccine, person wearing a mask, hand washing, and social distancing Studies show that COVID-19 vaccines are effective at keeping you from getting COVID-19. Experts also think that getting a COVID-19 vaccine may help keep you from getting seriously ill even if you do get COVID-19.  COVID-19 vaccination is an important tool to help [place] get back to normal. Learn more about the benefits of getting vaccinated.  COVID-19 vaccines teach our immune systems how to recognize and fight the virus that causes COVID-19. It typically takes a few weeks after vaccination for the body to build protection (immunity) against the virus that causes COVID-19. That means it is possible a person could still get COVID-19 just after vaccination. This is because the vaccine has not had enough time to provide protection.  There are steps you can take to protect yourself until you can get vaccinated. Even after you get vaccinated it's important to continue using all the tools available to help stop this pandemic as we learn more about how COVID-19  vaccines work in real-world conditions. Even after vaccination, take steps to protect yourself and others from COVID-19.  What we do not know Protect Yourself and others from COVID-19 Wearing a mask over your nose and mouth Staying at least 6 feet away from others Avoiding crowds Avoiding poorly ventilated spaces Washing your hands often Although COVID-19 vaccines are effective at keeping you from getting sick, scientists are still learning how well vaccines prevent you from spreading the virus that causes COVID-19 to others, even if you do not get sick.  If you are vaccinated against COVID-19, you may still be exposed to the virus that causes COVID-19. After exposure, people can be infected with or "carry" the virus that causes COVID-19 but not feel sick or have any symptoms. Experts call this "asymptomatic infection."  For this reason, even after vaccination, we need to continue using all the tools available to help stop this pandemic as we learn more about how COVID-19 vaccines work in real-world conditions.  COVID-19 vaccines are safe Millions of people in the [place] have received COVID-19 vaccines, and these vaccines have undergone the most intensive safety monitoring in [place] history. This monitoring includes using both established and new safety monitoring systems to make sure that COVID-19 vaccines are safe. These vaccines cannot give you COVID-19. Learn more facts about COVID-19 vaccines.  CDC has developed a new tool, v-safe, to help [place] quickly find any safety issues with COVID-19 vaccines. V-safe is a Architectural technologist, Air traffic controller for people who receive COVID-19 vaccines. Learn how the federal government is working to ensure the safety of COVID-19 vaccines.  You may have side effects after vaccination, but these are normal After COVID-19 vaccination, you may have some side effects. These are normal signs that your body is building protection. The side  effects from COVID-19 vaccination, such as chills or tiredness, may affect your ability to do daily activities, and they should go away in a few days. Learn more about what to expect after getting vaccinated.  In the coming months, vaccines will become widely available How Do I Get a Vaccine? CDC makes recommendations for who should get the vaccine first, then each state makes its own plan. Choose your state or territory below to find your health department:  Although the vaccine supply is currently limited, the federal government is working toward making vaccines widely available for everyone at no cost. Learn more about how COVID-19 vaccines get to you and who should get vaccinated first when supplies are limited.  The federal government is providing the vaccine free of charge to people living in the [place]. However, your vaccination provider may bill your insurance company, [place], or Medicare for an administration fee. Vaccination providers can be reimbursed for this by the patient's public or private insurance company or, for uninsured patients, by the [employer] and [employer] Administration's Provider [employer]. No one can be denied a vaccine if they are unable to pay the vaccine administration fee.  In the coming months, doctors' offices, [employer], hospitals, and clinics will offer COVID-19 vaccine. Your doctor's office or local pharmacy may have contacted you with information about their vaccination plans. Use this tool to find more information on how to get vaccinated in your area:  COVID-19 vaccines and herd immunity What we know vaccine doctor family shield Herd immunity means that enough people in a community are protected from getting a disease because they've already had the disease or because they've been vaccinated. Herd immunity makes it hard for the disease to spread from person to person, and it even protects those who cannot be vaccinated, like newborns or  people who are allergic to the vaccine. The percentage of people who need to have protection to achieve herd immunity varies by disease.  What we do not know Experts do not yet know what percentage of people would need to get vaccinated to achieve herd immunity to COVID-19. CDC and other experts are studying herd immunity and will provide more information as it is available.  COVID-19 vaccines and new variants of the virus CDC is continuing to investigate the effectiveness of COVID-19 vaccines. Scientists also are working to learn about new variants of the virus. More studies are needed to understand how new variants may affect the effectiveness of existing COVID-19 vaccines.  Our knowledge of the characteristics of new variants is rapidly growing. CDC will share updates as soon as they are available. For more information, please visit [email or web address].

## 2019-06-29 ENCOUNTER — Telehealth: Payer: Self-pay | Admitting: Family Medicine

## 2019-06-29 NOTE — Telephone Encounter (Signed)
Patient called the after hours emergency line due to her blood glucose being 'high'.  She has had two readings today between 275-300.  She was discharged from the hospital yesterday for exacerbation of ILD. During that admission her blood glucose was elevated and required 5U lantus at night, but this was discontinued at discharge d/t improvement in blood glucose.  At home she takes 1055m metformin BID. Denied n/v.  Endorsed some diarrhea last night.  Otherwise no changes in symptoms since discharge.   Patient has a hospital f/u visit in 2/23 but I made an appt for sooner follow up on 2/19 so that her blood glucose can be evaluated.  She will likely need to go back on 5U of lantus.    Will forward to Dr. FPilar Plate who is on access to care tomorrow, as well as pcp, dr. MSandi Carne

## 2019-06-30 ENCOUNTER — Telehealth: Payer: Self-pay | Admitting: Family Medicine

## 2019-06-30 ENCOUNTER — Other Ambulatory Visit: Payer: Self-pay

## 2019-06-30 ENCOUNTER — Ambulatory Visit (INDEPENDENT_AMBULATORY_CARE_PROVIDER_SITE_OTHER): Payer: Medicaid Other | Admitting: Family Medicine

## 2019-06-30 VITALS — BP 136/70 | HR 94 | Wt 254.0 lb

## 2019-06-30 DIAGNOSIS — E119 Type 2 diabetes mellitus without complications: Secondary | ICD-10-CM | POA: Diagnosis not present

## 2019-06-30 DIAGNOSIS — J8410 Pulmonary fibrosis, unspecified: Secondary | ICD-10-CM | POA: Diagnosis not present

## 2019-06-30 MED ORDER — TRUE METRIX BLOOD GLUCOSE TEST VI STRP: ORAL_STRIP | 12 refills | Status: DC

## 2019-06-30 NOTE — Progress Notes (Signed)
CHIEF COMPLAINT / HPI:  Hospital F/U Patient admitted for acute exacerbation of idiopathic pulmonary fibrosis.  Treated for exacerbation with steroids and azithromycin.  Finished pred today and finished azithro yesterday. Inpatient from 2/15-2/17.  She states that she is feeling the same as when she went to the hospital when she exerts herself, but feels better at rest.  No fevers.  On 2L per [place], which is her baseline.  T2DM Called after hours emergency line on 2/18 reporting elevated CBGs in 275-300 range.  CBGs 150 this AM and finished pred today.  No polyuria, polydipsia, nausea, vomited.  Also asking for chest strip refill.  PERTINENT  PMH / PSH: Idiopathic pulmonary fibrosis, primary biliary cholangitis, COPD, T2DM, cirrhosis with esophageal varices, hypothyroidism, chronic pancytopenia   OBJECTIVE: BP 136/70   Pulse 94   Wt 254 lb (115.2 kg)   LMP  (LMP Unknown)   SpO2 95%   BMI 41.00 kg/m    Physical Exam:  General: 54 y.o. female in NAD, sitting in wheelchair Cardio: RRR no m/r/g Lungs: Clear to auscultation in upper lung fields, good air movement throughout all lung fields, dry crackles at bilateral bases, on 2 L per [place], mildly increased effort with deep breathing Skin: warm and dry Extremities: No edema   ASSESSMENT / PLAN:  Interstitial pulmonary fibrosis (HCC) Patient has very severe disease process and follows with pulmonology.  She has spoken with palliative care in the past, but is not interested in hospice care at this time.  She was admitted for recent exacerbation of her chronic underlying fibrosis.  Completed therapy today.  She seems to be stable.  Her breathing is comfortable at rest.  She still gets short of breath with exertion.  She has been without fever and overall she has good air movement.  At this time, would wait on extending any further therapy.  It is likely that her chronic disease process is worsening.  Advised her of return precautions including  worsening of shortness of breath and chest pain over the weekend.  She has an appointment on 2/23 with Dr. Maudie Mercury in access to care.  Advised her to keep this appointment and to return to care.  If she is not having any improvement that time, could recommend starting further treatment for acute exacerbation of pulmonary fibrosis.  Also advised her to call her pulmonologist to schedule an appointment.  Discussed with her at length the proper inhalers that she should be taking.  Disposed of Dulera and ipratropium as not on med list.  She should be taking Spiriva, Symbicort twice daily, albuterol as needed.  Type 2 diabetes mellitus without complication, without long-term current use of insulin (HCC) CBGs were elevated with prednisone.  She denies any polyuria, polydipsia, nausea, vomiting.  Now that she has stopped prednisone, her CBGs will improve.  Advised her of this.  Given that her CBG was 150 this a.m. that she took her last dose today, would not start her on any further therapy at this time.  Advised her if her CBGs remain elevated in the next few days, to call or come back.  She has an appointment scheduled with me on 3/10 for follow-up.     Cleophas Dunker, [employer]

## 2019-06-30 NOTE — Telephone Encounter (Signed)
**  After Hours/ Emergency Line Call**  Received a call to report that Holly Mueller noticed an elevated heart rate when walking to her kitchen earlier today.  She felt her heart racing and put on her pulse ox.  Her oxygen saturation was 92% and her heart rate was 167 which then subsequently came down to 80s with rest.  Patient still has shortness of breath with exertion from her interstitial pulmonary fibrosis but reports this shortness of breath has not worsened.  She does note she has to titrate her oxygen with exertion with max up to 3 L today.  Denied chest pain, nausea, vomiting during episode of tachycardia.  Was recently seen for hospital follow-up at [facility] today by PCP.  Has completed steroids today.  Explained to patient that increase in heart rate was likely due to increased oxygen demand during exertion and progressively worsened chronic pulmonary disease process.  Given her heart rate has normalized with rest and patient denies symptoms concerning for cardiac ischemia or increased demand, reasonable to continue to manage in the outpatient setting.  Patient has follow-up appointment on 2/23 with Dr. Maudie Mercury, advised patient to keep this appointment.  Also recommended patient make appointment with primary pulmonologist for follow-up, patient states she is awaiting a return call from them.  Discussed red flags and reasons to present for emergent care such as persistently elevated heart rate associated with chest pain, nausea, vomiting or worsened shortness of breath at rest.  Patient verbalized understanding. Will forward to PCP.  Rory Percy, DO PGY-3, [facility] 06/30/2019 10:41 PM

## 2019-06-30 NOTE — Assessment & Plan Note (Signed)
CBGs were elevated with prednisone.  She denies any polyuria, polydipsia, nausea, vomiting.  Now that she has stopped prednisone, her CBGs will improve.  Advised her of this.  Given that her CBG was 150 this a.m. that she took her last dose today, would not start her on any further therapy at this time.  Advised her if her CBGs remain elevated in the next few days, to call or come back.  She has an appointment scheduled with me on 3/10 for follow-up.

## 2019-06-30 NOTE — Telephone Encounter (Signed)
This pt seems to have fallen off my schedule. Please call pt and schedule an appointment for later today or Monday.    Best, Matilde Haymaker, MD

## 2019-06-30 NOTE — Assessment & Plan Note (Signed)
Patient has very severe disease process and follows with pulmonology.  She has spoken with palliative care in the past, but is not interested in hospice care at this time.  She was admitted for recent exacerbation of her chronic underlying fibrosis.  Completed therapy today.  She seems to be stable.  Her breathing is comfortable at rest.  She still gets short of breath with exertion.  She has been without fever and overall she has good air movement.  At this time, would wait on extending any further therapy.  It is likely that her chronic disease process is worsening.  Advised her of return precautions including worsening of shortness of breath and chest pain over the weekend.  She has an appointment on 2/23 with Dr. Maudie Mercury in access to care.  Advised her to keep this appointment and to return to care.  If she is not having any improvement that time, could recommend starting further treatment for acute exacerbation of pulmonary fibrosis.  Also advised her to call her pulmonologist to schedule an appointment.  Discussed with her at length the proper inhalers that she should be taking.  Disposed of Dulera and ipratropium as not on med list.  She should be taking Spiriva, Symbicort twice daily, albuterol as needed.

## 2019-06-30 NOTE — Patient Instructions (Signed)
Thank you for coming to see me today. It was a pleasure. Today we talked about:   Your inhalers:   Every day, twice a day, use Symbicort Every day once a day, use Spiriva (the pill one) Use albuterol only as needed  Do not use Dulera or Atrovent  Keep checking your sugars, they should go down, now that you are off prednisone  Call you lung doctor to schedule an appointment.  If you have worsening shortness of breath or chest pain, go to the Emergency Room.   Please follow-up with Dr. Maudie Mercury in 2/23.  If you have any questions or concerns, please do not hesitate to call the office at [phone].  Best,   Holly Constable, DO

## 2019-06-30 NOTE — Telephone Encounter (Signed)
She is scheduled for ATC this afternoon.

## 2019-07-03 ENCOUNTER — Telehealth: Payer: Self-pay | Admitting: *Deleted

## 2019-07-03 NOTE — Telephone Encounter (Signed)
LVM to call office to go over screening questions prior to visit.Zyanya Glaza Zimmerman Rumple, CMA

## 2019-07-04 ENCOUNTER — Ambulatory Visit: Payer: Medicaid Other | Admitting: Internal Medicine

## 2019-07-04 ENCOUNTER — Other Ambulatory Visit: Payer: Self-pay

## 2019-07-04 ENCOUNTER — Telehealth: Payer: Self-pay | Admitting: *Deleted

## 2019-07-04 ENCOUNTER — Ambulatory Visit (INDEPENDENT_AMBULATORY_CARE_PROVIDER_SITE_OTHER): Payer: Medicaid Other | Admitting: Family Medicine

## 2019-07-04 VITALS — BP 110/61 | HR 92 | Temp 98.0°F | Wt 240.0 lb

## 2019-07-04 DIAGNOSIS — R0609 Other forms of dyspnea: Secondary | ICD-10-CM

## 2019-07-04 DIAGNOSIS — J8410 Pulmonary fibrosis, unspecified: Secondary | ICD-10-CM | POA: Diagnosis not present

## 2019-07-04 DIAGNOSIS — J849 Interstitial pulmonary disease, unspecified: Secondary | ICD-10-CM

## 2019-07-04 DIAGNOSIS — R0600 Dyspnea, unspecified: Secondary | ICD-10-CM

## 2019-07-04 DIAGNOSIS — E890 Postprocedural hypothyroidism: Secondary | ICD-10-CM

## 2019-07-04 DIAGNOSIS — R000 Tachycardia, unspecified: Secondary | ICD-10-CM | POA: Diagnosis not present

## 2019-07-04 NOTE — Patient Instructions (Signed)
Dear Holly Mueller,   It was good to see you! Thank you for taking your time to come in to be seen. Today, we discussed the following:   Follow-up   We discussed reasons to go to the ED  We are checking your TSH today as it was very low on the last test and we may need to make changes in your synthroid - we will call you with updates.   Continue to follow with Chronic Care management and pulmonary    Be well,   Zettie Cooley, M.D   [facility] [phone]  *Sign up for MyChart for instant access to your health profile, labs, orders, upcoming appointments or to contact your provider with questions*  ===================================================================================

## 2019-07-04 NOTE — Telephone Encounter (Signed)
[facility] outpatient pharmacy called and needed clarification on glucometer instructions.  Since pt is not on insulin insurance will only cover once daily testing.  Verbal given for once daily testing.    Will forward to PCP for FYI and change if neccessary.  Christen Bame, CMA

## 2019-07-05 ENCOUNTER — Encounter: Payer: Self-pay | Admitting: Internal Medicine

## 2019-07-05 ENCOUNTER — Ambulatory Visit (INDEPENDENT_AMBULATORY_CARE_PROVIDER_SITE_OTHER): Payer: Medicaid Other | Admitting: Internal Medicine

## 2019-07-05 VITALS — BP 104/60 | HR 89 | Temp 97.5°F | Ht 66.0 in | Wt 238.1 lb

## 2019-07-05 DIAGNOSIS — K602 Anal fissure, unspecified: Secondary | ICD-10-CM

## 2019-07-05 DIAGNOSIS — K625 Hemorrhage of anus and rectum: Secondary | ICD-10-CM | POA: Diagnosis not present

## 2019-07-05 DIAGNOSIS — D61818 Other pancytopenia: Secondary | ICD-10-CM

## 2019-07-05 DIAGNOSIS — K743 Primary biliary cirrhosis: Secondary | ICD-10-CM | POA: Diagnosis not present

## 2019-07-05 LAB — TSH: TSH: 1.17 u[IU]/mL (ref 0.450–4.500)

## 2019-07-05 NOTE — Progress Notes (Addendum)
    SUBJECTIVE:   CHIEF COMPLAINT / HPI:   ILD follow up  Patient presents for follow up after appt with Dr. Sandi Carne on 06/30/19 for check in. Patient reported having DOE at that time. Today, patient reports that she is able to walk to her kitchen (which is about 15 feet), but often does not want to go to the living room with her family as it is just too much work to get out of bed. She also reports two episodes of tachycardia since d/c from the hospital. The first time HR was 167, for less than ten seconds. The second time wasn't as high. She reports desatting to 79% this morning after showering and getting dressed while sitting on bed. This improved to 90's within a minute. Patient reports being home alone for long periods of time as her brother and sister (with whom she lives) go to work.    Her Goal is to breathe better and be able to walk around better. She reports understanding of her diagnosis and becomes tearful when we discuss the prognosis given by pulmonology.  She reports compliance with her medicaitons: spiriva, albuterol and symbicort   PERTINENT  PMH / PSH: ILD, hypothyroidism   OBJECTIVE:   BP 110/61   Pulse 92   Temp 98 F (36.7 C) (Oral)   Wt 240 lb (108.9 kg)   LMP  (LMP Unknown)   SpO2 96% Comment: 2L O2  BMI 38.74 kg/m   General: female with nasal cannula, becomes breathless with longer sentences and has to take breaks  Lungs: Good airmovement in upper lung fields, crackling throughout both mid-lower lung fields. No wheezing appreciated.  Hands: clubbed fingernails   ASSESSMENT/PLAN:   Tachycardia TSH today as last lab visit last year and showed that she is possibly taking too much medication. She could be leading to her tachycardia and may be worsening her symptoms.  Patient given return precautions for symptomatic tachycardia or worsened breathing.  TSH wnl. No changes in medications at this time.   Interstitial pulmonary fibrosis ([place]) Patient returns  for follow up today and has been stable. VSS and no significant changes in physical exam. She continues on 2 L and has only had to increase it temporarily a few times. Spoke today about prognosis and patient reports that she is not ready to give up. We discussed possible options at this time, which are very limited, but include seeking second opinion or starting to talk with palliative care. We discussed importance of mental health during this difficult time. Patient declines mental health services at this time, but is encouraged to return if she changes her mind. PHQ 2 is 2 today.   No changes in medication management today.      Wilber Oliphant, MD [place]

## 2019-07-05 NOTE — Progress Notes (Signed)
Holly Mueller 54 y.o. 04-02-1966 [id number]  Assessment & Plan:   Encounter Diagnoses  Name Primary?  . 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 Anterior anal fissure   . Rectal bleeding   . Pancytopenia (HCC)     Continue ursodiol.  She has had a nice biochemical response to that.  Main issue is her pulmonary fibrosis which will be life limiting unfortunately I suspect.  She had an anterior anal fissure I think it looks like it is healing and I suspect that is what caused the bleeding.  She is not a candidate for a colonoscopy for a preventive procedure given her comorbidities and I do not think she needs one based upon the rectal bleeding and the fissure that I see.  I will see her back in 6 months sooner as needed CC: Meccariello, Bernita Raisin, DO   Subjective:   Chief Complaint: Cirrhosis from [place] recent rectal bleeding  HPI Holly Mueller is here for follow-up, she was hospitalized in February with respiratory problems/COPD pulmonary fibrosis flare.  She is improving from that.  She remains on chronic O2.  She had been constipated and straining prior to that admission and somewhat during it and had some blood on the tissue paper.  Her anus was tender.  She was seen by her primary care provider in the family medicine clinic and was diagnosed with hemorrhoids but it was noted that the anoscopic exam was terminated due to pain.  Since that time she has improved and has not had any bleeding in a week or so.  Bowel movements are much more regular and easy especially since she got home from the hospital.  She is waiting to see Dr. Chase Caller regarding her pulmonary fibrosis later in March.  He is considering investigational therapies.  She had her gastric varix obliterated again by Dr. Rush Landmark in the fall.  She needed an additional cyanoacrylate injection. Allergies  Allergen Reactions  . Aspirin Nausea Only and Other (See Comments)    Upset stomach   Current  Meds  Medication Sig  . Acetaminophen (APAP) 325 MG tablet Take 2 tablets (650 mg total) by mouth every 6 (six) hours as needed.  Marland Kitchen albuterol (PROAIR HFA) 108 (90 Base) MCG/ACT inhaler INHALE 2 PUFFS INTO THE LUNGS EVERY 6 HOURS AS NEEDED FOR SHORTNESS OF BREATH (Patient taking differently: Inhale 2 puffs into the lungs every 4 (four) hours as needed for wheezing or shortness of breath. )  . albuterol (PROVENTIL) (2.5 MG/3ML) 0.083% nebulizer solution Take 3 mLs (2.5 mg total) by nebulization every 6 (six) hours as needed for wheezing or shortness of breath.  . blood glucose meter kit and supplies Dispense based on patient and insurance preference. Use up to four times daily as directed. (FOR ICD-10 E10.9, E11.9).  . budesonide-formoterol (SYMBICORT) 80-4.5 MCG/ACT inhaler Inhale 2 puffs into the lungs 2 (two) times daily for 1 day.  . cetirizine (ZYRTEC) 10 MG tablet Take 1 tablet (10 mg total) by mouth daily.  Marland Kitchen dextromethorphan-guaiFENesin (MUCINEX DM) 30-600 MG 12hr tablet Take 1-2 tablets by mouth at bedtime as needed for cough.  . diclofenac Sodium (VOLTAREN) 1 % GEL Apply 4 g topically 4 (four) times daily as needed (pain).  . fluticasone (FLONASE) 50 MCG/ACT nasal spray Place 2 sprays into both nostrils in the morning and at bedtime.   Marland Kitchen glucose blood (TRUE METRIX BLOOD GLUCOSE TEST) test strip Use as instructed  . levothyroxine (SYNTHROID) 125 MCG tablet Take  1 tablet (125 mcg total) by mouth 2 (two) times daily.  . metFORMIN (GLUCOPHAGE) 500 MG tablet Take 1 tablet (500 mg total) by mouth 2 (two) times daily with a meal.  . OXYGEN Inhale 2 L into the lungs continuous.   . pantoprazole (PROTONIX) 40 MG tablet Take 1 tablet (40 mg total) by mouth 2 (two) times daily before a meal.  . SPIRIVA HANDIHALER 18 MCG inhalation capsule INHALE 1 CAPSULE VIA HANDIHALER ONCE DAILY AT THE SAME TIME EVERY DAY  . TRUEplus Lancets 28G MISC Use to check blood sugar as directed.   Past Medical History:    Diagnosis Date  . Acute respiratory failure with hypoxia ([place]) 09/22/2018  . Anxiety    "anxiety attacks- sometimes"  . Arthritis    bursitis left hip flares-not an issue  . Chronic kidney disease   . Community acquired pneumonia 11/28/2018  . Diabetes mellitus without complication ([place])    Type II  . Dysrhythmia   . Edentulous    10-19-13 at present  . Epilepsy ([place]) in 1972   No seizures since 1972. Previously treated with phenobarbital.   . Gastric varices with bleeding   . GERD (gastroesophageal reflux disease) 2008  . 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 Headache(784.0)    hx of migraines   . Heart murmur   . 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 History of blood transfusion    gi bleed  . History of kidney stones    passed  . Hypothyroidism 2008  . Interstitial lung disease ([place])   . Leukopenia   . Lower esophageal ring 08/18/2013  . Pneumonia   . Primary biliary cholangitis ([place]) 10/24/2018  . Seasonal allergies 2003  . SEIZURES, HX OF 12/12/2009   Annotation: last seizure in early 1970s Qualifier: Diagnosis of  By: Carlena Sax  MD, Colletta Maryland    . Shortness of breath    walking  . Wears glasses    Past Surgical History:  Procedure Laterality Date  . ABDOMINAL HYSTERECTOMY     " partial "  . BALLOON DILATION N/A 10/24/2013   Procedure: BALLOON DILATION;  Surgeon: Gatha Mayer, MD;  Location: [facility];  Service: Endoscopy;  Laterality: N/A;  . BIOPSY  09/26/2018   Procedure: BIOPSY;  Surgeon: Thornton Park, MD;  Location: [place];  Service: Gastroenterology;;  . BIOPSY  01/09/2019   Procedure: BIOPSY;  Surgeon: Irving Copas., MD;  Location: [place];  Service: Gastroenterology;;  . CESAREAN SECTION     x2  . DENTAL SURGERY     multiple extractions 3'15  . DILATION AND CURETTAGE OF UTERUS    . ESOPHAGOGASTRODUODENOSCOPY N/A 10/24/2013   Procedure:  ESOPHAGOGASTRODUODENOSCOPY (EGD);  Surgeon: Gatha Mayer, MD;  Location: [facility];  Service: Endoscopy;  Laterality: N/A;  . ESOPHAGOGASTRODUODENOSCOPY N/A 01/09/2019   Procedure: ESOPHAGOGASTRODUODENOSCOPY (EGD);  Surgeon: Irving Copas., MD;  Location: [place];  Service: Gastroenterology;  Laterality: N/A;  . ESOPHAGOGASTRODUODENOSCOPY (EGD) WITH PROPOFOL N/A 09/26/2018   Procedure: ESOPHAGOGASTRODUODENOSCOPY (EGD) WITH PROPOFOL;  Surgeon: Thornton Park, MD;  Location: [place];  Service: Gastroenterology;  Laterality: N/A;  . ESOPHAGOGASTRODUODENOSCOPY (EGD) WITH PROPOFOL N/A 04/26/2019   Procedure: ESOPHAGOGASTRODUODENOSCOPY (EGD) WITH PROPOFOL;  Surgeon: Rush Landmark Telford Nab., MD;  Location: [place];  Service: Gastroenterology;  Laterality: N/A;  . HEMOSTASIS CLIP PLACEMENT  04/26/2019   Procedure: HEMOSTASIS CLIP PLACEMENT;  Surgeon: Justice Britain  Brooke Bonito., MD;  Location: [place];  Service: Gastroenterology;;  . Everlean Alstrom PARACENTESIS  09/23/2018  . LIVER BIOPSY  06/30/2012   Procedure: LIVER BIOPSY;  Surgeon: Shann Medal, MD;  [facility];  Service: General;;  . NOVASURE ABLATION    . SCLEROTHERAPY  04/26/2019   Procedure: Clide Deutscher;  Surgeon: Mansouraty, Telford Nab., MD;  Location: [place];  Service: Gastroenterology;;  cyanoacrylate  . SHOULDER ARTHROSCOPY Right 2011  . UMBILICAL HERNIA REPAIR N/A 06/30/2012   Procedure: remove umbilicus;  Surgeon: Shann Medal, MD;  [facility];  Service: General;  Laterality: N/A;  . UPPER ESOPHAGEAL ENDOSCOPIC ULTRASOUND (EUS) N/A 01/09/2019   Procedure: UPPER ESOPHAGEAL ENDOSCOPIC ULTRASOUND (EUS);  Surgeon: Irving Copas., MD;  Location: [place];  Service: Gastroenterology;  Laterality: N/A;  . VENTRAL HERNIA REPAIR N/A 06/30/2012   Procedure: LAPAROSCOPIC VENTRAL HERNIA;  Surgeon: Shann Medal, MD;  [facility];  Service: General;  Laterality: N/A;  With Mesh  . VIDEO  BRONCHOSCOPY Bilateral 07/20/2018   Procedure: VIDEO BRONCHOSCOPY WITHOUT FLUORO;  Surgeon: Brand Males, MD;  [facility];  Service: Cardiopulmonary;  Laterality: Bilateral;  . 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   Review of Systems As per HPI  Objective:   Physical Exam BP 104/60 (BP Location: Left Arm, Patient Position: Sitting, Cuff Size: Normal)   Pulse 89   Temp (!) 97.5 F (36.4 C)   Ht _0  (1.676 m)   Wt 238 lb 2 oz (108 kg)   LMP  (LMP Unknown)   SpO2 94%   BMI 38.43 kg/m  Chronically ill on oxygen dyspnea Dyspneic Eyes anicteric Lungs clear with fair to good air movement actually no crackles Heart sounds normal Abdomen obese  Rectal exam with Patti Martinique, CMA present nontender no mass.  Brown stool firm.  There is a small anterior sentinel pile  Anoscopic exam performed.  There is a healing anterior anal fissure and some slightly inflamed external hemorrhoids.  No significant internal hemorrhoids or rectal varices appreciated

## 2019-07-05 NOTE — Patient Instructions (Signed)
Please follow up with Dr Carlean Purl in six months or sooner if needed.   I appreciate the opportunity to care for you. Silvano Rusk, MD, [facility]

## 2019-07-06 DIAGNOSIS — R000 Tachycardia, unspecified: Secondary | ICD-10-CM | POA: Insufficient documentation

## 2019-07-06 NOTE — Assessment & Plan Note (Addendum)
Patient returns for follow up today and has been stable. VSS and no significant changes in physical exam. She continues on 2 L and has only had to increase it temporarily a few times. Spoke today about prognosis and patient reports that she is not ready to give up. We discussed possible options at this time, which are very limited, but include seeking second opinion or starting to talk with palliative care. We discussed importance of mental health during this difficult time. Patient declines mental health services at this time, but is encouraged to return if she changes her mind. PHQ 2 is 2 today.   No changes in medication management today.

## 2019-07-06 NOTE — Assessment & Plan Note (Signed)
TSH today as last lab visit last year and showed that she is possibly taking too much medication. She could be leading to her tachycardia and may be worsening her symptoms.  Patient given return precautions for symptomatic tachycardia or worsened breathing.  TSH wnl. No changes in medications at this time.

## 2019-07-09 ENCOUNTER — Telehealth: Payer: Self-pay | Admitting: Gastroenterology

## 2019-07-09 NOTE — Telephone Encounter (Signed)
Paged on-call GI. Worsening dyschezia over the last few days, with intermittent scant BRB. Has been treating with OTC Preparation H cream. Difficult having BM due to pain- started laxative. No fever, chills, n/v.   Was seen by Dr. Carlean Purl on 07/05/19 and diagnosed with small, healing anal fissure. By her description, sounds c/w recurrence of anal fissure, and will treat as below:  - Stop Preparation H - Start OTC Recticare - PJ, can you please assist in the AM with sending in Rx for topical NTG 0.125%. Directions: apply a small, pea-sized amount to the affected area BID for 6-8 weeks.  - Sitz bath with warm water for 10-15 minutes 2-3 times daily; directed to [employer] available online or at [employer]; ensure to dry area afterwards   - We discussed the ADR of headache, and if patient does experience, to call the office and will change and make pharmacy request to compound topical CCB (topical nifedipine 0.2-0.3% applied 2-4 times daily;     All questions answered and appreciative of callback and advice.

## 2019-07-10 ENCOUNTER — Telehealth: Payer: Self-pay | Admitting: Internal Medicine

## 2019-07-10 ENCOUNTER — Other Ambulatory Visit: Payer: Self-pay

## 2019-07-10 ENCOUNTER — Ambulatory Visit: Payer: Medicaid Other

## 2019-07-10 MED ORDER — AMBULATORY NON FORMULARY MEDICATION: 1 refills | Status: AC

## 2019-07-10 NOTE — Telephone Encounter (Signed)
Left message for her to call me back.

## 2019-07-10 NOTE — Telephone Encounter (Signed)
I have left [name] a message to call me back to confirm the pharmacy for her NTG to be sent.

## 2019-07-10 NOTE — Telephone Encounter (Signed)
I spoke with Holly Mueller and she is aware that the NTG rx is being sent to [facility]. She will call them before she goes to get it.

## 2019-07-10 NOTE — Telephone Encounter (Signed)
Pt has questions regarding nitroglycerin ointment.

## 2019-07-10 NOTE — Telephone Encounter (Signed)
Questions answered about her medicine.

## 2019-07-10 NOTE — Addendum Note (Signed)
Addended by: Martinique, Covey Baller E on: 07/10/2019 11:23 AM   Modules accepted: Orders

## 2019-07-10 NOTE — Chronic Care Management (AMB) (Signed)
  Care Management   Outreach Note  07/10/2019 Name: Holly Mueller MRN: [id number] DOB: 1965/12/14  Referred by: Cleophas Dunker, DO Reason for referral : Care Coordination (Care Management RNCM GI Appointment)   A second unsuccessful telephone outreach was attempted today. The patient was referred to the case management team for assistance with care management and care coordination.   Follow Up Plan: A HIPPA compliant phone message was left for the patient providing contact information and requesting a return call.  The care management team will reach out to the patient again over the next 14 days.   Lazaro Arms RN, BSN, [facility] Care Management Coordinator [place] Phone: [phone] Fax: [phone]

## 2019-07-11 DIAGNOSIS — Z7189 Other specified counseling: Secondary | ICD-10-CM

## 2019-07-11 DIAGNOSIS — Z515 Encounter for palliative care: Secondary | ICD-10-CM

## 2019-07-13 ENCOUNTER — Other Ambulatory Visit: Payer: Self-pay

## 2019-07-13 ENCOUNTER — Emergency Department (HOSPITAL_COMMUNITY): Payer: Medicaid Other

## 2019-07-13 ENCOUNTER — Ambulatory Visit: Payer: Medicaid Other | Admitting: Internal Medicine

## 2019-07-13 ENCOUNTER — Emergency Department (HOSPITAL_COMMUNITY)
Admission: EM | Admit: 2019-07-13 | Discharge: 2019-07-14 | Disposition: A | Discharge status: Home / Self Care | Payer: Medicaid Other | Attending: Emergency Medicine | Admitting: Emergency Medicine

## 2019-07-13 ENCOUNTER — Telehealth: Payer: Self-pay | Admitting: Internal Medicine

## 2019-07-13 DIAGNOSIS — E1122 Type 2 diabetes mellitus with diabetic chronic kidney disease: Secondary | ICD-10-CM | POA: Diagnosis not present

## 2019-07-13 DIAGNOSIS — R05 Cough: Secondary | ICD-10-CM | POA: Diagnosis not present

## 2019-07-13 DIAGNOSIS — Z79899 Other long term (current) drug therapy: Secondary | ICD-10-CM | POA: Insufficient documentation

## 2019-07-13 DIAGNOSIS — J849 Interstitial pulmonary disease, unspecified: Secondary | ICD-10-CM | POA: Insufficient documentation

## 2019-07-13 DIAGNOSIS — N189 Chronic kidney disease, unspecified: Secondary | ICD-10-CM | POA: Diagnosis not present

## 2019-07-13 DIAGNOSIS — R062 Wheezing: Secondary | ICD-10-CM | POA: Insufficient documentation

## 2019-07-13 DIAGNOSIS — R0602 Shortness of breath: Secondary | ICD-10-CM | POA: Diagnosis present

## 2019-07-13 DIAGNOSIS — Z7984 Long term (current) use of oral hypoglycemic drugs: Secondary | ICD-10-CM | POA: Diagnosis not present

## 2019-07-13 DIAGNOSIS — Z20822 Contact with and (suspected) exposure to covid-19: Secondary | ICD-10-CM | POA: Insufficient documentation

## 2019-07-13 DIAGNOSIS — E039 Hypothyroidism, unspecified: Secondary | ICD-10-CM | POA: Insufficient documentation

## 2019-07-13 LAB — CBC WITH DIFFERENTIAL/PLATELET
Abs Immature Granulocytes: 0.02 10*3/uL (ref 0.00–0.07)
Basophils Absolute: 0 10*3/uL (ref 0.0–0.1)
Basophils Relative: 2 %
Eosinophils Absolute: 0.1 10*3/uL (ref 0.0–0.5)
Eosinophils Relative: 9 %
HCT: 29.3 % — ABNORMAL LOW (ref 36.0–46.0)
Hemoglobin: 9 g/dL — ABNORMAL LOW (ref 12.0–15.0)
Immature Granulocytes: 2 %
Lymphocytes Relative: 22 %
Lymphs Abs: 0.2 10*3/uL — ABNORMAL LOW (ref 0.7–4.0)
MCH: 25.6 pg — ABNORMAL LOW (ref 26.0–34.0)
MCHC: 30.7 g/dL (ref 30.0–36.0)
MCV: 83.2 fL (ref 80.0–100.0)
Monocytes Absolute: 0.2 10*3/uL (ref 0.1–1.0)
Monocytes Relative: 14 %
Neutro Abs: 0.6 10*3/uL — ABNORMAL LOW (ref 1.7–7.7)
Neutrophils Relative %: 51 %
Platelets: 51 10*3/uL — ABNORMAL LOW (ref 150–400)
RBC: 3.52 MIL/uL — ABNORMAL LOW (ref 3.87–5.11)
RDW: 15.5 % (ref 11.5–15.5)
WBC: 1.1 10*3/uL — CL (ref 4.0–10.5)
nRBC: 0 % (ref 0.0–0.2)

## 2019-07-13 LAB — COMPREHENSIVE METABOLIC PANEL
ALT: 32 U/L (ref 0–44)
AST: 41 U/L (ref 15–41)
Albumin: 2.6 g/dL — ABNORMAL LOW (ref 3.5–5.0)
Alkaline Phosphatase: 167 U/L — ABNORMAL HIGH (ref 38–126)
Anion gap: 6 (ref 5–15)
BUN: 10 mg/dL (ref 6–20)
CO2: 21 mmol/L — ABNORMAL LOW (ref 22–32)
Calcium: 8.8 mg/dL — ABNORMAL LOW (ref 8.9–10.3)
Chloride: 112 mmol/L — ABNORMAL HIGH (ref 98–111)
Creatinine, Ser: 0.59 mg/dL (ref 0.44–1.00)
GFR calc Af Amer: >60 mL/min (ref >60)
GFR calc non Af Amer: >60 mL/min (ref >60)
Glucose, Bld: 169 mg/dL — ABNORMAL HIGH (ref 70–99)
Potassium: 3.9 mmol/L (ref 3.5–5.1)
Sodium: 139 mmol/L (ref 135–145)
Total Bilirubin: 1.7 mg/dL — ABNORMAL HIGH (ref 0.3–1.2)
Total Protein: 6.9 g/dL (ref 6.5–8.1)

## 2019-07-13 LAB — POC SARS CORONAVIRUS 2 AG -  ED: SARS Coronavirus 2 Ag: NEGATIVE

## 2019-07-13 MED ORDER — ALBUTEROL SULFATE HFA 108 (90 BASE) MCG/ACT IN AERS
8.0000 | INHALATION_SPRAY | Freq: Once | RESPIRATORY_TRACT | Status: AC
  Administered 2019-07-13: 8 via RESPIRATORY_TRACT
  Filled 2019-07-13: qty 6.7

## 2019-07-13 MED ORDER — MAGNESIUM SULFATE 2 GM/50ML IV SOLN
2.0000 g | INTRAVENOUS | Status: AC
  Administered 2019-07-13: 2 g via INTRAVENOUS
  Filled 2019-07-13: qty 50

## 2019-07-13 MED ORDER — METHYLPREDNISOLONE SODIUM SUCC 125 MG IJ SOLR
125.0000 mg | Freq: Once | INTRAMUSCULAR | Status: AC
  Administered 2019-07-13: 125 mg via INTRAVENOUS
  Filled 2019-07-13: qty 2

## 2019-07-13 NOTE — ED Triage Notes (Signed)
Pt here for ongoing shob, generalized body aches, and cough, unchanged since last visit here on 2/15. Pt advised by her pulmonary doctor to come here. Sts her sputum is sometimes blood-tinged.

## 2019-07-13 NOTE — ED Provider Notes (Signed)
[facility] EMERGENCY DEPARTMENT Provider Note   CSN: [id number] Arrival date & time: 07/13/19  1457     History Chief Complaint  Patient presents with  . Shortness of Breath  . Generalized Body Aches  . Cough    Holly Mueller is a 54 y.o. female.  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  Patient was sent in by her pulmonologist due to difficulty breathing, and labored breathing over telehealth visit.  She has had 3 to 4 days of worsening shortness of breath unrelieved by her home neb treatments.  She complains of wheezing and tightness.  Patient requiring 3 L of oxygen to remain above 90%.  She denies fevers or chills.  She is having cough and wheezing.  She denies body aches, exposure to Covid  HPI     Past Medical History:  Diagnosis Date  . Acute respiratory failure with hypoxia ([place]) 09/22/2018  . Anxiety    "anxiety attacks- sometimes"  . Arthritis    bursitis left hip flares-not an issue  . Chronic kidney disease   . Community acquired pneumonia 11/28/2018  . Diabetes mellitus without complication ([place])    Type II  . Dysrhythmia   . Edentulous    10-19-13 at present  . Epilepsy ([place]) in 1972   No seizures since 1972. Previously treated with phenobarbital.   . Gastric varices with bleeding   . GERD (gastroesophageal reflux disease) 2008  . 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 Headache(784.0)    hx of migraines   . Heart murmur   . 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 ([place])   . History of blood transfusion    gi bleed  . History of kidney stones      passed  . Hypothyroidism 2008  . Interstitial lung disease ([place])   . Leukopenia   . Lower esophageal ring 08/18/2013  . Pneumonia   . Primary biliary cholangitis ([place]) 10/24/2018  . Seasonal allergies 2003  . SEIZURES, HX OF 12/12/2009   Annotation: last seizure in early 1970s Qualifier: Diagnosis of  By: Carlena Sax  MD, Colletta Maryland    . Shortness of breath    walking  . Wears glasses     Patient Active Problem List   Diagnosis Date Noted  . Goals of care, counseling/discussion   . Palliative care by specialist   . Advanced care planning/counseling discussion   . Tachycardia 07/06/2019  . COPD exacerbation ([place]) 06/27/2019  . Interstitial pulmonary fibrosis ([place]) 06/26/2019  . Acute on chronic respiratory failure with hypoxemia ([place]) 06/26/2019  . It band syndrome, left 06/16/2019  . Rectal bleeding 06/16/2019  . On supplemental oxygen therapy   . Nodule of right lung   . Respiratory distress   . Community acquired pneumonia of left lower lobe of lung 05/22/2019  . Petechiae 05/01/2019  . Lower extremity edema 05/01/2019  . Irritant contact dermatitis due to detergent 03/31/2019  . Melena 03/31/2019  . Financial difficulties 03/31/2019  . Skin rash 03/17/2019  . Intertrigo 02/11/2019  . Dysuria 02/11/2019  . Chronic respiratory failure with hypoxia ([place]) 01/11/2019  . Inadequate housing 12/05/2018  . Primary biliary cholangitis (  [place]) 10/24/2018  . Gastric varices with bleeding   . Anemia 09/22/2018  . Pancytopenia ([place]) 08/28/2018  . Does not have health insurance 04/04/2018  . ILD (interstitial lung disease) ([place]) 03/23/2018  . Type 2 diabetes mellitus without complication, without long-term current use of insulin ([place])   . 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 ([place]) 11/17/2016  . GERD (gastroesophageal reflux disease) 10/23/2016  . Chronic cough 12/09/2015  . Precordial chest pain 08/03/2015  . DOE (dyspnea on exertion) 08/17/2013  . Ganglion cyst of wrist  08/24/2012  . Right hip pain 08/23/2012  . Complex care coordination 02/14/2012  . Obesity 07/08/2010  . Shortness of breath 12/12/2009  . Depression 07/26/2008  . Hypothyroidism 10/12/2006  . Restrictive lung disease 07/30/2006    Past Surgical History:  Procedure Laterality Date  . ABDOMINAL HYSTERECTOMY     " partial "  . BALLOON DILATION N/A 10/24/2013   Procedure: BALLOON DILATION;  Surgeon: Gatha Mayer, MD;  [facility]: [facility];  Service: Endoscopy;  Laterality: N/A;  . BIOPSY  09/26/2018   Procedure: BIOPSY;  Surgeon: Thornton Park, MD;  Location: [facility];  Service: Gastroenterology;;  . BIOPSY  01/09/2019   Procedure: BIOPSY;  Surgeon: Irving Copas., MD;  Location: [facility];  Service: Gastroenterology;;  . CESAREAN SECTION     x2  . DENTAL SURGERY     multiple extractions 3'15  . DILATION AND CURETTAGE OF UTERUS    . ESOPHAGOGASTRODUODENOSCOPY N/A 10/24/2013   Procedure: ESOPHAGOGASTRODUODENOSCOPY (EGD);  Surgeon: Gatha Mayer, MD;  Location: [facility];  Service: Endoscopy;  Laterality: N/A;  . ESOPHAGOGASTRODUODENOSCOPY N/A 01/09/2019   Procedure: ESOPHAGOGASTRODUODENOSCOPY (EGD);  Surgeon: Irving Copas., MD;  Location: [facility];  Service: Gastroenterology;  Laterality: N/A;  . ESOPHAGOGASTRODUODENOSCOPY (EGD) WITH PROPOFOL N/A 09/26/2018   Procedure: ESOPHAGOGASTRODUODENOSCOPY (EGD) WITH PROPOFOL;  Surgeon: Thornton Park, MD;  Location: [facility];  Service: Gastroenterology;  Laterality: N/A;  . ESOPHAGOGASTRODUODENOSCOPY (EGD) WITH PROPOFOL N/A 04/26/2019   Procedure: ESOPHAGOGASTRODUODENOSCOPY (EGD) WITH PROPOFOL;  Surgeon: Rush Landmark Telford Nab., MD;  Location: [facility];  Service: Gastroenterology;  Laterality: N/A;  . HEMOSTASIS CLIP PLACEMENT  04/26/2019   Procedure: HEMOSTASIS CLIP PLACEMENT;  Surgeon: Irving Copas., MD;  Location: [facility];  Service: Gastroenterology;;  . IR PARACENTESIS  09/23/2018   . LIVER BIOPSY  06/30/2012   Procedure: LIVER BIOPSY;  Surgeon: Shann Medal, MD;  [facility];  Service: General;;  . NOVASURE ABLATION    . SCLEROTHERAPY  04/26/2019   Procedure: Clide Deutscher;  Surgeon: Mansouraty, Telford Nab., MD;  Location: [facility];  Service: Gastroenterology;;  cyanoacrylate  . SHOULDER ARTHROSCOPY Right 2011  . UMBILICAL HERNIA REPAIR N/A 06/30/2012   Procedure: remove umbilicus;  Surgeon: Shann Medal, MD;  [facility];  Service: General;  Laterality: N/A;  . UPPER ESOPHAGEAL ENDOSCOPIC ULTRASOUND (EUS) N/A 01/09/2019   Procedure: UPPER ESOPHAGEAL ENDOSCOPIC ULTRASOUND (EUS);  Surgeon: Irving Copas., MD;  Location: [facility];  Service: Gastroenterology;  Laterality: N/A;  . VENTRAL HERNIA REPAIR N/A 06/30/2012   Procedure: LAPAROSCOPIC VENTRAL HERNIA;  Surgeon: Shann Medal, MD;  [facility];  Service: General;  Laterality: N/A;  With Mesh  . VIDEO BRONCHOSCOPY Bilateral 07/20/2018   Procedure: VIDEO BRONCHOSCOPY WITHOUT FLUORO;  Surgeon: Brand Males, MD;  [facility];  Service: Cardiopulmonary;  Laterality: Bilateral;  . WISDOM TOOTH EXTRACTION       OB History   No obstetric history on file.  Family History  Problem Relation Age of Onset  . Hypertension Mother   . Diabetes Mother   . Cirrhosis Mother        ? medications  . Lung cancer Father   . Stroke Father   . Diabetes Sister   . Hypertension Sister   . Colon cancer Neg Hx   . Esophageal cancer Neg Hx   . Rectal cancer Neg Hx     Social History   Tobacco Use  . Smoking status: Never Smoker  . Smokeless tobacco: Never Used  . Tobacco comment: 04/25/2019- "I smoke part of a cigarette and put it out,I did not like it."  Substance Use Topics  . Alcohol use: Not Currently  . Drug use: No    Home Medications Prior to Admission medications   Medication Sig Start Date End Date Taking? Authorizing Provider  Acetaminophen (APAP) 325 MG  tablet Take 2 tablets (650 mg total) by mouth every 6 (six) hours as needed. 05/27/19   Bonnita Hollow, MD  albuterol (PROAIR HFA) 108 (90 Base) MCG/ACT inhaler INHALE 2 PUFFS INTO THE LUNGS EVERY 6 HOURS AS NEEDED FOR SHORTNESS OF BREATH Patient taking differently: Inhale 2 puffs into the lungs every 4 (four) hours as needed for wheezing or shortness of breath.  04/25/18   Rory Percy, DO  albuterol (PROVENTIL) (2.5 MG/3ML) 0.083% nebulizer solution Take 3 mLs (2.5 mg total) by nebulization every 6 (six) hours as needed for wheezing or shortness of breath. 04/10/19   Brand Males, MD  AMBULATORY NON FORMULARY MEDICATION Nitroglycerin oint. 0.125% Apply a small pea size amount BID for 6-8 weeks 07/10/19   Gatha Mayer, MD  blood glucose meter kit and supplies Dispense based on patient and insurance preference. Use up to four times daily as directed. (FOR ICD-10 E10.9, E11.9). 09/02/18   Dessa Phi, DO  budesonide-formoterol (SYMBICORT) 80-4.5 MCG/ACT inhaler Inhale 2 puffs into the lungs 2 (two) times daily for 1 day. 01/11/19 03/08/20  Lauraine Rinne, NP  cetirizine (ZYRTEC) 10 MG tablet Take 1 tablet (10 mg total) by mouth daily. 01/27/17   Glenis Smoker, MD  dextromethorphan-guaiFENesin [facility] DM) 30-600 MG 12hr tablet Take 1-2 tablets by mouth at bedtime as needed for cough.    [provider]  diclofenac Sodium (VOLTAREN) 1 % GEL Apply 4 g topically 4 (four) times daily as needed (pain). 05/27/19   Brimage, Ronnette Juniper, DO  fluticasone (FLONASE) 50 MCG/ACT nasal spray Place 2 sprays into both nostrils in the morning and at bedtime.     [provider]  glucose blood (TRUE METRIX BLOOD GLUCOSE TEST) test strip Use as instructed 06/30/19   Meccariello, Bernita Raisin, DO  levothyroxine (SYNTHROID) 125 MCG tablet Take 1 tablet (125 mcg total) by mouth 2 (two) times daily. 06/28/19   Benay Pike, MD  metFORMIN (GLUCOPHAGE) 500 MG tablet Take 1 tablet (500 mg total) by  mouth 2 (two) times daily with a meal. 12/06/18   Meccariello, Bernita Raisin, DO  OXYGEN Inhale 2 L into the lungs continuous.     [provider]  pantoprazole (PROTONIX) 40 MG tablet Take 1 tablet (40 mg total) by mouth 2 (two) times daily before a meal. 10/17/18   Glenis Smoker, MD  SPIRIVA HANDIHALER 18 MCG inhalation capsule INHALE 1 CAPSULE VIA HANDIHALER ONCE DAILY AT THE SAME TIME EVERY DAY 01/09/19   Meccariello, Bernita Raisin, DO  TRUEplus Lancets 28G MISC Use to check blood sugar as directed. 09/16/18  Glenis Smoker, MD    Allergies    Aspirin  Review of Systems   Review of Systems Ten systems reviewed and are negative for acute change, except as noted in the HPI.   Physical Exam Updated Vital Signs BP 116/61   Pulse 88   Temp 99.4 F (37.4 C) (Oral)   Resp 18   LMP  (LMP Unknown)   SpO2 96%   Physical Exam Vitals and nursing note reviewed.  Constitutional:      General: She is not in acute distress.    Appearance: She is well-developed. She is not diaphoretic.  HENT:     Head: Normocephalic and atraumatic.  Eyes:     General: No scleral icterus.    Conjunctiva/sclera: Conjunctivae normal.  Cardiovascular:     Rate and Rhythm: Normal rate and regular rhythm.     Heart sounds: Normal heart sounds. No murmur. No friction rub. No gallop.   Pulmonary:     Effort: No respiratory distress.     Breath sounds: Wheezing present.  Abdominal:     General: Bowel sounds are normal. There is no distension.     Palpations: Abdomen is soft. There is no mass.     Tenderness: There is no abdominal tenderness. There is no guarding.  Musculoskeletal:     Cervical back: Normal range of motion.  Skin:    General: Skin is warm and dry.  Neurological:     Mental Status: She is alert and oriented to person, place, and time.  Psychiatric:        Behavior: Behavior normal.     ED Results / Procedures / Treatments   Labs (all labs ordered are listed, but only  abnormal results are displayed) Labs Reviewed  CBC WITH DIFFERENTIAL/PLATELET - Abnormal; Notable for the following components:      Result Value   WBC 1.1 (*)    RBC 3.52 (*)    Hemoglobin 9.0 (*)    HCT 29.3 (*)    MCH 25.6 (*)    Platelets 51 (*)    Neutro Abs 0.6 (*)    Lymphs Abs 0.2 (*)    All other components within normal limits  COMPREHENSIVE METABOLIC PANEL - Abnormal; Notable for the following components:   Chloride 112 (*)    CO2 21 (*)    Glucose, Bld 169 (*)    Calcium 8.8 (*)    Albumin 2.6 (*)    Alkaline Phosphatase 167 (*)    Total Bilirubin 1.7 (*)    All other components within normal limits  SARS CORONAVIRUS 2 (TAT 6-24 HRS)  POC SARS CORONAVIRUS 2 AG -  ED    EKG EKG Interpretation  Date/Time:  Thursday July 13 2019 14:58:52 EST Ventricular Rate:  101 PR Interval:  146 QRS Duration: 84 QT Interval:  352 QTC Calculation: 456 R Axis:   14 Text Interpretation: Sinus tachycardia Cannot rule out Anterior infarct , age undetermined Abnormal ECG No significant change was found Confirmed by Ezequiel Essex [id number]) on 07/13/2019 11:58:02 PM EKG shows sinus tachycardia at a rate of 101 likely secondary to albuterol use and difficulty with breathing.  Patient given IV Solu-Medrol, albuterol, magnesium she has some improvement is still very tight, coughing, wheezing.  Have given signout to Dr. Wyvonnia Dusky will assume care of the patient.  Radiology DG Chest Portable 1 View  Result Date: 07/13/2019 CLINICAL DATA:  54 year old female with shortness of breath EXAM: PORTABLE CHEST 1 VIEW COMPARISON:  Chest radiograph  dated 06/26/2019. FINDINGS: Shallow inspiration with minimal bibasilar atelectasis. Mild diffuse interstitial and vascular prominence similar to prior radiograph. No new consolidative changes. There is no pleural effusion or pneumothorax. Stable cardiomediastinal silhouette. No acute osseous pathology. IMPRESSION: No focal consolidation.  No significant  interval change. Electronically Signed   By: Anner Crete M.D.   On: 07/13/2019 17:21    Procedures Procedures (including critical care time)  Medications Ordered in ED Medications  albuterol (VENTOLIN HFA) 108 (90 Base) MCG/ACT inhaler 8 puff (8 puffs Inhalation Given 07/13/19 2304)  methylPREDNISolone sodium succinate (SOLU-MEDROL) 125 mg/2 mL injection 125 mg (125 mg Intravenous Given 07/13/19 2305)  magnesium sulfate IVPB 2 g 50 mL (0 g Intravenous Stopped 07/14/19 0015)    ED Course  I have reviewed the triage vital signs and the nursing notes.  Pertinent labs & imaging results that were available during my care of the patient were reviewed by me and considered in my medical decision making (see chart for details).    MDM Rules/Calculators/A&P                      This is a 54 year old female with interstitial lung disease, coughing, wheezing, worsening shortness of breath over 3 to 4 days sent in by her pulmonologist.  I personally reviewed the patient's labs which show chronic neutropenia and pancytopenia.  CMP shows elevated blood glucose, low bicarb suggestive of her chronic respiratory failure.  Elevated total bili and alk phos consistent with her liver disease.  Covid test negative.  I personally reviewed the patient's portable 1 view chest x-ray which shows no acute abnormalities. Final Clinical Impression(s) / ED Diagnoses Final diagnoses:  None    Rx / DC Orders ED Discharge Orders    None       Margarita Mail, PA-C 07/14/19 0038    Ezequiel Essex, MD 07/14/19 [phone]

## 2019-07-13 NOTE — Telephone Encounter (Signed)
Call transferred from front staff who were concerned with patient's breathing on the phone.  When speaking with patient, she was extremely dyspneic on the phone and had difficulty stringing together two words consecutively.  Patient also had some violent coughing on the phone that sounded very tight and high-pitched.    Symptoms began 2 days ago but have progressively gotten worse - now dyspneic all time and much worse with minimal activity.  Asked patient if she was home alone - her brother is there with her.  Advised patient would recommend that she call 911 for immediate assistance.  Patient asked if her brother may take her - advised patient that brother may take her only if they would be able to leave the house now.  Could hear patient speaking with her brother and he agreed to take her now.  Will sign and route to Dr Chase Caller to let him know of the above.

## 2019-07-14 ENCOUNTER — Emergency Department (HOSPITAL_COMMUNITY): Payer: Medicaid Other

## 2019-07-14 LAB — RESPIRATORY PANEL BY RT PCR (FLU A&B, COVID)
Influenza A by PCR: NEGATIVE
Influenza B by PCR: NEGATIVE
SARS Coronavirus 2 by RT PCR: NEGATIVE

## 2019-07-14 MED ORDER — DOXYCYCLINE HYCLATE 100 MG PO CAPS: 100.0000 mg | ORAL_CAPSULE | Freq: Two times a day (BID) | ORAL | 0 refills | Status: DC

## 2019-07-14 MED ORDER — PREDNISONE 20 MG PO TABS: 40.0000 mg | ORAL_TABLET | Freq: Every day | ORAL | 0 refills | Status: DC

## 2019-07-14 MED ORDER — IOHEXOL 350 MG/ML SOLN
75.0000 mL | Freq: Once | INTRAVENOUS | Status: AC | PRN
  Administered 2019-07-14: 75 mL via INTRAVENOUS

## 2019-07-14 MED FILL — predniSONE 20 MG TABS: 20 | 5 days supply | Qty: 10 | Fill #0

## 2019-07-14 MED FILL — DOXYCYCLINE HYCLATE 100 MG: 100 | 10 days supply | Qty: 20 | Fill #0

## 2019-07-14 NOTE — ED Notes (Signed)
Pt transported to CT

## 2019-07-14 NOTE — ED Notes (Signed)
Gave pt lunch bag and sprite to drink

## 2019-07-14 NOTE — Consult Note (Addendum)
[facility] Consult Note Summary  Patient name: Holly Mueller Medical record number: [id number] Date of birth: 05-15-1965 Age: 54 y.o. Gender: female Date of Admission: 07/13/2019  Date of Discharge: 07/14/2019 Admitting Physician: No admitting provider for patient encounter.  Primary Care Provider: Cleophas Dunker, DO Consultants: None  Discharge Diagnoses/Problem List:   Interstitial lung disease Restrictive lung disease Hypothyroidism Obesity GERD T2DM Pancytopenia Primary biliary cholangitis COPD Disposition: home, lives with sister  Discharge Condition: medically stable, at baseline  Discharge Exam:   Gen: Alert and Oriented x 3, NAD CV: RRR, no murmurs Resp: diffuse crackles in both lung bases bilaterally, no rhonchi, mildly decreased breath sounds with mild wheezing Abd: non-distended, non-tender, soft, +bs in all four quadrants Ext: no clubbing, cyanosis, or +1 pitting edema bilaterally Neuro: No gross deficits Skin: warm, dry, intact, no rashes  Brief Hospital Course:  Holly Mueller is a 54y/o female with complicated past medical history including ILD, COPD, restrictive lung disease, T2DM, Obesity, GERD, pancytopenia, primary biliary cholangitis, and hypothyroidism who presented to the ED after a virtual appointment with her pulmonologist who due to her subjective shortness of breath and increased respiratory effort advised she go to the ED. Upon arrival to the ED she was found to have normal oxygen saturation on 2L which is her home oxygen requirement. She was able to ambulate on 2L and keep her oxygen saturation up to 91-94% on 2L. She subjective endorses shortness of breath and her respiratory rate does at times increase into the 20s however this is her baseline. Upon her last admission I saw and admitted her and overall her breathing looks improved from her last hospital admission. She can talk normally in full sentences without  stopping to take a breath which she could not do last time. Last admission she did have a new oxygen requirement of 3L. She denies chest pain and she had mild tachycardia in the ED which resolved. Her EKG showed sinus tachycardia which was unchanged from her previous EKG on 2/15. This is most likely worsening of her ILD and I have instructed her to restart Prednisone 69m for 5 days and to make sure she is taking her breathing treatments at home scheduled every 6 hours until she is seen and evaluated in our clinic. I also requested she get a COVID-19 test while in the hospital so she could be seen in person by her pulmonologist. I feel it is vital to her overall health care to be seen in person and evaluated by them as we would greatly appreciate any recommendations they may have to help treat her ILD. It seems to be slowly and progressively getting worse. Other differentials considered were DVT/PE however given Wells' score of 1 and no chest pain unlikely. Cardiac etiology considered but with no active chest pain and an unchanged EKG this was less likely as well. Heart failure was considered but CXR unchanged from previous on 2/15 and showed no vascular congestion or pulmonary effusion or consolidation. BNP not drawn today but normal last admission. She had trace to +1 pitting edema bilaterally however this was improved when compared to her presentation to the ED on 2/15.   Patient instructed to return to the ED immediatly you develop chest pain, have increased oxygen requirement, develop extreme fatigue, dizziness, or have syncope or near syncope  Issues for Follow Up:  Please make your appointment with [place] 07/18/2019 @ 10:10am at the [facility] Please contact your Pulmonologist and ask  to be seen in person within the next 5 days  Significant Procedures:  None  Significant Labs and Imaging:  Recent Labs  Lab 07/13/19 1514  WBC 1.1*  HGB 9.0*  HCT 29.3*  PLT 51*   Recent Labs  Lab  07/13/19 1514  NA 139  K 3.9  CL 112*  CO2 21*  GLUCOSE 169*  BUN 10  CREATININE 0.59  CALCIUM 8.8*  ALKPHOS 167*  AST 41  ALT 32  ALBUMIN 2.6*    COVID-19 Rapid: Negative EKG: Sinus tachycardia CXR: No consolidation, no significant interval change.  Results/Tests Pending at Time of Discharge:   None  Discharge Medications:  Allergies as of 07/14/2019       Reactions   Aspirin Nausea Only, Other (See Comments)   Upset stomach        Medication List     TAKE these medications    albuterol 108 (90 Base) MCG/ACT inhaler Commonly known as: ProAir HFA INHALE 2 PUFFS INTO THE LUNGS EVERY 6 HOURS AS NEEDED FOR SHORTNESS OF BREATH What changed:  how much to take how to take this when to take this reasons to take this additional instructions   albuterol (2.5 MG/3ML) 0.083% nebulizer solution Commonly known as: PROVENTIL Take 3 mLs (2.5 mg total) by nebulization every 6 (six) hours as needed for wheezing or shortness of breath. What changed: Another medication with the same name was changed. Make sure you understand how and when to take each.   AMBULATORY NON FORMULARY MEDICATION Nitroglycerin oint. 0.125% Apply a small pea size amount BID for 6-8 weeks   APAP 325 MG tablet Take 2 tablets (650 mg total) by mouth every 6 (six) hours as needed.   blood glucose meter kit and supplies Dispense based on patient and insurance preference. Use up to four times daily as directed. (FOR ICD-10 E10.9, E11.9).   budesonide-formoterol 80-4.5 MCG/ACT inhaler Commonly known as: Symbicort Inhale 2 puffs into the lungs 2 (two) times daily for 1 day.   cetirizine 10 MG tablet Commonly known as: ZYRTEC Take 1 tablet (10 mg total) by mouth daily.   dextromethorphan-guaiFENesin 30-600 MG 12hr tablet Commonly known as: MUCINEX DM Take 1-2 tablets by mouth at bedtime as needed for cough.   diclofenac Sodium 1 % Gel Commonly known as: VOLTAREN Apply 4 g topically 4 (four)  times daily as needed (pain).   doxycycline 100 MG capsule Commonly known as: VIBRAMYCIN Take 1 capsule (100 mg total) by mouth 2 (two) times daily.   fluticasone 50 MCG/ACT nasal spray Commonly known as: FLONASE Place 2 sprays into both nostrils in the morning and at bedtime.   levothyroxine 125 MCG tablet Commonly known as: SYNTHROID Take 1 tablet (125 mcg total) by mouth 2 (two) times daily.   metFORMIN 500 MG tablet Commonly known as: GLUCOPHAGE Take 1 tablet (500 mg total) by mouth 2 (two) times daily with a meal.   OXYGEN Inhale 2 L into the lungs continuous.   pantoprazole 40 MG tablet Commonly known as: PROTONIX Take 1 tablet (40 mg total) by mouth 2 (two) times daily before a meal.   predniSONE 20 MG tablet Commonly known as: Deltasone Take 2 tablets (40 mg total) by mouth daily with breakfast.   Spiriva HandiHaler 18 MCG inhalation capsule Generic drug: tiotropium INHALE 1 CAPSULE VIA HANDIHALER ONCE DAILY AT THE SAME TIME EVERY DAY   True Metrix Blood Glucose Test test strip Generic drug: glucose blood Use as instructed   TRUEplus  Lancets 28G Misc Use to check blood sugar as directed.        Discharge Instructions: Please refer to Patient Instructions section of EMR for full details.  Patient was counseled important signs and symptoms that should prompt return to medical care, changes in medications, dietary instructions, activity restrictions, and follow up appointments.   Follow-Up Appointments: Follow-up Information     Croitor[place], Dani Gobble, MD .   Specialty: Cardiology Contact information: [address] [place] [address] [phone]         Brand Males, MD. Schedule an appointment as soon as possible for a visit in 3 day(s).   Specialty: Pulmonary Disease Why: Please insist on being seen in person so you can be seen and evaluated in their clinic. Contact information: [place]  [id number] [phone]            Nuala Alpha, DO 07/14/2019, 6:53 AM PGY-3, [place]

## 2019-07-14 NOTE — Discharge Instructions (Addendum)
You were seen in the ED for subjective shortness of breath most likely due to your chronic lung disease. In the ED, your oxygen saturation was normal and you were able to maintain your oxygen saturation on your home oxygen level of 2L while walking. Your chest x-ray was unchanged from previous visits and your vital signs and labs were at baseline.   I have made you an appointment in our clinic on Tuesday, March 9th at 10:10am. Please be sure to keep this appointment. I have also requested you call your Pulmonologist to have an in-person appointment as I feel it is vital to your health and well being that you be seen and evaluated physically in their office so they can make the best recommendations in regards to your health.  For the next 5 days please take your breathing treatments scheduled as prescribed until you are seen and evaluated in our clinic.

## 2019-07-14 NOTE — Discharge Summary (Deleted)
[facility] Discharge Summary  Patient name: Holly Mueller Medical record number: [id number] Date of birth: Jan 08, 1966 Age: 54 y.o. Gender: female Date of Admission: 07/13/2019  Date of Discharge: 07/14/2018 Admitting Physician: No admitting provider for patient encounter.  Primary Care Provider: Cleophas Dunker, DO Consultants: None  Discharge Diagnoses/Problem List:  Interstitial lung disease Restrictive lung disease Hypothyroidism Obesity GERD T2DM Pancytopenia Primary biliary cholangitis COPD  Disposition: home with sister  Discharge Condition: medically stable, at baseline  Discharge Exam:   Gen: Alert and Oriented x 3, NAD CV: RRR, no murmurs Resp: diffuse crackles in both lung bases bilaterally, no rhonchi, mildly decreased breath sounds with mild wheezing Abd: non-distended, non-tender, soft, +bs in all four quadrants Ext: no clubbing, cyanosis, or +1 pitting edema bilaterally Neuro: No gross deficits Skin: warm, dry, intact, no rashes  Brief Hospital Course:  Holly Mueller is a 54y/o female with complicated past medical history including ILD, COPD, restrictive lung disease, T2DM, Obesity, GERD, pancytopenia, primary biliary cholangitis, and hypothyroidism who presented to the ED after a virtual appointment with her pulmonologist who due to her subjective shortness of breath and increased respiratory effort advised she go to the ED. Upon arrival to the ED she was found to have normal oxygen saturation on 2L which is her home oxygen requirement. She was able to ambulate on 2L and keep her oxygen saturation up to 91-94% on 2L. She subjective endorses shortness of breath and her respiratory rate does at times increase into the 20s however this is her baseline. Upon her last admission I saw and admitted her and overall her breathing looks improved from her last hospital admission. She can talk normally in full sentences without stopping to  take a breath which she could not do last time. Last admission she did have a new oxygen requirement of 3L. She denies chest pain and she had mild tachycardia in the ED which resolved. Her EKG showed sinus tachycardia which was unchanged from her previous EKG on 2/15. This is most likely worsening of her ILD and I have instructed her to restart Prednisone 27m for 5 days and to make sure she is taking her breathing treatments at home scheduled every 6 hours until she is seen and evaluated in our clinic. I also requested she get a COVID-19 test while in the hospital so she could be seen in person by her pulmonologist. I feel it is vital to her overall health care to be seen in person and evaluated by them as we would greatly appreciate any recommendations they may have to help treat her ILD. It seems to be slowly and progressively getting worse. Other differentials considered were DVT/PE however given Wells' score of 1 and no chest pain unlikely. Cardiac etiology considered but with no active chest pain and an unchanged EKG this was less likely as well. Heart failure was considered but CXR unchanged from previous on 2/15 and showed no vascular congestion or pulmonary effusion or consolidation. BNP not drawn today but normal last admission. She had trace to +1 pitting edema bilaterally however this was improved when compared to her presentation to the ED on 2/15.  Patient instructed to return to the ED immediatly you develop chest pain, have increased oxygen requirement, develop extreme fatigue, dizziness, or have syncope or near syncope  Issues for Follow Up:  1. Please [facility] [facility]. Please contact your Pulmonologist and ask to  be seen in person within the next 5 days  Significant Procedures:   EKG: Sinus tachycardia CXR: No consolidation, no significant interval change.  Significant Labs and Imaging:  Recent Labs  Lab  07/13/19 1514  WBC 1.1*  HGB 9.0*  HCT 29.3*  PLT 51*   Recent Labs  Lab 07/13/19 1514  NA 139  K 3.9  CL 112*  CO2 21*  GLUCOSE 169*  BUN 10  CREATININE 0.59  CALCIUM 8.8*  ALKPHOS 167*  AST 41  ALT 32  ALBUMIN 2.6*      Results/Tests Pending at Time of Discharge:   COVID-19 rapid  Discharge Medications:  Allergies as of 07/14/2019      Reactions   Aspirin Nausea Only, Other (See Comments)   Upset stomach      Medication List    TAKE these medications   albuterol 108 (90 Base) MCG/ACT inhaler Commonly known as: ProAir HFA INHALE 2 PUFFS INTO THE LUNGS EVERY 6 HOURS AS NEEDED FOR SHORTNESS OF BREATH What changed:   how much to take  how to take this  when to take this  reasons to take this  additional instructions   albuterol (2.5 MG/3ML) 0.083% nebulizer solution Commonly known as: PROVENTIL Take 3 mLs (2.5 mg total) by nebulization every 6 (six) hours as needed for wheezing or shortness of breath. What changed: Another medication with the same name was changed. Make sure you understand how and when to take each.   AMBULATORY NON FORMULARY MEDICATION Nitroglycerin oint. 0.125% Apply a small pea size amount BID for 6-8 weeks   APAP 325 MG tablet Take 2 tablets (650 mg total) by mouth every 6 (six) hours as needed.   blood glucose meter kit and supplies Dispense based on patient and insurance preference. Use up to four times daily as directed. (FOR ICD-10 E10.9, E11.9).   budesonide-formoterol 80-4.5 MCG/ACT inhaler Commonly known as: Symbicort Inhale 2 puffs into the lungs 2 (two) times daily for 1 day.   cetirizine 10 MG tablet Commonly known as: ZYRTEC Take 1 tablet (10 mg total) by mouth daily.   dextromethorphan-guaiFENesin 30-600 MG 12hr tablet Commonly known as: MUCINEX DM Take 1-2 tablets by mouth at bedtime as needed for cough.   diclofenac Sodium 1 % Gel Commonly known as: VOLTAREN Apply 4 g topically 4 (four) times daily as  needed (pain).   fluticasone 50 MCG/ACT nasal spray Commonly known as: FLONASE Place 2 sprays into both nostrils in the morning and at bedtime.   levothyroxine 125 MCG tablet Commonly known as: SYNTHROID Take 1 tablet (125 mcg total) by mouth 2 (two) times daily.   metFORMIN 500 MG tablet Commonly known as: GLUCOPHAGE Take 1 tablet (500 mg total) by mouth 2 (two) times daily with a meal.   OXYGEN Inhale 2 L into the lungs continuous.   pantoprazole 40 MG tablet Commonly known as: PROTONIX Take 1 tablet (40 mg total) by mouth 2 (two) times daily before a meal.   predniSONE 20 MG tablet Commonly known as: Deltasone Take 2 tablets (40 mg total) by mouth daily with breakfast.   Spiriva HandiHaler 18 MCG inhalation capsule Generic drug: tiotropium INHALE 1 CAPSULE VIA HANDIHALER ONCE DAILY AT THE SAME TIME EVERY DAY   True Metrix Blood Glucose Test test strip Generic drug: glucose blood Use as instructed   TRUEplus Lancets 28G Misc Use to check blood sugar as directed.       Discharge Instructions: Please refer to Patient Instructions section  of EMR for full details.  Patient was counseled important signs and symptoms that should prompt return to medical care, changes in medications, dietary instructions, activity restrictions, and follow up appointments.   Follow-Up Appointments: Follow-up Information    Croitoru, Dani Gobble, MD .   Specialty: Cardiology Contact information: [address] [place] [address] [phone]        Brand Males, MD. Schedule an appointment as soon as possible for a visit in 3 day(s).   Specialty: Pulmonary Disease Why: Please insist on being seen in person so you can be seen and evaluated in their clinic. Contact information: [place] [address] [phone]           Nuala Alpha, DO 07/14/2019, 2:07 AM PGY-3, [place]

## 2019-07-14 NOTE — ED Notes (Signed)
Pt COVID swabbed. Awaiting orders to see if pt will be admitted.

## 2019-07-14 NOTE — ED Provider Notes (Signed)
Patient seen by family practice team.  They know patient well.  They feel she is near her baseline.  She is not requiring any additional oxygen other than her normal 3 L.  No fever or chills.  No chest pain.  She has chronic pancytopenia which is unchanged.  Mild dyspnea with conversation.  Patient with scattered cough.  Lungs with good air exchange with inspiratory expiratory wheezing  Family practice residents have seen patient will prescribe antibiotics and steroids for home use.  They do not feel she needs to be admitted.  Patient stable on her nasal cannula.  She is instructed to use her bronchodilators, will provide steroids and antibiotics.  CT scan today does not show evidence of pulmonary embolism but does show her chronic lung disease.  Covid testing is negative.  Chronic pancytopenia without evidence of fever.  Patient has follow-up with her PCP on March 9.  She is also instructed to follow-up with pulmonology per family practice residents. Return precautions discussed  BP 111/67   Pulse 95   Temp 98.2 F (36.8 C) (Oral)   Resp (!) 22   LMP  (LMP Unknown)   SpO2 96%   Holly Mueller was evaluated in Emergency Department on 07/14/2019 for the symptoms described in the history of present illness. She was evaluated in the context of the global COVID-19 pandemic, which necessitated consideration that the patient might be at risk for infection with the SARS-CoV-2 virus that causes COVID-19. Institutional protocols and algorithms that pertain to the evaluation of patients at risk for COVID-19 are in a state of rapid change based on information released by regulatory bodies including the CDC and federal and state organizations. These policies and algorithms were followed during the patient's care in the ED.    Ezequiel Essex, MD 07/14/19 [phone]

## 2019-07-14 NOTE — ED Notes (Signed)
Pt stayed between 91-94% while ambulating on 2l.

## 2019-07-17 ENCOUNTER — Other Ambulatory Visit: Payer: Self-pay

## 2019-07-17 ENCOUNTER — Encounter: Payer: Self-pay | Admitting: Primary Care

## 2019-07-17 ENCOUNTER — Ambulatory Visit: Payer: Medicaid Other | Admitting: Primary Care

## 2019-07-17 DIAGNOSIS — J849 Interstitial pulmonary disease, unspecified: Secondary | ICD-10-CM | POA: Diagnosis not present

## 2019-07-17 DIAGNOSIS — J9611 Chronic respiratory failure with hypoxia: Secondary | ICD-10-CM | POA: Diagnosis not present

## 2019-07-17 DIAGNOSIS — J984 Other disorders of lung: Secondary | ICD-10-CM | POA: Diagnosis not present

## 2019-07-17 NOTE — Assessment & Plan Note (Addendum)
-  Severe restriction on 2018 pulmonary function testing; no obstruction  - Continue Symbicort 80 two puffs twice daily; Spiriva Handihaler two puffs once daily - Albuterol every 4-6 hours as needed for shortness of breath/wheezing - Complete prednisone and Doxycycline as prescribed per ED  - PFTs scheduled for April

## 2019-07-17 NOTE — Progress Notes (Signed)
_0  ID: [name], female    DOB: [birth year], 54 y.o.   MRN: [id number]  No chief complaint on file.   Referring provider: Cleophas Dunker, *   Summary: Follow-up probable UIP on CT scan of the chest November 2019 with worsening compared to July 2018.  Female gender. Positive ANA and SSA associated with clubbing and bilateral basal Velcro crackles -.  Associated with cirrhosis Hepatic cirrhosis ] due to primary biliary cholangitis (HCC) and low platelets, and gastric varices with bleeding. BAL March 2020 with mixed ceullarity - 54% PMN nand 28% lymphs c/w UIP. On supportive care due to cirrhosis . Recommended disability end sept 2026  HPI: 54 year old female, never smoked. PMH significant for ILD, asthma, restrictive lung disease, chronic respiratory failure, cough cough, GERD, biliary cholangitis, hepatic cirrhosis, pancytopenia, type 2 diabetes, anemia, gastric varices, CKD, hx seizures. Patient of Dr. Chase Caller, last seen on 06/26/19 by pulmonary NP for acute televisit with dyspnea and cough who was ultimately sent to ED and hospitalized for COPD exacerbation. Maintained on Symbicort 80, Spiriva handihaler, prn albuterol hfa/neb and 2L oxygen. Probably UIP on CT chest November 2019, progression since July 2018. Positive ANA and SSA. Repeat PFTs in 2020 showed stable FVC but decrease in DLCO. Discussed with patient that treatment with anti-fibrotics are risky in the presence of cirrhosis.   ED visit 3/4-07/14/19 Presented with 3-4 days of shortness of breath with associated cough, chest tightness and wheezing. Low grade temp. Requiring 3L oxygen. Covid negative. CXR without acute abnormality. CT chest negative for PE, findings suggestive of UIP as seen back in January 2021. New streaky/hazy airspace opacities in posterior lower lungs possibly d.t atelectasis or early infectious etiology. Sent home on prednisone 5m x 5 days and Doxycycline course.   07/17/2019 Patient presents  today for ED follow-up. She feels somewhat better but drained. She gets short of breath moving around, which is not necessarily new for her. She has two days left of prednisone course and continue oral doxycycline. Takes Symbicort 80 twice daily and Spiriva as prescribed daily. For the first couple of days after leaving the hospital she needed to use her albuterol rescue inhaler every 4 hours. Now requires SABA once or twice a day. She has a chronic dry cough, takes mucinex at night a few times a week. Wearing 2-3L oxygen at home. Lives with her brother and sister. Mainly stays in bed, playing games, but she does get up and move around. States that she feeds her sister's cat. She has been afebrile since ED stay. Denies fever, chills, wheezing, chest tightness,  nausea, vomiting, abdominal pain, diarrhea. She has not received Covid vaccine.   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  No dampness, heart type, Jacuzzi Smoking history: Denies smoking.  She is exposed to secondhand smoke Travel history: No significant travel history Relevant family history: No significant family history of lung disease.  Significant testing: Most recent CT scan of the chest in late 2019 is reported as probable UIP based on craniocaudal gradient and some traction bronchiectasis without honeycombing.  The findings are early and mild.   Serologies shows positive ANA, SSA- referred to rheumatology  PFTs from July 2018 which suggest restriction.  Evaluated for aspiration.  Swallow eval was normal  Allergies  Allergen Reactions  . Aspirin Nausea Only and Other (See Comments)    Upset stomach    Immunization  History  Administered Date(s) Administered  . Influenza-Unspecified 01/11/2019  . Pneumococcal Polysaccharide-23 08/26/2011, 09/12/2017, 05/25/2019  . Td 05/11/2006  . Tdap 05/30/2019    Past Medical History:  Diagnosis  Date  . Acute respiratory failure with hypoxia ([place]) 09/22/2018  . Anxiety    "anxiety attacks- sometimes"  . Arthritis    bursitis left hip flares-not an issue  . Chronic kidney disease   . Community acquired pneumonia 11/28/2018  . Diabetes mellitus without complication ([place])    Type II  . Dysrhythmia   . Edentulous    10-19-13 at present  . Epilepsy ([place]) in 1972   No seizures since 1972. Previously treated with phenobarbital.   . Gastric varices with bleeding   . GERD (gastroesophageal reflux disease) 2008  . 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 Headache(784.0)    hx of migraines   . Heart murmur   . 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 ([place])   . History of blood transfusion    gi bleed  . History of kidney stones    passed  . Hypothyroidism 2008  . Interstitial lung disease ([place])   . Leukopenia   . Lower esophageal ring 08/18/2013  . Pneumonia   . Primary biliary cholangitis ([place]) 10/24/2018  . Seasonal allergies 2003  . SEIZURES, HX OF 12/12/2009   Annotation: last seizure in early 1970s Qualifier: Diagnosis of  By: Carlena Sax  MD, Colletta [place]    . Shortness of breath    walking  . Wears glasses     Tobacco History: Social History   Tobacco Use  Smoking Status Never Smoker  Smokeless Tobacco Never Used  Tobacco Comment   04/25/2019- "I smoke part of a cigarette and put it out,I did not like it."   Counseling given: Not Answered Comment: 04/25/2019- "I smoke part of a cigarette and put it out,I did not like it."   Outpatient Medications Prior to Visit  Medication Sig Dispense Refill  . Acetaminophen (APAP) 325 MG tablet Take 2 tablets (650 mg total) by mouth every 6 (six) hours as needed. 30 tablet   . albuterol (PROAIR HFA) 108 (90 Base) MCG/ACT inhaler INHALE 2 PUFFS INTO THE LUNGS EVERY 6 HOURS AS NEEDED FOR SHORTNESS OF BREATH (Patient taking differently: Inhale 2 puffs into the lungs every 4  (four) hours as needed for wheezing or shortness of breath. ) 8.5 g 5  . albuterol (PROVENTIL) (2.5 MG/3ML) 0.083% nebulizer solution Take 3 mLs (2.5 mg total) by nebulization every 6 (six) hours as needed for wheezing or shortness of breath. 75 mL 2  . AMBULATORY NON FORMULARY MEDICATION Nitroglycerin oint. 0.125% Apply a small pea size amount BID for 6-8 weeks 30 g 1  . blood glucose meter kit and supplies Dispense based on patient and insurance preference. Use up to four times daily as directed. (FOR ICD-10 E10.9, E11.9). 1 each 0  . budesonide-formoterol (SYMBICORT) 80-4.5 MCG/ACT inhaler Inhale 2 puffs into the lungs 2 (two) times daily for 1 day. 1 Inhaler 0  . cetirizine (ZYRTEC) 10 MG tablet Take 1 tablet (10 mg total) by mouth daily. 30 tablet 11  . dextromethorphan-guaiFENesin (MUCINEX DM) 30-600 MG 12hr tablet Take 1-2 tablets by mouth at bedtime as needed for cough.    . diclofenac Sodium (VOLTAREN) 1 % GEL Apply 4 g topically 4 (four) times daily as needed (pain). 50 g 0  . doxycycline (VIBRAMYCIN) 100 MG  capsule Take 1 capsule (100 mg total) by mouth 2 (two) times daily. 20 capsule 0  . fluticasone (FLONASE) 50 MCG/ACT nasal spray Place 2 sprays into both nostrils in the morning and at bedtime.     Marland Kitchen glucose blood (TRUE METRIX BLOOD GLUCOSE TEST) test strip Use as instructed 100 each 12  . levothyroxine (SYNTHROID) 125 MCG tablet Take 1 tablet (125 mcg total) by mouth 2 (two) times daily.    . metFORMIN (GLUCOPHAGE) 500 MG tablet Take 1 tablet (500 mg total) by mouth 2 (two) times daily with a meal. 180 tablet 2  . OXYGEN Inhale 2 L into the lungs continuous.     . pantoprazole (PROTONIX) 40 MG tablet Take 1 tablet (40 mg total) by mouth 2 (two) times daily before a meal. 60 tablet 3  . predniSONE (DELTASONE) 20 MG tablet Take 2 tablets (40 mg total) by mouth daily with breakfast. 10 tablet 0  . SPIRIVA HANDIHALER 18 MCG inhalation capsule INHALE 1 CAPSULE VIA HANDIHALER ONCE DAILY AT  THE SAME TIME EVERY DAY 30 capsule 0  . TRUEplus Lancets 28G MISC Use to check blood sugar as directed. 100 each 1   No facility-administered medications prior to visit.   Review of Systems  Review of Systems  Constitutional: Positive for fatigue. Negative for fever.  Respiratory: Positive for cough. Negative for chest tightness and wheezing.        Dyspnea   Physical Exam  BP 112/66 (BP Location: Left Arm, Cuff Size: Normal)   Pulse 88   Temp (!) 97.3 F (36.3 C) (Temporal)   Ht _0  (1.676 m)   Wt 246 lb 12.8 oz (111.9 kg)   LMP  (LMP Unknown)   SpO2 95% Comment: 2L  BMI 39.83 kg/m  Physical Exam Constitutional:      General: She is not in acute distress.    Appearance: Normal appearance. She is obese. She is not ill-appearing, toxic-appearing or diaphoretic.  HENT:     Head: Normocephalic and atraumatic.     Mouth/Throat:     Mouth: Mucous membranes are moist.     Pharynx: Oropharynx is clear.  Cardiovascular:     Rate and Rhythm: Normal rate and regular rhythm.  Pulmonary:     Effort: Pulmonary effort is normal.     Comments: Velco crackles to bilateral bases; dyspnea on exertion; 3L portable oxygen Abdominal:     Palpations: Abdomen is soft.     Tenderness: There is no abdominal tenderness.  Musculoskeletal:        General: Normal range of motion.     Cervical back: Normal range of motion and neck supple.     Comments: Amb with steady gait  Skin:    General: Skin is warm and dry.  Neurological:     General: No focal deficit present.     Mental Status: She is alert and oriented to person, place, and time. Mental status is at baseline.  Psychiatric:        Mood and Affect: Mood normal.        Behavior: Behavior normal.        Thought Content: Thought content normal.        Judgment: Judgment normal.      Lab Results:  CBC    Component Value Date/Time   WBC 1.1 (LL) 07/13/2019 1514   RBC 3.52 (L) 07/13/2019 1514   HGB 9.0 (L) 07/13/2019 1514   HGB  10.3 (L) 06/01/2019 1435  HGB 10.1 (L) 04/28/2019 1355   HGB 12.5 11/17/2016 1204   HCT 29.3 (L) 07/13/2019 1514   HCT 30.7 (L) 04/28/2019 1355   HCT 36.2 11/17/2016 1204   PLT 51 (L) 07/13/2019 1514   PLT 56 (L) 06/01/2019 1435   PLT 64 (LL) 09/13/2018 1447   MCV 83.2 07/13/2019 1514   MCV 87 04/28/2019 1355   MCV 86.8 11/17/2016 1204   MCH 25.6 (L) 07/13/2019 1514   MCHC 30.7 07/13/2019 1514   RDW 15.5 07/13/2019 1514   RDW 14.8 04/28/2019 1355   RDW 14.1 11/17/2016 1204   LYMPHSABS 0.2 (L) 07/13/2019 1514   LYMPHSABS 0.3 (L) 04/28/2019 1355   LYMPHSABS 0.6 (L) 11/17/2016 1204   MONOABS 0.2 07/13/2019 1514   MONOABS 0.2 11/17/2016 1204   EOSABS 0.1 07/13/2019 1514   EOSABS 0.1 04/28/2019 1355   BASOSABS 0.0 07/13/2019 1514   BASOSABS 0.0 04/28/2019 1355   BASOSABS 0.0 11/17/2016 1204    BMET    Component Value Date/Time   NA 139 07/13/2019 1514   NA 136 05/30/2019 1611   NA 137 11/17/2016 1208   K 3.9 07/13/2019 1514   K 4.2 11/17/2016 1208   CL 112 (H) 07/13/2019 1514   CO2 21 (L) 07/13/2019 1514   CO2 24 11/17/2016 1208   GLUCOSE 169 (H) 07/13/2019 1514   GLUCOSE 216 (H) 11/17/2016 1208   BUN 10 07/13/2019 1514   BUN 9 05/30/2019 1611   BUN 7.2 11/17/2016 1208   CREATININE 0.59 07/13/2019 1514   CREATININE 0.68 06/01/2019 1435   CREATININE 0.8 11/17/2016 1208   CALCIUM 8.8 (L) 07/13/2019 1514   CALCIUM 9.3 11/17/2016 1208   GFRNONAA >60 07/13/2019 1514   GFRNONAA >60 06/01/2019 1435   GFRAA >60 07/13/2019 1514   GFRAA >60 06/01/2019 1435    BNP    Component Value Date/Time   BNP 27.6 06/26/2019 1755    ProBNP    Component Value Date/Time   PROBNP 35.0 09/22/2018 1420    Imaging: DG Chest 2 View  Result Date: 06/26/2019 CLINICAL DATA:  Chest pain and shortness of breath EXAM: CHEST - 2 VIEW COMPARISON:  May 23 2019 FINDINGS: There is atelectasis in the bases. There is mild generalized interstitial thickening. The lungs elsewhere are  clear. Heart size and pulmonary vascularity are normal. No adenopathy. No bone lesions. No pneumothorax. IMPRESSION: Bibasilar atelectasis. Interstitial thickening may reflect chronic inflammatory type change. No edema or airspace opacity. Cardiac silhouette within normal limits. No adenopathy. Electronically Signed   By: Lowella Grip III M.D.   On: 06/26/2019 13:15   CT Angio Chest PE W and/or Wo Contrast  Result Date: 07/14/2019 CLINICAL DATA:  Shortness of breath and cough EXAM: CT ANGIOGRAPHY CHEST WITH CONTRAST TECHNIQUE: Multidetector CT imaging of the chest was performed using the standard protocol during bolus administration of intravenous contrast. Multiplanar CT image reconstructions and MIPs were obtained to evaluate the vascular anatomy. CONTRAST:  52m OMNIPAQUE IOHEXOL 350 MG/ML SOLN COMPARISON:  May 23, 2019 FINDINGS: Cardiovascular: There is slightly suboptimal opacification the main pulmonary artery. No central or large segmental pulmonary embolism. There is limited visualization of the subsegmental pulmonary arteries. The heart is normal in size. No pericardial effusion or thickening. No evidence right heart strain. There is normal three-vessel brachiocephalic anatomy without proximal stenosis. The thoracic aorta is normal in appearance. Mediastinum/Nodes: No hilar, mediastinal, or axillary adenopathy. Again noted is a low-density lesion seen within the central thyroid isthmus. Lungs/Pleura: Again noted are  peripherally based ground-glass opacities within both upper lungs with tree-in-bud opacities. There is also peripherally based patchy ground-glass opacity seen at both lung bases. There is new streaky airspace opacity seen at the posterior lower lungs, right greater than left. Small amount bronchiectasis is seen within the anterior left lung base. Upper Abdomen: Again noted is a nodular liver contour. Musculoskeletal: No chest wall abnormality. No acute or significant osseous  findings. Review of the MIP images confirms the above findings. IMPRESSION: Limited suboptimal opacification of the main pulmonary artery. No central or large segmental pulmonary embolism. Again noted are findings suggestive of usual interstitial pneumonia as on the prior exam dating back to May 23, 2019. New streaky/hazy airspace opacities seen at the posterior lower lungs which could be due to atelectasis or early infectious etiology. Electronically Signed   By: Prudencio Pair M.D.   On: 07/14/2019 05:49   DG Chest Portable 1 View  Result Date: 07/13/2019 CLINICAL DATA:  54 year old female with shortness of breath EXAM: PORTABLE CHEST 1 VIEW COMPARISON:  Chest radiograph dated 06/26/2019. FINDINGS: Shallow inspiration with minimal bibasilar atelectasis. Mild diffuse interstitial and vascular prominence similar to prior radiograph. No new consolidative changes. There is no pleural effusion or pneumothorax. Stable cardiomediastinal silhouette. No acute osseous pathology. IMPRESSION: No focal consolidation.  No significant interval change. Electronically Signed   By: Anner Crete M.D.   On: 07/13/2019 17:21     Assessment & Plan:   Restrictive lung disease - Severe restriction on 2018 pulmonary function testing; no obstruction  - Continue Symbicort 80 two puffs twice daily; Spiriva Handihaler two puffs once daily - Albuterol every 4-6 hours as needed for shortness of breath/wheezing - Complete prednisone and Doxycycline as prescribed per ED  - PFTs scheduled for April    ILD (interstitial lung disease) ([place]) - Probably UIP on CT chest November 2019, progression since July 2018. Positive ANA and SSA - Treatment with anti-fibrotic's are risky d/t liver cirrhosis, patient continues to be interested in discussing medication options  - Follow up visit scheduled in April with Dr. Alveria Apley   Chronic respiratory failure with hypoxia ([place]) - Baseline wears 2L oxygen at home - Requiring 3L on  portable concentrator to keep O2 >88-90%    Martyn Ehrich, NP 07/17/2019

## 2019-07-17 NOTE — Assessment & Plan Note (Signed)
-  Baseline wears 2L oxygen at home - Requiring 3L on portable concentrator to keep O2 >88-90%

## 2019-07-17 NOTE — Patient Instructions (Addendum)
Recommendations: - Continue Symbicort twice daily - Continue Spiriva once daily - Continue Albuterol every 4-6 hours as needed for shortness of breath/wheezing - Complete prednisone and Doxycycline as prescribed per ED   Follow-up: - Pulmonary function testing on April 28th at 4pm - Visit with Dr. Chase Caller on April 29th at [place] can be found at: [email or web address] For questions related to vaccine distribution or appointments, please email [email or web address] or call [phone].

## 2019-07-17 NOTE — Assessment & Plan Note (Addendum)
-  Probably UIP on CT chest November 2019, progression since July 2018. Positive ANA and SSA - Treatment with anti-fibrotic's are risky d/t liver cirrhosis, patient continues to be interested in discussing medication options  - Encouraged patient consider getting COVID-19 vaccine, given contact information  - Follow up visit scheduled in April with Dr. Alveria Apley

## 2019-07-18 ENCOUNTER — Telehealth: Payer: Self-pay | Admitting: Internal Medicine

## 2019-07-18 ENCOUNTER — Ambulatory Visit: Payer: Medicaid Other | Admitting: Family Medicine

## 2019-07-18 NOTE — Telephone Encounter (Signed)
Spoke with the pt She states that she is needing to letter stating her condition for unemployment  Letter needs to be faxed to [phone] pt ID number [id number]   Please advise thanks

## 2019-07-18 NOTE — Telephone Encounter (Signed)
She has advanced cirrhosis of the liver and also advanced pulmonary fibrosis requiring oxygen.  She is on high risk medication therapy.  She requires close monitoring.  Therefore she not work.  She is essentially permanently disabled

## 2019-07-19 ENCOUNTER — Inpatient Hospital Stay: Payer: Medicaid Other | Admitting: Family Medicine

## 2019-07-19 ENCOUNTER — Ambulatory Visit (INDEPENDENT_AMBULATORY_CARE_PROVIDER_SITE_OTHER): Payer: Medicaid Other | Admitting: Family Medicine

## 2019-07-19 ENCOUNTER — Encounter: Payer: Self-pay | Admitting: *Deleted

## 2019-07-19 ENCOUNTER — Encounter: Payer: Self-pay | Admitting: Family Medicine

## 2019-07-19 ENCOUNTER — Other Ambulatory Visit: Payer: Self-pay

## 2019-07-19 VITALS — BP 110/60 | HR 86 | Wt 246.6 lb

## 2019-07-19 DIAGNOSIS — J849 Interstitial pulmonary disease, unspecified: Secondary | ICD-10-CM

## 2019-07-19 DIAGNOSIS — E119 Type 2 diabetes mellitus without complications: Secondary | ICD-10-CM

## 2019-07-19 DIAGNOSIS — E039 Hypothyroidism, unspecified: Secondary | ICD-10-CM | POA: Diagnosis present

## 2019-07-19 DIAGNOSIS — J309 Allergic rhinitis, unspecified: Secondary | ICD-10-CM

## 2019-07-19 MED ORDER — CETIRIZINE HCL 10 MG PO TABS: 10.0000 mg | ORAL_TABLET | Freq: Every day | ORAL | 11 refills | Status: AC

## 2019-07-19 MED ORDER — TRUE METRIX BLOOD GLUCOSE TEST VI STRP: ORAL_STRIP | 12 refills | Status: DC

## 2019-07-19 MED ORDER — LEVOTHYROXINE SODIUM 125 MCG PO TABS: 125.0000 ug | ORAL_TABLET | Freq: Two times a day (BID) | ORAL | 3 refills | Status: AC

## 2019-07-19 MED FILL — LEVOTHYROXINE SODIUM 125 MC: 125 | 90 days supply | Qty: 180 | Fill #0

## 2019-07-19 NOTE — Assessment & Plan Note (Signed)
TSH in appropriate range.  Will continue with patient's chronic Synthroid of 125 mcg twice daily.  Refill sent.

## 2019-07-19 NOTE — Progress Notes (Signed)
    SUBJECTIVE:   CHIEF COMPLAINT / HPI:   1 ED follow up  Patient seen in ED on 3/4 with 3-4 days with SOB with associated cough, chest tightness, and wheezing.  Was requiring 3L O2 at that time.  CXR was without acute abnormality. CT chest suggestive of UIP as seen back in January 2021. New streaky/hazy airspace opacities in posterior lower lungs possibly d.t atelectasis or early infectious etiology. Discharged with prednisone 58m x 5 days and Doxycycline course.  Has finished pred, but has a few days left of antibiotic.  Reports CBGs have been fine on steroids.  Seen by pulmonology on 3/8, was on 3 L O2 at the time.  Was advised to complete prednisone and doxycycline as prescribed by ED.  They are planning for PFTs in April.  She has scheduled for an appointment in April as well with Dr. RChase Caller Patient reports she is on 2L while at rest, and is going up to 3L as needed, which is usually when she needs to be walking.  Requesting a refill today for her Zyrtec as she states that this helps with her allergies and runny nose.  2 Hypothyroidism TSH 1.17 on 2/23.  Has been on 125 mcg twice daily for quite some time and well controlled on this.  Patient requesting refill today.  Health Maintenance: due for mammogram but wants to hold off given other medical issues   PERTINENT  PMH / PSH: Idiopathic pulmonary fibrosis, primary biliary cholangitis, COPD, T2DM, cirrhosis with esophageal varices, hypothyroidism, chronic pancytopenia  OBJECTIVE:   BP 110/60   Pulse 86   Wt 246 lb 9.6 oz (111.9 kg)   LMP  (LMP Unknown)   SpO2 95%   BMI 39.80 kg/m    Physical Exam:  General: 54y.o. female in NAD Cardio: RRR Lungs: Decent air movement throughout all lung fields to bases, bibasilar Velcro crackles on 2 L per [place] Skin: warm and dry   ASSESSMENT/PLAN:   ILD (interstitial lung disease) ([place] Seems to be doing well from a pulmonology perspective at this time.  Discussed with patient that  she looks better now than she has in quite some time since I had seen her.  She agrees.  We will continue to follow with pulmonology, has appointment scheduled in April for PFTs and follow-up with Dr. RChase Caller  Continue with her current inhalers.  Refilled Zyrtec.  Given her rather severe chronic medical conditions, would like to see her in 1 month to continue to follow and attempt to keep this patient at a stable respiratory status and keyed in with her appropriate specialist.  Hypothyroidism TSH in appropriate range.  Will continue with patient's chronic Synthroid of 125 mcg twice daily.  Refill sent.   Discussed health maintenance with the patient, but she would like to hold off on these things at this time given her other chronic medical conditions.  Agree with this decision at this time.  BCleophas Dunker DBangor

## 2019-07-19 NOTE — Assessment & Plan Note (Addendum)
Seems to be doing well from a pulmonology perspective at this time.  Discussed with patient that she looks better now than she has in quite some time since I had seen her.  She agrees.  We will continue to follow with pulmonology, has appointment scheduled in April for PFTs and follow-up with Dr. Chase Caller.  Continue with her current inhalers.  Refilled Zyrtec.  Given her rather severe chronic medical conditions, would like to see her in 1 month to continue to follow and attempt to keep this patient at a stable respiratory status and keyed in with her appropriate specialist.

## 2019-07-19 NOTE — Patient Instructions (Addendum)
Thank you for coming to see me today. It was a pleasure. Today we talked about:   Your breathing:  I'm glad you are doing okay.  You should continue to take the antibiotic and finish the course.    Follow up with your lung doctor and GI doctor as scheduled.  [phone] this is the number for the physical therapist.  Call them to scheduled an appointment.  Please follow-up with me in 1 months.  If you have any questions or concerns, please do not hesitate to call the office at [phone].  Best,   [place] Constable, DO

## 2019-07-20 ENCOUNTER — Encounter: Payer: Self-pay | Admitting: *Deleted

## 2019-07-20 ENCOUNTER — Telehealth: Payer: Self-pay

## 2019-07-20 MED ORDER — ACCU-CHEK SOFTCLIX LANCETS MISC: 3 refills | Status: AC

## 2019-07-20 MED ORDER — ACCU-CHEK GUIDE W/DEVICE KIT: 1.0000 | PACK | 0 refills | Status: AC

## 2019-07-20 MED ORDER — ACCU-CHEK GUIDE VI STRP: ORAL_STRIP | 12 refills | Status: AC

## 2019-07-20 MED ORDER — ACCU-CHEK SOFTCLIX LANCET DEV KIT: PACK | 0 refills | Status: AC

## 2019-07-20 MED FILL — ACCU-CHEK GUIDE W/DEVICE KI: W/DEVICE | 30 days supply | Qty: 1 | Fill #0

## 2019-07-20 MED FILL — ACCU-CHEK SOFTCLIX LANCETS: 25 days supply | Qty: 100 | Fill #0

## 2019-07-20 MED FILL — ACCU-CHEK GUIDE TEST STRIP: 25 days supply | Qty: 100 | Fill #0

## 2019-07-20 NOTE — Telephone Encounter (Signed)
I have done her letter and faxed to the number that she gave [place] with her ID number included in the letter  [facility] to let her know

## 2019-07-20 NOTE — Telephone Encounter (Signed)
Pt called back-- please return call.

## 2019-07-20 NOTE — Addendum Note (Signed)
Addended by: Collier Salina on: 07/20/2019 12:51 PM   Modules accepted: Orders

## 2019-07-20 NOTE — Telephone Encounter (Signed)
Spoke with the pt and notified that the letter was faxed. Nothing further needed.

## 2019-07-20 NOTE — Telephone Encounter (Signed)
Patient calls nurse line stating that insurance does not cover test strips for glucometer. New supplies sent in that medicaid covers.   Talbot Grumbling, RN

## 2019-07-21 ENCOUNTER — Ambulatory Visit: Payer: Medicaid Other

## 2019-07-21 ENCOUNTER — Other Ambulatory Visit: Payer: Self-pay

## 2019-07-21 ENCOUNTER — Ambulatory Visit: Payer: Self-pay

## 2019-07-21 NOTE — Chronic Care Management (AMB) (Signed)
  Care Management   Outreach Note  07/21/2019 Name: Jull Harral MRN: [id number] DOB: 1965-08-23  Referred by: Cleophas Dunker, DO Reason for referral : Care Coordination (Care Management F/U [facility])   An unsuccessful telephone outreach was attempted today. The patient was referred to the case management team for assistance with care management and care coordination.   Follow Up Plan: A HIPPA compliant phone message was left for the patient providing contact information and requesting a return call.  The care management team will reach out to the patient again over the next 5-7 days.   Lazaro Arms RN, BSN, [facility] Care Management Coordinator [place] Phone: [phone] Fax: [phone]

## 2019-07-25 ENCOUNTER — Telehealth: Payer: Self-pay | Admitting: Primary Care

## 2019-07-25 NOTE — Telephone Encounter (Signed)
Spoke with Holly Mueller" and she is agreeing with moving forward in the process of starting application for Esbriet. She is aware there is risk d/t hx liver cirrhosis and that she will need to have LFTs checked every two weeks x 6 months. She will not start medication until she speaks again with our office. Will likely need patient assistance of some kind. Prescription service form has been started for Esbriet 235m - take two tabs three times a day   Standing lab order placed in char for liver function panel q 2 weeks

## 2019-07-25 NOTE — Telephone Encounter (Signed)
Form has been completed. Will fax to [facility] this afternoon.

## 2019-07-27 NOTE — Patient Instructions (Signed)
Visit Information  Goals Addressed            This Visit's Progress   . " My diagnosis scares me" (pt-stated)       Current Barriers:  . Chronic Disease Management support,  care coordination needs related to  ILD  Clinical Goal(s) related to  ILD :  Over the next **14* days, patient will:  . Work with the care management team to address care coordination needs  . Call care management team with questions or concerns . Verbalize basic understanding of patient centered plan of care established today  Interventions related to  ILD :  . Evaluation of current treatment plans and patient's adherence to plan as established by provider . Assessed patient understanding of disease states . Assessed patient's education and care coordination needs . Collaborated with appropriate clinical care team members regarding patient needs . Contact Palliative Care to see when they are planning their initial call.  . 06/14/19 . Spoke with the patient and she states that she has talked with palliative care.  The y discussed her condition, what they do and about Hospice when she needs it.  She states they told her they will follow back up with her in March. . The patient denies need for any help with transportation, an in home aid, a cane or walker ( she states her brother has a walker if she needs it). . The patient states that she is on oxygen with [employer] and on the waiting list for a small concentrator.  She currently has the small portable tanks that she can carry in a should pack but feels they are to heavy.  I spoke with Darlina Guys today with [employer] and she states the small concentrators are on back order due to covid and they are not sure when they will have them. .  Patient states that for about 2 weeks she has been having problems with her BM's.  She states that they have been hard and she has seen some red blood when she wipes.  She has tried stool softeners and drinking plenty of water and states  nothing has helped.  Also,  she states her left leg gives her trouble at night.  It does not hurt if she walks or sits only when she lays down.  She rates the pain 8/10.  She will rub it with the diclofenac 1% gel but it does not relieve it.  She wants to see if she can get something else for her leg. She has an office visit scheduled 06/16/19@ 230.  She was asked Covid questions and responded no to all. o 07/21/19  o Patient states that she was see by the pcp on 3/10 she had been in the ED for Shob and cough. She was on 3 liters of oxygen.  She was then seen at pulmonary on 3/8.  She had been taking prednisone and doxycycline given in the ED.  She had just finished the prednisone on the 3/10.  She is still taking the doxycycline. o She states that her blood sugars are high this morning it was 200 and as were talking on the phone she checked, and it was 225.  She said that she had a slight headache and her legs felt heavy.  Her breathing was fine. She said any time she takes prednisone it usually takes a week for it to get out of her system. o She is taking all her medications.  Drinking water. Using her oxygen,  she does have a problem with it causing her nose to bleed.  I asked does she have humidified air she didn't know.  I will call [employer] to check on it and how the order needs to be written.  She also wants to see about getting a Handicap Placard and investigate a medical alert bracelet.   Patient Self Care Activities related to  ILD :  . Patient is unable to independently self-manage chronic health conditions  Please see past updates related to this goal by clicking on the "Past Updates" button in the selected goal         Ms. Mordan was given information about Care Management services today including:  1. Care Management services include personalized support from designated clinical staff supervised by her physician, including individualized plan of care and coordination with other care  providers 2. 24/7 contact phone numbers for assistance for urgent and routine care needs. 3. The patient may stop CCM services at any time (effective at the end of the month) by phone call to the office staff.  Patient agreed to services and verbal consent obtained.   The patient verbalized understanding of instructions provided today and declined a print copy of patient instruction materials.   The care management team will reach out to the patient again over the next 14 days.  The patient has been provided with contact information for the care management team and has been advised to call with any health related questions or concerns.   Lazaro Arms RN, BSN, [facility] Care Management Coordinator [place] Phone: [phone] Fax: [phone]

## 2019-07-27 NOTE — Chronic Care Management (AMB) (Signed)
Care Management   Follow Up Note   07/27/2019 Name: Holly Mueller MRN: [id number] DOB: [birth year]  Referred by: Cleophas Dunker, DO Reason for referral : Care Coordination (Care Management RNCM ILD)   Holly Mueller is a 54 y.o. year old female who is a primary care patient of Cleophas Dunker, DO. The care management team was consulted for assistance with care management and care coordination needs.    Review of patient status, including review of consultants reports, relevant laboratory and other test results, and collaboration with appropriate care team members and the patient's provider was performed as part of comprehensive patient evaluation and provision of chronic care management services.    SDOH (Social Determinants of Health) assessments performed: No See Care Plan activities for detailed interventions related to [facility])     Advanced Directives: See Care Plan and Vynca application for related entries.   Goals Addressed            This Visit's Progress   . " My diagnosis scares me" (pt-stated)       Current Barriers:  . Chronic Disease Management support,  care coordination needs related to  ILD  Clinical Goal(s) related to  ILD :  Over the next **14* days, patient will:  . Work with the care management team to address care coordination needs  . Call care management team with questions or concerns . Verbalize basic understanding of patient centered plan of care established today  Interventions related to  ILD :  . Evaluation of current treatment plans and patient's adherence to plan as established by provider . Assessed patient understanding of disease states . Assessed patient's education and care coordination needs . Collaborated with appropriate clinical care team members regarding patient needs . Contact Palliative Care to see when they are planning their initial call.  . 06/14/19 . Spoke with the patient and she states that she has talked  with palliative care.  The y discussed her condition, what they do and about Hospice when she needs it.  She states they told her they will follow back up with her in March. . The patient denies need for any help with transportation, an in home aid, a cane or walker ( she states her brother has a walker if she needs it). . The patient states that she is on oxygen with [employer] and on the waiting list for a small concentrator.  She currently has the small portable tanks that she can carry in a should pack but feels they are to heavy.  I spoke with Darlina Guys today with [employer] and she states the small concentrators are on back order due to covid and they are not sure when they will have them. .  Patient states that for about 2 weeks she has been having problems with her BM's.  She states that they have been hard and she has seen some red blood when she wipes.  She has tried stool softeners and drinking plenty of water and states nothing has helped.  Also,  she states her left leg gives her trouble at night.  It does not hurt if she walks or sits only when she lays down.  She rates the pain 8/10.  She will rub it with the diclofenac 1% gel but it does not relieve it.  She wants to see if she can get something else for her leg. She has an office visit scheduled 06/16/19@ 230.  She was asked Covid questions and responded  no to all. o 07/21/19  o Patient states that she was see by the pcp on 3/10 she had been in the ED for Shob and cough. She was on 3 liters of oxygen.  She was then seen at pulmonary on 3/8.  She had been taking prednisone and doxycycline given in the ED.  She had just finished the prednisone on the 3/10.  She is still taking the doxycycline. o She states that her blood sugars are high this morning it was 200 and as were talking on the phone she checked, and it was 225.  She said that she had a slight headache and her legs felt heavy.  Her breathing was fine. She said any time she takes prednisone it  usually takes a week for it to get out of her system. o She is taking all her medications.  Drinking water. Using her oxygen, she does have a problem with it causing her nose to bleed.  I asked does she have humidified air she didn't know.  I will call [employer] to check on it and how the order needs to be written.  She also wants to see about getting a Handicap Placard and investigate a medical alert bracelet.   Patient Self Care Activities related to  ILD :  . Patient is unable to independently self-manage chronic health conditions  Please see past updates related to this goal by clicking on the "Past Updates" button in the selected goal          The care management team will reach out to the patient again over the next 14 days.  The patient has been provided with contact information for the care management team and has been advised to call with any health related questions or concerns.   Lazaro Arms RN, BSN, [facility] Care Management Coordinator [place] Phone: [phone] Fax: [phone]

## 2019-07-28 ENCOUNTER — Other Ambulatory Visit: Payer: Self-pay | Admitting: Family Medicine

## 2019-07-28 DIAGNOSIS — R0609 Other forms of dyspnea: Secondary | ICD-10-CM

## 2019-07-28 DIAGNOSIS — J849 Interstitial pulmonary disease, unspecified: Secondary | ICD-10-CM

## 2019-07-31 ENCOUNTER — Ambulatory Visit: Payer: Medicaid Other

## 2019-08-01 ENCOUNTER — Ambulatory Visit: Payer: Self-pay

## 2019-08-01 NOTE — Patient Instructions (Signed)
Visit Information  Goals Addressed            This Visit's Progress   . I would like to get a hadicap placard (pt-stated)       CARE PLAN ENTRY (see longtitudinal plan of care for additional care plan information)  Current Barriers:  . Chronic Disease Management support and education needs related to DME needs in a patient with RLD and cirrhosis of the liver.  Nurse Case Manager Clinical Goal(s):  Marland Kitchen Over the next 30 days, patient will verbalize understanding of plan  Interventions:  . Evaluation of current treatment plan  and patient's adherence to plan as established by provider. Holly Mueller with [employer] regarding Humidified Air.  The PCP put in an order.  They have processed the order.  The patient called [employer] today and the humidifier will be mailed to her. . Discussed plans with patient for ongoing care management follow up and provided patient with direct contact information for care management team . Let the patient know that I plan to check on a Rollator for her  and also a hadi cap placard for her to fill out and bring back to the office. . She also wants me to see If I can find about about a low cost medical alert bractlet.  Patient Self Care Activities:  . Patient verbalizes understanding of plan  . Self administers medications as prescribed . Attends all scheduled provider appointments . Calls pharmacy for medication refills . Calls provider office for new concerns or questions . Unable to independently self manage DME services  Initial goal documentation        Holly Mueller was given information about Care Management services today including:  1. Care Management services include personalized support from designated clinical staff supervised by her physician, including individualized plan of care and coordination with other care providers 2. 24/7 contact phone numbers for assistance for urgent and routine care needs. 3. The patient may stop CCM services at any time  (effective at the end of the month) by phone call to the office staff.  Patient agreed to services and verbal consent obtained.   The patient verbalized understanding of instructions provided today and declined a print copy of patient instruction materials.   The care management team will reach out to the patient again over the next 5 days.  The patient has been provided with contact information for the care management team and has been advised to call with any health related questions or concerns.   Lazaro Arms RN, BSN, [facility] Care Management Coordinator [place] Phone: [phone] Fax: [phone]

## 2019-08-01 NOTE — Chronic Care Management (AMB) (Signed)
  Care Management   Follow Up Note   08/01/2019 Name: Holly Mueller MRN: [id number] DOB: 1966-02-13  Referred by: Cleophas Dunker, DO Reason for referral : Care Coordination (Care Management RNCM F/U DME)   Holly Mueller is a 54 y.o. year old female who is a primary care patient of Cleophas Dunker, DO. The care management team was consulted for assistance with care management and care coordination needs.    Review of patient status, including review of consultants reports, relevant laboratory and other test results, and collaboration with appropriate care team members and the patient's provider was performed as part of comprehensive patient evaluation and provision of chronic care management services.    SDOH (Social Determinants of Health) assessments performed: No See Care Plan activities for detailed interventions related to [facility])     Advanced Directives: See Care Plan and Vynca application for related entries.   Goals Addressed            This Visit's Progress   . I would like to get a hadicap placard (pt-stated)       CARE PLAN ENTRY (see longtitudinal plan of care for additional care plan information)  Current Barriers:  . Chronic Disease Management support and education needs related to DME needs in a patient with RLD and cirrhosis of the liver.  Nurse Case Manager Clinical Goal(s):  Marland Kitchen Over the next 30 days, patient will verbalize understanding of plan  Interventions:  . Evaluation of current treatment plan  and patient's adherence to plan as established by provider. [name] with [employer] regarding Humidified Air.  The PCP put in an order.  They have processed the order.  The patient called [employer] today and the humidifier will be mailed to her. . Discussed plans with patient for ongoing care management follow up and provided patient with direct contact information for care management team . Let the patient know that I plan to check on a  Rollator for her  and also a hadi cap placard for her to fill out and bring back to the office. . She also wants me to see If I can find about about a low cost medical alert bractlet.  Patient Self Care Activities:  . Patient verbalizes understanding of plan  . Self administers medications as prescribed . Attends all scheduled provider appointments . Calls pharmacy for medication refills . Calls provider office for new concerns or questions . Unable to independently self manage DME services  Initial goal documentation         The care management team will reach out to the patient again over the next 5 days.  The patient has been provided with contact information for the care management team and has been advised to call with any health related questions or concerns.   Lazaro Arms RN, BSN, [facility] Care Management Coordinator [place] Phone: [phone] Fax: [phone]

## 2019-08-03 ENCOUNTER — Telehealth: Payer: Self-pay | Admitting: Hospice

## 2019-08-03 NOTE — Telephone Encounter (Signed)
Have called patient several times and left her voice mails with call back number. Spoke with her son Roanna Epley who affirmed patient does not like to pick up her phone. Gave him my phone number so patient can call me today or at her earliest convenience

## 2019-08-07 ENCOUNTER — Ambulatory Visit: Payer: Medicaid Other

## 2019-08-07 ENCOUNTER — Other Ambulatory Visit: Payer: Self-pay

## 2019-08-07 ENCOUNTER — Other Ambulatory Visit: Payer: Self-pay | Admitting: Family Medicine

## 2019-08-07 ENCOUNTER — Telehealth: Payer: Self-pay | Admitting: Internal Medicine

## 2019-08-07 ENCOUNTER — Ambulatory Visit: Payer: Self-pay

## 2019-08-07 DIAGNOSIS — J849 Interstitial pulmonary disease, unspecified: Secondary | ICD-10-CM

## 2019-08-07 NOTE — Telephone Encounter (Signed)
LMTCB x1

## 2019-08-07 NOTE — Chronic Care Management (AMB) (Signed)
  Care Management   Outreach Note  08/07/2019 Name: Holly Mueller MRN: [id number] DOB: [birth year]  Referred by: Cleophas Dunker, DO Reason for referral : Care Coordination (Care Management RNCM papers)   An unsuccessful telephone outreach was attempted today. The patient was referred to the case management team for assistance with care management and care coordination.   Follow Up Plan: A HIPPA compliant phone message was left for the patient providing contact information and requesting a return call.  The care management team will reach out to the patient again over the next 5-7 days.   Lazaro Arms RN, BSN, [facility] Care Management Coordinator [place] Phone: [phone] Fax: [phone]

## 2019-08-07 NOTE — Telephone Encounter (Signed)
I called and spoke with the patient and made her aware that the form for Esbriet has been faxed to [facility]. She verbalized understanding.

## 2019-08-07 NOTE — Telephone Encounter (Signed)
Pt returning call.  [phone]

## 2019-08-08 ENCOUNTER — Other Ambulatory Visit: Payer: Self-pay

## 2019-08-08 ENCOUNTER — Telehealth: Payer: Medicaid Other

## 2019-08-08 NOTE — Chronic Care Management (AMB) (Signed)
Care Management   Follow Up Note   08/08/2019 Name: Holly Mueller MRN: [id number] DOB: 1965-11-20  Referred by: Cleophas Dunker, DO Reason for referral : Care Coordination (Care Management RNCM [place] work)   Holly Mueller is a 54 y.o. year old female who is a primary care patient of Cleophas Dunker, DO. The care management team was consulted for assistance with care management and care coordination needs.    Review of patient status, including review of consultants reports, relevant laboratory and other test results, and collaboration with appropriate care team members and the patient's provider was performed as part of comprehensive patient evaluation and provision of chronic care management services.    SDOH (Social Determinants of Health) assessments performed: No See Care Plan activities for detailed interventions related to [facility])     Advanced Directives: See Care Plan and Vynca application for related entries.   Goals Addressed            This Visit's Progress   . I would like to get a hadicap placard (pt-stated)       CARE PLAN ENTRY (see longtitudinal plan of care for additional care plan information)  Current Barriers:  . Chronic Disease Management support and education needs related to DME needs in a patient with RLD and cirrhosis of the liver.  Nurse Case Manager Clinical Goal(s):  Marland Kitchen Over the next 30 days, patient will verbalize understanding of plan  Interventions:  . Evaluation of current treatment plan  and patient's adherence to plan as established by provider. [name] with [employer] regarding Humidified Air.  The PCP put in an order.  They have processed the order.  The patient called [employer] today and the humidifier will be mailed to her. . Discussed plans with patient for ongoing care management follow up and provided patient with direct contact information for care management team . Let the patient know that I plan to check on a  Rollator for her  and also a hadi cap placard for her to fill out and bring back to the office. . She also wants me to see If I can find about about a low cost medical alert bractlet. . 08/07/19 . Spoke with the patient and told her that I have sent a message to her provider for an order for a rollator and will send it to [employer]. . I have put the  form for Handicap placard at the front office for her to get fill out and sign . She states that she has received the humidified air for her oxygen and it has helped with the nose bleeds.  They have cut down. . I am still in the process of looking for a low cost medical alert bracelet.   Patient Self Care Activities:  . Patient verbalizes understanding of plan  . Self administers medications as prescribed . Attends all scheduled provider appointments . Calls pharmacy for medication refills . Calls provider office for new concerns or questions . Unable to independently self manage DME services  Please see past updates related to this goal by clicking on the "Past Updates" button in the selected goal          The care management team will reach out to the patient again over the next 14 days.  The patient has been provided with contact information for the care management team and has been advised to call with any health related questions or concerns.   Lazaro Arms RN, BSN, [facility] Care  Management Coordinator [place] Phone: [phone] Fax: [phone]

## 2019-08-08 NOTE — Patient Instructions (Signed)
Visit Information  Goals Addressed            This Visit's Progress   . I would like to get a hadicap placard (pt-stated)       CARE PLAN ENTRY (see longtitudinal plan of care for additional care plan information)  Current Barriers:  . Chronic Disease Management support and education needs related to DME needs in a patient with RLD and cirrhosis of the liver.  Nurse Case Manager Clinical Goal(s):  Marland Kitchen Over the next 30 days, patient will verbalize understanding of plan  Interventions:  . Evaluation of current treatment plan  and patient's adherence to plan as established by provider. Holly Mueller with [employer] regarding Humidified Air.  The PCP put in an order.  They have processed the order.  The patient called [employer] today and the humidifier will be mailed to her. . Discussed plans with patient for ongoing care management follow up and provided patient with direct contact information for care management team . Let the patient know that I plan to check on a Rollator for her  and also a hadi cap placard for her to fill out and bring back to the office. . She also wants me to see If I can find about about a low cost medical alert bractlet. . 08/07/19 . Spoke with the patient and told her that I have sent a message to her provider for an order for a rollator and will send it to [employer]. . I have put the  form for Handicap placard at the front office for her to get fill out and sign . She states that she has received the humidified air for her oxygen and it has helped with the nose bleeds.  They have cut down. . I am still in the process of looking for a low cost medical alert bracelet.   Patient Self Care Activities:  . Patient verbalizes understanding of plan  . Self administers medications as prescribed . Attends all scheduled provider appointments . Calls pharmacy for medication refills . Calls provider office for new concerns or questions . Unable to independently self manage DME  services  Please see past updates related to this goal by clicking on the "Past Updates" button in the selected goal         Holly Mueller was given information about Care Management services today including:  1. Care Management services include personalized support from designated clinical staff supervised by her physician, including individualized plan of care and coordination with other care providers 2. 24/7 contact phone numbers for assistance for urgent and routine care needs. 3. The patient may stop CCM services at any time (effective at the end of the month) by phone call to the office staff.  Patient agreed to services and verbal consent obtained.   The patient verbalized understanding of instructions provided today and declined a print copy of patient instruction materials.   The care management team will reach out to the patient again over the next 14 days.  The patient has been provided with contact information for the care management team and has been advised to call with any health related questions or concerns.   Lazaro Arms RN, BSN, [facility] Care Management Coordinator [place] Phone: [phone] Fax: [phone]

## 2019-08-09 ENCOUNTER — Ambulatory Visit: Payer: Medicaid Other | Admitting: Physician Assistant

## 2019-08-09 ENCOUNTER — Encounter: Payer: Self-pay | Admitting: Physician Assistant

## 2019-08-09 ENCOUNTER — Other Ambulatory Visit: Payer: Self-pay

## 2019-08-09 VITALS — BP 114/68 | HR 88 | Ht 66.0 in | Wt 246.8 lb

## 2019-08-09 DIAGNOSIS — R0789 Other chest pain: Secondary | ICD-10-CM | POA: Diagnosis not present

## 2019-08-09 DIAGNOSIS — K743 Primary biliary cirrhosis: Secondary | ICD-10-CM | POA: Diagnosis not present

## 2019-08-09 DIAGNOSIS — J8410 Pulmonary fibrosis, unspecified: Secondary | ICD-10-CM

## 2019-08-09 DIAGNOSIS — E119 Type 2 diabetes mellitus without complications: Secondary | ICD-10-CM

## 2019-08-09 DIAGNOSIS — R011 Cardiac murmur, unspecified: Secondary | ICD-10-CM

## 2019-08-09 NOTE — Progress Notes (Signed)
Cardiology Office Note:    Date:  08/11/2019   ID:  [name], DOB [birth year], MRN [id number]  PCP:  Cleophas Dunker, DO  Cardiologist:  Sanda Klein, MD  Electrophysiologist:  None   Referring MD: Cleophas Dunker, *   Chief Complaint  Patient presents with  . Follow-up    seen for Dr. Sallyanne Kuster    History of Present Illness:    Holly Mueller is a 54 y.o. 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  Patient previously was admitted to [facility] in May 2019 with chest pain and had a negative Myoview on 09/19/2017.  She was last seen by Kerin Ransom, PA-C on 10/14/2017 as part of hospital follow-up at which time she continued to have atypical chest pain.  Last echocardiogram obtained on 09/24/2018 demonstrated EF 60 to 65%, normal RV EF, mild to moderate mitral valvular calcification and mild calcification of the aortic valve.  Patient has been admitted multiple times during the past 6 months.  She was in the hospital in January 2021 due to acute on chronic hypoxic respiratory failure.  There was some concern for pneumonia and she was treated with dexamethasone and antibiotic.  Covid test was negative.  She was seen by pulmonology service who felt she may have idiopathic interstitial lung disease with autoimmune features.  She was eventually discharged on home oxygen.  Patient returned in February with recurrent cough, dyspnea and orthopnea due to acute exacerbation of IPF.  She was treated with another course of prednisone and antibiotic.  She was most recently seen in the ED again on 07/14/2019 due to recurrent dyspnea.  Patient presents today for cardiology office visit.  She is still on 2 L oxygen 24/7.  She continued to have left-sided pain and needle sensation every once in a while.  It just does not correlate with physical activity and it has been  going on for years.  The chest discomfort is clearly noncardiac.  On physical exam, she has 2 out of 6 systolic murmur near the mitral valve area.  Previous  echocardiogram in October 2020 demonstrated mitral valve annular calcification however did not mention any mitral regurgitation.  I do not think she has severe valve issue to explain her symptom at this point.  Majority of her issue is still pulmonary in nature.  At this time, I do not recommend any cardiac work-up.  She does not appears to be volume overloaded.  She will continue to follow-up with pulmonology and oncology service.   Past Medical History:  Diagnosis Date  . Acute respiratory failure with hypoxia ([place]) 09/22/2018  . Anxiety    "anxiety attacks- sometimes"  . Arthritis    bursitis left hip flares-not an issue  . Chronic kidney disease   . Community acquired pneumonia 11/28/2018  . Diabetes mellitus without complication ([place])    Type II  . Dysrhythmia   . Edentulous    10-19-13 at present  . Epilepsy ([place]) in 1972   No seizures since 1972. Previously treated with phenobarbital.   . Gastric varices with bleeding   . GERD (gastroesophageal reflux disease) 2008  . 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 Headache(784.0)    hx of migraines   . Heart murmur   . 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  History of blood transfusion    gi bleed  . History of kidney stones    passed  . Hypothyroidism 2008  . Interstitial lung disease ([place])   . Leukopenia   . Lower esophageal ring 08/18/2013  . Pneumonia   . Primary biliary cholangitis ([place]) 10/24/2018  . Seasonal allergies 2003  . SEIZURES, HX OF 12/12/2009   Annotation: last seizure in early 1970s Qualifier: Diagnosis of  By: Carlena Sax  MD, Colletta [place]    . Shortness of breath    walking  . Wears glasses     Past Surgical History:  Procedure Laterality Date  . ABDOMINAL HYSTERECTOMY     " partial "   . BALLOON DILATION N/A 10/24/2013   Procedure: BALLOON DILATION;  Surgeon: Gatha Mayer, MD;  Location: [facility];  Service: Endoscopy;  Laterality: N/A;  . BIOPSY  09/26/2018   Procedure: BIOPSY;  Surgeon: Thornton Park, MD;  Location: [place];  Service: Gastroenterology;;  . BIOPSY  01/09/2019   Procedure: BIOPSY;  Surgeon: Irving Copas., MD;  Location: [place];  Service: Gastroenterology;;  . CESAREAN SECTION     x2  . DENTAL SURGERY     multiple extractions 3'15  . DILATION AND CURETTAGE OF UTERUS    . ESOPHAGOGASTRODUODENOSCOPY N/A 10/24/2013   Procedure: ESOPHAGOGASTRODUODENOSCOPY (EGD);  Surgeon: Gatha Mayer, MD;  Location: [facility];  Service: Endoscopy;  Laterality: N/A;  . ESOPHAGOGASTRODUODENOSCOPY N/A 01/09/2019   Procedure: ESOPHAGOGASTRODUODENOSCOPY (EGD);  Surgeon: Irving Copas., MD;  Location: [place];  Service: Gastroenterology;  Laterality: N/A;  . ESOPHAGOGASTRODUODENOSCOPY (EGD) WITH PROPOFOL N/A 09/26/2018   Procedure: ESOPHAGOGASTRODUODENOSCOPY (EGD) WITH PROPOFOL;  Surgeon: Thornton Park, MD;  Location: [place];  Service: Gastroenterology;  Laterality: N/A;  . ESOPHAGOGASTRODUODENOSCOPY (EGD) WITH PROPOFOL N/A 04/26/2019   Procedure: ESOPHAGOGASTRODUODENOSCOPY (EGD) WITH PROPOFOL;  Surgeon: Rush Landmark Telford Nab., MD;  Location: [place];  Service: Gastroenterology;  Laterality: N/A;  . HEMOSTASIS CLIP PLACEMENT  04/26/2019   Procedure: HEMOSTASIS CLIP PLACEMENT;  Surgeon: Irving Copas., MD;  Location: [place];  Service: Gastroenterology;;  . IR PARACENTESIS  09/23/2018  . LIVER BIOPSY  06/30/2012   Procedure: LIVER BIOPSY;  Surgeon: Shann Medal, MD;  [facility];  Service: General;;  . NOVASURE ABLATION    . SCLEROTHERAPY  04/26/2019   Procedure: Clide Deutscher;  Surgeon: Mansouraty, Telford Nab., MD;  Location: [place];  Service: Gastroenterology;;  cyanoacrylate  . SHOULDER  ARTHROSCOPY Right 2011  . UMBILICAL HERNIA REPAIR N/A 06/30/2012   Procedure: remove umbilicus;  Surgeon: Shann Medal, MD;  [facility];  Service: General;  Laterality: N/A;  . UPPER ESOPHAGEAL ENDOSCOPIC ULTRASOUND (EUS) N/A 01/09/2019   Procedure: UPPER ESOPHAGEAL ENDOSCOPIC ULTRASOUND (EUS);  Surgeon: Irving Copas., MD;  Location: [place];  Service: Gastroenterology;  Laterality: N/A;  . VENTRAL HERNIA REPAIR N/A 06/30/2012   Procedure: LAPAROSCOPIC VENTRAL HERNIA;  Surgeon: Shann Medal, MD;  [facility];  Service: General;  Laterality: N/A;  With Mesh  . VIDEO BRONCHOSCOPY Bilateral 07/20/2018   Procedure: VIDEO BRONCHOSCOPY WITHOUT FLUORO;  Surgeon: Brand Males, MD;  [facility];  Service: Cardiopulmonary;  Laterality: Bilateral;  . WISDOM TOOTH EXTRACTION      Current Medications: Current Meds  Medication Sig  . Accu-Chek Softclix Lancets lancets Use with three times daily with meals and at night. E11.9  . Acetaminophen (APAP) 325 MG tablet Take 2 tablets (650 mg total) by mouth every 6 (six) hours as needed.  Marland Kitchen  albuterol (PROAIR HFA) 108 (90 Base) MCG/ACT inhaler INHALE 2 PUFFS INTO THE LUNGS EVERY 6 HOURS AS NEEDED FOR SHORTNESS OF BREATH (Patient taking differently: Inhale 2 puffs into the lungs every 4 (four) hours as needed for wheezing or shortness of breath. )  . albuterol (PROVENTIL) (2.5 MG/3ML) 0.083% nebulizer solution Take 3 mLs (2.5 mg total) by nebulization every 6 (six) hours as needed for wheezing or shortness of breath.  . AMBULATORY NON FORMULARY MEDICATION Nitroglycerin oint. 0.125% Apply a small pea size amount BID for 6-8 weeks  . blood glucose meter kit and supplies Dispense based on patient and insurance preference. Use up to four times daily as directed. (FOR ICD-10 E10.9, E11.9).  Marland Kitchen Blood Glucose Monitoring Suppl (ACCU-CHEK GUIDE) w/Device KIT 1 Device by Does not apply route as directed. Use with three times daily with  meals and at night. E11.9  . budesonide-formoterol (SYMBICORT) 80-4.5 MCG/ACT inhaler Inhale 2 puffs into the lungs 2 (two) times daily for 1 day.  . cetirizine (ZYRTEC) 10 MG tablet Take 1 tablet (10 mg total) by mouth daily.  Marland Kitchen dextromethorphan-guaiFENesin (MUCINEX DM) 30-600 MG 12hr tablet Take 1-2 tablets by mouth at bedtime as needed for cough.  . diclofenac Sodium (VOLTAREN) 1 % GEL Apply 4 g topically 4 (four) times daily as needed (pain).  Marland Kitchen doxycycline (VIBRAMYCIN) 100 MG capsule Take 1 capsule (100 mg total) by mouth 2 (two) times daily.  . fluticasone (FLONASE) 50 MCG/ACT nasal spray Place 2 sprays into both nostrils in the morning and at bedtime.   Marland Kitchen glucose blood (ACCU-CHEK GUIDE) test strip Use with three times daily with meals and at night. E11.9  . Lancets Misc. (ACCU-CHEK SOFTCLIX LANCET DEV) KIT Use with three times daily with meals and at night. E11.9  . levothyroxine (SYNTHROID) 125 MCG tablet Take 1 tablet (125 mcg total) by mouth 2 (two) times daily.  . metFORMIN (GLUCOPHAGE) 500 MG tablet Take 1 tablet (500 mg total) by mouth 2 (two) times daily with a meal.  . OXYGEN Inhale 2 L into the lungs continuous.   . pantoprazole (PROTONIX) 40 MG tablet Take 1 tablet (40 mg total) by mouth 2 (two) times daily before a meal.  . predniSONE (DELTASONE) 20 MG tablet Take 2 tablets (40 mg total) by mouth daily with breakfast.  . SPIRIVA HANDIHALER 18 MCG inhalation capsule INHALE 1 CAPSULE VIA HANDIHALER ONCE DAILY AT THE SAME TIME EVERY DAY     Allergies:   Aspirin   Social History   Socioeconomic History  . Marital status: Single    Spouse name: Not on file  . Number of children: 2  . Years of education: Not on file  . Highest education level: Not on file  Occupational History  . Occupation: unemployed  Tobacco Use  . Smoking status: Never Smoker  . Smokeless tobacco: Never Used  . Tobacco comment: 04/25/2019- "I smoke part of a cigarette and put it out,I did not like it."   Substance and Sexual Activity  . Alcohol use: Not Currently  . Drug use: No  . Sexual activity: Not Currently  Other Topics Concern  . 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 Difficulty of Paying Living Expenses: Very hard  Food Insecurity:   . Worried About Charity fundraiser in the Last Year:   . Arboriculturist in the Last Year:   Transportation Needs:   . Film/video editor (Medical):   Marland Kitchen Lack of Transportation (Non-Medical):   Physical Activity:   . Days of Exercise per Week:   . Minutes of Exercise per Session:   Stress:   . Feeling of Stress :   Social Connections:   . Frequency of Communication with Friends and Family:   . Frequency of Social Gatherings with Friends and Family:   . Attends Religious Services:   . Active Member of Clubs or Organizations:   . Attends Archivist Meetings:   Marland Kitchen Marital Status:      Family History: The patient's family history includes Cirrhosis in her mother; Diabetes in her mother and sister; Hypertension in her mother and sister; Lung cancer in her father; Stroke in her father. There is no history of Colon cancer, Esophageal cancer, or Rectal cancer.  ROS:   Please see the history of present illness.     All other systems reviewed and are negative.  EKGs/Labs/Other Studies Reviewed:    The following studies were reviewed today:  Echo 09/24/2018 IMPRESSIONS   1. The left ventricle has normal systolic function with an ejection  fraction of 60-65%. The cavity size was normal. Left ventricular diastolic  parameters were normal. No evidence of left ventricular regional wall  motion abnormalities.  2. The right ventricle has normal systolic function. The cavity was    mildly enlarged. There is no increase in right ventricular wall thickness.  3. There is mild to moderate mitral annular calcification present.  4. The aortic valve is tricuspid. Moderate thickening of the aortic  valve. Mild calcification of the aortic valve. Aortic valve regurgitation  was not assessed by color flow Doppler. Very mild stenosis of the aortic valve.    EKG:  EKG is not ordered today.   Recent Labs: 09/22/2018: Pro B Natriuretic peptide (BNP) 35.0 05/27/2019: Magnesium 1.9 06/26/2019: B Natriuretic Peptide 27.6 07/04/2019: TSH 1.170 07/13/2019: ALT 32; BUN 10; Creatinine, Ser 0.59; Hemoglobin 9.0; Platelets 51; Potassium 3.9; Sodium 139  Recent Lipid Panel    Component Value Date/Time   CHOL 144 09/11/2017 0630   TRIG 81 08/27/2018 2348   HDL 26 (L) 09/11/2017 0630   CHOLHDL 5.5 09/11/2017 0630   VLDL 21 09/11/2017 0630   LDLCALC 97 09/11/2017 0630   LDLDIRECT 121 (H) 08/26/2011 0957    Physical Exam:    VS:  BP 114/68   Pulse 88   Ht _0  (1.676 m)   Wt 246 lb 12.8 oz (111.9 kg)   LMP  (LMP Unknown)   SpO2 95%   BMI 39.83 kg/m     Wt Readings from Last 3 Encounters:  08/09/19 246 lb 12.8 oz (111.9 kg)  07/19/19 246 lb 9.6 oz (111.9 kg)  07/17/19 246 lb 12.8 oz (111.9 kg)     GEN:  Well nourished, well developed in no acute distress HEENT: Normal NECK: No JVD; No carotid bruits LYMPHATICS: No lymphadenopathy CARDIAC: RRR, no rubs, gallops. 2/6 systolic murmur at apex RESPIRATORY:  Clear to auscultation without rales, wheezing or rhonchi  ABDOMEN: Soft, non-tender, non-distended MUSCULOSKELETAL:  No edema; No deformity  SKIN: Warm and dry NEUROLOGIC:  Alert and oriented x 3 PSYCHIATRIC:  Normal affect   ASSESSMENT:  1. Atypical chest pain   2. Interstitial pulmonary fibrosis ([place])   3. Controlled type 2 diabetes mellitus without complication, without long-term current use of insulin ([place])   4. Hepatic cirrhosis due to primary biliary  cholangitis ([place])   5. Heart murmur    PLAN:    In order of problems listed above:  1.  Atypical chest pain: No further work-up at this time.  Symptom does not correlate with physical activity  2. Idiopathic interstitial lung disease: Multiple recent admission for pulmonary issue.  Followed by pulmonology service  3. DM2: Managed per primary care provider  4. Hepatic cirrhosis secondary to primary biliary cholangitis: Followed by primary care provider  5.  Murmur: Recent echocardiogram suggested mitral valve calcification however no obvious leakage or stenosis.   Medication Adjustments/Labs and Tests Ordered: Current medicines are reviewed at length with the patient today.  Concerns regarding medicines are outlined above.  No orders of the defined types were placed in this encounter.  No orders of the defined types were placed in this encounter.   Patient Instructions  Medication Instructions:  Your physician recommends that you continue on your current medications as directed. Please refer to the Current Medication list given to you today.  *If you need a refill on your cardiac medications before your next appointment, please call your pharmacy*  Lab Work: NONE ordered at this time of appointment   If you have labs (blood work) drawn today and your tests are completely normal, you will receive your results only by: Marland Kitchen MyChart Message (if you have MyChart) OR . A paper copy in the mail If you have any lab test that is abnormal or we need to change your treatment, we will call you to review the results.  Testing/Procedures: NONE ordered at this time of appointment   Follow-Up: At [facility], you and your health needs are our priority.  As part of our continuing mission to provide you with exceptional heart care, we have created designated Provider Care Teams.  These Care Teams include your primary Cardiologist (physician) and Advanced Practice Providers (APPs -  Physician  Assistants and Nurse Practitioners) who all work together to provide you with the care you need, when you need it.  We recommend signing up for the patient portal called "MyChart".  Sign up information is provided on this After Visit Summary.  MyChart is used to connect with patients for Virtual Visits (Telemedicine).  Patients are able to view lab/test results, encounter notes, upcoming appointments, etc.  Non-urgent messages can be sent to your provider as well.   To learn more about what you can do with MyChart, go to [email or web address].    Your next appointment:   8 month(s)  The format for your next appointment:   In Person  Provider:   Sanda Klein, MD  Other Instructions      Signed, Almyra Deforest, [place]  08/11/2019 11:01 PM    [place]

## 2019-08-09 NOTE — Patient Instructions (Signed)
Medication Instructions:  Your physician recommends that you continue on your current medications as directed. Please refer to the Current Medication list given to you today.  *If you need a refill on your cardiac medications before your next appointment, please call your pharmacy*  Lab Work: NONE ordered at this time of appointment   If you have labs (blood work) drawn today and your tests are completely normal, you will receive your results only by: Marland Kitchen MyChart Message (if you have MyChart) OR . A paper copy in the mail If you have any lab test that is abnormal or we need to change your treatment, we will call you to review the results.  Testing/Procedures: NONE ordered at this time of appointment   Follow-Up: At [facility], you and your health needs are our priority.  As part of our continuing mission to provide you with exceptional heart care, we have created designated Provider Care Teams.  These Care Teams include your primary Cardiologist (physician) and Advanced Practice Providers (APPs -  Physician Assistants and Nurse Practitioners) who all work together to provide you with the care you need, when you need it.  We recommend signing up for the patient portal called "MyChart".  Sign up information is provided on this After Visit Summary.  MyChart is used to connect with patients for Virtual Visits (Telemedicine).  Patients are able to view lab/test results, encounter notes, upcoming appointments, etc.  Non-urgent messages can be sent to your provider as well.   To learn more about what you can do with MyChart, go to [email or web address].    Your next appointment:   8 month(s)  The format for your next appointment:   In Person  Provider:   Sanda Klein, MD  Other Instructions

## 2019-08-10 ENCOUNTER — Telehealth: Payer: Medicaid Other

## 2019-08-11 ENCOUNTER — Encounter: Payer: Self-pay | Admitting: Physician Assistant

## 2019-08-17 ENCOUNTER — Encounter: Payer: Self-pay | Admitting: Family Medicine

## 2019-08-17 ENCOUNTER — Other Ambulatory Visit: Payer: Self-pay

## 2019-08-17 ENCOUNTER — Ambulatory Visit (INDEPENDENT_AMBULATORY_CARE_PROVIDER_SITE_OTHER): Payer: Medicaid Other | Admitting: Family Medicine

## 2019-08-17 VITALS — BP 108/54 | HR 88 | Ht 66.0 in | Wt 247.2 lb

## 2019-08-17 DIAGNOSIS — K743 Primary biliary cirrhosis: Secondary | ICD-10-CM | POA: Diagnosis not present

## 2019-08-17 DIAGNOSIS — B9689 Other specified bacterial agents as the cause of diseases classified elsewhere: Secondary | ICD-10-CM | POA: Diagnosis not present

## 2019-08-17 DIAGNOSIS — E119 Type 2 diabetes mellitus without complications: Secondary | ICD-10-CM

## 2019-08-17 DIAGNOSIS — J019 Acute sinusitis, unspecified: Secondary | ICD-10-CM

## 2019-08-17 DIAGNOSIS — J8410 Pulmonary fibrosis, unspecified: Secondary | ICD-10-CM | POA: Diagnosis not present

## 2019-08-17 MED ORDER — AMOXICILLIN-POT CLAVULANATE 875-125 MG PO TABS: 1.0000 | ORAL_TABLET | Freq: Two times a day (BID) | ORAL | 0 refills | Status: DC

## 2019-08-17 MED FILL — AMOX-CLAV 875-125 MG TABLET: 875-125 | 5 days supply | Qty: 10 | Fill #0

## 2019-08-17 NOTE — Assessment & Plan Note (Signed)
Due for follow-up with GI in August.  Patient aware of this.  Denies any abdominal pain.

## 2019-08-17 NOTE — Progress Notes (Signed)
    SUBJECTIVE:   CHIEF COMPLAINT / HPI:   Facial Pressure Started two days ago, in bed all day yesterday Tried OTC nasal decongestant without improvement Has also been taking tylenol without relief No fevers No changes with breathing Has been using flonase without improvement, also taking zyrtec Has had this before and states it feels like a sinus infection, states she gets it a few times a year  Interstitial lung disease Follows with Dr. Chase Caller Reports breathing has been pretty stable Has been using 2-3L O2 Has PFTs scheduled for 4/28 Has been using inhalers  Hepatic cirrhosis secondary to primary biliary cholangitis Follows with Dr. Carlean Purl, gastroenterologist  due for follow-up in August  T2DM last A1c 5.5 on 05/23/2019 Has been taking metformin and doing well, no complaints  Got her first COVID vaccination, due for second on 4/17   PERTINENT  PMH / PSH: Interstitial lung disease, hepatic cirrhosis secondary to primary biliary cholangitis, T2DM  OBJECTIVE:   BP (!) 108/54   Pulse 88   Ht _0  (1.676 m)   Wt 247 lb 3.2 oz (112.1 kg)   LMP  (LMP Unknown)   SpO2 95%   BMI 39.90 kg/m    Physical Exam:  General: 54 y.o. female in NAD HEENT: NCAT, erythematous and swollen nasal turbinates, tenderness to palpation of frontal and maxillary sinuses Cardio: RRR, murmur heard best at LUSB Lungs: Diffuse crackles, decent air movement on 2L per [place], no increased work of breathing Skin: warm and dry Extremities: No edema   ASSESSMENT/PLAN:   Acute bacterial sinusitis Patient with 2 days of sinus pressure.  No fevers.  Breathing seems stable.  Given patient's numerous comorbidities, would opt to treat sooner.  Will prescribe Augmentin for 5 days.  Could consider alternating allergy medications in the future, Allegra, Zyrtec, Claritin.  Also advised to continue Flonase.  Follow-up if no improvement.  Interstitial pulmonary fibrosis ([place]) Follows with Dr. Chase Caller.   Seems to be doing stable at this time.  Has an appointment for PFTs this month.  Reports that her inhalers are doing well and that she understands which ones to take.  No changes at this time.  Continue current management.  Primary biliary cholangitis ([place]) Due for follow-up with GI in August.  Patient aware of this.  Denies any abdominal pain.  Type 2 diabetes mellitus without complication, without long-term current use of insulin ([facility]) Last A1c on 05/23/2019 5.5.  Has been taking Metformin and tolerating this well.  Continue Metformin.  No changes at this time.  Repeat A1c at next visit.     Cleophas Dunker, [place]

## 2019-08-17 NOTE — Assessment & Plan Note (Signed)
Follows with Dr. Chase Caller.  Seems to be doing stable at this time.  Has an appointment for PFTs this month.  Reports that her inhalers are doing well and that she understands which ones to take.  No changes at this time.  Continue current management.

## 2019-08-17 NOTE — Assessment & Plan Note (Signed)
Last A1c on 05/23/2019 5.5.  Has been taking Metformin and tolerating this well.  Continue Metformin.  No changes at this time.  Repeat A1c at next visit.

## 2019-08-17 NOTE — Assessment & Plan Note (Signed)
Patient with 2 days of sinus pressure.  No fevers.  Breathing seems stable.  Given patient's numerous comorbidities, would opt to treat sooner.  Will prescribe Augmentin for 5 days.  Could consider alternating allergy medications in the future, Allegra, Zyrtec, Claritin.  Also advised to continue Flonase.  Follow-up if no improvement.

## 2019-08-17 NOTE — Patient Instructions (Signed)
Thank you for coming to see me today. It was a pleasure. Today we talked about:   I have sent an antibiotic to the pharmacy for your sinus infection.  Keep using your allergy medication and flonase.  Please follow-up with me in 2 months or sooner as needed.  If you have any questions or concerns, please do not hesitate to call the office at [phone].  Best,   [place] Constable, DO

## 2019-08-18 ENCOUNTER — Telehealth: Payer: Self-pay | Admitting: Family Medicine

## 2019-08-18 NOTE — Telephone Encounter (Signed)
Completed form for handicap placard left in inbox.  Called patient to inform of completion.  No answer, left VM advising that it was left at the front for her to pick up.  Arizona Constable, D.O.  PGY-2 Family Medicine  08/18/2019 11:44 AM

## 2019-08-21 ENCOUNTER — Telehealth: Payer: Self-pay | Admitting: Internal Medicine

## 2019-08-21 NOTE — Telephone Encounter (Signed)
Will route to myself to follow up on.

## 2019-08-22 NOTE — Telephone Encounter (Signed)
Form has been faxed to patient assistance program for pt. Nothing further needed.

## 2019-08-23 ENCOUNTER — Encounter: Payer: Self-pay | Admitting: Physical Therapy

## 2019-08-23 ENCOUNTER — Telehealth: Payer: Self-pay | Admitting: Internal Medicine

## 2019-08-23 ENCOUNTER — Other Ambulatory Visit: Payer: Self-pay

## 2019-08-23 ENCOUNTER — Ambulatory Visit: Payer: Medicaid Other | Attending: Family Medicine | Admitting: Physical Therapy

## 2019-08-23 VITALS — BP 125/65 | HR 89

## 2019-08-23 DIAGNOSIS — R293 Abnormal posture: Secondary | ICD-10-CM | POA: Diagnosis present

## 2019-08-23 DIAGNOSIS — R0609 Other forms of dyspnea: Secondary | ICD-10-CM

## 2019-08-23 DIAGNOSIS — J984 Other disorders of lung: Secondary | ICD-10-CM

## 2019-08-23 DIAGNOSIS — R0600 Dyspnea, unspecified: Secondary | ICD-10-CM | POA: Insufficient documentation

## 2019-08-23 NOTE — Telephone Encounter (Signed)
Referral placed    She hasn't started the [place], she is waiting for them to get in contact with her. She doesn't think her Medicaid covers it.

## 2019-08-23 NOTE — Therapy (Deleted)
[place], [place], [address] Phone: [phone]   Fax:  [phone]  Physical Therapy Evaluation  Patient Details  Name: Holly Mueller MRN: [id number] Date of Birth: January 16, 1966 Referring Provider (PT): Zenia Resides, MD    Encounter Date: 08/23/2019  PT End of Session - 08/23/19 1200    Visit Number  1    Number of Visits  1    Date for PT Re-Evaluation  08/24/19    PT Start Time  1101    PT Stop Time  1142    PT Time Calculation (min)  41 min    Activity Tolerance  Other (comment);Patient limited by fatigue;Patient tolerated treatment well   pt limited due to dypsnea with exertion   Behavior During Therapy  [facility] for tasks assessed/performed       Past Medical History:  Diagnosis Date  . Acute respiratory failure with hypoxia ([place]) 09/22/2018  . Anxiety    "anxiety attacks- sometimes"  . Arthritis    bursitis left hip flares-not an issue  . Chronic kidney disease   . Community acquired pneumonia 11/28/2018  . COPD (chronic obstructive pulmonary disease) ([place])   . Diabetes mellitus without complication ([place])    Type II  . Dysrhythmia   . Edentulous    10-19-13 at present  . Epilepsy ([place]) in 1972   No seizures since 1972. Previously treated with phenobarbital.   . Gastric varices with bleeding   . GERD (gastroesophageal reflux disease) 2008  . 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 Headache(784.0)    hx of migraines   . Heart murmur   . 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 History of blood transfusion    gi bleed  . History of kidney stones    passed  . Hypothyroidism 2008  . Interstitial lung disease ([place])   . Leukopenia   . Lower esophageal ring 08/18/2013  . Pneumonia   . Primary biliary cholangitis ([place]) 10/24/2018  . Seasonal allergies 2003  . SEIZURES, HX OF 12/12/2009   Annotation: last  seizure in early 1970s Qualifier: Diagnosis of  By: Carlena Sax  MD, Colletta Maryland    . Shortness of breath    walking  . Wears glasses     Past Surgical History:  Procedure Laterality Date  . ABDOMINAL HYSTERECTOMY     " partial "  . BALLOON DILATION N/A 10/24/2013   Procedure: BALLOON DILATION;  Surgeon: Gatha Mayer, MD;  Location: [facility];  Service: Endoscopy;  Laterality: N/A;  . BIOPSY  09/26/2018   Procedure: BIOPSY;  Surgeon: Thornton Park, MD;  Location: [place];  Service: Gastroenterology;;  . BIOPSY  01/09/2019   Procedure: BIOPSY;  Surgeon: Irving Copas., MD;  Location: [place];  Service: Gastroenterology;;  . CESAREAN SECTION     x2  . DENTAL SURGERY     multiple extractions 3'15  . DILATION AND CURETTAGE OF UTERUS    . ESOPHAGOGASTRODUODENOSCOPY N/A 10/24/2013   Procedure: ESOPHAGOGASTRODUODENOSCOPY (EGD);  Surgeon: Gatha Mayer, MD;  Location: [facility];  Service: Endoscopy;  Laterality: N/A;  . ESOPHAGOGASTRODUODENOSCOPY N/A 01/09/2019   Procedure: ESOPHAGOGASTRODUODENOSCOPY (EGD);  Surgeon: Irving Copas., MD;  Location: [place];  Service: Gastroenterology;  Laterality: N/A;  . ESOPHAGOGASTRODUODENOSCOPY (EGD) WITH PROPOFOL N/A 09/26/2018   Procedure: ESOPHAGOGASTRODUODENOSCOPY (EGD) WITH PROPOFOL;  Surgeon: Thornton Park, MD;  Location: [place];  Service: Gastroenterology;  Laterality: N/A;  . ESOPHAGOGASTRODUODENOSCOPY (EGD) WITH PROPOFOL N/A 04/26/2019   Procedure: ESOPHAGOGASTRODUODENOSCOPY (EGD) WITH PROPOFOL;  Surgeon: Rush Landmark Telford Nab., MD;  Location: [place];  Service: Gastroenterology;  Laterality: N/A;  . HEMOSTASIS CLIP PLACEMENT  04/26/2019   Procedure: HEMOSTASIS CLIP PLACEMENT;  Surgeon: Irving Copas., MD;  Location: [place];  Service: Gastroenterology;;  . IR PARACENTESIS  09/23/2018  . LIVER BIOPSY  06/30/2012   Procedure: LIVER BIOPSY;  Surgeon: Shann Medal, MD;  Location: [facility];   Service: General;;  . NOVASURE ABLATION    . SCLEROTHERAPY  04/26/2019   Procedure: Clide Deutscher;  Surgeon: Mansouraty, Telford Nab., MD;  Location: [place];  Service: Gastroenterology;;  cyanoacrylate  . SHOULDER ARTHROSCOPY Right 2011  . UMBILICAL HERNIA REPAIR N/A 06/30/2012   Procedure: remove umbilicus;  Surgeon: Shann Medal, MD;  [facility];  Service: General;  Laterality: N/A;  . UPPER ESOPHAGEAL ENDOSCOPIC ULTRASOUND (EUS) N/A 01/09/2019   Procedure: UPPER ESOPHAGEAL ENDOSCOPIC ULTRASOUND (EUS);  Surgeon: Irving Copas., MD;  Location: [place];  Service: Gastroenterology;  Laterality: N/A;  . VENTRAL HERNIA REPAIR N/A 06/30/2012   Procedure: LAPAROSCOPIC VENTRAL HERNIA;  Surgeon: Shann Medal, MD;  Location: [facility];  Service: General;  Laterality: N/A;  With Mesh  . VIDEO BRONCHOSCOPY Bilateral 07/20/2018   Procedure: VIDEO BRONCHOSCOPY WITHOUT FLUORO;  Surgeon: Brand Males, MD;  [facility];  Service: Cardiopulmonary;  Laterality: Bilateral;  . WISDOM TOOTH EXTRACTION      Vitals:   08/23/19 1112  BP: 125/65  Pulse: 89  SpO2: 94%     Subjective Assessment - 08/23/19 1112    Subjective  pt presents to OPPT with CC of diffculty breathing especially with any activity and reports increased fatigue especially in her legs with standing/ walking activities. She reports problems with breathing over 5 years and is progressively getting worse. currently uses O2 at 2 LPM via nasal cannual that will increase to 3 LPM when needed. She reports the weakness in her legs that occurs around 3 x a week but depends on activity specially performing sit to stand and transfering to a chair O2 dropped to 88-89% and HR 98 BPM while on 2 LPM O2. Provided handout for pursed lip breathing and general exercise however, based on assessment patient is benefit and is more appropriate for Pulmonary rehab and versus Orthopedic outpatient which pt agreed given her goals to  breath better.    Limitations  Standing;Walking;Lifting;House hold activities    How long can you sit comfortably?  unlimited    How long can you stand comfortably?  2-3 min    How long can you walk comfortably?  2-3 min    Patient Stated Goals  to breath better.    Currently in Pain?  Yes    Pain Score  0-No pain   denies any pain but notes heaviness inthe legs   Pain Orientation  Right;Left    Pain Descriptors / Indicators  Other (Comment)   heavy   Pain Onset  More than a month ago    Pain Frequency  Intermittent    Aggravating Factors   prolonged walking/ standing    Pain Relieving Factors  sitting/ resting    Effect of Pain on Daily Activities  limited endurance secondary to weakness and dyspnea         [facility] PT Assessment - 08/23/19 1120      Assessment   Medical Diagnosis  ILD (interstitial lung  disease) ([place]) J84.9, DOE (dyspnea on exertion) R06.00    Referring Provider (PT)  Hensel, Jamal Collin, MD     Onset Date/Surgical Date  --   over 5 years ago   Hand Dominance  Right    Next MD Visit  unsure    Prior Therapy  yes   for shouder     Precautions   Precautions  None      Restrictions   Weight Bearing Restrictions  No      Balance Screen   Has the patient fallen in the past 6 months  No      [place] residence    Living Arrangements  Other relatives    Available Help at Discharge  Family    Type of Home  Mobile home    [place] to enter    Entrance Stairs-Number of Steps  5    Entrance Stairs-Rails  Can reach both    [place]  One level    [place]  Other (comment)   lift chair, O2,      Prior Function   Level of Independence  Needs assistance with ADLs      Posture/Postural Control   Posture/Postural Control  Postural limitations    Postural Limitations  Forward head;Rounded Shoulders      ROM / Strength   AROM / PROM / Strength  Strength      Strength   Overall Strength Comments   during MMT pt O2 dropped to 84%, HR increased to 100 BPM    Strength Assessment Site  Hip;Knee    Right/Left Hip  Right;Left    Right Hip Flexion  4+/5    Right Hip Extension  4/5    Right Hip ABduction  4/5    Left Hip Flexion  4+/5    Left Hip Extension  4/5    Left Hip ABduction  4/5    Left Hip ADduction  4+/5    Right/Left Knee  Right;Left    Right Knee Flexion  4+/5    Right Knee Extension  4+/5    Left Knee Flexion  4+/5    Left Knee Extension  4+/5      Transfers   Comments  sit to to stand and transfer to chair O2 dropped to 89% and HR 98, BP 117/66   while on 2 LPM of O2               Objective measurements completed on examination: See above findings.              PT Education - 08/23/19 1158    Education Details  evaluation findings, provided HEP and discussed appropriate setting for PT regarding pulmonary rehab    Person(s) Educated  Patient    Methods  Explanation;Verbal cues;Handout    Comprehension  Verbalized understanding;Verbal cues required                  Plan - 08/23/19 1203    Clinical Impression Statement  pt presents to OPPT with CC of dyspnea with exertion that has been on going for over 5 ypt presents to OPPT with CC of diffculty breathing especially with any activity and reports increased fatigue especially in her legs with standing/ walking activities. She reports problems with breathing over 5 years and is progressively getting worse. currently uses O2 at 2 LPM via nasal cannual that will increase to 3 LPM when needed.  She reports the weakness in her legs that occurs around 3 x a week but depends on activity specially performing sit to stand and transfering to a chair O2 dropped to 88-89% and HR 98 BPM while on 2 LPM O2. Provided handout for pursed lip breathing and general exercise however, based on assessment patient is benefit and is more appropriate for Pulmonary rehab and versus Orthopedic outpatient which pt agreed  given her goals to breath better.ears and notes it is progressively worsening. She currently uses portable O2 set at 2LPM via nasal cannula. at resting her O2 at 2LPM is 94% and HR 89 BPM, and with sit to stand and transfer O2 dropped to    Personal Factors and Comorbidities  Comorbidity 3+;Age;Fitness    Examination-Activity Limitations  Stairs;Squat;Stand;Locomotion Level    Stability/Clinical Decision Making  Evolving/Moderate complexity    Clinical Decision Making  Moderate    Rehab Potential  Fair    PT Frequency  One time visit    PT Home Exercise Plan  [id number] -    Recommended Other Services  Pulmonary Rehab    Consulted and Agree with Plan of Care  Patient       Patient will benefit from skilled therapeutic intervention in order to improve the following deficits and impairments:     Visit Diagnosis: Dyspnea on exertion  Abnormal posture     Problem List Patient Active Problem List   Diagnosis Date Noted  . Acute bacterial sinusitis 08/17/2019  . Goals of care, counseling/discussion   . Palliative care by specialist   . Advanced care planning/counseling discussion   . Tachycardia 07/06/2019  . COPD exacerbation ([place]) 06/27/2019  . Interstitial pulmonary fibrosis ([place]) 06/26/2019  . Acute on chronic respiratory failure with hypoxemia ([place]) 06/26/2019  . It band syndrome, left 06/16/2019  . Rectal bleeding 06/16/2019  . On supplemental oxygen therapy   . Nodule of right lung   . Respiratory distress   . Community acquired pneumonia of left lower lobe of lung 05/22/2019  . Petechiae 05/01/2019  . Lower extremity edema 05/01/2019  . Irritant contact dermatitis due to detergent 03/31/2019  . Melena 03/31/2019  . Financial difficulties 03/31/2019  . Skin rash 03/17/2019  . Intertrigo 02/11/2019  . Dysuria 02/11/2019  . Chronic respiratory failure with hypoxia ([place]) 01/11/2019  . Inadequate housing 12/05/2018  . Primary biliary cholangitis ([place]) 10/24/2018  .  Gastric varices with bleeding   . Anemia 09/22/2018  . Pancytopenia ([place]) 08/28/2018  . Does not have health insurance 04/04/2018  . ILD (interstitial lung disease) ([place]) 03/23/2018  . Type 2 diabetes mellitus without complication, without long-term current use of insulin ([place])   . 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 GERD (gastroesophageal reflux disease) 10/23/2016  . Chronic cough 12/09/2015  . Precordial chest pain 08/03/2015  . DOE (dyspnea on exertion) 08/17/2013  . Ganglion cyst of wrist 08/24/2012  . Right hip pain 08/23/2012  . Complex care coordination 02/14/2012  . Obesity 07/08/2010  . Shortness of breath 12/12/2009  . Depression 07/26/2008  . Hypothyroidism 10/12/2006  . Restrictive lung disease 07/30/2006   Starr Lake PT, DPT, LAT, ATC  08/23/19  12:48 PM      [facility] [address] [place], [place], [address] Phone: [phone]   Fax:  [phone]  Name: Holly Mueller MRN: [id number] Date of Birth: 10-Dec-1965

## 2019-08-23 NOTE — Telephone Encounter (Signed)
ATC pt, no answer. Left message for pt to call back.  

## 2019-08-23 NOTE — Telephone Encounter (Signed)
Patient is returning phone call. Patient phone number is [phone].

## 2019-08-23 NOTE — Telephone Encounter (Signed)
Yes for rehab - diagnosis is pulmonaryh fibrosis  Has she started esbriet? What date? And did she have LFT check after that? IF so when?

## 2019-08-23 NOTE — Telephone Encounter (Signed)
lmtcb for pt.  

## 2019-08-23 NOTE — Telephone Encounter (Signed)
Spoke with pt, she needs a referral from her lung specialist to do pulmonary rehab. Her PCP recommended she do this at [facility] MR can we send referral?

## 2019-08-23 NOTE — Therapy (Addendum)
[place], [place], [address] Phone: [phone]   Fax:  [phone]  Physical Therapy Evaluation  Patient Details  Name: Holly Mueller MRN: [id number] Date of Birth: 09-08-1965 Referring Provider (PT): Zenia Resides, MD    Encounter Date: 08/23/2019  PT End of Session - 08/23/19 1200    Visit Number  1    Number of Visits  17    Date for PT Re-Evaluation  08/24/19    PT Start Time  1101    PT Stop Time  1142    PT Time Calculation (min)  41 min    Activity Tolerance  Other (comment);Patient limited by fatigue;Patient tolerated treatment well   pt limited due to dypsnea with exertion   Behavior During Therapy  [facility] for tasks assessed/performed       Past Medical History:  Diagnosis Date  . Acute respiratory failure with hypoxia ([place]) 09/22/2018  . Anxiety    "anxiety attacks- sometimes"  . Arthritis    bursitis left hip flares-not an issue  . Chronic kidney disease   . Community acquired pneumonia 11/28/2018  . COPD (chronic obstructive pulmonary disease) ([place])   . Diabetes mellitus without complication ([place])    Type II  . Dysrhythmia   . Edentulous    10-19-13 at present  . Epilepsy ([place]) in 1972   No seizures since 1972. Previously treated with phenobarbital.   . Gastric varices with bleeding   . GERD (gastroesophageal reflux disease) 2008  . 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 Headache(784.0)    hx of migraines   . Heart murmur   . 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 History of blood transfusion    gi bleed  . History of kidney stones    passed  . Hypothyroidism 2008  . Interstitial lung disease ([place])   . Leukopenia   . Lower esophageal ring 08/18/2013  . Pneumonia   . Primary biliary cholangitis ([facility]) 10/24/2018  . Seasonal allergies 2003  . SEIZURES, HX OF 12/12/2009   Annotation: last  seizure in early 1970s Qualifier: Diagnosis of  By: Carlena Sax  MD, Colletta [place]    . Shortness of breath    walking  . Wears glasses     Past Surgical History:  Procedure Laterality Date  . ABDOMINAL HYSTERECTOMY     " partial "  . BALLOON DILATION N/A 10/24/2013   Procedure: BALLOON DILATION;  Surgeon: Gatha Mayer, MD;  [facility];  Service: Endoscopy;  Laterality: N/A;  . BIOPSY  09/26/2018   Procedure: BIOPSY;  Surgeon: Thornton Park, MD;  Location: [place];  Service: Gastroenterology;;  . BIOPSY  01/09/2019   Procedure: BIOPSY;  Surgeon: Irving Copas., MD;  Location: [place];  Service: Gastroenterology;;  . CESAREAN SECTION     x2  . DENTAL SURGERY     multiple extractions 3'15  . DILATION AND CURETTAGE OF UTERUS    . ESOPHAGOGASTRODUODENOSCOPY N/A 10/24/2013   Procedure: ESOPHAGOGASTRODUODENOSCOPY (EGD);  Surgeon: Gatha Mayer, MD;  Location: [facility];  Service: Endoscopy;  Laterality: N/A;  . ESOPHAGOGASTRODUODENOSCOPY N/A 01/09/2019   Procedure: ESOPHAGOGASTRODUODENOSCOPY (EGD);  Surgeon: Irving Copas., MD;  Location: [facility];  Service: Gastroenterology;  Laterality: N/A;  . ESOPHAGOGASTRODUODENOSCOPY (EGD) WITH PROPOFOL N/A 09/26/2018   Procedure: ESOPHAGOGASTRODUODENOSCOPY (EGD) WITH PROPOFOL;  Surgeon: Thornton Park, MD;  Location: [place];  Service: Gastroenterology;  Laterality: N/A;  . ESOPHAGOGASTRODUODENOSCOPY (EGD) WITH PROPOFOL N/A 04/26/2019   Procedure: ESOPHAGOGASTRODUODENOSCOPY (EGD) WITH PROPOFOL;  Surgeon: Rush Landmark Telford Nab., MD;  Location: [facility];  Service: Gastroenterology;  Laterality: N/A;  . HEMOSTASIS CLIP PLACEMENT  04/26/2019   Procedure: HEMOSTASIS CLIP PLACEMENT;  Surgeon: Irving Copas., MD;  Location: [facility];  Service: Gastroenterology;;  . IR PARACENTESIS  09/23/2018  . LIVER BIOPSY  06/30/2012   Procedure: LIVER BIOPSY;  Surgeon: Shann Medal, MD;  [facility];   Service: General;;  . NOVASURE ABLATION    . SCLEROTHERAPY  04/26/2019   Procedure: Clide Deutscher;  Surgeon: Mansouraty, Telford Nab., MD;  Location: [facility];  Service: Gastroenterology;;  cyanoacrylate  . SHOULDER ARTHROSCOPY Right 2011  . UMBILICAL HERNIA REPAIR N/A 06/30/2012   Procedure: remove umbilicus;  Surgeon: Shann Medal, MD;  [facility];  Service: General;  Laterality: N/A;  . UPPER ESOPHAGEAL ENDOSCOPIC ULTRASOUND (EUS) N/A 01/09/2019   Procedure: UPPER ESOPHAGEAL ENDOSCOPIC ULTRASOUND (EUS);  Surgeon: Irving Copas., MD;  Location: [facility];  Service: Gastroenterology;  Laterality: N/A;  . VENTRAL HERNIA REPAIR N/A 06/30/2012   Procedure: LAPAROSCOPIC VENTRAL HERNIA;  Surgeon: Shann Medal, MD;  [facility];  Service: General;  Laterality: N/A;  With Mesh  . VIDEO BRONCHOSCOPY Bilateral 07/20/2018   Procedure: VIDEO BRONCHOSCOPY WITHOUT FLUORO;  Surgeon: Brand Males, MD;  [facility];  Service: Cardiopulmonary;  Laterality: Bilateral;  . WISDOM TOOTH EXTRACTION      Vitals:   08/23/19 1112  BP: 125/65  Pulse: 89  SpO2: 94%     Subjective Assessment - 08/23/19 1112    Subjective  pt is a 54 y.o F with CC of dyspnea with exertion starting over 5 years ago that has progressively been worsening. She denies any pain but does not fatigue in the legs with prolonged standing / walking which she attributes could be related to dypsnea. she currenlt has a portable O2 that she uses via nasal cannual at 2LPM and will increase to 3 LPM as needed.    Limitations  Standing;Walking;Lifting;House hold activities    How long can you sit comfortably?  unlimited    How long can you stand comfortably?  2-3 min    How long can you walk comfortably?  2-3 min    Patient Stated Goals  to breath better.    Currently in Pain?  Yes    Pain Score  0-No pain   denies any pain but notes heaviness inthe legs   Pain Orientation  Right;Left    Pain  Descriptors / Indicators  Other (Comment)   heavy   Pain Onset  More than a month ago    Pain Frequency  Intermittent    Aggravating Factors   prolonged walking/ standing    Pain Relieving Factors  sitting/ resting    Effect of Pain on Daily Activities  limited endurance secondary to weakness and dyspnea            [facility] PT Assessment - 08/24/19 0001      Assessment   Medical Diagnosis  ILD (interstitial lung disease) (HCC) J84.9, DOE (dyspnea on exertion) R06.00    Referring Provider (PT)  Hensel, Jamal Collin, MD     Onset Date/Surgical Date  --   over 5 years ago   Hand Dominance  Right    Next MD Visit  unsure    Prior Therapy  yes   for shouder  Precautions   Precautions  None      Restrictions   Weight Bearing Restrictions  No      Home Environment   Living Environment  Private residence    Living Arrangements  Other relatives    Available Help at Discharge  Family    Type of [place] to enter    Entrance Stairs-Number of Steps  5    Entrance Stairs-Rails  Can reach both    [place]  One level    [place]  Other (comment)   lift chair, O2,      Prior Function   Level of Independence  Needs assistance with ADLs      Posture/Postural Control   Posture/Postural Control  Postural limitations    Postural Limitations  Forward head;Rounded Shoulders      Strength   Overall Strength Comments  during MMT pt O2 dropped to 84%, HR increased to 100 BPM    Right Hip Flexion  4+/5    Right Hip Extension  4/5    Right Hip ABduction  4/5    Left Hip Flexion  4+/5    Left Hip Extension  4/5    Left Hip ABduction  4/5    Left Hip ADduction  4+/5    Right Knee Flexion  4+/5    Right Knee Extension  4+/5    Left Knee Flexion  4+/5    Left Knee Extension  4+/5      Transfers   Comments  sit to to stand and transfer to chair O2 dropped to 89% and HR 98, BP 117/66   while on 2 LPM of O2     Ambulation/Gait   Ambulation/Gait   Yes    Gait Pattern  Decreased stride length;Step-through pattern;Trendelenburg;Decreased trunk rotation;Trunk flexed    Gait Comments  walking back to treatment room RPE 18/20, and BORG 9/10          Objective measurements completed on examination: See above findings.              PT Education - 08/23/19 1158    Education Details  evaluation findings, provided HEP and discussed appropriate setting for PT regarding pulmonary rehab    Person(s) Educated  Patient    Methods  Explanation;Verbal cues;Handout    Comprehension  Verbalized understanding;Verbal cues required       PT Short Term Goals - 08/24/19 1243      PT SHORT TERM GOAL #1   Title  pt to be I with initial HEp    Baseline  no previous HEP    Time  3    Period  Weeks    Status  New    Target Date  09/21/19      PT SHORT TERM GOAL #2   Title  pt to verbalize/ demo proper posture and lifting mechanics to promote respiration and reduce fatigue    Baseline  no previous knowledge of good posture    Time  3    Period  Weeks    Status  New    Target Date  09/21/19        PT Long Term Goals - 08/24/19 1244      PT LONG TERM GOAL #1   Title  pt to be able to stand/ walk for >/= 20 min with LRAD using </= 2 LPM O2 mainting SPO2 to >/= 94% for improvement in function.  Baseline  standing/ walking 2 min pt desatted to 86% with 2LPM O2    Time  8    Period  Weeks    Status  New    Target Date  10/19/19      PT LONG TERM GOAL #2   Title  pt to be able to perform grocery shopping, in home and community ambulation and ADLS with </= 76/81  RPE and </=5/10 modified BORG scale    Baseline  inital BORG with walking 9/10, and RPE with 2 min walk 18/20    Time  8    Period  Weeks    Status  New    Target Date  10/19/19      PT LONG TERM GOAL #3   Title  pt to be I with all HEP given as of last visit to maintain and progress current level of function    Baseline  no previous HEP    Time  6    Period   Weeks    Status  New    Target Date  10/19/19      PT LONG TERM GOAL #4   Title  pt to be able to complete  6 min walk with </=2LPM O2 with </= 1 rest break  to demonstrate functional improvement    Baseline  unable to perofrm 6 min wlak test    Time  8    Period  Weeks    Status  New    Target Date  10/19/19             Plan - 08/23/19 1203    Clinical Impression Statement  pt presents to OPPT with CC of dyspnea with exertion that has been on going for over 5 ypt presents to OPPT with CC of diffculty breathing especially with any activity and reports increased fatigue especially in her legs with standing/ walking activities. She reports problems with breathing over 5 years and is progressively getting worse. currently uses O2 at 2 LPM via nasal cannual that will increase to 3 LPM when needed. She reports the weakness in her legs that occurs around 3 x a week but depends on activity specially performing sit to stand and transfering to a chair O2 dropped to 88-89% and HR 98 BPM while on 2 LPM O2. Provided handout for pursed lip breathing and general exercise however, based on assessment patient is benefit and is more appropriate for Pulmonary rehab and versus Orthopedic outpatient which pt agreed given her goals to breath better.ears and notes it is progressively worsening.She would benefit from physical therapy to promote endurnace/ reduce, improve SPO2, and maximize overall function and safety.   pt would most benefit from pulmonary rehab. However pulmonary rehab does not accept Medicaid INS.   Personal Factors and Comorbidities  Comorbidity 3+;Age;Fitness    Examination-Activity Limitations  Stairs;Squat;Stand;Locomotion Level    Stability/Clinical Decision Making  Evolving/Moderate complexity    Clinical Decision Making  Moderate    Rehab Potential  Fair    PT Frequency  2x / week    PT Duration  8 weeks   inital MCD auth 1 x a week for 3 weeks.   PT Treatment/Interventions   ADLs/Self Care Home Management;Cryotherapy;Moist Heat;Neuromuscular re-education;Gait training;Stair training;Therapeutic activities;Functional mobility training;Therapeutic exercise;Balance training;Passive range of motion;Energy conservation;Taping    PT Next Visit Plan  review/ update HEP PRN, perform 2 min test, monitor O2/ BP, pt uses O2 set at 2 LPM, gradual work into endurnace/ Nu-step, monitor BORG/  RPE,    PT Home Exercise Plan  [phone] - seated marching, clamshells, pursed lipped breathing, with rib elevation/ trunk flexion/ extension    Recommended Other Services  Pulmonary Rehab    Consulted and Agree with Plan of Care  Patient       Patient will benefit from skilled therapeutic intervention in order to improve the following deficits and impairments:  Pain, Decreased activity tolerance, Decreased endurance, Decreased knowledge of precautions, Postural dysfunction, Improper body mechanics, Cardiopulmonary status limiting activity  Visit Diagnosis: Dyspnea on exertion - Plan: PT plan of care cert/re-cert  Abnormal posture - Plan: PT plan of care cert/re-cert     Problem List Patient Active Problem List   Diagnosis Date Noted  . Acute bacterial sinusitis 08/17/2019  . Goals of care, counseling/discussion   . Palliative care by specialist   . Advanced care planning/counseling discussion   . Tachycardia 07/06/2019  . COPD exacerbation ([place]) 06/27/2019  . Interstitial pulmonary fibrosis ([place]) 06/26/2019  . Acute on chronic respiratory failure with hypoxemia ([place]) 06/26/2019  . It band syndrome, left 06/16/2019  . Rectal bleeding 06/16/2019  . On supplemental oxygen therapy   . Nodule of right lung   . Respiratory distress   . Community acquired pneumonia of left lower lobe of lung 05/22/2019  . Petechiae 05/01/2019  . Lower extremity edema 05/01/2019  . Irritant contact dermatitis due to detergent 03/31/2019  . Melena 03/31/2019  . Financial difficulties 03/31/2019  .  Skin rash 03/17/2019  . Intertrigo 02/11/2019  . Dysuria 02/11/2019  . Chronic respiratory failure with hypoxia ([place]) 01/11/2019  . Inadequate housing 12/05/2018  . Primary biliary cholangitis ([place]) 10/24/2018  . Gastric varices with bleeding   . Anemia 09/22/2018  . Pancytopenia ([place]) 08/28/2018  . Does not have health insurance 04/04/2018  . ILD (interstitial lung disease) ([place]) 03/23/2018  . Type 2 diabetes mellitus without complication, without long-term current use of insulin ([place])   . 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 GERD (gastroesophageal reflux disease) 10/23/2016  . Chronic cough 12/09/2015  . Precordial chest pain 08/03/2015  . DOE (dyspnea on exertion) 08/17/2013  . Ganglion cyst of wrist 08/24/2012  . Right hip pain 08/23/2012  . Complex care coordination 02/14/2012  . Obesity 07/08/2010  . Shortness of breath 12/12/2009  . Depression 07/26/2008  . Hypothyroidism 10/12/2006  . Restrictive lung disease 07/30/2006   [place] PT, DPT, LAT, ATC  08/24/19  1:05 PM      [facility] [address] [place], [place], [address] Phone: [phone]   Fax:  [phone]  Name: Ailany Koren MRN: [id number] Date of Birth: [birth year]

## 2019-08-24 ENCOUNTER — Ambulatory Visit: Payer: Medicaid Other

## 2019-08-24 ENCOUNTER — Telehealth (HOSPITAL_COMMUNITY): Payer: Self-pay | Admitting: *Deleted

## 2019-08-24 ENCOUNTER — Encounter (HOSPITAL_COMMUNITY): Payer: Self-pay | Admitting: *Deleted

## 2019-08-24 NOTE — Addendum Note (Signed)
Addended by: Larey Days on: 08/24/2019 01:26 PM   Modules accepted: Orders

## 2019-08-24 NOTE — Progress Notes (Signed)
Received referral from Dr. Chase Caller for this pt to participate in Pulmonary Rehab with the diagnosis of Restrictive Lung Disease. Reviewed pt medical history and noted that pt has medicaid.  Unfortunately medicaid does not currently reimburse for services for Pulmonary rehab.  Reviewed pt PT consult note from yesterday 09/02/19 - Kristoffer Leamon.  Called and left message with [facility] indicating that medicaid does not reimburse for pulmonary rehab.  Advised that we could offer pulmonary maintenance which is self pay independent exercise program. Provided my contact information for any additional questions.  Will notify this pt of her option.  If financially able to pay out of pocket will request referral for pulmonary maintenance from Dr. Chase Caller. Cherre Huger, BSN Cardiac and Training and development officer

## 2019-08-24 NOTE — Telephone Encounter (Signed)
Called and spoke to pt advising her that unfortunately at this time Medicaid does not reimburse for Pulmonary rehab. Talked with pt briefly regarding self- pay maintenance program.  Pt indicated that she could not financially afford to pay any monies out of pocket.  Pt made aware message left for therapist she saw on yesterday to continue working with her.  [name] stated that she had received a call and has an appt next week.  Thanked me for the call. Cherre Huger, BSN Cardiac and Training and development officer

## 2019-08-25 ENCOUNTER — Telehealth: Payer: Self-pay | Admitting: Internal Medicine

## 2019-08-25 NOTE — Telephone Encounter (Signed)
Spoke with patient. She had several issues to discuss. First, she wanted to discuss that she has not received her [employer] yet. I looked at her chart and see that the paperwork was faxed over on 08/22/19. Advised her that it takes time for them to process the paperwork, especially if patient assistant is involved.   While on the phone, she stated that she has been more SOB in the past 2 days. She is currently on 2L of O2 but she when checked her O2, her O2 reading was in the 60s. She has been feeling weak, especially in her knees and legs. She stated that she does not feel like herself. She has not noticed a difference in her cough. Denied having an fevers, body aches or being around anyone with covid. I advised her to call 911 but she wanted recommendations first.   Beth, please advise. Thanks.

## 2019-08-25 NOTE — Telephone Encounter (Signed)
Advised she increase her oxygen to 3-4L. If oxygen reading remains <90% needs to call EMS. Agree with Esbriet medication/patient assistance

## 2019-08-25 NOTE — Telephone Encounter (Signed)
Spoke with pt, aware of recs.  Scheduled pt for a televisit on Monday to ensure she is improving, stressed the importance of calling EMS if her O2 does not improve or if her s/s worsen.  Nothing further needed at this time- will close encounter.

## 2019-08-28 ENCOUNTER — Telehealth: Payer: Self-pay | Admitting: Pharmacist

## 2019-08-28 ENCOUNTER — Encounter: Payer: Self-pay | Admitting: Primary Care

## 2019-08-28 ENCOUNTER — Other Ambulatory Visit: Payer: Self-pay

## 2019-08-28 ENCOUNTER — Ambulatory Visit (INDEPENDENT_AMBULATORY_CARE_PROVIDER_SITE_OTHER): Payer: Medicaid Other | Admitting: Primary Care

## 2019-08-28 ENCOUNTER — Telehealth: Payer: Self-pay | Admitting: Family Medicine

## 2019-08-28 ENCOUNTER — Telehealth: Payer: Self-pay | Admitting: Primary Care

## 2019-08-28 DIAGNOSIS — J849 Interstitial pulmonary disease, unspecified: Secondary | ICD-10-CM | POA: Diagnosis not present

## 2019-08-28 DIAGNOSIS — J441 Chronic obstructive pulmonary disease with (acute) exacerbation: Secondary | ICD-10-CM | POA: Diagnosis not present

## 2019-08-28 MED ORDER — BENZONATATE 200 MG PO CAPS: 200.0000 mg | ORAL_CAPSULE | Freq: Three times a day (TID) | ORAL | 1 refills | Status: AC | PRN

## 2019-08-28 MED ORDER — PREDNISONE 10 MG PO TABS: ORAL_TABLET | ORAL | 0 refills | Status: AC

## 2019-08-28 MED FILL — BENZONATATE 200 MG CAPS: 200 | 10 days supply | Qty: 30 | Fill #0

## 2019-08-28 MED FILL — predniSONE 10 MG TABS: 10 | 12 days supply | Qty: 30 | Fill #0

## 2019-08-28 NOTE — Telephone Encounter (Signed)
Received paperwork from [employer].  Fax states [employer] is unable to conduct the benefits investigation as insurance does not speak to third parties.  Requesting office to call patient's insurance to obtain benefits, specialty pharmacy, and prior authorization requirement information.  Patient has [place] Medicaid which does not require prior authorization for Esbriet.  Patient is eligible to fill at [facility].  Set up time to schedule an televisit appointment to provide Esbriet counseling and assist in specialty pharmacy set up on 4/21 at 3:20pm.   Mariella Saa, PharmD, [place], [place] Clinical Specialty Pharmacist [phone]  08/28/2019 2:20 PM

## 2019-08-28 NOTE — Telephone Encounter (Signed)
**  After Hours/ Emergency Line Call**  Received a call to report that [name] was complaining of lowered oxygen saturation levels and questions about her heart rate.  She spoke with her pulmonologist today regarding her low O2 levels which she says were in the low 80s to high 70s. She was prescribed prednisone but was not able to fill it today.  Her supp o2 was increased to 4L.  Now she endorses her levels being 89% with normal being ~92-93% on 2L. Her main concern tonight was her heart rate which she said was in the 60s and went to the 80s.  I told her this was a normal range and it can fluctuate with each read.  I advised the patient to not hesitate to come to the ED if her condition worsens at all or if her 02 sats drop consistently more than 1 or 2 points from what she reported on the phone.   Red flags discussed.  Will forward to PCP.  PE: She has a persistent cough on the phone, but states this is chronic and was able to speak full sentences when not coughing.   Clemetine Marker, MD PGY-2, [facility] 08/28/2019 7:25 PM

## 2019-08-28 NOTE — Telephone Encounter (Signed)
Needs 2-3 day follow-up televisit with me or MR

## 2019-08-28 NOTE — Progress Notes (Signed)
Virtual Visit via Telephone Note  I connected with Holly Mueller on 08/28/19 at  3:30 PM EDT by telephone and verified that I am speaking with the correct person using two identifiers.  Location: Patient: Home Provider: Office   I discussed the limitations, risks, security and privacy concerns of performing an evaluation and management service by telephone and the availability of in person appointments. I also discussed with the patient that there may be a patient responsible charge related to this service. The patient expressed understanding and agreed to proceed.   History of Present Illness:  Follow-up probable UIP on CT scan of the chest November 2019 with worsening compared to July 2018.  Female gender. Positive ANA and SSA associated with clubbing and bilateral basal Velcro crackles -.  Associated with cirrhosis Hepatic cirrhosis ] due to primary biliary cholangitis (HCC) and low platelets, and gastric varices with bleeding. BAL March 2020 with mixed ceullarity - 54% PMN nand 28% lymphs c/w UIP. On supportive care due to cirrhosis . Recommended disability end sept 3337  HPI: 54 year old female, never smoked. PMH significant for ILD, asthma, restrictive lung disease, chronic respiratory failure, cough cough, GERD, biliary cholangitis, hepatic cirrhosis, pancytopenia, type 2 diabetes, anemia, gastric varices, CKD, hx seizures. Patient of Dr. Chase Caller, last seen on 06/26/19 by pulmonary NP for acute televisit with dyspnea and cough who was ultimately sent to ED and hospitalized for COPD exacerbation. Maintained on Symbicort 80, Spiriva handihaler, prn albuterol hfa/neb and 2L oxygen. Probably UIP on CT chest November 2019, progression since July 2018. Positive ANA and SSA. Repeat PFTs in 2020 showed stable FVC but decrease in DLCO. Discussed with patient that treatment with anti-fibrotics are risky in the presence of cirrhosis.   ED visit 3/4-07/14/19 Presented with 3-4 days of shortness of  breath with associated cough, chest tightness and wheezing. Low grade temp. Requiring 3L oxygen. Covid negative. CXR without acute abnormality. CT chest negative for PE, findings suggestive of UIP as seen back in January 2021. New streaky/hazy airspace opacities in posterior lower lungs possibly d.t atelectasis or early infectious etiology. Sent home on prednisone 3m x 5 days and Doxycycline course.   07/17/2019 Patient presents today for ED follow-up. She feels somewhat better but drained. She gets short of breath moving around, which is not necessarily new for her. She has two days left of prednisone course and continue oral doxycycline. Takes Symbicort 80 twice daily and Spiriva as prescribed daily. For the first couple of days after leaving the hospital she needed to use her albuterol rescue inhaler every 4 hours. Now requires SABA once or twice a day. She has a chronic dry cough, takes mucinex at night a few times a week. Wearing 2-3L oxygen at home. Lives with her brother and sister. Mainly stays in bed, playing games, but she does get up and move around. States that she feeds her sister's cat. She has been afebrile since ED stay. Denies fever, chills, wheezing, chest tightness,  nausea, vomiting, abdominal pain, diarrhea. She has not received Covid vaccine.   08/28/2019  Patient contacted today for acute televisit, reports low oxygen levels with exertion. She called office on Friday 4/16 with reports of O2 level drops into 70-80s on 2L while walking into the bathroom. She was advised to increase her oxygen to 3-4L and if O2 did not improve call EMS/present to ED for further evaluation. She states that as she doesn't move she is ok. Currently she is on 3L and her oxygen  level is 83%. Advised her to turn oxygen up to 4L and level came up to 91%. With walking her O2 stay above 85-87% and recovered to 90%. She finished oral Augmentin course 2-3 days. Prescription for Esbriet was initiated, office process has  now changed for antifibrotic medication start and pulmonary pharmacy has taken over processing benefits investigation. Medicaid does not require PA and she is eligible to fill at [facility]. She has been scheduled for medication review with specialty pharmacy to review medication in depth.     Observations/Objective:  - Moderate shortness of breath with speaking. No audible wheezing or stridor. 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 Exposures: Had exposed to mold in the past.  No dampness, heart type, Jacuzzi Smoking history: Denies smoking.  She is exposed to secondhand smoke Travel history: No significant travel history Relevant family history: No significant family history of lung disease.  Significant testing: Most recent CT scan of the chest in late 2019 is reported as probable UIP based on craniocaudal gradient and some traction bronchiectasis without honeycombing.  The findings are early and mild.   Serologies shows positive ANA, SSA- referred to rheumatology  PFTs from July 2018 which suggest restriction.    Assessment and Plan:  COPD VS ILD exacerbation: - Continue Symbicort 80 two puff twice daily; Spiriva HandiHaler daily - Rx Prednisone taper 41m x 3 days; 38mx 2 days; 2036m 2 days; 79m58m2 days  Acute on Chronic respiratory failure  - Increase oxygen 4L  - Advised patient if O2 sustaining <88% patient needs to present to ED for evaluation   ILD/pulmonary fibrosis: - Prescription for Esbriet was initiated in March, office process has now changed for antifibrotic medication and pulmonary pharmacy has taken over processing benefits investigation. Medicaid does not require PA and [facility] has been scheduled for medication review with specialty  pharmacy to review medication in depth.  - Needs LFTs checked every two weeks x 6 months. Standing lab order placed in chart for liver function panel q 2 weeks  - Referred to pulmonary rehab on 08/23/19   Follow Up Instructions:   - 2-3 day follow-up televisit with APP  I discussed the assessment and treatment plan with the patient. The patient was provided an opportunity to ask questions and all were answered. The patient agreed with the plan and demonstrated an understanding of the instructions.   The patient was advised to call back or seek an in-person evaluation if the symptoms worsen or if the condition fails to improve as anticipated.  I provided 22 minutes of non-face-to-face time during this encounter.   ElizMartyn Ehrich

## 2019-08-28 NOTE — Patient Instructions (Signed)
  COPD VS ILD exacerbation: - Continue Symbicort 80 two puff twice daily; Spiriva HandiHaler daily - Rx Prednisone taper 47m x 3 days; 336mx 2 days; 2081m 2 days; 79m74m2 days  Acute on Chronic respiratory failure  - Increase oxygen 4L  - Advised patient if O2 sustaining <88% patient needs to present to ED for evaluation   ILD: - Prescription for Esbriet, set up with pharmacy to discuss medication  - Needs LFTs checked every two weeks x 6 months. Standing lab order placed in chart for liver function

## 2019-08-29 NOTE — Telephone Encounter (Signed)
Left a voicemail on patient's phone to call our oiffice back to schedule a televisit with Beth.

## 2019-08-29 NOTE — Telephone Encounter (Signed)
Pt called back. Televisit was sched for 09/01/19

## 2019-08-29 NOTE — Telephone Encounter (Signed)
Ok thanks. We will ensure that workflows are uniform. I was not directly involved when the prescription was written I think.  Will close message. Reply not needed

## 2019-08-29 NOTE — Telephone Encounter (Signed)
Patient has appointment with pulmonology 4/21

## 2019-08-30 ENCOUNTER — Other Ambulatory Visit: Payer: Self-pay

## 2019-08-30 ENCOUNTER — Ambulatory Visit: Payer: Medicaid Other | Admitting: Pharmacist

## 2019-08-30 DIAGNOSIS — Z7189 Other specified counseling: Secondary | ICD-10-CM

## 2019-08-30 DIAGNOSIS — J849 Interstitial pulmonary disease, unspecified: Secondary | ICD-10-CM

## 2019-08-30 MED ORDER — ESBRIET 267 MG PO TABS: ORAL_TABLET | ORAL | 0 refills | Status: DC

## 2019-08-30 MED ORDER — ESBRIET 267 MG PO TABS: ORAL_TABLET | ORAL | 0 refills | Status: AC

## 2019-08-30 NOTE — Progress Notes (Signed)
HPI  Patient connected via phone for Initial appt with pharmacy team for [facility] counseling. Pertinent past medical history includes ILD, asthma, COPD, GERD, hypothyroidism, DM2, history of liver cirrhosis, seizures, and pancytopenia . She is naive to anti-fibrotic therapy.  OBJECTIVE Allergies  Allergen Reactions  . Aspirin Nausea Only and Other (See Comments)    Upset stomach    Outpatient Encounter Medications as of 08/30/2019  Medication Sig  . Accu-Chek Softclix Lancets lancets Use with three times daily with meals and at night. E11.9  . Acetaminophen (APAP) 325 MG tablet Take 2 tablets (650 mg total) by mouth every 6 (six) hours as needed.  Marland Kitchen albuterol (PROAIR HFA) 108 (90 Base) MCG/ACT inhaler INHALE 2 PUFFS INTO THE LUNGS EVERY 6 HOURS AS NEEDED FOR SHORTNESS OF BREATH (Patient taking differently: Inhale 2 puffs into the lungs every 4 (four) hours as needed for wheezing or shortness of breath. )  . albuterol (PROVENTIL) (2.5 MG/3ML) 0.083% nebulizer solution Take 3 mLs (2.5 mg total) by nebulization every 6 (six) hours as needed for wheezing or shortness of breath.  . AMBULATORY NON FORMULARY MEDICATION Nitroglycerin oint. 0.125% Apply a small pea size amount BID for 6-8 weeks  . benzonatate (TESSALON) 200 MG capsule Take 1 capsule (200 mg total) by mouth 3 (three) times daily as needed for cough.  . blood glucose meter kit and supplies Dispense based on patient and insurance preference. Use up to four times daily as directed. (FOR ICD-10 E10.9, E11.9).  Marland Kitchen Blood Glucose Monitoring Suppl (ACCU-CHEK GUIDE) w/Device KIT 1 Device by Does not apply route as directed. Use with three times daily with meals and at night. E11.9  . budesonide-formoterol (SYMBICORT) 80-4.5 MCG/ACT inhaler Inhale 2 puffs into the lungs 2 (two) times daily for 1 day.  . cetirizine (ZYRTEC) 10 MG tablet Take 1 tablet (10 mg total) by mouth daily.  Marland Kitchen dextromethorphan-guaiFENesin (MUCINEX DM) 30-600 MG  12hr tablet Take 1-2 tablets by mouth at bedtime as needed for cough.  . diclofenac Sodium (VOLTAREN) 1 % GEL Apply 4 g topically 4 (four) times daily as needed (pain).  . fluticasone (FLONASE) 50 MCG/ACT nasal spray Place 2 sprays into both nostrils in the morning and at bedtime.   Marland Kitchen glucose blood (ACCU-CHEK GUIDE) test strip Use with three times daily with meals and at night. E11.9  . Lancets Misc. (ACCU-CHEK SOFTCLIX LANCET DEV) KIT Use with three times daily with meals and at night. E11.9  . levothyroxine (SYNTHROID) 125 MCG tablet Take 1 tablet (125 mcg total) by mouth 2 (two) times daily.  . metFORMIN (GLUCOPHAGE) 500 MG tablet Take 1 tablet (500 mg total) by mouth 2 (two) times daily with a meal.  . OXYGEN Inhale 2 L into the lungs continuous.   . pantoprazole (PROTONIX) 40 MG tablet Take 1 tablet (40 mg total) by mouth 2 (two) times daily before a meal.  . predniSONE (DELTASONE) 10 MG tablet Take 4 tabs po daily x 3 days; then 3 tabs daily x3 days; then 2 tabs daily x3 days; then 1 tab daily x 3 days; then stop  . SPIRIVA HANDIHALER 18 MCG inhalation capsule INHALE 1 CAPSULE VIA HANDIHALER ONCE DAILY AT THE SAME TIME EVERY DAY   No facility-administered encounter medications on file as of 08/30/2019.     Immunization History  Administered Date(s) Administered  . Influenza-Unspecified 01/11/2019  . PFIZER SARS-COV-2 Vaccination 08/12/2019, 08/26/2019  . Pneumococcal Polysaccharide-23 08/26/2011, 09/12/2017, 05/25/2019  . Td 05/11/2006  .  Tdap 05/30/2019     HRCT  PFT's No results found for: FEV1, FVC, FEV1FVC, TLC, DLCO   CMP     Component Value Date/Time   NA 139 07/13/2019 1514   NA 136 05/30/2019 1611   NA 137 11/17/2016 1208   K 3.9 07/13/2019 1514   K 4.2 11/17/2016 1208   CL 112 (H) 07/13/2019 1514   CO2 21 (L) 07/13/2019 1514   CO2 24 11/17/2016 1208   GLUCOSE 169 (H) 07/13/2019 1514   GLUCOSE 216 (H) 11/17/2016 1208   BUN 10 07/13/2019 1514   BUN 9 05/30/2019  1611   BUN 7.2 11/17/2016 1208   CREATININE 0.59 07/13/2019 1514   CREATININE 0.68 06/01/2019 1435   CREATININE 0.8 11/17/2016 1208   CALCIUM 8.8 (L) 07/13/2019 1514   CALCIUM 9.3 11/17/2016 1208   PROT 6.9 07/13/2019 1514   PROT 6.9 05/30/2019 1611   PROT 8.4 (H) 11/17/2016 1208   ALBUMIN 2.6 (L) 07/13/2019 1514   ALBUMIN 3.1 (L) 05/30/2019 1611   ALBUMIN 3.3 (L) 11/17/2016 1208   AST 41 07/13/2019 1514   AST 45 (H) 06/01/2019 1435   AST 61 (H) 11/17/2016 1208   ALT 32 07/13/2019 1514   ALT 31 06/01/2019 1435   ALT 46 11/17/2016 1208   ALKPHOS 167 (H) 07/13/2019 1514   ALKPHOS 148 11/17/2016 1208   BILITOT 1.7 (H) 07/13/2019 1514   BILITOT 1.9 (H) 06/01/2019 1435   BILITOT 2.38 (H) 11/17/2016 1208   GFRNONAA >60 07/13/2019 1514   GFRNONAA >60 06/01/2019 1435   GFRAA >60 07/13/2019 1514   GFRAA >60 06/01/2019 1435     CBC    Component Value Date/Time   WBC 1.1 (LL) 07/13/2019 1514   RBC 3.52 (L) 07/13/2019 1514   HGB 9.0 (L) 07/13/2019 1514   HGB 10.3 (L) 06/01/2019 1435   HGB 10.1 (L) 04/28/2019 1355   HGB 12.5 11/17/2016 1204   HCT 29.3 (L) 07/13/2019 1514   HCT 30.7 (L) 04/28/2019 1355   HCT 36.2 11/17/2016 1204   PLT 51 (L) 07/13/2019 1514   PLT 56 (L) 06/01/2019 1435   PLT 64 (LL) 09/13/2018 1447   MCV 83.2 07/13/2019 1514   MCV 87 04/28/2019 1355   MCV 86.8 11/17/2016 1204   MCH 25.6 (L) 07/13/2019 1514   MCHC 30.7 07/13/2019 1514   RDW 15.5 07/13/2019 1514   RDW 14.8 04/28/2019 1355   RDW 14.1 11/17/2016 1204   LYMPHSABS 0.2 (L) 07/13/2019 1514   LYMPHSABS 0.3 (L) 04/28/2019 1355   LYMPHSABS 0.6 (L) 11/17/2016 1204   MONOABS 0.2 07/13/2019 1514   MONOABS 0.2 11/17/2016 1204   EOSABS 0.1 07/13/2019 1514   EOSABS 0.1 04/28/2019 1355   BASOSABS 0.0 07/13/2019 1514   BASOSABS 0.0 04/28/2019 1355   BASOSABS 0.0 11/17/2016 1204     LFT's Hepatic Function Latest Ref Rng & Units 07/13/2019 06/28/2019 06/27/2019  Total Protein 6.5 - 8.1 g/dL 6.9 6.1(L)  6.3(L)  Albumin 3.5 - 5.0 g/dL 2.6(L) 2.3(L) 2.3(L)  AST 15 - 41 U/L 41 31 44(H)  ALT 0 - 44 U/L 32 25 28  Alk Phosphatase 38 - 126 U/L 167(H) 98 110  Total Bilirubin 0.3 - 1.2 mg/dL 1.7(H) 1.3(H) 1.7(H)  Bilirubin, Direct 0.0 - 0.2 mg/dL - 0.4(H) -     ASSESSMENT  1. Esbriet Medication Management  Patient counseled on purpose, proper use, and potential adverse effects including nausea, vomiting, abdominal pain, GERD, weight loss, arthralgia, dizziness, and suns sensitivity/rash.  Stressed the importance of routine lab monitoring.  We will monitor LFT's every 2 weeks for the first 6 months due to history of liver cirrhosis.  We will monitor CBC every 3 months.  Starting dose will be Esbriet 267 mg 1 tablet three times daily for 7 days, then 2 tablets three times daily due to history of liver cirrhosis. Stressed the importance of taking with meals to minimize stomach upset.    Patient has [place] Medicaid which does not require prior authorization for Esbriet.  She will fill at [facility].  Prescription sent to [facility] and first shipment set for Tuesday 09/05/2019.    2. Medication Reconciliation  A drug regimen assessment was performed, including review of allergies, interactions, disease-state management, dosing and immunization history. Medications were reviewed with the patient, including name, instructions, indication, goals of therapy, potential side effects, importance of adherence, and safe use.  Drug interaction(s): no major interactions identified  3. Immunizations  Patient is up to date with annual influenza, pneumovax 23 and COVID-19.  PLAN  Start Esbriet.  Shipment set for next Tuesday  Repeat hepatic panel in 2 weeks  All questions encouraged and answered.  Instructed patient to call with any further questions or concerns.  Thank you for allowing pharmacy to participate in this patient's care.  This appointment required  45 minutes of patient care (this includes precharting,  chart review, review of results, face-to-face care, etc.).   Mariella Saa, PharmD, [place], [place] Clinical Specialty Pharmacist [phone]  08/30/2019 3:55 PM

## 2019-08-30 NOTE — Telephone Encounter (Signed)
Patient made a televisit with Beth. Nothing else further needed.

## 2019-08-31 ENCOUNTER — Inpatient Hospital Stay (HOSPITAL_COMMUNITY)
Admission: EM | Admit: 2019-08-31 | Discharge: 2019-09-09 | DRG: 189 | Disposition: E | Payer: Medicaid Other | Attending: Family Medicine | Admitting: Family Medicine

## 2019-08-31 ENCOUNTER — Ambulatory Visit: Payer: Medicaid Other | Admitting: Physical Therapy

## 2019-08-31 ENCOUNTER — Emergency Department (HOSPITAL_COMMUNITY): Payer: Medicaid Other

## 2019-08-31 ENCOUNTER — Other Ambulatory Visit: Payer: Self-pay

## 2019-08-31 VITALS — BP 125/62 | HR 96

## 2019-08-31 DIAGNOSIS — I82452 Acute embolism and thrombosis of left peroneal vein: Secondary | ICD-10-CM | POA: Diagnosis present

## 2019-08-31 DIAGNOSIS — D72819 Decreased white blood cell count, unspecified: Secondary | ICD-10-CM

## 2019-08-31 DIAGNOSIS — I471 Supraventricular tachycardia: Secondary | ICD-10-CM | POA: Diagnosis present

## 2019-08-31 DIAGNOSIS — J189 Pneumonia, unspecified organism: Secondary | ICD-10-CM | POA: Diagnosis present

## 2019-08-31 DIAGNOSIS — N189 Chronic kidney disease, unspecified: Secondary | ICD-10-CM | POA: Diagnosis present

## 2019-08-31 DIAGNOSIS — Z823 Family history of stroke: Secondary | ICD-10-CM

## 2019-08-31 DIAGNOSIS — E039 Hypothyroidism, unspecified: Secondary | ICD-10-CM | POA: Diagnosis present

## 2019-08-31 DIAGNOSIS — Z7951 Long term (current) use of inhaled steroids: Secondary | ICD-10-CM

## 2019-08-31 DIAGNOSIS — Z7984 Long term (current) use of oral hypoglycemic drugs: Secondary | ICD-10-CM

## 2019-08-31 DIAGNOSIS — M1990 Unspecified osteoarthritis, unspecified site: Secondary | ICD-10-CM | POA: Diagnosis present

## 2019-08-31 DIAGNOSIS — R293 Abnormal posture: Secondary | ICD-10-CM

## 2019-08-31 DIAGNOSIS — J441 Chronic obstructive pulmonary disease with (acute) exacerbation: Secondary | ICD-10-CM | POA: Diagnosis present

## 2019-08-31 DIAGNOSIS — F411 Generalized anxiety disorder: Secondary | ICD-10-CM

## 2019-08-31 DIAGNOSIS — J440 Chronic obstructive pulmonary disease with acute lower respiratory infection: Secondary | ICD-10-CM | POA: Diagnosis present

## 2019-08-31 DIAGNOSIS — Z79890 Hormone replacement therapy (postmenopausal): Secondary | ICD-10-CM

## 2019-08-31 DIAGNOSIS — J84112 Idiopathic pulmonary fibrosis: Secondary | ICD-10-CM | POA: Diagnosis present

## 2019-08-31 DIAGNOSIS — E1122 Type 2 diabetes mellitus with diabetic chronic kidney disease: Secondary | ICD-10-CM | POA: Diagnosis present

## 2019-08-31 DIAGNOSIS — Z9981 Dependence on supplemental oxygen: Secondary | ICD-10-CM

## 2019-08-31 DIAGNOSIS — R0603 Acute respiratory distress: Secondary | ICD-10-CM

## 2019-08-31 DIAGNOSIS — K8309 Other cholangitis: Secondary | ICD-10-CM | POA: Diagnosis present

## 2019-08-31 DIAGNOSIS — Z79899 Other long term (current) drug therapy: Secondary | ICD-10-CM

## 2019-08-31 DIAGNOSIS — K219 Gastro-esophageal reflux disease without esophagitis: Secondary | ICD-10-CM | POA: Diagnosis present

## 2019-08-31 DIAGNOSIS — R0600 Dyspnea, unspecified: Secondary | ICD-10-CM

## 2019-08-31 DIAGNOSIS — D61818 Other pancytopenia: Secondary | ICD-10-CM | POA: Diagnosis present

## 2019-08-31 DIAGNOSIS — Z515 Encounter for palliative care: Secondary | ICD-10-CM | POA: Diagnosis present

## 2019-08-31 DIAGNOSIS — Z20822 Contact with and (suspected) exposure to covid-19: Secondary | ICD-10-CM | POA: Diagnosis present

## 2019-08-31 DIAGNOSIS — Z801 Family history of malignant neoplasm of trachea, bronchus and lung: Secondary | ICD-10-CM

## 2019-08-31 DIAGNOSIS — Z833 Family history of diabetes mellitus: Secondary | ICD-10-CM

## 2019-08-31 DIAGNOSIS — Z66 Do not resuscitate: Secondary | ICD-10-CM | POA: Diagnosis present

## 2019-08-31 DIAGNOSIS — K743 Primary biliary cirrhosis: Secondary | ICD-10-CM | POA: Diagnosis present

## 2019-08-31 DIAGNOSIS — K7460 Unspecified cirrhosis of liver: Secondary | ICD-10-CM

## 2019-08-31 DIAGNOSIS — E1165 Type 2 diabetes mellitus with hyperglycemia: Secondary | ICD-10-CM | POA: Diagnosis present

## 2019-08-31 DIAGNOSIS — Z90710 Acquired absence of both cervix and uterus: Secondary | ICD-10-CM

## 2019-08-31 DIAGNOSIS — Z8249 Family history of ischemic heart disease and other diseases of the circulatory system: Secondary | ICD-10-CM

## 2019-08-31 DIAGNOSIS — Z7952 Long term (current) use of systemic steroids: Secondary | ICD-10-CM

## 2019-08-31 DIAGNOSIS — J849 Interstitial pulmonary disease, unspecified: Secondary | ICD-10-CM

## 2019-08-31 DIAGNOSIS — F419 Anxiety disorder, unspecified: Secondary | ICD-10-CM | POA: Diagnosis present

## 2019-08-31 DIAGNOSIS — J9621 Acute and chronic respiratory failure with hypoxia: Principal | ICD-10-CM | POA: Diagnosis present

## 2019-08-31 DIAGNOSIS — R0609 Other forms of dyspnea: Secondary | ICD-10-CM

## 2019-08-31 DIAGNOSIS — E871 Hypo-osmolality and hyponatremia: Secondary | ICD-10-CM | POA: Diagnosis present

## 2019-08-31 DIAGNOSIS — I5033 Acute on chronic diastolic (congestive) heart failure: Secondary | ICD-10-CM | POA: Diagnosis present

## 2019-08-31 DIAGNOSIS — G40909 Epilepsy, unspecified, not intractable, without status epilepticus: Secondary | ICD-10-CM | POA: Diagnosis present

## 2019-08-31 LAB — BASIC METABOLIC PANEL WITH GFR
Anion gap: 7 (ref 5–15)
BUN: 11 mg/dL (ref 6–20)
CO2: 20 mmol/L — ABNORMAL LOW (ref 22–32)
Calcium: 8.1 mg/dL — ABNORMAL LOW (ref 8.9–10.3)
Chloride: 106 mmol/L (ref 98–111)
Creatinine, Ser: 0.64 mg/dL (ref 0.44–1.00)
GFR calc Af Amer: >60 mL/min
GFR calc non Af Amer: >60 mL/min
Glucose, Bld: 262 mg/dL — ABNORMAL HIGH (ref 70–99)
Potassium: 3.9 mmol/L (ref 3.5–5.1)
Sodium: 133 mmol/L — ABNORMAL LOW (ref 135–145)

## 2019-08-31 LAB — CBC WITH DIFFERENTIAL/PLATELET
Abs Immature Granulocytes: 0.02 10*3/uL (ref 0.00–0.07)
Basophils Absolute: 0 10*3/uL (ref 0.0–0.1)
Basophils Relative: 1 %
Eosinophils Absolute: 0 10*3/uL (ref 0.0–0.5)
Eosinophils Relative: 1 %
HCT: 26.3 % — ABNORMAL LOW (ref 36.0–46.0)
Hemoglobin: 7.7 g/dL — ABNORMAL LOW (ref 12.0–15.0)
Immature Granulocytes: 1 %
Lymphocytes Relative: 16 %
Lymphs Abs: 0.3 10*3/uL — ABNORMAL LOW (ref 0.7–4.0)
MCH: 24.6 pg — ABNORMAL LOW (ref 26.0–34.0)
MCHC: 29.3 g/dL — ABNORMAL LOW (ref 30.0–36.0)
MCV: 84 fL (ref 80.0–100.0)
Monocytes Absolute: 0.2 10*3/uL (ref 0.1–1.0)
Monocytes Relative: 11 %
Neutro Abs: 1.2 10*3/uL — ABNORMAL LOW (ref 1.7–7.7)
Neutrophils Relative %: 70 %
Platelets: 60 10*3/uL — ABNORMAL LOW (ref 150–400)
RBC: 3.13 MIL/uL — ABNORMAL LOW (ref 3.87–5.11)
RDW: 16 % — ABNORMAL HIGH (ref 11.5–15.5)
WBC: 1.7 10*3/uL — ABNORMAL LOW (ref 4.0–10.5)
nRBC: 0 % (ref 0.0–0.2)

## 2019-08-31 LAB — BRAIN NATRIURETIC PEPTIDE: B Natriuretic Peptide: 88.7 pg/mL (ref 0.0–100.0)

## 2019-08-31 LAB — TROPONIN I (HIGH SENSITIVITY): Troponin I (High Sensitivity): 4 ng/L

## 2019-08-31 LAB — POC SARS CORONAVIRUS 2 AG -  ED: SARS Coronavirus 2 Ag: NEGATIVE

## 2019-08-31 MED ORDER — SODIUM CHLORIDE 0.9 % IV SOLN
500.0000 mg | Freq: Once | INTRAVENOUS | Status: AC
  Administered 2019-08-31: 500 mg via INTRAVENOUS
  Filled 2019-08-31: qty 500

## 2019-08-31 MED ORDER — ALBUTEROL SULFATE HFA 108 (90 BASE) MCG/ACT IN AERS
4.0000 | INHALATION_SPRAY | Freq: Once | RESPIRATORY_TRACT | Status: AC
  Administered 2019-08-31: 4 via RESPIRATORY_TRACT
  Filled 2019-08-31: qty 6.7

## 2019-08-31 MED ORDER — ALBUTEROL SULFATE HFA 108 (90 BASE) MCG/ACT IN AERS
4.0000 | INHALATION_SPRAY | Freq: Once | RESPIRATORY_TRACT | Status: AC
  Administered 2019-09-01: 4 via RESPIRATORY_TRACT
  Filled 2019-08-31: qty 6.7

## 2019-08-31 MED ORDER — ALBUTEROL SULFATE (2.5 MG/3ML) 0.083% IN NEBU: 5.0000 mg | INHALATION_SOLUTION | Freq: Once | RESPIRATORY_TRACT | Status: DC

## 2019-08-31 MED ORDER — SODIUM CHLORIDE 0.9 % IV SOLN
1.0000 g | Freq: Once | INTRAVENOUS | Status: AC
  Administered 2019-08-31: 23:00:00 1 g via INTRAVENOUS
  Filled 2019-08-31: qty 10

## 2019-08-31 MED ORDER — IPRATROPIUM BROMIDE HFA 17 MCG/ACT IN AERS
2.0000 | INHALATION_SPRAY | Freq: Once | RESPIRATORY_TRACT | Status: AC
  Administered 2019-08-31: 23:00:00 2 via RESPIRATORY_TRACT
  Filled 2019-08-31: qty 12.9

## 2019-08-31 NOTE — ED Triage Notes (Signed)
Pt arrives via [facility] stretcher C/O SOB. On scene fire reported O2 was 87% on 4 lpm [place] home oxygen. [facility] reported diminished lung sounds with expiratory wheezes. Pt had taken one albuterol tx prior to their arrival. Tx with 2 g mag, 125 solumedrol, and duoneb without relief. Pt arrives on 15 lpm [facility].

## 2019-08-31 NOTE — Therapy (Addendum)
[place], [place], [address] Phone: [phone]   Fax:  [phone]  Physical Therapy Treatment  Patient Details  Name: Holly Mueller MRN: [id number] Date of Birth: [birth year] Referring Provider (PT): Zenia Resides, MD    Encounter Date: 08/21/2019  PT End of Session - 08/22/2019 1547    Visit Number  2    Number of Visits  17    Date for PT Re-Evaluation  10/19/19    PT Start Time  1501    PT Stop Time  1546    PT Time Calculation (min)  45 min    Activity Tolerance  Other (comment);Patient limited by fatigue;Patient tolerated treatment well    Behavior During Therapy  [facility] for tasks assessed/performed       Past Medical History:  Diagnosis Date  . Acute respiratory failure with hypoxia ([place]) 09/22/2018  . Anxiety    "anxiety attacks- sometimes"  . Arthritis    bursitis left hip flares-not an issue  . Chronic kidney disease   . Community acquired pneumonia 11/28/2018  . COPD (chronic obstructive pulmonary disease) ([place])   . Diabetes mellitus without complication ([place])    Type II  . Dysrhythmia   . Edentulous    10-19-13 at present  . Epilepsy ([place]) in 1972   No seizures since 1972. Previously treated with phenobarbital.   . Gastric varices with bleeding   . GERD (gastroesophageal reflux disease) 2008  . 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 Headache(784.0)    hx of migraines   . Heart murmur   . 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 History of blood transfusion    gi bleed  . History of kidney stones    passed  . Hypothyroidism 2008  . Interstitial lung disease ([place])   . Leukopenia   . Lower esophageal ring 08/18/2013  . Pneumonia   . Primary biliary cholangitis ([place]) 10/24/2018  . Seasonal allergies 2003  . SEIZURES, HX OF 12/12/2009   Annotation: last seizure in early 1970s Qualifier: Diagnosis  of  By: Carlena Sax  MD, Colletta [place]    . Shortness of breath    walking  . Wears glasses     Past Surgical History:  Procedure Laterality Date  . ABDOMINAL HYSTERECTOMY     " partial "  . BALLOON DILATION N/A 10/24/2013   Procedure: BALLOON DILATION;  Surgeon: Gatha Mayer, MD;  [facility]: [facility];  Service: Endoscopy;  Laterality: N/A;  . BIOPSY  09/26/2018   Procedure: BIOPSY;  Surgeon: [place] Park, MD;  Location: [place];  Service: Gastroenterology;;  . BIOPSY  01/09/2019   Procedure: BIOPSY;  Surgeon: Irving Copas., MD;  Location: [place];  Service: Gastroenterology;;  . CESAREAN SECTION     x2  . DENTAL SURGERY     multiple extractions 3'15  . DILATION AND CURETTAGE OF UTERUS    . ESOPHAGOGASTRODUODENOSCOPY N/A 10/24/2013   Procedure: ESOPHAGOGASTRODUODENOSCOPY (EGD);  Surgeon: Gatha Mayer, MD;  [facility];  Service: Endoscopy;  Laterality: N/A;  . ESOPHAGOGASTRODUODENOSCOPY N/A 01/09/2019   Procedure: ESOPHAGOGASTRODUODENOSCOPY (EGD);  Surgeon: Irving Copas., MD;  Location: [place];  Service: Gastroenterology;  Laterality: N/A;  . ESOPHAGOGASTRODUODENOSCOPY (EGD) WITH PROPOFOL N/A 09/26/2018   Procedure: ESOPHAGOGASTRODUODENOSCOPY (EGD) WITH PROPOFOL;  Surgeon: [place] Park, MD;  Location: [place];  Service: Gastroenterology;  Laterality: N/A;  .  ESOPHAGOGASTRODUODENOSCOPY (EGD) WITH PROPOFOL N/A 04/26/2019   Procedure: ESOPHAGOGASTRODUODENOSCOPY (EGD) WITH PROPOFOL;  Surgeon: Rush Landmark Telford Nab., MD;  Location: [facility];  Service: Gastroenterology;  Laterality: N/A;  . HEMOSTASIS CLIP PLACEMENT  04/26/2019   Procedure: HEMOSTASIS CLIP PLACEMENT;  Surgeon: Irving Copas., MD;  Location: [place] [facility];  Service: Gastroenterology;;  . IR PARACENTESIS  09/23/2018  . LIVER BIOPSY  06/30/2012   Procedure: LIVER BIOPSY;  Surgeon: Shann Medal, MD;  [facility];  Service: General;;  . NOVASURE ABLATION     . SCLEROTHERAPY  04/26/2019   Procedure: Clide Deutscher;  Surgeon: Mansouraty, Telford Nab., MD;  Location: [place];  Service: Gastroenterology;;  cyanoacrylate  . SHOULDER ARTHROSCOPY Right 2011  . UMBILICAL HERNIA REPAIR N/A 06/30/2012   Procedure: remove umbilicus;  Surgeon: Shann Medal, MD;  [facility];  Service: General;  Laterality: N/A;  . UPPER ESOPHAGEAL ENDOSCOPIC ULTRASOUND (EUS) N/A 01/09/2019   Procedure: UPPER ESOPHAGEAL ENDOSCOPIC ULTRASOUND (EUS);  Surgeon: Irving Copas., MD;  Location: [place];  Service: Gastroenterology;  Laterality: N/A;  . VENTRAL HERNIA REPAIR N/A 06/30/2012   Procedure: LAPAROSCOPIC VENTRAL HERNIA;  Surgeon: Shann Medal, MD;  [facility];  Service: General;  Laterality: N/A;  With Mesh  . VIDEO BRONCHOSCOPY Bilateral 07/20/2018   Procedure: VIDEO BRONCHOSCOPY WITHOUT FLUORO;  Surgeon: Brand Males, MD;  [facility];  Service: Cardiopulmonary;  Laterality: Bilateral;  . WISDOM TOOTH EXTRACTION      Vitals:   08/29/2019 1504 08/27/2019 1535  BP: 129/77 125/62  Pulse: 94 96  SpO2: 94% 92%    Subjective Assessment - 08/20/2019 1501    Subjective  "I saw the Md and they gave me prednisone due to issues with my breathing. My pulmonologist stated I need to keep my O2 at 4LPM." pt notes she isn't feeling to well today.         [facility] PT Assessment - 09/07/2019 0001      6 Minute Walk- Baseline   6 Minute Walk- Baseline  yes    BP (mmHg)  127/77    HR (bpm)  94    02 Sat (%RA)  94 %    Modified Borg Scale for Dyspnea  3- Moderate shortness of breath or breathing difficulty    Perceived Rate of Exertion (Borg)  11- Fairly light      6 Minute walk- Post Test   6 Minute Walk Post Test  yes    BP (mmHg)  148/80    HR (bpm)  111    02 Sat (%RA)  81 %    Modified Borg Scale for Dyspnea  7- Severe shortness of breath or very hard breathing    Perceived Rate of Exertion (Borg)  17- Very hard      6 minute walk  test results    Aerobic Endurance Distance Walked  71    Endurance additional comments  only able to tolerate 2 min walk tst                   [facility] Adult PT Treatment/Exercise - 08/30/2019 0001      Exercises   Exercises  Other Exercises;Knee/Hip    Other Exercises   seated black physioball shoulder flexion bil with pt inhaling with flexion and exhalling with extension      Knee/Hip Exercises: Aerobic   Nustep  L1 x 7 min while on 2 LPM   maintained O2 at 91-92% throughout Nu-step  Shoulder Exercises: Seated   Other Seated Exercises  shoulder abduction 1 x 12 focusing on inhaling with abduction and exhaling with relaxing pursed lip breahting             PT Education - 08/29/2019 1624    Education Details  reviewed HEP    Person(s) Educated  Patient    Methods  Explanation;Verbal cues    Comprehension  Verbalized understanding;Verbal cues required       PT Short Term Goals - 08/24/19 1243      PT SHORT TERM GOAL #1   Title  pt to be I with initial HEp    Baseline  no previous HEP    Time  3    Period  Weeks    Status  New    Target Date  09/21/19      PT SHORT TERM GOAL #2   Title  pt to verbalize/ demo proper posture and lifting mechanics to promote respiration and reduce fatigue    Baseline  no previous knowledge of good posture    Time  3    Period  Weeks    Status  New    Target Date  09/21/19        PT Long Term Goals - 08/24/19 1244      PT LONG TERM GOAL #1   Title  pt to be able to stand/ walk for >/= 20 min with LRAD using </= 2 LPM O2 mainting SPO2 to >/= 94% for improvement in function.    Baseline  standing/ walking 2 min pt desatted to 86% with 2LPM O2    Time  8    Period  Weeks    Status  New    Target Date  10/19/19      PT LONG TERM GOAL #2   Title  pt to be able to perform grocery shopping, in home and community ambulation and ADLS with </= 08/67  RPE and </=5/10 modified BORG scale    Baseline  inital BORG with walking  9/10, and RPE with 2 min walk 18/20    Time  8    Period  Weeks    Status  New    Target Date  10/19/19      PT LONG TERM GOAL #3   Title  pt to be I with all HEP given as of last visit to maintain and progress current level of function    Baseline  no previous HEP    Time  6    Period  Weeks    Status  New    Target Date  10/19/19      PT LONG TERM GOAL #4   Title  pt to be able to complete  6 min walk with </=2LPM O2 with </= 1 rest break  to demonstrate functional improvement    Baseline  unable to perofrm 6 min wlak test    Time  8    Period  Weeks    Status  New    Target Date  10/19/19            Plan - 08/11/2019 1619    Clinical Impression Statement  pt rpeorts consistency with her HEP. She notes she was prescribed prednisone by per pulomonologist to help with her breath. She was able to amb 71 ft during 2 min walk testing while on 2 LPM O2 with O2 % dropping to 81% and RPE at 17/20 and BORG 7/10. continued working on  aerobic [place] which she responded well to with O2 staying between 91-92%. continued working thoracic / bil UE mobility combined with breathing techniques. She noted feeling better following session.    PT Treatment/Interventions  ADLs/Self Care Home Management;Cryotherapy;Moist Heat;Neuromuscular re-education;Gait training;Stair training;Therapeutic activities;Functional mobility training;Therapeutic exercise;Balance training;Passive range of motion;Energy conservation;Taping    PT Next Visit Plan  review/ update HEP PRN,Nu-step, shoulder/ thoracic region opening, monitor O2/ BP, pt uses O2 set at 2 LPM, gradual work into endurnace/ Nu-step, monitor BORG/ RPE,    PT Home Exercise Plan  [phone] - seated marching, clamshells, pursed lipped breathing, with rib elevation/ trunk flexion/ extension    Consulted and Agree with Plan of Care  Patient       Patient will benefit from skilled therapeutic intervention in order to improve the following deficits and  impairments:  Pain, Decreased activity tolerance, Decreased endurance, Decreased knowledge of precautions, Postural dysfunction, Improper body mechanics, Cardiopulmonary status limiting activity  Visit Diagnosis: Abnormal posture  Dyspnea on exertion     Problem List Patient Active Problem List   Diagnosis Date Noted  . Acute bacterial sinusitis 08/17/2019  . Goals of care, counseling/discussion   . Palliative care by specialist   . Advanced care planning/counseling discussion   . Tachycardia 07/06/2019  . COPD exacerbation ([place]) 06/27/2019  . Interstitial pulmonary fibrosis ([place]) 06/26/2019  . Acute on chronic respiratory failure with hypoxemia ([place]) 06/26/2019  . It band syndrome, left 06/16/2019  . Rectal bleeding 06/16/2019  . On supplemental oxygen therapy   . Nodule of right lung   . Respiratory distress   . Community acquired pneumonia of left lower lobe of lung 05/22/2019  . Petechiae 05/01/2019  . Lower extremity edema 05/01/2019  . Irritant contact dermatitis due to detergent 03/31/2019  . Melena 03/31/2019  . Financial difficulties 03/31/2019  . Skin rash 03/17/2019  . Intertrigo 02/11/2019  . Dysuria 02/11/2019  . Chronic respiratory failure with hypoxia ([place]) 01/11/2019  . Inadequate housing 12/05/2018  . Primary biliary cholangitis ([place]) 10/24/2018  . Gastric varices with bleeding   . Anemia 09/22/2018  . Pancytopenia ([place]) 08/28/2018  . Does not have health insurance 04/04/2018  . ILD (interstitial lung disease) ([place]) 03/23/2018  . Type 2 diabetes mellitus without complication, without long-term current use of insulin ([place])   . 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 GERD (gastroesophageal reflux disease) 10/23/2016  . Chronic cough 12/09/2015  . Precordial chest pain 08/03/2015  . DOE (dyspnea on exertion) 08/17/2013  . Ganglion cyst of wrist 08/24/2012  . Right hip pain 08/23/2012  . Complex care coordination  02/14/2012  . Obesity 07/08/2010  . Shortness of breath 12/12/2009  . Depression 07/26/2008  . Hypothyroidism 10/12/2006  . Restrictive lung disease 07/30/2006   Starr Lake PT, DPT, LAT, ATC  08/19/2019  4:25 PM      [facility] [address] [place], [place], [address] Phone: [phone]   Fax:  [phone]  Name: Camala Talwar MRN: [id number] Date of Birth: 1966-01-04      PHYSICAL THERAPY DISCHARGE SUMMARY  Visits from Start of Care: 2  Current functional level related to goals / functional outcomes: See goals,    Remaining deficits: Pt did not return   Education / Equipment: HEP  Plan: Patient agrees to discharge.  Patient goals were not met. Patient is being discharged due to not returning since the last visit.  ?????  Oliver Heitzenrater PT, DPT, LAT, ATC  09/26/19  1:03 PM

## 2019-08-31 NOTE — ED Provider Notes (Addendum)
[facility] EMERGENCY DEPARTMENT Provider Note   CSN: [id number] Arrival date & time: 08/11/2019  2100     History Chief Complaint  Patient presents with  . Shortness of Breath    Holly Mueller is a 54 y.o. female.  Past medical history COPD on 2 L nasal cannula chronically, diabetes, CKD presented to ER with complaint of shortness of breath.  Patient reports over the past week she has had increased difficulty breathing, no changes in her chronic cough, no fever.  States that using her albuterol treatments at home with some improvement.  Had significant improvement after receiving albuterol treatment with EMS.  Additionally given magnesium, Solu-Medrol.  Reports that today she had some associated chest tightness, not having any ongoing pain at this time.  HPI     Past Medical History:  Diagnosis Date  . Acute respiratory failure with hypoxia ([place]) 09/22/2018  . Anxiety    "anxiety attacks- sometimes"  . Arthritis    bursitis left hip flares-not an issue  . Chronic kidney disease   . Community acquired pneumonia 11/28/2018  . COPD (chronic obstructive pulmonary disease) ([place])   . Diabetes mellitus without complication ([place])    Type II  . Dysrhythmia   . Edentulous    10-19-13 at present  . Epilepsy ([place]) in 1972   No seizures since 1972. Previously treated with phenobarbital.   . Gastric varices with bleeding   . GERD (gastroesophageal reflux disease) 2008  . 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 Headache(784.0)    hx of migraines   . Heart murmur   . 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 History of blood transfusion    gi bleed  . History of kidney stones    passed  . Hypothyroidism 2008  . Interstitial lung disease ([place])   . Leukopenia   . Lower esophageal ring 08/18/2013  . Pneumonia   . Primary biliary cholangitis ([place]) 10/24/2018  . Seasonal allergies 2003  .  SEIZURES, HX OF 12/12/2009   Annotation: last seizure in early 1970s Qualifier: Diagnosis of  By: Carlena Sax  MD, Colletta [place]    . Shortness of breath    walking  . Wears glasses     Patient Active Problem List   Diagnosis Date Noted  . Acute bacterial sinusitis 08/17/2019  . Goals of care, counseling/discussion   . Palliative care by specialist   . Advanced care planning/counseling discussion   . Tachycardia 07/06/2019  . COPD exacerbation ([place]) 06/27/2019  . Interstitial pulmonary fibrosis ([place]) 06/26/2019  . Acute on chronic respiratory failure with hypoxemia ([place]) 06/26/2019  . It band syndrome, left 06/16/2019  . Rectal bleeding 06/16/2019  . On supplemental oxygen therapy   . Nodule of right lung   . Respiratory distress   . Community acquired pneumonia of left lower lobe of lung 05/22/2019  . Petechiae 05/01/2019  . Lower extremity edema 05/01/2019  . Irritant contact dermatitis due to detergent 03/31/2019  . Melena 03/31/2019  . Financial difficulties 03/31/2019  . Skin rash 03/17/2019  . Intertrigo 02/11/2019  . Dysuria 02/11/2019  . Chronic respiratory failure with hypoxia ([place]) 01/11/2019  . Inadequate housing 12/05/2018  . Primary biliary cholangitis ([place]) 10/24/2018  . Gastric varices with bleeding   . Anemia 09/22/2018  . Pancytopenia ([place]) 08/28/2018  . Does not have health insurance 04/04/2018  . ILD (interstitial lung disease) ([place]) 03/23/2018  .  Type 2 diabetes mellitus without complication, without long-term current use of insulin ([place])   . 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 GERD (gastroesophageal reflux disease) 10/23/2016  . Chronic cough 12/09/2015  . Precordial chest pain 08/03/2015  . DOE (dyspnea on exertion) 08/17/2013  . Ganglion cyst of wrist 08/24/2012  . Right hip pain 08/23/2012  . Complex care coordination 02/14/2012  . Obesity 07/08/2010  . Shortness of breath 12/12/2009  . Depression 07/26/2008  .  Hypothyroidism 10/12/2006  . Restrictive lung disease 07/30/2006    Past Surgical History:  Procedure Laterality Date  . ABDOMINAL HYSTERECTOMY     " partial "  . BALLOON DILATION N/A 10/24/2013   Procedure: BALLOON DILATION;  Surgeon: Gatha Mayer, MD;  Location: [facility];  Service: Endoscopy;  Laterality: N/A;  . BIOPSY  09/26/2018   Procedure: BIOPSY;  Surgeon: Thornton Park, MD;  Location: [place];  Service: Gastroenterology;;  . BIOPSY  01/09/2019   Procedure: BIOPSY;  Surgeon: Irving Copas., MD;  Location: [place];  Service: Gastroenterology;;  . CESAREAN SECTION     x2  . DENTAL SURGERY     multiple extractions 3'15  . DILATION AND CURETTAGE OF UTERUS    . ESOPHAGOGASTRODUODENOSCOPY N/A 10/24/2013   Procedure: ESOPHAGOGASTRODUODENOSCOPY (EGD);  Surgeon: Gatha Mayer, MD;  Location: [facility];  Service: Endoscopy;  Laterality: N/A;  . ESOPHAGOGASTRODUODENOSCOPY N/A 01/09/2019   Procedure: ESOPHAGOGASTRODUODENOSCOPY (EGD);  Surgeon: Irving Copas., MD;  Location: [place];  Service: Gastroenterology;  Laterality: N/A;  . ESOPHAGOGASTRODUODENOSCOPY (EGD) WITH PROPOFOL N/A 09/26/2018   Procedure: ESOPHAGOGASTRODUODENOSCOPY (EGD) WITH PROPOFOL;  Surgeon: Thornton Park, MD;  Location: [place];  Service: Gastroenterology;  Laterality: N/A;  . ESOPHAGOGASTRODUODENOSCOPY (EGD) WITH PROPOFOL N/A 04/26/2019   Procedure: ESOPHAGOGASTRODUODENOSCOPY (EGD) WITH PROPOFOL;  Surgeon: Rush Landmark Telford Nab., MD;  Location: [place];  Service: Gastroenterology;  Laterality: N/A;  . HEMOSTASIS CLIP PLACEMENT  04/26/2019   Procedure: HEMOSTASIS CLIP PLACEMENT;  Surgeon: Irving Copas., MD;  Location: [place];  Service: Gastroenterology;;  . IR PARACENTESIS  09/23/2018  . LIVER BIOPSY  06/30/2012   Procedure: LIVER BIOPSY;  Surgeon: Shann Medal, MD;  [facility];  Service: General;;  . NOVASURE ABLATION    . SCLEROTHERAPY   04/26/2019   Procedure: Clide Deutscher;  Surgeon: Mansouraty, Telford Nab., MD;  Location: [place];  Service: Gastroenterology;;  cyanoacrylate  . SHOULDER ARTHROSCOPY Right 2011  . UMBILICAL HERNIA REPAIR N/A 06/30/2012   Procedure: remove umbilicus;  Surgeon: Shann Medal, MD;  [facility];  Service: General;  Laterality: N/A;  . UPPER ESOPHAGEAL ENDOSCOPIC ULTRASOUND (EUS) N/A 01/09/2019   Procedure: UPPER ESOPHAGEAL ENDOSCOPIC ULTRASOUND (EUS);  Surgeon: Irving Copas., MD;  Location: [place];  Service: Gastroenterology;  Laterality: N/A;  . VENTRAL HERNIA REPAIR N/A 06/30/2012   Procedure: LAPAROSCOPIC VENTRAL HERNIA;  Surgeon: Shann Medal, MD;  [facility];  Service: General;  Laterality: N/A;  With Mesh  . VIDEO BRONCHOSCOPY Bilateral 07/20/2018   Procedure: VIDEO BRONCHOSCOPY WITHOUT FLUORO;  Surgeon: Brand Males, MD;  [facility];  Service: Cardiopulmonary;  Laterality: Bilateral;  . WISDOM TOOTH EXTRACTION       OB History   No obstetric history on file.     Family History  Problem Relation Age of Onset  . Hypertension Mother   . Diabetes Mother   . Cirrhosis Mother        ? medications  . Lung cancer  Father   . Stroke Father   . Diabetes Sister   . Hypertension Sister   . Colon cancer Neg Hx   . Esophageal cancer Neg Hx   . Rectal cancer Neg Hx     Social History   Tobacco Use  . Smoking status: Never Smoker  . Smokeless tobacco: Never Used  . Tobacco comment: 04/25/2019- "I smoke part of a cigarette and put it out,I did not like it."  Substance Use Topics  . Alcohol use: Not Currently  . Drug use: No    Home Medications Prior to Admission medications   Medication Sig Start Date End Date Taking? Authorizing Provider  Acetaminophen (APAP) 325 MG tablet Take 2 tablets (650 mg total) by mouth every 6 (six) hours as needed. Patient taking differently: Take 650 mg by mouth every 6 (six) hours as needed for pain (or  headaches).  05/27/19  Yes Bonnita Hollow, MD  albuterol (PROAIR HFA) 108 (90 Base) MCG/ACT inhaler INHALE 2 PUFFS INTO THE LUNGS EVERY 6 HOURS AS NEEDED FOR SHORTNESS OF BREATH Patient taking differently: Inhale 2 puffs into the lungs every 4 (four) hours as needed for wheezing or shortness of breath.  04/25/18  Yes Rory Percy, DO  albuterol (PROVENTIL) (2.5 MG/3ML) 0.083% nebulizer solution Take 3 mLs (2.5 mg total) by nebulization every 6 (six) hours as needed for wheezing or shortness of breath. 04/10/19  Yes Brand Males, MD  AMBULATORY NON FORMULARY MEDICATION Nitroglycerin oint. 0.125% Apply a small pea size amount BID for 6-8 weeks Patient taking differently: Place 1 application rectally See admin instructions. Nitroglycerin oint. 0.125%: Apply a small pea-sized amount rectally as directed two times a day for 6-8 weeks 07/10/19  Yes Gatha Mayer, MD  benzonatate (TESSALON) 200 MG capsule Take 1 capsule (200 mg total) by mouth 3 (three) times daily as needed for cough. 08/28/19  Yes Martyn Ehrich, NP  budesonide-formoterol (SYMBICORT) 80-4.5 MCG/ACT inhaler Inhale 2 puffs into the lungs 2 (two) times daily for 1 day. 01/11/19 03/08/20 Yes Lauraine Rinne, NP  cetirizine (ZYRTEC) 10 MG tablet Take 1 tablet (10 mg total) by mouth daily. Patient taking differently: Take 10 mg by mouth at bedtime.  07/19/19  Yes Meccariello, Bernita Raisin, DO  dextromethorphan-guaiFENesin (MUCINEX DM) 30-600 MG 12hr tablet Take 1-2 tablets by mouth at bedtime as needed for cough.   Yes [provider]  diclofenac Sodium (VOLTAREN) 1 % GEL Apply 4 g topically 4 (four) times daily as needed (pain). 05/27/19  Yes Brimage, Vondra, DO  fluticasone (FLONASE) 50 MCG/ACT nasal spray Place 2 sprays into both nostrils in the morning and at bedtime.    Yes [provider]  levothyroxine (SYNTHROID) 125 MCG tablet Take 1 tablet (125 mcg total) by mouth 2 (two) times daily. Patient taking differently:  Take 250 mcg by mouth daily before breakfast.  07/19/19  Yes Meccariello, Bernita Raisin, DO  metFORMIN (GLUCOPHAGE) 500 MG tablet Take 1 tablet (500 mg total) by mouth 2 (two) times daily with a meal. 12/06/18  Yes Meccariello, Bernita Raisin, DO  OXYGEN Inhale 4 L/min into the lungs continuous.    Yes [provider]  pantoprazole (PROTONIX) 40 MG tablet Take 1 tablet (40 mg total) by mouth 2 (two) times daily before a meal. 10/17/18  Yes Glenis Smoker, MD  predniSONE (DELTASONE) 10 MG tablet Take 4 tabs po daily x 3 days; then 3 tabs daily x3 days; then 2 tabs daily x3 days; then 1  tab daily x 3 days; then stop Patient taking differently: Take 10-40 mg by mouth See admin instructions. Take 40 mg by mouth once daily for 3 days, 30 mg once daily for 3 days, 20 mg once daily for 3 days, and 10 mg once daily for 3 days- then, stop 08/28/19  Yes Martyn Ehrich, NP  SPIRIVA HANDIHALER 18 MCG inhalation capsule INHALE 1 CAPSULE VIA HANDIHALER ONCE DAILY AT [place] Patient taking differently: Place 18 mcg into inhaler and inhale daily.  01/09/19  Yes Meccariello, Bernita Raisin, DO  ursodiol (ACTIGALL) 300 MG capsule Take 300 mg by mouth in the morning, at noon, and at bedtime.   Yes [provider]  Accu-Chek Softclix Lancets lancets Use with three times daily with meals and at night. E11.9 07/20/19   Meccariello, Bernita Raisin, DO  blood glucose meter kit and supplies Dispense based on patient and insurance preference. Use up to four times daily as directed. (FOR ICD-10 E10.9, E11.9). 09/02/18   Dessa Phi, DO  Blood Glucose Monitoring Suppl (ACCU-CHEK GUIDE) w/Device KIT 1 Device by Does not apply route as directed. Use with three times daily with meals and at night. E11.9 07/20/19   Meccariello, Bernita Raisin, DO  glucose blood (ACCU-CHEK GUIDE) test strip Use with three times daily with meals and at night. E11.9 07/20/19   Meccariello, Bernita Raisin, DO  Lancets Misc. (ACCU-CHEK SOFTCLIX LANCET  DEV) KIT Use with three times daily with meals and at night. E11.9 07/20/19   Meccariello, Bernita Raisin, DO  Pirfenidone (ESBRIET) 267 MG TABS Take 1 tablet (267 mg total) by mouth 3 (three) times daily with meals for 7 days, THEN 2 tablets (534 mg total) 3 (three) times daily with meals for 23 days. 08/30/19 09/29/19  Yopp, Amber C, RPH-CPP    Allergies    Aspirin  Review of Systems   Review of Systems  Physical Exam Updated Vital Signs BP 135/66   Pulse 98   Temp 98 F (36.7 C) (Axillary)   Resp (!) 26   Ht _0  (1.676 m)   Wt 111.1 kg   LMP  (LMP Unknown)   SpO2 93%   BMI 39.54 kg/m   Physical Exam  ED Results / Procedures / Treatments   Labs (all labs ordered are listed, but only abnormal results are displayed) Labs Reviewed  CBC WITH DIFFERENTIAL/PLATELET - Abnormal; Notable for the following components:      Result Value   WBC 1.7 (*)    RBC 3.13 (*)    Hemoglobin 7.7 (*)    HCT 26.3 (*)    MCH 24.6 (*)    MCHC 29.3 (*)    RDW 16.0 (*)    Platelets 60 (*)    Neutro Abs 1.2 (*)    Lymphs Abs 0.3 (*)    All other components within normal limits  BASIC METABOLIC PANEL - Abnormal; Notable for the following components:   Sodium 133 (*)    CO2 20 (*)    Glucose, Bld 262 (*)    Calcium 8.1 (*)    All other components within normal limits  RESPIRATORY PANEL BY RT PCR (FLU A&B, COVID)  BRAIN NATRIURETIC PEPTIDE  POC SARS CORONAVIRUS 2 AG -  ED  TROPONIN I (HIGH SENSITIVITY)  TROPONIN I (HIGH SENSITIVITY)    EKG None  Radiology DG Chest 2 View  Result Date: 09/07/2019 CLINICAL DATA:  Shortness of breath with exertion for 1 week, central chest pain  EXAM: CHEST - 2 VIEW COMPARISON:  CT 07/14/2019, radiograph 07/13/2019 FINDINGS: Lung volumes are diminished. There are diffuse mixed hazy interstitial and more patchy consolidative opacity throughout both lungs. No pneumothorax or visible effusion. Cardiomediastinal contours are similar to prior with borderline  cardiomegaly. No acute osseous or soft tissue abnormality. Degenerative changes are present in the imaged spine and shoulders. Telemetry leads overlie the chest. IMPRESSION: Bilateral mixed interstitial and multifocal patchy consolidative opacity throughout both lungs. Findings may reflect multifocal pneumonia versus mixed interstitial and alveolar edema. Electronically Signed   By: Lovena Le M.D.   On: 08/27/2019 21:49    Procedures .Critical Care Performed by: Lucrezia Starch, MD Authorized by: Lucrezia Starch, MD   Critical care provider statement:    Critical care time (minutes):  40   Critical care was necessary to treat or prevent imminent or life-threatening deterioration of the following conditions:  Respiratory failure   Critical care was time spent personally by me on the following activities:  Discussions with consultants, evaluation of patient's response to treatment, examination of patient, ordering and performing treatments and interventions, ordering and review of laboratory studies, ordering and review of radiographic studies, pulse oximetry, re-evaluation of patient's condition, obtaining history from patient or surrogate and review of old charts   (including critical care time)  Medications Ordered in ED Medications  albuterol (VENTOLIN HFA) 108 (90 Base) MCG/ACT inhaler 4 puff (has no administration in time range)  azithromycin (ZITHROMAX) 500 mg in sodium chloride 0.9 % 250 mL IVPB (500 mg Intravenous New Bag/Given 08/23/2019 2323)  ipratropium (ATROVENT HFA) inhaler 2 puff (2 puffs Inhalation Given 08/14/2019 2240)  cefTRIAXone (ROCEPHIN) 1 g in sodium chloride 0.9 % 100 mL IVPB (0 g Intravenous Stopped 09/01/2019 2309)  albuterol (VENTOLIN HFA) 108 (90 Base) MCG/ACT inhaler 4 puff (4 puffs Inhalation Given 08/20/2019 2241)    ED Course  I have reviewed the triage vital signs and the nursing notes.  Pertinent labs & imaging results that were available during my care of  the patient were reviewed by me and considered in my medical decision making (see chart for details).  Clinical Course as of Aug 31 2342  Thu Aug 31, 2019  2342 Discussed with family medicine, they will admit   [RD]    Clinical Course User Index [RD] Lucrezia Starch, MD   MDM Rules/Calculators/A&P                      54 year old female presents to ER with shortness of breath. Prior to arrival received mag, Solu-Medrol, DuoNeb. Here patient still noted to have some tachypnea and wheezing. Provide additional albuterol, ipratropium. Had improvement but still somewhat tachypneic, still requiring 4 L nasal cannula. CXR with concern for pneumonia versus edema. Suspect most likely pneumonia, COPD exacerbation. EKG without ischemic changes, troponin of the normal limits, doubt ACS. Believe patient would benefit from inpatient admission for further observation management.  Reviewed case with family practice team who will come admit patient.  Final Clinical Impression(s) / ED Diagnoses Final diagnoses:  COPD exacerbation ([place])  Community acquired pneumonia, unspecified laterality    Rx / DC Orders ED Discharge Orders    None       Lucrezia Starch, MD 08/14/2019 2345    Lucrezia Starch, MD 08/30/2019 2356

## 2019-08-31 NOTE — ED Notes (Signed)
Pt taken to xray 

## 2019-08-31 NOTE — H&P (Addendum)
[facility] Admission History and Physical Service Pager: [phone]  Patient name: Holly Mueller Medical record number: [id number] Date of birth: 01/21/1966 Age: 54 y.o. Gender: female  Primary Care Provider: Cleophas Dunker, DO Consultants: None Code Status: Full Code  Preferred Emergency Contact:  Contact Information    Name Relation Home Work Mobile   Holly Mueller [phone]   Holly Mueller [phone]  [phone]   Holly Mueller Sister   [phone]     Chief Complaint: SOB & DOE   Assessment and Plan: [name] is a 54 y.o. female presenting with SOB secondary to exacerbation of her chronic lung disease. 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  Acute on chronic hypoxic respiratory failure concerning for CAP vs COPD Exacerbation Patient presents to ED with complaint of 10 days of worsening SOB and DOE. Earlier this week started on prednisone and told to increase supp O2 to 4L by her pulmonologist in response to this. Exam significant for diffuse inspiratory crackles, 1+ b/l LE edema, cough, increased WOB. Pt tachypneic and satting low 90s on 4L. Labs/imaging significant for chronic pancytopenia, BNP 88.7, trops 4,5, CXR showing b/l multifocal opacities reflecting PNA vs mixed interstital/alveolar edema. DDx includes progression of her chronic lung disease vs acute exacerbation.  Possible infectious trigger for exacerbation afebrile and CXR similar to previous CXRs.   -admit to medical tele, attending Dr. Ardelia Mems  -continuous pulse oximetry  -A.m. CBC with differential -A.m. CMP -Cardiac monitoring -f/u Procalcitonin -DuoNeb every 4 hours -Continue albuterol every 2 hours as needed -Albuterol 4 puffs daily -Mucinex DM 1-2 tablets  at bedtime as needed if -Continue Flonase -Continue home Incruse ellipta -Breo Ellipta/substitute for Symbicort -Tylenol as needed -Vital signs per unit - PT  Diarrhea, acute  Patient reports having diarrhea for the last few days after returning from vacation. She denies any other household members with the same symptoms. Denis hematochezia. No recent abx prescriptions that would raise suspicion for c diff. Patient may not be able to amount leukocytosis given chronic leukopenia, WBC today 1.7 from 1.1 on prior admission in February; patient afebrile. No changes in appetite, no nausea or emesis nor abdminal pain. DDx includes: viral gastroenteritis, med changes, dietary changes.  Will monitor and encourage PO fluid intake for now - consider GI PCR panel if >6 bowel movements/day  - encourage oral fluid intake   Hyponatremia, corrected for hyperglycemia  Na on admission low at 133, 136 when corrected for hyperglycemia of 262. Na on previous admissions WNL.  -monitor with CMP   DM Last hemoglobin A1c in January 2021 was 5.5.  Patient on Metformin 500 mg twice daily. Hgb A1c on admission elevated at 6.4. BG 262 on admission. Likely worse d/t recent steroid use.   -We will hold Metformin while hospitalized -Sensitive sliding scale insulin -CBG monitoring with meals and at bedtime  Hepatic cirrhosis secondary to primary biliary cholangitis This is chronic and stable condition for the patient.  Patient on home ursodiol. No RUQ pain on admission. Abdominal ultrasound in 04/2019 consistent with hepatic cirrhosis and unable to provide clear image for hepatic lesions.  -We will monitor hepatic enzymes with CMP -Continue home Actigall 300 mg 3 times daily  Hypothyroidism Patient on Synthroid 250 mcg as outpatient. -Continue home Synthroid  Chronic pancytopenia Follows with heme/onc as outpatient for this chronic problem.  White blood cell  count 1.7 on admission, Hgb 7.7, plt 60k, consistent with  previous values. . -monitor with CBC and differential  -monitor for signs of infection, fever curve and worsening cough/respiratory status   GERD Continue home Protonix 6m  FEN/GI: Carbohydrate modified diet  Prophylaxis: Lovenox  Disposition: Admit to medical telemetry  History of Present Illness:  Holly Mueller a 54y.o. female presenting with SOB and increased oxygen requirement.   She has had worsening SOB since she got back from the beach about 10 days ago.  She has Mueller DOE, difficulty ambulating to the bathroom, patient uses a walker to assist with ambulation.  She spoke with her pulmonologist who told her to increase home O2 to 4L from 3L last week. When the patient ambulates she desats. She states that her home pulse ox reads between 89-93% when resting, and decreases to 70s-80s when ambulating. Lying flat makes her Mueller short of breath. Sometimes she can't get to sleep until 5 or 6 in the am due to feeling as if she is gasping for air. She reports that propping herself up makes it slightly better.  No chest pain but 'a little pressure like having cold in chest'.  Her cough is dry. She coughs when talking or walking.  She uses a rolling walker to ambulate. She has been having watery diarrhea off/on for past 3 days.  She denies fever/chills.  No abdominal pain. No sick contacts she is aware of. She just received her 2nd covid vaccine last weekend.  [employer].  She still wears her mask when going outside. She had some dysuria at the beach for about 5 hours, but not since that time. No changes in appetite.    Uses nebulizer 3-4 times a day, symbicort 2 puff bid, albuterol inhaler 3-4 times a day.  Started on prednisone since Tuesday. tesselon doesn't help.  Hasn't started her new pulm medication yet.    Lives with sister and Mueller.     ED course: Upon arrival via EMS, patient had oxygen sats of 87% on 4 liters [place]. In the ED, she was treated with 2 grams mag & 1265msolumedrol,  patient was afebrile and respiratory rate was in the low 30s.  Patient received ceftriaxone azithromycin with concern for chest x-ray findings of bilateral mixed interstitial and multifocal patchy consolidative opacity throughout both lungs that may reflect multifocal pneumonia versus mixed interstitial and alveolar edema.   Review Of Systems: Per HPI with the following additions:   Review of Systems  Constitutional: Positive for fatigue. Negative for appetite change, chills and fever.  HENT: Negative for sore throat.   Respiratory: Positive for cough, chest tightness and shortness of breath.   Cardiovascular: Positive for leg swelling. Negative for chest pain.       Orthopnea  Gastrointestinal: Positive for diarrhea. Negative for abdominal pain, nausea and vomiting.  Genitourinary: Negative for difficulty urinating.  Neurological: Positive for light-headedness.     Patient Active Problem List   Diagnosis Date Noted  . Acute bacterial sinusitis 08/17/2019  . Goals of care, counseling/discussion   . Palliative care by specialist   . Advanced care planning/counseling discussion   . Tachycardia 07/06/2019  . COPD exacerbation ([facility]02/16/2021  . Interstitial pulmonary fibrosis (HC[place]02/15/2021  . Acute on chronic respiratory failure with hypoxemia (HC[place]02/15/2021  . It band syndrome, left 06/16/2019  . Rectal bleeding 06/16/2019  . On supplemental oxygen therapy   . Nodule of right lung   . Respiratory distress   .  Community acquired pneumonia of left lower lobe of lung 05/22/2019  . Petechiae 05/01/2019  . Lower extremity edema 05/01/2019  . Irritant contact dermatitis due to detergent 03/31/2019  . Melena 03/31/2019  . Financial difficulties 03/31/2019  . Skin rash 03/17/2019  . Intertrigo 02/11/2019  . Dysuria 02/11/2019  . Chronic respiratory failure with hypoxia ([place]) 01/11/2019  . Inadequate housing 12/05/2018  . Community acquired pneumonia 11/28/2018  . Primary biliary  cholangitis ([place]) 10/24/2018  . Gastric varices with bleeding   . Anemia 09/22/2018  . Chronic leukopenia 08/28/2018  . Does not have health insurance 04/04/2018  . ILD (interstitial lung disease) ([place]) 03/23/2018  . Type 2 diabetes mellitus without complication, without long-term current use of insulin ([place])   . 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 ([place]) 11/17/2016  . GERD (gastroesophageal reflux disease) 10/23/2016  . Chronic cough 12/09/2015  . Precordial chest pain 08/03/2015  . DOE (dyspnea on exertion) 08/17/2013  . Ganglion cyst of wrist 08/24/2012  . Right hip pain 08/23/2012  . Complex care coordination 02/14/2012  . Obesity 07/08/2010  . Shortness of breath 12/12/2009  . Depression 07/26/2008  . Hypothyroidism 10/12/2006  . Restrictive lung disease 07/30/2006    Past Medical History: Past Medical History:  Diagnosis Date  . Acute respiratory failure with hypoxia ([place]) 09/22/2018  . Anxiety    "anxiety attacks- sometimes"  . Arthritis    bursitis left hip flares-not an issue  . Chronic kidney disease   . Community acquired pneumonia 11/28/2018  . COPD (chronic obstructive pulmonary disease) ([place])   . Diabetes mellitus without complication ([place])    Type II  . Dysrhythmia   . Edentulous    10-19-13 at present  . Epilepsy ([place]) in 1972   No seizures since 1972. Previously treated with phenobarbital.   . Gastric varices with bleeding   . GERD (gastroesophageal reflux disease) 2008  . 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 Headache(784.0)    hx of migraines   . Heart murmur   . 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 ([place])   . History of blood transfusion    gi bleed  . History of kidney stones    passed  . Hypothyroidism 2008  . Interstitial lung disease ([place])   . Leukopenia   . Lower esophageal ring 08/18/2013  . Pneumonia   . Primary biliary cholangitis ([place]) 10/24/2018  .  Seasonal allergies 2003  . SEIZURES, HX OF 12/12/2009   Annotation: last seizure in early 1970s Qualifier: Diagnosis of  By: Carlena Sax  MD, Colletta [place]    . Shortness of breath    walking  . Wears glasses     Past Surgical History: Past Surgical History:  Procedure Laterality Date  . ABDOMINAL HYSTERECTOMY     " partial "  . BALLOON DILATION N/A 10/24/2013   Procedure: BALLOON DILATION;  Surgeon: Gatha Mayer, MD;  Location: [facility];  Service: Endoscopy;  Laterality: N/A;  . BIOPSY  09/26/2018   Procedure: BIOPSY;  Surgeon: Thornton Park, MD;  Location: [place];  Service: Gastroenterology;;  . BIOPSY  01/09/2019   Procedure: BIOPSY;  Surgeon: Irving Copas., MD;  Location: [place];  Service: Gastroenterology;;  . CESAREAN SECTION     x2  . DENTAL SURGERY     multiple extractions 3'15  . DILATION AND CURETTAGE OF UTERUS    . ESOPHAGOGASTRODUODENOSCOPY N/A 10/24/2013   Procedure: ESOPHAGOGASTRODUODENOSCOPY (  EGD);  Surgeon: Gatha Mayer, MD;  [facility];  Service: Endoscopy;  Laterality: N/A;  . ESOPHAGOGASTRODUODENOSCOPY N/A 01/09/2019   Procedure: ESOPHAGOGASTRODUODENOSCOPY (EGD);  Surgeon: Irving Copas., MD;  Location: [place];  Service: Gastroenterology;  Laterality: N/A;  . ESOPHAGOGASTRODUODENOSCOPY (EGD) WITH PROPOFOL N/A 09/26/2018   Procedure: ESOPHAGOGASTRODUODENOSCOPY (EGD) WITH PROPOFOL;  Surgeon: Thornton Park, MD;  Location: [place];  Service: Gastroenterology;  Laterality: N/A;  . ESOPHAGOGASTRODUODENOSCOPY (EGD) WITH PROPOFOL N/A 04/26/2019   Procedure: ESOPHAGOGASTRODUODENOSCOPY (EGD) WITH PROPOFOL;  Surgeon: Rush Landmark Telford Nab., MD;  Location: [place];  Service: Gastroenterology;  Laterality: N/A;  . HEMOSTASIS CLIP PLACEMENT  04/26/2019   Procedure: HEMOSTASIS CLIP PLACEMENT;  Surgeon: Irving Copas., MD;  Location: [place];  Service: Gastroenterology;;  . IR PARACENTESIS  09/23/2018  . LIVER  BIOPSY  06/30/2012   Procedure: LIVER BIOPSY;  Surgeon: Shann Medal, MD;  [facility];  Service: General;;  . NOVASURE ABLATION    . SCLEROTHERAPY  04/26/2019   Procedure: Clide Deutscher;  Surgeon: Mansouraty, Telford Nab., MD;  Location: [place];  Service: Gastroenterology;;  cyanoacrylate  . SHOULDER ARTHROSCOPY Right 2011  . UMBILICAL HERNIA REPAIR N/A 06/30/2012   Procedure: remove umbilicus;  Surgeon: Shann Medal, MD;  [facility];  Service: General;  Laterality: N/A;  . UPPER ESOPHAGEAL ENDOSCOPIC ULTRASOUND (EUS) N/A 01/09/2019   Procedure: UPPER ESOPHAGEAL ENDOSCOPIC ULTRASOUND (EUS);  Surgeon: Irving Copas., MD;  Location: [place];  Service: Gastroenterology;  Laterality: N/A;  . VENTRAL HERNIA REPAIR N/A 06/30/2012   Procedure: LAPAROSCOPIC VENTRAL HERNIA;  Surgeon: Shann Medal, MD;  [facility];  Service: General;  Laterality: N/A;  With Mesh  . VIDEO BRONCHOSCOPY Bilateral 07/20/2018   Procedure: VIDEO BRONCHOSCOPY WITHOUT FLUORO;  Surgeon: Brand Males, MD;  [facility];  Service: Cardiopulmonary;  Laterality: Bilateral;  . WISDOM TOOTH EXTRACTION      Social History: Social History   Tobacco Use  . Smoking status: Never Smoker  . Smokeless tobacco: Never Used  . Tobacco comment: 04/25/2019- "I smoke part of a cigarette and put it out,I did not like it."  Substance Use Topics  . Alcohol use: Not Currently  . Drug use: No   Family History: Family History  Problem Relation Age of Onset  . Hypertension Mother   . Diabetes Mother   . Cirrhosis Mother        ? medications  . Lung cancer Father   . Stroke Father   . Diabetes Sister   . Hypertension Sister   . Colon cancer Neg Hx   . Esophageal cancer Neg Hx   . Rectal cancer Neg Hx     Allergies and Medications: Allergies  Allergen Reactions  . Aspirin Nausea Only and Other (See Comments)    Upsets the stomach   No current facility-administered medications  on file prior to encounter.   Current Outpatient Medications on File Prior to Encounter  Medication Sig Dispense Refill  . Acetaminophen (APAP) 325 MG tablet Take 2 tablets (650 mg total) by mouth every 6 (six) hours as needed. (Patient taking differently: Take 650 mg by mouth every 6 (six) hours as needed for pain (or headaches). ) 30 tablet   . albuterol (PROAIR HFA) 108 (90 Base) MCG/ACT inhaler INHALE 2 PUFFS INTO THE LUNGS EVERY 6 HOURS AS NEEDED FOR SHORTNESS OF BREATH (Patient taking differently: Inhale 2 puffs into the lungs every 4 (four) hours as needed for wheezing or shortness of breath. )  8.5 g 5  . albuterol (PROVENTIL) (2.5 MG/3ML) 0.083% nebulizer solution Take 3 mLs (2.5 mg total) by nebulization every 6 (six) hours as needed for wheezing or shortness of breath. 75 mL 2  . AMBULATORY NON FORMULARY MEDICATION Nitroglycerin oint. 0.125% Apply a small pea size amount BID for 6-8 weeks (Patient taking differently: Place 1 application rectally See admin instructions. Nitroglycerin oint. 0.125%: Apply a small pea-sized amount rectally as directed two times a day for 6-8 weeks) 30 g 1  . benzonatate (TESSALON) 200 MG capsule Take 1 capsule (200 mg total) by mouth 3 (three) times daily as needed for cough. 30 capsule 1  . budesonide-formoterol (SYMBICORT) 80-4.5 MCG/ACT inhaler Inhale 2 puffs into the lungs 2 (two) times daily for 1 day. 1 Inhaler 0  . cetirizine (ZYRTEC) 10 MG tablet Take 1 tablet (10 mg total) by mouth daily. (Patient taking differently: Take 10 mg by mouth at bedtime. ) 30 tablet 11  . dextromethorphan-guaiFENesin (MUCINEX DM) 30-600 MG 12hr tablet Take 1-2 tablets by mouth at bedtime as needed for cough.    . diclofenac Sodium (VOLTAREN) 1 % GEL Apply 4 g topically 4 (four) times daily as needed (pain). 50 g 0  . fluticasone (FLONASE) 50 MCG/ACT nasal spray Place 2 sprays into both nostrils in the morning and at bedtime.     Marland Kitchen levothyroxine (SYNTHROID) 125 MCG tablet Take  1 tablet (125 mcg total) by mouth 2 (two) times daily. (Patient taking differently: Take 250 mcg by mouth daily before breakfast. ) 180 tablet 3  . metFORMIN (GLUCOPHAGE) 500 MG tablet Take 1 tablet (500 mg total) by mouth 2 (two) times daily with a meal. 180 tablet 2  . OXYGEN Inhale 4 L/min into the lungs continuous.     . pantoprazole (PROTONIX) 40 MG tablet Take 1 tablet (40 mg total) by mouth 2 (two) times daily before a meal. 60 tablet 3  . predniSONE (DELTASONE) 10 MG tablet Take 4 tabs po daily x 3 days; then 3 tabs daily x3 days; then 2 tabs daily x3 days; then 1 tab daily x 3 days; then stop (Patient taking differently: Take 10-40 mg by mouth See admin instructions. Take 40 mg by mouth once daily for 3 days, 30 mg once daily for 3 days, 20 mg once daily for 3 days, and 10 mg once daily for 3 days- then, stop) 30 tablet 0  . SPIRIVA HANDIHALER 18 MCG inhalation capsule INHALE 1 CAPSULE VIA HANDIHALER ONCE DAILY AT THE SAME TIME EVERY DAY (Patient taking differently: Place 18 mcg into inhaler and inhale daily. ) 30 capsule 0  . ursodiol (ACTIGALL) 300 MG capsule Take 300 mg by mouth in the morning, at noon, and at bedtime.    . Accu-Chek Softclix Lancets lancets Use with three times daily with meals and at night. E11.9 100 each 3  . blood glucose meter kit and supplies Dispense based on patient and insurance preference. Use up to four times daily as directed. (FOR ICD-10 E10.9, E11.9). 1 each 0  . Blood Glucose Monitoring Suppl (ACCU-CHEK GUIDE) w/Device KIT 1 Device by Does not apply route as directed. Use with three times daily with meals and at night. E11.9 1 kit 0  . glucose blood (ACCU-CHEK GUIDE) test strip Use with three times daily with meals and at night. E11.9 100 each 12  . Lancets Misc. (ACCU-CHEK SOFTCLIX LANCET DEV) KIT Use with three times daily with meals and at night. E11.9 1 kit 0  .  Pirfenidone (ESBRIET) 267 MG TABS Take 1 tablet (267 mg total) by mouth 3 (three) times daily  with meals for 7 days, THEN 2 tablets (534 mg total) 3 (three) times daily with meals for 23 days. 159 tablet 0    Objective: BP 114/68   Pulse 97   Temp 98 F (36.7 C) (Axillary)   Resp (!) 26   Ht _0  (1.676 m)   Wt 111.1 kg   LMP  (LMP Unknown)   SpO2 91%   BMI 39.54 kg/m   Exam: General: chronically ill appearing female, frequently coughing during exam, tired appearing Eyes: No scleral icterus or conjunctival injection, extraocular muscles intact bilaterally ENTM: No oropharyngeal erythema. Shallow, nonpainful ulcerations on sides of tongue.  Cardiovascular: Hyperdynamic S1, faint left sided systolic murmur , bilateral radial pulses palpable Respiratory: on 4 liters via [place], occasional expiratory wheezing, bilateral diffuse crackles. Significant coughing throughout exam w/ intermittent dyspnea.  Gastrointestinal: obese pannus. Nontender, bowel sounds present throughout, soft MSK: Bilateral lower extremities with 1+ pitting edema, nontender, no erythema or tenderness to palpation Derm: No lesions or ulcerations appreciated Neuro: Alert and oriented x4  Labs and Imaging: CBC BMET  Recent Labs  Lab 09/05/2019 2130  WBC 1.7*  HGB 7.7*  HCT 26.3*  PLT 60*   Recent Labs  Lab 09/07/2019 2130  NA 133*  K 3.9  CL 106  CO2 20*  BUN 11  CREATININE 0.64  GLUCOSE 262*  CALCIUM 8.1*     EKG: No EKG completed  DG Chest 2 View  Result Date: 08/13/2019 CLINICAL DATA:  Shortness of breath with exertion for 1 week, central chest pain EXAM: CHEST - 2 VIEW COMPARISON:  CT 07/14/2019, radiograph 07/13/2019 FINDINGS: Lung volumes are diminished. There are diffuse mixed hazy interstitial and Mueller patchy consolidative opacity throughout both lungs. No pneumothorax or visible effusion. Cardiomediastinal contours are similar to prior with borderline cardiomegaly. No acute osseous or soft tissue abnormality. Degenerative changes are present in the imaged spine and shoulders. Telemetry leads  overlie the chest. IMPRESSION: Bilateral mixed interstitial and multifocal patchy consolidative opacity throughout both lungs. Findings may reflect multifocal pneumonia versus mixed interstitial and alveolar edema. Electronically Signed   By: Lovena Le M.D.   On: 09/04/2019 21:49    Stark Klein, MD 09/01/2019, 2:59 AM PGY-1, [place] Intern pager: [phone], text pages welcome  Resident Addendum I have separately seen and examined the patient.  I have discussed the findings and exam with the resident and agree with the above note.  I helped develop the management plan that is described in the resident's note and I agree with the content.  Changes have been made in BLUE.    Addison Naegeli, MD PGY-2 [facility] residency program

## 2019-09-01 ENCOUNTER — Encounter (HOSPITAL_COMMUNITY): Payer: Self-pay | Admitting: Family Medicine

## 2019-09-01 ENCOUNTER — Ambulatory Visit: Payer: Medicaid Other | Admitting: Primary Care

## 2019-09-01 DIAGNOSIS — R609 Edema, unspecified: Secondary | ICD-10-CM | POA: Diagnosis not present

## 2019-09-01 DIAGNOSIS — I5033 Acute on chronic diastolic (congestive) heart failure: Secondary | ICD-10-CM | POA: Diagnosis present

## 2019-09-01 DIAGNOSIS — Z66 Do not resuscitate: Secondary | ICD-10-CM | POA: Diagnosis present

## 2019-09-01 DIAGNOSIS — J84112 Idiopathic pulmonary fibrosis: Secondary | ICD-10-CM | POA: Diagnosis present

## 2019-09-01 DIAGNOSIS — F419 Anxiety disorder, unspecified: Secondary | ICD-10-CM | POA: Diagnosis present

## 2019-09-01 DIAGNOSIS — D72819 Decreased white blood cell count, unspecified: Secondary | ICD-10-CM

## 2019-09-01 DIAGNOSIS — J441 Chronic obstructive pulmonary disease with (acute) exacerbation: Secondary | ICD-10-CM

## 2019-09-01 DIAGNOSIS — Z20822 Contact with and (suspected) exposure to covid-19: Secondary | ICD-10-CM | POA: Diagnosis present

## 2019-09-01 DIAGNOSIS — J9621 Acute and chronic respiratory failure with hypoxia: Secondary | ICD-10-CM | POA: Diagnosis present

## 2019-09-01 DIAGNOSIS — J849 Interstitial pulmonary disease, unspecified: Secondary | ICD-10-CM | POA: Diagnosis not present

## 2019-09-01 DIAGNOSIS — J189 Pneumonia, unspecified organism: Secondary | ICD-10-CM

## 2019-09-01 DIAGNOSIS — I82452 Acute embolism and thrombosis of left peroneal vein: Secondary | ICD-10-CM | POA: Diagnosis present

## 2019-09-01 DIAGNOSIS — K743 Primary biliary cirrhosis: Secondary | ICD-10-CM | POA: Diagnosis present

## 2019-09-01 DIAGNOSIS — J440 Chronic obstructive pulmonary disease with acute lower respiratory infection: Secondary | ICD-10-CM | POA: Diagnosis present

## 2019-09-01 DIAGNOSIS — E871 Hypo-osmolality and hyponatremia: Secondary | ICD-10-CM | POA: Diagnosis present

## 2019-09-01 DIAGNOSIS — I471 Supraventricular tachycardia: Secondary | ICD-10-CM | POA: Diagnosis present

## 2019-09-01 DIAGNOSIS — G40909 Epilepsy, unspecified, not intractable, without status epilepticus: Secondary | ICD-10-CM | POA: Diagnosis present

## 2019-09-01 DIAGNOSIS — M1990 Unspecified osteoarthritis, unspecified site: Secondary | ICD-10-CM | POA: Diagnosis present

## 2019-09-01 DIAGNOSIS — E039 Hypothyroidism, unspecified: Secondary | ICD-10-CM | POA: Diagnosis present

## 2019-09-01 DIAGNOSIS — Z90710 Acquired absence of both cervix and uterus: Secondary | ICD-10-CM | POA: Diagnosis not present

## 2019-09-01 DIAGNOSIS — R0603 Acute respiratory distress: Secondary | ICD-10-CM | POA: Diagnosis not present

## 2019-09-01 DIAGNOSIS — E1122 Type 2 diabetes mellitus with diabetic chronic kidney disease: Secondary | ICD-10-CM | POA: Diagnosis present

## 2019-09-01 DIAGNOSIS — K7469 Other cirrhosis of liver: Secondary | ICD-10-CM | POA: Diagnosis not present

## 2019-09-01 DIAGNOSIS — E1165 Type 2 diabetes mellitus with hyperglycemia: Secondary | ICD-10-CM | POA: Diagnosis present

## 2019-09-01 DIAGNOSIS — K8309 Other cholangitis: Secondary | ICD-10-CM | POA: Diagnosis present

## 2019-09-01 DIAGNOSIS — I350 Nonrheumatic aortic (valve) stenosis: Secondary | ICD-10-CM | POA: Diagnosis not present

## 2019-09-01 DIAGNOSIS — K219 Gastro-esophageal reflux disease without esophagitis: Secondary | ICD-10-CM | POA: Diagnosis present

## 2019-09-01 DIAGNOSIS — D61818 Other pancytopenia: Secondary | ICD-10-CM | POA: Diagnosis present

## 2019-09-01 DIAGNOSIS — N189 Chronic kidney disease, unspecified: Secondary | ICD-10-CM | POA: Diagnosis present

## 2019-09-01 DIAGNOSIS — Z515 Encounter for palliative care: Secondary | ICD-10-CM | POA: Diagnosis present

## 2019-09-01 LAB — RESPIRATORY PANEL BY RT PCR (FLU A&B, COVID)
Influenza A by PCR: NEGATIVE
Influenza B by PCR: NEGATIVE
SARS Coronavirus 2 by RT PCR: NEGATIVE

## 2019-09-01 LAB — POCT I-STAT 7, (LYTES, BLD GAS, ICA,H+H)
Acid-base deficit: 3 mmol/L — ABNORMAL HIGH (ref 0.0–2.0)
Bicarbonate: 20.6 mmol/L (ref 20.0–28.0)
Calcium, Ion: 1.25 mmol/L (ref 1.15–1.40)
HCT: 24 % — ABNORMAL LOW (ref 36.0–46.0)
Hemoglobin: 8.2 g/dL — ABNORMAL LOW (ref 12.0–15.0)
O2 Saturation: 93 %
Potassium: 4.6 mmol/L (ref 3.5–5.1)
Sodium: 138 mmol/L (ref 135–145)
TCO2: 22 mmol/L (ref 22–32)
pCO2 arterial: 31.7 mmHg — ABNORMAL LOW (ref 32.0–48.0)
pH, Arterial: 7.421 (ref 7.350–7.450)
pO2, Arterial: 64 mmHg — ABNORMAL LOW (ref 83.0–108.0)

## 2019-09-01 LAB — CBC WITH DIFFERENTIAL/PLATELET
Abs Immature Granulocytes: 0.02 10*3/uL (ref 0.00–0.07)
Basophils Absolute: 0 10*3/uL (ref 0.0–0.1)
Basophils Relative: 1 %
Eosinophils Absolute: 0 10*3/uL (ref 0.0–0.5)
Eosinophils Relative: 0 %
HCT: 26 % — ABNORMAL LOW (ref 36.0–46.0)
Hemoglobin: 7.7 g/dL — ABNORMAL LOW (ref 12.0–15.0)
Immature Granulocytes: 2 %
Lymphocytes Relative: 12 %
Lymphs Abs: 0.2 10*3/uL — ABNORMAL LOW (ref 0.7–4.0)
MCH: 25.1 pg — ABNORMAL LOW (ref 26.0–34.0)
MCHC: 29.6 g/dL — ABNORMAL LOW (ref 30.0–36.0)
MCV: 84.7 fL (ref 80.0–100.0)
Monocytes Absolute: 0.1 10*3/uL (ref 0.1–1.0)
Monocytes Relative: 4 %
Neutro Abs: 1.1 10*3/uL — ABNORMAL LOW (ref 1.7–7.7)
Neutrophils Relative %: 81 %
Platelets: 54 10*3/uL — ABNORMAL LOW (ref 150–400)
RBC: 3.07 MIL/uL — ABNORMAL LOW (ref 3.87–5.11)
RDW: 16.1 % — ABNORMAL HIGH (ref 11.5–15.5)
WBC: 1.4 10*3/uL — CL (ref 4.0–10.5)
nRBC: 0 % (ref 0.0–0.2)

## 2019-09-01 LAB — PROCALCITONIN
Procalcitonin: <0.1 ng/mL
Procalcitonin: <0.1 ng/mL

## 2019-09-01 LAB — COMPREHENSIVE METABOLIC PANEL
ALT: 23 U/L (ref 0–44)
AST: 33 U/L (ref 15–41)
Albumin: 2.4 g/dL — ABNORMAL LOW (ref 3.5–5.0)
Alkaline Phosphatase: 122 U/L (ref 38–126)
Anion gap: 10 (ref 5–15)
BUN: 10 mg/dL (ref 6–20)
CO2: 18 mmol/L — ABNORMAL LOW (ref 22–32)
Calcium: 8.1 mg/dL — ABNORMAL LOW (ref 8.9–10.3)
Chloride: 108 mmol/L (ref 98–111)
Creatinine, Ser: 0.75 mg/dL (ref 0.44–1.00)
GFR calc Af Amer: >60 mL/min (ref >60)
GFR calc non Af Amer: >60 mL/min (ref >60)
Glucose, Bld: 372 mg/dL — ABNORMAL HIGH (ref 70–99)
Potassium: 4.2 mmol/L (ref 3.5–5.1)
Sodium: 136 mmol/L (ref 135–145)
Total Bilirubin: 1.1 mg/dL (ref 0.3–1.2)
Total Protein: 6.6 g/dL (ref 6.5–8.1)

## 2019-09-01 LAB — CBG MONITORING, ED
Glucose-Capillary: 272 mg/dL — ABNORMAL HIGH (ref 70–99)
Glucose-Capillary: 311 mg/dL — ABNORMAL HIGH (ref 70–99)
Glucose-Capillary: 315 mg/dL — ABNORMAL HIGH (ref 70–99)

## 2019-09-01 LAB — TROPONIN I (HIGH SENSITIVITY): Troponin I (High Sensitivity): 5 ng/L (ref <18)

## 2019-09-01 LAB — D-DIMER, QUANTITATIVE: D-Dimer, Quant: 0.86 ug/mL-FEU — ABNORMAL HIGH (ref 0.00–0.50)

## 2019-09-01 LAB — GLUCOSE, CAPILLARY
Glucose-Capillary: 296 mg/dL — ABNORMAL HIGH (ref 70–99)
Glucose-Capillary: 309 mg/dL — ABNORMAL HIGH (ref 70–99)

## 2019-09-01 LAB — HEMOGLOBIN A1C
Hgb A1c MFr Bld: 6.4 % — ABNORMAL HIGH (ref 4.8–5.6)
Mean Plasma Glucose: 136.98 mg/dL

## 2019-09-01 LAB — BRAIN NATRIURETIC PEPTIDE: B Natriuretic Peptide: 96.1 pg/mL (ref 0.0–100.0)

## 2019-09-01 MED ORDER — IPRATROPIUM-ALBUTEROL 0.5-2.5 (3) MG/3ML IN SOLN
3.0000 mL | RESPIRATORY_TRACT | Status: DC
  Administered 2019-09-01 – 2019-09-02 (×11): 3 mL via RESPIRATORY_TRACT
  Filled 2019-09-01 (×8): qty 3
  Filled 2019-09-01: qty 6
  Filled 2019-09-01 (×3): qty 3

## 2019-09-01 MED ORDER — DM-GUAIFENESIN ER 30-600 MG PO TB12
1.0000 | ORAL_TABLET | Freq: Every evening | ORAL | Status: DC | PRN
  Administered 2019-09-01 (×2): 2 via ORAL
  Filled 2019-09-01: qty 2
  Filled 2019-09-01: qty 1
  Filled 2019-09-01: qty 2

## 2019-09-01 MED ORDER — ACETAMINOPHEN 325 MG PO TABS
650.0000 mg | ORAL_TABLET | Freq: Four times a day (QID) | ORAL | Status: DC | PRN
  Administered 2019-09-01 – 2019-09-02 (×3): 650 mg via ORAL
  Filled 2019-09-01 (×3): qty 2

## 2019-09-01 MED ORDER — INSULIN ASPART 100 UNIT/ML ~~LOC~~ SOLN
0.0000 [IU] | Freq: Every day | SUBCUTANEOUS | Status: DC
  Administered 2019-09-01 – 2019-09-02 (×3): 3 [IU] via SUBCUTANEOUS
  Administered 2019-09-03: 2 [IU] via SUBCUTANEOUS

## 2019-09-01 MED ORDER — ACETAMINOPHEN 650 MG RE SUPP: 650.0000 mg | Freq: Four times a day (QID) | RECTAL | Status: DC | PRN

## 2019-09-01 MED ORDER — SODIUM CHLORIDE 0.9 % IV SOLN
1.0000 g | Freq: Once | INTRAVENOUS | Status: AC
  Administered 2019-09-01: 21:00:00 1 g via INTRAVENOUS
  Filled 2019-09-01: qty 1

## 2019-09-01 MED ORDER — PANTOPRAZOLE SODIUM 40 MG PO TBEC
40.0000 mg | DELAYED_RELEASE_TABLET | Freq: Two times a day (BID) | ORAL | Status: DC
  Administered 2019-09-01 – 2019-09-03 (×6): 40 mg via ORAL
  Filled 2019-09-01 (×6): qty 1

## 2019-09-01 MED ORDER — LEVOTHYROXINE SODIUM 50 MCG PO TABS
250.0000 ug | ORAL_TABLET | Freq: Every day | ORAL | Status: DC
  Administered 2019-09-01 – 2019-09-03 (×3): 250 ug via ORAL
  Filled 2019-09-01: qty 1
  Filled 2019-09-01 (×2): qty 2

## 2019-09-01 MED ORDER — FLUTICASONE PROPIONATE 50 MCG/ACT NA SUSP
2.0000 | Freq: Every day | NASAL | Status: DC
  Administered 2019-09-01 – 2019-09-04 (×3): 2 via NASAL
  Filled 2019-09-01: qty 16

## 2019-09-01 MED ORDER — METHYLPREDNISOLONE SODIUM SUCC 125 MG IJ SOLR
60.0000 mg | Freq: Every day | INTRAMUSCULAR | Status: DC
  Administered 2019-09-01: 60 mg via INTRAVENOUS
  Filled 2019-09-01: qty 2

## 2019-09-01 MED ORDER — LORATADINE 10 MG PO TABS
10.0000 mg | ORAL_TABLET | Freq: Every day | ORAL | Status: DC
  Administered 2019-09-01 – 2019-09-03 (×3): 10 mg via ORAL
  Filled 2019-09-01 (×3): qty 1

## 2019-09-01 MED ORDER — ALBUTEROL SULFATE (2.5 MG/3ML) 0.083% IN NEBU
2.5000 mg | INHALATION_SOLUTION | RESPIRATORY_TRACT | Status: DC | PRN
  Administered 2019-09-02: 2.5 mg via RESPIRATORY_TRACT
  Filled 2019-09-01 (×2): qty 3

## 2019-09-01 MED ORDER — FLUTICASONE FUROATE-VILANTEROL 100-25 MCG/INH IN AEPB
1.0000 | INHALATION_SPRAY | Freq: Every day | RESPIRATORY_TRACT | Status: DC
  Filled 2019-09-01: qty 28

## 2019-09-01 MED ORDER — FUROSEMIDE 10 MG/ML IJ SOLN
40.0000 mg | Freq: Once | INTRAMUSCULAR | Status: AC
  Administered 2019-09-01: 40 mg via INTRAVENOUS
  Filled 2019-09-01: qty 4

## 2019-09-01 MED ORDER — ENOXAPARIN SODIUM 40 MG/0.4ML ~~LOC~~ SOLN
40.0000 mg | Freq: Every day | SUBCUTANEOUS | Status: DC
  Administered 2019-09-01 – 2019-09-02 (×2): 40 mg via SUBCUTANEOUS
  Filled 2019-09-01 (×2): qty 0.4

## 2019-09-01 MED ORDER — POTASSIUM CHLORIDE CRYS ER 20 MEQ PO TBCR
40.0000 meq | EXTENDED_RELEASE_TABLET | Freq: Once | ORAL | Status: AC
  Administered 2019-09-01: 13:00:00 40 meq via ORAL
  Filled 2019-09-01: qty 2

## 2019-09-01 MED ORDER — ALBUTEROL SULFATE (2.5 MG/3ML) 0.083% IN NEBU: 2.5000 mg | INHALATION_SOLUTION | RESPIRATORY_TRACT | Status: DC | PRN

## 2019-09-01 MED ORDER — URSODIOL 300 MG PO CAPS
300.0000 mg | ORAL_CAPSULE | Freq: Every day | ORAL | Status: DC
  Administered 2019-09-01: 300 mg via ORAL
  Filled 2019-09-01: qty 1

## 2019-09-01 MED ORDER — INSULIN ASPART 100 UNIT/ML ~~LOC~~ SOLN
0.0000 [IU] | Freq: Three times a day (TID) | SUBCUTANEOUS | Status: DC
  Administered 2019-09-01: 11 [IU] via SUBCUTANEOUS
  Administered 2019-09-03 (×3): 5 [IU] via SUBCUTANEOUS

## 2019-09-01 MED ORDER — URSODIOL 300 MG PO CAPS
300.0000 mg | ORAL_CAPSULE | Freq: Three times a day (TID) | ORAL | Status: DC
  Administered 2019-09-01 – 2019-09-03 (×6): 300 mg via ORAL
  Filled 2019-09-01 (×14): qty 1

## 2019-09-01 MED ORDER — UMECLIDINIUM BROMIDE 62.5 MCG/INH IN AEPB
1.0000 | INHALATION_SPRAY | Freq: Every day | RESPIRATORY_TRACT | Status: DC
  Filled 2019-09-01: qty 7

## 2019-09-01 MED ORDER — DEXTROSE 5 % IV SOLN
250.0000 mg | INTRAVENOUS | Status: DC
  Administered 2019-09-01: 250 mg via INTRAVENOUS
  Filled 2019-09-01: qty 250

## 2019-09-01 MED ORDER — INSULIN ASPART 100 UNIT/ML ~~LOC~~ SOLN
0.0000 [IU] | Freq: Three times a day (TID) | SUBCUTANEOUS | Status: DC
  Administered 2019-09-01: 7 [IU] via SUBCUTANEOUS

## 2019-09-01 MED ORDER — PREDNISONE 20 MG PO TABS
40.0000 mg | ORAL_TABLET | Freq: Every day | ORAL | Status: DC
  Administered 2019-09-01: 40 mg via ORAL
  Filled 2019-09-01: qty 2

## 2019-09-01 NOTE — Progress Notes (Signed)
1814Higinio Plan, MD made aware of patient desat to 82% on 4L of O2. MD stated give patient some time to situated and her stats will increase.  1824: Patient currently on 92% on 4L with increased work of breathing. Will continue to monitor this patient.

## 2019-09-01 NOTE — Progress Notes (Signed)
Inpatient Diabetes Program Recommendations  AACE/ADA: New Consensus Statement on Inpatient Glycemic Control   Target Ranges:  Prepandial:   less than 140 mg/dL      Peak postprandial:   less than 180 mg/dL (1-2 hours)      Critically ill patients:  140 - 180 mg/dL  Results for [name], [name] (MRN [id number]) as of 09/01/2019 12:21  Ref. Range 09/01/2019 01:19 09/01/2019 08:00 09/01/2019 11:46  Glucose-Capillary Latest Ref Range: 70 - 99 mg/dL 272 (H) 311 (H) 315 (H)  Results for [name], [name] (MRN [id number]) as of 09/01/2019 12:21  Ref. Range 05/23/2019 04:37 09/01/2019 00:32  Hemoglobin A1C Latest Ref Range: 4.8 - 5.6 % 5.5 6.4 (H)    Review of Glycemic Control  Diabetes history: DM2 Outpatient Diabetes medications: Metformin 500 mg BID Current orders for Inpatient glycemic control: Novolog 0-15 units TID with meals, Novolog 0-5 units QHS; Solumedrol 60 mg daily  Inpatient Diabetes Program Recommendations:   Insulin - Basal: If steroids are continued, please consider ordering Levemir 10 units Q24H.  Thanks, Barnie Alderman, RN, MSN, CDE Diabetes Coordinator Inpatient Diabetes Program [phone] (Team Pager from 8am to 5pm)

## 2019-09-01 NOTE — ED Notes (Addendum)
Admitting Paged by Starleen Blue

## 2019-09-01 NOTE — ED Notes (Addendum)
Spoke with RN on 6N. Attempted to give report. Per RN on floor, pt may need progressive care.  Admitting provider and pulmonology paged to discussed by this RN.

## 2019-09-01 NOTE — ED Notes (Signed)
Assisted patient after voiding; clean linen placed; patient able to turn side to side; noted patient became short of breath on exertion; Sp02 decreased and HR and RR increased; encouraged patient to take slow deep breathes; supplemental 02 delivery changed to NRB at 10 L

## 2019-09-01 NOTE — Progress Notes (Addendum)
Family Medicine Teaching Service Daily Progress Note Intern Pager: [phone]  Patient name: Holly Mueller Medical record number: [id number] Date of birth: 1966/03/06 Age: 54 y.o. Gender: female  Primary Care Provider: Cleophas Dunker, DO Consultants: N/A Code Status: FULL  Pt Overview and Major Events to Date:  4/22 Admitted  Assessment and Plan: Holly Mueller is a 54 y.o. female presenting with SOB secondary to exacerbation of her chronic lung disease. 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  Acute on chronic hypoxic respiratory failure concerning for CAP vs COPD Exacerbation Endorses continued increase work of breathing. Appears very tired and continues with intermittent dry cough. Patient presents to ED with complaint of 10 days of worsening SOB and DOE. Pt tachypneic and satting low 90s on 10L. Labs/imaging significant for chronic pancytopenia, BNP 88.7, trops 4,5, CXR showing b/l multifocal opacities reflecting PNA vs mixed interstital/alveolar edema. Patient is afebrile and CXR similar to previous CXRs. Procalcitonin <0.10 -Consult to PCCM appreciate recommendation -f/u ABG -Solu-Medrol x1  -Continue abx: CTX (4/22- ) Azithro (4/22- ) -DuoNeb every 4 hours -Continue albuterol every 2 hours as needed -Albuterol 4 puffs daily -Mucinex DM 1-2 tablets at bedtime as needed if -Continue Flonase -Continue home Incruse ellipta -Breo Ellipta/substitute for Symbicort  Diarrhea, acute  1 BM since admission and was formed. - consider GI PCR panel if >6 bowel movements/day  - encourage oral fluid intake   Hyponatremia, Resolved.  Na 136  -monitor with CMP   DM CBG 372.   -We will hold Metformin while hospitalized -Sensitive sliding scale insulin -CBG monitoring with meals and at bedtime  Hepatic cirrhosis  secondary to primary biliary cholangitis This is chronic and stable condition for the patient.  Patient on home ursodiol. No RUQ pain on admission.  -We will monitor hepatic enzymes with CMP -Continue home Actigall 300 mg 3 times daily  Hypothyroidism Patient on Synthroid 250 mcg as outpatient. -Continue home Synthroid  Chronic pancytopenia Follows with heme/onc as outpatient for this chronic problem.  White blood cell count 1.4, Hgb 7.7, plt 54k, consistent with previous values. . -monitor with CBC and differential  -monitor for signs of infection, fever curve and worsening cough/respiratory status   GERD Continue home Protonix 70m  FEN/GI: Carbohydrate modified diet  Prophylaxis: Lovenox   Disposition: Home pending medically stable for discharge  Subjective:  Endorses increasing work of breathing. Sleepy but has not slept because she cannot lie down.  Objective: Temp:  [98 F (36.7 C)] 98 F (36.7 C) (04/22 2105) Pulse Rate:  [91-104] 99 (04/23 0700) Resp:  [19-31] 22 (04/23 0700) BP: (113-163)/(59-102) 125/72 (04/23 0700) SpO2:  [90 %-100 %] 92 % (04/23 0700) Weight:  [111.1 kg] 111.1 kg (04/22 2106) Physical Exam: General: Appears unwell, moderate acute distress. Age appropriate. Cardiac: tachycardia, regular rhythm, normal heart sounds, no murmurs Respiratory: Diffuse velcro crackles, markedly increased effort Extremities: Mild LE edema. WWP Neuro: alert and oriented X4  Laboratory: Recent Labs  Lab 08/26/2019 2130 09/01/19 0338  WBC 1.7* 1.4*  HGB 7.7* 7.7*  HCT 26.3* 26.0*  PLT 60* 54*   Recent Labs  Lab 09/03/2019 2130 09/01/19 0338  NA 133* 136  K 3.9 4.2  CL 106 108  CO2 20* 18*  BUN 11 10  CREATININE 0.64 0.75  CALCIUM 8.1* 8.1*  PROT  --  6.6  BILITOT  --  1.1  ALKPHOS  --  122  ALT  --  23  AST  --  33  GLUCOSE 262* 372*    Imaging/Diagnostic Tests:  CHEST - 2 VIEW COMPARISON:  CT 07/14/2019, radiograph  07/13/2019 IMPRESSION: Bilateral mixed interstitial and multifocal patchy consolidative opacity throughout both lungs. Findings may reflect multifocal pneumonia versus mixed interstitial and alveolar edema.  Gerlene Fee, DO 09/01/2019, 7:32 AM PGY-1, [place] Intern pager: [phone], text pages welcome

## 2019-09-01 NOTE — Progress Notes (Addendum)
Post neb tx, pt c/o pain and tightness in chest. Pt with clear/diminished bbs pre treatment, and exp wheeze with crackles post tx. Pt requesting PRB and wants it on 8L. Pt placed on mask, requested to continue wearing for a few minutes. Pt then placed on 6L [place] for comfort. Additional flow meter placed in room to keep mask plugged in for when pt decompensates. Pt states anytime she is moved or rolled over, she has to put on mask. RT will continue to monitor.

## 2019-09-01 NOTE — ED Notes (Signed)
Critical WBC count called from lab. MD notified.

## 2019-09-01 NOTE — ED Notes (Signed)
Spoke with Dr. Rosana Hoes and pulmonology regarding progressive care. Per admitting, pt is able to go to 6N. 6N aware. Report given to floor RN Percell Locus.

## 2019-09-01 NOTE — ED Notes (Signed)
At approx 0050 pt called out to nurse's station for need to use bathroom. Nurse Tech took pt to bathroom via wheelchair, maintaining 4 lpm O2 via [place]. Pt desaturated to 85% and was taken back to room and put on 15 lpm NRB, where O2 sats recovered within five minutes. Pt transferred to bed and returned to 4 lpm [place], maintaining sats in 90's

## 2019-09-01 NOTE — Progress Notes (Signed)
PT Cancellation Note  Patient Details Name: Holly Mueller MRN: [id number] DOB: 1966-05-04   Cancelled Treatment:    Reason Eval/Treat Not Completed: Medical issues which prohibited therapy Pt becoming increasingly SOB and had to be placed on NRB in ED. Will hold until pt medically appropriate and follow up as schedule allows.   Lou Miner, DPT  Acute Rehabilitation Services  Pager: [phone] Office: [phone]   Rudean Hitt 09/01/2019, 11:56 AM

## 2019-09-01 NOTE — Consult Note (Addendum)
NAME:  Holly Mueller, MRN:  [id number], DOB:  Oct 23, 1965, LOS: 0 ADMISSION DATE:  09/03/2019, CONSULTATION DATE:  4/3 REFERRING MD:  Dr. Nori Riis / FPTS, CHIEF COMPLAINT:  Dyspnea    Brief History   54 y/o F with IPAF / ILD with UIP pattern admitted on 4/22 with reports of worsening shortness of breath.    History of present illness   54 y/o F who presented to [facility] on 4/22 with worsening shortness of breath.    She is followed by Dr. Chase Caller for IPAF / ILD, asthma, restrictive lung disease and chronic respiratory failure.  She has known chronic cough, GERD, biliary cholangitis, hepatic cirrhosis, pancytopenia.  Her radiographic images support are consistent with UIP.  She is on 3-4L O2 at baseline.    The patient was treated for bacterial sinusitis on 4/8 with Augmentin which she completed.  She reports she has had progressive shortness of breath over the past two weeks.  She saw Derl Barrow, NP on 4/19 for an acute visit for worsening shortness of breath and low O2 levels with exertion.  O2 was increased to 3-4L at that time and she was given a prednisone taper.  She notes she has had intermittent diarrhea in the last few days and lower extremity swelling.  The evening prior to presentation, she ambulated to the bathroom and became so short of breath she was unable to recover and called 911. She was recently prescribed Esbriet at reduced dose due to her underlying liver disease but has not started it at this time.    In the ER, the patient reported shortness of breath.  She was treated with IV steroids, magnesium, albuterol and nebulized bronchodilators. She required NRB during episodes of exertion in the ER.  However, at rest she was on 4-5L O2. CXR showed mixed bilateral interstitial and mutli-focal patchy consolidative opacities. She reports she has had elevated glucose due to prednisone use.   PCCM consulted for evaluation.   Past Medical History  IPAF / ILD -baseline 3-4L, positive ANA,  SSA.  Primary Biliary Cholangitis  Hepatic Cirrhosis secondary to Primary Bilary Cholangitis  Gastric Varices  GERD DM  CKD  Epilepsy  Pancytopenia   Significant Hospital Events   4/22 Admit  4/23 PCCM consulted   Consults:  PCCM   Procedures:    Significant Diagnostic Tests:   CXR 4/23 >> bilateral mixed interstitial and alveolar infiltrates   Micro Data:  COVID 4/22 >> negative  Influenza A/B 4/22 >> negative   Antimicrobials:  Rocephin 4/22 >> Azithro 4/22 >>   Interim history/subjective:  Pt reports she knows "it will take a while to feel better"  RN reports pt desaturated to mid 70's with exertion / turning in the bed   Objective   Blood pressure 126/64, pulse 86, temperature 97.8 F (36.6 C), temperature source Oral, resp. rate (!) 24, height _0  (1.676 m), weight 111.1 kg, SpO2 98 %.       No intake or output data in the 24 hours ending 09/01/19 1139 Filed Weights   08/11/2019 2106  Weight: 111.1 kg    Examination: General: chronically ill appearing adult female lying in bed in NAD HEENT: MM pink/moist, edentulous, anicteric, pupils 81m / reactive  Neuro: AAOx4, speech clear, MAE CV: s1s2 RRR, no m/r/g PULM: tachypnea but non-labored, lungs bilaterally with bibasilar posterior velcro crackles 1/2 the way up GI: soft, bsx4 active  Extremities: warm/dry, 1-2+ BLE pitting edema  Skin: no rashes or lesions  Resolved Hospital Problem list      Assessment & Plan:   Idiopathic Pulmonary Autoimmune Fibrosis / ILD / UIP Acute on Chronic Hypoxic Respiratory Failure Restrictive Lung Disease  UIP on radiographic appearance, ANA, SSA positive.  Baseline 3-4L O2.  Negative COVID, influenza.  Current CXR with both interstitial and alveolar infiltrates, LE pitting edema raises the question of possible [place].  She also has had several bursts of prednisone which raises the question of opportunistic infection (note negative procalcitonin) such as a  fungal.  Viral studies negative. She has not had fever or sputum production.       -continue empiric CAP coverage, thought doubt acute bacterial process. -lasix 40 mg IV x1 with KCL -continue empiric steroids  -will defer consideration for FOB to MD -defer timing of Esbriet start to MD -consider Cardiology evaluation for right heart cath as she may be a candidate for Tyvaso  -wean O2 for sats >85%, accept periods of hypoxemia with slow recovery  -follow I/O's -continue Breo, Incruse,  flonase  -PPI BID  -no role for repeat autoimmune testing at this time -would recommend Palliative Care consultation given baseline fibrosis   Attending MD to follow.    Best practice:  Diet: As tolerated  Pain/Anxiety/Delirium protocol (if indicated): n/a VAP protocol (if indicated): n/a DVT prophylaxis: per primary  GI prophylaxis: per primary  Glucose control: per primary  Mobility: As tolerated  Code Status: Full Code.  Discussed concept of mechanical ventilation, life support with patient and she became very tearful.  She states that she knows she will die from her diease process.  She has two children who are 20 and early 20's that she feels will not be able to emotionally handle any decision making for her.  She would like for her brothers/sisters to make the decision for her if she were to require life support and not come off. She would like to have all measures to sustain her life if necessary.   Family Communication: Patient updated on plan of care Disposition: Per [facility]  Labs   CBC: Recent Labs  Lab 08/21/2019 2130 09/01/19 0338  WBC 1.7* 1.4*  NEUTROABS 1.2* 1.1*  HGB 7.7* 7.7*  HCT 26.3* 26.0*  MCV 84.0 84.7  PLT 60* 54*    Basic Metabolic Panel: Recent Labs  Lab 08/16/2019 2130 09/01/19 0338  NA 133* 136  K 3.9 4.2  CL 106 108  CO2 20* 18*  GLUCOSE 262* 372*  BUN 11 10  CREATININE 0.64 0.75  CALCIUM 8.1* 8.1*   GFR: Estimated Creatinine Clearance: 101.5 mL/min (by C-G  formula based on SCr of 0.75 mg/dL). Recent Labs  Lab 08/22/2019 2130 09/01/19 0022 09/01/19 0338  PROCALCITON  --  <0.10 <0.10  WBC 1.7*  --  1.4*    Liver Function Tests: Recent Labs  Lab 09/01/19 0338  AST 33  ALT 23  ALKPHOS 122  BILITOT 1.1  PROT 6.6  ALBUMIN 2.4*   No results for input(s): LIPASE, AMYLASE in the last 168 hours. No results for input(s): AMMONIA in the last 168 hours.  ABG    Component Value Date/Time   PHART 7.401 06/26/2019 1739   PCO2ART 31.5 (L) 06/26/2019 1739   PO2ART 95.0 06/26/2019 1739   HCO3 19.6 (L) 06/26/2019 1739   TCO2 21 (L) 06/26/2019 1739   ACIDBASEDEF 4.0 (H) 06/26/2019 1739   O2SAT 98.0 06/26/2019 1739     Coagulation Profile: No results for input(s): INR, PROTIME in the last 168  hours.  Cardiac Enzymes: No results for input(s): CKTOTAL, CKMB, CKMBINDEX, TROPONINI in the last 168 hours.  HbA1C: HbA1c, POC (controlled diabetic range)  Date/Time Value Ref Range Status  07/26/2018 01:59 PM 6.2 0.0 - 7.0 % Final  03/03/2018 11:22 AM 5.4 0.0 - 7.0 % Final   Hgb A1c MFr Bld  Date/Time Value Ref Range Status  09/01/2019 12:32 AM 6.4 (H) 4.8 - 5.6 % Final    Comment:    (NOTE) Pre diabetes:          5.7%-6.4% Diabetes:              >6.4% Glycemic control for   <7.0% adults with diabetes   05/23/2019 04:37 AM 5.5 4.8 - 5.6 % Final    Comment:    (NOTE) Pre diabetes:          5.7%-6.4% Diabetes:              >6.4% Glycemic control for   <7.0% adults with diabetes     CBG: Recent Labs  Lab 09/01/19 0119 09/01/19 0800  GLUCAP 272* 311*    Review of Systems: Positives in [place]   Gen: Denies fever, chills, weight change, fatigue, night sweats HEENT: Denies blurred vision, double vision, hearing loss, tinnitus, sinus congestion, rhinorrhea, sore throat, neck stiffness, dysphagia PULM: Denies shortness of breath, dry cough, sputum production, hemoptysis, wheezing CV: Denies chest pain, edema in last 2-3 days prior to  admit, 3 pillow orthopnea, paroxysmal nocturnal dyspnea, palpitations GI: Denies abdominal pain, nausea, vomiting, diarrhea, hematochezia, melena, constipation, change in bowel habits GU: Denies dysuria, hematuria, polyuria, oliguria, urethral discharge Endocrine: Denies hot or cold intolerance, polyuria, polyphagia or appetite change Derm: Denies rash, dry skin, scaling or peeling skin change Heme: Denies easy bruising, bleeding, bleeding gums Neuro: Denies headache, numbness, weakness, slurred speech, loss of memory or consciousness  Past Medical History  She,  has a past medical history of Acute respiratory failure with hypoxia ([place]) (09/22/2018), Anxiety, Arthritis, Chronic kidney disease, Community acquired pneumonia (11/28/2018), COPD (chronic obstructive pulmonary disease) ([place]), Diabetes mellitus without complication ([place]), Dysrhythmia, Edentulous, Epilepsy ([place]) (in 1972), Gastric varices with bleeding, GERD (gastroesophageal reflux disease) (2008), Head injury, Headache(784.0), Heart murmur, 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 ([place]), History of blood transfusion, History of kidney stones, Hypothyroidism (2008), Interstitial lung disease ([place]), Leukopenia, Lower esophageal ring (08/18/2013), Pneumonia, Primary biliary cholangitis ([place]) (10/24/2018), Seasonal allergies (2003), SEIZURES, HX OF (12/12/2009), Shortness of breath, and Wears glasses.   Surgical History    Past Surgical History:  Procedure Laterality Date   ABDOMINAL HYSTERECTOMY     " partial "   BALLOON DILATION N/A 10/24/2013   Procedure: BALLOON DILATION;  Surgeon: Gatha Mayer, MD;  Location: [facility];  Service: Endoscopy;  Laterality: N/A;   BIOPSY  09/26/2018   Procedure: BIOPSY;  Surgeon: Thornton Park, MD;  Location: [place];  Service: Gastroenterology;;   BIOPSY  01/09/2019   Procedure: BIOPSY;  Surgeon: Irving Copas., MD;  Location: [place];  Service: Gastroenterology;;    CESAREAN SECTION     x2   DENTAL SURGERY     multiple extractions 3'15   DILATION AND CURETTAGE OF UTERUS     ESOPHAGOGASTRODUODENOSCOPY N/A 10/24/2013   Procedure: ESOPHAGOGASTRODUODENOSCOPY (EGD);  Surgeon: Gatha Mayer, MD;  Location: [facility];  Service: Endoscopy;  Laterality: N/A;   ESOPHAGOGASTRODUODENOSCOPY N/A 01/09/2019   Procedure: ESOPHAGOGASTRODUODENOSCOPY (EGD);  Surgeon: Irving Copas., MD;  Location: [place];  Service: Gastroenterology;  Laterality: N/A;   ESOPHAGOGASTRODUODENOSCOPY (EGD) WITH PROPOFOL N/A 09/26/2018   Procedure: ESOPHAGOGASTRODUODENOSCOPY (EGD) WITH PROPOFOL;  Surgeon: Thornton Park, MD;  Location: [place];  Service: Gastroenterology;  Laterality: N/A;   ESOPHAGOGASTRODUODENOSCOPY (EGD) WITH PROPOFOL N/A 04/26/2019   Procedure: ESOPHAGOGASTRODUODENOSCOPY (EGD) WITH PROPOFOL;  Surgeon: Rush Landmark Telford Nab., MD;  Location: [place];  Service: Gastroenterology;  Laterality: N/A;   HEMOSTASIS CLIP PLACEMENT  04/26/2019   Procedure: HEMOSTASIS CLIP PLACEMENT;  Surgeon: Irving Copas., MD;  Location: [place];  Service: Gastroenterology;;   IR PARACENTESIS  09/23/2018   LIVER BIOPSY  06/30/2012   Procedure: LIVER BIOPSY;  Surgeon: Shann Medal, MD;  Location: [facility];  Service: General;;   [place]  04/26/2019   Procedure: Clide Deutscher;  Surgeon: Mansouraty, Telford Nab., MD;  Location: [place];  Service: Gastroenterology;;  cyanoacrylate   SHOULDER ARTHROSCOPY Right 0539   UMBILICAL HERNIA REPAIR N/A 06/30/2012   Procedure: remove umbilicus;  Surgeon: Shann Medal, MD;  [facility];  Service: General;  Laterality: N/A;   UPPER ESOPHAGEAL ENDOSCOPIC ULTRASOUND (EUS) N/A 01/09/2019   Procedure: UPPER ESOPHAGEAL ENDOSCOPIC ULTRASOUND (EUS);  Surgeon: Irving Copas., MD;  Location: [place];  Service: Gastroenterology;  Laterality: N/A;   VENTRAL HERNIA  REPAIR N/A 06/30/2012   Procedure: LAPAROSCOPIC VENTRAL HERNIA;  Surgeon: Shann Medal, MD;  Location[facility];  Service: General;  Laterality: N/A;  With Mesh   VIDEO BRONCHOSCOPY Bilateral 07/20/2018   Procedure: VIDEO BRONCHOSCOPY WITHOUT FLUORO;  Surgeon: Brand Males, MD;  [facility];  Service: Cardiopulmonary;  Laterality: Bilateral;   WISDOM TOOTH EXTRACTION       Social History   reports that she has never smoked. She has never used smokeless tobacco. She reports previous alcohol use. She reports that she does not use drugs.   Family History   Her family history includes Cirrhosis in her mother; Diabetes in her mother and sister; Hypertension in her mother and sister; Lung cancer in her father; Stroke in her father. There is no history of Colon cancer, Esophageal cancer, or Rectal cancer.   Allergies Allergies  Allergen Reactions   Aspirin Nausea Only and Other (See Comments)    Upsets the stomach     Home Medications  Prior to Admission medications   Medication Sig Start Date End Date Taking? Authorizing Provider  Acetaminophen (APAP) 325 MG tablet Take 2 tablets (650 mg total) by mouth every 6 (six) hours as needed. Patient taking differently: Take 650 mg by mouth every 6 (six) hours as needed for pain (or headaches).  05/27/19  Yes Bonnita Hollow, MD  albuterol (PROAIR HFA) 108 (90 Base) MCG/ACT inhaler INHALE 2 PUFFS INTO THE LUNGS EVERY 6 HOURS AS NEEDED FOR SHORTNESS OF BREATH Patient taking differently: Inhale 2 puffs into the lungs every 4 (four) hours as needed for wheezing or shortness of breath.  04/25/18  Yes Rory Percy, DO  albuterol (PROVENTIL) (2.5 MG/3ML) 0.083% nebulizer solution Take 3 mLs (2.5 mg total) by nebulization every 6 (six) hours as needed for wheezing or shortness of breath. 04/10/19  Yes Brand Males, MD  AMBULATORY NON FORMULARY MEDICATION Nitroglycerin oint. 0.125% Apply a small pea size amount BID for 6-8  weeks Patient taking differently: Place 1 application rectally See admin instructions. Nitroglycerin oint. 0.125%: Apply a small pea-sized amount rectally as directed two times a day for 6-8 weeks 07/10/19  Yes Gatha Mayer, MD  benzonatate (TESSALON) 200 MG capsule  Take 1 capsule (200 mg total) by mouth 3 (three) times daily as needed for cough. 08/28/19  Yes Martyn Ehrich, NP  budesonide-formoterol (SYMBICORT) 80-4.5 MCG/ACT inhaler Inhale 2 puffs into the lungs 2 (two) times daily for 1 day. 01/11/19 03/08/20 Yes Lauraine Rinne, NP  cetirizine (ZYRTEC) 10 MG tablet Take 1 tablet (10 mg total) by mouth daily. Patient taking differently: Take 10 mg by mouth at bedtime.  07/19/19  Yes Meccariello, Bernita Raisin, DO  dextromethorphan-guaiFENesin (MUCINEX DM) 30-600 MG 12hr tablet Take 1-2 tablets by mouth at bedtime as needed for cough.   Yes [provider]  diclofenac Sodium (VOLTAREN) 1 % GEL Apply 4 g topically 4 (four) times daily as needed (pain). 05/27/19  Yes Brimage, Vondra, DO  fluticasone (FLONASE) 50 MCG/ACT nasal spray Place 2 sprays into both nostrils in the morning and at bedtime.    Yes [provider]  levothyroxine (SYNTHROID) 125 MCG tablet Take 1 tablet (125 mcg total) by mouth 2 (two) times daily. Patient taking differently: Take 250 mcg by mouth daily before breakfast.  07/19/19  Yes Meccariello, Bernita Raisin, DO  metFORMIN (GLUCOPHAGE) 500 MG tablet Take 1 tablet (500 mg total) by mouth 2 (two) times daily with a meal. 12/06/18  Yes Meccariello, Bernita Raisin, DO  OXYGEN Inhale 4 L/min into the lungs continuous.    Yes [provider]  pantoprazole (PROTONIX) 40 MG tablet Take 1 tablet (40 mg total) by mouth 2 (two) times daily before a meal. 10/17/18  Yes Glenis Smoker, MD  predniSONE (DELTASONE) 10 MG tablet Take 4 tabs po daily x 3 days; then 3 tabs daily x3 days; then 2 tabs daily x3 days; then 1 tab daily x 3 days; then stop Patient taking differently: Take  10-40 mg by mouth See admin instructions. Take 40 mg by mouth once daily for 3 days, 30 mg once daily for 3 days, 20 mg once daily for 3 days, and 10 mg once daily for 3 days- then, stop 08/28/19  Yes Martyn Ehrich, NP  SPIRIVA HANDIHALER 18 MCG inhalation capsule INHALE 1 CAPSULE VIA HANDIHALER ONCE DAILY AT [place] Patient taking differently: Place 18 mcg into inhaler and inhale daily.  01/09/19  Yes Meccariello, Bernita Raisin, DO  ursodiol (ACTIGALL) 300 MG capsule Take 300 mg by mouth in the morning, at noon, and at bedtime.   Yes [provider]  Accu-Chek Softclix Lancets lancets Use with three times daily with meals and at night. E11.9 07/20/19   Meccariello, Bernita Raisin, DO  blood glucose meter kit and supplies Dispense based on patient and insurance preference. Use up to four times daily as directed. (FOR ICD-10 E10.9, E11.9). 09/02/18   Dessa Phi, DO  Blood Glucose Monitoring Suppl (ACCU-CHEK GUIDE) w/Device KIT 1 Device by Does not apply route as directed. Use with three times daily with meals and at night. E11.9 07/20/19   Meccariello, Bernita Raisin, DO  glucose blood (ACCU-CHEK GUIDE) test strip Use with three times daily with meals and at night. E11.9 07/20/19   Meccariello, Bernita Raisin, DO  Lancets Misc. (ACCU-CHEK SOFTCLIX LANCET DEV) KIT Use with three times daily with meals and at night. E11.9 07/20/19   Meccariello, Bernita Raisin, DO  Pirfenidone (ESBRIET) 267 MG TABS Take 1 tablet (267 mg total) by mouth 3 (three) times daily with meals for 7 days, THEN 2 tablets (534 mg total) 3 (three) times daily with meals for 23 days. 08/30/19 09/29/19  Yopp, Marthe Patch, RPH-CPP     Critical care time: n/a     Noe Gens, MSN, NP-C [facility] 09/01/2019, 12:17 PM   Please see [email or web address] for pager details.

## 2019-09-01 NOTE — Hospital Course (Addendum)
Holly Mueller is a 54 y.o. female who presented with SOB. 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  Acute on Chronic Hypoxic Respiratory Failure  Patient presented to ED via EMS with oxygen saturations in high 80s despite being on 4 liters via . On exam, patient is dyspneic and unable to complete full sentences, observed to have frequent coughing fits accompanied by desaturations. Patient was afebrile but did have increased RR into low 30s. Patient with CXR findings concerning for CAP including bilateral mixed interstitial and multifocal patchy consolidative opacity throughout both lungs that may reflect multifocal pneumonia versus mixed interstitial and alveolar edema. Labs notable for leukopenia 1.7, negative COVID and negative respiratory viral panel. BNP was 88.7. Troponin level was 4 and 5. ECHO w/ LVEF 60-65% G1DD, severe left atrial enlargement.  LE ultrasound with LLE DVT; heparin was subsequently started. Patient also received 2grams of magnesium in ED. Was further treated with azithromycin and CTX. The patient was also treated for COPD exacerbation with 125 mg Solumedrol and 33m IV Lasix for 3 days course. Continued on albuterol and duoneb treatment.  Patient's respiratory status continued to decline. She was placed on continuous BiPAP after consulting pulmonary critical care medicine. Palliative consult and deemed patient's prognosis to be poor. She changed code status to DNR and transitioned to comfort care. She was transitioned to a NRB then eventually to nasal cannula. Received a morphine gtt and ativan during these transitions. She expired [id number]  peacefully with family at bedside.

## 2019-09-01 NOTE — ED Notes (Signed)
Pt soiled bed. During cleaning, pt's 02 saturation decreased to 84%. HOB elevated and 02 saturation quickly increased to 95%.

## 2019-09-01 NOTE — Progress Notes (Signed)
FPTS Interim progress note:   Afternoon check-in.  She is doing better since this morning, reports an acute worsening in her respiratory status after exerting too much energy with repositioning on bed earlier today.  Now down to 7 L [place], briefly increased to 10 L NRB.  She appears in mild respiratory distress, tachypneic and stopping to catch her breath after 1-2 sentences.   Will continue plan as is and follow-up on any additional pulmonary recommendations.  She unfortunately has a poor long-term prognosis.  Patriciaann Clan, DO

## 2019-09-01 NOTE — ED Notes (Signed)
Tele   bfast ordered  

## 2019-09-02 ENCOUNTER — Other Ambulatory Visit (HOSPITAL_COMMUNITY): Payer: Medicaid Other

## 2019-09-02 ENCOUNTER — Inpatient Hospital Stay (HOSPITAL_COMMUNITY): Payer: Medicaid Other

## 2019-09-02 ENCOUNTER — Other Ambulatory Visit: Payer: Self-pay

## 2019-09-02 DIAGNOSIS — R609 Edema, unspecified: Secondary | ICD-10-CM | POA: Diagnosis not present

## 2019-09-02 DIAGNOSIS — Z515 Encounter for palliative care: Secondary | ICD-10-CM

## 2019-09-02 DIAGNOSIS — I350 Nonrheumatic aortic (valve) stenosis: Secondary | ICD-10-CM

## 2019-09-02 DIAGNOSIS — J9621 Acute and chronic respiratory failure with hypoxia: Secondary | ICD-10-CM | POA: Diagnosis not present

## 2019-09-02 DIAGNOSIS — D72819 Decreased white blood cell count, unspecified: Secondary | ICD-10-CM | POA: Diagnosis not present

## 2019-09-02 DIAGNOSIS — R0603 Acute respiratory distress: Secondary | ICD-10-CM

## 2019-09-02 DIAGNOSIS — K743 Primary biliary cirrhosis: Secondary | ICD-10-CM

## 2019-09-02 LAB — ECHOCARDIOGRAM COMPLETE
Height: 66 in
Weight: 3920 oz

## 2019-09-02 LAB — BRAIN NATRIURETIC PEPTIDE: B Natriuretic Peptide: 59.2 pg/mL (ref 0.0–100.0)

## 2019-09-02 LAB — COMPREHENSIVE METABOLIC PANEL
ALT: 24 U/L (ref 0–44)
AST: 32 U/L (ref 15–41)
Albumin: 2.8 g/dL — ABNORMAL LOW (ref 3.5–5.0)
Alkaline Phosphatase: 99 U/L (ref 38–126)
Anion gap: 10 (ref 5–15)
BUN: 13 mg/dL (ref 6–20)
CO2: 22 mmol/L (ref 22–32)
Calcium: 8.7 mg/dL — ABNORMAL LOW (ref 8.9–10.3)
Chloride: 105 mmol/L (ref 98–111)
Creatinine, Ser: 0.66 mg/dL (ref 0.44–1.00)
GFR calc Af Amer: >60 mL/min (ref >60)
GFR calc non Af Amer: >60 mL/min (ref >60)
Glucose, Bld: 142 mg/dL — ABNORMAL HIGH (ref 70–99)
Potassium: 3.7 mmol/L (ref 3.5–5.1)
Sodium: 137 mmol/L (ref 135–145)
Total Bilirubin: 2.2 mg/dL — ABNORMAL HIGH (ref 0.3–1.2)
Total Protein: 7.8 g/dL (ref 6.5–8.1)

## 2019-09-02 LAB — RESPIRATORY PANEL BY PCR

## 2019-09-02 LAB — PHOSPHORUS: Phosphorus: 3.1 mg/dL (ref 2.5–4.6)

## 2019-09-02 LAB — CBC
HCT: 32.3 % — ABNORMAL LOW (ref 36.0–46.0)
Hemoglobin: 9.3 g/dL — ABNORMAL LOW (ref 12.0–15.0)
MCH: 23.8 pg — ABNORMAL LOW (ref 26.0–34.0)
MCHC: 28.8 g/dL — ABNORMAL LOW (ref 30.0–36.0)
MCV: 82.6 fL (ref 80.0–100.0)
Platelets: 90 10*3/uL — ABNORMAL LOW (ref 150–400)
RBC: 3.91 MIL/uL (ref 3.87–5.11)
RDW: 16.2 % — ABNORMAL HIGH (ref 11.5–15.5)
WBC: 4.1 10*3/uL (ref 4.0–10.5)
nRBC: 0.5 % — ABNORMAL HIGH (ref 0.0–0.2)

## 2019-09-02 LAB — PROCALCITONIN: Procalcitonin: <0.1 ng/mL

## 2019-09-02 LAB — LACTIC ACID, PLASMA: Lactic Acid, Venous: 2.3 mmol/L (ref 0.5–1.9)

## 2019-09-02 LAB — MAGNESIUM: Magnesium: 1.8 mg/dL (ref 1.7–2.4)

## 2019-09-02 LAB — GLUCOSE, CAPILLARY
Glucose-Capillary: 137 mg/dL — ABNORMAL HIGH (ref 70–99)
Glucose-Capillary: 237 mg/dL — ABNORMAL HIGH (ref 70–99)
Glucose-Capillary: 289 mg/dL — ABNORMAL HIGH (ref 70–99)

## 2019-09-02 LAB — TROPONIN I (HIGH SENSITIVITY): Troponin I (High Sensitivity): 5 ng/L (ref <18)

## 2019-09-02 LAB — HEPARIN LEVEL (UNFRACTIONATED): Heparin Unfractionated: 0.17 IU/mL — ABNORMAL LOW (ref 0.30–0.70)

## 2019-09-02 MED ORDER — HEPARIN (PORCINE) 25000 UT/250ML-% IV SOLN
1650.0000 [IU]/h | INTRAVENOUS | Status: DC
  Administered 2019-09-02: 1300 [IU]/h via INTRAVENOUS
  Administered 2019-09-04: 1650 [IU]/h via INTRAVENOUS
  Filled 2019-09-02 (×3): qty 250

## 2019-09-02 MED ORDER — MORPHINE SULFATE (PF) 2 MG/ML IV SOLN
1.0000 mg | INTRAVENOUS | Status: DC | PRN
  Administered 2019-09-02: 2 mg via INTRAVENOUS
  Filled 2019-09-02: qty 1

## 2019-09-02 MED ORDER — HYDROCOD POLST-CPM POLST ER 10-8 MG/5ML PO SUER: 5.0000 mL | Freq: Two times a day (BID) | ORAL | Status: DC | PRN

## 2019-09-02 MED ORDER — MAGNESIUM SULFATE 2 GM/50ML IV SOLN
2.0000 g | Freq: Once | INTRAVENOUS | Status: AC
  Administered 2019-09-02: 2 g via INTRAVENOUS
  Filled 2019-09-02: qty 50

## 2019-09-02 MED ORDER — FUROSEMIDE 10 MG/ML IJ SOLN
40.0000 mg | Freq: Once | INTRAMUSCULAR | Status: AC
  Administered 2019-09-02: 40 mg via INTRAVENOUS
  Filled 2019-09-02: qty 4

## 2019-09-02 MED ORDER — HYDROXYZINE HCL 10 MG PO TABS
10.0000 mg | ORAL_TABLET | Freq: Once | ORAL | Status: AC
  Administered 2019-09-02: 10 mg via ORAL
  Filled 2019-09-02 (×3): qty 1

## 2019-09-02 MED ORDER — MORPHINE SULFATE (PF) 2 MG/ML IV SOLN
1.0000 mg | INTRAVENOUS | Status: DC | PRN
  Administered 2019-09-04: 2 mg via INTRAVENOUS

## 2019-09-02 MED ORDER — METHYLPREDNISOLONE SODIUM SUCC 125 MG IJ SOLR
80.0000 mg | Freq: Three times a day (TID) | INTRAMUSCULAR | Status: DC
  Administered 2019-09-02 – 2019-09-04 (×6): 80 mg via INTRAVENOUS
  Filled 2019-09-02 (×6): qty 2

## 2019-09-02 MED ORDER — LORAZEPAM 2 MG/ML IJ SOLN: 0.2500 mg | INTRAMUSCULAR | Status: DC | PRN

## 2019-09-02 MED ORDER — HYDROCOD POLST-CPM POLST ER 10-8 MG/5ML PO SUER
5.0000 mL | Freq: Four times a day (QID) | ORAL | Status: DC
  Administered 2019-09-02 – 2019-09-03 (×6): 5 mL via ORAL
  Filled 2019-09-02 (×6): qty 5

## 2019-09-02 MED ORDER — GUAIFENESIN ER 600 MG PO TB12
1200.0000 mg | ORAL_TABLET | Freq: Two times a day (BID) | ORAL | Status: DC
  Administered 2019-09-02 – 2019-09-03 (×4): 1200 mg via ORAL
  Filled 2019-09-02 (×4): qty 2

## 2019-09-02 MED ORDER — FUROSEMIDE 10 MG/ML IJ SOLN
40.0000 mg | Freq: Three times a day (TID) | INTRAMUSCULAR | Status: DC
  Administered 2019-09-02 – 2019-09-05 (×7): 40 mg via INTRAVENOUS
  Filled 2019-09-02 (×8): qty 4

## 2019-09-02 MED ORDER — IPRATROPIUM-ALBUTEROL 0.5-2.5 (3) MG/3ML IN SOLN
3.0000 mL | Freq: Four times a day (QID) | RESPIRATORY_TRACT | Status: DC
  Administered 2019-09-04: 08:00:00 3 mL via RESPIRATORY_TRACT
  Filled 2019-09-02 (×3): qty 3

## 2019-09-02 MED ORDER — IPRATROPIUM-ALBUTEROL 0.5-2.5 (3) MG/3ML IN SOLN: 3.0000 mL | RESPIRATORY_TRACT | Status: DC

## 2019-09-02 NOTE — Progress Notes (Signed)
RT note: RT ands RN transported Bipap patient from 6N to 4E 24. Vital signs stable through out.

## 2019-09-02 NOTE — Progress Notes (Signed)
NAME:  Holly Mueller, MRN:  [id number], DOB:  1965-09-28, LOS: 1 ADMISSION DATE:  08/14/2019, CONSULTATION DATE:  4/3 REFERRING MD:  Dr. Nori Mueller / FPTS, CHIEF COMPLAINT:  Dyspnea    Brief History    54 y/o F who presented to [facility] on 4/22 with worsening shortness of breath.    She is followed by Dr. Chase Mueller for IPAF ( a type of ILD), hx of asthma, restrictive lung disease and chronic respiratory failure.  She has known chronic cough, GERD, biliary cholangitis, hepatic cirrhosis, pancytopenia.  Her radiographic images support are consistent with UIP.  She is on 3-4L O2 at baseline.  The patient was treated for bacterial sinusitis on 4/8 with Augmentin which she completed.  She reports she has had progressive shortness of breath over the past two weeks.  She saw Holly Barrow, NP on 4/19 for an acute visit for worsening shortness of breath and low O2 levels with exertion.  O2 was increased to 3-4L at that time and she was given a prednisone taper.  She notes she has had intermittent diarrhea in the last few days and lower extremity swelling.  The evening prior to presentation, she ambulated to the bathroom and became so short of breath she was unable to recover and called 911. She was recently prescribed Esbriet at reduced dose due to her underlying liver disease but has not started it at this time.  In the ER, the patient reported shortness of breath.  She was treated with IV steroids, magnesium, albuterol and nebulized bronchodilators. She required NRB during episodes of exertion in the ER.  However, at rest she was on 4-5L O2. CXR showed mixed bilateral interstitial and mutli-focal patchy consolidative opacities. She reports she has had elevated glucose due to prednisone use.   PCCM consulted for evaluation.   Past Medical History  IPAF / ILD -baseline 3-4L, positive ANA, SSA.  Primary Biliary Cholangitis  Hepatic Cirrhosis secondary to Primary Bilary Cholangitis  Gastric Varices  GERD DM  CKD    Epilepsy  Pancytopenia   Significant Hospital Events   4/22 Admit  4/23 PCCM consulted  - Pt reports she knows "it will take a while to feel better"  RN reports pt desaturated to mid 70's with exertion / turning in the bed  Consults:  PCCM   Procedures:    Significant Diagnostic Tests:   CXR 4/23 >> bilateral mixed interstitial and alveolar infiltrates   Micro Data:  COVID 4/22 >> negative  Influenza A/B 4/22 >> negative  RVP negative  Antimicrobials:  Rocephin 4/22 >> Azithro 4/22 >>   Interim history/subjective:  09/02/2019 - this morning in 6n14. All night restless with cough. Needing NRB . Cough severe with desaturations during cough. At time of my arrival 97% on NRB and mildly labored.   RN t hingks patient cannot get CT because of inability to lie flat.  Did not take tussionex because In Jan 2021. D-dimer 0.86  (baseline for her and PE ruled out march 2021). Wheezing and crackles  Objective   Blood pressure (!) 112/54, pulse (!) 118, temperature 98 F (36.7 C), temperature source Axillary, resp. rate (!) 21, height _0  (1.676 m), weight 111.1 kg, SpO2 98 %.        Intake/Output Summary (Last 24 hours) at 09/02/2019 0815 Last data filed at 09/02/2019 0550 Gross per 24 hour  Intake 125 ml  Output 3700 ml  Net -3575 ml   Filed Weights   08/30/2019 2106  Weight:  111.1 kg    Examination: General Appearance:  Looksunwell. Mildly labored Head:  Normocephalic, without obvious abnormality, atraumatic Eyes:  PERRL - yes, conjunctiva/corneas - muddy     Ears:  Normal external ear canals, both ears Nose:  G tube - no but has face mask Throat:  ETT TUBE - no , OG tube - no. FACE MASK Neck:  Supple,  No enlargement/tenderness/nodules Lungs: Mildly tachypenic. Extensive crackles and wheeze Heart:  S1 and S2 normal, no murmur, CVP - no.  Pressors - no Abdomen:  Soft, no masses, no organomegaly Genitalia / Rectal:  Not done Extremities:  Extremities- iontact Skin:   ntact in exposed areas . Sacral area - not examined. SPIDER NEVII + Neurologic:  Sedation - none -> RASS - +1 . Moves all 4s - yes. CAM-ICU - neg . Orientation - x3+      Resolved Hospital Problem list      Assessment & Plan:  Hx of asthma Idiopathic Pulmonary Autoimmune Fibrosis - IPAF Acute on Chronic Hypoxic Respiratory Failure Significant symptom burden  UIP on radiographic appearance, ANA, SSA positive.  Baseline 3-4L O2.  Negative COVID, influenza.  PCt neative   Concern is for acute on chroic diast dysfn, ILD fare and "asthma flare", Doubt PE given baseline d-dimer 0.8. Doubt sepsis given PCT < 0.1 Worse this AM with cough and dyspnea   Plan  - change to bipap but no intubation, no CPR (extensive discussion with her x 2 by myself with RN halle witness,  Later independently confirmed by resident. Also d/w Holly Mueller)  - increase cough suppression  - increase steroids - change breo to nebs  - increase lasix - dc antibiotics  -a wait ECHO and duplex - get RUQ [place]   Get HRCT when more stable if possibl;e -  NO CPR No DEFIB. NO ACELS. NO INTUBATIION  Might not survive this admssion    Best practice:  Per FPTS NPO for now  ATTESTATION & SIGNATURE   The patient Holly Mueller is critically ill with multiple organ systems failure and requires high complexity decision making for assessment and support, frequent evaluation and titration of therapies, application of advanced monitoring technologies and extensive interpretation of multiple databases.   Critical Care Time devoted to patient care services described in this note is  45  Minutes. This time reflects time of care of this signee Dr Brand Males. This critical care time does not reflect procedure time, or teaching time or supervisory time of PA/NP/Med student/Med Resident etc but could involve care discussion time     Dr. Brand Males, M.D., [facility].C.P Pulmonary and Critical Care Medicine Staff  Physician [place] [facility] Pager: [phone], If no answer or between  15:00h - 7:00h: call [phone]  09/02/2019 8:21 AM    LABS    PULMONARY Recent Labs  Lab 09/01/19 1231  PHART 7.421  PCO2ART 31.7*  PO2ART 64*  HCO3 20.6  TCO2 22  O2SAT 93.0    CBC Recent Labs  Lab 08/29/2019 2130 09/01/19 0338 09/01/19 1231  HGB 7.7* 7.7* 8.2*  HCT 26.3* 26.0* 24.0*  WBC 1.7* 1.4*  --   PLT 60* 54*  --     COAGULATION No results for input(s): INR in the last 168 hours.  CARDIAC  No results for input(s): TROPONINI in the last 168 hours. No results for input(s): PROBNP in the last 168 hours.   CHEMISTRY Recent Labs  Lab 08/30/2019 2130 08/15/2019 2130 09/01/19 0338 09/01/19 1231  NA 133*  --  136 138  K 3.9   < > 4.2 4.6  CL 106  --  108  --   CO2 20*  --  18*  --   GLUCOSE 262*  --  372*  --   BUN 11  --  10  --   CREATININE 0.64  --  0.75  --   CALCIUM 8.1*  --  8.1*  --    < > = values in this interval not displayed.   Estimated Creatinine Clearance: 101.5 mL/min (by C-G formula based on SCr of 0.75 mg/dL).   LIVER Recent Labs  Lab 09/01/19 0338  AST 33  ALT 23  ALKPHOS 122  BILITOT 1.1  PROT 6.6  ALBUMIN 2.4*     INFECTIOUS Recent Labs  Lab 09/01/19 0022 09/01/19 0338  PROCALCITON <0.10 <0.10     ENDOCRINE CBG (last 3)  Recent Labs    09/01/19 1834 09/01/19 2104 09/02/19 0807  GLUCAP 296* 309* 137*         IMAGING x48h  - image(s) personally visualized  -   highlighted in bold DG Chest 2 View  Result Date: 08/27/2019 CLINICAL DATA:  Shortness of breath with exertion for 1 week, central chest pain EXAM: CHEST - 2 VIEW COMPARISON:  CT 07/14/2019, radiograph 07/13/2019 FINDINGS: Lung volumes are diminished. There are diffuse mixed hazy interstitial and more patchy consolidative opacity throughout both lungs. No pneumothorax or visible effusion. Cardiomediastinal contours are similar to  prior with borderline cardiomegaly. No acute osseous or soft tissue abnormality. Degenerative changes are present in the imaged spine and shoulders. Telemetry leads overlie the chest. IMPRESSION: Bilateral mixed interstitial and multifocal patchy consolidative opacity throughout both lungs. Findings may reflect multifocal pneumonia versus mixed interstitial and alveolar edema. Electronically Signed   By: Lovena Le M.D.   On: 08/26/2019 21:49

## 2019-09-02 NOTE — Significant Event (Signed)
Rapid Response Event Note  Overview: Time Called: 0716 Event Type: Respiratory, Cardiac   Patient with chronic lung disease.  Per staff her oxygen level drops with activity and she recovers after a few minutes on the NRB Over night she has had frequent "coughing fit" and is requiring NRB at rest and occasionally additional Oxygen via nasal canula.    Initial Focused Assessment: She had a run of SVT  Upon my arrival she is fatigued and has significant increased work of breathing.  Lung sounds with crackles.  Heart tones regular.  BP 154/64  ST 115  RR 36  O2 sat 83-90% on NRB  Interventions: Repositioned patient Increased flow on NRB to 15L Patient a little more comfortable. 35m lasix IV  Dr RChase Callerat bedside to assess patient. Oral care done Placed patient on Bipap 12/8  FiO2 100%, weaned to 75% O2 sats 94-98% PCXR done Labs drawn  Patient is tolerating Bipap but is already asking when the mask can be removed.  Encouraged her to continue wearing the mask.    Dr RChase Callerspoke with the patient and her sister: code status now no CPR and Do not intubate.  Transferred to 4E25 via bed with O2 via Bipap RN at bedside to receive patient.   Plan of Care (if not transferred):  Event Summary:   at      at       Event End Time: 0[name] [name]

## 2019-09-02 NOTE — Progress Notes (Signed)
FPTS Interim Progress Note  Patient seen for PM check and for PCP social visit.  Patient's brothers at bedside.  Patient on nonrebreather on examination and having labored breathing.  She is awake, alert, recognizes this provider, is thankful for the visit.  States that she is very tired and that she has not slept in a few days because of her coughing.  States that a different brother visited her yesterday and that her sister is on her way.  Patient's brothers ask what is currently wrong with the patient.  Discussed with the brothers that patient chronically is very sick, and now is having worsening in breathing.  Advised that there is concerned she possibly has a PE, but we are unable to confirm given her inability to lie flat.  Currently she is on a heparin drip, but this may not improve the patient's symptoms.  The plan at present after discussion with palliative and patient sister is to continue with heparin drip and see if she improves.  They were thankful for this clarification.  Patient is currently on Tussionex, Mucinex, morphine as needed for shortness of breath.  Will continue with the supportive care.  [place], DO 09/02/2019, 9:39 PM PGY-2, [facility] pager [phone]

## 2019-09-02 NOTE — Progress Notes (Signed)
Took patient off of BiPAP and placed on NRB per MD's order.SATS 91%, will continue to monitor patient.

## 2019-09-02 NOTE — Progress Notes (Signed)
Pt with upper airway wheeze post neb tx, but appears more comfortable than previously. RT will continue to monitor.

## 2019-09-02 NOTE — Progress Notes (Signed)
Pt unable to catch her breath due to continues coughing.  Pt O2 decreases to low 80's on [place]. NRB is placed over [place], O2 stays on low 90's. VS as follows; T= 98.4 orally, P=102bpm, BP=139/74, R=22-30bpm. Pt went on yellow MEWs. Called MD to reassess pt at bedside.

## 2019-09-02 NOTE — Progress Notes (Signed)
Received page from RN with concern for patient's respiratory status, increased oxygen requirments and coughing fit with desaturations. Reported to bedside in order to assess patient who was on [place] and NRB mask with obviously increased WOB. Patient stats 91% upon my entering the room. Patient reported that the cough was most bothersome for her. Ordered tussinex Lasix 40 reorderd   Transferred to progressive  ABG  Discussed with CCM, Dr. Weldon Picking, will evaluate today

## 2019-09-02 NOTE — Consult Note (Addendum)
Consultation Note Date: 09/02/2019   Patient Name: Holly Mueller  DOB: [birth year]  MRN: [id number]  Age / Sex: 54 y.o., female  PCP: Sandi Carne Bernita Raisin, DO Referring Physician: Dickie La, MD  Reason for Consultation: Establishing goals of care, Non pain symptom management and Psychosocial/spiritual support  HPI/Patient Profile: 54 y.o. female  with past medical history of cirrhosis secondary to [place], seizures, DM, CKD, arthritis, anxiety, and chronic leukopenia, and ILD with recurrent hospitalizations for respiratory failure who was admitted on 08/18/2019 with an exacerbation of her chronic lung disease. She went to the ICU but after pulmonary evaluation and discussion opted to be DNR.  She was unable to tolerate 40L of HFNC and is currently on a NRB mask with 20L.    Case discussed with Dr. Chase Caller and [facility] Medicine Team.  Clinical Assessment and Goals of Care:  I have reviewed medical records including EPIC notes, labs and imaging, received report from Dr. Tarry Kos and the [facility] Medicine Team, examined the patient, spoke on the phone with her sister Cecille Rubin and then later met at bedside with her brother Sonia Side  to discuss diagnosis prognosis, [place], EOL wishes, disposition and options.  I introduced Palliative Medicine as specialized medical care for people living with serious illness. It focuses on providing relief from the symptoms and stress of a serious illness.   Patient is having severe respiratory distress and is unable to speak with me.  When I first arrive she has just had and echocardiogram and is about to have an abdominal ultrasound.  The patient was discovered to have a DVT today and their is concern for PE despite a normal D-Dimer.  Discussed patient's current condition and potential treatment risks (thrombolytics) with patient's decision making surrogate, [name].  Cecille Rubin felt that the  risk of thrombolytics were too great and she felt she would most likely opt against them.  Dr. Chase Caller evaluated patient, echocardiogram and imaging, and spoke with GI. Further testing indicated that large PE was not probable, and consequently the risk of thrombolytic far outweighed potential benefit.  Dr. Chase Caller recommended that heparin be continued (in case there is a smaller PE) but that we also allow the family to visit as the patient is having great difficulty with respiratory distress with no relief available other than providing comfort care if current treatment is not effective.  I explained the situation to Sonia Side (brother) and the patient.  The medical team is hoping for the best but preparing for the worst.  We are concerned the patient's time may be short.  Consequently we are allowing her family to visit with unrestricted visitation status (3 at bedside and can rotate out).  In the mean time we are continuing medications (heparin) with the understanding that if there is no improvement shifting to comfort care is the next appropriate option.  Supportive listening provided.  Questions answered to the best of my ability.  Obtained a snack for patient (she has been NPO all day).    The family was encouraged  to call with questions or concerns.   Primary Decision Maker:  PATIENT and surrogate decision maker Blenda Mounts    SUMMARY OF RECOMMENDATIONS    Code Status/Advance Care Planning:  DNR   Symptom Management:   Morphine q 2 prn shortness of breath, very low dose benzodiazepine, heparin gtt  Additional Recommendations (Limitations, Scope, Preferences):  Family be allowed unrestricted visitation as we are concerned we may be nearing EOL.  Palliative Prophylaxis:   Frequent assessment for pain or shortness of breath  Psycho-social/Spiritual:   Desire for further Chaplaincy support: not discussed.  Prognosis:  Patient at high risk of acute decline and death in hours to  days    Discharge Planning: TBD      Primary Diagnoses: Present on Admission: . COPD exacerbation ([place])   I have reviewed the medical record, interviewed the patient and family, and examined the patient. The following aspects are pertinent.  Past Medical History:  Diagnosis Date  . Acute respiratory failure with hypoxia ([place]) 09/22/2018  . Anxiety    "anxiety attacks- sometimes"  . Arthritis    bursitis left hip flares-not an issue  . Chronic kidney disease   . COPD (chronic obstructive pulmonary disease) ([place])   . Diabetes mellitus without complication ([place])    Type II  . Dysrhythmia   . Edentulous    10-19-13 at present  . Gastric varices with bleeding   . GERD (gastroesophageal reflux disease) 2008  . 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 Headache(784.0)    hx of migraines   . Heart murmur   . 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 ([place])   . History of blood transfusion    gi bleed  . History of kidney stones    passed  . Hypothyroidism 2008  . Interstitial lung disease ([place])   . Leukopenia   . Lower esophageal ring 08/18/2013  . Primary biliary cholangitis ([place]) 10/24/2018  . Seasonal allergies 2003  . SEIZURES, HX OF 12/12/2009   Annotation: last seizure in early 1970s Qualifier: Diagnosis of  By: Carlena Sax  MD, Colletta [place]    . Wears glasses    Social History   Socioeconomic History  . Marital status: Single    Spouse name: Not on file  . Number of children: 2  . Years of education: Not on file  . Highest education level: Not on file  Occupational History  . Occupation: unemployed  Tobacco Use  . Smoking status: Never Smoker  . Smokeless tobacco: Never Used  . Tobacco comment: 04/25/2019- "I smoke part of a cigarette and put it out,I did not like it."  Substance and Sexual Activity  . Alcohol use: Not Currently  . Drug use: No  . Sexual activity: Not Currently  Other Topics Concern  .  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 Difficulty of Paying Living Expenses: Very hard  Food Insecurity:   . Worried About Charity fundraiser in the Last Year:   . Arboriculturist in the Last Year:   Transportation Needs:   . Film/video editor (Medical):   Marland Kitchen  Lack of Transportation (Non-Medical):   Physical Activity:   . Days of Exercise per Week:   . Minutes of Exercise per Session:   Stress:   . Feeling of Stress :   Social Connections:   . Frequency of Communication with Friends and Family:   . Frequency of Social Gatherings with Friends and Family:   . Attends Religious Services:   . Active Member of Clubs or Organizations:   . Attends Archivist Meetings:   Marland Kitchen Marital Status:    Family History  Problem Relation Age of Onset  . Hypertension Mother   . Diabetes Mother   . Cirrhosis Mother        ? medications  . Lung cancer Father   . Stroke Father   . Diabetes Sister   . Hypertension Sister   . Colon cancer Neg Hx   . Esophageal cancer Neg Hx   . Rectal cancer Neg Hx    Scheduled Meds: . chlorpheniramine-HYDROcodone  5 mL Oral Q6H  . fluticasone  2 spray Each Nare Daily  . furosemide  40 mg Intravenous Q8H  . guaiFENesin  1,200 mg Oral BID  . hydrOXYzine  10 mg Oral Once  . insulin aspart  0-15 Units Subcutaneous TID WC  . insulin aspart  0-5 Units Subcutaneous QHS  . ipratropium-albuterol  3 mL Nebulization Q4H  . levothyroxine  250 mcg Oral QAC breakfast  . loratadine  10 mg Oral Daily  . methylPREDNISolone (SOLU-MEDROL) injection  80 mg Intravenous Q8H  . pantoprazole  40 mg Oral BID AC  . umeclidinium bromide  1 puff Inhalation Daily  . ursodiol  300 mg Oral TID   Continuous Infusions: .  heparin     PRN Meds:.albuterol, chlorpheniramine-HYDROcodone, morphine injection Allergies  Allergen Reactions  . Aspirin Nausea Only and Other (See Comments)    Upsets the stomach      Vital Signs: BP 114/65 (BP Location: Left Arm)   Pulse (!) 110   Temp 98.3 F (36.8 C) (Axillary)   Resp (!) 29   Ht _0  (1.676 m)   Wt 111.1 kg   LMP  (LMP Unknown)   SpO2 (!) 88%   BMI 39.54 kg/m  Pain Scale: 0-10   Pain Score: 4    SpO2: SpO2: (!) 88 % O2 Device:SpO2: (!) 88 % O2 Flow Rate: .O2 Flow Rate (L/min): 15 L/min(max flow via flowmeter)  IO: Intake/output summary:   Intake/Output Summary (Last 24 hours) at 09/02/2019 1507 Last data filed at 09/02/2019 0855 Gross per 24 hour  Intake 125 ml  Output 3080 ml  Net -2955 ml    LBM: Last BM Date: 09/01/19 Baseline Weight: Weight: 111.1 kg Most recent weight: Weight: 111.1 kg     Palliative Assessment/Data: 20%     Time In: 4:00 Time Out: 5:10 Time Total: 70 min. Visit consisted of counseling and education dealing with the complex and emotionally intense issues surrounding the need for palliative care and symptom management in the setting of serious and potentially life-threatening illness. Greater than 50%  of this time was spent counseling and coordinating care related to the above assessment and plan.  Signed by: Florentina Jenny, PA-C Palliative Medicine  Please contact Palliative Medicine Team phone at [phone] for questions and concerns.  For individual provider: See Shea Evans

## 2019-09-02 NOTE — Progress Notes (Signed)
Bilateral lower extremity venous duplex completed. Refer to "CV Proc" under chart review to view preliminary results.  09/02/2019 1:12 PM Kelby Aline., MHA, RVT, RDCS, RDMS

## 2019-09-02 NOTE — Progress Notes (Addendum)
ANTICOAGULATION CONSULT NOTE - Initial Consult  Pharmacy Consult for Heparin Indication: DVT  Allergies  Allergen Reactions  . Aspirin Nausea Only and Other (See Comments)    Upsets the stomach    Patient Measurements: Height: _0  (167.6 cm) Weight: 111.1 kg (245 lb) IBW/kg (Calculated) : 59.3 Heparin Dosing Weight: 85.2 kg  Vital Signs: Temp: 98.3 F (36.8 C) (04/24 0855) Temp Source: Axillary (04/24 0855) BP: 114/65 (04/24 0855) Pulse Rate: 110 (04/24 0855)  Labs: Recent Labs    08/19/2019 2130 08/29/2019 2130 08/22/2019 2319 09/01/19 0338 09/01/19 0338 09/01/19 1231 09/02/19 0825  HGB 7.7*   < >  --  7.7*   < > 8.2* 9.3*  HCT 26.3*   < >  --  26.0*  --  24.0* 32.3*  PLT 60*  --   --  54*  --   --  90*  CREATININE 0.64  --   --  0.75  --   --  0.66  TROPONINIHS 4  --  5  --   --   --   --    < > = values in this interval not displayed.    Estimated Creatinine Clearance: 101.5 mL/min (by C-G formula based on SCr of 0.66 mg/dL).   Medical History: Past Medical History:  Diagnosis Date  . Acute respiratory failure with hypoxia ([place]) 09/22/2018  . Anxiety    "anxiety attacks- sometimes"  . Arthritis    bursitis left hip flares-not an issue  . Chronic kidney disease   . COPD (chronic obstructive pulmonary disease) ([place])   . Diabetes mellitus without complication ([place])    Type II  . Dysrhythmia   . Edentulous    10-19-13 at present  . Gastric varices with bleeding   . GERD (gastroesophageal reflux disease) 2008  . 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 Headache(784.0)    hx of migraines   . Heart murmur   . 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 History of blood transfusion    gi bleed  . History of kidney stones    passed  . Hypothyroidism 2008  . Interstitial lung disease ([place])   . Leukopenia   . Lower esophageal ring 08/18/2013  . Primary biliary cholangitis ([place])  10/24/2018  . Seasonal allergies 2003  . SEIZURES, HX OF 12/12/2009   Annotation: last seizure in early 1970s Qualifier: Diagnosis of  By: Carlena Sax  MD, Colletta [place]    . Wears glasses      Assessment: 54 yo F with new LLE DVT shown on doppler. Pharmacy asked to dose heparin. Patient has a history of chronic pancytopenia with baseline PLTs ~50-60s. Per discussion with family medicine team, will start IV heparin but target lower end of therapeutic goal and monitor CBC and s/sx bleeding closely. Hgb low 9.3 but improved from 7.7 yesterday. Plts also low but improved at 90 from 54 yesterday.  Goal of Therapy:  Heparin level 0.3-0.5 units/ml - no bolus given pancytopenia Monitor platelets by anticoagulation protocol: Yes   Plan:  Start heparin infusion at 1300 units/hr Check 6-hr HL  Monitor daily HL, CBC, s/sx bleeding  Richardine Service, PharmD PGY1 Pharmacy Resident Phone: [phone] 09/02/2019  3:15 PM  Please check [email or web address] for unit-specific pharmacy phone numbers.

## 2019-09-02 NOTE — Progress Notes (Signed)
  Echocardiogram 2D Echocardiogram has been performed.  Holly Mueller F 09/02/2019, 2:43 PM

## 2019-09-02 NOTE — Progress Notes (Addendum)
Pt had a run of SVT up to 180's bpm at 0714 for approximately 30secs. Pt denies cp. Instructed pt to bear down and did carotid massage. Eventually pt's HR went down to 110's. EKG obtained. Made MD aware.

## 2019-09-02 NOTE — Progress Notes (Addendum)
Family Medicine Teaching Service Daily Progress Note Intern Pager: [phone]  Patient name: Holly Mueller Medical record number: [id number] Date of birth: 1965/12/10 Age: 54 y.o. Gender: female  Primary Care Provider: Cleophas Dunker, DO Consultants: CCM Code Status: DNR  Pt Overview and Major Events to Date:  4/22 Admitted, COVID/FLU/RVP negative 4/23 CCM consulted, CXR: bilateral mixed interstitial and alveolar infiltrates  4/24: Abx discontinued, transitioned to DNR status, moved to progressive floor, placed on BIPAP, palliative consulted  Assessment and Plan: Holly Mueller is a 54 y.o. female presenting with SOB secondary to exacerbation of her chronic lung disease. 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  Acute on chronic hypoxic respiratory failure  Known ILD/Idiopathic Pulmonary Autoimmune Fibrosis (baseline O2 3-4L)  Patient has significant underlying lung disease with acute worsening.  Overnight on nonrebreather at 15L, declined BiPAP, intermittently tachypneic to mid 20s, O2 sats in 80s.  Has continued to have labored work of breathing.  Concern for possible PE, unable to verify given patient's inability to lie flat for CT.  No evidence of right heart strain on echo which is reassuring.  Patient's pulmonologist actively involved with her care, recommended starting heparin infusion without bolus after risk-benefit discussion with GI.  Discussion with palliative noted if patient continues to decline, would consider transition to comfort care.  This a.m. reports that she is feeling improved, was able to rest overnight.  At this time, patient's focus continues to be on symptomatic care.  Have been diuresing with Lasix 40 mg IV every 8 hours, UOP on 4/23 3.7 L. -Continue BiPAP/O2  -CCM consulted, appreciate  recommendations - continue and increase cough suppression: Tussionex q6 hours, Mucinex DM PRN - cont heparin gtt -Continue steroids - 30m IV q8 hours -Continue Duonebs q4 hours, cont Flonase, Incruse Ellipta QD, Claritin -Continue lasix: 454mIV q8 hours -Continue morphine 1 to 2 mg q2 hours as needed for shortness of breath - Discontinue antibiotics: CTX/Azithro (4/22-4/23) - plan to get High resolution CT when able - follow up Legionella and strep - f/u palliative consult -DNR  Acute left DVT Risk-benefit discussion with GI, opted to start heparin infusion without bolus. -plan per above  Diarrhea, acute: Resolved No BMs in last 24 hrs - continue to monitor - consider GI PCR panel if >6 bowel movements/day   DM CBGs 137-289. - hold home metformin - mSSI with meals and bedtime - if consistently elevated, consider Lantus 5 units daily -CBG monitoring with meals and at bedtime  Hepatic cirrhosis secondary to primary biliary cholangitis: chronic, stable Right upper quadrant ultrasound limited due to body habitus and bowel gas, cirrhosis without definite focal hepatic abnormality.  Home meds: Ursodiol 30077mID  - continue home meds  Hypothyroidism: chronic, stable Home meds include Synthroid 250 MCG's daily. -Continue home meds  Chronic pancytopenia: improving Follows with heme/onc as outpatient for this chronic problem.   CBC not drawn this morning despite ordering.  Will ensure that this is collected this AM. -monitor with CBC and differential  -monitor for signs of infection, fever curve and worsening cough/respiratory status   GERD Continue home Protonix 67m53mEN/GI:  Regular diet Prophylaxis: Lovenox  Disposition: Pending improvement in respiratory status, possible transition to comfort care if no improvement  Subjective:  Patient reports that she was able to rest overnight, feels better this a.m. because of this.  Still continues to feel  short of  breath.  Objective: Temp:  [98 F (36.7 C)-98.5 F (36.9 C)] 98.1 F (36.7 C) (04/25 0509) Pulse Rate:  [83-118] 83 (04/25 0509) Resp:  [20-38] 20 (04/25 0509) BP: (94-154)/(54-85) 94/60 (04/25 0509) SpO2:  [84 %-98 %] 84 % (04/25 0509) FiO2 (%):  [75 %-100 %] 100 % (04/24 1523)  Physical Exam:  General: 54 y.o. female, appears unwell, sleeping upon entering the room, arouses to verbal stimuli Cardio: RRR Lungs: Coarse breath sounds throughout all lung fields with intermittent mild expiratory wheezing, tachypnea, increased work of breathing with accessory muscle use, able to speak in short phrases Skin: warm and dry Extremities: Trace bilateral lower extremity pitting edema   Laboratory: Recent Labs  Lab 08/30/2019 2130 09/07/2019 2130 09/01/19 0338 09/01/19 1231 09/02/19 0825  WBC 1.7*  --  1.4*  --  4.1  HGB 7.7*   < > 7.7* 8.2* 9.3*  HCT 26.3*   < > 26.0* 24.0* 32.3*  PLT 60*  --  54*  --  90*   < > = values in this interval not displayed.   Recent Labs  Lab 09/01/19 0338 09/01/19 0338 09/01/19 1231 09/02/19 0825 09/03/19 0310  NA 136   < > 138 137 135  K 4.2   < > 4.6 3.7 4.4  CL 108  --   --  105 102  CO2 18*  --   --  22 24  BUN 10  --   --  13 17  CREATININE 0.75  --   --  0.66 0.77  CALCIUM 8.1*  --   --  8.7* 8.4*  PROT 6.6  --   --  7.8  --   BILITOT 1.1  --   --  2.2*  --   ALKPHOS 122  --   --  99  --   ALT 23  --   --  24  --   AST 33  --   --  32  --   GLUCOSE 372*  --   --  142* 247*   < > = values in this interval not displayed.    Imaging/Diagnostic Tests:  CHEST - 2 VIEW COMPARISON:  CT 07/14/2019, radiograph 07/13/2019 IMPRESSION: Bilateral mixed interstitial and multifocal patchy consolidative opacity throughout both lungs. Findings may reflect multifocal pneumonia versus mixed interstitial and alveolar edema.   [place], DO 09/03/2019, 6:27 AM PGY-2, [place] Intern pager: [phone], text pages  welcome

## 2019-09-02 NOTE — Significant Event (Addendum)
PCCM [facility] Note   -Reflection of conversation and also bedside evaluation in the last 1 hour.  This is a summary.   -Resident found that the left lower extremity has a new DVT.  This is a new finding compared to previous left lower extremities.  Her D-dimer is around baseline.  Her troponin yesterday and D-dimer yesterday is normal.  We did an echocardiogram this afternoon and the RV function is mildly abnormal but again consistent with previous echoes.  Based on this the pretest probability for submassive pulmonary embolism secondary to the DVT is low.  However CT angiogram chest is not possible because patient's not able to lie flat. Clinicallly she is behaving like worsening acute PE though surrogate evidence does not support it  In fact patient has significantly deteriorated from a respiratory status even since the morning.  She is progressively more hypoxemic.  She is also got respiratory fatigue.   Summary: For anticoagulation risk.  Her baseline platelet count is between 50 and 60,000 in the last 1 year.  Currently it is 94,000.  She is known to have cirrhosis.  In December 2020 she had gastric varix obliteration.  I spoke to Dr. Carmell Austria of GI.  He feels anticoagulation currently from a liver standpoint is okay but thrombolysis would be prohibitive in terms of risk.  Clin  Plan  -Start heparin infusion without bolus -discussed with Dr. Nori Riis of family practice teaching service  -No thrombolysis given uncertainty if the patient has pulmonary embolism that is submassive or massive and currently low probability of that based on surrogate evidence of chest markers and echo.  In addition thrombolysis is deemed prohibitively risky because of low platelets and cirrhosis and gastric varix history  -Oxygen to continue  -BiPAP as per previously outlined along with Lasix and steroids  -Transition to full comfort if the patient declines but already start concomitant palliative care  -Updated  Sister Cecille Rubin who is in agreement after a few back-and-forth discussions  -Updated family practice teaching service  -Updated palliative care service  -Updated patient and bedside nurse   Overall grim prognosis    Additional critical care time is over 60 minutes - resp failur acute     SIGNATURE    Dr. Brand Males, M.D., F.C.C.P,  Pulmonary and Critical Care Medicine Staff Physician, [place] Director - Interstitial Lung Disease  Program  [facility] [place], [place], [address]  Pager: [phone], If no answer or between  15:00h - 7:00h: call [phone] Telephone: [phone]  4:18 PM 09/02/2019        DG CHEST PORT 1 VIEW  Result Date: 09/02/2019 CLINICAL DATA:  Respiratory distress EXAM: PORTABLE CHEST 1 VIEW COMPARISON:  08/26/2019 FINDINGS: Lung volumes are diminished compared to the prior study. Cardiomediastinal contours are obscured by increasing airspace disease and diaphragmatic contours in the setting of diminished lung volumes. Increased interstitial opacities are seen bilaterally as well. No gross pleural effusion. Visualized skeletal structures are unremarkable. IMPRESSION: Marked worsening of interstitial and airspace opacities. Suspicious for worsening infection. Correlate with any signs of heart failure. Electronically Signed   By: Zetta Bills M.D.   On: 09/02/2019 08:55   ECHOCARDIOGRAM COMPLETE  Result Date: 09/02/2019    ECHOCARDIOGRAM REPORT   Patient Name:   Holly Mueller Date of Exam: 09/02/2019 Medical Rec #:  [id number]           Height:  66.0 in Accession #:    [id number]          Weight:       245.0 lb Date of Birth:  14-May-1965          BSA:          2.180 m Patient Age:    54 years            BP:           114/65 mmHg Patient Gender: F                   HR:           109 bpm. Exam Location:  Inpatient Procedure: 2D Echo, Cardiac Doppler and Color  Doppler Indications:    Acute Respiratory Insufficiency 518.82/R06.88  History:        Patient has prior history of Echocardiogram examinations, most                 recent 09/24/2018.  Sonographer:    Merrie Roof RDCS Referring Phys: [place]  1. Left ventricular ejection fraction, by estimation, is 60 to 65%. The left ventricle has normal function. The left ventricle has no regional wall motion abnormalities. Left ventricular diastolic parameters are consistent with Grade I diastolic dysfunction (impaired relaxation).  2. Right ventricular systolic function is normal. The right ventricular size is mildly enlarged.  3. Left atrial size was severely dilated.  4. The mitral valve is normal in structure. Trivial mitral valve regurgitation. No evidence of mitral stenosis.  5. The aortic valve is tricuspid. Aortic valve regurgitation is not visualized. Moderate aortic valve stenosis. Aortic valve area, by VTI measures 1.51 cm. Aortic valve mean gradient measures 19.5 mmHg. Aortic valve Vmax measures 3.07 m/s.  6. The inferior vena cava is normal in size with greater than 50% respiratory variability, suggesting right atrial pressure of 3 mmHg. FINDINGS  Left Ventricle: Left ventricular ejection fraction, by estimation, is 60 to 65%. The left ventricle has normal function. The left ventricle has no regional wall motion abnormalities. The left ventricular internal cavity size was normal in size. There is  no left ventricular hypertrophy. Left ventricular diastolic parameters are consistent with Grade I diastolic dysfunction (impaired relaxation). Right Ventricle: The right ventricular size is mildly enlarged. No increase in right ventricular wall thickness. Right ventricular systolic function is normal. Left Atrium: Left atrial size was severely dilated. Right Atrium: Right atrial size was normal in size. Pericardium: There is no evidence of pericardial effusion. Mitral Valve: The mitral valve is  normal in structure. Normal mobility of the mitral valve leaflets. Trivial mitral valve regurgitation. No evidence of mitral valve stenosis. Tricuspid Valve: The tricuspid valve is normal in structure. Tricuspid valve regurgitation is not demonstrated. No evidence of tricuspid stenosis. Aortic Valve: The aortic valve is tricuspid. . There is moderate thickening and moderate calcification of the aortic valve. Aortic valve regurgitation is not visualized. Moderate aortic stenosis is present. There is moderate thickening of the aortic valve. There is moderate calcification of the aortic valve. Aortic valve mean gradient measures 19.5 mmHg. Aortic valve peak gradient measures 37.7 mmHg. Aortic valve area, by VTI measures 1.51 cm. Pulmonic Valve: The pulmonic valve was normal in structure. Pulmonic valve regurgitation is not visualized. No evidence of pulmonic stenosis. Aorta: The aortic root is normal in size and structure. Venous: The inferior vena cava is normal in size with greater than 50% respiratory variability, suggesting right atrial pressure of  3 mmHg. IAS/Shunts: No atrial level shunt detected by color flow Doppler.  LEFT VENTRICLE PLAX 2D LVIDd:         4.70 cm      Diastology LVIDs:         3.10 cm      LV e' lateral:   18.20 cm/s LV PW:         1.00 cm      LV E/e' lateral: 5.5 LV IVS:        0.90 cm      LV e' medial:    11.30 cm/s LVOT diam:     2.00 cm      LV E/e' medial:  8.8 LV SV:         85 LV SV Index:   39 LVOT Area:     3.14 cm  LV Volumes (MOD) LV vol d, MOD A2C: 119.0 ml LV vol d, MOD A4C: 110.0 ml LV vol s, MOD A2C: 44.1 ml LV vol s, MOD A4C: 48.4 ml LV SV MOD A2C:     74.9 ml LV SV MOD A4C:     110.0 ml LV SV MOD BP:      68.6 ml RIGHT VENTRICLE RV Basal diam:  4.50 cm RV Mid diam:    4.60 cm LEFT ATRIUM              Index       RIGHT ATRIUM           Index LA diam:        2.80 cm  1.28 cm/m  RA Area:     15.90 cm LA Vol (A2C):   176.0 ml 80.74 ml/m RA Volume:   38.80 ml  17.80 ml/m LA  Vol (A4C):   122.0 ml 55.96 ml/m LA Biplane Vol: 151.0 ml 69.27 ml/m  AORTIC VALVE AV Area (Vmax):    1.58 cm AV Area (Vmean):   1.52 cm AV Area (VTI):     1.51 cm AV Vmax:           307.00 cm/s AV Vmean:          208.500 cm/s AV VTI:            0.560 m AV Peak Grad:      37.7 mmHg AV Mean Grad:      19.5 mmHg LVOT Vmax:         154.00 cm/s LVOT Vmean:        101.000 cm/s LVOT VTI:          0.269 m LVOT/AV VTI ratio: 0.48  AORTA Ao Root diam: 2.70 cm Ao Asc diam:  3.20 cm MITRAL VALVE MV Area (PHT): 5.97 cm     SHUNTS MV Decel Time: 127 msec     Systemic VTI:  0.27 m MV E velocity: 100.00 cm/s  Systemic Diam: 2.00 cm MV A velocity: 142.00 cm/s MV E/A ratio:  0.70 Candee Furbish MD Electronically signed by Candee Furbish MD Signature Date/Time: 09/02/2019/3:18:22 PM    Final    VAS [place] LOWER EXTREMITY VENOUS (DVT)  Result Date: 09/02/2019  Lower Venous DVTStudy Indications: Edema, and SOB.  Limitations: Poor ultrasound/tissue interface and patient sitting upright due to SOB. Comparison Study: 11/07/2018- negative lower extremity venous duplex Performing Technologist: Maudry Mayhew MHA, RDMS, RVT, RDCS  Examination Guidelines: A complete evaluation includes B-mode imaging, spectral Doppler, color Doppler, and power Doppler as needed of all accessible portions of each vessel. Bilateral testing is considered  an integral part of a complete examination. Limited examinations for reoccurring indications may be performed as noted. The reflux portion of the exam is performed with the patient in reverse Trendelenburg.  +---------+---------------+---------+-----------+----------+--------------+ RIGHT    CompressibilityPhasicitySpontaneityPropertiesThrombus Aging +---------+---------------+---------+-----------+----------+--------------+ FV Prox  Full                                                        +---------+---------------+---------+-----------+----------+--------------+ FV Mid   Full                                                         +---------+---------------+---------+-----------+----------+--------------+ FV DistalFull                                                        +---------+---------------+---------+-----------+----------+--------------+ PFV      Full                                                        +---------+---------------+---------+-----------+----------+--------------+ POP      Full           Yes      Yes                                 +---------+---------------+---------+-----------+----------+--------------+ PTV      Full                                                        +---------+---------------+---------+-----------+----------+--------------+ PERO     Full                                                        +---------+---------------+---------+-----------+----------+--------------+   Right Technical Findings: Not visualized segments include CFV, SFJ.  +---------+---------------+---------+-----------+----------+--------------+ LEFT     CompressibilityPhasicitySpontaneityPropertiesThrombus Aging +---------+---------------+---------+-----------+----------+--------------+ FV Prox  Full                    Yes                                 +---------+---------------+---------+-----------+----------+--------------+ FV Mid   Full                                                        +---------+---------------+---------+-----------+----------+--------------+  FV DistalFull                                                        +---------+---------------+---------+-----------+----------+--------------+ PFV      Full                                                        +---------+---------------+---------+-----------+----------+--------------+ POP      Full           Yes      Yes                                 +---------+---------------+---------+-----------+----------+--------------+ PTV       Full                    Yes                                 +---------+---------------+---------+-----------+----------+--------------+ PERO     None                    No                   Acute          +---------+---------------+---------+-----------+----------+--------------+   Left Technical Findings: Not visualized segments include CFV, SFJ.   Summary: RIGHT: - There is no evidence of deep vein thrombosis in the lower extremity. However, portions of this examination were limited- see technologist comments above.  - No cystic structure found in the popliteal fossa.  LEFT: - Findings consistent with acute deep vein thrombosis involving the left peroneal veins. - No cystic structure found in the popliteal fossa.  *See table(s) above for measurements and observations.    Preliminary                                                                                                                                                                         Results for Holly, POSTMA "[name]" (MRN [id number]) as of 09/02/2019 16:01  Ref. Range 03/12/2018 10:21 08/28/2018 06:46 06/26/2019 17:55 09/02/2019 21:30 09/01/2019 18:18  B Natriuretic Peptide Latest Ref Range: 0.0 - 100.0 pg/mL 6.6 9.3 27.6 88.7 96.1  Results for Holly, GABA [name] "[name]" (MRN [id number]) as of 09/02/2019  16:01  Ref. Range 05/22/2019 17:19 06/26/2019 12:53 06/26/2019 14:50 08/17/2019 21:30 08/21/2019 23:19  Troponin I (High Sensitivity) Latest Ref Range: <18 ng/L _0 5

## 2019-09-02 NOTE — Progress Notes (Signed)
Pt did not tolerate heated High flow and O2, placed back on NRB with max flow on O2 meter.  O2 sats 91-95%

## 2019-09-02 NOTE — Progress Notes (Signed)
Post 0400 neb tx, pt began coughing, increased WOB, increased SOB. Pt placed NRB over [place] so she could catch her breath. Pt with improved aeration throughout, but increased crackles noted on L. RT will continue to monitor.

## 2019-09-02 NOTE — Progress Notes (Signed)
BIPAP to start after pt is transferred to progressive floor. Md aware.

## 2019-09-02 NOTE — Progress Notes (Addendum)
Family Medicine Teaching Service Daily Progress Note Intern Pager: [phone]  Patient name: Holly Mueller Medical record number: [id number] Date of birth: July 17, 1965 Age: 54 y.o. Gender: female  Primary Care Provider: Cleophas Dunker, DO Consultants: CCM Code Status: DNR  Pt Overview and Major Events to Date:  4/22 Admitted, COVID/FLU/RVP negative 4/23 CCM consulted, CXR: bilateral mixed interstitial and alveolar infiltrates  4/24: Abx discontinued, transitioned to DNR status, moved to progressive floor, placed on BIPAP, palliative consulted  Assessment and Plan: Holly Mueller is a 54 y.o. female presenting with SOB secondary to exacerbation of her chronic lung disease. 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  Acute on chronic hypoxic respiratory failure  Known ILD/Idiopathic Pulmonary Autoimmune Fibrosis (baseline O2 3-4L)  Continues to have worsening respiratory failure and cough overnight with increased work of breathing requiring higher levels of O2 via NRB.  CCM consulted yesterday who recommended further lab work and evaluation for PE (resp viral panel, urine strep and legionella, d-dimer, duplex, and echo, HCRT).  Unable to get CT due to inability to lay flat. D-dimer 0.86 which is reassuring (baseline for her and PE ruled out march 2021).  Reassuring that unlikely symptoms secondary to PE.  Resp viral panel negative. Legionella and strep pending. Echo and duplex pending. High res CT pending. CCM saw patient this morning.  Patient is transition to DNR status - we will not plan to intubate if acutely worsening.  She has been placed on BiPAP this morning and transferred to progressive floor. Antibiotics discontinued.  Focus going forward will be supportive care. -Continue BiPAP/O2  -CCM consulted, appreciate  recommendations - continue and increase cough suppression: Tussionex q6 hours, Mucinex DM PRN - Increase steroids - 27m IV q8 hours - Stop Breo, start Duonebs q4 hours, cont Flonase, Incruse Ellipta QD, Claritin - Increase lasix: 449mIV q8 hours - IV mag 2g IV x 1 to help with breathing - Discontinue antibiotics: CTX/Azithro (4/22-4/23) - plan to get High resolution CT when able - per CCM, obtain RUQ u/s to evaluate cirrhosis - follow up Echo and duplex - follow up Legionella and strep - palliative consult - NPO  Diarrhea, acute  3 bowel movements overnight.  - continue to monitor - NPO,. Sips with meds  - consider GI PCR panel if >6 bowel movements/day   DM CBG 140's overnight.  - hold home metformin - sSSI with meals and bedtime -CBG monitoring with meals and at bedtime  Hepatic cirrhosis secondary to primary biliary cholangitis: chronic, stable Home meds: Ursodiol 30024mID  - per CCM, obtain RUQ ultrasound - continue home meds  Hypothyroidism: chronic, stable Home meds include Synthroid 250 MCG's daily. -Continue home meds  Chronic pancytopenia: improving Follows with heme/onc as outpatient for this chronic problem.  White blood cell count 4.1, hemoglobin 9.3, platelets 54>90  -monitor with CBC and differential  -monitor for signs of infection, fever curve and worsening cough/respiratory status   GERD Continue home Protonix 52m10mEN/GI: NPO, sips with meds Prophylaxis: Lovenox  Disposition: Home pending medically stable for discharge  Subjective:  Patient struggling to breathe this morning.  On BiPAP during exam.  Intermittently coughing that is wet sounding.  Breathing hard.  Confirms that she would not like to have CPR or intubation and agrees to DNR status.  Nurse at bedside was present at time of this conversation.  Objective: Temp:  [  97.5 F (36.4 C)-98.6 F (37 C)] 98.3 F (36.8 C) (04/24 0855) Pulse Rate:  [61-118] 110 (04/24 0855) Resp:   [17-38] 29 (04/24 0855) BP: (112-154)/(54-78) 114/65 (04/24 0855) SpO2:  [88 %-99 %] 93 % (04/24 1120) FiO2 (%):  [75 %-100 %] 100 % (04/24 1120) Physical Exam: General: Appears very unwell, moderate acute distress.   CV: Loud heart sounds, difficult to appreciate murmurs with BiPAP Lungs: Mildly tachypneic, increased work of breathing on BiPAP, extensive crackles and wheezes throughout  Abdomen: soft, non-tende Skin: warm, dry  Laboratory: Recent Labs  Lab 08/13/2019 2130 09/02/2019 2130 09/01/19 0338 09/01/19 1231 09/02/19 0825  WBC 1.7*  --  1.4*  --  4.1  HGB 7.7*   < > 7.7* 8.2* 9.3*  HCT 26.3*   < > 26.0* 24.0* 32.3*  PLT 60*  --  54*  --  90*   < > = values in this interval not displayed.   Recent Labs  Lab 08/10/2019 2130 09/08/2019 2130 09/01/19 0338 09/01/19 1231 09/02/19 0825  NA 133*   < > 136 138 137  K 3.9   < > 4.2 4.6 3.7  CL 106  --  108  --  105  CO2 20*  --  18*  --  22  BUN 11  --  10  --  13  CREATININE 0.64  --  0.75  --  0.66  CALCIUM 8.1*  --  8.1*  --  8.7*  PROT  --   --  6.6  --  7.8  BILITOT  --   --  1.1  --  2.2*  ALKPHOS  --   --  122  --  99  ALT  --   --  23  --  24  AST  --   --  33  --  32  GLUCOSE 262*  --  372*  --  142*   < > = values in this interval not displayed.    Imaging/Diagnostic Tests:  CHEST - 2 VIEW COMPARISON:  CT 07/14/2019, radiograph 07/13/2019 IMPRESSION: Bilateral mixed interstitial and multifocal patchy consolidative opacity throughout both lungs. Findings may reflect multifocal pneumonia versus mixed interstitial and alveolar edema.   Mina Marble [place], DO 09/02/2019, 11:41 AM PGY-2, [place] Intern pager: [phone], text pages welcome

## 2019-09-02 NOTE — Progress Notes (Signed)
Received Holly Mueller for 6 N.  Report received pta.  Pt is awake, alert and oriented x 4 .  Pt is currently on cpap, RT at bedside.  O2 Sat 97 %.  RR 38 , regular, in NAD.  Placed on cardiac monitor, registered with CCMD.  Oriented to room.

## 2019-09-02 NOTE — Progress Notes (Signed)
RT note: RT called to rapid response. RT placed patient on BIPAP for distress and desat. Patient tolerating well at this time. MD and Rapid response nurse at bedside.

## 2019-09-02 NOTE — Progress Notes (Signed)
  Telephone note:  Called by Dr. Chase Caller regarding thrombolytics/anticoagulation.  I have reviewed the chart.  54 year old with advanced liver cirrhosis d/t PBC with portal hypertension-Gd II esophageal, gastric varices s/p cyanoacrylate glue injection x 2, last 04/26/2019, splenomegaly, pancytopenia including significant thrombocytopenia now with PE.  Although, a higher risk than baseline, okay to anticoagulate if needed.  Thrombolytics would be contraindicated given advanced liver dz and very high risk for potential bleeding.  Do recommend nonspecific beta-blocker-nadolol/propranolol or Coreg only if clinical status allows for variceal bleed prophylaxis.  Please call us if we can be of any assistance.  Available to answer any ?Marland Kitchen  My cell phone is listed below.   Carmell Austria, MD Velora Heckler Fabienne Bruns [phone].

## 2019-09-02 NOTE — Progress Notes (Signed)
Transported patient  to 4 E 25  Accompanied by Rapid Response Nurse and RT. Transport uneventful.

## 2019-09-02 NOTE — Progress Notes (Signed)
MD at bedside due to pt increased work of breathing. This RN suggested that pt needed higher lever of care. Transfer order to progressive care has been ordered.

## 2019-09-02 NOTE — Progress Notes (Signed)
Placed patient on heated high flow nasal cannula per MD's request, placed on 40L/M and 100%, SATS 87%, will continue to monitor patient.

## 2019-09-02 NOTE — Progress Notes (Signed)
Pt refused bipap at this time. She may want it later.

## 2019-09-03 DIAGNOSIS — K743 Primary biliary cirrhosis: Secondary | ICD-10-CM | POA: Diagnosis not present

## 2019-09-03 DIAGNOSIS — D72819 Decreased white blood cell count, unspecified: Secondary | ICD-10-CM | POA: Diagnosis not present

## 2019-09-03 DIAGNOSIS — J9621 Acute and chronic respiratory failure with hypoxia: Secondary | ICD-10-CM | POA: Diagnosis not present

## 2019-09-03 LAB — CBC
HCT: 28.5 % — ABNORMAL LOW (ref 36.0–46.0)
Hemoglobin: 8.5 g/dL — ABNORMAL LOW (ref 12.0–15.0)
MCH: 24.8 pg — ABNORMAL LOW (ref 26.0–34.0)
MCHC: 29.8 g/dL — ABNORMAL LOW (ref 30.0–36.0)
MCV: 83.1 fL (ref 80.0–100.0)
Platelets: 76 10*3/uL — ABNORMAL LOW (ref 150–400)
RBC: 3.43 MIL/uL — ABNORMAL LOW (ref 3.87–5.11)
RDW: 15.9 % — ABNORMAL HIGH (ref 11.5–15.5)
WBC: 3 10*3/uL — ABNORMAL LOW (ref 4.0–10.5)
nRBC: 0.7 % — ABNORMAL HIGH (ref 0.0–0.2)

## 2019-09-03 LAB — BASIC METABOLIC PANEL
Anion gap: 9 (ref 5–15)
BUN: 17 mg/dL (ref 6–20)
CO2: 24 mmol/L (ref 22–32)
Calcium: 8.4 mg/dL — ABNORMAL LOW (ref 8.9–10.3)
Chloride: 102 mmol/L (ref 98–111)
Creatinine, Ser: 0.77 mg/dL (ref 0.44–1.00)
GFR calc Af Amer: >60 mL/min (ref >60)
GFR calc non Af Amer: >60 mL/min (ref >60)
Glucose, Bld: 247 mg/dL — ABNORMAL HIGH (ref 70–99)
Potassium: 4.4 mmol/L (ref 3.5–5.1)
Sodium: 135 mmol/L (ref 135–145)

## 2019-09-03 LAB — HEPARIN LEVEL (UNFRACTIONATED)
Heparin Unfractionated: 0.23 IU/mL — ABNORMAL LOW (ref 0.30–0.70)
Heparin Unfractionated: 0.2 IU/mL — ABNORMAL LOW (ref 0.30–0.70)

## 2019-09-03 LAB — GLUCOSE, CAPILLARY
Glucose-Capillary: 227 mg/dL — ABNORMAL HIGH (ref 70–99)
Glucose-Capillary: 214 mg/dL — ABNORMAL HIGH (ref 70–99)
Glucose-Capillary: 245 mg/dL — ABNORMAL HIGH (ref 70–99)
Glucose-Capillary: 203 mg/dL — ABNORMAL HIGH (ref 70–99)

## 2019-09-03 LAB — MAGNESIUM: Magnesium: 2.1 mg/dL (ref 1.7–2.4)

## 2019-09-03 LAB — PHOSPHORUS: Phosphorus: 4.5 mg/dL (ref 2.5–4.6)

## 2019-09-03 MED ORDER — MORPHINE SULFATE (PF) 2 MG/ML IV SOLN
1.0000 mg | INTRAVENOUS | Status: DC
  Administered 2019-09-03 – 2019-09-04 (×6): 1 mg via INTRAVENOUS
  Filled 2019-09-03 (×7): qty 1

## 2019-09-03 NOTE — Progress Notes (Addendum)
Daily Progress Note   Patient Name: Holly Mueller       Date: 09/03/2019 DOB: 1965/08/14  Age: 54 y.o. MRN#: [id number] Attending Physician: Dickie La, MD Primary Care Physician: Cleophas Dunker, DO Admit Date: 09/01/2019  Reason for Consultation/Follow-up: Establishing goals of care, Non pain symptom management and Psychosocial/spiritual support     Subjective: Sat with patient at bedside.  She is on NRB mask with oxygen turned completely up.  She is coughing (which causes pain).  She states she slept well last night and is pleased that she is still here (alive). Happy that she spent time with her sons last night. Breakfast arrived.  She took of the NRB mask in order to attempt to eat.  It was replaced with N/C with oxygen turned completely up (20L?).  Her sats immediately dropped to 52% and stayed there.  Patient's lips turned blue and she dropped her forehead to her hand as though in pain.  I offered to replace the NRB mask but Holly Mueller wanted to try to eat her oatmeal.     Assessment: Patient with advanced ILD and cirrhosis.  Concerned that she is in a situation that can not be sustained outside of the hospital.  I remain hopeful for improvement but very concerned that we may be nearing EOL in this young woman if no improvement.   Patient Profile/HPI:  54 y.o. female  with past medical history of cirrhosis secondary to [place], seizures, DM, CKD, arthritis, anxiety, and chronic leukopenia, and ILD with recurrent hospitalizations for respiratory failure who was admitted on 08/29/2019 with an exacerbation of her chronic lung disease. She went to the ICU but after pulmonary evaluation and discussion opted to be DNR.  She was unable to tolerate 40L of HFNC and is currently on a NRB mask  with 20L.     Length of Stay: 2   Vital Signs: BP 94/60 (BP Location: Left Arm)   Pulse 83   Temp 98.1 F (36.7 C) (Axillary)   Resp 20   Ht _0  (1.676 m)   Wt 111.1 kg   LMP  (LMP Unknown)   SpO2 (!) 84%   BMI 39.54 kg/m  SpO2: SpO2: (!) 84 % O2 Device: O2 Device: NRB O2 Flow Rate: O2 Flow Rate (L/min): 15 L/min  Palliative Assessment/Data: 20%     Palliative Care Plan    Recommendations/Plan:   Will schedule very low dose morphine (1 mg q 4) to provide some level of dyspnea relief without harm.   She is currently on tussionex q 6 per primary - while this is a heavy dose it is absolutely necessary given Holly Mueller's symptoms.   Agree with Lasix 40 mg IV q 8 to improve breathing as well   PMT will continue to follow and stay in touch with family.     Recommend close collaboration with primary team, PMT  and pulmonology to coordinate accurate consistent messaging to the family in this potentially very stressful/confusing situation.   Please continue to allow family to visit.   Planning ahead I will ask TOC to obtain an Oxymizer for use in the hospital.  If she does not improve perhaps we can reduce her oxygen requirements enough to allow her to pass at a [facility].    Code Status:  DNR  Prognosis:   Very poor immediate and long term prognosis.  Discharge Planning:  To Be Determined  Care plan was discussed with attending team.  Will contact family today.  Thank you for allowing the Palliative Medicine Team to assist in the care of this patient.  Total time spent:  35 min.     Greater than 50%  of this time was spent counseling and coordinating care related to the above assessment and plan.  Holly Jenny, PA-C Palliative Medicine  Please contact Palliative MedicineTeam phone at [phone] for questions and concerns between 7 am - 7 pm.   Please see AMION for individual provider pager numbers.

## 2019-09-03 NOTE — Progress Notes (Signed)
[place] for Heparin Indication: DVT, r/o PE  Allergies  Allergen Reactions  . Aspirin Nausea Only and Other (See Comments)    Upsets the stomach    Patient Measurements: Height: _0  (167.6 cm) Weight: 111.1 kg (245 lb) IBW/kg (Calculated) : 59.3 Heparin Dosing Weight: 85.2 kg  Vital Signs: Temp: 98.5 F (36.9 C) (04/24 2038) Temp Source: Axillary (04/24 2038) BP: 131/85 (04/24 2038) Pulse Rate: 102 (04/24 2038)  Labs: Recent Labs    08/14/2019 2130 08/24/2019 2130 08/16/2019 2319 09/01/19 0338 09/01/19 0338 09/01/19 1231 09/02/19 0825 09/02/19 1556 09/02/19 2238  HGB 7.7*   < >  --  7.7*   < > 8.2* 9.3*  --   --   HCT 26.3*   < >  --  26.0*  --  24.0* 32.3*  --   --   PLT 60*  --   --  54*  --   --  90*  --   --   HEPARINUNFRC  --   --   --   --   --   --   --   --  0.17*  CREATININE 0.64  --   --  0.75  --   --  0.66  --   --   TROPONINIHS 4  --  5  --   --   --   --  5  --    < > = values in this interval not displayed.    Estimated Creatinine Clearance: 101.5 mL/min (by C-G formula based on SCr of 0.66 mg/dL).   Medical History: Past Medical History:  Diagnosis Date  . Acute respiratory failure with hypoxia ([place]) 09/22/2018  . Anxiety    "anxiety attacks- sometimes"  . Arthritis    bursitis left hip flares-not an issue  . Chronic kidney disease   . COPD (chronic obstructive pulmonary disease) ([place])   . Diabetes mellitus without complication ([place])    Type II  . Dysrhythmia   . Edentulous    10-19-13 at present  . Gastric varices with bleeding   . GERD (gastroesophageal reflux disease) 2008  . 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 Headache(784.0)    hx of migraines   . Heart murmur   . 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 ([place])   . History of blood transfusion    gi bleed  . History of kidney stones    passed  . Hypothyroidism 2008  .  Interstitial lung disease ([place])   . Leukopenia   . Lower esophageal ring 08/18/2013  . Primary biliary cholangitis ([place]) 10/24/2018  . Seasonal allergies 2003  . SEIZURES, HX OF 12/12/2009   Annotation: last seizure in early 1970s Qualifier: Diagnosis of  By: Carlena Sax  MD, Colletta [place]    . Wears glasses      Assessment: 54 yo F with new LLE DVT shown on doppler. Pharmacy asked to dose heparin. Patient has a history of chronic pancytopenia with baseline PLTs ~50-60s. Per discussion with family medicine team, will start IV heparin but target lower end of therapeutic goal and monitor CBC and s/sx bleeding closely. Hgb low 9.3 but improved from 7.7 yesterday. Plts also low but improved at 90 from 54 yesterday.  4/25 AM update:  Heparin level below goal  Goal of Therapy:  Heparin level 0.3-0.5 units/ml - no bolus given pancytopenia Monitor platelets by anticoagulation protocol:  Yes   Plan:  Inc heparin to 1450 units/hr 0900 heparin level Monitor daily heparin level, CBC, s/sx bleeding  Narda Bonds, PharmD, BCPS Clinical Pharmacist Phone: [phone]

## 2019-09-03 NOTE — Progress Notes (Signed)
[facility] for Heparin Indication: DVT, r/o PE  Allergies  Allergen Reactions  . Aspirin Nausea Only and Other (See Comments)    Upsets the stomach    Patient Measurements: Height: _0  (167.6 cm) Weight: 111.1 kg (245 lb) IBW/kg (Calculated) : 59.3 Heparin Dosing Weight: 85.2 kg  Vital Signs: Temp: 98 F (36.7 C) (04/25 0900) Temp Source: Axillary (04/25 0900) BP: 121/64 (04/25 0900) Pulse Rate: 95 (04/25 0900)  Labs: Recent Labs    08/14/2019 2130 08/16/2019 2130 08/30/2019 2319 09/01/19 0338 09/01/19 0338 09/01/19 1231 09/02/19 0825 09/02/19 1556 09/02/19 2238 09/03/19 0310 09/03/19 0806  HGB 7.7*   < >  --  7.7*   < > 8.2* 9.3*  --   --   --   --   HCT 26.3*   < >  --  26.0*  --  24.0* 32.3*  --   --   --   --   PLT 60*  --   --  54*  --   --  90*  --   --   --   --   HEPARINUNFRC  --   --   --   --   --   --   --   --  0.17*  --  0.23*  CREATININE 0.64   < >  --  0.75  --   --  0.66  --   --  0.77  --   TROPONINIHS 4  --  5  --   --   --   --  5  --   --   --    < > = values in this interval not displayed.    Estimated Creatinine Clearance: 101.5 mL/min (by C-G formula based on SCr of 0.77 mg/dL).   Medical History: Past Medical History:  Diagnosis Date  . Acute respiratory failure with hypoxia ([place]) 09/22/2018  . Anxiety    "anxiety attacks- sometimes"  . Arthritis    bursitis left hip flares-not an issue  . Chronic kidney disease   . COPD (chronic obstructive pulmonary disease) ([place])   . Diabetes mellitus without complication ([place])    Type II  . Dysrhythmia   . Edentulous    10-19-13 at present  . Gastric varices with bleeding   . GERD (gastroesophageal reflux disease) 2008  . 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 Headache(784.0)    hx of migraines   . Heart murmur   . 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 ([place])   . History of blood  transfusion    gi bleed  . History of kidney stones    passed  . Hypothyroidism 2008  . Interstitial lung disease ([place])   . Leukopenia   . Lower esophageal ring 08/18/2013  . Primary biliary cholangitis ([place]) 10/24/2018  . Seasonal allergies 2003  . SEIZURES, HX OF 12/12/2009   Annotation: last seizure in early 1970s Qualifier: Diagnosis of  By: Carlena Sax  MD, Colletta [place]    . Wears glasses      Assessment: 54 yo F with new LLE DVT shown on doppler. Pharmacy asked to dose heparin. Patient has a history of chronic pancytopenia with baseline PLTs ~50-60s. Per discussion with family medicine team, will start IV heparin but target lower end of therapeutic goal and monitor CBC and s/sx bleeding closely.   Heparin level remains subtherapeutic after increasing heparin  drip rate to 1450 units/hr. Hgb down to 8.5 from 9.3 yesterday. Plts also down from 90 to 76 today. No overt bleeding or infusion issues per RN.   Goal of Therapy:  Heparin level 0.3-0.5 units/ml - no bolus given pancytopenia Monitor platelets by anticoagulation protocol: Yes   Plan:  Increase heparin to 1550 units/hr Check 6-hr HL Monitor daily HL, CBC, and s/sx bleeding  Richardine Service, PharmD PGY1 Pharmacy Resident Phone: [phone] 09/03/2019  10:41 AM  Please check [email or web address] for unit-specific pharmacy phone numbers.

## 2019-09-03 NOTE — Progress Notes (Signed)
NAME:  Holly Mueller, MRN:  [id number], DOB:  08/21/1965, LOS: 2 ADMISSION DATE:  09/08/2019, CONSULTATION DATE:  4/3 REFERRING MD:  Dr. Nori Riis / FPTS, CHIEF COMPLAINT:  Dyspnea    Brief History    54 y/o F who presented to [facility] on 4/22 with worsening shortness of breath.    She is followed by Dr. Chase Caller for IPAF ( a type of ILD), hx of asthma, restrictive lung disease and chronic respiratory failure.  She has known chronic cough, GERD, biliary cholangitis, hepatic cirrhosis, pancytopenia.  Her radiographic images support are consistent with UIP.  She is on 3-4L O2 at baseline.  The patient was treated for bacterial sinusitis on 4/8 with Augmentin which she completed.  She reports she has had progressive shortness of breath over the past two weeks.  She saw Derl Barrow, NP on 4/19 for an acute visit for worsening shortness of breath and low O2 levels with exertion.  O2 was increased to 3-4L at that time and she was given a prednisone taper.  She notes she has had intermittent diarrhea in the last few days and lower extremity swelling.  The evening prior to presentation, she ambulated to the bathroom and became so short of breath she was unable to recover and called 911. She was recently prescribed Esbriet at reduced dose due to her underlying liver disease but has not started it at this time.  In the ER, the patient reported shortness of breath.  She was treated with IV steroids, magnesium, albuterol and nebulized bronchodilators. She required NRB during episodes of exertion in the ER.  However, at rest she was on 4-5L O2. CXR showed mixed bilateral interstitial and mutli-focal patchy consolidative opacities. She reports she has had elevated glucose due to prednisone use.   PCCM consulted for evaluation.   Past Medical History  IPAF / ILD -baseline 3-4L, positive ANA, SSA.  Primary Biliary Cholangitis  Hepatic Cirrhosis secondary to Primary Bilary Cholangitis  Gastric Varices  GERD DM  CKD    Epilepsy  Pancytopenia   Significant Hospital Events   4/22 Admit  4/23 PCCM consulted  - Pt reports she knows "it will take a while to feel better"  RN reports pt desaturated to mid 70's with exertion / turning in the bed  4/24 -this morning in 6n14. All night restless with cough. Needing NRB . Cough severe with desaturations during cough. At time of my arrival 97% on NRB and mildly labored.   RN t hingks patient cannot get CT because of inability to lie flat.  Did not take tussionex because In Jan 2021. D-dimer 0.86  (baseline for her and PE ruled out march 2021). Wheezing and crackles  Consults:  PCCM   Procedures:    Significant Diagnostic Tests:   CXR 4/23 >> bilateral mixed interstitial and alveolar infiltrates   Micro Data:  COVID 4/22 >> negative  Influenza A/B 4/22 >> negative  RVP negative  Antimicrobials:  Rocephin 4/22 >> 4/23 Azithro 4/22 >> 4/24      Interim history/subjective:  09/03/2019 - curently in SDU . On face mask 15L -> pulkse ox 80%. Worse with cough Now DNR with concurrent pallaition. On Rx for DVT as well.  Trop normal Lact 2.4 yesterday 4/24 echo without significant RV strain  Off abx On lasix On prn moiprhine and benzo On solumedrol  She does not think she can lie flat for CT   Objective   Blood pressure 105/61, pulse 85, temperature 98.6 F (  37 C), temperature source Axillary, resp. rate (!) 23, height _0  (1.676 m), weight 111.1 kg, SpO2 (!) 80 %.        Intake/Output Summary (Last 24 hours) at 09/03/2019 1739 Last data filed at 09/03/2019 0900 Gross per 24 hour  Intake 456.34 ml  Output 1800 ml  Net -1343.66 ml   Filed Weights   08/14/2019 2106  Weight: 111.1 kg    Examination: General Appearance:  Overweight, family sons by her side and brother Head:  Normocephalic, without obvious abnormality, atraumatic Eyes:  PERRL - yes, conjunctiva/corneas - ? jaundice     Ears:  Normal external ear canals, both ears Nose:  G  tube - no but face mask o2 Throat:  ETT TUBE - n , OG tube - no Neck:  Supple,  No enlargement/tenderness/nodules Lungs: Clear to auscultation bilaterally,  Heart:  S1 and S2 normal, no murmur, CVP - x.  Pressors - no Abdomen:  Soft, no masses, no organomegaly Genitalia / Rectal:  Not done Extremities:  Extremities- intact Skin:  intact in exposed areas .SPider nevii on chest Neurologic:  Sedation - prn opioids and benso -> RASS - +1 . Moves all 4s - yes. CAM-ICU - neg . Orientation - x3  Overall looks tired Pulse ox 80% with [facility] Problem list      Assessment & Plan:  Hx of asthma Idiopathic Pulmonary Autoimmune Fibrosis - IPAF Acute on Chronic Hypoxic Respiratory Failure Significant symptom burden UIP on radiographic appearance, ANA, SSA positive.  Baseline 3-4L O2.  Negative COVID, influenza.  PCt neative  09/03/2019 -severe acute on chronic hypoxemic respiratory failure associated with left lower extremity DVT.  Most likely diagnosis is ILD flareup followed by acute on chronic diastolic dysfunction.  Doubt PE even though there is a DVT because of benign surrogate markers.  High-level symptom burden of shortness of breath and cough  Plan  - c BiPAP as needed but no intubation  -  cough suppression  - steroids -  nebs  - lasix -Okay for no antibiotics    Get HRCT when more stable if possibl;e -  NO CPR No DEFIB. NO ACELS. NO INTUBATIION  Might not survive this admssion  Patient updated  Best practice:  Per FPTS and palliative care  ATTESTATION & SIGNATURE       Dr. Brand Males, M.D., [facility].C.P Pulmonary and Critical Care Medicine Staff Physician [place] [facility] Pager: [phone], If no answer or between  15:00h - 7:00h: call [phone]  09/03/2019 5:39 PM    LABS    PULMONARY Recent Labs  Lab 09/01/19 1231  PHART 7.421  PCO2ART 31.7*  PO2ART 64*  HCO3 20.6  TCO2 22    O2SAT 93.0    CBC Recent Labs  Lab 09/01/19 0338 09/01/19 0338 09/01/19 1231 09/02/19 0825 09/03/19 0806  HGB 7.7*   < > 8.2* 9.3* 8.5*  HCT 26.0*   < > 24.0* 32.3* 28.5*  WBC 1.4*  --   --  4.1 3.0*  PLT 54*  --   --  90* 76*   < > = values in this interval not displayed.    COAGULATION No results for input(s): INR in the last 168 hours.  CARDIAC  No results for input(s): TROPONINI in the last 168 hours. No results for input(s): PROBNP in the last 168 hours.   CHEMISTRY Recent  Labs  Lab 08/27/2019 2130 09/01/2019 2130 09/01/19 0338 09/01/19 0338 09/01/19 1231 09/01/19 1231 09/02/19 0825 09/03/19 0310  NA 133*  --  136  --  138  --  137 135  K 3.9   < > 4.2   < > 4.6   < > 3.7 4.4  CL 106  --  108  --   --   --  105 102  CO2 20*  --  18*  --   --   --  22 24  GLUCOSE 262*  --  372*  --   --   --  142* 247*  BUN 11  --  10  --   --   --  13 17  CREATININE 0.64  --  0.75  --   --   --  0.66 0.77  CALCIUM 8.1*  --  8.1*  --   --   --  8.7* 8.4*  MG  --   --   --   --   --   --  1.8 2.1  PHOS  --   --   --   --   --   --  3.1 4.5   < > = values in this interval not displayed.   Estimated Creatinine Clearance: 101.5 mL/min (by C-G formula based on SCr of 0.77 mg/dL).   LIVER Recent Labs  Lab 09/01/19 0338 09/02/19 0825  AST 33 32  ALT 23 24  ALKPHOS 122 99  BILITOT 1.1 2.2*  PROT 6.6 7.8  ALBUMIN 2.4* 2.8*     INFECTIOUS Recent Labs  Lab 09/01/19 0022 09/01/19 0338 09/02/19 0825 09/02/19 1556  LATICACIDVEN  --   --   --  2.3*  PROCALCITON <0.10 <0.10 <0.10  --      ENDOCRINE CBG (last 3)  Recent Labs    09/03/19 0644 09/03/19 1158 09/03/19 1648  GLUCAP 227* 214* 245*         IMAGING x48h  - image(s) personally visualized  -   highlighted in bold DG CHEST PORT 1 VIEW  Result Date: 09/02/2019 CLINICAL DATA:  Respiratory distress EXAM: PORTABLE CHEST 1 VIEW COMPARISON:  08/26/2019 FINDINGS: Lung volumes are diminished compared to  the prior study. Cardiomediastinal contours are obscured by increasing airspace disease and diaphragmatic contours in the setting of diminished lung volumes. Increased interstitial opacities are seen bilaterally as well. No gross pleural effusion. Visualized skeletal structures are unremarkable. IMPRESSION: Marked worsening of interstitial and airspace opacities. Suspicious for worsening infection. Correlate with any signs of heart failure. Electronically Signed   By: Zetta Bills M.D.   On: 09/02/2019 08:55   ECHOCARDIOGRAM COMPLETE  Result Date: 09/02/2019    ECHOCARDIOGRAM REPORT   Patient Name:   EVER HALBERG Date of Exam: 09/02/2019 Medical Rec #:  [id number]           Height:       66.0 in Accession #:    [id number]          Weight:       245.0 lb Date of Birth:  1965/12/08          BSA:          2.180 m Patient Age:    79 years            BP:           114/65 mmHg Patient Gender: F  HR:           109 bpm. Exam Location:  Inpatient Procedure: 2D Echo, Cardiac Doppler and Color Doppler Indications:    Acute Respiratory Insufficiency 518.82/R06.88  History:        Patient has prior history of Echocardiogram examinations, most                 recent 09/24/2018.  Sonographer:    Merrie Roof RDCS Referring Phys: [place]  1. Left ventricular ejection fraction, by estimation, is 60 to 65%. The left ventricle has normal function. The left ventricle has no regional wall motion abnormalities. Left ventricular diastolic parameters are consistent with Grade I diastolic dysfunction (impaired relaxation).  2. Right ventricular systolic function is normal. The right ventricular size is mildly enlarged.  3. Left atrial size was severely dilated.  4. The mitral valve is normal in structure. Trivial mitral valve regurgitation. No evidence of mitral stenosis.  5. The aortic valve is tricuspid. Aortic valve regurgitation is not visualized. Moderate aortic valve stenosis.  Aortic valve area, by VTI measures 1.51 cm. Aortic valve mean gradient measures 19.5 mmHg. Aortic valve Vmax measures 3.07 m/s.  6. The inferior vena cava is normal in size with greater than 50% respiratory variability, suggesting right atrial pressure of 3 mmHg. FINDINGS  Left Ventricle: Left ventricular ejection fraction, by estimation, is 60 to 65%. The left ventricle has normal function. The left ventricle has no regional wall motion abnormalities. The left ventricular internal cavity size was normal in size. There is  no left ventricular hypertrophy. Left ventricular diastolic parameters are consistent with Grade I diastolic dysfunction (impaired relaxation). Right Ventricle: The right ventricular size is mildly enlarged. No increase in right ventricular wall thickness. Right ventricular systolic function is normal. Left Atrium: Left atrial size was severely dilated. Right Atrium: Right atrial size was normal in size. Pericardium: There is no evidence of pericardial effusion. Mitral Valve: The mitral valve is normal in structure. Normal mobility of the mitral valve leaflets. Trivial mitral valve regurgitation. No evidence of mitral valve stenosis. Tricuspid Valve: The tricuspid valve is normal in structure. Tricuspid valve regurgitation is not demonstrated. No evidence of tricuspid stenosis. Aortic Valve: The aortic valve is tricuspid. . There is moderate thickening and moderate calcification of the aortic valve. Aortic valve regurgitation is not visualized. Moderate aortic stenosis is present. There is moderate thickening of the aortic valve. There is moderate calcification of the aortic valve. Aortic valve mean gradient measures 19.5 mmHg. Aortic valve peak gradient measures 37.7 mmHg. Aortic valve area, by VTI measures 1.51 cm. Pulmonic Valve: The pulmonic valve was normal in structure. Pulmonic valve regurgitation is not visualized. No evidence of pulmonic stenosis. Aorta: The aortic root is normal in  size and structure. Venous: The inferior vena cava is normal in size with greater than 50% respiratory variability, suggesting right atrial pressure of 3 mmHg. IAS/Shunts: No atrial level shunt detected by color flow Doppler.  LEFT VENTRICLE PLAX 2D LVIDd:         4.70 cm      Diastology LVIDs:         3.10 cm      LV e' lateral:   18.20 cm/s LV PW:         1.00 cm      LV E/e' lateral: 5.5 LV IVS:        0.90 cm      LV e' medial:    11.30 cm/s LVOT diam:  2.00 cm      LV E/e' medial:  8.8 LV SV:         85 LV SV Index:   39 LVOT Area:     3.14 cm  LV Volumes (MOD) LV vol d, MOD A2C: 119.0 ml LV vol d, MOD A4C: 110.0 ml LV vol s, MOD A2C: 44.1 ml LV vol s, MOD A4C: 48.4 ml LV SV MOD A2C:     74.9 ml LV SV MOD A4C:     110.0 ml LV SV MOD BP:      68.6 ml RIGHT VENTRICLE RV Basal diam:  4.50 cm RV Mid diam:    4.60 cm LEFT ATRIUM              Index       RIGHT ATRIUM           Index LA diam:        2.80 cm  1.28 cm/m  RA Area:     15.90 cm LA Vol (A2C):   176.0 ml 80.74 ml/m RA Volume:   38.80 ml  17.80 ml/m LA Vol (A4C):   122.0 ml 55.96 ml/m LA Biplane Vol: 151.0 ml 69.27 ml/m  AORTIC VALVE AV Area (Vmax):    1.58 cm AV Area (Vmean):   1.52 cm AV Area (VTI):     1.51 cm AV Vmax:           307.00 cm/s AV Vmean:          208.500 cm/s AV VTI:            0.560 m AV Peak Grad:      37.7 mmHg AV Mean Grad:      19.5 mmHg LVOT Vmax:         154.00 cm/s LVOT Vmean:        101.000 cm/s LVOT VTI:          0.269 m LVOT/AV VTI ratio: 0.48  AORTA Ao Root diam: 2.70 cm Ao Asc diam:  3.20 cm MITRAL VALVE MV Area (PHT): 5.97 cm     SHUNTS MV Decel Time: 127 msec     Systemic VTI:  0.27 m MV E velocity: 100.00 cm/s  Systemic Diam: 2.00 cm MV A velocity: 142.00 cm/s MV E/A ratio:  0.70 Candee Furbish MD Electronically signed by Candee Furbish MD Signature Date/Time: 09/02/2019/3:18:22 PM    Final    VAS [place] LOWER EXTREMITY VENOUS (DVT)  Result Date: 09/03/2019  Lower Venous DVTStudy Indications: Edema, and SOB.   Limitations: Poor ultrasound/tissue interface and patient sitting upright due to SOB. Comparison Study: 11/07/2018- negative lower extremity venous duplex Performing Technologist: Maudry Mayhew MHA, RDMS, RVT, RDCS  Examination Guidelines: A complete evaluation includes B-mode imaging, spectral Doppler, color Doppler, and power Doppler as needed of all accessible portions of each vessel. Bilateral testing is considered an integral part of a complete examination. Limited examinations for reoccurring indications may be performed as noted. The reflux portion of the exam is performed with the patient in reverse Trendelenburg.  +---------+---------------+---------+-----------+----------+--------------+ RIGHT    CompressibilityPhasicitySpontaneityPropertiesThrombus Aging +---------+---------------+---------+-----------+----------+--------------+ FV Prox  Full                                                        +---------+---------------+---------+-----------+----------+--------------+ FV Mid   Full                                                        +---------+---------------+---------+-----------+----------+--------------+  FV DistalFull                                                        +---------+---------------+---------+-----------+----------+--------------+ PFV      Full                                                        +---------+---------------+---------+-----------+----------+--------------+ POP      Full           Yes      Yes                                 +---------+---------------+---------+-----------+----------+--------------+ PTV      Full                                                        +---------+---------------+---------+-----------+----------+--------------+ PERO     Full                                                        +---------+---------------+---------+-----------+----------+--------------+   Right Technical  Findings: Not visualized segments include CFV, SFJ.  +---------+---------------+---------+-----------+----------+--------------+ LEFT     CompressibilityPhasicitySpontaneityPropertiesThrombus Aging +---------+---------------+---------+-----------+----------+--------------+ FV Prox  Full                    Yes                                 +---------+---------------+---------+-----------+----------+--------------+ FV Mid   Full                                                        +---------+---------------+---------+-----------+----------+--------------+ FV DistalFull                                                        +---------+---------------+---------+-----------+----------+--------------+ PFV      Full                                                        +---------+---------------+---------+-----------+----------+--------------+ POP      Full           Yes      Yes                                 +---------+---------------+---------+-----------+----------+--------------+  PTV      Full                    Yes                                 +---------+---------------+---------+-----------+----------+--------------+ PERO     None                    No                   Acute          +---------+---------------+---------+-----------+----------+--------------+   Left Technical Findings: Not visualized segments include CFV, SFJ.   Summary: RIGHT: - There is no evidence of deep vein thrombosis in the lower extremity. However, portions of this examination were limited- see technologist comments above.  - No cystic structure found in the popliteal fossa.  LEFT: - Findings consistent with acute deep vein thrombosis involving the left peroneal veins. - No cystic structure found in the popliteal fossa.  *See table(s) above for measurements and observations. Electronically signed by Harold Barban MD on 09/03/2019 at 11:41:31 AM.    Final    US Abdomen Limited  RUQ  Result Date: 09/02/2019 CLINICAL DATA:  54 year old female with cirrhosis. EXAM: ULTRASOUND ABDOMEN LIMITED RIGHT UPPER QUADRANT COMPARISON:  05/10/2019 ultrasound. FINDINGS: Gallbladder: The gallbladder is unremarkable. No cholelithiasis or evidence of acute cholecystitis. Common bile duct: Not well visualized due to body habitus and bowel gas. No definite biliary dilatation. Liver: Difficult to visualized. A nodular contour is noted compatible with cirrhosis. No gross focal hepatic abnormalities are present. Portal vein is patent on color Doppler imaging with normal direction of blood flow towards the liver. Other: None. IMPRESSION: Limited evaluation due to body habitus and bowel gas. Cirrhosis without definite focal hepatic abnormality. Unremarkable gallbladder. Electronically Signed   By: Margarette Canada M.D.   On: 09/02/2019 17:42

## 2019-09-03 NOTE — TOC Initial Note (Signed)
Transition of Care [facility]) - Initial/Assessment Note    Patient Details  Name: Holly Mueller MRN: [id number] Date of Birth: 11/22/1965  Transition of Care [facility]) CM/SW Contact:    Verdell Carmine, RN Phone Number: 09/03/2019, 4:04 PM  Clinical Narrative:                   Expected Discharge Plan: [place] Services Barriers to Discharge: Continued Medical Work up   Patient Goals and CMS Choice  Patient admitted with leukopenia interstitial lung  disease. On home o2 via [employer]. Lives with siblings. Sister works 3[employer]11, Brother works night shift. Patient anticipates her needs as nursing aid, nurse for vitals, PT and OT for reconditioning. Patient does not want to reside in a Nursing facility. Patient does not remember, or brushes off  speaking to palliative care.  Does have two sons, who have come to visit during our meeting.  Patient wants care later in the day, when she has no one at home 6p-10 pm. [facility] about [facility] services, will check on them.  Continue to follow patient with palliative care. [employer] is bringing [place] a oxymizer,to try appreciate their assistance.       Expected Discharge Plan and Services Expected Discharge Plan: [place] In-house Referral: Hospice / [place] arrangements for the past 2 months: Single Family Home                   DME Agency: [employer]                  Prior Living Arrangements/Services Living arrangements for the past 2 months: Single Family Home Lives with:: Relatives Patient language and need for interpreter reviewed:: Yes Do you feel safe going back to the place where you live?: Yes      Need for Family Participation in Patient Care: Yes (Comment) Care giver support system in place?: Yes (comment) Current home services: DME Criminal Activity/Legal Involvement Pertinent to Current Situation/Hospitalization: No - Comment as needed  Activities of Daily Living      Permission  Sought/Granted                  Emotional Assessment Appearance:: Appears older than stated age Attitude/Demeanor/Rapport: Engaged Affect (typically observed): Pleasant Orientation: : Oriented to Self, Oriented to Place, Oriented to  Time, Oriented to Situation Alcohol / Substance Use: Tobacco Use Psych Involvement: No (comment)  Admission diagnosis:  COPD exacerbation ([place]) [J44.1] Community acquired pneumonia, unspecified laterality [J18.9] Patient Active Problem List   Diagnosis Date Noted  . Primary biliary cirrhosis ([place])   . Palliative care encounter   . Acute bacterial sinusitis 08/17/2019  . Goals of care, counseling/discussion   . Palliative care by specialist   . Advanced care planning/counseling discussion   . Tachycardia 07/06/2019  . COPD exacerbation ([place]) 06/27/2019  . Interstitial pulmonary fibrosis ([place]) 06/26/2019  . Acute on chronic respiratory failure with hypoxia ([place]) 06/26/2019  . It band syndrome, left 06/16/2019  . Rectal bleeding 06/16/2019  . On supplemental oxygen therapy   . Nodule of right lung   . Respiratory distress   . Community acquired pneumonia of left lower lobe of lung 05/22/2019  . Petechiae 05/01/2019  . Lower extremity edema 05/01/2019  . Irritant contact dermatitis due to detergent 03/31/2019  . Melena 03/31/2019  . Financial difficulties 03/31/2019  . Skin rash 03/17/2019  . Intertrigo 02/11/2019  . Dysuria 02/11/2019  .  Chronic respiratory failure with hypoxia ([place]) 01/11/2019  . Inadequate housing 12/05/2018  . Community acquired pneumonia 11/28/2018  . Primary biliary cholangitis ([place]) 10/24/2018  . Gastric varices with bleeding   . Anemia 09/22/2018  . Chronic leukopenia 08/28/2018  . Does not have health insurance 04/04/2018  . Interstitial lung disease ([place]) 03/23/2018  . Type 2 diabetes mellitus without complication, without long-term current use of insulin ([place])   . Cirrhosis ([place]) 11/17/2016  . GERD  (gastroesophageal reflux disease) 10/23/2016  . Chronic cough 12/09/2015  . Precordial chest pain 08/03/2015  . DOE (dyspnea on exertion) 08/17/2013  . Ganglion cyst of wrist 08/24/2012  . Right hip pain 08/23/2012  . Complex care coordination 02/14/2012  . Obesity 07/08/2010  . Shortness of breath 12/12/2009  . Depression 07/26/2008  . Hypothyroidism 10/12/2006  . Restrictive lung disease 07/30/2006   PCP:  Cleophas Dunker, DO Pharmacy:   [place], [place] - [address] [facility]. [address] [place] [address] Phone: [phone] Fax: [phone]     Social Determinants of Health (SDOH) Interventions    Readmission Risk Interventions Readmission Risk Prevention Plan 11/24/2018  Transportation Screening Complete  PCP or Specialist Appt within 3-5 Days Complete  HRI or [place] Complete  Social Work Consult for [place] Planning/Counseling Complete  Palliative Care Screening Not Applicable  Medication Review Press photographer) Complete  Some recent data might be hidden

## 2019-09-03 NOTE — Progress Notes (Signed)
Pt refused to use the Bipap. If anything changes her RN will call me.

## 2019-09-03 NOTE — Progress Notes (Signed)
[place] for Heparin Indication: DVT, r/o PE  Allergies  Allergen Reactions  . Aspirin Nausea Only and Other (See Comments)    Upsets the stomach    Patient Measurements: Height: _0  (167.6 cm) Weight: 111.1 kg (245 lb) IBW/kg (Calculated) : 59.3 Heparin Dosing Weight: 85.2 kg  Vital Signs: Temp: 98.6 F (37 C) (04/25 1530) Temp Source: Axillary (04/25 1530) BP: 105/61 (04/25 1530) Pulse Rate: 85 (04/25 1530)  Labs: Recent Labs    08/18/2019 2130 08/24/2019 2130 09/03/2019 2319 09/01/19 0338 09/01/19 0338 09/01/19 1231 09/01/19 1231 09/02/19 0825 09/02/19 1556 09/02/19 2238 09/03/19 0310 09/03/19 0806 09/03/19 1636  HGB 7.7*   < >  --  7.7*   < > 8.2*   < > 9.3*  --   --   --  8.5*  --   HCT 26.3*   < >  --  26.0*   < > 24.0*  --  32.3*  --   --   --  28.5*  --   PLT 60*   < >  --  54*  --   --   --  90*  --   --   --  76*  --   HEPARINUNFRC  --   --   --   --   --   --   --   --   --  0.17*  --  0.23* 0.20*  CREATININE 0.64   < >  --  0.75  --   --   --  0.66  --   --  0.77  --   --   TROPONINIHS 4  --  5  --   --   --   --   --  5  --   --   --   --    < > = values in this interval not displayed.    Estimated Creatinine Clearance: 101.5 mL/min (by C-G formula based on SCr of 0.77 mg/dL).   Medical History: Past Medical History:  Diagnosis Date  . Acute respiratory failure with hypoxia ([place]) 09/22/2018  . Anxiety    "anxiety attacks- sometimes"  . Arthritis    bursitis left hip flares-not an issue  . Chronic kidney disease   . COPD (chronic obstructive pulmonary disease) ([place])   . Diabetes mellitus without complication ([place])    Type II  . Dysrhythmia   . Edentulous    10-19-13 at present  . Gastric varices with bleeding   . GERD (gastroesophageal reflux disease) 2008  . 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 Headache(784.0)    hx of migraines   . Heart  murmur   . 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 History of blood transfusion    gi bleed  . History of kidney stones    passed  . Hypothyroidism 2008  . Interstitial lung disease ([place])   . Leukopenia   . Lower esophageal ring 08/18/2013  . Primary biliary cholangitis ([place]) 10/24/2018  . Seasonal allergies 2003  . SEIZURES, HX OF 12/12/2009   Annotation: last seizure in early 1970s Qualifier: Diagnosis of  By: Carlena Sax  MD, Colletta Maryland    . Wears glasses      Assessment: 54 yo F with new LLE DVT shown on doppler. Pharmacy asked to dose heparin. Patient has a history of chronic pancytopenia with baseline  PLTs ~50-60s. Per discussion with family medicine team, will start IV heparin but target lower end of therapeutic goal and monitor CBC and s/sx bleeding closely.   Heparin level 0.2 remains subtherapeutic after increasing heparin drip rate to 1550 units/hr. Hgb down to 8.5 from 9.3 yesterday. Plts also down from 90 to 76 today. No overt bleeding or infusion issues per RN.  Will continue to increase slowly   Goal of Therapy:  Heparin level 0.3-0.5 units/ml - no bolus given pancytopenia Monitor platelets by anticoagulation protocol: Yes   Plan:  Increase heparin to 1650 units/hr Monitor daily HL, CBC, and s/sx bleeding  Bonnita Nasuti Pharm.D. CPP, BCPS Clinical Pharmacist [phone] 09/03/2019 6:36 PM    Please check [email or web address] for unit-specific pharmacy phone numbers.

## 2019-09-04 DIAGNOSIS — K7469 Other cirrhosis of liver: Secondary | ICD-10-CM

## 2019-09-04 DIAGNOSIS — J441 Chronic obstructive pulmonary disease with (acute) exacerbation: Secondary | ICD-10-CM | POA: Diagnosis not present

## 2019-09-04 DIAGNOSIS — D72819 Decreased white blood cell count, unspecified: Secondary | ICD-10-CM | POA: Diagnosis not present

## 2019-09-04 DIAGNOSIS — R0603 Acute respiratory distress: Secondary | ICD-10-CM | POA: Diagnosis not present

## 2019-09-04 DIAGNOSIS — J9621 Acute and chronic respiratory failure with hypoxia: Secondary | ICD-10-CM | POA: Diagnosis not present

## 2019-09-04 DIAGNOSIS — Z515 Encounter for palliative care: Secondary | ICD-10-CM

## 2019-09-04 DIAGNOSIS — F411 Generalized anxiety disorder: Secondary | ICD-10-CM

## 2019-09-04 DIAGNOSIS — J849 Interstitial pulmonary disease, unspecified: Secondary | ICD-10-CM | POA: Diagnosis not present

## 2019-09-04 DIAGNOSIS — R0600 Dyspnea, unspecified: Secondary | ICD-10-CM

## 2019-09-04 LAB — BASIC METABOLIC PANEL
Anion gap: 9 (ref 5–15)
BUN: 29 mg/dL — ABNORMAL HIGH (ref 6–20)
CO2: 29 mmol/L (ref 22–32)
Calcium: 8.7 mg/dL — ABNORMAL LOW (ref 8.9–10.3)
Chloride: 101 mmol/L (ref 98–111)
Creatinine, Ser: 0.8 mg/dL (ref 0.44–1.00)
GFR calc Af Amer: >60 mL/min (ref >60)
GFR calc non Af Amer: >60 mL/min (ref >60)
Glucose, Bld: 204 mg/dL — ABNORMAL HIGH (ref 70–99)
Potassium: 4.2 mmol/L (ref 3.5–5.1)
Sodium: 139 mmol/L (ref 135–145)

## 2019-09-04 LAB — CBC
HCT: 35 % — ABNORMAL LOW (ref 36.0–46.0)
Hemoglobin: 10.1 g/dL — ABNORMAL LOW (ref 12.0–15.0)
MCH: 23.9 pg — ABNORMAL LOW (ref 26.0–34.0)
MCHC: 28.9 g/dL — ABNORMAL LOW (ref 30.0–36.0)
MCV: 82.9 fL (ref 80.0–100.0)
Platelets: 108 10*3/uL — ABNORMAL LOW (ref 150–400)
RBC: 4.22 MIL/uL (ref 3.87–5.11)
RDW: 15.9 % — ABNORMAL HIGH (ref 11.5–15.5)
WBC: 5 10*3/uL (ref 4.0–10.5)
nRBC: 0 % (ref 0.0–0.2)

## 2019-09-04 LAB — PHOSPHORUS: Phosphorus: 4.4 mg/dL (ref 2.5–4.6)

## 2019-09-04 LAB — HEPARIN LEVEL (UNFRACTIONATED): Heparin Unfractionated: 0.35 IU/mL (ref 0.30–0.70)

## 2019-09-04 LAB — GLUCOSE, CAPILLARY: Glucose-Capillary: 174 mg/dL — ABNORMAL HIGH (ref 70–99)

## 2019-09-04 LAB — MAGNESIUM: Magnesium: 2.2 mg/dL (ref 1.7–2.4)

## 2019-09-04 MED ORDER — METHYLPREDNISOLONE SODIUM SUCC 125 MG IJ SOLR
60.0000 mg | Freq: Three times a day (TID) | INTRAMUSCULAR | Status: DC
  Administered 2019-09-04 – 2019-09-05 (×2): 60 mg via INTRAVENOUS
  Filled 2019-09-04 (×2): qty 2

## 2019-09-04 MED ORDER — GLYCOPYRROLATE 0.2 MG/ML IJ SOLN: 0.2000 mg | INTRAMUSCULAR | Status: DC | PRN

## 2019-09-04 MED ORDER — MORPHINE SULFATE (PF) 2 MG/ML IV SOLN: 2.0000 mg | INTRAVENOUS | Status: DC | PRN

## 2019-09-04 MED ORDER — DEXTROSE 5 % IV SOLN: INTRAVENOUS | Status: DC

## 2019-09-04 MED ORDER — ACETAMINOPHEN 650 MG RE SUPP: 650.0000 mg | Freq: Four times a day (QID) | RECTAL | Status: DC | PRN

## 2019-09-04 MED ORDER — LORAZEPAM 2 MG/ML IJ SOLN
2.0000 mg | INTRAMUSCULAR | Status: DC | PRN
  Administered 2019-09-04 (×2): 2 mg via INTRAVENOUS
  Filled 2019-09-04 (×2): qty 1

## 2019-09-04 MED ORDER — GLYCOPYRROLATE 1 MG PO TABS
1.0000 mg | ORAL_TABLET | ORAL | Status: DC | PRN
  Filled 2019-09-04: qty 1

## 2019-09-04 MED ORDER — MORPHINE 100MG IN NS 100ML (1MG/ML) PREMIX INFUSION
0.0000 mg/h | INTRAVENOUS | Status: DC
  Administered 2019-09-04: 5 mg/h via INTRAVENOUS
  Administered 2019-09-04 – 2019-09-05 (×6): 15 mg/h via INTRAVENOUS
  Filled 2019-09-04 (×8): qty 100

## 2019-09-04 MED ORDER — ACETAMINOPHEN 325 MG PO TABS: 650.0000 mg | ORAL_TABLET | Freq: Four times a day (QID) | ORAL | Status: DC | PRN

## 2019-09-04 MED ORDER — METOPROLOL TARTRATE 5 MG/5ML IV SOLN
2.5000 mg | Freq: Once | INTRAVENOUS | Status: AC
  Administered 2019-09-04: 2.5 mg via INTRAVENOUS
  Filled 2019-09-04: qty 5

## 2019-09-04 MED ORDER — DIPHENHYDRAMINE HCL 50 MG/ML IJ SOLN: 25.0000 mg | INTRAMUSCULAR | Status: DC | PRN

## 2019-09-04 MED ORDER — MORPHINE BOLUS VIA INFUSION
5.0000 mg | INTRAVENOUS | Status: DC | PRN
  Administered 2019-09-04 – 2019-09-05 (×4): 5 mg via INTRAVENOUS
  Filled 2019-09-04: qty 5

## 2019-09-04 MED ORDER — HALOPERIDOL LACTATE 5 MG/ML IJ SOLN: 2.5000 mg | INTRAMUSCULAR | Status: DC | PRN

## 2019-09-04 MED ORDER — POLYVINYL ALCOHOL 1.4 % OP SOLN
1.0000 [drp] | Freq: Four times a day (QID) | OPHTHALMIC | Status: DC | PRN
  Filled 2019-09-04: qty 15

## 2019-09-04 MED FILL — ESBRIET 267 MG TABS: 267 | 28 days supply | Qty: 159 | Fill #0

## 2019-09-04 NOTE — Progress Notes (Signed)
FPTS Interim Progress Note  Spoke with RN regarding desats of 70's-80's which have been low all day. RN reports that patient is speaking in full sentences and is not in any distress. Pt was refusing BiPAP but has agreed to wearing it now thanks to nursing encouragement.  She is now resting comfortably.   Carollee Leitz MD

## 2019-09-04 NOTE — Progress Notes (Signed)
[place] for Heparin Indication: DVT, r/o PE  Allergies  Allergen Reactions  . Aspirin Nausea Only and Other (See Comments)    Upsets the stomach    Patient Measurements: Height: _0  (167.6 cm) Weight: 111.1 kg (245 lb) IBW/kg (Calculated) : 59.3 Heparin Dosing Weight: 85.2 kg  Vital Signs: Temp: 97.8 F (36.6 C) (04/26 0448) Temp Source: Axillary (04/26 0448) BP: 124/87 (04/26 0649) Pulse Rate: 165 (04/26 0649)  Labs: Recent Labs    09/01/19 1231 09/01/19 1231 09/02/19 0825 09/02/19 1556 09/02/19 2238 09/03/19 0310 09/03/19 0806 09/03/19 1636  HGB 8.2*   < > 9.3*  --   --   --  8.5*  --   HCT 24.0*  --  32.3*  --   --   --  28.5*  --   PLT  --   --  90*  --   --   --  76*  --   HEPARINUNFRC  --   --   --   --  0.17*  --  0.23* 0.20*  CREATININE  --   --  0.66  --   --  0.77  --   --   TROPONINIHS  --   --   --  5  --   --   --   --    < > = values in this interval not displayed.    Estimated Creatinine Clearance: 101.5 mL/min (by C-G formula based on SCr of 0.77 mg/dL).   Medical History: Past Medical History:  Diagnosis Date  . Acute respiratory failure with hypoxia ([place]) 09/22/2018  . Anxiety    "anxiety attacks- sometimes"  . Arthritis    bursitis left hip flares-not an issue  . Chronic kidney disease   . COPD (chronic obstructive pulmonary disease) ([place])   . Diabetes mellitus without complication ([place])    Type II  . Dysrhythmia   . Edentulous    10-19-13 at present  . Gastric varices with bleeding   . GERD (gastroesophageal reflux disease) 2008  . 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 Headache(784.0)    hx of migraines   . Heart murmur   . 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 History of blood transfusion    gi bleed  . History of kidney stones    passed  . Hypothyroidism 2008  . Interstitial lung disease ([place])   .  Leukopenia   . Lower esophageal ring 08/18/2013  . Primary biliary cholangitis ([place]) 10/24/2018  . Seasonal allergies 2003  . SEIZURES, HX OF 12/12/2009   Annotation: last seizure in early 1970s Qualifier: Diagnosis of  By: Carlena Sax  MD, Colletta [place]    . Wears glasses      Assessment: 54 yo F with new LLE DVT shown on doppler. Pharmacy asked to dose heparin. Patient has a history of chronic pancytopenia with baseline PLTs ~50-60s. Per discussion with family medicine team, will start IV heparin but target lower end of therapeutic goal and monitor CBC and s/sx bleeding closely.   ~11 hour heparin level this morning is therapeutic at 0.35 on 1650 units/hour. H&H increased from yesterday at 10.1/35, plts also increased now at 108. Labs drawn from L wrist, heparin infusing in R PIV. Per RN, no infusion issues reported over night. CTA ordered but not completed.   Goal of Therapy:  Heparin level 0.3-0.5 units/ml -  no bolus given pancytopenia Monitor platelets by anticoagulation protocol: Yes   Plan:  Continue heparin infusion at 1650 units/hr Monitor daily HL, CBC, and s/sx bleeding F/u PE work up    Thank you,   Eddie Candle, PharmD PGY-1 Pharmacy Resident   Please check amion for clinical pharmacist contact number

## 2019-09-04 NOTE — Progress Notes (Signed)
Patient removed from bipap at her request.  After being off for 10 minutes her oxygen saturations dropped below 70% and maintained at 50%.  I placed the patient back on her bipap at 100% and oxygen saturation returned to 85-90%. Patient denied symptoms with low oxygen saturations and was alert and able to converse and answer questions appropriately.  Nursing staff to continue to monitor

## 2019-09-04 NOTE — Progress Notes (Signed)
Patient heart rate tachycardic in 160's.  She is asymptomsatic and all other vitals are stable for her.  I notified Dr. Volanda Napoleon via phone and she came to the bedside to assess patient.  New orders were placed in [facility] and carried out.  Dr. Volanda Napoleon will placed further orders and contact oncoming RN for further updates.  Patient remains in bed alert and oriented x 4 and denies complaint.  Nursing staff to continue to monitor.

## 2019-09-04 NOTE — Progress Notes (Signed)
Family Medicine Teaching Service Daily Progress Note Intern Pager: [phone]  Patient name: Holly Mueller Medical record number: [id number] Date of birth: 04/15/1966 Age: 54 y.o. Gender: female  Primary Care Provider: Cleophas Dunker, DO Consultants: CCM Code Status: DNR  Pt Overview and Major Events to Date:  4/22 Admitted, COVID/FLU/RVP negative 4/23 CCM consulted, CXR: bilateral mixed interstitial and alveolar infiltrates  4/24: Abx discontinued, transitioned to DNR status, moved to progressive floor, placed on BIPAP, palliative consulted  Assessment and Plan: Holly Mueller is a 54 y.o. female presenting with SOB secondary to exacerbation of her chronic lung disease. 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  Acute on chronic hypoxic respiratory failure  Known ILD/Idiopathic Pulmonary Autoimmune Fibrosis (baseline O2 3-4L)  Patient has significant underlying lung disease with acute worsening. Now full comfort care.  -Continue BiPAP/NRB/O2 PRN -CCM consulted, appreciate recommendations - Tussionex q6 hours, Mucinex DM PRN -discontinue heparin gtt -Continue steroids - 63m IV q8 hours -Continue Duonebs q4 hours, cont Flonase, Incruse Ellipta QD, Claritin -Continue lasix: 461mIV q8 hours -Continue morphine per palliative -Ativan PRN  Acute left DVT Risk-benefit discussion with GI, opted to start heparin infusion without bolus. -plan per above  Diarrhea, acute: Resolved No BMs in last 24 hrs - continue to monitor  DM CBGs 174-203. -discontinue CBG monitoring with meals and at bedtime  Hepatic cirrhosis secondary to primary biliary cholangitis: chronic, stable Right upper quadrant ultrasound limited due to body habitus and bowel gas, cirrhosis without definite focal hepatic abnormality.  Home meds:  Ursodiol 30057mID  - continue home meds  Hypothyroidism: chronic, stable Home meds include Synthroid 250 MCG's daily. -Continue home meds  Chronic pancytopenia: improving Follows with heme/onc as outpatient for this chronic problem.   CBC not drawn this morning despite ordering.  Will ensure that this is collected this AM. -monitor for signs of infection, fever curve and worsening cough/respiratory status   GERD Continue home Protonix 70m42mEN/GI:  Regular diet Prophylaxis: Lovenox  Disposition: Full comfort care; prognosis is poor  Subjective:  Appreciative of her care. Is thirsty and hungry.    Objective: Temp:  [97.8 F (36.6 C)-98.9 F (37.2 C)] 97.8 F (36.6 C) (04/26 0448) Pulse Rate:  [79-165] 165 (04/26 0649) Resp:  [19-27] 20 (04/26 0649) BP: (105-130)/(61-87) 124/87 (04/26 0649) SpO2:  [71 %-86 %] 86 % (04/26 0649)  Physical Exam:   General: Appears unwell, mild acute distress. Age appropriate. Cardiac: unable to appreciate with BiPAP machine running Respiratory: same as above Extremities: No edema or cyanosis. Skin: Warm and dry, no rashes noted Neuro: alert and oriented, no focal deficits Psych: appears anxious  Laboratory: Recent Labs  Lab 09/01/19 0338 09/01/19 0338 09/01/19 1231 09/02/19 0825 09/03/19 0806  WBC 1.4*  --   --  4.1 3.0*  HGB 7.7*   < > 8.2* 9.3* 8.5*  HCT 26.0*   < > 24.0* 32.3* 28.5*  PLT 54*  --   --  90* 76*   < > = values in this interval not displayed.   Recent Labs  Lab 09/01/19 0338 09/01/19 0338 09/01/19 1231 09/02/19 0825 09/03/19 0310  NA 136   < > 138 137 135  K 4.2   < > 4.6 3.7 4.4  CL 108  --   --  105 102  CO2 18*  --   --  22 24  BUN 10  --   --  13 17  CREATININE 0.75  --   --  0.66 0.77  CALCIUM 8.1*  --   --  8.7* 8.4*  PROT 6.6  --   --  7.8  --   BILITOT 1.1  --   --  2.2*  --   ALKPHOS 122  --   --  99  --   ALT 23  --   --  24  --   AST 33  --   --  32  --   GLUCOSE 372*  --   --   142* 247*   < > = values in this interval not displayed.    Imaging/Diagnostic Tests: PORTABLE CHEST 1 VIEW COMPARISON:  08/15/2019 IMPRESSION: Marked worsening of interstitial and airspace opacities. Suspicious for worsening infection. Correlate with any signs of heart failure.  ULTRASOUND ABDOMEN LIMITED RIGHT UPPER QUADRANT COMPARISON:  05/10/2019 ultrasound IMPRESSION: Limited evaluation due to body habitus and bowel gas. Cirrhosis without definite focal hepatic abnormality. Unremarkable gallbladder.  ECHO IMPRESSIONS  1. Left ventricular ejection fraction, by estimation, is 60 to 65%. The  left ventricle has normal function. The left ventricle has no regional  wall motion abnormalities. Left ventricular diastolic parameters are  consistent with Grade I diastolic  dysfunction (impaired relaxation).  2. Right ventricular systolic function is normal. The right ventricular  size is mildly enlarged.  3. Left atrial size was severely dilated.  4. The mitral valve is normal in structure. Trivial mitral valve  regurgitation. No evidence of mitral stenosis.  5. The aortic valve is tricuspid. Aortic valve regurgitation is not  visualized. Moderate aortic valve stenosis. Aortic valve area, by VTI  measures 1.51 cm. Aortic valve mean gradient measures 19.5 mmHg. Aortic  valve Vmax measures 3.07 m/s.  6. The inferior vena cava is normal in size with greater than 50%  respiratory variability, suggesting right atrial pressure of 3 mmHg.    Gerlene Fee, DO 09/04/2019, 7:37 AM PGY-1, [place] Intern pager: [phone], text pages welcome

## 2019-09-04 NOTE — Progress Notes (Signed)
FPTS Interim Progress Note  S: Went to patient's bedside for comfort and to answer any questions family or the patient may have a this time. No questions a this time. Patient expressed gratitude in her care that [facility] has provided. Family is at bedside.   A/P: Full comfort care. We are appreciative of our patient for allowing [place] to continue to care for her. Palliative care is on board and we appreciate their help.   Gerlene Fee, DO 09/04/2019, 11:57 AM PGY-1, [place] Medicine Service pager [phone]

## 2019-09-04 NOTE — Progress Notes (Signed)
Daily Progress Note   Patient Name: Holly Mueller       Date: 09/04/2019 DOB: 04/10/1966  Age: 54 y.o. MRN#: [id number] Attending Physician: Dickie La, MD Primary Care Physician: Cleophas Dunker, DO Admit Date: 08/29/2019  Reason for Consultation/Follow-up: Establishing goals of care and Terminal Care    Subjective/GOC: Per chart review, clinical deterioration this AM with tachypnea, accessory muscle use, desaturation on BiPAP 70-80%. After patient discussion with PCCM, decision made for transition to comfort measures only. Morphine gtt started. Discussed with RN and Dr. Lavonia Drafts.   Visited with patient at bedside. [name] is awake, alert, oriented. She reports feeling slightly better after initiation of morphine infusion but does feel anxious. RN at bedside and about to give ativan. Oxygen sats mid 60's on NRB 15L.   Brother and sister at bedside. More family is on the way. Patient and family understand diagnoses and poor prognosis. Discussed comfort focused care plan and being prepared for 'anything to happen at any time.'  [name] points up and shares that only God knows. Her pastor plans to visit this afternoon. Emphasized focus on symptom management to ensure relief from suffering and comfort at EOL. Discussed comfort feeds. Answered questions and concerns. [name] is not afraid. PMT contact information given to patient and siblings.   Length of Stay: 3   Vital Signs: BP 109/67 (BP Location: Left Arm)   Pulse 84   Temp 97.8 F (36.6 C) (Axillary)   Resp 20   Ht _0  (1.676 m)   Wt 111.1 kg   LMP  (LMP Unknown)   SpO2 90%   BMI 39.54 kg/m  SpO2: SpO2: 90 % O2 Device: O2 Device: Bi-PAP O2 Flow Rate: O2 Flow Rate (L/min): 15 L/min       Palliative Assessment/Data:  20%     Palliative Care Plan   Patient Profile/HPI:  54 y.o. female  with past medical history of cirrhosis secondary to [place], seizures, DM, CKD, arthritis, anxiety, and chronic leukopenia, and ILD with recurrent hospitalizations for respiratory failure who was admitted on 08/14/2019 with an exacerbation of her chronic lung disease. She went to the ICU but after pulmonary evaluation and discussion opted to be DNR.  She was unable to tolerate 40L of HFNC and is currently on a NRB mask with 20L.  Assessment: Acute on chronic respiratory failure. Chronic interstitial lung disease Acute on chronic diastolic heart failure Pulmonary edema Hepatic cirrhosis secondary to primary biliary cholangitis Dyspnea Anxiety state  Recommendations/Plan:  Transitioned to comfort focused care plan this morning.  Morphine gtt initiated. Comfort meds on MAR.   Comfort feeds per patient/family request.  Chaplain as needed.  Unrestricted visitor access as patient is nearing the end of her life.   Code Status:  DNR  Prognosis:   Poor prognosis: days if not hours  Discharge Planning:  To Be Determined: anticipate hospital death  Care plan was discussed with Dr. Lavonia Drafts, RN, patient, sister/brother at bedside   Thank you for allowing the Palliative Medicine Team to assist in the care of this patient.  Total time spent:  25mn     Greater than 50% of this time was spent counseling and coordinating care related to the above assessment and plan.   MIhor Dow DNP, FNP-C Palliative Medicine Team  Phone: [phone]: [phone] Please contact Palliative MedicineTeam phone at [phone] questions and concerns between 7 am - 7 pm.   Please see AMION for individual provider pager numbers.

## 2019-09-04 NOTE — Progress Notes (Signed)
FPTS Interim Progress Note  S: Paged by RN with concern for HR 166 after coming of BiPAP for 20-30 mins and coughing episode on some water.  Went to assess patient.  Denies any chest pain, palpitations or worsening SOB.    O: BP 124/87   Pulse (!) 165   Temp 97.8 F (36.6 C) (Axillary)   Resp 20   Ht _0  (1.676 m)   Wt 111.1 kg   LMP  (LMP Unknown)   SpO2 (!) 86%   BMI 39.54 kg/m    General: Alert and oriented, no apparent distress  Cardiovascular: Tachycardic, regular rhythm HR 164-166 Respiratory: fine crackles appreciated   A/P: Sustained SVT -ECG -Metoprolol 2.5 mg IV x1 -Repeat ECG   Carollee Leitz, MD 09/04/2019, 7:10 AM PGY-1, [place] Medicine Service pager [phone]

## 2019-09-04 NOTE — Progress Notes (Signed)
Chaplain responded to Spiritual Consult for presence with family at end of life.  Chaplain found two sons at bedside.  Chaplain attempted to make a connection with the young men who were holding their mother's hands.  Neither son was able to speak many words. When the Chaplain spoke to them, they burst into tears.  One son was so overcome he had to leave the room.  The other son asked for a prayer for his mom.  Chaplain prayed and departed.  Nurse advised patient's family was in the conference room and asked Chaplain to stop by where there were 8 additional members of the family. Chaplain read scripture and had prayer with the family.  Chaplain will follow up with sons or family if needed.  De [place]rs Chaplain Resident

## 2019-09-04 NOTE — Progress Notes (Signed)
Paged Dr to make aware of change in patient heart rate. I gave her an additional 5 mg morphine bolus. Heart rate is elevated up into 160-180's sustained. Family at bedside and aware of heart rate change. Comfort care.

## 2019-09-04 NOTE — Progress Notes (Signed)
NAME:  Holly Mueller, MRN:  [id number], DOB:  [birth year], LOS: 3 ADMISSION DATE:  08/20/2019, CONSULTATION DATE:  4/3 REFERRING MD:  Dr. Nori Riis / FPTS, CHIEF COMPLAINT:  Dyspnea    Brief History    54 y/o F who presented to [facility] on 4/22 with worsening shortness of breath.    She is followed by Dr. Chase Caller for IPAF ( a type of ILD), hx of asthma, restrictive lung disease and chronic respiratory failure.  She has known chronic cough, GERD, biliary cholangitis, hepatic cirrhosis, pancytopenia.  Her radiographic images support are consistent with UIP.  She is on 3-4L O2 at baseline.  The patient was treated for bacterial sinusitis on 4/8 with Augmentin which she completed.  She reports she has had progressive shortness of breath over the past two weeks.  She saw Derl Barrow, NP on 4/19 for an acute visit for worsening shortness of breath and low O2 levels with exertion.  O2 was increased to 3-4L at that time and she was given a prednisone taper.  She notes she has had intermittent diarrhea in the last few days and lower extremity swelling.  The evening prior to presentation, she ambulated to the bathroom and became so short of breath she was unable to recover and called 911. She was recently prescribed Esbriet at reduced dose due to her underlying liver disease but has not started it at this time.  In the ER, the patient reported shortness of breath.  She was treated with IV steroids, magnesium, albuterol and nebulized bronchodilators. She required NRB during episodes of exertion in the ER.  However, at rest she was on 4-5L O2. CXR showed mixed bilateral interstitial and mutli-focal patchy consolidative opacities. She reports she has had elevated glucose due to prednisone use.   PCCM consulted for evaluation.   Past Medical History  IPAF / ILD -baseline 3-4L, positive ANA, SSA.  Primary Biliary Cholangitis  Hepatic Cirrhosis secondary to Primary Bilary Cholangitis  Gastric Varices  GERD DM  CKD    Epilepsy  Pancytopenia   Significant Hospital Events   4/22 Admit  4/23 PCCM consulted  - Pt reports she knows "it will take a while to feel better"  RN reports pt desaturated to mid 70's with exertion / turning in the bed  4/24 -this morning in 6n14. All night restless with cough. Needing NRB . Cough severe with desaturations during cough. At time of my arrival 97% on NRB and mildly labored.   RN t hingks patient cannot get CT because of inability to lie flat.  Did not take tussionex because In Jan 2021. D-dimer 0.86  (baseline for her and PE ruled out march 2021). Wheezing and crackles  Consults:  PCCM   Procedures:    Significant Diagnostic Tests:   CXR 4/23 >> bilateral mixed interstitial and alveolar infiltrates   Micro Data:  COVID 4/22 >> negative  Influenza A/B 4/22 >> negative  RVP negative  Antimicrobials:  Rocephin 4/22 >> 4/23 Azithro 4/22 >> 4/24      Interim history/subjective:  Worse over night    Objective   Blood pressure 109/67, pulse 84, temperature 97.8 F (36.6 C), temperature source Axillary, resp. rate 20, height _0  (1.676 m), weight 111.1 kg, SpO2 90 %.    FiO2 (%):  [100 %] 100 %   Intake/Output Summary (Last 24 hours) at 09/04/2019 0907 Last data filed at 09/03/2019 1800 Gross per 24 hour  Intake 277.82 ml  Output 900 ml  Net -  622.18 ml   Filed Weights   08/18/2019 2106  Weight: 111.1 kg    Examination:  General this is a acutely ill white female currently on NIPPV w/ limited reserves HENT NCAT no JVD BIPAP mask in place.  Pulm exp wheeze crackles bases  Card systolic murmur regular rhythm  abd not tender + bowel sounds Neuro intact  Ext warm and dry  GU cl yellow    Resolved Hospital Problem list      Assessment & Plan:  Hx of asthma Idiopathic Pulmonary Autoimmune Fibrosis - IPAF Acute on Chronic Hypoxic Respiratory Failure Significant symptom burden UIP on radiographic appearance, ANA, SSA positive.  Baseline  3-4L O2.  Negative COVID, influenza.  PCt neative SVT   Acute on chronic hypoxic respiratory failure most likely 2/2 ILD flare, complicated by acute on chronic diastolic HF w/ element of pulmonary edema. -worsening hypoxia. Now on NIPPV.  -she is neg 7.3 liters -DNR and DNI -nothing else to offer here-->already maximized therapy w/in scope of her requested limitations.  Plan Cont BIPAP as tolerated Wean oxygen  Day 3 solumedrol; start weaning this on 4/27- over 3-4 week course  Cont lasix as BP/BUN/ct allow Cont cough suppression Agree w/ PRN morphine. If she declines further you will need to transition to comfort.  If she gets better get HRCT    DVT-->doubt PE though given low DDimer  Plan Cont [facility]  We will be available as needed.   Erick Colace ACNP-BC [place] Pager # [phone] OR # [phone] if no answer

## 2019-09-05 DIAGNOSIS — R0603 Acute respiratory distress: Secondary | ICD-10-CM | POA: Diagnosis not present

## 2019-09-05 DIAGNOSIS — Z515 Encounter for palliative care: Secondary | ICD-10-CM | POA: Diagnosis not present

## 2019-09-05 DIAGNOSIS — J849 Interstitial pulmonary disease, unspecified: Secondary | ICD-10-CM | POA: Diagnosis not present

## 2019-09-05 DIAGNOSIS — J441 Chronic obstructive pulmonary disease with (acute) exacerbation: Secondary | ICD-10-CM | POA: Diagnosis not present

## 2019-09-05 DIAGNOSIS — J9621 Acute and chronic respiratory failure with hypoxia: Secondary | ICD-10-CM | POA: Diagnosis not present

## 2019-09-05 NOTE — Progress Notes (Signed)
Family Medicine Teaching Service Daily Progress Note Intern Pager: [phone]  Patient name: Holly Mueller Medical record number: [id number] Date of birth: 11-Jan-1966 Age: 54 y.o. Gender: female  Primary Care Provider: Cleophas Dunker, DO Consultants: CCM Code Status: DNR  Pt Overview and Major Events to Date:  4/22 Admitted, COVID/FLU/RVP negative 4/23 CCM consulted, CXR: bilateral mixed interstitial and alveolar infiltrates  4/24: Abx discontinued, transitioned to DNR status, moved to progressive floor, placed on BIPAP, palliative consulted  Assessment and Plan: Heavyn Yearsley is a 54 y.o. female presenting with SOB secondary to exacerbation of her chronic lung disease. 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  Acute on chronic hypoxic respiratory failure  Known ILD/Idiopathic Pulmonary Autoimmune Fibrosis (baseline O2 3-4L)  Patient has significant underlying lung disease with acute worsening. Now full comfort care.  -Continue BiPAP/NRB/O2 PRN -CCM consulted, appreciate recommendations -Continue morphine per palliative -Ativan, haldol PRN  Acute left DVT Heparin d/c d/t EOLC.   Diarrhea, acute: Resolved No BMs in last 24 hrs - continue to monitor  DM Last gluc on BMP was 204.   Hepatic cirrhosis secondary to primary biliary cholangitis: chronic, stable Right upper quadrant ultrasound limited due to body habitus and bowel gas, cirrhosis without definite focal hepatic abnormality.  Home meds: Ursodiol 381m TID  -discontinue home medication   Hypothyroidism: chronic, stable Home meds include Synthroid 250 MCG's daily. -Discontinue home medication  Chronic pancytopenia: improving WBC 5, RBC 4, plt 108.  GERD Continue home Protonix 428m FEN/GI:  Regular diet Prophylaxis: Lovenox  Disposition:  Full comfort care; prognosis is poor  Subjective:  Not verbally responsive at this time. No acute events overnight.   Objective: Temp:  [97.6 F (36.4 C)] 97.6 F (36.4 C) (04/26 1946) Pulse Rate:  [171] 171 (04/26 1946) Resp:  [13] 13 (04/26 1946) BP: (128)/(68) 128/68 (04/26 1946) SpO2:  [89 %] 89 % (04/26 1946)  Physical Exam:   General: Appears unwell, no acute distress. Age appropriate. Cardiac: RRR, normal heart sounds, no murmurs Respiratory: CTAB, reduced effort Neuro: not alert  Laboratory: Recent Labs  Lab 09/02/19 0825 09/03/19 0806 09/04/19 0713  WBC 4.1 3.0* 5.0  HGB 9.3* 8.5* 10.1*  HCT 32.3* 28.5* 35.0*  PLT 90* 76* 108*   Recent Labs  Lab 09/01/19 0338 09/01/19 1231 09/02/19 0825 09/03/19 0310 09/04/19 0713  NA 136   < > 137 135 139  K 4.2   < > 3.7 4.4 4.2  CL 108  --  105 102 101  CO2 18*  --  _0 BUN 10  --  13 17 29*  CREATININE 0.75  --  0.66 0.77 0.80  CALCIUM 8.1*  --  8.7* 8.4* 8.7*  PROT 6.6  --  7.8  --   --   BILITOT 1.1  --  2.2*  --   --   ALKPHOS 122  --  99  --   --   ALT 23  --  24  --   --   AST 33  --  32  --   --   GLUCOSE 372*  --  142* 247* 204*   < > = values in this interval not displayed.    Imaging/Diagnostic Tests: No new imaging.   AuGerlene FeeDO 09/05/2019, 8:06 AM PGY-1, [facility] pager: [phone] pages welcome

## 2019-09-05 NOTE — Progress Notes (Signed)
Sister Cecille Rubin asked that patient be transitioned to nasal canula. Spoke with Dr. Rowe Pavy to make aware and get recommendation for flow rate 4-5 l/min. Patient given 5 mg morphine at time of transition and then additional 5 mg dose 5 minutes later while family in room. Oral care performed and face washed. Patient appears comfortable at this time. Family at bedside. Payton Emerald, RN

## 2019-09-05 NOTE — Patient Instructions (Signed)
Visit Information  Goals Addressed            This Visit's Progress   . I would like to get a hadicap placard (pt-stated)       CARE PLAN ENTRY (see longtitudinal plan of care for additional care plan information)  Current Barriers:  . Chronic Disease Management support and education needs related to DME needs in a patient with RLD and cirrhosis of the liver.  Nurse Case Manager Clinical Goal(s):  Marland Kitchen Over the next 30 days, patient will verbalize understanding of plan  Interventions:  . Evaluation of current treatment plan  and patient's adherence to plan as established by provider. [name] with [employer] regarding Humidified Air.  The PCP put in an order.  They have processed the order.  The patient called [employer] today and the humidifier will be mailed to her. . Discussed plans with patient for ongoing care management follow up and provided patient with direct contact information for care management team . Let the patient know that I plan to check on a Rollator for her  and also a hadi cap placard for her to fill out and bring back to the office. . She also wants me to see If I can find about about a low cost medical alert bractlet. . 08/07/19 . Spoke with the patient and told her that I have sent a message to her provider for an order for a rollator and will send it to [employer]. . I have put the  form for Handicap placard at the front office for her to get fill out and sign . She states that she has received the humidified air for her oxygen and it has helped with the nose bleeds.  They have cut down. . I am still in the process of looking for a low cost medical alert bracelet. . 08/24/19 . Patient states that she went to the beach for the weekend and had problems with her sinus.  She states that she has been feeling drained . She states that she has received her rollator , and she has the handicap placard paper work all she has to do now is go and pick them up. . We have one last thing  that we are working on [facility] will try to help her find a low cost alert bracelet    Patient Self Care Activities:  . Patient verbalizes understanding of plan  . Self administers medications as prescribed . Attends all scheduled provider appointments . Calls pharmacy for medication refills . Calls provider office for new concerns or questions . Unable to independently self manage DME services  Please see past updates related to this goal by clicking on the "Past Updates" button in the selected goal         Ms. Hossain was given information about Care Management services today including:  1. Care Management services include personalized support from designated clinical staff supervised by her physician, including individualized plan of care and coordination with other care providers 2. 24/7 contact phone numbers for assistance for urgent and routine care needs. 3. The patient may stop CCM services at any time (effective at the end of the month) by phone call to the office staff.  Patient agreed to services and verbal consent obtained.   The patient verbalized understanding of instructions provided today and declined a print copy of patient instruction materials.   The care management team will reach out to the patient again over the next 21 days.  The patient has been provided with contact information for the care management team and has been advised to call with any health related questions or concerns.   Lazaro Arms RN, BSN, [facility] Care Management Coordinator [place] Phone: [phone] Fax: [phone]

## 2019-09-05 NOTE — Progress Notes (Signed)
Daily Progress Note   Patient Name: Holly Mueller       Date: 09/05/2019 DOB: [birth year]  Age: 54 y.o. MRN#: [id number] Attending Physician: Dickie La, MD Primary Care Physician: Cleophas Dunker, DO Admit Date: 08/20/2019  Reason for Consultation/Follow-up: Establishing goals of care and Terminal Care    Subjective/GOC: Patient appears to be actively dying but comfortable on morphine infusion. Unresponsive. Shallow, irregular respiratory pattern. No s/s of pain or discomfort. No facial grimacing.     Sister, Cecille Rubin at bedside. Discussed comfort focused care plan, explaining that [name] is transitioning and appears to be getting closer to EOL with lethargy and change in respiratory pattern compared to yesterday. Cecille Rubin understands and shares that multiple family members were able to visit yesterday. Discussed NRB mask and that [name] could pass with NRB mask but also that this could prolong the process. Explained recommendation for transition off NRB mask to nasal cannula, but also being prepared for 'anything to happen at any time.' once NRB mask is removed. Explained focus on comfort medications instead of numbers. RN at bedside discontinuing cardiac monitor. Cecille Rubin is going to call the family and discuss removal of NRB mask and have family visit if desired prior to this transition. Cecille Rubin does not wish for this to be prolonged for her sister. Answered questions. Emotional/spiritual support provided. Cecille Rubin has PMT contact information.   Length of Stay: 4   Vital Signs: BP 128/68 (BP Location: Left Arm)   Pulse (!) 171   Temp 97.6 F (36.4 C) (Axillary)   Resp 13   Ht _0  (1.676 m)   Wt 111.1 kg   LMP  (LMP Unknown)   SpO2 (!) 89%   BMI 39.54 kg/m  SpO2: SpO2: (!) 89 % O2 Device:  O2 Device: NRB O2 Flow Rate: O2 Flow Rate (L/min): 15 L/min       Palliative Assessment/Data: 10%     Palliative Care Plan   Patient Profile/HPI:  54 y.o. female  with past medical history of cirrhosis secondary to [place], seizures, DM, CKD, arthritis, anxiety, and chronic leukopenia, and ILD with recurrent hospitalizations for respiratory failure who was admitted on 08/30/2019 with an exacerbation of her chronic lung disease. She went to the ICU but after pulmonary evaluation and discussion opted to be DNR.   Assessment: Acute  on chronic respiratory failure. Chronic interstitial lung disease Acute on chronic diastolic heart failure Pulmonary edema Hepatic cirrhosis secondary to primary biliary cholangitis Dyspnea Anxiety state  Recommendations/Plan:  Transitioned to comfort focused care plan 4/26.  Morphine gtt initiated. Comfort meds on MAR.   Comfort feeds per patient/family request.  Chaplain as needed.  Unrestricted visitor access as patient is nearing the end of her life.  Discussed recommendation for transition off NRB mask to nasal cannula when family is ready (as this could prolong the process). Sister to call other family members. Discussed with RN to transition to nasal cannula when requested by family. I have prepared sister for 'anything to happen at any time' when off NRB mask.   Code Status:  DNR  Prognosis:   Poor prognosis: days if not hours  Discharge Planning:  To Be Determined: anticipate hospital death  Care plan was discussed with RN, patient's sister Cecille Rubin)  Thank you for allowing the Palliative Medicine Team to assist in the care of this patient.  Total time spent:  64mn     Greater than 50% of this time was spent counseling and coordinating care related to the above assessment and plan.   MIhor Dow DNP, FNP-C Palliative Medicine Team  Phone: [phone]: [phone] Please contact Palliative MedicineTeam phone at [phone] for questions and concerns between 7 am - 7 pm.   Please see AMION for individual provider pager numbers.

## 2019-09-05 NOTE — Chronic Care Management (AMB) (Signed)
Care Management   Follow Up Note   09/05/2019 Name: Holly Mueller MRN: [id number] DOB: Apr 12, 1966  Referred by: Cleophas Dunker, DO Reason for referral : Care Coordination (Care maangement RNCM F/U on Rollator)   Holly Mueller is a 54 y.o. year old female who is a primary care patient of Cleophas Dunker, DO. The care management team was consulted for assistance with care management and care coordination needs.    Review of patient status, including review of consultants reports, relevant laboratory and other test results, and collaboration with appropriate care team members and the patient's provider was performed as part of comprehensive patient evaluation and provision of chronic care management services.    SDOH (Social Determinants of Health) assessments performed: No See Care Plan activities for detailed interventions related to [facility])     Advanced Directives: See Care Plan and Vynca application for related entries.   Goals Addressed            This Visit's Progress   . I would like to get a hadicap placard (pt-stated)       CARE PLAN ENTRY (see longtitudinal plan of care for additional care plan information)  Current Barriers:  . Chronic Disease Management support and education needs related to DME needs in a patient with RLD and cirrhosis of the liver.  Nurse Case Manager Clinical Goal(s):  Marland Kitchen Over the next 30 days, patient will verbalize understanding of plan  Interventions:  . Evaluation of current treatment plan  and patient's adherence to plan as established by provider. [name] with [employer] regarding Humidified Air.  The PCP put in an order.  They have processed the order.  The patient called [employer] today and the humidifier will be mailed to her. . Discussed plans with patient for ongoing care management follow up and provided patient with direct contact information for care management team . Let the patient know that I plan to check on a  Rollator for her  and also a hadi cap placard for her to fill out and bring back to the office. . She also wants me to see If I can find about about a low cost medical alert bractlet. . 08/07/19 . Spoke with the patient and told her that I have sent a message to her provider for an order for a rollator and will send it to [employer]. . I have put the  form for Handicap placard at the front office for her to get fill out and sign . She states that she has received the humidified air for her oxygen and it has helped with the nose bleeds.  They have cut down. . I am still in the process of looking for a low cost medical alert bracelet. . 08/24/19 . Patient states that she went to the beach for the weekend and had problems with her sinus.  She states that she has been feeling drained . She states that she has received her rollator , and she has the handicap placard paper work all she has to do now is go and pick them up. . We have one last thing that we are working on [facility] will try to help her find a low cost alert bracelet    Patient Self Care Activities:  . Patient verbalizes understanding of plan  . Self administers medications as prescribed . Attends all scheduled provider appointments . Calls pharmacy for medication refills . Calls provider office for new concerns or questions . Unable to independently  self manage DME services  Please see past updates related to this goal by clicking on the "Past Updates" button in the selected goal          The care management team will reach out to the patient again over the next 21 days.  The patient has been provided with contact information for the care management team and has been advised to call with any health related questions or concerns.   Lazaro Arms RN, BSN, [facility] Care Management Coordinator [place] Phone: [phone] Fax: [phone]

## 2019-09-07 ENCOUNTER — Telehealth: Payer: Self-pay | Admitting: Family Medicine

## 2019-09-07 ENCOUNTER — Ambulatory Visit: Payer: Medicaid Other | Admitting: Internal Medicine

## 2019-09-07 NOTE — Telephone Encounter (Signed)
Completed!  I placed it back in my inbox in the envelope that it came in.  You can just grab it out of there.  Thanks!

## 2019-09-07 NOTE — Telephone Encounter (Signed)
[employer] dropped off Death Certificate for patient. Placed in PCP'S box to e signed, once signed please return to [place].

## 2019-09-09 NOTE — Progress Notes (Signed)
Pt passed away at around 5:40 AM. Pt's sister, Shirlee More, was informed at [phone] and stated she will come up to see her sister.  The on call physician was informed at 49.

## 2019-09-09 NOTE — Progress Notes (Signed)
Paged by RN and notified that patient passed away at 0540 am. Family at bedside. Certificate of Death completed and returned to 4E   Carollee Leitz MD

## 2019-09-09 NOTE — Death Summary Note (Signed)
[facility] Death Summary  Patient name: Holly Mueller Medical record number: [id number] Date of birth: 1965-06-04 Age: 53 y.o. Gender: female Date of Admission: 09/13/19   Date of Death: 09/19/2019 Admitting Physician: Dickie La, MD  Primary Care Provider: Cleophas Dunker, DO Consultants: Pallliative, PCCM  Indication for Hospitalization: Acute hypoxic respiratory failure  Discharge Diagnoses/Problem List:  ILD Pulmonary autoimmune fibrosis DVT DM Hepatic cirrhosis Hypothyroidism Pancytopenia GERD  Disposition: Deceased.  Brief Hospital Course:  Holly Mueller is a 54 y.o. female who presented with SOB. 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  Acute on Chronic Hypoxic Respiratory Failure  Patient presented to ED via EMS with oxygen saturations in high 80s despite being on 4 liters via [place]. On exam, patient is dyspneic and unable to complete full sentences, observed to have frequent coughing fits accompanied by desaturations. Patient was afebrile but did have increased RR into low 30s. Patient with CXR findings concerning for CAP including bilateral mixed interstitial and multifocal patchy consolidative opacity throughout both lungs that may reflect multifocal pneumonia versus mixed interstitial and alveolar edema. Labs notable for leukopenia 1.7, negative COVID and negative respiratory viral panel. BNP was 88.7. Troponin level was 4 and 5. ECHO w/ LVEF 60-65% G1DD, severe left atrial enlargement.  LE ultrasound with LLE DVT; heparin was subsequently started. Patient also received 2grams of magnesium in ED. Was further treated with azithromycin and CTX. The patient was also treated for COPD exacerbation with 125 mg Solumedrol and 64m IV Lasix for 3 days course. Continued on albuterol  and duoneb treatment.  Patient's respiratory status continued to decline. She was placed on continuous BiPAP after consulting pulmonary critical care medicine. Palliative consult and deemed patient's prognosis to be poor. She changed code status to DNR and transitioned to comfort care. She was transitioned to a NRB then eventually to nasal cannula. Received a morphine gtt and ativan during these transitions. She expired 4May 11, 2021_0  peacefully with family at bedside.   Significant Procedures: N/A  Significant Labs and Imaging:  PORTABLE CHEST 1 VIEW COMPARISON:  005/05/2021IMPRESSION: Marked worsening of interstitial and airspace opacities. Suspicious for worsening infection. Correlate with any signs of heart failure.  ULTRASOUND ABDOMEN LIMITED RIGHT UPPER QUADRANT COMPARISON:  05/10/2019 ultrasound. IMPRESSION: Limited evaluation due to body habitus and bowel gas. Cirrhosis without definite focal hepatic abnormality. Unremarkable gallbladder.  ECHO IMPRESSIONS  1. Left ventricular ejection fraction, by estimation, is 60 to 65%. The  left ventricle has normal function. The left ventricle has no regional  wall motion abnormalities. Left ventricular diastolic parameters are  consistent with Grade I diastolic  dysfunction (impaired relaxation).  2. Right ventricular systolic function is normal. The right ventricular  size is mildly enlarged.  3. Left atrial size was severely dilated.  4. The mitral valve is normal in structure. Trivial mitral valve  regurgitation. No evidence of mitral stenosis.  5. The aortic valve is tricuspid. Aortic valve regurgitation is not  visualized. Moderate aortic valve stenosis. Aortic valve area, by VTI  measures 1.51 cm. Aortic valve mean gradient measures 19.5 mmHg. Aortic  valve Vmax measures 3.07 m/s.  6. The inferior vena cava is normal in size with greater than 50%  respiratory variability, suggesting right atrial pressure of 3 mmHg.    VAS [place] DVT Summary:  RIGHT:  - There is no evidence of deep vein  thrombosis in the lower extremity.  However, portions of this examination were limited- see technologist  comments above.   - No cystic structure found in the popliteal fossa.   LEFT:  - Findings consistent with acute deep vein thrombosis involving the left  peroneal veins.  - No cystic structure found in the popliteal fossa.   Gerlene Fee, DO 27-Sep-2019, 7:42 AM PGY-1, [place]

## 2019-09-09 DEATH — deceased

## 2019-09-11 ENCOUNTER — Encounter: Payer: Medicaid Other | Admitting: Physical Therapy

## 2019-09-13 ENCOUNTER — Telehealth: Payer: Medicaid Other

## 2019-11-29 ENCOUNTER — Other Ambulatory Visit: Payer: Medicaid Other

## 2019-11-29 ENCOUNTER — Ambulatory Visit: Payer: Medicaid Other | Admitting: Hematology and Oncology

## 2020-08-27 IMAGING — DX DG CHEST 2V
2 series · 2 of 2 positions shown · non-contrast
Comparison: 04/26/2018

CLINICAL DATA: Body aches, cough and fever.

EXAM:
CHEST - 2 VIEW

[chest lat]
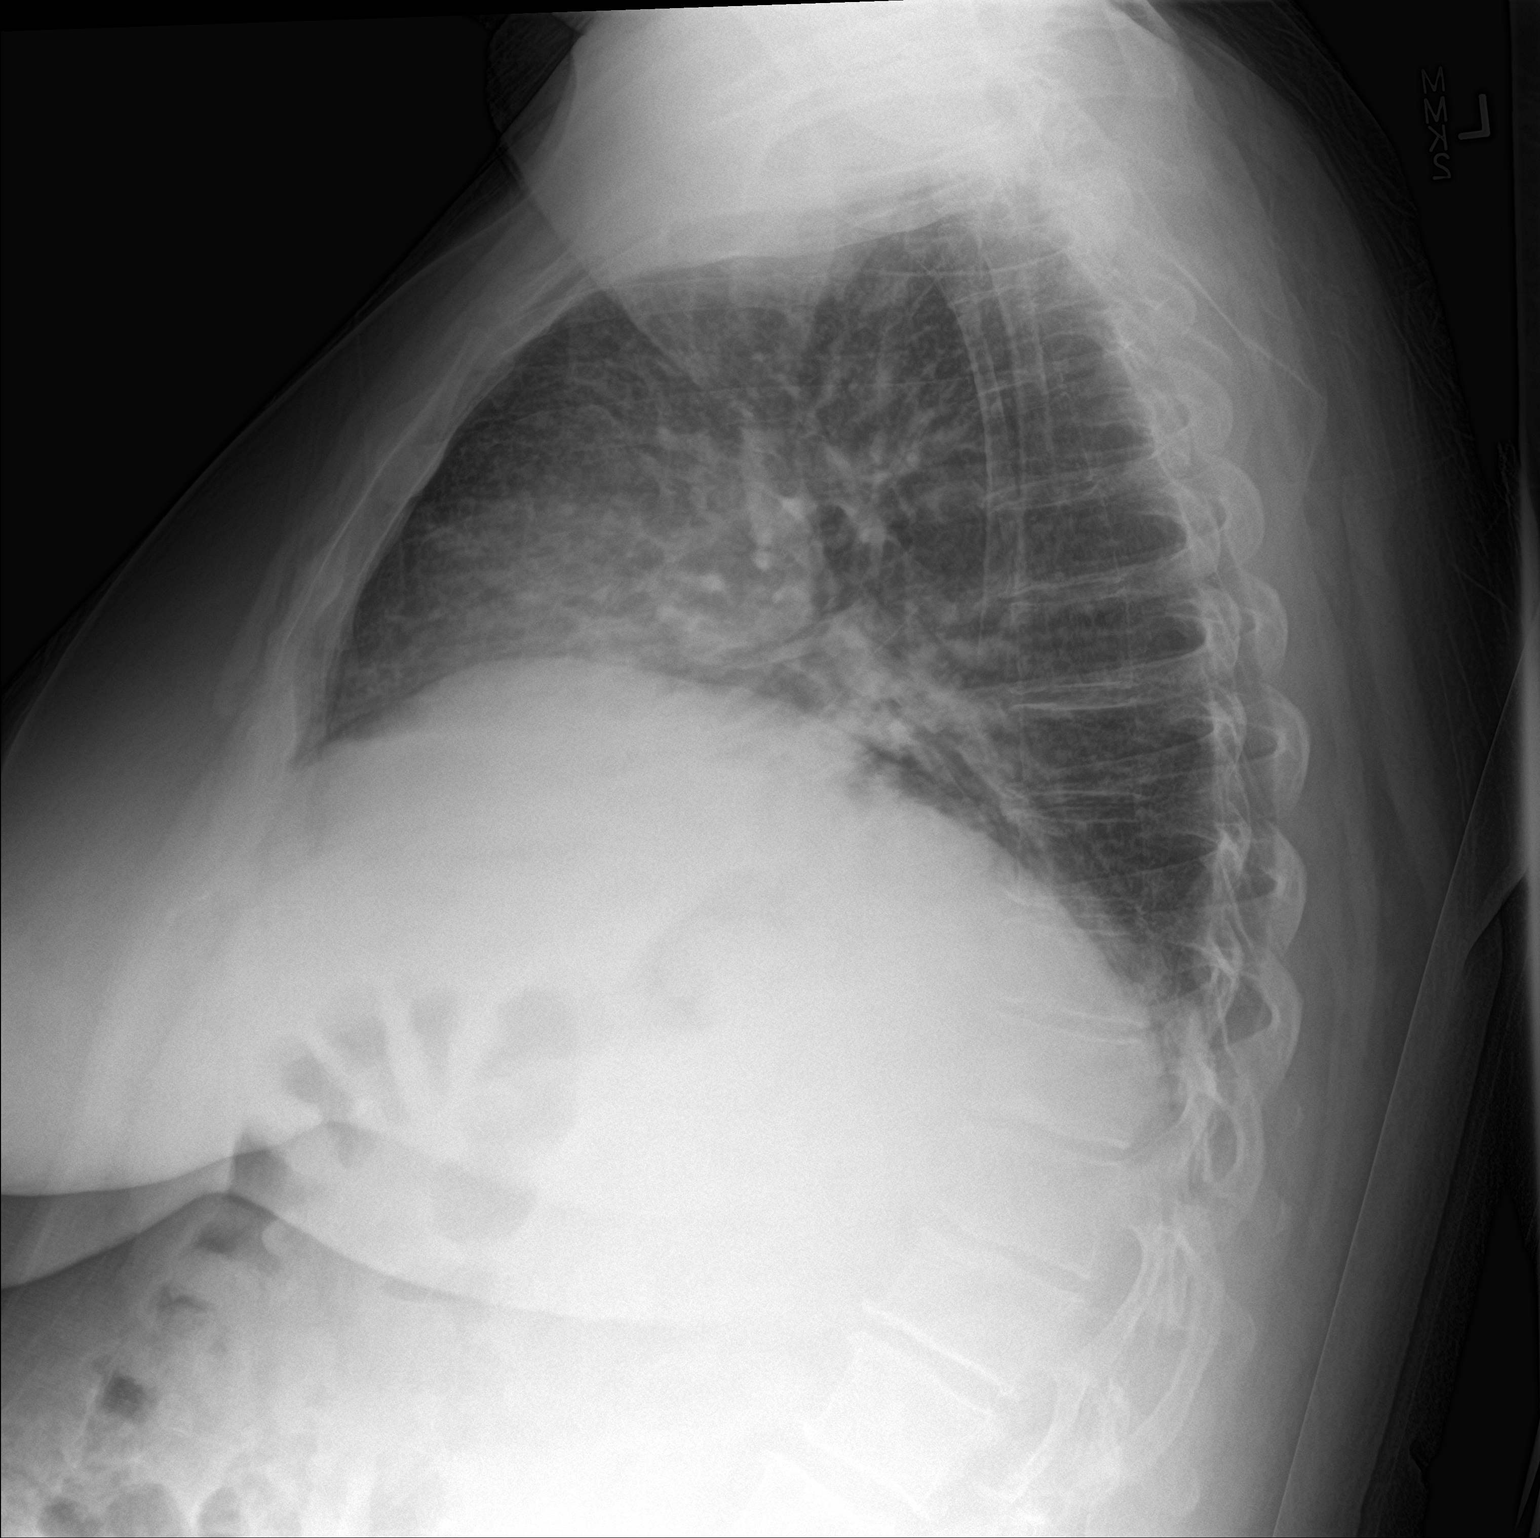

[chest ap]
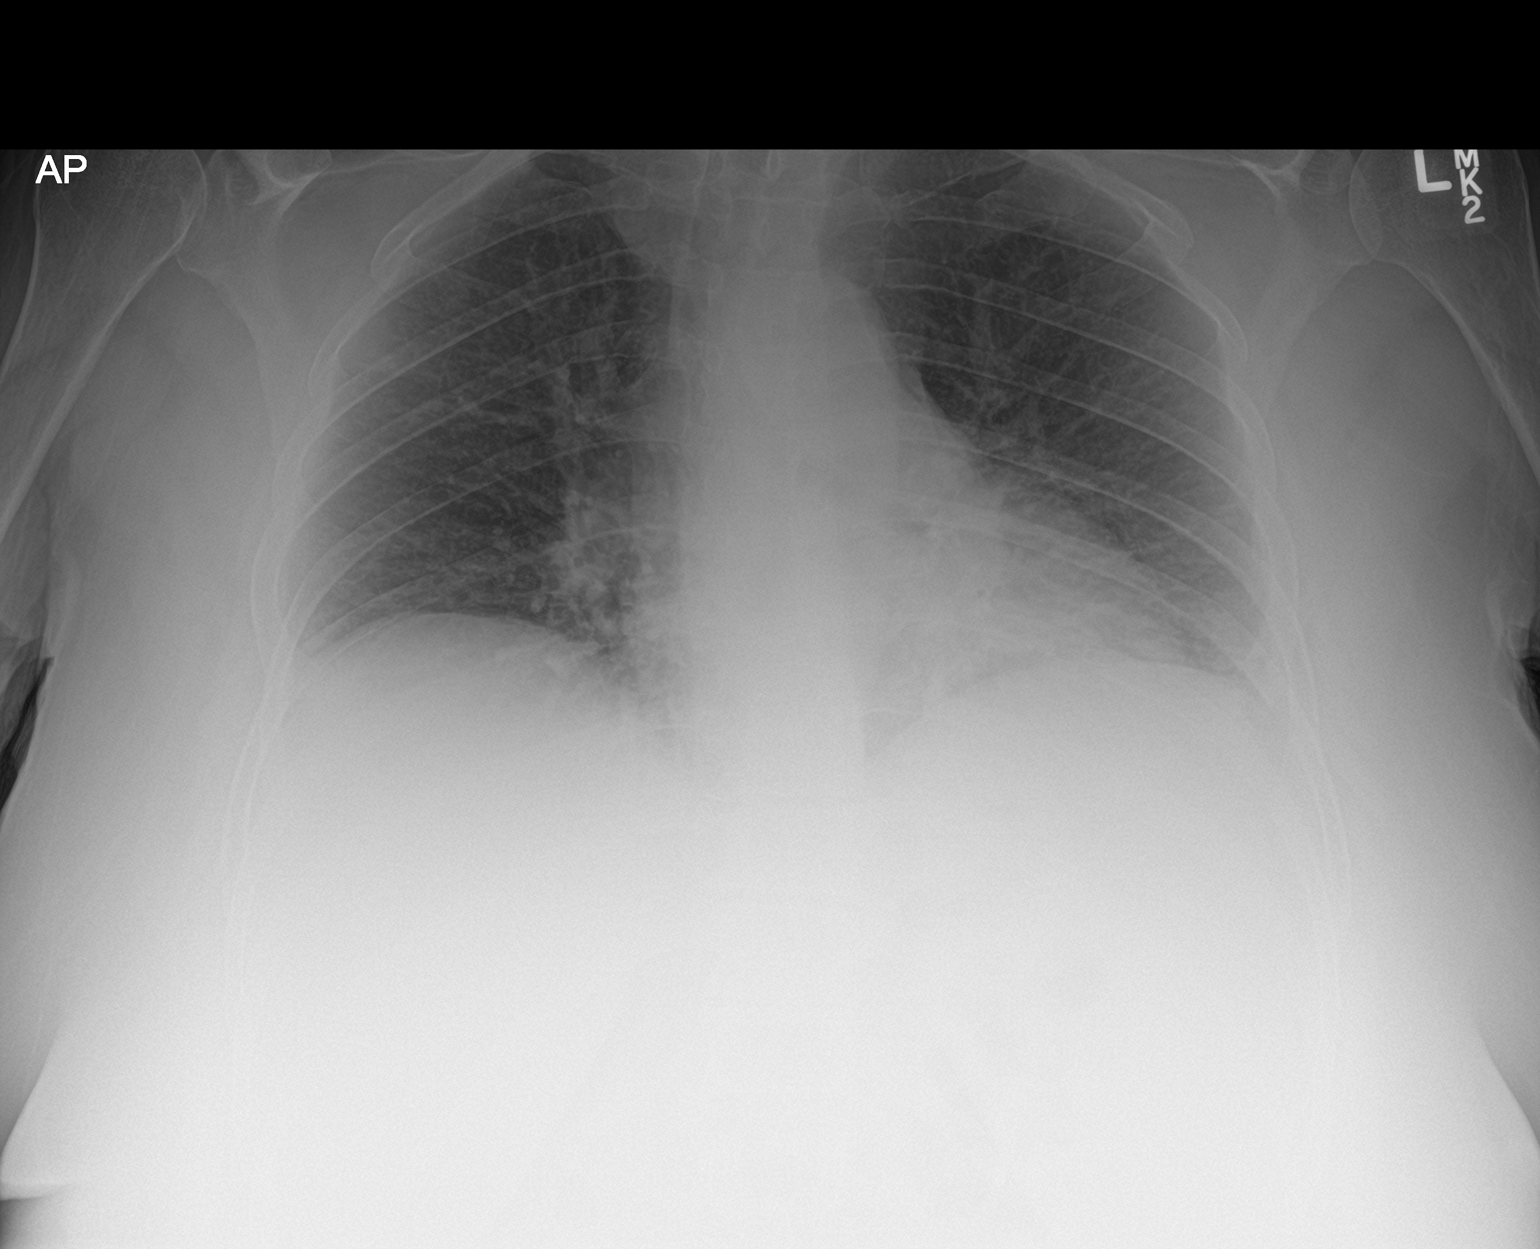

[2 of 2 positions shown; findings below may reference images not displayed]

FINDINGS: The cardiac silhouette, mediastinal and hilar contours are within
normal limits and stable. There are chronic underlying interstitial
changes most notably at the lung bases. I do not see a superimposed
infiltrate but there could be superimposed bronchitis. No pleural
effusions or worrisome pulmonary lesions. The bony thorax is intact.
IMPRESSION: 1. Chronic underlying interstitial lung disease.
2. No definite superimposed pneumonia or effusion. Could not exclude
superimposed bronchitis.

## 2020-10-29 IMAGING — DX PORTABLE ABDOMEN - 1 VIEW
1 series · 1 of 1 positions shown · non-contrast
Comparison: CT abdomen 05/26/2012

CLINICAL DATA: Diffuse abd pain and vomiting

EXAM:
PORTABLE ABDOMEN - 1 VIEW

[abdomen kub]
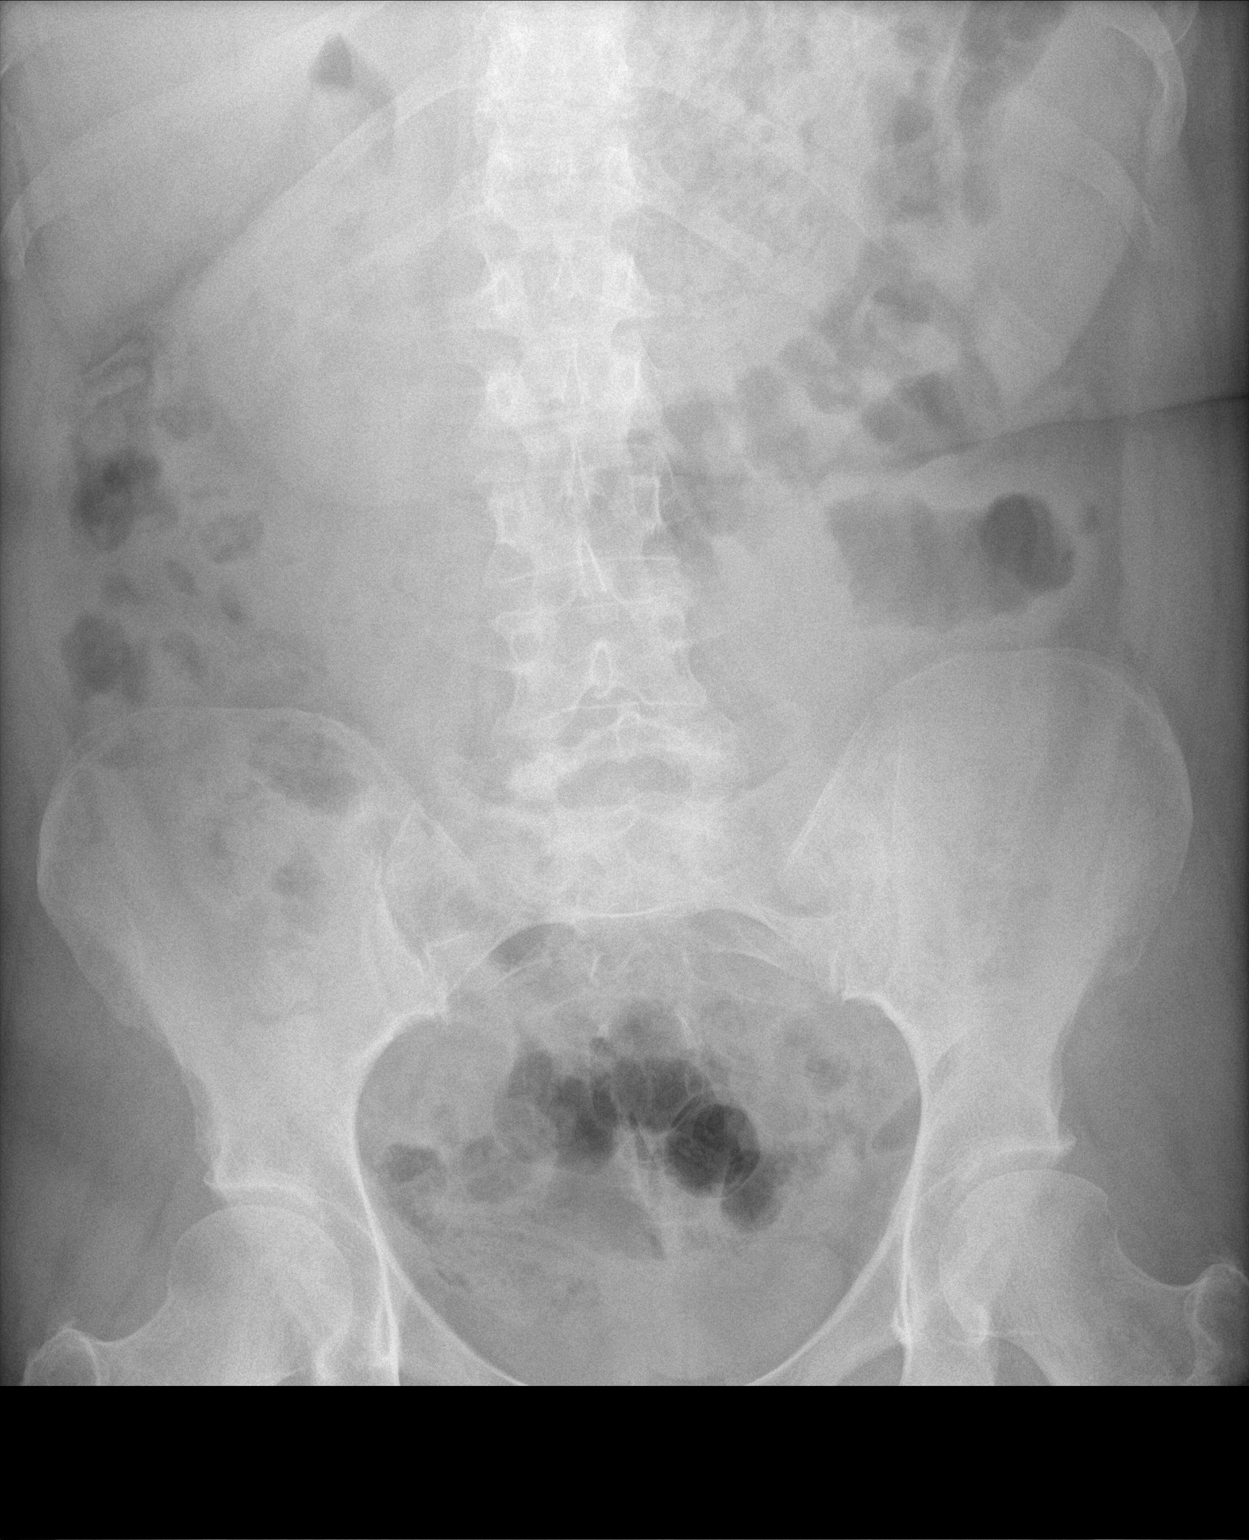

[1 of 1 positions shown; findings below may reference images not displayed]

FINDINGS: No dilated loops of large or small bowel. Gas and stool in the
rectum. No pathologic calcifications. No organomegaly. No acute
osseous abnormality
IMPRESSION: No acute abdominal findings.

## 2021-01-20 IMAGING — DX PORTABLE CHEST - 1 VIEW
1 series · 1 of 1 positions shown · non-contrast
Comparison: 09/22/2018, 08/27/2018

CLINICAL DATA: Shortness of breath with vomiting

EXAM:
PORTABLE CHEST 1 VIEW

[chest ap]
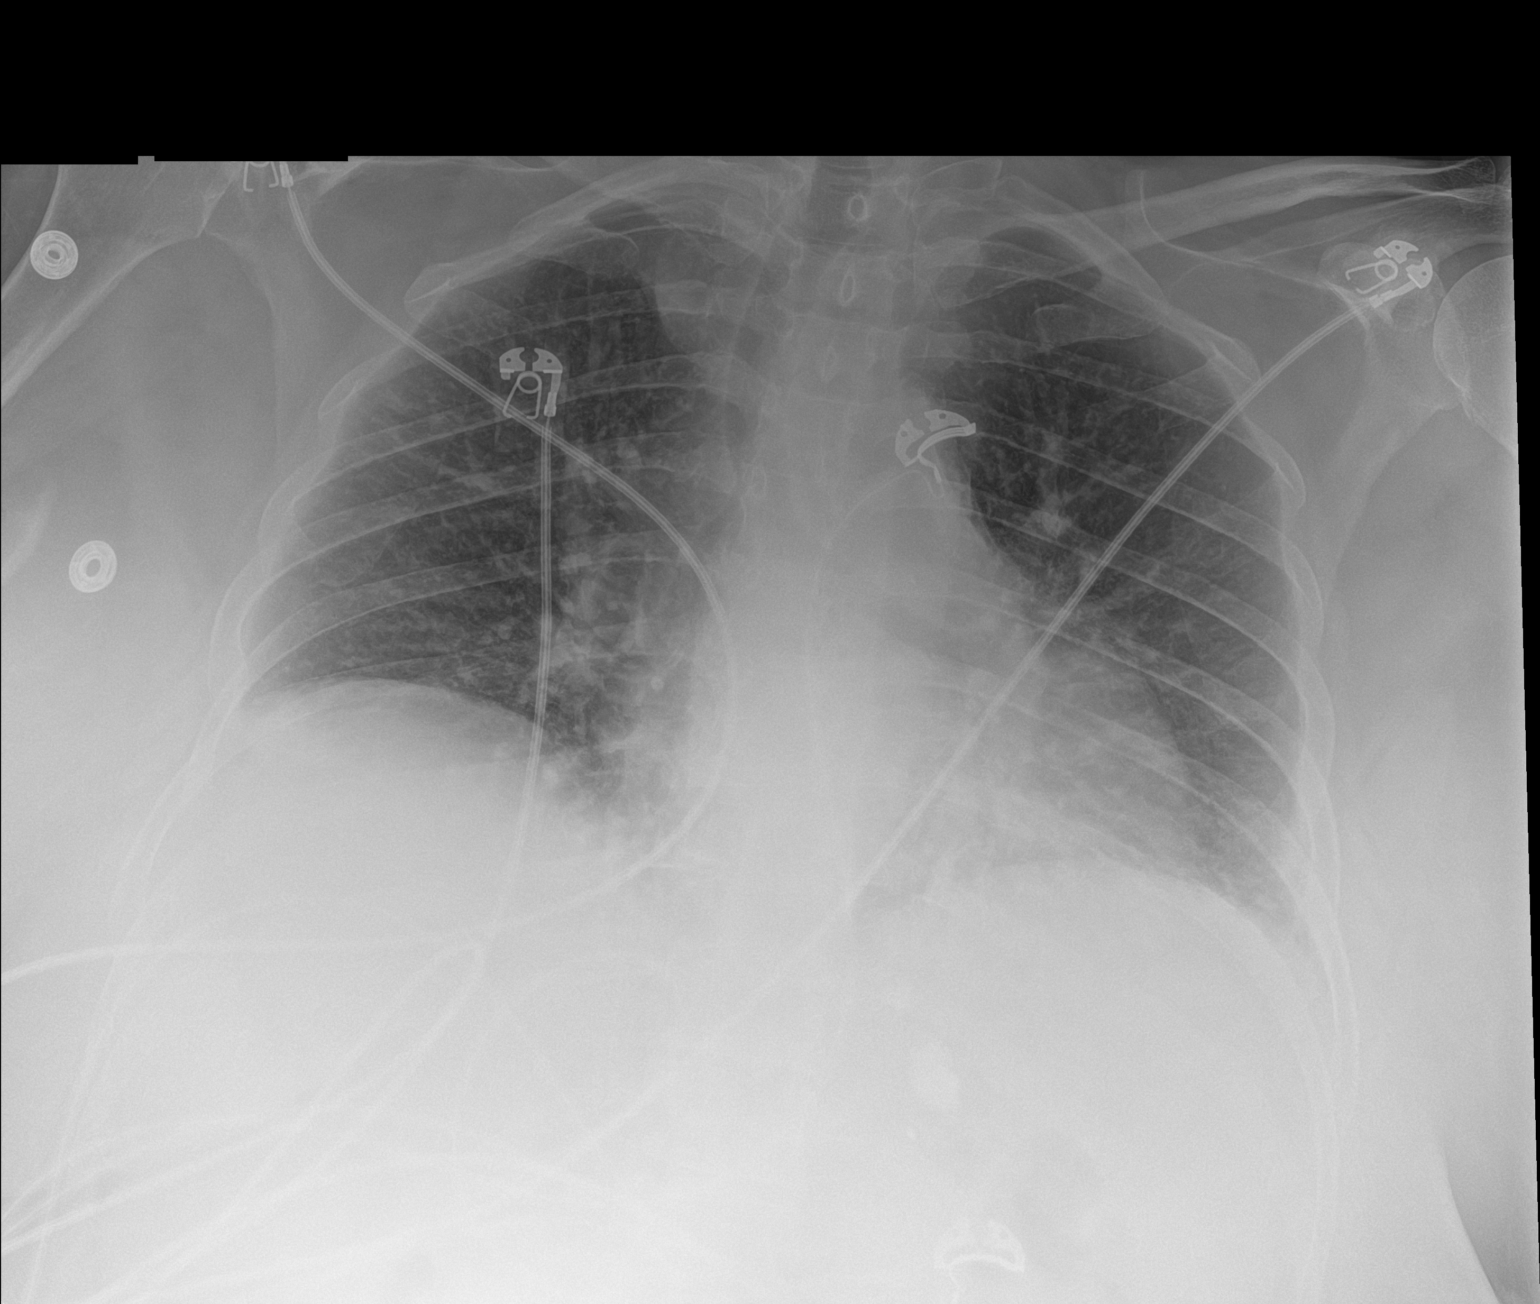

[1 of 1 positions shown; findings below may reference images not displayed]

FINDINGS: Low lung volumes. Streaky atelectasis or minimal infiltrate at the
left base. Stable cardiomediastinal silhouette. No pneumothorax.
Mild chronic interstitial lung disease.
IMPRESSION: Low lung volumes. Streaky atelectasis or minimal infiltrate at the
left base.

## 2021-07-10 IMAGING — US US ABDOMEN COMPLETE
1 series · 13 of 25 positions shown · non-contrast
Comparison: Hepatic duplex ultrasound September 24, 2018; CT abdomen and
pelvis December 09, 2018

CLINICAL DATA: Hepatic cirrhosis

EXAM:
ABDOMEN ULTRASOUND COMPLETE

[Series 1: us abdomen complete · 13 of 86 slices shown]
[im 1/86]
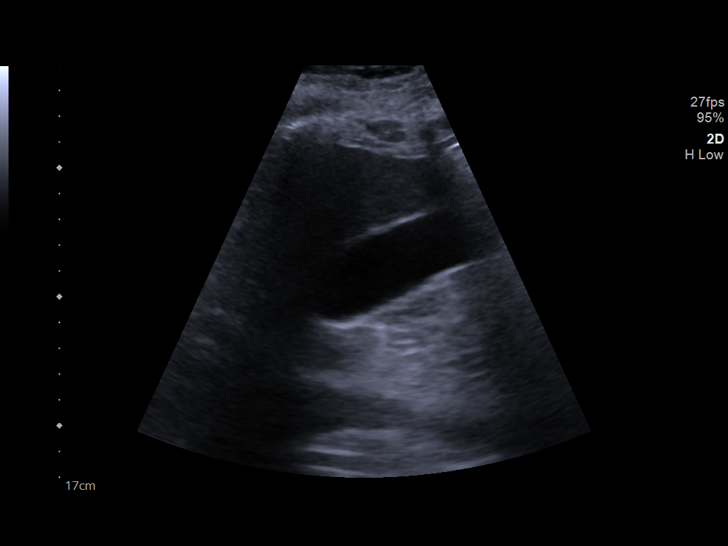
[im 8/86]
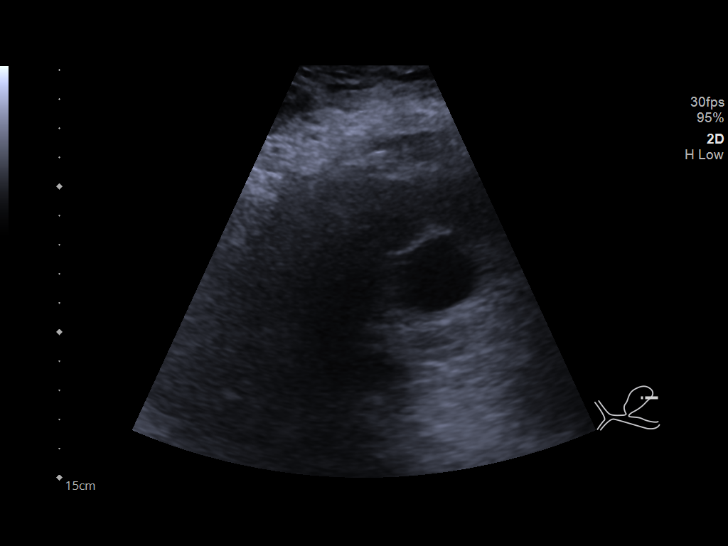
[im 15/86]
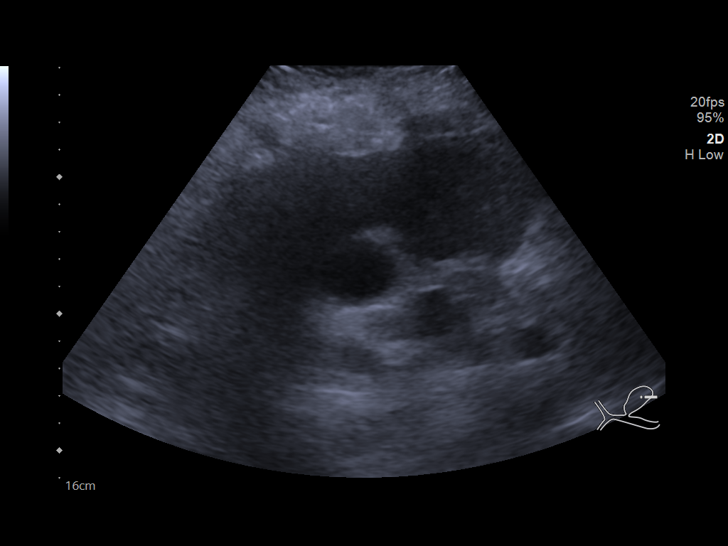
[im 22/86]
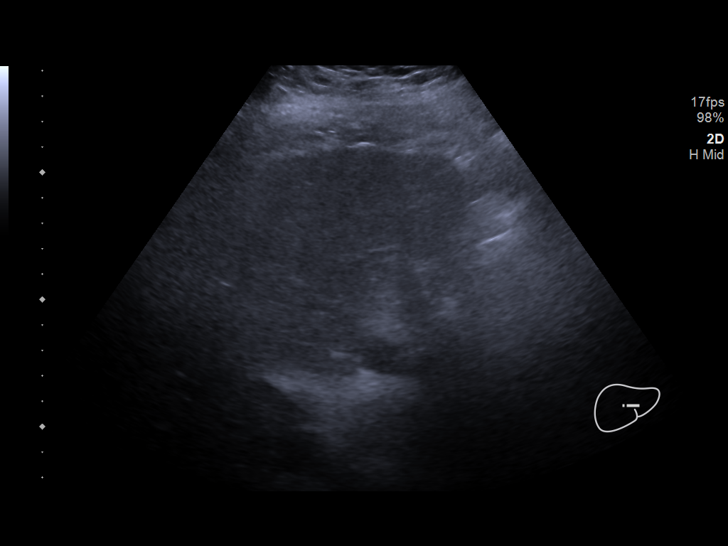
[im 29/86]
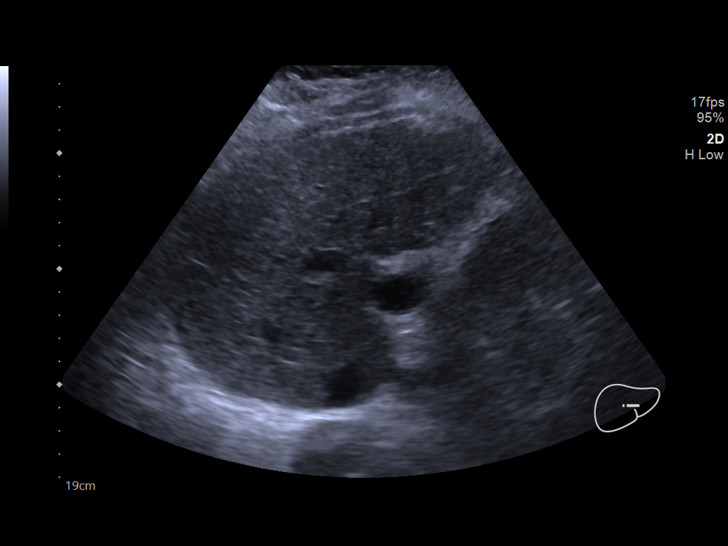
[im 36/86]
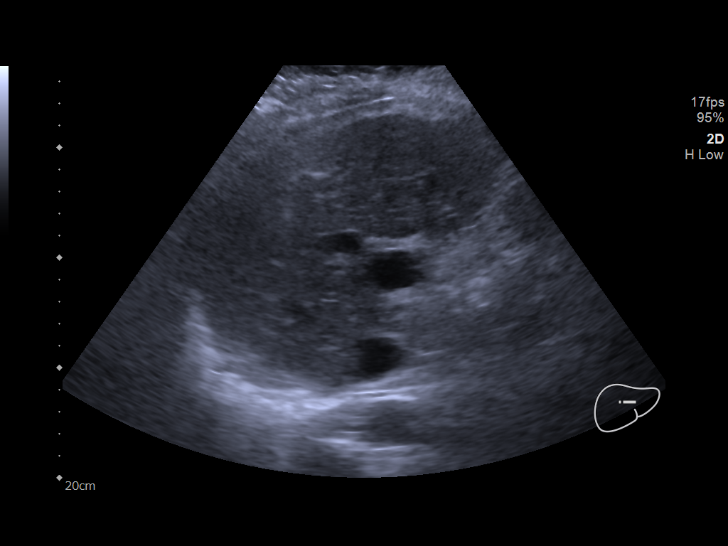
[im 43/86]
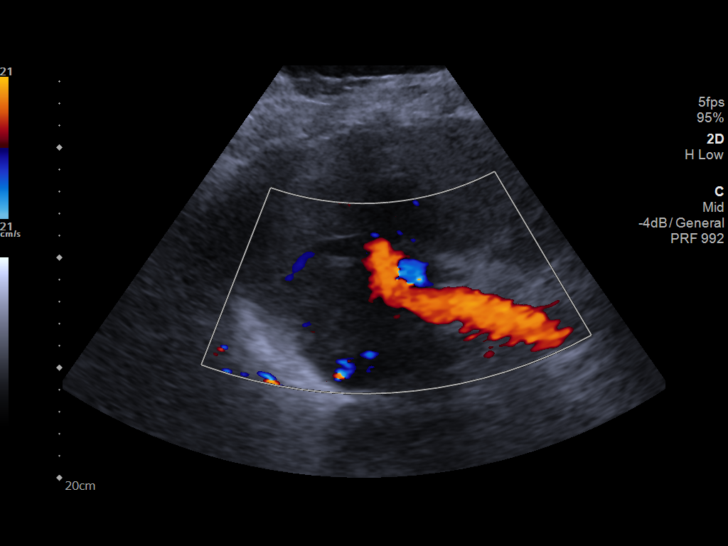
[im 50/86]
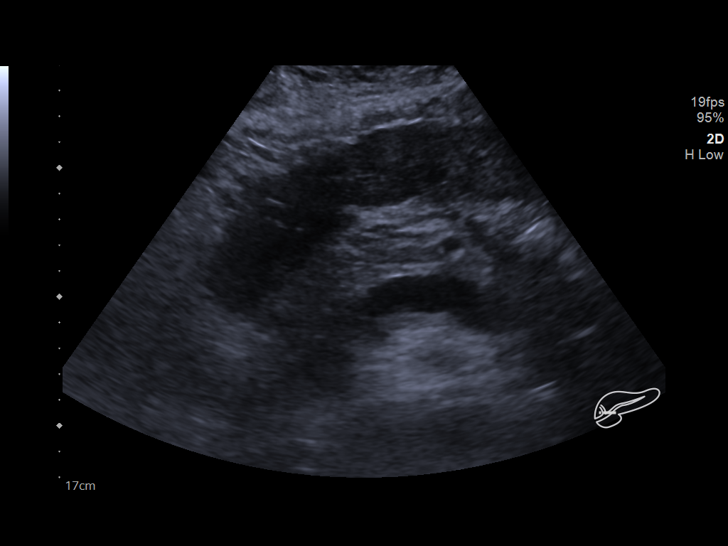
[im 57/86]
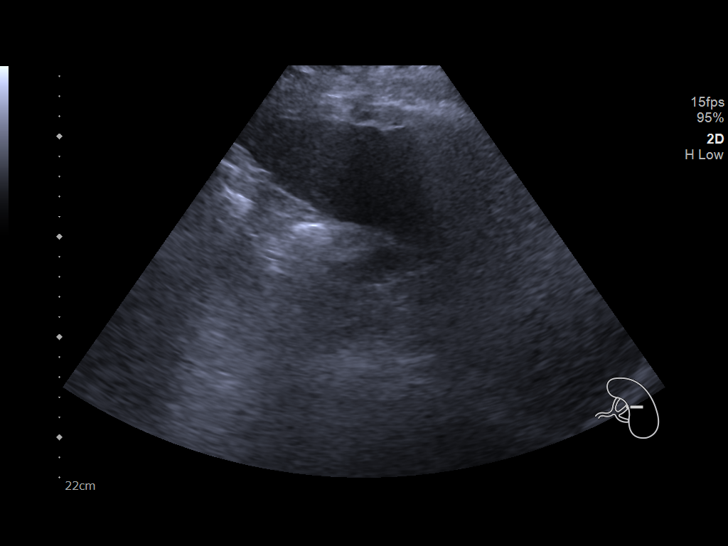
[im 64/86]
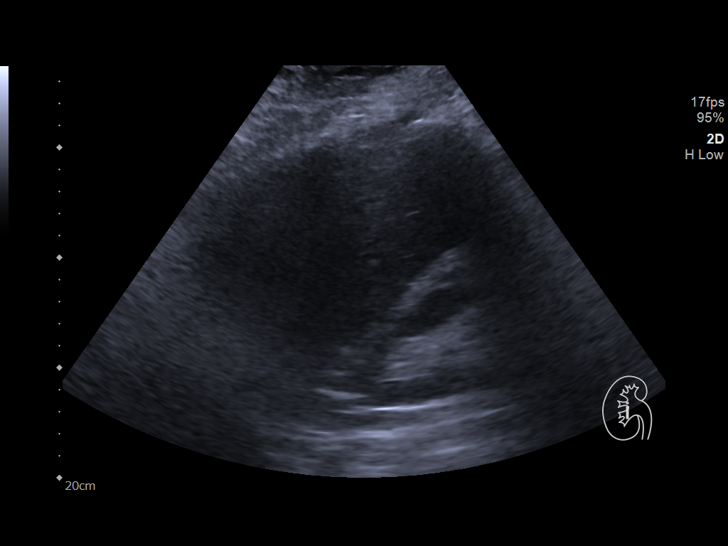
[im 71/86]
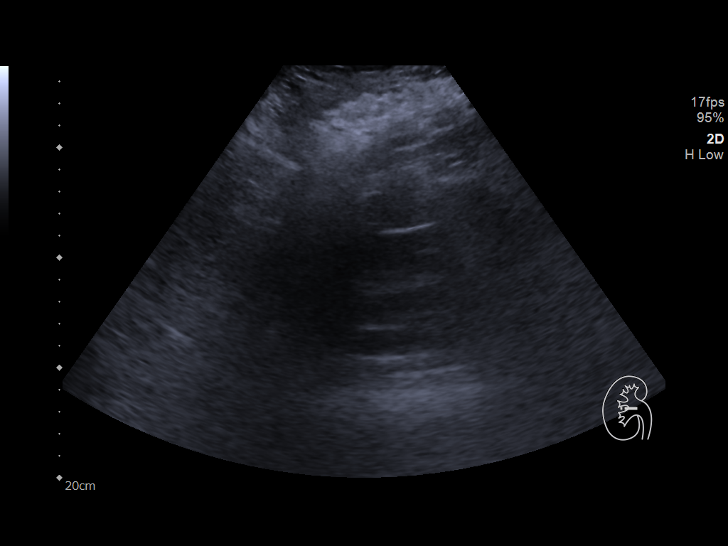
[im 78/86]
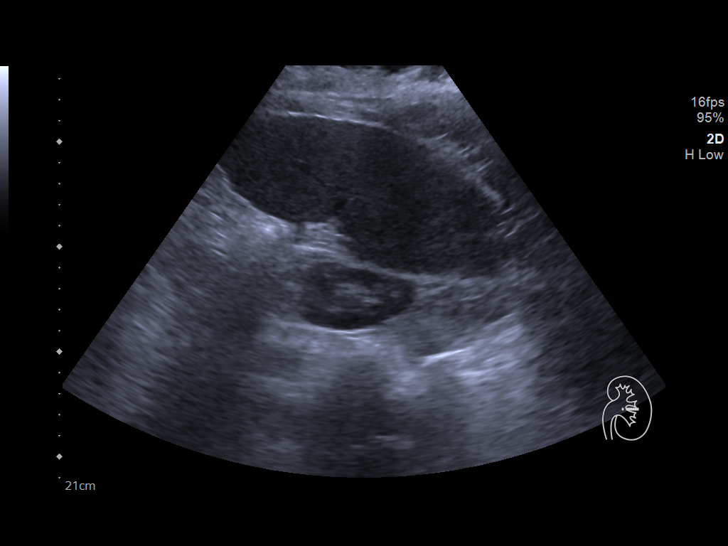
[im 86/86]
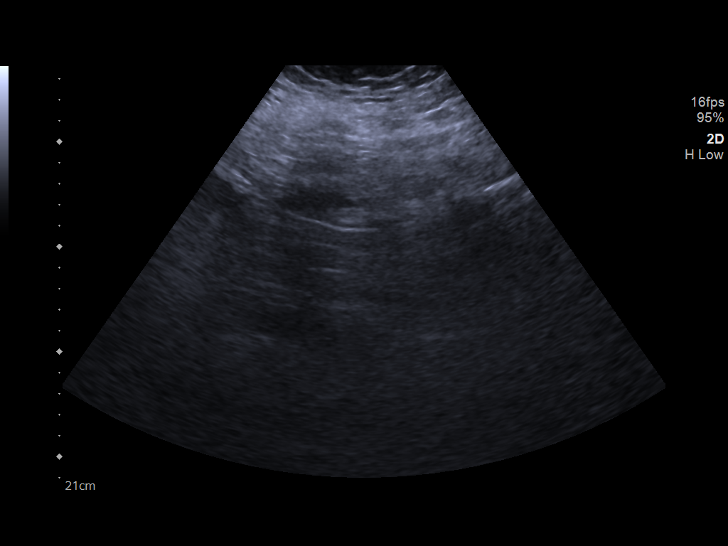

[13 of 25 positions shown; findings below may reference images not displayed]

FINDINGS: Gallbladder: No gallstones or wall thickening visualized. There is
no pericholecystic fluid. No sonographic Murphy sign noted by
sonographer.

Common bile duct: Diameter: 7 mm, upper normal in size. No
appreciable intrahepatic, common hepatic, or common bile duct
dilatation is demonstrable.

Liver: No focal lesion identified. Liver echogenicity is increased
diffusely. The liver echotexture is coarse with a nodular contour.
Portal vein is patent on color Doppler imaging with normal direction
of blood flow towards the liver.

IVC: No abnormality visualized.

Pancreas: Visualized portion unremarkable. Portions of pancreas
obscured by gas.

Spleen: Spleen measures 18.9 x 22.5 x 9.6 cm with a measured splenic
volume of 2,139 cubic cm. No focal splenic lesions are identified.
No perisplenic fluid.

Right Kidney: Length: 13.4 cm. Echogenicity within normal limits. No
mass or hydronephrosis visualized.

Left Kidney: Length: 14.1 cm. Echogenicity within normal limits. No
mass or hydronephrosis visualized. There is scarring along the upper
to mid kidney on the left.

Abdominal aorta: No aneurysm visualized.

Other findings: No evident ascites.
IMPRESSION: 1. The liver has a nodular contour with coarse, somewhat
inhomogeneous and overall increased echotexture. This appearance is
indicative of hepatic cirrhosis. While no focal liver lesions are
evident on this study, it must be cautioned that the sensitivity of
ultrasound for detection of focal liver lesions is diminished
significantly in this circumstance.

2.  Splenomegaly.

3.  Scarring in portions of left kidney.

4. Portions of pancreas obscured by gas. Visualized portions of
pancreas appear unremarkable.

## 2023-12-14 NOTE — Progress Notes (Signed)
 This encounter was created in error - please disregard.
